# Patient Record
Sex: Female | Born: 1940
Health system: Southern US, Community
[De-identification: ages and names within clinical notes are randomized; demographics above are authoritative.]

## PROBLEM LIST (undated history)

## (undated) DIAGNOSIS — Z952 Presence of prosthetic heart valve: Principal | ICD-10-CM

## (undated) DIAGNOSIS — E1142 Type 2 diabetes mellitus with diabetic polyneuropathy: Secondary | ICD-10-CM

## (undated) DIAGNOSIS — I639 Cerebral infarction, unspecified: Secondary | ICD-10-CM

## (undated) DIAGNOSIS — G43909 Migraine, unspecified, not intractable, without status migrainosus: Secondary | ICD-10-CM

## (undated) DIAGNOSIS — M1990 Unspecified osteoarthritis, unspecified site: Secondary | ICD-10-CM

## (undated) DIAGNOSIS — N289 Disorder of kidney and ureter, unspecified: Secondary | ICD-10-CM

## (undated) DIAGNOSIS — I679 Cerebrovascular disease, unspecified: Secondary | ICD-10-CM

## (undated) DIAGNOSIS — Z8673 Personal history of transient ischemic attack (TIA), and cerebral infarction without residual deficits: Secondary | ICD-10-CM

## (undated) DIAGNOSIS — R011 Cardiac murmur, unspecified: Secondary | ICD-10-CM

## (undated) DIAGNOSIS — E11319 Type 2 diabetes mellitus with unspecified diabetic retinopathy without macular edema: Secondary | ICD-10-CM

## (undated) DIAGNOSIS — K5792 Diverticulitis of intestine, part unspecified, without perforation or abscess without bleeding: Secondary | ICD-10-CM

## (undated) DIAGNOSIS — R51 Headache: Secondary | ICD-10-CM

## (undated) DIAGNOSIS — I2510 Atherosclerotic heart disease of native coronary artery without angina pectoris: Secondary | ICD-10-CM

## (undated) DIAGNOSIS — K219 Gastro-esophageal reflux disease without esophagitis: Secondary | ICD-10-CM

## (undated) DIAGNOSIS — N189 Chronic kidney disease, unspecified: Secondary | ICD-10-CM

## (undated) DIAGNOSIS — I209 Angina pectoris, unspecified: Secondary | ICD-10-CM

## (undated) DIAGNOSIS — G459 Transient cerebral ischemic attack, unspecified: Secondary | ICD-10-CM

## (undated) DIAGNOSIS — I10 Essential (primary) hypertension: Secondary | ICD-10-CM

## (undated) DIAGNOSIS — E785 Hyperlipidemia, unspecified: Secondary | ICD-10-CM

## (undated) DIAGNOSIS — I350 Nonrheumatic aortic (valve) stenosis: Secondary | ICD-10-CM

## (undated) DIAGNOSIS — E119 Type 2 diabetes mellitus without complications: Secondary | ICD-10-CM

## (undated) DIAGNOSIS — F329 Major depressive disorder, single episode, unspecified: Secondary | ICD-10-CM

## (undated) DIAGNOSIS — I509 Heart failure, unspecified: Secondary | ICD-10-CM

## (undated) DIAGNOSIS — F32A Depression, unspecified: Secondary | ICD-10-CM

## (undated) DIAGNOSIS — E669 Obesity, unspecified: Secondary | ICD-10-CM

## (undated) HISTORY — DX: Personal history of transient ischemic attack (TIA), and cerebral infarction without residual deficits: Z86.73

## (undated) HISTORY — DX: Type 2 diabetes mellitus without complications: E11.9

## (undated) HISTORY — PX: ABDOMINAL HYSTERECTOMY: SHX81

## (undated) HISTORY — DX: Essential (primary) hypertension: I10

## (undated) HISTORY — DX: Cerebrovascular disease, unspecified: I67.9

## (undated) HISTORY — DX: Headache: R51

## (undated) HISTORY — DX: Type 2 diabetes mellitus with diabetic polyneuropathy: E11.42

## (undated) HISTORY — DX: Major depressive disorder, single episode, unspecified: F32.9

## (undated) HISTORY — PX: OTHER SURGICAL HISTORY: SHX169

## (undated) HISTORY — DX: Diverticulitis of intestine, part unspecified, without perforation or abscess without bleeding: K57.92

## (undated) HISTORY — DX: Obesity, unspecified: E66.9

## (undated) HISTORY — PX: COLONOSCOPY: SHX174

## (undated) HISTORY — DX: Depression, unspecified: F32.A

## (undated) HISTORY — DX: Migraine, unspecified, not intractable, without status migrainosus: G43.909

## (undated) HISTORY — DX: Type 2 diabetes mellitus with unspecified diabetic retinopathy without macular edema: E11.319

## (undated) HISTORY — DX: Hyperlipidemia, unspecified: E78.5

---
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
## 1997-06-25 ENCOUNTER — Encounter: Admission: RE | Admit: 1997-06-25 | Discharge: 1997-09-23 | Payer: Self-pay | Admitting: Endocrinology

## 1997-09-14 ENCOUNTER — Emergency Department (HOSPITAL_COMMUNITY): Admission: EM | Admit: 1997-09-14 | Discharge: 1997-09-14 | Payer: Self-pay | Admitting: Internal Medicine

## 1997-11-20 ENCOUNTER — Inpatient Hospital Stay (HOSPITAL_COMMUNITY): Admission: RE | Admit: 1997-11-20 | Discharge: 1997-11-24 | Payer: Self-pay | Admitting: Emergency Medicine

## 1998-06-05 ENCOUNTER — Emergency Department (HOSPITAL_COMMUNITY): Admission: EM | Admit: 1998-06-05 | Discharge: 1998-06-05 | Payer: Self-pay | Admitting: Emergency Medicine

## 1998-06-05 ENCOUNTER — Encounter: Payer: Self-pay | Admitting: Emergency Medicine

## 1998-07-17 ENCOUNTER — Emergency Department (HOSPITAL_COMMUNITY): Admission: EM | Admit: 1998-07-17 | Discharge: 1998-07-17 | Payer: Self-pay

## 1998-11-17 ENCOUNTER — Encounter (INDEPENDENT_AMBULATORY_CARE_PROVIDER_SITE_OTHER): Payer: Self-pay

## 1998-11-17 ENCOUNTER — Ambulatory Visit (HOSPITAL_COMMUNITY): Admission: RE | Admit: 1998-11-17 | Discharge: 1998-11-17 | Payer: Self-pay | Admitting: Internal Medicine

## 1998-11-25 ENCOUNTER — Ambulatory Visit (HOSPITAL_COMMUNITY): Admission: RE | Admit: 1998-11-25 | Discharge: 1998-11-25 | Payer: Self-pay | Admitting: Psychiatry

## 1998-12-03 ENCOUNTER — Inpatient Hospital Stay (HOSPITAL_COMMUNITY): Admission: RE | Admit: 1998-12-03 | Discharge: 1998-12-04 | Payer: Self-pay | Admitting: Interventional Radiology

## 1999-01-06 ENCOUNTER — Ambulatory Visit (HOSPITAL_COMMUNITY): Admission: RE | Admit: 1999-01-06 | Discharge: 1999-01-06 | Payer: Self-pay | Admitting: Psychiatry

## 1999-01-07 ENCOUNTER — Encounter: Payer: Self-pay | Admitting: Neurology

## 1999-01-07 ENCOUNTER — Inpatient Hospital Stay (HOSPITAL_COMMUNITY): Admission: RE | Admit: 1999-01-07 | Discharge: 1999-01-09 | Payer: Self-pay | Admitting: Neurology

## 1999-01-08 ENCOUNTER — Encounter: Payer: Self-pay | Admitting: Neurology

## 1999-02-18 HISTORY — PX: COLON RESECTION: SHX5231

## 1999-02-18 HISTORY — PX: COLOSTOMY: SHX63

## 1999-03-25 ENCOUNTER — Other Ambulatory Visit: Admission: RE | Admit: 1999-03-25 | Discharge: 1999-03-25 | Payer: Self-pay | Admitting: Obstetrics and Gynecology

## 1999-04-07 ENCOUNTER — Ambulatory Visit (HOSPITAL_COMMUNITY): Admission: RE | Admit: 1999-04-07 | Discharge: 1999-04-07 | Payer: Self-pay | Admitting: Interventional Radiology

## 1999-04-21 ENCOUNTER — Inpatient Hospital Stay (HOSPITAL_COMMUNITY): Admission: RE | Admit: 1999-04-21 | Discharge: 1999-04-22 | Payer: Self-pay | Admitting: Interventional Radiology

## 1999-05-06 ENCOUNTER — Ambulatory Visit (HOSPITAL_COMMUNITY): Admission: RE | Admit: 1999-05-06 | Discharge: 1999-05-06 | Payer: Self-pay | Admitting: Psychiatry

## 1999-06-01 ENCOUNTER — Inpatient Hospital Stay (HOSPITAL_COMMUNITY): Admission: EM | Admit: 1999-06-01 | Discharge: 1999-06-05 | Payer: Self-pay | Admitting: Emergency Medicine

## 1999-06-01 ENCOUNTER — Encounter: Payer: Self-pay | Admitting: Emergency Medicine

## 1999-06-04 ENCOUNTER — Encounter: Payer: Self-pay | Admitting: Endocrinology

## 1999-06-05 ENCOUNTER — Encounter: Payer: Self-pay | Admitting: Neurology

## 1999-06-05 ENCOUNTER — Encounter: Payer: Self-pay | Admitting: Endocrinology

## 1999-06-08 ENCOUNTER — Encounter: Admission: RE | Admit: 1999-06-08 | Discharge: 1999-07-02 | Payer: Self-pay | Admitting: Psychiatry

## 1999-06-16 ENCOUNTER — Emergency Department (HOSPITAL_COMMUNITY): Admission: EM | Admit: 1999-06-16 | Discharge: 1999-06-16 | Payer: Self-pay | Admitting: Emergency Medicine

## 1999-07-18 HISTORY — PX: COLOSTOMY CLOSURE: SHX1381

## 1999-10-25 ENCOUNTER — Encounter: Payer: Self-pay | Admitting: Obstetrics and Gynecology

## 1999-10-25 ENCOUNTER — Encounter: Admission: RE | Admit: 1999-10-25 | Discharge: 1999-10-25 | Payer: Self-pay | Admitting: Obstetrics and Gynecology

## 2000-01-03 ENCOUNTER — Encounter: Admission: RE | Admit: 2000-01-03 | Discharge: 2000-04-02 | Payer: Self-pay | Admitting: Neurosurgery

## 2000-02-27 ENCOUNTER — Encounter: Payer: Self-pay | Admitting: General Surgery

## 2000-02-27 ENCOUNTER — Inpatient Hospital Stay (HOSPITAL_COMMUNITY): Admission: EM | Admit: 2000-02-27 | Discharge: 2000-03-10 | Payer: Self-pay | Admitting: Emergency Medicine

## 2000-02-27 ENCOUNTER — Encounter (INDEPENDENT_AMBULATORY_CARE_PROVIDER_SITE_OTHER): Payer: Self-pay | Admitting: Specialist

## 2000-02-29 ENCOUNTER — Encounter: Payer: Self-pay | Admitting: General Surgery

## 2000-03-03 ENCOUNTER — Encounter: Payer: Self-pay | Admitting: Anesthesiology

## 2000-05-18 ENCOUNTER — Ambulatory Visit (HOSPITAL_COMMUNITY): Admission: RE | Admit: 2000-05-18 | Discharge: 2000-05-18 | Payer: Self-pay | Admitting: *Deleted

## 2000-05-18 ENCOUNTER — Encounter (INDEPENDENT_AMBULATORY_CARE_PROVIDER_SITE_OTHER): Payer: Self-pay | Admitting: Specialist

## 2000-06-19 ENCOUNTER — Ambulatory Visit (HOSPITAL_COMMUNITY): Admission: RE | Admit: 2000-06-19 | Discharge: 2000-06-19 | Payer: Self-pay | Admitting: *Deleted

## 2000-06-26 ENCOUNTER — Encounter: Payer: Self-pay | Admitting: General Surgery

## 2000-06-26 ENCOUNTER — Encounter: Admission: RE | Admit: 2000-06-26 | Discharge: 2000-07-08 | Payer: Self-pay | Admitting: General Surgery

## 2000-07-03 ENCOUNTER — Encounter: Payer: Self-pay | Admitting: General Surgery

## 2000-07-03 ENCOUNTER — Encounter (INDEPENDENT_AMBULATORY_CARE_PROVIDER_SITE_OTHER): Payer: Self-pay

## 2000-07-03 ENCOUNTER — Inpatient Hospital Stay (HOSPITAL_COMMUNITY): Admission: RE | Admit: 2000-07-03 | Discharge: 2000-07-08 | Payer: Self-pay | Admitting: General Surgery

## 2000-08-22 ENCOUNTER — Encounter: Payer: Self-pay | Admitting: Emergency Medicine

## 2000-08-22 ENCOUNTER — Emergency Department (HOSPITAL_COMMUNITY): Admission: EM | Admit: 2000-08-22 | Discharge: 2000-08-22 | Payer: Self-pay | Admitting: Emergency Medicine

## 2000-10-17 ENCOUNTER — Emergency Department (HOSPITAL_COMMUNITY): Admission: EM | Admit: 2000-10-17 | Discharge: 2000-10-17 | Payer: Self-pay | Admitting: Emergency Medicine

## 2000-11-06 ENCOUNTER — Encounter: Admission: RE | Admit: 2000-11-06 | Discharge: 2000-11-06 | Payer: Self-pay | Admitting: Obstetrics and Gynecology

## 2000-11-06 ENCOUNTER — Encounter: Payer: Self-pay | Admitting: Obstetrics and Gynecology

## 2001-02-08 ENCOUNTER — Other Ambulatory Visit: Admission: RE | Admit: 2001-02-08 | Discharge: 2001-02-08 | Payer: Self-pay | Admitting: Obstetrics and Gynecology

## 2001-05-15 ENCOUNTER — Encounter (INDEPENDENT_AMBULATORY_CARE_PROVIDER_SITE_OTHER): Payer: Self-pay | Admitting: Specialist

## 2001-05-15 ENCOUNTER — Ambulatory Visit (HOSPITAL_COMMUNITY): Admission: RE | Admit: 2001-05-15 | Discharge: 2001-05-15 | Payer: Self-pay | Admitting: *Deleted

## 2001-08-12 ENCOUNTER — Inpatient Hospital Stay (HOSPITAL_COMMUNITY): Admission: EM | Admit: 2001-08-12 | Discharge: 2001-08-13 | Payer: Self-pay | Admitting: *Deleted

## 2001-08-12 ENCOUNTER — Encounter: Payer: Self-pay | Admitting: *Deleted

## 2001-08-13 ENCOUNTER — Encounter: Payer: Self-pay | Admitting: Neurology

## 2001-08-20 ENCOUNTER — Encounter: Admission: RE | Admit: 2001-08-20 | Discharge: 2001-11-18 | Payer: Self-pay | Admitting: Endocrinology

## 2001-11-08 ENCOUNTER — Encounter: Admission: RE | Admit: 2001-11-08 | Discharge: 2001-11-08 | Payer: Self-pay | Admitting: Obstetrics and Gynecology

## 2001-11-08 ENCOUNTER — Encounter: Payer: Self-pay | Admitting: Obstetrics and Gynecology

## 2002-02-03 ENCOUNTER — Emergency Department (HOSPITAL_COMMUNITY): Admission: EM | Admit: 2002-02-03 | Discharge: 2002-02-03 | Payer: Self-pay | Admitting: Emergency Medicine

## 2002-02-27 ENCOUNTER — Encounter: Admission: RE | Admit: 2002-02-27 | Discharge: 2002-05-28 | Payer: Self-pay | Admitting: Endocrinology

## 2002-11-03 ENCOUNTER — Encounter: Payer: Self-pay | Admitting: Endocrinology

## 2002-11-03 ENCOUNTER — Ambulatory Visit (HOSPITAL_COMMUNITY): Admission: RE | Admit: 2002-11-03 | Discharge: 2002-11-03 | Payer: Self-pay | Admitting: Endocrinology

## 2002-11-12 ENCOUNTER — Encounter: Admission: RE | Admit: 2002-11-12 | Discharge: 2002-11-12 | Payer: Self-pay | Admitting: Obstetrics and Gynecology

## 2003-04-28 ENCOUNTER — Encounter: Admission: RE | Admit: 2003-04-28 | Discharge: 2003-04-28 | Payer: Self-pay | Admitting: Internal Medicine

## 2003-04-29 ENCOUNTER — Ambulatory Visit (HOSPITAL_COMMUNITY): Admission: RE | Admit: 2003-04-29 | Discharge: 2003-04-29 | Payer: Self-pay | Admitting: Unknown Physician Specialty

## 2003-11-13 ENCOUNTER — Encounter: Admission: RE | Admit: 2003-11-13 | Discharge: 2003-11-13 | Payer: Self-pay | Admitting: Endocrinology

## 2004-02-27 ENCOUNTER — Emergency Department (HOSPITAL_COMMUNITY): Admission: EM | Admit: 2004-02-27 | Discharge: 2004-02-27 | Payer: Self-pay | Admitting: Emergency Medicine

## 2004-06-22 ENCOUNTER — Inpatient Hospital Stay (HOSPITAL_COMMUNITY): Admission: EM | Admit: 2004-06-22 | Discharge: 2004-06-24 | Payer: Self-pay | Admitting: Emergency Medicine

## 2004-06-23 ENCOUNTER — Encounter: Payer: Self-pay | Admitting: Cardiovascular Disease

## 2004-06-23 ENCOUNTER — Ambulatory Visit: Payer: Self-pay | Admitting: Cardiovascular Disease

## 2005-01-27 ENCOUNTER — Encounter: Admission: RE | Admit: 2005-01-27 | Discharge: 2005-01-27 | Payer: Self-pay | Admitting: Endocrinology

## 2005-06-04 ENCOUNTER — Emergency Department (HOSPITAL_COMMUNITY): Admission: EM | Admit: 2005-06-04 | Discharge: 2005-06-04 | Payer: Self-pay | Admitting: Emergency Medicine

## 2005-08-10 ENCOUNTER — Encounter (INDEPENDENT_AMBULATORY_CARE_PROVIDER_SITE_OTHER): Payer: Self-pay | Admitting: Specialist

## 2005-08-10 ENCOUNTER — Ambulatory Visit (HOSPITAL_COMMUNITY): Admission: RE | Admit: 2005-08-10 | Discharge: 2005-08-10 | Payer: Self-pay | Admitting: *Deleted

## 2005-09-12 ENCOUNTER — Ambulatory Visit (HOSPITAL_COMMUNITY): Admission: RE | Admit: 2005-09-12 | Discharge: 2005-09-12 | Payer: Self-pay | Admitting: *Deleted

## 2006-01-31 ENCOUNTER — Encounter: Admission: RE | Admit: 2006-01-31 | Discharge: 2006-01-31 | Payer: Self-pay | Admitting: Endocrinology

## 2006-06-14 ENCOUNTER — Other Ambulatory Visit: Admission: RE | Admit: 2006-06-14 | Discharge: 2006-06-14 | Payer: Self-pay | Admitting: Endocrinology

## 2006-11-06 ENCOUNTER — Inpatient Hospital Stay (HOSPITAL_COMMUNITY): Admission: EM | Admit: 2006-11-06 | Discharge: 2006-11-07 | Payer: Self-pay | Admitting: Emergency Medicine

## 2006-11-06 ENCOUNTER — Encounter (INDEPENDENT_AMBULATORY_CARE_PROVIDER_SITE_OTHER): Payer: Self-pay | Admitting: *Deleted

## 2006-11-07 ENCOUNTER — Encounter (INDEPENDENT_AMBULATORY_CARE_PROVIDER_SITE_OTHER): Payer: Self-pay | Admitting: *Deleted

## 2007-02-02 ENCOUNTER — Encounter: Admission: RE | Admit: 2007-02-02 | Discharge: 2007-02-02 | Payer: Self-pay | Admitting: Endocrinology

## 2007-07-11 ENCOUNTER — Other Ambulatory Visit: Admission: RE | Admit: 2007-07-11 | Discharge: 2007-07-11 | Payer: Self-pay | Admitting: Endocrinology

## 2008-02-04 ENCOUNTER — Encounter: Admission: RE | Admit: 2008-02-04 | Discharge: 2008-02-04 | Payer: Self-pay | Admitting: Endocrinology

## 2009-02-05 ENCOUNTER — Encounter: Admission: RE | Admit: 2009-02-05 | Discharge: 2009-02-05 | Payer: Self-pay | Admitting: Endocrinology

## 2009-07-22 ENCOUNTER — Encounter: Payer: Self-pay | Admitting: Emergency Medicine

## 2009-07-22 ENCOUNTER — Inpatient Hospital Stay (HOSPITAL_COMMUNITY): Admission: EM | Admit: 2009-07-22 | Discharge: 2009-07-24 | Payer: Medicare PPO | Admitting: Internal Medicine

## 2009-07-22 ENCOUNTER — Ambulatory Visit: Payer: Self-pay | Admitting: Cardiovascular Disease

## 2009-07-23 ENCOUNTER — Encounter (INDEPENDENT_AMBULATORY_CARE_PROVIDER_SITE_OTHER): Payer: Self-pay | Admitting: Internal Medicine

## 2009-07-24 ENCOUNTER — Encounter (INDEPENDENT_AMBULATORY_CARE_PROVIDER_SITE_OTHER): Payer: Self-pay | Admitting: Internal Medicine

## 2009-08-04 ENCOUNTER — Encounter: Admission: RE | Admit: 2009-08-04 | Discharge: 2009-08-04 | Payer: Self-pay | Admitting: Endocrinology

## 2010-02-17 ENCOUNTER — Encounter: Payer: Self-pay | Admitting: Endocrinology

## 2010-02-26 ENCOUNTER — Other Ambulatory Visit: Payer: Self-pay | Admitting: Endocrinology

## 2010-02-26 DIAGNOSIS — Z1231 Encounter for screening mammogram for malignant neoplasm of breast: Secondary | ICD-10-CM

## 2010-03-09 ENCOUNTER — Ambulatory Visit
Admission: RE | Admit: 2010-03-09 | Discharge: 2010-03-09 | Disposition: A | Discharge status: Home / Self Care | Payer: Medicare Other | Source: Ambulatory Visit | Attending: Endocrinology | Admitting: Endocrinology

## 2010-03-09 DIAGNOSIS — Z1231 Encounter for screening mammogram for malignant neoplasm of breast: Secondary | ICD-10-CM

## 2010-04-04 LAB — GLUCOSE, CAPILLARY
Glucose-Capillary: 128 mg/dL — ABNORMAL HIGH (ref 70–99)
Glucose-Capillary: 146 mg/dL — ABNORMAL HIGH (ref 70–99)
Glucose-Capillary: 138 mg/dL — ABNORMAL HIGH (ref 70–99)
Glucose-Capillary: 153 mg/dL — ABNORMAL HIGH (ref 70–99)
Glucose-Capillary: 157 mg/dL — ABNORMAL HIGH (ref 70–99)
Glucose-Capillary: 168 mg/dL — ABNORMAL HIGH (ref 70–99)
Glucose-Capillary: 179 mg/dL — ABNORMAL HIGH (ref 70–99)
Glucose-Capillary: 219 mg/dL — ABNORMAL HIGH (ref 70–99)
Glucose-Capillary: 44 mg/dL — CL (ref 70–99)
Glucose-Capillary: 62 mg/dL — ABNORMAL LOW (ref 70–99)
Glucose-Capillary: 96 mg/dL (ref 70–99)

## 2010-04-04 LAB — CBC
HCT: 40.2 % (ref 36.0–46.0)
HCT: 38.3 % (ref 36.0–46.0)
Hemoglobin: 13.4 g/dL (ref 12.0–15.0)
Hemoglobin: 12.7 g/dL (ref 12.0–15.0)
MCH: 31.3 pg (ref 26.0–34.0)
MCH: 31.3 pg (ref 26.0–34.0)
MCHC: 33.3 g/dL (ref 30.0–36.0)
MCHC: 33.1 g/dL (ref 30.0–36.0)
MCV: 93.7 fL (ref 78.0–100.0)
MCV: 94.4 fL (ref 78.0–100.0)
Platelets: 258 10*3/uL (ref 150–400)
Platelets: 216 10*3/uL (ref 150–400)
RBC: 4.29 MIL/uL (ref 3.87–5.11)
RBC: 4.05 MIL/uL (ref 3.87–5.11)
RDW: 13.8 % (ref 11.5–15.5)
RDW: 13.7 % (ref 11.5–15.5)
WBC: 7.8 10*3/uL (ref 4.0–10.5)
WBC: 6.6 10*3/uL (ref 4.0–10.5)

## 2010-04-04 LAB — LIPID PANEL
Cholesterol: 84 mg/dL (ref 0–200)
HDL: 52 mg/dL (ref >39)
LDL Cholesterol: 23 mg/dL (ref 0–99)
Total CHOL/HDL Ratio: 1.6 RATIO
Triglycerides: 44 mg/dL (ref <150)
VLDL: 9 mg/dL (ref 0–40)

## 2010-04-04 LAB — DIFFERENTIAL
Basophils Absolute: 0 10*3/uL (ref 0.0–0.1)
Basophils Relative: 0 % (ref 0–1)
Eosinophils Absolute: 0.1 10*3/uL (ref 0.0–0.7)
Eosinophils Relative: 1 % (ref 0–5)
Lymphocytes Relative: 30 % (ref 12–46)
Lymphs Abs: 2.4 10*3/uL (ref 0.7–4.0)
Monocytes Absolute: 0.7 10*3/uL (ref 0.1–1.0)
Monocytes Relative: 9 % (ref 3–12)
Neutro Abs: 4.6 10*3/uL (ref 1.7–7.7)
Neutrophils Relative %: 60 % (ref 43–77)

## 2010-04-04 LAB — COMPREHENSIVE METABOLIC PANEL
ALT: 42 U/L — ABNORMAL HIGH (ref 0–35)
AST: 57 U/L — ABNORMAL HIGH (ref 0–37)
Albumin: 3.8 g/dL (ref 3.5–5.2)
Alkaline Phosphatase: 77 U/L (ref 39–117)
BUN: 12 mg/dL (ref 6–23)
CO2: 26 mEq/L (ref 19–32)
Calcium: 9.3 mg/dL (ref 8.4–10.5)
Chloride: 103 mEq/L (ref 96–112)
Creatinine, Ser: 1.33 mg/dL — ABNORMAL HIGH (ref 0.4–1.2)
GFR calc Af Amer: 48 mL/min — ABNORMAL LOW (ref >60)
GFR calc non Af Amer: 39 mL/min — ABNORMAL LOW (ref >60)
Glucose, Bld: 224 mg/dL — ABNORMAL HIGH (ref 70–99)
Potassium: 4.7 mEq/L (ref 3.5–5.1)
Sodium: 137 mEq/L (ref 135–145)
Total Bilirubin: 0.8 mg/dL (ref 0.3–1.2)
Total Protein: 7.5 g/dL (ref 6.0–8.3)

## 2010-04-04 LAB — POCT CARDIAC MARKERS
CKMB, poc: <1 ng/mL — ABNORMAL LOW (ref 1.0–8.0)
Myoglobin, poc: 111 ng/mL (ref 12–200)
Troponin i, poc: <0.05 ng/mL (ref 0.00–0.09)

## 2010-04-04 LAB — URINALYSIS, MICROSCOPIC ONLY
Bilirubin Urine: NEGATIVE
Glucose, UA: NEGATIVE mg/dL
Hgb urine dipstick: NEGATIVE
Ketones, ur: NEGATIVE mg/dL
Nitrite: NEGATIVE
Protein, ur: NEGATIVE mg/dL
Specific Gravity, Urine: 1.013 (ref 1.005–1.030)
Urobilinogen, UA: 0.2 mg/dL (ref 0.0–1.0)
pH: 6 (ref 5.0–8.0)

## 2010-04-04 LAB — BASIC METABOLIC PANEL
BUN: 15 mg/dL (ref 6–23)
CO2: 27 mEq/L (ref 19–32)
Calcium: 8.7 mg/dL (ref 8.4–10.5)
Chloride: 105 mEq/L (ref 96–112)
Creatinine, Ser: 1.49 mg/dL — ABNORMAL HIGH (ref 0.4–1.2)
GFR calc Af Amer: 42 mL/min — ABNORMAL LOW (ref >60)
GFR calc non Af Amer: 35 mL/min — ABNORMAL LOW (ref >60)
Glucose, Bld: 158 mg/dL — ABNORMAL HIGH (ref 70–99)
Potassium: 4.5 mEq/L (ref 3.5–5.1)
Sodium: 139 mEq/L (ref 135–145)

## 2010-04-04 LAB — PROTIME-INR
INR: 0.98 (ref 0.00–1.49)
Prothrombin Time: 12.9 seconds (ref 11.6–15.2)

## 2010-04-04 LAB — HEMOGLOBIN A1C
Hgb A1c MFr Bld: 6.4 % — ABNORMAL HIGH (ref <5.7)
Mean Plasma Glucose: 137 mg/dL — ABNORMAL HIGH (ref <117)

## 2010-04-04 LAB — HOMOCYSTEINE: Homocysteine: 11.3 umol/L (ref 4.0–15.4)

## 2010-04-04 LAB — APTT: aPTT: 21 seconds — ABNORMAL LOW (ref 24–37)

## 2010-06-01 NOTE — Consult Note (Signed)
NAMEREGENNA, TEDRICK NO.:  [id number]   MEDICAL RECORD NO.:  [id number]          PATIENT TYPE:  INP   LOCATION:  3025                         FACILITY:  [facility]   PHYSICIAN:  Shaune Pascal. Champey, M.D.DATE OF BIRTH:  31-Jul-1940   DATE OF CONSULTATION:  11/06/2006  DATE OF DISCHARGE:                                 CONSULTATION   REQUESTING PHYSICIAN:  Dr. Winfred Leeds.   REASON FOR ADMISSION:  CODE STROKE.   HISTORY OF PRESENT ILLNESS:  Ms. Notte is a 70 year old African-  American female with multiple medical problems including prior history  of right hemisphere of TIAs and infarcts, and status post right stent  status post occlusion in 2002, who presents after waking this morning  feeling fatigued and tired.  The patient went to the gym this morning  and noticed that she had numbness in the left side of her face and upper  extremity.  She also felt weak in her left arm and leg.  He husband also  noticed some slurred speech.  She also complained of some  lightheadedness and fatigue at that time.  She denied any headache,  vision changes, dizziness, vertigo, falls, or loss of consciousness.   PAST MEDICAL HISTORY:  Positive for:  1. TIA.  2. Stroke.  3. Hypertension.  4. Status post right MCA stent and occlusion.  5. Diabetes.  6. Hyperlipidemia.  7. Rheumatoid arthritis.   CURRENT MEDICATIONS:  Glucophage, Januvia, insulin, aspirin, Prilosec,  Pristiq, Actos, niacin, verapamil, Toprol XL, folic acid.   ALLERGIES:  NONE.   FAMILY HISTORY:  Positive for hypertension and diabetes.   SOCIAL HISTORY:  The patient lives with her husband.  Denies any tobacco  or alcohol use.   REVIEW OF SYSTEMS:  Positive as per HPI.  Review of systems negative as  per HPI, and greater than 6 other review of systems.   PHYSICAL EXAMINATION:  VITAL SIGNS:  Blood pressure 144/60, pulse is 68,  respirations 20, O2 sats 100%.  HEENT:  Normocephalic, atraumatic.  Extraocular  muscles are intact.  Face is symmetric.  NECK:  Supple.  No carotid bruits.  HEART:  Regular.  LUNGS:  Clear.  ABDOMEN:  Soft.  EXTREMITIES:  Show good pulses.  NEUROLOGICAL EXAMINATION:  The patient is awake, alert, and oriented.  Language is fluent.  No aphasia or dysarthria is noted.  The patient is  following commands appropriately.  Cranial nerves II-XII are grossly  intact.  MOTOR EXAMINATION:  Shows subtle left lower extremity weakness.  Question chronic.  The patient has a slight left lower extremity drift.  SENSORY EXAMINATION:  Within normal to light touch and pin-prick.  Cerebellar function is within normal Vanhouten-to-nose and heel-to-shin.  GAIT:  Not assessed secondary to safety.  NIH stroke scale is 1.   LABORATORY DATA:  CT of the head showed a right frontal hypodensity.  PT  was 13.3, INR is 1, PTT is 28.  WBC 8.2, hemoglobin 12.5, hematocrit is  38.8, platelets 324,000.   IMPRESSION:  This is a 70 year old with left-sided weakness and numbness  which is improving  or resolved.  Question transient ischemic attack  versus a small infarct.  The patient is not a candidate for intravenous  TPA.  Symptoms have resolved or improved.  Will admit the patient for  stroke workup including MRI/MRA, carotid Dopplers, 2D echocardiogram.  Will continue her on Aggrenox and aspirin.  Get physical therapy and  occupational therapy consults.  Check lipids and homocystine level.  Place the patient on deep venous thrombosis prophylaxis.   Will follow the patient while she is in the hospital.      Shaune Pascal. Estella Husk, [name]  Electronically Signed     DRC/MEDQ  D:  11/06/2006  T:  11/06/2006  Job:  [id number]

## 2010-06-01 NOTE — Discharge Summary (Signed)
[name], [name]                 ACCOUNT NO.:  [id number]   MEDICAL RECORD NO.:  [id number]          PATIENT TYPE:  INP   LOCATION:  3025                         FACILITY:  [facility]   PHYSICIAN:  Pramod P. Leonie Man, MD    DATE OF BIRTH:  10-04-1940   DATE OF ADMISSION:  11/06/2006  DATE OF DISCHARGE:  11/07/2006                               DISCHARGE SUMMARY   DISCHARGE DIAGNOSES:  1. Left paresthesias of unclear etiology, resolved in setting of      negative MRI.  2. New urinary tract infection.  3. History of transient ischemic attack.  4. History of stroke.  5. Hypertension.  6. Status post right middle cerebral artery stent with resulted      occlusion.  7. Diabetes.  8. Hyperlipidemia.  9. Rheumatoid arthritis   DISCHARGE MEDICATIONS:  1. Reglan 10 mg p.r.n.  2. Nortriptyline 10 mg a day.  3. Furosemide 20 mg a day.  4. Januvia 100 mg a day.  5. Aggrenox b.i.d.  6. Insulin 70/30 40 units q.p.m. 10 units q.a.m.  7. Glucophage ER 100 mg b.i.d.  8. Actos 50 mg a day.  9. Crestor 20 mg a day.  10.Niacin 500 mg a day.  11.__________ 50 mg a day.  12.Verapamil 120 mg a day.  13.Metoprolol 25 mg extended release once a day.  14.Folic acid 1 mg once a day.  15.Cipro 500 mg p.o. b.i.d. x3 days.   STUDIES PERFORMED:  1. CT of the brain on admission shows stable appearance including      chronic right frontal lobe infarct and right MCA stent.  No      evidence of acute infarct.  2. MRI of the brain shows no acute infarct.  3. Carotid Doppler last performed July 13, 2005, shows no      hemodynamically significant stenosis.  4. Carotid Doppler done this admission showed left 40-60% ICA      stenosis.  5. EKG shows sinus bradycardia with first-degree AV block, nonspecific      T-wave abnormality.  No significant change since last tracing.  6. 2-D echocardiogram not performed.  7. Transcranial Doppler not performed.   LABORATORY STUDIES:  Chemistry with glucose 105, otherwise  normal liver  function tests with AST 39, albumin 3.1, otherwise normal.  Cardiac  enzymes negative.  Cholesterol 80, triglycerides 38, HDL 47 and LDL 25.  Urinalysis shows large leukocyte esterase, few bacteria, 11-20 white  blood cell, 0-2 red blood cells and many squamous epithelial.  Homocystine is pending.  Urine drug screen is negative.  Alcohol level  less than 5.  Hemoglobin A1c 6.   HISTORY OF PRESENT ILLNESS:  [name] is a 70 year old African  American female with history of multiple medical problems including  prior history of right hemispheric TIAs and infarct, status post right  MCA stent with occlusion in 2005.  The patient presents after waking the  morning of admission feeling fatigued and tired.  The patient went to  the gym and noticed she had numbness in the left side of her face and  upper  extremity.  She also felt weak in her left arm and left leg.  Her  husband also noticed some slurred speech.  She complained of  lightheadedness and fatigue at that time.  She was brought to the  emergency in the room by EMS where CT of the brain showed no acute  abnormality.  She was admitted to the hospital for further stroke  evaluation.  She was not a TPA candidate secondary to symptoms having  resolved and improved.   HOSPITAL COURSE:  MRI was negative for acute stroke.  Her symptoms sound  marching and not stroke-like at all.  Her paresthesias are of an unknown  etiology, but likely not stroke related.  She was on aspirin and  Aggrenox at home.  Will continue Aggrenox only at time of discharge for  ongoing secondary stroke prevention.  She was started on Cipro for a  total of three days for a urinary tract infection diagnosed on  admission.  She has no therapy needs and is safe for discharge home.  She may return to work next Monday.   CONDITION ON DISCHARGE:  The patient alert and oriented x3.  No aphasia  or dysarthria.  No facial weakness.  No arm drift.  She has  decreased  fine motor movements in her left and she orbits her right arm over her  left arm.  She has decreased foot tap on the left.   DISCHARGE/PLAN:  1. Discharge home with family.  2. Aggrenox for secondary stroke prevention.  3. Ongoing risk factor control by primary care physician with goal LDL      less than [id number], goal systolic blood pressure less than 138, goal      hemoglobin A1c less than 6.5.  4. Follow-up with Dr. Jannifer Franklin in two to three months.      Burnetta Sabin, N.P.    ______________________________  Kathie Rhodes. Leonie Man, MD    SB/MEDQ  D:  11/07/2006  T:  11/08/2006  Job:  [id number]   cc:   [name], [name]  Waverly Ferrari, [name]

## 2010-06-04 NOTE — Discharge Summary (Signed)
[facility]  Patient:    [name], [name]                 MRN: [id number] Adm. Date:  [id number] Disc. Date: [id number] Attending:  Barbera Setters CC:         Jim Desanctis, [name]  Arleta Creek, [name]   Discharge Summary  FINAL DIAGNOSES: 1. Diverticulitis with perforation of transverse colon, status post emergent    resection with colostomy. 2. Insulin-dependent diabetes mellitus. 3. History of multiple right middle cerebral angioplasties and stroke with    indwelling right middle cerebral artery stent. 4. Hypertension. 5. Gastroesophageal reflux disease.  OPERATION PERFORMED:  Exploratory laparotomy with extensive lysis of adhesions, resection and closure of colostomy, mobilization of splenic flexion of colon.  DATE OF SURGERY:  July 03, 2000.  HISTORY OF PRESENT ILLNESS:  This is a 70 year old black female who presented four months ago with acute abdominal pain and ultimately had to undergo exploratory laparotomy and transverse colon resection with colostomy and closure of the distal transverse colon Jeanette Caprice resection).  Pathology showed a benign inflammatory process with focal perforation and extensive pericolonic acute inflammation, and focal abscess formation of the mesentery, but no evidence of malignancy.  It was thought that this was most likely atypical diverticulitis, or possibly a foreign body perforation.  She recovered from that surgery uneventfully.  She has undergone evaluation, including colostomy per rectum and colonoscopy per her ostomy as well, and both the proximal and distal segments of colon looked normal.  She requests that her colostomy be closed.  She has undergone bowel prep at home, and is brought to the hospital and the operating room electively for elective colostomy closure.  For details of her past history, social history, family history, please see the detailed admission date.  PHYSICAL  EXAMINATION:  GENERAL:  Pleasant, alert, cooperative female in no acute distress.  LUNGS:  Clear to auscultation.  HEART:  Regular rate and rhythm, no murmur.  ABDOMEN:  Soft.  Well healed midline scar, no hernias.  Healthy colostomy in the right upper quadrant, no hernias.  EXTREMITIES:  No edema, good pulses.  HOSPITAL COURSE:  On the day of admission the patient was taken to the operating room and underwent exploratory laparotomy.  We found that she had extensive adhesions throughout her abdomen and essentially had no free peritoneal space.  We were able to take down all the adhesions after a lengthy dissection, and then we were able to mobilize the proximal colon and colostomy, and mobilize the distal colon, and the splenic flexure, bring them together, and perform and end-to-end hand sewn anastomosis.  Postoperatively, the patient did fairly well.  She had no major problems.  She had a little bit of ileus for a few days, but that resolved promptly.  She was on the Adelor drug study protocol, and had no problems related to that.  On the third postoperative day we were able to offer her some solid food and to restart all of her oral medications.  Her diabetes was monitored, and she was given sliding scale insulin plus b.i.d. NPH insulin, and that kept her blood sugars under good control.  She was able to be discharged on July 08, 2000.  At that time, her nausea had resolved, she had bowel movements, was tolerating liquids and solids, was afebrile.  Abdominal exam was unremarkable with a healing incision, no signs of infection.  She went home on July 08, 2000.  She was asked  to return to see me in the office in three to six days.  DISCHARGE MEDICATIONS:  1. Tylox for pain.  2. Insulin 73 units q.a.m. and 30 units at dinnertime.  3. Glucophage 1000 mg b.i.d.  4. Glucotrol XL 2 mg b.i.d.  5. Plavix 75 mg p.o. q.d.  6. Aspirin 325 mg q.d.  7. Demadex 20 mg q.d.  8. Verapamil  SA 120 mg q.d.  9. Paxil 30 mg q.d. 10. Prevacid 30 mg q.d. 11. Premarin 0.9 mg p.o. q.d. 12. Zocor 40 mg q.d. DD:  07/21/00 TD:  07/21/00 Job: [id number]

## 2010-06-04 NOTE — Discharge Summary (Signed)
[place]  Patient:    [name], [name]                        MRN: [id number] Adm. Date:  [id number] Disc. Date: [id number] Attending:  Harlan Stains Courson                           Discharge Summary  ADDENDUM:  PATIENTS ADDRESS:  [address] [place], [place], [place]  [place] OF BIRTH:  August 25, 1940.  HOSPITAL COURSE:  The patient has known history of diabetes mellitus, hypertension, and stroke, with right middle cerebral artery stenosis status post angioplasty x 2 with restenosis.  She has been on Coumadin and aspirin in the hospital without recurrent neurological symptoms.  She did have symptoms and signs of congestive heart failure and, in the hospital, was seen in consultation by Dr. Gareth Eagle, her personal physician.  He recommended cardiology consult with Dr. Concepcion Elk, who saw the patient Jun 04, 1999.  A 2-D echocardiogram was obtained which was normal, showed normal LV function, and no valvular disease.  Because of her congestive heart failure symptoms, she had two chest x-rays, one on Jun 04, 1999, showing evidence of bilateral lower lobe atelectasis and one on Jun 05, 1999, showing right lower lobe atelectasis.  Dr. Pauline Aus admits to the question of pulmonary emboli, and a CT scan of the chest was obtained on Jun 05, 1999, showing no evidence of pulmonary emboli.  In the hospital, she had no further shortness of breath on Jun 05, 1999.  Enzymes were negative, and an EKG showed no acute changes.  Her INR was therapeutic, and she was on p.o. diuretics and potassium replacement. It was decided to let the patient go home for further treatment as an outpatient, with repeat pro time on Monday.  IMPRESSION: 1. Right middle cerebral artery stenosis syndrome with right brain transient    ischemic attacks, code 425.9. 2. Diabetes mellitus, code 250.60. 3. Hypertension, code 796.2. 4. Congestive heart  failure.  PLAN:  Discharge the patient on Coumadin 5 mg q.d. over the weekend, with a PT on Monday.  Plavix 75 mg daily.  Insulin, Zocor, Prevacid, Paxil, Glucotrol, Glucophage, Covera, and Premarin as she was taking prior to her hospitalization.  An 1800 calorie ADA diet.  PT and OT as an outpatient. Recheck her PT on Monday.  CONDITION ON DISCHARGE:  She is discharged improved from her prehospital status. DD:  06/05/99 TD:  06/08/99 Job: [id number]

## 2010-06-04 NOTE — Procedures (Signed)
[name]  Patient:    [name], [name] Visit Number: [id number] MRN: [id number]          Service Type: END Location: ENDO Attending Physician:  Jim Desanctis Dictated by:   Jim Desanctis, [name] Proc. Date: 05/15/01 Admit Date:  05/15/2001   CC:         Edsel Petrin. Dalbert Batman, [name]   Procedure Report  PROCEDURE:  Colonoscopy.  SURGEON:  Jim Desanctis, [name]  INDICATIONS:  Rectal bleeding and iron-deficiency anemia.  ANESTHESIA:  None further given.  DESCRIPTION OF PROCEDURE:  With the patient mildly sedated in the left lateral decubitus position, the Olympus videoscopic colonoscope was inserted in the rectum, and passed under direct vision to the cecum, identified by the ileocecal valve and appendiceal orifice, both of which were photographed and normal.  We entered into the terminal ileum which also appeared normal and was photographed.  From this point, the colonoscope was then slowly withdrawn, taking circumferential views of the entire colonic mucosa, stopping only at the anastomotic area where sutures were seen, and in this area, the mucosa was somewhat heaped up and inflamed, and somewhat friable, and this area was photographed and biopsied  Subsequently, then the endoscope was then withdrawn, taking circumferential views of the remaining colonic mucosa, stopping in the rectum which appeared normal on direct and retroflexed view and showed hemorrhoids on pull through the anal canal.  The patients vital signs and pulse oximeter remained stable.  The patient tolerated the procedure well and without apparent complications.  FINDINGS:  Inflammatory tissue at the anastomotic site from the patients previous colostomy anastomosis.  Additionally, hemorrhoids were noted, otherwise an unremarkable examination.  PLAN:  Await biopsy report.  See endoscopy note for further details as well. Will have the patient follow up with me as an outpatient.  At this  point, it is possible that the patient has noted bleeding from hemorrhoids or possibly from the friable anastomotic area. Dictated by:   Jim Desanctis, [name] Attending Physician:  Jim Desanctis DD:  05/15/01 TD:  05/16/01 Job: [id number]

## 2010-06-04 NOTE — Consult Note (Signed)
[facility]  Patient:    [name], [name]                       MRN: [id number] Proc. Date: 02/28/00 Attending:  [name], [name] CC:         Edsel Petrin. Dalbert Batman, [name]  Arleta Creek, [name]   Consultation Report  HISTORY OF PRESENT ILLNESS:  [name] is a 70 year old right-handed black female -- born [birth year] -- with a history of cerebrovascular disease with a right middle cerebral artery stenosis, status post stenting procedure done in November of 2001.  This patient has done well following the stenting procedure, which was done at [facility].  This patient has been treated with aspirin and Plavix following this and has been neurologically stable.  This patient, however, presents at this time with left lower quadrant pain, has elevated WBC count in the 16,000 range upon admission and CT scan of the abdomen that shows evidence of a thickened abnormal loop of bowel in the left abdomen that could be consistent with an atypical diverticulitis or perforated bowel.  Patient may require surgery.  Reason for consultation is to determine what the best mode of treatment is in terms of anticoagulant perioperatively.  PAST MEDICAL HISTORY  1. History of cerebrovascular disease, status post right middle cerebral     artery stent in November 2001.  2. Left lower quadrant pain in abdomen -- possible diverticulitis.  3. History of diabetes.  4. History of pericarditis in the past.  5. History of gastroesophageal reflux disease.  6. History of hysterectomy.  7. History of hypertension.  MEDICATIONS PRIOR TO ADMISSION  1. Premarin 0.9 mg daily.  2. Prevacid 30 mg b.i.d.  3. Zocor 40 mg daily.  4. Plavix 75 mg a day.  5. Hydrocodone p.r.n.  6. Glucotrol 10 mg one twice a day.  7. Verapamil 120 mg daily.  8. Plavix 30 mg daily.  9. Demadex 20 mg daily. 10. Glucophage 1000 mg b.i.d. 11. Enteric-coated aspirin one a day. 12.  Novolin insulin 70/30, 9 units q.h.s.  ALLERGIES:  Patient has no known allergies except to [place].  SOCIAL HISTORY:  Does not smoke or drink.  Patient is married.  Patient lives in the [place] area.  FAMILY HISTORY:  Not obtained.  REVIEW OF SYSTEMS:  Review of systems is notable for no recent fevers or chills.  Patient does note some abdominal pain.  Denies nausea, vomiting or diarrhea.  Denies any new numbness or weakness on the face, arms or legs. Does feel weak in general.  Has not had any visual fields changes or speech changes, swallowing problems and denies any blackout episodes or difficulty with ambulation.  PHYSICAL EXAMINATION  VITALS:  Blood pressure is 137/69.  Heart rate is 80.  Respiratory rate 20. Temperature 99.9.  GENERAL:  This patient is a fairly well-developed black female who is alert and cooperative at the time of examination.  HEENT:  Head is atraumatic.  Eyes:  Pupils are equal, round and reactive to light.  Disks are flat bilaterally.  NECK:  Supple.  No carotid bruits noted.  RESPIRATORY:  Clear to auscultation and percussion.  CARDIOVASCULAR:  Regular rate and rhythm without obvious murmurs or rubs noted.  EXTREMITIES:  Without significant edema.  NEUROLOGIC:  Cranial nerves as above.  Facial symmetry is present.  Patient has good sensation to pinprick and soft touch bilaterally.  Patient  has good strength of facial muscles and the muscles with head-turning and shoulder shrugs bilaterally.  Speech is well-enunciated and not aphasic.  Motor testing reveals 5/5 strength in all fours.  Good and symmetric motor tone is noted throughout.  Sensory testing is intact to pinprick, soft touch and vibratory sensation throughout.  Patient has good Scheel-to-nose-to-Pai and heel-to-shin.  Gait was not tested.  Deep tendon reflexes again are symmetric and normal.  Toes are downgoing bilaterally.  LABORATORY AND X-RAY FINDINGS:   Laboratory values are notable for WBC count on admission of 16.0, hemoglobin 9.9, hematocrit 32.5, MCV of 75.1, platelets of 545,000.  Urinalysis reveals specific gravity of 1.021, pH of 6.0; otherwise, unremarkable.  Sodium 140, potassium 3.7, chloride of 103, CO2 of 27, glucose of 69, BUN of 16, creatinine 0.9, calcium of 8.6, total protein 7.5, albumin of 3.2, AST of 10, ALT 7, alkaline phosphatase 75, bilirubin of 0.3, amylase of 33, lipase of 13.  CT scan of the abdomen shows bowel wall thickening with loop of small bowel and may possibly be atypical diverticulitis, tumor involvement or atypical Crohns or perforated bowel.  No definite abscess was seen.  EKG reveals normal sinus rhythm, voltage criteria for left ventricular hypertrophy, nonspecific T wave abnormalities and heart rate is 79.  IMPRESSION  1. History of left lower quadrant pain with abnormal bowel by CT.  2. Cardiovascular disease.  3. Right middle cerebral artery stenosis, status post stenting procedure,     November of 2001.  Prior to admission, this patient has been treated with aspirin and Plavix alone.  The effects of these antiplatelet agents will last five to seven days once discontinued.  I would probably continue aspirin and Plavix until just prior to surgery and if the patient is n.p.o. for more than three or four days following surgery, could use intravenous heparin postoperatively if needed; I am not clear that I would pursue using heparin prior to surgery.  Will follow this patient clinically while in house. DD:  02/28/00 TD:  02/29/00 Job: [id number]

## 2010-06-04 NOTE — Procedures (Signed)
HISTORY OF PRESENT ILLNESS:  This is a patient of C. Floyde Parkins, [name],  admitted to the Neurology Service primarily on June 23, 2004.  The patient is  a 70 year old right handed female, fully oriented with a complaint of  weakness and numbness on the left side associated with headache.  She has a  history of an MCA stent, and a history of stroke.   MEDICATIONS:  1.  Glucotrol.  2.  Insulin.  3.  Zocor.  4.  Zoloft.  5.  Actos.  6.  Aspirin.  7.  Aggrenox.   DESCRIPTION OF PROCEDURE:  A posterior dominant rhythm is seen admitting  over both hemispheres with a frequency of 10 Hertz.  There is focal slowing  noted over the frontal and parietal area which could be an indication of a  previous underlying anatomical lesion.  Heart rate appears regular at 62-68  beats per minute.  Photic stimulation was performed to the last third of the  study and did not lead to photic __________ until frequencies exceeded 7  Hertz.  Also, there is upward frequencies, continued photic driving noticed.  The frontal, central and parietal regions of the right side do not  participate in this process.   No epileptiform discharge was seen.   CONCLUSION:  This is a abnormal EEG due to focal slowing over F4 to P4  involving the central right cortex of the brain, and possibly the location  of the previous stroke.       [id number]  D:  06/24/2004 17:40:27  T:  06/25/2004 06:40:51  Job #:  [id number]

## 2010-06-04 NOTE — Op Note (Signed)
[facility]  Patient:    [name], [name]                 MRN: [id number] Proc. Date: 07/03/00 Adm. Date:  [id number] Attending:  Barbera Setters CC:         Jim Desanctis, [name]  Arleta Creek, [name]  [name], [name]   Operative Report  PREOPERATIVE DIAGNOSIS:  Functioning transverse colostomy, status post emergent resection for diverticulitis.  POSTOPERATIVE DIAGNOSIS:  Functioning transverse colostomy, status post emergent resection for diverticulitis.  OPERATION PERFORMED: 1. Exploratory laparotomy with extensive lysis of adhesions. 2. Resection and closure of colostomy. 3. Mobilization of splenic flexure of colon.  SURGEON:  Edsel Petrin. Dalbert Batman, [name]  FIRST ASSISTANT:  Shellia Carwin, [name]  OPERATIVE INDICATIONS:  This is a 70 year old black female, who underwent emergent exploratory laparotomy, transverse colon resection with colostomy, and closure of the distal transverse colon four months ago.  Pathology showed a benign inflammatory process with a focal perforation and extensive pericolonic acute inflammation and focal abscess formation of the mesentery, with no evidence of malignancy.  It was thought that this was most likely an atypical diverticulitis.  She recovered from this surgery.  She has undergo colonoscopy of the proximal and distal colon, and no abnormalities were found. She is well and desires closure of her colostomy.  She has undergo two day bowel prep at home and is brought to the operating room electively.  OPERATIVE FINDINGS:  The patient had extensive adhesions, and it took me at least 45 minutes to take all of these adhesions down before I could begin the process of colostomy closure.  Basically the entire peritoneal cavity had been obliterated except for the space above the liver and above the spleen.  There was no abscess or exudate or active inflammation.  OPERATIVE TECHNIQUE:  Following the  induction of general endotracheal anesthesia, the patients abdomen was prepped and draped in a sterile fashion. A midline laparotomy was performed, excising the old scar.  Prior to prepping and draping, I had placed a pursestring suture of 0 silk in the colostomy in the right upper quadrant.  Dissection was carried down through the subcutaneous tissue and the fascia. We very carefully entered the subfascial space and found that the small bowel was extensively adherent to the undersurface of the fascia.  We spent at least 45 minutes to one hour taking all of the adhesions down.  This was done without any significant incident, although a couple of serosal injuries were closed with 3-0 silk sutures.  We mobilized the small bowel and omentum completely away from the abdominal wall, although we felt that it would be unwise to take all of the small bowel adhesions apart since the patient had no symptoms of obstruction, and it looked like that would be a formidable procedure.  We took the dissection into the left upper quadrant and in the left paracolic gutter.  We identified the descending colon, divided the lateral peritoneal attachments, and mobilized the descending colon up.  We then mobilized the splenic flexure.  Some of the attachments were divided between clamps and ligated with 2-0 silk ties.  Ultimately we had the descending colon and the splenic flexure mobilized, and we identified the distal transverse colon.  We had to spend some time taking adhesions down.  The end of the transverse colon where we had stapled it off was densely adherent to the omentum and rather than disrupt the mesentery, we chose to  create an anastomosis just distal to the stapled end.  We then mobilized the colostomy.  We dissected all the way around the colostomy circumferentially from the peritoneal side.  We then made a transverse elliptical incision and excised the colostomy from the subcutaneous tissue.   We then dissected the colostomy from the fascia and returned it to the abdominal cavity.  We resected about two inches of the colostomy to freshen up the ends.  At this point, we had healthy colon proximally and healthy colon distally.  We were able to bring the proximal and distal ends across anteriorly by creating an anastomosis without any tension whatsoever. Anastomosis was created between the end of the proximal colon and the antimesenteric side of the distal transverse colon in a fashion with interrupted sutures of 2-0 silk and 3-0 silk.  Corner sutures of 2-0 silk were placed to hold the anastomosis in place.  The distal colon was opened with electrocautery.  Corner stitches of 3-0 silk were placed to evert the colon. Mattress suture of 3-0 silk was placed in the middle of the back wall of the anastomosis.  Interrupted sutures were placed to complete the back wall anastomosis in the usual fashion.  Corner sutures were placed in each end to turn in and invert the corners.  Anterior of the anastomosis was created with interrupted inverting sutures of 3-0 silk.  We inspected the anastomosis in front and back.  We felt that this was a healthy anastomosis without tension with good viable pink bowel on either side.  There was no apparent defect.  We then irrigated the abdominal cavity copiously with about three liters of saline.  We found no bleeding anywhere from any of the dissection sites.  We closed the colostomy site in two layers.  The posterior rectus sheath was closed with a running suture of 2-0 Vicryl.  The anterior rectus sheath was closed with a running suture of #1 PDS.  The wound was irrigated with saline, and the skin was closed with 3-4 skin staples, and Telfa wicks were placed in between.  We closed the midline fascia with a running suture of #1 PDS and the skin with  skin staples.  Clean bandages were placed and the patient taken to the recovery room in stable  condition.  Estimated blood loss was about 300 cc. Complications were none.  Sponge, needle and instrument counts were correct. DD:  07/03/00 TD:  07/03/00 Job: [id number]

## 2010-06-04 NOTE — Consult Note (Signed)
[place]  Patient:    [name], [name]                        MRN: [id number] Proc. Date: 06/04/99 Adm. Date:  [id number] Attending:  Harlan Stains Courson Dictator:   Rolena Infante, P.A.                          Consultation Report  DATE OF BIRTH:  11-Jun-1940  ATTENDING:  Lonzo Candy, [name]  CHIEF COMPLAINT:  Congestive heart failure.  HISTORY OF PRESENT ILLNESS:  This 70 year old female with history of right middle artery stenosis with angioplasty with Dr. Estanislado Pandy.  She has had restenosis with unsuccessful repeat angioplasty on April 21, 1999.  She was on Plavix 75 mg and aspirin daily.  She was admitted on Jun 01, 1999 after recurrent stroke or TIA symptoms.  Weakness was noted at her left side, particularly her left leg.  She had been started back on her Coumadin as outpatient basis.  This event occurred while on aspirin as well as Coumadin, with an INR greater than 5.  CT scan was done in the ER, does not show any evidence of hemorrhage or acute process.  Plavix was re-added with an MRI and MRA obtained.  She has had onset of shortness of breath.  On May 17, chest x-ray showed cardiomegaly with mild congestive heart failure, bilateral pleural effusions.  EKG showed normal sinus rhythm, P wave inversions. Cardiac enzymes were obtained returning as essentially negative, with total CK at 125, CK-MB 0.7, relative index 0.6.  A 2-D echocardiogram on September 09, 1998 showed a small to moderate pericardial effusion.  This has been followed as an outpatient.  There were no echoed findings to suggest any tamponade. Old EKGs were also added to the chart to compare from September 14, 1998.  Today, she states her breathing is much better.  She is also very anxious for discharge.  PAST MEDICAL HISTORY:  Also noted for diabetes, insulin-dependent; CVA with hypertension; chronic anticoagulation; coronary artery disease; hyperlipidemia.   Heart catheterization on November 20, 1997 was noted to show normal coronary arteries with normal left ventricular function.  She also had hysterectomy in 1979.  SOCIAL HISTORY:  She was an Automotive engineer.  She is currently on disability from the main office.  Also, no alcohol use, no tobacco use.  FAMILY HISTORY:  Positive for stroke, high blood pressure.  Maternal uncle also was noted to have a stroke.  ALLERGIES:  SULFA.  MEDICATIONS:  Glucophage 1000 mg b.i.d., Glucotrol 10 mg q.d., Prevacid b.i.d., Demadex is held now with Lasix 40 mg daily, coated aspirin daily, Coumadin 5 mg on admission, Premarin 0.9 mg q.d., Zocor 40 q.d., Paxil 20 q.d., Covera 180 mg q.d., Novolin 70/30 insulin 30 units q.a.m.  REVIEW OF SYSTEMS:  States she has had regular dental checkups, states that she did have the caps on her lower right jaw, were problems after a root canal was done, and that she have to see an oral surgeon regarding the teeth on the lower left side.  She does wear prescription lenses.  These are checked regularly, as well.  She has not had a recent dental cleaning due to being on Coumadin.  She has a history of acute pericarditis in the past.  Also hypertension.  She denies any difficulty breathing now, states that she is resting comfortably.  States she had a left-sided weakness, both upper and lower extremities.  Diabetes is known, but blood sugars have remained stable. She denies any problems with bowel movement; last bowel movement was on May 31, 1999 before admission.  Denies any urinary symptoms.  Denies any bone, joint, or muscle difficulty.  Does have some stomach bruising from insulin shots that she states due to her Coumadin being too high.  Otherwise, she describes no other specific problems with pain, no change in appetite.  She has noted some weight loss now that she has been placed on a different diuretic.  PHYSICAL EXAMINATION:  VITAL SIGNS:  Temperature  97.5, pulse 76, respirations 22, blood pressure 160/78.  CBGs today are 52, 92, and 145.  Weight is 187.5 pounds.  She had a decrease of 6 pounds since yesterdays weight of 193.  GENERAL:  This is an alert, healthy-appearing older female, at the present time in no acute distress.  She is well-dressed, well cared for, is sitting up in a chair in the room.  HEENT:  Pupils are equal, extraocular movements intact, does have prescription lenses on.  Face is equal.  Teeth are in good repair with dental work noted at right lower jaw.  NECK:  Supple.  There is no JVD, no bruits, no adenopathy.  CHEST:  Clear to auscultation.  No sign of any respiratory distress.  HEART:  Regular rate and rhythm, no murmurs.  PMI is normal.  ABDOMEN:  Soft, active bowel sounds, nontender.  EXTREMITIES:  Still has some trace pretibial edema, left greater than right. Full range of motion.  Skin is intact.  There is no cyanosis or clubbing noted.  NEUROLOGIC:  Grossly intact.  Speech is clear.  Gait is not tested.  IMPRESSION: 1. Congestive heart failure with history of pericardial effusion.  A 2-D    echocardiogram is to be obtained to assess LV function to see if there    are any changes from previous. 2. Hypertension.  Restart present medications to obtain good control.  As an    outpatient, her blood pressure was in the 144/80 range. 3. Hyperlipidemia.  She is to continue on her Zocor.  She had an excellent    lipid profile on March 09, 1999 with total cholesterol 169,    triglycerides 135, HDL 78, LDL 64. DD:  06/04/99 TD:  06/05/99 Job: [id number]

## 2010-06-04 NOTE — Procedures (Signed)
[facility]  Patient:    LORALEE, CACCHIONE                        MRN: [id number] Proc. Date: 05/18/00 Adm. Date:  [id number] Attending:  Jim Desanctis                           Procedure Report  PROCEDURE:  Colonoscopy through the ostomy.  INDICATION FOR PROCEDURE:  A patient with previous perforation colon. Colonoscopy done at this time to determine appropriateness of anastomosis.  ANESTHESIA:  Demerol 100, Versed 10 mg was given total for the two procedures.  DESCRIPTION OF PROCEDURE:  With the patient mildly sedated in recumbent position, the Olympus pediatric  colonoscope PCF-140L was inserted through the ostomy and passed under direct vision through the colon. However, the prep was subadequate and visualization became too poor to advance the scope so therefore I do not believe we reached the terminus of the colon in the cecum, therefore we elected to stop at this point and slowly withdraw the colonoscope taking circumferential views of the entire colonic mucosa visualized until the endoscope had been removed through the ostomy bag.  The patients vital signs and pulse oximeter remained stable. The patient tolerated the procedure well without apparent complications.  FINDINGS:  Poor precludes adequate examination.  PLAN:  Repeat examination of this portion of the colon with adequate preparation. DD:  05/18/00 TD:  05/18/00 Job: [id number]

## 2010-06-04 NOTE — Procedures (Signed)
[facility]  Patient:    [name], [name]                        MRN: [id number] Proc. Date: 05/18/00 Adm. Date:  [id number] Attending:  Jim Desanctis CC:         Edsel Petrin. Dalbert Batman, [name]   Procedure Report  PROCEDURE:  Sigmoidoscopy via the rectum to view the rectal pouch.  ANESTHESIA:  Demerol 100, Versed 10 mg was given intravenously in divided doses.  DESCRIPTION OF PROCEDURE:  With the patient mildly sedated in the left lateral decubitus position, the Olympus videoscopic pediatric colonoscope PCF-140 was inserted in the rectum and passed through that rather tortuous remnant to the terminus. At the proximal end of the terminus, there was stool coating the mucosa which would not wash very well but the mucosa to that point appeared quite normal although quite friable. This may have been due to the torsion on the this area that was required to traverse it but no gross lesions were seen. Biopsies were taken as we withdrew taking circumferential views of the mucosa visualized. There was some trauma to the area due to the colonoscopy which was unavoidable. The patient tolerated the procedure well without apparent complications.  FINDINGS:  Friability and tortuosity of the rectal remnant to an area somewhere in the presumed descending colon.  PLAN:  Proceed to colonoscopy through the ostomy. DD:  05/18/00 TD:  05/18/00 Job: [id number]

## 2010-06-04 NOTE — Discharge Summary (Signed)
NAME:  [name], PEINADO NO.:  [id number]   MEDICAL RECORD NO.:  [id number]                   PATIENT TYPE:  INP   LOCATION:  3008                                 FACILITY:  [facility]   PHYSICIAN:  Orson Eva, [name]            DATE OF BIRTH:  1940/11/21   DATE OF ADMISSION:  08/12/2001  DATE OF DISCHARGE:  08/13/2001                                 DISCHARGE SUMMARY   HISTORY:  For complete details of the Chief Complaint, History of Present  Illness, Past Medical History, and Physical Examination please refer to Dr.  Hart Robinsons admission History and Physical.   Briefly, [name] is a 70 year old right-handed female with a history of  cerebrovascular disease, hypertension, and diabetes.  She has a history of  right middle cerebral artery focal stenosis with infarct with left-sided  weakness and numbness in 1999.  She subsequently underwent placement of a  right MCA stent either in 2000 or 2001 and had a brief symptom-free period  and then began having intermittent left-sided symptoms again.  Repeat  arteriogram in August 2002 showed occlusion of her stent.  She was placed on  Coumadin for a while then switched to aspirin and Plavix.  She continued to  have some intermittent left-sided symptoms and on the day prior to  admission, had an episode of left-sided numbness and weakness.  Her symptoms  subsided substantially but not completely.  She presented to the emergency  room at which time she still had a subjective decrease in sensation in her  left leg and felt that it was somewhat weak.  The patient was evaluated in  the emergency room and was found to have essentially normal vital signs with  a blood pressure of 152/67.  She is alert and oriented with normal language  and cognition.  The patient's sensation was mildly diminished on the left.  Facies was symmetric.  There was no drift in the upper or lower extremities.  She had normal  strength throughout the upper and lower extremities.  Reflexes were 1+ with downgoing toes.  Sensory examination revealed a  subjective decrease to sensation in multiple modalities in the left arm with  patchy areas of decreased sensation in the left face and leg.  The patient  was admitted with a diagnosis of recurrent transient ischemic attack in the  right brain with left body symptoms but minimal findings on examination.  Plan was to recheck an MRI scan of the brain with diffusion-weighted images,  MR angiogram, and carotid ultrasounds and consider treatment with Aggrenox.   LABORATORY FINDINGS:  Admission CBC and differential showed white cell count  7.8, hemoglobin 11.5, and hematocrit 35.5.  Platelet count was 322,000.  There were 77% neutrophils, 18% lymphocytes, and 4% monocytes.  PT and PTT  were 13.4 and 26 seconds, respectively.  Comprehensive metabolic panel  showed all indices to be within normal  limits apart from glucose of 244.  Lipid profile showed a cholesterol of 163 with HDL 61 and LDL 59.  Triglycerides were slightly elevated at 166.  Urinalysis was negative.  MRI  of the brain with and without contrast on August 13, 2001 showed evidence of  prior stenting of the right middle cerebral artery.  There was a right  parietal infarct as well as scattered small white matter infarcts which were  chronic.  There was a small acute infarct in the posterior limb of the  internal capsule on the right.  Intracranial MRA showed right middle  cerebral artery occlusion and high-grade stenoses were noted in the terminal  right internal carotid, proximal right A1 and proximal left A1 segments.  MRI of the neck showed that the carotid bifurcations were widely patent and  there was a probable stenosis of the proximal left vertebral artery not well  seen on that study.  CT scan of the head without contrast on August 12, 2001  showed an old right frontal infarct with no acute intracranial  abnormality.  A 12-lead EKG showed sinus bradycardia with an otherwise normal tracing.   COURSE IN [place]:  The patient was admitted to the [facility].  She underwent the above studies without problems.  On July 28, MRI was  reviewed showing small acute right posterior limb of the internal capsule  infarct.  Patient's examination revealed normal vital signs with slight  droop of the left side of the face and mild left arm drift.  She had 4+ to -  5/5 weakness in the left upper extremity and -5/5 weakness in the left lower  extremity.  Plan at that point was to switch the patient to Aggrenox.  The  patient underwent evaluation by physical therapy and is felt that she might  benefit from some outpatient physical therapy and occupational therapy.  Patient is discharged later in the day on August 13, 2001, having arrived at  maximum hospital benefit.   FINAL DIAGNOSES:  1. Acute right posterior limb of the internal capsule infarct with mild left-     sided sensory and motor findings.  2. History of right frontal parietal infarct.  3. Right middle cerebral artery stenosis syndrome status post stenting with     post stenting occlusion.  4. History of insulin-dependent diabetes mellitus.  5. History of hypertension.   DISCHARGE MEDICATIONS:  Aggrenox 1 per day for 7 days, then 1 twice a day.  She was to continue her other home medications including Zocor, Glucotrol,  Glucophage, verapamil, Demadex, Paxil, prednisone, folic acid, insulin,  Nexium, and methotrexate.   DISPOSITION:  Patient is discharged to home with instructions to follow up  with outpatient occupational and physical therapies.  She has an appointment  pending with Dr. Jannifer Franklin on September 12, 2001.   CONDITION ON DISCHARGE:  Stable.   PROGNOSIS:  Fair.                                               Orson Eva, [name]   JJS/MEDQ  D:  08/31/2001  T:  09/04/2001  Job:  [id number]   cc:   [name],  [name]

## 2010-06-04 NOTE — Discharge Summary (Signed)
[name], [name] NO.:  [id number]   MEDICAL RECORD NO.:  [id number]          PATIENT TYPE:  INP   LOCATION:  3703                         FACILITY:  [facility]   PHYSICIAN:  Burnetta Sabin, N.P.      DATE OF BIRTH:  [birth year]   DATE OF ADMISSION:  06/22/2004  DATE OF DISCHARGE:  06/23/2004                                 DISCHARGE SUMMARY   DISCHARGE DIAGNOSES:  1.  New left leg weakness most likely associated with either migraine or      seizure. Not TIA.  2.  Hypertension.  3.  History of right brain infarct x2.  4.  History of right brain transient ischemic attack.  5.  Status post right middle cerebral artery stent five years ago, now      occluded.  6.  Type 2 diabetes uncontrolled.  7.  Dyslipidemia.  8.  Rheumatoid arthritis.   MEDICATIONS AT TIME OF DISCHARGE:  1.  Glucophage 1,000 mg b.i.d.  2.  Glucotrol 10 mg XL q.d.  3.  Novolin 70/30 10 units q.a.m., 40 mg q.p.m.  4.  Cortisol 10 mg a day.  5.  Aggrenox 1 b.i.d.  6.  Aspirin 81 a day.  7.  Zoloft 50 mg a day.  8.  Nortriptyline 10 mg a day.  9.  Actos 30 mg a day.  10. Niacin 500 mg q.d.  11. Verapamil 120 q.d.  12. Nexium 40 q.d.  13. Folic 1 mg q.d.  14. Methotrexate 2.5 mg 4 times a week.  15. Toprol 25 mg XL a day.   STUDIES PERFORMED:  1.  MRI of the brain was negative for acute stroke.  2.  CT scan of the head was negative for acute abnormality.  3.  Carotid Doppler and 2-D echocardiogram ordered but not performed.  4.  EKG shows normal sinus rhythm.   LABORATORY STUDIES:  Hemoglobin A1c high at 7.1. Homocysteine level  ______________ . Cholesterol 103, triglycerides 85, HDL 56, and LDL 30.  Hemoglobin 10.9, hematocrit 34.2, white blood cells 7.5, platelets 328,  calcium 9.1. Liver function tests normal. Chemistry normal except for  glucose at 161.   HISTORY OF PRESENT ILLNESS:  [name] is a 70 year old right handed  black female with history of right brain stroke x2  with right MCA stenosis  which was subsequently stented in [place] in 2000. In 2002, she found that  the stent was totally occluded. She continued to have right brain TIAs  during this period. She has been on Aggrenox at home for secondary stroke  prevention. She was doing okay until about 12 noon the day of admission when  she developed left leg weakness. She was brought to [facility] for  evaluation. She was admitted to rule out stroke. She was not in the TPA  window.   HOSPITAL COURSE:  MRI was unrevealing for new acute infarct. Symptoms of  left face numbness that marched down into her left shoulder and down her arm  was not consistent with that of a TIA or a stroke. It is  more likely felt  secondary to migraine or seizure. In fact, the patient does have a history  of migraines but has not had one in a long time. She did have a headache the  night prior to admission with some nausea. She does not really describe it  as a migraine; however, it very well may be. We will do EEG in the hospital  prior to discharge to rule out seizure though likely unrevealing. Of note,  hemoglobin A1c was found to be significantly elevated at 7.1. She feels she  has good control at home. She is followed by Dr. Wilson Singer. Recommendations are,  of course, for tight glucose control in the setting of a history of stroke.  Cholesterol levels remain well controlled as did blood pressure.   DISCHARGE PLAN:  1.  Discharged home in the care of husband who is there 24 hours.  2.  Outpatient physical therapy evaluation and treatment as needed.  3.  Followup with Dr. Wilson Singer for glucose control as well as other stroke risk      factors.  4.  Followup with Dr. Jannifer Franklin in two to three months.  5.  Aggrenox for secondary prevention.      SB/MEDQ  D:  06/23/2004  T:  06/23/2004  Job:  [id number]   cc:   [name], [name]  [address] [place] [address]  Fax: [phone]   [name], [name]  [address]  [place] [address]  Fax: [phone]

## 2010-06-04 NOTE — Procedures (Signed)
[place]. [facility]  Patient:    [name], [name] Visit Number: [id number] MRN: [id number]          Service Type: END Location: ENDO Attending Physician:  Jim Desanctis Dictated by:   Jim Desanctis, [name] Proc. Date: 05/15/01 Admit Date:  05/15/2001   CC:         Edsel Petrin. Dalbert Batman, [name]   Procedure Report  PROCEDURE:  Upper endoscopy.  SURGEON:  Jim Desanctis, [name]  INDICATIONS:  Iron-deficiency anemia and GI blood loss.  ANESTHESIA:  Demerol 100 and Versed 10 mg.  DESCRIPTION OF PROCEDURE:  With the patient mildly sedated in the left lateral decubitus position, the Olympus videoscopic endoscope was inserted in the mouth and passed under direct vision through the esophagus which appeared normal into the stomach which appeared mildly erythematous and was biopsied and photographed.  We entered into the duodenal bulb and second portion of the duodenum and both appeared normal.  From this point, the endoscope was slowly withdrawn, taking circumferential views of the entire duodenal mucosa, until the endoscope had been pulled back in the stomach and placed on retroflexion to view the stomach from below and a small hiatal hernia was evidenced in complete wrap over the GE junction and around the endoscope noted.  The endoscope was straightened and withdrawn, taking circumferential views of the remaining gastric and esophageal mucosa.  The patients vital signs and pulse oximeter remained stable.  The patient tolerated the procedure well and without apparent complications.  FINDINGS:  Mild erythema in the body of the stomach, photographed and biopsied, otherwise unremarkable examination.  PLAN:  Proceed to colonoscopy. Dictated by:   Jim Desanctis, [name] Attending Physician:  Jim Desanctis DD:  05/15/01 TD:  05/16/01 Job: [id number]

## 2010-06-04 NOTE — Discharge Summary (Signed)
[place]. [facility]  Patient:    [name], [name]                        MRN: [id number] Adm. Date:  [id number] Disc. Date: [id number] Attending:  Harlan Stains Courson CC:         Arleta Creek, [name]                           Discharge Summary  DATE OF BIRTH: [birth year]  DISCHARGE DIAGNOSIS: Transient ischemic attack.  HISTORY OF PRESENT ILLNESS: The patient is a 70 year old black female with known right middle cerebral artery stenosis, with recurrent stroke and TIA symptoms, who has had angioplasty of the right MCA on two occasions with restenosis, and came in on Jun 01, 1999 with another episode of left-sided numbness that was more severe than most of these episodes have been.  She did have associated weakness, particularly of the left leg.  Evaluation revealed she had actually a supertherapeutic INR and in spite of this did not have evidence of hemorrhage.  She has also continued to take an aspirin daily.  HOSPITAL COURSE: After admission to the hospital the patient was placed on IV heparin in addition to Coumadin, which was not given for two days due to INR being greater than 5; that has gradually come down and today at the time of discharge INR is 2.5.  Work-up in the hospital consisted of MRI with MRA.  The MRI did not show evidence of any stroke.  The MRA showed ongoing stenosis of the right MCA but this does appear to be still patent.  The patients hospital stay was complicated by development of shortness of breath yesterday morning. Chest x-ray revealed some mild congestive heart failure and shortness of breath improved with Lasix.  She also was evaluated for possible MI and cardiac enzymes and EKG were unremarkable.  The patient was seen by physical therapy, who recommended outpatient therapy to improve her ambulation and they have given her a quad cane to use, and she is making good progress with that.  DISCHARGE MEDICATIONS:  1.  Coumadin to take 5 mg q.d. over the weekend and then have pro time     rechecked on Monday.  2. She is to change from aspirin to Plavix with the Coumadin.  3. Remain on other medications including insulin, Zocor, Prevacid, Paxil,     Glucotrol, Glucophage, Covera, and Premarin at the same dose as she was     taking prior to this hospitalization.  DISCHARGE CONDITION: Improved.  DISCHARGE DIAGNOSIS: Transient ischemic attack with resulting left-sided numbness and weakness.  PLAN: Dr. Irene Pap discussed with the patient prior to discharge the possibility of placing a stent in the right MCA at the [facility] in [place], [place], and she is quite interested in pursuing that and will follow up with him regarding how to proceed with that.  DD:  06/04/99 TD:  06/08/99 Job: [id number]

## 2010-06-04 NOTE — H&P (Signed)
[name], WELLMAN NO.:  [id number]   MEDICAL RECORD NO.:  [id number]          PATIENT TYPE:  EMS   LOCATION:  MAJO                         FACILITY:  [facility]   PHYSICIAN:  Alyson Locket. Love, [name]    DATE OF BIRTH:  03-05-1940   DATE OF ADMISSION:  06/22/2004  DATE OF DISCHARGE:                                HISTORY & PHYSICAL   This 70 year old, right handed, black, married female was admitted from the  emergency room for evaluation of left leg weakness.   HISTORY OF PRESENT ILLNESS:  [name] has a 25-year history of diabetes  mellitus, a 10-year history of hypertension, and in May 1999 had a right MCA  stroke.  This was associated with left sided weakness and numbness and she  was found to have evidence of right middle cerebral artery stenosis.  She  was placed on aspirin and Plavix and subsequently underwent stent placement  in [place], [place] in 2000.  The repeat arteriogram, August 2002,  showed that the stent was occluded.  She has been on Aggrenox and aspirin  since July 2003, when she had a left sided weakness involving her leg  primarily and was found to have evidence of a right posterior lumen internal  capsule stroke.  She has been on Aggrenox 25/200 twice per day and aspirin  81 mg per day.  She has had some symptoms of left sided weakness and  numbness in the past.  She states that last evening she had headache and  this morning about 8 a.m. noted weakness and numbness in the left side of  her face, arm, and leg with some blurred vision in her left eye.  Initially  she thought she was improving, but she did not return to normal and at 12:45  noon she noticed that she was dragging her left leg more and had more left  arm weakness.  She was brought to the emergency room where there was  improvement in her left arm weakness.  She was first seen by myself with a  code stroke at 3 p.m.   PAST MEDICAL HISTORY:  1.  Right brain stroke, May  1999.  2.  Right posterior lumen internal capsule stroke, July 23, 2001.  3.  Diabetes mellitus for 25 years.  4.  Hypertension for 10 years.  5.  She is status post right MCA stent in 2000, with documented occlusion in      2002.  6.  Hysterectomy in 1978.  7.  Intestinal surgery in 2001 for what sounds to have been diverticulosis      with a subsequent colostomy and then takedown of the colostomy in June      2001.  8.  She has had rheumatoid arthritis.  9.  Elevated cholesterol.   MEDICATIONS:  1.  Glucophage 1,000 mg b.i.d.  2.  Glucotrol 10 mg XL every day.  3.  Novolin 70/30, 10 in the morning and 40 in the evening.  4.  Crestor 10 mg every day.  5.  Aggrenox 25/200 b.i.d.  6.  Aspirin  81 mg every day.  7.  Zoloft 50 mg every day.  8.  Nortriptyline 10 mg every day.  9.  Actos 30 mg every day.  10. Niacin 500 mg SA once per day.  11. Verapamil SA 120 mg every day.  12. Nexium 40 mg every day.  13. Folic acid 1 mg every day.  14. Methotrexate 2.5 mg four once per week.  15. Toprol XL 25 mg per day.   SOCIAL HISTORY:  She is independent in activities of daily living and drives  a car.  She has been disabled since 1999.  She does not smoke cigarettes.  She does not drink alcohol.  She is educated through a PhD degree.  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   She has no known drug allergies.   FAMILY HISTORY:  Her mother died at 86 with diabetes mellitus and her father  died at 10 with prostatic cancer.  She has a brother of 2, living well.  She has children, a son 44 with diabetes mellitus and a daughter who is, I  believe, 93 with diabetes mellitus.   PHYSICAL EXAMINATION:  GENERAL:  Revealed a well developed, pleasant, black  female, no acute distress who had improvement in her left arm weakness, when  I saw her.  VITAL SIGNS:  Blood pressure in the right arm was 150/80, left arm 150/80,  heart rate 62 and regular.  There was a left  supraclavicular bruit heard.  NECK:  Supple.  MENTAL STATUS:  She was alert, oriented x 3.  There was no denial of illness  on the left side.  NEUROLOGIC:  Cranial nerve examination revealed visual fields full.  Disks  flat.  Extraocular movements full.  __________  present.  No palsy.  Hearing  intact.  Air conduction greater than bone conduction.  Tongue midline.  Uvula midline.  Gag is present.  Sternocleidomastoid trapezius testing  normal.  Motor examination with bilateral 5 strength in the upper  extremities.  She could lift her left leg off the bed and it was at least 3  to 4 out of 5.  Then could hold it without drift for a count of 5.  She had  poor heel-to-shin on the left.  Sensory examination was intact to pinprick,  light touch, general position, vibration testing.  Deep tendon reflexes were  2+ with absent ankle jerks.  Plantar responses were downgoing.  HEENT:  Tympanic membranes were clear.  LUNGS:  Clear to auscultation.  HEART:  Revealed no murmurs.  ABDOMEN:  Bowel sounds were normal.  There is no large liver, spleen, or  kidneys.  EXTREMITIES:  There was no cyanosis, clubbing, or edema in the extremities.   A CT scan showed evidence of an old right frontal parietal ischemic stroke.   IMPRESSION:  1.  New onset left leg weakness seven and a half hours ago, probably stroke,      434.01.  2.  History of right brain stroke x 2 in 1999 and 2003, 434.01.  3.  History of right brain transient ischemic attack, 435.9.  4.  Intracranial atherosclerotic vascular disease status post right middle      cerebral artery stent in 2000 with documented occlusion, code unknown.  5.  Hypertension, 796.2.  6.  Diabetes mellitus, 250.60.  7.  Hyperlipidemia, 272.4.  8.  Rheumatoid arthritis, code unknown.  9.  History of intestinal surgery.   PLAN:  1.  At this time  is to admit the patient for observation.  2.  Re-evaluation with MRI study of the brain.      JML/MEDQ  D:   06/22/2004  T:  06/22/2004  Job:  [id number]

## 2010-06-04 NOTE — Procedures (Signed)
[facility]  Patient:    [name], [name]                 MRN: [id number] Proc. Date: 06/19/00 Adm. Date:  [id number] Attending:  Jim Desanctis CC:         Edsel Petrin. Dalbert Batman, [name]   Procedure Report  PROCEDURE PERFORMED:  Colonoscopy through ostomy.  ENDOSCOPIST:  Jim Desanctis, [name]  INDICATIONS FOR PROCEDURE:  Previous spontaneous perforation of colon. Colonoscopy done to assess for endoluminal lesions.  Previously had undergone a sigmoidoscopy to evaluate her distal remnant per rectum.  ANESTHESIA:  Demerol 60 mg, Versed 7 mg.  DESCRIPTION OF PROCEDURE:  With the patient mildly sedated in the recumbent decubitus position, the Olympus videoscopic colonoscope was inserted into the anastomotic pouch and passed under direct vision to what appeared to be the cecum.  There was clearly seen what I felt was the appendiceal orifice. Transillumination could not be visualized and a good visualization of the ileocecal valve was not obtained.  She had very tenacious fecal material that adhered to the mucosa despite washing with water but in the area that I felt was the cecum, no further advancement was possible.  I could see no further lumen on opening and I did see as stated, what appeared to be appendiceal orifice.  We looked for the ileocecal valve but could not find it in this situation.  From this point then, the colonoscope was slowly withdrawn, taking circumferential views of the entire colonic lumen visualized which otherwise appeared normal.  The patients vital signs remained stable.  The patient tolerated the procedure well and without apparent complications.  FINDINGS:  Presumed negative examination, no gross lesions seen.  PLAN:  I think it would be safe to reanastomose this patient and I will have her follow up with me subsequently. DD:  06/19/00 TD:  06/19/00 Job: [id number]

## 2010-06-04 NOTE — Op Note (Signed)
NAMEKARIGAN, MAHLKE NO.:  [id number]   MEDICAL RECORD NO.:  [id number]          PATIENT TYPE:  AMB   LOCATION:  ENDO                         FACILITY:  [facility]   PHYSICIAN:  Waverly Ferrari, [name]    DATE OF BIRTH:  02-May-1940   DATE OF PROCEDURE:  08/10/2005  DATE OF DISCHARGE:                                 OPERATIVE REPORT   PROCEDURE:  Colonoscopy.   ENDOSCOPIST:  Waverly Ferrari, [name]   INDICATIONS:  Hemoccult positivity.   ANESTHESIA:  Fentanyl 25 mcg, Versed 3 mg.   PROCEDURE:  With the patient mildly sedated in the left lateral decubitus  position, the Olympus videoscopic colonoscope was inserted in the rectum and  passed under direct vision to the cecum, identified by ileocecal valve and  appendiceal orifice, both of which were photographed.  From this point, the  colonoscope was slowly withdrawn, taking circumferential views of the  colonic mucosa, stopping in the rectum, which appeared normal on direct and  retroflexed view.  The endoscope was straightened and withdrawn.  The  patient's vital signs and pulse oximeter remained stable.  The patient  tolerated procedure well without apparent complications.   FINDINGS:  Rather unremarkable examination.   PLAN:  See endoscopy note for further details.           ______________________________  Waverly Ferrari, [name]     GMO/MEDQ  D:  08/10/2005  T:  08/10/2005  Job:  [id number]

## 2010-06-04 NOTE — Discharge Summary (Signed)
[facility]  Patient:    [name], [name]                        MRN: [id number] Adm. Date:  [id number] Disc. Date: [id number] Attending:  Barbera Setters CC:         Arleta Creek, [name]  Phineas Inches, [name]  [name], [name]  Lucille Passy. Ulanda Edison, [name]   Discharge Summary  FINAL DIAGNOSES: 1. Perforation of left transverse colon of uncertain etiology with mesenteric    abscess. 2. Insulin-dependent diabetes mellitus. 3. Cerebrovascular disease, status post cerebrovascular accident, status post    right middle cerebral artery stent. 4. Hypertension. 5. Gastroesophageal reflux disease.  OPERATIONS PERFORMED:  Exploratory laparotomy, transverse colon resection with colostomy and frozen section.  DATE OF SURGERY:  March 03, 2000.  HISTORY OF PRESENT ILLNESS:  This is a 70 year old black female who presented with a one week history of mild left sided abdominal pain.  The pain became more severe for the 24 hours prior to admission and she was constipated.  She does have fever, chills, nausea, vomiting or diarrhea or voiding symptoms. She was seen by Dr. Bernerd Limbo at [facility] and was found to have abdominal tenderness and a white blood cell count elevated at 17,600.  She was sent to me at [facility] Emergency Room for evaluation.  For details of her past medical history, social history and family history, please see the detailed dictated admission note.  PHYSICAL EXAMINATION:  GENERAL:  Pleasant, middle aged black female in mild distress.  She is overweight, alert and cooperative.  Temperature 97.3, heart rate 80, blood pressure 129/58.  LUNGS:  Clear to auscultation.  HEART:  Regular rate and rhythm with no murmur.  ABDOMEN:  Tender with guarding in the left lower quadrant, no mass, borderline distended, no bowel sounds present.  RECTAL:  Revealed hemoccult negative stool.  HOSPITAL COURSE:  The patient was  admitted and started on intravenous antibiotics.  A CT scan showed a focal thickened loop of the small bowel in the left upper quadrant adjacent to what appeared to be a focally thickened segment of the left transverse colon.  There was no free fluid, no perforation, no abscess noted.  It was clear how to interpret this, but differential diagnosis of atypical diverticulitis with microperforation, tumor with microperforation, atypical Crohns disease, lymphoma, or focal ischemia was entertained.  Over the next 24 hours her pain improved and she remained afebrile and she was up and ambulatory.  Her abdominal exam, however, continued to show left-sided abdominal tenderness now more in the left upper quadrant than anywhere else. We decided to observe her closely.  Dr. Gareth Eagle was asked to see and follow her.  He did evaluate her for all of her medical problems as outlined in the chart.  He also asked Dr. Floyde Parkins, her neurologist, to see her and he made recommendations regarding management of platelet inhibition and anticoagulation since she requires surgery.  Dr. Jim Desanctis was asked to see her as well because of the concern for the differential diagnosis.  He was concerned about ischemia and felt that she might need a laparotomy if she did not promptly improve.  The patient remained comfortable and afebrile, but continued to have abdominal tenderness.  CT scan was repeated and it was unchanged.  Again, the focally thickened segment of small bowel in the left upper quadrant adjacent to a focal thickened  area of left transverse colon was noted.  Because of her failure to improve on antibiotics and because of the uncertainty of the diagnosis, she was advised that she would need to undergo laparotomy.  Her bleeding time was greater than 15 minutes and we have to hold her Plavix and aspirin and give her platelet infusions.  She remains stable.  Once we got her bleeding time down to a  reasonable reading she was taken to the operating room on March 03, 2000.  She underwent laparotomy with findings of focal process of the left transverse colon with secondary inflammation of the small intestine.  The segment of the small intestine was otherwise healthy and did not require resection.  There was a large inflammatory mass in the left transverse colon and significant thickening of the mesentery.  This area was resected and frozen section showed benign inflammatory process with perforation into the mesentery.  A transverse end-colostomy was performed in the right upper quadrant and the left transverse colon distal to the resection was simply stapled over.  Postoperatively the patient did reasonably well.  She was maintained on Lovenox for a couple of days and then we restarted her aspirin and Plavix. Her diabetes was controlled quite nicely with sliding scale insulin and ultimately went back on b.i.d. intermediate acting insulin under the direction of Dr. Gareth Eagle.  Her blood pressure got somewhat elevated on March 07, 2000 up to 184/77, but we gave her some Lasix and restarted her Verapamil and Demodex and blood pressure remained under good control thereafter.  She progressed in her diet and activity, slowly but steadily.  Enterostomal therapy nurse began teaching the patient colostomy care shortly after surgery. Her husband was also involved.  This teaching went well, but she gained skills slowly and at the time of discharge was only able to empty the bag, but was not able to change the appliance.  The patient was discharged on March 10, 2000.  She was feeling well, tolerating a regular diet, ambulatory, the colostomy was working well, she was voiding well, comfortable and had no wound problems.  Her stoma looked pink  and healthy.  Her blood sugar was under control.  DISCHARGE MEDICATIONS:  1. Novolin 70/30 30 units at dinnertime.  2. Glucophage 1000 mg  b.i.d.  3. Glucotrol XL 10 mg b.i.d.  4. Plavix 75 mg q.d.  5. Aspirin 325 mg q.d.  6. Demodex 20 mg q.d.  7. Verapamil SA 120 mg q.d.  8. Paxil 30 mg q.d.  9. Prevacid 30 mg q.d. 10. Premarin 0.9 mg q.d. 11. Zocor 40 mg q.d. 12. Vicodin p.r.n. pain.  She was asked to return to see me in the office in four to six days to remove her staples.  Home health nursing was requested to continue ostomy care and ostomy teaching. D:  03/20/00 TD:  03/20/00 Job: [id number]

## 2010-06-04 NOTE — H&P (Signed)
[name]. [facility]  Patient:    [name], [name]                        MRN: [id number] Adm. Date:  [id number] Disc. Date: [id number] Attending:  Rob Hickman                         History and Physical  DATE OF BIRTH:  05-06-1940.  CHIEF COMPLAINT:  Left-sided numbness.  HISTORY OF PRESENT ILLNESS:  [name] is a 70 year old black female with history of known right old cerebral artery stenosis with recurrent stroke and TIA symptoms who has had angioplasty on the left middle cerebral artery x 2 with fairly rapid restenosis and has had a recurrent TIA today.  She was started back on Coumadin last month after she began to develop new left-sided numbness and weakness following angioplasty.  She remained on aspirin in addition to Coumadin.  She has done well since then until today when she developed, again, left-sided numbness and weakness that is more severe than many of her episodes have been.  She said that she had sudden onset of a very funny feeling involving the left face, arm, and leg associated with weakness in the arm and leg but particularly the leg.  The patient has an INR of 5.7 in the emergency room and has also been on aspirin.  CT scan done in the emergency room does not show evidence of hemorrhage or acute stroke at this point.  Overall, the patients symptoms have improved some since arrival in the ER but have not yet resolved.  REVIEW OF SYSTEMS:  Other than the left-sided numbness, she has not had any new symptoms.  No difficulty with speech, no double vision or loss of vision. No shortness of breath or chest pain.  No problem with bowel or bladder function.  Remainder of review of systems negative.  PAST MEDICAL HISTORY:  Significant for diabetes, hypertension, history of stroke with right middle cerebral artery stenosis, status post angioplasty x 2 with restenosis.  CURRENT MEDICATIONS:  Coumadin, aspirin, insulin, Zocor,  Prevacid, Paxil, Glucotrol, Glucophage, Covera, and Premarin.  ALLERGIES:  SULFA MEDICATIONS.  SOCIAL HISTORY:  No tobacco or alcohol use.  PHYSICAL EXAMINATION:  VITAL SIGNS:  Temperature 97.0, blood pressure 134/76, pulse 70, respiration 20.  GENERAL:  A 70 year old black female in no apparent distress  HEENT:  Pupils are equal, round and reactive to light and accommodation. Extraocular movements are intact.  Oropharynx is benign.  NECK:  Supple with no bruit.  HEART:  Regular rate and rhythm.  EXTREMITIES:  No clubbing, cyanosis or edema.  NEUROLOGICAL EXAM:  Mental status; she is alert and oriented x 4.  Her speech is clear with no dysarthria or aphasia.  Cranial nerve testing revealed left facial numbness but intact strength.  Tongue protrudes in the midline. Shoulder shrug is symmetric.  On funduscopic exam discs are sharp.  Visual fields are full.  Motor strength testing 5/5 strength in the right upper extremity, 4+/5 strength in the left upper extremity, 5/5 strength in the right lower extremity, 3/5 in the proximal left lower extremity and 4+/5 in the distal left lower extremity.  She reports decreased sensation in the left arm and leg compared to the right.  Reflexes are slightly ___ .  The left and right toes are downgoing bilaterally.  DIAGNOSTIC DATA:  CT scan of the abdomen is  negative for acute process.  INR is 5.7.  CBC and metabolic panel are within normal limits.  IMPRESSION:  A 70 year old black female with known right mid carotid artery stenosis with recurrent symptoms today suggestive of a new stroke despite supratherapeutic Coumadin and aspirin.  PLAN:  The patient will be admitted.  She will have an MRI and evaluate for a new stroke.  Place her on IV fluids and heparin.  Will hold Coumadin until INR comes back into the normal range.  She may need to have Plavix added to her medication regimen for breakthrough symptoms despite Coumadin and  aspirin. Depending on how well she recovers over the next few days, may or may not need rehab evaluation for these symptoms. DD:  06/01/99 TD:  06/01/99 Job: [id number]

## 2010-06-04 NOTE — H&P (Signed)
NAME:  [name], [name]                           ACCOUNT NO.:  [id number]   MEDICAL RECORD NO.:  [id number]                   PATIENT TYPE:  INP   LOCATION:  3008                                 FACILITY:  [facility]   PHYSICIAN:  Flint Melter. Jacolyn Reedy, [name]          DATE OF BIRTH:  [birth year]   DATE OF ADMISSION:  08/12/2001  DATE OF DISCHARGE:  08/13/2001                                HISTORY & PHYSICAL   CHIEF COMPLAINT:  Left leg weakness.   HISTORY OF PRESENT ILLNESS:  [name] is a 70 year old right-handed woman  with known history of cerebrovascular disease, hypertension, diabetes.  She  has a history of right MCA focal stenosis with infarct with left-sided  weakness and numbness in 1999.  She was eventually sent to [facility] for  right MCA stent around 2000 or 2001.  It was in November in one or the other  of those years.  She had a brief period symptom-free and then began having  left-sided symptoms again.  Repeat angiogram in August 2002 shows the stent  has been occluded.  At some point she was on Coumadin for a time.  More  recently she has been on aspirin and Plavix.  She has continued to have  intermittent left body symptoms.  Yesterday she felt left-sided numbness and  weakness again, stayed in bed.  Symptoms subsided substantially and she went  to church this morning, but then again had left-sided, chiefly leg weakness.  Came to the emergency room.  Over the course of time symptoms have almost  completely resolved but she still has subjective decreased sensation and  feels that her left leg is still weak.   REVIEW OF SYMPTOMS:  Positive for some mild dizziness, headache yesterday,  but not now.  No shortness of breath.  No vision problems.   PAST MEDICAL HISTORY:  Significant for cerebrovascular disease, status post  one known infarct in the right frontal lobe associated with a right MCA  stenosis, status post right MCA stent which is subsequently occluded.  At  least on  her current imaging there is no evidence of other stroke besides  this one but she has repeated similar symptoms of left-sided deficit.  Other  medical problems include hypertension, insulin-dependent diabetes,  hypercholesterolemia, rheumatoid arthritis, diverticulosis, diverticulitis,  status post colon resection with colostomy and then takedown with an  anastomosis later.  She has had remote hysterectomy and she has had CHF.   MEDICATIONS:  Aspirin, Plavix, Zocor, Glucotrol, Glucophage, verapamil,  Demadex, Paxil, prednisone, folic acid, methotrexate, Novolin, Nexium.   ALLERGIES:  SULFA.   SOCIAL HISTORY:  She is married, disabled.  Does not smoke cigarettes or  drink alcohol.   FAMILY HISTORY:  Positive for diabetes and hypertension.   PHYSICAL EXAMINATION:  VITAL SIGNS:  Temperature 97.5, pulse 50, blood pressure 152/67,  respirations 20, 99% saturation on room air.   HEENT:  Head is normocephalic, atraumatic.  NECK:  Supple without bruits.   HEART:  Regular rate and rhythm.   LUNGS:  Clear to auscultation.   ABDOMEN:  Soft, nontender.   EXTREMITIES:  Without edema.   NEUROLOGIC:  Mental status:  She is alert and oriented and fully oriented.  Normal language and cognition noted.  No neglect or denial.  Cranial nerves:  Pupils are equal and reactive.  Visual fields full to confrontation.  Extraocular movements are intact.  Facial sensation is mildly diminished on  the left in a patchy fashion.  Facial motor activity is normal without  drift.  Hearing is intact.  Palate is symmetric.  Tongue is midline.  There  is no dysarthria.  On motor examination there is no drift in the upper or  lower extremities.  There is slight satelliting of the left arm, although  she does have an IV in that arm.  It is hard to tell if that is real or not.  No weakness, however, in individually tested muscle groups.  She is 5/5  bilaterally in the upper and lower extremities.  Gait was not  examined.  Reflexes 1+ with downgoing toes.  Coordination:  Seltzer-to-nose and heel-to-  shin were normal.  Sensory examination subjectively shows decreased  sensation in multi modalities in the left arm and patchy areas in the face  and legs as well.   LABORATORIES:  CT of the brain shows old right frontal cortical and  subcortical infarct with a little bit of encephalomalacia.  No atrophy or  growth.  Nothing acute.   Laboratories are pending.   IMPRESSION:  Recurrent transient ischemic attack in the right brain with  left body symptoms, but no hard findings on examination or scan.   PLAN:  Continue aspirin and Plavix.  Recheck MRI scan of the brain with  diffusion weighted images, MR angiogram, carotid ultrasound.  Consider  Aggrenox.                                               Catherine A. Jacolyn Reedy, [name]    CAW/MEDQ  D:  08/12/2001  T:  08/15/2001  Job:  [id number]

## 2010-06-04 NOTE — Op Note (Signed)
[name], PEELER NO.:  [id number]   MEDICAL RECORD NO.:  [id number]          PATIENT TYPE:  AMB   LOCATION:  ENDO                         FACILITY:  [facility]   PHYSICIAN:  Waverly Ferrari, [name]    DATE OF BIRTH:  1940-06-26   DATE OF PROCEDURE:  08/10/2005  DATE OF DISCHARGE:                                 OPERATIVE REPORT   PROCEDURE:  Upper endoscopy.   INDICATIONS:  Hemoccult positivity.   ANESTHESIA:  Fentanyl 50 mcg, Versed 5 mg.   PROCEDURE:  With the patient mildly sedated in the left lateral decubitus  position the Olympus videoscopic endoscope was inserted into the mouth and  passed under direct vision through the esophagus which appeared normal and  into the stomach.  Fundus body, antrum, duodenal bulb, second portion of  duodenum were visualized.  From this point the endoscope was slowly  withdrawn taking several inch views of the duodenal mucosa until the  endoscope had been pulled back into the stomach and placed in a retroflexion  view of the stomach from below.  The endoscope was then straightened then  withdrawn taking several inch views of midepigastric and esophageal mucosa,  stopping in the antrum where changes of erosions and erythema consistent  with a localized gastritis were photographed and multiple biopsies were  taken.  There was also one area where there was a single thickened reddened  area of the fold in the fundus which also was photographed and biopsied.  The patient's vital signs and pulse oximetry remained stable.  The patient  tolerated the procedure well without apparent complications.   FINDINGS:  Erythematous fold in the fundus and changes of erosions and  erythema of the antrum, all biopsied.  Await biopsy reports.  The patient  will call me for results and follow up with me as an outpatient and proceed  to colonoscopy.           ______________________________  Waverly Ferrari, [name]     GMO/MEDQ  D:  08/10/2005  T:   08/11/2005  Job:  [id number]

## 2010-06-04 NOTE — Procedures (Signed)
[facility]  Patient:    [name], [name]                        MRN: [id number] Proc. Date: 05/18/00 Adm. Date:  [id number] Attending:  Jim Desanctis CC:         Edsel Petrin. Dalbert Batman, [name]   Procedure Report  ADDENDUM:  CC both procedures to Fanny Skates. DD:  05/18/00 TD:  05/18/00 Job: [id number]

## 2010-06-04 NOTE — Op Note (Signed)
[facility]  Patient:    [name], [name]                        MRN: [id number] Proc. Date: 03/03/00 Adm. Date:  [id number] Attending:  Barbera Setters CC:         Arleta Creek, [name]  Phineas Inches, [name]  [name], [name]  Lucille Passy. Ulanda Edison, [name]   Operative Report  PREOPERATIVE DIAGNOSIS:  Inflammatory mass of jejunum and transverse colon.  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  OPERATION: 1. Exploratory laparotomy. 2. Transverse colon resection with transverse end colostomy.  SURGEON:  Edsel Petrin. Dalbert Batman, [name]  FIRST ASSISTANT:  Ward Givens, [name]  INDICATIONS:  This is a 70 year old black female with insulin-dependent diabetes, hypertension, and cerebrovascular disease.  She presented with a one-week history of left-sided abdominal pain which became more severe on the day of admission.  She was found to have a white count of 17,600 and a tender left abdomen.  CT scan showed thickened segment of small bowel in the left upper quadrant adjacent to a focal thickened area of transverse colon.  There was no obstruction.  She was treated as an atypical diverticulitis, and her leukocytosis resolved, but she continued to have localized tenderness in the left upper quadrant.  She was taken off of her aspirin and Plavix and operated upon because of failure to respond to medical therapy with antibiotics.  INTRAOPERATIVE FINDINGS:  There was an abscess in the left transverse colon mesentery the size of a baseball.  This was contained.  There was no peritonitis or exudate in the peritoneal cavity.  The mucosa of the transverse colon looked reasonably normal except, in one area, there was some granular ulcerated areas with a fistula to the abscess.  This was not a classic diverticular perforation.  The small bowel, specifically the proximal jejunum, was adherent to this  area and was thickened, but this appeared to be secondarily inflamed.  There was no evidence of any primary small-bowel disease or perforation.  The liver and gallbladder looked normal.  The stomach looked normal.  The rest of the small intestine looked normal.  The right colon, descending colon, and sigmoid colon felt normal.  There were extensive adhesions in the lower midline from her previous hysterectomy.  OPERATIVE TECHNIQUE:  Following the induction of general endotracheal anesthesia, a Foley catheter and a nasogastric tube were inserted.  The abdomen was prepped and draped in sterile fashion.  Midline laparotomy was performed both above and below the umbilicus.  Dissection was carried down to the subcutaneous tissue and the fascia in the midline.  We entered the abdominal cavity.  We had to spend some time taking down adhesions of the omentum and small intestine to the lower midline of the abdominal wall.  Once we did all of this, we further explored the abdomen with findings as described above.  We were able to dissect the small bowel away from the transverse colon bluntly, and we were able to identify the ligament of Treitz, and we ran the small intestine from the ligament of Treitz all the way down to the ileocecal valve.  We took down some adhesions which were soft and chronic.  There did not appear to be any primary disease of the small intestine.  A large inflammatory mass on the left transverse colon was noted.  We felt that we would have to  resect this area because of the infection and concern for tumor.  We divided the omentum between clamps and ligated omental bleeders with 2-0 silk ties.  We entered the gastrocolic omentum.  We identified an area of transverse colon about 5 inches proximal to the inflammatory mass and isolated this segment of colon.  We transected this with GIA stapling device.  We then dissected distally all along the colon mesentery, staying fairly  close to the colon, isolating the mesenteric vessels between clamps and dividing these. The mesenteric vessels were either ligated with 2-0 silk ties or suture ligated with 2-0 silk ties.  The mesentery was thickened, and we had to do this dissection slowly and in small bites.  We ultimately took the dissection through the mesentery distal to the inflammatory mass and isolated a segment of colon about 2 inches distal to the inflammatory mass and transected it with a GIA stapling device.  We thus removed the specimen.  We cut the specimen open on the back table, and it appeared to be an inflammatory mass.  We sent the specimen to Dr. Claudette Laws in the pathology lab.  Frozen section showed benign inflammatory tissue consistent with mesenteric abscess.  Dr. Saralyn Pilar said that he found a fistula between the mucosa of the transverse colon and the mesenteric abscess, but this was not a typical diverticular perforation. No foreign body was identified.  No tumor was identified.  We irrigated all of the areas of dissection with saline and removed all the irrigation fluid.  There was no bleeding.  We brought the right transverse colon out in the right upper quadrant.  A circular button of skin was excised overlying the lateral aspect of the right rectus muscle midway between the costal margin and the umbilicus.  The subcutaneous fat was removed.  The anterior rectus sheath was incised in a cruciate fashion.  The muscle was divided with electrocautery.  There were some arterial bleeders which were cauterized.  We then incised the posterior rectus sheath in cruciate fashion. We dilated this traction until it would admit three fingers.  We then passed the colon through this tunnel in the abdominal wall, and the colon was pink and healthy.   Midline fascia was closed with a running suture of #1 PDS.  The skin was closed with skin staples loosely.  We placed Telfa wicks between each  skin staple.  We then cut off the staple line on the colostomy and matured the colostomy with eight interrupted sutures of 3-0 Vicryl.  The mucosa was pink and viable.  A colostomy bag was placed over this.  Clean bandages were placed, and the patient taken to the recovery room in stable condition.  Estimated blood loss was about 250 cc.  Complications: None.  Sponge, needle, and instrument counts were correct. The patient was given 1 units of packed red blood cells during the procedure. DD:  03/03/00 TD:  03/04/00 Job: [id number]

## 2010-07-08 ENCOUNTER — Other Ambulatory Visit: Payer: Self-pay | Admitting: Neurology

## 2010-07-08 DIAGNOSIS — G458 Other transient cerebral ischemic attacks and related syndromes: Secondary | ICD-10-CM

## 2010-07-17 ENCOUNTER — Ambulatory Visit
Admission: RE | Admit: 2010-07-17 | Discharge: 2010-07-17 | Disposition: A | Discharge status: Home / Self Care | Payer: Medicare Other | Source: Ambulatory Visit | Attending: Neurology | Admitting: Neurology

## 2010-07-17 DIAGNOSIS — G458 Other transient cerebral ischemic attacks and related syndromes: Secondary | ICD-10-CM

## 2010-10-05 ENCOUNTER — Other Ambulatory Visit: Payer: Self-pay | Admitting: Endocrinology

## 2010-10-05 ENCOUNTER — Other Ambulatory Visit (HOSPITAL_COMMUNITY)
Admission: RE | Admit: 2010-10-05 | Discharge: 2010-10-05 | Disposition: A | Discharge status: Home / Self Care | Payer: Medicare Other | Source: Ambulatory Visit | Attending: Endocrinology | Admitting: Endocrinology

## 2010-10-05 DIAGNOSIS — Z124 Encounter for screening for malignant neoplasm of cervix: Secondary | ICD-10-CM | POA: Insufficient documentation

## 2010-10-27 LAB — LIPID PANEL
Cholesterol: 80
HDL: 47
LDL Cholesterol: 25
Total CHOL/HDL Ratio: 1.7
Triglycerides: 38
VLDL: 8

## 2010-10-27 LAB — DIFFERENTIAL
Basophils Absolute: 0
Basophils Relative: 0
Eosinophils Absolute: 0.1
Eosinophils Relative: 1
Lymphocytes Relative: 32
Lymphs Abs: 2.6
Monocytes Absolute: 0.7
Monocytes Relative: 9
Neutro Abs: 4.7
Neutrophils Relative %: 57

## 2010-10-27 LAB — CBC
HCT: 38.8
Hemoglobin: 12.5
MCHC: 32.3
MCV: 90.9
Platelets: 324
RBC: 4.26
RDW: 14.2 — ABNORMAL HIGH
WBC: 8.2

## 2010-10-27 LAB — COMPREHENSIVE METABOLIC PANEL
ALT: 25
ALT: 33
AST: 39 — ABNORMAL HIGH
AST: 47 — ABNORMAL HIGH
Albumin: 3.1 — ABNORMAL LOW
Albumin: 3.7
Alkaline Phosphatase: 54
Alkaline Phosphatase: 69
BUN: 13
BUN: 14
CO2: 25
CO2: 26
Calcium: 8.8
Calcium: 9.7
Chloride: 103
Chloride: 105
Creatinine, Ser: 1.05
Creatinine, Ser: 1.24 — ABNORMAL HIGH
GFR calc Af Amer: 52 — ABNORMAL LOW
GFR calc Af Amer: >60
GFR calc non Af Amer: 43 — ABNORMAL LOW
GFR calc non Af Amer: 52 — ABNORMAL LOW
Glucose, Bld: 105 — ABNORMAL HIGH
Glucose, Bld: 125 — ABNORMAL HIGH
Potassium: 3.8
Potassium: 4.6
Sodium: 139
Sodium: 139
Total Bilirubin: 0.7
Total Bilirubin: 0.7
Total Protein: 6.2
Total Protein: 7.1

## 2010-10-27 LAB — URINE MICROSCOPIC-ADD ON

## 2010-10-27 LAB — CK TOTAL AND CKMB (NOT AT ARMC)
CK, MB: 2.1
CK, MB: 2.5
Relative Index: 1.2
Relative Index: 1.4
Total CK: 170
Total CK: 184 — ABNORMAL HIGH

## 2010-10-27 LAB — URINALYSIS, ROUTINE W REFLEX MICROSCOPIC
Bilirubin Urine: NEGATIVE
Glucose, UA: NEGATIVE
Hgb urine dipstick: NEGATIVE
Ketones, ur: 15 — AB
Nitrite: NEGATIVE
Protein, ur: NEGATIVE
Specific Gravity, Urine: 1.009
Urobilinogen, UA: 0.2
pH: 7.5

## 2010-10-27 LAB — HEMOGLOBIN A1C
Hgb A1c MFr Bld: 6
Mean Plasma Glucose: 136

## 2010-10-27 LAB — PROTIME-INR
INR: 1
Prothrombin Time: 13.3

## 2010-10-27 LAB — RAPID URINE DRUG SCREEN, HOSP PERFORMED
Amphetamines: NOT DETECTED
Barbiturates: NOT DETECTED
Benzodiazepines: NOT DETECTED
Cocaine: NOT DETECTED
Opiates: NOT DETECTED
Tetrahydrocannabinol: NOT DETECTED

## 2010-10-27 LAB — HOMOCYSTEINE: Homocysteine: 9.7

## 2010-10-27 LAB — TROPONIN I
Troponin I: 0.01
Troponin I: 0.02

## 2010-10-27 LAB — APTT: aPTT: 28

## 2010-10-27 LAB — ETHANOL: Alcohol, Ethyl (B): <5

## 2011-02-10 ENCOUNTER — Other Ambulatory Visit: Payer: Self-pay | Admitting: Endocrinology

## 2011-02-10 DIAGNOSIS — Z1231 Encounter for screening mammogram for malignant neoplasm of breast: Secondary | ICD-10-CM

## 2011-03-10 ENCOUNTER — Ambulatory Visit
Admission: RE | Admit: 2011-03-10 | Discharge: 2011-03-10 | Disposition: A | Discharge status: Home / Self Care | Payer: Medicare Other | Source: Ambulatory Visit | Attending: Endocrinology | Admitting: Endocrinology

## 2011-03-10 DIAGNOSIS — Z1231 Encounter for screening mammogram for malignant neoplasm of breast: Secondary | ICD-10-CM

## 2011-03-11 ENCOUNTER — Ambulatory Visit: Payer: Medicare Other

## 2012-02-13 ENCOUNTER — Other Ambulatory Visit: Payer: Self-pay | Admitting: Endocrinology

## 2012-02-13 DIAGNOSIS — Z1231 Encounter for screening mammogram for malignant neoplasm of breast: Secondary | ICD-10-CM

## 2012-03-13 ENCOUNTER — Ambulatory Visit: Payer: Medicare Other

## 2012-03-14 ENCOUNTER — Ambulatory Visit
Admission: RE | Admit: 2012-03-14 | Discharge: 2012-03-14 | Disposition: A | Discharge status: Home / Self Care | Payer: Medicare Other | Source: Ambulatory Visit | Attending: Endocrinology | Admitting: Endocrinology

## 2012-03-14 DIAGNOSIS — Z1231 Encounter for screening mammogram for malignant neoplasm of breast: Secondary | ICD-10-CM

## 2013-01-25 ENCOUNTER — Telehealth: Payer: Self-pay | Admitting: Neurology

## 2013-01-25 ENCOUNTER — Telehealth: Payer: Self-pay | Admitting: *Deleted

## 2013-01-25 NOTE — Telephone Encounter (Signed)
RETURNING CALL  °

## 2013-01-25 NOTE — Telephone Encounter (Signed)
I called and left VM for patient that samples are ready for pick up. We do not have any more samples but, there should be enough to get her through until her next office visit.

## 2013-01-25 NOTE — Telephone Encounter (Signed)
Looks like this may be a duplicate chart.  Other phone note is under MRN [id number] I will send a ticket to the help desk and ask them to merge charts if applicable.

## 2013-01-25 NOTE — Telephone Encounter (Signed)
[name] has already contacted the patient.

## 2013-01-25 NOTE — Telephone Encounter (Signed)
I called patient back and left another VM. Last OV 12.10.2013. Please call and make appointment with Dr. Leonie Man or his nurse practitioner. Have the person who schedules your appointment send message to me that appointment is scheduled. We can only give you a thirty day supply but, you should be able to get in within that time with NP. We can only give you an Rx for that time until you are seen.

## 2013-01-25 NOTE — Telephone Encounter (Signed)
I called and left VM for patient. Please call with dosage information on the aggrenox and to schedule an appointment. If we have seen patient in the past year, and we are looking into this on our old medical record system, we will be able to get you some samples until you can get Rx filled. But, with that, we will need to have you call and schedule an appointment before we can give you samples. Please call and schedule appointment and have note sent to my attention about dosage for aggrenox and I will look up the same in the old record. I will then call you when samples are ready to pick up.

## 2013-01-25 NOTE — Telephone Encounter (Signed)
Please advise 

## 2013-01-30 ENCOUNTER — Encounter: Payer: Self-pay | Admitting: Neurology

## 2013-01-30 ENCOUNTER — Ambulatory Visit (INDEPENDENT_AMBULATORY_CARE_PROVIDER_SITE_OTHER): Payer: 59 | Admitting: Neurology

## 2013-01-30 ENCOUNTER — Encounter (INDEPENDENT_AMBULATORY_CARE_PROVIDER_SITE_OTHER): Payer: Self-pay

## 2013-01-30 VITALS — BP 123/49 | HR 50 | Wt 186.0 lb

## 2013-01-30 DIAGNOSIS — R519 Headache, unspecified: Secondary | ICD-10-CM | POA: Insufficient documentation

## 2013-01-30 DIAGNOSIS — Z8673 Personal history of transient ischemic attack (TIA), and cerebral infarction without residual deficits: Secondary | ICD-10-CM

## 2013-01-30 DIAGNOSIS — R51 Headache: Secondary | ICD-10-CM

## 2013-01-30 MED ORDER — INSULIN NPH ISOPHANE & REGULAR (70-30) 100 UNIT/ML ~~LOC~~ SUSP: SUBCUTANEOUS | Status: DC

## 2013-01-30 NOTE — Patient Instructions (Signed)
Ischemic Stroke A stroke (cerebrovascular accident) is the sudden death of brain tissue. It is a medical emergency. A stroke can cause permanent loss of brain function. This can cause problems with different parts of your body. A transient ischemic attack (TIA) is different because it does not cause permanent damage. A TIA is a short-lived problem of poor blood flow affecting a part of the brain. A TIA is also a serious problem because having a TIA greatly increases the chances of having a stroke. When symptoms first develop, you cannot know if the problem might be a stroke or TIA. CAUSES  A stroke is caused by a decrease of oxygen supply to an area of your brain. It is usually the result of a small blood clot or collection of cholesterol or fat (plaque) that blocks blood flow in the brain. A stroke can also be caused by blocked or damaged carotid arteries.  RISK FACTORS  High blood pressure (hypertension).  High cholesterol.  Diabetes mellitus.  Heart disease.  The build up of plaque in the blood vessels (peripheral artery disease or atherosclerosis).  The build up of plaque in the blood vessels providing blood and oxygen to the brain (carotid artery stenosis).  An abnormal heart rhythm (atrial fibrillation).  Obesity.  Smoking.  Taking oral contraceptives (especially in combination with smoking).  Physical inactivity.  A diet high in fats, salt (sodium), and calories.  Alcohol use.  Use of illegal drugs (especially cocaine and methamphetamine).  Being African American.  Being over the age of 55.  Family history of stroke.  Previous history of blood clots, stroke, TIA, or heart attack.  Sickle cell disease. SYMPTOMS  These symptoms usually develop suddenly, or may be newly present upon awakening from sleep:  Sudden weakness or numbness of the face, arm, or leg, especially on one side of the body.  Sudden trouble walking or difficulty moving arms or legs.  Sudden  confusion.  Sudden personality changes.  Trouble speaking (aphasia) or understanding.  Difficulty swallowing.  Sudden trouble seeing in one or both eyes.  Double vision.  Dizziness.  Loss of balance or coordination.  Sudden severe headache with no known cause.  Trouble reading or writing. DIAGNOSIS  Your caregiver can often determine the presence or absence of a stroke based on your symptoms, history, and physical exam. Computed tomography (CT) of the brain is usually performed to confirm the stroke, determine causes, and determine stroke severity. Other tests may be done to find the cause of the stroke. These tests may include:  Electrocardiography.  Continuous heart monitoring.  Echocardiography.  Carotid ultrasonography.  Magnetic resonance imaging (MRI).  A scan of the brain circulation.  Blood tests. PREVENTION  The risk of a stroke can be decreased by appropriately treating high blood pressure, high cholesterol, diabetes, heart disease, and obesity and by quitting smoking, limiting alcohol, and staying physically active. TREATMENT  Time is of the essence. It is important to seek treatment within 3 4 hours of the start of symptoms because you may receive a medicine to dissolve the clot (thrombolytic) that cannot be given after that time. Even if you do not know when your symptoms began, get treatment as soon as possible. After the 4 hour window has passed, treatment may include rest, oxygen, intravenous (IV) fluids, and medicines to thin the blood (anticoagulants). Treatment of stroke depends on the duration, severity, and cause of your symptoms. Medicines and diet may be used to address diabetes, high blood pressure, and other risk   factors. Physical, speech, and occupational therapists will assess you and work to improve any functions impaired by the stroke. Measures will be taken to prevent short-term and long-term complications, including infection from breathing  foreign material into the lungs (aspiration pneumonia), blood clots in the legs, bedsores, and falls. Rarely, surgery may be needed to remove large blood clots or to open up blocked arteries. HOME CARE INSTRUCTIONS   Take all medicines prescribed by your caregiver. Follow the directions carefully. Medicines may be used to control risk factors for a stroke. Be sure you understand all your medicine instructions.  You may be told to take aspirin or the anticoagulant warfarin. Warfarin needs to be taken exactly as instructed.  Too much and too little warfarin are both dangerous. Too much warfarin increases the risk of bleeding. Too little warfarin continues to allow the risk for blood clots. While taking warfarin, you will need to have regular blood tests to measure your blood clotting time. These blood tests usually include both the PT and INR tests. The PT and INR results allow your caregiver to adjust your dose of warfarin. The dose can change for many reasons. It is critically important that you take warfarin exactly as prescribed, and that you have your PT and INR levels drawn exactly as directed.  Many foods, especially foods high in vitamin K can interfere with warfarin and affect the PT and INR results. Foods high in vitamin K include spinach, kale, broccoli, cabbage, collard and turnip greens, brussels sprouts, peas, cauliflower, seaweed, and parsley as well as beef and pork liver, green tea, and soybean oil. You should eat a consistent amount of foods high in vitamin K. Avoid major changes in your diet, or notify your caregiver before changing your diet. Arrange a visit with a dietitian to answer your questions.  Many medicines can interfere with warfarin and affect the PT and INR results. You must tell your caregiver about any and all medicines you take, this includes all vitamins and supplements. Be especially cautious with aspirin and anti-inflammatory medicines. Do not take or discontinue any  prescribed or over-the-counter medicine except on the advice of your caregiver or pharmacist.  Warfarin can have side effects, such as excessive bruising or bleeding. You will need to hold pressure over cuts for longer than usual. Your caregiver or pharmacist will discuss other potential side effects.  Avoid sports or activities that may cause injury or bleeding.  Be mindful when shaving, flossing your teeth, or handling sharp objects.  Alcohol can change the body's ability to handle warfarin. It is best to avoid alcoholic drinks or consume only very small amounts while taking warfarin. Notify your caregiver if you change your alcohol intake.  Notify your dentist or other caregivers before procedures.  If swallow studies have determined that your swallowing reflex is present, you should eat healthy foods. A diet that includes 5 or more servings of fruits and vegetables a day may reduce the risk of stroke. Foods may need to be a special consistency (soft or pureed), or small bites may need to be taken in order to avoid aspirating or choking. Certain diets may be prescribed to address high blood pressure, high cholesterol, diabetes, or obesity.  A low-sodium, low-saturated fat, low-trans fat, low-cholesterol diet is recommended to manage high blood pressure.  A low-saturated fat, low-trans fat, low-cholesterol, and high-fiber diet may control cholesterol levels.  A controlled-carbohydrate, controlled-sugar diet is recommended to manage diabetes.  A reduced-calorie, low-sodium, low-saturated fat, low-trans fat, low-cholesterol diet   is recommended to manage obesity.  Maintain a healthy weight.  Stay physically active. It is recommended that you get at least 30 minutes of activity on most or all days.  Do not smoke.  Limit alcohol use even if you are not taking warfarin. Moderate alcohol use is considered to be:  No more than 2 drinks each day for men.  No more than 1 drink each day for  nonpregnant women.  Stop drug abuse.  Home safety. A safe home environment is important to reduce the risk of falls. Your caregiver may arrange for specialists to evaluate your home. Having grab bars in the bedroom and bathroom is often important. Your caregiver may arrange for equipment to be used at home, such as raised toilets and a seat for the shower.  Physical, occupational, and speech therapy. Ongoing therapy may be needed to maximize your recovery after a stroke. If you have been advised to use a walker or a cane, use it at all times. Be sure to keep your therapy appointments.  Follow all instructions for follow-up with your caregiver. This is very important. This includes any referrals, physical therapy, rehabilitation, and lab tests. Proper follow up can prevent another stroke from occurring. SEEK MEDICAL CARE IF:  You have personality changes.  You have difficulty swallowing.  You are seeing double.  You have dizziness.  You have a fever.  You have skin breakdown. SEEK IMMEDIATE MEDICAL CARE IF:  Any of these symptoms may represent a serious problem that is an emergency. Do not wait to see if the symptoms will go away. Get medical help right away. Call your local emergency services (911 in [place] Do not drive yourself to the hospital.  You have sudden weakness or numbness of the face, arm, or leg, especially on one side of the body.  You have sudden trouble walking or difficulty moving arms or legs.  You have sudden confusion.  You have trouble speaking (aphasia) or understanding.  You have sudden trouble seeing in one or both eyes.  You have a loss of balance or coordination.  You have a sudden, severe headache with no known cause.  You have new chest pain or an irregular heartbeat.  You have a partial or total loss of consciousness.   Document Released: 01/03/2005 Document Revised: 09/05/2012 Document Reviewed: 08/14/2011 [employer] Patient Information 2014  [employer]  

## 2013-01-30 NOTE — Progress Notes (Signed)
Reason for visit: Cerebrovascular disease  [name] is an 73 y.o. female  History of present illness:  [name] is a 73 year old right-handed black female with a history of cerebrovascular disease. The patient has a history of a right frontal stroke in the past, and she has M1 segment stenosis, and she underwent a stenting procedure. The stent subsequently occluded. The patient has done fairly well over time, and she is on Aggrenox therapy. The patient has diabetes and hypertension, and she indicates that her diabetes has been under good control recently. The patient denies any new medical issues that have come up since last seen approximately one year ago. The patient reports no new numbness or weakness of the face, arms, or legs. The patient does have a diabetic peripheral neuropathy, but she takes low-dose nortriptyline at night with good benefit. The patient is only having very occasional headaches at this point. The patient returns for routine reevaluation.  Past Medical History  Diagnosis Date  . Cerebrovascular disease   . Diabetes   . Hypertension   . Diabetic peripheral neuropathy   . Dyslipidemia   . Diabetic retinopathy   . Diverticulitis   . Depression   . Obesity   . Migraine   . History of cerebrovascular accident 01/30/2013  . Headache(784.0) 01/30/2013    Past Surgical History  Procedure Laterality Date  . Other surgical history      laser surgery  . Middle cerebral artery stent placement Right   . Abdominal hysterectomy    . Bilateral cataract surgery    . Right knee surgery      for infection    Family History  Problem Relation Age of Onset  . Diabetes Mother   . Heart disease Mother   . Prostate cancer Father   . Hypertension Brother   . Prostate cancer Brother     Social history:  reports that she has never smoked. She has never used smokeless tobacco. She reports that she does not drink alcohol or use illicit drugs.   No Known  Allergies  Medications:  No current outpatient prescriptions on file prior to visit.   No current facility-administered medications on file prior to visit.    ROS:  Out of a complete 14 system review of symptoms, the patient complains only of the following symptoms, and all other reviewed systems are negative.  Occasional headache Numbness  Blood pressure 123/49, pulse 50, weight 186 lb (84.369 kg).  Physical Exam  General: The patient is alert and cooperative at the time of the examination. The patient is minimally obese.  Skin: No significant peripheral edema is noted.   Neurologic Exam  Mental status: The patient is oriented x 3.  Cranial nerves: Facial symmetry is present. Speech is normal, no aphasia or dysarthria is noted. Extraocular movements are full. Visual fields are full.  Motor: The patient has good strength in all 4 extremities.  Sensory examination: Soft touch sensation is symmetric on the face, arms, and legs.  Coordination: The patient has good Fix-nose-Bloor and heel-to-shin bilaterally.  Gait and station: The patient has a normal gait. Tandem gait is normal. Romberg is negative. No drift is seen.  Reflexes: Deep tendon reflexes are symmetric.   Assessment/Plan:  One. History of cerebrovascular disease, right frontal stroke  2. Diabetes  3. Hypertension  4. History of headache  The patient is doing fairly well at this point. The patient will continue her Aggrenox, and she will followup in one year.  The patient has been very stable from a neurologic standpoint.    [name] MD 01/30/2013 7:48 PM  [facility] [address] [place], [place] [address]  Phone [phone] Fax [phone]

## 2013-06-14 ENCOUNTER — Ambulatory Visit (HOSPITAL_COMMUNITY): Payer: Medicare PPO | Attending: Cardiology | Admitting: Radiology

## 2013-06-14 ENCOUNTER — Other Ambulatory Visit (HOSPITAL_COMMUNITY): Payer: Self-pay | Admitting: Radiology

## 2013-06-14 DIAGNOSIS — R011 Cardiac murmur, unspecified: Secondary | ICD-10-CM

## 2013-06-14 NOTE — Progress Notes (Signed)
Echocardiogram performed.  

## 2013-12-04 ENCOUNTER — Encounter: Payer: Self-pay | Admitting: Neurology

## 2013-12-10 ENCOUNTER — Encounter: Payer: Self-pay | Admitting: Neurology

## 2013-12-26 ENCOUNTER — Telehealth: Payer: Self-pay | Admitting: Interventional Cardiology

## 2013-12-26 NOTE — Telephone Encounter (Signed)
Error

## 2014-01-06 DIAGNOSIS — I209 Angina pectoris, unspecified: Secondary | ICD-10-CM

## 2014-01-06 DIAGNOSIS — E1159 Type 2 diabetes mellitus with other circulatory complications: Secondary | ICD-10-CM

## 2014-01-07 ENCOUNTER — Ambulatory Visit (HOSPITAL_COMMUNITY)
Admission: RE | Admit: 2014-01-07 | Discharge: 2014-01-08 | Disposition: A | Discharge status: Home / Self Care | Payer: Medicare PPO | Source: Ambulatory Visit | Attending: Cardiology | Admitting: Cardiology

## 2014-01-07 ENCOUNTER — Encounter (HOSPITAL_COMMUNITY)
Admission: RE | Disposition: A | Discharge status: Home / Self Care | Payer: Medicare PPO | Source: Ambulatory Visit | Attending: Cardiology

## 2014-01-07 ENCOUNTER — Encounter (HOSPITAL_COMMUNITY): Payer: Self-pay | Admitting: General Practice

## 2014-01-07 DIAGNOSIS — Z794 Long term (current) use of insulin: Secondary | ICD-10-CM | POA: Diagnosis not present

## 2014-01-07 DIAGNOSIS — I25119 Atherosclerotic heart disease of native coronary artery with unspecified angina pectoris: Secondary | ICD-10-CM | POA: Diagnosis not present

## 2014-01-07 DIAGNOSIS — E785 Hyperlipidemia, unspecified: Secondary | ICD-10-CM | POA: Diagnosis not present

## 2014-01-07 DIAGNOSIS — R001 Bradycardia, unspecified: Secondary | ICD-10-CM | POA: Insufficient documentation

## 2014-01-07 DIAGNOSIS — I129 Hypertensive chronic kidney disease with stage 1 through stage 4 chronic kidney disease, or unspecified chronic kidney disease: Secondary | ICD-10-CM | POA: Diagnosis not present

## 2014-01-07 DIAGNOSIS — E1121 Type 2 diabetes mellitus with diabetic nephropathy: Secondary | ICD-10-CM | POA: Diagnosis not present

## 2014-01-07 DIAGNOSIS — Z7982 Long term (current) use of aspirin: Secondary | ICD-10-CM | POA: Insufficient documentation

## 2014-01-07 DIAGNOSIS — E1140 Type 2 diabetes mellitus with diabetic neuropathy, unspecified: Secondary | ICD-10-CM | POA: Insufficient documentation

## 2014-01-07 DIAGNOSIS — N189 Chronic kidney disease, unspecified: Secondary | ICD-10-CM | POA: Insufficient documentation

## 2014-01-07 DIAGNOSIS — I25118 Atherosclerotic heart disease of native coronary artery with other forms of angina pectoris: Secondary | ICD-10-CM

## 2014-01-07 DIAGNOSIS — I209 Angina pectoris, unspecified: Secondary | ICD-10-CM

## 2014-01-07 DIAGNOSIS — R079 Chest pain, unspecified: Secondary | ICD-10-CM | POA: Diagnosis present

## 2014-01-07 DIAGNOSIS — I2510 Atherosclerotic heart disease of native coronary artery without angina pectoris: Secondary | ICD-10-CM

## 2014-01-07 DIAGNOSIS — E1159 Type 2 diabetes mellitus with other circulatory complications: Secondary | ICD-10-CM

## 2014-01-07 DIAGNOSIS — Z9861 Coronary angioplasty status: Secondary | ICD-10-CM

## 2014-01-07 HISTORY — DX: Angina pectoris, unspecified: I20.9

## 2014-01-07 HISTORY — DX: Gastro-esophageal reflux disease without esophagitis: K21.9

## 2014-01-07 HISTORY — DX: Hyperlipidemia, unspecified: E78.5

## 2014-01-07 HISTORY — DX: Transient cerebral ischemic attack, unspecified: G45.9

## 2014-01-07 HISTORY — DX: Atherosclerotic heart disease of native coronary artery without angina pectoris: I25.10

## 2014-01-07 HISTORY — PX: LEFT HEART CATHETERIZATION WITH CORONARY ANGIOGRAM: SHX5451

## 2014-01-07 HISTORY — DX: Chronic kidney disease, unspecified: N18.9

## 2014-01-07 HISTORY — DX: Cardiac murmur, unspecified: R01.1

## 2014-01-07 HISTORY — PX: PTCA: SHX146

## 2014-01-07 HISTORY — PX: CARDIAC CATHETERIZATION: SHX172

## 2014-01-07 LAB — GLUCOSE, CAPILLARY
Glucose-Capillary: 60 mg/dL — ABNORMAL LOW (ref 70–99)
Glucose-Capillary: 130 mg/dL — ABNORMAL HIGH (ref 70–99)
Glucose-Capillary: 35 mg/dL — CL (ref 70–99)
Glucose-Capillary: 108 mg/dL — ABNORMAL HIGH (ref 70–99)
Glucose-Capillary: 185 mg/dL — ABNORMAL HIGH (ref 70–99)
Glucose-Capillary: 97 mg/dL (ref 70–99)
Glucose-Capillary: 137 mg/dL — ABNORMAL HIGH (ref 70–99)

## 2014-01-07 LAB — POCT ACTIVATED CLOTTING TIME
Activated Clotting Time: 183 seconds
Activated Clotting Time: 528 seconds

## 2014-01-07 LAB — HEMOGLOBIN A1C
Hgb A1c MFr Bld: 6.7 % — ABNORMAL HIGH (ref <5.7)
Mean Plasma Glucose: 146 mg/dL — ABNORMAL HIGH (ref <117)

## 2014-01-07 SURGERY — LEFT HEART CATHETERIZATION WITH CORONARY ANGIOGRAM
Anesthesia: LOCAL

## 2014-01-07 MED ORDER — CLOPIDOGREL BISULFATE 75 MG PO TABS
75.0000 mg | ORAL_TABLET | Freq: Every day | ORAL | Status: DC
  Administered 2014-01-08: 75 mg via ORAL
  Filled 2014-01-07: qty 1

## 2014-01-07 MED ORDER — ALOGLIPTIN-METFORMIN HCL 12.5-1000 MG PO TABS: 12.5000 | ORAL_TABLET | Freq: Two times a day (BID) | ORAL | Status: DC

## 2014-01-07 MED ORDER — ASPIRIN 81 MG PO CHEW: 81.0000 mg | CHEWABLE_TABLET | ORAL | Status: DC

## 2014-01-07 MED ORDER — SODIUM CHLORIDE 0.9 % IV SOLN: 250.0000 mL | INTRAVENOUS | Status: DC | PRN

## 2014-01-07 MED ORDER — DEXTROSE 50 % IV SOLN
INTRAVENOUS | Status: AC
  Administered 2014-01-07: 25 mL via INTRAVENOUS
  Filled 2014-01-07: qty 50

## 2014-01-07 MED ORDER — BIVALIRUDIN 250 MG IV SOLR
INTRAVENOUS | Status: AC
  Filled 2014-01-07: qty 250

## 2014-01-07 MED ORDER — VERAPAMIL HCL ER 120 MG PO TBCR
120.0000 mg | EXTENDED_RELEASE_TABLET | Freq: Every day | ORAL | Status: DC
  Administered 2014-01-07 – 2014-01-08 (×2): 120 mg via ORAL
  Filled 2014-01-07 (×2): qty 1

## 2014-01-07 MED ORDER — SODIUM CHLORIDE 0.9 % IV SOLN
INTRAVENOUS | Status: AC
  Administered 2014-01-07 (×2): 150 mL/h via INTRAVENOUS

## 2014-01-07 MED ORDER — DIAZEPAM 2 MG PO TABS
2.0000 mg | ORAL_TABLET | ORAL | Status: DC | PRN
  Administered 2014-01-07: 20:00:00 2 mg via ORAL
  Filled 2014-01-07: qty 1

## 2014-01-07 MED ORDER — ISOSORBIDE MONONITRATE ER 60 MG PO TB24
60.0000 mg | ORAL_TABLET | Freq: Every day | ORAL | Status: DC
  Administered 2014-01-07 – 2014-01-08 (×2): 60 mg via ORAL
  Filled 2014-01-07 (×2): qty 1

## 2014-01-07 MED ORDER — SODIUM CHLORIDE 0.9 % IV SOLN
40.0000 mg | Freq: Once | INTRAVENOUS | Status: AC
  Administered 2014-01-07: 15:00:00 40 mg via INTRAVENOUS
  Filled 2014-01-07 (×2): qty 4

## 2014-01-07 MED ORDER — CLOPIDOGREL BISULFATE 300 MG PO TABS
ORAL_TABLET | ORAL | Status: AC
  Filled 2014-01-07: qty 1

## 2014-01-07 MED ORDER — MIDAZOLAM HCL 2 MG/2ML IJ SOLN
INTRAMUSCULAR | Status: AC
  Filled 2014-01-07: qty 2

## 2014-01-07 MED ORDER — DEXTROSE 50 % IV SOLN
25.0000 mL | Freq: Once | INTRAVENOUS | Status: AC
  Administered 2014-01-07: 25 mL via INTRAVENOUS

## 2014-01-07 MED ORDER — LIDOCAINE HCL (PF) 1 % IJ SOLN
INTRAMUSCULAR | Status: AC
  Filled 2014-01-07: qty 30

## 2014-01-07 MED ORDER — INSULIN ASPART 100 UNIT/ML ~~LOC~~ SOLN
0.0000 [IU] | Freq: Three times a day (TID) | SUBCUTANEOUS | Status: DC
  Administered 2014-01-07: 13:00:00 3 [IU] via SUBCUTANEOUS

## 2014-01-07 MED ORDER — ACETAMINOPHEN 325 MG PO TABS: 650.0000 mg | ORAL_TABLET | ORAL | Status: DC | PRN

## 2014-01-07 MED ORDER — FENTANYL CITRATE 0.05 MG/ML IJ SOLN
INTRAMUSCULAR | Status: AC
  Filled 2014-01-07: qty 2

## 2014-01-07 MED ORDER — VERAPAMIL HCL 2.5 MG/ML IV SOLN
INTRAVENOUS | Status: AC
  Filled 2014-01-07: qty 2

## 2014-01-07 MED ORDER — NITROGLYCERIN 1 MG/10 ML FOR IR/CATH LAB
INTRA_ARTERIAL | Status: AC
  Filled 2014-01-07: qty 10

## 2014-01-07 MED ORDER — ONDANSETRON HCL 4 MG/2ML IJ SOLN: 4.0000 mg | Freq: Four times a day (QID) | INTRAMUSCULAR | Status: DC | PRN

## 2014-01-07 MED ORDER — ASPIRIN-DIPYRIDAMOLE ER 25-200 MG PO CP12: 1.0000 | ORAL_CAPSULE | Freq: Two times a day (BID) | ORAL | Status: DC

## 2014-01-07 MED ORDER — FAMOTIDINE 40 MG/5ML PO SUSR
40.0000 mg | Freq: Two times a day (BID) | ORAL | Status: DC
  Filled 2014-01-07: qty 5

## 2014-01-07 MED ORDER — HEPARIN (PORCINE) IN NACL 2-0.9 UNIT/ML-% IJ SOLN
INTRAMUSCULAR | Status: AC
  Filled 2014-01-07: qty 1500

## 2014-01-07 MED ORDER — COLCHICINE 0.6 MG PO TABS
0.6000 mg | ORAL_TABLET | Freq: Every day | ORAL | Status: DC
  Administered 2014-01-07 – 2014-01-08 (×2): 0.6 mg via ORAL
  Filled 2014-01-07 (×2): qty 1

## 2014-01-07 MED ORDER — METOPROLOL SUCCINATE ER 50 MG PO TB24
50.0000 mg | ORAL_TABLET | Freq: Two times a day (BID) | ORAL | Status: DC
  Administered 2014-01-07 – 2014-01-08 (×3): 50 mg via ORAL
  Filled 2014-01-07 (×4): qty 1

## 2014-01-07 MED ORDER — SODIUM CHLORIDE 0.9 % IJ SOLN: 3.0000 mL | Freq: Two times a day (BID) | INTRAMUSCULAR | Status: DC

## 2014-01-07 MED ORDER — NORTRIPTYLINE HCL 10 MG PO CAPS
10.0000 mg | ORAL_CAPSULE | Freq: Every day | ORAL | Status: DC
  Administered 2014-01-07 – 2014-01-08 (×2): 10 mg via ORAL
  Filled 2014-01-07 (×2): qty 1

## 2014-01-07 MED ORDER — LINAGLIPTIN 5 MG PO TABS
5.0000 mg | ORAL_TABLET | Freq: Every day | ORAL | Status: DC
  Administered 2014-01-07 – 2014-01-08 (×2): 5 mg via ORAL
  Filled 2014-01-07 (×3): qty 1

## 2014-01-07 MED ORDER — SODIUM CHLORIDE 0.9 % IV SOLN: INTRAVENOUS | Status: DC

## 2014-01-07 MED ORDER — VENLAFAXINE HCL ER 37.5 MG PO CP24
37.5000 mg | ORAL_CAPSULE | Freq: Every day | ORAL | Status: DC
  Administered 2014-01-07 – 2014-01-08 (×2): 37.5 mg via ORAL
  Filled 2014-01-07 (×3): qty 1

## 2014-01-07 MED ORDER — NIACIN ER (ANTIHYPERLIPIDEMIC) 500 MG PO TBCR
500.0000 mg | EXTENDED_RELEASE_TABLET | Freq: Every day | ORAL | Status: DC
  Administered 2014-01-07 – 2014-01-08 (×2): 500 mg via ORAL
  Filled 2014-01-07 (×2): qty 1

## 2014-01-07 MED ORDER — ROSUVASTATIN CALCIUM 10 MG PO TABS
10.0000 mg | ORAL_TABLET | Freq: Every day | ORAL | Status: DC
  Administered 2014-01-07 – 2014-01-08 (×2): 10 mg via ORAL
  Filled 2014-01-07 (×2): qty 1

## 2014-01-07 MED ORDER — INSULIN ASPART 100 UNIT/ML ~~LOC~~ SOLN: 3.0000 [IU] | Freq: Three times a day (TID) | SUBCUTANEOUS | Status: DC

## 2014-01-07 MED ORDER — FEBUXOSTAT 40 MG PO TABS
40.0000 mg | ORAL_TABLET | Freq: Every day | ORAL | Status: DC
  Administered 2014-01-07 – 2014-01-08 (×2): 40 mg via ORAL
  Filled 2014-01-07 (×2): qty 1

## 2014-01-07 MED ORDER — FAMOTIDINE 40 MG PO TABS
40.0000 mg | ORAL_TABLET | Freq: Two times a day (BID) | ORAL | Status: DC
  Administered 2014-01-07 – 2014-01-08 (×2): 40 mg via ORAL
  Filled 2014-01-07 (×3): qty 1

## 2014-01-07 MED ORDER — SODIUM CHLORIDE 0.9 % IV BOLUS (SEPSIS)
500.0000 mL | Freq: Once | INTRAVENOUS | Status: AC
  Administered 2014-01-07: 500 mL via INTRAVENOUS

## 2014-01-07 MED ORDER — SODIUM CHLORIDE 0.9 % IJ SOLN: 3.0000 mL | INTRAMUSCULAR | Status: DC | PRN

## 2014-01-07 MED ORDER — NITROGLYCERIN 0.4 MG SL SUBL: 0.4000 mg | SUBLINGUAL_TABLET | SUBLINGUAL | Status: DC | PRN

## 2014-01-07 MED ORDER — METFORMIN HCL 500 MG PO TABS
1000.0000 mg | ORAL_TABLET | Freq: Two times a day (BID) | ORAL | Status: DC
  Filled 2014-01-07 (×5): qty 2

## 2014-01-07 MED ORDER — FOLIC ACID 1 MG PO TABS
1.0000 mg | ORAL_TABLET | Freq: Every day | ORAL | Status: DC
  Administered 2014-01-07 – 2014-01-08 (×2): 1 mg via ORAL
  Filled 2014-01-07 (×2): qty 1

## 2014-01-07 NOTE — Progress Notes (Signed)
HYPOGLYCEMIC EVENT  CBG: 38  Treatment: D50 IV 25 mL  Symptoms: None  Follow-up CBG: [id number] CBG Result:108  Possible Reasons for Event: Other: NPO PRIOR TO PROCEDURE  Comments/MD notified:DR Micco NOTIFIED. PROTOCOL FOLLOWED.

## 2014-01-07 NOTE — Progress Notes (Signed)
TR BAND REMOVAL  LOCATION:    right radial  DEFLATED PER PROTOCOL:    Yes.    TIME BAND OFF / DRESSING APPLIED:   1300   SITE UPON ARRIVAL:    Level 0  SITE AFTER BAND REMOVAL:    Level 0  REVERSE ALLEN'S TEST:     positive  CIRCULATION SENSATION AND MOVEMENT:    Within Normal Limits   Yes.    COMMENTS:   Patient tolerated well.

## 2014-01-07 NOTE — Progress Notes (Signed)
Inpatient Diabetes Program Recommendations  AACE/ADA: New Consensus Statement on Inpatient Glycemic Control (2013)  Target Ranges:  Prepandial:   less than 140 mg/dL      Peak postprandial:   less than 180 mg/dL (1-2 hours)      Critically ill patients:  140 - 180 mg/dL   Reason for Assessment:  Results for MYLIE, SPARBY (MRN [id number]) as of 01/07/2014 13:02  Ref. Range 01/07/2014 05:58 01/07/2014 06:42 01/07/2014 09:23 01/07/2014 09:43 01/07/2014 09:51 01/07/2014 12:26  Glucose-Capillary Latest Range: 70-99 mg/dL 60 (L) 130 (H) 35 (LL) 108 (H)  185 (H)   Diabetes history: Type 2 diabetes Outpatient Diabetes medications: 70/30 20 units q AM, 40 units q PM Current orders for Inpatient glycemic control:  Novolog moderate tid with meals, Novolog 3 units tid with meals, Tradgenta 5 mg daily, Metformin 1000 mg bid  Note that patient had hypoglycemic event this morning.  Treated per protocol.  No recommendations at this time.  Adah Perl, RN, BC-ADM Inpatient Diabetes Coordinator Pager [phone]

## 2014-01-07 NOTE — Interval H&P Note (Signed)
Cath Lab Visit (complete for each Cath Lab visit)  Clinical Evaluation Leading to the Procedure:   ACS: No.  Non-ACS:    Anginal Classification: CCS III  Anti-ischemic medical therapy: Maximal Therapy (2 or more classes of medications)  Non-Invasive Test Results: Intermediate-risk stress test findings: cardiac mortality 1-3%/year  Prior CABG: No previous CABG   Patient Information:  1-2V CAD, no prox LAD A (8) Indication: 17; Score 8 1224 Patient Information:  CTO of 1 vessel, no other CAD A (7) Indication: 27; Score 7 1223 Patient Information:  1V CAD with prox LAD A (9) Indication: 33; Score 9 1225 Patient Information:  2V-CAD with prox LAD A (9) Indication: 39; Score 9 1226 Patient Information:  3V-CAD without LMCA A (9) Indication: 45; Score 9 1229 Patient Information:  3V-CAD without LMCA  With Abnormal LV systolic function A (9) Indication: 48; Score 9 1230 Patient Information:  LMCA-CAD A (9) Indication: 49; Score 9 1235 Patient Information:  2V-CAD with prox LAD  PCI A (7) Indication: 62; Score 7 1228 Patient Information:  2V-CAD with prox LAD  CABG A (8) Indication: 62; Score 8 1227 Patient Information:  3V-CAD without LMCA  With Low CAD burden(i.e., 3 focal stenoses, low SYNTAX score)  PCI A (7) Indication: 63; Score 7 1232 Patient Information:  3V-CAD without LMCA  With Low CAD burden(i.e., 3 focal stenoses, low SYNTAX score)  CABG A (9) Indication: 63; Score 9 1231    History and Physical Interval Note:  01/07/2014 7:36 AM  [name]  has presented today for surgery, with the diagnosis of cp  The various methods of treatment have been discussed with the patient and family. After consideration of risks, benefits and other options for treatment, the patient has consented to  Procedure(s): LEFT HEART CATHETERIZATION WITH CORONARY ANGIOGRAM (N/A) as a surgical intervention .  The patient's history has been  reviewed, patient examined, no change in status, stable for surgery.  I have reviewed the patient's chart and labs.  Questions were answered to the patient's satisfaction.     Laverda Page

## 2014-01-07 NOTE — H&P (Signed)
  Please see office visit notes for complete details of HPI.  

## 2014-01-07 NOTE — Interval H&P Note (Signed)
History and Physical Interval Note:  01/07/2014 7:35 AM  [name]  has presented today for surgery, with the diagnosis of cp  The various methods of treatment have been discussed with the patient and family. After consideration of risks, benefits and other options for treatment, the patient has consented to  Procedure(s): LEFT HEART CATHETERIZATION WITH CORONARY ANGIOGRAM (N/A) and possible PCI  as a surgical intervention .  The patient's history has been reviewed, patient examined, no change in status, stable for surgery.  I have reviewed the patient's chart and labs.  Questions were answered to the patient's satisfaction.     Laverda Page

## 2014-01-07 NOTE — CV Procedure (Signed)
Procedure performed:  Left heart catheterization including  Selective right and left coronary arteriography. PTCA and stenting of the distal RCA with implantation of a 2.5 x 14 mm resolute integrity DES.  Indication: Patient is a fairly active 73 year-old African-American female with history of hypertension,  hyperlipidemia,  Diabetes Mellitus, CKD   who presents with class III angina pectoris in spite of aggressive medical therapy. Patient has  had non invasive testing which was abnormal with inferior wall ischemia, intermediate risk.  Hence is brought to the cardiac catheterization lab to evaluate  coronary anatomy for definitive diagnosis of CAD.  Hemodynamic data: Aortic pressure was 154/59 with a mean of 94 mm mercury.  Left ventricle: Not Performed. There is mild pericardial calcification noted in the inferior wall.  Right coronary artery: Dominant. It has an anterior origin. Gives origin to large PDA and a moderate size file. There is mild luminal irregularity the proximal segment. At the bifurcation, there is a high-grade 99% stenosis.   Left main coronary artery is large and normal.  Circumflex coronary artery: A large vessel giving origin to a moderate sized obtuse marginal 1. There is mild luminal irregularity and mild calcification the proximal segment. After the bifurcation of the OM1, the circumflex has 20% stenosis which is diffuse.  LAD:  LAD gives origin to a large diagonal-1.  LAD has mild luminal irregularities.   Impression: High-grade stenosis of the distal RCA, constituting 99% stenosis.  Interventional data: Successful PTCA and stenting of the distal RCA with implantation of a 2.5 x 14 mm resolute integrity DES. Will need Dual antiplatelet therapy with Plavix and ASA 81 mg for at least one year. Patient is on dipyridamole aspirin combination, will consider discontinuation of dipyridamole.   Technique of diagnostic cardiac catheterization:  Under sterile precautions using  a 6 French right radial  arterial access, a 6 French sheath was introduced into the right radial artery. A 5 [place] Tig 4 catheter was advanced into the ascending aorta selective   left coronary artery was cannulated and angiography was performed in multiple views. The catheter was pulled back Out of the body over exchange length J-wire. The right coronary artery was then cannulated with a 5 [place] JR4 diagnostic catheter. The right coronary artery had anterior origin. Catheter exchanged out of the body over J-Wire. NO immediate complications noted.    Technique of intervention:  Using a 6 [place] Ikari 1.5 guide catheter the right  coronary  was selected and cannulated. Because of ventricularization and also damping, the catheter was pulled out and I attempted to engage the RCA with a Williams right with sidehole 6 [place] guide catheter, I was unable to engage the RCA. I then utilized the previously used [employer], created a sidehole, this time is able to have good backup support and no damping or ventricularization. Using Angiomax for anticoagulation, I utilized a [employer] guidewire and across the right coronary artery with difficulty. I  had to use a 1.5 x 10 mm Euphora balloon to cross the very high-grade stenosis. Placed the tip of the wire into the PDA, I then performed multiple balloon angioplasty at 14 atmospheric pressure throughout the high-grade stenosis and into the PDA. 14 atmospheric pressure for 60 seconds and his 5 was performed. I then advanced a 0.014 x 1 90 cm cougar XT guidewire into the PDA for backup support. Over this wire after exchanging the previously used 1.5 mm balloon, I used a  angiosculpt 2.0 x 6 mm balloon and again  14 atmospheric balloon inflation was performed throughout the distal RCA. 3 inflations at 60 seconds were performed. This was followed by withdrawing the balloon out of the body and taking a 2.5 x 14 mm resolute integrity DES to the site of stenosis, deployed at 8  atmospheric pressure for 10 seconds, followed by withdrawal of the BMW guidewire that was placed in the PDA. The stent was extended into the PL branch over the cougar XT wire. Stent eventually deployed at 14 atmospheric pressure for 50 seconds. Intracoronary nitroglycerin was administered. Post angioplasty angiography revealed excellent results without any residual stenosis or thrombus or dissection. The guidewire was withdrawn, guide catheter disengaged and pulled out of the body. Therewas no immediate complication. Hemostasis achieved with TR band. Patient tolerated the procedure well.  Disposition: Patient will be discharged in morning unless complications with out-patient follow up. A total of 110-120 mL of contrast was utilized for diagnostic and interventional procedure.

## 2014-01-08 DIAGNOSIS — E1140 Type 2 diabetes mellitus with diabetic neuropathy, unspecified: Secondary | ICD-10-CM | POA: Diagnosis not present

## 2014-01-08 DIAGNOSIS — E1121 Type 2 diabetes mellitus with diabetic nephropathy: Secondary | ICD-10-CM | POA: Diagnosis not present

## 2014-01-08 DIAGNOSIS — I129 Hypertensive chronic kidney disease with stage 1 through stage 4 chronic kidney disease, or unspecified chronic kidney disease: Secondary | ICD-10-CM | POA: Diagnosis not present

## 2014-01-08 DIAGNOSIS — I25119 Atherosclerotic heart disease of native coronary artery with unspecified angina pectoris: Secondary | ICD-10-CM | POA: Diagnosis not present

## 2014-01-08 LAB — CBC
HCT: 33.5 % — ABNORMAL LOW (ref 36.0–46.0)
Hemoglobin: 10.7 g/dL — ABNORMAL LOW (ref 12.0–15.0)
MCH: 29.2 pg (ref 26.0–34.0)
MCHC: 31.9 g/dL (ref 30.0–36.0)
MCV: 91.3 fL (ref 78.0–100.0)
Platelets: 211 10*3/uL (ref 150–400)
RBC: 3.67 MIL/uL — ABNORMAL LOW (ref 3.87–5.11)
RDW: 13.9 % (ref 11.5–15.5)
WBC: 7.8 10*3/uL (ref 4.0–10.5)

## 2014-01-08 LAB — BASIC METABOLIC PANEL
Anion gap: 6 (ref 5–15)
BUN: 13 mg/dL (ref 6–23)
CO2: 21 mmol/L (ref 19–32)
Calcium: 8.3 mg/dL — ABNORMAL LOW (ref 8.4–10.5)
Chloride: 111 mEq/L (ref 96–112)
Creatinine, Ser: 1.64 mg/dL — ABNORMAL HIGH (ref 0.50–1.10)
GFR calc Af Amer: 34 mL/min — ABNORMAL LOW (ref >90)
GFR calc non Af Amer: 30 mL/min — ABNORMAL LOW (ref >90)
Glucose, Bld: 113 mg/dL — ABNORMAL HIGH (ref 70–99)
Potassium: 4.4 mmol/L (ref 3.5–5.1)
Sodium: 138 mmol/L (ref 135–145)

## 2014-01-08 LAB — GLUCOSE, CAPILLARY: Glucose-Capillary: 113 mg/dL — ABNORMAL HIGH (ref 70–99)

## 2014-01-08 MED ORDER — ASPIRIN 81 MG PO TABS: 81.0000 mg | ORAL_TABLET | Freq: Every day | ORAL | Status: DC

## 2014-01-08 MED ORDER — CLOPIDOGREL BISULFATE 75 MG PO TABS: 75.0000 mg | ORAL_TABLET | Freq: Every day | ORAL | Status: DC

## 2014-01-08 MED ORDER — FAMOTIDINE 40 MG PO TABS: 40.0000 mg | ORAL_TABLET | Freq: Two times a day (BID) | ORAL | Status: DC

## 2014-01-08 MED FILL — Sodium Chloride IV Soln 0.9%: INTRAVENOUS | Qty: 50 | Status: AC

## 2014-01-08 NOTE — Progress Notes (Signed)
CARDIAC REHAB PHASE I   PRE:  Rate/Rhythm: 60 by pulse (off monitor)    BP: sitting 150/42    SaO2:   MODE:  Ambulation: 450 ft   POST:  Rate/Rhythm: 72 pulse    BP: sitting 177/51     SaO2:   Tolerated well. Tired toward end. Ed completed with good reception. Interested in [place] and will send referral to [facility]. Pt eager to ex again.   [place], [place], ACSM 01/08/2014 9:55 AM

## 2014-01-08 NOTE — Progress Notes (Signed)
PHARMACIST - PHYSICIAN COMMUNICATION DR:  Medical team CONCERNING:  METFORMIN SAFE ADMINISTRATION POLICY  RECOMMENDATION: Metformin has been placed on DISCONTINUE (rejected order) STATUS and should be reordered only after any of the conditions below are ruled out.  Current safety recommendations include avoiding metformin for a minimum of 48 hours after the patient's exposure to intravenous contrast media.  DESCRIPTION:  The Pharmacy Committee has adopted a policy that restricts the use of metformin in hospitalized patients until all the contraindications to administration have been ruled out. Specific contraindications are: []  Serum creatinine ? 1.5 for males [x]  Serum creatinine ? 1.4 for females []  Shock, acute MI, sepsis, hypoxemia, dehydration []  Planned administration of intravenous iodinated contrast media []  Heart Failure patients with low EF []  Acute or chronic metabolic acidosis (including DKA)

## 2014-01-08 NOTE — Discharge Summary (Signed)
Physician Discharge Summary  Patient ID: [name] MRN: [id number] DOB/AGE: Aug 31, 1940 73 y.o.  Admit date: 01/07/2014 Discharge date: 01/08/2014  Primary Discharge Diagnosis 1. CAD native coronary vessel with angina pectoris 2. PTCA and stenting of the distal RCA with implantation of a 2.5 x 14 mm resolute integrity DES.  Secondary Discharge Diagnosis Bilateral carotid bruits  Carotid artery duplex 12/04/2013: No evidence of hemodynamically significant stenosis in the bilateral carotid bifurcation vessels. There is evidence of minimal heterogeneous plaque in the bilateral carotid artery.  Type 2 diabetes, controlled, with neuropathy and nephropathy stage 3. Essential hypertension (I10)  Significant Diagnostic Studies: 01/08/2014:  Hemodynamic data: Aortic pressure was 154/59 with a mean of 94 mm mercury.  Left ventricle: Not Performed. There is mild pericardial calcification noted in the inferior wall.  Right coronary artery: Dominant. It has an anterior origin. Gives origin to large PDA and a moderate size file. There is mild luminal irregularity the proximal segment. At the bifurcation, there is a high-grade 99% stenosis.   Left main coronary artery is large and normal.  Circumflex coronary artery: A large vessel giving origin to a moderate sized obtuse marginal 1. There is mild luminal irregularity and mild calcification the proximal segment. After the bifurcation of the OM1, the circumflex has 20% stenosis which is diffuse.  LAD:  LAD gives origin to a large diagonal-1.  LAD has mild luminal irregularities.   Impression: High-grade stenosis of the distal RCA, constituting 99% stenosis.  Interventional data: Successful PTCA and stenting of the distal RCA with implantation of a 2.5 x 14 mm resolute integrity DES. Will need Dual antiplatelet therapy with Plavix and ASA 81 mg for at least one year. Patient is on dipyridamole aspirin combination, will consider discontinuation  of dipyridamole.   Outpatient Exercise myoview stress 12/09/2013: 1. The resting electrocardiogram demonstrated normal sinus rhythm, normal resting conduction, VPC and normal rest repolarization. The stress electrocardiogram revealed 2.5 mm of downsloping ST depression in lead (s):II, III, aVF, V5, V6 consistent with myocardial ischemia. The patient performed treadmill exercise using a Bruce protocol, completing 6 minutes. The patient completed an estimated workload of 7.3 METS. The stress test was terminated because of dyspnea. 2. SPECT myocardial perfusion imaging after adequate exercise is abnormal. There is a moderate area of severe ischemia in the basal inferior, mid inferior and apical inferior and lateral myocardial wall(s). The left ventricular ejection fraction was calculated or visually estimated to be 50% with inferior hypokinesis. This is an intermediate risk study. Impression: EKG 12/03/2013: Normal sinus rhythm, left atrial abnormality, ventricular rate 55 bpm, inferior ischemia. Normal QT interval.  Hospital Course:  [name] is a 72 year-old African-American female with hypertension, hyperlipidemia, diabetes, and h/o CVA presenting for evaluation of chest pain on an outpatient basis ongoing for the past 2 months.  In spite of aggressive medical therapy, due to continued chest discomfort, patient was scheduled for patient coronary angiography and possible angioplasty.  She was found to have a very high-grade stenosis in the right coronary artery for which she underwent complex but successful angioplasty.  She also has chronic renal insufficiency.  Her serum creatinine remained stable, the following morning felt stable for discharge.  Recommendations on discharge: I have discontinued ASA/Persantine combination and started her on Plavix 75 mg by mouth daily along with aspirin 81 mg by mouth daily.  Discharge Exam: Blood pressure 153/40, pulse 58, temperature 98.5 F (36.9 C), temperature  source Oral, resp. rate 18, height 5' 2.5" (1.588 m), weight  82 kg (180 lb 12.4 oz), SpO2 97 %.  Cardiovascular Inspection:Jugular vein- Right- No Distention. Auscultation:Heart Sounds- S1 WNL, S2 WNL and No gallop present. Murmurs & Other Heart Sounds: Murmur:Location- Sternal Border - Right. Timing- Mid-systolic. Grade- III/VI. Radiation- Carotids.   Abdomen Palpation/Percussion:Palpation and Percussion of the abdomen reveal - Non Tender and No hepatosplenomegaly. Auscultation:Auscultation of the abdomen reveals - Bowel sounds normal.   Peripheral Vascular Lower Extremity:Inspection- Left- No Pigmentation or Varicose veins. Right- No Pigmentation or Varicose veins. Palpation:Edema- Left- No edema. Right- No edema. Femoral pulse- Left- Normal. Right- Normal. Popliteal pulse- Bilateral- 0+ (could not palpated due to bodily habitus). Dorsalis pedis pulse- Left- 0+. Right- 2+. Posterior tibial pulse- Left- 0+. Right- 2+. Carotid arteries- Bilateral- Soft Bruit. Abdomen- No prominent abdominal aortic pulsation or epigastric bruit.  Right radial artery access no complication. Labs:   Lab Results  Component Value Date   WBC 7.8 01/08/2014   HGB 10.7* 01/08/2014   HCT 33.5* 01/08/2014   MCV 91.3 01/08/2014   PLT 211 01/08/2014    Recent Labs Lab 01/08/14 0308  NA 138  K 4.4  CL 111  CO2 21  BUN 13  CREATININE 1.64*  CALCIUM 8.3*  GLUCOSE 113*   Lab Results  Component Value Date   CKTOTAL 170 11/06/2006   CKMB 2.1 11/06/2006   TROPONINI 0.01        NO INDICATION OF MYOCARDIAL INJURY. 11/06/2006    Lipid Panel     Component Value Date/Time   CHOL  07/23/2009 0419    84        ATP III CLASSIFICATION:  <200     mg/dL   Desirable  200-239  mg/dL   Borderline High  >=240    mg/dL   High          TRIG 44 07/23/2009 0419   HDL 52 07/23/2009 0419   CHOLHDL 1.6 07/23/2009 0419   VLDL 9 07/23/2009 0419   LDLCALC  07/23/2009 0419     23        Total Cholesterol/HDL:CHD Risk Coronary Heart Disease Risk Table                     Men   Women  1/2 Average Risk   3.4   3.3  Average Risk       5.0   4.4  2 X Average Risk   9.6   7.1  3 X Average Risk  23.4   11.0        Use the calculated Patient Ratio above and the CHD Risk Table to determine the patient's CHD Risk.        ATP III CLASSIFICATION (LDL):  <100     mg/dL   Optimal  100-129  mg/dL   Near or Above                    Optimal  130-159  mg/dL   Borderline  160-189  mg/dL   High  >190     mg/dL   Very High    EKG: normal EKG, normal sinus rhythm, unchanged from previous tracings.    Radiology: No results found.    FOLLOW UP PLANS AND APPOINTMENTS Discharge Instructions    Discharge patient    Complete by:  As directed             Medication List    STOP taking these medications        AGGRENOX  200-25 MG per 12 hr capsule  Generic drug:  dipyridamole-aspirin     ramipril 5 MG capsule  Commonly known as:  ALTACE      TAKE these medications        aspirin 81 MG tablet  Take 1 tablet (81 mg total) by mouth daily.     B-D INS SYR ULTRAFINE 1CC/30G 30G X 1/2" 1 ML Misc  Generic drug:  Insulin Syringe-Needle U-100  Inject 1 each into the skin 3 (three) times daily between meals.     clopidogrel 75 MG tablet  Commonly known as:  PLAVIX  Take 1 tablet (75 mg total) by mouth daily with breakfast.     colchicine 0.6 MG tablet  Take 0.6 mg by mouth daily.     CRESTOR 10 MG tablet  Generic drug:  rosuvastatin  Take 10 mg by mouth daily.     famotidine 40 MG tablet  Commonly known as:  PEPCID  Take 1 tablet (40 mg total) by mouth 2 (two) times daily.     febuxostat 40 MG tablet  Commonly known as:  ULORIC  Take 40 mg by mouth daily.     folic acid 1 MG tablet  Commonly known as:  FOLVITE  Take 1 mg by mouth daily.     insulin NPH-regular Human (70-30) 100 UNIT/ML injection  Commonly known as:  NOVOLIN 70/30  20 units  subcutaneously in the morning, 40 units subcutaneously in the evening     isosorbide mononitrate 60 MG 24 hr tablet  Commonly known as:  IMDUR  Take 60 mg by mouth daily.     KAZANO 12.05-998 MG Tabs  Generic drug:  Alogliptin-Metformin HCl  Take 12.5-1,000 tablets by mouth 2 (two) times daily.     metoprolol succinate 50 MG 24 hr tablet  Commonly known as:  TOPROL-XL  Take 50 mg by mouth 2 (two) times daily.     NIASPAN 500 MG CR tablet  Generic drug:  niacin  Take 500 mg by mouth daily.     NITROSTAT 0.4 MG SL tablet  Generic drug:  nitroGLYCERIN  Place 0.4 mg under the tongue every 5 (five) minutes as needed for chest pain.     nortriptyline 10 MG capsule  Commonly known as:  PAMELOR  Take 10 mg by mouth daily.     ONE TOUCH ULTRA TEST test strip  Generic drug:  glucose blood  1 each by Other route 3 (three) times daily between meals.     PRISTIQ 100 MG 24 hr tablet  Generic drug:  desvenlafaxine  Take 100 mg by mouth every morning.     torsemide 20 MG tablet  Commonly known as:  DEMADEX  Take 20 mg by mouth daily.     verapamil 120 MG CR tablet  Commonly known as:  CALAN-SR  Take 120 mg by mouth daily.     Vitamin B Complex Tabs  Take 1 tablet by mouth daily.           Follow-up Information    Follow up with Laverda Page, MD. Go on 01/16/2014.   Specialty:  Cardiology   Why:  at 12:00   Contact information:   [place] [place] [address] [phone]        Laverda Page, MD 01/08/2014, 8:57 AM  Pager: [phone] Office: [phone] If no answer: [phone]

## 2014-01-08 NOTE — Discharge Instructions (Signed)
Coronary Angiogram With Stent, Care After °Refer to this sheet in the next few weeks. These instructions provide you with information on caring for yourself after your procedure. Your health care provider may also give you more specific instructions. Your treatment has been planned according to current medical practices, but problems sometimes occur. Call your health care provider if you have any problems or questions after your procedure.  °WHAT TO EXPECT AFTER THE PROCEDURE  °The insertion site may be tender for a few days after your procedure. °HOME CARE INSTRUCTIONS  °· Take medicines only as directed by your health care provider. Blood thinners may be prescribed after your procedure to improve blood flow through the stent. °· Change any bandages (dressings) as directed by your health care provider.   °· Check your insertion site every day for redness, swelling, or fluid leaking from the insertion.   °· Do not take baths, swim, or use a hot tub until your health care provider approves. You may shower. Pat the insertion area dry. Do not rub the insertion area with a washcloth or towel.   °· Eat a heart-healthy diet. This should include plenty of fresh fruits and vegetables. Meat should be lean cuts. Avoid the following types of food:   °¨ Food that is high in salt.   °¨ Canned or highly processed food.   °¨ Food that is high in saturated fat or sugar.   °¨ Fried food.   °· Make any other lifestyle changes recommended by your health care provider. This may include:   °¨ Not using any tobacco products including cigarettes, chewing tobacco, or electronic cigarettes.  °¨ Managing your weight.   °¨ Getting regular exercise.   °¨ Managing your blood pressure.   °¨ Limiting your alcohol intake.   °¨ Managing other health problems, such as diabetes.   °· If you need an MRI after your heart stent was placed, be sure to tell the health care provider who orders the MRI that you have a heart stent.   °· Keep all follow-up  visits as directed by your health care provider.   °SEEK IMMEDIATE MEDICAL CARE IF:  °· You develop chest pain, shortness of breath, feel faint, or pass out. °· You have bleeding, swelling larger than a walnut, or drainage from the catheter insertion site. °· You develop pain, discoloration, coldness, or severe bruising in the leg or arm that held the catheter. °· You develop bleeding from any other place such as from the bowels. There may be bright red blood in the urine or stools, or it may appear as black, tarry stools. °· You have a fever or chills. °MAKE SURE YOU: °· Understand these instructions. °· Will watch your condition. °· Will get help right away if you are not doing well or get worse. °Document Released: 07/23/2004 Document Revised: 05/20/2013 Document Reviewed: 06/06/2012 °ExitCare® Patient Information ©2015 [employer]. This information is not intended to replace advice given to you by your health care provider. Make sure you discuss any questions you have with your health care provider. ° °

## 2014-01-30 ENCOUNTER — Telehealth: Payer: Self-pay | Admitting: Neurology

## 2014-01-30 ENCOUNTER — Ambulatory Visit: Payer: Self-pay | Admitting: Neurology

## 2014-01-30 NOTE — Telephone Encounter (Signed)
This patient canceled her appointment the day of the appointment time.

## 2015-07-14 ENCOUNTER — Other Ambulatory Visit: Payer: Self-pay | Admitting: Nephrology

## 2015-07-14 DIAGNOSIS — N184 Chronic kidney disease, stage 4 (severe): Secondary | ICD-10-CM

## 2015-07-20 ENCOUNTER — Ambulatory Visit
Admission: RE | Admit: 2015-07-20 | Discharge: 2015-07-20 | Disposition: A | Discharge status: Home / Self Care | Payer: Medicare Other | Source: Ambulatory Visit | Attending: Nephrology | Admitting: Nephrology

## 2015-07-20 DIAGNOSIS — N184 Chronic kidney disease, stage 4 (severe): Secondary | ICD-10-CM

## 2015-08-17 ENCOUNTER — Other Ambulatory Visit: Payer: Self-pay | Admitting: Nephrology

## 2015-08-17 DIAGNOSIS — N2581 Secondary hyperparathyroidism of renal origin: Secondary | ICD-10-CM

## 2015-08-17 DIAGNOSIS — E1122 Type 2 diabetes mellitus with diabetic chronic kidney disease: Secondary | ICD-10-CM

## 2015-08-17 DIAGNOSIS — N185 Chronic kidney disease, stage 5: Secondary | ICD-10-CM

## 2015-08-17 DIAGNOSIS — M109 Gout, unspecified: Secondary | ICD-10-CM

## 2015-08-17 DIAGNOSIS — N189 Chronic kidney disease, unspecified: Secondary | ICD-10-CM

## 2015-08-17 DIAGNOSIS — N184 Chronic kidney disease, stage 4 (severe): Secondary | ICD-10-CM

## 2015-08-17 DIAGNOSIS — E785 Hyperlipidemia, unspecified: Secondary | ICD-10-CM

## 2015-08-17 DIAGNOSIS — E663 Overweight: Secondary | ICD-10-CM

## 2015-08-17 DIAGNOSIS — D631 Anemia in chronic kidney disease: Secondary | ICD-10-CM

## 2015-08-17 DIAGNOSIS — I120 Hypertensive chronic kidney disease with stage 5 chronic kidney disease or end stage renal disease: Secondary | ICD-10-CM

## 2015-09-02 ENCOUNTER — Ambulatory Visit
Admission: RE | Admit: 2015-09-02 | Discharge: 2015-09-02 | Disposition: A | Discharge status: Home / Self Care | Payer: Medicare Other | Source: Ambulatory Visit | Attending: Nephrology | Admitting: Nephrology

## 2015-09-02 ENCOUNTER — Other Ambulatory Visit: Payer: Self-pay | Admitting: Nephrology

## 2015-09-02 DIAGNOSIS — D631 Anemia in chronic kidney disease: Secondary | ICD-10-CM

## 2015-09-02 DIAGNOSIS — E1122 Type 2 diabetes mellitus with diabetic chronic kidney disease: Secondary | ICD-10-CM

## 2015-09-02 DIAGNOSIS — N2581 Secondary hyperparathyroidism of renal origin: Secondary | ICD-10-CM

## 2015-09-02 DIAGNOSIS — I120 Hypertensive chronic kidney disease with stage 5 chronic kidney disease or end stage renal disease: Secondary | ICD-10-CM

## 2015-09-02 DIAGNOSIS — N189 Chronic kidney disease, unspecified: Secondary | ICD-10-CM

## 2015-09-02 DIAGNOSIS — N185 Chronic kidney disease, stage 5: Secondary | ICD-10-CM

## 2015-09-02 DIAGNOSIS — E785 Hyperlipidemia, unspecified: Secondary | ICD-10-CM

## 2015-09-02 DIAGNOSIS — N184 Chronic kidney disease, stage 4 (severe): Secondary | ICD-10-CM

## 2015-09-02 DIAGNOSIS — E663 Overweight: Secondary | ICD-10-CM

## 2015-09-02 DIAGNOSIS — M109 Gout, unspecified: Secondary | ICD-10-CM

## 2015-09-03 ENCOUNTER — Other Ambulatory Visit: Payer: Self-pay

## 2015-09-15 ENCOUNTER — Ambulatory Visit
Admission: RE | Admit: 2015-09-15 | Discharge: 2015-09-15 | Disposition: A | Discharge status: Home / Self Care | Payer: Medicare Other | Source: Ambulatory Visit | Attending: Nephrology | Admitting: Nephrology

## 2015-09-15 ENCOUNTER — Encounter: Payer: Medicare Other | Attending: Nephrology | Admitting: Dietician

## 2015-09-15 ENCOUNTER — Encounter: Payer: Self-pay | Admitting: Dietician

## 2015-09-15 DIAGNOSIS — E1122 Type 2 diabetes mellitus with diabetic chronic kidney disease: Secondary | ICD-10-CM | POA: Diagnosis not present

## 2015-09-15 DIAGNOSIS — N184 Chronic kidney disease, stage 4 (severe): Secondary | ICD-10-CM

## 2015-09-15 DIAGNOSIS — E785 Hyperlipidemia, unspecified: Secondary | ICD-10-CM

## 2015-09-15 DIAGNOSIS — Z713 Dietary counseling and surveillance: Secondary | ICD-10-CM | POA: Insufficient documentation

## 2015-09-15 DIAGNOSIS — N189 Chronic kidney disease, unspecified: Secondary | ICD-10-CM | POA: Insufficient documentation

## 2015-09-15 DIAGNOSIS — D631 Anemia in chronic kidney disease: Secondary | ICD-10-CM

## 2015-09-15 DIAGNOSIS — N2581 Secondary hyperparathyroidism of renal origin: Secondary | ICD-10-CM

## 2015-09-15 DIAGNOSIS — I120 Hypertensive chronic kidney disease with stage 5 chronic kidney disease or end stage renal disease: Secondary | ICD-10-CM

## 2015-09-15 DIAGNOSIS — Z794 Long term (current) use of insulin: Secondary | ICD-10-CM

## 2015-09-15 DIAGNOSIS — N185 Chronic kidney disease, stage 5: Secondary | ICD-10-CM

## 2015-09-15 DIAGNOSIS — M109 Gout, unspecified: Secondary | ICD-10-CM

## 2015-09-15 DIAGNOSIS — E663 Overweight: Secondary | ICD-10-CM

## 2015-09-15 DIAGNOSIS — E118 Type 2 diabetes mellitus with unspecified complications: Secondary | ICD-10-CM

## 2015-09-15 NOTE — Progress Notes (Signed)
Medical Nutrition Therapy:  Appt start time: 1400 end time:  1500.   Assessment:  Primary concerns today: Patient is here today with her niece.  She would like to learn the safest foods to eat to protect her kidneys.  Referral for CKD stage 4, Type 2 diabetes (since 1978), for education to better control her blood sugar and a low sodium, low phosphorus diet.  Other hx includes hyperlipidemia, secondary hyperparathyroidism, and gout.  GFR 25-30, A1C 6.6%.  She gets her eyes checked and goes to the dentist at least once yearly and checks her feet daily.  She has occasional hypoglycemia and had to call EMS recently.  Her medications were changed.  CBG's running 61-98 in the am.  Patient lives with her niece and daughter.  Patient does most of the shopping and cooking.  Her niece does a little as well.  She is retired and was a Sport and exercise psychologist. She has been following a low sodium, low potassium, low phosphorus diet with diabetic guidelines.  Preferred Learning Style:   No preference indicated   Learning Readiness:   Ready  MEDICATIONS: see list to include:  Novolin 70/30 - 30 units in the am and 40 units with dinner.  She also has a prescription for Novolog but has not used this yet and does not know how much to use.   DIETARY INTAKE:  Usual eating pattern includes 3 meals and 1-2 snacks per day.  Everyday foods include chicken, [place], fish, Avoided foods include added salt, canned vegetables, rarely eats out, no processed foods.  24-hr recall:  B ( AM): egg whites, [place] bacon or sausage and occasional fruit   OR shredded wheat with almond milk AND coffee and Non dairy creamer.  Snk ( AM): occasional fruit  L ( PM): fresh [place] burger or [place] or fish, [employer] bread, lettuce Snk ( PM): occasional fruit D ( PM): [place] burger or fish or [place], rice, kale or other vegetable Snk ( PM): occasional sherbet Beverages: water, coffee with Non dairy creamer, occasional  Home made  unsweetened green tea with lemon and splenda  Usual physical activity: goes to the gym [employer]) and has a treadmill.  She exercises 3-4 times per week for 30-60 minutes plus more on her days off at home. She currently goes to [place] but will change to the local [employer] because it is 1 mile away.      Estimated energy needs: 2000 calories 50 g protein  Progress Towards Goal(s):  In progress.   Nutritional Diagnosis:  NB-1.1 Food and nutrition-related knowledge deficit As related to renal diabetic diet.  As evidenced by patient report.    Intervention:  Nutrition education related to a low phosphorus, low sodium, diabetic diet.  Patient had been reading on the [facility] web site and had been following this but felt she needed education to improve her confident.  Did discuss recommendation to change from whole grain to refined rice, cereal, etc.  Discussed her intake to balance carbohydrate. Taught the Rule of 15 for treatment of low blood sugar as well as symptoms.  Remember the "Rule of 15" in treatment of low blood sugar. Do not skip meals. Continue the low sodium, low phosphorus diet. Continue to be mindful about your sugar intake. Continue your active lifestyle.   National Kidney foundation = www.kidney.http://skinner-smith.org/ Towanda Octave is also an excellent resource.  Teaching Method Utilized:  Visual Auditory Hands on  Handouts given during visit include:  Choose a meal booklet  Low  protein Recipes from the [employer]  Barriers to learning/adherence to lifestyle change: none  Demonstrated degree of understanding via:  Teach Back   Monitoring/Evaluation:  Dietary intake, exercise, and body weight prn.

## 2015-09-15 NOTE — Patient Instructions (Addendum)
Remember the "Rule of 15" in treatment of low blood sugar. Do not skip meals. Continue the low sodium, low phosphorus diet. Continue to be mindful about your sugar intake. Continue your active lifestyle.   National Kidney foundation = www.kidney.http://skinner-smith.org/ [name] is also an excellent resource.

## 2015-09-17 ENCOUNTER — Encounter: Payer: Self-pay | Admitting: Dietician

## 2016-03-01 ENCOUNTER — Other Ambulatory Visit: Payer: Self-pay | Admitting: Nephrology

## 2016-03-02 ENCOUNTER — Other Ambulatory Visit: Payer: Self-pay | Admitting: Nephrology

## 2016-03-02 DIAGNOSIS — N2889 Other specified disorders of kidney and ureter: Secondary | ICD-10-CM

## 2016-03-11 ENCOUNTER — Ambulatory Visit
Admission: RE | Admit: 2016-03-11 | Discharge: 2016-03-11 | Disposition: A | Discharge status: Home / Self Care | Payer: Medicare Other | Source: Ambulatory Visit | Attending: Nephrology | Admitting: Nephrology

## 2016-03-11 DIAGNOSIS — N2889 Other specified disorders of kidney and ureter: Secondary | ICD-10-CM

## 2016-06-24 ENCOUNTER — Telehealth (HOSPITAL_COMMUNITY): Payer: Self-pay | Admitting: *Deleted

## 2016-06-24 NOTE — Telephone Encounter (Signed)
Pt accompanied a cardiac rehab participant to rehab today.  Pt asked about participating in cardiac rehab.  Pt had a stent about 2 years ago.  Permission given to access medical records.  Pt had stent placed in 12/2013.  Advised pt that she would be eligible to attend the self pay cardiac rehab maintenance program.  Pt indicated she wished to proceed.  Maintenance referral/order sent to [facility].  Advised pt that once we received the referral/order, she would be contacted for scheduling.  Pt is in agreement of this.

## 2016-07-11 ENCOUNTER — Encounter (HOSPITAL_COMMUNITY)
Admission: RE | Admit: 2016-07-11 | Discharge: 2016-07-11 | Disposition: A | Discharge status: Home / Self Care | Payer: Self-pay | Source: Ambulatory Visit | Attending: Cardiology | Admitting: Cardiology

## 2016-07-11 DIAGNOSIS — N189 Chronic kidney disease, unspecified: Secondary | ICD-10-CM | POA: Insufficient documentation

## 2016-07-11 DIAGNOSIS — E1122 Type 2 diabetes mellitus with diabetic chronic kidney disease: Secondary | ICD-10-CM | POA: Insufficient documentation

## 2016-07-11 LAB — GLUCOSE, CAPILLARY: Glucose-Capillary: 193 mg/dL — ABNORMAL HIGH (ref 65–99)

## 2016-07-13 ENCOUNTER — Encounter (HOSPITAL_COMMUNITY)
Admission: RE | Admit: 2016-07-13 | Discharge: 2016-07-13 | Disposition: A | Discharge status: Home / Self Care | Payer: Self-pay | Source: Ambulatory Visit | Attending: Cardiology | Admitting: Cardiology

## 2016-07-15 ENCOUNTER — Encounter (HOSPITAL_COMMUNITY): Payer: Self-pay

## 2016-07-18 ENCOUNTER — Encounter (HOSPITAL_COMMUNITY)
Admission: RE | Admit: 2016-07-18 | Discharge: 2016-07-18 | Disposition: A | Discharge status: Home / Self Care | Payer: Self-pay | Source: Ambulatory Visit | Attending: Cardiology | Admitting: Cardiology

## 2016-07-18 DIAGNOSIS — E1122 Type 2 diabetes mellitus with diabetic chronic kidney disease: Secondary | ICD-10-CM | POA: Insufficient documentation

## 2016-07-18 DIAGNOSIS — N189 Chronic kidney disease, unspecified: Secondary | ICD-10-CM | POA: Insufficient documentation

## 2016-07-22 ENCOUNTER — Encounter (HOSPITAL_COMMUNITY): Payer: Self-pay

## 2016-07-25 ENCOUNTER — Encounter (HOSPITAL_COMMUNITY)
Admission: RE | Admit: 2016-07-25 | Discharge: 2016-07-25 | Disposition: A | Discharge status: Home / Self Care | Payer: Self-pay | Source: Ambulatory Visit | Attending: Cardiology | Admitting: Cardiology

## 2016-07-27 ENCOUNTER — Encounter (HOSPITAL_COMMUNITY)
Admission: RE | Admit: 2016-07-27 | Discharge: 2016-07-27 | Disposition: A | Discharge status: Home / Self Care | Payer: Self-pay | Source: Ambulatory Visit | Attending: Cardiology | Admitting: Cardiology

## 2016-07-29 ENCOUNTER — Encounter (HOSPITAL_COMMUNITY)
Admission: RE | Admit: 2016-07-29 | Discharge: 2016-07-29 | Disposition: A | Discharge status: Home / Self Care | Payer: Self-pay | Source: Ambulatory Visit | Attending: Cardiology | Admitting: Cardiology

## 2016-08-01 ENCOUNTER — Encounter (HOSPITAL_COMMUNITY): Payer: Self-pay

## 2016-08-03 ENCOUNTER — Encounter (HOSPITAL_COMMUNITY): Payer: Self-pay

## 2016-08-05 ENCOUNTER — Encounter (HOSPITAL_COMMUNITY)
Admission: RE | Admit: 2016-08-05 | Discharge: 2016-08-05 | Disposition: A | Discharge status: Home / Self Care | Payer: Self-pay | Source: Ambulatory Visit | Attending: Cardiology | Admitting: Cardiology

## 2016-08-08 ENCOUNTER — Encounter (HOSPITAL_COMMUNITY)
Admission: RE | Admit: 2016-08-08 | Discharge: 2016-08-08 | Disposition: A | Discharge status: Home / Self Care | Payer: Self-pay | Source: Ambulatory Visit | Attending: Cardiology | Admitting: Cardiology

## 2016-08-10 ENCOUNTER — Encounter (HOSPITAL_COMMUNITY)
Admission: RE | Admit: 2016-08-10 | Discharge: 2016-08-10 | Disposition: A | Discharge status: Home / Self Care | Payer: Self-pay | Source: Ambulatory Visit | Attending: Cardiology | Admitting: Cardiology

## 2016-08-12 ENCOUNTER — Encounter (HOSPITAL_COMMUNITY)
Admission: RE | Admit: 2016-08-12 | Discharge: 2016-08-12 | Disposition: A | Discharge status: Home / Self Care | Payer: Self-pay | Source: Ambulatory Visit | Attending: Cardiology | Admitting: Cardiology

## 2016-08-15 ENCOUNTER — Encounter (HOSPITAL_COMMUNITY): Payer: Self-pay

## 2016-08-17 ENCOUNTER — Encounter (HOSPITAL_COMMUNITY)
Admission: RE | Admit: 2016-08-17 | Discharge: 2016-08-17 | Disposition: A | Discharge status: Home / Self Care | Payer: Medicare Other | Source: Ambulatory Visit | Attending: Cardiology | Admitting: Cardiology

## 2016-08-17 DIAGNOSIS — E1122 Type 2 diabetes mellitus with diabetic chronic kidney disease: Secondary | ICD-10-CM | POA: Insufficient documentation

## 2016-08-17 DIAGNOSIS — N189 Chronic kidney disease, unspecified: Secondary | ICD-10-CM | POA: Diagnosis present

## 2016-08-19 ENCOUNTER — Encounter (HOSPITAL_COMMUNITY): Payer: Medicare Other

## 2016-08-22 ENCOUNTER — Encounter (HOSPITAL_COMMUNITY): Payer: Medicare Other

## 2016-08-24 ENCOUNTER — Encounter (HOSPITAL_COMMUNITY): Payer: Medicare Other

## 2016-08-26 ENCOUNTER — Encounter (HOSPITAL_COMMUNITY): Payer: Medicare Other

## 2016-08-29 ENCOUNTER — Encounter (HOSPITAL_COMMUNITY): Payer: Medicare Other

## 2016-08-31 ENCOUNTER — Encounter (HOSPITAL_COMMUNITY): Payer: Medicare Other

## 2016-09-02 ENCOUNTER — Encounter (HOSPITAL_COMMUNITY): Payer: Medicare Other

## 2016-09-05 ENCOUNTER — Encounter (HOSPITAL_COMMUNITY): Payer: Medicare Other

## 2016-09-07 ENCOUNTER — Encounter (HOSPITAL_COMMUNITY): Payer: Medicare Other

## 2016-09-09 ENCOUNTER — Encounter (HOSPITAL_COMMUNITY): Payer: Medicare Other

## 2016-09-12 ENCOUNTER — Encounter (HOSPITAL_COMMUNITY): Payer: Medicare Other

## 2016-09-14 ENCOUNTER — Encounter (HOSPITAL_COMMUNITY): Payer: Medicare Other

## 2016-09-16 ENCOUNTER — Encounter (HOSPITAL_COMMUNITY): Payer: Medicare Other

## 2016-09-21 ENCOUNTER — Encounter (HOSPITAL_COMMUNITY): Payer: Medicare Other | Attending: Cardiology

## 2016-09-21 DIAGNOSIS — E1122 Type 2 diabetes mellitus with diabetic chronic kidney disease: Secondary | ICD-10-CM | POA: Insufficient documentation

## 2016-09-21 DIAGNOSIS — N189 Chronic kidney disease, unspecified: Secondary | ICD-10-CM | POA: Insufficient documentation

## 2016-09-23 ENCOUNTER — Encounter (HOSPITAL_COMMUNITY): Payer: Medicare Other

## 2016-09-26 ENCOUNTER — Encounter (HOSPITAL_COMMUNITY): Payer: Medicare Other

## 2016-09-28 ENCOUNTER — Encounter (HOSPITAL_COMMUNITY): Payer: Medicare Other

## 2016-09-30 ENCOUNTER — Encounter (HOSPITAL_COMMUNITY): Payer: Medicare Other

## 2016-10-03 ENCOUNTER — Encounter (HOSPITAL_COMMUNITY): Payer: Medicare Other

## 2016-10-05 ENCOUNTER — Encounter (HOSPITAL_COMMUNITY): Payer: Medicare Other

## 2016-10-07 ENCOUNTER — Encounter (HOSPITAL_COMMUNITY): Payer: Medicare Other

## 2016-10-10 ENCOUNTER — Encounter (HOSPITAL_COMMUNITY): Payer: Medicare Other

## 2016-10-12 ENCOUNTER — Encounter (HOSPITAL_COMMUNITY): Payer: Medicare Other

## 2016-10-14 ENCOUNTER — Encounter (HOSPITAL_COMMUNITY): Payer: Medicare Other

## 2016-10-17 ENCOUNTER — Encounter (HOSPITAL_COMMUNITY): Payer: Medicare Other | Attending: Cardiology

## 2016-10-17 DIAGNOSIS — N189 Chronic kidney disease, unspecified: Secondary | ICD-10-CM | POA: Insufficient documentation

## 2016-10-17 DIAGNOSIS — E1122 Type 2 diabetes mellitus with diabetic chronic kidney disease: Secondary | ICD-10-CM | POA: Insufficient documentation

## 2016-10-19 ENCOUNTER — Encounter (HOSPITAL_COMMUNITY): Payer: Medicare Other

## 2016-10-21 ENCOUNTER — Encounter (HOSPITAL_COMMUNITY): Payer: Medicare Other

## 2016-10-24 ENCOUNTER — Encounter (HOSPITAL_COMMUNITY): Payer: Medicare Other

## 2016-10-26 ENCOUNTER — Encounter (HOSPITAL_COMMUNITY): Payer: Medicare Other

## 2016-10-28 ENCOUNTER — Encounter (HOSPITAL_COMMUNITY): Payer: Medicare Other

## 2016-10-31 ENCOUNTER — Encounter (HOSPITAL_COMMUNITY): Payer: Medicare Other

## 2016-11-02 ENCOUNTER — Encounter (HOSPITAL_COMMUNITY): Payer: Medicare Other

## 2016-11-04 ENCOUNTER — Encounter (HOSPITAL_COMMUNITY): Payer: Medicare Other

## 2016-11-07 ENCOUNTER — Encounter (HOSPITAL_COMMUNITY): Payer: Medicare Other

## 2016-11-09 ENCOUNTER — Encounter (HOSPITAL_COMMUNITY): Payer: Medicare Other

## 2016-11-11 ENCOUNTER — Encounter (HOSPITAL_COMMUNITY): Payer: Medicare Other

## 2016-11-14 ENCOUNTER — Encounter (HOSPITAL_COMMUNITY): Payer: Medicare Other

## 2016-11-16 ENCOUNTER — Encounter (HOSPITAL_COMMUNITY): Payer: Medicare Other

## 2016-11-18 ENCOUNTER — Encounter (HOSPITAL_COMMUNITY): Payer: Medicare Other

## 2016-11-21 ENCOUNTER — Encounter (HOSPITAL_COMMUNITY): Payer: Medicare Other

## 2016-11-23 ENCOUNTER — Encounter (HOSPITAL_COMMUNITY): Payer: Medicare Other

## 2016-11-25 ENCOUNTER — Encounter (HOSPITAL_COMMUNITY): Payer: Medicare Other

## 2016-11-28 ENCOUNTER — Encounter (HOSPITAL_COMMUNITY): Payer: Medicare Other

## 2016-11-30 ENCOUNTER — Encounter (HOSPITAL_COMMUNITY): Payer: Medicare Other

## 2016-12-02 ENCOUNTER — Encounter (HOSPITAL_COMMUNITY): Payer: Medicare Other

## 2016-12-05 ENCOUNTER — Encounter (HOSPITAL_COMMUNITY): Payer: Medicare Other

## 2016-12-07 ENCOUNTER — Encounter (HOSPITAL_COMMUNITY): Payer: Medicare Other

## 2016-12-12 ENCOUNTER — Encounter (HOSPITAL_COMMUNITY): Payer: Medicare Other

## 2016-12-14 ENCOUNTER — Encounter (HOSPITAL_COMMUNITY): Payer: Medicare Other

## 2016-12-16 ENCOUNTER — Encounter (HOSPITAL_COMMUNITY): Payer: Medicare Other

## 2016-12-19 ENCOUNTER — Encounter (HOSPITAL_COMMUNITY): Payer: Medicare Other

## 2016-12-21 ENCOUNTER — Encounter (HOSPITAL_COMMUNITY): Payer: Medicare Other

## 2016-12-23 ENCOUNTER — Encounter (HOSPITAL_COMMUNITY): Payer: Medicare Other

## 2016-12-26 ENCOUNTER — Encounter (HOSPITAL_COMMUNITY): Payer: Medicare Other

## 2016-12-28 ENCOUNTER — Encounter (HOSPITAL_COMMUNITY): Payer: Medicare Other

## 2016-12-30 ENCOUNTER — Encounter (HOSPITAL_COMMUNITY): Payer: Medicare Other

## 2017-01-02 ENCOUNTER — Encounter (HOSPITAL_COMMUNITY): Payer: Medicare Other

## 2017-01-04 ENCOUNTER — Encounter (HOSPITAL_COMMUNITY): Payer: Medicare Other

## 2017-01-06 ENCOUNTER — Encounter (HOSPITAL_COMMUNITY): Payer: Medicare Other

## 2017-01-11 ENCOUNTER — Encounter (HOSPITAL_COMMUNITY): Payer: Medicare Other

## 2017-01-13 ENCOUNTER — Encounter (HOSPITAL_COMMUNITY): Payer: Medicare Other

## 2017-01-16 ENCOUNTER — Encounter (HOSPITAL_COMMUNITY): Payer: Medicare Other

## 2017-01-17 HISTORY — PX: EYE SURGERY: SHX253

## 2017-12-04 ENCOUNTER — Other Ambulatory Visit: Payer: Self-pay

## 2017-12-04 DIAGNOSIS — N185 Chronic kidney disease, stage 5: Secondary | ICD-10-CM

## 2017-12-05 ENCOUNTER — Other Ambulatory Visit: Payer: Self-pay

## 2017-12-05 ENCOUNTER — Ambulatory Visit (HOSPITAL_COMMUNITY)
Admission: RE | Admit: 2017-12-05 | Discharge: 2017-12-05 | Disposition: A | Discharge status: Home / Self Care | Payer: Medicare Other | Source: Ambulatory Visit | Attending: Vascular Surgery | Admitting: Vascular Surgery

## 2017-12-05 ENCOUNTER — Encounter: Payer: Self-pay | Admitting: Vascular Surgery

## 2017-12-05 ENCOUNTER — Ambulatory Visit (INDEPENDENT_AMBULATORY_CARE_PROVIDER_SITE_OTHER)
Admission: RE | Admit: 2017-12-05 | Discharge: 2017-12-05 | Disposition: A | Discharge status: Home / Self Care | Payer: Medicare Other | Source: Ambulatory Visit | Attending: Vascular Surgery | Admitting: Vascular Surgery

## 2017-12-05 ENCOUNTER — Ambulatory Visit (INDEPENDENT_AMBULATORY_CARE_PROVIDER_SITE_OTHER): Payer: Medicare Other | Admitting: Vascular Surgery

## 2017-12-05 DIAGNOSIS — N184 Chronic kidney disease, stage 4 (severe): Secondary | ICD-10-CM | POA: Diagnosis not present

## 2017-12-05 DIAGNOSIS — N185 Chronic kidney disease, stage 5: Secondary | ICD-10-CM | POA: Insufficient documentation

## 2017-12-05 DIAGNOSIS — N179 Acute kidney failure, unspecified: Secondary | ICD-10-CM | POA: Insufficient documentation

## 2017-12-05 NOTE — Progress Notes (Signed)
Patient name: [name] MRN: [id number] DOB: 1940/03/10 Sex: female  REASON FOR CONSULT: New access evaluation  HPI: [name] is a 77 y.o. female, history of diabetes and hypertension and CKD stage IV that presents for new AV fistula access evaluation.  Patient states she has stage IV chronic kidney disease and has been advised that she needs permanent access.  She denies any previous upper extremity AV fistula or graft.  She denies any previous catheters to her upper extremities.  She denies pacemaker.  She states she is right-hand dominant.  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  She sounds very functional otherwise.  No blood thinner other than aspirin.  Past Medical History:  Diagnosis Date  . Anginal pain ([place])   . Cerebrovascular disease   . CKD (chronic kidney disease)   . Coronary artery disease   . Depression   . Diabetes ([place])    INSULIN DEPENDENT  . Diabetic peripheral neuropathy ([place])   . Diabetic retinopathy ([place])   . Diverticulitis   . Dyslipidemia   . GERD (gastroesophageal reflux disease)   . Headache(784.0) 01/30/2013  . Heart murmur   . History of cerebrovascular accident 01/30/2013  . Hyperlipidemia   . Hypertension   . Migraine   . Obesity   . TIA (transient ischemic attack)    " MULTIPLE PER PATIENT "    Past Surgical History:  Procedure Laterality Date  . ABDOMINAL HYSTERECTOMY    . bilateral cataract surgery    . CARDIAC CATHETERIZATION  01/07/2014   DR Einar Gip  . LEFT HEART CATHETERIZATION WITH CORONARY ANGIOGRAM N/A 01/07/2014   Procedure: LEFT HEART CATHETERIZATION WITH CORONARY ANGIOGRAM;  Surgeon: Laverda Page, MD;  Location: [facility] CATH LAB;  Service: Cardiovascular;  Laterality: N/A;  . middle cerebral artery stent placement Right   . OTHER SURGICAL HISTORY     laser surgery  . PTCA  01/07/2014   DES to RCA    DR Einar Gip  . right knee surgery     for infection    Family History  Problem Relation Age of Onset  . Diabetes Mother     . Heart disease Mother   . Prostate cancer Father   . Hypertension Brother   . Prostate cancer Brother     SOCIAL HISTORY: Social History   Socioeconomic History  . Marital status: Widowed    Spouse name: Not on file  . Number of children: 4  . Years of education: PHD  . Highest education level: Not on file  Occupational History  . Not on file  Social Needs  . Financial resource strain: Not on file  . Food insecurity:    Worry: Not on file    Inability: Not on file  . Transportation needs:    Medical: Not on file    Non-medical: Not on file  Tobacco Use  . Smoking status: Never Smoker  . Smokeless tobacco: Never Used  Substance and Sexual Activity  . Alcohol use: No  . Drug use: No  . Sexual activity: Not on file  Lifestyle  . Physical activity:    Days per week: Not on file    Minutes per session: Not on file  . Stress: Not on file  Relationships  . Social connections:    Talks on phone: Not on file    Gets together: Not on file    Attends religious service: Not on file    Active member of club or  organization: Not on file    Attends meetings of clubs or organizations: Not on file    Relationship status: Not on file  . Intimate partner violence:    Fear of current or ex partner: Not on file    Emotionally abused: Not on file    Physically abused: Not on file    Forced sexual activity: Not on file  Other Topics Concern  . Not on file  Social History Narrative   Patient is widowed and lives with her two adopted children.   Patient is retired.   Patient has two children of her own and two adopted children.   Patient has a PHD.   Patient is right-handed.    No Known Allergies  Current Outpatient Medications  Medication Sig Dispense Refill  . aspirin 81 MG tablet Take 1 tablet (81 mg total) by mouth daily. 30 tablet   . B Complex Vitamins (VITAMIN B COMPLEX) TABS Take 1 tablet by mouth daily.    . B-D INS SYR ULTRAFINE 1CC/30G 30G X 1/2" 1 ML MISC  Inject 1 each into the skin 3 (three) times daily between meals.    . clopidogrel (PLAVIX) 75 MG tablet Take 1 tablet (75 mg total) by mouth daily with breakfast. 30 tablet 6  . colchicine 0.6 MG tablet Take 0.6 mg by mouth daily.    . CRESTOR 10 MG tablet Take 10 mg by mouth daily.    . famotidine (PEPCID) 40 MG tablet Take 1 tablet (40 mg total) by mouth 2 (two) times daily. 30 tablet 6  . folic acid (FOLVITE) 1 MG tablet Take 1 mg by mouth daily.    . insulin aspart (NOVOLOG) 100 UNIT/ML injection Inject into the skin 3 (three) times daily before meals.    . insulin NPH-regular Human (NOVOLIN 70/30) (70-30) 100 UNIT/ML injection 20 units subcutaneously in the morning, 40 units subcutaneously in the evening 10 mL   . isosorbide mononitrate (IMDUR) 60 MG 24 hr tablet Take 60 mg by mouth daily.  1  . metoprolol succinate (TOPROL-XL) 50 MG 24 hr tablet Take 50 mg by mouth 2 (two) times daily.     Marland Kitchen NIASPAN 500 MG CR tablet Take 500 mg by mouth daily.    Marland Kitchen NITROSTAT 0.4 MG SL tablet Place 0.4 mg under the tongue every 5 (five) minutes as needed for chest pain.   1  . nortriptyline (PAMELOR) 10 MG capsule Take 10 mg by mouth daily.    . ONE TOUCH ULTRA TEST test strip 1 each by Other route 3 (three) times daily between meals.    Marland Kitchen PRISTIQ 100 MG 24 hr tablet Take 100 mg by mouth every morning.     . torsemide (DEMADEX) 20 MG tablet Take 20 mg by mouth daily.    . febuxostat (ULORIC) 40 MG tablet Take 40 mg by mouth daily.    Marland Kitchen KAZANO 12.05-998 MG TABS Take 12.5-1,000 tablets by mouth 2 (two) times daily.    . verapamil (CALAN-SR) 120 MG CR tablet Take 120 mg by mouth daily.     No current facility-administered medications for this visit.     REVIEW OF SYSTEMS:  [X]  denotes positive finding, [ ]  denotes negative finding Cardiac  Comments:  Chest pain or chest pressure:    Shortness of breath upon exertion:    Short of breath when lying flat:    Irregular heart rhythm:        Vascular     Pain in  calf, thigh, or hip brought on by ambulation:    Pain in feet at night that wakes you up from your sleep:     Blood clot in your veins:    Leg swelling:         Pulmonary    Oxygen at home:    Productive cough:     Wheezing:         Neurologic    Sudden weakness in arms or legs:     Sudden numbness in arms or legs:     Sudden onset of difficulty speaking or slurred speech:    Temporary loss of vision in one eye:     Problems with dizziness:         Gastrointestinal    Blood in stool:     Vomited blood:         Genitourinary    Burning when urinating:     Blood in urine:        Psychiatric    Major depression:         Hematologic    Bleeding problems:    Problems with blood clotting too easily:        Skin    Rashes or ulcers:        Constitutional    Fever or chills:      PHYSICAL EXAM: Vitals:   12/05/17 1220  BP: (!) 96/50  Pulse: (!) 52  Resp: 18  Temp: (!) 97 F (36.1 C)  TempSrc: Oral  SpO2: 99%  Weight: 77 kg  Height: 5\' 3"  (1.6 m)    GENERAL: The patient is a well-nourished female, in no acute distress. The vital signs are documented above. CARDIAC: There is a regular rate and rhythm.  VASCULAR:  2+ radial pulse palpable bilateral upper extremities 2+ brachial pulse palpable bilateral upper extremities Motor and sensory intact to bilateral hands PULMONARY: There is good air exchange bilaterally without wheezing or rales. ABDOMEN: Soft and non-tender with normal pitched bowel sounds.  MUSCULOSKELETAL: There are no major deformities or cyanosis. NEUROLOGIC: No focal weakness or paresthesias are detected. SKIN: There are no ulcers or rashes noted.   DATA:   I independently reviewed her vein mapping which shows triphasic patent arteries to the bilateral upper extremities.  Unfortunately her radial artery on the left is small at 1.9 mm and her brachial artery is more robust at 4.4 mm.  Assessment/Plan:  77 year old female with stage  IV chronic kidney disease likely from diabetes and hypertension that presents for new AV fistula evaluation.  Patient is right-hand dominant so I have recommended a left upper extremity access procedure.  Given review of her vein mapping her radial artery at the wrist is very small at only 1.9 mm and so I think she would be best served with a left brachiocephalic fistula.  I discussed with the patient risk and benefits including bleeding, infection, steal,  need for additional procedure in the future, and access failure.  We will schedule for next available date.   Marty Heck, MD [facility] [place] Office: [phone] Pager: [place]

## 2017-12-08 ENCOUNTER — Other Ambulatory Visit: Payer: Self-pay | Admitting: *Deleted

## 2017-12-12 ENCOUNTER — Other Ambulatory Visit: Payer: Self-pay

## 2017-12-12 ENCOUNTER — Encounter (HOSPITAL_COMMUNITY): Payer: Self-pay | Admitting: *Deleted

## 2017-12-12 NOTE — Progress Notes (Signed)
[name] denies chest pain or shortness of breath.  Patient has Type II diabetes, she reports that CBG's run 70 -147. Patient does not remember her last A1C, "but the Dr did not complain."  [name] sees a new endocrinologist at [facility].  Patient's PCP is Dr. Ashby Dawes , I requested labs and last office visit.  Cardiologist is Dr Nadyne Coombes, I requested records. Patient is seen by Dr Deterding at [facility], I requested records. I instructed patient to take 6 units of Lantus tonight.  I instructed patient to check CBG after awaking and every 2 hours until arrival  to the hospital.  I Instructed patient if CBG is less than 70 to take 4 Glucose Tablets . Recheck CBG in 15 minutes then call pre- op desk at [phone] for further instructions.   I instructed patient that of CBG was greater than 220 to take 1/2 of SS Insulin.

## 2017-12-18 ENCOUNTER — Encounter (HOSPITAL_COMMUNITY)
Admission: RE | Disposition: A | Discharge status: Home / Self Care | Payer: Self-pay | Source: Ambulatory Visit | Attending: Vascular Surgery

## 2017-12-18 ENCOUNTER — Other Ambulatory Visit: Payer: Self-pay

## 2017-12-18 ENCOUNTER — Ambulatory Visit (HOSPITAL_COMMUNITY): Payer: Medicare Other | Admitting: Anesthesiology

## 2017-12-18 ENCOUNTER — Ambulatory Visit (HOSPITAL_COMMUNITY)
Admission: RE | Admit: 2017-12-18 | Discharge: 2017-12-18 | Disposition: A | Discharge status: Home / Self Care | Payer: Medicare Other | Source: Ambulatory Visit | Attending: Vascular Surgery | Admitting: Vascular Surgery

## 2017-12-18 ENCOUNTER — Telehealth: Payer: Self-pay | Admitting: Vascular Surgery

## 2017-12-18 ENCOUNTER — Encounter (HOSPITAL_COMMUNITY): Payer: Self-pay

## 2017-12-18 DIAGNOSIS — I129 Hypertensive chronic kidney disease with stage 1 through stage 4 chronic kidney disease, or unspecified chronic kidney disease: Secondary | ICD-10-CM | POA: Diagnosis not present

## 2017-12-18 DIAGNOSIS — Z79899 Other long term (current) drug therapy: Secondary | ICD-10-CM | POA: Insufficient documentation

## 2017-12-18 DIAGNOSIS — K219 Gastro-esophageal reflux disease without esophagitis: Secondary | ICD-10-CM | POA: Diagnosis not present

## 2017-12-18 DIAGNOSIS — F329 Major depressive disorder, single episode, unspecified: Secondary | ICD-10-CM | POA: Diagnosis not present

## 2017-12-18 DIAGNOSIS — Z7982 Long term (current) use of aspirin: Secondary | ICD-10-CM | POA: Diagnosis not present

## 2017-12-18 DIAGNOSIS — Z8249 Family history of ischemic heart disease and other diseases of the circulatory system: Secondary | ICD-10-CM | POA: Insufficient documentation

## 2017-12-18 DIAGNOSIS — Z955 Presence of coronary angioplasty implant and graft: Secondary | ICD-10-CM | POA: Insufficient documentation

## 2017-12-18 DIAGNOSIS — Z6830 Body mass index (BMI) 30.0-30.9, adult: Secondary | ICD-10-CM | POA: Insufficient documentation

## 2017-12-18 DIAGNOSIS — Z8042 Family history of malignant neoplasm of prostate: Secondary | ICD-10-CM | POA: Diagnosis not present

## 2017-12-18 DIAGNOSIS — G43909 Migraine, unspecified, not intractable, without status migrainosus: Secondary | ICD-10-CM | POA: Insufficient documentation

## 2017-12-18 DIAGNOSIS — Z794 Long term (current) use of insulin: Secondary | ICD-10-CM | POA: Diagnosis not present

## 2017-12-18 DIAGNOSIS — E669 Obesity, unspecified: Secondary | ICD-10-CM | POA: Diagnosis not present

## 2017-12-18 DIAGNOSIS — Z833 Family history of diabetes mellitus: Secondary | ICD-10-CM | POA: Insufficient documentation

## 2017-12-18 DIAGNOSIS — N184 Chronic kidney disease, stage 4 (severe): Secondary | ICD-10-CM | POA: Insufficient documentation

## 2017-12-18 DIAGNOSIS — E11319 Type 2 diabetes mellitus with unspecified diabetic retinopathy without macular edema: Secondary | ICD-10-CM | POA: Diagnosis not present

## 2017-12-18 DIAGNOSIS — Z90710 Acquired absence of both cervix and uterus: Secondary | ICD-10-CM | POA: Diagnosis not present

## 2017-12-18 DIAGNOSIS — I2510 Atherosclerotic heart disease of native coronary artery without angina pectoris: Secondary | ICD-10-CM | POA: Diagnosis not present

## 2017-12-18 DIAGNOSIS — E1122 Type 2 diabetes mellitus with diabetic chronic kidney disease: Secondary | ICD-10-CM | POA: Insufficient documentation

## 2017-12-18 DIAGNOSIS — E1142 Type 2 diabetes mellitus with diabetic polyneuropathy: Secondary | ICD-10-CM | POA: Insufficient documentation

## 2017-12-18 DIAGNOSIS — E785 Hyperlipidemia, unspecified: Secondary | ICD-10-CM | POA: Diagnosis not present

## 2017-12-18 DIAGNOSIS — R011 Cardiac murmur, unspecified: Secondary | ICD-10-CM | POA: Diagnosis not present

## 2017-12-18 DIAGNOSIS — Z8673 Personal history of transient ischemic attack (TIA), and cerebral infarction without residual deficits: Secondary | ICD-10-CM | POA: Diagnosis not present

## 2017-12-18 HISTORY — DX: Unspecified osteoarthritis, unspecified site: M19.90

## 2017-12-18 HISTORY — PX: AV FISTULA PLACEMENT: SHX1204

## 2017-12-18 LAB — GLUCOSE, CAPILLARY
Glucose-Capillary: 183 mg/dL — ABNORMAL HIGH (ref 70–99)
Glucose-Capillary: 156 mg/dL — ABNORMAL HIGH (ref 70–99)

## 2017-12-18 LAB — POCT I-STAT 4, (NA,K, GLUC, HGB,HCT)
Glucose, Bld: 162 mg/dL — ABNORMAL HIGH (ref 70–99)
HCT: 30 % — ABNORMAL LOW (ref 36.0–46.0)
Hemoglobin: 10.2 g/dL — ABNORMAL LOW (ref 12.0–15.0)
Potassium: 4.7 mmol/L (ref 3.5–5.1)
Sodium: 137 mmol/L (ref 135–145)

## 2017-12-18 SURGERY — ARTERIOVENOUS (AV) FISTULA CREATION
Anesthesia: Monitor Anesthesia Care | Laterality: Left

## 2017-12-18 MED ORDER — PROPOFOL 10 MG/ML IV BOLUS
INTRAVENOUS | Status: DC | PRN
  Administered 2017-12-18: 20 mg via INTRAVENOUS

## 2017-12-18 MED ORDER — LIDOCAINE HCL (PF) 1 % IJ SOLN
INTRAMUSCULAR | Status: AC
  Filled 2017-12-18: qty 30

## 2017-12-18 MED ORDER — LIDOCAINE 2% (20 MG/ML) 5 ML SYRINGE
INTRAMUSCULAR | Status: AC
  Filled 2017-12-18: qty 5

## 2017-12-18 MED ORDER — SODIUM CHLORIDE 0.9 % IV SOLN
INTRAVENOUS | Status: DC | PRN
  Administered 2017-12-18: 500 mL

## 2017-12-18 MED ORDER — PROPOFOL 500 MG/50ML IV EMUL
INTRAVENOUS | Status: DC | PRN
  Administered 2017-12-18: 50 ug/kg/min via INTRAVENOUS

## 2017-12-18 MED ORDER — HEPARIN SODIUM (PORCINE) 1000 UNIT/ML IJ SOLN
INTRAMUSCULAR | Status: DC | PRN
  Administered 2017-12-18: 3000 [IU] via INTRAVENOUS

## 2017-12-18 MED ORDER — FENTANYL CITRATE (PF) 100 MCG/2ML IJ SOLN
INTRAMUSCULAR | Status: DC | PRN
  Administered 2017-12-18 (×2): 50 ug via INTRAVENOUS

## 2017-12-18 MED ORDER — PROPOFOL 10 MG/ML IV BOLUS
INTRAVENOUS | Status: AC
  Filled 2017-12-18: qty 20

## 2017-12-18 MED ORDER — FENTANYL CITRATE (PF) 250 MCG/5ML IJ SOLN
INTRAMUSCULAR | Status: AC
  Filled 2017-12-18: qty 5

## 2017-12-18 MED ORDER — HYDROCODONE-ACETAMINOPHEN 5-325 MG PO TABS: 1.0000 | ORAL_TABLET | Freq: Four times a day (QID) | ORAL | 0 refills | Status: DC | PRN

## 2017-12-18 MED ORDER — SODIUM CHLORIDE 0.9 % IV SOLN
INTRAVENOUS | Status: DC
  Administered 2017-12-18: 08:00:00 via INTRAVENOUS

## 2017-12-18 MED ORDER — LIDOCAINE HCL 1 % IJ SOLN
INTRAMUSCULAR | Status: DC | PRN
  Administered 2017-12-18: 9 mL

## 2017-12-18 MED ORDER — PHENYLEPHRINE 40 MCG/ML (10ML) SYRINGE FOR IV PUSH (FOR BLOOD PRESSURE SUPPORT)
PREFILLED_SYRINGE | INTRAVENOUS | Status: AC
  Filled 2017-12-18: qty 10

## 2017-12-18 MED ORDER — SODIUM CHLORIDE 0.9 % IV SOLN
INTRAVENOUS | Status: AC
  Filled 2017-12-18: qty 1.2

## 2017-12-18 MED ORDER — CEFAZOLIN SODIUM-DEXTROSE 2-4 GM/100ML-% IV SOLN
2.0000 g | INTRAVENOUS | Status: AC
  Administered 2017-12-18: 2 g via INTRAVENOUS
  Filled 2017-12-18: qty 100

## 2017-12-18 MED ORDER — ROCURONIUM BROMIDE 50 MG/5ML IV SOSY
PREFILLED_SYRINGE | INTRAVENOUS | Status: AC
  Filled 2017-12-18: qty 5

## 2017-12-18 MED ORDER — 0.9 % SODIUM CHLORIDE (POUR BTL) OPTIME
TOPICAL | Status: DC | PRN
  Administered 2017-12-18: 1000 mL

## 2017-12-18 SURGICAL SUPPLY — 32 items
ARMBAND PINK RESTRICT EXTREMIT (MISCELLANEOUS) ×4 IMPLANT
CANISTER SUCT 3000ML PPV (MISCELLANEOUS) ×2 IMPLANT
CLIP VESOCCLUDE MED 6/CT (CLIP) ×2 IMPLANT
CLIP VESOCCLUDE SM WIDE 6/CT (CLIP) ×2 IMPLANT
COVER PROBE W GEL 5X96 (DRAPES) ×2 IMPLANT
COVER WAND RF STERILE (DRAPES) ×2 IMPLANT
DECANTER SPIKE VIAL GLASS SM (MISCELLANEOUS) IMPLANT
DERMABOND ADVANCED (GAUZE/BANDAGES/DRESSINGS) ×1
DERMABOND ADVANCED .7 DNX12 (GAUZE/BANDAGES/DRESSINGS) ×1 IMPLANT
ELECT REM PT RETURN 9FT ADLT (ELECTROSURGICAL) ×2
ELECTRODE REM PT RTRN 9FT ADLT (ELECTROSURGICAL) ×1 IMPLANT
GLOVE BIO SURGEON STRL SZ7.5 (GLOVE) ×2 IMPLANT
GLOVE BIOGEL PI IND STRL 8 (GLOVE) ×1 IMPLANT
GLOVE BIOGEL PI INDICATOR 8 (GLOVE) ×1
GOWN STRL REUS W/ TWL LRG LVL3 (GOWN DISPOSABLE) ×2 IMPLANT
GOWN STRL REUS W/ TWL XL LVL3 (GOWN DISPOSABLE) ×2 IMPLANT
GOWN STRL REUS W/TWL LRG LVL3 (GOWN DISPOSABLE) ×2
GOWN STRL REUS W/TWL XL LVL3 (GOWN DISPOSABLE) ×2
HEMOSTAT SPONGE AVITENE ULTRA (HEMOSTASIS) IMPLANT
KIT BASIN OR (CUSTOM PROCEDURE TRAY) ×2 IMPLANT
KIT TURNOVER KIT B (KITS) ×2 IMPLANT
NS IRRIG 1000ML POUR BTL (IV SOLUTION) ×2 IMPLANT
PACK CV ACCESS (CUSTOM PROCEDURE TRAY) ×2 IMPLANT
PAD ARMBOARD 7.5X6 YLW CONV (MISCELLANEOUS) ×4 IMPLANT
SUT MNCRL AB 4-0 PS2 18 (SUTURE) ×2 IMPLANT
SUT PROLENE 6 0 BV (SUTURE) ×2 IMPLANT
SUT PROLENE 7 0 BV 1 (SUTURE) IMPLANT
SUT VIC AB 3-0 SH 27 (SUTURE) ×1
SUT VIC AB 3-0 SH 27X BRD (SUTURE) ×1 IMPLANT
TOWEL GREEN STERILE (TOWEL DISPOSABLE) ×2 IMPLANT
UNDERPAD 30X30 (UNDERPADS AND DIAPERS) ×2 IMPLANT
WATER STERILE IRR 1000ML POUR (IV SOLUTION) IMPLANT

## 2017-12-18 NOTE — Anesthesia Preprocedure Evaluation (Signed)
Anesthesia Evaluation  Patient identified by MRN, date of birth, ID band Patient awake    Reviewed: Allergy & Precautions, NPO status , Patient's Chart, lab work & pertinent test results, reviewed documented beta blocker date and time   History of Anesthesia Complications Negative for: history of anesthetic complications  Airway Mallampati: III  TM Distance: >3 FB Neck ROM: Full  Mouth opening: Limited Mouth Opening  Dental  (+) Teeth Intact, Caps   Pulmonary neg pulmonary ROS,    breath sounds clear to auscultation       Cardiovascular hypertension, Pt. on medications and Pt. on home beta blockers (-) angina+ CAD and + Cardiac Stents   Rhythm:Regular     Neuro/Psych  Headaches, PSYCHIATRIC DISORDERS Depression TIA Neuromuscular disease    GI/Hepatic Neg liver ROS, GERD  Medicated and Controlled,  Endo/Other  diabetes, Type 2, Insulin Dependent  Renal/GU CRFRenal disease     Musculoskeletal  (+) Arthritis ,   Abdominal   Peds  Hematology  (+) anemia ,   Anesthesia Other Findings 2015 RCA stent, nl EF  Reproductive/Obstetrics                             Anesthesia Physical Anesthesia Plan  ASA: III  Anesthesia Plan: MAC   Post-op Pain Management:    Induction: Intravenous  PONV Risk Score and Plan: 2 and Treatment may vary due to age or medical condition and Propofol infusion  Airway Management Planned: Nasal Cannula  Additional Equipment: None  Intra-op Plan:   Post-operative Plan:   Informed Consent: I have reviewed the patients History and Physical, chart, labs and discussed the procedure including the risks, benefits and alternatives for the proposed anesthesia with the patient or authorized representative who has indicated his/her understanding and acceptance.   Dental advisory given  Plan Discussed with: CRNA and Surgeon  Anesthesia Plan Comments:          Anesthesia Quick Evaluation

## 2017-12-18 NOTE — Discharge Instructions (Signed)
° °  [facility] [place] ° °Discharge Instructions ° °AV Fistula or Graft Surgery for Dialysis Access ° °Please refer to the following instructions for your post-procedure care. Your surgeon or physician assistant will discuss any changes with you. ° °Activity ° °You may drive the day following your surgery, if you are comfortable and no longer taking prescription pain medication. Resume full activity as the soreness in your incision resolves. ° °Bathing/Showering ° °You may shower after you go home. Keep your incision dry for 48 hours. Do not soak in a bathtub, hot tub, or swim until the incision heals completely. You may not shower if you have a hemodialysis catheter. ° °Incision Care ° °Clean your incision with mild soap and water after 48 hours. Pat the area dry with a clean towel. You do not need a bandage unless otherwise instructed. Do not apply any ointments or creams to your incision. You may have skin glue on your incision. Do not peel it off. It will come off on its own in about one week. Your arm may swell a bit after surgery. To reduce swelling use pillows to elevate your arm so it is above your heart. Your doctor will tell you if you need to lightly wrap your arm with an ACE bandage. ° °Diet ° °Resume your normal diet. There are not special food restrictions following this procedure. In order to heal from your surgery, it is CRITICAL to get adequate nutrition. Your body requires vitamins, minerals, and protein. Vegetables are the best source of vitamins and minerals. Vegetables also provide the perfect balance of protein. Processed food has little nutritional value, so try to avoid this. ° °Medications ° °Resume taking all of your medications. If your incision is causing pain, you may take over-the counter pain relievers such as acetaminophen (Tylenol). If you were prescribed a stronger pain medication, please be aware these medications can cause nausea and constipation. Prevent  nausea by taking the medication with a snack or meal. Avoid constipation by drinking plenty of fluids and eating foods with high amount of fiber, such as fruits, vegetables, and grains. Do not take Tylenol if you are taking prescription pain medications. ° ° ° ° °Follow up °Your surgeon may want to see you in the office following your access surgery. If so, this will be arranged at the time of your surgery. ° °Please call us immediately for any of the following conditions: ° °Increased pain, redness, drainage (pus) from your incision site °Fever of 101 degrees or higher °Severe or worsening pain at your incision site °Hand pain or numbness. ° °Reduce your risk of vascular disease: ° °Stop smoking. If you would like help, call [facility] at 1-800-QUIT-NOW (1-800-784-8669) or [facility] at 336-586-4000 ° °Manage your cholesterol °Maintain a desired weight °Control your diabetes °Keep your blood pressure down ° °Dialysis ° °It will take several weeks to several months for your new dialysis access to be ready for use. Your surgeon will determine when it is OK to use it. Your nephrologist will continue to direct your dialysis. You can continue to use your Permcath until your new access is ready for use. ° °If you have any questions, please call the office at [phone]. ° °

## 2017-12-18 NOTE — Telephone Encounter (Signed)
-----   Message from Dagoberto Ligas, PA-C sent at 12/18/2017  9:54 AM EST -----  Can you schedule an appt with NP on Dr. Carlis Abbott office day in 4-6 weeks with fistula duplex.  PO L brachiocephalic fistula. Thanks, [facility]

## 2017-12-18 NOTE — H&P (Addendum)
History and Physical Interval Note:  12/18/2017 7:20 AM  [name]  has presented today for surgery, with the diagnosis of chronic kidney disease  The various methods of treatment have been discussed with the patient and family. After consideration of risks, benefits and other options for treatment, the patient has consented to  Procedure(s): ARTERIOVENOUS (AV) FISTULA CREATION ARM (Left) as a surgical intervention .  The patient's history has been reviewed, patient examined, no change in status, stable for surgery.  I have reviewed the patient's chart and labs.  Questions were answered to the patient's satisfaction.    Left upper extremity AV fistula.  Marty Heck  Patient name: [name]  MRN: [id number]        DOB: 10-11-1940          Sex: female  REASON FOR CONSULT: New access evaluation  HPI: [name] is a 77 y.o. female, history of diabetes and hypertension and CKD stage IV that presents for new AV fistula access evaluation.  Patient states she has stage IV chronic kidney disease and has been advised that she needs permanent access.  She denies any previous upper extremity AV fistula or graft.  She denies any previous catheters to her upper extremities.  She denies pacemaker.  She states she is right-hand dominant.  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  She sounds very functional otherwise.  No blood thinner other than aspirin.      Past Medical History:  Diagnosis Date  . Anginal pain ([place])   . Cerebrovascular disease   . CKD (chronic kidney disease)   . Coronary artery disease   . Depression   . Diabetes ([place])    INSULIN DEPENDENT  . Diabetic peripheral neuropathy ([place])   . Diabetic retinopathy ([place])   . Diverticulitis   . Dyslipidemia   . GERD (gastroesophageal reflux disease)   . Headache(784.0) 01/30/2013  . Heart murmur   . History of cerebrovascular accident 01/30/2013  . Hyperlipidemia   . Hypertension   . Migraine   . Obesity    . TIA (transient ischemic attack)    " MULTIPLE PER PATIENT "         Past Surgical History:  Procedure Laterality Date  . ABDOMINAL HYSTERECTOMY    . bilateral cataract surgery    . CARDIAC CATHETERIZATION  01/07/2014   DR Einar Gip  . LEFT HEART CATHETERIZATION WITH CORONARY ANGIOGRAM N/A 01/07/2014   Procedure: LEFT HEART CATHETERIZATION WITH CORONARY ANGIOGRAM;  Surgeon: Laverda Page, MD;  Location: [facility] CATH LAB;  Service: Cardiovascular;  Laterality: N/A;  . middle cerebral artery stent placement Right   . OTHER SURGICAL HISTORY     laser surgery  . PTCA  01/07/2014   DES to RCA    DR Einar Gip  . right knee surgery     for infection         Family History  Problem Relation Age of Onset  . Diabetes Mother   . Heart disease Mother   . Prostate cancer Father   . Hypertension Brother   . Prostate cancer Brother     SOCIAL HISTORY: Social History        Socioeconomic History  . Marital status: Widowed    Spouse name: Not on file  . Number of children: 4  . Years of education: PHD  . Highest education level: Not on file  Occupational History  . Not on file  Social Needs  . Emergency planning/management officer  strain: Not on file  . Food insecurity:    Worry: Not on file    Inability: Not on file  . Transportation needs:    Medical: Not on file    Non-medical: Not on file  Tobacco Use  . Smoking status: Never Smoker  . Smokeless tobacco: Never Used  Substance and Sexual Activity  . Alcohol use: No  . Drug use: No  . Sexual activity: Not on file  Lifestyle  . Physical activity:    Days per week: Not on file    Minutes per session: Not on file  . Stress: Not on file  Relationships  . Social connections:    Talks on phone: Not on file    Gets together: Not on file    Attends religious service: Not on file    Active member of club or organization: Not on file    Attends meetings of clubs or organizations: Not on file     Relationship status: Not on file  . Intimate partner violence:    Fear of current or ex partner: Not on file    Emotionally abused: Not on file    Physically abused: Not on file    Forced sexual activity: Not on file  Other Topics Concern  . Not on file  Social History Narrative   Patient is widowed and lives with her two adopted children.   Patient is retired.   Patient has two children of her own and two adopted children.   Patient has a PHD.   Patient is right-handed.    No Known Allergies        Current Outpatient Medications  Medication Sig Dispense Refill  . aspirin 81 MG tablet Take 1 tablet (81 mg total) by mouth daily. 30 tablet   . B Complex Vitamins (VITAMIN B COMPLEX) TABS Take 1 tablet by mouth daily.    . B-D INS SYR ULTRAFINE 1CC/30G 30G X 1/2" 1 ML MISC Inject 1 each into the skin 3 (three) times daily between meals.    . clopidogrel (PLAVIX) 75 MG tablet Take 1 tablet (75 mg total) by mouth daily with breakfast. 30 tablet 6  . colchicine 0.6 MG tablet Take 0.6 mg by mouth daily.    . CRESTOR 10 MG tablet Take 10 mg by mouth daily.    . famotidine (PEPCID) 40 MG tablet Take 1 tablet (40 mg total) by mouth 2 (two) times daily. 30 tablet 6  . folic acid (FOLVITE) 1 MG tablet Take 1 mg by mouth daily.    . insulin aspart (NOVOLOG) 100 UNIT/ML injection Inject into the skin 3 (three) times daily before meals.    . insulin NPH-regular Human (NOVOLIN 70/30) (70-30) 100 UNIT/ML injection 20 units subcutaneously in the morning, 40 units subcutaneously in the evening 10 mL   . isosorbide mononitrate (IMDUR) 60 MG 24 hr tablet Take 60 mg by mouth daily.  1  . metoprolol succinate (TOPROL-XL) 50 MG 24 hr tablet Take 50 mg by mouth 2 (two) times daily.     Marland Kitchen NIASPAN 500 MG CR tablet Take 500 mg by mouth daily.    Marland Kitchen NITROSTAT 0.4 MG SL tablet Place 0.4 mg under the tongue every 5 (five) minutes as needed for chest pain.   1  .  nortriptyline (PAMELOR) 10 MG capsule Take 10 mg by mouth daily.    . ONE TOUCH ULTRA TEST test strip 1 each by Other route 3 (three) times daily between meals.    Marland Kitchen  PRISTIQ 100 MG 24 hr tablet Take 100 mg by mouth every morning.     . torsemide (DEMADEX) 20 MG tablet Take 20 mg by mouth daily.    . febuxostat (ULORIC) 40 MG tablet Take 40 mg by mouth daily.    Marland Kitchen KAZANO 12.05-998 MG TABS Take 12.5-1,000 tablets by mouth 2 (two) times daily.    . verapamil (CALAN-SR) 120 MG CR tablet Take 120 mg by mouth daily.     No current facility-administered medications for this visit.     REVIEW OF SYSTEMS:  [X]  denotes positive finding, [ ]  denotes negative finding Cardiac  Comments:  Chest pain or chest pressure:    Shortness of breath upon exertion:    Short of breath when lying flat:    Irregular heart rhythm:        Vascular    Pain in calf, thigh, or hip brought on by ambulation:    Pain in feet at night that wakes you up from your sleep:     Blood clot in your veins:    Leg swelling:         Pulmonary    Oxygen at home:    Productive cough:     Wheezing:         Neurologic    Sudden weakness in arms or legs:     Sudden numbness in arms or legs:     Sudden onset of difficulty speaking or slurred speech:    Temporary loss of vision in one eye:     Problems with dizziness:         Gastrointestinal    Blood in stool:     Vomited blood:         Genitourinary    Burning when urinating:     Blood in urine:        Psychiatric    Major depression:         Hematologic    Bleeding problems:    Problems with blood clotting too easily:        Skin    Rashes or ulcers:        Constitutional    Fever or chills:      PHYSICAL EXAM:    Vitals:   12/05/17 1220  BP: (!) 96/50  Pulse: (!) 52  Resp: 18  Temp: (!) 97 F (36.1 C)  TempSrc: Oral  SpO2: 99%   Weight: 77 kg  Height: 5\' 3"  (1.6 m)    GENERAL: The patient is a well-nourished female, in no acute distress. The vital signs are documented above. CARDIAC: There is a regular rate and rhythm.  VASCULAR:  2+ radial pulse palpable bilateral upper extremities 2+ brachial pulse palpable bilateral upper extremities Motor and sensory intact to bilateral hands PULMONARY: There is good air exchange bilaterally without wheezing or rales. ABDOMEN: Soft and non-tender with normal pitched bowel sounds.  MUSCULOSKELETAL: There are no major deformities or cyanosis. NEUROLOGIC: No focal weakness or paresthesias are detected. SKIN: There are no ulcers or rashes noted.   DATA:   I independently reviewed her vein mapping which shows triphasic patent arteries to the bilateral upper extremities.  Unfortunately her radial artery on the left is small at 1.9 mm and her brachial artery is more robust at 4.4 mm.  Assessment/Plan:  77 year old female with stage IV chronic kidney disease likely from diabetes and hypertension that presents for new AV fistula evaluation.  Patient is right-hand dominant so I have recommended a  left upper extremity access procedure.  Given review of her vein mapping her radial artery at the wrist is very small at only 1.9 mm and so I think she would be best served with a left brachiocephalic fistula.  I discussed with the patient risk and benefits including bleeding, infection, steal,  need for additional procedure in the future, and access failure.  We will schedule for next available date.   Marty Heck, MD [facility] [place] Office: [phone] Pager: [place]

## 2017-12-18 NOTE — Op Note (Signed)
OPERATIVE NOTE   PROCEDURE: Left brachiocephalic arteriovenous fistula placement  PRE-OPERATIVE DIAGNOSIS: chronic kidney disease stage IV  POST-OPERATIVE DIAGNOSIS: same as above   SURGEON: Marty Heck, MD  ASSISTANT(S): Arlee Muslim, PA  ANESTHESIA: MAC  ESTIMATED BLOOD LOSS: Minimal  FINDING(S): 1.  Cephalic vein: 4.5 mm, acceptable 2.  Brachial artery: 3.5 mm, atherosclerotic disease evident 3.  Venous outflow: palpable thrill  4.  Radial flow: dopplerable radial signal  SPECIMEN(S):  none  INDICATIONS:   [name] is a 77 y.o. female who presents with stage IV CKD for permanent dialysis access.  The patient is scheduled for left brachiocephalic arteriovenous fistula placement.  The patient is aware the risks include but are not limited to: bleeding, infection, steal syndrome, nerve damage, ischemic monomelic neuropathy, failure to mature, and need for additional procedures.  The patient is aware of the risks of the procedure and elects to proceed forward.   DESCRIPTION: After full informed written consent was obtained from the patient, the patient was brought back to the operating room and placed supine upon the operating table.  Prior to induction, the patient received IV antibiotics.   After obtaining adequate anesthesia, the patient was then prepped and draped in the standard fashion for a left arm access procedure.  I turned my attention first to identifying the patient's cephalic vein and brachial artery.  Using SonoSite guidance, the location of these vessels were marked out on the skin.     At this point, I injected local anesthetic to obtain a field block of the antecubitum.  In total, I injected about 10 mL of 1% lidocaine without epinephrine.  I made a transverse incision at the level of the antecubitum and dissected through the subcutaneous tissue and fascia to gain exposure of the brachial artery.  This was noted to be 3.5 mm in diameter externally.   This was dissected out proximally and distally and controlled with vessel loops .  I then dissected out the cephalic vein.  This was noted to be 4.5 mm in diameter externally.  The distal segment of the vein was ligated with a  2-0 silk, and the vein was transected.  The proximal segment was interrogated with serial dilators.  The vein accepted up to a 4 mm dilator without any difficulty.  I then instilled the heparinized saline into the vein and clamped it.  At this point, I reset my exposure of the brachial artery and placed the artery under tension proximally and distally.  I made an arteriotomy with a #11 blade, and then I extended the arteriotomy with a Potts scissor.  I injected heparinized saline proximal and distal to this arteriotomy.  The vein was then sewn to the artery in an end-to-side configuration with a running stitch of 6-0 Prolene.  Prior to completing this anastomosis, I allowed the vein and artery to backbleed.  There was no evidence of clot from any vessels.  I completed the anastomosis in the usual fashion and then released all vessel loops and clamps.    There was a palpable thrill in the venous outflow, and there was a dopplerable radial signal.  At this point, I irrigated out the surgical wound.  There was no further active bleeding.  The subcutaneous tissue was reapproximated with a running stitch of 3-0 Vicryl.  The skin was then reapproximated with a running subcuticular stitch of 4-0 Vicryl.  The skin was then cleaned, dried, and reinforced with Dermabond.  The patient tolerated  this procedure well.   COMPLICATIONS: None  CONDITION: Stable  Marty Heck, MD [facility] Office: [phone] Pager: [phone]  12/18/2017, 9:45 AM

## 2017-12-18 NOTE — Telephone Encounter (Signed)
sch appt lvm mld ltr 01/30/2018 1pm Dialysis Duplex 215pm p/o NP

## 2017-12-18 NOTE — Transfer of Care (Signed)
Immediate Anesthesia Transfer of Care Note  Patient: [name]  Procedure(s) Performed: ARTERIOVENOUS (AV) FISTULA CREATION ARM (Left )  Patient Location: PACU  Anesthesia Type:MAC  Level of Consciousness: awake, alert  and oriented  Airway & Oxygen Therapy: Patient Spontanous Breathing  Post-op Assessment: Report given to RN and Post -op Vital signs reviewed and stable  Post vital signs: Reviewed and stable  Last Vitals:  Vitals Value Taken Time  BP 146/50 12/18/2017 10:01 AM  Temp    Pulse 56 12/18/2017 10:06 AM  Resp 19 12/18/2017 10:06 AM  SpO2 97 % 12/18/2017 10:06 AM  Vitals shown include unvalidated device data.  Last Pain:  Vitals:   12/18/17 0737  PainSc: 0-No pain      Patients Stated Pain Goal: 3 (70/35/00 9381)  Complications: No apparent anesthesia complications

## 2017-12-19 ENCOUNTER — Encounter (HOSPITAL_COMMUNITY): Payer: Self-pay | Admitting: Vascular Surgery

## 2017-12-25 ENCOUNTER — Encounter (HOSPITAL_COMMUNITY): Payer: Self-pay | Admitting: Vascular Surgery

## 2017-12-25 NOTE — Anesthesia Postprocedure Evaluation (Signed)
Anesthesia Post Note  Patient: [name]  Procedure(s) Performed: ARTERIOVENOUS (AV) FISTULA CREATION ARM (Left )     Patient location during evaluation: PACU Anesthesia Type: MAC Level of consciousness: awake and alert Pain management: pain level controlled Vital Signs Assessment: post-procedure vital signs reviewed and stable Respiratory status: spontaneous breathing, nonlabored ventilation, respiratory function stable and patient connected to nasal cannula oxygen Cardiovascular status: stable and blood pressure returned to baseline Postop Assessment: no apparent nausea or vomiting Anesthetic complications: no    Last Vitals:  Vitals:   12/18/17 1015 12/18/17 1045  BP: (!) 161/53 (!) 164/52  Pulse: (!) 56 (!) 58  Resp: 11 11  Temp:  (!) 36.1 C  SpO2: 94% 99%    Last Pain:  Vitals:   12/18/17 1045  PainSc: 0-No pain                 Yaritzy Huser

## 2018-01-18 ENCOUNTER — Other Ambulatory Visit: Payer: Self-pay

## 2018-01-18 DIAGNOSIS — N184 Chronic kidney disease, stage 4 (severe): Secondary | ICD-10-CM

## 2018-01-30 ENCOUNTER — Encounter: Payer: Medicare Other | Admitting: Family

## 2018-01-30 ENCOUNTER — Encounter (HOSPITAL_COMMUNITY): Payer: Medicare Other

## 2018-02-01 ENCOUNTER — Other Ambulatory Visit (HOSPITAL_COMMUNITY)
Admission: RE | Admit: 2018-02-01 | Discharge: 2018-02-01 | Disposition: A | Discharge status: Home / Self Care | Payer: Medicare Other | Source: Ambulatory Visit | Attending: Internal Medicine | Admitting: Internal Medicine

## 2018-02-01 DIAGNOSIS — E876 Hypokalemia: Secondary | ICD-10-CM | POA: Insufficient documentation

## 2018-02-01 LAB — BASIC METABOLIC PANEL
Anion gap: 10 (ref 5–15)
BUN: 39 mg/dL — ABNORMAL HIGH (ref 8–23)
CO2: 27 mmol/L (ref 22–32)
Calcium: 9.9 mg/dL (ref 8.9–10.3)
Chloride: 101 mmol/L (ref 98–111)
Creatinine, Ser: 3.28 mg/dL — ABNORMAL HIGH (ref 0.44–1.00)
GFR calc Af Amer: 15 mL/min — ABNORMAL LOW (ref >60)
GFR calc non Af Amer: 13 mL/min — ABNORMAL LOW (ref >60)
Glucose, Bld: 131 mg/dL — ABNORMAL HIGH (ref 70–99)
Potassium: 5.1 mmol/L (ref 3.5–5.1)
Sodium: 138 mmol/L (ref 135–145)

## 2018-02-28 NOTE — Progress Notes (Signed)
POST OPERATIVE OFFICE NOTE    CC:  F/u for surgery  HPI:  [name] with CKD IV who is s/p left BC AVF on 12/18/17 by Dr. Carlis Abbott.  She returns today for duplex and follow up.  She is not yet on dialysis.    She states that she has some numbness in her all her fingertips.  She does not have any pain in her hand and she does not have any issues using her hand.    No Known Allergies  Current Outpatient Medications  Medication Sig Dispense Refill  . allopurinol (ZYLOPRIM) 100 MG tablet Take 50 mg by mouth daily.    Marland Kitchen amLODipine (NORVASC) 10 MG tablet Take 10 mg by mouth daily.    Marland Kitchen aspirin 81 MG tablet Take 1 tablet (81 mg total) by mouth daily. 30 tablet   . B-D INS SYR ULTRAFINE 1CC/30G 30G X 1/2" 1 ML MISC Inject 1 each into the skin 3 (three) times daily between meals.    . calcitRIOL (ROCALTROL) 0.25 MCG capsule Take 0.5 mcg by mouth daily.    . calcium carbonate (OSCAL) 1500 (600 Ca) MG TABS tablet Take 600 mg by mouth daily.    . cloNIDine (CATAPRES) 0.1 MG tablet Take 0.1 mg by mouth 2 (two) times daily.    . clopidogrel (PLAVIX) 75 MG tablet Take 1 tablet (75 mg total) by mouth daily with breakfast. (Patient not taking: Reported on 12/08/2017) 30 tablet 6  . famotidine (PEPCID) 40 MG tablet Take 1 tablet (40 mg total) by mouth 2 (two) times daily. (Patient not taking: Reported on 12/08/2017) 30 tablet 6  . Ferrous Sulfate (IRON) 142 (45 Fe) MG TBCR Take 142 mg by mouth 2 (two) times daily.    . folic acid (FOLVITE) 1 MG tablet Take 1 mg by mouth daily.    Marland Kitchen HYDROcodone-acetaminophen (NORCO) 5-325 MG tablet Take 1 tablet by mouth every 6 (six) hours as needed for moderate pain. 10 tablet 0  . insulin glargine (LANTUS) 100 UNIT/ML injection Inject 13-15 Units into the skin at bedtime.     . insulin NPH-regular Human (NOVOLIN 70/30) (70-30) 100 UNIT/ML injection 20 units subcutaneously in the morning, 40 units subcutaneously in the evening (Patient not taking: Reported on  12/08/2017) 10 mL   . insulin regular (NOVOLIN R,HUMULIN R) 100 units/mL injection Inject 5-10 Units into the skin 3 (three) times daily before meals.    Marland Kitchen labetalol (NORMODYNE) 300 MG tablet Take 300 mg by mouth 2 (two) times daily.    Marland Kitchen leflunomide (ARAVA) 10 MG tablet Take 10 mg by mouth daily.    Marland Kitchen lisinopril (PRINIVIL,ZESTRIL) 5 MG tablet Take 5 mg by mouth daily.    . Magnesium 250 MG TABS Take 250 mg by mouth daily.    . niacin 500 MG CR capsule Take 500 mg by mouth daily.    Marland Kitchen NITROSTAT 0.4 MG SL tablet Place 0.4 mg under the tongue every 5 (five) minutes as needed for chest pain.   1  . nortriptyline (PAMELOR) 10 MG capsule Take 10 mg by mouth daily.    Marland Kitchen omeprazole (PRILOSEC) 40 MG capsule Take 40 mg by mouth daily.    . ONE TOUCH ULTRA TEST test strip 1 each by Other route 3 (three) times daily between meals.    Marland Kitchen PRISTIQ 100 MG 24 hr tablet Take 100 mg by mouth every morning.     . rosuvastatin (CRESTOR) 20 MG tablet Take 20  mg by mouth daily.    Marland Kitchen torsemide (DEMADEX) 20 MG tablet Take 20 mg by mouth daily.     No current facility-administered medications for this visit.      ROS:  See HPI  Physical Exam:  Today's Vitals   03/01/18 1556  BP: (!) 128/50  Pulse: (!) 55  Resp: 20  Temp: (!) 97.3 F (36.3 C)  SpO2: 97%  Weight: 169 lb (76.7 kg)  Height: 5\' 3"  (1.6 m)   Body mass index is 29.94 kg/m.   Incision:  Healed nicely Extremities:  +thrill/bruit within the fistula; able to palpate fistula; there is an easily palpable left radial pulse.  Motor and sensation are in tact.    Dialysis duplex 03/01/2018: Diameter:  0.45cm-0.63cm Depth:  0.42-1.68 Competing branch mid upper arm  Assessment/Plan:  [name] who is s/p: left BC AVF on 12/18/17 by Dr. Carlis Abbott.     -fistula slow to [facility] is not yet on dialysis & unsure of if and when she will need it.  Will bring her back in 6 weeks and repeat duplex.  Hopefully it will mature between now and  then.  If it does not, there is a competing branch that may need ligation.  She is not yet on dialysis so would be hesitant to do fistulogram at this point.  Fistula is also deep by duplex although palpable-may need superficialization at some point if it does mature.   -she does have some numbness of her Musolf tips on the left hand.  She has an easily palpable left radial pulse.  She does not have any pain or motor loss.  Feel this is probably due to nerve irritation, but discussed with pt should she develop pain in her hand or unable to use her hand, she needs to contact us immediately and she expressed understanding.  Will re-evaluate when she returns in 6 weeks.   Leontine Locket, PA-C [facility] [phone]  Clinic MD:  Oneida Alar

## 2018-03-01 ENCOUNTER — Ambulatory Visit (HOSPITAL_COMMUNITY)
Admission: RE | Admit: 2018-03-01 | Discharge: 2018-03-01 | Disposition: A | Discharge status: Home / Self Care | Payer: Medicare Other | Source: Ambulatory Visit | Attending: Vascular Surgery | Admitting: Vascular Surgery

## 2018-03-01 ENCOUNTER — Other Ambulatory Visit: Payer: Self-pay

## 2018-03-01 ENCOUNTER — Encounter: Payer: Self-pay | Admitting: Physician Assistant

## 2018-03-01 ENCOUNTER — Ambulatory Visit (INDEPENDENT_AMBULATORY_CARE_PROVIDER_SITE_OTHER): Payer: Medicare Other | Admitting: Physician Assistant

## 2018-03-01 VITALS — BP 128/50 | HR 55 | Temp 97.3°F | Resp 20 | Ht 63.0 in | Wt 169.0 lb

## 2018-03-01 DIAGNOSIS — N184 Chronic kidney disease, stage 4 (severe): Secondary | ICD-10-CM | POA: Diagnosis present

## 2018-04-18 ENCOUNTER — Telehealth (HOSPITAL_COMMUNITY): Payer: Self-pay | Admitting: Rehabilitation

## 2018-04-18 ENCOUNTER — Other Ambulatory Visit: Payer: Self-pay

## 2018-04-18 DIAGNOSIS — N184 Chronic kidney disease, stage 4 (severe): Secondary | ICD-10-CM

## 2018-04-18 NOTE — Telephone Encounter (Signed)
The above patient or their representative was contacted and gave the following answers to these questions:         Do you have any of the following symptoms? No  Fever                    Cough                   Shortness of breath  Do  you have any of the following other symptoms? No   muscle pain         vomiting,        diarrhea        rash         weakness        red eye        abdominal pain         bruising          bruising or bleeding              joint pain           severe headache    Have you been in contact with someone who was or has been sick in the past 2 weeks? No  Yes                 Unsure                         Unable to assess   Does the person that you were in contact with have any of the following symptoms? No  Cough         shortness of breath           muscle pain         vomiting,            diarrhea            rash            weakness           fever            red eye           abdominal pain           bruising  or  bleeding                joint pain                severe headache               Have you  or someone you have been in contact with traveled internationally in th last month?  No       If yes, which countries?   Have you  or someone you have been in contact with traveled outside [place] in th last month?  No       If yes, which state and city?   COMMENTS OR ACTION PLAN FOR THIS PATIENT:          

## 2018-04-19 ENCOUNTER — Ambulatory Visit (INDEPENDENT_AMBULATORY_CARE_PROVIDER_SITE_OTHER): Payer: Medicare Other | Admitting: Family

## 2018-04-19 ENCOUNTER — Other Ambulatory Visit: Payer: Self-pay

## 2018-04-19 ENCOUNTER — Ambulatory Visit (HOSPITAL_COMMUNITY)
Admission: RE | Admit: 2018-04-19 | Discharge: 2018-04-19 | Disposition: A | Discharge status: Home / Self Care | Payer: Medicare Other | Source: Ambulatory Visit | Attending: Family | Admitting: Family

## 2018-04-19 ENCOUNTER — Encounter: Payer: Self-pay | Admitting: Family

## 2018-04-19 VITALS — BP 159/54 | HR 54 | Temp 97.9°F | Resp 16 | Ht 63.0 in | Wt 168.0 lb

## 2018-04-19 DIAGNOSIS — N184 Chronic kidney disease, stage 4 (severe): Secondary | ICD-10-CM

## 2018-04-19 DIAGNOSIS — I770 Arteriovenous fistula, acquired: Secondary | ICD-10-CM

## 2018-04-19 NOTE — Progress Notes (Signed)
CC: Follow up AVF creation with duplex and exam, stage 4-5 CKD   History of Present Illness  [name] is a 78 y.o. (1940/08/24) female who is s/p left brachiocephalic arteriovenous fistula placement on 12-18-17 by Dr. Carlis Abbott for stage 4-5 CKD.  She has not yet started hemodialysis, and states that her GFR has been 13-16 most recently. She hopes not to start hemodialysis any time soon, states Dr. Jimmy Footman has not indicated that she needs to start any time soon.   She reports moderate numbness in her left hand fingertips.   She has never used tobacco.  Her systolic blood pressure is slightly elevated at 222, diastolic at 54.  She has DM, no recent A1C result available. Glucose range from 2 mos ago to 8 years ago is 113-224.    Past Medical History:  Diagnosis Date  . Anginal pain ([place])   . Arthritis   . Cerebrovascular disease   . CKD (chronic kidney disease)    Sees Dr Deterding  . Coronary artery disease   . Depression   . Diabetes ([place])    INSULIN DEPENDENT  . Diabetic peripheral neuropathy ([place])   . Diabetic retinopathy ([place])   . Diverticulitis   . Dyslipidemia   . GERD (gastroesophageal reflux disease)   . Headache(784.0) 01/30/2013  . Heart murmur   . History of cerebrovascular accident 01/30/2013  . Hyperlipidemia   . Hypertension   . Migraine   . Obesity   . TIA (transient ischemic attack)    " MULTIPLE PER PATIENT "; had left sided weakness- rehab and wants without assistance    Social History Social History   Tobacco Use  . Smoking status: Never Smoker  . Smokeless tobacco: Never Used  Substance Use Topics  . Alcohol use: No  . Drug use: No    Family History Family History  Problem Relation Age of Onset  . Diabetes Mother   . Heart disease Mother   . Prostate cancer Father   . Hypertension Brother   . Prostate cancer Brother     Surgical History Past Surgical History:  Procedure Laterality Date  . ABDOMINAL HYSTERECTOMY    . AV  FISTULA PLACEMENT Left 12/18/2017   Procedure: ARTERIOVENOUS (AV) FISTULA CREATION ARM;  Surgeon: Marty Heck, MD;  Location: [facility];  Service: Vascular;  Laterality: Left;  . bilateral cataract surgery    . CARDIAC CATHETERIZATION  01/07/2014   DR Conception RESECTION  02/1999  . COLONOSCOPY    . COLOSTOMY  02/1999  . COLOSTOMY CLOSURE  07/1999  . LEFT HEART CATHETERIZATION WITH CORONARY ANGIOGRAM N/A 01/07/2014   Procedure: LEFT HEART CATHETERIZATION WITH CORONARY ANGIOGRAM;  Surgeon: Laverda Page, MD;  Location[facility] C[facility] LAB;  Service: Cardiovascular;  Laterality: N/A;  . middle cerebral artery stent placement Right   . OTHER SURGICAL HISTORY     laser surgery  . PTCA  01/07/2014   DES to RCA    DR Einar Gip  . right knee surgery     for infection    No Known Allergies  Current Outpatient Medications  Medication Sig Dispense Refill  . allopurinol (ZYLOPRIM) 100 MG tablet Take 50 mg by mouth daily.    Marland Kitchen amLODipine (NORVASC) 10 MG tablet Take 10 mg by mouth daily.    Marland Kitchen aspirin 81 MG tablet Take 1 tablet (81 mg total) by mouth daily. 30 tablet   . B-D INS SYR ULTRAFINE 1CC/30G 30G X 1/2" 1  ML MISC Inject 1 each into the skin 3 (three) times daily between meals.    . calcitRIOL (ROCALTROL) 0.25 MCG capsule Take 0.5 mcg by mouth daily.    . calcium carbonate (OSCAL) 1500 (600 Ca) MG TABS tablet Take 600 mg by mouth daily.    . cloNIDine (CATAPRES) 0.1 MG tablet Take 0.1 mg by mouth 2 (two) times daily.    . famotidine (PEPCID) 40 MG tablet Take 1 tablet (40 mg total) by mouth 2 (two) times daily. 30 tablet 6  . Ferrous Sulfate (IRON) 142 (45 Fe) MG TBCR Take 142 mg by mouth 2 (two) times daily.    . folic acid (FOLVITE) 1 MG tablet Take 1 mg by mouth daily.    . insulin glargine (LANTUS) 100 UNIT/ML injection Inject 13-15 Units into the skin at bedtime.     . insulin lispro (HUMALOG) 100 UNIT/ML injection Inject 4 Units into the skin 3 (three) times daily before meals.     . insulin regular (NOVOLIN R,HUMULIN R) 100 units/mL injection Inject 5-10 Units into the skin 3 (three) times daily before meals.    Marland Kitchen labetalol (NORMODYNE) 300 MG tablet Take 300 mg by mouth 2 (two) times daily.    Marland Kitchen leflunomide (ARAVA) 10 MG tablet Take 10 mg by mouth daily.    Marland Kitchen lisinopril (PRINIVIL,ZESTRIL) 5 MG tablet Take 5 mg by mouth daily.    . Magnesium 250 MG TABS Take 250 mg by mouth daily.    . niacin (NIASPAN) 500 MG CR tablet     . niacin 500 MG CR capsule Take 500 mg by mouth daily.    Marland Kitchen NITROSTAT 0.4 MG SL tablet Place 0.4 mg under the tongue every 5 (five) minutes as needed for chest pain.   1  . nortriptyline (PAMELOR) 10 MG capsule Take 10 mg by mouth daily.    Marland Kitchen omeprazole (PRILOSEC) 40 MG capsule Take 40 mg by mouth daily.    . ONE TOUCH ULTRA TEST test strip 1 each by Other route 3 (three) times daily between meals.    Marland Kitchen PRISTIQ 100 MG 24 hr tablet Take 100 mg by mouth every morning.     . rosuvastatin (CRESTOR) 20 MG tablet Take 20 mg by mouth daily.    Marland Kitchen torsemide (DEMADEX) 20 MG tablet Take 20 mg by mouth daily.    . insulin NPH-regular Human (NOVOLIN 70/30) (70-30) 100 UNIT/ML injection 20 units subcutaneously in the morning, 40 units subcutaneously in the evening (Patient not taking: Reported on 04/19/2018) 10 mL    No current facility-administered medications for this visit.      REVIEW OF SYSTEMS: see HPI for pertinent positives and negatives    PHYSICAL EXAMINATION:  Vitals:   04/19/18 0921  BP: (!) 159/54  Pulse: (!) 54  Resp: 16  Temp: 97.9 F (36.6 C)  TempSrc: Oral  SpO2: 100%  Weight: 168 lb (76.2 kg)  Height: 5\' 3"  (1.6 m)   Body mass index is 29.76 kg/m.  General: The patient appears her stated age, in NAD, appears comfortable, is smiling.    HEENT:  No gross abnormalities Pulmonary: Respirations are non-labored Abdomen: Soft and non-tender  Musculoskeletal: There are no major deformities.   Neurologic: No focal weakness or  paresthesias are detected, hand grip is 5/5 bilaterally.  Skin: There are no ulcer or rashes noted. Psychiatric: The patient has normal affect. Cardiovascular: There is a regular rate and rhythm without significant murmur appreciated.     Non-Invasive  Vascular Imaging  Left arm Access Duplex  (Date: 04/19/2018):  Findings: +--------------------+----------+-----------------+--------+ AVF                 PSV (cm/s)Flow Vol (mL/min)Comments +--------------------+----------+-----------------+--------+ Native artery inflow   234           455                +--------------------+----------+-----------------+--------+ AVF Anastomosis        487                              +--------------------+----------+-----------------+--------+   +------------+----------+-------------+----------+---------------------------+ OUTFLOW VEINPSV (cm/s)Diameter (cm)Depth (cm)         Describe           +------------+----------+-------------+----------+---------------------------+ Prox UA        136        0.56        1.53   branches 0.276 and 0.239 cm +------------+----------+-------------+----------+---------------------------+ Mid UA         156        0.71        0.97         Retained valve        +------------+----------+-------------+----------+---------------------------+ Dist UA        162        0.74        0.75                               +------------+----------+-------------+----------+---------------------------+ AC Fossa       813        0.27        0.87                               +------------+----------+-------------+----------+---------------------------+   Summary: Narrow segment of outflow vein at the ACF measuring approximately 1.95cm with elevated velocities. Non hemodynamically significant elevation of velocity in the distal outflow vein due to retained valve.     Medical Decision Making  [name] is a 78 y.o. female who is s/p  left brachiocephalic arteriovenous fistula placement on 12-18-17 by Dr. Carlis Abbott for stage 4-5 CKD.  She has not yet started hemodialysis, and states that her GFR has been 13-16 most recently. She hopes not to start hemodialysis any time soon, states Dr. Jimmy Footman has not indicated that she needs to start any time soon.    AVF duplex today indicates that she will need superficialization of her AVF at some point. I discussed this with Dr. Oneida Alar who agrees.     Clemon Chambers, RN, MSN, FNP-C [facility] Office: [phone]  04/19/2018, 6:59 PM  Clinic SE:[id number]

## 2018-04-24 ENCOUNTER — Other Ambulatory Visit: Payer: Self-pay | Admitting: Cardiology

## 2018-06-20 ENCOUNTER — Other Ambulatory Visit: Payer: Self-pay | Admitting: *Deleted

## 2018-06-25 ENCOUNTER — Other Ambulatory Visit: Payer: Self-pay | Admitting: Cardiology

## 2018-06-26 ENCOUNTER — Other Ambulatory Visit (HOSPITAL_COMMUNITY)
Admission: RE | Admit: 2018-06-26 | Discharge: 2018-06-26 | Disposition: A | Discharge status: Home / Self Care | Payer: Medicare Other | Source: Ambulatory Visit | Attending: Vascular Surgery | Admitting: Vascular Surgery

## 2018-06-26 DIAGNOSIS — Z01812 Encounter for preprocedural laboratory examination: Secondary | ICD-10-CM | POA: Diagnosis present

## 2018-06-26 DIAGNOSIS — Z1159 Encounter for screening for other viral diseases: Secondary | ICD-10-CM | POA: Diagnosis not present

## 2018-06-27 LAB — NOVEL CORONAVIRUS, NAA (HOSP ORDER, SEND-OUT TO REF LAB; TAT 18-24 HRS): SARS-CoV-2, NAA: NOT DETECTED

## 2018-06-28 ENCOUNTER — Other Ambulatory Visit: Payer: Self-pay

## 2018-06-28 ENCOUNTER — Encounter (HOSPITAL_COMMUNITY): Payer: Self-pay | Admitting: *Deleted

## 2018-06-28 ENCOUNTER — Other Ambulatory Visit (HOSPITAL_COMMUNITY): Payer: Medicare Other

## 2018-06-28 NOTE — Progress Notes (Signed)
[name] denies chest painor shortness of breath.  COVID Test was negative 06/26/2018, [patient has been qurantine, no s/s of COVID.  [name] has type II diabetes, she reports CBGs run 115 - 127. I instructed patient to take 6 units of Lantus tonight.  If CBG > 220 in am take 1/2 of SS Insulin.I instructed patient to check CBG after awaking and every 2 hours until arrival  to the hospital.  I Instructed patient if CBG is less than 70 to take 4 Glucose Tablets. Recheck CBG in 15 minutes then call pre- op desk at [phone] for further instructions  I requested records form Dr Einar Gip.

## 2018-06-29 ENCOUNTER — Encounter (HOSPITAL_COMMUNITY)
Admission: RE | Disposition: A | Discharge status: Home / Self Care | Payer: Self-pay | Source: Home / Self Care | Attending: Vascular Surgery

## 2018-06-29 ENCOUNTER — Ambulatory Visit (HOSPITAL_COMMUNITY): Payer: Medicare Other | Admitting: Anesthesiology

## 2018-06-29 ENCOUNTER — Other Ambulatory Visit: Payer: Self-pay

## 2018-06-29 ENCOUNTER — Ambulatory Visit (HOSPITAL_COMMUNITY)
Admission: RE | Admit: 2018-06-29 | Discharge: 2018-06-29 | Disposition: A | Discharge status: Home / Self Care | Payer: Medicare Other | Attending: Vascular Surgery | Admitting: Vascular Surgery

## 2018-06-29 ENCOUNTER — Encounter (HOSPITAL_COMMUNITY): Payer: Self-pay

## 2018-06-29 DIAGNOSIS — Z79899 Other long term (current) drug therapy: Secondary | ICD-10-CM | POA: Diagnosis not present

## 2018-06-29 DIAGNOSIS — E11319 Type 2 diabetes mellitus with unspecified diabetic retinopathy without macular edema: Secondary | ICD-10-CM | POA: Insufficient documentation

## 2018-06-29 DIAGNOSIS — Z6831 Body mass index (BMI) 31.0-31.9, adult: Secondary | ICD-10-CM | POA: Insufficient documentation

## 2018-06-29 DIAGNOSIS — F329 Major depressive disorder, single episode, unspecified: Secondary | ICD-10-CM | POA: Insufficient documentation

## 2018-06-29 DIAGNOSIS — E1142 Type 2 diabetes mellitus with diabetic polyneuropathy: Secondary | ICD-10-CM | POA: Insufficient documentation

## 2018-06-29 DIAGNOSIS — E1122 Type 2 diabetes mellitus with diabetic chronic kidney disease: Secondary | ICD-10-CM | POA: Diagnosis not present

## 2018-06-29 DIAGNOSIS — I129 Hypertensive chronic kidney disease with stage 1 through stage 4 chronic kidney disease, or unspecified chronic kidney disease: Secondary | ICD-10-CM | POA: Insufficient documentation

## 2018-06-29 DIAGNOSIS — Y832 Surgical operation with anastomosis, bypass or graft as the cause of abnormal reaction of the patient, or of later complication, without mention of misadventure at the time of the procedure: Secondary | ICD-10-CM | POA: Insufficient documentation

## 2018-06-29 DIAGNOSIS — N185 Chronic kidney disease, stage 5: Secondary | ICD-10-CM | POA: Diagnosis not present

## 2018-06-29 DIAGNOSIS — M1990 Unspecified osteoarthritis, unspecified site: Secondary | ICD-10-CM | POA: Diagnosis not present

## 2018-06-29 DIAGNOSIS — Z794 Long term (current) use of insulin: Secondary | ICD-10-CM | POA: Diagnosis not present

## 2018-06-29 DIAGNOSIS — E785 Hyperlipidemia, unspecified: Secondary | ICD-10-CM | POA: Insufficient documentation

## 2018-06-29 DIAGNOSIS — Z8673 Personal history of transient ischemic attack (TIA), and cerebral infarction without residual deficits: Secondary | ICD-10-CM | POA: Diagnosis not present

## 2018-06-29 DIAGNOSIS — I2510 Atherosclerotic heart disease of native coronary artery without angina pectoris: Secondary | ICD-10-CM | POA: Diagnosis not present

## 2018-06-29 DIAGNOSIS — T829XXA Unspecified complication of cardiac and vascular prosthetic device, implant and graft, initial encounter: Secondary | ICD-10-CM | POA: Diagnosis present

## 2018-06-29 DIAGNOSIS — K219 Gastro-esophageal reflux disease without esophagitis: Secondary | ICD-10-CM | POA: Insufficient documentation

## 2018-06-29 DIAGNOSIS — Z7982 Long term (current) use of aspirin: Secondary | ICD-10-CM | POA: Insufficient documentation

## 2018-06-29 DIAGNOSIS — T82898A Other specified complication of vascular prosthetic devices, implants and grafts, initial encounter: Secondary | ICD-10-CM

## 2018-06-29 DIAGNOSIS — N184 Chronic kidney disease, stage 4 (severe): Secondary | ICD-10-CM

## 2018-06-29 DIAGNOSIS — N189 Chronic kidney disease, unspecified: Secondary | ICD-10-CM | POA: Diagnosis not present

## 2018-06-29 DIAGNOSIS — E669 Obesity, unspecified: Secondary | ICD-10-CM | POA: Insufficient documentation

## 2018-06-29 HISTORY — PX: FISTULA SUPERFICIALIZATION: SHX6341

## 2018-06-29 LAB — POCT I-STAT 4, (NA,K, GLUC, HGB,HCT)
Glucose, Bld: 158 mg/dL — ABNORMAL HIGH (ref 70–99)
HCT: 29 % — ABNORMAL LOW (ref 36.0–46.0)
Hemoglobin: 9.9 g/dL — ABNORMAL LOW (ref 12.0–15.0)
Potassium: 4 mmol/L (ref 3.5–5.1)
Sodium: 138 mmol/L (ref 135–145)

## 2018-06-29 LAB — GLUCOSE, CAPILLARY
Glucose-Capillary: 165 mg/dL — ABNORMAL HIGH (ref 70–99)
Glucose-Capillary: 180 mg/dL — ABNORMAL HIGH (ref 70–99)

## 2018-06-29 SURGERY — FISTULA SUPERFICIALIZATION
Anesthesia: General | Site: Arm Upper | Laterality: Left

## 2018-06-29 MED ORDER — 0.9 % SODIUM CHLORIDE (POUR BTL) OPTIME
TOPICAL | Status: DC | PRN
  Administered 2018-06-29: 1000 mL

## 2018-06-29 MED ORDER — LIDOCAINE 2% (20 MG/ML) 5 ML SYRINGE
INTRAMUSCULAR | Status: AC
  Filled 2018-06-29: qty 5

## 2018-06-29 MED ORDER — CEFAZOLIN SODIUM-DEXTROSE 2-4 GM/100ML-% IV SOLN
2.0000 g | INTRAVENOUS | Status: AC
  Administered 2018-06-29: 2 g via INTRAVENOUS
  Filled 2018-06-29: qty 100

## 2018-06-29 MED ORDER — OXYCODONE-ACETAMINOPHEN 5-325 MG PO TABS: 1.0000 | ORAL_TABLET | Freq: Four times a day (QID) | ORAL | 0 refills | Status: DC | PRN

## 2018-06-29 MED ORDER — EPHEDRINE 5 MG/ML INJ
INTRAVENOUS | Status: AC
  Filled 2018-06-29: qty 10

## 2018-06-29 MED ORDER — SODIUM CHLORIDE 0.9 % IV SOLN
INTRAVENOUS | Status: AC
  Filled 2018-06-29: qty 1.2

## 2018-06-29 MED ORDER — PROPOFOL 10 MG/ML IV BOLUS
INTRAVENOUS | Status: AC
  Filled 2018-06-29: qty 20

## 2018-06-29 MED ORDER — SUCCINYLCHOLINE CHLORIDE 200 MG/10ML IV SOSY
PREFILLED_SYRINGE | INTRAVENOUS | Status: AC
  Filled 2018-06-29: qty 10

## 2018-06-29 MED ORDER — ONDANSETRON HCL 4 MG/2ML IJ SOLN
INTRAMUSCULAR | Status: DC | PRN
  Administered 2018-06-29: 4 mg via INTRAVENOUS

## 2018-06-29 MED ORDER — FENTANYL CITRATE (PF) 250 MCG/5ML IJ SOLN
INTRAMUSCULAR | Status: DC | PRN
  Administered 2018-06-29 (×3): 25 ug via INTRAVENOUS
  Administered 2018-06-29: 50 ug via INTRAVENOUS
  Administered 2018-06-29: 25 ug via INTRAVENOUS

## 2018-06-29 MED ORDER — FENTANYL CITRATE (PF) 250 MCG/5ML IJ SOLN
INTRAMUSCULAR | Status: AC
  Filled 2018-06-29: qty 5

## 2018-06-29 MED ORDER — LIDOCAINE HCL (PF) 1 % IJ SOLN
INTRAMUSCULAR | Status: AC
  Filled 2018-06-29: qty 30

## 2018-06-29 MED ORDER — LIDOCAINE HCL 1 % IJ SOLN
INTRAMUSCULAR | Status: DC | PRN
  Administered 2018-06-29: 10 mL

## 2018-06-29 MED ORDER — PROPOFOL 10 MG/ML IV BOLUS
INTRAVENOUS | Status: DC | PRN
  Administered 2018-06-29: 120 mg via INTRAVENOUS
  Administered 2018-06-29: 30 mg via INTRAVENOUS
  Administered 2018-06-29: 50 mg via INTRAVENOUS

## 2018-06-29 MED ORDER — SODIUM CHLORIDE 0.9 % IV SOLN
INTRAVENOUS | Status: DC
  Administered 2018-06-29: 07:00:00 via INTRAVENOUS

## 2018-06-29 MED ORDER — SODIUM CHLORIDE 0.9 % IV SOLN
INTRAVENOUS | Status: DC | PRN
  Administered 2018-06-29: 15 ug/min via INTRAVENOUS

## 2018-06-29 MED ORDER — ONDANSETRON HCL 4 MG/2ML IJ SOLN
INTRAMUSCULAR | Status: AC
  Filled 2018-06-29: qty 6

## 2018-06-29 MED ORDER — EPHEDRINE SULFATE 50 MG/ML IJ SOLN
INTRAMUSCULAR | Status: DC | PRN
  Administered 2018-06-29 (×4): 10 mg via INTRAVENOUS

## 2018-06-29 MED ORDER — LIDOCAINE 2% (20 MG/ML) 5 ML SYRINGE
INTRAMUSCULAR | Status: DC | PRN
  Administered 2018-06-29: 40 mg via INTRAVENOUS

## 2018-06-29 MED ORDER — PHENYLEPHRINE 40 MCG/ML (10ML) SYRINGE FOR IV PUSH (FOR BLOOD PRESSURE SUPPORT)
PREFILLED_SYRINGE | INTRAVENOUS | Status: AC
  Filled 2018-06-29: qty 10

## 2018-06-29 MED ORDER — SODIUM CHLORIDE 0.9 % IV SOLN
INTRAVENOUS | Status: DC | PRN
  Administered 2018-06-29: 500 mL

## 2018-06-29 MED ORDER — ROCURONIUM BROMIDE 10 MG/ML (PF) SYRINGE
PREFILLED_SYRINGE | INTRAVENOUS | Status: AC
  Filled 2018-06-29: qty 10

## 2018-06-29 MED ORDER — GLYCOPYRROLATE 0.2 MG/ML IJ SOLN
INTRAMUSCULAR | Status: DC | PRN
  Administered 2018-06-29: 0.2 mg via INTRAVENOUS

## 2018-06-29 SURGICAL SUPPLY — 32 items
ARMBAND PINK RESTRICT EXTREMIT (MISCELLANEOUS) ×2 IMPLANT
CANISTER SUCT 3000ML PPV (MISCELLANEOUS) ×2 IMPLANT
CLIP VESOCCLUDE MED 6/CT (CLIP) ×2 IMPLANT
CLIP VESOCCLUDE SM WIDE 6/CT (CLIP) ×2 IMPLANT
COVER PROBE W GEL 5X96 (DRAPES) IMPLANT
COVER WAND RF STERILE (DRAPES) ×2 IMPLANT
DECANTER SPIKE VIAL GLASS SM (MISCELLANEOUS) ×2 IMPLANT
DERMABOND ADVANCED (GAUZE/BANDAGES/DRESSINGS) ×1
DERMABOND ADVANCED .7 DNX12 (GAUZE/BANDAGES/DRESSINGS) ×1 IMPLANT
ELECT REM PT RETURN 9FT ADLT (ELECTROSURGICAL) ×2
ELECTRODE REM PT RTRN 9FT ADLT (ELECTROSURGICAL) ×1 IMPLANT
GLOVE BIO SURGEON STRL SZ7.5 (GLOVE) ×2 IMPLANT
GLOVE BIOGEL PI IND STRL 8 (GLOVE) ×1 IMPLANT
GLOVE BIOGEL PI INDICATOR 8 (GLOVE) ×1
GOWN STRL REUS W/ TWL LRG LVL3 (GOWN DISPOSABLE) ×2 IMPLANT
GOWN STRL REUS W/ TWL XL LVL3 (GOWN DISPOSABLE) ×2 IMPLANT
GOWN STRL REUS W/TWL LRG LVL3 (GOWN DISPOSABLE) ×2
GOWN STRL REUS W/TWL XL LVL3 (GOWN DISPOSABLE) ×2
HEMOSTAT SPONGE AVITENE ULTRA (HEMOSTASIS) IMPLANT
KIT BASIN OR (CUSTOM PROCEDURE TRAY) ×2 IMPLANT
KIT TURNOVER KIT B (KITS) ×2 IMPLANT
NS IRRIG 1000ML POUR BTL (IV SOLUTION) ×2 IMPLANT
PACK CV ACCESS (CUSTOM PROCEDURE TRAY) ×2 IMPLANT
PAD ARMBOARD 7.5X6 YLW CONV (MISCELLANEOUS) ×4 IMPLANT
SUT MNCRL AB 4-0 PS2 18 (SUTURE) ×4 IMPLANT
SUT PROLENE 6 0 BV (SUTURE) IMPLANT
SUT PROLENE 7 0 BV 1 (SUTURE) ×2 IMPLANT
SUT VIC AB 3-0 SH 27 (SUTURE) ×3
SUT VIC AB 3-0 SH 27X BRD (SUTURE) ×3 IMPLANT
TOWEL GREEN STERILE (TOWEL DISPOSABLE) ×2 IMPLANT
UNDERPAD 30X30 (UNDERPADS AND DIAPERS) ×2 IMPLANT
WATER STERILE IRR 1000ML POUR (IV SOLUTION) ×2 IMPLANT

## 2018-06-29 NOTE — H&P (Signed)
History and Physical Interval Note:  06/29/2018 7:27 AM  [name]  has presented today for surgery, with the diagnosis of FISTULA COMPLICATION.  The various methods of treatment have been discussed with the patient and family. After consideration of risks, benefits and other options for treatment, the patient has consented to  Procedure(s): FISTULA SUPERFICIALIZATION LEFT BRACHIOCEPHALIC (Left) as a surgical intervention.  The patient's history has been reviewed, patient examined, no change in status, stable for surgery.  I have reviewed the patient's chart and labs.  Questions were answered to the patient's satisfaction.    L arm AVF revision with superficialization, sidebranch ligation.   Marty Heck  CC: Follow up AVF creation with duplex and exam, stage 4-5 CKD   History of Present Illness  [name] is a 78 y.o. (11-Mar-1940) female who is s/p leftbrachiocephalic arteriovenous fistula placement on 12-18-17 by Dr. Carlis Abbott for stage 4-5 CKD.  She has not yet started hemodialysis, and states that her GFR has been 13-16 most recently. She hopes not to start hemodialysis any time soon, states Dr. Jimmy Footman has not indicated that she needs to start any time soon.   She reports moderate numbness in her left hand fingertips.   She has never used tobacco.  Her systolic blood pressure is slightly elevated at 650, diastolic at 54.  She has DM, no recent A1C result available. Glucose range from 2 mos ago to 8 years ago is 113-224.        Past Medical History:  Diagnosis Date  . Anginal pain ([place])   . Arthritis   . Cerebrovascular disease   . CKD (chronic kidney disease)    Sees Dr Deterding  . Coronary artery disease   . Depression   . Diabetes ([place])    INSULIN DEPENDENT  . Diabetic peripheral neuropathy ([place])   . Diabetic retinopathy ([place])   . Diverticulitis   . Dyslipidemia   . GERD (gastroesophageal reflux disease)   . Headache(784.0)  01/30/2013  . Heart murmur   . History of cerebrovascular accident 01/30/2013  . Hyperlipidemia   . Hypertension   . Migraine   . Obesity   . TIA (transient ischemic attack)    " MULTIPLE PER PATIENT "; had left sided weakness- rehab and wants without assistance    Social History Social History       Tobacco Use  . Smoking status: Never Smoker  . Smokeless tobacco: Never Used  Substance Use Topics  . Alcohol use: No  . Drug use: No    Family History      Family History  Problem Relation Age of Onset  . Diabetes Mother   . Heart disease Mother   . Prostate cancer Father   . Hypertension Brother   . Prostate cancer Brother     Surgical History      Past Surgical History:  Procedure Laterality Date  . ABDOMINAL HYSTERECTOMY    . AV FISTULA PLACEMENT Left 12/18/2017   Procedure: ARTERIOVENOUS (AV) FISTULA CREATION ARM;  Surgeon: Marty Heck, MD;  Location: [facility];  Service: Vascular;  Laterality: Left;  . bilateral cataract surgery    . CARDIAC CATHETERIZATION  01/07/2014   DR Cheatham RESECTION  02/1999  . COLONOSCOPY    . COLOSTOMY  02/1999  . COLOSTOMY CLOSURE  07/1999  . LEFT HEART CATHETERIZATION WITH CORONARY ANGIOGRAM N/A 01/07/2014   Procedure: LEFT HEART CATHETERIZATION WITH CORONARY ANGIOGRAM;  Surgeon: Laverda Page, MD;  Location: [facility];  Service: Cardiovascular;  Laterality: N/A;  . middle cerebral artery stent placement Right   . OTHER SURGICAL HISTORY     laser surgery  . PTCA  01/07/2014   DES to RCA    DR Einar Gip  . right knee surgery     for infection    No Known Allergies        Current Outpatient Medications  Medication Sig Dispense Refill  . allopurinol (ZYLOPRIM) 100 MG tablet Take 50 mg by mouth daily.    Marland Kitchen amLODipine (NORVASC) 10 MG tablet Take 10 mg by mouth daily.    Marland Kitchen aspirin 81 MG tablet Take 1 tablet (81 mg total) by mouth daily. 30 tablet   . B-D INS SYR  ULTRAFINE 1CC/30G 30G X 1/2" 1 ML MISC Inject 1 each into the skin 3 (three) times daily between meals.    . calcitRIOL (ROCALTROL) 0.25 MCG capsule Take 0.5 mcg by mouth daily.    . calcium carbonate (OSCAL) 1500 (600 Ca) MG TABS tablet Take 600 mg by mouth daily.    . cloNIDine (CATAPRES) 0.1 MG tablet Take 0.1 mg by mouth 2 (two) times daily.    . famotidine (PEPCID) 40 MG tablet Take 1 tablet (40 mg total) by mouth 2 (two) times daily. 30 tablet 6  . Ferrous Sulfate (IRON) 142 (45 Fe) MG TBCR Take 142 mg by mouth 2 (two) times daily.    . folic acid (FOLVITE) 1 MG tablet Take 1 mg by mouth daily.    . insulin glargine (LANTUS) 100 UNIT/ML injection Inject 13-15 Units into the skin at bedtime.     . insulin lispro (HUMALOG) 100 UNIT/ML injection Inject 4 Units into the skin 3 (three) times daily before meals.    . insulin regular (NOVOLIN R,HUMULIN R) 100 units/mL injection Inject 5-10 Units into the skin 3 (three) times daily before meals.    Marland Kitchen labetalol (NORMODYNE) 300 MG tablet Take 300 mg by mouth 2 (two) times daily.    Marland Kitchen leflunomide (ARAVA) 10 MG tablet Take 10 mg by mouth daily.    Marland Kitchen lisinopril (PRINIVIL,ZESTRIL) 5 MG tablet Take 5 mg by mouth daily.    . Magnesium 250 MG TABS Take 250 mg by mouth daily.    . niacin (NIASPAN) 500 MG CR tablet     . niacin 500 MG CR capsule Take 500 mg by mouth daily.    Marland Kitchen NITROSTAT 0.4 MG SL tablet Place 0.4 mg under the tongue every 5 (five) minutes as needed for chest pain.   1  . nortriptyline (PAMELOR) 10 MG capsule Take 10 mg by mouth daily.    Marland Kitchen omeprazole (PRILOSEC) 40 MG capsule Take 40 mg by mouth daily.    . ONE TOUCH ULTRA TEST test strip 1 each by Other route 3 (three) times daily between meals.    Marland Kitchen PRISTIQ 100 MG 24 hr tablet Take 100 mg by mouth every morning.     . rosuvastatin (CRESTOR) 20 MG tablet Take 20 mg by mouth daily.    Marland Kitchen torsemide (DEMADEX) 20 MG tablet Take 20 mg by mouth daily.     . insulin NPH-regular Human (NOVOLIN 70/30) (70-30) 100 UNIT/ML injection 20 units subcutaneously in the morning, 40 units subcutaneously in the evening (Patient not taking: Reported on 04/19/2018) 10 mL    No current facility-administered medications for this visit.      REVIEW OF SYSTEMS: see HPI for pertinent positives and negatives  PHYSICAL EXAMINATION:     Vitals:   04/19/18 0921  BP: (!) 159/54  Pulse: (!) 54  Resp: 16  Temp: 97.9 F (36.6 C)  TempSrc: Oral  SpO2: 100%  Weight: 168 lb (76.2 kg)  Height: 5\' 3"  (1.6 m)   Body mass index is 29.76 kg/m.  General: The patient appears her stated age, in NAD, appears comfortable, is smiling.    HEENT:  No gross abnormalities Pulmonary: Respirations are non-labored Abdomen: Soft and non-tender  Musculoskeletal: There are no major deformities.   Neurologic: No focal weakness or paresthesias are detected, hand grip is 5/5 bilaterally.  Skin: There are no ulcer or rashes noted. Psychiatric: The patient has normal affect. Cardiovascular: There is a regular rate and rhythm without significant murmur appreciated.     Non-Invasive Vascular Imaging  Left arm Access Duplex  (Date: 04/19/2018):  Findings: +--------------------+----------+-----------------+--------+ AVF PSV (cm/s)Flow Vol (mL/min)Comments +--------------------+----------+-----------------+--------+ Native artery inflow 234  455   +--------------------+----------+-----------------+--------+ AVF Anastomosis  487    +--------------------+----------+-----------------+--------+  +------------+----------+-------------+----------+---------------------------+ OUTFLOW VEINPSV (cm/s)Diameter (cm)Depth (cm) Describe  +------------+----------+-------------+----------+---------------------------+ Prox UA  136  0.56  1.53  branches 0.276 and 0.239 cm +------------+----------+-------------+----------+---------------------------+ Mid UA  156  0.71  0.97  Retained valve  +------------+----------+-------------+----------+---------------------------+ Dist UA  162  0.74  0.75   +------------+----------+-------------+----------+---------------------------+ AC Fossa  813  0.27  0.87   +------------+----------+-------------+----------+---------------------------+  Summary: Narrow segment of outflow vein at the ACF measuring approximately 1.95cm with elevated velocities. Non hemodynamically significant elevation of velocity in the distal outflow vein due to retained valve.    Medical Decision Making  [name] is a 78 y.o. female who is s/p leftbrachiocephalic arteriovenous fistula placement on 12-18-17 by Dr. Carlis Abbott for stage 4-5 CKD.  She has not yet started hemodialysis, and states that her GFR has been 13-16 most recently. She hopes not to start hemodialysis any time soon, states Dr. Jimmy Footman has not indicated that she needs to start any time soon.    AVF duplex today indicates that she will need superficialization of her AVF at some point. I discussed this with Dr. Oneida Alar who agrees.     Clemon Chambers, RN, MSN, FNP-C [facility] Office: [phone]  04/19/2018, 6:59 PM  Clinic RD:[id number]

## 2018-06-29 NOTE — Anesthesia Postprocedure Evaluation (Signed)
Anesthesia Post Note  Patient: [name]  Procedure(s) Performed: FISTULA SUPERFICIALIZATION LEFT BRACHIOCEPHALIC (Left Arm Upper)     Patient location during evaluation: PACU Anesthesia Type: General Level of consciousness: awake and alert Pain management: pain level controlled Vital Signs Assessment: post-procedure vital signs reviewed and stable Respiratory status: spontaneous breathing, nonlabored ventilation, respiratory function stable and patient connected to nasal cannula oxygen Cardiovascular status: blood pressure returned to baseline and stable Postop Assessment: no apparent nausea or vomiting Anesthetic complications: no    Last Vitals:  Vitals:   06/29/18 0944 06/29/18 0954  BP: (!) 144/42 (!) 133/47  Pulse: 62 64  Resp: 14 15  Temp: (!) 36.3 C   SpO2: 97% 100%    Last Pain:  Vitals:   06/29/18 0954  TempSrc:   PainSc: 0-No pain                 Estaban [place] COKER

## 2018-06-29 NOTE — Op Note (Signed)
Date: June 29, 2018  Preoperative diagnosis: Too deep to cannulate left brachiocephalic fistula  Postoperative diagnosis: Same  Procedure: 1.  Left upper extremity brachiocephalic AV fistula revision with sidebranch ligation x3 and superficialization  Surgeon: Dr. Marty Heck, MD  Assistant: Leontine Locket, PA  Indications: Patient is a 78 year old female who previously underwent placement of a left brachiocephalic AV fistula.  Fistula has matured nicely in the mid to upper arm but remains smaller down toward the antecubitum.  There were multiple side branches identified on the fistula that needed to be ligated to facilitate maturation and in addition fistula was too deep to cannulate.  She presents today for sidebranch ligation and superficialization after risks and benefits were discussed.  Findings: Three small skip incisions were made over the fistula in the upper arm and the vein was circumferentially mobilized.  Two large side branches and one small side branch was ligated.  The vein was then superficialized.  There was an excellent thrill at end of the case.  I did not see any focal stenosis down near the anastomosis that needed surgical revision.  Anesthesia: LMA  Details: The patient was taken to the operating room after informed consent was obtained.  She was placed on operative table in supine position.  After anesthesia was induced her left arm was prepped and draped in usual sterile fashion.  Preop timeout was performed to identify patient, procedure and site.  Initially used ultrasound to identify the cephalic vein in her upper arm and this was marked on the skin.  I then made three separate small skip incisions over the fistula and the vein was then circumferentially mobilized with blunt dissection and Bovie cautery.  Identified three side branches in the mid to upper arm that were all ligated with 3-0 silk ties and divided.  I then closed the subcutaneous tissue under the  fistula with 3-0 Vicryl in interrupted fashion to superficialize it.  I then ran the skin closed directly over the fistula with 4-0 Monocryl and Dermabond was applied.  There was an excellent thrill in the fistula as well as radial pulses at completion of the case.  She tolerated the procedure without any apparent complications.  Condition: Stable  Complications: None  Marty Heck, MD [facility] Office: [phone] Pager: [phone]

## 2018-06-29 NOTE — Transfer of Care (Signed)
Immediate Anesthesia Transfer of Care Note  Patient: [name]  Procedure(s) Performed: FISTULA SUPERFICIALIZATION LEFT BRACHIOCEPHALIC (Left Arm Upper)  Patient Location: PACU  Anesthesia Type:General  Level of Consciousness: awake, alert  and oriented  Airway & Oxygen Therapy: Patient Spontanous Breathing and Patient connected to face mask oxygen  Post-op Assessment: Report given to RN and Post -op Vital signs reviewed and stable  Post vital signs: Reviewed and stable  Last Vitals:  Vitals Value Taken Time  BP 130/63   Temp    Pulse 71 06/29/18 0903  Resp 20 06/29/18 0903  SpO2 99 % 06/29/18 0903  Vitals shown include unvalidated device data.  Last Pain:  Vitals:   06/29/18 0633  TempSrc:   PainSc: 0-No pain      Patients Stated Pain Goal: 0 (16/60/60 0459)  Complications: No apparent anesthesia complications

## 2018-06-29 NOTE — Anesthesia Preprocedure Evaluation (Signed)
Anesthesia Evaluation  Patient identified by MRN, date of birth, ID band Patient awake    Reviewed: Allergy & Precautions, NPO status , Patient's Chart, lab work & pertinent test results  Airway Mallampati: II  TM Distance: >3 FB Neck ROM: Full    Dental  (+) Teeth Intact, Dental Advisory Given   Pulmonary    breath sounds clear to auscultation       Cardiovascular hypertension,  Rhythm:Regular Rate:Normal     Neuro/Psych    GI/Hepatic   Endo/Other  diabetes  Renal/GU      Musculoskeletal   Abdominal   Peds  Hematology   Anesthesia Other Findings   Reproductive/Obstetrics                             Anesthesia Physical Anesthesia Plan  ASA: III  Anesthesia Plan: General   Post-op Pain Management:    Induction: Intravenous  PONV Risk Score and Plan: Ondansetron  Airway Management Planned: LMA  Additional Equipment:   Intra-op Plan:   Post-operative Plan:   Informed Consent: I have reviewed the patients History and Physical, chart, labs and discussed the procedure including the risks, benefits and alternatives for the proposed anesthesia with the patient or authorized representative who has indicated his/her understanding and acceptance.     Dental advisory given  Plan Discussed with: CRNA and Anesthesiologist  Anesthesia Plan Comments:         Anesthesia Quick Evaluation

## 2018-06-29 NOTE — Discharge Instructions (Signed)
° °  [facility]  Discharge Instructions  AV Fistula or Graft Surgery for Dialysis Access  Please refer to the following instructions for your post-procedure care. Your surgeon or physician assistant will discuss any changes with you.  Activity  You may drive the day following your surgery, if you are comfortable and no longer taking prescription pain medication. Resume full activity as the soreness in your incision resolves.  Bathing/Showering  You may shower after you go home. Keep your incision dry for 48 hours. Do not soak in a bathtub, hot tub, or swim until the incision heals completely. You may not shower if you have a hemodialysis catheter.  Incision Care  Clean your incision with mild soap and water after 48 hours. Pat the area dry with a clean towel. You do not need a bandage unless otherwise instructed. Do not apply any ointments or creams to your incision. You may have skin glue on your incision. Do not peel it off. It will come off on its own in about one week. Your arm may swell a bit after surgery. To reduce swelling use pillows to elevate your arm so it is above your heart. Your doctor will tell you if you need to lightly wrap your arm with an ACE bandage.  Diet  Resume your normal diet. There are not special food restrictions following this procedure. In order to heal from your surgery, it is CRITICAL to get adequate nutrition. Your body requires vitamins, minerals, and protein. Vegetables are the best source of vitamins and minerals. Vegetables also provide the perfect balance of protein. Processed food has little nutritional value, so try to avoid this.  Medications  Resume taking all of your medications. If your incision is causing pain, you may take over-the counter pain relievers such as acetaminophen (Tylenol). If you were prescribed a stronger pain medication, please be aware these medications can cause nausea and constipation. Prevent  nausea by taking the medication with a snack or meal. Avoid constipation by drinking plenty of fluids and eating foods with high amount of fiber, such as fruits, vegetables, and grains.  Do not take Tylenol if you are taking prescription pain medications.  Follow up Your surgeon may want to see you in the office following your access surgery. If so, this will be arranged at the time of your surgery.  Please call us immediately for any of the following conditions:  Increased pain, redness, drainage (pus) from your incision site Fever of 101 degrees or higher Severe or worsening pain at your incision site Hand pain or numbness.  Reduce your risk of vascular disease:  Stop smoking. If you would like help, call [facility] at 1-800-QUIT-NOW 626-199-0027) or [place] at [place] your cholesterol Maintain a desired weight Control your diabetes Keep your blood pressure down  Dialysis  It will take several weeks to several months for your new dialysis access to be ready for use. Your surgeon will determine when it is okay to use it. Your nephrologist will continue to direct your dialysis. You can continue to use your Permcath until your new access is ready for use.   06/29/2018 [name] [id number] [birth year]  Surgeon(s): Marty Heck, MD  Procedure(s): FISTULA SUPERFICIALIZATION LEFT BRACHIOCEPHALIC FISTULA  x Do not stick fistula for 4 weeks    If you have any questions, please call the office at [phone].

## 2018-06-29 NOTE — Anesthesia Procedure Notes (Signed)
Procedure Name: LMA Insertion Date/Time: 06/29/2018 7:41 AM Performed by: Jearld Pies, CRNA Pre-anesthesia Checklist: Patient identified, Emergency Drugs available, Suction available and Patient being monitored Patient Re-evaluated:Patient Re-evaluated prior to induction Oxygen Delivery Method: Circle System Utilized Preoxygenation: Pre-oxygenation with 100% oxygen Induction Type: IV induction LMA: LMA inserted LMA Size: 4.0 Number of attempts: 1 Airway Equipment and Method: Bite block Placement Confirmation: positive ETCO2 Tube secured with: Tape Dental Injury: Teeth and Oropharynx as per pre-operative assessment

## 2018-06-30 ENCOUNTER — Encounter (HOSPITAL_COMMUNITY): Payer: Self-pay | Admitting: Vascular Surgery

## 2018-07-13 NOTE — Progress Notes (Signed)
POST OPERATIVE OFFICE NOTE    CC:  F/u for surgery  HPI:  [name] who is s/p Left upper extremity brachiocephalic AV fistula revision with sidebranch ligation x3 and superficialization by Dr. Carlis Abbott on 06/29/2018.  She presents today for follow up.  She is doing very well.  She denies pain in her hand.  She is not yet on dialysis.  She is followed by Dr. Jimmy Footman.    No Known Allergies  Current Outpatient Medications  Medication Sig Dispense Refill   acetaminophen (TYLENOL) 325 MG tablet Take 650 mg by mouth every 6 (six) hours as needed (pain/headaches.).     allopurinol (ZYLOPRIM) 100 MG tablet Take 50 mg by mouth daily.     amLODipine (NORVASC) 10 MG tablet Take 10 mg by mouth daily.     aspirin EC 81 MG tablet Take 81 mg by mouth daily.     B-D INS SYR ULTRAFINE 1CC/30G 30G X 1/2" 1 ML MISC Inject 1 each into the skin 3 (three) times daily between meals.     calcitRIOL (ROCALTROL) 0.25 MCG capsule Take 0.5 mcg by mouth daily.     calcium carbonate (OSCAL) 1500 (600 Ca) MG TABS tablet Take 600 mg by mouth daily.     Ferrous Sulfate (IRON) 142 (45 Fe) MG TBCR Take 142 mg by mouth 2 (two) times daily.     folic acid (FOLVITE) 1 MG tablet Take 1 mg by mouth daily.     HUMALOG KWIKPEN 100 UNIT/ML KwikPen 4-6 Units by Subconjunctival route 3 (three) times daily before meals. Sliding Scale Insulin     insulin glargine (LANTUS) 100 UNIT/ML injection Inject 12 Units into the skin at bedtime.      insulin NPH-regular Human (NOVOLIN 70/30) (70-30) 100 UNIT/ML injection 20 units subcutaneously in the morning, 40 units subcutaneously in the evening (Patient not taking: Reported on 04/19/2018) 10 mL    labetalol (NORMODYNE) 300 MG tablet Take 1 tablet by mouth twice daily (Patient taking differently: Take 300 mg by mouth 2 (two) times daily. ) 60 tablet 11   leflunomide (ARAVA) 10 MG tablet Take 10 mg by mouth daily.     Magnesium 250 MG TABS Take 250 mg by mouth  daily.     niacin (NIASPAN) 500 MG CR tablet Take 500 mg by mouth daily.      NITROSTAT 0.4 MG SL tablet Place 0.4 mg under the tongue every 5 (five) minutes x 3 doses as needed for chest pain.   1   nortriptyline (PAMELOR) 10 MG capsule Take 10 mg by mouth daily.     omeprazole (PRILOSEC) 40 MG capsule Take 40 mg by mouth daily.     ONE TOUCH ULTRA TEST test strip 1 each by Other route 3 (three) times daily between meals.     oxyCODONE-acetaminophen (PERCOCET) 5-325 MG tablet Take 1 tablet by mouth every 6 (six) hours as needed for severe pain. 12 tablet 0   polyvinyl alcohol (TEARS AGAIN) 1.4 % ophthalmic solution Place 1 drop into both eyes 3 (three) times daily as needed for dry eyes.     PRISTIQ 100 MG 24 hr tablet Take 100 mg by mouth daily.      rosuvastatin (CRESTOR) 20 MG tablet Take 20 mg by mouth daily.     torsemide (DEMADEX) 20 MG tablet Take 20 mg by mouth daily.     No current facility-administered medications for this visit.      ROS:  See  HPI  Physical Exam:  Today's Vitals   07/17/18 1436  BP: (!) 129/58  Resp: 12  Temp: 97.9 F (36.6 C)  TempSrc: Temporal  SpO2: 100%  Weight: 169 lb 9.6 oz (76.9 kg)  Height: 5\' 2"  (1.575 m)  PainSc: 0-No pain   Body mass index is 31.02 kg/m.   Incision:  Upper arm incisions have healed nicely. Extremities:  Easily palpable left radial pulse; strong grip left hand; motor and sensation are in tact.  Fistula with excellent thrill.     Assessment/Plan:  [name] who is s/p: Left upper extremity brachiocephalic AV fistula revision with sidebranch ligation x3 and superficialization by Dr. Carlis Abbott on 06/29/2018.  -pt fistula has excellent thrill and easily palpable.   -she is not yet on dialysis.  Her fistula is ready for use should she need dialysis. -she will f/u with [name] as needed.    Leontine Locket, PA-C [facility] [phone]  Clinic MD:  [place]

## 2018-07-16 ENCOUNTER — Telehealth (HOSPITAL_COMMUNITY): Payer: Self-pay | Admitting: Rehabilitation

## 2018-07-16 ENCOUNTER — Encounter: Payer: Self-pay | Admitting: Cardiology

## 2018-07-16 ENCOUNTER — Other Ambulatory Visit: Payer: Self-pay

## 2018-07-16 ENCOUNTER — Ambulatory Visit (INDEPENDENT_AMBULATORY_CARE_PROVIDER_SITE_OTHER): Admitting: Cardiology

## 2018-07-16 VITALS — BP 127/45 | HR 51 | Ht 62.0 in | Wt 172.0 lb

## 2018-07-16 DIAGNOSIS — N184 Chronic kidney disease, stage 4 (severe): Secondary | ICD-10-CM

## 2018-07-16 DIAGNOSIS — R001 Bradycardia, unspecified: Secondary | ICD-10-CM | POA: Diagnosis not present

## 2018-07-16 DIAGNOSIS — R0600 Dyspnea, unspecified: Secondary | ICD-10-CM

## 2018-07-16 DIAGNOSIS — D509 Iron deficiency anemia, unspecified: Secondary | ICD-10-CM

## 2018-07-16 DIAGNOSIS — I129 Hypertensive chronic kidney disease with stage 1 through stage 4 chronic kidney disease, or unspecified chronic kidney disease: Secondary | ICD-10-CM

## 2018-07-16 DIAGNOSIS — I10 Essential (primary) hypertension: Secondary | ICD-10-CM

## 2018-07-16 DIAGNOSIS — I25119 Atherosclerotic heart disease of native coronary artery with unspecified angina pectoris: Secondary | ICD-10-CM | POA: Diagnosis not present

## 2018-07-16 DIAGNOSIS — R0609 Other forms of dyspnea: Secondary | ICD-10-CM | POA: Diagnosis not present

## 2018-07-16 MED ORDER — NITROGLYCERIN 0.4 MG SL SUBL: 0.4000 mg | SUBLINGUAL_TABLET | SUBLINGUAL | 1 refills | Status: DC | PRN

## 2018-07-16 NOTE — Patient Instructions (Signed)
Monitor BP at home and notify the office in the next few days if diastolic (bottom number) continues to be low (less than 60)

## 2018-07-16 NOTE — Telephone Encounter (Signed)

## 2018-07-16 NOTE — Progress Notes (Signed)
Primary Physician:  Merrilee Seashore, MD   Patient ID: [name], female    DOB: June 26, 1940, 78 y.o.   MRN: [id number]  Subjective:    Chief Complaint  Patient presents with  . Shortness of Breath    pt c/o sob,chest pressure for 2 weeks  . Chest Pain    HPI: [name]  is a 78 y.o. female  with history of hypertension, hyperlipidemia, h/o CVA, diabetes with stage III to IV chronic kidney disease (follows Dr. Jeneen Rinks Deterding), mild aortic stenosis and CAD. She also has asymptomatic marked S. Bradycardia.  She had coronary angiogram on 01/07/2014 with implantation of a 2.5 x 14 mm resolute integrity DES to the distal RCA. Mild disease noted in the Cx and LAD.   Patient reports in early June, she underwent placement of left brachiocephalic fistula. Since being home from this, she has noticed exertional fatigue, chest heaviness, and dyspnea each time she does any activity such as walking down to her mailbox or doing things around her house. She has not used any nitroglycerin, symptoms improve with resting. No PND, orthopnea, edema, dizziness, syncope, or symptoms suggestive of claudication or TIA. She originally attributed this to her iron deficiency; however, she states that her recent labs have actually improved.  States that her blood pressure has been well controlled and that her clonidine was discontinued by her nephrologist.   Past Medical History:  Diagnosis Date  . Anginal pain ([place])   . Arthritis   . Cerebrovascular disease   . CKD (chronic kidney disease)    Sees Dr Deterding  . Coronary artery disease   . Depression   . Diabetes ([place])    INSULIN DEPENDENT  . Diabetic peripheral neuropathy ([place])   . Diabetic retinopathy ([place])   . Diverticulitis   . Dyslipidemia   . GERD (gastroesophageal reflux disease)   . Headache(784.0) 01/30/2013  . Heart murmur   . History of cerebrovascular accident 01/30/2013  . Hyperlipidemia   . Hypertension   . Migraine   .  Obesity   . Stroke ([place])    x 2 no residulal  . TIA (transient ischemic attack)    " MULTIPLE PER PATIENT "; had left sided weakness- rehab and wants without assistance    Past Surgical History:  Procedure Laterality Date  . ABDOMINAL HYSTERECTOMY    . AV FISTULA PLACEMENT Left 12/18/2017   Procedure: ARTERIOVENOUS (AV) FISTULA CREATION ARM;  Surgeon: Marty Heck, MD;  Location: [facility];  Service: Vascular;  Laterality: Left;  . bilateral cataract surgery    . CARDIAC CATHETERIZATION  01/07/2014   DR [place] RESECTION  02/1999  . COLONOSCOPY    . COLOSTOMY  02/1999  . COLOSTOMY CLOSURE  07/1999  . EYE SURGERY Left 2019  . FISTULA SUPERFICIALIZATION Left 06/29/2018   Procedure: FISTULA SUPERFICIALIZATION LEFT BRACHIOCEPHALIC;  Surgeon: Marty Heck, MD;  Location: [facility];  Service: Vascular;  Laterality: Left;  . LEFT HEART CATHETERIZATION WITH CORONARY ANGIOGRAM N/A 01/07/2014   Procedure: LEFT HEART CATHETERIZATION WITH CORONARY ANGIOGRAM;  Surgeon: Laverda Page, MD;  Location: [facility] CATH LAB;  Service: Cardiovascular;  Laterality: N/A;  . middle cerebral artery stent placement Right   . OTHER SURGICAL HISTORY     laser surgery  . PTCA  01/07/2014   DES to RCA    DR Einar Gip  . right knee surgery Right    for infection    Social History   Socioeconomic History  .  Marital status: Widowed    Spouse name: Not on file  . Number of children: 4  . Years of education: PHD  . Highest education level: Not on file  Occupational History  . Not on file  Social Needs  . Financial resource strain: Not on file  . Food insecurity    Worry: Not on file    Inability: Not on file  . Transportation needs    Medical: Not on file    Non-medical: Not on file  Tobacco Use  . Smoking status: Never Smoker  . Smokeless tobacco: Never Used  Substance and Sexual Activity  . Alcohol use: No  . Drug use: No  . Sexual activity: Not on file  Lifestyle  . Physical activity     Days per week: Not on file    Minutes per session: Not on file  . Stress: Not on file  Relationships  . Social Herbalist on phone: Not on file    Gets together: Not on file    Attends religious service: Not on file    Active member of club or organization: Not on file    Attends meetings of clubs or organizations: Not on file    Relationship status: Not on file  . Intimate partner violence    Fear of current or ex partner: Not on file    Emotionally abused: Not on file    Physically abused: Not on file    Forced sexual activity: Not on file  Other Topics Concern  . Not on file  Social History Narrative   Patient is widowed and lives with her two adopted children.   Patient is retired.   Patient has two children of her own and two adopted children.   Patient has a PHD.   Patient is right-handed.    Review of Systems  Constitution: Positive for malaise/fatigue. Negative for decreased appetite, weight gain and weight loss.  Eyes: Negative for visual disturbance.  Cardiovascular: Positive for chest pain and dyspnea on exertion. Negative for claudication, leg swelling, orthopnea, palpitations and syncope.  Respiratory: Negative for hemoptysis and wheezing.   Endocrine: Negative for cold intolerance and heat intolerance.  Hematologic/Lymphatic: Does not bruise/bleed easily.  Skin: Negative for nail changes.  Musculoskeletal: Negative for muscle weakness and myalgias.  Gastrointestinal: Negative for abdominal pain, change in bowel habit, nausea and vomiting.  Neurological: Negative for difficulty with concentration, dizziness, focal weakness and headaches.  Psychiatric/Behavioral: Negative for altered mental status and suicidal ideas.  All other systems reviewed and are negative.     Objective:  Blood pressure (!) 127/45, pulse (!) 51, height 5\' 2"  (1.575 m), weight 172 lb (78 kg), SpO2 95 %. Body mass index is 31.46 kg/m.    Physical Exam  Constitutional: She is  oriented to person, place, and time. Vital signs are normal. She appears well-developed and well-nourished.  Mildly obese  HENT:  Head: Normocephalic and atraumatic.  Neck: Normal range of motion.  Cardiovascular: Normal rate, regular rhythm and intact distal pulses.  Murmur heard.  Midsystolic murmur is present with a grade of 3/6 at the upper right sternal border radiating to the neck. Pulses:      Carotid pulses are on the right side with bruit and on the left side with bruit.      Femoral pulses are 2+ on the right side and 2+ on the left side.      Popliteal pulses are 1+ on the right side and  1+ on the left side.       Dorsalis pedis pulses are 2+ on the right side and 2+ on the left side.       Posterior tibial pulses are 2+ on the right side and 2+ on the left side.  Left upper arm AV fistula present  Pulmonary/Chest: Effort normal and breath sounds normal. No accessory muscle usage. No respiratory distress.  Abdominal: Soft. Bowel sounds are normal.  Musculoskeletal: Normal range of motion.  Neurological: She is alert and oriented to person, place, and time.  Skin: Skin is warm and dry.  Vitals reviewed.  Radiology: No results found.  Laboratory examination:    CMP Latest Ref Rng & Units 06/29/2018 02/01/2018 12/18/2017  Glucose 70 - 99 mg/dL 158(H) 131(H) 162(H)  BUN 8 - 23 mg/dL - 39(H) -  Creatinine 0.44 - 1.00 mg/dL - 3.28(H) -  Sodium 135 - 145 mmol/L 138 138 137  Potassium 3.5 - 5.1 mmol/L 4.0 5.1 4.7  Chloride 98 - 111 mmol/L - 101 -  CO2 22 - 32 mmol/L - 27 -  Calcium 8.9 - 10.3 mg/dL - 9.9 -  Total Protein 6.0 - 8.3 g/dL - - -  Total Bilirubin 0.3 - 1.2 mg/dL - - -  Alkaline Phos 39 - 117 U/L - - -  AST 0 - 37 U/L - - -  ALT 0 - 35 U/L - - -   CBC Latest Ref Rng & Units 06/29/2018 12/18/2017 01/08/2014  WBC 4.0 - 10.5 K/uL - - 7.8  Hemoglobin 12.0 - 15.0 g/dL 9.9(L) 10.2(L) 10.7(L)  Hematocrit 36.0 - 46.0 % 29.0(L) 30.0(L) 33.5(L)  Platelets 150 - 400 K/uL  - - 211   Lipid Panel     Component Value Date/Time   CHOL  07/23/2009 0419    84        ATP III CLASSIFICATION:  <200     mg/dL   Desirable  200-239  mg/dL   Borderline High  >=240    mg/dL   High          TRIG 44 07/23/2009 0419   HDL 52 07/23/2009 0419   CHOLHDL 1.6 07/23/2009 0419   VLDL 9 07/23/2009 0419   LDLCALC  07/23/2009 0419    23        Total Cholesterol/HDL:CHD Risk Coronary Heart Disease Risk Table                     Men   Women  1/2 Average Risk   3.4   3.3  Average Risk       5.0   4.4  2 X Average Risk   9.6   7.1  3 X Average Risk  23.4   11.0        Use the calculated Patient Ratio above and the CHD Risk Table to determine the patient's CHD Risk.        ATP III CLASSIFICATION (LDL):  <100     mg/dL   Optimal  100-129  mg/dL   Near or Above                    Optimal  130-159  mg/dL   Borderline  160-189  mg/dL   High  >190     mg/dL   Very High   HEMOGLOBIN A1C Lab Results  Component Value Date   HGBA1C 6.7 (H) 01/07/2014   MPG 146 (H) 01/07/2014   TSH No results  for input(s): TSH in the last 8760 hours.  PRN Meds:. Medications Discontinued During This Encounter  Medication Reason  . insulin NPH-regular Human (NOVOLIN 70/30) (70-30) 100 UNIT/ML injection Error  . cloNIDine (CATAPRES) 0.1 MG tablet Discontinued by provider  . NITROSTAT 0.4 MG SL tablet Reorder   Current Meds  Medication Sig  . acetaminophen (TYLENOL) 325 MG tablet Take 650 mg by mouth every 6 (six) hours as needed (pain/headaches.).  Marland Kitchen allopurinol (ZYLOPRIM) 100 MG tablet Take 50 mg by mouth daily.  Marland Kitchen amLODipine (NORVASC) 10 MG tablet Take 10 mg by mouth daily.  Marland Kitchen aspirin EC 81 MG tablet Take 81 mg by mouth daily.  . B-D INS SYR ULTRAFINE 1CC/30G 30G X 1/2" 1 ML MISC Inject 1 each into the skin 3 (three) times daily between meals.  . calcitRIOL (ROCALTROL) 0.25 MCG capsule Take 0.5 mcg by mouth daily.  . calcium carbonate (OSCAL) 1500 (600 Ca) MG TABS tablet Take 600 mg  by mouth daily.  . Ferrous Sulfate (IRON) 142 (45 Fe) MG TBCR Take 142 mg by mouth 2 (two) times daily.  . folic acid (FOLVITE) 1 MG tablet Take 1 mg by mouth daily.  Marland Kitchen HUMALOG KWIKPEN 100 UNIT/ML KwikPen 4-6 Units by Subconjunctival route 3 (three) times daily before meals. Sliding Scale Insulin  . insulin glargine (LANTUS) 100 UNIT/ML injection Inject 12 Units into the skin at bedtime.   Marland Kitchen labetalol (NORMODYNE) 300 MG tablet Take 1 tablet by mouth twice daily (Patient taking differently: Take 300 mg by mouth 2 (two) times daily. )  . leflunomide (ARAVA) 10 MG tablet Take 10 mg by mouth daily.  Marland Kitchen lisinopril (ZESTRIL) 5 MG tablet Take 5 mg by mouth daily.  . Magnesium 250 MG TABS Take 250 mg by mouth daily.  . niacin (NIASPAN) 500 MG CR tablet Take 500 mg by mouth daily.   . nitroGLYCERIN (NITROSTAT) 0.4 MG SL tablet Place 1 tablet (0.4 mg total) under the tongue every 5 (five) minutes x 3 doses as needed for chest pain.  . nortriptyline (PAMELOR) 10 MG capsule Take 10 mg by mouth daily.  . ONE TOUCH ULTRA TEST test strip 1 each by Other route 3 (three) times daily between meals.  Marland Kitchen oxyCODONE-acetaminophen (PERCOCET) 5-325 MG tablet Take 1 tablet by mouth every 6 (six) hours as needed for severe pain.  . polyvinyl alcohol (TEARS AGAIN) 1.4 % ophthalmic solution Place 1 drop into both eyes 3 (three) times daily as needed for dry eyes.  Marland Kitchen PRISTIQ 100 MG 24 hr tablet Take 100 mg by mouth daily.   . rosuvastatin (CRESTOR) 20 MG tablet Take 20 mg by mouth daily.  Marland Kitchen torsemide (DEMADEX) 20 MG tablet Take 20 mg by mouth daily.  . [DISCONTINUED] NITROSTAT 0.4 MG SL tablet Place 0.4 mg under the tongue every 5 (five) minutes x 3 doses as needed for chest pain.     Cardiac Studies:   Coronary angio 01/07/2014: Stenting distal RCA with implantation of a 2.5 x 14 mm resolute integrity DES. Mild disease in Cx and LAD.  Carotid artery duplex 12/04/2013: No evidence of hemodynamically significant stenosis  in the bilateral carotid bifurcation vessels. There is evidence of minimal heterogeneous plaque in the bilateral carotid artery.  Echo- 01/14/2016 1. Left ventricle cavity is normal in size. Mild concentric hypertrophy of the left ventricle. Normal global wall motion. Calculated EF 63%. 2. Left atrial cavity is mildly dilated. 3. Mild (Grade I) aortic regurgitation. Mild calcification of the aortic valve  annulus. Mild aortic valve leaflet calcification. Mildly restricted aortic valve leaflets. Mild aortic valve stenosis with peak gradient 36, mean gradient 20 mm of Hg. Calculated valve area is 1.07 cm2. 4. Mild (Grade I) mitral regurgitation. 5. Mild tricuspid regurgitation. Mild pulmonary hypertension with approx. PA syst. pressure of 36 mm of Hg. 6. c.f. echo. of 01/14/2015, mild pulmonary HTN is new, no other diagnostic change.  Assessment:     ICD-10-CM   1. Coronary artery disease involving native coronary artery of native heart with angina pectoris (HCC)  I25.119 EKG 12-Lead    PCV MYOCARDIAL PERFUSION WITH LEXISCAN    PCV ECHOCARDIOGRAM COMPLETE  2. Dyspnea on exertion  R06.09 EKG 12-Lead    PCV MYOCARDIAL PERFUSION WITH LEXISCAN    PCV ECHOCARDIOGRAM COMPLETE  3. Bradycardia  R00.1   4. CKD (chronic kidney disease) stage 4, GFR 15-29 ml/min (HCC)  N18.4   5. Essential hypertension  I10   6. Chronic iron deficiency anemia  D50.9     EKG 07/16/2018: Sinus bradycardia at 56 bpm, normal axis, poor R wave progression, cannot exclude anterior infarct old. T wave inversion/flattening in inferolateral leads, cannot exclude ischemia. Compared to EKG in Jan 2019, T wave abnormality in lateral leads not as prominent.   Recommendations:   Over the last 2 weeks, patient has had symptoms that are concerning for angina.  She does have history of iron deficiency anemia likely related to chronic kidney disease, but states her iron levels have actually improved from before.  I have given  her prescription for nitroglycerin as her current prescription is expired.  Do feel that she would benefit from [facility] nuclear stress testing for further evaluation.  Previously echocardiogram in 2017 suggested slight increase in PA pressures compared to previous echocardiogram, will repeat this for surveillance.  She has chronic bradycardia that has been stable, do not feel that this is contributing to her symptoms.  Her blood pressure is well controlled, diastolic is actually low.  Clonidine was recently discontinued by her nephrologist.  I have asked her to monitor at home and to notify me if continues to be low and will consider making medication changes at that time.  No acute EKG changes are noted today.  We will see her back in 4 weeks for follow-up on her symptoms to discuss test results, but encouraged sooner follow-up if needed.  Miquel Dunn, MSN, APRN, FNP-C [facility]. [place] Office: [phone] Fax: [phone]

## 2018-07-17 ENCOUNTER — Other Ambulatory Visit: Payer: Self-pay

## 2018-07-17 ENCOUNTER — Ambulatory Visit (INDEPENDENT_AMBULATORY_CARE_PROVIDER_SITE_OTHER): Payer: Self-pay | Admitting: Physician Assistant

## 2018-07-17 VITALS — BP 129/58 | Temp 97.9°F | Resp 12 | Ht 62.0 in | Wt 169.6 lb

## 2018-07-17 DIAGNOSIS — N184 Chronic kidney disease, stage 4 (severe): Secondary | ICD-10-CM

## 2018-07-20 ENCOUNTER — Other Ambulatory Visit: Payer: Self-pay | Admitting: Cardiology

## 2018-07-20 ENCOUNTER — Encounter: Payer: Self-pay | Admitting: Cardiology

## 2018-07-20 MED ORDER — NITROGLYCERIN 0.4 MG SL SUBL: 0.4000 mg | SUBLINGUAL_TABLET | SUBLINGUAL | 1 refills | Status: DC | PRN

## 2018-08-06 ENCOUNTER — Other Ambulatory Visit: Payer: Self-pay

## 2018-08-06 ENCOUNTER — Ambulatory Visit (INDEPENDENT_AMBULATORY_CARE_PROVIDER_SITE_OTHER)

## 2018-08-06 DIAGNOSIS — R0609 Other forms of dyspnea: Secondary | ICD-10-CM | POA: Diagnosis not present

## 2018-08-06 DIAGNOSIS — I25119 Atherosclerotic heart disease of native coronary artery with unspecified angina pectoris: Secondary | ICD-10-CM

## 2018-08-06 DIAGNOSIS — R0600 Dyspnea, unspecified: Secondary | ICD-10-CM

## 2018-08-23 ENCOUNTER — Ambulatory Visit (INDEPENDENT_AMBULATORY_CARE_PROVIDER_SITE_OTHER)

## 2018-08-23 ENCOUNTER — Other Ambulatory Visit: Payer: Self-pay

## 2018-08-23 DIAGNOSIS — R0600 Dyspnea, unspecified: Secondary | ICD-10-CM

## 2018-08-23 DIAGNOSIS — R0609 Other forms of dyspnea: Secondary | ICD-10-CM | POA: Diagnosis not present

## 2018-08-23 DIAGNOSIS — I25119 Atherosclerotic heart disease of native coronary artery with unspecified angina pectoris: Secondary | ICD-10-CM

## 2018-08-27 ENCOUNTER — Ambulatory Visit (INDEPENDENT_AMBULATORY_CARE_PROVIDER_SITE_OTHER): Admitting: Cardiology

## 2018-08-27 ENCOUNTER — Encounter: Payer: Self-pay | Admitting: Cardiology

## 2018-08-27 ENCOUNTER — Other Ambulatory Visit: Payer: Self-pay

## 2018-08-27 VITALS — BP 162/62 | HR 60 | Ht 62.0 in | Wt 170.0 lb

## 2018-08-27 DIAGNOSIS — I25119 Atherosclerotic heart disease of native coronary artery with unspecified angina pectoris: Secondary | ICD-10-CM

## 2018-08-27 DIAGNOSIS — N184 Chronic kidney disease, stage 4 (severe): Secondary | ICD-10-CM | POA: Diagnosis not present

## 2018-08-27 DIAGNOSIS — I080 Rheumatic disorders of both mitral and aortic valves: Secondary | ICD-10-CM

## 2018-08-27 DIAGNOSIS — I129 Hypertensive chronic kidney disease with stage 1 through stage 4 chronic kidney disease, or unspecified chronic kidney disease: Secondary | ICD-10-CM

## 2018-08-27 NOTE — Progress Notes (Addendum)
Primary Physician:  Merrilee Seashore, MD   Patient ID: [name], female    DOB: August 26, 1940, 78 y.o.   MRN: [id number]  Subjective:    Chief Complaint  Patient presents with  . Coronary Artery Disease  . Follow-up    HPI: HALIMAH BRAFFORD  is a 78 y.o. female  with history of hypertension, hyperlipidemia, h/o CVA, diabetes with stage III to IV chronic kidney disease (follows Dr. Jeneen Rinks Deterding), mild aortic stenosis and CAD. She also has asymptomatic marked S. Bradycardia.  She had coronary angiogram on 01/07/2014 with implantation of a 2.5 x 14 mm resolute integrity DES to the distal RCA. Mild disease noted in the Cx and LAD.   She underwent placement of left brachiocephalic fistula. She was last seen a few weeks ago and had reported symptoms of exertional chest discomfort and dyspnea with just walking down to her mailbox. Symptoms were concerning for angina. She underwent echocardiogram and nuclear stress testing and now presents for follow up.   Today, she reports that her symptoms have improved and actually resolved. States that she is now able to do household chores and walk to her mailbox without any dyspnea or chest pain. No new complaints today.  States that her blood pressure has been well controlled. She is to see her Nephrologist later this month, CKD is stage 4, but has not yet required dialysis.   Past Medical History:  Diagnosis Date  . Anginal pain ([place])   . Arthritis   . Cerebrovascular disease   . CKD (chronic kidney disease)    Sees Dr Deterding  . Coronary artery disease   . Depression   . Diabetes ([place])    INSULIN DEPENDENT  . Diabetic peripheral neuropathy ([place])   . Diabetic retinopathy ([place])   . Diverticulitis   . Dyslipidemia   . GERD (gastroesophageal reflux disease)   . Headache(784.0) 01/30/2013  . Heart murmur   . History of cerebrovascular accident 01/30/2013  . Hyperlipidemia   . Hypertension   . Migraine   . Obesity   . Stroke ([place])     x 2 no residulal  . TIA (transient ischemic attack)    " MULTIPLE PER PATIENT "; had left sided weakness- rehab and wants without assistance    Past Surgical History:  Procedure Laterality Date  . ABDOMINAL HYSTERECTOMY    . AV FISTULA PLACEMENT Left 12/18/2017   Procedure: ARTERIOVENOUS (AV) FISTULA CREATION ARM;  Surgeon: Marty Heck, MD;  Location: [facility];  Service: Vascular;  Laterality: Left;  . bilateral cataract surgery    . CARDIAC CATHETERIZATION  01/07/2014   DR Spindale RESECTION  02/1999  . COLONOSCOPY    . COLOSTOMY  02/1999  . COLOSTOMY CLOSURE  07/1999  . EYE SURGERY Left 2019  . FISTULA SUPERFICIALIZATION Left 06/29/2018   Procedure: FISTULA SUPERFICIALIZATION LEFT BRACHIOCEPHALIC;  Surgeon: Marty Heck, MD;  Location: [facility];  Service: Vascular;  Laterality: Left;  . LEFT HEART CATHETERIZATION WITH CORONARY ANGIOGRAM N/A 01/07/2014   Procedure: LEFT HEART CATHETERIZATION WITH CORONARY ANGIOGRAM;  Surgeon: Laverda Page, MD;  Location[facility] C[facility] LAB;  Service: Cardiovascular;  Laterality: N/A;  . middle cerebral artery stent placement Right   . OTHER SURGICAL HISTORY     laser surgery  . PTCA  01/07/2014   DES to RCA    DR Einar Gip  . right knee surgery Right    for infection    Social History   Socioeconomic  History  . Marital status: Widowed    Spouse name: Not on file  . Number of children: 2  . Years of education: PHD  . Highest education level: Not on file  Occupational History  . Not on file  Social Needs  . Financial resource strain: Not on file  . Food insecurity    Worry: Not on file    Inability: Not on file  . Transportation needs    Medical: Not on file    Non-medical: Not on file  Tobacco Use  . Smoking status: Never Smoker  . Smokeless tobacco: Never Used  Substance and Sexual Activity  . Alcohol use: No  . Drug use: No  . Sexual activity: Not on file  Lifestyle  . Physical activity    Days per week: Not on  file    Minutes per session: Not on file  . Stress: Not on file  Relationships  . Social Herbalist on phone: Not on file    Gets together: Not on file    Attends religious service: Not on file    Active member of club or organization: Not on file    Attends meetings of clubs or organizations: Not on file    Relationship status: Not on file  . Intimate partner violence    Fear of current or ex partner: Not on file    Emotionally abused: Not on file    Physically abused: Not on file    Forced sexual activity: Not on file  Other Topics Concern  . Not on file  Social History Narrative   Patient is widowed and lives with her two adopted children.   Patient is retired.   Patient has two children of her own and two adopted children.   Patient has a PHD.   Patient is right-handed.    Review of Systems  Constitution: Negative for decreased appetite, malaise/fatigue, weight gain and weight loss.  Eyes: Negative for visual disturbance.  Cardiovascular: Negative for chest pain, claudication, dyspnea on exertion, leg swelling, orthopnea, palpitations and syncope.  Respiratory: Negative for hemoptysis and wheezing.   Endocrine: Negative for cold intolerance and heat intolerance.  Hematologic/Lymphatic: Does not bruise/bleed easily.  Skin: Negative for nail changes.  Musculoskeletal: Negative for muscle weakness and myalgias.  Gastrointestinal: Negative for abdominal pain, change in bowel habit, nausea and vomiting.  Neurological: Negative for difficulty with concentration, dizziness, focal weakness and headaches.  Psychiatric/Behavioral: Negative for altered mental status and suicidal ideas.  All other systems reviewed and are negative.     Objective:  Blood pressure (!) 162/62, pulse 60, height 5\' 2"  (1.575 m), weight 170 lb (77.1 kg), SpO2 97 %. Body mass index is 31.09 kg/m.    Physical Exam  Constitutional: She is oriented to person, place, and time. Vital signs are  normal. She appears well-developed and well-nourished.  Mildly obese  HENT:  Head: Normocephalic and atraumatic.  Neck: Normal range of motion.  Cardiovascular: Normal rate, regular rhythm and intact distal pulses.  Murmur heard.  Midsystolic murmur is present with a grade of 3/6 at the upper right sternal border radiating to the neck. Pulses:      Carotid pulses are on the right side with bruit and on the left side with bruit.      Femoral pulses are 2+ on the right side and 2+ on the left side.      Popliteal pulses are 1+ on the right side and 1+ on the  left side.       Dorsalis pedis pulses are 2+ on the right side and 2+ on the left side.       Posterior tibial pulses are 2+ on the right side and 2+ on the left side.  Left upper arm AV fistula present  Pulmonary/Chest: Effort normal and breath sounds normal. No accessory muscle usage. No respiratory distress.  Abdominal: Soft. Bowel sounds are normal.  Musculoskeletal: Normal range of motion.  Neurological: She is alert and oriented to person, place, and time.  Skin: Skin is warm and dry.  Vitals reviewed.  Radiology: No results found.  Laboratory examination:    CMP Latest Ref Rng & Units 06/29/2018 02/01/2018 12/18/2017  Glucose 70 - 99 mg/dL 158(H) 131(H) 162(H)  BUN 8 - 23 mg/dL - 39(H) -  Creatinine 0.44 - 1.00 mg/dL - 3.28(H) -  Sodium 135 - 145 mmol/L 138 138 137  Potassium 3.5 - 5.1 mmol/L 4.0 5.1 4.7  Chloride 98 - 111 mmol/L - 101 -  CO2 22 - 32 mmol/L - 27 -  Calcium 8.9 - 10.3 mg/dL - 9.9 -  Total Protein 6.0 - 8.3 g/dL - - -  Total Bilirubin 0.3 - 1.2 mg/dL - - -  Alkaline Phos 39 - 117 U/L - - -  AST 0 - 37 U/L - - -  ALT 0 - 35 U/L - - -   CBC Latest Ref Rng & Units 06/29/2018 12/18/2017 01/08/2014  WBC 4.0 - 10.5 K/uL - - 7.8  Hemoglobin 12.0 - 15.0 g/dL 9.9(L) 10.2(L) 10.7(L)  Hematocrit 36.0 - 46.0 % 29.0(L) 30.0(L) 33.5(L)  Platelets 150 - 400 K/uL - - 211   Lipid Panel     Component Value  Date/Time   CHOL  07/23/2009 0419    84        ATP III CLASSIFICATION:  <200     mg/dL   Desirable  200-239  mg/dL   Borderline High  >=240    mg/dL   High          TRIG 44 07/23/2009 0419   HDL 52 07/23/2009 0419   CHOLHDL 1.6 07/23/2009 0419   VLDL 9 07/23/2009 0419   LDLCALC  07/23/2009 0419    23        Total Cholesterol/HDL:CHD Risk Coronary Heart Disease Risk Table                     Men   Women  1/2 Average Risk   3.4   3.3  Average Risk       5.0   4.4  2 X Average Risk   9.6   7.1  3 X Average Risk  23.4   11.0        Use the calculated Patient Ratio above and the CHD Risk Table to determine the patient's CHD Risk.        ATP III CLASSIFICATION (LDL):  <100     mg/dL   Optimal  100-129  mg/dL   Near or Above                    Optimal  130-159  mg/dL   Borderline  160-189  mg/dL   High  >190     mg/dL   Very High   HEMOGLOBIN A1C Lab Results  Component Value Date   HGBA1C 6.7 (H) 01/07/2014   MPG 146 (H) 01/07/2014   TSH No results for input(s): TSH  in the last 8760 hours.  PRN Meds:. There are no discontinued medications. Current Meds  Medication Sig  . acetaminophen (TYLENOL) 325 MG tablet Take 650 mg by mouth every 6 (six) hours as needed (pain/headaches.).  Marland Kitchen allopurinol (ZYLOPRIM) 100 MG tablet Take 50 mg by mouth daily.  Marland Kitchen amLODipine (NORVASC) 10 MG tablet Take 10 mg by mouth daily.  Marland Kitchen aspirin EC 81 MG tablet Take 81 mg by mouth daily.  . calcitRIOL (ROCALTROL) 0.25 MCG capsule Take 0.5 mcg by mouth daily.  . calcium carbonate (OSCAL) 1500 (600 Ca) MG TABS tablet Take 600 mg by mouth daily.  . Ferrous Sulfate (IRON) 142 (45 Fe) MG TBCR Take 142 mg by mouth 2 (two) times daily.  . folic acid (FOLVITE) 1 MG tablet Take 1 mg by mouth daily.  Marland Kitchen HUMALOG KWIKPEN 100 UNIT/ML KwikPen 4-6 Units by Subconjunctival route 3 (three) times daily before meals. Sliding Scale Insulin  . insulin glargine (LANTUS) 100 UNIT/ML injection Inject 12 Units into the  skin at bedtime.   Marland Kitchen labetalol (NORMODYNE) 300 MG tablet Take 1 tablet by mouth twice daily (Patient taking differently: Take 300 mg by mouth 2 (two) times daily. )  . leflunomide (ARAVA) 10 MG tablet Take 10 mg by mouth daily.  Marland Kitchen lisinopril (ZESTRIL) 5 MG tablet Take 5 mg by mouth daily.  . Magnesium 250 MG TABS Take 250 mg by mouth daily.  . niacin (NIASPAN) 500 MG CR tablet Take 500 mg by mouth daily.   . nitroGLYCERIN (NITROSTAT) 0.4 MG SL tablet Place 1 tablet (0.4 mg total) under the tongue every 5 (five) minutes x 3 doses as needed for chest pain.  . nortriptyline (PAMELOR) 10 MG capsule Take 10 mg by mouth daily.  Marland Kitchen omeprazole (PRILOSEC) 40 MG capsule Take 40 mg by mouth daily.  Marland Kitchen oxyCODONE-acetaminophen (PERCOCET) 5-325 MG tablet Take 1 tablet by mouth every 6 (six) hours as needed for severe pain.  . polyvinyl alcohol (TEARS AGAIN) 1.4 % ophthalmic solution Place 1 drop into both eyes 3 (three) times daily as needed for dry eyes.  Marland Kitchen PRISTIQ 100 MG 24 hr tablet Take 100 mg by mouth daily.   . rosuvastatin (CRESTOR) 20 MG tablet Take 20 mg by mouth daily.  Marland Kitchen torsemide (DEMADEX) 20 MG tablet Take 20 mg by mouth daily.    Cardiac Studies:   Echo 08/23/2018:  1. Normal LV systolic function with EF 55%. Left ventricle cavity is normal in size. Mild concentric hypertrophy of the left ventricle. Normal global wall motion. 2. Left atrial cavity is mildly dilated. 3. Trileaflet aortic valve. Moderate aortic annular and leaflet calcification. Severe aortic valve stenosis. Aortic valve mean gradient of 40 mmHg, Vmax of 4.0 m/s. Calculated aortic valve area by continuity equation is 0.7 cm. Moderate (Grade III) aortic regurgitation. 4. Moderate (Grade II) mitral regurgitation. 5. Moderate tricuspid regurgitation. Moderate pulmonary hypertension. Estimated pulmonary artery systolic pressure is 50 mmHg. 6. Compared to previous study in 2017, there is interval increase in severity of  aortic stenosis, mitral and tricuspid regurgitation, pulmonary hypertension. Aortic regurgitation is new.  [place] Stress Test 08/06/2018: Stress EKG is non-diagnostic, as this is pharmacological stress test. Myocardial perfusion imaging is normal. Left ventricular ejection fraction is  50% with normal wall motion. Low risk study.  Coronary angio 01/07/2014: Stenting distal RCA with implantation of a 2.5 x 14 mm resolute integrity DES. Mild disease in Cx and LAD.  Carotid artery duplex 12/04/2013: No evidence of hemodynamically significant  stenosis in the bilateral carotid bifurcation vessels. There is evidence of minimal heterogeneous plaque in the bilateral carotid artery.   Assessment:     ICD-10-CM   1. Aortic stenosis with mitral and aortic insufficiency  I08.0   2. Coronary artery disease involving native coronary artery of native heart with angina pectoris ([place])  I25.119   3. CKD (chronic kidney disease) stage 4, GFR 15-29 ml/min (HCC)  N18.4     EKG 07/16/2018: Sinus bradycardia at 56 bpm, normal axis, poor R wave progression, cannot exclude anterior infarct old. T wave inversion/flattening in inferolateral leads, cannot exclude ischemia. Compared to EKG in Jan 2019, T wave abnormality in lateral leads not as prominent.   Recommendations:   Discussed recently obtained stress test results with patient, no perfusion noted.  Considered low risk study.  She has had progression of aortic stenosis, previously mild in 2017 and is now severe.  Also has progression of pulmonary hypertension.  Over the last few weeks she has had improvement and resolution of her symptoms of angina.  She will need coronary angiogram; however she is not currently on dialysis and if we were to proceed with procedure, this will likely put her on to dialysis.  She is to see nephrology later this month for follow-up.  Once she is on dialysis, we could proceed with this.  As her symptoms have improved and  essentially resolved and has normal LVEF, feel that we can continue with watchful waiting for now.  She was educated on concerning symptoms that she should urgently notify me.  No clinical evidence of heart failure.  Her blood pressure is elevated today, but has generally been well controlled.  She states that she did not take her medications today.  This will be reevaluated with her nephrologist in the next few weeks.  No changes were made medications today.  I will plan to see her back in 2 months for close monitoring, but again encouraged her to contact me sooner if needed.   *I have discussed this case with Dr. Einar Gip and he participated in formulating the plan.*   Miquel Dunn, MSN, APRN, FNP-C [facility]. [place] Office: [phone] Fax: [phone]

## 2018-08-29 ENCOUNTER — Encounter: Payer: Self-pay | Admitting: Cardiology

## 2018-08-31 ENCOUNTER — Telehealth: Payer: Self-pay

## 2018-08-31 NOTE — Telephone Encounter (Signed)
8/26 [facility] ,Dr. Jeneen Rinks  Deterding Who is retiring and she is scheduled to see someone new

## 2018-08-31 NOTE — Telephone Encounter (Signed)
Pt called in since last night SOB and coughing, also a wave of dizziness.  BP  is 153 61 pulse 66

## 2018-09-03 NOTE — Telephone Encounter (Signed)
Dr. Upton@  [facility]

## 2018-09-03 NOTE — Telephone Encounter (Signed)
Can you please call and check on her? Ask if she can find out who she will be seeing so that we can send them our office visit note

## 2018-09-04 NOTE — Telephone Encounter (Signed)
Okay, I will send a message. We need to move up her appt with [name] for in the next 1-2 weeks. Maybe next thursday after she sees them or sooner if she is still feeling bad.

## 2018-09-05 NOTE — Telephone Encounter (Signed)
She will come in next Tuesday  09/11/18, I told her to call if she wasn't feeling good.

## 2018-09-06 ENCOUNTER — Other Ambulatory Visit: Payer: Self-pay | Admitting: Cardiology

## 2018-09-09 ENCOUNTER — Emergency Department (HOSPITAL_COMMUNITY): Payer: Medicare Other

## 2018-09-09 ENCOUNTER — Other Ambulatory Visit: Payer: Self-pay

## 2018-09-09 ENCOUNTER — Encounter (HOSPITAL_COMMUNITY): Payer: Self-pay | Admitting: Emergency Medicine

## 2018-09-09 ENCOUNTER — Inpatient Hospital Stay (HOSPITAL_COMMUNITY)
Admission: EM | Admit: 2018-09-09 | Discharge: 2018-09-12 | DRG: 064 | Disposition: A | Discharge status: Home / Self Care | Payer: Medicare Other | Attending: Student | Admitting: Student

## 2018-09-09 DIAGNOSIS — J9601 Acute respiratory failure with hypoxia: Secondary | ICD-10-CM | POA: Diagnosis present

## 2018-09-09 DIAGNOSIS — N184 Chronic kidney disease, stage 4 (severe): Secondary | ICD-10-CM | POA: Diagnosis not present

## 2018-09-09 DIAGNOSIS — M069 Rheumatoid arthritis, unspecified: Secondary | ICD-10-CM | POA: Diagnosis present

## 2018-09-09 DIAGNOSIS — R202 Paresthesia of skin: Secondary | ICD-10-CM | POA: Diagnosis not present

## 2018-09-09 DIAGNOSIS — Z66 Do not resuscitate: Secondary | ICD-10-CM | POA: Diagnosis present

## 2018-09-09 DIAGNOSIS — Z833 Family history of diabetes mellitus: Secondary | ICD-10-CM | POA: Diagnosis not present

## 2018-09-09 DIAGNOSIS — Z794 Long term (current) use of insulin: Secondary | ICD-10-CM | POA: Diagnosis not present

## 2018-09-09 DIAGNOSIS — E669 Obesity, unspecified: Secondary | ICD-10-CM | POA: Diagnosis present

## 2018-09-09 DIAGNOSIS — E785 Hyperlipidemia, unspecified: Secondary | ICD-10-CM | POA: Diagnosis present

## 2018-09-09 DIAGNOSIS — E1122 Type 2 diabetes mellitus with diabetic chronic kidney disease: Secondary | ICD-10-CM | POA: Diagnosis present

## 2018-09-09 DIAGNOSIS — I352 Nonrheumatic aortic (valve) stenosis with insufficiency: Secondary | ICD-10-CM | POA: Diagnosis present

## 2018-09-09 DIAGNOSIS — I639 Cerebral infarction, unspecified: Secondary | ICD-10-CM | POA: Diagnosis not present

## 2018-09-09 DIAGNOSIS — E1159 Type 2 diabetes mellitus with other circulatory complications: Secondary | ICD-10-CM | POA: Diagnosis not present

## 2018-09-09 DIAGNOSIS — Z8249 Family history of ischemic heart disease and other diseases of the circulatory system: Secondary | ICD-10-CM

## 2018-09-09 DIAGNOSIS — I132 Hypertensive heart and chronic kidney disease with heart failure and with stage 5 chronic kidney disease, or end stage renal disease: Secondary | ICD-10-CM | POA: Diagnosis present

## 2018-09-09 DIAGNOSIS — Z8739 Personal history of other diseases of the musculoskeletal system and connective tissue: Secondary | ICD-10-CM | POA: Diagnosis not present

## 2018-09-09 DIAGNOSIS — K219 Gastro-esophageal reflux disease without esophagitis: Secondary | ICD-10-CM | POA: Diagnosis present

## 2018-09-09 DIAGNOSIS — H53462 Homonymous bilateral field defects, left side: Secondary | ICD-10-CM | POA: Diagnosis present

## 2018-09-09 DIAGNOSIS — H539 Unspecified visual disturbance: Secondary | ICD-10-CM

## 2018-09-09 DIAGNOSIS — F329 Major depressive disorder, single episode, unspecified: Secondary | ICD-10-CM | POA: Diagnosis present

## 2018-09-09 DIAGNOSIS — R29702 NIHSS score 2: Secondary | ICD-10-CM | POA: Diagnosis present

## 2018-09-09 DIAGNOSIS — I5031 Acute diastolic (congestive) heart failure: Secondary | ICD-10-CM | POA: Diagnosis not present

## 2018-09-09 DIAGNOSIS — N2581 Secondary hyperparathyroidism of renal origin: Secondary | ICD-10-CM | POA: Diagnosis present

## 2018-09-09 DIAGNOSIS — Z6831 Body mass index (BMI) 31.0-31.9, adult: Secondary | ICD-10-CM

## 2018-09-09 DIAGNOSIS — N183 Chronic kidney disease, stage 3 (moderate): Secondary | ICD-10-CM | POA: Diagnosis not present

## 2018-09-09 DIAGNOSIS — Z20828 Contact with and (suspected) exposure to other viral communicable diseases: Secondary | ICD-10-CM | POA: Diagnosis present

## 2018-09-09 DIAGNOSIS — I340 Nonrheumatic mitral (valve) insufficiency: Secondary | ICD-10-CM | POA: Diagnosis not present

## 2018-09-09 DIAGNOSIS — I63431 Cerebral infarction due to embolism of right posterior cerebral artery: Secondary | ICD-10-CM

## 2018-09-09 DIAGNOSIS — Z7982 Long term (current) use of aspirin: Secondary | ICD-10-CM

## 2018-09-09 DIAGNOSIS — G8929 Other chronic pain: Secondary | ICD-10-CM | POA: Diagnosis present

## 2018-09-09 DIAGNOSIS — E1165 Type 2 diabetes mellitus with hyperglycemia: Secondary | ICD-10-CM | POA: Diagnosis not present

## 2018-09-09 DIAGNOSIS — E1142 Type 2 diabetes mellitus with diabetic polyneuropathy: Secondary | ICD-10-CM | POA: Diagnosis present

## 2018-09-09 DIAGNOSIS — I2510 Atherosclerotic heart disease of native coronary artery without angina pectoris: Secondary | ICD-10-CM

## 2018-09-09 DIAGNOSIS — E11319 Type 2 diabetes mellitus with unspecified diabetic retinopathy without macular edema: Secondary | ICD-10-CM | POA: Diagnosis present

## 2018-09-09 DIAGNOSIS — N185 Chronic kidney disease, stage 5: Secondary | ICD-10-CM | POA: Diagnosis present

## 2018-09-09 DIAGNOSIS — E0840 Diabetes mellitus due to underlying condition with diabetic neuropathy, unspecified: Secondary | ICD-10-CM

## 2018-09-09 DIAGNOSIS — I361 Nonrheumatic tricuspid (valve) insufficiency: Secondary | ICD-10-CM | POA: Diagnosis not present

## 2018-09-09 DIAGNOSIS — Z79899 Other long term (current) drug therapy: Secondary | ICD-10-CM

## 2018-09-09 DIAGNOSIS — I5033 Acute on chronic diastolic (congestive) heart failure: Secondary | ICD-10-CM | POA: Diagnosis present

## 2018-09-09 DIAGNOSIS — H21562 Pupillary abnormality, left eye: Secondary | ICD-10-CM | POA: Diagnosis not present

## 2018-09-09 DIAGNOSIS — R0602 Shortness of breath: Secondary | ICD-10-CM

## 2018-09-09 DIAGNOSIS — Z8673 Personal history of transient ischemic attack (TIA), and cerebral infarction without residual deficits: Secondary | ICD-10-CM | POA: Diagnosis not present

## 2018-09-09 DIAGNOSIS — I350 Nonrheumatic aortic (valve) stenosis: Secondary | ICD-10-CM | POA: Diagnosis not present

## 2018-09-09 DIAGNOSIS — I10 Essential (primary) hypertension: Secondary | ICD-10-CM | POA: Diagnosis not present

## 2018-09-09 LAB — DIFFERENTIAL
Abs Immature Granulocytes: 0.03 10*3/uL (ref 0.00–0.07)
Basophils Absolute: 0 10*3/uL (ref 0.0–0.1)
Basophils Relative: 1 %
Eosinophils Absolute: 0.2 10*3/uL (ref 0.0–0.5)
Eosinophils Relative: 2 %
Immature Granulocytes: 0 %
Lymphocytes Relative: 15 %
Lymphs Abs: 1.3 10*3/uL (ref 0.7–4.0)
Monocytes Absolute: 0.6 10*3/uL (ref 0.1–1.0)
Monocytes Relative: 7 %
Neutro Abs: 6.5 10*3/uL (ref 1.7–7.7)
Neutrophils Relative %: 75 %

## 2018-09-09 LAB — CBC
HCT: 33.7 % — ABNORMAL LOW (ref 36.0–46.0)
Hemoglobin: 10.7 g/dL — ABNORMAL LOW (ref 12.0–15.0)
MCH: 30 pg (ref 26.0–34.0)
MCHC: 31.8 g/dL (ref 30.0–36.0)
MCV: 94.4 fL (ref 80.0–100.0)
Platelets: 176 10*3/uL (ref 150–400)
RBC: 3.57 MIL/uL — ABNORMAL LOW (ref 3.87–5.11)
RDW: 13.6 % (ref 11.5–15.5)
WBC: 8.7 10*3/uL (ref 4.0–10.5)
nRBC: 0 % (ref 0.0–0.2)

## 2018-09-09 LAB — COMPREHENSIVE METABOLIC PANEL
ALT: 19 U/L (ref 0–44)
AST: 25 U/L (ref 15–41)
Albumin: 3.5 g/dL (ref 3.5–5.0)
Alkaline Phosphatase: 72 U/L (ref 38–126)
Anion gap: 13 (ref 5–15)
BUN: 39 mg/dL — ABNORMAL HIGH (ref 8–23)
CO2: 25 mmol/L (ref 22–32)
Calcium: 10.1 mg/dL (ref 8.9–10.3)
Chloride: 100 mmol/L (ref 98–111)
Creatinine, Ser: 3.72 mg/dL — ABNORMAL HIGH (ref 0.44–1.00)
GFR calc Af Amer: 13 mL/min — ABNORMAL LOW (ref >60)
GFR calc non Af Amer: 11 mL/min — ABNORMAL LOW (ref >60)
Glucose, Bld: 194 mg/dL — ABNORMAL HIGH (ref 70–99)
Potassium: 4.4 mmol/L (ref 3.5–5.1)
Sodium: 138 mmol/L (ref 135–145)
Total Bilirubin: 0.7 mg/dL (ref 0.3–1.2)
Total Protein: 6.8 g/dL (ref 6.5–8.1)

## 2018-09-09 LAB — I-STAT CHEM 8, ED
BUN: 47 mg/dL — ABNORMAL HIGH (ref 8–23)
Calcium, Ion: 1.13 mmol/L — ABNORMAL LOW (ref 1.15–1.40)
Chloride: 101 mmol/L (ref 98–111)
Creatinine, Ser: 3.8 mg/dL — ABNORMAL HIGH (ref 0.44–1.00)
Glucose, Bld: 187 mg/dL — ABNORMAL HIGH (ref 70–99)
HCT: 33 % — ABNORMAL LOW (ref 36.0–46.0)
Hemoglobin: 11.2 g/dL — ABNORMAL LOW (ref 12.0–15.0)
Potassium: 4.5 mmol/L (ref 3.5–5.1)
Sodium: 134 mmol/L — ABNORMAL LOW (ref 135–145)
TCO2: 28 mmol/L (ref 22–32)

## 2018-09-09 LAB — APTT: aPTT: 20 seconds — ABNORMAL LOW (ref 24–36)

## 2018-09-09 LAB — PROTIME-INR
INR: 1.1 (ref 0.8–1.2)
Prothrombin Time: 13.6 seconds (ref 11.4–15.2)

## 2018-09-09 LAB — CBG MONITORING, ED
Glucose-Capillary: 184 mg/dL — ABNORMAL HIGH (ref 70–99)
Glucose-Capillary: 123 mg/dL — ABNORMAL HIGH (ref 70–99)

## 2018-09-09 LAB — SEDIMENTATION RATE: Sed Rate: 25 mm/hr — ABNORMAL HIGH (ref 0–22)

## 2018-09-09 MED ORDER — INSULIN GLARGINE 100 UNIT/ML ~~LOC~~ SOLN
12.0000 [IU] | Freq: Every day | SUBCUTANEOUS | Status: DC
  Administered 2018-09-09 – 2018-09-11 (×3): 12 [IU] via SUBCUTANEOUS
  Filled 2018-09-09 (×5): qty 0.12

## 2018-09-09 MED ORDER — PANTOPRAZOLE SODIUM 40 MG PO TBEC
40.0000 mg | DELAYED_RELEASE_TABLET | Freq: Every day | ORAL | Status: DC
  Administered 2018-09-10 – 2018-09-12 (×3): 40 mg via ORAL
  Filled 2018-09-09 (×3): qty 1

## 2018-09-09 MED ORDER — CALCITRIOL 0.5 MCG PO CAPS
0.5000 ug | ORAL_CAPSULE | Freq: Every day | ORAL | Status: DC
  Administered 2018-09-10 – 2018-09-12 (×3): 0.5 ug via ORAL
  Filled 2018-09-09: qty 2
  Filled 2018-09-09 (×2): qty 1
  Filled 2018-09-09: qty 2
  Filled 2018-09-09: qty 1
  Filled 2018-09-09: qty 2

## 2018-09-09 MED ORDER — LORAZEPAM 1 MG PO TABS: 1.0000 mg | ORAL_TABLET | Freq: Once | ORAL | Status: DC | PRN

## 2018-09-09 MED ORDER — HYDRALAZINE HCL 20 MG/ML IJ SOLN: 10.0000 mg | Freq: Four times a day (QID) | INTRAMUSCULAR | Status: DC | PRN

## 2018-09-09 MED ORDER — ROSUVASTATIN CALCIUM 20 MG PO TABS: 20.0000 mg | ORAL_TABLET | Freq: Every day | ORAL | Status: DC

## 2018-09-09 MED ORDER — MAGNESIUM OXIDE 400 (241.3 MG) MG PO TABS
200.0000 mg | ORAL_TABLET | Freq: Every day | ORAL | Status: DC
  Administered 2018-09-10 – 2018-09-12 (×3): 200 mg via ORAL
  Filled 2018-09-09 (×3): qty 1

## 2018-09-09 MED ORDER — CALCIUM CARBONATE 1500 (600 CA) MG PO TABS: 600.0000 mg | ORAL_TABLET | Freq: Every day | ORAL | Status: DC

## 2018-09-09 MED ORDER — ACETAMINOPHEN 160 MG/5ML PO SOLN: 650.0000 mg | ORAL | Status: DC | PRN

## 2018-09-09 MED ORDER — ACETAMINOPHEN 650 MG RE SUPP: 650.0000 mg | RECTAL | Status: DC | PRN

## 2018-09-09 MED ORDER — NIACIN ER (ANTIHYPERLIPIDEMIC) 500 MG PO TBCR
500.0000 mg | EXTENDED_RELEASE_TABLET | Freq: Every day | ORAL | Status: DC
  Administered 2018-09-10 – 2018-09-12 (×3): 500 mg via ORAL
  Filled 2018-09-09 (×3): qty 1

## 2018-09-09 MED ORDER — INSULIN ASPART 100 UNIT/ML ~~LOC~~ SOLN
0.0000 [IU] | Freq: Three times a day (TID) | SUBCUTANEOUS | Status: DC
  Administered 2018-09-10 (×2): 2 [IU] via SUBCUTANEOUS
  Administered 2018-09-11: 1 [IU] via SUBCUTANEOUS
  Administered 2018-09-11 – 2018-09-12 (×2): 2 [IU] via SUBCUTANEOUS

## 2018-09-09 MED ORDER — SODIUM CHLORIDE 0.9 % IV BOLUS
500.0000 mL | Freq: Once | INTRAVENOUS | Status: AC
  Administered 2018-09-09: 500 mL via INTRAVENOUS

## 2018-09-09 MED ORDER — OXYCODONE-ACETAMINOPHEN 5-325 MG PO TABS
1.0000 | ORAL_TABLET | Freq: Four times a day (QID) | ORAL | Status: DC | PRN
  Filled 2018-09-09: qty 1

## 2018-09-09 MED ORDER — HEPARIN SODIUM (PORCINE) 5000 UNIT/ML IJ SOLN
5000.0000 [IU] | Freq: Three times a day (TID) | INTRAMUSCULAR | Status: DC
  Administered 2018-09-09 – 2018-09-12 (×8): 5000 [IU] via SUBCUTANEOUS
  Filled 2018-09-09 (×9): qty 1

## 2018-09-09 MED ORDER — FENTANYL CITRATE (PF) 100 MCG/2ML IJ SOLN
50.0000 ug | Freq: Once | INTRAMUSCULAR | Status: AC
  Administered 2018-09-09: 17:00:00 50 ug via INTRAVENOUS
  Filled 2018-09-09: qty 2

## 2018-09-09 MED ORDER — SENNOSIDES-DOCUSATE SODIUM 8.6-50 MG PO TABS
1.0000 | ORAL_TABLET | Freq: Every evening | ORAL | Status: DC | PRN
  Administered 2018-09-11: 1 via ORAL
  Filled 2018-09-09: qty 1

## 2018-09-09 MED ORDER — ACETAMINOPHEN 325 MG PO TABS
650.0000 mg | ORAL_TABLET | ORAL | Status: DC | PRN
  Administered 2018-09-10: 650 mg via ORAL
  Filled 2018-09-09: qty 2

## 2018-09-09 MED ORDER — SODIUM CHLORIDE 0.9% FLUSH: 3.0000 mL | Freq: Once | INTRAVENOUS | Status: DC

## 2018-09-09 MED ORDER — FOLIC ACID 1 MG PO TABS
1.0000 mg | ORAL_TABLET | Freq: Every day | ORAL | Status: DC
  Administered 2018-09-10 – 2018-09-12 (×3): 1 mg via ORAL
  Filled 2018-09-09 (×3): qty 1

## 2018-09-09 MED ORDER — FERROUS SULFATE 325 (65 FE) MG PO TABS
325.0000 mg | ORAL_TABLET | Freq: Two times a day (BID) | ORAL | Status: DC
  Administered 2018-09-10 – 2018-09-12 (×5): 325 mg via ORAL
  Filled 2018-09-09 (×7): qty 1

## 2018-09-09 MED ORDER — NORTRIPTYLINE HCL 10 MG PO CAPS
10.0000 mg | ORAL_CAPSULE | Freq: Every day | ORAL | Status: DC
  Administered 2018-09-10 – 2018-09-11 (×2): 10 mg via ORAL
  Filled 2018-09-09 (×3): qty 1

## 2018-09-09 MED ORDER — STROKE: EARLY STAGES OF RECOVERY BOOK
Freq: Once | Status: DC
  Filled 2018-09-09: qty 1

## 2018-09-09 MED ORDER — LEFLUNOMIDE 20 MG PO TABS
10.0000 mg | ORAL_TABLET | Freq: Every day | ORAL | Status: DC
  Administered 2018-09-10 – 2018-09-12 (×3): 10 mg via ORAL
  Filled 2018-09-09 (×3): qty 0.5

## 2018-09-09 MED ORDER — ALLOPURINOL 100 MG PO TABS
50.0000 mg | ORAL_TABLET | Freq: Every day | ORAL | Status: DC
  Administered 2018-09-10 – 2018-09-12 (×3): 50 mg via ORAL
  Filled 2018-09-09 (×3): qty 1

## 2018-09-09 MED ORDER — TORSEMIDE 20 MG PO TABS
20.0000 mg | ORAL_TABLET | Freq: Every day | ORAL | Status: DC
  Administered 2018-09-10 – 2018-09-12 (×3): 20 mg via ORAL
  Filled 2018-09-09 (×3): qty 1

## 2018-09-09 MED ORDER — VENLAFAXINE HCL ER 75 MG PO CP24
150.0000 mg | ORAL_CAPSULE | Freq: Every day | ORAL | Status: DC
  Administered 2018-09-10 – 2018-09-12 (×3): 150 mg via ORAL
  Filled 2018-09-09 (×2): qty 2
  Filled 2018-09-09: qty 1

## 2018-09-09 NOTE — H&P (Addendum)
History and Physical    [name] [id number] DOB: 1940-10-02 DOA: 09/09/2018  PCP: Merrilee Seashore, MD Patient coming from: Home  Chief Complaint: Vision loss  HPI: [name] is a 78 y.o. female with medical history significant of previous stroke, depression, diabetes, peripheral neuropathy, CAD, migraine.  Patient presented secondary to symptoms of vision loss in addition to significant posterior headache.  Patient states that she woke up this morning and noticed that she had vision loss when she tried to look at a picture and was only able to see half of the picture.  She had associated severe headache that she describes as throbbing and located posterior in addition to radiating down the right temporal area into the back of her eye. Patient took some oxycodone which did not help with her symptoms. Laying down did not help her headache.  Later in the day, patient checked her blood pressure which was abnormal with a low diastolic number in the [id number] to 40s and she proceeded to call her brother to take her to the hospital for evaluation.  ED Course: Vitals: afebrile, normal pulse, normal respirations, BP of 150/50, on room air Labs: Glucose of 194, BUN of 39, Creatinine of 3.72, hemoglobin of 10.7 Imaging: CT head significant for acute/subacute right PCA territory infarct affecting right occipital lobe Medications/Course: Fentanyl IV  Review of Systems: Review of Systems  Constitutional: Negative for chills and fever.  Eyes: Negative for blurred vision.  Respiratory: Positive for shortness of breath (chronic). Negative for cough.   Cardiovascular: Negative for chest pain and palpitations.  Gastrointestinal: Negative for abdominal pain, blood in stool, constipation, diarrhea, nausea and vomiting.  Neurological: Positive for tingling and headaches.    Past Medical History:  Diagnosis Date   Anginal pain ([place])    Arthritis    Cerebrovascular disease    CKD (chronic  kidney disease)    Sees Dr Deterding   Coronary artery disease    Depression    Diabetes ([place])    INSULIN DEPENDENT   Diabetic peripheral neuropathy ([place])    Diabetic retinopathy ([place])    Diverticulitis    Dyslipidemia    GERD (gastroesophageal reflux disease)    Headache(784.0) 01/30/2013   Heart murmur    History of cerebrovascular accident 01/30/2013   Hyperlipidemia    Hypertension    Migraine    Obesity    Stroke ([place])    x 2 no residulal   TIA (transient ischemic attack)    " MULTIPLE PER PATIENT "; had left sided weakness- rehab and wants without assistance    Past Surgical History:  Procedure Laterality Date   ABDOMINAL HYSTERECTOMY     AV FISTULA PLACEMENT Left 12/18/2017   Procedure: ARTERIOVENOUS (AV) FISTULA CREATION ARM;  Surgeon: Marty Heck, MD;  Location: [facility];  Service: Vascular;  Laterality: Left;   bilateral cataract surgery     CARDIAC CATHETERIZATION  01/07/2014   DR Einar Gip   COLON RESECTION  02/1999   COLONOSCOPY     COLOSTOMY  02/1999   COLOSTOMY CLOSURE  07/1999   EYE SURGERY Left 2019   FISTULA SUPERFICIALIZATION Left 06/29/2018   Procedure: FISTULA SUPERFICIALIZATION LEFT BRACHIOCEPHALIC;  Surgeon: Marty Heck, MD;  Location: [facility];  Service: Vascular;  Laterality: Left;   LEFT HEART CATHETERIZATION WITH CORONARY ANGIOGRAM N/A 01/07/2014   Procedure: LEFT HEART CATHETERIZATION WITH CORONARY ANGIOGRAM;  Surgeon: Laverda Page, MD;  Location: [facility] CATH LAB;  Service: Cardiovascular;  Laterality: N/A;  middle cerebral artery stent placement Right    OTHER SURGICAL HISTORY     laser surgery   PTCA  01/07/2014   DES to RCA    DR Einar Gip   right knee surgery Right    for infection     reports that she has never smoked. She has never used smokeless tobacco. She reports that she does not drink alcohol or use drugs.  No Known Allergies  Family History  Problem Relation Age of Onset   Diabetes  Mother    Heart disease Mother    Prostate cancer Father    Hypertension Brother    Prostate cancer Brother    Prior to Admission medications   Medication Sig Start Date End Date Taking? Authorizing Provider  acetaminophen (TYLENOL) 325 MG tablet Take 650 mg by mouth every 6 (six) hours as needed (pain/headaches.).   Yes [provider]  allopurinol (ZYLOPRIM) 100 MG tablet Take 50 mg by mouth daily.   Yes [provider]  amLODipine (NORVASC) 10 MG tablet Take 10 mg by mouth daily.   Yes [provider]  aspirin EC 81 MG tablet Take 81 mg by mouth daily.   Yes [provider]  B-D INS SYR ULTRAFINE 1CC/30G 30G X 1/2" 1 ML MISC Inject 1 each into the skin 3 (three) times daily between meals. 12/18/12  Yes [provider]  calcitRIOL (ROCALTROL) 0.25 MCG capsule Take 0.5 mcg by mouth daily.   Yes [provider]  calcium carbonate (OSCAL) 1500 (600 Ca) MG TABS tablet Take 600 mg by mouth daily.   Yes [provider]  Ferrous Sulfate (IRON) 142 (45 Fe) MG TBCR Take 142 mg by mouth 2 (two) times daily.   Yes [provider]  folic acid (FOLVITE) 1 MG tablet Take 1 mg by mouth daily. 12/07/12  Yes [provider]  HUMALOG KWIKPEN 100 UNIT/ML KwikPen 4-6 Units by Subconjunctival route 3 (three) times daily before meals. Sliding Scale Insulin 05/28/18  Yes [provider]  insulin glargine (LANTUS) 100 UNIT/ML injection Inject 12 Units into the skin at bedtime.    Yes [provider]  labetalol (NORMODYNE) 300 MG tablet Take 1 tablet by mouth twice daily Patient taking differently: Take 300 mg by mouth 2 (two) times daily.  06/25/18  Yes Adrian Prows, MD  leflunomide (ARAVA) 10 MG tablet Take 10 mg by mouth daily.   Yes [provider]  lisinopril (ZESTRIL) 5 MG tablet Take 5 mg by mouth daily.   Yes [provider]  Magnesium 250 MG TABS Take 250 mg by mouth daily.   Yes [provider]  niacin (NIASPAN) 500 MG CR tablet Take 500 mg by mouth daily.  01/29/18  Yes [provider]  nortriptyline (PAMELOR) 10 MG capsule Take 10 mg by mouth daily. 12/17/12  Yes [provider]  omeprazole (PRILOSEC) 40 MG capsule Take 40 mg by mouth daily.   Yes [provider]  ONE TOUCH ULTRA TEST test strip 1 each by Other route 3 (three) times daily between meals. 12/18/12  Yes [provider]  oxyCODONE-acetaminophen (PERCOCET) 5-325 MG tablet Take 1 tablet by mouth every 6 (six) hours as needed for severe pain. 06/29/18  Yes Rhyne, Hulen Shouts, PA-C  polyvinyl alcohol (TEARS AGAIN) 1.4 % ophthalmic solution Place 1 drop into both eyes 3 (three) times daily as needed for dry eyes.   Yes [provider]  PRISTIQ 100 MG 24 hr tablet Take  100 mg by mouth daily.  12/29/12  Yes [provider]  rosuvastatin (CRESTOR) 20 MG tablet Take 20 mg by mouth daily.   Yes [provider]  torsemide (DEMADEX) 20 MG tablet Take 20 mg by mouth daily. 12/29/12  Yes [provider]  nitroGLYCERIN (NITROSTAT) 0.4 MG SL tablet PLACE 1 TABLET (0.4 MG TOTAL) UNDER THE TONGUE EVERY 5 MINUTES X 3 DOSES AS NEEDED FOR CHEST PAIN. Patient not taking: No sig reported 09/06/18   Adrian Prows, MD    Physical Exam:  Physical Exam Constitutional:      General: She is not in acute distress.    Appearance: She is well-developed. She is not diaphoretic.  Eyes:     Conjunctiva/sclera: Conjunctivae normal.     Pupils: Pupils are equal, round, and reactive to light.  Neck:     Musculoskeletal: Normal range of motion.  Cardiovascular:     Rate and Rhythm: Normal rate and regular rhythm.     Heart sounds: Murmur present. Crescendo  systolic murmur present with a grade of 3/6.  Pulmonary:     Effort: Pulmonary effort is normal. No respiratory distress.     Breath sounds: Normal breath sounds. No wheezing or rales.  Abdominal:     General: Bowel  sounds are normal. There is no distension.     Palpations: Abdomen is soft.     Tenderness: There is no abdominal tenderness. There is no guarding or rebound.  Musculoskeletal: Normal range of motion.        General: No tenderness.  Lymphadenopathy:     Cervical: No cervical adenopathy.  Skin:    General: Skin is warm and dry.  Neurological:     Mental Status: She is alert and oriented to person, place, and time.     Comments: Left visual field deficit      Labs on Admission: I have personally reviewed following labs and imaging studies  CBC: Recent Labs  Lab 09/09/18 1639 09/09/18 1655  WBC 8.7  --   NEUTROABS 6.5  --   HGB 10.7* 11.2*  HCT 33.7* 33.0*  MCV 94.4  --   PLT 176  --     Basic Metabolic Panel: Recent Labs  Lab 09/09/18 1639 09/09/18 1655  NA 138 134*  K 4.4 4.5  CL 100 101  CO2 25  --   GLUCOSE 194* 187*  BUN 39* 47*  CREATININE 3.72* 3.80*  CALCIUM 10.1  --     GFR: Estimated Creatinine Clearance: 11.5 mL/min (A) (by C-G formula based on SCr of 3.8 mg/dL (H)).  Liver Function Tests: Recent Labs  Lab 09/09/18 1639  AST 25  ALT 19  ALKPHOS 72  BILITOT 0.7  PROT 6.8  ALBUMIN 3.5   No results for input(s): LIPASE, AMYLASE in the last 168 hours. No results for input(s): AMMONIA in the last 168 hours.  Coagulation Profile: Recent Labs  Lab 09/09/18 1639  INR 1.1    Cardiac Enzymes: No results for input(s): CKTOTAL, CKMB, CKMBINDEX, TROPONINI in the last 168 hours.  BNP (last 3 results) No results for input(s): PROBNP in the last 8760 hours.  HbA1C: No results for input(s): HGBA1C in the last 72 hours.  CBG: Recent Labs  Lab 09/09/18 1641  GLUCAP 184*    Lipid Profile: No results for input(s): CHOL, HDL, LDLCALC, TRIG, CHOLHDL, LDLDIRECT in the last 72 hours.  Thyroid Function Tests: No results for input(s): TSH, T4TOTAL, FREET4, T3FREE, THYROIDAB in the last 72  hours.  Anemia Panel: No results for input(s):  VITAMINB12, FOLATE, FERRITIN, TIBC, IRON, RETICCTPCT in the last 72 hours.  Urine analysis:    Component Value Date/Time   COLORURINE YELLOW 07/23/2009 0528   APPEARANCEUR CLEAR 07/23/2009 0528   LABSPEC 1.013 07/23/2009 0528   PHURINE 6.0 07/23/2009 0528   GLUCOSEU NEGATIVE 07/23/2009 0528   HGBUR NEGATIVE 07/23/2009 0528   BILIRUBINUR NEGATIVE 07/23/2009 0528   KETONESUR NEGATIVE 07/23/2009 0528   PROTEINUR NEGATIVE 07/23/2009 0528   UROBILINOGEN 0.2 07/23/2009 0528   NITRITE NEGATIVE 07/23/2009 0528   LEUKOCYTESUR SMALL (A) 07/23/2009 0528     Radiological Exams on Admission: Ct Head Wo Contrast  Result Date: 09/09/2018 CLINICAL DATA:  78 year old female with severe headache onset this morning and loss of vision. EXAM: CT HEAD WITHOUT CONTRAST TECHNIQUE: Contiguous axial images were obtained from the base of the skull through the vertex without intravenous contrast. COMPARISON:  Brain MRI 07/17/2010. Head CT 07/22/2009. FINDINGS: Brain: Chronic encephalomalacia in the posterosuperior right frontal lobe appears not significantly changed since 2012. However, there is a 3 centimeter area of hypodensity most compatible with cytotoxic edema in the right occipital lobe on series 3, image 12. this area was normal in 2012. No associated hemorrhage. No significant mass effect. Elsewhere gray-white matter differentiation is within normal limits. No midline shift, ventriculomegaly, mass effect, evidence of mass lesion. Vascular: Calcified atherosclerosis at the skull base. There is also a chronic right MCA M1 stent vascular stent visible on series 6, image 24. No suspicious intracranial vascular hyperdensity. Skull: No acute osseous abnormality identified. Sinuses/Orbits: Visualized paranasal sinuses and mastoids are stable and well pneumatized. Other: Postoperative changes to both globes. No acute orbit or scalp soft tissue finding. IMPRESSION: 1. Positive for acute to subacute Right PCA territory  infarct affecting the right occipital lobe. No associated hemorrhage or mass effect. 2. No other acute intracranial abnormality. Chronic posterior right MCA territory infarct and right M1 vascular stent. Electronically Signed   By: Genevie Ann [name]   On: 09/09/2018 17:02    EKG: Independently reviewed. Inferior/lateral lead t-wave changes which   Assessment/Plan Active Problems:   Stroke ([place])   Acute stroke Previous history of stroke. Current right PICA territory occipital stroke. Neurology on board.  -Neurology recs. Aspirin 325, MRI brain, MRA head w/o contrast, carotid dopplers; SBP goal <180 -Lipid panel, hemoglobin A1C -Hydralazine IV prn SBP >180 -PT, OT, SLP -Transthoracic Echocardiogram  Headache Possibly secondary to stroke per discussion with ED (who discussed with neuro). Resolved currently.  CKD stage IV-V Secondary hyperparathyroidism Patient follows with [facility]. Creatinine appears to be stable. -Continue Calcitriol, [place], torsemide  Diabetes mellitus, type 2 Diabetic neuropathy Patient is on insulin therapy as an outpatient -Continue Lantus -SSI -Recommend changing directions for home Humalog so it does not read to "by subconjunctival" with regard to route of administration  GERD -Protonix  Hyperlipidemia CAD Essential hypertension Patient follows with Dr. Einar Gip as an outpatient. On lisinopril, labetalol, amlodipine -Continue Crestor -Hold outpatient antihypertensives; manage with IV hydralazine  Depression -Continue Pristiq (Effexor as substitute) and nortriptyline  Aortic stenosis Murmur consistent with diagnosis heard on exam. -Transthoracic Echocardiogram   DVT prophylaxis: Heparin subq Code Status: DNR Family Communication: Brother at bedside Disposition Plan: Telemetry Consults called: Neurology Admission status: Inpatient   Cordelia Poche, MD [facility] 09/09/2018, 6:00 PM

## 2018-09-09 NOTE — ED Notes (Signed)
Note to MD to notify that pt failed swallow screen and to request IV medications instead of POs.

## 2018-09-09 NOTE — ED Notes (Signed)
Patient transported to CT 

## 2018-09-09 NOTE — Consult Note (Addendum)
NEURO HOSPITALIST  CONSULT   Requesting Physician: Dr. Melina Copa    Chief Complaint: HA  History obtained from:  Patient     HPI:                                                                                                                                         [name] is an 78 y.o. female  With PMH TIA, CVA x 2 ( no residual deficits), HTN, HLD, CKD, CAD, DM who presented to [facility] ED with c/ o HA and vision loss.   Patient woke up this morning in her usual state of health at about 0415. She got out of the bed about 0445 and was still normal. She did her normal morning routine ate and then took a nap. States that when she woke up about 0700 she had a bad throbbing HA on the right side of her head and felt a lot of pressure behind her right eye. She stated that she could not see the lrightside of her daughter who was standing in front of her. This vision loss lasted until she got to the hospital about 1600. Patient takes a daily ASA 81 mg enies missing any doses. Denies ETOH, drug abuse or tobacco use. Denies any numbness, tingling or focal weakness.   ED course:  CTH: no hemorrhage, Right PCA infarct, chronic posterior R MCA infarct and right m1 stent.  01/2013: per note Dr. Jannifer Franklin ( neurology): prior history of right frontal stroke, M1 stenosis with stent. At that time was taking aggrenox.   Date last known well: 09/06/2018 Time last known well:0445 tPA Given: no; outside of window         Modified Rankin: Rankin Score=1 NIHSS:1: field cut   Past Medical History:  Diagnosis Date  . Anginal pain ([place])   . Arthritis   . Cerebrovascular disease   . CKD (chronic kidney disease)    Sees Dr Deterding  . Coronary artery disease   . Depression   . Diabetes ([place])    INSULIN DEPENDENT  . Diabetic peripheral neuropathy ([place])   . Diabetic retinopathy ([place])   . Diverticulitis   . Dyslipidemia   . GERD (gastroesophageal reflux disease)    . Headache(784.0) 01/30/2013  . Heart murmur   . History of cerebrovascular accident 01/30/2013  . Hyperlipidemia   . Hypertension   . Migraine   . Obesity   . Stroke ([place])    x 2 no residulal  . TIA (transient ischemic attack)    " MULTIPLE PER PATIENT "; had left sided weakness- rehab and wants without assistance  Past Surgical History:  Procedure Laterality Date  . ABDOMINAL HYSTERECTOMY    . AV FISTULA PLACEMENT Left 12/18/2017   Procedure: ARTERIOVENOUS (AV) FISTULA CREATION ARM;  Surgeon: Marty Heck, MD;  Location: [facility];  Service: Vascular;  Laterality: Left;  . bilateral cataract surgery    . CARDIAC CATHETERIZATION  01/07/2014   DR [place] RESECTION  02/1999  . COLONOSCOPY    . COLOSTOMY  02/1999  . COLOSTOMY CLOSURE  07/1999  . EYE SURGERY Left 2019  . FISTULA SUPERFICIALIZATION Left 06/29/2018   Procedure: FISTULA SUPERFICIALIZATION LEFT BRACHIOCEPHALIC;  Surgeon: Marty Heck, MD;  Location: [facility];  Service: Vascular;  Laterality: Left;  . LEFT HEART CATHETERIZATION WITH CORONARY ANGIOGRAM N/A 01/07/2014   Procedure: LEFT HEART CATHETERIZATION WITH CORONARY ANGIOGRAM;  Surgeon: Laverda Page, MD;  Location: [facility] CATH LAB;  Service: Cardiovascular;  Laterality: N/A;  . middle cerebral artery stent placement Right   . OTHER SURGICAL HISTORY     laser surgery  . PTCA  01/07/2014   DES to RCA    DR Einar Gip  . right knee surgery Right    for infection    Family History  Problem Relation Age of Onset  . Diabetes Mother   . Heart disease Mother   . Prostate cancer Father   . Hypertension Brother   . Prostate cancer Brother          Social History:  reports that she has never smoked. She has never used smokeless tobacco. She reports that she does not drink alcohol or use drugs.  Allergies: No Known Allergies  Medications:                                                                                                                           Current Facility-Administered Medications  Medication Dose Route Frequency Provider Last Rate Last Dose  . sodium chloride flush (NS) 0.9 % injection 3 mL  3 mL Intravenous Once Hayden Rasmussen, MD       Current Outpatient Medications  Medication Sig Dispense Refill  . acetaminophen (TYLENOL) 325 MG tablet Take 650 mg by mouth every 6 (six) hours as needed (pain/headaches.).    Marland Kitchen allopurinol (ZYLOPRIM) 100 MG tablet Take 50 mg by mouth daily.    Marland Kitchen amLODipine (NORVASC) 10 MG tablet Take 10 mg by mouth daily.    Marland Kitchen aspirin EC 81 MG tablet Take 81 mg by mouth daily.    . B-D INS SYR ULTRAFINE 1CC/30G 30G X 1/2" 1 ML MISC Inject 1 each into the skin 3 (three) times daily between meals.    . calcitRIOL (ROCALTROL) 0.25 MCG capsule Take 0.5 mcg by mouth daily.    . calcium carbonate (OSCAL) 1500 (600 Ca) MG TABS tablet Take 600 mg by mouth daily.    . Ferrous Sulfate (IRON) 142 (45 Fe) MG TBCR Take 142 mg by mouth 2 (two) times daily.    . folic  acid (FOLVITE) 1 MG tablet Take 1 mg by mouth daily.    Marland Kitchen HUMALOG KWIKPEN 100 UNIT/ML KwikPen 4-6 Units by Subconjunctival route 3 (three) times daily before meals. Sliding Scale Insulin    . insulin glargine (LANTUS) 100 UNIT/ML injection Inject 12 Units into the skin at bedtime.     Marland Kitchen labetalol (NORMODYNE) 300 MG tablet Take 1 tablet by mouth twice daily (Patient taking differently: Take 300 mg by mouth 2 (two) times daily. ) 60 tablet 11  . leflunomide (ARAVA) 10 MG tablet Take 10 mg by mouth daily.    Marland Kitchen lisinopril (ZESTRIL) 5 MG tablet Take 5 mg by mouth daily.    . Magnesium 250 MG TABS Take 250 mg by mouth daily.    . niacin (NIASPAN) 500 MG CR tablet Take 500 mg by mouth daily.     . nortriptyline (PAMELOR) 10 MG capsule Take 10 mg by mouth daily.    Marland Kitchen omeprazole (PRILOSEC) 40 MG capsule Take 40 mg by mouth daily.    . ONE TOUCH ULTRA TEST test strip 1 each by Other route 3 (three) times daily between meals.    Marland Kitchen oxyCODONE-acetaminophen  (PERCOCET) 5-325 MG tablet Take 1 tablet by mouth every 6 (six) hours as needed for severe pain. 12 tablet 0  . polyvinyl alcohol (TEARS AGAIN) 1.4 % ophthalmic solution Place 1 drop into both eyes 3 (three) times daily as needed for dry eyes.    Marland Kitchen PRISTIQ 100 MG 24 hr tablet Take 100 mg by mouth daily.     . rosuvastatin (CRESTOR) 20 MG tablet Take 20 mg by mouth daily.    Marland Kitchen torsemide (DEMADEX) 20 MG tablet Take 20 mg by mouth daily.    . nitroGLYCERIN (NITROSTAT) 0.4 MG SL tablet PLACE 1 TABLET (0.4 MG TOTAL) UNDER THE TONGUE EVERY 5 MINUTES X 3 DOSES AS NEEDED FOR CHEST PAIN. (Patient not taking: No sig reported) 25 tablet 1     ROS:                                                                                                                                       ROS was performed and is negative except as noted in HPI    General Examination:                                                                                                      Blood pressure (!) 150/47, pulse 69, temperature 97.6 F (36.4 C),  temperature source Oral, resp. rate (!) 22, SpO2 94 %.  HEENT-  Normocephalic, no lesions, without obvious abnormality.  Normal external eye and conjunctiva. Cardiovascular- S1-S2 audible, pulses palpable throughout  Lungs-.  Saturations within normal limits on RA. Patient working to breath. Abdomen- All 4 quadrants palpated and non tender Extremities- Warm, dry and intact Musculoskeletal-no joint tenderness, deformity or swelling Skin-warm and dry, no hyperpigmentation, vitiligo, or suspicious lesions  Neurological Examination Mental Status: Alert, oriented, thought content appropriate.  Naming intact. Speech fluent without evidence of aphasia.  Able to follow  commands without difficulty. Cranial Nerves: II:  Left homonymous hemaniopsia III,IV, VI: ptosis not present, extra-ocular motions intact bilaterally, right pupil is round and reactive to light, left pupil is oval and  does not react to light ( previous eye surgery).  V,VII: smile symmetric, facial light touch sensation normal bilaterally VIII: hearing normal bilaterally IX,X: uvula rises midline XI: bilateral shoulder shrug XII: midline tongue extension Motor: Right : Upper extremity   5/5  Left:     Upper extremity   5/5  Lower extremity   5/5   Lower extremity   5/5 Tone and bulk:normal tone throughout; no atrophy noted Sensory:light touch intact throughout, bilaterally Deep Tendon Reflexes: 2+ and symmetric biceps and patella Plantars: Right: downgoing   Left: downgoing Cerebellar: normal Helmes-to-nose,  normal heel-to-shin test Gait: deferred   Lab Results: Basic Metabolic Panel: Recent Labs  Lab 09/09/18 1639 09/09/18 1655  NA 138 134*  K 4.4 4.5  CL 100 101  CO2 25  --   GLUCOSE 194* 187*  BUN 39* 47*  CREATININE 3.72* 3.80*  CALCIUM 10.1  --     CBC: Recent Labs  Lab 09/09/18 1639 09/09/18 1655  WBC 8.7  --   NEUTROABS 6.5  --   HGB 10.7* 11.2*  HCT 33.7* 33.0*  MCV 94.4  --   PLT 176  --     CBG: Recent Labs  Lab 09/09/18 1641  GLUCAP 184*    Imaging: Ct Head Wo Contrast  Result Date: 09/09/2018 CLINICAL DATA:  78 year old female with severe headache onset this morning and loss of vision. EXAM: CT HEAD WITHOUT CONTRAST TECHNIQUE: Contiguous axial images were obtained from the base of the skull through the vertex without intravenous contrast. COMPARISON:  Brain MRI 07/17/2010. Head CT 07/22/2009. FINDINGS: Brain: Chronic encephalomalacia in the posterosuperior right frontal lobe appears not significantly changed since 2012. However, there is a 3 centimeter area of hypodensity most compatible with cytotoxic edema in the right occipital lobe on series 3, image 12. this area was normal in 2012. No associated hemorrhage. No significant mass effect. Elsewhere gray-white matter differentiation is within normal limits. No midline shift, ventriculomegaly, mass effect,  evidence of mass lesion. Vascular: Calcified atherosclerosis at the skull base. There is also a chronic right MCA M1 stent vascular stent visible on series 6, image 24. No suspicious intracranial vascular hyperdensity. Skull: No acute osseous abnormality identified. Sinuses/Orbits: Visualized paranasal sinuses and mastoids are stable and well pneumatized. Other: Postoperative changes to both globes. No acute orbit or scalp soft tissue finding. IMPRESSION: 1. Positive for acute to subacute Right PCA territory infarct affecting the right occipital lobe. No associated hemorrhage or mass effect. 2. No other acute intracranial abnormality. Chronic posterior right MCA territory infarct and right M1 vascular stent. Electronically Signed   By: Genevie Ann [name]   On: 09/09/2018 17:02       Laurey Morale, MSN, NP-C [facility] [phone]  09/09/2018,  5:58 PM   Attending physician note to follow with Assessment and plan .   Assessment: 78 y.o. female female  With PMH TIA, CVA x 2 ( no residual deficits), HTN, HLD, CKD, CAD, DM who presented to [facility] ED with c/ o HA and vision loss. CTH: no hemorrhage, right PCA infarct. TPA not give d/t patient resenting outside the window.  Mri: pending. Admit for complete stroke work-up.   Stroke Risk Factors - diabetes mellitus, hyperlipidemia and hypertension  Recommendations: -- BP goal : Permissive HTN upto 220/110 mmHg --MRI Brain  --MRA of the head w/o and carotid doppler --Echocardiogram -- ASA 325mg   -- High intensity Statin  -- HgbA1c, fasting lipid panel -- PT consult, OT consult, Speech consult --Telemetry monitoring --Frequent neuro checks --Stroke swallow screen     --please page stroke NP  Or  PA  Or MD from 8am -4 pm  as this patient from this time will be  followed by the stroke.   You can look them up on [email or web address]  Password [email or web address]   NEUROHOSPITALIST ADDENDUM Performed a face to face diagnostic evaluation.   I have reviewed  the contents of history and physical exam as documented by PA/ARNP/Resident and agree with above documentation.  I have discussed and formulated the above plan as documented. Edits to the note have been made as needed.  Patient presenting with acute vision loss to left-sided visual fields consistent with right PCA infarct.  Likely embolic: Either atheroembolic versus cardioembolic.  Patient needs intracranial and neck imaging.  Echocardiogram and telemetry to monitor for atrial fibrillation.  If if above work-up does not give an explanation, recommend discharge on telemetry.  Start patient on aspirin, moderate-sized infarct.  Does not meet criteria for 3 weeks of dual antiplatelets per CHANCE and POINT trial, however patient has intracranial atherosclerotic disease then I would recommend 3 months of dual antiplatelets.   Karena Addison Ranay Ketter MD [facility] [id number]   If 7pm to 7am, please call on call as listed on AMION.

## 2018-09-09 NOTE — ED Provider Notes (Signed)
[facility] Provider Note   CSN: [id number] Arrival date & time: 09/09/18  1514     History   Chief Complaint Chief Complaint  Patient presents with  . Headache    HPI [name] is a 78 y.o. female.  She said she experienced a severe headache this morning around 7 AM that was 10 out of 10 intensity.  She said she took some Tylenol with minimal improvement.  She has some chronic eye problems and is supposed to get surgery on her right eye.  She said she was having field defect where she could not see half of the image in front of her.  This was new also since 7 AM this morning.  She says that a little bit better.  No numbness no weakness no difficulty with balance no difficulty with speech.  She said she usually does not get headaches.  She said her doctor has been working on keeping her diastolic blood pressure above 60 and it has been low on and off and it was low again today.     The history is provided by the patient.  Headache Pain location:  Generalized Quality:  Unable to specify Radiates to:  Does not radiate Severity currently:  8/10 Severity at highest:  10/10 Onset quality:  Sudden Duration:  10 hours Timing:  Constant Progression:  Improving Chronicity:  New Context comment:  At rest Relieved by:  Nothing Worsened by:  Nothing Ineffective treatments:  Acetaminophen Associated symptoms: blurred vision and cough   Associated symptoms: no abdominal pain, no diarrhea, no ear pain, no eye pain, no fever, no focal weakness, no hearing loss, no nausea, no neck pain, no sore throat, no syncope, no vomiting and no weakness     Past Medical History:  Diagnosis Date  . Anginal pain ([place])   . Arthritis   . Cerebrovascular disease   . CKD (chronic kidney disease)    Sees Dr Deterding  . Coronary artery disease   . Depression   . Diabetes ([place])    INSULIN DEPENDENT  . Diabetic peripheral neuropathy ([place])   . Diabetic  retinopathy ([place])   . Diverticulitis   . Dyslipidemia   . GERD (gastroesophageal reflux disease)   . Headache(784.0) 01/30/2013  . Heart murmur   . History of cerebrovascular accident 01/30/2013  . Hyperlipidemia   . Hypertension   . Migraine   . Obesity   . Stroke ([place])    x 2 no residulal  . TIA (transient ischemic attack)    " MULTIPLE PER PATIENT "; had left sided weakness- rehab and wants without assistance    Patient Active Problem List   Diagnosis Date Noted  . Chronic kidney disease (CKD), stage IV (severe) ([place]) 12/05/2017  . Post PTCA 01/07/2014  . CAD (coronary artery disease), native coronary artery 01/07/2014  . S/P PTCA (percutaneous transluminal coronary angioplasty) 01/07/2014  . Angina pectoris associated with type 2 diabetes mellitus ([place]) 01/06/2014  . History of cerebrovascular accident 01/30/2013  . Headache(784.0) 01/30/2013    Past Surgical History:  Procedure Laterality Date  . ABDOMINAL HYSTERECTOMY    . AV FISTULA PLACEMENT Left 12/18/2017   Procedure: ARTERIOVENOUS (AV) FISTULA CREATION ARM;  Surgeon: Marty Heck, MD;  Location: [facility];  Service: Vascular;  Laterality: Left;  . bilateral cataract surgery    . CARDIAC CATHETERIZATION  01/07/2014   DR Pryor RESECTION  02/1999  . COLONOSCOPY    .  COLOSTOMY  02/1999  . COLOSTOMY CLOSURE  07/1999  . EYE SURGERY Left 2019  . FISTULA SUPERFICIALIZATION Left 06/29/2018   Procedure: FISTULA SUPERFICIALIZATION LEFT BRACHIOCEPHALIC;  Surgeon: Marty Heck, MD;  Location: [facility];  Service: Vascular;  Laterality: Left;  . LEFT HEART CATHETERIZATION WITH CORONARY ANGIOGRAM N/A 01/07/2014   Procedure: LEFT HEART CATHETERIZATION WITH CORONARY ANGIOGRAM;  Surgeon: Laverda Page, MD;  Location: [facility] CATH LAB;  Service: Cardiovascular;  Laterality: N/A;  . middle cerebral artery stent placement Right   . OTHER SURGICAL HISTORY     laser surgery  . PTCA  01/07/2014   DES to RCA    DR Einar Gip   . right knee surgery Right    for infection     OB History   No obstetric history on file.      Home Medications    Prior to Admission medications   Medication Sig Start Date End Date Taking? Authorizing Provider  acetaminophen (TYLENOL) 325 MG tablet Take 650 mg by mouth every 6 (six) hours as needed (pain/headaches.).   Yes [provider]  allopurinol (ZYLOPRIM) 100 MG tablet Take 50 mg by mouth daily.   Yes [provider]  amLODipine (NORVASC) 10 MG tablet Take 10 mg by mouth daily.   Yes [provider]  aspirin EC 81 MG tablet Take 81 mg by mouth daily.   Yes [provider]  B-D INS SYR ULTRAFINE 1CC/30G 30G X 1/2" 1 ML MISC Inject 1 each into the skin 3 (three) times daily between meals. 12/18/12  Yes [provider]  calcitRIOL (ROCALTROL) 0.25 MCG capsule Take 0.5 mcg by mouth daily.   Yes [provider]  calcium carbonate (OSCAL) 1500 (600 Ca) MG TABS tablet Take 600 mg by mouth daily.   Yes [provider]  Ferrous Sulfate (IRON) 142 (45 Fe) MG TBCR Take 142 mg by mouth 2 (two) times daily.   Yes [provider]  folic acid (FOLVITE) 1 MG tablet Take 1 mg by mouth daily. 12/07/12  Yes [provider]  HUMALOG KWIKPEN 100 UNIT/ML KwikPen 4-6 Units by Subconjunctival route 3 (three) times daily before meals. Sliding Scale Insulin 05/28/18  Yes [provider]  insulin glargine (LANTUS) 100 UNIT/ML injection Inject 12 Units into the skin at bedtime.    Yes [provider]  labetalol (NORMODYNE) 300 MG tablet Take 1 tablet by mouth twice daily Patient taking differently: Take 300 mg by mouth 2 (two) times daily.  06/25/18  Yes Adrian Prows, MD  leflunomide (ARAVA) 10 MG tablet Take 10 mg by mouth daily.   Yes [provider]  lisinopril (ZESTRIL) 5 MG tablet Take 5 mg by mouth daily.   Yes [provider]  Magnesium 250 MG TABS Take 250 mg by mouth daily.   Yes  [provider]  niacin (NIASPAN) 500 MG CR tablet Take 500 mg by mouth daily.  01/29/18  Yes [provider]  nortriptyline (PAMELOR) 10 MG capsule Take 10 mg by mouth daily. 12/17/12  Yes [provider]  omeprazole (PRILOSEC) 40 MG capsule Take 40 mg by mouth daily.   Yes [provider]  ONE TOUCH ULTRA TEST test strip 1 each by Other route 3 (three) times daily between meals. 12/18/12  Yes [provider]  oxyCODONE-acetaminophen (PERCOCET) 5-325 MG tablet Take 1 tablet by mouth every 6 (six) hours as needed for severe pain. 06/29/18  Yes Rhyne, Samantha J, PA-C  polyvinyl alcohol (  TEARS AGAIN) 1.4 % ophthalmic solution Place 1 drop into both eyes 3 (three) times daily as needed for dry eyes.   Yes [provider]  PRISTIQ 100 MG 24 hr tablet Take 100 mg by mouth daily.  12/29/12  Yes [provider]  rosuvastatin (CRESTOR) 20 MG tablet Take 20 mg by mouth daily.   Yes [provider]  torsemide (DEMADEX) 20 MG tablet Take 20 mg by mouth daily. 12/29/12  Yes [provider]  nitroGLYCERIN (NITROSTAT) 0.4 MG SL tablet PLACE 1 TABLET (0.4 MG TOTAL) UNDER THE TONGUE EVERY 5 MINUTES X 3 DOSES AS NEEDED FOR CHEST PAIN. Patient not taking: No sig reported 09/06/18   Adrian Prows, MD    Family History Family History  Problem Relation Age of Onset  . Diabetes Mother   . Heart disease Mother   . Prostate cancer Father   . Hypertension Brother   . Prostate cancer Brother     Social History Social History   Tobacco Use  . Smoking status: Never Smoker  . Smokeless tobacco: Never Used  Substance Use Topics  . Alcohol use: No  . Drug use: No     Allergies   Patient has no known allergies.   Review of Systems Review of Systems  Constitutional: Negative for fever.  HENT: Negative for ear pain, hearing loss and sore throat.   Eyes: Positive for blurred vision and visual disturbance. Negative for pain.   Respiratory: Positive for cough and shortness of breath.   Cardiovascular: Negative for chest pain and syncope.  Gastrointestinal: Negative for abdominal pain, diarrhea, nausea and vomiting.  Genitourinary: Negative for dysuria.  Musculoskeletal: Negative for neck pain.  Skin: Negative for rash.  Neurological: Positive for headaches. Negative for focal weakness and weakness.     Physical Exam Updated Vital Signs BP (!) 147/44   Pulse (!) 59   Temp 97.6 F (36.4 C) (Oral)   Resp 15   SpO2 99%   Physical Exam Vitals signs and nursing note reviewed.  Constitutional:      General: She is not in acute distress.    Appearance: She is well-developed.  HENT:     Head: Normocephalic and atraumatic.  Eyes:     General: Visual field deficit present.     Extraocular Movements: Extraocular movements intact.     Right eye: No nystagmus.     Left eye: No nystagmus.     Conjunctiva/sclera: Conjunctivae normal.     Pupils: Pupils are equal, round, and reactive to light.  Neck:     Musculoskeletal: Normal range of motion and neck supple.  Cardiovascular:     Rate and Rhythm: Normal rate and regular rhythm.     Heart sounds: No murmur.  Pulmonary:     Effort: Pulmonary effort is normal. No respiratory distress.     Breath sounds: Normal breath sounds.  Abdominal:     Palpations: Abdomen is soft.     Tenderness: There is no abdominal tenderness.  Musculoskeletal: Normal range of motion.  Skin:    General: Skin is warm and dry.  Neurological:     Mental Status: She is alert.     GCS: GCS eye subscore is 4. GCS verbal subscore is 5. GCS motor subscore is 6.     Cranial Nerves: Cranial nerve deficit present. No dysarthria or facial asymmetry.     Sensory: No sensory deficit.     Motor: No weakness.     Comments: She had difficulty  with left superior field on visual confrontation test.  Otherwise was able to count fingers in all other fields.      ED Treatments / Results  Labs  (all labs ordered are listed, but only abnormal results are displayed) Labs Reviewed  APTT - Abnormal; Notable for the following components:      Result Value   aPTT 20 (*)    All other components within normal limits  CBC - Abnormal; Notable for the following components:   RBC 3.57 (*)    Hemoglobin 10.7 (*)    HCT 33.7 (*)    All other components within normal limits  COMPREHENSIVE METABOLIC PANEL - Abnormal; Notable for the following components:   Glucose, Bld 194 (*)    BUN 39 (*)    Creatinine, Ser 3.72 (*)    GFR calc non Af Amer 11 (*)    GFR calc Af Amer 13 (*)    All other components within normal limits  SEDIMENTATION RATE - Abnormal; Notable for the following components:   Sed Rate 25 (*)    All other components within normal limits  RENAL FUNCTION PANEL - Abnormal; Notable for the following components:   Glucose, Bld 183 (*)    BUN 38 (*)    Creatinine, Ser 3.61 (*)    Albumin 3.3 (*)    GFR calc non Af Amer 11 (*)    GFR calc Af Amer 13 (*)    Anion gap 16 (*)    All other components within normal limits  CBC - Abnormal; Notable for the following components:   WBC 11.6 (*)    RBC 3.76 (*)    Hemoglobin 11.4 (*)    All other components within normal limits  HEMOGLOBIN A1C - Abnormal; Notable for the following components:   Hgb A1c MFr Bld 7.6 (*)    All other components within normal limits  I-STAT CHEM 8, ED - Abnormal; Notable for the following components:   Sodium 134 (*)    BUN 47 (*)    Creatinine, Ser 3.80 (*)    Glucose, Bld 187 (*)    Calcium, Ion 1.13 (*)    Hemoglobin 11.2 (*)    HCT 33.0 (*)    All other components within normal limits  CBG MONITORING, ED - Abnormal; Notable for the following components:   Glucose-Capillary 184 (*)    All other components within normal limits  CBG MONITORING, ED - Abnormal; Notable for the following components:   Glucose-Capillary 123 (*)    All other components within normal limits  CBG MONITORING, ED -  Abnormal; Notable for the following components:   Glucose-Capillary 166 (*)    All other components within normal limits  POCT I-STAT 7, (LYTES, BLD GAS, ICA,H+H) - Abnormal; Notable for the following components:   pO2, Arterial 78.0 (*)    All other components within normal limits  SARS CORONAVIRUS 2  PROTIME-INR  DIFFERENTIAL  LIPID PANEL    EKG EKG Interpretation  Date/Time:  Sunday September 09 2018 16:04:53 EDT Ventricular Rate:  62 PR Interval:    QRS Duration: 98 QT Interval:  440 QTC Calculation: 447 R Axis:   6 Text Interpretation:  Sinus rhythm Probable left atrial enlargement Consider anterior infarct Borderline T abnormalities, inferior leads compared with prior 12/15 possible more ischemia Confirmed by Aletta Edouard [phone]) on 09/09/2018 4:24:58 PM   Radiology Ct Head Wo Contrast  Result Date: 09/09/2018 CLINICAL DATA:  78 year old female with severe headache onset this  morning and loss of vision. EXAM: CT HEAD WITHOUT CONTRAST TECHNIQUE: Contiguous axial images were obtained from the base of the skull through the vertex without intravenous contrast. COMPARISON:  Brain MRI 07/17/2010. Head CT 07/22/2009. FINDINGS: Brain: Chronic encephalomalacia in the posterosuperior right frontal lobe appears not significantly changed since 2012. However, there is a 3 centimeter area of hypodensity most compatible with cytotoxic edema in the right occipital lobe on series 3, image 12. this area was normal in 2012. No associated hemorrhage. No significant mass effect. Elsewhere gray-white matter differentiation is within normal limits. No midline shift, ventriculomegaly, mass effect, evidence of mass lesion. Vascular: Calcified atherosclerosis at the skull base. There is also a chronic right MCA M1 stent vascular stent visible on series 6, image 24. No suspicious intracranial vascular hyperdensity. Skull: No acute osseous abnormality identified. Sinuses/Orbits: Visualized paranasal sinuses and  mastoids are stable and well pneumatized. Other: Postoperative changes to both globes. No acute orbit or scalp soft tissue finding. IMPRESSION: 1. Positive for acute to subacute Right PCA territory infarct affecting the right occipital lobe. No associated hemorrhage or mass effect. 2. No other acute intracranial abnormality. Chronic posterior right MCA territory infarct and right M1 vascular stent. Electronically Signed   By: Genevie Ann [name]   On: 09/09/2018 17:02   Dg Chest Port 1 View  Result Date: 09/10/2018 CLINICAL DATA:  78 year old female with increasing shortness of breath. EXAM: PORTABLE CHEST 1 VIEW COMPARISON:  None. FINDINGS: There is mild cardiomegaly with vascular congestion and edema. Small bilateral pleural effusions with associated bibasilar atelectasis. Pneumonia is not excluded. No pneumothorax. No acute osseous pathology. IMPRESSION: Mild cardiomegaly with findings of CHF and small bilateral pleural effusions. Electronically Signed   By: Anner Crete [name]   On: 09/10/2018 02:09    Procedures Procedures (including critical care time)  Medications Ordered in ED Medications  sodium chloride flush (NS) 0.9 % injection 3 mL (has no administration in time range)     Initial Impression / Assessment and Plan / ED Course  I have reviewed the triage vital signs and the nursing notes.  Pertinent labs & imaging results that were available during my care of the patient were reviewed by me and considered in my medical decision making (see chart for details).  Clinical Course as of Sep 10 842  Sun Sep 09, 2018  1709 Head CT reviewed by me.  I do not see any obvious bleed or mass.  Awaiting radiology reading.  Patient's labs coming back with a low hemoglobin at 10.7 but better than her baseline.  Creatinine is also abnormal at 3.8 on the i-STAT.  Waiting for her lab chemistry to come back.   [MB]  [id number] Patient CT being read as acute to subacute right PCA infarct.  I have paged  neurology for their input.   [MB]  [id number] Discussed with Dr.Aroor from neurology is recommending the patient be admitted to the medical service and they can proceed with getting an MRI MRA   [MB]    Clinical Course User Index [MB] Hayden Rasmussen, MD        Final Clinical Impressions(s) / ED Diagnoses   Final diagnoses:  Acute CVA (cerebrovascular accident) [facility])  Visual disturbance    ED Discharge Orders    None       Hayden Rasmussen, MD 09/10/18 [phone]

## 2018-09-09 NOTE — ED Triage Notes (Signed)
Pt here to be evaluated for severe headache that began this AM.  Pt also states she went to "grab Tylenol" and could not see well enough to find the body.  Pt endorses loss of vision that is a chronic issue.  Grip strength intact bilaterally. No drift noted in all extremities.

## 2018-09-10 ENCOUNTER — Inpatient Hospital Stay (HOSPITAL_COMMUNITY): Payer: Medicare Other

## 2018-09-10 DIAGNOSIS — Z8739 Personal history of other diseases of the musculoskeletal system and connective tissue: Secondary | ICD-10-CM

## 2018-09-10 DIAGNOSIS — I340 Nonrheumatic mitral (valve) insufficiency: Secondary | ICD-10-CM

## 2018-09-10 DIAGNOSIS — I639 Cerebral infarction, unspecified: Secondary | ICD-10-CM

## 2018-09-10 DIAGNOSIS — I361 Nonrheumatic tricuspid (valve) insufficiency: Secondary | ICD-10-CM

## 2018-09-10 DIAGNOSIS — I5031 Acute diastolic (congestive) heart failure: Secondary | ICD-10-CM

## 2018-09-10 DIAGNOSIS — R202 Paresthesia of skin: Secondary | ICD-10-CM

## 2018-09-10 DIAGNOSIS — E1165 Type 2 diabetes mellitus with hyperglycemia: Secondary | ICD-10-CM

## 2018-09-10 DIAGNOSIS — N184 Chronic kidney disease, stage 4 (severe): Secondary | ICD-10-CM

## 2018-09-10 DIAGNOSIS — Z8673 Personal history of transient ischemic attack (TIA), and cerebral infarction without residual deficits: Secondary | ICD-10-CM

## 2018-09-10 DIAGNOSIS — H539 Unspecified visual disturbance: Secondary | ICD-10-CM

## 2018-09-10 DIAGNOSIS — I350 Nonrheumatic aortic (valve) stenosis: Secondary | ICD-10-CM

## 2018-09-10 DIAGNOSIS — I63431 Cerebral infarction due to embolism of right posterior cerebral artery: Principal | ICD-10-CM

## 2018-09-10 LAB — RENAL FUNCTION PANEL
Albumin: 3.3 g/dL — ABNORMAL LOW (ref 3.5–5.0)
Anion gap: 16 — ABNORMAL HIGH (ref 5–15)
BUN: 38 mg/dL — ABNORMAL HIGH (ref 8–23)
CO2: 22 mmol/L (ref 22–32)
Calcium: 9.4 mg/dL (ref 8.9–10.3)
Chloride: 99 mmol/L (ref 98–111)
Creatinine, Ser: 3.61 mg/dL — ABNORMAL HIGH (ref 0.44–1.00)
GFR calc Af Amer: 13 mL/min — ABNORMAL LOW (ref >60)
GFR calc non Af Amer: 11 mL/min — ABNORMAL LOW (ref >60)
Glucose, Bld: 183 mg/dL — ABNORMAL HIGH (ref 70–99)
Phosphorus: 4.3 mg/dL (ref 2.5–4.6)
Potassium: 4.7 mmol/L (ref 3.5–5.1)
Sodium: 137 mmol/L (ref 135–145)

## 2018-09-10 LAB — POCT I-STAT 7, (LYTES, BLD GAS, ICA,H+H)
Acid-base deficit: 1 mmol/L (ref 0.0–2.0)
Bicarbonate: 24.4 mmol/L (ref 20.0–28.0)
Calcium, Ion: 1.22 mmol/L (ref 1.15–1.40)
HCT: 36 % (ref 36.0–46.0)
Hemoglobin: 12.2 g/dL (ref 12.0–15.0)
O2 Saturation: 95 %
Patient temperature: 98.6
Potassium: 4.2 mmol/L (ref 3.5–5.1)
Sodium: 135 mmol/L (ref 135–145)
TCO2: 26 mmol/L (ref 22–32)
pCO2 arterial: 41.9 mmHg (ref 32.0–48.0)
pH, Arterial: 7.373 (ref 7.350–7.450)
pO2, Arterial: 78 mmHg — ABNORMAL LOW (ref 83.0–108.0)

## 2018-09-10 LAB — GLUCOSE, CAPILLARY
Glucose-Capillary: 165 mg/dL — ABNORMAL HIGH (ref 70–99)
Glucose-Capillary: 111 mg/dL — ABNORMAL HIGH (ref 70–99)
Glucose-Capillary: 164 mg/dL — ABNORMAL HIGH (ref 70–99)
Glucose-Capillary: 129 mg/dL — ABNORMAL HIGH (ref 70–99)

## 2018-09-10 LAB — CBG MONITORING, ED: Glucose-Capillary: 166 mg/dL — ABNORMAL HIGH (ref 70–99)

## 2018-09-10 LAB — LIPID PANEL
Cholesterol: 147 mg/dL (ref 0–200)
HDL: 76 mg/dL (ref >40)
LDL Cholesterol: 58 mg/dL (ref 0–99)
Total CHOL/HDL Ratio: 1.9 RATIO
Triglycerides: 64 mg/dL (ref <150)
VLDL: 13 mg/dL (ref 0–40)

## 2018-09-10 LAB — ECHOCARDIOGRAM COMPLETE
Height: 62 in
Weight: 2777.6 oz

## 2018-09-10 LAB — CBC
HCT: 36.2 % (ref 36.0–46.0)
Hemoglobin: 11.4 g/dL — ABNORMAL LOW (ref 12.0–15.0)
MCH: 30.3 pg (ref 26.0–34.0)
MCHC: 31.5 g/dL (ref 30.0–36.0)
MCV: 96.3 fL (ref 80.0–100.0)
Platelets: 195 10*3/uL (ref 150–400)
RBC: 3.76 MIL/uL — ABNORMAL LOW (ref 3.87–5.11)
RDW: 13.6 % (ref 11.5–15.5)
WBC: 11.6 10*3/uL — ABNORMAL HIGH (ref 4.0–10.5)
nRBC: 0 % (ref 0.0–0.2)

## 2018-09-10 LAB — HEMOGLOBIN A1C
Hgb A1c MFr Bld: 7.6 % — ABNORMAL HIGH (ref 4.8–5.6)
Mean Plasma Glucose: 171.42 mg/dL

## 2018-09-10 LAB — SARS CORONAVIRUS 2 (TAT 6-24 HRS): SARS Coronavirus 2: NEGATIVE

## 2018-09-10 MED ORDER — FUROSEMIDE 10 MG/ML IJ SOLN
80.0000 mg | Freq: Once | INTRAMUSCULAR | Status: AC
  Administered 2018-09-10: 80 mg via INTRAVENOUS
  Filled 2018-09-10: qty 8

## 2018-09-10 MED ORDER — LORAZEPAM 2 MG/ML IJ SOLN
1.0000 mg | Freq: Once | INTRAMUSCULAR | Status: AC
  Administered 2018-09-10: 1 mg via INTRAVENOUS

## 2018-09-10 MED ORDER — IPRATROPIUM-ALBUTEROL 0.5-2.5 (3) MG/3ML IN SOLN
RESPIRATORY_TRACT | Status: AC
  Administered 2018-09-10: 02:00:00 via RESPIRATORY_TRACT
  Filled 2018-09-10: qty 3

## 2018-09-10 MED ORDER — ASPIRIN 300 MG RE SUPP: 300.0000 mg | Freq: Every day | RECTAL | Status: DC

## 2018-09-10 MED ORDER — CLOPIDOGREL BISULFATE 75 MG PO TABS
75.0000 mg | ORAL_TABLET | Freq: Every day | ORAL | Status: DC
  Administered 2018-09-10 – 2018-09-12 (×3): 75 mg via ORAL
  Filled 2018-09-10 (×3): qty 1

## 2018-09-10 MED ORDER — IPRATROPIUM-ALBUTEROL 0.5-2.5 (3) MG/3ML IN SOLN
3.0000 mL | RESPIRATORY_TRACT | Status: DC | PRN
  Administered 2018-09-10: 02:00:00 via RESPIRATORY_TRACT

## 2018-09-10 MED ORDER — ASPIRIN EC 81 MG PO TBEC
81.0000 mg | DELAYED_RELEASE_TABLET | Freq: Every day | ORAL | Status: DC
  Administered 2018-09-11 – 2018-09-12 (×2): 81 mg via ORAL
  Filled 2018-09-10 (×2): qty 1

## 2018-09-10 MED ORDER — ASPIRIN 325 MG PO TABS: 325.0000 mg | ORAL_TABLET | Freq: Every day | ORAL | Status: DC

## 2018-09-10 MED ORDER — ROSUVASTATIN CALCIUM 20 MG PO TABS
20.0000 mg | ORAL_TABLET | Freq: Every day | ORAL | Status: DC
  Administered 2018-09-10 – 2018-09-11 (×2): 20 mg via ORAL
  Filled 2018-09-10 (×2): qty 1

## 2018-09-10 MED ORDER — LORAZEPAM 2 MG/ML IJ SOLN
INTRAMUSCULAR | Status: AC
  Filled 2018-09-10: qty 1

## 2018-09-10 MED ORDER — MORPHINE SULFATE (PF) 2 MG/ML IV SOLN
1.0000 mg | INTRAVENOUS | Status: DC | PRN
  Administered 2018-09-10 (×3): 2 mg via INTRAVENOUS
  Filled 2018-09-10 (×3): qty 1

## 2018-09-10 MED ORDER — CALCIUM CARBONATE 1250 (500 CA) MG PO TABS
1.0000 | ORAL_TABLET | Freq: Every day | ORAL | Status: DC
  Administered 2018-09-10 – 2018-09-12 (×3): 500 mg via ORAL
  Filled 2018-09-10 (×3): qty 1

## 2018-09-10 NOTE — Progress Notes (Addendum)
PT Cancellation Note  Patient Details Name: [name] MRN: [id number] DOB: December 09, 1940   Cancelled Treatment:    Reason Eval/Treat Not Completed: Patient at procedure or test/unavailable. Pt in MRI. Will follow up as schedule allows.  Christophe Louis, SPT  Christophe Louis 09/10/2018, 4:45 PM

## 2018-09-10 NOTE — ED Notes (Signed)
Patient transported to MRI 

## 2018-09-10 NOTE — H&P (Deleted)
History and Physical    [name] [id number] DOB: 11-30-1940 DOA: 09/09/2018  PCP: Merrilee Seashore, MD Patient coming from: Home  Chief Complaint: Vision loss  HPI: [name] is a 78 y.o. female with medical history significant of previous stroke, depression, diabetes, peripheral neuropathy, CAD, migraine.  Patient presented secondary to symptoms of vision loss in addition to significant posterior headache.  Patient states that she woke up this morning and noticed that she had vision loss when she tried to look at a picture and was only able to see half of the picture.  She had associated severe headache that she describes as throbbing and located posterior in addition to radiating down the right temporal area into the back of her eye. Patient took some oxycodone which did not help with her symptoms. Laying down did not help her headache.  Later in the day, patient checked her blood pressure which was abnormal with a low diastolic number in the [id number] to 40s and she proceeded to call her brother to take her to the hospital for evaluation.  ED Course: Vitals: afebrile, normal pulse, normal respirations, BP of 150/50, on room air Labs: Glucose of 194, BUN of 39, Creatinine of 3.72, hemoglobin of 10.7 Imaging: CT head significant for acute/subacute right PCA territory infarct affecting right occipital lobe Medications/Course: Fentanyl IV  Review of Systems: Review of Systems  Constitutional: Negative for chills and fever.  Eyes: Negative for blurred vision.  Respiratory: Positive for shortness of breath (chronic). Negative for cough.   Cardiovascular: Negative for chest pain and palpitations.  Gastrointestinal: Negative for abdominal pain, blood in stool, constipation, diarrhea, nausea and vomiting.  Neurological: Positive for tingling and headaches.    Past Medical History:  Diagnosis Date   Anginal pain ([place])    Arthritis    Cerebrovascular disease    CKD (chronic  kidney disease)    Sees Dr Deterding   Coronary artery disease    Depression    Diabetes ([place])    INSULIN DEPENDENT   Diabetic peripheral neuropathy ([place])    Diabetic retinopathy ([place])    Diverticulitis    Dyslipidemia    GERD (gastroesophageal reflux disease)    Headache(784.0) 01/30/2013   Heart murmur    History of cerebrovascular accident 01/30/2013   Hyperlipidemia    Hypertension    Migraine    Obesity    Stroke ([place])    x 2 no residulal   TIA (transient ischemic attack)    " MULTIPLE PER PATIENT "; had left sided weakness- rehab and wants without assistance    Past Surgical History:  Procedure Laterality Date   ABDOMINAL HYSTERECTOMY     AV FISTULA PLACEMENT Left 12/18/2017   Procedure: ARTERIOVENOUS (AV) FISTULA CREATION ARM;  Surgeon: Marty Heck, MD;  Location: [facility];  Service: Vascular;  Laterality: Left;   bilateral cataract surgery     CARDIAC CATHETERIZATION  01/07/2014   DR Einar Gip   COLON RESECTION  02/1999   COLONOSCOPY     COLOSTOMY  02/1999   COLOSTOMY CLOSURE  07/1999   EYE SURGERY Left 2019   FISTULA SUPERFICIALIZATION Left 06/29/2018   Procedure: FISTULA SUPERFICIALIZATION LEFT BRACHIOCEPHALIC;  Surgeon: Marty Heck, MD;  Location: [facility];  Service: Vascular;  Laterality: Left;   LEFT HEART CATHETERIZATION WITH CORONARY ANGIOGRAM N/A 01/07/2014   Procedure: LEFT HEART CATHETERIZATION WITH CORONARY ANGIOGRAM;  Surgeon: Laverda Page, MD;  Location: [facility] CATH LAB;  Service: Cardiovascular;  Laterality: N/A;  middle cerebral artery stent placement Right    OTHER SURGICAL HISTORY     laser surgery   PTCA  01/07/2014   DES to RCA    DR Einar Gip   right knee surgery Right    for infection     reports that she has never smoked. She has never used smokeless tobacco. She reports that she does not drink alcohol or use drugs.  No Known Allergies  Family History  Problem Relation Age of Onset   Diabetes  Mother    Heart disease Mother    Prostate cancer Father    Hypertension Brother    Prostate cancer Brother    Prior to Admission medications   Medication Sig Start Date End Date Taking? Authorizing Provider  acetaminophen (TYLENOL) 325 MG tablet Take 650 mg by mouth every 6 (six) hours as needed (pain/headaches.).   Yes [provider]  allopurinol (ZYLOPRIM) 100 MG tablet Take 50 mg by mouth daily.   Yes [provider]  amLODipine (NORVASC) 10 MG tablet Take 10 mg by mouth daily.   Yes [provider]  aspirin EC 81 MG tablet Take 81 mg by mouth daily.   Yes [provider]  B-D INS SYR ULTRAFINE 1CC/30G 30G X 1/2" 1 ML MISC Inject 1 each into the skin 3 (three) times daily between meals. 12/18/12  Yes [provider]  calcitRIOL (ROCALTROL) 0.25 MCG capsule Take 0.5 mcg by mouth daily.   Yes [provider]  calcium carbonate (OSCAL) 1500 (600 Ca) MG TABS tablet Take 600 mg by mouth daily.   Yes [provider]  Ferrous Sulfate (IRON) 142 (45 Fe) MG TBCR Take 142 mg by mouth 2 (two) times daily.   Yes [provider]  folic acid (FOLVITE) 1 MG tablet Take 1 mg by mouth daily. 12/07/12  Yes [provider]  HUMALOG KWIKPEN 100 UNIT/ML KwikPen 4-6 Units by Subconjunctival route 3 (three) times daily before meals. Sliding Scale Insulin 05/28/18  Yes [provider]  insulin glargine (LANTUS) 100 UNIT/ML injection Inject 12 Units into the skin at bedtime.    Yes [provider]  labetalol (NORMODYNE) 300 MG tablet Take 1 tablet by mouth twice daily Patient taking differently: Take 300 mg by mouth 2 (two) times daily.  06/25/18  Yes Adrian Prows, MD  leflunomide (ARAVA) 10 MG tablet Take 10 mg by mouth daily.   Yes [provider]  lisinopril (ZESTRIL) 5 MG tablet Take 5 mg by mouth daily.   Yes [provider]  Magnesium 250 MG TABS Take 250 mg by mouth daily.   Yes [provider]  niacin (NIASPAN) 500 MG CR tablet Take 500 mg by mouth daily.  01/29/18  Yes [provider]  nortriptyline (PAMELOR) 10 MG capsule Take 10 mg by mouth daily. 12/17/12  Yes [provider]  omeprazole (PRILOSEC) 40 MG capsule Take 40 mg by mouth daily.   Yes [provider]  ONE TOUCH ULTRA TEST test strip 1 each by Other route 3 (three) times daily between meals. 12/18/12  Yes [provider]  oxyCODONE-acetaminophen (PERCOCET) 5-325 MG tablet Take 1 tablet by mouth every 6 (six) hours as needed for severe pain. 06/29/18  Yes Rhyne, Hulen Shouts, PA-C  polyvinyl alcohol (TEARS AGAIN) 1.4 % ophthalmic solution Place 1 drop into both eyes 3 (three) times daily as needed for dry eyes.   Yes [provider]  PRISTIQ 100 MG 24 hr tablet Take  100 mg by mouth daily.  12/29/12  Yes [provider]  rosuvastatin (CRESTOR) 20 MG tablet Take 20 mg by mouth daily.   Yes [provider]  torsemide (DEMADEX) 20 MG tablet Take 20 mg by mouth daily. 12/29/12  Yes [provider]  nitroGLYCERIN (NITROSTAT) 0.4 MG SL tablet PLACE 1 TABLET (0.4 MG TOTAL) UNDER THE TONGUE EVERY 5 MINUTES X 3 DOSES AS NEEDED FOR CHEST PAIN. Patient not taking: No sig reported 09/06/18   Adrian Prows, MD    Physical Exam:  Physical Exam Constitutional:      General: She is not in acute distress.    Appearance: She is well-developed. She is not diaphoretic.  Eyes:     Conjunctiva/sclera: Conjunctivae normal.     Pupils: Pupils are equal, round, and reactive to light.  Neck:     Musculoskeletal: Normal range of motion.  Cardiovascular:     Rate and Rhythm: Normal rate and regular rhythm.     Heart sounds: Murmur present. Crescendo  decrescendo  systolic murmur present with a grade of 3/6.  Pulmonary:     Effort: Pulmonary effort is normal. No respiratory distress.     Breath sounds: Normal breath sounds. No wheezing or rales.  Abdominal:      General: Bowel sounds are normal. There is no distension.     Palpations: Abdomen is soft.     Tenderness: There is no abdominal tenderness. There is no guarding or rebound.  Musculoskeletal: Normal range of motion.        General: No tenderness.  Lymphadenopathy:     Cervical: No cervical adenopathy.  Skin:    General: Skin is warm and dry.  Neurological:     Mental Status: She is alert and oriented to person, place, and time.     Comments: Left visual field deficit      Labs on Admission: I have personally reviewed following labs and imaging studies  CBC: Recent Labs  Lab 09/09/18 1639 09/09/18 1655 09/10/18 0213 09/10/18 0638  WBC 8.7  --   --  11.6*  NEUTROABS 6.5  --   --   --   HGB 10.7* 11.2* 12.2 11.4*  HCT 33.7* 33.0* 36.0 36.2  MCV 94.4  --   --  96.3  PLT 176  --   --  0000000    Basic Metabolic Panel: Recent Labs  Lab 09/09/18 1639 09/09/18 1655 09/10/18 0213 09/10/18 0507  NA 138 134* 135 137  K 4.4 4.5 4.2 4.7  CL 100 101  --  99  CO2 25  --   --  22  GLUCOSE 194* 187*  --  183*  BUN 39* 47*  --  38*  CREATININE 3.72* 3.80*  --  3.61*  CALCIUM 10.1  --   --  9.4  PHOS  --   --   --  4.3    GFR: Estimated Creatinine Clearance: 12.1 mL/min (A) (by C-G formula based on SCr of 3.61 mg/dL (H)).  Liver Function Tests: Recent Labs  Lab 09/09/18 1639 09/10/18 0507  AST 25  --   ALT 19  --   ALKPHOS 72  --   BILITOT 0.7  --   PROT 6.8  --   ALBUMIN 3.5 3.3*   No results for input(s): LIPASE, AMYLASE in the last 168 hours. No results for input(s): AMMONIA in the last 168 hours.  Coagulation Profile: Recent Labs  Lab 09/09/18 1639  INR 1.1  Cardiac Enzymes: No results for input(s): CKTOTAL, CKMB, CKMBINDEX, TROPONINI in the last 168 hours.  BNP (last 3 results) No results for input(s): PROBNP in the last 8760 hours.  HbA1C: Recent Labs    09/10/18 0639  HGBA1C 7.6*    CBG: Recent Labs  Lab 09/09/18 1641 09/09/18 2155  09/10/18 0125  GLUCAP 184* 123* 166*    Lipid Profile: Recent Labs    09/10/18 0507  CHOL 147  HDL 76  LDLCALC 58  TRIG 64  CHOLHDL 1.9    Thyroid Function Tests: No results for input(s): TSH, T4TOTAL, FREET4, T3FREE, THYROIDAB in the last 72 hours.  Anemia Panel: No results for input(s): VITAMINB12, FOLATE, FERRITIN, TIBC, IRON, RETICCTPCT in the last 72 hours.  Urine analysis:    Component Value Date/Time   COLORURINE YELLOW 07/23/2009 0528   APPEARANCEUR CLEAR 07/23/2009 0528   LABSPEC 1.013 07/23/2009 0528   PHURINE 6.0 07/23/2009 0528   GLUCOSEU NEGATIVE 07/23/2009 0528   HGBUR NEGATIVE 07/23/2009 0528   BILIRUBINUR NEGATIVE 07/23/2009 0528   KETONESUR NEGATIVE 07/23/2009 0528   PROTEINUR NEGATIVE 07/23/2009 0528   UROBILINOGEN 0.2 07/23/2009 0528   NITRITE NEGATIVE 07/23/2009 0528   LEUKOCYTESUR SMALL (A) 07/23/2009 0528     Radiological Exams on Admission: Ct Head Wo Contrast  Result Date: 09/09/2018 CLINICAL DATA:  78 year old female with severe headache onset this morning and loss of vision. EXAM: CT HEAD WITHOUT CONTRAST TECHNIQUE: Contiguous axial images were obtained from the base of the skull through the vertex without intravenous contrast. COMPARISON:  Brain MRI 07/17/2010. Head CT 07/22/2009. FINDINGS: Brain: Chronic encephalomalacia in the posterosuperior right frontal lobe appears not significantly changed since 2012. However, there is a 3 centimeter area of hypodensity most compatible with cytotoxic edema in the right occipital lobe on series 3, image 12. this area was normal in 2012. No associated hemorrhage. No significant mass effect. Elsewhere gray-white matter differentiation is within normal limits. No midline shift, ventriculomegaly, mass effect, evidence of mass lesion. Vascular: Calcified atherosclerosis at the skull base. There is also a chronic right MCA M1 stent vascular stent visible on series 6, image 24. No suspicious intracranial vascular  hyperdensity. Skull: No acute osseous abnormality identified. Sinuses/Orbits: Visualized paranasal sinuses and mastoids are stable and well pneumatized. Other: Postoperative changes to both globes. No acute orbit or scalp soft tissue finding. IMPRESSION: 1. Positive for acute to subacute Right PCA territory infarct affecting the right occipital lobe. No associated hemorrhage or mass effect. 2. No other acute intracranial abnormality. Chronic posterior right MCA territory infarct and right M1 vascular stent. Electronically Signed   By: Genevie Ann [name]   On: 09/09/2018 17:02   Dg Chest Port 1 View  Result Date: 09/10/2018 CLINICAL DATA:  78 year old female with increasing shortness of breath. EXAM: PORTABLE CHEST 1 VIEW COMPARISON:  None. FINDINGS: There is mild cardiomegaly with vascular congestion and edema. Small bilateral pleural effusions with associated bibasilar atelectasis. Pneumonia is not excluded. No pneumothorax. No acute osseous pathology. IMPRESSION: Mild cardiomegaly with findings of CHF and small bilateral pleural effusions. Electronically Signed   By: Anner Crete [name]   On: 09/10/2018 02:09    EKG: Independently reviewed. Inferior/lateral lead t-wave changes which   Assessment/Plan Active Problems:   Stroke ([place])   Acute stroke Previous history of stroke. Current right PICA territory occipital stroke. Neurology on board.  -Neurology recs. Aspirin 325, MRI brain, MRA head w/o contrast, carotid dopplers; SBP goal <180 -Lipid panel, hemoglobin A1C -Hydralazine IV prn SBP >  180 -PT, OT, SLP -Transthoracic Echocardiogram  Headache Possibly secondary to stroke per discussion with ED (who discussed with neuro). Resolved currently.  CKD stage IV-V Secondary hyperparathyroidism Patient follows with [facility]. Creatinine appears to be stable. -Continue Calcitriol, [place], torsemide  Diabetes mellitus, type 2 Diabetic neuropathy Patient is on insulin therapy as an outpatient -Continue  Lantus -SSI -Recommend changing directions for home Humalog so it does not read to "by subconjunctival" with regard to route of administration  GERD -Protonix  Hyperlipidemia CAD Essential hypertension Patient follows with Dr. Einar Gip as an outpatient. On lisinopril, labetalol, amlodipine -Continue Crestor -Hold outpatient antihypertensives; manage with IV hydralazine  Depression -Continue Pristiq (Effexor as substitute) and nortriptyline  Aortic stenosis Murmur consistent with diagnosis heard on exam. -Transthoracic Echocardiogram   DVT prophylaxis: Heparin subq Code Status: DNR Family Communication: Brother at bedside Disposition Plan: Telemetry Consults called: Neurology Admission status: Inpatient   Cordelia Poche, MD [facility] 09/10/2018, 7:49 AM

## 2018-09-10 NOTE — ED Notes (Signed)
Pt found at edge of bed; redirected to get back in bed.

## 2018-09-10 NOTE — ED Notes (Signed)
MD called back and orders to be updated

## 2018-09-10 NOTE — ED Notes (Signed)
Tele

## 2018-09-10 NOTE — Evaluation (Signed)
Clinical/Bedside Swallow Evaluation Patient Details  Name: [name] MRN: [id number] Date of Birth: 08-26-1940  Today's Date: 09/10/2018 Time: SLP Start Time (ACUTE ONLY): 1255 SLP Stop Time (ACUTE ONLY): 1310 SLP Time Calculation (min) (ACUTE ONLY): 15 min  Past Medical History:  Past Medical History:  Diagnosis Date  . Anginal pain ([place])   . Arthritis   . Cerebrovascular disease   . CKD (chronic kidney disease)    Sees Dr Deterding  . Coronary artery disease   . Depression   . Diabetes ([place])    INSULIN DEPENDENT  . Diabetic peripheral neuropathy ([place])   . Diabetic retinopathy ([place])   . Diverticulitis   . Dyslipidemia   . GERD (gastroesophageal reflux disease)   . Headache(784.0) 01/30/2013  . Heart murmur   . History of cerebrovascular accident 01/30/2013  . Hyperlipidemia   . Hypertension   . Migraine   . Obesity   . Stroke ([place])    x 2 no residulal  . TIA (transient ischemic attack)    " MULTIPLE PER PATIENT "; had left sided weakness- rehab and wants without assistance   Past Surgical History:  Past Surgical History:  Procedure Laterality Date  . ABDOMINAL HYSTERECTOMY    . AV FISTULA PLACEMENT Left 12/18/2017   Procedure: ARTERIOVENOUS (AV) FISTULA CREATION ARM;  Surgeon: Marty Heck, MD;  Location: [facility];  Service: Vascular;  Laterality: Left;  . bilateral cataract surgery    . CARDIAC CATHETERIZATION  01/07/2014   DR [place] RESECTION  02/1999  . COLONOSCOPY    . COLOSTOMY  02/1999  . COLOSTOMY CLOSURE  07/1999  . EYE SURGERY Left 2019  . FISTULA SUPERFICIALIZATION Left 06/29/2018   Procedure: FISTULA SUPERFICIALIZATION LEFT BRACHIOCEPHALIC;  Surgeon: Marty Heck, MD;  Location: [facility];  Service: Vascular;  Laterality: Left;  . LEFT HEART CATHETERIZATION WITH CORONARY ANGIOGRAM N/A 01/07/2014   Procedure: LEFT HEART CATHETERIZATION WITH CORONARY ANGIOGRAM;  Surgeon: Laverda Page, MD;  Location: [facility] CATH LAB;  Service:  Cardiovascular;  Laterality: N/A;  . middle cerebral artery stent placement Right   . OTHER SURGICAL HISTORY     laser surgery  . PTCA  01/07/2014   DES to RCA    DR Einar Gip  . right knee surgery Right    for infection   HPI:   78 y.o. female with medical history significant of previous stroke, depression, diabetes, peripheral neuropathy, CAD, migraine.  Patient presented secondary to symptoms of vision loss in addition to significant posterior headache.  Patient states that she woke up this morning and noticed that she had vision loss when she tried to look at a picture and was only able to see half of the picture.  She had associated severe headache that she describes as throbbing and located posterior in addition to radiating down the right temporal area into the back of her eye. CT head significant for acute/subacute right PCA territory infarct affecting right occipital lobe   Assessment / Plan / Recommendation Clinical Impression  A mild oral dysphagia was exhibited. No overt s/sx of aspiration with any PO, including during 3 oz water challenge. Pt notes mildly reduced left facial sensation post acute CVA. Dried left sided secretions observed. Pt did exhibit adequate oral clearance with solids. Swallow initiation appeared timely per palpation and vocal qualitly remained clear. Recommend regular thin liquid diet. SLP to follow up x1 to ensure diet tolerance.   SLP Visit Diagnosis: Dysphagia, unspecified (R13.10)  Aspiration Risk  Mild aspiration risk    Diet Recommendation  Regular, thin liquids    Medication Administration: (as tolerated)    Other  Recommendations Oral Care Recommendations: Oral care BID   Follow up Recommendations        Frequency and Duration min 1 x/week  1 week       Prognosis Prognosis for Safe Diet Advancement: Good      Swallow Study   General Date of Onset: 09/09/18 HPI:  78 y.o. female with medical history significant of previous stroke,  depression, diabetes, peripheral neuropathy, CAD, migraine.  Patient presented secondary to symptoms of vision loss in addition to significant posterior headache.  Patient states that she woke up this morning and noticed that she had vision loss when she tried to look at a picture and was only able to see half of the picture.  She had associated severe headache that she describes as throbbing and located posterior in addition to radiating down the right temporal area into the back of her eye. CT head significant for acute/subacute right PCA territory infarct affecting right occipital lobe Type of Study: Bedside Swallow Evaluation Previous Swallow Assessment: none on file  Diet Prior to this Study: NPO Temperature Spikes Noted: No Respiratory Status: Nasal cannula History of Recent Intubation: No Behavior/Cognition: Alert;Cooperative;Pleasant mood Oral Cavity Assessment: Within Functional Limits Oral Cavity - Dentition: Adequate natural dentition Vision: Functional for self-feeding Self-Feeding Abilities: Able to feed self Patient Positioning: Upright in bed Baseline Vocal Quality: Normal Volitional Swallow: Able to elicit    Oral/Motor/Sensory Function Overall Oral Motor/Sensory Function: Mild impairment Facial ROM: Within Functional Limits Facial Symmetry: Within Functional Limits Facial Sensation: Reduced left Lingual ROM: Within Functional Limits Lingual Symmetry: Within Functional Limits Lingual Strength: Reduced   Ice Chips Ice chips: Within functional limits   Thin Liquid Thin Liquid: Within functional limits    Nectar Thick Nectar Thick Liquid: Not tested   Honey Thick Honey Thick Liquid: Not tested   Puree Puree: Not tested   Solid     Solid: Within functional limits Presentation: Self Fed      [name] E [name] MA, CCC-SLP Acute Rehabilitation Services  09/10/2018,1:36 PM

## 2018-09-10 NOTE — ED Notes (Signed)
Patient is at 95% SpO2 on 10L Nonrebreather.

## 2018-09-10 NOTE — Evaluation (Signed)
Speech Language Pathology Evaluation Patient Details Name: [name] MRN: [id number] DOB: [birth year] Today's Date: 09/10/2018 Time: [id number] SLP Time Calculation (min) (ACUTE ONLY): 18 min  Problem List:  Patient Active Problem List   Diagnosis Date Noted  . Stroke ([place]) 09/09/2018  . Chronic kidney disease (CKD), stage IV (severe) ([place]) 12/05/2017  . Post PTCA 01/07/2014  . CAD (coronary artery disease), native coronary artery 01/07/2014  . S/P PTCA (percutaneous transluminal coronary angioplasty) 01/07/2014  . Angina pectoris associated with type 2 diabetes mellitus ([place]) 01/06/2014  . History of cerebrovascular accident 01/30/2013  . Headache(784.0) 01/30/2013   Past Medical History:  Past Medical History:  Diagnosis Date  . Anginal pain ([place])   . Arthritis   . Cerebrovascular disease   . CKD (chronic kidney disease)    Sees Dr Deterding  . Coronary artery disease   . Depression   . Diabetes ([place])    INSULIN DEPENDENT  . Diabetic peripheral neuropathy ([place])   . Diabetic retinopathy ([place])   . Diverticulitis   . Dyslipidemia   . GERD (gastroesophageal reflux disease)   . Headache(784.0) 01/30/2013  . Heart murmur   . History of cerebrovascular accident 01/30/2013  . Hyperlipidemia   . Hypertension   . Migraine   . Obesity   . Stroke ([place])    x 2 no residulal  . TIA (transient ischemic attack)    " MULTIPLE PER PATIENT "; had left sided weakness- rehab and wants without assistance   Past Surgical History:  Past Surgical History:  Procedure Laterality Date  . ABDOMINAL HYSTERECTOMY    . AV FISTULA PLACEMENT Left 12/18/2017   Procedure: ARTERIOVENOUS (AV) FISTULA CREATION ARM;  Surgeon: Marty Heck, MD;  Location: [facility];  Service: Vascular;  Laterality: Left;  . bilateral cataract surgery    . CARDIAC CATHETERIZATION  01/07/2014   DR Hudson RESECTION  02/1999  . COLONOSCOPY    . COLOSTOMY  02/1999  . COLOSTOMY CLOSURE  07/1999  . EYE  SURGERY Left 2019  . FISTULA SUPERFICIALIZATION Left 06/29/2018   Procedure: FISTULA SUPERFICIALIZATION LEFT BRACHIOCEPHALIC;  Surgeon: Marty Heck, MD;  Location: [facility];  Service: Vascular;  Laterality: Left;  . LEFT HEART CATHETERIZATION WITH CORONARY ANGIOGRAM N/A 01/07/2014   Procedure: LEFT HEART CATHETERIZATION WITH CORONARY ANGIOGRAM;  Surgeon: Laverda Page, MD;  Location: [facility] CATH LAB;  Service: Cardiovascular;  Laterality: N/A;  . middle cerebral artery stent placement Right   . OTHER SURGICAL HISTORY     laser surgery  . PTCA  01/07/2014   DES to RCA    DR Einar Gip  . right knee surgery Right    for infection   HPI:   78 y.o. female with medical history significant of previous stroke, depression, diabetes, peripheral neuropathy, CAD, migraine.  Patient presented secondary to symptoms of vision loss in addition to significant posterior headache.  Patient states that she woke up this morning and noticed that she had vision loss when she tried to look at a picture and was only able to see half of the picture.  She had associated severe headache that she describes as throbbing and located posterior in addition to radiating down the right temporal area into the back of her eye. CT head significant for acute/subacute right PCA territory infarct affecting right occipital lobe   Assessment / Plan / Recommendation Clinical Impression  Cognitive linguistic abilities appear grossly within functional limits. Expressive and receptive language skills appear preserved. Motor  speech skills noted to be intact  Pt fully oriented able to give detailed PLOF. Pt states she currently lives alone in two story home. She was previoulsy living with her child who recently left for college. She retired from [employer]. Pt notes a left visual field cut as expected post right occipital region CVA. Pt pending further evaluations with PT/OT.      SLP Assessment  SLP Recommendation/Assessment:  Patient does not need any further Speech Lanaguage Pathology Services SLP Visit Diagnosis: Dysphagia, unspecified (R13.10)    Follow Up Recommendations       Frequency and Duration min 1 x/week         SLP Evaluation Cognition  Overall Cognitive Status: Within Functional Limits for tasks assessed(appeared intact) Arousal/Alertness: Awake/alert Orientation Level: Oriented X4 Attention: Focused;Sustained Focused Attention: Appears intact Sustained Attention: Appears intact Memory: Appears intact Awareness: Appears intact Problem Solving: Appears intact Executive Function: Reasoning;Organizing Reasoning: Appears intact Organizing: Appears intact Safety/Judgment: Appears intact       Comprehension  Auditory Comprehension Overall Auditory Comprehension: Appears within functional limits for tasks assessed Visual Recognition/Discrimination Discrimination: Exceptions to [facility]    Expression Expression Primary Mode of Expression: Verbal Verbal Expression Overall Verbal Expression: Appears within functional limits for tasks assessed Written Expression Dominant Hand: Right   Oral / Motor  Oral Motor/Sensory Function Overall Oral Motor/Sensory Function: Mild impairment Facial ROM: Within Functional Limits Facial Symmetry: Within Functional Limits Facial Sensation: Reduced left Lingual ROM: Within Functional Limits Lingual Symmetry: Within Functional Limits Lingual Strength: Reduced Motor Speech Overall Motor Speech: Appears within functional limits for tasks assessed   GO                    Tyeler Goedken E Juanelle Trueheart MA, CCC-SLP Acute Rehabilitation Services  09/10/2018, 1:50 PM

## 2018-09-10 NOTE — ED Notes (Signed)
BrotherKevan Ny [phone]

## 2018-09-10 NOTE — Progress Notes (Signed)
[name] [id number] Admission Data: 09/10/2018 7:29 PM Attending Provider: No att. providers found  [id number], Susan Kaufmann, MD Consults/ Treatment Team:   [name] is a 78 y.o. female patient admitted from ED awake, alert  & orientated  X 3,  DNR, VSS - Blood pressure (!) 136/49, pulse 66, temperature 98.3 F (36.8 C), temperature source Oral, resp. rate 17, height 5\' 2"  (1.575 m), weight 78.7 kg, SpO2 98 %., O2    4 L nasal cannular, no c/o shortness of breath, no c/o chest pain, no distress noted. Tele # [id number] placed and pt is currently running:normal sinus rhythm.   IV site WDL:  forearm right, condition patent and no redness with a transparent dsg that's clean dry and intact.  Allergies:  No Known Allergies   Past Medical History:  Diagnosis Date  . Anginal pain ([place])   . Arthritis   . Cerebrovascular disease   . CKD (chronic kidney disease)    Sees Dr Deterding  . Coronary artery disease   . Depression   . Diabetes ([place])    INSULIN DEPENDENT  . Diabetic peripheral neuropathy ([place])   . Diabetic retinopathy ([place])   . Diverticulitis   . Dyslipidemia   . GERD (gastroesophageal reflux disease)   . Headache(784.0) 01/30/2013  . Heart murmur   . History of cerebrovascular accident 01/30/2013  . Hyperlipidemia   . Hypertension   . Migraine   . Obesity   . Stroke ([place])    x 2 no residulal  . TIA (transient ischemic attack)    " MULTIPLE PER PATIENT "; had left sided weakness- rehab and wants without assistance      Pt orientation to unit, room and routine. Information packet given to patient/family and safety video watched.  Admission INP armband ID verified with patient/family, and in place. SR up x 2, fall risk assessment complete with Patient and family verbalizing understanding of risks associated with falls. Pt verbalizes an understanding of how to use the call bell and to call for help before getting out of bed.  Skin, clean-dry- intact without evidence of bruising,  or skin tears.   No evidence of skin break down noted on exam. no rashes, no ecchymoses, no petechiae, no nodules, no jaundice, no purpura, no wounds    Will cont to monitor and assist as needed.  Tresa Endo, RN 09/10/2018 7:29 PM

## 2018-09-10 NOTE — ED Notes (Signed)
Patient O2 increased to 15lpm

## 2018-09-10 NOTE — Progress Notes (Signed)
Carotid artery duplex and bilateral lower extremity venous duplex have been completed. Preliminary results can be found in CV Proc through chart review.   09/10/18 12:50 PM Carlos Levering RVT

## 2018-09-10 NOTE — ED Notes (Signed)
Patient cleaned and linens changed. Tolerated well.

## 2018-09-10 NOTE — Progress Notes (Signed)
  Echocardiogram 2D Echocardiogram has been performed.  Bobbye Charleston 09/10/2018, 12:35 PM

## 2018-09-10 NOTE — ED Notes (Signed)
RT notified

## 2018-09-10 NOTE — ED Notes (Signed)
Paged Dr Lonny Prude to Rn Tina--[name]

## 2018-09-10 NOTE — Progress Notes (Signed)
PROGRESS NOTE  [name] [id number] DOB: 06-16-1940   PCP: Merrilee Seashore, MD  Patient is from: Home.  DOA: 09/09/2018 LOS: 1  Brief Narrative / Interim history: 78 year old female with history of previous stroke, depression, DM-2, peripheral neuropathy, CAD, left eye cataract surgery and migraine presenting with acute vision loss mainly on the left, and posterior throbbing headache radiating to her right temple.  Reportedly had DBP in 30s and 40s at home.  In ED, BP 150/50.  CBG 194.  Creatinine 3.72.  CT head significant for acute/subacute right PCA territory infarct affecting the right occipital lobe.  Neurology started and patient was admitted for CVA.  Patient had acute respiratory distress with desaturation to 70% on 5 L requiring NRB.  She was given IV Lasix with improvement in her breathing and weaned down to 4 L by nasal cannula.  Currently saturating at 100%.  ABG not remarkable.  Assessment & Plan: Acute right PICA territory CVA/left hemianopsia: Left hemianopsia improved.  Headache resolved. -Received aspirin 325 mg -Neurology on board -Follow further CVA work-up including MRI brain/MRA head, carotid Dopplers -SBP goal less than 180. -DAPT (Plavix 75 mg and ASA 81 mg) daily for 3 weeks and then Plavix alone. -Neurology recommended loop recorder to exclude A. Fib. -Permissive hypertension for 5 to 7 days. -PT/OT/SLP  Acute respiratory failure with hypoxia likely due to CHF exacerbation. -Improved with IV Lasix.  Continue home torsemide. -Wean oxygen as able -Manage CHF exacerbation as below.  Diastolic CHF exacerbation: Echo with EF of 60 to 65%, severe asymmetric LVH, severe AS and moderately elevated RV pressure.  CXR concerning for CHF.  Lung exam with crackles bilaterally.  She is followed by Dr. Einar Gip.  -Consulted her cardiologist for assistance. -Continue home torsemide.  -Daily weight, intake output and renal function. -Salt and fluid  restriction.  Hypertension: BP was in fair range. -Hold home medications for permissive hypertension  Controlled IDDM-2 with renal complication and neuropathy: A1c 7.6%.  CBG was in fair range. -Continue current regimen -Continue home Crestor. -Could benefit from GLP-1 inhibitors on discharge.  History of CAD: No chest pain but the dyspnea likely from underlying CHF and aortic stenosis. -Continue statin as above. -May benefit from permissive hypertension.  CKD4/BMD: Renal function at baseline. -Continue monitoring -Continue home calcitriol and p.o. iron.  History of rheumatoid arthritis/chronic pain: Stable -Continue home [place]. -PRN Tylenol and oxycodone.  Mood disorder: Stable -Continue home Pristiq and nortriptyline.  Obesity: BMI 31.75. -Could benefit from GLP-1 inhibitors given diabetes and cardiac history.  Chronic pain: -PRN Percocet and Tylenol.  DVT prophylaxis: Subcu heparin Code Status: DNR/DNI Family Communication: Patient and/or RN. Available if any question. Disposition Plan: Remains inpatient for CVA evaluation and treatment Consultants: Neurology, cardiology  Procedures:  None  Microbiology summarized: COVID-19 screen negative.  Antimicrobials: Anti-infectives (From admission, onward)   None      Sch Meds:  Scheduled Meds:   stroke: mapping our early stages of recovery book   Does not apply Once   allopurinol  50 mg Oral Daily   [START ON 09/11/2018] aspirin EC  81 mg Oral Daily   calcitRIOL  0.5 mcg Oral Daily   calcium carbonate  1 tablet Oral Q breakfast   clopidogrel  75 mg Oral Daily   ferrous sulfate  325 mg Oral BID   folic acid  1 mg Oral Daily   heparin  5,000 Units Subcutaneous Q8H   insulin aspart  0-9 Units Subcutaneous TID WC   insulin  glargine  12 Units Subcutaneous QHS   leflunomide  10 mg Oral Daily   magnesium oxide  200 mg Oral Daily   niacin  500 mg Oral Daily   nortriptyline  10 mg Oral QHS    pantoprazole  40 mg Oral Daily   rosuvastatin  20 mg Oral q1800   torsemide  20 mg Oral Daily   venlafaxine XR  150 mg Oral Q breakfast   Continuous Infusions: PRN Meds:.acetaminophen **OR** acetaminophen (TYLENOL) oral liquid 160 mg/5 mL **OR** acetaminophen, hydrALAZINE, ipratropium-albuterol, morphine injection, oxyCODONE-acetaminophen, senna-docusate   Subjective: Patient had acute respiratory distress with hypoxia to 70% requiring IV Lasix and nonrebreather.  Respiratory status improved after IV Lasix.  Currently saturating in upper 90s to 100 on 4 L by nasal cannula.  ABG was not impressive.  Currently denies chest pain, dyspnea or palpitation.  She says her vision is improving but not quite back to baseline.  Headache resolved.  She denies focal weakness, numbness or tingling.  Denies GI or GU symptoms.   Objective: Vitals:   09/10/18 0743 09/10/18 0918 09/10/18 1250 09/10/18 1612  BP: (!) 147/41 (!) 123/45 (!) 117/49 (!) 136/49  Pulse: 69 65 62 66  Resp: (!) 26 (!) 22 20 17   Temp:  97.9 F (36.6 C) 97.9 F (36.6 C) 98.3 F (36.8 C)  TempSrc:  Oral Oral Oral  SpO2: 97% 100% 100% 98%  Weight:  78.7 kg    Height:  5\' 2"  (1.575 m)      Intake/Output Summary (Last 24 hours) at 09/10/2018 1702 Last data filed at 09/10/2018 1250 Gross per 24 hour  Intake 500 ml  Output 1750 ml  Net -1250 ml   Filed Weights   09/10/18 0918  Weight: 78.7 kg    Examination:  GENERAL: No acute distress.  Appears well.  HEENT: MMM.  Vision and hearing grossly intact.  PERRL. Loses tracking on the left NECK: Supple.  No apparent JVD.  RESP:  No IWOB.  Fair air movement bilaterally.  Mild bibasilar crackles. CVS:  RRR.  3/6 SEM more pronounced over LUSB and LLSB. ABD/GI/GU: Bowel sounds present. Soft. Non tender.  MSK/EXT:  Moves extremities. No apparent deformity or edema.  SKIN: no apparent skin lesion or wound NEURO: Awake, alert and oriented appropriately.  Speech clear.  Mild visual  deficit on the left.  Diminished light sensation in left upper and lower extremities.  Motor 5/5 in all extremities.  Patellar reflex symmetric.  No pronator drift.  Kindall-to-nose intact. PSYCH: Calm. Normal affect.   I have personally reviewed the following labs and images: CBC: Recent Labs  Lab 09/09/18 1639 09/09/18 1655 09/10/18 0213 09/10/18 0638  WBC 8.7  --   --  11.6*  NEUTROABS 6.5  --   --   --   HGB 10.7* 11.2* 12.2 11.4*  HCT 33.7* 33.0* 36.0 36.2  MCV 94.4  --   --  96.3  PLT 176  --   --  195   BMP &GFR Recent Labs  Lab 09/09/18 1639 09/09/18 1655 09/10/18 0213 09/10/18 0507  NA 138 134* 135 137  K 4.4 4.5 4.2 4.7  CL 100 101  --  99  CO2 25  --   --  22  GLUCOSE 194* 187*  --  183*  BUN 39* 47*  --  38*  CREATININE 3.72* 3.80*  --  3.61*  CALCIUM 10.1  --   --  9.4  PHOS  --   --   --  4.3   Estimated Creatinine Clearance: 12.3 mL/min (A) (by C-G formula based on SCr of 3.61 mg/dL (H)). Liver & Pancreas: Recent Labs  Lab 09/09/18 1639 09/10/18 0507  AST 25  --   ALT 19  --   ALKPHOS 72  --   BILITOT 0.7  --   PROT 6.8  --   ALBUMIN 3.5 3.3*   No results for input(s): LIPASE, AMYLASE in the last 168 hours. No results for input(s): AMMONIA in the last 168 hours. Diabetic: Recent Labs    09/10/18 0639  HGBA1C 7.6*   Recent Labs  Lab 09/09/18 1641 09/09/18 2155 09/10/18 0125 09/10/18 0947 09/10/18 1248  GLUCAP 184* 123* 166* 165* 111*   Cardiac Enzymes: No results for input(s): CKTOTAL, CKMB, CKMBINDEX, TROPONINI in the last 168 hours. No results for input(s): PROBNP in the last 8760 hours. Coagulation Profile: Recent Labs  Lab 09/09/18 1639  INR 1.1   Thyroid Function Tests: No results for input(s): TSH, T4TOTAL, FREET4, T3FREE, THYROIDAB in the last 72 hours. Lipid Profile: Recent Labs    09/10/18 0507  CHOL 147  HDL 76  LDLCALC 58  TRIG 64  CHOLHDL 1.9   Anemia Panel: No results for input(s): VITAMINB12, FOLATE,  FERRITIN, TIBC, IRON, RETICCTPCT in the last 72 hours. Urine analysis:    Component Value Date/Time   COLORURINE YELLOW 07/23/2009 0528   APPEARANCEUR CLEAR 07/23/2009 0528   LABSPEC 1.013 07/23/2009 0528   PHURINE 6.0 07/23/2009 0528   GLUCOSEU NEGATIVE 07/23/2009 0528   HGBUR NEGATIVE 07/23/2009 0528   BILIRUBINUR NEGATIVE 07/23/2009 0528   KETONESUR NEGATIVE 07/23/2009 0528   PROTEINUR NEGATIVE 07/23/2009 0528   UROBILINOGEN 0.2 07/23/2009 0528   NITRITE NEGATIVE 07/23/2009 0528   LEUKOCYTESUR SMALL (A) 07/23/2009 0528   Sepsis Labs: Invalid input(s): PROCALCITONIN, [place]  Microbiology: Recent Results (from the past 240 hour(s))  SARS CORONAVIRUS 2 Nasal Swab Aptima Multi Swab     Status: None   Collection Time: 09/09/18  6:51 PM   Specimen: Aptima Multi Swab; Nasal Swab  Result Value Ref Range Status   SARS Coronavirus 2 NEGATIVE NEGATIVE Final    Comment: (NOTE) SARS-CoV-2 target nucleic acids are NOT DETECTED. The SARS-CoV-2 RNA is generally detectable in upper and lower respiratory specimens during the acute phase of infection. Negative results do not preclude SARS-CoV-2 infection, do not rule out co-infections with other pathogens, and should not be used as the sole basis for treatment or other patient management decisions. Negative results must be combined with clinical observations, patient history, and epidemiological information. The expected result is Negative. Fact Sheet for Patients: SugarRoll.be Fact Sheet for Healthcare Providers: https://www.woods-mathews.com/ This test is not yet approved or cleared by the [place] FDA and  has been authorized for detection and/or diagnosis of SARS-CoV-2 by FDA under an Emergency Use Authorization (EUA). This EUA will remain  in effect (meaning this test can be used) for the duration of the COVID-19 declaration under Section 56 4(b)(1) of the Act, 21 U.S.C. section  360bbb-3(b)(1), unless the authorization is terminated or revoked sooner. Performed at [facility], [place] [address] [place], [place] [address]     Radiology Studies: Dg Chest [place] 1 View  Result Date: 09/10/2018 CLINICAL DATA:  78 year old female with increasing shortness of breath. EXAM: PORTABLE CHEST 1 VIEW COMPARISON:  None. FINDINGS: There is mild cardiomegaly with vascular congestion and edema. Small bilateral pleural effusions with associated bibasilar atelectasis. Pneumonia is not excluded. No pneumothorax. No acute osseous  pathology. IMPRESSION: Mild cardiomegaly with findings of CHF and small bilateral pleural effusions. Electronically Signed   By: Anner Crete [name]   On: 09/10/2018 02:09   Vas US Carotid  Result Date: 09/10/2018 Carotid Arterial Duplex Study Indications:       CVA. Risk Factors:      None. Limitations        Today's exam was limited due to the high bifurcation of the                    carotid and Vessel tortuosity. Comparison Study:  No prior studies. Performing Technologist: Oliver Hum RVT  Examination Guidelines: A complete evaluation includes B-mode imaging, spectral Doppler, color Doppler, and power Doppler as needed of all accessible portions of each vessel. Bilateral testing is considered an integral part of a complete examination. Limited examinations for reoccurring indications may be performed as noted.  Right Carotid Findings: +----------+--------+-------+--------+--------------------------------+--------+             PSV cm/s EDV     Stenosis Plaque Description               Comments                       cm/s                                                        +----------+--------+-------+--------+--------------------------------+--------+  CCA Prox   62       0                smooth and heterogenous          tortuous  +----------+--------+-------+--------+--------------------------------+--------+  CCA Distal 43       6                smooth  and heterogenous                    +----------+--------+-------+--------+--------------------------------+--------+  ICA Prox   39       5                smooth, heterogenous and                                                         calcific                                   +----------+--------+-------+--------+--------------------------------+--------+  ICA Distal 45       7                                                 tortuous  +----------+--------+-------+--------+--------------------------------+--------+  ECA        35       6                                                           +----------+--------+-------+--------+--------------------------------+--------+ +----------+--------+-------+--------+-------------------+  PSV cm/s EDV cms Describe Arm Pressure (mmHG)  +----------+--------+-------+--------+-------------------+  Subclavian 112                                            +----------+--------+-------+--------+-------------------+ +---------+--------+--+--------+-+---------+  Vertebral PSV cm/s 57 EDV cm/s 8 Antegrade  +---------+--------+--+--------+-+---------+   Left Carotid Findings: +----------+--------+--------+--------+-----------------------+--------+             PSV cm/s EDV cm/s Stenosis Plaque Description      Comments  +----------+--------+--------+--------+-----------------------+--------+  CCA Prox   53       0                 smooth and heterogenous tortuous  +----------+--------+--------+--------+-----------------------+--------+  CCA Distal 54       5                 smooth and heterogenous           +----------+--------+--------+--------+-----------------------+--------+  ICA Prox   94       9                 smooth and heterogenous tortuous  +----------+--------+--------+--------+-----------------------+--------+  ICA Distal 107      14                                        tortuous  +----------+--------+--------+--------+-----------------------+--------+  ECA         84       0                                                   +----------+--------+--------+--------+-----------------------+--------+ +----------+--------+--------+--------+-------------------+  Subclavian PSV cm/s EDV cm/s Describe Arm Pressure (mmHG)  +----------+--------+--------+--------+-------------------+             193      38                                     +----------+--------+--------+--------+-------------------+ +---------+--------+--+--------+-+---------+  Vertebral PSV cm/s 48 EDV cm/s 6 Antegrade  +---------+--------+--+--------+-+---------+   Summary: Right Carotid: Velocities in the right ICA are consistent with a 1-39% stenosis. Left Carotid: Velocities in the left ICA are consistent with a 1-39% stenosis. Vertebrals: Bilateral vertebral arteries demonstrate antegrade flow. *See table(s) above for measurements and observations.     Preliminary    Vas [name] Lower Extremity Venous (dvt)  Result Date: 09/10/2018  Lower Venous Study Indications: CVA.  Risk Factors: None identified. Comparison Study: No prior studies. Performing Technologist: Oliver Hum RVT  Examination Guidelines: A complete evaluation includes B-mode imaging, spectral Doppler, color Doppler, and power Doppler as needed of all accessible portions of each vessel. Bilateral testing is considered an integral part of a complete examination. Limited examinations for reoccurring indications may be performed as noted.  +---------+---------------+---------+-----------+----------+--------------+  RIGHT     Compressibility Phasicity Spontaneity Properties Thrombus Aging  +---------+---------------+---------+-----------+----------+--------------+  CFV       Full            Yes       Yes                                    +---------+---------------+---------+-----------+----------+--------------+  SFJ       Full                                                              +---------+---------------+---------+-----------+----------+--------------+  FV Prox   Full                                                             +---------+---------------+---------+-----------+----------+--------------+  FV Mid    Full                                                             +---------+---------------+---------+-----------+----------+--------------+  FV Distal Full                                                             +---------+---------------+---------+-----------+----------+--------------+  PFV       Full                                                             +---------+---------------+---------+-----------+----------+--------------+  POP       Full            Yes       Yes                                    +---------+---------------+---------+-----------+----------+--------------+  PTV       Full                                                             +---------+---------------+---------+-----------+----------+--------------+  PERO      Full                                                             +---------+---------------+---------+-----------+----------+--------------+   +---------+---------------+---------+-----------+----------+--------------+  LEFT      Compressibility Phasicity Spontaneity Properties Thrombus Aging  +---------+---------------+---------+-----------+----------+--------------+  CFV       Full            Yes       Yes                                    +---------+---------------+---------+-----------+----------+--------------+  SFJ       Full                                                             +---------+---------------+---------+-----------+----------+--------------+  FV Prox   Full                                                             +---------+---------------+---------+-----------+----------+--------------+  FV Mid    Full                                                              +---------+---------------+---------+-----------+----------+--------------+  FV Distal Full                                                             +---------+---------------+---------+-----------+----------+--------------+  PFV       Full                                                             +---------+---------------+---------+-----------+----------+--------------+  POP       Full            Yes       Yes                                    +---------+---------------+---------+-----------+----------+--------------+  PTV       Full                                                             +---------+---------------+---------+-----------+----------+--------------+  PERO      Full                                                             +---------+---------------+---------+-----------+----------+--------------+     Summary: Right: There is no evidence of deep vein thrombosis in the lower extremity. No cystic structure found in the popliteal fossa. Left: There is no evidence of deep vein thrombosis in the lower extremity. No cystic structure found in the popliteal fossa.  *See table(s) above for measurements and observations.  Preliminary     35 minutes with more than 50% spent in reviewing records, counseling patient and coordinating care.  Catalyna Reilly T. [place]  If 7PM-7AM, please contact night-coverage [email or web address] Password [email or web address] 09/10/2018, 5:02 PM

## 2018-09-10 NOTE — ED Notes (Signed)
Secretary to page MD to update orders for IV medications.

## 2018-09-10 NOTE — ED Notes (Signed)
Pt. Came back from MRI and was unable to do the MRI due to the patient crawling around and off the table. Brought patient back to the room, SpO2 was 70% 5L, nonrebreathing 15Lo2 applied patient is now SpO2 at 91%.  Pt. Is clammy and diaphoretic.

## 2018-09-10 NOTE — Progress Notes (Signed)
STROKE TEAM PROGRESS NOTE   INTERVAL HISTORY Her echo tech is at the bedside.  Pt awake alert orienated. Still has left upper quadratanopia. But no motor deficit. Discussed with loop recorder and she is in agreement.   Vitals:   09/10/18 0500 09/10/18 0545 09/10/18 0743 09/10/18 0918  BP: (!) 139/47 (!) 149/45 (!) 147/41 (!) 123/45  Pulse: 73 64 69 65  Resp: (!) 23 20 (!) 26 (!) 22  Temp:    97.9 F (36.6 C)  TempSrc:    Oral  SpO2: 99% 98% 97% 100%  Weight:    78.7 kg  Height:    5\' 2"  (1.575 m)    CBC:  Recent Labs  Lab 09/09/18 1639  09/10/18 0213 09/10/18 0638  WBC 8.7  --   --  11.6*  NEUTROABS 6.5  --   --   --   HGB 10.7*   < > 12.2 11.4*  HCT 33.7*   < > 36.0 36.2  MCV 94.4  --   --  96.3  PLT 176  --   --  195   < > = values in this interval not displayed.    Basic Metabolic Panel:  Recent Labs  Lab 09/09/18 1639 09/09/18 1655 09/10/18 0213 09/10/18 0507  NA 138 134* 135 137  K 4.4 4.5 4.2 4.7  CL 100 101  --  99  CO2 25  --   --  22  GLUCOSE 194* 187*  --  183*  BUN 39* 47*  --  38*  CREATININE 3.72* 3.80*  --  3.61*  CALCIUM 10.1  --   --  9.4  PHOS  --   --   --  4.3   Lipid Panel:     Component Value Date/Time   CHOL 147 09/10/2018 0507   TRIG 64 09/10/2018 0507   HDL 76 09/10/2018 0507   CHOLHDL 1.9 09/10/2018 0507   VLDL 13 09/10/2018 0507   LDLCALC 58 09/10/2018 0507   HgbA1c:  Lab Results  Component Value Date   HGBA1C 7.6 (H) 09/10/2018   Urine Drug Screen:     Component Value Date/Time   LABOPIA NONE DETECTED 11/06/2006 1319   COCAINSCRNUR NONE DETECTED 11/06/2006 1319   LABBENZ NONE DETECTED 11/06/2006 1319   AMPHETMU NONE DETECTED 11/06/2006 1319   THCU NONE DETECTED 11/06/2006 1319   LABBARB  11/06/2006 1319    NONE DETECTED        DRUG SCREEN FOR MEDICAL PURPOSES ONLY.  IF CONFIRMATION IS NEEDED FOR ANY PURPOSE, NOTIFY LAB WITHIN 5 DAYS.    Alcohol Level     Component Value Date/Time   [facility]  11/06/2006 1229     <5        LOWEST DETECTABLE LIMIT FOR SERUM ALCOHOL IS 11 mg/dL FOR MEDICAL PURPOSES ONLY        LOWEST DETECTABLE LIMIT FOR    IMAGING Ct Head Wo Contrast  Result Date: 09/09/2018 CLINICAL DATA:  78 year old female with severe headache onset this morning and loss of vision. EXAM: CT HEAD WITHOUT CONTRAST TECHNIQUE: Contiguous axial images were obtained from the base of the skull through the vertex without intravenous contrast. COMPARISON:  Brain MRI 07/17/2010. Head CT 07/22/2009. FINDINGS: Brain: Chronic encephalomalacia in the posterosuperior right frontal lobe appears not significantly changed since 2012. However, there is a 3 centimeter area of hypodensity most compatible with cytotoxic edema in the right occipital lobe on series 3, image 12. this area was normal in 2012.  No associated hemorrhage. No significant mass effect. Elsewhere gray-white matter differentiation is within normal limits. No midline shift, ventriculomegaly, mass effect, evidence of mass lesion. Vascular: Calcified atherosclerosis at the skull base. There is also a chronic right MCA M1 stent vascular stent visible on series 6, image 24. No suspicious intracranial vascular hyperdensity. Skull: No acute osseous abnormality identified. Sinuses/Orbits: Visualized paranasal sinuses and mastoids are stable and well pneumatized. Other: Postoperative changes to both globes. No acute orbit or scalp soft tissue finding. IMPRESSION: 1. Positive for acute to subacute Right PCA territory infarct affecting the right occipital lobe. No associated hemorrhage or mass effect. 2. No other acute intracranial abnormality. Chronic posterior right MCA territory infarct and right M1 vascular stent. Electronically Signed   By: Genevie Ann [name]   On: 09/09/2018 17:02   Dg Chest Port 1 View  Result Date: 09/10/2018 CLINICAL DATA:  78 year old female with increasing shortness of breath. EXAM: PORTABLE CHEST 1 VIEW COMPARISON:  None. FINDINGS: There is mild  cardiomegaly with vascular congestion and edema. Small bilateral pleural effusions with associated bibasilar atelectasis. Pneumonia is not excluded. No pneumothorax. No acute osseous pathology. IMPRESSION: Mild cardiomegaly with findings of CHF and small bilateral pleural effusions. Electronically Signed   By: Anner Crete [name]   On: 09/10/2018 02:09    PHYSICAL EXAM  Temp:  [97.6 F (36.4 C)-97.9 F (36.6 C)] 97.9 F (36.6 C) (08/24 0918) Pulse Rate:  [58-81] 65 (08/24 0918) Resp:  [15-42] 22 (08/24 0918) BP: (123-207)/(36-71) 123/45 (08/24 0918) SpO2:  [91 %-100 %] 100 % (08/24 0918) Weight:  [78.7 kg] 78.7 kg (08/24 0918)  General - Well nourished, well developed, in no apparent distress.  Ophthalmologic - fundi not visualized due to noncooperation.  Cardiovascular - Regular rate and rhythm.  Mental Status -  Level of arousal and orientation to time, place, and person were intact. Language including expression, naming, repetition, comprehension was assessed and found intact. Fund of Knowledge was assessed and was intact.  Cranial Nerves II - XII - II - left quadrantanopia . III, IV, VI - Extraocular movements intact. V - Facial sensation intact bilaterally. VII - Facial movement intact bilaterally. VIII - Hearing & vestibular intact bilaterally. X - Palate elevates symmetrically. XI - Chin turning & shoulder shrug intact bilaterally. XII - Tongue protrusion intact.  Motor Strength - The patient's strength was normal in all extremities and pronator drift was absent.  Bulk was normal and fasciculations were absent.   Motor Tone - Muscle tone was assessed at the neck and appendages and was normal.  Reflexes - The patient's reflexes were symmetrical in all extremities and she had no pathological reflexes.  Sensory - Light touch, temperature/pinprick were assessed and were symmetrical.    Coordination - The patient had normal movements in the hands with no ataxia or  dysmetria.  Tremor was absent.  Gait and Station - deferred.   ASSESSMENT/PLAN [name] is a 78 y.o. female with history of TIA, CVA x 2 ( no residual deficits), HTN, HLD, CKD, CAD, DM presenting with HA and vision loss.   Stroke:   R PCA infarct embolic secondary to unclear source  Resulted left upper quadrantanopia   CT head acute/subacute R PCA infarct occipital lobe. Old R MCA infarct w/ R M1 stent  MRI  pending  MRA  pending  Carotid Doppler  pending  2D Echo pending  LE doppler pending  Recommend loop recorder placement to rule out afib  LDL 58  HgbA1c 7.6  Heparin 5000 units sq tid for VTE prophylaxis  aspirin 81 mg daily prior to admission, now on aspirin 325 mg daily. Recommend ASA 81 and plavix 75 DAPT for 3 weeks and then plavix alone.  Therapy recommendations:  pending   Disposition:  pending   History Stroke  01/2013: per note Dr. Jannifer Franklin ( neurology): prior history of right frontal stroke, M1 stenosis with stent. The stent was subsequently occluded, however pt was doing well. At that time she was taking aggrenox.  Hypertension  Stable . Permissive hypertension (OK if < 220/120) but gradually normalize in 5-7 days . Long-term BP goal normotensive  Hyperlipidemia  Home meds:  crestosr 20 and niacin, resumed in hospital  LDL 58, goal < 70  Continue statin at discharge  Diabetes type II Uncontrolled Diabetic Neuropathy  HgbA1c 7.6, goal < 7.0  CBGs  SSI  On lantus  Close PCP follow up for better DM control  Other Stroke Risk Factors  Advanced age  Obesity, Body mass index is 31.75 kg/m., recommend weight loss, diet and exercise as appropriate   Coronary artery disease  migraine  Other Active Problems  HA  CKD stage IV-V - Cre 3.61  GERD  depression  Hospital day # 1  Susan Hawking, MD PhD Stroke Neurology 09/10/2018 11:51 AM   To contact Stroke Continuity provider, please refer to [email or web address] After hours,  contact General Neurology

## 2018-09-10 NOTE — Progress Notes (Signed)
Attempted MRI 120a, pt was attempted to roll and crawl off table, grabbing at staff and swinging her arms around.  The more we tried to calm her the more agitated she became and she would not keep her oxygen on.  We were able to get her back to her stretcher and to the ED where care was handed off to the nurses

## 2018-09-10 NOTE — ED Notes (Signed)
ED TO INPATIENT HANDOFF REPORT  ED Nurse Name and Phone #: [phone] Koden Hunzeker/Tina, RN  S Name/Age/Gender [name] 78 y.o. female Room/Bed: 039C/039C  Code Status   Code Status: DNR  Home/SNF/Other Home Patient oriented to: self, place, time and situation Is this baseline? Yes   Triage Complete: Triage complete  Chief Complaint headache vision loss  Triage Note Pt here to be evaluated for severe headache that began this AM.  Pt also states she went to "grab Tylenol" and could not see well enough to find the body.  Pt endorses loss of vision that is a chronic issue.  Grip strength intact bilaterally. No drift noted in all extremities.    Allergies No Known Allergies  Level of Care/Admitting Diagnosis ED Disposition    ED Disposition Condition Comment   Admit  Hospital Area: [place] [[id number]]  Level of Care: Telemetry Medical [104]  Covid Evaluation: Asymptomatic Screening Protocol (No Symptoms)  [id number]  Admitting Physician: Mariel Aloe [phone]  Attending Physician: Mariel Aloe [phone]  Estimated length of stay: past midnight tomorrow  Certification:: I certify there are rare and unusual circumstances requiring inpatient admission  PT Class (Do Not Modify): Inpatient [101]  PT Acc Code (Do Not Modify): Private [1]       B Medical/Surgery History Past Medical History:  Diagnosis Date  . Anginal pain ([place])   . Arthritis   . Cerebrovascular disease   . CKD (chronic kidney disease)    Sees Dr Deterding  . Coronary artery disease   . Depression   . Diabetes ([place])    INSULIN DEPENDENT  . Diabetic peripheral neuropathy ([place])   . Diabetic retinopathy ([place])   . Diverticulitis   . Dyslipidemia   . GERD (gastroesophageal reflux disease)   . Headache(784.0) 01/30/2013  . Heart murmur   . History of cerebrovascular accident 01/30/2013  . Hyperlipidemia   . Hypertension   . Migraine   . Obesity   . Stroke  ([place])    x 2 no residulal  . TIA (transient ischemic attack)    " MULTIPLE PER PATIENT "; had left sided weakness- rehab and wants without assistance   Past Surgical History:  Procedure Laterality Date  . ABDOMINAL HYSTERECTOMY    . AV FISTULA PLACEMENT Left 12/18/2017   Procedure: ARTERIOVENOUS (AV) FISTULA CREATION ARM;  Surgeon: Marty Heck, MD;  Location: [place];  Service: Vascular;  Laterality: Left;  . bilateral cataract surgery    . CARDIAC CATHETERIZATION  01/07/2014   DR [place] RESECTION  02/1999  . COLONOSCOPY    . COLOSTOMY  02/1999  . COLOSTOMY CLOSURE  07/1999  . EYE SURGERY Left 2019  . FISTULA SUPERFICIALIZATION Left 06/29/2018   Procedure: FISTULA SUPERFICIALIZATION LEFT BRACHIOCEPHALIC;  Surgeon: Marty Heck, MD;  Location: [place];  Service: Vascular;  Laterality: Left;  . LEFT HEART CATHETERIZATION WITH CORONARY ANGIOGRAM N/A 01/07/2014   Procedure: LEFT HEART CATHETERIZATION WITH CORONARY ANGIOGRAM;  Surgeon: Laverda Page, MD;  Location: [facility] CATH LAB;  Service: Cardiovascular;  Laterality: N/A;  . middle cerebral artery stent placement Right   . OTHER SURGICAL HISTORY     laser surgery  . PTCA  01/07/2014   DES to RCA    DR Einar Gip  . right knee surgery Right    for infection     A IV Location/Drains/Wounds Patient Lines/Drains/Airways Status   Active Line/Drains/Airways    Name:   Placement  date:   Placement time:   Site:   Days:   Peripheral IV 09/09/18 Right;Lateral Forearm   09/09/18    1715    Forearm   1   Fistula / Graft Left Upper arm Arteriovenous fistula   12/18/17    0900    Upper arm   266   Incision (Closed) 12/18/17 Arm Left   12/18/17    0744     266   Incision (Closed) 06/29/18 Arm Left   06/29/18    0723     73          Intake/Output Last 24 hours  Intake/Output Summary (Last 24 hours) at 09/10/2018 0017 Last data filed at 09/09/2018 2322 Gross per 24 hour  Intake 500 ml  Output -  Net 500 ml     Labs/Imaging Results for orders placed or performed during the hospital encounter of 09/09/18 (from the past 48 hour(s))  Protime-INR     Status: None   Collection Time: 09/09/18  4:39 PM  Result Value Ref Range   Prothrombin Time 13.6 11.4 - 15.2 seconds   INR 1.1 0.8 - 1.2    Comment: (NOTE) INR goal varies based on device and disease states. Performed at [facility], [place] [address] [place], [place] [address]   APTT     Status: Abnormal   Collection Time: 09/09/18  4:39 PM  Result Value Ref Range   aPTT 20 (L) 24 - 36 seconds    Comment: Performed at [facility] [address], [place], [place] [address]  CBC     Status: Abnormal   Collection Time: 09/09/18  4:39 PM  Result Value Ref Range   WBC 8.7 4.0 - 10.5 K/uL   RBC 3.57 (L) 3.87 - 5.11 MIL/uL   Hemoglobin 10.7 (L) 12.0 - 15.0 g/dL   HCT 33.7 (L) 36.0 - 46.0 %   MCV 94.4 80.0 - 100.0 fL   MCH 30.0 26.0 - 34.0 pg   MCHC 31.8 30.0 - 36.0 g/dL   RDW 13.6 11.5 - 15.5 %   Platelets 176 150 - 400 K/uL    Comment: REPEATED TO VERIFY   nRBC 0.0 0.0 - 0.2 %    Comment: Performed at [facility], [place] [address] [place], [place] [address]  Differential     Status: None   Collection Time: 09/09/18  4:39 PM  Result Value Ref Range   Neutrophils Relative % 75 %   Neutro Abs 6.5 1.7 - 7.7 K/uL   Lymphocytes Relative 15 %   Lymphs Abs 1.3 0.7 - 4.0 K/uL   Monocytes Relative 7 %   Monocytes Absolute 0.6 0.1 - 1.0 K/uL   Eosinophils Relative 2 %   Eosinophils Absolute 0.2 0.0 - 0.5 K/uL   Basophils Relative 1 %   Basophils Absolute 0.0 0.0 - 0.1 K/uL   Immature Granulocytes 0 %   Abs Immature Granulocytes 0.03 0.00 - 0.07 K/uL    Comment: Performed at [facility], [place] [address], [place],  [address]  Comprehensive metabolic panel     Status: Abnormal   Collection Time: 09/09/18  4:39 PM  Result Value Ref Range   Sodium 138 135 - 145 mmol/L   Potassium 4.4 3.5 - 5.1 mmol/L   Chloride  100 98 - 111 mmol/L   CO2 25 22 - 32 mmol/L   Glucose, Bld 194 (H) 70 - 99 mg/dL   BUN 39 (  H) 8 - 23 mg/dL   Creatinine, Ser 3.72 (H) 0.44 - 1.00 mg/dL   Calcium 10.1 8.9 - 10.3 mg/dL   Total Protein 6.8 6.5 - 8.1 g/dL   Albumin 3.5 3.5 - 5.0 g/dL   AST 25 15 - 41 U/L   ALT 19 0 - 44 U/L   Alkaline Phosphatase 72 38 - 126 U/L   Total Bilirubin 0.7 0.3 - 1.2 mg/dL   GFR calc non Af Amer 11 (L) >60 mL/min   GFR calc Af Amer 13 (L) >60 mL/min   Anion gap 13 5 - 15    Comment: Performed at [facility] [address], [place], [place] [address]  Sedimentation rate     Status: Abnormal   Collection Time: 09/09/18  4:39 PM  Result Value Ref Range   Sed Rate 25 (H) 0 - 22 mm/hr    Comment: Performed at [facility] [address], [place], [place] [address]  CBG monitoring, ED     Status: Abnormal   Collection Time: 09/09/18  4:41 PM  Result Value Ref Range   Glucose-Capillary 184 (H) 70 - 99 mg/dL  I-stat chem 8, ED     Status: Abnormal   Collection Time: 09/09/18  4:55 PM  Result Value Ref Range   Sodium 134 (L) 135 - 145 mmol/L   Potassium 4.5 3.5 - 5.1 mmol/L   Chloride 101 98 - 111 mmol/L   BUN 47 (H) 8 - 23 mg/dL   Creatinine, Ser 3.80 (H) 0.44 - 1.00 mg/dL   Glucose, Bld 187 (H) 70 - 99 mg/dL   Calcium, Ion 1.13 (L) 1.15 - 1.40 mmol/L   TCO2 28 22 - 32 mmol/L   Hemoglobin 11.2 (L) 12.0 - 15.0 g/dL   HCT 33.0 (L) 36.0 - 46.0 %  CBG monitoring, ED     Status: Abnormal   Collection Time: 09/09/18  9:55 PM  Result Value Ref Range   Glucose-Capillary 123 (H) 70 - 99 mg/dL   Ct Head Wo Contrast  Result Date: 09/09/2018 CLINICAL DATA:  78 year old female with severe headache onset this morning and loss of vision. EXAM: CT HEAD WITHOUT CONTRAST TECHNIQUE: Contiguous axial images were obtained from the base of the skull through the vertex without intravenous contrast. COMPARISON:  Brain MRI 07/17/2010. Head CT 07/22/2009. FINDINGS: Brain: Chronic encephalomalacia in  the posterosuperior right frontal lobe appears not significantly changed since 2012. However, there is a 3 centimeter area of hypodensity most compatible with cytotoxic edema in the right occipital lobe on series 3, image 12. this area was normal in 2012. No associated hemorrhage. No significant mass effect. Elsewhere gray-white matter differentiation is within normal limits. No midline shift, ventriculomegaly, mass effect, evidence of mass lesion. Vascular: Calcified atherosclerosis at the skull base. There is also a chronic right MCA M1 stent vascular stent visible on series 6, image 24. No suspicious intracranial vascular hyperdensity. Skull: No acute osseous abnormality identified. Sinuses/Orbits: Visualized paranasal sinuses and mastoids are stable and well pneumatized. Other: Postoperative changes to both globes. No acute orbit or scalp soft tissue finding. IMPRESSION: 1. Positive for acute to subacute Right PCA territory infarct affecting the right occipital lobe. No associated hemorrhage or mass effect. 2. No other acute intracranial abnormality. Chronic posterior right MCA territory infarct and right M1 vascular stent. Electronically Signed   By: Genevie Ann [name]   On: 09/09/2018 17:02    Pending Labs Unresulted Labs (From admission, onward)  Start     Ordered   09/10/18 0500  Hemoglobin A1c  Tomorrow morning,   R     09/09/18 2127   09/10/18 0500  Lipid panel  Tomorrow morning,   R    Comments: Fasting    09/09/18 2127   09/10/18 0500  CBC  Tomorrow morning,   R     09/09/18 2127   09/10/18 0500  Renal function panel  Tomorrow morning,   R     09/09/18 2127   09/09/18 1726  SARS CORONAVIRUS 2 Nasal Swab Aptima Multi Swab  (Asymptomatic/Tier 2 Patients Labs)  Once,   STAT    Question Answer Comment  Is this test for diagnosis or screening Screening   Symptomatic for COVID-19 as defined by CDC No   Hospitalized for COVID-19 No   Admitted to ICU for COVID-19 No   Previously tested for  COVID-19 Yes   Resident in a congregate (group) care setting No   Employed in healthcare setting No   Pregnant No      09/09/18 1725          Vitals/Pain Today's Vitals   09/09/18 2315 09/09/18 2330 09/09/18 2345 09/10/18 0000  BP: (!) 166/52 (!) 160/71 (!) 172/44 (!) 181/53  Pulse: 66 63 64 63  Resp: (!) 24 (!) 29 (!) 25 17  Temp:      TempSrc:      SpO2: 98% 95% 97% 98%  PainSc:        Isolation Precautions No active isolations  Medications Medications  oxyCODONE-acetaminophen (PERCOCET/ROXICET) 5-325 MG per tablet 1 tablet (has no administration in time range)  allopurinol (ZYLOPRIM) tablet 50 mg (has no administration in time range)  niacin (NIASPAN) CR tablet 500 mg (has no administration in time range)  rosuvastatin (CRESTOR) tablet 20 mg (has no administration in time range)  torsemide (DEMADEX) tablet 20 mg (has no administration in time range)  nortriptyline (PAMELOR) capsule 10 mg (has no administration in time range)  venlafaxine XR (EFFEXOR-XR) 24 hr capsule 150 mg (has no administration in time range)  calcitRIOL (ROCALTROL) capsule 0.5 mcg (has no administration in time range)  insulin glargine (LANTUS) injection 12 Units (12 Units Subcutaneous Given 09/09/18 2306)  pantoprazole (PROTONIX) EC tablet 40 mg (has no administration in time range)  ferrous sulfate tablet 325 mg (325 mg Oral Not Given [id number])  folic acid (FOLVITE) tablet 1 mg (has no administration in time range)  calcium carbonate (OSCAL) tablet 600 mg (has no administration in time range)  magnesium oxide (MAG-OX) tablet 200 mg (has no administration in time range)  leflunomide (ARAVA) tablet 10 mg (has no administration in time range)   stroke: mapping our early stages of recovery book (has no administration in time range)  acetaminophen (TYLENOL) tablet 650 mg (has no administration in time range)    Or  acetaminophen (TYLENOL) solution 650 mg (has no administration in time range)    Or   acetaminophen (TYLENOL) suppository 650 mg (has no administration in time range)  senna-docusate (Senokot-S) tablet 1 tablet (has no administration in time range)  heparin injection 5,000 Units (5,000 Units Subcutaneous Given 09/09/18 2344)  insulin aspart (novoLOG) injection 0-9 Units (has no administration in time range)  LORazepam (ATIVAN) tablet 1 mg (has no administration in time range)  hydrALAZINE (APRESOLINE) injection 10 mg (has no administration in time range)  sodium chloride 0.9 % bolus 500 mL (0 mLs Intravenous Stopped 09/09/18 2322)  fentaNYL (SUBLIMAZE) injection 50 mcg (  50 mcg Intravenous Given 09/09/18 1715)    Mobility walks Low fall risk   Focused Assessments Neuro Assessment Handoff:  Swallow screen pass? No  Cardiac Rhythm: Normal sinus rhythm NIH Stroke Scale ( + Modified Stroke Scale Criteria)  Interval: Initial Level of Consciousness (1a.)   : Alert, keenly responsive LOC Questions (1b. )   +: Answers both questions correctly LOC Commands (1c. )   + : Performs both tasks correctly Best Gaze (2. )  +: Normal Visual (3. )  +: Partial hemianopia Facial Palsy (4. )    : Normal symmetrical movements Motor Arm, Left (5a. )   +: No drift Motor Arm, Right (5b. )   +: No drift Motor Leg, Left (6a. )   +: No drift Motor Leg, Right (6b. )   +: No drift Limb Ataxia (7. ): Absent Sensory (8. )   +: Mild-to-moderate sensory loss, patient feels pinprick is less sharp or is dull on the affected side, or there is a loss of superficial pain with pinprick, but patient is aware of being touched(pt endorses slight left sided numbness, residual from previous stroke) Best Language (9. )   +: No aphasia Dysarthria (10. ): Normal Extinction/Inattention (11.)   +: No Abnormality Modified SS Total  +: 2 Complete NIHSS TOTAL: 2     Neuro Assessment: Exceptions to WDL Neuro Checks:   Initial (09/09/18 1630)  Last Documented NIHSS Modified Score: 2 (09/09/18 1630) Has TPA been  given? No If patient is a Neuro Trauma and patient is going to OR before floor call report to [place] nurse: [phone] or [phone]     R Recommendations: See Admitting Provider Note  Report given to:   Additional Notes:

## 2018-09-11 ENCOUNTER — Other Ambulatory Visit: Payer: Self-pay | Admitting: Cardiology

## 2018-09-11 DIAGNOSIS — N183 Chronic kidney disease, stage 3 (moderate): Secondary | ICD-10-CM

## 2018-09-11 DIAGNOSIS — E1159 Type 2 diabetes mellitus with other circulatory complications: Secondary | ICD-10-CM

## 2018-09-11 DIAGNOSIS — I63431 Cerebral infarction due to embolism of right posterior cerebral artery: Secondary | ICD-10-CM

## 2018-09-11 DIAGNOSIS — I10 Essential (primary) hypertension: Secondary | ICD-10-CM

## 2018-09-11 DIAGNOSIS — I350 Nonrheumatic aortic (valve) stenosis: Secondary | ICD-10-CM

## 2018-09-11 DIAGNOSIS — E785 Hyperlipidemia, unspecified: Secondary | ICD-10-CM

## 2018-09-11 LAB — CBC
HCT: 32.9 % — ABNORMAL LOW (ref 36.0–46.0)
Hemoglobin: 10.5 g/dL — ABNORMAL LOW (ref 12.0–15.0)
MCH: 30 pg (ref 26.0–34.0)
MCHC: 31.9 g/dL (ref 30.0–36.0)
MCV: 94 fL (ref 80.0–100.0)
Platelets: 196 10*3/uL (ref 150–400)
RBC: 3.5 MIL/uL — ABNORMAL LOW (ref 3.87–5.11)
RDW: 13.4 % (ref 11.5–15.5)
WBC: 9.9 10*3/uL (ref 4.0–10.5)
nRBC: 0 % (ref 0.0–0.2)

## 2018-09-11 LAB — BASIC METABOLIC PANEL
Anion gap: 10 (ref 5–15)
BUN: 41 mg/dL — ABNORMAL HIGH (ref 8–23)
CO2: 27 mmol/L (ref 22–32)
Calcium: 9.1 mg/dL (ref 8.9–10.3)
Chloride: 100 mmol/L (ref 98–111)
Creatinine, Ser: 3.68 mg/dL — ABNORMAL HIGH (ref 0.44–1.00)
GFR calc Af Amer: 13 mL/min — ABNORMAL LOW (ref >60)
GFR calc non Af Amer: 11 mL/min — ABNORMAL LOW (ref >60)
Glucose, Bld: 107 mg/dL — ABNORMAL HIGH (ref 70–99)
Potassium: 4.3 mmol/L (ref 3.5–5.1)
Sodium: 137 mmol/L (ref 135–145)

## 2018-09-11 LAB — GLUCOSE, CAPILLARY
Glucose-Capillary: 111 mg/dL — ABNORMAL HIGH (ref 70–99)
Glucose-Capillary: 137 mg/dL — ABNORMAL HIGH (ref 70–99)
Glucose-Capillary: 198 mg/dL — ABNORMAL HIGH (ref 70–99)
Glucose-Capillary: 135 mg/dL — ABNORMAL HIGH (ref 70–99)

## 2018-09-11 LAB — MAGNESIUM: Magnesium: 2.1 mg/dL (ref 1.7–2.4)

## 2018-09-11 NOTE — Consult Note (Addendum)
ELECTROPHYSIOLOGY CONSULT NOTE  Patient ID: [name] MRN: [id number], DOB/AGE: 1940/01/26   Admit date: 09/09/2018 Date of Consult: 09/11/2018  Primary Physician: Merrilee Seashore, MD Primary Cardiologist: Virgina Jock Reason for Consultation: Cryptogenic stroke; recommendations regarding Implantable Loop Recorder  History of Present Illness EP has been asked to evaluate [name] for placement of an implantable loop recorder to monitor for atrial fibrillation by Dr Erlinda Hong.  The patient was admitted on 09/09/2018 with headache and vision loss.   Imaging demonstrated right PCA infract felt to be embolic 2/2 unclear source.  she has undergone workup for stroke including echocardiogram and carotid dopplers.  The patient has been monitored on telemetry which has demonstrated sinus rhythm with no arrhythmias.    She has CKD with fistula placed but is not yet on HD.   Echocardiogram this admission demonstrated EF 60-65%, severe asymmetric LVH with mild concentric LVH, moderate AR, severe AS, LA 42.  Lab work is reviewed.  Prior to admission, the patient denies chest pain, shortness of breath, dizziness, palpitations, or syncope.  They are recovering from their stroke with plans to return home at discharge.    Past Medical History:  Diagnosis Date   Anginal pain ([place])    Arthritis    Cerebrovascular disease    CKD (chronic kidney disease)    Sees Dr Deterding   Coronary artery disease    Depression    Diabetes ([place])    INSULIN DEPENDENT   Diabetic peripheral neuropathy ([place])    Diabetic retinopathy ([place])    Diverticulitis    Dyslipidemia    GERD (gastroesophageal reflux disease)    Headache(784.0) 01/30/2013   Heart murmur    History of cerebrovascular accident 01/30/2013   Hyperlipidemia    Hypertension    Migraine    Obesity    Stroke ([place])    x 2 no residulal   TIA (transient ischemic attack)    " MULTIPLE PER PATIENT "; had left sided weakness-  rehab and wants without assistance     Surgical History:  Past Surgical History:  Procedure Laterality Date   ABDOMINAL HYSTERECTOMY     AV FISTULA PLACEMENT Left 12/18/2017   Procedure: ARTERIOVENOUS (AV) FISTULA CREATION ARM;  Surgeon: Marty Heck, MD;  Location: [facility];  Service: Vascular;  Laterality: Left;   bilateral cataract surgery     CARDIAC CATHETERIZATION  01/07/2014   DR Einar Gip   COLON RESECTION  02/1999   COLONOSCOPY     COLOSTOMY  02/1999   COLOSTOMY CLOSURE  07/1999   EYE SURGERY Left 2019   FISTULA SUPERFICIALIZATION Left 06/29/2018   Procedure: FISTULA SUPERFICIALIZATION LEFT BRACHIOCEPHALIC;  Surgeon: Marty Heck, MD;  Location: [facility];  Service: Vascular;  Laterality: Left;   LEFT HEART CATHETERIZATION WITH CORONARY ANGIOGRAM N/A 01/07/2014   Procedure: LEFT HEART CATHETERIZATION WITH CORONARY ANGIOGRAM;  Surgeon: Laverda Page, MD;  Location: [facility] CATH LAB;  Service: Cardiovascular;  Laterality: N/A;   middle cerebral artery stent placement Right    OTHER SURGICAL HISTORY     laser surgery   PTCA  01/07/2014   DES to RCA    DR Einar Gip   right knee surgery Right    for infection     Medications Prior to Admission  Medication Sig Dispense Refill Last Dose   acetaminophen (TYLENOL) 325 MG tablet Take 650 mg by mouth every 6 (six) hours as needed (pain/headaches.).   Past Month at Unknown time   allopurinol (ZYLOPRIM) 100  MG tablet Take 50 mg by mouth daily.   09/09/2018 at Unknown time   amLODipine (NORVASC) 10 MG tablet Take 10 mg by mouth daily.   09/09/2018 at Unknown time   aspirin EC 81 MG tablet Take 81 mg by mouth daily.   09/09/2018 at Unknown time   B-D INS SYR ULTRAFINE 1CC/30G 30G X 1/2" 1 ML MISC Inject 1 each into the skin 3 (three) times daily between meals.   09/09/2018 at Unknown time   calcitRIOL (ROCALTROL) 0.25 MCG capsule Take 0.5 mcg by mouth daily.   09/09/2018 at Unknown time   calcium carbonate (OSCAL) 1500  (600 Ca) MG TABS tablet Take 600 mg by mouth daily.   09/09/2018 at Unknown time   Ferrous Sulfate (IRON) 142 (45 Fe) MG TBCR Take 142 mg by mouth 2 (two) times daily.   09/09/2018 at Unknown time   folic acid (FOLVITE) 1 MG tablet Take 1 mg by mouth daily.   09/09/2018 at Unknown time   HUMALOG KWIKPEN 100 UNIT/ML KwikPen 4-6 Units by Subconjunctival route 3 (three) times daily before meals. Sliding Scale Insulin   09/09/2018 at Unknown time   insulin glargine (LANTUS) 100 UNIT/ML injection Inject 12 Units into the skin at bedtime.    09/08/2018 at Unknown time   labetalol (NORMODYNE) 300 MG tablet Take 1 tablet by mouth twice daily (Patient taking differently: Take 300 mg by mouth 2 (two) times daily. ) 60 tablet 11 09/09/2018 at Unknown time   leflunomide (ARAVA) 10 MG tablet Take 10 mg by mouth daily.   09/09/2018 at Unknown time   lisinopril (ZESTRIL) 5 MG tablet Take 5 mg by mouth daily.   09/09/2018 at Unknown time   Magnesium 250 MG TABS Take 250 mg by mouth daily.   09/09/2018 at Unknown time   niacin (NIASPAN) 500 MG CR tablet Take 500 mg by mouth daily.    09/09/2018 at Unknown time   nortriptyline (PAMELOR) 10 MG capsule Take 10 mg by mouth daily.   09/09/2018 at Unknown time   omeprazole (PRILOSEC) 40 MG capsule Take 40 mg by mouth daily.   09/09/2018 at Unknown time   ONE TOUCH ULTRA TEST test strip 1 each by Other route 3 (three) times daily between meals.   09/09/2018 at Unknown time   oxyCODONE-acetaminophen (PERCOCET) 5-325 MG tablet Take 1 tablet by mouth every 6 (six) hours as needed for severe pain. 12 tablet 0 09/09/2018 at Unknown time   polyvinyl alcohol (TEARS AGAIN) 1.4 % ophthalmic solution Place 1 drop into both eyes 3 (three) times daily as needed for dry eyes.   09/09/2018 at Unknown time   PRISTIQ 100 MG 24 hr tablet Take 100 mg by mouth daily.    09/09/2018 at Unknown time   rosuvastatin (CRESTOR) 20 MG tablet Take 20 mg by mouth daily.   09/09/2018 at Unknown time    torsemide (DEMADEX) 20 MG tablet Take 20 mg by mouth daily.   09/09/2018 at Unknown time   nitroGLYCERIN (NITROSTAT) 0.4 MG SL tablet PLACE 1 TABLET (0.4 MG TOTAL) UNDER THE TONGUE EVERY 5 MINUTES X 3 DOSES AS NEEDED FOR CHEST PAIN. (Patient not taking: No sig reported) 25 tablet 1 Not Taking at Unknown time    Inpatient Medications:    stroke: mapping our early stages of recovery book   Does not apply Once   allopurinol  50 mg Oral Daily   aspirin EC  81 mg Oral Daily   calcitRIOL  0.5  mcg Oral Daily   calcium carbonate  1 tablet Oral Q breakfast   clopidogrel  75 mg Oral Daily   ferrous sulfate  325 mg Oral BID   folic acid  1 mg Oral Daily   heparin  5,000 Units Subcutaneous Q8H   insulin aspart  0-9 Units Subcutaneous TID WC   insulin glargine  12 Units Subcutaneous QHS   leflunomide  10 mg Oral Daily   magnesium oxide  200 mg Oral Daily   niacin  500 mg Oral Daily   nortriptyline  10 mg Oral QHS   pantoprazole  40 mg Oral Daily   rosuvastatin  20 mg Oral q1800   torsemide  20 mg Oral Daily   venlafaxine XR  150 mg Oral Q breakfast    Allergies: No Known Allergies  Social History   Socioeconomic History   Marital status: Widowed    Spouse name: Not on file   Number of children: 2   Years of education: PHD   Highest education level: Not on file  Occupational History   Not on file  Social Needs   Financial resource strain: Not on file   Food insecurity    Worry: Not on file    Inability: Not on file   Transportation needs    Medical: Not on file    Non-medical: Not on file  Tobacco Use   Smoking status: Never Smoker   Smokeless tobacco: Never Used  Substance and Sexual Activity   Alcohol use: No   Drug use: No   Sexual activity: Not on file  Lifestyle   Physical activity    Days per week: Not on file    Minutes per session: Not on file   Stress: Not on file  Relationships   Social connections    Talks on phone: Not on  file    Gets together: Not on file    Attends religious service: Not on file    Active member of club or organization: Not on file    Attends meetings of clubs or organizations: Not on file    Relationship status: Not on file   Intimate partner violence    Fear of current or ex partner: Not on file    Emotionally abused: Not on file    Physically abused: Not on file    Forced sexual activity: Not on file  Other Topics Concern   Not on file  Social History Narrative   Patient is widowed and lives with her two adopted children.   Patient is retired.   Patient has two children of her own and two adopted children.   Patient has a PHD.   Patient is right-handed.     Family History  Problem Relation Age of Onset   Diabetes Mother    Heart disease Mother    Prostate cancer Father    Hypertension Brother    Prostate cancer Brother       Review of Systems: All other systems reviewed and are otherwise negative except as noted above.  Physical Exam: Vitals:   09/10/18 2350 09/11/18 0156 09/11/18 0400 09/11/18 0730  BP: (!) 135/41 (!) 141/40 (!) 137/34 (!) 153/49  Pulse: 66 67 70 76  Resp: 18 20 18 17   Temp: 98.5 F (36.9 C) 98.1 F (36.7 C) 98.5 F (36.9 C) (!) 97.5 F (36.4 C)  TempSrc: Oral Oral Oral Oral  SpO2: 98% 100% 99% 100%  Weight:   75.6 kg   Height:  GEN- The patient is well appearing, alert and oriented x 3 today.   Head- normocephalic, atraumatic Eyes-  Sclera clear, conjunctiva pink Ears- hearing intact Oropharynx- clear Neck- supple Lungs- Clear to ausculation bilaterally, normal work of breathing Heart- Regular rate and rhythm, no murmurs, rubs or gallops  GI- soft, NT, ND, + BS Extremities- no clubbing, cyanosis, or edema MS- no significant deformity or atrophy Skin- no rash or lesion Psych- euthymic mood, full affect   Labs:   Lab Results  Component Value Date   WBC 9.9 09/11/2018   HGB 10.5 (L) 09/11/2018   HCT 32.9 (L)  09/11/2018   MCV 94.0 09/11/2018   PLT 196 09/11/2018    Recent Labs  Lab 09/09/18 1639  09/11/18 0348  NA 138   < > 137  K 4.4   < > 4.3  CL 100   < > 100  CO2 25   < > 27  BUN 39*   < > 41*  CREATININE 3.72*   < > 3.68*  CALCIUM 10.1   < > 9.1  PROT 6.8  --   --   BILITOT 0.7  --   --   ALKPHOS 72  --   --   ALT 19  --   --   AST 25  --   --   GLUCOSE 194*   < > 107*   < > = values in this interval not displayed.     Radiology/Studies: Ct Head Wo Contrast  Result Date: 09/09/2018 CLINICAL DATA:  78 year old female with severe headache onset this morning and loss of vision. EXAM: CT HEAD WITHOUT CONTRAST TECHNIQUE: Contiguous axial images were obtained from the base of the skull through the vertex without intravenous contrast. COMPARISON:  Brain MRI 07/17/2010. Head CT 07/22/2009. FINDINGS: Brain: Chronic encephalomalacia in the posterosuperior right frontal lobe appears not significantly changed since 2012. However, there is a 3 centimeter area of hypodensity most compatible with cytotoxic edema in the right occipital lobe on series 3, image 12. this area was normal in 2012. No associated hemorrhage. No significant mass effect. Elsewhere gray-white matter differentiation is within normal limits. No midline shift, ventriculomegaly, mass effect, evidence of mass lesion. Vascular: Calcified atherosclerosis at the skull base. There is also a chronic right MCA M1 stent vascular stent visible on series 6, image 24. No suspicious intracranial vascular hyperdensity. Skull: No acute osseous abnormality identified. Sinuses/Orbits: Visualized paranasal sinuses and mastoids are stable and well pneumatized. Other: Postoperative changes to both globes. No acute orbit or scalp soft tissue finding. IMPRESSION: 1. Positive for acute to subacute Right PCA territory infarct affecting the right occipital lobe. No associated hemorrhage or mass effect. 2. No other acute intracranial abnormality. Chronic  posterior right MCA territory infarct and right M1 vascular stent. Electronically Signed   By: Genevie Ann [name]   On: 09/09/2018 17:02   Mr Angio Head Wo Contrast  Result Date: 09/10/2018 CLINICAL DATA:  Focal neuro deficit greater than 6 hours. Suspect stroke EXAM: MRI HEAD WITHOUT CONTRAST MRA HEAD WITHOUT CONTRAST TECHNIQUE: Multiplanar, multiecho pulse sequences of the brain and surrounding structures were obtained without intravenous contrast. Angiographic images of the head were obtained using MRA technique without contrast. COMPARISON:  CT head 09/09/2018.  MRI 07/17/2010 FINDINGS: MRI HEAD FINDINGS Brain: Acute infarct right occipital lobe in the inferior portion. No other acute infarct identified. Mild atrophy. Negative for hydrocephalus. Chronic infarct in the right posterior frontal lobe. Chronic microhemorrhage in the right parietal white  matter. Vascular: Stent in the right M1 segment causing artifact. Otherwise normal arterial flow voids at the skull base. Skull and upper cervical spine: Negative Sinuses/Orbits: Mild mucosal edema paranasal sinuses. Bilateral cataract surgery. Other: None MRA HEAD FINDINGS Both vertebral arteries patent to the basilar. Basilar widely patent. PICA not visualized. Prominent right AICA patent. Fetal origin left posterior cerebral artery. Branch occlusion of left P3 branch. Mild stenosis right P2 segment. Right P3 segments patent without significant stenosis. Internal carotid artery patent bilaterally. Anterior cerebral arteries widely patent bilaterally. Mild stenosis left M1 segment. Left MCA branches widely patent. Loss of flow related signal right middle cerebral artery throughout its course due to artifact from stent. Distal flow evaluation not adequately assessed by this study. IMPRESSION: 1. Acute infarct right occipital lobe. No associated hemorrhage in the infarct. Chronic microhemorrhage right parietal white matter appears separate. 2. Right M1 stent obscures  evaluation of the right middle cerebral artery on MRA 3. Right posterior cerebral artery patent. 4. Occluded distal P3 branch on the left. Electronically Signed   By: Franchot Gallo [name]   On: 09/10/2018 18:05   Mr Brain Wo Contrast  Result Date: 09/10/2018 CLINICAL DATA:  Focal neuro deficit greater than 6 hours. Suspect stroke EXAM: MRI HEAD WITHOUT CONTRAST MRA HEAD WITHOUT CONTRAST TECHNIQUE: Multiplanar, multiecho pulse sequences of the brain and surrounding structures were obtained without intravenous contrast. Angiographic images of the head were obtained using MRA technique without contrast. COMPARISON:  CT head 09/09/2018.  MRI 07/17/2010 FINDINGS: MRI HEAD FINDINGS Brain: Acute infarct right occipital lobe in the inferior portion. No other acute infarct identified. Mild atrophy. Negative for hydrocephalus. Chronic infarct in the right posterior frontal lobe. Chronic microhemorrhage in the right parietal white matter. Vascular: Stent in the right M1 segment causing artifact. Otherwise normal arterial flow voids at the skull base. Skull and upper cervical spine: Negative Sinuses/Orbits: Mild mucosal edema paranasal sinuses. Bilateral cataract surgery. Other: None MRA HEAD FINDINGS Both vertebral arteries patent to the basilar. Basilar widely patent. PICA not visualized. Prominent right AICA patent. Fetal origin left posterior cerebral artery. Branch occlusion of left P3 branch. Mild stenosis right P2 segment. Right P3 segments patent without significant stenosis. Internal carotid artery patent bilaterally. Anterior cerebral arteries widely patent bilaterally. Mild stenosis left M1 segment. Left MCA branches widely patent. Loss of flow related signal right middle cerebral artery throughout its course due to artifact from stent. Distal flow evaluation not adequately assessed by this study. IMPRESSION: 1. Acute infarct right occipital lobe. No associated hemorrhage in the infarct. Chronic microhemorrhage  right parietal white matter appears separate. 2. Right M1 stent obscures evaluation of the right middle cerebral artery on MRA 3. Right posterior cerebral artery patent. 4. Occluded distal P3 branch on the left. Electronically Signed   By: Franchot Gallo [name]   On: 09/10/2018 18:05   Dg Chest Port 1 View  Result Date: 09/10/2018 CLINICAL DATA:  78 year old female with increasing shortness of breath. EXAM: PORTABLE CHEST 1 VIEW COMPARISON:  None. FINDINGS: There is mild cardiomegaly with vascular congestion and edema. Small bilateral pleural effusions with associated bibasilar atelectasis. Pneumonia is not excluded. No pneumothorax. No acute osseous pathology. IMPRESSION: Mild cardiomegaly with findings of CHF and small bilateral pleural effusions. Electronically Signed   By: Anner Crete [name]   On: 09/10/2018 02:09   Vas US Carotid  Result Date: 09/11/2018 Carotid Arterial Duplex Study Indications:       CVA. Risk Factors:      None.  Limitations        Today's exam was limited due to the high bifurcation of the                    carotid and Vessel tortuosity. Comparison Study:  No prior studies. Performing Technologist: Oliver Hum RVT  Examination Guidelines: A complete evaluation includes B-mode imaging, spectral Doppler, color Doppler, and power Doppler as needed of all accessible portions of each vessel. Bilateral testing is considered an integral part of a complete examination. Limited examinations for reoccurring indications may be performed as noted.  Right Carotid Findings: +----------+--------+-------+--------+--------------------------------+--------+             PSV cm/s EDV     Stenosis Plaque Description               Comments                       cm/s                                                        +----------+--------+-------+--------+--------------------------------+--------+  CCA Prox   62       0                smooth and heterogenous          tortuous   +----------+--------+-------+--------+--------------------------------+--------+  CCA Distal 43       6                smooth and heterogenous                    +----------+--------+-------+--------+--------------------------------+--------+  ICA Prox   39       5                smooth, heterogenous and                                                         calcific                                   +----------+--------+-------+--------+--------------------------------+--------+  ICA Distal 45       7                                                 tortuous  +----------+--------+-------+--------+--------------------------------+--------+  ECA        35       6                                                           +----------+--------+-------+--------+--------------------------------+--------+ +----------+--------+-------+--------+-------------------+             PSV cm/s EDV cms Describe Arm Pressure (mmHG)  +----------+--------+-------+--------+-------------------+  Subclavian 112                                            +----------+--------+-------+--------+-------------------+ +---------+--------+--+--------+-+---------+  Vertebral PSV cm/s 57 EDV cm/s 8 Antegrade  +---------+--------+--+--------+-+---------+   Left Carotid Findings: +----------+--------+--------+--------+-----------------------+--------+             PSV cm/s EDV cm/s Stenosis Plaque Description      Comments  +----------+--------+--------+--------+-----------------------+--------+  CCA Prox   53       0                 smooth and heterogenous tortuous  +----------+--------+--------+--------+-----------------------+--------+  CCA Distal 54       5                 smooth and heterogenous           +----------+--------+--------+--------+-----------------------+--------+  ICA Prox   94       9                 smooth and heterogenous tortuous  +----------+--------+--------+--------+-----------------------+--------+  ICA Distal 107      14                                         tortuous  +----------+--------+--------+--------+-----------------------+--------+  ECA        84       0                                                   +----------+--------+--------+--------+-----------------------+--------+ +----------+--------+--------+--------+-------------------+  Subclavian PSV cm/s EDV cm/s Describe Arm Pressure (mmHG)  +----------+--------+--------+--------+-------------------+             193      38                                     +----------+--------+--------+--------+-------------------+ +---------+--------+--+--------+-+---------+  Vertebral PSV cm/s 48 EDV cm/s 6 Antegrade  +---------+--------+--+--------+-+---------+   Summary: Right Carotid: Velocities in the right ICA are consistent with a 1-39% stenosis. Left Carotid: Velocities in the left ICA are consistent with a 1-39% stenosis. Vertebrals: Bilateral vertebral arteries demonstrate antegrade flow. *See table(s) above for measurements and observations.  Electronically signed by Antony Contras MD on 09/11/2018 at 8:15:19 AM.    Final    Vas [name] Lower Extremity Venous (dvt)  Result Date: 09/10/2018  Lower Venous Study Indications: CVA.  Risk Factors: None identified. Comparison Study: No prior studies. Performing Technologist: Oliver Hum RVT  Examination Guidelines: A complete evaluation includes B-mode imaging, spectral Doppler, color Doppler, and power Doppler as needed of all accessible portions of each vessel. Bilateral testing is considered an integral part of a complete examination. Limited examinations for reoccurring indications may be performed as noted.  +---------+---------------+---------+-----------+----------+--------------+  RIGHT     Compressibility Phasicity Spontaneity Properties Thrombus Aging  +---------+---------------+---------+-----------+----------+--------------+  CFV       Full            Yes       Yes                                     +---------+---------------+---------+-----------+----------+--------------+  SFJ       Full                                                             +---------+---------------+---------+-----------+----------+--------------+  FV Prox   Full                                                             +---------+---------------+---------+-----------+----------+--------------+  FV Mid    Full                                                             +---------+---------------+---------+-----------+----------+--------------+  FV Distal Full                                                             +---------+---------------+---------+-----------+----------+--------------+  PFV       Full                                                             +---------+---------------+---------+-----------+----------+--------------+  POP       Full            Yes       Yes                                    +---------+---------------+---------+-----------+----------+--------------+  PTV       Full                                                             +---------+---------------+---------+-----------+----------+--------------+  PERO      Full                                                             +---------+---------------+---------+-----------+----------+--------------+   +---------+---------------+---------+-----------+----------+--------------+  LEFT      Compressibility Phasicity Spontaneity Properties Thrombus Aging  +---------+---------------+---------+-----------+----------+--------------+  CFV       Full            Yes       Yes                                    +---------+---------------+---------+-----------+----------+--------------+  SFJ       Full                                                             +---------+---------------+---------+-----------+----------+--------------+  FV Prox   Full                                                              +---------+---------------+---------+-----------+----------+--------------+  FV Mid    Full                                                             +---------+---------------+---------+-----------+----------+--------------+  FV Distal Full                                                             +---------+---------------+---------+-----------+----------+--------------+  PFV       Full                                                             +---------+---------------+---------+-----------+----------+--------------+  POP       Full            Yes       Yes                                    +---------+---------------+---------+-----------+----------+--------------+  PTV       Full                                                             +---------+---------------+---------+-----------+----------+--------------+  PERO      Full                                                             +---------+---------------+---------+-----------+----------+--------------+     Summary: Right: There is no evidence of deep vein thrombosis in the lower extremity. No cystic structure found in the popliteal fossa. Left: There is no evidence of deep vein thrombosis in the lower extremity. No cystic structure found in the popliteal fossa.  *See table(s) above for measurements and observations. Electronically signed by Ruta Hinds MD on 09/10/2018 at 6:53:43 PM.    Final     12-lead ECG sinus rhythm (personally reviewed) All prior EKG's in EPIC reviewed with no documented atrial fibrillation  Telemetry SR, PVC's (personally reviewed)  Assessment and Plan:  1. Cryptogenic stroke The patient presents with cryptogenic stroke.  EP has been asked to evaluate for ILR implant to evaluate for atrial fibrillation as  a cause. See below   2.  Severe AS Dr Letitia Libra note reviewed. With need for further work up and possible intervention for AS, would recommend 30 day monitor at this time. Discussed with Dr Virgina Jock  today. His office will arrange. We are happy to implant ILR down the road if needed.   Please call with questions.   Chanetta Marshall, NP 09/11/2018 11:14 AM  I have seen, examined the patient, and reviewed the above assessment and plan.  Changes to above are made where necessary.  On exam, RRR.  Plans for AS workup are noted.  I would advise 30 day monitor and follow-up with Dr Virgina Jock.   Electrophysiology team to see as needed while here. Please call with questions.   Co Sign: Thompson Grayer, MD

## 2018-09-11 NOTE — Evaluation (Signed)
Occupational Therapy Evaluation Patient Details Name: [name] MRN: [id number] DOB: 22-Sep-1940 Today's Date: 09/11/2018    History of Present Illness 78 year old female with history of previous stroke, depression, DM-2, peripheral neuropathy, CAD, left eye cataract surgery and migraine presenting with acute vision loss mainly on the left, and posterior throbbing headache radiating to her right temple.  Reportedly had DBP in 30s and 40s at home. Admitted for treatment of Acute right PICA territory CVA/left hemianopsia and Diastolic CHF exacerbation 09/09/18   Clinical Impression   This 78 yo female admitted with above presents to acute OT with decreased balance and left homonymous heminaposia thus affecting her safety and independence with basic ADLs. Pt is compensating well on her own for vision deficit with reading. We will continue to follow.    Follow Up Recommendations  Outpatient OT;Other (comment)(24 hour S/prn A initially until it is felt safe for pt to be alone)    Equipment Recommendations  None recommended by OT       Precautions / Restrictions Precautions Precaution Comments: left homonymous heminaposia---appears upper quadrant is more involved than lower Restrictions Weight Bearing Restrictions: No      Mobility Bed Mobility Overal bed mobility: Independent                Transfers Overall transfer level: Needs assistance Equipment used: None Transfers: Sit to/from Stand Sit to Stand: Min guard         General transfer comment: ambulation Minguard due to left calf pain    Balance Overall balance assessment: Mild deficits observed, not formally tested                                         ADL either performed or assessed with clinical judgement   ADL Overall ADL's : Needs assistance/impaired Eating/Feeding: Independent   Grooming: Min guard;Standing   Upper Body Bathing: Set up;Sitting   Lower Body Bathing: Min guard;Sit  to/from stand   Upper Body Dressing : Set up;Sitting   Lower Body Dressing: Min guard;Sit to/from stand   Toilet Transfer: Min guard;Ambulation;Regular Museum/gallery exhibitions officer and Hygiene: Min guard;Sit to/from stand               Vision Baseline Vision/History: Wears glasses Wears Glasses: Reading only(2.75) Patient Visual Report: Peripheral vision impairment Vision Assessment?: Yes Eye Alignment: Within Functional Limits Ocular Range of Motion: Within Functional Limits Alignment/Gaze Preference: Within Defined Limits Tracking/Visual Pursuits: Able to track stimulus in all quads without difficulty Convergence: Within functional limits Visual Fields: Left homonymous hemianopsia(upper quadrant seems more involved than lower) Additional Comments: Pt talks with head slighlty tilted to left. Pt able to read with additional shift of eyes and slight head tilt--self compensating            Pertinent Vitals/Pain Pain Assessment: Faces Faces Pain Scale: Hurts little more Pain Location: left calf Pain Descriptors / Indicators: Sore Pain Intervention(s): Limited activity within patient's tolerance;Monitored during session(Spoke to MD (neurology) and DVT was ruled out)     Hand Dominance Right   Extremity/Trunk Assessment Upper Extremity Assessment Upper Extremity Assessment: Overall WFL for tasks assessed           Communication Communication Communication: No difficulties   Cognition Arousal/Alertness: Awake/alert Behavior During Therapy: WFL for tasks assessed/performed Overall Cognitive Status: Within Functional Limits for tasks assessed  Home Living Family/patient expects to be discharged to:: Private residence Living Arrangements: Alone Available Help at Discharge: Family;Available 24 hours/day Type of Home: House             Bathroom Shower/Tub: Animal nutritionist: Standard         Additional Comments: Last child went off to college this year. Retired Magazine features editor With: Alone    Prior Functioning/Environment Level of Independence: Independent                 OT Problem List: Impaired balance (sitting and/or standing);Impaired vision/perception      OT Treatment/Interventions: Self-care/ADL training;Visual/perceptual remediation/compensation;Patient/family education;Balance training    OT Goals(Current goals can be found in the care plan section) Acute Rehab OT Goals Patient Stated Goal: get vision back to normal OT Goal Formulation: With patient Time For Goal Achievement: 09/25/18 Potential to Achieve Goals: Good  OT Frequency: Min 2X/week              AM-PAC OT "6 Clicks" Daily Activity     Outcome Measure Help from another person eating meals?: None Help from another person taking care of personal grooming?: A Little Help from another person toileting, which includes using toliet, bedpan, or urinal?: A Little Help from another person bathing (including washing, rinsing, drying)?: A Little Help from another person to put on and taking off regular upper body clothing?: A Little Help from another person to put on and taking off regular lower body clothing?: A Little 6 Click Score: 19   End of Session Equipment Utilized During Treatment: Gait belt  Activity Tolerance: Patient tolerated treatment well Patient left: in bed;with call bell/phone within reach;with bed alarm set  OT Visit Diagnosis: Unsteadiness on feet (R26.81);Low vision, both eyes (H54.2)                Time: [id number] OT Time Calculation (min): 27 min Charges:  OT General Charges $OT Visit: 1 Visit OT Evaluation $OT Eval Moderate Complexity: 1 Mod OT Treatments $Self Care/Home Management : 8-22 mins  [place] [address], OTR/L Acute [employer] Pager [phone] Office [phone]     Almon Register 09/11/2018, 10:06  AM

## 2018-09-11 NOTE — Evaluation (Signed)
Physical Therapy Evaluation Patient Details Name: [name] MRN: [id number] DOB: 11/08/1940 Today's Date: 09/11/2018   History of Present Illness  78 year old female with history of previous stroke, depression, DM-2, peripheral neuropathy, CAD, left eye cataract surgery and migraine presenting with acute vision loss mainly on the left, and posterior throbbing headache radiating to her right temple.  Reportedly had DBP in 30s and 40s at home. Admitted for treatment of Acute right PICA territory CVA/left hemianopsia and Diastolic CHF exacerbation 09/09/18  Clinical Impression  PTA pt living independently in 2 story home with steps to enter. Pt is limited in safe mobility by residual L sided weakness and L hemianopsia, affecting balance and coordination. Pt min guard for transfers, supervision for ambulation and min guard for stairs. PT recommending Outpatient Neuro PT for work on high level balance activity. Pt is hopeful for d/c today, however PT will continue to follow acutely while admitted.     Follow Up Recommendations Outpatient PT;Supervision/Assistance - 24 hour;Other (comment)(Neuro PT/ assist on intial d/c )    Equipment Recommendations  None recommended by PT       Precautions / Restrictions Precautions Precaution Comments: left homonymous heminaposia---appears upper quadrant is more involved than lower Restrictions Weight Bearing Restrictions: No      Mobility  Bed Mobility Overal bed mobility: Independent             General bed mobility comments: OOB in recliner  Transfers Overall transfer level: Needs assistance Equipment used: None Transfers: Sit to/from Stand Sit to Stand: Min guard         General transfer comment: ambulation Minguard due to left calf pain  Ambulation/Gait Ambulation/Gait assistance: Supervision Gait Distance (Feet): 400 Feet Assistive device: None Gait Pattern/deviations: Step-through pattern;Decreased step length -  right;Decreased step length - left;Drifts right/left;Decreased dorsiflexion - left Gait velocity: slowed Gait velocity interpretation: <1.8 ft/sec, indicate of risk for recurrent falls General Gait Details: supervision for ambulation, decreased L foot dorsiflexion, requiring increased L knee flexion to progress, creating mild instability, no obvious effects of L visual field deficit with ambulation   Stairs Stairs: Yes Stairs assistance: Min guard Stair Management: One rail Right;One rail Left Number of Stairs: 12 General stair comments: hands on min guard for safety, decreased L sided quad strength caused knee to buckle with first step, educated on step to pattern ascending with R foot first and decending with L foot first  Wheelchair Mobility    Modified Rankin (Stroke Patients Only) Modified Rankin (Stroke Patients Only) Pre-Morbid Rankin Score: No significant disability Modified Rankin: No significant disability     Balance Overall balance assessment: Mild deficits observed, not formally tested                                           Pertinent Vitals/Pain Pain Assessment: No/denies pain    Home Living Family/patient expects to be discharged to:: Private residence Living Arrangements: Alone Available Help at Discharge: Family;Available 24 hours/day Type of Home: House       Home Layout: Two level   Additional Comments: Last child went off to college this year. Retired Tourist information centre manager    Prior Function Level of Independence: Independent               Hand Dominance   Dominant Hand: Right    Extremity/Trunk Assessment   Upper Extremity Assessment Upper Extremity Assessment: Defer to OT  evaluation    Lower Extremity Assessment Lower Extremity Assessment: LLE deficits/detail LLE Deficits / Details: decreased strength and sensation residual from prior CVA, strength grossly 4/5 LLE Sensation: decreased light touch;decreased proprioception LLE  Coordination: decreased fine motor       Communication   Communication: No difficulties  Cognition Arousal/Alertness: Awake/alert Behavior During Therapy: WFL for tasks assessed/performed Overall Cognitive Status: Within Functional Limits for tasks assessed                                        General Comments General comments (skin integrity, edema, etc.): VSS        Assessment/Plan    PT Assessment Patient needs continued PT services  PT Problem List Decreased strength;Decreased balance;Decreased mobility;Impaired sensation;Decreased coordination       PT Treatment Interventions DME instruction;Gait training;Stair training;Functional mobility training;Therapeutic activities;Therapeutic exercise;Balance training;Neuromuscular re-education;Cognitive remediation    PT Goals (Current goals can be found in the Care Plan section)  Acute Rehab PT Goals Patient Stated Goal: get vision back to normal PT Goal Formulation: With patient Time For Goal Achievement: 09/25/18 Potential to Achieve Goals: Fair    Frequency Min 4X/week    AM-PAC PT "6 Clicks" Mobility  Outcome Measure Help needed turning from your back to your side while in a flat bed without using bedrails?: None Help needed moving from lying on your back to sitting on the side of a flat bed without using bedrails?: None Help needed moving to and from a bed to a chair (including a wheelchair)?: A Little Help needed standing up from a chair using your arms (e.g., wheelchair or bedside chair)?: A Little Help needed to walk in hospital room?: A Little Help needed climbing 3-5 steps with a railing? : A Little 6 Click Score: 20    End of Session Equipment Utilized During Treatment: Gait belt Activity Tolerance: Patient tolerated treatment well Patient left: in chair;with call bell/phone within reach Nurse Communication: Mobility status PT Visit Diagnosis: Unsteadiness on feet (R26.81);Other  abnormalities of gait and mobility (R26.89);Muscle weakness (generalized) (M62.81);Difficulty in walking, not elsewhere classified (R26.2);Other symptoms and signs involving the nervous system (R29.898)    Time: 1442-1500 PT Time Calculation (min) (ACUTE ONLY): 18 min   Charges:   PT Evaluation $PT Eval Moderate Complexity: 1 Mod          Thedora Rings B. Migdalia Dk PT, DPT Acute Rehabilitation Services Pager [phone] Office [phone]   [place] 09/11/2018, 3:38 PM

## 2018-09-11 NOTE — Progress Notes (Signed)
  Speech Language Pathology Treatment: Dysphagia  Patient Details Name: [name] MRN: [id number] DOB: February 04, 1940 Today's Date: 09/11/2018 Time: 4469-5072 SLP Time Calculation (min) (ACUTE ONLY): 10 min  Assessment / Plan / Recommendation Clinical Impression  Pt consumed regular solids and thin liquids with no overt signs of dysphagia or aspiration. She had no subjective complaints of difficulty across meals so far. Recommend continuing with current diet - no texture restrictions for solids or liquids. SLP to sign off.   HPI HPI:  78 y.o. female with medical history significant of previous stroke, depression, diabetes, peripheral neuropathy, CAD, migraine.  Patient presented secondary to symptoms of vision loss in addition to significant posterior headache.  Patient states that she woke up this morning and noticed that she had vision loss when she tried to look at a picture and was only able to see half of the picture.  She had associated severe headache that she describes as throbbing and located posterior in addition to radiating down the right temporal area into the back of her eye. CT head significant for acute/subacute right PCA territory infarct affecting right occipital lobe      SLP Plan  All goals met       Recommendations  Diet recommendations: Regular;Thin liquid Liquids provided via: Cup;Straw Medication Administration: Whole meds with liquid Supervision: Patient able to self feed Compensations: Slow rate;Small sips/bites Postural Changes and/or Swallow Maneuvers: Seated upright 90 degrees                Oral Care Recommendations: Oral care BID Follow up Recommendations: None SLP Visit Diagnosis: Dysphagia, unspecified (R13.10) Plan: All goals met       GO                [name] [name] 09/11/2018, 3:22 PM  Pollyann Glen, M.A. [place] Acute Environmental education officer [phone] Office [phone]

## 2018-09-11 NOTE — Progress Notes (Signed)
Event

## 2018-09-11 NOTE — Progress Notes (Signed)
PROGRESS NOTE  [name] [id number] DOB: 07/23/1940   PCP: Merrilee Seashore, MD  Patient is from: Home.  DOA: 09/09/2018 LOS: 2  Brief Narrative / Interim history: 78 year old female with history of previous stroke, depression, DM-2, peripheral neuropathy, CAD, left eye cataract surgery and migraine presenting with acute vision loss mainly on the left, and posterior throbbing headache radiating to her right temple.  Reportedly had DBP in 30s and 40s at home.  In ED, BP 150/50.  CBG 194.  Creatinine 3.72.  CT head significant for acute/subacute right PCA territory infarct affecting the right occipital lobe.  Neurology started and patient was admitted for CVA.  Patient had acute respiratory distress with desaturation to 70% on 5 L requiring NRB.  She was given IV Lasix with improvement in her breathing and weaned down to 4 L by nasal cannula.  Currently saturating at 100%.  ABG not remarkable.  Assessment & Plan: Acute right PICA territory CVA/left hemianopsia/left paresthesia: Left hemianopsia improved.  Headache and paresthesia resolved.  Still with blurry vision in left eye. -MRI brain-acute right occipital infarct,  -MRA-right M1 stent obscuring right MCA, normal right PCA and occluded distal branch on left -TTE with EF of 60 to 65%, severe AS, no embolus -Received aspirin 325 mg on admission. -LE Doppler negative for DVT -Carotid Doppler without flow obstructing stenosis -Received full dose aspirin on admission. -DAPT (Plavix 75 mg and ASA 81 mg) daily for 3 weeks and then Plavix alone. -Permissive hypertension for 5 to 7 days. -Cardiology to arrange 30-day event monitor -Patient has low DBP likely from aortic stenosis which could potentially contribute -PT/OT/SLP-outpatient therapy -Appreciate neurology guidance-signed off.  Acute respiratory failure with hypoxia likely due to CHF exacerbation in the setting of severe aortic stenosis.  Resolved.  Currently on room  air. -Improved with IV Lasix.  Continue home torsemide. -Manage CHF exacerbation as below.  Diastolic CHF exacerbation/severe aortic stenosis: Echo with EF of 60 to 65%, severe asymmetric LVH, severe AS and moderately elevated RV pressure.  CXR concerning for CHF.  Lung exam with bibasilar crackles bilaterally.  Responded to IV Lasix and p.o. torsemide well. -Appreciate cardiology guidance-she is followed by Dr. Einar Gip. -Continue home torsemide.  -Daily weight, intake output and renal function. -Salt and fluid restriction. -Likely benefit from nephrology before work-up for TAVR due to her advanced CKD.  She is followed by Dr. Hollie Salk outpatient.  Hypertension: BP was in fair range. -Hold home medications for permissive hypertension  Controlled IDDM-2 with renal complication and neuropathy: A1c 7.6%.  CBG was in fair range. -Continue current regimen -Continue home Crestor. -Could benefit from GLP-1 inhibitors on discharge.  History of CAD: No chest pain but the dyspnea likely from underlying CHF and aortic stenosis. -Continue statin as above. -May benefit from permissive hypertension.  CKD-5/BMD: Renal function at baseline. -Continue monitoring -Continue home calcitriol and p.o. iron. -She could benefit from outpatient neurology follow-up.  History of rheumatoid arthritis/chronic pain: Stable -Continue home [place]. -PRN Tylenol and oxycodone.  Mood disorder: Stable -Continue home Pristiq and nortriptyline.  Obesity: BMI 31.75. -Could benefit from GLP-1 inhibitors given diabetes and cardiac history.  Chronic pain: -PRN Percocet and Tylenol.  DVT prophylaxis: Subcu heparin Code Status: DNR/DNI Family Communication: Patient and/or RN. Available if any question. Disposition Plan: Anticipate discharge in the next 24 hours once cleared by cardiology Consultants: Neurology, cardiology  Procedures:  None  Microbiology summarized: COVID-19 screen  negative.  Antimicrobials: Anti-infectives (From admission, onward)   None  Sch Meds:  Scheduled Meds:   stroke: mapping our early stages of recovery book   Does not apply Once   allopurinol  50 mg Oral Daily   aspirin EC  81 mg Oral Daily   calcitRIOL  0.5 mcg Oral Daily   calcium carbonate  1 tablet Oral Q breakfast   clopidogrel  75 mg Oral Daily   ferrous sulfate  325 mg Oral BID   folic acid  1 mg Oral Daily   heparin  5,000 Units Subcutaneous Q8H   insulin aspart  0-9 Units Subcutaneous TID WC   insulin glargine  12 Units Subcutaneous QHS   leflunomide  10 mg Oral Daily   magnesium oxide  200 mg Oral Daily   niacin  500 mg Oral Daily   nortriptyline  10 mg Oral QHS   pantoprazole  40 mg Oral Daily   rosuvastatin  20 mg Oral q1800   torsemide  20 mg Oral Daily   venlafaxine XR  150 mg Oral Q breakfast   Continuous Infusions: PRN Meds:.acetaminophen **OR** acetaminophen (TYLENOL) oral liquid 160 mg/5 mL **OR** acetaminophen, hydrALAZINE, ipratropium-albuterol, morphine injection, oxyCODONE-acetaminophen, senna-docusate   Subjective: No major events overnight of this morning.  Still with blurry vision in the left eye but improved.  Headache resolved.  Diastolic blood pressures in 30s and 40s overnight.  Dyspnea resolved.  She is currently on room air.  She denies chest pain.  No new focal neuro symptoms.  Objective: Vitals:   09/10/18 2350 09/11/18 0156 09/11/18 0400 09/11/18 0730  BP: (!) 135/41 (!) 141/40 (!) 137/34 (!) 153/49  Pulse: 66 67 70 76  Resp: 18 20 18 17   Temp: 98.5 F (36.9 C) 98.1 F (36.7 C) 98.5 F (36.9 C) (!) 97.5 F (36.4 C)  TempSrc: Oral Oral Oral Oral  SpO2: 98% 100% 99% 100%  Weight:   75.6 kg   Height:        Intake/Output Summary (Last 24 hours) at 09/11/2018 1358 Last data filed at 09/11/2018 0943 Gross per 24 hour  Intake --  Output 1700 ml  Net -1700 ml   Filed Weights   09/10/18 0918 09/11/18 0400   Weight: 78.7 kg 75.6 kg    Examination:  GENERAL: No acute distress.  Sitting on the edge of the bed. HEENT: MMM.  Vision and hearing grossly intact.  NECK: Supple.  No apparent JVD.  RESP:  No IWOB. Good air movement bilaterally. CVS:  RRR.  3/6 SEM over RUSB radiating to head next.  3/6 SEM over LUSB and LLSB ABD/GI/GU: Bowel sounds present. Soft. Non tender.  MSK/EXT:  Moves extremities. No apparent deformity or edema.  SKIN: no apparent skin lesion or wound NEURO: Awake, alert and oriented appropriately.  Speech clear.  Left hemianopsia peripherally.  Blurry vision in left eye when she covers right.  Motor, light sensation and patellar reflex symmetric.  No pronator drift.  [place]-to-nose intact. PSYCH: Calm. Normal affect.   I have personally reviewed the following labs and images: CBC: Recent Labs  Lab 09/09/18 1639 09/09/18 1655 09/10/18 0213 09/10/18 0638 09/11/18 0348  WBC 8.7  --   --  11.6* 9.9  NEUTROABS 6.5  --   --   --   --   HGB 10.7* 11.2* 12.2 11.4* 10.5*  HCT 33.7* 33.0* 36.0 36.2 32.9*  MCV 94.4  --   --  96.3 94.0  PLT 176  --   --  195 196   BMP &GFR Recent  Labs  Lab 09/09/18 1639 09/09/18 1655 09/10/18 0213 09/10/18 0507 09/11/18 0348  NA 138 134* 135 137 137  K 4.4 4.5 4.2 4.7 4.3  CL 100 101  --  99 100  CO2 25  --   --  22 27  GLUCOSE 194* 187*  --  183* 107*  BUN 39* 47*  --  38* 41*  CREATININE 3.72* 3.80*  --  3.61* 3.68*  CALCIUM 10.1  --   --  9.4 9.1  MG  --   --   --   --  2.1  PHOS  --   --   --  4.3  --    Estimated Creatinine Clearance: 11.8 mL/min (A) (by C-G formula based on SCr of 3.68 mg/dL (H)). Liver & Pancreas: Recent Labs  Lab 09/09/18 1639 09/10/18 0507  AST 25  --   ALT 19  --   ALKPHOS 72  --   BILITOT 0.7  --   PROT 6.8  --   ALBUMIN 3.5 3.3*   No results for input(s): LIPASE, AMYLASE in the last 168 hours. No results for input(s): AMMONIA in the last 168 hours. Diabetic: Recent Labs     09/10/18 0639  HGBA1C 7.6*   Recent Labs  Lab 09/10/18 1248 09/10/18 1853 09/10/18 2102 09/11/18 0813 09/11/18 1331  GLUCAP 111* 164* 129* 111* 137*   Cardiac Enzymes: No results for input(s): CKTOTAL, CKMB, CKMBINDEX, TROPONINI in the last 168 hours. No results for input(s): PROBNP in the last 8760 hours. Coagulation Profile: Recent Labs  Lab 09/09/18 1639  INR 1.1   Thyroid Function Tests: No results for input(s): TSH, T4TOTAL, FREET4, T3FREE, THYROIDAB in the last 72 hours. Lipid Profile: Recent Labs    09/10/18 0507  CHOL 147  HDL 76  LDLCALC 58  TRIG 64  CHOLHDL 1.9   Anemia Panel: No results for input(s): VITAMINB12, FOLATE, FERRITIN, TIBC, IRON, RETICCTPCT in the last 72 hours. Urine analysis:    Component Value Date/Time   COLORURINE YELLOW 07/23/2009 0528   APPEARANCEUR CLEAR 07/23/2009 0528   LABSPEC 1.013 07/23/2009 0528   PHURINE 6.0 07/23/2009 0528   GLUCOSEU NEGATIVE 07/23/2009 0528   HGBUR NEGATIVE 07/23/2009 0528   BILIRUBINUR NEGATIVE 07/23/2009 0528   KETONESUR NEGATIVE 07/23/2009 0528   PROTEINUR NEGATIVE 07/23/2009 0528   UROBILINOGEN 0.2 07/23/2009 0528   NITRITE NEGATIVE 07/23/2009 0528   LEUKOCYTESUR SMALL (A) 07/23/2009 0528   Sepsis Labs: Invalid input(s): PROCALCITONIN, [place]  Microbiology: Recent Results (from the past 240 hour(s))  SARS CORONAVIRUS 2 Nasal Swab Aptima Multi Swab     Status: None   Collection Time: 09/09/18  6:51 PM   Specimen: Aptima Multi Swab; Nasal Swab  Result Value Ref Range Status   SARS Coronavirus 2 NEGATIVE NEGATIVE Final    Comment: (NOTE) SARS-CoV-2 target nucleic acids are NOT DETECTED. The SARS-CoV-2 RNA is generally detectable in upper and lower respiratory specimens during the acute phase of infection. Negative results do not preclude SARS-CoV-2 infection, do not rule out co-infections with other pathogens, and should not be used as the sole basis for treatment or other patient  management decisions. Negative results must be combined with clinical observations, patient history, and epidemiological information. The expected result is Negative. Fact Sheet for Patients: SugarRoll.be Fact Sheet for Healthcare Providers: https://www.woods-mathews.com/ This test is not yet approved or cleared by the [place] FDA and  has been authorized for detection and/or diagnosis of SARS-CoV-2 by FDA under an Emergency Use  Authorization (EUA). This EUA will remain  in effect (meaning this test can be used) for the duration of the COVID-19 declaration under Section 56 4(b)(1) of the Act, 21 U.S.C. section 360bbb-3(b)(1), unless the authorization is terminated or revoked sooner. Performed at [facility], [place] [address], [place], [place] [address]     Radiology Studies: Mr Angio Head Wo Contrast  Result Date: 09/10/2018 CLINICAL DATA:  Focal neuro deficit greater than 6 hours. Suspect stroke EXAM: MRI HEAD WITHOUT CONTRAST MRA HEAD WITHOUT CONTRAST TECHNIQUE: Multiplanar, multiecho pulse sequences of the brain and surrounding structures were obtained without intravenous contrast. Angiographic images of the head were obtained using MRA technique without contrast. COMPARISON:  CT head 09/09/2018.  MRI 07/17/2010 FINDINGS: MRI HEAD FINDINGS Brain: Acute infarct right occipital lobe in the inferior portion. No other acute infarct identified. Mild atrophy. Negative for hydrocephalus. Chronic infarct in the right posterior frontal lobe. Chronic microhemorrhage in the right parietal white matter. Vascular: Stent in the right M1 segment causing artifact. Otherwise normal arterial flow voids at the skull base. Skull and upper cervical spine: Negative Sinuses/Orbits: Mild mucosal edema paranasal sinuses. Bilateral cataract surgery. Other: None MRA HEAD FINDINGS Both vertebral arteries patent to the basilar. Basilar widely patent. PICA not  visualized. Prominent right AICA patent. Fetal origin left posterior cerebral artery. Branch occlusion of left P3 branch. Mild stenosis right P2 segment. Right P3 segments patent without significant stenosis. Internal carotid artery patent bilaterally. Anterior cerebral arteries widely patent bilaterally. Mild stenosis left M1 segment. Left MCA branches widely patent. Loss of flow related signal right middle cerebral artery throughout its course due to artifact from stent. Distal flow evaluation not adequately assessed by this study. IMPRESSION: 1. Acute infarct right occipital lobe. No associated hemorrhage in the infarct. Chronic microhemorrhage right parietal white matter appears separate. 2. Right M1 stent obscures evaluation of the right middle cerebral artery on MRA 3. Right posterior cerebral artery patent. 4. Occluded distal P3 branch on the left. Electronically Signed   By: Franchot Gallo [name]   On: 09/10/2018 18:05   Mr Brain Wo Contrast  Result Date: 09/10/2018 CLINICAL DATA:  Focal neuro deficit greater than 6 hours. Suspect stroke EXAM: MRI HEAD WITHOUT CONTRAST MRA HEAD WITHOUT CONTRAST TECHNIQUE: Multiplanar, multiecho pulse sequences of the brain and surrounding structures were obtained without intravenous contrast. Angiographic images of the head were obtained using MRA technique without contrast. COMPARISON:  CT head 09/09/2018.  MRI 07/17/2010 FINDINGS: MRI HEAD FINDINGS Brain: Acute infarct right occipital lobe in the inferior portion. No other acute infarct identified. Mild atrophy. Negative for hydrocephalus. Chronic infarct in the right posterior frontal lobe. Chronic microhemorrhage in the right parietal white matter. Vascular: Stent in the right M1 segment causing artifact. Otherwise normal arterial flow voids at the skull base. Skull and upper cervical spine: Negative Sinuses/Orbits: Mild mucosal edema paranasal sinuses. Bilateral cataract surgery. Other: None MRA HEAD FINDINGS Both  vertebral arteries patent to the basilar. Basilar widely patent. PICA not visualized. Prominent right AICA patent. Fetal origin left posterior cerebral artery. Branch occlusion of left P3 branch. Mild stenosis right P2 segment. Right P3 segments patent without significant stenosis. Internal carotid artery patent bilaterally. Anterior cerebral arteries widely patent bilaterally. Mild stenosis left M1 segment. Left MCA branches widely patent. Loss of flow related signal right middle cerebral artery throughout its course due to artifact from stent. Distal flow evaluation not adequately assessed by this study. IMPRESSION: 1. Acute infarct right occipital lobe. No associated hemorrhage  in the infarct. Chronic microhemorrhage right parietal white matter appears separate. 2. Right M1 stent obscures evaluation of the right middle cerebral artery on MRA 3. Right posterior cerebral artery patent. 4. Occluded distal P3 branch on the left. Electronically Signed   By: Franchot Gallo [name]   On: 09/10/2018 18:05    Kroy Sprung T. [place] [facility]  If 7PM-7AM, please contact night-coverage [email or web address] Password [email or web address] 09/11/2018, 1:58 PM

## 2018-09-11 NOTE — Progress Notes (Signed)
STROKE TEAM PROGRESS NOTE   INTERVAL HISTORY Pt sitting in chair. No neuro changes, still has left upper quadrantanopia. OT recommend outpt OT. Cardiology saw for AS and plan for outpt follow up. EP saw pt and recommend 30 day.    Vitals:   09/10/18 2350 09/11/18 0156 09/11/18 0400 09/11/18 0730  BP: (!) 135/41 (!) 141/40 (!) 137/34 (!) 153/49  Pulse: 66 67 70 76  Resp: 18 20 18 17   Temp: 98.5 F (36.9 C) 98.1 F (36.7 C) 98.5 F (36.9 C) (!) 97.5 F (36.4 C)  TempSrc: Oral Oral Oral Oral  SpO2: 98% 100% 99% 100%  Weight:   75.6 kg   Height:        CBC:  Recent Labs  Lab 09/09/18 1639  09/10/18 0638 09/11/18 0348  WBC 8.7  --  11.6* 9.9  NEUTROABS 6.5  --   --   --   HGB 10.7*   < > 11.4* 10.5*  HCT 33.7*   < > 36.2 32.9*  MCV 94.4  --  96.3 94.0  PLT 176  --  195 196   < > = values in this interval not displayed.    Basic Metabolic Panel:  Recent Labs  Lab 09/10/18 0507 09/11/18 0348  NA 137 137  K 4.7 4.3  CL 99 100  CO2 22 27  GLUCOSE 183* 107*  BUN 38* 41*  CREATININE 3.61* 3.68*  CALCIUM 9.4 9.1  MG  --  2.1  PHOS 4.3  --    Lipid Panel:     Component Value Date/Time   CHOL 147 09/10/2018 0507   TRIG 64 09/10/2018 0507   HDL 76 09/10/2018 0507   CHOLHDL 1.9 09/10/2018 0507   VLDL 13 09/10/2018 0507   LDLCALC 58 09/10/2018 0507   HgbA1c:  Lab Results  Component Value Date   HGBA1C 7.6 (H) 09/10/2018   Urine Drug Screen:     Component Value Date/Time   LABOPIA NONE DETECTED 11/06/2006 1319   COCAINSCRNUR NONE DETECTED 11/06/2006 1319   LABBENZ NONE DETECTED 11/06/2006 1319   AMPHETMU NONE DETECTED 11/06/2006 1319   THCU NONE DETECTED 11/06/2006 1319   LABBARB  11/06/2006 1319    NONE DETECTED        DRUG SCREEN FOR MEDICAL PURPOSES ONLY.  IF CONFIRMATION IS NEEDED FOR ANY PURPOSE, NOTIFY LAB WITHIN 5 DAYS.    Alcohol Level     Component Value Date/Time   [facility]  11/06/2006 1229    <5        LOWEST DETECTABLE LIMIT FOR SERUM  ALCOHOL IS 11 mg/dL FOR MEDICAL PURPOSES ONLY        LOWEST DETECTABLE LIMIT FOR    IMAGING Ct Head Wo Contrast  Result Date: 09/09/2018 CLINICAL DATA:  78 year old female with severe headache onset this morning and loss of vision. EXAM: CT HEAD WITHOUT CONTRAST TECHNIQUE: Contiguous axial images were obtained from the base of the skull through the vertex without intravenous contrast. COMPARISON:  Brain MRI 07/17/2010. Head CT 07/22/2009. FINDINGS: Brain: Chronic encephalomalacia in the posterosuperior right frontal lobe appears not significantly changed since 2012. However, there is a 3 centimeter area of hypodensity most compatible with cytotoxic edema in the right occipital lobe on series 3, image 12. this area was normal in 2012. No associated hemorrhage. No significant mass effect. Elsewhere gray-white matter differentiation is within normal limits. No midline shift, ventriculomegaly, mass effect, evidence of mass lesion. Vascular: Calcified atherosclerosis at the skull base. There is  also a chronic right MCA M1 stent vascular stent visible on series 6, image 24. No suspicious intracranial vascular hyperdensity. Skull: No acute osseous abnormality identified. Sinuses/Orbits: Visualized paranasal sinuses and mastoids are stable and well pneumatized. Other: Postoperative changes to both globes. No acute orbit or scalp soft tissue finding. IMPRESSION: 1. Positive for acute to subacute Right PCA territory infarct affecting the right occipital lobe. No associated hemorrhage or mass effect. 2. No other acute intracranial abnormality. Chronic posterior right MCA territory infarct and right M1 vascular stent. Electronically Signed   By: Genevie Ann [name]   On: 09/09/2018 17:02   Mr Angio Head Wo Contrast  Result Date: 09/10/2018 CLINICAL DATA:  Focal neuro deficit greater than 6 hours. Suspect stroke EXAM: MRI HEAD WITHOUT CONTRAST MRA HEAD WITHOUT CONTRAST TECHNIQUE: Multiplanar, multiecho pulse sequences of  the brain and surrounding structures were obtained without intravenous contrast. Angiographic images of the head were obtained using MRA technique without contrast. COMPARISON:  CT head 09/09/2018.  MRI 07/17/2010 FINDINGS: MRI HEAD FINDINGS Brain: Acute infarct right occipital lobe in the inferior portion. No other acute infarct identified. Mild atrophy. Negative for hydrocephalus. Chronic infarct in the right posterior frontal lobe. Chronic microhemorrhage in the right parietal white matter. Vascular: Stent in the right M1 segment causing artifact. Otherwise normal arterial flow voids at the skull base. Skull and upper cervical spine: Negative Sinuses/Orbits: Mild mucosal edema paranasal sinuses. Bilateral cataract surgery. Other: None MRA HEAD FINDINGS Both vertebral arteries patent to the basilar. Basilar widely patent. PICA not visualized. Prominent right AICA patent. Fetal origin left posterior cerebral artery. Branch occlusion of left P3 branch. Mild stenosis right P2 segment. Right P3 segments patent without significant stenosis. Internal carotid artery patent bilaterally. Anterior cerebral arteries widely patent bilaterally. Mild stenosis left M1 segment. Left MCA branches widely patent. Loss of flow related signal right middle cerebral artery throughout its course due to artifact from stent. Distal flow evaluation not adequately assessed by this study. IMPRESSION: 1. Acute infarct right occipital lobe. No associated hemorrhage in the infarct. Chronic microhemorrhage right parietal white matter appears separate. 2. Right M1 stent obscures evaluation of the right middle cerebral artery on MRA 3. Right posterior cerebral artery patent. 4. Occluded distal P3 branch on the left. Electronically Signed   By: Franchot Gallo [name]   On: 09/10/2018 18:05   Mr Brain Wo Contrast  Result Date: 09/10/2018 CLINICAL DATA:  Focal neuro deficit greater than 6 hours. Suspect stroke EXAM: MRI HEAD WITHOUT CONTRAST MRA HEAD  WITHOUT CONTRAST TECHNIQUE: Multiplanar, multiecho pulse sequences of the brain and surrounding structures were obtained without intravenous contrast. Angiographic images of the head were obtained using MRA technique without contrast. COMPARISON:  CT head 09/09/2018.  MRI 07/17/2010 FINDINGS: MRI HEAD FINDINGS Brain: Acute infarct right occipital lobe in the inferior portion. No other acute infarct identified. Mild atrophy. Negative for hydrocephalus. Chronic infarct in the right posterior frontal lobe. Chronic microhemorrhage in the right parietal white matter. Vascular: Stent in the right M1 segment causing artifact. Otherwise normal arterial flow voids at the skull base. Skull and upper cervical spine: Negative Sinuses/Orbits: Mild mucosal edema paranasal sinuses. Bilateral cataract surgery. Other: None MRA HEAD FINDINGS Both vertebral arteries patent to the basilar. Basilar widely patent. PICA not visualized. Prominent right AICA patent. Fetal origin left posterior cerebral artery. Branch occlusion of left P3 branch. Mild stenosis right P2 segment. Right P3 segments patent without significant stenosis. Internal carotid artery patent bilaterally. Anterior cerebral arteries widely patent bilaterally.  Mild stenosis left M1 segment. Left MCA branches widely patent. Loss of flow related signal right middle cerebral artery throughout its course due to artifact from stent. Distal flow evaluation not adequately assessed by this study. IMPRESSION: 1. Acute infarct right occipital lobe. No associated hemorrhage in the infarct. Chronic microhemorrhage right parietal white matter appears separate. 2. Right M1 stent obscures evaluation of the right middle cerebral artery on MRA 3. Right posterior cerebral artery patent. 4. Occluded distal P3 branch on the left. Electronically Signed   By: Franchot Gallo [name]   On: 09/10/2018 18:05   Dg Chest Port 1 View  Result Date: 09/10/2018 CLINICAL DATA:  78 year old female with  increasing shortness of breath. EXAM: PORTABLE CHEST 1 VIEW COMPARISON:  None. FINDINGS: There is mild cardiomegaly with vascular congestion and edema. Small bilateral pleural effusions with associated bibasilar atelectasis. Pneumonia is not excluded. No pneumothorax. No acute osseous pathology. IMPRESSION: Mild cardiomegaly with findings of CHF and small bilateral pleural effusions. Electronically Signed   By: Anner Crete [name]   On: 09/10/2018 02:09   Vas US Carotid  Result Date: 09/11/2018 Carotid Arterial Duplex Study Indications:       CVA. Risk Factors:      None. Limitations        Today's exam was limited due to the high bifurcation of the                    carotid and Vessel tortuosity. Comparison Study:  No prior studies. Performing Technologist: Oliver Hum RVT  Examination Guidelines: A complete evaluation includes B-mode imaging, spectral Doppler, color Doppler, and power Doppler as needed of all accessible portions of each vessel. Bilateral testing is considered an integral part of a complete examination. Limited examinations for reoccurring indications may be performed as noted.  Right Carotid Findings: +----------+--------+-------+--------+--------------------------------+--------+           PSV cm/sEDV    StenosisPlaque Description              Comments                   cm/s                                                    +----------+--------+-------+--------+--------------------------------+--------+ CCA Prox  62      0              smooth and heterogenous         tortuous +----------+--------+-------+--------+--------------------------------+--------+ CCA Distal43      6              smooth and heterogenous                  +----------+--------+-------+--------+--------------------------------+--------+ ICA Prox  39      5              smooth, heterogenous and                                                  calcific                                  +----------+--------+-------+--------+--------------------------------+--------+  ICA Distal45      7                                              tortuous +----------+--------+-------+--------+--------------------------------+--------+ ECA       35      6                                                       +----------+--------+-------+--------+--------------------------------+--------+ +----------+--------+-------+--------+-------------------+           PSV cm/sEDV cmsDescribeArm Pressure (mmHG) +----------+--------+-------+--------+-------------------+ [id number]                                        +----------+--------+-------+--------+-------------------+ +---------+--------+--+--------+-+---------+ VertebralPSV cm/s57EDV cm/s8Antegrade +---------+--------+--+--------+-+---------+   Left Carotid Findings: +----------+--------+--------+--------+-----------------------+--------+           PSV cm/sEDV cm/sStenosisPlaque Description     Comments +----------+--------+--------+--------+-----------------------+--------+ CCA Prox  53      0               smooth and heterogenoustortuous +----------+--------+--------+--------+-----------------------+--------+ CCA Distal54      5               smooth and heterogenous         +----------+--------+--------+--------+-----------------------+--------+ ICA Prox  94      9               smooth and heterogenoustortuous +----------+--------+--------+--------+-----------------------+--------+ ICA Distal107     14                                     tortuous +----------+--------+--------+--------+-----------------------+--------+ ECA       84      0                                               +----------+--------+--------+--------+-----------------------+--------+ +----------+--------+--------+--------+-------------------+ SubclavianPSV cm/sEDV cm/sDescribeArm Pressure (mmHG)  +----------+--------+--------+--------+-------------------+           193     38                                  +----------+--------+--------+--------+-------------------+ +---------+--------+--+--------+-+---------+ VertebralPSV cm/s48EDV cm/s6Antegrade +---------+--------+--+--------+-+---------+   Summary: Right Carotid: Velocities in the right ICA are consistent with a 1-39% stenosis. Left Carotid: Velocities in the left ICA are consistent with a 1-39% stenosis. Vertebrals: Bilateral vertebral arteries demonstrate antegrade flow. *See table(s) above for measurements and observations.  Electronically signed by Antony Contras MD on 09/11/2018 at 8:15:19 AM.    Final    Vas [name] Lower Extremity Venous (dvt)  Result Date: 09/10/2018  Lower Venous Study Indications: CVA.  Risk Factors: None identified. Comparison Study: No prior studies. Performing Technologist: Oliver Hum RVT  Examination Guidelines: A complete evaluation includes B-mode imaging, spectral Doppler, color Doppler, and power Doppler as needed of all accessible portions of each vessel. Bilateral testing is considered an integral part of a complete  examination. Limited examinations for reoccurring indications may be performed as noted.  +---------+---------------+---------+-----------+----------+--------------+ RIGHT    CompressibilityPhasicitySpontaneityPropertiesThrombus Aging +---------+---------------+---------+-----------+----------+--------------+ CFV      Full           Yes      Yes                                 +---------+---------------+---------+-----------+----------+--------------+ SFJ      Full                                                        +---------+---------------+---------+-----------+----------+--------------+ FV Prox  Full                                                        +---------+---------------+---------+-----------+----------+--------------+ FV Mid   Full                                                         +---------+---------------+---------+-----------+----------+--------------+ FV DistalFull                                                        +---------+---------------+---------+-----------+----------+--------------+ PFV      Full                                                        +---------+---------------+---------+-----------+----------+--------------+ POP      Full           Yes      Yes                                 +---------+---------------+---------+-----------+----------+--------------+ PTV      Full                                                        +---------+---------------+---------+-----------+----------+--------------+ PERO     Full                                                        +---------+---------------+---------+-----------+----------+--------------+   +---------+---------------+---------+-----------+----------+--------------+ LEFT     CompressibilityPhasicitySpontaneityPropertiesThrombus Aging +---------+---------------+---------+-----------+----------+--------------+ CFV      Full           Yes      Yes                                 +---------+---------------+---------+-----------+----------+--------------+  SFJ      Full                                                        +---------+---------------+---------+-----------+----------+--------------+ FV Prox  Full                                                        +---------+---------------+---------+-----------+----------+--------------+ FV Mid   Full                                                        +---------+---------------+---------+-----------+----------+--------------+ FV DistalFull                                                        +---------+---------------+---------+-----------+----------+--------------+ PFV      Full                                                         +---------+---------------+---------+-----------+----------+--------------+ POP      Full           Yes      Yes                                 +---------+---------------+---------+-----------+----------+--------------+ PTV      Full                                                        +---------+---------------+---------+-----------+----------+--------------+ PERO     Full                                                        +---------+---------------+---------+-----------+----------+--------------+     Summary: Right: There is no evidence of deep vein thrombosis in the lower extremity. No cystic structure found in the popliteal fossa. Left: There is no evidence of deep vein thrombosis in the lower extremity. No cystic structure found in the popliteal fossa.  *See table(s) above for measurements and observations. Electronically signed by Ruta Hinds MD on 09/10/2018 at 6:53:43 PM.    Final     PHYSICAL EXAM   Temp:  [97.5 F (36.4 C)-98.5 F (36.9 C)] 97.5 F (36.4 C) (08/25 0730) Pulse Rate:  [62-76] 76 (08/25 0730) Resp:  [16-20] 17 (08/25 0730) BP: (117-153)/(34-49) 153/49 (08/25 0730) SpO2:  [  98 %-100 %] 100 % (08/25 0730) Weight:  [75.6 kg] 75.6 kg (08/25 0400)  General - Well nourished, well developed, in no apparent distress.  Ophthalmologic - fundi not visualized due to noncooperation.  Cardiovascular - Regular rate and rhythm.  Mental Status -  Level of arousal and orientation to time, place, and person were intact. Language including expression, naming, repetition, comprehension was assessed and found intact. Fund of Knowledge was assessed and was intact.  Cranial Nerves II - XII - II - left upper quadrantanopia, left lower quadrant decreased visual acuity. III, IV, VI - Extraocular movements intact. V - Facial sensation intact bilaterally. VII - Facial movement intact bilaterally. VIII - Hearing & vestibular intact bilaterally. X - Palate  elevates symmetrically. XI - Chin turning & shoulder shrug intact bilaterally. XII - Tongue protrusion intact.  Motor Strength - The patient's strength was normal in all extremities and pronator drift was absent.  Bulk was normal and fasciculations were absent.   Motor Tone - Muscle tone was assessed at the neck and appendages and was normal.  Reflexes - The patient's reflexes were symmetrical in all extremities and she had no pathological reflexes.  Sensory - Light touch, temperature/pinprick were assessed and were symmetrical.    Coordination - The patient had normal movements in the hands with no ataxia or dysmetria.  Tremor was absent.  Gait and Station - deferred.   ASSESSMENT/PLAN [name] is a 78 y.o. female with history of TIA, CVA x 2 ( no residual deficits), HTN, HLD, CKD, CAD, DM presenting with HA and vision loss.   Stroke:   R PCA infarct embolic secondary to unclear source  Resulted left upper quadrantanopia   CT head acute/subacute R PCA infarct occipital lobe. Old R MCA infarct w/ R M1 stent  MRI  Acute R occipital infarct. Chronic microhmg R parietal white matter.  MRA  R M1 stent obscures R MCA. R PCA patent. Occluded distal L P3.   Carotid Doppler  B ICA 1-39% stenosis, VAs antegrade   2D Echo EF 60-65%. LA mildly dilated. Severe stenosis AV. No source of embolus   LE doppler no DVT  EP and cardiology recommend 30d monitor to rule out afib at this time  LDL 58  HgbA1c 7.6  Heparin 5000 units sq tid for VTE prophylaxis  aspirin 81 mg daily prior to admission, now on aspirin 325 mg daily. Recommend ASA 81 and plavix 75 DAPT for 3 weeks and then plavix alone.  Therapy recommendations:  outpt OT  Disposition:  Return home  History Stroke  01/2013: per note Dr. Jannifer Franklin (neurology): prior history of right frontal stroke, M1 stenosis with stent. The stent was subsequently occluded, however pt was doing well. At that time she was taking  aggrenox.  Hypertension  Stable . Permissive hypertension (OK if < 220/120) but gradually normalize in 5-7 days . Long-term BP goal normotensive  Hyperlipidemia  Home meds:  crestosr 20 and niacin, resumed in hospital  LDL 58, goal < 70  Continue statin at discharge  Diabetes type II Uncontrolled Diabetic Neuropathy  HgbA1c 7.6, goal < 7.0  CBGs  SSI  On lantus  Close PCP follow up for better DM control  Other Stroke Risk Factors  Advanced age  Obesity, Body mass index is 30.48 kg/m., recommend weight loss, diet and exercise as appropriate   Coronary artery disease  Migraine  Valvular cardiomyopathy with Severe AS - Dr. Virgina Jock consulted and will further evaluated as an  OP. Needs AV replacement, complicated d/t CKD. Probably needs HD prior to TAVR. Looking to optimize medical care prior to plans for OR.  Other Active Problems  HA  CKD stage IV-V - Cre 3.61  GERD  depression  Hospital day # 2  Neurology will sign off. Please call with questions. Pt will follow up with stroke clinic NP at [facility] in about 4 weeks. Thanks for the consult.   Rosalin Hawking, MD PhD Stroke Neurology 09/11/2018 12:33 PM   To contact Stroke Continuity provider, please refer to [email or web address] After hours, contact General Neurology

## 2018-09-11 NOTE — Consult Note (Addendum)
CARDIOLOGY CONSULT NOTE  Patient ID: [name] MRN: [id number] DOB/AGE: Dec 31, 1940 78 y.o.  Admit date: 09/09/2018 Referring Physician: [facility] hospitalist Reason for Consultation:  Stroke, aortic stenosis.  HPI:   78 y.o. African American female with hypertension, hyperlipidemia, advanced CKD, h/o CVA/TIA, new diagnosis of severe aortic stenosis, CAD s/p dRCA PCI 2015, type 2 DM, now admitted with mono ocular vision loss.  She was found to have Rt PCA infarct, out of TPA window. Patient also has a prior history of right frontal stroke, M1 stenosis with stent. The stent was subsequently occluded.  Patient has left brachiocephalic fistula, but not on dialysis yet. At baseline, she has exertional chest discomfort and dyspnea, consistent with the diagnosis of severe aortic stenosis. Stress test did not show ischemia. During this hospitalization, there is concern for fluid overload given chest Xray findings on 8/24 AM. Since then, she has diuresed 2.9 L. Breathing has improved. She is currently not on oxygen.   Past Medical History:  Diagnosis Date  . Anginal pain ([place])   . Arthritis   . Cerebrovascular disease   . CKD (chronic kidney disease)    Sees Dr Deterding  . Coronary artery disease   . Depression   . Diabetes ([place])    INSULIN DEPENDENT  . Diabetic peripheral neuropathy ([place])   . Diabetic retinopathy ([place])   . Diverticulitis   . Dyslipidemia   . GERD (gastroesophageal reflux disease)   . Headache(784.0) 01/30/2013  . Heart murmur   . History of cerebrovascular accident 01/30/2013  . Hyperlipidemia   . Hypertension   . Migraine   . Obesity   . Stroke ([place])    x 2 no residulal  . TIA (transient ischemic attack)    " MULTIPLE PER PATIENT "; had left sided weakness- rehab and wants without assistance     Past Surgical History:  Procedure Laterality Date  . ABDOMINAL HYSTERECTOMY    . AV FISTULA PLACEMENT Left 12/18/2017   Procedure: ARTERIOVENOUS (AV) FISTULA CREATION  ARM;  Surgeon: Marty Heck, MD;  Location: [place];  Service: Vascular;  Laterality: Left;  . bilateral cataract surgery    . CARDIAC CATHETERIZATION  01/07/2014   DR Crisp RESECTION  02/1999  . COLONOSCOPY    . COLOSTOMY  02/1999  . COLOSTOMY CLOSURE  07/1999  . EYE SURGERY Left 2019  . FISTULA SUPERFICIALIZATION Left 06/29/2018   Procedure: FISTULA SUPERFICIALIZATION LEFT BRACHIOCEPHALIC;  Surgeon: Marty Heck, MD;  Location: [facility];  Service: Vascular;  Laterality: Left;  . LEFT HEART CATHETERIZATION WITH CORONARY ANGIOGRAM N/A 01/07/2014   Procedure: LEFT HEART CATHETERIZATION WITH CORONARY ANGIOGRAM;  Surgeon: Laverda Page, MD;  Location: [facility] CATH LAB;  Service: Cardiovascular;  Laterality: N/A;  . middle cerebral artery stent placement Right   . OTHER SURGICAL HISTORY     laser surgery  . PTCA  01/07/2014   DES to RCA    DR Einar Gip  . right knee surgery Right    for infection     Family History  Problem Relation Age of Onset  . Diabetes Mother   . Heart disease Mother   . Prostate cancer Father   . Hypertension Brother   . Prostate cancer Brother      Social History: Social History   Socioeconomic History  . Marital status: Widowed    Spouse name: Not on file  . Number of children: 2  . Years of education: PHD  . Highest education level:  Not on file  Occupational History  . Not on file  Social Needs  . Financial resource strain: Not on file  . Food insecurity    Worry: Not on file    Inability: Not on file  . Transportation needs    Medical: Not on file    Non-medical: Not on file  Tobacco Use  . Smoking status: Never Smoker  . Smokeless tobacco: Never Used  Substance and Sexual Activity  . Alcohol use: No  . Drug use: No  . Sexual activity: Not on file  Lifestyle  . Physical activity    Days per week: Not on file    Minutes per session: Not on file  . Stress: Not on file  Relationships  . Social Herbalist on  phone: Not on file    Gets together: Not on file    Attends religious service: Not on file    Active member of club or organization: Not on file    Attends meetings of clubs or organizations: Not on file    Relationship status: Not on file  . Intimate partner violence    Fear of current or ex partner: Not on file    Emotionally abused: Not on file    Physically abused: Not on file    Forced sexual activity: Not on file  Other Topics Concern  . Not on file  Social History Narrative   Patient is widowed and lives with her two adopted children.   Patient is retired.   Patient has two children of her own and two adopted children.   Patient has a PHD.   Patient is right-handed.     Medications Prior to Admission  Medication Sig Dispense Refill Last Dose  . acetaminophen (TYLENOL) 325 MG tablet Take 650 mg by mouth every 6 (six) hours as needed (pain/headaches.).   Past Month at Unknown time  . allopurinol (ZYLOPRIM) 100 MG tablet Take 50 mg by mouth daily.   09/09/2018 at Unknown time  . amLODipine (NORVASC) 10 MG tablet Take 10 mg by mouth daily.   09/09/2018 at Unknown time  . aspirin EC 81 MG tablet Take 81 mg by mouth daily.   09/09/2018 at Unknown time  . B-D INS SYR ULTRAFINE 1CC/30G 30G X 1/2" 1 ML MISC Inject 1 each into the skin 3 (three) times daily between meals.   09/09/2018 at Unknown time  . calcitRIOL (ROCALTROL) 0.25 MCG capsule Take 0.5 mcg by mouth daily.   09/09/2018 at Unknown time  . calcium carbonate (OSCAL) 1500 (600 Ca) MG TABS tablet Take 600 mg by mouth daily.   09/09/2018 at Unknown time  . Ferrous Sulfate (IRON) 142 (45 Fe) MG TBCR Take 142 mg by mouth 2 (two) times daily.   09/09/2018 at Unknown time  . folic acid (FOLVITE) 1 MG tablet Take 1 mg by mouth daily.   09/09/2018 at Unknown time  . HUMALOG KWIKPEN 100 UNIT/ML KwikPen 4-6 Units by Subconjunctival route 3 (three) times daily before meals. Sliding Scale Insulin   09/09/2018 at Unknown time  . insulin glargine  (LANTUS) 100 UNIT/ML injection Inject 12 Units into the skin at bedtime.    09/08/2018 at Unknown time  . labetalol (NORMODYNE) 300 MG tablet Take 1 tablet by mouth twice daily (Patient taking differently: Take 300 mg by mouth 2 (two) times daily. ) 60 tablet 11 09/09/2018 at Unknown time  . leflunomide (ARAVA) 10 MG tablet Take 10 mg by mouth daily.  09/09/2018 at Unknown time  . lisinopril (ZESTRIL) 5 MG tablet Take 5 mg by mouth daily.   09/09/2018 at Unknown time  . Magnesium 250 MG TABS Take 250 mg by mouth daily.   09/09/2018 at Unknown time  . niacin (NIASPAN) 500 MG CR tablet Take 500 mg by mouth daily.    09/09/2018 at Unknown time  . nortriptyline (PAMELOR) 10 MG capsule Take 10 mg by mouth daily.   09/09/2018 at Unknown time  . omeprazole (PRILOSEC) 40 MG capsule Take 40 mg by mouth daily.   09/09/2018 at Unknown time  . ONE TOUCH ULTRA TEST test strip 1 each by Other route 3 (three) times daily between meals.   09/09/2018 at Unknown time  . oxyCODONE-acetaminophen (PERCOCET) 5-325 MG tablet Take 1 tablet by mouth every 6 (six) hours as needed for severe pain. 12 tablet 0 09/09/2018 at Unknown time  . polyvinyl alcohol (TEARS AGAIN) 1.4 % ophthalmic solution Place 1 drop into both eyes 3 (three) times daily as needed for dry eyes.   09/09/2018 at Unknown time  . PRISTIQ 100 MG 24 hr tablet Take 100 mg by mouth daily.    09/09/2018 at Unknown time  . rosuvastatin (CRESTOR) 20 MG tablet Take 20 mg by mouth daily.   09/09/2018 at Unknown time  . torsemide (DEMADEX) 20 MG tablet Take 20 mg by mouth daily.   09/09/2018 at Unknown time  . nitroGLYCERIN (NITROSTAT) 0.4 MG SL tablet PLACE 1 TABLET (0.4 MG TOTAL) UNDER THE TONGUE EVERY 5 MINUTES X 3 DOSES AS NEEDED FOR CHEST PAIN. (Patient not taking: No sig reported) 25 tablet 1 Not Taking at Unknown time    ROS    Physical Exam: Physical Exam   Labs:   Lab Results  Component Value Date   WBC 9.9 09/11/2018   HGB 10.5 (L) 09/11/2018   HCT 32.9  (L) 09/11/2018   MCV 94.0 09/11/2018   PLT 196 09/11/2018    Recent Labs  Lab 09/09/18 1639  09/11/18 0348  NA 138   < > 137  K 4.4   < > 4.3  CL 100   < > 100  CO2 25   < > 27  BUN 39*   < > 41*  CREATININE 3.72*   < > 3.68*  CALCIUM 10.1   < > 9.1  PROT 6.8  --   --   BILITOT 0.7  --   --   ALKPHOS 72  --   --   ALT 19  --   --   AST 25  --   --   GLUCOSE 194*   < > 107*   < > = values in this interval not displayed.    Lipid Panel     Component Value Date/Time   CHOL 147 09/10/2018 0507   TRIG 64 09/10/2018 0507   HDL 76 09/10/2018 0507   CHOLHDL 1.9 09/10/2018 0507   VLDL 13 09/10/2018 0507   LDLCALC 58 09/10/2018 0507    BNP (last 3 results) No results for input(s): BNP in the last 8760 hours.  HEMOGLOBIN A1C Lab Results  Component Value Date   HGBA1C 7.6 (H) 09/10/2018   MPG 171.42 09/10/2018    Cardiac Panel (last 3 results) No results for input(s): CKTOTAL, CKMB, TROPONINI, RELINDX in the last 8760 hours.  Lab Results  Component Value Date   CKTOTAL 170 11/06/2006   CKMB 2.1 11/06/2006   TROPONINI 0.01  NO INDICATION OF MYOCARDIAL INJURY. 11/06/2006     TSH No results for input(s): TSH in the last 8760 hours.    Radiology: Ct Head Wo Contrast  Result Date: 09/09/2018 CLINICAL DATA:  78 year old female with severe headache onset this morning and loss of vision. EXAM: CT HEAD WITHOUT CONTRAST TECHNIQUE: Contiguous axial images were obtained from the base of the skull through the vertex without intravenous contrast. COMPARISON:  Brain MRI 07/17/2010. Head CT 07/22/2009. FINDINGS: Brain: Chronic encephalomalacia in the posterosuperior right frontal lobe appears not significantly changed since 2012. However, there is a 3 centimeter area of hypodensity most compatible with cytotoxic edema in the right occipital lobe on series 3, image 12. this area was normal in 2012. No associated hemorrhage. No significant mass effect. Elsewhere gray-white  matter differentiation is within normal limits. No midline shift, ventriculomegaly, mass effect, evidence of mass lesion. Vascular: Calcified atherosclerosis at the skull base. There is also a chronic right MCA M1 stent vascular stent visible on series 6, image 24. No suspicious intracranial vascular hyperdensity. Skull: No acute osseous abnormality identified. Sinuses/Orbits: Visualized paranasal sinuses and mastoids are stable and well pneumatized. Other: Postoperative changes to both globes. No acute orbit or scalp soft tissue finding. IMPRESSION: 1. Positive for acute to subacute Right PCA territory infarct affecting the right occipital lobe. No associated hemorrhage or mass effect. 2. No other acute intracranial abnormality. Chronic posterior right MCA territory infarct and right M1 vascular stent. Electronically Signed   By: Genevie Ann [name]   On: 09/09/2018 17:02   Mr Angio Head Wo Contrast  Result Date: 09/10/2018 CLINICAL DATA:  Focal neuro deficit greater than 6 hours. Suspect stroke EXAM: MRI HEAD WITHOUT CONTRAST MRA HEAD WITHOUT CONTRAST TECHNIQUE: Multiplanar, multiecho pulse sequences of the brain and surrounding structures were obtained without intravenous contrast. Angiographic images of the head were obtained using MRA technique without contrast. COMPARISON:  CT head 09/09/2018.  MRI 07/17/2010 FINDINGS: MRI HEAD FINDINGS Brain: Acute infarct right occipital lobe in the inferior portion. No other acute infarct identified. Mild atrophy. Negative for hydrocephalus. Chronic infarct in the right posterior frontal lobe. Chronic microhemorrhage in the right parietal white matter. Vascular: Stent in the right M1 segment causing artifact. Otherwise normal arterial flow voids at the skull base. Skull and upper cervical spine: Negative Sinuses/Orbits: Mild mucosal edema paranasal sinuses. Bilateral cataract surgery. Other: None MRA HEAD FINDINGS Both vertebral arteries patent to the basilar. Basilar widely  patent. PICA not visualized. Prominent right AICA patent. Fetal origin left posterior cerebral artery. Branch occlusion of left P3 branch. Mild stenosis right P2 segment. Right P3 segments patent without significant stenosis. Internal carotid artery patent bilaterally. Anterior cerebral arteries widely patent bilaterally. Mild stenosis left M1 segment. Left MCA branches widely patent. Loss of flow related signal right middle cerebral artery throughout its course due to artifact from stent. Distal flow evaluation not adequately assessed by this study. IMPRESSION: 1. Acute infarct right occipital lobe. No associated hemorrhage in the infarct. Chronic microhemorrhage right parietal white matter appears separate. 2. Right M1 stent obscures evaluation of the right middle cerebral artery on MRA 3. Right posterior cerebral artery patent. 4. Occluded distal P3 branch on the left. Electronically Signed   By: Franchot Gallo [name]   On: 09/10/2018 18:05   Mr Brain Wo Contrast  Result Date: 09/10/2018 CLINICAL DATA:  Focal neuro deficit greater than 6 hours. Suspect stroke EXAM: MRI HEAD WITHOUT CONTRAST MRA HEAD WITHOUT CONTRAST TECHNIQUE: Multiplanar, multiecho pulse sequences of the  brain and surrounding structures were obtained without intravenous contrast. Angiographic images of the head were obtained using MRA technique without contrast. COMPARISON:  CT head 09/09/2018.  MRI 07/17/2010 FINDINGS: MRI HEAD FINDINGS Brain: Acute infarct right occipital lobe in the inferior portion. No other acute infarct identified. Mild atrophy. Negative for hydrocephalus. Chronic infarct in the right posterior frontal lobe. Chronic microhemorrhage in the right parietal white matter. Vascular: Stent in the right M1 segment causing artifact. Otherwise normal arterial flow voids at the skull base. Skull and upper cervical spine: Negative Sinuses/Orbits: Mild mucosal edema paranasal sinuses. Bilateral cataract surgery. Other: None MRA HEAD  FINDINGS Both vertebral arteries patent to the basilar. Basilar widely patent. PICA not visualized. Prominent right AICA patent. Fetal origin left posterior cerebral artery. Branch occlusion of left P3 branch. Mild stenosis right P2 segment. Right P3 segments patent without significant stenosis. Internal carotid artery patent bilaterally. Anterior cerebral arteries widely patent bilaterally. Mild stenosis left M1 segment. Left MCA branches widely patent. Loss of flow related signal right middle cerebral artery throughout its course due to artifact from stent. Distal flow evaluation not adequately assessed by this study. IMPRESSION: 1. Acute infarct right occipital lobe. No associated hemorrhage in the infarct. Chronic microhemorrhage right parietal white matter appears separate. 2. Right M1 stent obscures evaluation of the right middle cerebral artery on MRA 3. Right posterior cerebral artery patent. 4. Occluded distal P3 branch on the left. Electronically Signed   By: Franchot Gallo [name]   On: 09/10/2018 18:05   Dg Chest Port 1 View  Result Date: 09/10/2018 CLINICAL DATA:  78 year old female with increasing shortness of breath. EXAM: PORTABLE CHEST 1 VIEW COMPARISON:  None. FINDINGS: There is mild cardiomegaly with vascular congestion and edema. Small bilateral pleural effusions with associated bibasilar atelectasis. Pneumonia is not excluded. No pneumothorax. No acute osseous pathology. IMPRESSION: Mild cardiomegaly with findings of CHF and small bilateral pleural effusions. Electronically Signed   By: Anner Crete [name]   On: 09/10/2018 02:09   Vas US Carotid  Result Date: 09/11/2018 Carotid Arterial Duplex Study Indications:       CVA. Risk Factors:      None. Limitations        Today's exam was limited due to the high bifurcation of the                    carotid and Vessel tortuosity. Comparison Study:  No prior studies. Performing Technologist: Oliver Hum RVT  Examination Guidelines: A  complete evaluation includes B-mode imaging, spectral Doppler, color Doppler, and power Doppler as needed of all accessible portions of each vessel. Bilateral testing is considered an integral part of a complete examination. Limited examinations for reoccurring indications may be performed as noted.  Right Carotid Findings: +----------+--------+-------+--------+--------------------------------+--------+           PSV cm/sEDV    StenosisPlaque Description              Comments                   cm/s                                                    +----------+--------+-------+--------+--------------------------------+--------+ CCA Prox  62      0  smooth and heterogenous         tortuous +----------+--------+-------+--------+--------------------------------+--------+ CCA Distal43      6              smooth and heterogenous                  +----------+--------+-------+--------+--------------------------------+--------+ ICA Prox  39      5              smooth, heterogenous and                                                  calcific                                 +----------+--------+-------+--------+--------------------------------+--------+ ICA Distal45      7                                              tortuous +----------+--------+-------+--------+--------------------------------+--------+ ECA       35      6                                                       +----------+--------+-------+--------+--------------------------------+--------+ +----------+--------+-------+--------+-------------------+           PSV cm/sEDV cmsDescribeArm Pressure (mmHG) +----------+--------+-------+--------+-------------------+ [id number]                                        +----------+--------+-------+--------+-------------------+ +---------+--------+--+--------+-+---------+ VertebralPSV cm/s57EDV cm/s8Antegrade  +---------+--------+--+--------+-+---------+   Left Carotid Findings: +----------+--------+--------+--------+-----------------------+--------+           PSV cm/sEDV cm/sStenosisPlaque Description     Comments +----------+--------+--------+--------+-----------------------+--------+ CCA Prox  53      0               smooth and heterogenoustortuous +----------+--------+--------+--------+-----------------------+--------+ CCA Distal54      5               smooth and heterogenous         +----------+--------+--------+--------+-----------------------+--------+ ICA Prox  94      9               smooth and heterogenoustortuous +----------+--------+--------+--------+-----------------------+--------+ ICA Distal107     14                                     tortuous +----------+--------+--------+--------+-----------------------+--------+ ECA       84      0                                               +----------+--------+--------+--------+-----------------------+--------+ +----------+--------+--------+--------+-------------------+ SubclavianPSV cm/sEDV cm/sDescribeArm Pressure (mmHG) +----------+--------+--------+--------+-------------------+           193     38                                  +----------+--------+--------+--------+-------------------+ +---------+--------+--+--------+-+---------+  VertebralPSV cm/s48EDV cm/s6Antegrade +---------+--------+--+--------+-+---------+   Summary: Right Carotid: Velocities in the right ICA are consistent with a 1-39% stenosis. Left Carotid: Velocities in the left ICA are consistent with a 1-39% stenosis. Vertebrals: Bilateral vertebral arteries demonstrate antegrade flow. *See table(s) above for measurements and observations.  Electronically signed by Antony Contras MD on 09/11/2018 at 8:15:19 AM.    Final    Vas [name] Lower Extremity Venous (dvt)  Result Date: 09/10/2018  Lower Venous Study Indications: CVA.  Risk Factors:  None identified. Comparison Study: No prior studies. Performing Technologist: Oliver Hum RVT  Examination Guidelines: A complete evaluation includes B-mode imaging, spectral Doppler, color Doppler, and power Doppler as needed of all accessible portions of each vessel. Bilateral testing is considered an integral part of a complete examination. Limited examinations for reoccurring indications may be performed as noted.  +---------+---------------+---------+-----------+----------+--------------+ RIGHT    CompressibilityPhasicitySpontaneityPropertiesThrombus Aging +---------+---------------+---------+-----------+----------+--------------+ CFV      Full           Yes      Yes                                 +---------+---------------+---------+-----------+----------+--------------+ SFJ      Full                                                        +---------+---------------+---------+-----------+----------+--------------+ FV Prox  Full                                                        +---------+---------------+---------+-----------+----------+--------------+ FV Mid   Full                                                        +---------+---------------+---------+-----------+----------+--------------+ FV DistalFull                                                        +---------+---------------+---------+-----------+----------+--------------+ PFV      Full                                                        +---------+---------------+---------+-----------+----------+--------------+ POP      Full           Yes      Yes                                 +---------+---------------+---------+-----------+----------+--------------+ PTV      Full                                                        +---------+---------------+---------+-----------+----------+--------------+  PERO     Full                                                         +---------+---------------+---------+-----------+----------+--------------+   +---------+---------------+---------+-----------+----------+--------------+ LEFT     CompressibilityPhasicitySpontaneityPropertiesThrombus Aging +---------+---------------+---------+-----------+----------+--------------+ CFV      Full           Yes      Yes                                 +---------+---------------+---------+-----------+----------+--------------+ SFJ      Full                                                        +---------+---------------+---------+-----------+----------+--------------+ FV Prox  Full                                                        +---------+---------------+---------+-----------+----------+--------------+ FV Mid   Full                                                        +---------+---------------+---------+-----------+----------+--------------+ FV DistalFull                                                        +---------+---------------+---------+-----------+----------+--------------+ PFV      Full                                                        +---------+---------------+---------+-----------+----------+--------------+ POP      Full           Yes      Yes                                 +---------+---------------+---------+-----------+----------+--------------+ PTV      Full                                                        +---------+---------------+---------+-----------+----------+--------------+ PERO     Full                                                        +---------+---------------+---------+-----------+----------+--------------+  Summary: Right: There is no evidence of deep vein thrombosis in the lower extremity. No cystic structure found in the popliteal fossa. Left: There is no evidence of deep vein thrombosis in the lower extremity. No cystic structure found in the popliteal fossa.  *See  table(s) above for measurements and observations. Electronically signed by Ruta Hinds MD on 09/10/2018 at 6:53:43 PM.    Final     Scheduled Meds: .  stroke: mapping our early stages of recovery book   Does not apply Once  . allopurinol  50 mg Oral Daily  . aspirin EC  81 mg Oral Daily  . calcitRIOL  0.5 mcg Oral Daily  . calcium carbonate  1 tablet Oral Q breakfast  . clopidogrel  75 mg Oral Daily  . ferrous sulfate  325 mg Oral BID  . folic acid  1 mg Oral Daily  . heparin  5,000 Units Subcutaneous Q8H  . insulin aspart  0-9 Units Subcutaneous TID WC  . insulin glargine  12 Units Subcutaneous QHS  . leflunomide  10 mg Oral Daily  . magnesium oxide  200 mg Oral Daily  . niacin  500 mg Oral Daily  . nortriptyline  10 mg Oral QHS  . pantoprazole  40 mg Oral Daily  . rosuvastatin  20 mg Oral q1800  . torsemide  20 mg Oral Daily  . venlafaxine XR  150 mg Oral Q breakfast   Continuous Infusions: PRN Meds:.acetaminophen **OR** acetaminophen (TYLENOL) oral liquid 160 mg/5 mL **OR** acetaminophen, hydrALAZINE, ipratropium-albuterol, morphine injection, oxyCODONE-acetaminophen, senna-docusate  CARDIAC STUDIES:  Telemetry 09/10/2018: 8 beat NSVT  EKG 09/09/2018: Sinus rhythm. Nonspecific ST-T changes lateral leads.   Echocardiogram 09/10/2018:  1. The left ventricle has normal systolic function with an ejection fraction of 60-65%. The cavity size was normal. There is severe asymmetric left ventricular hypertrophy with mild concentric LV hypertrophy.  2. The right ventricle has normal systolic function. The cavity was normal. There is no increase in right ventricular wall thickness. Right ventricular systolic pressure is moderately elevated.  3. Left atrial size was mildly dilated.  4. The aortic valve is tricuspid. Moderate thickening of the aortic valve. Severe calcifcation of the aortic valve. Aortic valve regurgitation is moderate by color flow Doppler. Severe stenosis of the  aortic valve. Moderate aortic annular calcification  noted. AV Vmax: 379.20 cm/s. AV Mean Grad: 88mmHg . AV Area (VTI): 0.42 cm. LVOT/AV VTI ratio: 0.23.  5. The aorta is normal unless otherwise noted.  6. The inferior vena cava was dilated in size with >50% respiratory variability.  Mitchell Stress Test 08/06/2018: Stress EKG is non-diagnostic, as this is pharmacological stress test. Myocardial perfusion imaging is normal. Left ventricular ejection fraction is  50% with normal wall motion. Low risk study.   Assessment & Recommendations:  78 y.o. African American female with hypertension, hyperlipidemia, advanced CKD, h/o CVA/TIA, new diagnosis of severe aortic stenosis, CAD s/p dRCA PCI 2015, type 2 DM, now admitted Rt PCA infarct.  Valvular cardiomyopathy with severe AS, mod AI, PH: Symptomatic with exertional dyspnea and chest pain at rest.  Breathing improved with torsemide this admission with 2.9 L urine output.  Continue torsemide 20 mg daily, which can be increased to bid, as necessary.  Ultimately, she needs aortic valve replacement given symptomatic severe aortic stenosis.  Timing of this is going to be critical given her advanced CKD.  She is currently not on dialysis.  Ideally, would like her to be on dialysis with at least  few sessions before exposing her to contrast dye.  She has very high risk of contrast-induced nephropathy and ultimately requiring dialysis.  Moreover, I do not think she will be a candidate for aortic valve replacement until she is stabilized on dialysis.  The timing is further complicated by her recent ocular stroke.  I will defer to nephrology and neurology regarding optimizing her baseline status and timing of left and right heart catheterization coronary angiography and ultimately referral for possible TAVR.  Ocular stroke: Continue management as per neurology with DAPT and statin.  Discussed with EP. Given her systemic atherosclerosis being more  likely etiology for her stroke, we mutually agreed on starting with 30 day event monitor. If negative, can then pursue loop recorder. Will arrange outpatient event monitor through our office.   Will arrange outpatient follow up.  Nigel Mormon, MD 09/11/2018, 9:30 AM [facility] Pager: [phone] Office: [phone] If no answer Cell [phone]

## 2018-09-12 ENCOUNTER — Ambulatory Visit: Payer: Medicare Other | Admitting: Cardiology

## 2018-09-12 DIAGNOSIS — H21562 Pupillary abnormality, left eye: Secondary | ICD-10-CM

## 2018-09-12 DIAGNOSIS — I639 Cerebral infarction, unspecified: Secondary | ICD-10-CM

## 2018-09-12 DIAGNOSIS — I5033 Acute on chronic diastolic (congestive) heart failure: Secondary | ICD-10-CM

## 2018-09-12 LAB — CBC
HCT: 32.8 % — ABNORMAL LOW (ref 36.0–46.0)
Hemoglobin: 10.7 g/dL — ABNORMAL LOW (ref 12.0–15.0)
MCH: 30.1 pg (ref 26.0–34.0)
MCHC: 32.6 g/dL (ref 30.0–36.0)
MCV: 92.1 fL (ref 80.0–100.0)
Platelets: 197 10*3/uL (ref 150–400)
RBC: 3.56 MIL/uL — ABNORMAL LOW (ref 3.87–5.11)
RDW: 13.1 % (ref 11.5–15.5)
WBC: 9.6 10*3/uL (ref 4.0–10.5)
nRBC: 0 % (ref 0.0–0.2)

## 2018-09-12 LAB — BASIC METABOLIC PANEL
Anion gap: 12 (ref 5–15)
BUN: 42 mg/dL — ABNORMAL HIGH (ref 8–23)
CO2: 24 mmol/L (ref 22–32)
Calcium: 9.2 mg/dL (ref 8.9–10.3)
Chloride: 99 mmol/L (ref 98–111)
Creatinine, Ser: 3.37 mg/dL — ABNORMAL HIGH (ref 0.44–1.00)
GFR calc Af Amer: 14 mL/min — ABNORMAL LOW (ref >60)
GFR calc non Af Amer: 12 mL/min — ABNORMAL LOW (ref >60)
Glucose, Bld: 127 mg/dL — ABNORMAL HIGH (ref 70–99)
Potassium: 4.1 mmol/L (ref 3.5–5.1)
Sodium: 135 mmol/L (ref 135–145)

## 2018-09-12 LAB — MAGNESIUM: Magnesium: 2.1 mg/dL (ref 1.7–2.4)

## 2018-09-12 LAB — GLUCOSE, CAPILLARY
Glucose-Capillary: 148 mg/dL — ABNORMAL HIGH (ref 70–99)
Glucose-Capillary: 100 mg/dL — ABNORMAL HIGH (ref 70–99)
Glucose-Capillary: 148 mg/dL — ABNORMAL HIGH (ref 70–99)

## 2018-09-12 LAB — PHOSPHORUS: Phosphorus: 3.2 mg/dL (ref 2.5–4.6)

## 2018-09-12 MED ORDER — CLOPIDOGREL BISULFATE 75 MG PO TABS: 75.0000 mg | ORAL_TABLET | Freq: Every day | ORAL | 1 refills | Status: DC

## 2018-09-12 MED ORDER — TORSEMIDE 20 MG PO TABS: 20.0000 mg | ORAL_TABLET | Freq: Every day | ORAL | 0 refills | Status: DC

## 2018-09-12 MED FILL — CLOPIDOGREL 75 MG TABLET: 75 | 90 days supply | Qty: 90 | Fill #0

## 2018-09-12 NOTE — Progress Notes (Signed)
NURSING PROGRESS NOTE  Susan MCCOMMONS [id number] Discharge Data: 09/12/2018 3:05 PM Attending Provider: Mercy Riding, MD [id number], Mauro Kaufmann, MD     [name] to be D/C'd Home per MD order.  Discussed with the patient the After Visit Summary and all questions fully answered. All IV's discontinued with no bleeding noted. Telebox was removed and cleaned. Pt with bleeding to abdomen from old heparin stick. Pressure dressing applied and bleeding subsided. Pt given mapping out stroke handbook and all information was reviewed.  All belongings returned to patient for patient to take home.   Last Vital Signs:  Blood pressure (!) 157/50, pulse 69, temperature 97.8 F (36.6 C), temperature source Oral, resp. rate 18, height 5\' 2"  (1.575 m), weight 74.4 kg, SpO2 95 %.  Discharge Medication List Allergies as of 09/12/2018   No Known Allergies     Medication List    STOP taking these medications   amLODipine 10 MG tablet Commonly known as: NORVASC   labetalol 300 MG tablet Commonly known as: NORMODYNE   lisinopril 5 MG tablet Commonly known as: ZESTRIL     TAKE these medications   acetaminophen 325 MG tablet Commonly known as: TYLENOL Take 650 mg by mouth every 6 (six) hours as needed (pain/headaches.).   allopurinol 100 MG tablet Commonly known as: ZYLOPRIM Take 50 mg by mouth daily. Notes to patient: Next dose 8/27 am   aspirin EC 81 MG tablet Take 81 mg by mouth daily. Notes to patient: Next dose 8/27 am   B-D INS SYR ULTRAFINE 1CC/30G 30G X 1/2" 1 ML Misc Generic drug: Insulin Syringe-Needle U-100 Inject 1 each into the skin 3 (three) times daily between meals.   calcitRIOL 0.25 MCG capsule Commonly known as: ROCALTROL Take 0.5 mcg by mouth daily. Notes to patient: Next dose 8/27 am   calcium carbonate 1500 (600 Ca) MG Tabs tablet Commonly known as: OSCAL Take 600 mg by mouth daily. Notes to patient: Next dose 8/27 am   clopidogrel 75 MG tablet Commonly known  as: PLAVIX Take 1 tablet (75 mg total) by mouth daily. Notes to patient: Next dose [id number] am   folic acid 1 MG tablet Commonly known as: FOLVITE Take 1 mg by mouth daily. Notes to patient: Next dose 8/27 am   HumaLOG KwikPen 100 UNIT/ML KwikPen Generic drug: insulin lispro 4-6 Units by Subconjunctival route 3 (three) times daily before meals. Sliding Scale Insulin   insulin glargine 100 UNIT/ML injection Commonly known as: LANTUS Inject 12 Units into the skin at bedtime.   Iron 142 (45 Fe) MG Tbcr Take 142 mg by mouth 2 (two) times daily. Notes to patient: Next dose 8/26 evening/bedtime   leflunomide 10 MG tablet Commonly known as: ARAVA Take 10 mg by mouth daily. Notes to patient: Next dose 8/27 am   Magnesium 250 MG Tabs Take 250 mg by mouth daily. Notes to patient: Next dose 8/27 am   niacin 500 MG CR tablet Commonly known as: NIASPAN Take 500 mg by mouth daily. Notes to patient: Next dose 8/27 am   nitroGLYCERIN 0.4 MG SL tablet Commonly known as: NITROSTAT PLACE 1 TABLET (0.4 MG TOTAL) UNDER THE TONGUE EVERY 5 MINUTES X 3 DOSES AS NEEDED FOR CHEST PAIN.   nortriptyline 10 MG capsule Commonly known as: PAMELOR Take 10 mg by mouth daily. Notes to patient: Next dose 8/27 am   omeprazole 40 MG capsule Commonly known as: PRILOSEC Take 40 mg by mouth daily.   ONE TOUCH  ULTRA TEST test strip Generic drug: glucose blood 1 each by Other route 3 (three) times daily between meals.   oxyCODONE-acetaminophen 5-325 MG tablet Commonly known as: Percocet Take 1 tablet by mouth every 6 (six) hours as needed for severe pain.   Pristiq 100 MG 24 hr tablet Generic drug: desvenlafaxine Take 100 mg by mouth daily.   rosuvastatin 20 MG tablet Commonly known as: CRESTOR Take 20 mg by mouth daily. Notes to patient: Next dose 8/26 evening/bedtime   Tears Again 1.4 % ophthalmic solution Generic drug: polyvinyl alcohol Place 1 drop into both eyes 3 (three) times daily as  needed for dry eyes.   torsemide 20 MG tablet Commonly known as: DEMADEX Take 1 tablet (20 mg total) by mouth daily. May take additional dose in the afternoon for swelling, shortness of breath or weight gain. What changed: additional instructions Notes to patient: Next dose 8/27 am

## 2018-09-12 NOTE — Progress Notes (Signed)
Occupational Therapy Treatment and Discharge Patient Details Name: [name] MRN: [id number] DOB: Jul 15, 1940 Today's Date: 09/12/2018    History of present illness 78 year old female with history of previous stroke, depression, DM-2, peripheral neuropathy, CAD, left eye cataract surgery and migraine presenting with acute vision loss mainly on the left, and posterior throbbing headache radiating to her right temple.  Reportedly had DBP in 30s and 40s at home. Admitted for treatment of Acute right PICA territory CVA/left hemianopsia and Diastolic CHF exacerbation 09/09/18   OT comments  This 78 yo seen today to address vision and transfers. Pt is aware of her vision deficit and is compensating appropriately and safely in the hospital environment. Pt is more steady on her feet today as well. We will D/C from acute OT with follow up OPOT.  Follow Up Recommendations  Outpatient OT;Other (comment)    Equipment Recommendations  None recommended by OT       Precautions / Restrictions Precautions Precaution Comments: left homonymous heminaposia---appears upper quadrant is more involved than lower Restrictions Weight Bearing Restrictions: No       Mobility Bed Mobility Overal bed mobility: Independent                Transfers Overall transfer level: Needs assistance Equipment used: None Transfers: Sit to/from Stand Sit to Stand: Supervision         General transfer comment: S to ambulate to bathroom without AD--no c/o pain in Left calf today    Balance Overall balance assessment: No apparent balance deficits (not formally assessed)                                         ADL either performed or assessed with clinical judgement   ADL Overall ADL's : Needs assistance/impaired                         Toilet Transfer: Supervision/safety;Ambulation;Regular Glass blower/designer Details (indicate cue type and reason): NO AD Toileting- Clothing  Manipulation and Hygiene: Supervision/safety;Sit to/from stand         General ADL Comments: We talked again about safety in kitchen, not drivign until cleared by MD, getting vision tested by her eye MD ([place] vision field test), being aware when she is in new environments she needs to really be aware to look to left so she does not run into or trip over objects.     Vision   Additional Comments: Pt wanted to shift paper to right with reading, we talked about her putting paper at midline and moving head and eyes (so she doesnt start having neck issues with keeping head in static position). Had pt look and locate post it notes on wall--pt could not see perpherially to left (more in upper than lower quadrant)--but when she turned her head and eyes she could.          Cognition Arousal/Alertness: Awake/alert Behavior During Therapy: WFL for tasks assessed/performed Overall Cognitive Status: Within Functional Limits for tasks assessed                                                     Pertinent Vitals/ Pain       Pain Assessment: No/denies pain  Prior Functioning/Environment              Frequency  Min 2X/week        Progress Toward Goals  OT Goals(current goals can now be found in the care plan section)  Progress towards OT goals: Progressing toward goals     Plan Discharge plan remains appropriate       AM-PAC OT "6 Clicks" Daily Activity     Outcome Measure   Help from another person eating meals?: None Help from another person taking care of personal grooming?: A Little Help from another person toileting, which includes using toliet, bedpan, or urinal?: A Little Help from another person bathing (including washing, rinsing, drying)?: A Little Help from another person to put on and taking off regular upper body clothing?: A Little Help from another person to put on and taking off regular lower body clothing?: A Little 6 Click Score:  19    End of Session Equipment Utilized During Treatment: Gait belt  OT Visit Diagnosis: Unsteadiness on feet (R26.81);Low vision, both eyes (H54.2)   Activity Tolerance Patient tolerated treatment well   Patient Left in chair;with call bell/phone within reach   Nurse Communication          Time: [id number] OT Time Calculation (min): 24 min  Charges: OT General Charges $OT Visit: 1 Visit OT Treatments $Self Care/Home Management : 23-37 mins  Golden Circle, OTR/L Acute [employer] Pager [phone] Office [phone]      Almon Register 09/12/2018, 9:55 AM

## 2018-09-12 NOTE — Plan of Care (Signed)
  Problem: Education: Goal: Knowledge of disease or condition will improve Outcome: Progressing   Problem: Education: Goal: Knowledge of secondary prevention will improve Outcome: Progressing   Problem: Education: Goal: Knowledge of patient specific risk factors addressed and post discharge goals established will improve Outcome: Progressing   Problem: Nutrition: Goal: Risk of aspiration will decrease Outcome: Progressing   Problem: Nutrition: Goal: Dietary intake will improve Outcome: Completed/Met

## 2018-09-12 NOTE — Care Management Important Message (Signed)
Important Message  Patient Details  Name: [name] MRN: [id number] Date of Birth: September 23, 1940   Medicare Important Message Given:  Yes     Memory Argue 09/12/2018, 3:42 PM   IM SIGNED BY PATIENT

## 2018-09-12 NOTE — Discharge Summary (Signed)
Physician Discharge Summary  [name] [id number] DOB: 11/22/1940 DOA: 09/09/2018  PCP: Merrilee Seashore, MD  Admit date: 09/09/2018 Discharge date: 09/12/2018  Admitted From: Home Disposition: Home  Recommendations for Outpatient Follow-up:  1. Follow up with PCP/cardiology/ophthalmology in 1-2 weeks 2. Follow-up with neurology as recommended below 3. Please obtain CBC/BMP/Mag at follow up 4. Recommend arranging outpatient PT 5. Please follow up on the following pending results: None  Home Health: None.  Equipment/Devices: None  Discharge Condition: Stable CODE STATUS: DNR/DNI  Hospital Course: 78 year old female with history of previous stroke, depression, DM-2, peripheral neuropathy, CAD, left eye cataract surgery and migraine presenting with acute vision loss mainly on the left, and posterior throbbing headache radiating to her right temple.  Reportedly had DBP in 30s and 40s at home.  In ED, BP 150/50.  CBG 194.  Creatinine 3.72.  CT head significant for acute/subacute right PCA territory infarct affecting the right occipital lobe.  Neurology consulted and patient was admitted for CVA.  Patient had MRI brain that revealed acute right occipital infarct.   Patient had acute respiratory distress with desaturation to 70% on 5 L requiring NRB.  She was given IV Lasix with improvement in her breathing and weaned down to 4 L by nasal cannula.  Currently saturating at 100%.  ABG not remarkable.  On the day of discharge, respiratory failure resolved.  Vision improved.  Cleared by neurology and cardiology for outpatient follow-up.  Evaluated by PT/OT who recommended outpatient PT.  See individual problem list below for more on hospital course.  Discharge Diagnoses:  Acute right PICA territory CVA/left hemianopsia/left paresthesia: Left hemianopsia improved.  Headache and paresthesia resolved.    Vision improved. -MRI brain-acute right occipital infarct,  -MRA-right M1  stent obscuring right MCA, normal right PCA and occluded distal branch on left -TTE with EF of 60 to 65%, severe AS, no embolus -Received aspirin 325 mg on admission. -LE Doppler negative for DVT -Carotid Doppler without flow obstructing stenosis -Received full dose aspirin on admission. -DAPT (Plavix 75 mg and ASA 81 mg) daily for 3 weeks and then Plavix alone. -Permissive hypertension for 5 to 7 days-all BP meds discontinued except torsemide. -Cardiology to arrange 30-day event monitor -PT/OT/SLP-outpatient therapy -Appreciate neurology guidance-signed off.  Outpatient follow-up  Acute respiratory failure with hypoxia likely due to CHF exacerbation in the setting of severe aortic stenosis.  Resolved.  Currently on room air. -Continue home torsemide. -Manage CHF exacerbation as below.  Diastolic CHF exacerbation/severe aortic stenosis: Echo with EF of 60 to 65%, severe asymmetric LVH, severe AS and moderately elevated RV pressure.  Initial CXR concerning for CHF.  Lung exam with bibasilar crackles now resolved.   Good response to p.o. torsemide. -Discharged on p.o. torsemide per cardiology recommendation -Cardiology to coordinate with nephrology about evaluation for possible TAVR.  Hypertension: BP was in fair range. -Labetalol and amlodipine discontinued except torsemide per cardiology recommendation.  Controlled IDDM-2 with renal complication and neuropathy: A1c 7.6%.  CBG was in fair range. -Discharged on home medications and Crestor  History of CAD: No chest pain but the dyspnea likely from underlying CHF and aortic stenosis. -Labetalol discontinued on discharge. -Statin, Plavix and aspirin as above  CKD-5/BMD: Renal function at baseline. -Continue home calcitriol and p.o. iron. -Followed by Dr. Hollie Salk.  History of rheumatoid arthritis/chronic pain: Stable -Continue home Vandiver. -PRN Tylenol and oxycodone.  Mood disorder: Stable -Continue home Pristiq and  nortriptyline.  Obesity: BMI 31.75. -Could benefit from GLP-1 inhibitors given  diabetes and cardiac history.  Chronic pain: -PRN Percocet and Tylenol.   Discharge Instructions  Discharge Instructions    (HEART FAILURE PATIENTS) Call MD:  Anytime you have any of the following symptoms: 1) 3 pound weight gain in 24 hours or 5 pounds in 1 week 2) shortness of breath, with or without a dry hacking cough 3) swelling in the hands, feet or stomach 4) if you have to sleep on extra pillows at night in order to breathe.   Complete by: As directed    Ambulatory referral to Neurology   Complete by: As directed    Follow up with stroke clinic NP (Jessica Vanschaick or Cecille Rubin, if both not available, consider Zachery Dauer, or A[place]) at [facility] in about 4 weeks. Thanks.   Call MD for:   Complete by: As directed    New weakness, numbness or tingling in arms or legs, new vision change or other symptoms concerning to you   Call MD for:  difficulty breathing, headache or visual disturbances   Complete by: As directed    Call MD for:  extreme fatigue   Complete by: As directed    Call MD for:  persistant dizziness or light-headedness   Complete by: As directed    Call MD for:  persistant nausea and vomiting   Complete by: As directed    Call MD for:  severe uncontrolled pain   Complete by: As directed    Diet - low sodium heart healthy   Complete by: As directed    Diet Carb Modified   Complete by: As directed    Discharge instructions   Complete by: As directed    It has been a pleasure taking care of you! You were admitted with vision changes/loss in your left eye due to stroke.  You were started on Plavix and aspirin in addition to your cholesterol medication.  We have stopped some of your blood pressure medications due to low blood pressures.  Please review your new medication list and the directions before you take your medications. Please follow-up with your cardiologist to discuss  about you heart valve issue. Also follow-up with nephrology to discuss options about your kidney and preparation for evaluation for your heart valve replacement. Follow-up with neurology as recommended.   Take care,   Increase activity slowly   Complete by: As directed      Allergies as of 09/12/2018   No Known Allergies     Medication List    STOP taking these medications   amLODipine 10 MG tablet Commonly known as: NORVASC   labetalol 300 MG tablet Commonly known as: NORMODYNE   lisinopril 5 MG tablet Commonly known as: ZESTRIL     TAKE these medications   acetaminophen 325 MG tablet Commonly known as: TYLENOL Take 650 mg by mouth every 6 (six) hours as needed (pain/headaches.).   allopurinol 100 MG tablet Commonly known as: ZYLOPRIM Take 50 mg by mouth daily. Notes to patient: Next dose 8/27 am   aspirin EC 81 MG tablet Take 81 mg by mouth daily. Notes to patient: Next dose 8/27 am   B-D INS SYR ULTRAFINE 1CC/30G 30G X 1/2" 1 ML Misc Generic drug: Insulin Syringe-Needle U-100 Inject 1 each into the skin 3 (three) times daily between meals.   calcitRIOL 0.25 MCG capsule Commonly known as: ROCALTROL Take 0.5 mcg by mouth daily. Notes to patient: Next dose 8/27 am   calcium carbonate 1500 (600 Ca) MG Tabs tablet Commonly known  as: OSCAL Take 600 mg by mouth daily. Notes to patient: Next dose 8/27 am   clopidogrel 75 MG tablet Commonly known as: PLAVIX Take 1 tablet (75 mg total) by mouth daily. Notes to patient: Next dose 99991111 am   folic acid 1 MG tablet Commonly known as: FOLVITE Take 1 mg by mouth daily. Notes to patient: Next dose 8/27 am   HumaLOG KwikPen 100 UNIT/ML KwikPen Generic drug: insulin lispro 4-6 Units by Subconjunctival route 3 (three) times daily before meals. Sliding Scale Insulin   insulin glargine 100 UNIT/ML injection Commonly known as: LANTUS Inject 12 Units into the skin at bedtime.   Iron 142 (45 Fe) MG Tbcr Take 142 mg  by mouth 2 (two) times daily. Notes to patient: Next dose 8/26 evening/bedtime   leflunomide 10 MG tablet Commonly known as: ARAVA Take 10 mg by mouth daily. Notes to patient: Next dose 8/27 am   Magnesium 250 MG Tabs Take 250 mg by mouth daily. Notes to patient: Next dose 8/27 am   niacin 500 MG CR tablet Commonly known as: NIASPAN Take 500 mg by mouth daily. Notes to patient: Next dose 8/27 am   nitroGLYCERIN 0.4 MG SL tablet Commonly known as: NITROSTAT PLACE 1 TABLET (0.4 MG TOTAL) UNDER THE TONGUE EVERY 5 MINUTES X 3 DOSES AS NEEDED FOR CHEST PAIN.   nortriptyline 10 MG capsule Commonly known as: PAMELOR Take 10 mg by mouth daily. Notes to patient: Next dose 8/27 am   omeprazole 40 MG capsule Commonly known as: PRILOSEC Take 40 mg by mouth daily.   ONE TOUCH ULTRA TEST test strip Generic drug: glucose blood 1 each by Other route 3 (three) times daily between meals.   oxyCODONE-acetaminophen 5-325 MG tablet Commonly known as: Percocet Take 1 tablet by mouth every 6 (six) hours as needed for severe pain.   Pristiq 100 MG 24 hr tablet Generic drug: desvenlafaxine Take 100 mg by mouth daily.   rosuvastatin 20 MG tablet Commonly known as: CRESTOR Take 20 mg by mouth daily. Notes to patient: Next dose 8/26 evening/bedtime   Tears Again 1.4 % ophthalmic solution Generic drug: polyvinyl alcohol Place 1 drop into both eyes 3 (three) times daily as needed for dry eyes.   torsemide 20 MG tablet Commonly known as: DEMADEX Take 1 tablet (20 mg total) by mouth daily. May take additional dose in the afternoon for swelling, shortness of breath or weight gain. What changed: additional instructions Notes to patient: Next dose 8/27 am      Follow-up Information    [facility]. Schedule an appointment as soon as possible for a visit in 4 week(s).   Specialty: Neurology Contact information: [address] [place] [phone]       Merrilee Seashore, MD. Schedule an appointment as soon as possible for a visit in 1 week(s).   Specialty: Internal Medicine Contact information: [address] [place] [address] [phone]        Adrian Prows, MD. Schedule an appointment as soon as possible for a visit in 1 week(s).   Specialty: Cardiology Contact information: [place] [address] [phone]           Consultations: -Neurology -Cardiology  Procedures/Studies:  2D Echo: 1. The left ventricle has normal systolic function with an ejection fraction of 60-65%. The cavity size was normal. There is severe asymmetric left ventricular hypertrophy with mild concentric LV hypertrophy.  2.  The right ventricle has normal systolic function. The cavity was normal. There is no increase in right ventricular wall thickness. Right ventricular systolic pressure is moderately elevated.  3. Left atrial size was mildly dilated.  4. The aortic valve is tricuspid. Moderate thickening of the aortic valve. Severe calcifcation of the aortic valve. Aortic valve regurgitation is moderate by color flow Doppler. Severe stenosis of the aortic valve. Moderate aortic annular calcification  noted. AV Vmax: 379.20 cm/s. AV Mean Grad: 54mmHg . AV Area (VTI): 0.42 cm. LVOT/AV VTI ratio: 0.23.  5. The aorta is normal unless otherwise noted.  6. The inferior vena cava was dilated in size with >50% respiratory variability.  Ct Head Wo Contrast  Result Date: 09/09/2018 CLINICAL DATA:  78 year old female with severe headache onset this morning and loss of vision. EXAM: CT HEAD WITHOUT CONTRAST TECHNIQUE: Contiguous axial images were obtained from the base of the skull through the vertex without intravenous contrast. COMPARISON:  Brain MRI 07/17/2010. Head CT 07/22/2009. FINDINGS: Brain: Chronic encephalomalacia in the posterosuperior right frontal lobe appears not  significantly changed since 2012. However, there is a 3 centimeter area of hypodensity most compatible with cytotoxic edema in the right occipital lobe on series 3, image 12. this area was normal in 2012. No associated hemorrhage. No significant mass effect. Elsewhere gray-white matter differentiation is within normal limits. No midline shift, ventriculomegaly, mass effect, evidence of mass lesion. Vascular: Calcified atherosclerosis at the skull base. There is also a chronic right MCA M1 stent vascular stent visible on series 6, image 24. No suspicious intracranial vascular hyperdensity. Skull: No acute osseous abnormality identified. Sinuses/Orbits: Visualized paranasal sinuses and mastoids are stable and well pneumatized. Other: Postoperative changes to both globes. No acute orbit or scalp soft tissue finding. IMPRESSION: 1. Positive for acute to subacute Right PCA territory infarct affecting the right occipital lobe. No associated hemorrhage or mass effect. 2. No other acute intracranial abnormality. Chronic posterior right MCA territory infarct and right M1 vascular stent. Electronically Signed   By: Genevie Ann [name]   On: 09/09/2018 17:02   Mr Angio Head Wo Contrast  Result Date: 09/10/2018 CLINICAL DATA:  Focal neuro deficit greater than 6 hours. Suspect stroke EXAM: MRI HEAD WITHOUT CONTRAST MRA HEAD WITHOUT CONTRAST TECHNIQUE: Multiplanar, multiecho pulse sequences of the brain and surrounding structures were obtained without intravenous contrast. Angiographic images of the head were obtained using MRA technique without contrast. COMPARISON:  CT head 09/09/2018.  MRI 07/17/2010 FINDINGS: MRI HEAD FINDINGS Brain: Acute infarct right occipital lobe in the inferior portion. No other acute infarct identified. Mild atrophy. Negative for hydrocephalus. Chronic infarct in the right posterior frontal lobe. Chronic microhemorrhage in the right parietal white matter. Vascular: Stent in the right M1 segment causing  artifact. Otherwise normal arterial flow voids at the skull base. Skull and upper cervical spine: Negative Sinuses/Orbits: Mild mucosal edema paranasal sinuses. Bilateral cataract surgery. Other: None MRA HEAD FINDINGS Both vertebral arteries patent to the basilar. Basilar widely patent. PICA not visualized. Prominent right AICA patent. Fetal origin left posterior cerebral artery. Branch occlusion of left P3 branch. Mild stenosis right P2 segment. Right P3 segments patent without significant stenosis. Internal carotid artery patent bilaterally. Anterior cerebral arteries widely patent bilaterally. Mild stenosis left M1 segment. Left MCA branches widely patent. Loss of flow related signal right middle cerebral artery throughout its course due to artifact from stent. Distal flow evaluation not adequately assessed by this study. IMPRESSION: 1. Acute infarct right occipital lobe. No associated  hemorrhage in the infarct. Chronic microhemorrhage right parietal white matter appears separate. 2. Right M1 stent obscures evaluation of the right middle cerebral artery on MRA 3. Right posterior cerebral artery patent. 4. Occluded distal P3 branch on the left. Electronically Signed   By: Franchot Gallo [name]   On: 09/10/2018 18:05   Mr Brain Wo Contrast  Result Date: 09/10/2018 CLINICAL DATA:  Focal neuro deficit greater than 6 hours. Suspect stroke EXAM: MRI HEAD WITHOUT CONTRAST MRA HEAD WITHOUT CONTRAST TECHNIQUE: Multiplanar, multiecho pulse sequences of the brain and surrounding structures were obtained without intravenous contrast. Angiographic images of the head were obtained using MRA technique without contrast. COMPARISON:  CT head 09/09/2018.  MRI 07/17/2010 FINDINGS: MRI HEAD FINDINGS Brain: Acute infarct right occipital lobe in the inferior portion. No other acute infarct identified. Mild atrophy. Negative for hydrocephalus. Chronic infarct in the right posterior frontal lobe. Chronic microhemorrhage in the right  parietal white matter. Vascular: Stent in the right M1 segment causing artifact. Otherwise normal arterial flow voids at the skull base. Skull and upper cervical spine: Negative Sinuses/Orbits: Mild mucosal edema paranasal sinuses. Bilateral cataract surgery. Other: None MRA HEAD FINDINGS Both vertebral arteries patent to the basilar. Basilar widely patent. PICA not visualized. Prominent right AICA patent. Fetal origin left posterior cerebral artery. Branch occlusion of left P3 branch. Mild stenosis right P2 segment. Right P3 segments patent without significant stenosis. Internal carotid artery patent bilaterally. Anterior cerebral arteries widely patent bilaterally. Mild stenosis left M1 segment. Left MCA branches widely patent. Loss of flow related signal right middle cerebral artery throughout its course due to artifact from stent. Distal flow evaluation not adequately assessed by this study. IMPRESSION: 1. Acute infarct right occipital lobe. No associated hemorrhage in the infarct. Chronic microhemorrhage right parietal white matter appears separate. 2. Right M1 stent obscures evaluation of the right middle cerebral artery on MRA 3. Right posterior cerebral artery patent. 4. Occluded distal P3 branch on the left. Electronically Signed   By: Franchot Gallo [name]   On: 09/10/2018 18:05   Dg Chest Port 1 View  Result Date: 09/10/2018 CLINICAL DATA:  78 year old female with increasing shortness of breath. EXAM: PORTABLE CHEST 1 VIEW COMPARISON:  None. FINDINGS: There is mild cardiomegaly with vascular congestion and edema. Small bilateral pleural effusions with associated bibasilar atelectasis. Pneumonia is not excluded. No pneumothorax. No acute osseous pathology. IMPRESSION: Mild cardiomegaly with findings of CHF and small bilateral pleural effusions. Electronically Signed   By: Anner Crete [name]   On: 09/10/2018 02:09   Vas US Carotid  Result Date: 09/11/2018 Carotid Arterial Duplex Study Indications:        CVA. Risk Factors:      None. Limitations        Today's exam was limited due to the high bifurcation of the                    carotid and Vessel tortuosity. Comparison Study:  No prior studies. Performing Technologist: Oliver Hum RVT  Examination Guidelines: A complete evaluation includes B-mode imaging, spectral Doppler, color Doppler, and power Doppler as needed of all accessible portions of each vessel. Bilateral testing is considered an integral part of a complete examination. Limited examinations for reoccurring indications may be performed as noted.  Right Carotid Findings: +----------+--------+-------+--------+--------------------------------+--------+             PSV cm/s EDV     Stenosis Plaque Description  Comments                       cm/s                                                        +----------+--------+-------+--------+--------------------------------+--------+  CCA Prox   62       0                smooth and heterogenous          tortuous  +----------+--------+-------+--------+--------------------------------+--------+  CCA Distal 43       6                smooth and heterogenous                    +----------+--------+-------+--------+--------------------------------+--------+  ICA Prox   39       5                smooth, heterogenous and                                                         calcific                                   +----------+--------+-------+--------+--------------------------------+--------+  ICA Distal 45       7                                                 tortuous  +----------+--------+-------+--------+--------------------------------+--------+  ECA        35       6                                                           +----------+--------+-------+--------+--------------------------------+--------+ +----------+--------+-------+--------+-------------------+             PSV cm/s EDV cms Describe Arm Pressure (mmHG)   +----------+--------+-------+--------+-------------------+  Subclavian 112                                            +----------+--------+-------+--------+-------------------+ +---------+--------+--+--------+-+---------+  Vertebral PSV cm/s 57 EDV cm/s 8 Antegrade  +---------+--------+--+--------+-+---------+   Left Carotid Findings: +----------+--------+--------+--------+-----------------------+--------+             PSV cm/s EDV cm/s Stenosis Plaque Description      Comments  +----------+--------+--------+--------+-----------------------+--------+  CCA Prox   53       0                 smooth and heterogenous tortuous  +----------+--------+--------+--------+-----------------------+--------+  CCA Distal 54       5  smooth and heterogenous           +----------+--------+--------+--------+-----------------------+--------+  ICA Prox   94       9                 smooth and heterogenous tortuous  +----------+--------+--------+--------+-----------------------+--------+  ICA Distal 107      14                                        tortuous  +----------+--------+--------+--------+-----------------------+--------+  ECA        84       0                                                   +----------+--------+--------+--------+-----------------------+--------+ +----------+--------+--------+--------+-------------------+  Subclavian PSV cm/s EDV cm/s Describe Arm Pressure (mmHG)  +----------+--------+--------+--------+-------------------+             193      38                                     +----------+--------+--------+--------+-------------------+ +---------+--------+--+--------+-+---------+  Vertebral PSV cm/s 48 EDV cm/s 6 Antegrade  +---------+--------+--+--------+-+---------+   Summary: Right Carotid: Velocities in the right ICA are consistent with a 1-39% stenosis. Left Carotid: Velocities in the left ICA are consistent with a 1-39% stenosis. Vertebrals: Bilateral vertebral arteries demonstrate  antegrade flow. *See table(s) above for measurements and observations.  Electronically signed by Antony Contras MD on 09/11/2018 at 8:15:19 AM.    Final    Vas [name] Lower Extremity Venous (dvt)  Result Date: 09/10/2018  Lower Venous Study Indications: CVA.  Risk Factors: None identified. Comparison Study: No prior studies. Performing Technologist: Oliver Hum RVT  Examination Guidelines: A complete evaluation includes B-mode imaging, spectral Doppler, color Doppler, and power Doppler as needed of all accessible portions of each vessel. Bilateral testing is considered an integral part of a complete examination. Limited examinations for reoccurring indications may be performed as noted.  +---------+---------------+---------+-----------+----------+--------------+  RIGHT     Compressibility Phasicity Spontaneity Properties Thrombus Aging  +---------+---------------+---------+-----------+----------+--------------+  CFV       Full            Yes       Yes                                    +---------+---------------+---------+-----------+----------+--------------+  SFJ       Full                                                             +---------+---------------+---------+-----------+----------+--------------+  FV Prox   Full                                                             +---------+---------------+---------+-----------+----------+--------------+  FV Mid    Full                                                             +---------+---------------+---------+-----------+----------+--------------+  FV Distal Full                                                             +---------+---------------+---------+-----------+----------+--------------+  PFV       Full                                                             +---------+---------------+---------+-----------+----------+--------------+  POP       Full            Yes       Yes                                     +---------+---------------+---------+-----------+----------+--------------+  PTV       Full                                                             +---------+---------------+---------+-----------+----------+--------------+  PERO      Full                                                             +---------+---------------+---------+-----------+----------+--------------+   +---------+---------------+---------+-----------+----------+--------------+  LEFT      Compressibility Phasicity Spontaneity Properties Thrombus Aging  +---------+---------------+---------+-----------+----------+--------------+  CFV       Full            Yes       Yes                                    +---------+---------------+---------+-----------+----------+--------------+  SFJ       Full                                                             +---------+---------------+---------+-----------+----------+--------------+  FV Prox   Full                                                             +---------+---------------+---------+-----------+----------+--------------+  FV Mid    Full                                                             +---------+---------------+---------+-----------+----------+--------------+  FV Distal Full                                                             +---------+---------------+---------+-----------+----------+--------------+  PFV       Full                                                             +---------+---------------+---------+-----------+----------+--------------+  POP       Full            Yes       Yes                                    +---------+---------------+---------+-----------+----------+--------------+  PTV       Full                                                             +---------+---------------+---------+-----------+----------+--------------+  PERO      Full                                                              +---------+---------------+---------+-----------+----------+--------------+     Summary: Right: There is no evidence of deep vein thrombosis in the lower extremity. No cystic structure found in the popliteal fossa. Left: There is no evidence of deep vein thrombosis in the lower extremity. No cystic structure found in the popliteal fossa.  *See table(s) above for measurements and observations. Electronically signed by Ruta Hinds MD on 09/10/2018 at 6:53:43 PM.    Final       Subjective: No major events overnight of this morning.  No complaint this morning.  Reports improvement in the vision in her left eye.  No other focal neuro symptoms.  She denies chest pain, dyspnea, palpitation, GI or GU symptoms.   Discharge Exam: Vitals:   09/12/18 0409 09/12/18 1200  BP: (!) 157/50 (!) 130/55  Pulse: 69 70  Resp: 18 18  Temp: 97.8 F (36.6 C) 98.5 F (36.9 C)  SpO2: 95% 95%    GENERAL: No acute distress.  Sitting on bedside chair. HEENT: MMM.  Vision and hearing grossly intact.  Left upper quadrantanopia.  Left lower quadrant decreased visual acuity.  Irregular left pupil NECK: Supple.  Carotid  bruits. LUNGS:  No IWOB. Good air movement bilaterally. HEART:  RRR.  3/6 SEM over RUSB radiating to her neck.  3/6 SEM over LUSB and LLSB.  ABD: Bowel sounds present. Soft. Non tender.  MSK/EXT:  Moves all extremities. No apparent deformity. No edema bilaterally. SKIN: no apparent skin lesion or wound NEURO: Awake, alert and oriented appropriately.  Visual deficit as above.  Left pupil as above.  Motor, sensation, patellar reflex unremarkable.  No pronator drift.  Bessey-to-nose intact. PSYCH: Calm. Normal affect.     The results of significant diagnostics from this hospitalization (including imaging, microbiology, ancillary and laboratory) are listed below for reference.     Microbiology: Recent Results (from the past 240 hour(s))  SARS CORONAVIRUS 2 Nasal Swab Aptima Multi Swab     Status:  None   Collection Time: 09/09/18  6:51 PM   Specimen: Aptima Multi Swab; Nasal Swab  Result Value Ref Range Status   SARS Coronavirus 2 NEGATIVE NEGATIVE Final    Comment: (NOTE) SARS-CoV-2 target nucleic acids are NOT DETECTED. The SARS-CoV-2 RNA is generally detectable in upper and lower respiratory specimens during the acute phase of infection. Negative results do not preclude SARS-CoV-2 infection, do not rule out co-infections with other pathogens, and should not be used as the sole basis for treatment or other patient management decisions. Negative results must be combined with clinical observations, patient history, and epidemiological information. The expected result is Negative. Fact Sheet for Patients: SugarRoll.be Fact Sheet for Healthcare Providers: https://www.woods-mathews.com/ This test is not yet approved or cleared by the [place] FDA and  has been authorized for detection and/or diagnosis of SARS-CoV-2 by FDA under an Emergency Use Authorization (EUA). This EUA will remain  in effect (meaning this test can be used) for the duration of the COVID-19 declaration under Section 56 4(b)(1) of the Act, 21 U.S.C. section 360bbb-3(b)(1), unless the authorization is terminated or revoked sooner. Performed at [facility], [place] [address], [place], [place] [address]      Labs: BNP (last 3 results) No results for input(s): BNP in the last 8760 hours. Basic Metabolic Panel: Recent Labs  Lab 09/09/18 1639 09/09/18 1655 09/10/18 0213 09/10/18 0507 09/11/18 0348 09/12/18 0316  NA 138 134* 135 137 137 135  K 4.4 4.5 4.2 4.7 4.3 4.1  CL 100 101  --  99 100 99  CO2 25  --   --  22 27 24   GLUCOSE 194* 187*  --  183* 107* 127*  BUN 39* 47*  --  38* 41* 42*  CREATININE 3.72* 3.80*  --  3.61* 3.68* 3.37*  CALCIUM 10.1  --   --  9.4 9.1 9.2  MG  --   --   --   --  2.1 2.1  PHOS  --   --   --  4.3  --  3.2   Liver  Function Tests: Recent Labs  Lab 09/09/18 1639 09/10/18 0507  AST 25  --   ALT 19  --   ALKPHOS 72  --   BILITOT 0.7  --   PROT 6.8  --   ALBUMIN 3.5 3.3*   No results for input(s): LIPASE, AMYLASE in the last 168 hours. No results for input(s): AMMONIA in the last 168 hours. CBC: Recent Labs  Lab 09/09/18 1639 09/09/18 1655 09/10/18 0213 09/10/18 0638 09/11/18 0348 09/12/18 0316  WBC 8.7  --   --  11.6* 9.9 9.6  NEUTROABS 6.5  --   --   --   --   --  HGB 10.7* 11.2* 12.2 11.4* 10.5* 10.7*  HCT 33.7* 33.0* 36.0 36.2 32.9* 32.8*  MCV 94.4  --   --  96.3 94.0 92.1  PLT 176  --   --  195 196 197   Cardiac Enzymes: No results for input(s): CKTOTAL, CKMB, CKMBINDEX, TROPONINI in the last 168 hours. BNP: Invalid input(s): POCBNP CBG: Recent Labs  Lab 09/11/18 1558 09/11/18 2050 09/12/18 0215 09/12/18 0830 09/12/18 1256  GLUCAP 198* 135* 148* 100* 148*   D-Dimer No results for input(s): DDIMER in the last 72 hours. Hgb A1c Recent Labs    09/10/18 0639  HGBA1C 7.6*   Lipid Profile Recent Labs    09/10/18 0507  CHOL 147  HDL 76  LDLCALC 58  TRIG 64  CHOLHDL 1.9   Thyroid function studies No results for input(s): TSH, T4TOTAL, T3FREE, THYROIDAB in the last 72 hours.  Invalid input(s): FREET3 Anemia work up No results for input(s): VITAMINB12, FOLATE, FERRITIN, TIBC, IRON, RETICCTPCT in the last 72 hours. Urinalysis    Component Value Date/Time   COLORURINE YELLOW 07/23/2009 0528   APPEARANCEUR CLEAR 07/23/2009 0528   LABSPEC 1.013 07/23/2009 0528   PHURINE 6.0 07/23/2009 0528   GLUCOSEU NEGATIVE 07/23/2009 0528   HGBUR NEGATIVE 07/23/2009 0528   BILIRUBINUR NEGATIVE 07/23/2009 0528   KETONESUR NEGATIVE 07/23/2009 0528   PROTEINUR NEGATIVE 07/23/2009 0528   UROBILINOGEN 0.2 07/23/2009 0528   NITRITE NEGATIVE 07/23/2009 0528   LEUKOCYTESUR SMALL (A) 07/23/2009 0528   Sepsis Labs Invalid input(s): PROCALCITONIN,  WBC,  LACTICIDVEN   Time  coordinating discharge: 40 minutes  SIGNED:  Mercy Riding, MD  [facility] 09/12/2018, 4:06 PM  If 7PM-7AM, please contact night-coverage [email or web address] Password [email or web address]

## 2018-09-12 NOTE — Progress Notes (Signed)
Pt foley was removed and pt voided post foley removal.

## 2018-09-12 NOTE — Plan of Care (Signed)
  Problem: Education: Goal: Knowledge of disease or condition will improve 09/12/2018 1535 by Ethel Rana, RN Outcome: Completed/Met 09/12/2018 1124 by Ethel Rana, RN Outcome: Progressing Goal: Knowledge of secondary prevention will improve 09/12/2018 1535 by Ethel Rana, RN Outcome: Completed/Met 09/12/2018 1124 by Ethel Rana, RN Outcome: Progressing Goal: Knowledge of patient specific risk factors addressed and post discharge goals established will improve 09/12/2018 1535 by Ethel Rana, RN Outcome: Completed/Met 09/12/2018 1124 by Ethel Rana, RN Outcome: Progressing Goal: Individualized Educational Video(s) Outcome: Completed/Met   Problem: Coping: Goal: Will verbalize positive feelings about self Outcome: Completed/Met Goal: Will identify appropriate support needs Outcome: Completed/Met   Problem: Health Behavior/Discharge Planning: Goal: Ability to manage health-related needs will improve Outcome: Completed/Met   Problem: Self-Care: Goal: Ability to participate in self-care as condition permits will improve Outcome: Completed/Met Goal: Verbalization of feelings and concerns over difficulty with self-care will improve Outcome: Completed/Met Goal: Ability to communicate needs accurately will improve Outcome: Completed/Met   Problem: Nutrition: Goal: Risk of aspiration will decrease 09/12/2018 1535 by Ethel Rana, RN Outcome: Completed/Met 09/12/2018 1124 by Ethel Rana, RN Outcome: Progressing Goal: Dietary intake will improve Outcome: Completed/Met   Problem: Ischemic Stroke/TIA Tissue Perfusion: Goal: Complications of ischemic stroke/TIA will be minimized Outcome: Completed/Met

## 2018-09-12 NOTE — Progress Notes (Signed)
Physical Therapy Treatment Patient Details Name: [name] MRN: [id number] DOB: 10/01/1940 Today's Date: 09/12/2018    History of Present Illness 78 year old female with history of previous stroke, depression, DM-2, peripheral neuropathy, CAD, left eye cataract surgery and migraine presenting with acute vision loss mainly on the left, and posterior throbbing headache radiating to her right temple.  Reportedly had DBP in 30s and 40s at home. Admitted for treatment of Acute right PICA territory CVA/left hemianopsia and Diastolic CHF exacerbation 09/09/18    PT Comments    Continuing work on functional mobility and activity tolerance;  Able to walk slowly, but safely; guarded gait, and will benefit from going to Outpt Neuro PT for gait smoothness; We discussed getting an Outpt PT referral from her primary care physician, and she can schedule appointment at her convenience; OK for dc home from PT standpoint    Follow Up Recommendations  Outpatient PT;Supervision/Assistance - 24 hour;Other (comment)(Neuro PT/ assist on intial d/c )     Equipment Recommendations  None recommended by PT    Recommendations for Other Services       Precautions / Restrictions Precautions Precaution Comments: left homonymous heminaposia---appears upper quadrant is more involved than lower    Mobility  Bed Mobility Overal bed mobility: Independent                Transfers Overall transfer level: Needs assistance Equipment used: None Transfers: Sit to/from Stand Sit to Stand: Modified independent (Device/Increase time)         General transfer comment: Overall no difficulty noted  Ambulation/Gait Ambulation/Gait assistance: Supervision;Modified independent (Device/Increase time) Gait Distance (Feet): 200 Feet Assistive device: None Gait Pattern/deviations: Step-through pattern;Decreased step length - right;Decreased step length - left;Drifts right/left;Decreased dorsiflexion - left      General Gait Details: Slow pace, but steady; less gait assymetry noted, but her steps are still guarded; no obvious effects of L visual field deficit with ambulation    Stairs             Wheelchair Mobility    Modified Rankin (Stroke Patients Only) Modified Rankin (Stroke Patients Only) Pre-Morbid Rankin Score: No significant disability Modified Rankin: No significant disability     Balance Overall balance assessment: No apparent balance deficits (not formally assessed)                                          Cognition Arousal/Alertness: Awake/alert Behavior During Therapy: WFL for tasks assessed/performed Overall Cognitive Status: Within Functional Limits for tasks assessed                                        Exercises      General Comments General comments (skin integrity, edema, etc.): Reinforced stroke education (signs and symptoms of a stroke and risk factors/modification); pt verbalized understanding      Pertinent Vitals/Pain Pain Assessment: No/denies pain    Home Living                      Prior Function            PT Goals (current goals can now be found in the care plan section) Acute Rehab PT Goals Patient Stated Goal: get vision back to normal PT Goal Formulation: With patient Time For Goal  Achievement: 09/25/18 Potential to Achieve Goals: Fair Progress towards PT goals: Progressing toward goals    Frequency    Min 4X/week      PT Plan Current plan remains appropriate    Co-evaluation              AM-PAC PT "6 Clicks" Mobility   Outcome Measure  Help needed turning from your back to your side while in a flat bed without using bedrails?: None Help needed moving from lying on your back to sitting on the side of a flat bed without using bedrails?: None Help needed moving to and from a bed to a chair (including a wheelchair)?: None Help needed standing up from a chair using your  arms (e.g., wheelchair or bedside chair)?: None Help needed to walk in hospital room?: None Help needed climbing 3-5 steps with a railing? : A Little 6 Click Score: 23    End of Session   Activity Tolerance: Patient tolerated treatment well Patient left: Other (comment)(safely walking to her room with her husband) Nurse Communication: Mobility status PT Visit Diagnosis: Unsteadiness on feet (R26.81);Other abnormalities of gait and mobility (R26.89);Muscle weakness (generalized) (M62.81);Difficulty in walking, not elsewhere classified (R26.2);Other symptoms and signs involving the nervous system (R29.898)     Time: [id number] PT Time Calculation (min) (ACUTE ONLY): 12 min  Charges:  $Gait Training: 8-22 mins                     Roney Marion, [place]  Acute Rehabilitation Services Pager [phone] Office [place] 09/12/2018, 4:32 PM

## 2018-09-13 ENCOUNTER — Other Ambulatory Visit: Payer: Medicare Other

## 2018-09-13 ENCOUNTER — Telehealth: Payer: Self-pay

## 2018-09-13 NOTE — Telephone Encounter (Signed)
Dr. Einar Gip, an MA from Dr. Cleone Slim called asking if you could call Dr. Cleone Slim to discusses about her case. His phone number is [phone]. Thank you

## 2018-09-14 NOTE — Telephone Encounter (Signed)
We discussed the timing of cardiac catheterization, patient on the verge of dialysis but would not do well in view of critical aortic stenosis also as hypotension becomes a major issue.  I discussed with Dr. Hollie Salk (Nephro) that she has had stroke recently and we would wait at least for 4 to 6 weeks prior to cardiac evaluation at this point.  Prior to setting up for cardiac catheterization I will discuss with Dr. Hollie Salk to see what would be the best strategy, we are thinking that she needs to be admitted prior day for hydration and to limit contrast use.  JG

## 2018-09-17 ENCOUNTER — Telehealth: Payer: Self-pay

## 2018-09-17 NOTE — Telephone Encounter (Signed)
Patient was recently seen in the hospital and she needs a 4 week follow up. Unable to find a slot to place her in. Please advise

## 2018-09-17 NOTE — Telephone Encounter (Signed)
Pt scheduled for 09/18/2018 at 10 30 am check in time of 10 am.

## 2018-09-18 ENCOUNTER — Encounter: Payer: Self-pay | Admitting: Neurology

## 2018-09-18 ENCOUNTER — Other Ambulatory Visit: Payer: Self-pay

## 2018-09-18 ENCOUNTER — Ambulatory Visit (INDEPENDENT_AMBULATORY_CARE_PROVIDER_SITE_OTHER): Payer: Medicare Other | Admitting: Neurology

## 2018-09-18 ENCOUNTER — Ambulatory Visit

## 2018-09-18 VITALS — BP 158/53 | HR 79 | Temp 98.0°F | Ht 62.0 in | Wt 160.0 lb

## 2018-09-18 DIAGNOSIS — I63431 Cerebral infarction due to embolism of right posterior cerebral artery: Secondary | ICD-10-CM

## 2018-09-18 DIAGNOSIS — I5033 Acute on chronic diastolic (congestive) heart failure: Secondary | ICD-10-CM

## 2018-09-18 DIAGNOSIS — I350 Nonrheumatic aortic (valve) stenosis: Secondary | ICD-10-CM

## 2018-09-18 DIAGNOSIS — G464 Cerebellar stroke syndrome: Secondary | ICD-10-CM

## 2018-09-18 DIAGNOSIS — I2510 Atherosclerotic heart disease of native coronary artery without angina pectoris: Secondary | ICD-10-CM

## 2018-09-18 DIAGNOSIS — Z952 Presence of prosthetic heart valve: Secondary | ICD-10-CM

## 2018-09-18 NOTE — Progress Notes (Addendum)
Reason for visit: Stroke  Referring physician: [facility] hospital  [name] is a 78 y.o. female  History of present illness:  [name] is a 78 year old right-handed black female with a history of cerebrovascular disease, she was seen through this office in 2015 for a right middle cerebral artery distribution stroke.  The patient had a right middle cerebral artery stent placement with subsequent occlusion.  She has done relatively well until just recently.  She was admitted to the hospital on 09 September 2018 with a sudden vision change.  MRI evaluation confirmed an acute right posterior cerebral artery distribution stroke.  The patient has a dense left homonymous visual field deficit as a consequence of this of stroke, she also has some sensation of imbalance with walking.  She presented with a headache but the headache has gone away at this point.  She reports no numbness or weakness of the extremities.  She denies any issues with speech or swallowing, she denies any change in memory.  She has no problems controlling the bowels or the bladder.  She was placed on a combination of aspirin and Plavix to switch to Plavix in 3 weeks.  A cardiac monitor study was placed.  The 2D echocardiogram confirmed the presence of severe aortic stenosis, the patient has a prominent cardiac murmur.  The patient will be followed through cardiology.  Past Medical History:  Diagnosis Date  . Anginal pain ([place])   . Arthritis   . Cerebrovascular disease   . CKD (chronic kidney disease)    Sees Dr Deterding  . Coronary artery disease   . Depression   . Diabetes ([place])    INSULIN DEPENDENT  . Diabetic peripheral neuropathy ([place])   . Diabetic retinopathy ([place])   . Diverticulitis   . Dyslipidemia   . GERD (gastroesophageal reflux disease)   . Headache(784.0) 01/30/2013  . Heart murmur   . History of cerebrovascular accident 01/30/2013  . Hyperlipidemia   . Hypertension   . Migraine   . Obesity   . Stroke  ([place])    x 2 no residulal  . TIA (transient ischemic attack)    " MULTIPLE PER PATIENT "; had left sided weakness- rehab and wants without assistance    Past Surgical History:  Procedure Laterality Date  . ABDOMINAL HYSTERECTOMY    . AV FISTULA PLACEMENT Left 12/18/2017   Procedure: ARTERIOVENOUS (AV) FISTULA CREATION ARM;  Surgeon: Marty Heck, MD;  Location: [facility];  Service: Vascular;  Laterality: Left;  . bilateral cataract surgery    . CARDIAC CATHETERIZATION  01/07/2014   DR [place] RESECTION  02/1999  . COLONOSCOPY    . COLOSTOMY  02/1999  . COLOSTOMY CLOSURE  07/1999  . EYE SURGERY Left 2019  . FISTULA SUPERFICIALIZATION Left 06/29/2018   Procedure: FISTULA SUPERFICIALIZATION LEFT BRACHIOCEPHALIC;  Surgeon: Marty Heck, MD;  Location: [facility];  Service: Vascular;  Laterality: Left;  . LEFT HEART CATHETERIZATION WITH CORONARY ANGIOGRAM N/A 01/07/2014   Procedure: LEFT HEART CATHETERIZATION WITH CORONARY ANGIOGRAM;  Surgeon: Laverda Page, MD;  Location: [facility] CATH [facility];  Service: Cardiovascular;  Laterality: N/A;  . middle cerebral artery stent placement Right   . OTHER SURGICAL HISTORY     laser surgery  . PTCA  01/07/2014   DES to RCA    DR Einar Gip  . right knee surgery Right    for infection    Family History  Problem Relation Age of Onset  .  Diabetes Mother   . Heart disease Mother   . Prostate cancer Father   . Hypertension Brother   . Prostate cancer Brother     Social history:  reports that she has never smoked. She has never used smokeless tobacco. She reports that she does not drink alcohol or use drugs.  Medications:  Prior to Admission medications   Medication Sig Start Date End Date Taking? Authorizing Provider  acetaminophen (TYLENOL) 325 MG tablet Take 650 mg by mouth every 6 (six) hours as needed (pain/headaches.).   Yes [provider]  allopurinol (ZYLOPRIM) 100 MG tablet Take 50 mg by mouth daily.   Yes [provider]  aspirin EC 81 MG tablet Take 81 mg by mouth daily.   Yes [provider]  B-D INS SYR ULTRAFINE 1CC/30G 30G X 1/2" 1 ML MISC Inject 1 each into the skin 3 (three) times daily between meals. 12/18/12  Yes [provider]  calcitRIOL (ROCALTROL) 0.25 MCG capsule Take 0.5 mcg by mouth daily.   Yes [provider]  calcium carbonate (OSCAL) 1500 (600 Ca) MG TABS tablet Take 600 mg by mouth daily.   Yes [provider]  clopidogrel (PLAVIX) 75 MG tablet Take 1 tablet (75 mg total) by mouth daily. 09/12/18  Yes Mercy Riding, MD  Ferrous Sulfate (IRON) 142 (45 Fe) MG TBCR Take 142 mg by mouth 2 (two) times daily.   Yes [provider]  folic acid (FOLVITE) 1 MG tablet Take 1 mg by mouth daily. 12/07/12  Yes [provider]  HUMALOG KWIKPEN 100 UNIT/ML KwikPen 4-6 Units by Subconjunctival route 3 (three) times daily before meals. Sliding Scale Insulin 05/28/18  Yes [provider]  insulin glargine (LANTUS) 100 UNIT/ML injection Inject 12 Units into the skin at bedtime.    Yes [provider]  leflunomide (ARAVA) 10 MG tablet Take 10 mg by mouth daily.   Yes [provider]  Magnesium 250 MG TABS Take 250 mg by mouth daily.   Yes [provider]  niacin (NIASPAN) 500 MG CR tablet Take 500 mg by mouth daily.  01/29/18  Yes [provider]  nitroGLYCERIN (NITROSTAT) 0.4 MG SL tablet PLACE 1 TABLET (0.4 MG TOTAL) UNDER THE TONGUE EVERY 5 MINUTES X 3 DOSES AS NEEDED FOR CHEST PAIN. 09/06/18  Yes Adrian Prows, MD  nortriptyline (PAMELOR) 10 MG capsule Take 10 mg by mouth daily. 12/17/12  Yes [provider]  omeprazole (PRILOSEC) 40 MG capsule Take 40 mg by mouth daily.   Yes [provider]  ONE TOUCH ULTRA TEST test strip 1 each by Other route 3 (three) times daily between meals. 12/18/12  Yes [provider]  oxyCODONE-acetaminophen (PERCOCET) 5-325 MG tablet Take 1 tablet by  mouth every 6 (six) hours as needed for severe pain. 06/29/18  Yes Rhyne, Hulen Shouts, PA-C  polyvinyl alcohol (TEARS AGAIN) 1.4 % ophthalmic solution Place 1 drop into both eyes 3 (three) times daily as needed for dry eyes.   Yes [provider]  PRISTIQ 100 MG 24 hr tablet Take 100 mg by mouth daily.  12/29/12  Yes [provider]  rosuvastatin (CRESTOR) 20 MG tablet Take 20 mg by mouth daily.   Yes [provider]  torsemide (DEMADEX) 20 MG tablet Take 1 tablet (20 mg total) by mouth daily. May take additional dose in the afternoon for swelling, shortness of breath or weight gain. 09/12/18  Yes Mercy Riding, MD  No Known Allergies  ROS:  Out of a complete 14 system review of symptoms, the patient complains only of the following symptoms, and all other reviewed systems are negative.  Walking problems Loss of vision Fatigue  Blood pressure (!) 158/53, pulse 79, temperature 98 F (36.7 C), temperature source Temporal, height 5\' 2"  (1.575 m), weight 160 lb (72.6 kg).  Physical Exam  General: The patient is alert and cooperative at the time of the examination.  Eyes: Pupils are round on the right, reactive, postsurgical on the left, irregular. Discs are flat bilaterally.  Neck: The neck is supple, no carotid bruits are noted, but some radiation of the heart murmur is noted in both carotid arteries.  Respiratory: The respiratory examination is clear.  Cardiovascular: The cardiovascular examination reveals a regular rate and rhythm, a grade III/VI systolic ejection murmur at the aortic area is noted.  Skin: Extremities are without significant edema.  Neurologic Exam  Mental status: The patient is alert and oriented x 3 at the time of the examination. The patient has apparent normal recent and remote memory, with an apparently normal attention span and concentration ability.  Cranial nerves: Facial symmetry is present. There is good sensation of the face  to pinprick and soft touch bilaterally. The strength of the facial muscles and the muscles to head turning and shoulder shrug are normal bilaterally. Speech is well enunciated, no aphasia or dysarthria is noted. Extraocular movements are full. Visual fields are notable for dense left homonymous visual field deficit. The tongue is midline, and the patient has symmetric elevation of the soft palate. No obvious hearing deficits are noted.  Motor: The motor testing reveals 5 over 5 strength of all 4 extremities. Good symmetric motor tone is noted throughout.  Sensory: Sensory testing is intact to pinprick, soft touch, vibration sensation, and position sense on all 4 extremities. No evidence of extinction is noted.  Coordination: Cerebellar testing reveals good Paules-nose-Dingwall and heel-to-shin bilaterally.  Gait and station: Gait is slightly wide-based, unsteady. Tandem gait is unsteady. Romberg is negative. No drift is seen.  Reflexes: Deep tendon reflexes are symmetric and normal bilaterally. Toes are downgoing bilaterally.   MRI brain/ MRA head 09/10/18:  IMPRESSION: 1. Acute infarct right occipital lobe. No associated hemorrhage in the infarct. Chronic microhemorrhage right parietal white matter appears separate. 2. Right M1 stent obscures evaluation of the right middle cerebral artery on MRA 3. Right posterior cerebral artery patent. 4. Occluded distal P3 branch on the left.  * MRI scan images were reviewed online. I agree with the written report.   2D echo 09/10/18:  IMPRESSIONS    1. The left ventricle has normal systolic function with an ejection fraction of 60-65%. The cavity size was normal. There is severe asymmetric left ventricular hypertrophy with mild concentric LV hypertrophy.  2. The right ventricle has normal systolic function. The cavity was normal. There is no increase in right ventricular wall thickness. Right ventricular systolic pressure is moderately elevated.   3. Left atrial size was mildly dilated.  4. The aortic valve is tricuspid. Moderate thickening of the aortic valve. Severe calcifcation of the aortic valve. Aortic valve regurgitation is moderate by color flow Doppler. Severe stenosis of the aortic valve. Moderate aortic annular calcification  noted. AV Vmax: 379.20 cm/s. AV Mean Grad: 16mmHg . AV Area (VTI): 0.42 cm. LVOT/AV VTI ratio: 0.23.  5. The aorta is normal unless otherwise noted.  6. The inferior vena cava was dilated in size with >50% respiratory  variability.     Assessment/Plan:  1.  Recent right posterior cerebral artery distribution stroke, dense left homonymous visual field deficit  2.  Gait instability  3.  Aortic valve stenosis, severe  The patient has had a cardiac monitor placed today.  She may require surgery for the aortic valve stenosis in the near future.  The patient will stay on aspirin and Plavix combination for 3 weeks and then convert to Plavix alone.  She will follow-up here in 4 months.  Physical therapy will be set up for the gait disturbance.  [name] MD 09/18/2018 11:10 AM  [facility] [address] [place], [place] [address]  Phone [phone] Fax [phone]

## 2018-09-28 ENCOUNTER — Other Ambulatory Visit: Payer: Self-pay

## 2018-09-28 ENCOUNTER — Encounter: Payer: Self-pay | Admitting: Cardiology

## 2018-09-28 ENCOUNTER — Ambulatory Visit (INDEPENDENT_AMBULATORY_CARE_PROVIDER_SITE_OTHER): Admitting: Cardiology

## 2018-09-28 VITALS — BP 135/47 | HR 61 | Temp 97.7°F | Ht 62.0 in | Wt 155.0 lb

## 2018-09-28 DIAGNOSIS — N184 Chronic kidney disease, stage 4 (severe): Secondary | ICD-10-CM | POA: Diagnosis not present

## 2018-09-28 DIAGNOSIS — I2510 Atherosclerotic heart disease of native coronary artery without angina pectoris: Secondary | ICD-10-CM | POA: Diagnosis not present

## 2018-09-28 DIAGNOSIS — I63431 Cerebral infarction due to embolism of right posterior cerebral artery: Secondary | ICD-10-CM

## 2018-09-28 DIAGNOSIS — I080 Rheumatic disorders of both mitral and aortic valves: Secondary | ICD-10-CM | POA: Diagnosis not present

## 2018-09-28 DIAGNOSIS — I129 Hypertensive chronic kidney disease with stage 1 through stage 4 chronic kidney disease, or unspecified chronic kidney disease: Secondary | ICD-10-CM

## 2018-09-28 NOTE — Progress Notes (Signed)
Primary Physician:  Merrilee Seashore, MD   Patient ID: [name], female    DOB: 1940/02/05, 78 y.o.   MRN: [id number]  Subjective:    Chief Complaint  Patient presents with   Hypertension   Aortic Stenosis   Follow-up    2 weeks    HPI: [name]  is a 78 y.o. female  with history of hypertension, hyperlipidemia, diabetes with stage III to IV chronic kidney disease (now follows Dr. Hollie Salk), severe aortic stenosis, CAD, and history of CVA in 2015 and again recently admitted on 09/09/18 with right posterior cerebral artery distribution stroke.She also has asymptomatic marked S. Bradycardia. She now presents for hospital follow up.  She had coronary angiogram on 01/07/2014 with implantation of a 2.5 x 14 mm resolute integrity DES to the distal RCA. Mild disease noted in the Cx and LAD.   She had recently been seen for symptoms of angina and noted to have progression of aortic stenosis to severe; however, at her last in office follow up, symptoms had essentially resolved and as she had not yet started dialysis, we had decided to watch for now. She will need surgery at some point in the near future; however, given her severity of aortic stenosis, there is concern with starting dialysis. She will need coronary angiogram; however, timing of this and also her kidney function have been an issue.   She does have residual dense left homonymous visual field deficit and gait instability as a result from her recent stroke. Recently evaluated by Dr. Jannifer Franklin. She is doing well since being home. No symptoms of chest pain or shortness of breath. No leg swelling or dizziness. Starting physical therapy next week.   States that her blood pressure has been well controlled. She is now followed by Dr. Hollie Salk for CKD stage 4, but has not yet required dialysis.   Past Medical History:  Diagnosis Date   Anginal pain ([place])    Arthritis    Cerebrovascular disease    CKD (chronic kidney disease)      Sees Dr Deterding   Coronary artery disease    Depression    Diabetes ([place])    INSULIN DEPENDENT   Diabetic peripheral neuropathy ([place])    Diabetic retinopathy ([place])    Diverticulitis    Dyslipidemia    GERD (gastroesophageal reflux disease)    Headache(784.0) 01/30/2013   Heart murmur    History of cerebrovascular accident 01/30/2013   Hyperlipidemia    Hypertension    Migraine    Obesity    Stroke ([place])    x 2 no residulal   TIA (transient ischemic attack)    " MULTIPLE PER PATIENT "; had left sided weakness- rehab and wants without assistance    Past Surgical History:  Procedure Laterality Date   ABDOMINAL HYSTERECTOMY     AV FISTULA PLACEMENT Left 12/18/2017   Procedure: ARTERIOVENOUS (AV) FISTULA CREATION ARM;  Surgeon: Marty Heck, MD;  Location: [facility];  Service: Vascular;  Laterality: Left;   bilateral cataract surgery     CARDIAC CATHETERIZATION  01/07/2014   DR Einar Gip   COLON RESECTION  02/1999   COLONOSCOPY     COLOSTOMY  02/1999   COLOSTOMY CLOSURE  07/1999   EYE SURGERY Left 2019   FISTULA SUPERFICIALIZATION Left 06/29/2018   Procedure: FISTULA SUPERFICIALIZATION LEFT BRACHIOCEPHALIC;  Surgeon: Marty Heck, MD;  Location: [facility];  Service: Vascular;  Laterality: Left;   LEFT HEART CATHETERIZATION WITH CORONARY ANGIOGRAM  N/A 01/07/2014   Procedure: LEFT HEART CATHETERIZATION WITH CORONARY ANGIOGRAM;  Surgeon: Laverda Page, MD;  Location: [facility] CATH LAB;  Service: Cardiovascular;  Laterality: N/A;   middle cerebral artery stent placement Right    OTHER SURGICAL HISTORY     laser surgery   PTCA  01/07/2014   DES to RCA    DR Einar Gip   right knee surgery Right    for infection    Social History   Socioeconomic History   Marital status: Widowed    Spouse name: Not on file   Number of children: 2   Years of education: PHD   Highest education level: Not on file  Occupational History   Not on file   Social Needs   Financial resource strain: Not on file   Food insecurity    Worry: Not on file    Inability: Not on file   Transportation needs    Medical: Not on file    Non-medical: Not on file  Tobacco Use   Smoking status: Never Smoker   Smokeless tobacco: Never Used  Substance and Sexual Activity   Alcohol use: No   Drug use: No   Sexual activity: Not on file  Lifestyle   Physical activity    Days per week: Not on file    Minutes per session: Not on file   Stress: Not on file  Relationships   Social connections    Talks on phone: Not on file    Gets together: Not on file    Attends religious service: Not on file    Active member of club or organization: Not on file    Attends meetings of clubs or organizations: Not on file    Relationship status: Not on file   Intimate partner violence    Fear of current or ex partner: Not on file    Emotionally abused: Not on file    Physically abused: Not on file    Forced sexual activity: Not on file  Other Topics Concern   Not on file  Social History Narrative   Patient is widowed and lives with her two adopted children.   Patient is retired.   Patient has two children of her own and two adopted children.   Patient has a PHD.   Patient is right-handed.    Review of Systems  Constitution: Negative for decreased appetite, malaise/fatigue, weight gain and weight loss.  Eyes: Negative for visual disturbance.  Cardiovascular: Negative for chest pain, claudication, dyspnea on exertion, leg swelling, orthopnea, palpitations and syncope.  Respiratory: Negative for hemoptysis and wheezing.   Endocrine: Negative for cold intolerance and heat intolerance.  Hematologic/Lymphatic: Does not bruise/bleed easily.  Skin: Negative for nail changes.  Musculoskeletal: Negative for muscle weakness and myalgias.  Gastrointestinal: Negative for abdominal pain, change in bowel habit, nausea and vomiting.  Neurological: Negative for  difficulty with concentration, dizziness, focal weakness and headaches.  Psychiatric/Behavioral: Negative for altered mental status and suicidal ideas.  All other systems reviewed and are negative.     Objective:  Blood pressure (!) 135/47, pulse 61, temperature 97.7 F (36.5 C), height 5\' 2"  (1.575 m), weight 155 lb (70.3 kg), SpO2 99 %. Body mass index is 28.35 kg/m.    Physical Exam  Constitutional: She is oriented to person, place, and time. Vital signs are normal. She appears well-developed and well-nourished.  Mildly obese  HENT:  Head: Normocephalic and atraumatic.  Neck: Normal range of motion.  Cardiovascular: Normal rate,  regular rhythm and intact distal pulses.  Murmur heard.  Midsystolic murmur is present with a grade of 3/6 at the upper right sternal border radiating to the neck. Pulses:      Carotid pulses are on the right side with bruit and on the left side with bruit.      Femoral pulses are 2+ on the right side and 2+ on the left side.      Popliteal pulses are 1+ on the right side and 1+ on the left side.       Dorsalis pedis pulses are 2+ on the right side and 2+ on the left side.       Posterior tibial pulses are 2+ on the right side and 2+ on the left side.  Left upper arm AV fistula present  Pulmonary/Chest: Effort normal and breath sounds normal. No accessory muscle usage. No respiratory distress.  Abdominal: Soft. Bowel sounds are normal.  Musculoskeletal: Normal range of motion.  Neurological: She is alert and oriented to person, place, and time.  Skin: Skin is warm and dry.  Vitals reviewed.  Radiology:  MRA of head 09/10/2018:  1. Acute infarct right occipital lobe. No associated hemorrhage in the infarct. Chronic microhemorrhage right parietal white matter appears separate. 2. Right M1 stent obscures evaluation of the right middle cerebral artery on MRA 3. Right posterior cerebral artery patent. 4. Occluded distal P3 branch on the  left.  Laboratory examination:    CMP Latest Ref Rng & Units 09/12/2018 09/11/2018 09/10/2018  Glucose 70 - 99 mg/dL 127(H) 107(H) 183(H)  BUN 8 - 23 mg/dL 42(H) 41(H) 38(H)  Creatinine 0.44 - 1.00 mg/dL 3.37(H) 3.68(H) 3.61(H)  Sodium 135 - 145 mmol/L 135 137 137  Potassium 3.5 - 5.1 mmol/L 4.1 4.3 4.7  Chloride 98 - 111 mmol/L 99 100 99  CO2 22 - 32 mmol/L 24 27 22   Calcium 8.9 - 10.3 mg/dL 9.2 9.1 9.4  Total Protein 6.5 - 8.1 g/dL - - -  Total Bilirubin 0.3 - 1.2 mg/dL - - -  Alkaline Phos 38 - 126 U/L - - -  AST 15 - 41 U/L - - -  ALT 0 - 44 U/L - - -   CBC Latest Ref Rng & Units 09/12/2018 09/11/2018 09/10/2018  WBC 4.0 - 10.5 K/uL 9.6 9.9 11.6(H)  Hemoglobin 12.0 - 15.0 g/dL 10.7(L) 10.5(L) 11.4(L)  Hematocrit 36.0 - 46.0 % 32.8(L) 32.9(L) 36.2  Platelets 150 - 400 K/uL 197 196 195   Lipid Panel     Component Value Date/Time   CHOL 147 09/10/2018 0507   TRIG 64 09/10/2018 0507   HDL 76 09/10/2018 0507   CHOLHDL 1.9 09/10/2018 0507   VLDL 13 09/10/2018 0507   LDLCALC 58 09/10/2018 0507   HEMOGLOBIN A1C Lab Results  Component Value Date   HGBA1C 7.6 (H) 09/10/2018   MPG 171.42 09/10/2018   TSH No results for input(s): TSH in the last 8760 hours.  PRN Meds:. Medications Discontinued During This Encounter  Medication Reason   Magnesium 250 MG TABS Error   calcium carbonate (OSCAL) 1500 (600 Ca) MG TABS tablet Error   Current Meds  Medication Sig   acetaminophen (TYLENOL) 325 MG tablet Take 650 mg by mouth every 6 (six) hours as needed (pain/headaches.).   allopurinol (ZYLOPRIM) 100 MG tablet Take 50 mg by mouth daily.   amLODipine (NORVASC) 10 MG tablet Take 10 mg by mouth daily.   aspirin EC 81 MG tablet Take 81 mg  by mouth daily.   B-D INS SYR ULTRAFINE 1CC/30G 30G X 1/2" 1 ML MISC Inject 1 each into the skin 3 (three) times daily between meals.   calcitRIOL (ROCALTROL) 0.25 MCG capsule Take 0.5 mcg by mouth daily.   clopidogrel (PLAVIX) 75 MG tablet  Take 1 tablet (75 mg total) by mouth daily.   Ferrous Sulfate (IRON) 142 (45 Fe) MG TBCR Take 142 mg by mouth 2 (two) times daily.   folic acid (FOLVITE) 1 MG tablet Take 1 mg by mouth daily.   HUMALOG KWIKPEN 100 UNIT/ML KwikPen 4-6 Units by Subconjunctival route 3 (three) times daily before meals. Sliding Scale Insulin   insulin glargine (LANTUS) 100 UNIT/ML injection Inject 12 Units into the skin at bedtime.    leflunomide (ARAVA) 10 MG tablet Take 10 mg by mouth daily.   niacin (NIASPAN) 500 MG CR tablet Take 500 mg by mouth daily.    nitroGLYCERIN (NITROSTAT) 0.4 MG SL tablet PLACE 1 TABLET (0.4 MG TOTAL) UNDER THE TONGUE EVERY 5 MINUTES X 3 DOSES AS NEEDED FOR CHEST PAIN.   nortriptyline (PAMELOR) 10 MG capsule Take 10 mg by mouth daily.   omeprazole (PRILOSEC) 40 MG capsule Take 40 mg by mouth daily.   ONE TOUCH ULTRA TEST test strip 1 each by Other route 3 (three) times daily between meals.   oxyCODONE-acetaminophen (PERCOCET) 5-325 MG tablet Take 1 tablet by mouth every 6 (six) hours as needed for severe pain.   polyvinyl alcohol (TEARS AGAIN) 1.4 % ophthalmic solution Place 1 drop into both eyes 3 (three) times daily as needed for dry eyes.   PRISTIQ 100 MG 24 hr tablet Take 100 mg by mouth daily.    rosuvastatin (CRESTOR) 20 MG tablet Take 20 mg by mouth daily.   torsemide (DEMADEX) 20 MG tablet Take 1 tablet (20 mg total) by mouth daily. May take additional dose in the afternoon for swelling, shortness of breath or weight gain.   [DISCONTINUED] calcium carbonate (OSCAL) 1500 (600 Ca) MG TABS tablet Take 600 mg by mouth daily.    Cardiac Studies:   Echo 08/23/2018:  1. Normal LV systolic function with EF 55%. Left ventricle cavity is normal in size. Mild concentric hypertrophy of the left ventricle. Normal global wall motion. 2. Left atrial cavity is mildly dilated. 3. Trileaflet aortic valve. Moderate aortic annular and leaflet calcification. Severe aortic valve  stenosis. Aortic valve mean gradient of 40 mmHg, Vmax of 4.0 m/s. Calculated aortic valve area by continuity equation is 0.7 cm. Moderate (Grade III) aortic regurgitation. 4. Moderate (Grade II) mitral regurgitation. 5. Moderate tricuspid regurgitation. Moderate pulmonary hypertension. Estimated pulmonary artery systolic pressure is 50 mmHg. 6. Compared to previous study in 2017, there is interval increase in severity of aortic stenosis, mitral and tricuspid regurgitation, pulmonary hypertension. Aortic regurgitation is new.  [place] Stress Test 08/06/2018: Stress EKG is non-diagnostic, as this is pharmacological stress test. Myocardial perfusion imaging is normal. Left ventricular ejection fraction is  50% with normal wall motion. Low risk study.  Coronary angio 01/07/2014: Stenting distal RCA with implantation of a 2.5 x 14 mm resolute integrity DES. Mild disease in Cx and LAD.  Carotid artery duplex 12/04/2013: No evidence of hemodynamically significant stenosis in the bilateral carotid bifurcation vessels. There is evidence of minimal heterogeneous plaque in the bilateral carotid artery.   Assessment:     ICD-10-CM   1. Aortic stenosis with mitral and aortic insufficiency  I08.0   2. Chronic kidney disease (CKD), stage  IV (severe) ([place])  N18.4   3. Coronary artery disease involving native coronary artery of native heart without angina pectoris  I25.10   4. Cerebrovascular accident (CVA) due to embolism of right posterior cerebral artery ([place])  I63.431     EKG 07/16/2018: Sinus bradycardia at 56 bpm, normal axis, poor R wave progression, cannot exclude anterior infarct old. T wave inversion/flattening in inferolateral leads, cannot exclude ischemia. Compared to EKG in Jan 2019, T wave abnormality in lateral leads not as prominent.   Recommendations:   Patient presents for hospital follow up from recent posterior CVA and also for further discussion of management of aortic  stenosis. She is doing well without any complaints. She is currently wearing event monitor for evaluation of possible A fib as etiology of her CVA; however, given systemic atherosclerosis, likely to be culprit. Can consider loop recorder if event monitor is unyielding. She will continue follow up with Neurology as well.   In regard to aortic stenosis, she is asymptomatic; however, she is also mostly sedentary at this time due to her recent CVA. She will likely need TAVR in the near future. Timing of her procedures will be tricky given her valvular disorder and also her kidney disease and will require collaboration from multiple specialties, which was discussed with the patient. Will plan for right and left heart cath in the upcoming weeks for evaluation of her CAD and also aortic stenosis. Will plan on this in the first part of October given her recent CVA. Will plan to perform procedure with limited contrast exposure and will plan for later in the day procedure to have time for patient to have adequate IV hydration prior to the procedure to help with CKD, but will also need to be careful to not fluid overload in view of her aortic stenosis. Appreciate input from Nephrology.   No changes were made to medications today. Blood pressure is well controlled. She is on appropriate medical therapy. She is aware to contact me for any changes in symptoms. I will see her back after the procedure for follow up.   *I have discussed this case with Dr. Virgina Jock and he personally examined the patient and participated in formulating the plan.*   Miquel Dunn, MSN, APRN, FNP-C [facility]. [place] Office: [phone] Fax: [phone]

## 2018-10-01 ENCOUNTER — Ambulatory Visit: Payer: Medicare Other | Attending: Neurology | Admitting: Physical Therapy

## 2018-10-01 ENCOUNTER — Encounter: Payer: Self-pay | Admitting: Physical Therapy

## 2018-10-01 ENCOUNTER — Telehealth: Payer: Self-pay | Admitting: Physical Therapy

## 2018-10-01 ENCOUNTER — Other Ambulatory Visit: Payer: Self-pay

## 2018-10-01 DIAGNOSIS — I69354 Hemiplegia and hemiparesis following cerebral infarction affecting left non-dominant side: Secondary | ICD-10-CM

## 2018-10-01 DIAGNOSIS — R2689 Other abnormalities of gait and mobility: Secondary | ICD-10-CM | POA: Insufficient documentation

## 2018-10-01 NOTE — Telephone Encounter (Signed)
I will send in a referral for occupational therapy.

## 2018-10-01 NOTE — Therapy (Signed)
[facility] [address] [place], [place], [address] Phone: [phone]   Fax:  [phone]  Physical Therapy Evaluation  Patient Details  Name: [name] MRN: [id number] Date of Birth: [birth year] Referring Provider (PT): Kathrynn Ducking, MD   Encounter Date: 10/01/2018  PT End of Session - 10/01/18 0901    Visit Number  1    Number of Visits  12    Date for PT Re-Evaluation  11/12/18    Authorization Type  [employer] MCR progress note every 10, KX 15    PT Start Time  0805    PT Stop Time  0850    PT Time Calculation (min)  45 min    Activity Tolerance  Patient tolerated treatment well    Behavior During Therapy  [facility] for tasks assessed/performed       Past Medical History:  Diagnosis Date  . Anginal pain ([place])   . Arthritis   . Cerebrovascular disease   . CKD (chronic kidney disease)    Sees Dr Deterding  . Coronary artery disease   . Depression   . Diabetes ([place])    INSULIN DEPENDENT  . Diabetic peripheral neuropathy ([place])   . Diabetic retinopathy ([place])   . Diverticulitis   . Dyslipidemia   . GERD (gastroesophageal reflux disease)   . Headache(784.0) 01/30/2013  . Heart murmur   . History of cerebrovascular accident 01/30/2013  . Hyperlipidemia   . Hypertension   . Migraine   . Obesity   . Stroke ([place])    x 2 no residulal  . TIA (transient ischemic attack)    " MULTIPLE PER PATIENT "; had left sided weakness- rehab and wants without assistance    Past Surgical History:  Procedure Laterality Date  . ABDOMINAL HYSTERECTOMY    . AV FISTULA PLACEMENT Left 12/18/2017   Procedure: ARTERIOVENOUS (AV) FISTULA CREATION ARM;  Surgeon: Marty Heck, MD;  Location: [facility];  Service: Vascular;  Laterality: Left;  . bilateral cataract surgery    . CARDIAC CATHETERIZATION  01/07/2014   DR [place] RESECTION  02/1999  . COLONOSCOPY    . COLOSTOMY  02/1999  . COLOSTOMY CLOSURE  07/1999  . EYE  SURGERY Left 2019  . FISTULA SUPERFICIALIZATION Left 06/29/2018   Procedure: FISTULA SUPERFICIALIZATION LEFT BRACHIOCEPHALIC;  Surgeon: Marty Heck, MD;  Location: [facility];  Service: Vascular;  Laterality: Left;  . LEFT HEART CATHETERIZATION WITH CORONARY ANGIOGRAM N/A 01/07/2014   Procedure: LEFT HEART CATHETERIZATION WITH CORONARY ANGIOGRAM;  Surgeon: Laverda Page, MD;  Location: [facility] CATH LAB;  Service: Cardiovascular;  Laterality: N/A;  . middle cerebral artery stent placement Right   . OTHER SURGICAL HISTORY     laser surgery  . PTCA  01/07/2014   DES to RCA    DR Einar Gip  . right knee surgery Right    for infection    There were no vitals filed for this visit.   Subjective Assessment - 10/01/18 0809    Subjective  CVA(Rt PCA) 4 weeks ago with Lt sided weakness, mild gait disturbance, left HH visual field deficit. Had PT at the hospital and finished up 09/13/18. She denies pain. She says she is independent with ADL's except has not been able to walk grocery store distances yet. She does not use AD for gait but relays she has to hold onto things inside her house and is only a house ambulator currently.    Pertinent History  [place] 09/09/18, prev MCA CVA 2015, CAD, CKD,DM,heart murmur    Limitations  Standing;Walking;House hold activities    How long can you walk comfortably?  has not been able to get to grocery store yet.    Patient Stated Goals  get back to normal with strength, balance, working out, fix my vision         [facility] PT Assessment - 10/01/18 0001      Assessment   Medical Diagnosis  CVA(Rt PCA) 4 weeks ago with Lt sided weakness, mild gait disturbance, left HH visual field deficit.    Referring Provider (PT)  Kathrynn Ducking, MD    Onset Date/Surgical Date  --   CVA was 09/09/18   Hand Dominance  Right    Prior Therapy  in hospital      Precautions   Precautions  None      Balance Screen   Has the patient fallen in the past 6 months  No    Has the  patient had a decrease in activity level because of a fear of falling?   No    Is the patient reluctant to leave their home because of a fear of falling?   No      Home Environment   Living Environment  Private residence    Additional Comments  13 stairs and she has to use handrail      Prior Function   Level of Independence  Independent    Leisure  volunteers at church      Observation/Other Assessments   Observations  significant Lt visual field cut      Sensation   Light Touch  Impaired by gross assessment   more sensation on Rt side than Lt   Additional Comments  --      Coordination   Gross Motor Movements are Fluid and Coordinated  --   mildly decreased on Lt vs Rt     ROM / Strength   AROM / PROM / Strength  AROM;Strength      Strength   Overall Strength Comments  Lt arm grossly 4+/5, Rt arm 5/5, Lt grip 4/5, Rt leg overall 5/5, Lt hip overall 4+, Lt knee 5/5, Lt ankle 4+/5 all grossly tested in sitting      Transfers   Transfers  Independent with all Transfers      Ambulation/Gait   Gait Comments  able to ambulate without AD for 100 ft but slow speed, unsteady and wider BOS, does not negotiate obstacles well on her Lt due to visual field cut. Gait speed for 20 ft is 9 sec so 2.2 ft/sec      Standardized Balance Assessment   Standardized Balance Assessment  Berg Balance Test;Timed Up and Go Test;Dynamic Gait Index      Berg Balance Test   Sit to Stand  Able to stand without using hands and stabilize independently    Standing Unsupported  Able to stand 2 minutes with supervision    Sitting with Back Unsupported but Feet Supported on Floor or Stool  Able to sit safely and securely 2 minutes    Stand to Sit  Sits safely with minimal use of hands    Transfers  Able to transfer safely, minor use of hands    Standing Unsupported with Eyes Closed  Able to stand 10 seconds with supervision    Standing Unsupported with Feet Together  Able to place feet together  independently and stand for 1 minute with supervision  From Standing, Reach Forward with Outstretched Arm  Can reach forward >12 cm safely (5")    From Standing Position, Pick up Object from [place] to pick up shoe, needs supervision    From Standing Position, Turn to Look Behind Over each Shoulder  Looks behind from both sides and weight shifts well    Turn 360 Degrees  Needs close supervision or verbal cueing    Standing Unsupported, Alternately Place Feet on Step/Stool  Able to stand independently and safely and complete 8 steps in 20 seconds    Standing Unsupported, One Foot in [place] help to step but can hold 15 seconds    Standing on One Leg  Tries to lift leg/unable to hold 3 seconds but remains standing independently    Total Score  42      Functional Gait  Assessment   Gait assessed   Yes    Gait Level Surface  Walks 20 ft, slow speed, abnormal gait pattern, evidence for imbalance or deviates 10-15 in outside of the 12 in walkway width. Requires more than 7 sec to ambulate 20 ft.    Change in Gait Speed  Able to smoothly change walking speed without loss of balance or gait deviation. Deviate no more than 6 in outside of the 12 in walkway width.    Gait with Horizontal Head Turns  Performs head turns with moderate changes in gait velocity, slows down, deviates 10-15 in outside 12 in walkway width but recovers, can continue to walk.    Gait with Vertical Head Turns  Performs task with slight change in gait velocity (eg, minor disruption to smooth gait path), deviates 6 - 10 in outside 12 in walkway width or uses assistive device    Gait and Pivot Turn  Pivot turns safely in greater than 3 sec and stops with no loss of balance, or pivot turns safely within 3 sec and stops with mild imbalance, requires small steps to catch balance.    Step Over Obstacle  Is able to step over one shoe box (4.5 in total height) but must slow down and adjust steps to clear box safely. May require  verbal cueing.    Gait with Narrow Base of Support  Ambulates less than 4 steps heel to toe or cannot perform without assistance.    Gait with Eyes Closed  Walks 20 ft, slow speed, abnormal gait pattern, evidence for imbalance, deviates 10-15 in outside 12 in walkway width. Requires more than 9 sec to ambulate 20 ft.    Ambulating Backwards  Walks 20 ft, slow speed, abnormal gait pattern, evidence for imbalance, deviates 10-15 in outside 12 in walkway width.    Steps  Alternating feet, must use rail.    Total Score  14                Objective measurements completed on examination: See above findings.      [facility] Adult PT Treatment/Exercise - 10/01/18 0001      Transfers   Comments  needs 3 attempts to perform sit to stand without hands             PT Education - 10/01/18 0908    Education Details  HEP, POC, exam findings, need for OT    Person(s) Educated  Patient;Other (comment)   her brother   Methods  Explanation;Demonstration;Verbal cues;Handout    Comprehension  Verbalized understanding;Need further instruction          PT Long  Term Goals - 10/01/18 0910      PT LONG TERM GOAL #1   Title  Pt will be I and compliant with HEP. (Target for all goals 6 weeks 11/12/18)    Status  New      PT LONG TERM GOAL #2   Title  Pt will improve BERG balance score to at least 50 to show improved balance    Baseline  42    Status  New      PT LONG TERM GOAL #3   Title  Pt will improve FGA to at least 19 to show improved balance.    Baseline  14    Status  New      PT LONG TERM GOAL #4   Title  Pt will improve Lt sided strength to grossly 5-/5 MMT tested in sitting to improve function.    Status  New      PT LONG TERM GOAL #5   Title  Pt will be able to ambulate community distances 1000 ft on even and uneven surfaces and negotiate her 13 stairs while being able to carry something up/down the stairs.    Status  New             Plan - 10/01/18 0902     Clinical Impression Statement  Pt presents with CVA(Rt PCA) 4 weeks ago with Lt sided weakness, mild to moderate gait disturbance and imbalance, mild decreased coordination on her Lt side, and significant left HH visual field deficit. She appears motivated and prognosis is good at this time. She will benefit from skilled PT to address her deficits.    Personal Factors and Comorbidities  Comorbidity 1;Comorbidity 2;Comorbidity 3+    Comorbidities  [id number] MCA CVA 2015, CAD, CKD,DM,heart murmur    Examination-Activity Limitations  Carry;Squat;Stairs;Stand;Lift    Examination-Participation Restrictions  Church;Meal Prep;Cleaning;Community Activity;Driving;Laundry;Volunteer;Shop    Stability/Clinical Decision Making  Evolving/Moderate complexity    Clinical Decision Making  Moderate    Rehab Potential  Excellent    PT Frequency  2x / week    PT Duration  6 weeks    PT Treatment/Interventions  Aquatic Therapy;Electrical Stimulation;Gait training;Stair training;Functional mobility training;Therapeutic activities;Therapeutic exercise;Balance training;Manual techniques;Neuromuscular re-education    PT Next Visit Plan  review HEP, consider testing TUG, 5TSTS, 6MWT, needs balance, gait, Lt sided strength, Lt visual training    PT Home Exercise Plan  Access Code: [email or web address]    Recommended Other Services  OT and PT will ask for referral    Consulted and Agree with Plan of Care  Patient       Patient will benefit from skilled therapeutic intervention in order to improve the following deficits and impairments:  Abnormal gait, Decreased activity tolerance, Decreased balance, Decreased coordination, Decreased endurance, Decreased strength, Difficulty walking  Visit Diagnosis: Hemiplegia and hemiparesis following cerebral infarction affecting left non-dominant side (HCC)  Other abnormalities of gait and mobility     Problem List Patient Active Problem List   Diagnosis Date Noted  . Stroke ([place])  09/09/2018  . Chronic kidney disease (CKD), stage IV (severe) ([place]) 12/05/2017  . Post PTCA 01/07/2014  . CAD (coronary artery disease), native coronary artery 01/07/2014  . S/P PTCA (percutaneous transluminal coronary angioplasty) 01/07/2014  . Angina pectoris associated with type 2 diabetes mellitus ([place]) 01/06/2014  . History of cerebrovascular accident 01/30/2013  . Headache(784.0) 01/30/2013    [name] 10/01/2018, 9:17 AM  [facility] [address] [place],  [place], [address] Phone: [phone]   Fax:  [phone]  Name: [name] MRN: [id number] Date of Birth: 1940-02-10

## 2018-10-01 NOTE — Telephone Encounter (Signed)
Dr. Jannifer Franklin,  I evaluated [name] DOB 05/01/1940 for PT and will treat her for gait, balance, and Lt sided weakness. The patient would benefit from OT evaluation due to her Lt UE weakness and Lt visual field cut. If you agree please place an order in [facility] workque in Epic or fax the order to [phone].  Thank you,  Elsie Ra, PT, DPT 10/01/18 9:26 AM

## 2018-10-01 NOTE — Patient Instructions (Signed)
Access Code: [email or web address]  URL: [email or web address]  Date: 10/01/2018  Prepared by: Elsie Ra   Exercises  Backward Walking with Counter Support - 3-5 reps - 1 sets - 2x daily - 6x weekly  Side Stepping with Counter Support - 3-5 reps - 1 sets - 2x daily - 6x weekly  Standing Tandem Balance with Counter Support - 3 reps - 1 sets - 30 hold - 2x daily - 6x weekly  Standing Single Leg Stance with Counter Support - 10 reps - 3 sets - 2x daily - 6x weekly  Sit to Stand with Arm Reach Toward Target - 10 reps - 3 sets - 2x daily - 6x weekly  Step Up - 10 reps - 2 sets - 2x daily - 6x weekly  Seated Shoulder Flexion with Dumbbells - 10 reps - 3 sets - 2x daily - 6x weekly

## 2018-10-11 ENCOUNTER — Other Ambulatory Visit: Payer: Self-pay | Admitting: Cardiology

## 2018-10-11 ENCOUNTER — Ambulatory Visit: Payer: Medicare Other | Admitting: Physical Therapy

## 2018-10-12 ENCOUNTER — Other Ambulatory Visit: Payer: Self-pay

## 2018-10-12 ENCOUNTER — Ambulatory Visit: Payer: Medicare Other | Admitting: Physical Therapy

## 2018-10-12 DIAGNOSIS — I69354 Hemiplegia and hemiparesis following cerebral infarction affecting left non-dominant side: Secondary | ICD-10-CM | POA: Diagnosis not present

## 2018-10-12 DIAGNOSIS — R2689 Other abnormalities of gait and mobility: Secondary | ICD-10-CM

## 2018-10-12 NOTE — Therapy (Addendum)
[facility] [address] [place], [place], [address] Phone: [phone]   Fax:  [phone]  Physical Therapy Treatment  Patient Details  Name: [name] MRN: [id number] Date of Birth: 1940/03/04 Referring Provider (PT): Kathrynn Ducking, MD   Encounter Date: 10/12/2018  PT End of Session - 10/12/18 1621    Visit Number  2    Number of Visits  12    Date for PT Re-Evaluation  11/12/18    Authorization Type  [employer] MCR progress note every 10, KX 15    PT Start Time  1533    PT Stop Time  1612    PT Time Calculation (min)  39 min    Activity Tolerance  Patient tolerated treatment well    Behavior During Therapy  [facility] for tasks assessed/performed       Past Medical History:  Diagnosis Date  . Anginal pain ([place])   . Arthritis   . Cerebrovascular disease   . CKD (chronic kidney disease)    Sees Dr Deterding  . Coronary artery disease   . Depression   . Diabetes ([place])    INSULIN DEPENDENT  . Diabetic peripheral neuropathy ([place])   . Diabetic retinopathy ([place])   . Diverticulitis   . Dyslipidemia   . GERD (gastroesophageal reflux disease)   . Headache(784.0) 01/30/2013  . Heart murmur   . History of cerebrovascular accident 01/30/2013  . Hyperlipidemia   . Hypertension   . Migraine   . Obesity   . Stroke ([place])    x 2 no residulal  . TIA (transient ischemic attack)    " MULTIPLE PER PATIENT "; had left sided weakness- rehab and wants without assistance    Past Surgical History:  Procedure Laterality Date  . ABDOMINAL HYSTERECTOMY    . AV FISTULA PLACEMENT Left 12/18/2017   Procedure: ARTERIOVENOUS (AV) FISTULA CREATION ARM;  Surgeon: Marty Heck, MD;  Location: [facility];  Service: Vascular;  Laterality: Left;  . bilateral cataract surgery    . CARDIAC CATHETERIZATION  01/07/2014   DR [place] RESECTION  02/1999  . COLONOSCOPY    . COLOSTOMY  02/1999  . COLOSTOMY CLOSURE  07/1999  . EYE  SURGERY Left 2019  . FISTULA SUPERFICIALIZATION Left 06/29/2018   Procedure: FISTULA SUPERFICIALIZATION LEFT BRACHIOCEPHALIC;  Surgeon: Marty Heck, MD;  Location: [facility];  Service: Vascular;  Laterality: Left;  . LEFT HEART CATHETERIZATION WITH CORONARY ANGIOGRAM N/A 01/07/2014   Procedure: LEFT HEART CATHETERIZATION WITH CORONARY ANGIOGRAM;  Surgeon: Laverda Page, MD;  Location: [facility] CATH LAB;  Service: Cardiovascular;  Laterality: N/A;  . middle cerebral artery stent placement Right   . OTHER SURGICAL HISTORY     laser surgery  . PTCA  01/07/2014   DES to RCA    DR Einar Gip  . right knee surgery Right    for infection    There were no vitals filed for this visit.  Subjective Assessment - 10/12/18 1617    Subjective  Denies any falls.  Does report increased edema and saw kidney MD yesterday who increased her diuretic.  Pt reports going for cardiac cath 10/6 and they may do a valve replacement at the same time.    Pertinent History  PMH:CVA 09/09/18, prev MCA CVA 2015, CAD, CKD,DM,heart murmur    Limitations  Standing;Walking;House hold activities    How long can you walk comfortably?  has not been able to get to grocery store  yet.    Patient Stated Goals  get back to normal with strength, balance, working out, fix my vision    Currently in Pain?  No/denies         [facility] PT Assessment - 10/12/18 0001      6 Minute Walk- Baseline   6 Minute Walk- Baseline  yes      6 minute walk test results    Aerobic Endurance Distance Walked  996    Endurance additional comments  no standing rest breaks needed;Denies any dyspnea          [facility] Adult PT Treatment/Exercise - 10/12/18 0001      Transfers   Transfers  Sit to Stand;Stand to Sit    Sit to Stand  5: Supervision    Sit to Stand Details  Verbal cues for technique   cues for forward weight shift   Five time sit to stand comments   19.50 seconds    Stand to Sit  5: Supervision    Number of Reps  10 reps    Comments   cues to reach forward with UE's to increase forward momentum      Ambulation/Gait   Ambulation/Gait  Yes    Ambulation/Gait Assistance  5: Supervision    Ambulation Distance (Feet)  996 Feet   during 6MWT and various bouts around gym for activities   Assistive device  None    Gait Pattern  Decreased arm swing - right;Decreased arm swing - left;Decreased trunk rotation    Ambulation Surface  Level;Indoor      Standardized Balance Assessment   Standardized Balance Assessment  Timed Up and Go Test      Timed Up and Go Test   TUG  Normal TUG    Normal TUG (seconds)  13.19      Exercises   Exercises  Knee/Hip      Knee/Hip Exercises: Aerobic   Other Aerobic  Scifit level 1.5 all 4 extremities x 5 minutes       Exercises   Backward Walking with Counter Support 8' x 2 reps  Side Stepping with Counter Support 8' x 2 reps  Standing Tandem Balance with Counter Support 2 reps each direction   Standing Single Leg Stance with Counter Support 2 reps each side x 10 sec  Sit to Stand with Arm Reach Toward Target - 10 reps    Step Ups to 6" step x 10 reps LLE Seated Shoulder Flexion with Dumbbells - 10 reps - 2# weight     PT Education - 10/12/18 1609    Education Details  Reviewed HEP    Person(s) Educated  Patient    Methods  Explanation;Demonstration    Comprehension  Verbalized understanding;Returned demonstration          PT Long Term Goals - 10/01/18 0910      PT LONG TERM GOAL #1   Title  Pt will be I and compliant with HEP. (Target for all goals 6 weeks 11/12/18)    Status  New      PT LONG TERM GOAL #2   Title  Pt will improve BERG balance score to at least 50 to show improved balance    Baseline  42    Status  New      PT LONG TERM GOAL #3   Title  Pt will improve FGA to at least 19 to show improved balance.    Baseline  14    Status  New  PT LONG TERM GOAL #4   Title  Pt will improve Lt sided strength to grossly 5-/5 MMT tested in sitting to  improve function.    Status  New      PT LONG TERM GOAL #5   Title  Pt will be able to ambulate community distances 1000 ft on even and uneven surfaces and negotiate her 13 stairs while being able to carry something up/down the stairs.    Status  New            Plan - 10/12/18 1622    Clinical Impression Statement  Skilled session focused on HEP along with gait and endurance.  Pt appears motivated to increase mobility but does report plan to have surgery 10/6 for cardiac cath and possible valve replacement.  Continue PT per POC.    Personal Factors and Comorbidities  Comorbidity 1;Comorbidity 2;Comorbidity 3+    Comorbidities  [id number] MCA CVA 2015, CAD, CKD,DM,heart murmur    Examination-Activity Limitations  Carry;Squat;Stairs;Stand;Lift    Examination-Participation Restrictions  Church;Meal Prep;Cleaning;Community Activity;Driving;Laundry;Volunteer;Shop    Stability/Clinical Decision Making  Evolving/Moderate complexity    Rehab Potential  Excellent    PT Frequency  2x / week    PT Duration  6 weeks    PT Treatment/Interventions  Aquatic Therapy;Electrical Stimulation;Gait training;Stair training;Functional mobility training;Therapeutic activities;Therapeutic exercise;Balance training;Manual techniques;Neuromuscular re-education    PT Next Visit Plan  Needs balance, gait, Lt sided strength, Lt visual training  Schedule additional visits after evaluation by OT (so we can get coordinated times).  May need to place on hold and/or re-eval after 10/6 surgery.    PT Home Exercise Plan  Access Code: [email or web address]    Consulted and Agree with Plan of Care  Patient       Patient will benefit from skilled therapeutic intervention in order to improve the following deficits and impairments:  Abnormal gait, Decreased activity tolerance, Decreased balance, Decreased coordination, Decreased endurance, Decreased strength, Difficulty walking  Visit Diagnosis: Other abnormalities of gait and  mobility  Hemiplegia and hemiparesis following cerebral infarction affecting left non-dominant side [facility])     Problem List Patient Active Problem List   Diagnosis Date Noted  . Stroke ([place]) 09/09/2018  . Chronic kidney disease (CKD), stage IV (severe) ([place]) 12/05/2017  . Post PTCA 01/07/2014  . CAD (coronary artery disease), native coronary artery 01/07/2014  . S/P PTCA (percutaneous transluminal coronary angioplasty) 01/07/2014  . Angina pectoris associated with type 2 diabetes mellitus ([place]) 01/06/2014  . History of cerebrovascular accident 01/30/2013  . Headache(784.0) 01/30/2013    Narda Bonds, PTA [facility] 10/12/18 4:26 PM Phone: [phone] Fax: [facility] [address] [place], [place], [address] Phone: [phone]   Fax:  [phone]  Name: KIMIYE BARMAN MRN: [id number] Date of Birth: [birth year]

## 2018-10-16 ENCOUNTER — Ambulatory Visit: Payer: Medicare Other | Admitting: Physical Therapy

## 2018-10-16 ENCOUNTER — Other Ambulatory Visit: Payer: Self-pay | Admitting: Cardiology

## 2018-10-16 ENCOUNTER — Other Ambulatory Visit: Payer: Self-pay

## 2018-10-16 VITALS — BP 167/48 | HR 68

## 2018-10-16 DIAGNOSIS — I69354 Hemiplegia and hemiparesis following cerebral infarction affecting left non-dominant side: Secondary | ICD-10-CM

## 2018-10-16 DIAGNOSIS — I080 Rheumatic disorders of both mitral and aortic valves: Secondary | ICD-10-CM

## 2018-10-16 DIAGNOSIS — R2689 Other abnormalities of gait and mobility: Secondary | ICD-10-CM

## 2018-10-16 NOTE — Therapy (Signed)
[facility] [address] [place], [place], [address] Phone: [phone]   Fax:  [phone]  Physical Therapy Treatment  Patient Details  Name: [name] MRN: [id number] Date of Birth: 1940-07-05 Referring Provider (PT): Kathrynn Ducking, MD   Encounter Date: 10/16/2018  PT End of Session - 10/16/18 1013    Visit Number  3    Number of Visits  12    Date for PT Re-Evaluation  11/12/18    Authorization Type  [employer] MCR progress note every 10, KX 15    PT Start Time  0936    PT Stop Time  1016    PT Time Calculation (min)  40 min    Activity Tolerance  Patient tolerated treatment well    Behavior During Therapy  [facility] for tasks assessed/performed       Past Medical History:  Diagnosis Date  . Anginal pain ([place])   . Arthritis   . Cerebrovascular disease   . CKD (chronic kidney disease)    Sees Dr Deterding  . Coronary artery disease   . Depression   . Diabetes ([place])    INSULIN DEPENDENT  . Diabetic peripheral neuropathy ([place])   . Diabetic retinopathy ([place])   . Diverticulitis   . Dyslipidemia   . GERD (gastroesophageal reflux disease)   . Headache(784.0) 01/30/2013  . Heart murmur   . History of cerebrovascular accident 01/30/2013  . Hyperlipidemia   . Hypertension   . Migraine   . Obesity   . Stroke ([place])    x 2 no residulal  . TIA (transient ischemic attack)    " MULTIPLE PER PATIENT "; had left sided weakness- rehab and wants without assistance    Past Surgical History:  Procedure Laterality Date  . ABDOMINAL HYSTERECTOMY    . AV FISTULA PLACEMENT Left 12/18/2017   Procedure: ARTERIOVENOUS (AV) FISTULA CREATION ARM;  Surgeon: Marty Heck, MD;  Location: [facility];  Service: Vascular;  Laterality: Left;  . bilateral cataract surgery    . CARDIAC CATHETERIZATION  01/07/2014   DR [place] RESECTION  02/1999  . COLONOSCOPY    . COLOSTOMY  02/1999  . COLOSTOMY CLOSURE  07/1999  . EYE  SURGERY Left 2019  . FISTULA SUPERFICIALIZATION Left 06/29/2018   Procedure: FISTULA SUPERFICIALIZATION LEFT BRACHIOCEPHALIC;  Surgeon: Marty Heck, MD;  Location: [facility];  Service: Vascular;  Laterality: Left;  . LEFT HEART CATHETERIZATION WITH CORONARY ANGIOGRAM N/A 01/07/2014   Procedure: LEFT HEART CATHETERIZATION WITH CORONARY ANGIOGRAM;  Surgeon: Laverda Page, MD;  Location: [facility] CATH LAB;  Service: Cardiovascular;  Laterality: N/A;  . middle cerebral artery stent placement Right   . OTHER SURGICAL HISTORY     laser surgery  . PTCA  01/07/2014   DES to RCA    DR Einar Gip  . right knee surgery Right    for infection    Vitals:   10/16/18 0956  BP: (!) 167/48  Pulse: 68    Subjective Assessment - 10/16/18 0939    Subjective  Denies any falls or changes.  No pain.  "I'm moving slow today.  I don't know why."  Pt going for her covid presurgical test Friday to prepare for surgery next week.    Pertinent History  PMH:CVA 09/09/18, prev MCA CVA 2015, CAD, CKD,DM,heart murmur    Limitations  Standing;Walking;House hold activities    How long can you walk comfortably?  has not been able to get  to grocery store yet.    Patient Stated Goals  get back to normal with strength, balance, working out, fix my vision    Currently in Pain?  No/denies          [facility] Adult PT Treatment/Exercise - 10/16/18 0001      Exercises   Exercises  Knee/Hip;Ankle      Knee/Hip Exercises: Aerobic   Other Aerobic  Scifit level 1.5 all 4 extremities x 6 minutes      Knee/Hip Exercises: Standing   Heel Raises  Both;2 sets;10 reps;2 seconds    Knee Flexion  Both;2 sets;10 reps    Hip Flexion  Both;2 sets;10 reps;Knee straight    Hip Abduction  Both;2 sets;10 reps;Knee straight    Hip Extension  Both;2 sets;10 reps    Other Standing Knee Exercises  Marching bil LE x 10 reps x 2 ssets      Knee/Hip Exercises: Supine   Bridges  Both;2 sets;10 reps    Other Supine Knee/Hip Exercises  Clam shell  bil legs x 10 x 2 sets then single leg x 10 x 2 sets each side-all performed with red theraband             PT Education - 10/16/18 1029    Education Details  Scheduling additional PT visits but will need resume order from MD following surgery    Person(s) Educated  Patient    Methods  Explanation    Comprehension  Verbalized understanding          PT Long Term Goals - 10/01/18 0910      PT LONG TERM GOAL #1   Title  Pt will be I and compliant with HEP. (Target for all goals 6 weeks 11/12/18)    Status  New      PT LONG TERM GOAL #2   Title  Pt will improve BERG balance score to at least 50 to show improved balance    Baseline  42    Status  New      PT LONG TERM GOAL #3   Title  Pt will improve FGA to at least 19 to show improved balance.    Baseline  14    Status  New      PT LONG TERM GOAL #4   Title  Pt will improve Lt sided strength to grossly 5-/5 MMT tested in sitting to improve function.    Status  New      PT LONG TERM GOAL #5   Title  Pt will be able to ambulate community distances 1000 ft on even and uneven surfaces and negotiate her 13 stairs while being able to carry something up/down the stairs.    Status  New            Plan - 10/16/18 1029    Clinical Impression Statement  Skilled session focused on strength and endurance today.  Pt needs frequent rest breaks (seated and standing) during treatment.  Continue PT per POC.    Personal Factors and Comorbidities  Comorbidity 1;Comorbidity 2;Comorbidity 3+    Comorbidities  [id number] MCA CVA 2015, CAD, CKD,DM,heart murmur    Examination-Activity Limitations  Carry;Squat;Stairs;Stand;Lift    Examination-Participation Restrictions  Church;Meal Prep;Cleaning;Community Activity;Driving;Laundry;Volunteer;Shop    Stability/Clinical Decision Making  Evolving/Moderate complexity    Rehab Potential  Excellent    PT Frequency  2x / week    PT Duration  6 weeks    PT Treatment/Interventions  Aquatic  Therapy;Electrical Stimulation;Gait training;Stair training;Functional  mobility training;Therapeutic activities;Therapeutic exercise;Balance training;Manual techniques;Neuromuscular re-education    PT Next Visit Plan  Needs balance, gait, Lt sided strength, Lt visual training  May need to place on hold and/or re-eval after 10/6 surgery.    PT Home Exercise Plan  Access Code: [email or web address]    Consulted and Agree with Plan of Care  Patient       Patient will benefit from skilled therapeutic intervention in order to improve the following deficits and impairments:  Abnormal gait, Decreased activity tolerance, Decreased balance, Decreased coordination, Decreased endurance, Decreased strength, Difficulty walking  Visit Diagnosis: Hemiplegia and hemiparesis following cerebral infarction affecting left non-dominant side (HCC)  Other abnormalities of gait and mobility     Problem List Patient Active Problem List   Diagnosis Date Noted  . Stroke ([place]) 09/09/2018  . Chronic kidney disease (CKD), stage IV (severe) ([place]) 12/05/2017  . Post PTCA 01/07/2014  . CAD (coronary artery disease), native coronary artery 01/07/2014  . S/P PTCA (percutaneous transluminal coronary angioplasty) 01/07/2014  . Angina pectoris associated with type 2 diabetes mellitus ([place]) 01/06/2014  . History of cerebrovascular accident 01/30/2013  . Headache(784.0) 01/30/2013   Narda Bonds, PTA [place] 10/16/18 10:31 AM Phone: [phone] Fax: [facility] [facility] [address] [place], [place], [address] Phone: [phone]   Fax:  [phone]  Name: NAUTIA LERNER MRN: [id number] Date of Birth: [birth year]

## 2018-10-17 LAB — BASIC METABOLIC PANEL
BUN/Creatinine Ratio: 13 (ref 12–28)
BUN: 40 mg/dL — ABNORMAL HIGH (ref 8–27)
CO2: 26 mmol/L (ref 20–29)
Calcium: 10 mg/dL (ref 8.7–10.3)
Chloride: 101 mmol/L (ref 96–106)
Creatinine, Ser: 3 mg/dL — ABNORMAL HIGH (ref 0.57–1.00)
GFR calc Af Amer: 16 mL/min/{1.73_m2} — ABNORMAL LOW (ref >59)
GFR calc non Af Amer: 14 mL/min/{1.73_m2} — ABNORMAL LOW (ref >59)
Glucose: 159 mg/dL — ABNORMAL HIGH (ref 65–99)
Potassium: 5.5 mmol/L — ABNORMAL HIGH (ref 3.5–5.2)
Sodium: 142 mmol/L (ref 134–144)

## 2018-10-17 LAB — CBC
Hematocrit: 33 % — ABNORMAL LOW (ref 34.0–46.6)
Hemoglobin: 10.8 g/dL — ABNORMAL LOW (ref 11.1–15.9)
MCH: 30.3 pg (ref 26.6–33.0)
MCHC: 32.7 g/dL (ref 31.5–35.7)
MCV: 93 fL (ref 79–97)
Platelets: 191 10*3/uL (ref 150–450)
RBC: 3.56 x10E6/uL — ABNORMAL LOW (ref 3.77–5.28)
RDW: 13.3 % (ref 11.7–15.4)
WBC: 7 10*3/uL (ref 3.4–10.8)

## 2018-10-18 ENCOUNTER — Ambulatory Visit: Payer: Medicare Other | Admitting: Occupational Therapy

## 2018-10-18 ENCOUNTER — Ambulatory Visit: Payer: Medicare Other | Attending: Neurology | Admitting: Physical Therapy

## 2018-10-18 ENCOUNTER — Other Ambulatory Visit: Payer: Self-pay

## 2018-10-18 VITALS — BP 146/61 | HR 70

## 2018-10-18 DIAGNOSIS — I69354 Hemiplegia and hemiparesis following cerebral infarction affecting left non-dominant side: Secondary | ICD-10-CM

## 2018-10-18 DIAGNOSIS — R2689 Other abnormalities of gait and mobility: Secondary | ICD-10-CM | POA: Insufficient documentation

## 2018-10-18 DIAGNOSIS — I639 Cerebral infarction, unspecified: Secondary | ICD-10-CM | POA: Diagnosis present

## 2018-10-18 DIAGNOSIS — M6281 Muscle weakness (generalized): Secondary | ICD-10-CM | POA: Diagnosis present

## 2018-10-18 DIAGNOSIS — R278 Other lack of coordination: Secondary | ICD-10-CM | POA: Insufficient documentation

## 2018-10-18 DIAGNOSIS — H5340 Unspecified visual field defects: Secondary | ICD-10-CM | POA: Diagnosis present

## 2018-10-18 DIAGNOSIS — R449 Unspecified symptoms and signs involving general sensations and perceptions: Secondary | ICD-10-CM | POA: Insufficient documentation

## 2018-10-18 NOTE — Therapy (Signed)
[facility] [address] [place], [place], [address] Phone: [phone]   Fax:  [phone]  Physical Therapy Treatment  Patient Details  Name: [name] MRN: [id number] Date of Birth: [birth year] Referring Provider (PT): Kathrynn Ducking, MD   Encounter Date: 10/18/2018  PT End of Session - 10/18/18 1251    Visit Number  4    Number of Visits  12    Date for PT Re-Evaluation  11/12/18    Authorization Type  [employer] MCR progress note every 10, KX 15    PT Start Time  0938    PT Stop Time  1016    PT Time Calculation (min)  38 min    Activity Tolerance  Patient tolerated treatment well    Behavior During Therapy  [facility] for tasks assessed/performed       Past Medical History:  Diagnosis Date  . Anginal pain ([place])   . Arthritis   . Cerebrovascular disease   . CKD (chronic kidney disease)    Sees Dr Deterding  . Coronary artery disease   . Depression   . Diabetes ([place])    INSULIN DEPENDENT  . Diabetic peripheral neuropathy ([place])   . Diabetic retinopathy ([place])   . Diverticulitis   . Dyslipidemia   . GERD (gastroesophageal reflux disease)   . Headache(784.0) 01/30/2013  . Heart murmur   . History of cerebrovascular accident 01/30/2013  . Hyperlipidemia   . Hypertension   . Migraine   . Obesity   . Stroke ([place])    x 2 no residulal  . TIA (transient ischemic attack)    " MULTIPLE PER PATIENT "; had left sided weakness- rehab and wants without assistance    Past Surgical History:  Procedure Laterality Date  . ABDOMINAL HYSTERECTOMY    . AV FISTULA PLACEMENT Left 12/18/2017   Procedure: ARTERIOVENOUS (AV) FISTULA CREATION ARM;  Surgeon: Marty Heck, MD;  Location: [facility];  Service: Vascular;  Laterality: Left;  . bilateral cataract surgery    . CARDIAC CATHETERIZATION  01/07/2014   DR [place] RESECTION  02/1999  . COLONOSCOPY    . COLOSTOMY  02/1999  . COLOSTOMY CLOSURE  07/1999  . EYE  SURGERY Left 2019  . FISTULA SUPERFICIALIZATION Left 06/29/2018   Procedure: FISTULA SUPERFICIALIZATION LEFT BRACHIOCEPHALIC;  Surgeon: Marty Heck, MD;  Location: [facility];  Service: Vascular;  Laterality: Left;  . LEFT HEART CATHETERIZATION WITH CORONARY ANGIOGRAM N/A 01/07/2014   Procedure: LEFT HEART CATHETERIZATION WITH CORONARY ANGIOGRAM;  Surgeon: Laverda Page, MD;  Location: [facility] CATH LAB;  Service: Cardiovascular;  Laterality: N/A;  . middle cerebral artery stent placement Right   . OTHER SURGICAL HISTORY     laser surgery  . PTCA  01/07/2014   DES to RCA    DR Einar Gip  . right knee surgery Right    for infection    Vitals:   10/18/18 0951 10/18/18 1009  BP: (!) 180/56 (!) 146/61  Pulse: 78 70    Subjective Assessment - 10/18/18 0945    Subjective  Denies any falls or changes.  Felt ok after last session.    Pertinent History  PMH:CVA 09/09/18, prev MCA CVA 2015, CAD, CKD,DM,heart murmur    Limitations  Standing;Walking;House hold activities    How long can you walk comfortably?  has not been able to get to grocery store yet.    Patient Stated Goals  get back to normal with strength,  balance, working out, fix my vision    Currently in Pain?  No/denies         [facility] Adult PT Treatment/Exercise - 10/18/18 0001      Exercises   Exercises  Knee/Hip      Knee/Hip Exercises: Aerobic   Other Aerobic  Scifit level 1.5 all 4 extremities x 6 minutes      Knee/Hip Exercises: Seated   Long Arc Quad  Both;2 sets;10 reps;Other (comment)   red theraband   Clamshell with TheraBand  Red   10 reps 2 sets   Knee/Hip Flexion  10 reps x 2 sets both sides red theraband    Hamstring Curl  Both;2 sets;10 reps;Other (comment)   red theraband   Abduction/Adduction   Both;2 sets;10 reps    Sit to Sand  2 sets;10 reps;without UE support             PT Education - 10/18/18 1250    Education Details  Needing resume order for PT/OT after procedure/surgery    Person(s)  Educated  Patient    Methods  Explanation    Comprehension  Verbalized understanding          PT Long Term Goals - 10/01/18 0910      PT LONG TERM GOAL #1   Title  Pt will be I and compliant with HEP. (Target for all goals 6 weeks 11/12/18)    Status  New      PT LONG TERM GOAL #2   Title  Pt will improve BERG balance score to at least 50 to show improved balance    Baseline  42    Status  New      PT LONG TERM GOAL #3   Title  Pt will improve FGA to at least 19 to show improved balance.    Baseline  14    Status  New      PT LONG TERM GOAL #4   Title  Pt will improve Lt sided strength to grossly 5-/5 MMT tested in sitting to improve function.    Status  New      PT LONG TERM GOAL #5   Title  Pt will be able to ambulate community distances 1000 ft on even and uneven surfaces and negotiate her 13 stairs while being able to carry something up/down the stairs.    Status  New            Plan - 10/18/18 1251    Clinical Impression Statement  Skilled session focused on strength and endurance.  Pt's endurance, strength and balance have improved since initial evaluation.  Pt to have cardiac cath with possible valve replacment so will need resume order after procedure (pt aware she needs order).  Pt scheduled to return in approx. 3 weeks and will need reevaluation.  Discussed with Guido Sander, PT, who agrees and will re-eval.    Personal Factors and Comorbidities  Comorbidity 1;Comorbidity 2;Comorbidity 3+    Comorbidities  [id number] MCA CVA 2015, CAD, CKD,DM,heart murmur    Examination-Activity Limitations  Carry;Squat;Stairs;Stand;Lift    Examination-Participation Restrictions  Church;Meal Prep;Cleaning;Community Activity;Driving;Laundry;Volunteer;Shop    Stability/Clinical Decision Making  Evolving/Moderate complexity    Rehab Potential  Excellent    PT Frequency  2x / week    PT Duration  6 weeks    PT Treatment/Interventions  Aquatic Therapy;Electrical Stimulation;Gait  training;Stair training;Functional mobility training;Therapeutic activities;Therapeutic exercise;Balance training;Manual techniques;Neuromuscular re-education    PT Next Visit Plan  Re-evaluation by PT after  surgical procedure/surgery.    PT Home Exercise Plan  Access Code: [email or web address]    Consulted and Agree with Plan of Care  Patient       Patient will benefit from skilled therapeutic intervention in order to improve the following deficits and impairments:  Abnormal gait, Decreased activity tolerance, Decreased balance, Decreased coordination, Decreased endurance, Decreased strength, Difficulty walking  Visit Diagnosis: Hemiplegia and hemiparesis following cerebral infarction affecting left non-dominant side (HCC)  Other abnormalities of gait and mobility     Problem List Patient Active Problem List   Diagnosis Date Noted  . Stroke ([place]) 09/09/2018  . Chronic kidney disease (CKD), stage IV (severe) ([place]) 12/05/2017  . Post PTCA 01/07/2014  . CAD (coronary artery disease), native coronary artery 01/07/2014  . S/P PTCA (percutaneous transluminal coronary angioplasty) 01/07/2014  . Angina pectoris associated with type 2 diabetes mellitus ([place]) 01/06/2014  . History of cerebrovascular accident 01/30/2013  . Headache(784.0) 01/30/2013    Narda Bonds, PTA [place] 10/18/18 12:56 PM Phone: [phone] Fax: [facility] [place] [address] [place], [place], [address] Phone: [phone]   Fax:  [phone]  Name: [name] MRN: [id number] Date of Birth: 09-03-1940

## 2018-10-19 ENCOUNTER — Other Ambulatory Visit (HOSPITAL_COMMUNITY)
Admission: RE | Admit: 2018-10-19 | Discharge: 2018-10-19 | Disposition: A | Discharge status: Home / Self Care | Payer: Medicare Other | Source: Ambulatory Visit | Attending: Cardiology | Admitting: Cardiology

## 2018-10-19 DIAGNOSIS — I350 Nonrheumatic aortic (valve) stenosis: Secondary | ICD-10-CM | POA: Insufficient documentation

## 2018-10-19 DIAGNOSIS — Z01812 Encounter for preprocedural laboratory examination: Secondary | ICD-10-CM | POA: Diagnosis present

## 2018-10-19 DIAGNOSIS — Z20828 Contact with and (suspected) exposure to other viral communicable diseases: Secondary | ICD-10-CM | POA: Insufficient documentation

## 2018-10-21 LAB — NOVEL CORONAVIRUS, NAA (HOSP ORDER, SEND-OUT TO REF LAB; TAT 18-24 HRS): SARS-CoV-2, NAA: NOT DETECTED

## 2018-10-23 ENCOUNTER — Other Ambulatory Visit: Payer: Self-pay | Admitting: Cardiology

## 2018-10-23 ENCOUNTER — Other Ambulatory Visit: Payer: Self-pay

## 2018-10-23 ENCOUNTER — Ambulatory Visit (HOSPITAL_COMMUNITY)
Admission: RE | Admit: 2018-10-23 | Discharge: 2018-10-23 | Disposition: A | Discharge status: Home / Self Care | Payer: Medicare Other | Attending: Cardiology | Admitting: Cardiology

## 2018-10-23 ENCOUNTER — Ambulatory Visit (HOSPITAL_COMMUNITY)
Admission: RE | Disposition: A | Discharge status: Home / Self Care | Payer: Self-pay | Source: Home / Self Care | Attending: Cardiology

## 2018-10-23 DIAGNOSIS — K219 Gastro-esophageal reflux disease without esophagitis: Secondary | ICD-10-CM | POA: Insufficient documentation

## 2018-10-23 DIAGNOSIS — E1142 Type 2 diabetes mellitus with diabetic polyneuropathy: Secondary | ICD-10-CM | POA: Insufficient documentation

## 2018-10-23 DIAGNOSIS — Z7982 Long term (current) use of aspirin: Secondary | ICD-10-CM | POA: Diagnosis not present

## 2018-10-23 DIAGNOSIS — Z7902 Long term (current) use of antithrombotics/antiplatelets: Secondary | ICD-10-CM | POA: Insufficient documentation

## 2018-10-23 DIAGNOSIS — E669 Obesity, unspecified: Secondary | ICD-10-CM | POA: Insufficient documentation

## 2018-10-23 DIAGNOSIS — E1122 Type 2 diabetes mellitus with diabetic chronic kidney disease: Secondary | ICD-10-CM | POA: Diagnosis not present

## 2018-10-23 DIAGNOSIS — E785 Hyperlipidemia, unspecified: Secondary | ICD-10-CM | POA: Insufficient documentation

## 2018-10-23 DIAGNOSIS — I350 Nonrheumatic aortic (valve) stenosis: Secondary | ICD-10-CM | POA: Diagnosis present

## 2018-10-23 DIAGNOSIS — N184 Chronic kidney disease, stage 4 (severe): Secondary | ICD-10-CM | POA: Diagnosis not present

## 2018-10-23 DIAGNOSIS — I2510 Atherosclerotic heart disease of native coronary artery without angina pectoris: Secondary | ICD-10-CM | POA: Insufficient documentation

## 2018-10-23 DIAGNOSIS — Z794 Long term (current) use of insulin: Secondary | ICD-10-CM | POA: Insufficient documentation

## 2018-10-23 DIAGNOSIS — Z6828 Body mass index (BMI) 28.0-28.9, adult: Secondary | ICD-10-CM | POA: Insufficient documentation

## 2018-10-23 DIAGNOSIS — I10 Essential (primary) hypertension: Secondary | ICD-10-CM

## 2018-10-23 DIAGNOSIS — I129 Hypertensive chronic kidney disease with stage 1 through stage 4 chronic kidney disease, or unspecified chronic kidney disease: Secondary | ICD-10-CM | POA: Insufficient documentation

## 2018-10-23 DIAGNOSIS — Z79899 Other long term (current) drug therapy: Secondary | ICD-10-CM | POA: Diagnosis not present

## 2018-10-23 DIAGNOSIS — E11319 Type 2 diabetes mellitus with unspecified diabetic retinopathy without macular edema: Secondary | ICD-10-CM | POA: Diagnosis not present

## 2018-10-23 DIAGNOSIS — M1990 Unspecified osteoarthritis, unspecified site: Secondary | ICD-10-CM | POA: Insufficient documentation

## 2018-10-23 DIAGNOSIS — Z90710 Acquired absence of both cervix and uterus: Secondary | ICD-10-CM | POA: Insufficient documentation

## 2018-10-23 DIAGNOSIS — Z8673 Personal history of transient ischemic attack (TIA), and cerebral infarction without residual deficits: Secondary | ICD-10-CM | POA: Diagnosis not present

## 2018-10-23 HISTORY — PX: RIGHT/LEFT HEART CATH AND CORONARY ANGIOGRAPHY: CATH118266

## 2018-10-23 LAB — POCT I-STAT 7, (LYTES, BLD GAS, ICA,H+H)
Acid-base deficit: 3 mmol/L — ABNORMAL HIGH (ref 0.0–2.0)
Bicarbonate: 20.3 mmol/L (ref 20.0–28.0)
Calcium, Ion: 1.16 mmol/L (ref 1.15–1.40)
HCT: 30 % — ABNORMAL LOW (ref 36.0–46.0)
Hemoglobin: 10.2 g/dL — ABNORMAL LOW (ref 12.0–15.0)
O2 Saturation: 96 %
Potassium: 3.9 mmol/L (ref 3.5–5.1)
Sodium: 139 mmol/L (ref 135–145)
TCO2: 21 mmol/L — ABNORMAL LOW (ref 22–32)
pCO2 arterial: 28 mmHg — ABNORMAL LOW (ref 32.0–48.0)
pH, Arterial: 7.467 — ABNORMAL HIGH (ref 7.350–7.450)
pO2, Arterial: 76 mmHg — ABNORMAL LOW (ref 83.0–108.0)

## 2018-10-23 LAB — GLUCOSE, CAPILLARY
Glucose-Capillary: 151 mg/dL — ABNORMAL HIGH (ref 70–99)
Glucose-Capillary: 148 mg/dL — ABNORMAL HIGH (ref 70–99)

## 2018-10-23 SURGERY — RIGHT/LEFT HEART CATH AND CORONARY ANGIOGRAPHY
Anesthesia: LOCAL

## 2018-10-23 MED ORDER — ASPIRIN 81 MG PO CHEW: 81.0000 mg | CHEWABLE_TABLET | ORAL | Status: DC

## 2018-10-23 MED ORDER — FENTANYL CITRATE (PF) 100 MCG/2ML IJ SOLN
INTRAMUSCULAR | Status: DC | PRN
  Administered 2018-10-23: 25 ug via INTRAVENOUS

## 2018-10-23 MED ORDER — SODIUM CHLORIDE 0.9 % WEIGHT BASED INFUSION
3.0000 mL/kg/h | INTRAVENOUS | Status: DC
  Administered 2018-10-23: 3 mL/kg/h via INTRAVENOUS

## 2018-10-23 MED ORDER — NITROGLYCERIN 1 MG/10 ML FOR IR/CATH LAB
INTRA_ARTERIAL | Status: AC
  Filled 2018-10-23: qty 10

## 2018-10-23 MED ORDER — MIDAZOLAM HCL 2 MG/2ML IJ SOLN
INTRAMUSCULAR | Status: AC
  Filled 2018-10-23: qty 2

## 2018-10-23 MED ORDER — SODIUM CHLORIDE 0.9 % IV SOLN: INTRAVENOUS | Status: AC

## 2018-10-23 MED ORDER — HEPARIN SODIUM (PORCINE) 1000 UNIT/ML IJ SOLN
INTRAMUSCULAR | Status: AC
  Filled 2018-10-23: qty 1

## 2018-10-23 MED ORDER — HYDRALAZINE HCL 20 MG/ML IJ SOLN: 10.0000 mg | INTRAMUSCULAR | Status: DC | PRN

## 2018-10-23 MED ORDER — SODIUM CHLORIDE 0.9% FLUSH: 3.0000 mL | Freq: Two times a day (BID) | INTRAVENOUS | Status: DC

## 2018-10-23 MED ORDER — HEPARIN (PORCINE) IN NACL 1000-0.9 UT/500ML-% IV SOLN
INTRAVENOUS | Status: DC | PRN
  Administered 2018-10-23: 500 mL

## 2018-10-23 MED ORDER — MIDAZOLAM HCL 2 MG/2ML IJ SOLN
INTRAMUSCULAR | Status: DC | PRN
  Administered 2018-10-23: 1 mg via INTRAVENOUS

## 2018-10-23 MED ORDER — FENTANYL CITRATE (PF) 100 MCG/2ML IJ SOLN
INTRAMUSCULAR | Status: AC
  Filled 2018-10-23: qty 2

## 2018-10-23 MED ORDER — LABETALOL HCL 5 MG/ML IV SOLN
INTRAVENOUS | Status: AC
  Filled 2018-10-23: qty 4

## 2018-10-23 MED ORDER — IOHEXOL 350 MG/ML SOLN
INTRAVENOUS | Status: DC | PRN
  Administered 2018-10-23: 50 mL via INTRA_ARTERIAL

## 2018-10-23 MED ORDER — VERAPAMIL HCL 2.5 MG/ML IV SOLN
INTRAVENOUS | Status: AC
  Filled 2018-10-23: qty 2

## 2018-10-23 MED ORDER — ACETAMINOPHEN 325 MG PO TABS: 650.0000 mg | ORAL_TABLET | ORAL | Status: DC | PRN

## 2018-10-23 MED ORDER — LIDOCAINE HCL (PF) 1 % IJ SOLN
INTRAMUSCULAR | Status: DC | PRN
  Administered 2018-10-23 (×2): 2 mL

## 2018-10-23 MED ORDER — SODIUM CHLORIDE 0.9% FLUSH: 3.0000 mL | INTRAVENOUS | Status: DC | PRN

## 2018-10-23 MED ORDER — SODIUM CHLORIDE 0.9 % IV SOLN: 250.0000 mL | INTRAVENOUS | Status: DC | PRN

## 2018-10-23 MED ORDER — LIDOCAINE HCL (PF) 1 % IJ SOLN
INTRAMUSCULAR | Status: AC
  Filled 2018-10-23: qty 30

## 2018-10-23 MED ORDER — LABETALOL HCL 5 MG/ML IV SOLN: 10.0000 mg | INTRAVENOUS | Status: DC | PRN

## 2018-10-23 MED ORDER — SODIUM CHLORIDE 0.9 % WEIGHT BASED INFUSION: 1.5000 mL/kg/h | INTRAVENOUS | Status: DC

## 2018-10-23 MED ORDER — ONDANSETRON HCL 4 MG/2ML IJ SOLN: 4.0000 mg | Freq: Four times a day (QID) | INTRAMUSCULAR | Status: DC | PRN

## 2018-10-23 MED ORDER — LABETALOL HCL 5 MG/ML IV SOLN
INTRAVENOUS | Status: DC | PRN
  Administered 2018-10-23: 10 mg via INTRAVENOUS

## 2018-10-23 MED ORDER — HEPARIN (PORCINE) IN NACL 1000-0.9 UT/500ML-% IV SOLN
INTRAVENOUS | Status: AC
  Filled 2018-10-23: qty 1000

## 2018-10-23 MED ORDER — NITROGLYCERIN 1 MG/10 ML FOR IR/CATH LAB
INTRA_ARTERIAL | Status: DC | PRN
  Administered 2018-10-23: 200 ug via INTRACORONARY

## 2018-10-23 SURGICAL SUPPLY — 17 items
CATH BALLN WEDGE 5F 110CM (CATHETERS) ×1 IMPLANT
CATH INFINITI 5 FR 3DRC (CATHETERS) ×1 IMPLANT
CATH INFINITI 5 FR AR1 MOD (CATHETERS) ×1 IMPLANT
CATH INFINITI 5FR AL1 (CATHETERS) ×1 IMPLANT
CATH INFINITI 5FR JL4 (CATHETERS) ×1 IMPLANT
CATH INFINITI JR4 5F (CATHETERS) ×1 IMPLANT
CATH LAUNCHER 5F NOTO (CATHETERS) IMPLANT
CATHETER LAUNCHER 5F NOTO (CATHETERS) ×2
GLIDESHEATH SLEND A-KIT 6F 22G (SHEATH) ×1 IMPLANT
KIT HEART LEFT (KITS) ×2 IMPLANT
KIT MICROPUNCTURE NIT STIFF (SHEATH) ×1 IMPLANT
PACK CARDIAC CATHETERIZATION (CUSTOM PROCEDURE TRAY) ×2 IMPLANT
SHEATH GLIDE SLENDER 4/5FR (SHEATH) ×1 IMPLANT
SHEATH PINNACLE 5F 10CM (SHEATH) ×1 IMPLANT
SHEATH PROBE COVER 6X72 (BAG) ×1 IMPLANT
TRANSDUCER W/STOPCOCK (MISCELLANEOUS) ×2 IMPLANT
WIRE EMERALD 3MM-J .035X150CM (WIRE) ×1 IMPLANT

## 2018-10-23 NOTE — Progress Notes (Signed)
Site area- right  Site Prior to Removal- 0   Pressure Applied For-  20 MInutes   Bedrest Beginning at - 1615   Manual- Yes   Patient Status During Pull- Stable    Post Pull Groin Site- 0   Post Pull Instructions Given- Yes   Post Pull Pulses Present- Yes    Dressing Applied- Tegaderm and Gauze Dressing    Comments:  Site CDI. Pt tol well

## 2018-10-23 NOTE — Interval H&P Note (Signed)
History and Physical Interval Note:  10/23/2018 1:55 PM  [name]  has presented today for surgery, with the diagnosis of aortic stenosis.  The various methods of treatment have been discussed with the patient and family. After consideration of risks, benefits and other options for treatment, the patient has consented to  Procedure(s): RIGHT/LEFT HEART CATH AND CORONARY ANGIOGRAPHY (N/A) as a surgical intervention.  The patient's history has been reviewed, patient examined, no change in status, stable for surgery.  I have reviewed the patient's chart and labs.  Questions were answered to the patient's satisfaction.     Sever AS. Pre op RHC/LHC  Manish J Patwardhan

## 2018-10-23 NOTE — Progress Notes (Signed)
Discharge instructions reviewed with pt and her brother. Both voice understanding.  

## 2018-10-23 NOTE — Progress Notes (Signed)
No bleeding or hematoma noted after ambulation 

## 2018-10-23 NOTE — Research (Signed)
PHDE Informed Consent   Subject Name: [name]  Subject met inclusion and exclusion criteria.  The informed consent form, study requirements and expectations were reviewed with the subject and questions and concerns were addressed prior to the signing of the consent form.  The subject verbalized understanding of the trail requirements.  The subject agreed to participate in the PHDE trial and signed the informed consent.  The informed consent was obtained prior to performance of any protocol-specific procedures for the subject.  A copy of the signed informed consent was given to the subject and a copy was placed in the subject's medical record.  Neva Seat 10/23/2018, 10:17 AM

## 2018-10-23 NOTE — Progress Notes (Signed)
Dr Virgina Jock called to check on pt informed him of BP states ok to given prm Hydralazine if needed

## 2018-10-23 NOTE — H&P (Signed)
OV 9/11 copied for documentation purposes.    Primary Physician:  Merrilee Seashore, MD   Patient ID: [name], female    DOB: 03/08/1940, 78 y.o.   MRN: [id number]  Subjective:    No chief complaint on file.   HPI: [name]  is a 78 y.o. female  with history of hypertension, hyperlipidemia, diabetes with stage III to IV chronic kidney disease (now follows Dr. Hollie Salk), severe aortic stenosis, CAD, and history of CVA in 2015 and again recently admitted on 09/09/18 with right posterior cerebral artery distribution stroke.She also has asymptomatic marked S. Bradycardia. She now presents for hospital follow up.  She had coronary angiogram on 01/07/2014 with implantation of a 2.5 x 14 mm resolute integrity DES to the distal RCA. Mild disease noted in the Cx and LAD.   She had recently been seen for symptoms of angina and noted to have progression of aortic stenosis to severe; however, at her last in office follow up, symptoms had essentially resolved and as she had not yet started dialysis, we had decided to watch for now. She will need surgery at some point in the near future; however, given her severity of aortic stenosis, there is concern with starting dialysis. She will need coronary angiogram; however, timing of this and also her kidney function have been an issue.   She does have residual dense left homonymous visual field deficit and gait instability as a result from her recent stroke. Recently evaluated by Dr. Jannifer Franklin. She is doing well since being home. No symptoms of chest pain or shortness of breath. No leg swelling or dizziness. Starting physical therapy next week.   States that her blood pressure has been well controlled. She is now followed by Dr. Hollie Salk for CKD stage 4, but has not yet required dialysis.   Past Medical History:  Diagnosis Date  . Anginal pain ([place])   . Arthritis   . Cerebrovascular disease   . CKD (chronic kidney disease)    Sees Dr Deterding  .  Coronary artery disease   . Depression   . Diabetes ([place])    INSULIN DEPENDENT  . Diabetic peripheral neuropathy ([place])   . Diabetic retinopathy ([place])   . Diverticulitis   . Dyslipidemia   . GERD (gastroesophageal reflux disease)   . Headache(784.0) 01/30/2013  . Heart murmur   . History of cerebrovascular accident 01/30/2013  . Hyperlipidemia   . Hypertension   . Migraine   . Obesity   . Stroke ([place])    x 2 no residulal  . TIA (transient ischemic attack)    " MULTIPLE PER PATIENT "; had left sided weakness- rehab and wants without assistance    Past Surgical History:  Procedure Laterality Date  . ABDOMINAL HYSTERECTOMY    . AV FISTULA PLACEMENT Left 12/18/2017   Procedure: ARTERIOVENOUS (AV) FISTULA CREATION ARM;  Surgeon: Marty Heck, MD;  Location: [facility];  Service: Vascular;  Laterality: Left;  . bilateral cataract surgery    . CARDIAC CATHETERIZATION  01/07/2014   DR Omro RESECTION  02/1999  . COLONOSCOPY    . COLOSTOMY  02/1999  . COLOSTOMY CLOSURE  07/1999  . EYE SURGERY Left 2019  . FISTULA SUPERFICIALIZATION Left 06/29/2018   Procedure: FISTULA SUPERFICIALIZATION LEFT BRACHIOCEPHALIC;  Surgeon: Marty Heck, MD;  Location: [facility];  Service: Vascular;  Laterality: Left;  . LEFT HEART CATHETERIZATION WITH CORONARY ANGIOGRAM N/A 01/07/2014   Procedure: LEFT HEART CATHETERIZATION WITH CORONARY  ANGIOGRAM;  Surgeon: Laverda Page, MD;  Location: [facility] CATH LAB;  Service: Cardiovascular;  Laterality: N/A;  . middle cerebral artery stent placement Right   . OTHER SURGICAL HISTORY     laser surgery  . PTCA  01/07/2014   DES to RCA    DR Einar Gip  . right knee surgery Right    for infection    Social History   Socioeconomic History  . Marital status: Widowed    Spouse name: Not on file  . Number of children: 2  . Years of education: PHD  . Highest education level: Not on file  Occupational History  . Not on file  Social Needs  . Financial  resource strain: Not on file  . Food insecurity    Worry: Not on file    Inability: Not on file  . Transportation needs    Medical: Not on file    Non-medical: Not on file  Tobacco Use  . Smoking status: Never Smoker  . Smokeless tobacco: Never Used  Substance and Sexual Activity  . Alcohol use: No  . Drug use: No  . Sexual activity: Not on file  Lifestyle  . Physical activity    Days per week: Not on file    Minutes per session: Not on file  . Stress: Not on file  Relationships  . Social Herbalist on phone: Not on file    Gets together: Not on file    Attends religious service: Not on file    Active member of club or organization: Not on file    Attends meetings of clubs or organizations: Not on file    Relationship status: Not on file  . Intimate partner violence    Fear of current or ex partner: Not on file    Emotionally abused: Not on file    Physically abused: Not on file    Forced sexual activity: Not on file  Other Topics Concern  . Not on file  Social History Narrative   Patient is widowed and lives with her two adopted children.   Patient is retired.   Patient has two children of her own and two adopted children.   Patient has a PHD.   Patient is right-handed.    Review of Systems  Constitution: Negative for decreased appetite, malaise/fatigue, weight gain and weight loss.  Eyes: Negative for visual disturbance.  Cardiovascular: Negative for chest pain, claudication, dyspnea on exertion, leg swelling, orthopnea, palpitations and syncope.  Respiratory: Negative for hemoptysis and wheezing.   Endocrine: Negative for cold intolerance and heat intolerance.  Hematologic/Lymphatic: Does not bruise/bleed easily.  Skin: Negative for nail changes.  Musculoskeletal: Negative for muscle weakness and myalgias.  Gastrointestinal: Negative for abdominal pain, change in bowel habit, nausea and vomiting.  Neurological: Negative for difficulty with  concentration, dizziness, focal weakness and headaches.  Psychiatric/Behavioral: Negative for altered mental status and suicidal ideas.  All other systems reviewed and are negative.     Objective:  Blood pressure (!) 117/53, pulse 62, temperature 98.2 F (36.8 C), temperature source Skin, resp. rate 16, height 5\' 2"  (1.575 m), weight 70.8 kg, SpO2 100 %. Body mass index is 28.53 kg/m.    Physical Exam  Constitutional: She is oriented to person, place, and time. Vital signs are normal. She appears well-developed and well-nourished.  Mildly obese  HENT:  Head: Normocephalic and atraumatic.  Neck: Normal range of motion.  Cardiovascular: Normal rate, regular rhythm and intact distal pulses.  Murmur heard.  Midsystolic murmur is present with a grade of 3/6 at the upper right sternal border radiating to the neck. Pulses:      Carotid pulses are on the right side with bruit and on the left side with bruit.      Femoral pulses are 2+ on the right side and 2+ on the left side.      Popliteal pulses are 1+ on the right side and 1+ on the left side.       Dorsalis pedis pulses are 2+ on the right side and 2+ on the left side.       Posterior tibial pulses are 2+ on the right side and 2+ on the left side.  Left upper arm AV fistula present  Pulmonary/Chest: Effort normal and breath sounds normal. No accessory muscle usage. No respiratory distress.  Abdominal: Soft. Bowel sounds are normal.  Musculoskeletal: Normal range of motion.  Neurological: She is alert and oriented to person, place, and time.  Skin: Skin is warm and dry.  Vitals reviewed.  Radiology:  MRA of head 09/10/2018:  1. Acute infarct right occipital lobe. No associated hemorrhage in the infarct. Chronic microhemorrhage right parietal white matter appears separate. 2. Right M1 stent obscures evaluation of the right middle cerebral artery on MRA 3. Right posterior cerebral artery patent. 4. Occluded distal P3 branch on the  left.  Laboratory examination:    CMP Latest Ref Rng & Units 10/16/2018 09/12/2018 09/11/2018  Glucose 65 - 99 mg/dL 159(H) 127(H) 107(H)  BUN 8 - 27 mg/dL 40(H) 42(H) 41(H)  Creatinine 0.57 - 1.00 mg/dL 3.00(H) 3.37(H) 3.68(H)  Sodium 134 - 144 mmol/L 142 135 137  Potassium 3.5 - 5.2 mmol/L 5.5(H) 4.1 4.3  Chloride 96 - 106 mmol/L 101 99 100  CO2 20 - 29 mmol/L 26 24 27   Calcium 8.7 - 10.3 mg/dL 10.0 9.2 9.1  Total Protein 6.5 - 8.1 g/dL - - -  Total Bilirubin 0.3 - 1.2 mg/dL - - -  Alkaline Phos 38 - 126 U/L - - -  AST 15 - 41 U/L - - -  ALT 0 - 44 U/L - - -   CBC Latest Ref Rng & Units 10/16/2018 09/12/2018 09/11/2018  WBC 3.4 - 10.8 x10E3/uL 7.0 9.6 9.9  Hemoglobin 11.1 - 15.9 g/dL 10.8(L) 10.7(L) 10.5(L)  Hematocrit 34.0 - 46.6 % 33.0(L) 32.8(L) 32.9(L)  Platelets 150 - 450 x10E3/uL 191 197 196   Lipid Panel     Component Value Date/Time   CHOL 147 09/10/2018 0507   TRIG 64 09/10/2018 0507   HDL 76 09/10/2018 0507   CHOLHDL 1.9 09/10/2018 0507   VLDL 13 09/10/2018 0507   LDLCALC 58 09/10/2018 0507   HEMOGLOBIN A1C Lab Results  Component Value Date   HGBA1C 7.6 (H) 09/10/2018   MPG 171.42 09/10/2018   TSH No results for input(s): TSH in the last 8760 hours.  PRN Meds:. Medications Discontinued During This Encounter  Medication Reason  . omeprazole (PRILOSEC) 40 MG capsule Patient Preference  . 0.9% sodium chloride infusion Completed Course  . 0.9% sodium chloride infusion Completed Course   Current Meds  Medication Sig  . allopurinol (ZYLOPRIM) 100 MG tablet Take 50 mg by mouth daily.  Marland Kitchen amLODipine (NORVASC) 10 MG tablet Take 1 tablet by mouth every day (Patient taking differently: Take by mouth daily. )  . aspirin EC 81 MG tablet Take 81 mg by mouth daily.  . calcitRIOL (ROCALTROL) 0.25 MCG capsule Take  0.25 mcg by mouth daily.   . clopidogrel (PLAVIX) 75 MG tablet Take 1 tablet (75 mg total) by mouth daily.  . Ferrous Sulfate (IRON) 142 (45 Fe) MG TBCR Take  142 mg by mouth 2 (two) times daily.  . folic acid (FOLVITE) 1 MG tablet Take 1 mg by mouth daily.  Marland Kitchen HUMALOG KWIKPEN 100 UNIT/ML KwikPen 4-6 Units by Subconjunctival route 3 (three) times daily before meals. Sliding Scale Insulin  . insulin glargine (LANTUS) 100 UNIT/ML injection Inject 12 Units into the skin at bedtime.   Marland Kitchen labetalol (NORMODYNE) 100 MG tablet Take 100 mg by mouth 2 (two) times daily.  Marland Kitchen leflunomide (ARAVA) 10 MG tablet Take 10 mg by mouth daily.  . niacin (NIASPAN) 500 MG CR tablet Take 500 mg by mouth daily.   . nitroGLYCERIN (NITROSTAT) 0.4 MG SL tablet PLACE 1 TABLET (0.4 MG TOTAL) UNDER THE TONGUE EVERY 5 MINUTES X 3 DOSES AS NEEDED FOR CHEST PAIN. (Patient taking differently: Place 0.4 mg under the tongue every 5 (five) minutes as needed for chest pain. )  . nortriptyline (PAMELOR) 10 MG capsule Take 10 mg by mouth daily.  . polyvinyl alcohol (TEARS AGAIN) 1.4 % ophthalmic solution Place 1 drop into both eyes 3 (three) times daily as needed for dry eyes.  Marland Kitchen PRISTIQ 100 MG 24 hr tablet Take 100 mg by mouth daily.   . rosuvastatin (CRESTOR) 20 MG tablet Take 20 mg by mouth daily.  Marland Kitchen torsemide (DEMADEX) 20 MG tablet Take 1 tablet (20 mg total) by mouth daily. May take additional dose in the afternoon for swelling, shortness of breath or weight gain. (Patient taking differently: Take 40 mg by mouth daily. May take additional dose in the afternoon for swelling, shortness of breath or weight gain.)    Cardiac Studies:   Echo 08/23/2018:  1. Normal LV systolic function with EF 55%. Left ventricle cavity is normal in size. Mild concentric hypertrophy of the left ventricle. Normal global wall motion. 2. Left atrial cavity is mildly dilated. 3. Trileaflet aortic valve. Moderate aortic annular and leaflet calcification. Severe aortic valve stenosis. Aortic valve mean gradient of 40 mmHg, Vmax of 4.0 m/s. Calculated aortic valve area by continuity equation is 0.7 cm. Moderate  (Grade III) aortic regurgitation. 4. Moderate (Grade II) mitral regurgitation. 5. Moderate tricuspid regurgitation. Moderate pulmonary hypertension. Estimated pulmonary artery systolic pressure is 50 mmHg. 6. Compared to previous study in 2017, there is interval increase in severity of aortic stenosis, mitral and tricuspid regurgitation, pulmonary hypertension. Aortic regurgitation is new.  [place] Stress Test 08/06/2018: Stress EKG is non-diagnostic, as this is pharmacological stress test. Myocardial perfusion imaging is normal. Left ventricular ejection fraction is  50% with normal wall motion. Low risk study.  Coronary angio 01/07/2014: Stenting distal RCA with implantation of a 2.5 x 14 mm resolute integrity DES. Mild disease in Cx and LAD.  Carotid artery duplex 12/04/2013: No evidence of hemodynamically significant stenosis in the bilateral carotid bifurcation vessels. There is evidence of minimal heterogeneous plaque in the bilateral carotid artery.   Assessment:   No diagnosis found.  EKG 07/16/2018: Sinus bradycardia at 56 bpm, normal axis, poor R wave progression, cannot exclude anterior infarct old. T wave inversion/flattening in inferolateral leads, cannot exclude ischemia. Compared to EKG in Jan 2019, T wave abnormality in lateral leads not as prominent.   Recommendations:   Patient presents for hospital follow up from recent posterior CVA and also for further discussion of management of aortic  stenosis. She is doing well without any complaints. She is currently wearing event monitor for evaluation of possible A fib as etiology of her CVA; however, given systemic atherosclerosis, likely to be culprit. Can consider loop recorder if event monitor is unyielding. She will continue follow up with Neurology as well.   In regard to aortic stenosis, she is asymptomatic; however, she is also mostly sedentary at this time due to her recent CVA. She will likely need TAVR in the  near future. Timing of her procedures will be tricky given her valvular disorder and also her kidney disease and will require collaboration from multiple specialties, which was discussed with the patient. Will plan for right and left heart cath in the upcoming weeks for evaluation of her CAD and also aortic stenosis. Will plan on this in the first part of October given her recent CVA. Will plan to perform procedure with limited contrast exposure and will plan for later in the day procedure to have time for patient to have adequate IV hydration prior to the procedure to help with CKD, but will also need to be careful to not fluid overload in view of her aortic stenosis. Appreciate input from Nephrology.   No changes were made to medications today. Blood pressure is well controlled. She is on appropriate medical therapy. She is aware to contact me for any changes in symptoms. I will see her back after the procedure for follow up.   *I have discussed this case with Dr. Virgina Jock and he personally examined the patient and participated in formulating the plan.*   Weldon, MSN, APRN, FNP-C [facility] [place] Office: [phone] Fax: [phone]

## 2018-10-23 NOTE — Discharge Instructions (Signed)
Femoral Site Care °This sheet gives you information about how to care for yourself after your procedure. Your health care provider may also give you more specific instructions. If you have problems or questions, contact your health care provider. °What can I expect after the procedure? °After the procedure, it is common to have: °· Bruising that usually fades within 1-2 weeks. °· Tenderness at the site. °Follow these instructions at home: °Wound care °· Follow instructions from your health care provider about how to take care of your insertion site. Make sure you: °? Wash your hands with soap and water before you change your bandage (dressing). If soap and water are not available, use hand sanitizer. °? Change your dressing as told by your health care provider. °? Leave stitches (sutures), skin glue, or adhesive strips in place. These skin closures may need to stay in place for 2 weeks or longer. If adhesive strip edges start to loosen and curl up, you may trim the loose edges. Do not remove adhesive strips completely unless your health care provider tells you to do that. °· Do not take baths, swim, or use a hot tub until your health care provider approves. °· You may shower 24-48 hours after the procedure or as told by your health care provider. °? Gently wash the site with plain soap and water. °? Pat the area dry with a clean towel. °? Do not rub the site. This may cause bleeding. °· Do not apply powder or lotion to the site. Keep the site clean and dry. °· Check your femoral site every day for signs of infection. Check for: °? Redness, swelling, or pain. °? Fluid or blood. °? Warmth. °? Pus or a bad smell. °Activity °· For the first 2-3 days after your procedure, or as long as directed: °? Avoid climbing stairs as much as possible. °? Do not squat. °· Do not lift anything that is heavier than 10 lb (4.5 kg), or the limit that you are told, until your health care provider says that it is safe. °· Rest as  directed. °? Avoid sitting for a long time without moving. Get up to take short walks every 1-2 hours. °· Do not drive for 24 hours if you were given a medicine to help you relax (sedative). °General instructions °· Take over-the-counter and prescription medicines only as told by your health care provider. °· Keep all follow-up visits as told by your health care provider. This is important. °Contact a health care provider if you have: °· A fever or chills. °· You have redness, swelling, or pain around your insertion site. °Get help right away if: °· The catheter insertion area swells very fast. °· You pass out. °· You suddenly start to sweat or your skin gets clammy. °· The catheter insertion area is bleeding, and the bleeding does not stop when you hold steady pressure on the area. °· The area near or just beyond the catheter insertion site becomes pale, cool, tingly, or numb. °These symptoms may represent a serious problem that is an emergency. Do not wait to see if the symptoms will go away. Get medical help right away. Call your local emergency services (911 in the [place] Do not drive yourself to the hospital. °Summary °· After the procedure, it is common to have bruising that usually fades within 1-2 weeks. °· Check your femoral site every day for signs of infection. °· Do not lift anything that is heavier than 10 lb (4.5 kg), or the   limit that you are told, until your health care provider says that it is safe. °This information is not intended to replace advice given to you by your health care provider. Make sure you discuss any questions you have with your health care provider. °Document Released: 09/06/2013 Document Revised: 01/16/2017 Document Reviewed: 01/16/2017 °Elsevier Patient Education © 2020 [employer] ° °

## 2018-10-24 ENCOUNTER — Telehealth: Payer: Self-pay

## 2018-10-24 ENCOUNTER — Other Ambulatory Visit: Payer: Self-pay | Admitting: Cardiology

## 2018-10-24 ENCOUNTER — Encounter (HOSPITAL_COMMUNITY): Payer: Self-pay | Admitting: Cardiology

## 2018-10-24 DIAGNOSIS — I080 Rheumatic disorders of both mitral and aortic valves: Secondary | ICD-10-CM

## 2018-10-24 LAB — POCT I-STAT EG7
Acid-base deficit: 1 mmol/L (ref 0.0–2.0)
Acid-base deficit: 2 mmol/L (ref 0.0–2.0)
Bicarbonate: 21.8 mmol/L (ref 20.0–28.0)
Bicarbonate: 22.4 mmol/L (ref 20.0–28.0)
Calcium, Ion: 1.21 mmol/L (ref 1.15–1.40)
Calcium, Ion: 1.22 mmol/L (ref 1.15–1.40)
HCT: 31 % — ABNORMAL LOW (ref 36.0–46.0)
HCT: 30 % — ABNORMAL LOW (ref 36.0–46.0)
Hemoglobin: 10.5 g/dL — ABNORMAL LOW (ref 12.0–15.0)
Hemoglobin: 10.2 g/dL — ABNORMAL LOW (ref 12.0–15.0)
O2 Saturation: 66 %
O2 Saturation: 64 %
Potassium: 4 mmol/L (ref 3.5–5.1)
Potassium: 4 mmol/L (ref 3.5–5.1)
Sodium: 137 mmol/L (ref 135–145)
Sodium: 138 mmol/L (ref 135–145)
TCO2: 23 mmol/L (ref 22–32)
TCO2: 23 mmol/L (ref 22–32)
pCO2, Ven: 29.7 mmHg — ABNORMAL LOW (ref 44.0–60.0)
pCO2, Ven: 35.6 mmHg — ABNORMAL LOW (ref 44.0–60.0)
pH, Ven: 7.474 — ABNORMAL HIGH (ref 7.250–7.430)
pH, Ven: 7.407 (ref 7.250–7.430)
pO2, Ven: 31 mmHg — CL (ref 32.0–45.0)
pO2, Ven: 33 mmHg (ref 32.0–45.0)

## 2018-10-24 MED FILL — Verapamil HCl IV Soln 2.5 MG/ML: INTRAVENOUS | Qty: 2 | Status: AC

## 2018-10-24 NOTE — Telephone Encounter (Signed)
Would have her contact her PCP to see what they think. Looks like I am seeing her next week

## 2018-10-24 NOTE — Telephone Encounter (Signed)
Pt called to inform us that she had an anxiety attack last night and started to cough, pt mention she has not been able to sleep. Pt stated to she has been having the cough for the past week. Please advise thank you.

## 2018-10-24 NOTE — Telephone Encounter (Signed)
Does she have any other symptoms? May not be heart related

## 2018-10-24 NOTE — Telephone Encounter (Signed)
No just that

## 2018-10-25 ENCOUNTER — Other Ambulatory Visit: Payer: Self-pay | Admitting: Cardiology

## 2018-10-25 NOTE — Progress Notes (Signed)
[facility] Consult Note  Chief Complaint  Patient presents with  . New Patient (Initial Visit)    severe aortic stenosis   History of Present Illness: 78 yo female with history of stage 4 chronic kidney disease, DM, CAD, hyperlipidemia, GERD, prior CVA, HTN, diverticulitis s/p partial colectomy and severe aortic stenosis who is here today as a new consult, referred by Dr. Virgina Jock, for further evaluation of her aortic stenosis and possible TAVR. She is known to have CAD. A drug eluting stent was placed in the RCA in December 2015. Cardiac catheterization 10/23/18 with moderate ostial RCA stenosis, no obstructive disease in the left main artery, LAD or Circumflex. The distal RCA stent is patent. She has had several strokes, most recently in August 2020 with visual loss and gain instability following the stroke. She has stage 4 chronic kidney disease. She has stage 4 chronic kidney disease with creatinine of 3.0-3.6 with GFR less than 20. She has a left upper extremity fistula. She has been followed for aortic stenosis. Most recent echo 09/10/18 with normal LV systolic function, [id number]. There is severe asymmetric left ventricular hypertrophy. The aortic valve leaflets are thickened and calcified. Mean gradient 40 mmHg, peak gradient 63 mHg, AVA 0.42 cm2, dimensionless index 0.23. This is consistent with severe aortic stenosis. There is moderate aortic valve insufficiency. There is mild mitral valve regurgitation.   She tells me today that she has been having progressive dyspnea, even when walking across the room. No lower extremity edema. No dizziness. No chest pain. She goes to the dentist and has no active dental issues. She lives in her own home here in [place]. Her daughter lives with her. She is here today with her brother. 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   Primary Care Physician: Merrilee Seashore, MD Primary Cardiologist: Vernell Leep Referring Cardiologist: Vernell Leep  Past Medical History:  Diagnosis Date  . Anginal pain ([place])   . Arthritis   . Cerebrovascular disease   . CKD (chronic kidney disease)    Sees Dr Deterding  . Coronary artery disease   . Depression   . Diabetes ([place])    INSULIN DEPENDENT  . Diabetic peripheral neuropathy ([place])   . Diabetic retinopathy ([place])   . Diverticulitis   . Dyslipidemia   . GERD (gastroesophageal reflux disease)   . Headache(784.0) 01/30/2013  . Heart murmur   . History of cerebrovascular accident 01/30/2013  . Hyperlipidemia   . Hypertension   . Migraine   . Obesity   . Stroke ([place])    x 2 no residulal  . TIA (transient ischemic attack)    " MULTIPLE PER PATIENT "; had left sided weakness- rehab and wants without assistance    Past Surgical History:  Procedure Laterality Date  . ABDOMINAL HYSTERECTOMY    . AV FISTULA PLACEMENT Left 12/18/2017   Procedure: ARTERIOVENOUS (AV) FISTULA CREATION ARM;  Surgeon: Marty Heck, MD;  Location: [facility];  Service: Vascular;  Laterality: Left;  . bilateral cataract surgery    . CARDIAC CATHETERIZATION  01/07/2014   DR Veyo RESECTION  02/1999  . COLONOSCOPY    . COLOSTOMY  02/1999  . COLOSTOMY CLOSURE  07/1999  . EYE SURGERY Left 2019  . FISTULA SUPERFICIALIZATION Left 06/29/2018   Procedure: FISTULA SUPERFICIALIZATION LEFT BRACHIOCEPHALIC;  Surgeon: Marty Heck, MD;  Location: [facility];  Service: Vascular;  Laterality: Left;  . LEFT HEART CATHETERIZATION WITH CORONARY ANGIOGRAM N/A 01/07/2014  Procedure: LEFT HEART CATHETERIZATION WITH CORONARY ANGIOGRAM;  Surgeon: Laverda Page, MD;  Location: [facility] CATH LAB;  Service: Cardiovascular;  Laterality: N/A;  . middle cerebral artery stent placement Right   . OTHER SURGICAL HISTORY     laser surgery  . PTCA  01/07/2014   DES to RCA    DR Einar Gip  . right knee surgery Right    for infection  . RIGHT/LEFT HEART CATH AND CORONARY ANGIOGRAPHY N/A 10/23/2018   Procedure: RIGHT/LEFT  HEART CATH AND CORONARY ANGIOGRAPHY;  Surgeon: Nigel Mormon, MD;  Location: [facility] CV LAB;  Service: Cardiovascular;  Laterality: N/A;    Current Outpatient Medications  Medication Sig Dispense Refill  . acetaminophen (TYLENOL) 325 MG tablet Take 650 mg by mouth every 6 (six) hours as needed (pain/headaches.).    Marland Kitchen allopurinol (ZYLOPRIM) 100 MG tablet Take 50 mg by mouth daily.    Marland Kitchen amLODipine (NORVASC) 10 MG tablet Take 1 tablet by mouth every day 30 tablet 11  . aspirin EC 81 MG tablet Take 81 mg by mouth daily.    . B-D INS SYR ULTRAFINE 1CC/30G 30G X 1/2" 1 ML MISC Inject 1 each into the skin 3 (three) times daily between meals.    . calcitRIOL (ROCALTROL) 0.25 MCG capsule Take 0.25 mcg by mouth daily.     . clopidogrel (PLAVIX) 75 MG tablet Take 1 tablet (75 mg total) by mouth daily. 90 tablet 1  . folic acid (FOLVITE) 1 MG tablet Take 1 mg by mouth daily.    Marland Kitchen HUMALOG KWIKPEN 100 UNIT/ML KwikPen 4-6 Units by Subconjunctival route 3 (three) times daily before meals. Sliding Scale Insulin    . insulin glargine (LANTUS) 100 UNIT/ML injection Inject 12 Units into the skin at bedtime.     Marland Kitchen labetalol (NORMODYNE) 100 MG tablet Take 100 mg by mouth 2 (two) times daily.    Marland Kitchen leflunomide (ARAVA) 10 MG tablet Take 10 mg by mouth daily.    . niacin (NIASPAN) 500 MG CR tablet Take 500 mg by mouth daily.     . nitroGLYCERIN (NITROSTAT) 0.4 MG SL tablet PLACE 1 TABLET (0.4 MG TOTAL) UNDER THE TONGUE EVERY 5 MINUTES X 3 DOSES AS NEEDED FOR CHEST PAIN. 25 tablet 1  . nortriptyline (PAMELOR) 10 MG capsule Take 10 mg by mouth daily.    . ONE TOUCH ULTRA TEST test strip 1 each by Other route 3 (three) times daily between meals.    Marland Kitchen oxyCODONE-acetaminophen (PERCOCET) 5-325 MG tablet Take 1 tablet by mouth every 6 (six) hours as needed for severe pain. 12 tablet 0  . polyvinyl alcohol (TEARS AGAIN) 1.4 % ophthalmic solution Place 1 drop into both eyes 3 (three) times daily as needed for dry  eyes.    Marland Kitchen PRISTIQ 100 MG 24 hr tablet Take 100 mg by mouth daily.     . rosuvastatin (CRESTOR) 20 MG tablet Take 20 mg by mouth daily.    Marland Kitchen torsemide (DEMADEX) 20 MG tablet Take 1 tablet (20 mg total) by mouth daily. May take additional dose in the afternoon for swelling, shortness of breath or weight gain. (Patient taking differently: Take 40 mg by mouth daily. May take additional dose in the afternoon for swelling, shortness of breath or weight gain.) 180 tablet 0   No current facility-administered medications for this visit.     No Known Allergies  Social History   Socioeconomic History  . Marital status: Widowed    Spouse name: Not  on file  . Number of children: 2  . Years of education: PHD  . Highest education level: Not on file  Occupational History  . Occupation: Retired PhD Pharmacist, hospital [employer]  Social Needs  . Financial resource strain: Not on file  . Food insecurity    Worry: Not on file    Inability: Not on file  . Transportation needs    Medical: Not on file    Non-medical: Not on file  Tobacco Use  . Smoking status: Never Smoker  . Smokeless tobacco: Never Used  Substance and Sexual Activity  . Alcohol use: No  . Drug use: No  . Sexual activity: Not on file  Lifestyle  . Physical activity    Days per week: Not on file    Minutes per session: Not on file  . Stress: Not on file  Relationships  . Social Herbalist on phone: Not on file    Gets together: Not on file    Attends religious service: Not on file    Active member of club or organization: Not on file    Attends meetings of clubs or organizations: Not on file    Relationship status: Not on file  . Intimate partner violence    Fear of current or ex partner: Not on file    Emotionally abused: Not on file    Physically abused: Not on file    Forced sexual activity: Not on file  Other Topics Concern  . Not on file  Social History Narrative   Patient is widowed and lives with her two adopted  children.   Patient is retired.   Patient has two children of her own and two adopted children.   Patient has a PHD.   Patient is right-handed.    Family History  Problem Relation Age of Onset  . Diabetes Mother   . Heart disease Mother   . Prostate cancer Father   . Hypertension Brother   . Prostate cancer Brother     Review of Systems:  As stated in the HPI and otherwise negative.   BP (!) 142/54   Pulse 67   Ht 5' 2.5" (1.588 m)   Wt 163 lb 12.8 oz (74.3 kg)   SpO2 98%   BMI 29.48 kg/m   Physical Examination: General: Well developed, well nourished, NAD  HEENT: OP clear, mucus membranes moist  SKIN: warm, dry. No rashes. Neuro: No focal deficits  Musculoskeletal: Muscle strength 5/5 all ext  Psychiatric: Mood and affect normal  Neck: No JVD, no carotid bruits, no thyromegaly, no lymphadenopathy.  Lungs:Clear bilaterally, no wheezes, rhonci, crackles Cardiovascular: Regular rate and rhythm. Loud, harsh, late peaking systolic murmur.  Abdomen:Soft. Bowel sounds present. Non-tender.  Extremities: No lower extremity edema. Pulses are 2 + in the bilateral DP/PT.  EKG:  EKG is not ordered today. The ekg from 09/09/18 is reviewed today and shows sinus with non-specific T wave abn.   Echo 09/10/18:  1. The left ventricle has normal systolic function with an ejection fraction of 60-65%. The cavity size was normal. There is severe asymmetric left ventricular hypertrophy with mild concentric LV hypertrophy.  2. The right ventricle has normal systolic function. The cavity was normal. There is no increase in right ventricular wall thickness. Right ventricular systolic pressure is moderately elevated.  3. Left atrial size was mildly dilated.  4. The aortic valve is tricuspid. Moderate thickening of the aortic valve. Severe calcifcation of the aortic valve. Aortic  valve regurgitation is moderate by color flow Doppler. Severe stenosis of the aortic valve. Moderate aortic annular  calcification  noted. AV Vmax: 379.20 cm/s. AV Mean Grad: 76mmHg . AV Area (VTI): 0.42 cm. LVOT/AV VTI ratio: 0.23.  5. The aorta is normal unless otherwise noted.  6. The inferior vena cava was dilated in size with >50% respiratory variability.  FINDINGS  Left Ventricle: The left ventricle has normal systolic function, with an ejection fraction of 60-65%. The cavity size was normal. There is severe asymmetric left ventricular hypertrophy. Left ventricular diastolic Doppler parameters are consistent with  pseudonormalization. Elevated left ventricular end-diastolic pressure No evidence of left ventricular regional wall motion abnormalities..  Right Ventricle: The right ventricle has normal systolic function. The cavity was normal. There is no increase in right ventricular wall thickness. Right ventricular systolic pressure is moderately elevated.  Left Atrium: Left atrial size was mildly dilated.  Right Atrium: Right atrial size was normal in size.  Interatrial Septum: No atrial level shunt detected by color flow Doppler.  Pericardium: There is no evidence of pericardial effusion.  Mitral Valve: The mitral valve is normal in structure. Mitral valve regurgitation is mild by color flow Doppler.  Tricuspid Valve: The tricuspid valve is normal in structure. Tricuspid valve regurgitation is mild by color flow Doppler.  Aortic Valve: The aortic valve is tricuspid Moderate thickening of the aortic valve. Severe calcifcation of the aortic valve. Aortic valve regurgitation is moderate by color flow Doppler. There is Severe stenosis of the aortic valve, with a calculated  valve area of 0.42 cm. Moderate aortic annular calcification noted.  Pulmonic Valve: The pulmonic valve was normal in structure. Pulmonic valve regurgitation is trivial by color flow Doppler.  Aorta: The aorta is normal unless otherwise noted.  Venous: The inferior vena cava measures 1.95 cm, is dilated in size  with greater than 50% respiratory variability.    +--------------+--------++ LEFT VENTRICLE              +----------------+---------++ +--------------+--------++      Diastology                PLAX 2D                     +----------------+---------++ +--------------+--------++      LV e' lateral:  5.96 cm/s LVIDd:        3.65 cm       +----------------+---------++ +--------------+--------++      LV E/e' lateral:14.5      LVIDs:        2.91 cm       +----------------+---------++ +--------------+--------++      LV e' medial:   4.29 cm/s LV PW:        1.15 cm       +----------------+---------++ +--------------+--------++      LV E/e' medial: 20.2      LV IVS:       1.52 cm       +----------------+---------++ +--------------+--------++ LVOT diam:    1.55 cm  +--------------+--------++ LV SV:        24 ml    +--------------+--------++ LV SV Index:  12.60    +--------------+--------++ LVOT Area:    1.89 cm +--------------+--------++                        +--------------+--------++   +------------------+---------++ LV Volumes (MOD)            +------------------+---------++ LV area d, A2C:  30.10 cm +------------------+---------++ LV area d, A4C:   31.10 cm +------------------+---------++ LV area s, A2C:   18.20 cm +------------------+---------++ LV area s, A4C:   23.10 cm +------------------+---------++ LV major d, A2C:  7.47 cm   +------------------+---------++ LV major d, A4C:  8.22 cm   +------------------+---------++ LV major s, A2C:  6.30 cm   +------------------+---------++ LV major s, A4C:  7.26 cm   +------------------+---------++ LV vol d, MOD A2C:103.0 ml  +------------------+---------++ LV vol d, MOD A4C:97.2 ml   +------------------+---------++ LV vol s, MOD A2C:44.1 ml   +------------------+---------++ LV vol s, MOD A4C:61.3 ml    +------------------+---------++ LV SV MOD A2C:    58.9 ml   +------------------+---------++ LV SV MOD A4C:    97.2 ml   +------------------+---------++ LV SV MOD BP:     49.3 ml   +------------------+---------++  +---------------+----------++ RIGHT VENTRICLE           +---------------+----------++ RV S prime:    10.10 cm/s +---------------+----------++ TAPSE (M-mode):1.6 cm     +---------------+----------++ RVSP:          55.1 mmHg  +---------------+----------++  +---------------+-------++-----------++ LEFT ATRIUM           Index       +---------------+-------++-----------++ LA diam:       4.20 cm2.33 cm/m  +---------------+-------++-----------++ LA Vol (A2C):  53.8 ml29.89 ml/m +---------------+-------++-----------++ LA Vol (A4C):  57.2 ml31.78 ml/m +---------------+-------++-----------++ LA Biplane Vol:55.7 ml30.94 ml/m +---------------+-------++-----------++ +------------+---------++-----------++ RIGHT ATRIUM         Index       +------------+---------++-----------++ RA Pressure:8.00 mmHg            +------------+---------++-----------++ RA Area:    12.20 cm            +------------+---------++-----------++ RA Volume:  25.00 ml 13.89 ml/m +------------+---------++-----------++  +------------------+------------++ +-----------------+---------++ AORTIC VALVE                   PULMONIC VALVE             +------------------+------------++ +-----------------+---------++ AV Area (Vmax):   0.42 cm     PR End Diast Vel:1.88 msec +------------------+------------++ +-----------------+---------++ AV Area (Vmean):  0.38 cm     +------------------+------------++ AV Area (VTI):    0.42 cm     +------------------+------------++ AV Vmax:          379.20 cm/s  +------------------+------------++ AV Vmean:         278.600 cm/s  +------------------+------------++ AV VTI:           0.960 m      +------------------+------------++ AV Peak Grad:     43mmHg       +------------------+------------++ AV Mean Grad:     60mmHg       +------------------+------------++ LVOT Vmax:        84.90 cm/s   +------------------+------------++ LVOT Vmean:       55.800 cm/s  +------------------+------------++ LVOT VTI:         0.216 m      +------------------+------------++ LVOT/AV VTI ratio:0.23         +------------------+------------++ AR PHT:           227 msec     +------------------+------------++   +-------------+-------++ AORTA                +-------------+-------++ Ao Root diam:2.50 cm +-------------+-------++ Ao Asc diam: 3.30 cm +-------------+-------++  +--------------+----------++ +---------------+-----------++ MITRAL VALVE             TRICUSPID VALVE            +--------------+----------++ +---------------+-----------++  MV Area (PHT):5.02 cm   TR Peak grad:  47.1 mmHg   +--------------+----------++ +---------------+-----------++ MV PHT:       43.79 msec TR Vmax:       343.00 cm/s +--------------+----------++ +---------------+-----------++ MV Decel Time:151 msec   Estimated RAP: 8.00 mmHg   +--------------+----------++ +---------------+-----------++ +--------------+----------++ RVSP:          55.1 mmHg   MV E velocity:86.55 cm/s +---------------+-----------++ +--------------+----------++ MV A velocity:67.90 cm/s +--------------+-------+ +--------------+----------++ SHUNTS                MV E/A ratio: 1.27       +--------------+-------+ +--------------+----------++ Systemic VTI: 0.22 m                               +--------------+-------+                              Systemic Diam:1.55 cm                              +--------------+-------+  +---------+-------+ IVC               +---------+-------+ IVC diam:1.95 cm +---------+-------+   Cardiac cath 10/23/18: RA: 4 mmHg RV: 54/9 mmHg PA: 54/17 mmHg, mean PAP 34 mmhg. PCWP: 25 mmHg with tall V wave  CO: 5.2 L/min CI: 3 L/min/m2  LM: Normal LAD: Normal LCx:: Normal RCA: Ostial calcific at least 60% stenosis. Ostial RPDA 20% stenosis.   Recent Labs: 09/09/2018: ALT 19 09/12/2018: Magnesium 2.1 10/16/2018: BUN 40; Creatinine, Ser 3.00; Platelets 191 10/23/2018: Hemoglobin 10.2; Potassium 4.0; Sodium 138     Wt Readings from Last 3 Encounters:  10/26/18 163 lb 12.8 oz (74.3 kg)  10/23/18 156 lb (70.8 kg)  09/28/18 155 lb (70.3 kg)     Other studies Reviewed: Additional studies/ records that were reviewed today include: Echo images, cath films, hospital notes. EKG Review of the above records demonstrates: severe AS, moderate AI   Assessment and Plan:   1. Severe Aortic Valve Stenosis: She has severe, stage D aortic valve stenosis. I have personally reviewed the echo images. The aortic valve is thickened, calcified with limited leaflet mobility. There is moderate aortic valve insufficiency. I think she would benefit from AVR. Given advanced age, She is not a good candidate for conventional AVR by surgical approach. I think she may be a good candidate for TAVR.   STS Risk Score: Risk of Mortality: 6.795% Renal Failure: 31.125% Permanent Stroke: 6.064% Prolonged Ventilation: 22.503% DSW Infection: 0.128% Reoperation: 6.719% Morbidity or Mortality: 46.703% Short Length of Stay: 10.870% Long Length of Stay: 17.149%   I have reviewed the natural history of aortic stenosis with the patient and their family members  who are present today. We have discussed the limitations of medical therapy and the poor prognosis associated with symptomatic aortic stenosis. We have reviewed potential treatment options, including palliative medical therapy, conventional surgical aortic valve replacement, and  transcatheter aortic valve replacement. We discussed treatment options in the context of the patient's specific comorbid medical conditions.   She would like to proceed with planning for TAVR. Her cath and carotid artery dopplers have been completed. Risks and benefits of the valve procedure are reviewed with the patient. We will arrange a cardiac CT, CTA of the chest/abdomen and pelvis and a PT assessment and will  then refer her to see one of the CT surgeons on our TAVR team. Will check a BMET today. Proceed cautiously with CT scans given stage 4 CKD.      Current medicines are reviewed at length with the patient today.  The patient does not have concerns regarding medicines.  The following changes have been made:  no change  Labs/ tests ordered today include:   Orders Placed This Encounter  Procedures  . Basic metabolic panel     Disposition:   FU with the valve team.    Signed, Lauree Chandler, MD 10/26/2018 9:18 AM    [address], [place], [place]  [id number] Phone: [phone]; Fax: [phone]

## 2018-10-25 NOTE — Telephone Encounter (Signed)
Called pt to inform her abut the message below. Pt understood

## 2018-10-26 ENCOUNTER — Encounter: Payer: Self-pay | Admitting: Cardiovascular Disease

## 2018-10-26 ENCOUNTER — Other Ambulatory Visit: Payer: Self-pay

## 2018-10-26 ENCOUNTER — Ambulatory Visit: Payer: Medicare Other | Admitting: Cardiology

## 2018-10-26 ENCOUNTER — Ambulatory Visit (INDEPENDENT_AMBULATORY_CARE_PROVIDER_SITE_OTHER): Payer: Medicare Other | Admitting: Cardiovascular Disease

## 2018-10-26 VITALS — BP 142/54 | HR 67 | Ht 62.5 in | Wt 163.8 lb

## 2018-10-26 DIAGNOSIS — N184 Chronic kidney disease, stage 4 (severe): Secondary | ICD-10-CM | POA: Diagnosis not present

## 2018-10-26 DIAGNOSIS — I350 Nonrheumatic aortic (valve) stenosis: Secondary | ICD-10-CM

## 2018-10-26 LAB — BASIC METABOLIC PANEL
BUN/Creatinine Ratio: 14 (ref 12–28)
BUN: 40 mg/dL — ABNORMAL HIGH (ref 8–27)
CO2: 21 mmol/L (ref 20–29)
Calcium: 9.6 mg/dL (ref 8.7–10.3)
Chloride: 102 mmol/L (ref 96–106)
Creatinine, Ser: 2.89 mg/dL — ABNORMAL HIGH (ref 0.57–1.00)
GFR calc Af Amer: 17 mL/min/{1.73_m2} — ABNORMAL LOW (ref >59)
GFR calc non Af Amer: 15 mL/min/{1.73_m2} — ABNORMAL LOW (ref >59)
Glucose: 170 mg/dL — ABNORMAL HIGH (ref 65–99)
Potassium: 4.6 mmol/L (ref 3.5–5.2)
Sodium: 139 mmol/L (ref 134–144)

## 2018-10-26 NOTE — Patient Instructions (Signed)
Your physician recommends that you continue on your current medications as directed. Please refer to the Current Medication list given to you today.   Your physician recommends that you return for lab work in:  [place] physician recommends that you schedule a follow-up appointment in: PENDING [place]

## 2018-10-29 ENCOUNTER — Ambulatory Visit: Payer: Medicare Other | Admitting: Cardiology

## 2018-10-29 ENCOUNTER — Other Ambulatory Visit: Payer: Self-pay

## 2018-10-29 DIAGNOSIS — I350 Nonrheumatic aortic (valve) stenosis: Secondary | ICD-10-CM

## 2018-10-30 ENCOUNTER — Other Ambulatory Visit: Payer: Self-pay

## 2018-10-30 DIAGNOSIS — I350 Nonrheumatic aortic (valve) stenosis: Secondary | ICD-10-CM

## 2018-10-30 DIAGNOSIS — N289 Disorder of kidney and ureter, unspecified: Secondary | ICD-10-CM

## 2018-10-31 ENCOUNTER — Ambulatory Visit (INDEPENDENT_AMBULATORY_CARE_PROVIDER_SITE_OTHER): Admitting: Cardiology

## 2018-10-31 ENCOUNTER — Other Ambulatory Visit: Payer: Self-pay

## 2018-10-31 ENCOUNTER — Encounter: Payer: Self-pay | Admitting: Cardiology

## 2018-10-31 VITALS — BP 132/39 | HR 60 | Temp 96.2°F | Ht 62.5 in | Wt 162.0 lb

## 2018-10-31 DIAGNOSIS — I2510 Atherosclerotic heart disease of native coronary artery without angina pectoris: Secondary | ICD-10-CM | POA: Diagnosis not present

## 2018-10-31 DIAGNOSIS — I350 Nonrheumatic aortic (valve) stenosis: Secondary | ICD-10-CM

## 2018-10-31 DIAGNOSIS — N184 Chronic kidney disease, stage 4 (severe): Secondary | ICD-10-CM | POA: Diagnosis not present

## 2018-10-31 NOTE — Progress Notes (Signed)
Primary Physician:  Merrilee Seashore, MD   Patient ID: [name], female    DOB: 1941-01-05, 78 y.o.   MRN: [id number]  Subjective:    Chief Complaint  Patient presents with  . Aortic stenosis with mitral and aortic insufficiency  . Coronary artery disease involving native coronary artery of   . Follow-up    cath    HPI: [name]  is a 78 y.o. female  with history of hypertension, hyperlipidemia, diabetes with stage III to IV chronic kidney disease (now follows Dr. Hollie Salk), severe aortic stenosis, CAD, and history of CVA in 2015 and again recently admitted on 09/09/18 with right posterior cerebral artery distribution stroke.She also has asymptomatic marked S. Bradycardia.   She had recently been seen for symptoms of angina and noted to have progression of aortic stenosis to severe.  She underwent coronary angiogram on 10/23/2018 revealing at least 60% calcified stenosis of ostial RCA, intervention was not performed.  She was referred to structural valve clinic, recently evaluated by Dr. Susanne Greenhouse and is now currently in the process of being worked up for upcoming TAVR.  She presents for post cath follow-up.  States that she is doing well since her procedure, no complications from right groin access site.  She does have fatigue and shortness of breath if she does too much.  She does have residual dense left homonymous visual field deficit and gait instability as a result from her recent stroke.  No symptoms of chest pain. No leg swelling or dizziness.   States that her blood pressure has been well controlled. She is followed by Dr. Hollie Salk for CKD stage 4, but has not yet required dialysis.   Past Medical History:  Diagnosis Date  . Anginal pain ([place])   . Arthritis   . Cerebrovascular disease   . CKD (chronic kidney disease)    Sees Dr Deterding  . Coronary artery disease   . Depression   . Diabetes ([place])    INSULIN DEPENDENT  . Diabetic peripheral neuropathy ([place])   .  Diabetic retinopathy ([place])   . Diverticulitis   . Dyslipidemia   . GERD (gastroesophageal reflux disease)   . Headache(784.0) 01/30/2013  . Heart murmur   . History of cerebrovascular accident 01/30/2013  . Hyperlipidemia   . Hypertension   . Migraine   . Obesity   . Stroke ([place])    x 2 no residulal  . TIA (transient ischemic attack)    " MULTIPLE PER PATIENT "; had left sided weakness- rehab and wants without assistance    Past Surgical History:  Procedure Laterality Date  . ABDOMINAL HYSTERECTOMY    . AV FISTULA PLACEMENT Left 12/18/2017   Procedure: ARTERIOVENOUS (AV) FISTULA CREATION ARM;  Surgeon: Marty Heck, MD;  Location: [place];  Service: Vascular;  Laterality: Left;  . bilateral cataract surgery    . CARDIAC CATHETERIZATION  01/07/2014   DR Hernandez RESECTION  02/1999  . COLONOSCOPY    . COLOSTOMY  02/1999  . COLOSTOMY CLOSURE  07/1999  . EYE SURGERY Left 2019  . FISTULA SUPERFICIALIZATION Left 06/29/2018   Procedure: FISTULA SUPERFICIALIZATION LEFT BRACHIOCEPHALIC;  Surgeon: Marty Heck, MD;  Location: [place];  Service: Vascular;  Laterality: Left;  . LEFT HEART CATHETERIZATION WITH CORONARY ANGIOGRAM N/A 01/07/2014   Procedure: LEFT HEART CATHETERIZATION WITH CORONARY ANGIOGRAM;  Surgeon: Laverda Page, MD;  Location: [facility] CATH LAB;  Service: Cardiovascular;  Laterality: N/A;  . middle cerebral  artery stent placement Right   . OTHER SURGICAL HISTORY     laser surgery  . PTCA  01/07/2014   DES to RCA    DR Einar Gip  . right knee surgery Right    for infection  . RIGHT/LEFT HEART CATH AND CORONARY ANGIOGRAPHY N/A 10/23/2018   Procedure: RIGHT/LEFT HEART CATH AND CORONARY ANGIOGRAPHY;  Surgeon: Nigel Mormon, MD;  Location: [facility] LAB;  Service: Cardiovascular;  Laterality: N/A;    Social History   Socioeconomic History  . Marital status: Widowed    Spouse name: Not on file  . Number of children: 2  . Years of education: PHD   . Highest education level: Not on file  Occupational History  . Occupation: Retired PhD Pharmacist, hospital [employer]  Social Needs  . Financial resource strain: Not on file  . Food insecurity    Worry: Not on file    Inability: Not on file  . Transportation needs    Medical: Not on file    Non-medical: Not on file  Tobacco Use  . Smoking status: Never Smoker  . Smokeless tobacco: Never Used  Substance and Sexual Activity  . Alcohol use: No  . Drug use: No  . Sexual activity: Not on file  Lifestyle  . Physical activity    Days per week: Not on file    Minutes per session: Not on file  . Stress: Not on file  Relationships  . Social Herbalist on phone: Not on file    Gets together: Not on file    Attends religious service: Not on file    Active member of club or organization: Not on file    Attends meetings of clubs or organizations: Not on file    Relationship status: Not on file  . Intimate partner violence    Fear of current or ex partner: Not on file    Emotionally abused: Not on file    Physically abused: Not on file    Forced sexual activity: Not on file  Other Topics Concern  . Not on file  Social History Narrative   Patient is widowed and lives with her two adopted children.   Patient is retired.   Patient has two children of her own and two adopted children.   Patient has a PHD.   Patient is right-handed.    Review of Systems  Constitution: Positive for malaise/fatigue. Negative for decreased appetite, weight gain and weight loss.  Eyes: Negative for visual disturbance.  Cardiovascular: Positive for dyspnea on exertion. Negative for chest pain, claudication, leg swelling, orthopnea, palpitations and syncope.  Respiratory: Negative for hemoptysis and wheezing.   Endocrine: Negative for cold intolerance and heat intolerance.  Hematologic/Lymphatic: Does not bruise/bleed easily.  Skin: Negative for nail changes.  Musculoskeletal: Negative for muscle weakness  and myalgias.  Gastrointestinal: Negative for abdominal pain, change in bowel habit, nausea and vomiting.  Neurological: Negative for difficulty with concentration, dizziness, focal weakness and headaches.  Psychiatric/Behavioral: Negative for altered mental status and suicidal ideas.  All other systems reviewed and are negative.     Objective:  Blood pressure (!) 132/39, pulse 60, temperature (!) 96.2 F (35.7 C), height 5' 2.5" (1.588 m), weight 162 lb (73.5 kg), SpO2 98 %. Body mass index is 29.16 kg/m.    Physical Exam  Constitutional: She is oriented to person, place, and time. Vital signs are normal. She appears well-developed and well-nourished.  Mildly obese  HENT:  Head: Normocephalic  and atraumatic.  Neck: Normal range of motion.  Cardiovascular: Normal rate, regular rhythm and intact distal pulses.  Murmur heard.  Midsystolic murmur is present with a grade of 3/6 at the upper right sternal border radiating to the neck. Pulses:      Carotid pulses are on the right side with bruit and on the left side with bruit.      Femoral pulses are 2+ on the right side and 2+ on the left side.      Popliteal pulses are 1+ on the right side and 1+ on the left side.       Dorsalis pedis pulses are 2+ on the right side and 2+ on the left side.       Posterior tibial pulses are 2+ on the right side and 2+ on the left side.  Left upper arm AV fistula present  Pulmonary/Chest: Effort normal and breath sounds normal. No accessory muscle usage. No respiratory distress.  Abdominal: Soft. Bowel sounds are normal.  Musculoskeletal: Normal range of motion.  Neurological: She is alert and oriented to person, place, and time.  Skin: Skin is warm and dry.  Vitals reviewed.  Radiology:  MRA of head 09/10/2018:  1. Acute infarct right occipital lobe. No associated hemorrhage in the infarct. Chronic microhemorrhage right parietal white matter appears separate. 2. Right M1 stent obscures  evaluation of the right middle cerebral artery on MRA 3. Right posterior cerebral artery patent. 4. Occluded distal P3 branch on the left.  Laboratory examination:    CMP Latest Ref Rng & Units 10/26/2018 10/23/2018 10/23/2018  Glucose 65 - 99 mg/dL 170(H) - -  BUN 8 - 27 mg/dL 40(H) - -  Creatinine 0.57 - 1.00 mg/dL 2.89(H) - -  Sodium 134 - 144 mmol/L 139 138 137  Potassium 3.5 - 5.2 mmol/L 4.6 4.0 4.0  Chloride 96 - 106 mmol/L 102 - -  CO2 20 - 29 mmol/L 21 - -  Calcium 8.7 - 10.3 mg/dL 9.6 - -  Total Protein 6.5 - 8.1 g/dL - - -  Total Bilirubin 0.3 - 1.2 mg/dL - - -  Alkaline Phos 38 - 126 U/L - - -  AST 15 - 41 U/L - - -  ALT 0 - 44 U/L - - -   CBC Latest Ref Rng & Units 10/23/2018 10/23/2018 10/23/2018  WBC 3.4 - 10.8 x10E3/uL - - -  Hemoglobin 12.0 - 15.0 g/dL 10.2(L) 10.5(L) 10.2(L)  Hematocrit 36.0 - 46.0 % 30.0(L) 31.0(L) 30.0(L)  Platelets 150 - 450 x10E3/uL - - -   Lipid Panel     Component Value Date/Time   CHOL 147 09/10/2018 0507   TRIG 64 09/10/2018 0507   HDL 76 09/10/2018 0507   CHOLHDL 1.9 09/10/2018 0507   VLDL 13 09/10/2018 0507   LDLCALC 58 09/10/2018 0507   HEMOGLOBIN A1C Lab Results  Component Value Date   HGBA1C 7.6 (H) 09/10/2018   MPG 171.42 09/10/2018   TSH No results for input(s): TSH in the last 8760 hours.  PRN Meds:. There are no discontinued medications. Current Meds  Medication Sig  . acetaminophen (TYLENOL) 325 MG tablet Take 650 mg by mouth every 6 (six) hours as needed (pain/headaches.).  Marland Kitchen allopurinol (ZYLOPRIM) 100 MG tablet Take 50 mg by mouth daily.  Marland Kitchen amLODipine (NORVASC) 10 MG tablet Take 1 tablet by mouth every day  . aspirin EC 81 MG tablet Take 81 mg by mouth daily.  . B-D INS SYR ULTRAFINE 1CC/30G 30G  X 1/2" 1 ML MISC Inject 1 each into the skin 3 (three) times daily between meals.  . calcitRIOL (ROCALTROL) 0.25 MCG capsule Take 0.25 mcg by mouth daily.   . clopidogrel (PLAVIX) 75 MG tablet Take 1 tablet (75 mg total)  by mouth daily.  . folic acid (FOLVITE) 1 MG tablet Take 1 mg by mouth daily.  Marland Kitchen HUMALOG KWIKPEN 100 UNIT/ML KwikPen 4-6 Units by Subconjunctival route 3 (three) times daily before meals. Sliding Scale Insulin  . insulin glargine (LANTUS) 100 UNIT/ML injection Inject 12 Units into the skin at bedtime.   Marland Kitchen labetalol (NORMODYNE) 100 MG tablet Take 100 mg by mouth 2 (two) times daily.  Marland Kitchen leflunomide (ARAVA) 10 MG tablet Take 10 mg by mouth daily.  . niacin (NIASPAN) 500 MG CR tablet Take 500 mg by mouth daily.   . nitroGLYCERIN (NITROSTAT) 0.4 MG SL tablet PLACE 1 TABLET (0.4 MG TOTAL) UNDER THE TONGUE EVERY 5 MINUTES X 3 DOSES AS NEEDED FOR CHEST PAIN.  Marland Kitchen nortriptyline (PAMELOR) 10 MG capsule Take 10 mg by mouth daily.  . ONE TOUCH ULTRA TEST test strip 1 each by Other route 3 (three) times daily between meals.  Marland Kitchen oxyCODONE-acetaminophen (PERCOCET) 5-325 MG tablet Take 1 tablet by mouth every 6 (six) hours as needed for severe pain.  . polyvinyl alcohol (TEARS AGAIN) 1.4 % ophthalmic solution Place 1 drop into both eyes 3 (three) times daily as needed for dry eyes.  Marland Kitchen PRISTIQ 100 MG 24 hr tablet Take 100 mg by mouth daily.   . rosuvastatin (CRESTOR) 20 MG tablet Take 20 mg by mouth daily.  Marland Kitchen torsemide (DEMADEX) 20 MG tablet Take 1 tablet (20 mg total) by mouth daily. May take additional dose in the afternoon for swelling, shortness of breath or weight gain. (Patient taking differently: Take 40 mg by mouth daily. May take additional dose in the afternoon for swelling, shortness of breath or weight gain.)    Cardiac Studies:   Right and left heart cath 10/23/18:  RA: 4 mmHg RV: 54/9 mmHg PA: 54/17 mmHg, mean PAP 34 mmhg. PCWP: 25 mmHg with tall V wave  CO: 5.2 L/min CI: 3 L/min/m2  LM: Normal LAD: Normal LCx:: Normal RCA: Ostial calcific at least 60% stenosis. Ostial RPDA 20% stenosis.   Right coronary artery was difficult to engage. I had to use multiple catheters, and was finally  able to engage with [facility] catheter. there was dampnening of the pressure with catheter engagement. Ostium is at least 60% stenosed with moderate calcification. There was no signifciant improvement with IC NTG. I did not perform FFR in order to limit contrast use in this patient with Cr of 3.0, not on dialysis.   Echo 08/23/2018:  1. Normal LV systolic function with EF 55%. Left ventricle cavity is normal in size. Mild concentric hypertrophy of the left ventricle. Normal global wall motion. 2. Left atrial cavity is mildly dilated. 3. Trileaflet aortic valve. Moderate aortic annular and leaflet calcification. Severe aortic valve stenosis. Aortic valve mean gradient of 40 mmHg, Vmax of 4.0 m/s. Calculated aortic valve area by continuity equation is 0.7 cm. Moderate (Grade III) aortic regurgitation. 4. Moderate (Grade II) mitral regurgitation. 5. Moderate tricuspid regurgitation. Moderate pulmonary hypertension. Estimated pulmonary artery systolic pressure is 50 mmHg. 6. Compared to previous study in 2017, there is interval increase in severity of aortic stenosis, mitral and tricuspid regurgitation, pulmonary hypertension. Aortic regurgitation is new.  Lexiscan Myoview Stress Test 08/06/2018: Stress EKG is non-diagnostic, as  this is pharmacological stress test. Myocardial perfusion imaging is normal. Left ventricular ejection fraction is  50% with normal wall motion. Low risk study.  Carotid artery duplex 12/04/2013: No evidence of hemodynamically significant stenosis in the bilateral carotid bifurcation vessels. There is evidence of minimal heterogeneous plaque in the bilateral carotid artery.   Assessment:     ICD-10-CM   1. Severe aortic stenosis  I35.0   2. Coronary artery disease involving native coronary artery of native heart without angina pectoris  I25.10   3. Chronic kidney disease (CKD), stage IV (severe) (HCC)  N18.4     EKG 07/16/2018: Sinus bradycardia at 56 bpm, normal  axis, poor R wave progression, cannot exclude anterior infarct old. T wave inversion/flattening in inferolateral leads, cannot exclude ischemia. Compared to EKG in Jan 2019, T wave abnormality in lateral leads not as prominent.   Recommendations:   Patient is currently being worked up for upcoming TAVR, tentatively scheduled for 10/27.  She has upcoming appointment with Dr. Zenia Resides on 10/23.  Symptomatically she is overall doing well and is stable.  Right groin access site is healing well, small hematoma, but has good pulse and is nontender.  I have discussed her coronary angiogram results, she did have calcified 60% ostial RCA that was not felt to be significant.  Dr. Virgina Jock has discussed with valve team, if she continues to have symptoms after her procedure, will recommend stress testing to see if stenosis is significant.  We will continue with present medications.  No changes were made today.  I will also send a copy of our note to Dr. Hollie Salk to keep her informed in view of chronic kidney disease stage IV.  She is to see her for follow-up soon.  I will plan to tentatively see her back in approximately 6 weeks, but may need to move this to appointment depending upon her surgery.  We will plan to see her sometime after TAVR.   Miquel Dunn, MSN, APRN, FNP-C [facility]. [place] Office: [phone] Fax: [phone]

## 2018-11-01 ENCOUNTER — Telehealth: Payer: Self-pay | Admitting: Physician Assistant

## 2018-11-01 ENCOUNTER — Ambulatory Visit (HOSPITAL_COMMUNITY)
Admission: RE | Admit: 2018-11-01 | Discharge: 2018-11-01 | Disposition: A | Discharge status: Home / Self Care | Payer: Medicare Other | Source: Ambulatory Visit | Attending: Cardiovascular Disease | Admitting: Cardiovascular Disease

## 2018-11-01 ENCOUNTER — Other Ambulatory Visit: Payer: Self-pay

## 2018-11-01 DIAGNOSIS — I350 Nonrheumatic aortic (valve) stenosis: Secondary | ICD-10-CM

## 2018-11-01 DIAGNOSIS — N184 Chronic kidney disease, stage 4 (severe): Secondary | ICD-10-CM

## 2018-11-01 LAB — BASIC METABOLIC PANEL
Anion gap: 11 (ref 5–15)
BUN: 38 mg/dL — ABNORMAL HIGH (ref 8–23)
CO2: 24 mmol/L (ref 22–32)
Calcium: 9.5 mg/dL (ref 8.9–10.3)
Chloride: 105 mmol/L (ref 98–111)
Creatinine, Ser: 2.7 mg/dL — ABNORMAL HIGH (ref 0.44–1.00)
GFR calc Af Amer: 19 mL/min — ABNORMAL LOW (ref >60)
GFR calc non Af Amer: 16 mL/min — ABNORMAL LOW (ref >60)
Glucose, Bld: 138 mg/dL — ABNORMAL HIGH (ref 70–99)
Potassium: 4.3 mmol/L (ref 3.5–5.1)
Sodium: 140 mmol/L (ref 135–145)

## 2018-11-01 MED ORDER — SODIUM CHLORIDE 0.9 % WEIGHT BASED INFUSION
3.0000 mL/kg/h | INTRAVENOUS | Status: AC
  Administered 2018-11-01: 3 mL/kg/h via INTRAVENOUS

## 2018-11-01 MED ORDER — IOHEXOL 350 MG/ML SOLN
100.0000 mL | Freq: Once | INTRAVENOUS | Status: AC | PRN
  Administered 2018-11-01: 80 mL via INTRAVENOUS

## 2018-11-01 MED ORDER — SODIUM CHLORIDE 0.9 % WEIGHT BASED INFUSION: 1.0000 mL/kg/h | INTRAVENOUS | Status: DC

## 2018-11-01 NOTE — Telephone Encounter (Signed)
  [place] VALVE TEAM  Patient is present today for her scheduled CT scans for pre-TAVR planning.  She is on the CKD protocol with a lessened dose of IV contrast and IV fluids before and after her CT scans.  The patient has CKD stage IV with a GFR under 20.  We were called by CT technologist about needing a nephrologist to sign off on CT scans with contrast given real possibility of this pushing her into needing hemodialysis. The patient has a fistula already in place. The patient has been counseled about the risks of IV contrast worsening her renal function and she is willing to proceed.  I tried to call Dr. Hollie Salk with nephrology to get her consent; however, she is out of town.  I called Dr. Marval Regal, who is the on-call nephrologist covering for Dr. Hollie Salk.  He reviewed the patient's chart and provided consent to proceed with CT scans with contrast.  Angelena Form PA-C  [facility]

## 2018-11-05 ENCOUNTER — Encounter: Payer: Medicare Other | Admitting: Thoracic Surgery (Cardiothoracic Vascular Surgery)

## 2018-11-05 ENCOUNTER — Other Ambulatory Visit: Payer: Self-pay

## 2018-11-05 ENCOUNTER — Other Ambulatory Visit: Payer: Medicare Other | Admitting: *Deleted

## 2018-11-05 DIAGNOSIS — N184 Chronic kidney disease, stage 4 (severe): Secondary | ICD-10-CM

## 2018-11-05 DIAGNOSIS — I350 Nonrheumatic aortic (valve) stenosis: Secondary | ICD-10-CM

## 2018-11-06 LAB — BASIC METABOLIC PANEL
BUN/Creatinine Ratio: 14 (ref 12–28)
BUN: 60 mg/dL — ABNORMAL HIGH (ref 8–27)
CO2: 23 mmol/L (ref 20–29)
Calcium: 9.9 mg/dL (ref 8.7–10.3)
Chloride: 101 mmol/L (ref 96–106)
Creatinine, Ser: 4.18 mg/dL — ABNORMAL HIGH (ref 0.57–1.00)
GFR calc Af Amer: 11 mL/min/{1.73_m2} — ABNORMAL LOW (ref >59)
GFR calc non Af Amer: 10 mL/min/{1.73_m2} — ABNORMAL LOW (ref >59)
Glucose: 174 mg/dL — ABNORMAL HIGH (ref 65–99)
Potassium: 5.5 mmol/L — ABNORMAL HIGH (ref 3.5–5.2)
Sodium: 137 mmol/L (ref 134–144)

## 2018-11-07 ENCOUNTER — Other Ambulatory Visit: Payer: Self-pay

## 2018-11-07 DIAGNOSIS — I350 Nonrheumatic aortic (valve) stenosis: Secondary | ICD-10-CM

## 2018-11-08 ENCOUNTER — Institutional Professional Consult (permissible substitution) (INDEPENDENT_AMBULATORY_CARE_PROVIDER_SITE_OTHER): Payer: Medicare Other | Admitting: Thoracic Surgery (Cardiothoracic Vascular Surgery)

## 2018-11-08 ENCOUNTER — Other Ambulatory Visit: Payer: Self-pay

## 2018-11-08 ENCOUNTER — Ambulatory Visit: Payer: Medicare Other | Admitting: Occupational Therapy

## 2018-11-08 VITALS — BP 154/65 | HR 70 | Temp 97.5°F | Resp 20 | Ht 62.0 in | Wt 160.0 lb

## 2018-11-08 DIAGNOSIS — R278 Other lack of coordination: Secondary | ICD-10-CM

## 2018-11-08 DIAGNOSIS — R2689 Other abnormalities of gait and mobility: Secondary | ICD-10-CM | POA: Diagnosis present

## 2018-11-08 DIAGNOSIS — H5340 Unspecified visual field defects: Secondary | ICD-10-CM | POA: Diagnosis present

## 2018-11-08 DIAGNOSIS — I350 Nonrheumatic aortic (valve) stenosis: Secondary | ICD-10-CM | POA: Diagnosis not present

## 2018-11-08 DIAGNOSIS — M6281 Muscle weakness (generalized): Secondary | ICD-10-CM

## 2018-11-08 DIAGNOSIS — I639 Cerebral infarction, unspecified: Secondary | ICD-10-CM | POA: Diagnosis present

## 2018-11-08 DIAGNOSIS — I69354 Hemiplegia and hemiparesis following cerebral infarction affecting left non-dominant side: Secondary | ICD-10-CM | POA: Diagnosis present

## 2018-11-08 DIAGNOSIS — R449 Unspecified symptoms and signs involving general sensations and perceptions: Secondary | ICD-10-CM | POA: Diagnosis present

## 2018-11-08 DIAGNOSIS — R448 Other symptoms and signs involving general sensations and perceptions: Secondary | ICD-10-CM

## 2018-11-08 NOTE — Progress Notes (Signed)
[facility]  MULTIDISCIPLINARY HEART VALVE CLINIC  CARDIOTHORACIC SURGERY CONSULTATION REPORT  Referring Provider is Kennedy, Susan Bowen, MD PCP is Susan Seashore, MD  Chief Complaint  Patient presents with   Aortic Stenosis    Surgical eval fro TAVR, review all studies    HPI:  Patient is a mildly obese 78 year old African-American female with history of aortic stenosis, coronary artery disease, hypertension, chronic diastolic congestive heart failure, stage IV chronic kidney disease, insulin-dependent diabetes mellitus with diabetic retinopathy and peripheral neuropathy, cerebrovascular disease with multiple previous strokes, GE reflux disease, diverticular disease status post partial colectomy, and hyperlipidemia who has been referred for surgical consultation to discuss treatment options for management of severe symptomatic aortic stenosis.  Patient states that she suffered her first stroke in 1999.  She has had a total of 3 in 2001 she underwent stenting of intracranial artery because of cerebrovascular disease.  She has been left with mild left-sided weakness as a residual from her most recent stroke, which was in August of this year.  Her cardiac history dates back more than 10 years and she has been followed by Dr. Einar Kennedy Dr. Virgina Kennedy.  Previous echocardiograms have documented the presence of normal left ventricular systolic function with aortic stenosis that has gradually progressed in severity.  Over the past year the patient has experienced gradual progression of increased exercise tolerance and worsening symptoms of congestive heart failure.  Follow-up echocardiogram performed September 10, 2018 revealed normal left ventricular systolic function with severe aortic stenosis.  She underwent diagnostic cardiac catheterization revealing moderate ostial stenosis of the right coronary artery was otherwise nonobstructive coronary artery disease.  There was mild pulmonary  hypertension.  The patient was referred to the multidisciplinary heart valve clinic and has been evaluated previously by Dr. Angelena Kennedy.  CT angiography was performed and the patient referred for surgical consultation.  Patient is widowed and lives alone locally in [place].  She is accompanied by her brother for her office consultation visit today who also lives locally in [place] and is very supportive.  The patient has been retired since 1999 when she suffered her first stroke.  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  She has remained functionally independent throughout retirement, although her health problems have caused a gradual decline over the last several years.  She suffered a third stroke this past August.  She has mild residual left-sided weakness primarily in her left upper arm.  She states that her balance is a little bit off but she is still ambulatory.  This affected her eyesight as well but that is also improved.  She describes a long history of exertional shortness of breath and fatigue that has been gradually getting worse.  Over the last several months she has had significant problems with shortness of breath.  She gets short of breath just walking to the bathroom back.  She occasionally has resting shortness of breath and she cannot lay flat in bed.  She now props herself up using 3 pillows.  She has not had too much lower extremity edema although she checks her weight daily and occasionally takes an extra diuretic as needed if she has increased fluid overload.  She has not had any chest pain or chest tightness either with activity or at rest.  Past Medical History:  Diagnosis Date   Anginal pain ([place])    Arthritis    Cerebrovascular disease    CKD (chronic kidney disease)    Susan Dr  Kennedy   Coronary artery disease    Depression    Diabetes ([name])    INSULIN DEPENDENT   Diabetic peripheral neuropathy ([facility])    Diabetic retinopathy  ([name])    Diverticulitis    Dyslipidemia    GERD (gastroesophageal reflux disease)    Headache(784.0) 01/30/2013   Heart murmur    History of cerebrovascular accident 01/30/2013   Hyperlipidemia    Hypertension    Migraine    Obesity    Stroke ([place])    x 2 no residulal   TIA (transient ischemic attack)    " MULTIPLE PER PATIENT "; had left sided weakness- rehab and wants without assistance    Past Surgical History:  Procedure Laterality Date   ABDOMINAL HYSTERECTOMY     AV FISTULA PLACEMENT Left 12/18/2017   Procedure: ARTERIOVENOUS (AV) FISTULA CREATION ARM;  Surgeon: Susan Heck, MD;  Location: [facility];  Service: Vascular;  Laterality: Left;   bilateral cataract surgery     CARDIAC CATHETERIZATION  01/07/2014   DR [name]   COLON RESECTION  02/1999   COLONOSCOPY     COLOSTOMY  02/1999   COLOSTOMY CLOSURE  07/1999   EYE SURGERY Left 2019   FISTULA SUPERFICIALIZATION Left 06/29/2018   Procedure: FISTULA SUPERFICIALIZATION LEFT BRACHIOCEPHALIC;  Surgeon: Susan Heck, MD;  Location: [facility];  Service: Vascular;  Laterality: Left;   LEFT HEART CATHETERIZATION WITH CORONARY ANGIOGRAM N/A 01/07/2014   Procedure: LEFT HEART CATHETERIZATION WITH CORONARY ANGIOGRAM;  Surgeon: Susan Page, MD;  Location: [facility] C[facility];  Service: Cardiovascular;  Laterality: N/A;   middle cerebral artery stent placement Right    OTHER SURGICAL HISTORY     laser surgery   PTCA  01/07/2014   DES to RCA    DR [name]   right knee surgery Right    for infection   RIGHT/LEFT HEART CATH AND CORONARY ANGIOGRAPHY N/A 10/23/2018   Procedure: RIGHT/LEFT HEART CATH AND CORONARY ANGIOGRAPHY;  Surgeon: Susan Mormon, MD;  Location: [facility];  Service: Cardiovascular;  Laterality: N/A;    Family History  Problem Relation Age of Onset   Diabetes Mother    Heart disease Mother    Prostate cancer Father    Hypertension Brother    Prostate cancer  Brother     Social History   Socioeconomic History   Marital status: Widowed    Spouse name: Not on file   Number of children: 2   Years of education: PHD   Highest education level: Not on file  Occupational History   Occupation: Retired PhD Pharmacist, hospital [employer]  Social Needs   Financial resource strain: Not on file   Food insecurity    Worry: Not on file    Inability: Not on file   Transportation needs    Medical: Not on file    Non-medical: Not on file  Tobacco Use   Smoking status: Never Smoker   Smokeless tobacco: Never Used  Substance and Sexual Activity   Alcohol use: No   Drug use: No   Sexual activity: Not on file  Lifestyle   Physical activity    Days per week: Not on file    Minutes per session: Not on file   Stress: Not on file  Relationships   Social connections    Talks on phone: Not on file    Gets together: Not on file    Attends religious service: Not on file    Active member of club  or organization: Not on file    Attends meetings of clubs or organizations: Not on file    Relationship status: Not on file   Intimate partner violence    Fear of current or ex partner: Not on file    Emotionally abused: Not on file    Physically abused: Not on file    Forced sexual activity: Not on file  Other Topics Concern   Not on file  Social History Narrative   Patient is widowed and lives with her two adopted children.   Patient is retired.   Patient has two children of her own and two adopted children.   Patient has a PHD.   Patient is right-handed.    Current Outpatient Medications  Medication Sig Dispense Refill   acetaminophen (TYLENOL) 325 MG tablet Take 650 mg by mouth every 6 (six) hours as needed for moderate pain or headache.      allopurinol (ZYLOPRIM) 100 MG tablet Take 50 mg by mouth daily.     amLODipine (NORVASC) 10 MG tablet Take 1 tablet by mouth every day (Patient taking differently: Take 10 mg by mouth daily. ) 30 tablet  11   aspirin EC 81 MG tablet Take 81 mg by mouth daily.     B-D INS SYR ULTRAFINE 1CC/30G 30G X 1/2" 1 ML MISC Inject 1 each into the skin 3 (three) times daily between meals.     calcitRIOL (ROCALTROL) 0.25 MCG capsule Take 0.25 mcg by mouth daily.      clopidogrel (PLAVIX) 75 MG tablet Take 1 tablet (75 mg total) by mouth daily. 90 tablet 1   folic acid (FOLVITE) 1 MG tablet Take 1 mg by mouth daily.     HUMALOG KWIKPEN 100 UNIT/ML KwikPen Inject 2-8 Units into the skin 3 (three) times daily before meals.      insulin glargine (LANTUS) 100 UNIT/ML injection Inject 16 Units into the skin at bedtime.      labetalol (NORMODYNE) 100 MG tablet Take 100 mg by mouth 2 (two) times daily.     leflunomide (ARAVA) 10 MG tablet Take 10 mg by mouth daily.     niacin (NIASPAN) 500 MG CR tablet Take 500 mg by mouth daily.      nitroGLYCERIN (NITROSTAT) 0.4 MG SL tablet PLACE 1 TABLET (0.4 MG TOTAL) UNDER THE TONGUE EVERY 5 MINUTES X 3 DOSES AS NEEDED FOR CHEST PAIN. (Patient taking differently: Place 0.4 mg under the tongue every 5 (five) minutes as needed for chest pain. ) 25 tablet 1   nortriptyline (PAMELOR) 10 MG capsule Take 10 mg by mouth daily.     ONE TOUCH ULTRA TEST test strip 1 each by Other route 3 (three) times daily between meals.     oxyCODONE-acetaminophen (PERCOCET) 5-325 MG tablet Take 1 tablet by mouth every 6 (six) hours as needed for severe pain. 12 tablet 0   polyvinyl alcohol (TEARS AGAIN) 1.4 % ophthalmic solution Place 1 drop into both eyes 3 (three) times daily as needed for dry eyes.     PRISTIQ 100 MG 24 hr tablet Take 100 mg by mouth daily.      rosuvastatin (CRESTOR) 20 MG tablet Take 20 mg by mouth daily.     torsemide (DEMADEX) 20 MG tablet Take 1 tablet (20 mg total) by mouth daily. May take additional dose in the afternoon for swelling, shortness of breath or weight gain. (Patient taking differently: Take 40 mg by mouth daily. May take additional dose in the  afternoon for swelling, shortness of breath or weight gain.) 180 tablet 0   No current facility-administered medications for this visit.     No Known Allergies    Review of Systems:   General:  decreased appetite, decreased energy, no weight gain, + weight loss, no fever  Cardiac:  no chest pain with exertion, no chest pain at rest, +SOB with low level exertion, occasional resting SOB, + PND, + orthopnea, no palpitations, no arrhythmia, no atrial fibrillation, occasional mild LE edema, no dizzy spells, no syncope  Respiratory:  + shortness of breath, no home oxygen, no productive cough, + chronic dry cough, no bronchitis, no wheezing, no hemoptysis, no asthma, no pain with inspiration or cough, no sleep apnea, no CPAP at night  GI:   no difficulty swallowing, + reflux, no frequent heartburn, no hiatal hernia, no abdominal pain, + constipation, no diarrhea, no hematochezia, no hematemesis, no melena  GU:   no dysuria,  no frequency, no urinary tract infection, no hematuria, no kidney stones, + kidney disease  Vascular:  no pain suggestive of claudication, no pain in feet, occasional leg cramps, no varicose veins, no DVT, no non-healing foot ulcer  Neuro:   + stroke, + TIA's, no seizures, no headaches, + temporary blindness one eye,  no slurred speech, + peripheral neuropathy, + chronic pain, + instability of gait, no memory/cognitive dysfunction  Musculoskeletal: + arthritis, no joint swelling, no myalgias, mild difficulty walking, slightly limited mobility   Skin:   no rash, no itching, no skin infections, no pressure sores or ulcerations  Psych:   + anxiety, no depression, no nervousness, + unusual recent stress  Eyes:   no blurry vision, + floaters, + recent vision changes, does not wears glasses or contacts  ENT:   no hearing loss, no loose or painful teeth, no dentures, last saw dentist July 2020  Hematologic:  + easy bruising, no abnormal bleeding, no clotting disorder, no frequent  epistaxis  Endocrine:  + diabetes, does check CBG's at home           Physical Exam:   BP (!) 154/65    Pulse 70    Temp (!) 97.5 F (36.4 C) (Skin)    Resp 20    Ht 5\' 2"  (1.575 m)    Wt 160 lb (72.6 kg)    SpO2 98%    BMI 29.26 kg/m   General:  Mildly obese AA female NAD   HEENT:  Unremarkable   Neck:   no JVD, no bruits, no adenopathy   Chest:   clear to auscultation, symmetrical breath sounds, no wheezes, no rhonchi   CV:   RRR, grade IV/VI crescendo/decrescendo murmur heard best at RSB,  no diastolic murmur  Abdomen:  soft, non-tender, no masses   Extremities:  warm, well-perfused, pulses diminished, no LE edema  Rectal/GU  Deferred  Neuro:   Grossly non-focal and symmetrical throughout  Skin:   Clean and dry, no rashes, no breakdown   Diagnostic Tests:    ECHOCARDIOGRAM REPORT       Patient Name:   [name] Date of Exam: 09/10/2018 Medical Rec #:  [id number]     Height:       62.0 in Accession #:    [id number]    Weight:       173.6 lb Date of Birth:  1940-02-02     BSA:          1.80 m Patient Age:    51 years  BP:           123/45 mmHg Patient Gender: F             HR:           65 bpm. Exam Location:  Inpatient    Procedure: 2D Echo, Cardiac Doppler and Color Doppler  Indications:    Stroke.   History:        Patient has prior history of Echocardiogram examinations. CAD                 Stroke Aortic Valve Disease Signs/Symptoms: Murmur and Chest                 Pain Risk Factors: Hypertension, Dyslipidemia and Diabetes.   Sonographer:    Roseanna Rainbow RDCS Referring Phys: [place]    1. The left ventricle has normal systolic function with an ejection fraction of 60-65%. The cavity size was normal. There is severe asymmetric left ventricular hypertrophy with mild concentric LV hypertrophy.  2. The right ventricle has normal systolic function. The cavity was normal. There is no increase in right ventricular wall  thickness. Right ventricular systolic pressure is moderately elevated.  3. Left atrial size was mildly dilated.  4. The aortic valve is tricuspid. Moderate thickening of the aortic valve. Severe calcifcation of the aortic valve. Aortic valve regurgitation is moderate by color flow Doppler. Severe stenosis of the aortic valve. Moderate aortic annular calcification  noted. AV Vmax: 379.20 cm/s. AV Mean Grad: 56mmHg . AV Area (VTI): 0.42 cm. LVOT/AV VTI ratio: 0.23.  5. The aorta is normal unless otherwise noted.  6. The inferior vena cava was dilated in size with >50% respiratory variability.  FINDINGS  Left Ventricle: The left ventricle has normal systolic function, with an ejection fraction of 60-65%. The cavity size was normal. There is severe asymmetric left ventricular hypertrophy. Left ventricular diastolic Doppler parameters are consistent with  pseudonormalization. Elevated left ventricular end-diastolic pressure No evidence of left ventricular regional wall motion abnormalities..  Right Ventricle: The right ventricle has normal systolic function. The cavity was normal. There is no increase in right ventricular wall thickness. Right ventricular systolic pressure is moderately elevated.  Left Atrium: Left atrial size was mildly dilated.  Right Atrium: Right atrial size was normal in size.  Interatrial Septum: No atrial level shunt detected by color flow Doppler.  Pericardium: There is no evidence of pericardial effusion.  Mitral Valve: The mitral valve is normal in structure. Mitral valve regurgitation is mild by color flow Doppler.  Tricuspid Valve: The tricuspid valve is normal in structure. Tricuspid valve regurgitation is mild by color flow Doppler.  Aortic Valve: The aortic valve is tricuspid Moderate thickening of the aortic valve. Severe calcifcation of the aortic valve. Aortic valve regurgitation is moderate by color flow Doppler. There is Severe stenosis of the aortic  valve, with a calculated  valve area of 0.42 cm. Moderate aortic annular calcification noted.  Pulmonic Valve: The pulmonic valve was normal in structure. Pulmonic valve regurgitation is trivial by color flow Doppler.  Aorta: The aorta is normal unless otherwise noted.  Venous: The inferior vena cava measures 1.95 cm, is dilated in size with greater than 50% respiratory variability.    +--------------+--------++  LEFT VENTRICLE                 +----------------+---------++ +--------------+--------++       Diastology  PLAX 2D                        +----------------+---------++ +--------------+--------++       LV e' lateral:   5.96 cm/s    LVIDd:         3.65 cm         +----------------+---------++ +--------------+--------++       LV E/e' lateral: 14.5         LVIDs:         2.91 cm         +----------------+---------++ +--------------+--------++       LV e' medial:    4.29 cm/s    LV PW:         1.15 cm         +----------------+---------++ +--------------+--------++       LV E/e' medial:  20.2         LV IVS:        1.52 cm         +----------------+---------++ +--------------+--------++  LVOT diam:     1.55 cm    +--------------+--------++  LV SV:         24 ml      +--------------+--------++  LV SV Index:   12.60      +--------------+--------++  LVOT Area:     1.89 cm   +--------------+--------++                            +--------------+--------++   +------------------+---------++  LV Volumes (MOD)               +------------------+---------++  LV area d, A2C:    30.10 cm   +------------------+---------++  LV area d, A4C:    31.10 cm   +------------------+---------++  LV area s, A2C:    18.20 cm   +------------------+---------++  LV area s, A4C:    23.10 cm   +------------------+---------++  LV major d, A2C:   7.47 cm     +------------------+---------++  LV major d, A4C:   8.22 cm     +------------------+---------++  LV major s, A2C:    6.30 cm     +------------------+---------++  LV major s, A4C:   7.26 cm     +------------------+---------++  LV vol d, MOD A2C: 103.0 ml    +------------------+---------++  LV vol d, MOD A4C: 97.2 ml     +------------------+---------++  LV vol s, MOD A2C: 44.1 ml     +------------------+---------++  LV vol s, MOD A4C: 61.3 ml     +------------------+---------++  LV SV MOD A2C:     58.9 ml     +------------------+---------++  LV SV MOD A4C:     97.2 ml     +------------------+---------++  LV SV MOD BP:      49.3 ml     +------------------+---------++  +---------------+----------++  RIGHT VENTRICLE              +---------------+----------++  RV S prime:     10.10 cm/s   +---------------+----------++  TAPSE (M-mode): 1.6 cm       +---------------+----------++  RVSP:           55.1 mmHg    +---------------+----------++  +---------------+-------++-----------++  LEFT ATRIUM              Index         +---------------+-------++-----------++  LA diam:        4.20 cm  2.33 cm/m    +---------------+-------++-----------++  LA Vol (A2C):   53.8 ml  29.89 ml/m   +---------------+-------++-----------++  LA Vol (A4C):   57.2 ml  31.78 ml/m   +---------------+-------++-----------++  LA Biplane Vol: 55.7 ml  30.94 ml/m   +---------------+-------++-----------++ +------------+---------++-----------++  RIGHT ATRIUM            Index         +------------+---------++-----------++  RA Pressure: 8.00 mmHg                +------------+---------++-----------++  RA Area:     12.20 cm                +------------+---------++-----------++  RA Volume:   25.00 ml   13.89 ml/m   +------------+---------++-----------++  +------------------+------------++ +-----------------+---------++  AORTIC VALVE                       PULMONIC VALVE                +------------------+------------++ +-----------------+---------++  AV Area (Vmax):    0.42 cm        PR End Diast Vel: 1.88  msec   +------------------+------------++ +-----------------+---------++  AV Area (Vmean):   0.38 cm       +------------------+------------++  AV Area (VTI):     0.42 cm       +------------------+------------++  AV Vmax:           379.20 cm/s    +------------------+------------++  AV Vmean:          278.600 cm/s   +------------------+------------++  AV VTI:            0.960 m        +------------------+------------++  AV Peak Grad:      54mmHg         +------------------+------------++  AV Mean Grad:      96mmHg         +------------------+------------++  LVOT Vmax:         84.90 cm/s     +------------------+------------++  LVOT Vmean:        55.800 cm/s    +------------------+------------++  LVOT VTI:          0.216 m        +------------------+------------++  LVOT/AV VTI ratio: 0.23           +------------------+------------++  AR PHT:            227 msec       +------------------+------------++   +-------------+-------++  AORTA                   +-------------+-------++  Ao Root diam: 2.50 cm   +-------------+-------++  Ao Asc diam:  3.30 cm   +-------------+-------++  +--------------+----------++ +---------------+-----------++  MITRAL VALVE                 TRICUSPID VALVE               +--------------+----------++ +---------------+-----------++  MV Area (PHT): 5.02 cm      TR Peak grad:   47.1 mmHg     +--------------+----------++ +---------------+-----------++  MV PHT:        43.79 msec    TR Vmax:        343.00 cm/s   +--------------+----------++ +---------------+-----------++  MV Decel Time: 151 msec      Estimated RAP:  8.00 mmHg     +--------------+----------++ +---------------+-----------++ +--------------+----------++  RVSP:  55.1 mmHg      MV E velocity: 86.55 cm/s   +---------------+-----------++ +--------------+----------++  MV A velocity: 67.90 cm/s   +--------------+-------+ +--------------+----------++  SHUNTS                   MV E/A  ratio:  1.27         +--------------+-------+ +--------------+----------++  Systemic VTI:  0.22 m                                +--------------+-------+                               Systemic Diam: 1.55 cm                               +--------------+-------+  +---------+-------+  IVC                +---------+-------+  IVC diam: 1.95 cm  +---------+-------+    Fransico Him MD Electronically signed by Fransico Him MD Signature Date/Time: 09/10/2018/12:56:15 PM     RIGHT/LEFT HEART CATH AND CORONARY ANGIOGRAPHY  Conclusion  RA: 4 mmHg RV: 54/9 mmHg PA: 54/17 mmHg, mean PAP 34 mmhg. PCWP: 25 mmHg with tall V wave  CO: 5.2 L/min CI: 3 L/min/m2  LM: Normal LAD: Normal LCx:: Normal RCA: Ostial calcific at least 60% stenosis. Ostial RPDA 20% stenosis.   Right coronary artery was difficult to engage. I had to use multiple catheters, and was finally able to engage with [facility] catheter. there was dampnening of the pressure with catheter engagement. Ostium is at least 60% stenosed with moderate calcification. There was no signifciant improvement with IC NTG. I did not perform FFR in order to limit contrast use in this patient with Cr of 3.0, not on dialysis.   Recommendation: Will discuss with structural valve clinic. If functional evaluation of RCA is felt necesary prior to TAVR/SAVR, would consider non-invasive stress test.   Susan Mormon, MD [facility] Pager: [phone] Office: [phone] If no answer Cell [phone]    Recommendations  Antiplatelet/Anticoag Recommend Aspirin 81mg  daily for moderate CAD. Structural valve clinic referral  Indications  Severe aortic stenosis [I35.0 (ICD-10-CM)]  Procedural Details  Technical Details Procedures: 1. Ultrasound guided right common femoral artery access 2. Right heart catheterization 3. Selective left and right coronary angiography 4. Conscious sedation monitoring 68  min  Indication: Severe aortic stenosis  History: 78 y.o.African American femalewith hypertension, hyperlipidemia, advanced CKD, h/o CVA/TIA, new diagnosis of severe aortic stenosis, CAD s/p dRCA PCI 2015, type 2 DM,   Right common femoral artery visualized under ultrasound guidance. Vessel and flow identified. Image placed on the chart. Successful ultrasound guided right common artery access.   Diagnostic Angiography: Catheter/s advances over guidewire under fluoroscopy Left coronary artery: 5 Fr JL 4  Right coronary artery: 5 3DRC   Pressures tracings obtained in right atrium, right ventricle, pulmonary artery, and pulmonary capillary wedge position.   Anticoagulation:  None  Hemostasis: Manual compression  Total contrast used: 50 cc   Total fluoro time: 21.6 min Air Kerma: 1.8 Gy  All wires and catheters removed out of the body at the end of the procedure Final angiogram showed no dissection/perforation         Estimated blood loss <50 mL.   During this procedure  medications were administered to achieve and maintain moderate conscious sedation while the patient's heart rate, blood pressure, and oxygen saturation were continuously monitored and I was present face-to-face 100% of this time.  Medications (Filter: Administrations occurring from 10/23/18 1340 to 10/23/18 1538) (important)  Continuous medications are totaled by the amount administered until 10/23/18 1538.  Medication Rate/Dose/Volume Action  Date Time   Heparin (Porcine) in NaCl 1000-0.9 UT/500ML-% SOLN (mL) 500 mL Given 10/23/18 1348   Total dose as of 10/23/18 1538        500 mL        Heparin (Porcine) in NaCl 1000-0.9 UT/500ML-% SOLN (mL) 500 mL Given 10/23/18 1348   Total dose as of 10/23/18 1538        500 mL        lidocaine (PF) (XYLOCAINE) 1 % injection (mL) 2 mL Given 10/23/18 1403   Total dose as of 10/23/18 1538 2 mL Given 1403   4 mL        fentaNYL (SUBLIMAZE) injection (mcg) 25  mcg Given 10/23/18 1411   Total dose as of 10/23/18 1538        25 mcg        midazolam (VERSED) injection (mg) 1 mg Given 10/23/18 1411   Total dose as of 10/23/18 1538        1 mg        nitroGLYCERIN 1 mg/10 mL (100 mcg/mL) - IR/CATH LAB (mcg) 200 mcg Given 10/23/18 1509   Total dose as of 10/23/18 1538        200 mcg        iohexol (OMNIPAQUE) 350 MG/ML injection (mL) 50 mL Given 10/23/18 1525   Total dose as of 10/23/18 1538        50 mL        labetalol (NORMODYNE) injection (mg) 10 mg Given 10/23/18 1517   Total dose as of 10/23/18 1538        10 mg        Sedation Time  Sedation Time Physician-1: 1 hour 8 minutes 4 seconds  Contrast  Medication Name Total Dose  iohexol (OMNIPAQUE) 350 MG/ML injection 50 mL    Radiation/Fluoro  Fluoro time: 21.6 (min) DAP: [id number] (cGycm2) Cumulative Air Kerma: [id number] (mGy)  Complications  Complications documented before study signed (10/23/2018 5:37 PM)   RIGHT/LEFT HEART CATH AND CORONARY ANGIOGRAPHY  None Documented by Susan Mormon, MD 10/23/2018 5:35 PM  Date Found: 10/23/2018  Time Range: Intraprocedure      Coronary Findings  Diagnostic Dominance: Right Left Main  Vessel is normal in caliber. Vessel is angiographically normal.  Left Anterior Descending  Vessel is normal in caliber. Vessel is angiographically normal.  Left Circumflex  Vessel is normal in caliber. Vessel is angiographically normal.  Right Coronary Artery  Ost RCA lesion 60% stenosed  Ost RCA lesion is 60% stenosed. The lesion is moderately calcified. Right coronary artery was difficult to engage. I had to use multiple catheters, and was finally able to engage with [facility] catheter. there was dampnening of the pressure with catheter engagement. Ostium is at least 60% stenosed with moderate calcification. There was no signifciant improvement with IC NTG. I did not perform FFR in order to limit contrast use in this patient with Cr of 3.0, not on dialysis.  Will discuss with structural valve clinic. If functional evaluation of RCA is felt necesary prior to TAVR/SAVR, would consider non-invasive stress test.  Right Posterior Descending Artery  RPDA lesion 20% stenosed  RPDA lesion is 20% stenosed.  Intervention  No interventions have been documented. Right Heart  Right Heart Pressures RA: 4 mmHg RV: 54/9 mmHg PA: 54/17 mmHg, mean PAP 34 mmhg. PCWP: 25 mmHg with tall V wave  CO: 5.2 L/min CI: 3 L/min/m2  Left Heart  Left Ventricle LV was not entered  Coronary Diagrams  Diagnostic Dominance: Right  Intervention  Implants   No implant documentation for this case.  Syngo Images  Show images for CARDIAC CATHETERIZATION  Images on Long Term Storage  Show images for Vivero, ARLY BLEE to Procedure Log  Procedure Log    Hemo Data   Most Recent Value  Fick Cardiac Output 5.2 L/min  Fick Cardiac Output Index 3.02 (L/min)/BSA  RA A Wave 8 mmHg  RA V Wave 5 mmHg  RA Mean 4 mmHg  RV Systolic Pressure 54 mmHg  RV Diastolic Pressure 1 mmHg  RV EDP 9 mmHg  PA Systolic Pressure 54 mmHg  PA Diastolic Pressure 17 mmHg  PA Mean 34 mmHg  PW A Wave 26 mmHg  PW V Wave 38 mmHg  PW Mean 25 mmHg  AO Systolic Pressure 0000000 mmHg  AO Diastolic Pressure 48 mmHg  AO Mean 98 mmHg  QP/QS 1  TPVR Index 11.26 HRUI  TSVR Index 29.15 HRUI  PVR SVR Ratio 0.11  TPVR/TSVR Ratio 0.39      ADDENDUM: Correction: Annular measurements of the aortic valve borderline between 20 and 23 mm Edwards-SAPIEN 3 Ultra valve (not between 23 and 26 mm as previously stated).   Electronically Signed   By: Ena Dawley   On: 11/08/2018 12:25   Addended by Dorothy Spark, MD on 11/08/2018 12:27 PM    ADDENDUM REPORT: 11/01/2018 23:56  CLINICAL DATA:  78 year old female with severe aortic stenosis being evaluated for a TAVR procedure.  EXAM: Cardiac TAVR CT  TECHNIQUE: The patient was scanned on a [employer]. A  120 kV retrospective scan was triggered in the descending thoracic aorta at 111 HU's. Gantry rotation speed was 250 msecs and collimation was .6 mm. No beta blockade or nitro were given. The 3D data set was reconstructed in 5% intervals of the R-R cycle. Systolic and diastolic phases were analyzed on a dedicated work station using MPR, MIP and VRT modes. The patient received 80 cc of contrast.  FINDINGS: Aortic Valve: Trileaflet aortic valve with severely calcified leaflets with severely restricted leaflets opening and no calcifications extending into the LVOT.  Aorta: Normal size with mild diffuse atherosclerotic plaque and calcifications and no dissection.  Sinotubular Junction: 26 x 24 mm  Ascending Thoracic Aorta: 37 x 36 mm  Aortic Arch: 24 x 23 mm  Descending Thoracic Aorta: 22 x 21 mm  Sinus of Valsalva Measurements:  Non-coronary: 27 mm  Right -coronary: 26 mm  Left -coronary: 28 mm  Coronary Artery Height above Annulus:  Left Main: 14 mm  Right Coronary: 15 mm  Virtual Basal Annulus Measurements:  Maximum/Minimum Diameter: 24.0 x 19.6 mm  Mean Diameter: 20.9 mm  Perimeter: 66.9 mm  Area: 343 mm2  Optimum Fluoroscopic Angle for Delivery: LAO 18 CAU 21  Dilated pulmonary artery measuring 35 mm suggestive of pulmonary hypertension.  IMPRESSION: 1. Trileaflet aortic valve with severely calcified leaflets with severely restricted leaflets opening and no calcifications extending into the LVOT. Aortic valve calcium score 2413 consistent with severe aortic stenosis. Annular measurements borderline between 23 and 26 mm valves, but  would favor 26 mm Edwards-SAPIEN 3 valve.  2. Sufficient coronary to annulus distance.  3. Optimum Fluoroscopic Angle for Delivery:  LAO 18 CAU 21.  4. No thrombus in the left atrial appendage.   Electronically Signed   By: Ena Dawley   On: 11/01/2018 23:56      CT ANGIOGRAPHY CHEST,  ABDOMEN AND PELVIS  TECHNIQUE: Multidetector CT imaging through the chest, abdomen and pelvis was performed using the standard protocol during bolus administration of intravenous contrast. Multiplanar reconstructed images and MIPs were obtained and reviewed to evaluate the vascular anatomy.  CONTRAST:  27mL OMNIPAQUE IOHEXOL 350 MG/ML SOLN  COMPARISON:  None.  FINDINGS: CTA CHEST FINDINGS  Cardiovascular: Heart size is mildly enlarged. Pericardial thickening and calcification overlying the free wall the right ventricle where there is focal distortion of the cardiac contour, which may suggest underlying constriction. There is aortic atherosclerosis, as well as atherosclerosis of the great vessels of the mediastinum and the coronary arteries, including calcified atherosclerotic plaque in the left anterior descending, left circumflex and right coronary arteries. Severe thickening calcification of the aortic valve.  Mediastinum/Lymph Nodes: No pathologically enlarged mediastinal or hilar lymph nodes. Esophagus is unremarkable in appearance. No axillary lymphadenopathy.  Lungs/Pleura: Trace right pleural effusion lying dependently. No acute consolidative airspace disease. No suspicious appearing pulmonary nodules or masses are noted.  Musculoskeletal/Soft Tissues: There are no aggressive appearing lytic or blastic lesions noted in the visualized portions of the skeleton.  CTA ABDOMEN AND PELVIS FINDINGS  Hepatobiliary: No suspicious cystic or solid hepatic lesions. No intra or extrahepatic biliary ductal dilatation. Gallbladder is normal in appearance.  Pancreas: No pancreatic mass. No pancreatic ductal dilatation. No pancreatic or peripancreatic fluid collections or inflammatory changes.  Spleen: Unremarkable.  Adrenals/Urinary Tract: In the lower pole of the left kidney there is a somewhat ill-defined lesion which appears to have internal septations and  low level enhancement estimated to measure approximately 4.1 x 3.0 x 2.7 cm (axial image 317 of series 6 and coronal image 73 of series 14), considered indeterminate. Other low-attenuation lesions in both kidneys, compatible with simple cysts, largest of which measures 2.9 cm in the posterior aspect of the interpolar region of the left kidney. Several subcentimeter low-attenuation lesions in both kidneys, too small to characterize. No hydroureteronephrosis. Urinary bladder is unremarkable in appearance. Bilateral adrenal glands are normal in appearance.  Stomach/Bowel: Normal appearance of the stomach. No pathologic dilatation of small bowel or colon. Normal appendix.  Vascular/Lymphatic: Aortic atherosclerosis, with vascular findings and measurements pertinent to potential TAVR procedure, as detailed below. No aneurysm or dissection noted in the abdominal or pelvic vasculature. No lymphadenopathy noted in the abdomen or pelvis.  Reproductive: Status post hysterectomy. Ovaries are not confidently identified may be surgically absent or atrophic.  Other: No significant volume of ascites.  No pneumoperitoneum.  Musculoskeletal: There are no aggressive appearing lytic or blastic lesions noted in the visualized portions of the skeleton.  VASCULAR MEASUREMENTS PERTINENT TO TAVR:  AORTA:  Minimal Aortic Diameter-9 x 9 mm  Severity of Aortic Calcification-severe  RIGHT PELVIS:  Right Common Iliac Artery -  Minimal Diameter-8.5 x 8.0 mm  Tortuosity-mild  Calcification-mild-to-moderate  Right External Iliac Artery -  Minimal Diameter-6.5 x 6.4 mm  Tortuosity-moderate  Calcification-none  Right Common Femoral Artery -  Minimal Diameter-6.2 x 5.8 mm  Tortuosity-mild  Calcification-mild  LEFT PELVIS:  Left Common Iliac Artery -  Minimal Diameter-8.5 x 7.6 mm  Tortuosity-mild  Calcification-mild-to-moderate  Left External Iliac  Artery -  Minimal Diameter-6.9 x 6.5 mm  Tortuosity-mild  Calcification-none  Left Common Femoral Artery -  Minimal Diameter-5.9 x 6.3 mm  Tortuosity-mild  Calcification-none  Review of the MIP images confirms the above findings.  IMPRESSION: 1. Vascular findings and measurements pertinent to potential TAVR procedure, as detailed above. 2. Severe thickening calcification of the aortic valve, compatible with the reported clinical history of severe aortic stenosis. 3. Pericardial thickening calcification overlying the free wall of the right ventricle immediately anterior to the atrioventricular groove where there is also focal distortion of the cardiac contour, which may suggest constrictive physiology. Correlation with echocardiography is suggested if clinically appropriate. 4. Aortic atherosclerosis, in addition to 3 vessel coronary artery disease. 5. Indeterminate lesion in the lower pole of the left kidney estimated to measure approximately 4.1 x 3.0 x 2.7 cm, suspicious for potential cystic renal neoplasm. Follow-up nonemergent definitive characterization of this lesion with MRI of the abdomen with and without IV gadolinium is strongly recommended in the near future. 6. Additional incidental findings, as above.   Electronically Signed   By: Vinnie Langton [name]   On: 11/01/2018 15:03    EKG: NSR w/out significant AV conduction delay, borderline non-specific ST changes    Impression:  Patient has stage D severe symptomatic aortic stenosis.  She describes a gradual decline with worsening symptoms of exertional shortness of breath, increasing fatigue, and recent occasional episodes of resting shortness of breath with orthopnea and PND, consistent with chronic diastolic congestive heart failure, [place] Heart Association functional class IIIb.  I have personally reviewed the patient's recent transthoracic echocardiogram, diagnostic cardiac  catheterization, and CT angiograms.  Echocardiogram reveals normal left ventricular systolic function.  There is significant left ventricular hypertrophy with diastolic dysfunction.  The aortic valve is trileaflet with severe thickening, calcification, and restricted leaflet mobility involving all 3 leaflets of the aortic valve.  Peak velocity across aortic valve measured as high as 3.8 m/s corresponding to mean transvalvular gradient estimated 40 mmHg with aortic valve area calculated only 0.42 cm and DVI reported 0.23.  The severity of aortic stenosis is further exacerbated by the presence of relatively small diameter aortic root.  Diagnostic cardiac catheterization reveals 60% ostial stenosis of the right coronary artery but otherwise mild nonobstructive coronary artery disease.  There was moderate pulmonary hypertension.  The patient does not have symptoms suspicious for angina pectoris.  I agree the patient would benefit from elective aortic valve replacement.  Risks associated with conventional surgery would be very high because of the patient's advanced age, numerous comorbid medical problems, and the presence of relatively small size aortic root which would likely mandate a need for root enlargement or root replacement.  I would be very reluctant to consider this patient a candidate for conventional surgery.  Cardiac-gated CTA of the heart reveals anatomical characteristics consistent with aortic stenosis suitable for treatment by transcatheter aortic valve replacement without any significant complicating features and CTA of the aorta and iliac vessels demonstrate what appears to be adequate pelvic vascular access to facilitate a transfemoral approach.  Most recent twelve-lead EKG reveals normal sinus rhythm with no significant AV conduction delay.    Plan:  The patient and her brother counseled at length regarding treatment alternatives for management of severe symptomatic aortic stenosis.  Alternative approaches such as conventional aortic valve replacement, transcatheter aortic valve replacement, and continued medical therapy without intervention were compared and contrasted at length.  The risks associated with conventional surgical aortic valve replacement were discussed in detail,  as were expectations for post-operative convalescence, and why I would be reluctant to consider this patient a candidate for conventional surgery.  Issues specific to transcatheter aortic valve replacement were discussed including questions about long term valve durability, the potential for paravalvular leak, possible increased risk of need for permanent pacemaker placement, and other technical complications related to the procedure itself.  Long-term prognosis with medical therapy was discussed. This discussion was placed in the context of the patient's own specific clinical presentation and past medical history.  All of their questions have been addressed.  The patient desires to proceed with transcatheter aortic valve replacement in the near future.  We tentatively plan to proceed with surgery on November 13, 2018 presuming that follow-up preoperative blood work reveals stable or improved renal function.  Following the decision to proceed with transcatheter aortic valve replacement, a discussion has been held regarding what types of management strategies would be attempted intraoperatively in the event of life-threatening complications, including whether or not the patient would be considered a candidate for the use of cardiopulmonary bypass and/or conversion to open sternotomy for attempted surgical intervention.  The patient has been advised of a variety of complications that might develop including but not limited to risks of death, stroke, paravalvular leak, aortic dissection or other major vascular complications, aortic annulus rupture, device embolization, cardiac rupture or perforation, mitral regurgitation,  acute myocardial infarction, arrhythmia, heart block or bradycardia requiring permanent pacemaker placement, congestive heart failure, respiratory failure, renal failure, pneumonia, infection, other late complications related to structural valve deterioration or migration, or other complications that might ultimately cause a temporary or permanent loss of functional independence or other long term morbidity.  The patient provides full informed consent for the procedure as described and all questions were answered.   I spent in excess of 90 minutes during the conduct of this office consultation and >50% of this time involved direct face-to-face encounter with the patient for counseling and/or coordination of their care.     Valentina Gu. [name], MD 11/08/2018 11:59 AM

## 2018-11-08 NOTE — H&P (View-Only) (Signed)
[facility]  MULTIDISCIPLINARY HEART VALVE CLINIC  CARDIOTHORACIC SURGERY CONSULTATION REPORT  Referring Provider is Patwardhan, Reynold Bowen, MD PCP is Merrilee Seashore, MD  Chief Complaint  Patient presents with   Aortic Stenosis    Surgical eval fro TAVR, review all studies    HPI:  Patient is a mildly obese 78 year old African-American female with history of aortic stenosis, coronary artery disease, hypertension, chronic diastolic congestive heart failure, stage IV chronic kidney disease, insulin-dependent diabetes mellitus with diabetic retinopathy and peripheral neuropathy, cerebrovascular disease with multiple previous strokes, GE reflux disease, diverticular disease status post partial colectomy, and hyperlipidemia who has been referred for surgical consultation to discuss treatment options for management of severe symptomatic aortic stenosis.  Patient states that she suffered her first stroke in 1999.  She has had a total of 3 in 2001 she underwent stenting of intracranial artery because of cerebrovascular disease.  She has been left with mild left-sided weakness as a residual from her most recent stroke, which was in August of this year.  Her cardiac history dates back more than 10 years and she has been followed by Dr. Einar Gip Dr. Virgina Jock.  Previous echocardiograms have documented the presence of normal left ventricular systolic function with aortic stenosis that has gradually progressed in severity.  Over the past year the patient has experienced gradual progression of increased exercise tolerance and worsening symptoms of congestive heart failure.  Follow-up echocardiogram performed September 10, 2018 revealed normal left ventricular systolic function with severe aortic stenosis.  She underwent diagnostic cardiac catheterization revealing moderate ostial stenosis of the right coronary artery was otherwise nonobstructive coronary artery disease.  There was mild pulmonary  hypertension.  The patient was referred to the multidisciplinary heart valve clinic and has been evaluated previously by Dr. Angelena Form.  CT angiography was performed and the patient referred for surgical consultation.  Patient is widowed and lives alone locally in [place].  She is accompanied by her brother for her office consultation visit today who also lives locally in [place] and is very supportive.  The patient has been retired since 1999 when she suffered her first stroke.  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  She has remained functionally independent throughout retirement, although her health problems have caused a gradual decline over the last several years.  She suffered a third stroke this past August.  She has mild residual left-sided weakness primarily in her left upper arm.  She states that her balance is a little bit off but she is still ambulatory.  This affected her eyesight as well but that is also improved.  She describes a long history of exertional shortness of breath and fatigue that has been gradually getting worse.  Over the last several months she has had significant problems with shortness of breath.  She gets short of breath just walking to the bathroom back.  She occasionally has resting shortness of breath and she cannot lay flat in bed.  She now props herself up using 3 pillows.  She has not had too much lower extremity edema although she checks her weight daily and occasionally takes an extra diuretic as needed if she has increased fluid overload.  She has not had any chest pain or chest tightness either with activity or at rest.  Past Medical History:  Diagnosis Date   Anginal pain ([place])    Arthritis    Cerebrovascular disease    CKD (chronic kidney disease)    Sees Dr  Deterding   Coronary artery disease    Depression    Diabetes ([place])    INSULIN DEPENDENT   Diabetic peripheral neuropathy ([facility])    Diabetic retinopathy  ([place])    Diverticulitis    Dyslipidemia    GERD (gastroesophageal reflux disease)    Headache(784.0) 01/30/2013   Heart murmur    History of cerebrovascular accident 01/30/2013   Hyperlipidemia    Hypertension    Migraine    Obesity    Stroke ([place])    x 2 no residulal   TIA (transient ischemic attack)    " MULTIPLE PER PATIENT "; had left sided weakness- rehab and wants without assistance    Past Surgical History:  Procedure Laterality Date   ABDOMINAL HYSTERECTOMY     AV FISTULA PLACEMENT Left 12/18/2017   Procedure: ARTERIOVENOUS (AV) FISTULA CREATION ARM;  Surgeon: Marty Heck, MD;  Location: [facility];  Service: Vascular;  Laterality: Left;   bilateral cataract surgery     CARDIAC CATHETERIZATION  01/07/2014   DR Einar Gip   COLON RESECTION  02/1999   COLONOSCOPY     COLOSTOMY  02/1999   COLOSTOMY CLOSURE  07/1999   EYE SURGERY Left 2019   FISTULA SUPERFICIALIZATION Left 06/29/2018   Procedure: FISTULA SUPERFICIALIZATION LEFT BRACHIOCEPHALIC;  Surgeon: Marty Heck, MD;  Location: [facility];  Service: Vascular;  Laterality: Left;   LEFT HEART CATHETERIZATION WITH CORONARY ANGIOGRAM N/A 01/07/2014   Procedure: LEFT HEART CATHETERIZATION WITH CORONARY ANGIOGRAM;  Surgeon: Laverda Page, MD;  Location: [facility] C[facility];  Service: Cardiovascular;  Laterality: N/A;   middle cerebral artery stent placement Right    OTHER SURGICAL HISTORY     laser surgery   PTCA  01/07/2014   DES to RCA    DR Einar Gip   right knee surgery Right    for infection   RIGHT/LEFT HEART CATH AND CORONARY ANGIOGRAPHY N/A 10/23/2018   Procedure: RIGHT/LEFT HEART CATH AND CORONARY ANGIOGRAPHY;  Surgeon: Nigel Mormon, MD;  Location: [facility];  Service: Cardiovascular;  Laterality: N/A;    Family History  Problem Relation Age of Onset   Diabetes Mother    Heart disease Mother    Prostate cancer Father    Hypertension Brother    Prostate cancer  Brother     Social History   Socioeconomic History   Marital status: Widowed    Spouse name: Not on file   Number of children: 2   Years of education: PHD   Highest education level: Not on file  Occupational History   Occupation: Retired PhD Pharmacist, hospital [employer]  Social Needs   Financial resource strain: Not on file   Food insecurity    Worry: Not on file    Inability: Not on file   Transportation needs    Medical: Not on file    Non-medical: Not on file  Tobacco Use   Smoking status: Never Smoker   Smokeless tobacco: Never Used  Substance and Sexual Activity   Alcohol use: No   Drug use: No   Sexual activity: Not on file  Lifestyle   Physical activity    Days per week: Not on file    Minutes per session: Not on file   Stress: Not on file  Relationships   Social connections    Talks on phone: Not on file    Gets together: Not on file    Attends religious service: Not on file    Active member of club  or organization: Not on file    Attends meetings of clubs or organizations: Not on file    Relationship status: Not on file   Intimate partner violence    Fear of current or ex partner: Not on file    Emotionally abused: Not on file    Physically abused: Not on file    Forced sexual activity: Not on file  Other Topics Concern   Not on file  Social History Narrative   Patient is widowed and lives with her two adopted children.   Patient is retired.   Patient has two children of her own and two adopted children.   Patient has a PHD.   Patient is right-handed.    Current Outpatient Medications  Medication Sig Dispense Refill   acetaminophen (TYLENOL) 325 MG tablet Take 650 mg by mouth every 6 (six) hours as needed for moderate pain or headache.      allopurinol (ZYLOPRIM) 100 MG tablet Take 50 mg by mouth daily.     amLODipine (NORVASC) 10 MG tablet Take 1 tablet by mouth every day (Patient taking differently: Take 10 mg by mouth daily. ) 30 tablet  11   aspirin EC 81 MG tablet Take 81 mg by mouth daily.     B-D INS SYR ULTRAFINE 1CC/30G 30G X 1/2" 1 ML MISC Inject 1 each into the skin 3 (three) times daily between meals.     calcitRIOL (ROCALTROL) 0.25 MCG capsule Take 0.25 mcg by mouth daily.      clopidogrel (PLAVIX) 75 MG tablet Take 1 tablet (75 mg total) by mouth daily. 90 tablet 1   folic acid (FOLVITE) 1 MG tablet Take 1 mg by mouth daily.     HUMALOG KWIKPEN 100 UNIT/ML KwikPen Inject 2-8 Units into the skin 3 (three) times daily before meals.      insulin glargine (LANTUS) 100 UNIT/ML injection Inject 16 Units into the skin at bedtime.      labetalol (NORMODYNE) 100 MG tablet Take 100 mg by mouth 2 (two) times daily.     leflunomide (ARAVA) 10 MG tablet Take 10 mg by mouth daily.     niacin (NIASPAN) 500 MG CR tablet Take 500 mg by mouth daily.      nitroGLYCERIN (NITROSTAT) 0.4 MG SL tablet PLACE 1 TABLET (0.4 MG TOTAL) UNDER THE TONGUE EVERY 5 MINUTES X 3 DOSES AS NEEDED FOR CHEST PAIN. (Patient taking differently: Place 0.4 mg under the tongue every 5 (five) minutes as needed for chest pain. ) 25 tablet 1   nortriptyline (PAMELOR) 10 MG capsule Take 10 mg by mouth daily.     ONE TOUCH ULTRA TEST test strip 1 each by Other route 3 (three) times daily between meals.     oxyCODONE-acetaminophen (PERCOCET) 5-325 MG tablet Take 1 tablet by mouth every 6 (six) hours as needed for severe pain. 12 tablet 0   polyvinyl alcohol (TEARS AGAIN) 1.4 % ophthalmic solution Place 1 drop into both eyes 3 (three) times daily as needed for dry eyes.     PRISTIQ 100 MG 24 hr tablet Take 100 mg by mouth daily.      rosuvastatin (CRESTOR) 20 MG tablet Take 20 mg by mouth daily.     torsemide (DEMADEX) 20 MG tablet Take 1 tablet (20 mg total) by mouth daily. May take additional dose in the afternoon for swelling, shortness of breath or weight gain. (Patient taking differently: Take 40 mg by mouth daily. May take additional dose in the  afternoon for swelling, shortness of breath or weight gain.) 180 tablet 0   No current facility-administered medications for this visit.     No Known Allergies    Review of Systems:   General:  decreased appetite, decreased energy, no weight gain, + weight loss, no fever  Cardiac:  no chest pain with exertion, no chest pain at rest, +SOB with low level exertion, occasional resting SOB, + PND, + orthopnea, no palpitations, no arrhythmia, no atrial fibrillation, occasional mild LE edema, no dizzy spells, no syncope  Respiratory:  + shortness of breath, no home oxygen, no productive cough, + chronic dry cough, no bronchitis, no wheezing, no hemoptysis, no asthma, no pain with inspiration or cough, no sleep apnea, no CPAP at night  GI:   no difficulty swallowing, + reflux, no frequent heartburn, no hiatal hernia, no abdominal pain, + constipation, no diarrhea, no hematochezia, no hematemesis, no melena  GU:   no dysuria,  no frequency, no urinary tract infection, no hematuria, no kidney stones, + kidney disease  Vascular:  no pain suggestive of claudication, no pain in feet, occasional leg cramps, no varicose veins, no DVT, no non-healing foot ulcer  Neuro:   + stroke, + TIA's, no seizures, no headaches, + temporary blindness one eye,  no slurred speech, + peripheral neuropathy, + chronic pain, + instability of gait, no memory/cognitive dysfunction  Musculoskeletal: + arthritis, no joint swelling, no myalgias, mild difficulty walking, slightly limited mobility   Skin:   no rash, no itching, no skin infections, no pressure sores or ulcerations  Psych:   + anxiety, no depression, no nervousness, + unusual recent stress  Eyes:   no blurry vision, + floaters, + recent vision changes, does not wears glasses or contacts  ENT:   no hearing loss, no loose or painful teeth, no dentures, last saw dentist July 2020  Hematologic:  + easy bruising, no abnormal bleeding, no clotting disorder, no frequent  epistaxis  Endocrine:  + diabetes, does check CBG's at home           Physical Exam:   BP (!) 154/65    Pulse 70    Temp (!) 97.5 F (36.4 C) (Skin)    Resp 20    Ht 5\' 2"  (1.575 m)    Wt 160 lb (72.6 kg)    SpO2 98%    BMI 29.26 kg/m   General:  Mildly obese AA female NAD   HEENT:  Unremarkable   Neck:   no JVD, no bruits, no adenopathy   Chest:   clear to auscultation, symmetrical breath sounds, no wheezes, no rhonchi   CV:   RRR, grade IV/VI crescendo/decrescendo murmur heard best at RSB,  no diastolic murmur  Abdomen:  soft, non-tender, no masses   Extremities:  warm, well-perfused, pulses diminished, no LE edema  Rectal/GU  Deferred  Neuro:   Grossly non-focal and symmetrical throughout  Skin:   Clean and dry, no rashes, no breakdown   Diagnostic Tests:    ECHOCARDIOGRAM REPORT       Patient Name:   [name] Date of Exam: 09/10/2018 Medical Rec #:  [id number]     Height:       62.0 in Accession #:    [id number]    Weight:       173.6 lb Date of Birth:  1940-02-02     BSA:          1.80 m Patient Age:    51 years  BP:           123/45 mmHg Patient Gender: F             HR:           65 bpm. Exam Location:  Inpatient    Procedure: 2D Echo, Cardiac Doppler and Color Doppler  Indications:    Stroke.   History:        Patient has prior history of Echocardiogram examinations. CAD                 Stroke Aortic Valve Disease Signs/Symptoms: Murmur and Chest                 Pain Risk Factors: Hypertension, Dyslipidemia and Diabetes.   Sonographer:    Roseanna Rainbow RDCS Referring Phys: [place]    1. The left ventricle has normal systolic function with an ejection fraction of 60-65%. The cavity size was normal. There is severe asymmetric left ventricular hypertrophy with mild concentric LV hypertrophy.  2. The right ventricle has normal systolic function. The cavity was normal. There is no increase in right ventricular wall  thickness. Right ventricular systolic pressure is moderately elevated.  3. Left atrial size was mildly dilated.  4. The aortic valve is tricuspid. Moderate thickening of the aortic valve. Severe calcifcation of the aortic valve. Aortic valve regurgitation is moderate by color flow Doppler. Severe stenosis of the aortic valve. Moderate aortic annular calcification  noted. AV Vmax: 379.20 cm/s. AV Mean Grad: 56mmHg . AV Area (VTI): 0.42 cm. LVOT/AV VTI ratio: 0.23.  5. The aorta is normal unless otherwise noted.  6. The inferior vena cava was dilated in size with >50% respiratory variability.  FINDINGS  Left Ventricle: The left ventricle has normal systolic function, with an ejection fraction of 60-65%. The cavity size was normal. There is severe asymmetric left ventricular hypertrophy. Left ventricular diastolic Doppler parameters are consistent with  pseudonormalization. Elevated left ventricular end-diastolic pressure No evidence of left ventricular regional wall motion abnormalities..  Right Ventricle: The right ventricle has normal systolic function. The cavity was normal. There is no increase in right ventricular wall thickness. Right ventricular systolic pressure is moderately elevated.  Left Atrium: Left atrial size was mildly dilated.  Right Atrium: Right atrial size was normal in size.  Interatrial Septum: No atrial level shunt detected by color flow Doppler.  Pericardium: There is no evidence of pericardial effusion.  Mitral Valve: The mitral valve is normal in structure. Mitral valve regurgitation is mild by color flow Doppler.  Tricuspid Valve: The tricuspid valve is normal in structure. Tricuspid valve regurgitation is mild by color flow Doppler.  Aortic Valve: The aortic valve is tricuspid Moderate thickening of the aortic valve. Severe calcifcation of the aortic valve. Aortic valve regurgitation is moderate by color flow Doppler. There is Severe stenosis of the aortic  valve, with a calculated  valve area of 0.42 cm. Moderate aortic annular calcification noted.  Pulmonic Valve: The pulmonic valve was normal in structure. Pulmonic valve regurgitation is trivial by color flow Doppler.  Aorta: The aorta is normal unless otherwise noted.  Venous: The inferior vena cava measures 1.95 cm, is dilated in size with greater than 50% respiratory variability.    +--------------+--------++  LEFT VENTRICLE                 +----------------+---------++ +--------------+--------++       Diastology  PLAX 2D                        +----------------+---------++ +--------------+--------++       LV e' lateral:   5.96 cm/s    LVIDd:         3.65 cm         +----------------+---------++ +--------------+--------++       LV E/e' lateral: 14.5         LVIDs:         2.91 cm         +----------------+---------++ +--------------+--------++       LV e' medial:    4.29 cm/s    LV PW:         1.15 cm         +----------------+---------++ +--------------+--------++       LV E/e' medial:  20.2         LV IVS:        1.52 cm         +----------------+---------++ +--------------+--------++  LVOT diam:     1.55 cm    +--------------+--------++  LV SV:         24 ml      +--------------+--------++  LV SV Index:   12.60      +--------------+--------++  LVOT Area:     1.89 cm   +--------------+--------++                            +--------------+--------++   +------------------+---------++  LV Volumes (MOD)               +------------------+---------++  LV area d, A2C:    30.10 cm   +------------------+---------++  LV area d, A4C:    31.10 cm   +------------------+---------++  LV area s, A2C:    18.20 cm   +------------------+---------++  LV area s, A4C:    23.10 cm   +------------------+---------++  LV major d, A2C:   7.47 cm     +------------------+---------++  LV major d, A4C:   8.22 cm     +------------------+---------++  LV major s, A2C:    6.30 cm     +------------------+---------++  LV major s, A4C:   7.26 cm     +------------------+---------++  LV vol d, MOD A2C: 103.0 ml    +------------------+---------++  LV vol d, MOD A4C: 97.2 ml     +------------------+---------++  LV vol s, MOD A2C: 44.1 ml     +------------------+---------++  LV vol s, MOD A4C: 61.3 ml     +------------------+---------++  LV SV MOD A2C:     58.9 ml     +------------------+---------++  LV SV MOD A4C:     97.2 ml     +------------------+---------++  LV SV MOD BP:      49.3 ml     +------------------+---------++  +---------------+----------++  RIGHT VENTRICLE              +---------------+----------++  RV S prime:     10.10 cm/s   +---------------+----------++  TAPSE (M-mode): 1.6 cm       +---------------+----------++  RVSP:           55.1 mmHg    +---------------+----------++  +---------------+-------++-----------++  LEFT ATRIUM              Index         +---------------+-------++-----------++  LA diam:        4.20 cm  2.33 cm/m    +---------------+-------++-----------++  LA Vol (A2C):   53.8 ml  29.89 ml/m   +---------------+-------++-----------++  LA Vol (A4C):   57.2 ml  31.78 ml/m   +---------------+-------++-----------++  LA Biplane Vol: 55.7 ml  30.94 ml/m   +---------------+-------++-----------++ +------------+---------++-----------++  RIGHT ATRIUM            Index         +------------+---------++-----------++  RA Pressure: 8.00 mmHg                +------------+---------++-----------++  RA Area:     12.20 cm                +------------+---------++-----------++  RA Volume:   25.00 ml   13.89 ml/m   +------------+---------++-----------++  +------------------+------------++ +-----------------+---------++  AORTIC VALVE                       PULMONIC VALVE                +------------------+------------++ +-----------------+---------++  AV Area (Vmax):    0.42 cm        PR End Diast Vel: 1.88  msec   +------------------+------------++ +-----------------+---------++  AV Area (Vmean):   0.38 cm       +------------------+------------++  AV Area (VTI):     0.42 cm       +------------------+------------++  AV Vmax:           379.20 cm/s    +------------------+------------++  AV Vmean:          278.600 cm/s   +------------------+------------++  AV VTI:            0.960 m        +------------------+------------++  AV Peak Grad:      54mmHg         +------------------+------------++  AV Mean Grad:      96mmHg         +------------------+------------++  LVOT Vmax:         84.90 cm/s     +------------------+------------++  LVOT Vmean:        55.800 cm/s    +------------------+------------++  LVOT VTI:          0.216 m        +------------------+------------++  LVOT/AV VTI ratio: 0.23           +------------------+------------++  AR PHT:            227 msec       +------------------+------------++   +-------------+-------++  AORTA                   +-------------+-------++  Ao Root diam: 2.50 cm   +-------------+-------++  Ao Asc diam:  3.30 cm   +-------------+-------++  +--------------+----------++ +---------------+-----------++  MITRAL VALVE                 TRICUSPID VALVE               +--------------+----------++ +---------------+-----------++  MV Area (PHT): 5.02 cm      TR Peak grad:   47.1 mmHg     +--------------+----------++ +---------------+-----------++  MV PHT:        43.79 msec    TR Vmax:        343.00 cm/s   +--------------+----------++ +---------------+-----------++  MV Decel Time: 151 msec      Estimated RAP:  8.00 mmHg     +--------------+----------++ +---------------+-----------++ +--------------+----------++  RVSP:  55.1 mmHg      MV E velocity: 86.55 cm/s   +---------------+-----------++ +--------------+----------++  MV A velocity: 67.90 cm/s   +--------------+-------+ +--------------+----------++  SHUNTS                   MV E/A  ratio:  1.27         +--------------+-------+ +--------------+----------++  Systemic VTI:  0.22 m                                +--------------+-------+                               Systemic Diam: 1.55 cm                               +--------------+-------+  +---------+-------+  IVC                +---------+-------+  IVC diam: 1.95 cm  +---------+-------+    Fransico Him MD Electronically signed by Fransico Him MD Signature Date/Time: 09/10/2018/12:56:15 PM     RIGHT/LEFT HEART CATH AND CORONARY ANGIOGRAPHY  Conclusion  RA: 4 mmHg RV: 54/9 mmHg PA: 54/17 mmHg, mean PAP 34 mmhg. PCWP: 25 mmHg with tall V wave  CO: 5.2 L/min CI: 3 L/min/m2  LM: Normal LAD: Normal LCx:: Normal RCA: Ostial calcific at least 60% stenosis. Ostial RPDA 20% stenosis.   Right coronary artery was difficult to engage. I had to use multiple catheters, and was finally able to engage with [facility] catheter. there was dampnening of the pressure with catheter engagement. Ostium is at least 60% stenosed with moderate calcification. There was no signifciant improvement with IC NTG. I did not perform FFR in order to limit contrast use in this patient with Cr of 3.0, not on dialysis.   Recommendation: Will discuss with structural valve clinic. If functional evaluation of RCA is felt necesary prior to TAVR/SAVR, would consider non-invasive stress test.   Nigel Mormon, MD [facility] Pager: [phone] Office: [phone] If no answer Cell [phone]    Recommendations  Antiplatelet/Anticoag Recommend Aspirin 81mg  daily for moderate CAD. Structural valve clinic referral  Indications  Severe aortic stenosis [I35.0 (ICD-10-CM)]  Procedural Details  Technical Details Procedures: 1. Ultrasound guided right common femoral artery access 2. Right heart catheterization 3. Selective left and right coronary angiography 4. Conscious sedation monitoring 68  min  Indication: Severe aortic stenosis  History: 78 y.o.African American femalewith hypertension, hyperlipidemia, advanced CKD, h/o CVA/TIA, new diagnosis of severe aortic stenosis, CAD s/p dRCA PCI 2015, type 2 DM,   Right common femoral artery visualized under ultrasound guidance. Vessel and flow identified. Image placed on the chart. Successful ultrasound guided right common artery access.   Diagnostic Angiography: Catheter/s advances over guidewire under fluoroscopy Left coronary artery: 5 Fr JL 4  Right coronary artery: 5 3DRC   Pressures tracings obtained in right atrium, right ventricle, pulmonary artery, and pulmonary capillary wedge position.   Anticoagulation:  None  Hemostasis: Manual compression  Total contrast used: 50 cc   Total fluoro time: 21.6 min Air Kerma: 1.8 Gy  All wires and catheters removed out of the body at the end of the procedure Final angiogram showed no dissection/perforation         Estimated blood loss <50 mL.   During this procedure  medications were administered to achieve and maintain moderate conscious sedation while the patient's heart rate, blood pressure, and oxygen saturation were continuously monitored and I was present face-to-face 100% of this time.  Medications (Filter: Administrations occurring from 10/23/18 1340 to 10/23/18 1538) (important)  Continuous medications are totaled by the amount administered until 10/23/18 1538.  Medication Rate/Dose/Volume Action  Date Time   Heparin (Porcine) in NaCl 1000-0.9 UT/500ML-% SOLN (mL) 500 mL Given 10/23/18 1348   Total dose as of 10/23/18 1538        500 mL        Heparin (Porcine) in NaCl 1000-0.9 UT/500ML-% SOLN (mL) 500 mL Given 10/23/18 1348   Total dose as of 10/23/18 1538        500 mL        lidocaine (PF) (XYLOCAINE) 1 % injection (mL) 2 mL Given 10/23/18 1403   Total dose as of 10/23/18 1538 2 mL Given 1403   4 mL        fentaNYL (SUBLIMAZE) injection (mcg) 25  mcg Given 10/23/18 1411   Total dose as of 10/23/18 1538        25 mcg        midazolam (VERSED) injection (mg) 1 mg Given 10/23/18 1411   Total dose as of 10/23/18 1538        1 mg        nitroGLYCERIN 1 mg/10 mL (100 mcg/mL) - IR/CATH LAB (mcg) 200 mcg Given 10/23/18 1509   Total dose as of 10/23/18 1538        200 mcg        iohexol (OMNIPAQUE) 350 MG/ML injection (mL) 50 mL Given 10/23/18 1525   Total dose as of 10/23/18 1538        50 mL        labetalol (NORMODYNE) injection (mg) 10 mg Given 10/23/18 1517   Total dose as of 10/23/18 1538        10 mg        Sedation Time  Sedation Time Physician-1: 1 hour 8 minutes 4 seconds  Contrast  Medication Name Total Dose  iohexol (OMNIPAQUE) 350 MG/ML injection 50 mL    Radiation/Fluoro  Fluoro time: 21.6 (min) DAP: [id number] (cGycm2) Cumulative Air Kerma: [id number] (mGy)  Complications  Complications documented before study signed (10/23/2018 5:37 PM)   RIGHT/LEFT HEART CATH AND CORONARY ANGIOGRAPHY  None Documented by Nigel Mormon, MD 10/23/2018 5:35 PM  Date Found: 10/23/2018  Time Range: Intraprocedure      Coronary Findings  Diagnostic Dominance: Right Left Main  Vessel is normal in caliber. Vessel is angiographically normal.  Left Anterior Descending  Vessel is normal in caliber. Vessel is angiographically normal.  Left Circumflex  Vessel is normal in caliber. Vessel is angiographically normal.  Right Coronary Artery  Ost RCA lesion 60% stenosed  Ost RCA lesion is 60% stenosed. The lesion is moderately calcified. Right coronary artery was difficult to engage. I had to use multiple catheters, and was finally able to engage with [facility] catheter. there was dampnening of the pressure with catheter engagement. Ostium is at least 60% stenosed with moderate calcification. There was no signifciant improvement with IC NTG. I did not perform FFR in order to limit contrast use in this patient with Cr of 3.0, not on dialysis.  Will discuss with structural valve clinic. If functional evaluation of RCA is felt necesary prior to TAVR/SAVR, would consider non-invasive stress test.  Right Posterior Descending Artery  RPDA lesion 20% stenosed  RPDA lesion is 20% stenosed.  Intervention  No interventions have been documented. Right Heart  Right Heart Pressures RA: 4 mmHg RV: 54/9 mmHg PA: 54/17 mmHg, mean PAP 34 mmhg. PCWP: 25 mmHg with tall V wave  CO: 5.2 L/min CI: 3 L/min/m2  Left Heart  Left Ventricle LV was not entered  Coronary Diagrams  Diagnostic Dominance: Right  Intervention  Implants   No implant documentation for this case.  Syngo Images  Show images for CARDIAC CATHETERIZATION  Images on Long Term Storage  Show images for Mervin, ARLY BLEE to Procedure Log  Procedure Log    Hemo Data   Most Recent Value  Fick Cardiac Output 5.2 L/min  Fick Cardiac Output Index 3.02 (L/min)/BSA  RA A Wave 8 mmHg  RA V Wave 5 mmHg  RA Mean 4 mmHg  RV Systolic Pressure 54 mmHg  RV Diastolic Pressure 1 mmHg  RV EDP 9 mmHg  PA Systolic Pressure 54 mmHg  PA Diastolic Pressure 17 mmHg  PA Mean 34 mmHg  PW A Wave 26 mmHg  PW V Wave 38 mmHg  PW Mean 25 mmHg  AO Systolic Pressure 0000000 mmHg  AO Diastolic Pressure 48 mmHg  AO Mean 98 mmHg  QP/QS 1  TPVR Index 11.26 HRUI  TSVR Index 29.15 HRUI  PVR SVR Ratio 0.11  TPVR/TSVR Ratio 0.39      ADDENDUM: Correction: Annular measurements of the aortic valve borderline between 20 and 23 mm Edwards-SAPIEN 3 Ultra valve (not between 23 and 26 mm as previously stated).   Electronically Signed   By: Ena Dawley   On: 11/08/2018 12:25   Addended by Dorothy Spark, MD on 11/08/2018 12:27 PM    ADDENDUM REPORT: 11/01/2018 23:56  CLINICAL DATA:  78 year old female with severe aortic stenosis being evaluated for a TAVR procedure.  EXAM: Cardiac TAVR CT  TECHNIQUE: The patient was scanned on a [employer]. A  120 kV retrospective scan was triggered in the descending thoracic aorta at 111 HU's. Gantry rotation speed was 250 msecs and collimation was .6 mm. No beta blockade or nitro were given. The 3D data set was reconstructed in 5% intervals of the R-R cycle. Systolic and diastolic phases were analyzed on a dedicated work station using MPR, MIP and VRT modes. The patient received 80 cc of contrast.  FINDINGS: Aortic Valve: Trileaflet aortic valve with severely calcified leaflets with severely restricted leaflets opening and no calcifications extending into the LVOT.  Aorta: Normal size with mild diffuse atherosclerotic plaque and calcifications and no dissection.  Sinotubular Junction: 26 x 24 mm  Ascending Thoracic Aorta: 37 x 36 mm  Aortic Arch: 24 x 23 mm  Descending Thoracic Aorta: 22 x 21 mm  Sinus of Valsalva Measurements:  Non-coronary: 27 mm  Right -coronary: 26 mm  Left -coronary: 28 mm  Coronary Artery Height above Annulus:  Left Main: 14 mm  Right Coronary: 15 mm  Virtual Basal Annulus Measurements:  Maximum/Minimum Diameter: 24.0 x 19.6 mm  Mean Diameter: 20.9 mm  Perimeter: 66.9 mm  Area: 343 mm2  Optimum Fluoroscopic Angle for Delivery: LAO 18 CAU 21  Dilated pulmonary artery measuring 35 mm suggestive of pulmonary hypertension.  IMPRESSION: 1. Trileaflet aortic valve with severely calcified leaflets with severely restricted leaflets opening and no calcifications extending into the LVOT. Aortic valve calcium score 2413 consistent with severe aortic stenosis. Annular measurements borderline between 23 and 26 mm valves, but  would favor 26 mm Edwards-SAPIEN 3 valve.  2. Sufficient coronary to annulus distance.  3. Optimum Fluoroscopic Angle for Delivery:  LAO 18 CAU 21.  4. No thrombus in the left atrial appendage.   Electronically Signed   By: Ena Dawley   On: 11/01/2018 23:56      CT ANGIOGRAPHY CHEST,  ABDOMEN AND PELVIS  TECHNIQUE: Multidetector CT imaging through the chest, abdomen and pelvis was performed using the standard protocol during bolus administration of intravenous contrast. Multiplanar reconstructed images and MIPs were obtained and reviewed to evaluate the vascular anatomy.  CONTRAST:  27mL OMNIPAQUE IOHEXOL 350 MG/ML SOLN  COMPARISON:  None.  FINDINGS: CTA CHEST FINDINGS  Cardiovascular: Heart size is mildly enlarged. Pericardial thickening and calcification overlying the free wall the right ventricle where there is focal distortion of the cardiac contour, which may suggest underlying constriction. There is aortic atherosclerosis, as well as atherosclerosis of the great vessels of the mediastinum and the coronary arteries, including calcified atherosclerotic plaque in the left anterior descending, left circumflex and right coronary arteries. Severe thickening calcification of the aortic valve.  Mediastinum/Lymph Nodes: No pathologically enlarged mediastinal or hilar lymph nodes. Esophagus is unremarkable in appearance. No axillary lymphadenopathy.  Lungs/Pleura: Trace right pleural effusion lying dependently. No acute consolidative airspace disease. No suspicious appearing pulmonary nodules or masses are noted.  Musculoskeletal/Soft Tissues: There are no aggressive appearing lytic or blastic lesions noted in the visualized portions of the skeleton.  CTA ABDOMEN AND PELVIS FINDINGS  Hepatobiliary: No suspicious cystic or solid hepatic lesions. No intra or extrahepatic biliary ductal dilatation. Gallbladder is normal in appearance.  Pancreas: No pancreatic mass. No pancreatic ductal dilatation. No pancreatic or peripancreatic fluid collections or inflammatory changes.  Spleen: Unremarkable.  Adrenals/Urinary Tract: In the lower pole of the left kidney there is a somewhat ill-defined lesion which appears to have internal septations and  low level enhancement estimated to measure approximately 4.1 x 3.0 x 2.7 cm (axial image 317 of series 6 and coronal image 73 of series 14), considered indeterminate. Other low-attenuation lesions in both kidneys, compatible with simple cysts, largest of which measures 2.9 cm in the posterior aspect of the interpolar region of the left kidney. Several subcentimeter low-attenuation lesions in both kidneys, too small to characterize. No hydroureteronephrosis. Urinary bladder is unremarkable in appearance. Bilateral adrenal glands are normal in appearance.  Stomach/Bowel: Normal appearance of the stomach. No pathologic dilatation of small bowel or colon. Normal appendix.  Vascular/Lymphatic: Aortic atherosclerosis, with vascular findings and measurements pertinent to potential TAVR procedure, as detailed below. No aneurysm or dissection noted in the abdominal or pelvic vasculature. No lymphadenopathy noted in the abdomen or pelvis.  Reproductive: Status post hysterectomy. Ovaries are not confidently identified may be surgically absent or atrophic.  Other: No significant volume of ascites.  No pneumoperitoneum.  Musculoskeletal: There are no aggressive appearing lytic or blastic lesions noted in the visualized portions of the skeleton.  VASCULAR MEASUREMENTS PERTINENT TO TAVR:  AORTA:  Minimal Aortic Diameter-9 x 9 mm  Severity of Aortic Calcification-severe  RIGHT PELVIS:  Right Common Iliac Artery -  Minimal Diameter-8.5 x 8.0 mm  Tortuosity-mild  Calcification-mild-to-moderate  Right External Iliac Artery -  Minimal Diameter-6.5 x 6.4 mm  Tortuosity-moderate  Calcification-none  Right Common Femoral Artery -  Minimal Diameter-6.2 x 5.8 mm  Tortuosity-mild  Calcification-mild  LEFT PELVIS:  Left Common Iliac Artery -  Minimal Diameter-8.5 x 7.6 mm  Tortuosity-mild  Calcification-mild-to-moderate  Left External Iliac  Artery -  Minimal Diameter-6.9 x 6.5 mm  Tortuosity-mild  Calcification-none  Left Common Femoral Artery -  Minimal Diameter-5.9 x 6.3 mm  Tortuosity-mild  Calcification-none  Review of the MIP images confirms the above findings.  IMPRESSION: 1. Vascular findings and measurements pertinent to potential TAVR procedure, as detailed above. 2. Severe thickening calcification of the aortic valve, compatible with the reported clinical history of severe aortic stenosis. 3. Pericardial thickening calcification overlying the free wall of the right ventricle immediately anterior to the atrioventricular groove where there is also focal distortion of the cardiac contour, which may suggest constrictive physiology. Correlation with echocardiography is suggested if clinically appropriate. 4. Aortic atherosclerosis, in addition to 3 vessel coronary artery disease. 5. Indeterminate lesion in the lower pole of the left kidney estimated to measure approximately 4.1 x 3.0 x 2.7 cm, suspicious for potential cystic renal neoplasm. Follow-up nonemergent definitive characterization of this lesion with MRI of the abdomen with and without IV gadolinium is strongly recommended in the near future. 6. Additional incidental findings, as above.   Electronically Signed   By: Vinnie Langton [name]   On: 11/01/2018 15:03    EKG: NSR w/out significant AV conduction delay, borderline non-specific ST changes    Impression:  Patient has stage D severe symptomatic aortic stenosis.  She describes a gradual decline with worsening symptoms of exertional shortness of breath, increasing fatigue, and recent occasional episodes of resting shortness of breath with orthopnea and PND, consistent with chronic diastolic congestive heart failure, New York Heart Association functional class IIIb.  I have personally reviewed the patient's recent transthoracic echocardiogram, diagnostic cardiac  catheterization, and CT angiograms.  Echocardiogram reveals normal left ventricular systolic function.  There is significant left ventricular hypertrophy with diastolic dysfunction.  The aortic valve is trileaflet with severe thickening, calcification, and restricted leaflet mobility involving all 3 leaflets of the aortic valve.  Peak velocity across aortic valve measured as high as 3.8 m/s corresponding to mean transvalvular gradient estimated 40 mmHg with aortic valve area calculated only 0.42 cm and DVI reported 0.23.  The severity of aortic stenosis is further exacerbated by the presence of relatively small diameter aortic root.  Diagnostic cardiac catheterization reveals 60% ostial stenosis of the right coronary artery but otherwise mild nonobstructive coronary artery disease.  There was moderate pulmonary hypertension.  The patient does not have symptoms suspicious for angina pectoris.  I agree the patient would benefit from elective aortic valve replacement.  Risks associated with conventional surgery would be very high because of the patient's advanced age, numerous comorbid medical problems, and the presence of relatively small size aortic root which would likely mandate a need for root enlargement or root replacement.  I would be very reluctant to consider this patient a candidate for conventional surgery.  Cardiac-gated CTA of the heart reveals anatomical characteristics consistent with aortic stenosis suitable for treatment by transcatheter aortic valve replacement without any significant complicating features and CTA of the aorta and iliac vessels demonstrate what appears to be adequate pelvic vascular access to facilitate a transfemoral approach.  Most recent twelve-lead EKG reveals normal sinus rhythm with no significant AV conduction delay.    Plan:  The patient and her brother counseled at length regarding treatment alternatives for management of severe symptomatic aortic stenosis.  Alternative approaches such as conventional aortic valve replacement, transcatheter aortic valve replacement, and continued medical therapy without intervention were compared and contrasted at length.  The risks associated with conventional surgical aortic valve replacement were discussed in detail,  as were expectations for post-operative convalescence, and why I would be reluctant to consider this patient a candidate for conventional surgery.  Issues specific to transcatheter aortic valve replacement were discussed including questions about long term valve durability, the potential for paravalvular leak, possible increased risk of need for permanent pacemaker placement, and other technical complications related to the procedure itself.  Long-term prognosis with medical therapy was discussed. This discussion was placed in the context of the patient's own specific clinical presentation and past medical history.  All of their questions have been addressed.  The patient desires to proceed with transcatheter aortic valve replacement in the near future.  We tentatively plan to proceed with surgery on November 13, 2018 presuming that follow-up preoperative blood work reveals stable or improved renal function.  Following the decision to proceed with transcatheter aortic valve replacement, a discussion has been held regarding what types of management strategies would be attempted intraoperatively in the event of life-threatening complications, including whether or not the patient would be considered a candidate for the use of cardiopulmonary bypass and/or conversion to open sternotomy for attempted surgical intervention.  The patient has been advised of a variety of complications that might develop including but not limited to risks of death, stroke, paravalvular leak, aortic dissection or other major vascular complications, aortic annulus rupture, device embolization, cardiac rupture or perforation, mitral regurgitation,  acute myocardial infarction, arrhythmia, heart block or bradycardia requiring permanent pacemaker placement, congestive heart failure, respiratory failure, renal failure, pneumonia, infection, other late complications related to structural valve deterioration or migration, or other complications that might ultimately cause a temporary or permanent loss of functional independence or other long term morbidity.  The patient provides full informed consent for the procedure as described and all questions were answered.   I spent in excess of 90 minutes during the conduct of this office consultation and >50% of this time involved direct face-to-face encounter with the patient for counseling and/or coordination of their care.     Valentina Gu. [name], MD 11/08/2018 11:59 AM

## 2018-11-08 NOTE — Pre-Procedure Instructions (Addendum)
[id number] [address] [place] [address] Phone: [phone] Fax: [phone]  [place], [address] [place] [address] Phone: [phone] Fax: [phone]  [facility] [place], [place] [address] [place] [address] Phone: [phone] Fax: [phone]      Your procedure is scheduled on 11-13-18  Report to [facility] Main Entrance "A" at 0530 A.M., and check in at the Admitting office.  Call this number if you have problems the morning of surgery:  [phone]  Call [phone] if you have any questions prior to your surgery date Monday-Friday 8am-4pm    Remember:  Do not eat or drink after midnight the night before your surgery  DO NOT TAKE ANY MEDICATIONS THE MORNING OF SURGERY.  7 days prior to surgery STOP taking any Aspirin (unless otherwise instructed by your surgeon), Aleve, Naproxen, Ibuprofen, Motrin, Advil, Goody's, BC's, all herbal medications, fish oil, and all vitamins.   WHAT DO I DO ABOUT MY DIABETES MEDICATION?   . THE NIGHT BEFORE SURGERY, do not take Lantus at bed time., do not take bed time dose of Humalog. . THE DAY OF SURGERY-      . If your CBG is greater than 220 mg/dL, you may take  of your sliding scale Humalog (correction) dose of insulin.   How to Manage Your Diabetes Before and After Surgery  Why is it important to control my blood sugar before and after surgery? . Improving blood sugar levels before and after surgery helps healing and can limit problems. . A way of improving blood sugar control is eating a healthy diet by: o  Eating less sugar and carbohydrates o  Increasing activity/exercise o  Talking with your doctor about reaching your blood sugar goals . High blood sugars (greater than 180 mg/dL) can raise your risk of infections and slow your recovery, so you will  need to focus on controlling your diabetes during the weeks before surgery. . Make sure that the doctor who takes care of your diabetes knows about your planned surgery including the date and location.  How do I manage my blood sugar before surgery? . Check your blood sugar at least 4 times a day, starting 2 days before surgery, to make sure that the level is not too high or low. o Check your blood sugar the morning of your surgery when you wake up and every 2 hours until you get to the Short Stay unit. . If your blood sugar is less than 70 mg/dL, you will need to treat for low blood sugar: o Do not take insulin. o Treat a low blood sugar (less than 70 mg/dL) with  cup of clear juice (cranberry or apple), 4 glucose tablets, OR glucose gel. o Recheck blood sugar in 15 minutes after treatment (to make sure it is greater than 70 mg/dL). If your blood sugar is not greater than 70 mg/dL on recheck, call [phone] for further instructions. . Report your blood sugar to the short stay nurse when you get to Short Stay.  . If you are admitted to the hospital after surgery: o Your blood sugar will be checked by the staff and you will probably be given insulin after surgery (instead of oral diabetes medicines) to make sure you have good blood sugar levels. o The goal for blood sugar control after surgery is 80-180 mg/dL.  The Morning of Surgery  Do not wear jewelry, make-up or nail polish.  Do not wear lotions, powders, or perfumes/colognes, or deodorant  Do not shave 48 hours prior to surgery.  Do not bring valuables to the hospital.  [facility] is not responsible for any belongings or valuables.  If you are a smoker, DO NOT Smoke 24 hours prior to surgery IF you wear a CPAP at night please bring your mask, tubing, and machine the morning of surgery   Remember that you must have someone to transport you home after your surgery, and remain with you for 24 hours if you are discharged the same  day.   Contacts, glasses, hearing aids, dentures or bridgework may not be worn into surgery.    Leave your suitcase in the car.  After surgery it may be brought to your room.  For patients admitted to the hospital, discharge time will be determined by your treatment team.  Patients discharged the day of surgery will not be allowed to drive home.    Special instructions:   [place]- Preparing For Surgery  Before surgery, you can play an important role. Because skin is not sterile, your skin needs to be as free of germs as possible. You can reduce the number of germs on your skin by washing with CHG (chlorahexidine gluconate) Soap before surgery.  CHG is an antiseptic cleaner which kills germs and bonds with the skin to continue killing germs even after washing.    Oral Hygiene is also important to reduce your risk of infection.  Remember - BRUSH YOUR TEETH THE MORNING OF SURGERY WITH YOUR REGULAR TOOTHPASTE  Please do not use if you have an allergy to CHG or antibacterial soaps. If your skin becomes reddened/irritated stop using the CHG.  Do not shave (including legs and underarms) for at least 48 hours prior to first CHG shower. It is OK to shave your face.  Please follow these instructions carefully.   1. Shower the NIGHT BEFORE SURGERY and the MORNING OF SURGERY with CHG Soap.   2. If you chose to wash your hair, wash your hair first as usual with your normal shampoo.  3. After you shampoo, rinse your hair and body thoroughly to remove the shampoo.  4. Use CHG as you would any other liquid soap. You can apply CHG directly to the skin and wash gently with a scrungie or a clean washcloth.   5. Apply the CHG Soap to your body ONLY FROM THE NECK DOWN.  Do not use on open wounds or open sores. Avoid contact with your eyes, ears, mouth and genitals (private parts). Wash Face and genitals (private parts)  with your normal soap.   6. Wash thoroughly, paying special attention to the  area where your surgery will be performed.  7. Thoroughly rinse your body with warm water from the neck down.  8. DO NOT shower/wash with your normal soap after using and rinsing off the CHG Soap.  9. Pat yourself dry with a CLEAN TOWEL.  10. Wear CLEAN PAJAMAS to bed the night before surgery, wear comfortable clothes the morning of surgery  11. Place CLEAN SHEETS on your bed the night of your first shower and DO NOT SLEEP WITH PETS.    Day of Surgery:  Do not apply any deodorants/lotions. Please shower the morning of surgery with the CHG soap  Please wear clean clothes to the hospital/surgery center.   Remember to brush your teeth WITH YOUR REGULAR  TOOTHPASTE.   Please read over the following fact sheets that you were given.

## 2018-11-08 NOTE — Patient Instructions (Signed)
   Continue taking all current medications without change through the day before surgery.  Have nothing to eat or drink after midnight the night before surgery.  On the morning of surgery take only labetalol and amlodipine with a sip of water.

## 2018-11-08 NOTE — Therapy (Signed)
[facility] [address] [place], [place], [address] Phone: [phone]   Fax:  [phone]  Occupational Therapy Evaluation  Patient Details  Name: [name] MRN: [id number] Date of Birth: 24-Jan-1940 No data recorded  Encounter Date: 11/08/2018  OT End of Session - 11/08/18 1504    Visit Number  1    Number of Visits  17    Date for OT Re-Evaluation  01/08/19    Authorization Type  UHC MCR, [place] - Visit Number  1    Authorization - Number of Visits  10    OT Start Time  1017    OT Stop Time  1057    OT Time Calculation (min)  40 min    Activity Tolerance  Patient tolerated treatment well    Behavior During Therapy  [facility] for tasks assessed/performed       Past Medical History:  Diagnosis Date  . Anginal pain ([place])   . Arthritis   . Cerebrovascular disease   . CKD (chronic kidney disease)    Sees Dr Deterding  . Coronary artery disease   . Depression   . Diabetes ([place])    INSULIN DEPENDENT  . Diabetic peripheral neuropathy ([place])   . Diabetic retinopathy ([place])   . Diverticulitis   . Dyslipidemia   . GERD (gastroesophageal reflux disease)   . Headache(784.0) 01/30/2013  . Heart murmur   . History of cerebrovascular accident 01/30/2013  . Hyperlipidemia   . Hypertension   . Migraine   . Obesity   . Stroke ([place])    x 2 no residulal  . TIA (transient ischemic attack)    " MULTIPLE PER PATIENT "; had left sided weakness- rehab and wants without assistance    Past Surgical History:  Procedure Laterality Date  . ABDOMINAL HYSTERECTOMY    . AV FISTULA PLACEMENT Left 12/18/2017   Procedure: ARTERIOVENOUS (AV) FISTULA CREATION ARM;  Surgeon: Marty Heck, MD;  Location: [place];  Service: Vascular;  Laterality: Left;  . bilateral cataract surgery    . CARDIAC CATHETERIZATION  01/07/2014   DR [place] RESECTION  02/1999  . COLONOSCOPY    . COLOSTOMY  02/1999  .  COLOSTOMY CLOSURE  07/1999  . EYE SURGERY Left 2019  . FISTULA SUPERFICIALIZATION Left 06/29/2018   Procedure: FISTULA SUPERFICIALIZATION LEFT BRACHIOCEPHALIC;  Surgeon: Marty Heck, MD;  Location: [place];  Service: Vascular;  Laterality: Left;  . LEFT HEART CATHETERIZATION WITH CORONARY ANGIOGRAM N/A 01/07/2014   Procedure: LEFT HEART CATHETERIZATION WITH CORONARY ANGIOGRAM;  Surgeon: Laverda Page, MD;  Location: [facility] CATH LAB;  Service: Cardiovascular;  Laterality: N/A;  . middle cerebral artery stent placement Right   . OTHER SURGICAL HISTORY     laser surgery  . PTCA  01/07/2014   DES to RCA    DR Einar Gip  . right knee surgery Right    for infection  . RIGHT/LEFT HEART CATH AND CORONARY ANGIOGRAPHY N/A 10/23/2018   Procedure: RIGHT/LEFT HEART CATH AND CORONARY ANGIOGRAPHY;  Surgeon: Nigel Mormon, MD;  Location: [place] [facility] CV LAB;  Service: Cardiovascular;  Laterality: N/A;    There were no vitals filed for this visit.  Subjective Assessment - 11/08/18 1020    Patient is accompanied by:  Family member   BROTHER   Pertinent History  CVA (Rt PCA) 09/09/18. PMH: Severe aortic stenosis, CAD, CKD, CVA 2015 which pt reports she made a  full recovery except some fine motor skills Lt hand    Limitations  no driving, fall risk    Currently in Pain?  No/denies        [facility] OT Assessment - 11/08/18 0001      Assessment   Medical Diagnosis  CVA (Rt PCA) w/ Lt hemiparesis    Onset Date/Surgical Date  09/09/18    Hand Dominance  Right    Prior Therapy  acute care      Precautions   Precautions  Fall    Precaution Comments  no driving      Balance Screen   Has the patient fallen in the past 6 months  No      Home  Environment   Bathroom Shower/Tub  Walk-in Shower;Curtain   grab bars   Additional Comments  Pt currently has friend staying with her. Pt lives in 2 story home with 2 steps to enter. DME: Built in shower seat    Lives With  Alone      Prior Function    Level of Independence  Independent   Pt has mild residual Lt FMC deficits from CVA in 2015   Vocation  Retired    Leisure  volunteers at [employer]      ADL   Eating/Feeding  Needs assist with cutting food    Grooming  Modified independent    Upper Body Bathing  Modified independent    Lower Body Bathing  Modified independent    Upper Body Dressing  Increased time   sometimes need help w/ buttons (from first stroke)    Lower Body Dressing  Increased time    Leisure centre manager  Minimal assistance   occasionally for safety     IADL   Shopping  Completely unable to shop   family doing   Light Housekeeping  --   folds some clothes but dependent for all other tasks   Meal Prep  Able to complete simple cold meal and snack prep    Community Mobility  Relies on family or friends for transportation    Medication Management  Takes responsibility if medication is prepared in advance in seperate dosage;Has difficulty remembering to take medication    Financial Management  --   pt has automatic withdrawls     Mobility   Mobility Status  Independent      Written Expression   Dominant Hand  Right      Vision - History   Additional Comments  Pt reports difficulty seeing out of Lt eye prior to recent stroke, but this stroke made it worse. Pt also reports decreased vision Rt eye from this stroke      Vision Assessment   Eye Alignment  Within Functional Limits    Ocular Range of Motion  Within Functional Limits    Tracking/Visual Pursuits  Able to track stimulus in all quads without difficulty    Visual Fields  Left homonymous Hemianopsia   fairly significant    Comment  Denies diplopia      Cognition   Overall Cognitive Status  Cognition to be further assessed in functional context PRN    Cognition Comments  Pt denies changes       Observation/Other Assessments   Observations  Pt appears to have some trouble with providing deficits before this stroke as  opposed to first stroke (poor historian)      Sensation   Light Touch  Impaired by gross assessment  Additional Comments  Pt can detect light touch and localization Lt hand with single stimulus, however 2 pt discrimination (w/ stimulus on Rt hand as well) but could only detect Rt hand, not Lt hand      Coordination   Barella Nose Carducci Test  Impaired - hypometria LUE    9 Hole Peg Test  Right;Left    Right 9 Hole Peg Test  30.53 sec    Left 9 Hole Peg Test  40.28 sec      Edema   Edema  none      ROM / Strength   AROM / PROM / Strength  AROM      AROM   Overall AROM Comments  BUE AROM WNL's however noted decreased coordination LUE      Hand Function   Right Hand Grip (lbs)  48 lbs    Left Hand Grip (lbs)  31 lbs                        OT Short Term Goals - 11/08/18 1510      OT SHORT TERM GOAL #1   Title  Independent with initial HEP for Lt hand coordination and strength    Time  4    Period  Weeks    Status  New      OT SHORT TERM GOAL #2   Title  Pt to improve coordination Lt hand as evidenced by performing 9 hole peg test in 34 sec or less    Baseline  40.28 sec    Time  4    Period  Weeks    Status  New      OT SHORT TERM GOAL #3   Title  Pt to improve grip strength by 5 lbs or more Lt hand to assist in opening tight jars    Baseline  31 lbs    Time  4    Period  Weeks    Status  New      OT SHORT TERM GOAL #4   Title  Pt to be independent with visual scanning strategies to compensate for Lt visual field cut    Time  4    Period  Weeks    Status  New      OT SHORT TERM GOAL #5   Title  Pt to stand to wash dishes and fold clothes/transfer clothes w/ supervision prn    Time  4    Period  Weeks    Status  New        OT Long Term Goals - 11/08/18 1513      OT LONG TERM GOAL #1   Title  Pt to be independent with LUE strengthening HEP for shoulder    Time  8    Period  Weeks    Status  New      OT LONG TERM GOAL #2   Title  Pt  to improve grip strength Lt hand by 10 lbs or more to assist in opening tight jars    Baseline  31 lbs    Time  8    Period  Weeks    Status  New      OT LONG TERM GOAL #3   Title  Pt to perform environmental scanning with 90% accuracy using strategies prn    Time  8    Period  Weeks    Status  New      OT LONG  TERM GOAL #4   Title  Pt to return to light cooking tasks at mod I level safely    Time  8    Period  Weeks    Status  New      OT LONG TERM GOAL #5   Title  Pt to return to light household work in prep for living alone again    Time  8    Period  Weeks    Status  New            Plan - 11/08/18 1505    Clinical Impression Statement  Pt is a 78 y.o. female who presents to outpatient rehab s/p Rt PCA CVA on 09/09/18 with Lt hemiparesis and Lt visual field cut. Pt presents w/ decreased coordination and strength LUE, decreased sensation, visual deficits, and decreased ability to perform ADLS. Pt would benefit from O.T. to address these deficits and maximize independence and safety with ADLS.    Occupational performance deficits (Please refer to evaluation for details):  ADL's;IADL's;Social Participation;Leisure    Body Structure / Function / Physical Skills  ADL;Vision;IADL;Balance;Endurance;Sensation;Mobility;Strength;Coordination;FMC;UE functional use;Pain;Proprioception    Youth worker;Safety Awareness;Problem Solve    Rehab Potential  Good    Clinical Decision Making  Several treatment options, min-mod task modification necessary    Comorbidities Affecting Occupational Performance:  Presence of comorbidities impacting occupational performance    Comorbidities impacting occupational performance description:  CAD and severe aortic stenosis w/ upcoming surgery for aortic valve replacement on 11/13/18. Pt also w/ previous CVA 2015 w/ mild residual Lt coordination deficits    Modification or Assistance to Complete Evaluation   Min-Moderate modification of tasks or  assist with assess necessary to complete eval    OT Frequency  2x / week    OT Duration  8 weeks   plus eval   OT Treatment/Interventions  Self-care/ADL training;Therapeutic exercise;Functional Mobility Training;Neuromuscular education;Aquatic Therapy;Therapeutic activities;Coping strategies training;DME and/or AE instruction;Cognitive remediation/compensation;Visual/perceptual remediation/compensation;Moist Heat;Passive range of motion;Patient/family education    Plan  Pt to return after surgery on 11/13/18 with resume orders required (pt made aware). Issue coordination and putty HEP for Lt hand    Consulted and Agree with Plan of Care  Patient;Family member/caregiver    Family Member Consulted  Brother       Patient will benefit from skilled therapeutic intervention in order to improve the following deficits and impairments:   Body Structure / Function / Physical Skills: ADL, Vision, IADL, Balance, Endurance, Sensation, Mobility, Strength, Coordination, FMC, UE functional use, Pain, Proprioception Cognitive Skills: Memory, Safety Awareness, Problem Solve     Visit Diagnosis: Hemiplegia and hemiparesis following cerebral infarction affecting left non-dominant side (HCC)  Other lack of coordination  Muscle weakness (generalized)  Visual field cut  Altered sensation due to acute stroke [facility])    Problem List Patient Active Problem List   Diagnosis Date Noted  . Severe aortic stenosis 10/23/2018  . Stroke ([place]) 09/09/2018  . Chronic kidney disease (CKD), stage IV (severe) ([place]) 12/05/2017  . Post PTCA 01/07/2014  . CAD (coronary artery disease), native coronary artery 01/07/2014  . S/P PTCA (percutaneous transluminal coronary angioplasty) 01/07/2014  . Angina pectoris associated with type 2 diabetes mellitus ([place]) 01/06/2014  . History of cerebrovascular accident 01/30/2013  . Headache(784.0) 01/30/2013    Carey Bullocks, OTR/L 11/08/2018, 3:16 PM  [facility] [address] [place], [place], [address] Phone: [phone]   Fax:  [phone]  Name: Susan Creath  Kennedy MRN: [id number] Date of Birth: January 11, 1941

## 2018-11-09 ENCOUNTER — Other Ambulatory Visit: Payer: Self-pay

## 2018-11-09 ENCOUNTER — Encounter (HOSPITAL_COMMUNITY): Payer: Self-pay

## 2018-11-09 ENCOUNTER — Ambulatory Visit (HOSPITAL_COMMUNITY)
Admission: RE | Admit: 2018-11-09 | Discharge: 2018-11-09 | Disposition: A | Discharge status: Home / Self Care | Payer: Medicare Other | Source: Ambulatory Visit | Attending: Cardiovascular Disease | Admitting: Cardiovascular Disease

## 2018-11-09 ENCOUNTER — Encounter: Payer: Self-pay | Admitting: Physical Therapy

## 2018-11-09 ENCOUNTER — Other Ambulatory Visit (HOSPITAL_COMMUNITY)
Admission: RE | Admit: 2018-11-09 | Discharge: 2018-11-09 | Disposition: A | Discharge status: Home / Self Care | Payer: Medicare Other | Source: Ambulatory Visit | Attending: Cardiovascular Disease | Admitting: Cardiovascular Disease

## 2018-11-09 ENCOUNTER — Ambulatory Visit: Payer: Medicare Other | Admitting: Physical Therapy

## 2018-11-09 ENCOUNTER — Encounter (HOSPITAL_COMMUNITY)
Admission: RE | Admit: 2018-11-09 | Discharge: 2018-11-09 | Disposition: A | Discharge status: Home / Self Care | Payer: Medicare Other | Source: Ambulatory Visit | Attending: Cardiovascular Disease | Admitting: Cardiovascular Disease

## 2018-11-09 DIAGNOSIS — E1142 Type 2 diabetes mellitus with diabetic polyneuropathy: Secondary | ICD-10-CM | POA: Diagnosis not present

## 2018-11-09 DIAGNOSIS — E1122 Type 2 diabetes mellitus with diabetic chronic kidney disease: Secondary | ICD-10-CM | POA: Insufficient documentation

## 2018-11-09 DIAGNOSIS — Z20828 Contact with and (suspected) exposure to other viral communicable diseases: Secondary | ICD-10-CM | POA: Insufficient documentation

## 2018-11-09 DIAGNOSIS — N184 Chronic kidney disease, stage 4 (severe): Secondary | ICD-10-CM | POA: Insufficient documentation

## 2018-11-09 DIAGNOSIS — I130 Hypertensive heart and chronic kidney disease with heart failure and stage 1 through stage 4 chronic kidney disease, or unspecified chronic kidney disease: Secondary | ICD-10-CM | POA: Diagnosis not present

## 2018-11-09 DIAGNOSIS — Z6828 Body mass index (BMI) 28.0-28.9, adult: Secondary | ICD-10-CM | POA: Diagnosis not present

## 2018-11-09 DIAGNOSIS — Z8673 Personal history of transient ischemic attack (TIA), and cerebral infarction without residual deficits: Secondary | ICD-10-CM | POA: Diagnosis not present

## 2018-11-09 DIAGNOSIS — Z79899 Other long term (current) drug therapy: Secondary | ICD-10-CM | POA: Insufficient documentation

## 2018-11-09 DIAGNOSIS — F329 Major depressive disorder, single episode, unspecified: Secondary | ICD-10-CM | POA: Insufficient documentation

## 2018-11-09 DIAGNOSIS — J90 Pleural effusion, not elsewhere classified: Secondary | ICD-10-CM | POA: Diagnosis not present

## 2018-11-09 DIAGNOSIS — E11319 Type 2 diabetes mellitus with unspecified diabetic retinopathy without macular edema: Secondary | ICD-10-CM | POA: Insufficient documentation

## 2018-11-09 DIAGNOSIS — Z933 Colostomy status: Secondary | ICD-10-CM | POA: Insufficient documentation

## 2018-11-09 DIAGNOSIS — I350 Nonrheumatic aortic (valve) stenosis: Secondary | ICD-10-CM | POA: Insufficient documentation

## 2018-11-09 DIAGNOSIS — Z7902 Long term (current) use of antithrombotics/antiplatelets: Secondary | ICD-10-CM | POA: Diagnosis not present

## 2018-11-09 DIAGNOSIS — Z794 Long term (current) use of insulin: Secondary | ICD-10-CM | POA: Diagnosis not present

## 2018-11-09 DIAGNOSIS — Z7982 Long term (current) use of aspirin: Secondary | ICD-10-CM | POA: Diagnosis not present

## 2018-11-09 DIAGNOSIS — Z01818 Encounter for other preprocedural examination: Secondary | ICD-10-CM | POA: Diagnosis not present

## 2018-11-09 DIAGNOSIS — M1990 Unspecified osteoarthritis, unspecified site: Secondary | ICD-10-CM | POA: Insufficient documentation

## 2018-11-09 DIAGNOSIS — R278 Other lack of coordination: Secondary | ICD-10-CM

## 2018-11-09 DIAGNOSIS — I2510 Atherosclerotic heart disease of native coronary artery without angina pectoris: Secondary | ICD-10-CM | POA: Insufficient documentation

## 2018-11-09 DIAGNOSIS — E785 Hyperlipidemia, unspecified: Secondary | ICD-10-CM | POA: Insufficient documentation

## 2018-11-09 DIAGNOSIS — Z90710 Acquired absence of both cervix and uterus: Secondary | ICD-10-CM | POA: Insufficient documentation

## 2018-11-09 DIAGNOSIS — I509 Heart failure, unspecified: Secondary | ICD-10-CM | POA: Diagnosis not present

## 2018-11-09 DIAGNOSIS — E669 Obesity, unspecified: Secondary | ICD-10-CM | POA: Insufficient documentation

## 2018-11-09 DIAGNOSIS — M6281 Muscle weakness (generalized): Secondary | ICD-10-CM

## 2018-11-09 DIAGNOSIS — I69354 Hemiplegia and hemiparesis following cerebral infarction affecting left non-dominant side: Secondary | ICD-10-CM

## 2018-11-09 LAB — CBC
HCT: 32.5 % — ABNORMAL LOW (ref 36.0–46.0)
Hemoglobin: 10.4 g/dL — ABNORMAL LOW (ref 12.0–15.0)
MCH: 30.2 pg (ref 26.0–34.0)
MCHC: 32 g/dL (ref 30.0–36.0)
MCV: 94.5 fL (ref 80.0–100.0)
Platelets: 236 10*3/uL (ref 150–400)
RBC: 3.44 MIL/uL — ABNORMAL LOW (ref 3.87–5.11)
RDW: 14.6 % (ref 11.5–15.5)
WBC: 7.6 10*3/uL (ref 4.0–10.5)
nRBC: 0 % (ref 0.0–0.2)

## 2018-11-09 LAB — TYPE AND SCREEN
ABO/RH(D): AB POS
Antibody Screen: NEGATIVE

## 2018-11-09 LAB — URINALYSIS, ROUTINE W REFLEX MICROSCOPIC
Bilirubin Urine: NEGATIVE
Glucose, UA: NEGATIVE mg/dL
Hgb urine dipstick: NEGATIVE
Ketones, ur: NEGATIVE mg/dL
Nitrite: NEGATIVE
Protein, ur: 100 mg/dL — AB
Specific Gravity, Urine: 1.012 (ref 1.005–1.030)
pH: 7 (ref 5.0–8.0)

## 2018-11-09 LAB — HEMOGLOBIN A1C
Hgb A1c MFr Bld: 7.6 % — ABNORMAL HIGH (ref 4.8–5.6)
Mean Plasma Glucose: 171.42 mg/dL

## 2018-11-09 LAB — COMPREHENSIVE METABOLIC PANEL
ALT: 12 U/L (ref 0–44)
AST: 20 U/L (ref 15–41)
Albumin: 3.1 g/dL — ABNORMAL LOW (ref 3.5–5.0)
Alkaline Phosphatase: 61 U/L (ref 38–126)
Anion gap: 9 (ref 5–15)
BUN: 36 mg/dL — ABNORMAL HIGH (ref 8–23)
CO2: 24 mmol/L (ref 22–32)
Calcium: 9.2 mg/dL (ref 8.9–10.3)
Chloride: 105 mmol/L (ref 98–111)
Creatinine, Ser: 3.31 mg/dL — ABNORMAL HIGH (ref 0.44–1.00)
GFR calc Af Amer: 15 mL/min — ABNORMAL LOW (ref >60)
GFR calc non Af Amer: 13 mL/min — ABNORMAL LOW (ref >60)
Glucose, Bld: 234 mg/dL — ABNORMAL HIGH (ref 70–99)
Potassium: 3.9 mmol/L (ref 3.5–5.1)
Sodium: 138 mmol/L (ref 135–145)
Total Bilirubin: 0.3 mg/dL (ref 0.3–1.2)
Total Protein: 6.3 g/dL — ABNORMAL LOW (ref 6.5–8.1)

## 2018-11-09 LAB — SURGICAL PCR SCREEN
MRSA, PCR: NEGATIVE
Staphylococcus aureus: NEGATIVE

## 2018-11-09 LAB — ABO/RH: ABO/RH(D): AB POS

## 2018-11-09 LAB — GLUCOSE, CAPILLARY: Glucose-Capillary: 249 mg/dL — ABNORMAL HIGH (ref 70–99)

## 2018-11-09 LAB — PROTIME-INR
INR: 1.1 (ref 0.8–1.2)
Prothrombin Time: 13.8 seconds (ref 11.4–15.2)

## 2018-11-09 LAB — APTT: aPTT: 28 seconds (ref 24–36)

## 2018-11-09 LAB — BRAIN NATRIURETIC PEPTIDE: B Natriuretic Peptide: 1116.4 pg/mL — ABNORMAL HIGH (ref 0.0–100.0)

## 2018-11-09 NOTE — Therapy (Signed)
[place], [place], [address] Phone: [phone]   Fax:  [phone]  Physical Therapy Treatment  / Re-evaluation  Patient Details  Name: [name] MRN: [id number] Date of Birth: 12/18/1940 Referring Provider (PT): Lauree Chandler MD   Encounter Date: 11/09/2018  PT End of Session - 11/09/18 0906    Visit Number  5    Number of Visits  12    Date for PT Re-Evaluation  11/12/18    Authorization Type  UHC MCR progress note every 10, KX 15    PT Start Time  0830    PT Stop Time  0901    PT Time Calculation (min)  31 min    Activity Tolerance  Patient tolerated treatment well    Behavior During Therapy  [facility] for tasks assessed/performed       Past Medical History:  Diagnosis Date  . Anginal pain ([place])   . Arthritis   . Cerebrovascular disease   . CKD (chronic kidney disease)    Sees Dr Deterding  . Coronary artery disease   . Depression   . Diabetes ([place])    INSULIN DEPENDENT  . Diabetic peripheral neuropathy ([place])   . Diabetic retinopathy ([place])   . Diverticulitis   . Dyslipidemia   . GERD (gastroesophageal reflux disease)   . Headache(784.0) 01/30/2013  . Heart murmur   . History of cerebrovascular accident 01/30/2013  . Hyperlipidemia   . Hypertension   . Migraine   . Obesity   . Stroke ([place])    x 2 no residulal  . TIA (transient ischemic attack)    " MULTIPLE PER PATIENT "; had left sided weakness- rehab and wants without assistance    Past Surgical History:  Procedure Laterality Date  . ABDOMINAL HYSTERECTOMY    . AV FISTULA PLACEMENT Left 12/18/2017   Procedure: ARTERIOVENOUS (AV) FISTULA CREATION ARM;  Surgeon: Marty Heck, MD;  Location: [facility];  Service: Vascular;  Laterality: Left;  . bilateral cataract surgery    . CARDIAC CATHETERIZATION  01/07/2014   DR [place] RESECTION  02/1999  . COLONOSCOPY    . COLOSTOMY  02/1999  . COLOSTOMY CLOSURE  07/1999   . EYE SURGERY Left 2019  . FISTULA SUPERFICIALIZATION Left 06/29/2018   Procedure: FISTULA SUPERFICIALIZATION LEFT BRACHIOCEPHALIC;  Surgeon: Marty Heck, MD;  Location: [place];  Service: Vascular;  Laterality: Left;  . LEFT HEART CATHETERIZATION WITH CORONARY ANGIOGRAM N/A 01/07/2014   Procedure: LEFT HEART CATHETERIZATION WITH CORONARY ANGIOGRAM;  Surgeon: Laverda Page, MD;  Location: [facility] CATH LAB;  Service: Cardiovascular;  Laterality: N/A;  . middle cerebral artery stent placement Right   . OTHER SURGICAL HISTORY     laser surgery  . PTCA  01/07/2014   DES to RCA    DR Einar Gip  . right knee surgery Right    for infection  . RIGHT/LEFT HEART CATH AND CORONARY ANGIOGRAPHY N/A 10/23/2018   Procedure: RIGHT/LEFT HEART CATH AND CORONARY ANGIOGRAPHY;  Surgeon: Nigel Mormon, MD;  Location: [place] CV LAB;  Service: Cardiovascular;  Laterality: N/A;    There were no vitals filed for this visit.  Subjective Assessment - 11/09/18 0836    Subjective  pt is a 78 y.o with CC of SOB and fatigue that has been going on since June and has progressively worsened since onset. pt is currently actively undergoing physical therapy and occupation therapy due to having a stroke in  August.    Patient Stated Goals  to get heart better    Currently in Pain?  No/denies         [facility] PT Assessment - 11/09/18 0001      Assessment   Medical Diagnosis  CVA (Rt PCA) w/ Lt hemiparesis   Severe Aortic stenossi   Referring Provider (PT)  Lauree Chandler MD    Hand Dominance  Right      Precautions   Precautions  Fall    Precaution Comments  no driving      Balance Screen   Has the patient fallen in the past 6 months  No    Has the patient had a decrease in activity level because of a fear of falling?   No      Prior Function   Level of Independence  Independent    Vocation  Retired      AROM   Overall AROM Comments  BUE AROM WNL's with mild limited reaching behind the back  with RUE      Strength   Overall Strength  Within functional limits for tasks performed    Overall Strength Comments  L hip flexion 4/5, knee ext 4+/5 and ankle DF 4+/5    Right Hand Grip (lbs)  48    Left Hand Grip (lbs)  34      Ambulation/Gait   Ambulation/Gait  Yes    Gait Pattern  Decreased arm swing - right;Decreased arm swing - left;Decreased trunk rotation    Gait Comments  pt ambulated 232 ft in 1:45 requiring 1 min rest break O2 96% and HR 95, She walked an additional  477 with out rest finishing the 6 min walk test       [facility] Pre-Surgical Assessment - 11/09/18 0001    5 Meter Walk Test- trial 1  5 sec    5 Meter Walk Test- trial 2  5 sec.     5 Meter Walk Test- trial 3  5 sec.    5 meter walk test average  5 sec    4 Stage Balance Test tolerated for:   10 sec.    4 Stage Balance Test Position  2    Sit To Stand Test- trial 1  21 sec.    ADL/IADL Independent with:  Bathing;Dressing;Meal prep;Finances    ADL/IADL Needs Assistance with:  Valla Leaver work    ADL/IADL Fraility Index  Midly frail    6 Minute Walk- Baseline  yes    BP (mmHg)  152/56    HR (bpm)  68    02 Sat (%RA)  99 %    Modified Borg Scale for Dyspnea  2- Mild shortness of breath    Perceived Rate of Exertion (Borg)  11- Fairly light    6 Minute Walk Post Test  yes    BP (mmHg)  151/59    HR (bpm)  102    02 Sat (%RA)  88 %    Modified Borg Scale for Dyspnea  7- Severe shortness of breath or very hard breathing    Perceived Rate of Exertion (Borg)  15- Hard    Aerobic Endurance Distance Walked  709    Endurance additional comments  pt is 80% limited compared to age related norm                              PT Long Term Goals - 10/01/18 [id number]  PT LONG TERM GOAL #1   Title  Pt will be I and compliant with HEP. (Target for all goals 6 weeks 11/12/18)    Status  New      PT LONG TERM GOAL #2   Title  Pt will improve BERG balance score to at least 50 to show improved balance     Baseline  42    Status  New      PT LONG TERM GOAL #3   Title  Pt will improve FGA to at least 19 to show improved balance.    Baseline  14    Status  New      PT LONG TERM GOAL #4   Title  Pt will improve Lt sided strength to grossly 5-/5 MMT tested in sitting to improve function.    Status  New      PT LONG TERM GOAL #5   Title  Pt will be able to ambulate community distances 1000 ft on even and uneven surfaces and negotiate her 13 stairs while being able to carry something up/down the stairs.    Status  New            Plan - 11/09/18 0905    Clinical Impression Statement  see assessment in note    PT Frequency  One time visit    PT Next Visit Plan  PRe-TAVR evaluation        Clinical Impression Statement: Pt is a 78 yo F presenting to OP PT for evaluation prior to possible TAVR surgery due to severe aortic stenosis. Pt reports onset of SOB and general fatigue approximately 4-5 months ago. Symptoms are limiting endurance. Pt presents with functional ROM and strength mild limiation LLE secondary to hx of stroke, poor balance and is assessed as moderate at high fall risk 4 stage balance test, limited walking speed and limited aerobic endurance per 6 minute walk test.pt ambulated 232 ft in 1:45 requiring 1 min rest break O2 96% and HR 95, She walked an additional  477 with out rest finishing the 6 min walk test on room air. Pt reported 7/10 shortness of breath on modified scale for dyspnea.. SOB, fatigue, and postural sway increased significantly with 6 minute walk test. Based on the Short Physical Performance Battery, patient has a frailty rating of 7/12 with </= 5/12 considered frail.    Patient demonstrated the following deficits and impairments:     Visit Diagnosis: Hemiplegia and hemiparesis following cerebral infarction affecting left non-dominant side (HCC)  Other lack of coordination  Muscle weakness (generalized)     Problem List Patient Active Problem List    Diagnosis Date Noted  . Severe aortic stenosis 10/23/2018  . Stroke ([place]) 09/09/2018  . Chronic kidney disease (CKD), stage IV (severe) ([place]) 12/05/2017  . Post PTCA 01/07/2014  . CAD (coronary artery disease), native coronary artery 01/07/2014  . S/P PTCA (percutaneous transluminal coronary angioplasty) 01/07/2014  . Angina pectoris associated with type 2 diabetes mellitus ([place]) 01/06/2014  . History of cerebrovascular accident 01/30/2013  . Headache(784.0) 01/30/2013   [place] PT, DPT, LAT, ATC  11/09/18  9:09 AM      [facility] [address] [place], [place], [address] Phone: [phone]   Fax:  [phone]  Name: ILYANNA BIBER MRN: [id number] Date of Birth: [birth year]

## 2018-11-09 NOTE — Pre-Procedure Instructions (Addendum)
Scheduled for Covid test today. No sx of Covid. No recent h/o travel. No h/o exposure to Covid positive patients.  PCP - Dr Merrilee Seashore  Cardiologist - Dr Virgina Jock  Urology- Dr Hollie Salk  Endocrinology-Dr [place]  Chest x-ray - today  EKG - today  Stress Test - 12-09-13  ECHO -09-10-18   Cardiac Cath - 10-23-18  AICD-denies PM-denies LOOP-denies  Sleep Study -Y  CPAP - Does not use  LABS-CBC,CMP,A1C,PT,PTT,UA,ABG,T/S,ABG,BNP Abnormal labs(H/oCHF,CKD). MD notified.  PLAVIX- LD=10/26 per Dr instructions.  ERAS-NA  HA1C- Last A1C=7.6(09/12/18)   Today.  Fasting Blood Sugar - 249( Took 4 U Humalog this morning)  Lo-72  Hi-265 Checks Blood Sugar __4___ times a day  Anesthesia-Y HTN, CHF,CKD, IDDM,Strokes  Pt state that Dr [name] informed her to take Allopurinol and Labetalol on the DOS.  Pt denies having chest pain, sob, or fever at this time. All instructions explained to the pt, with a verbal understanding of the material. Pt agrees to go over the instructions while at home for a better understanding. Pt also instructed to self quarantine after being tested for COVID-19. The opportunity to ask questions was provided.

## 2018-11-10 LAB — NOVEL CORONAVIRUS, NAA (HOSP ORDER, SEND-OUT TO REF LAB; TAT 18-24 HRS): SARS-CoV-2, NAA: NOT DETECTED

## 2018-11-12 ENCOUNTER — Encounter (HOSPITAL_COMMUNITY): Payer: Self-pay | Admitting: Certified Registered Nurse Anesthetist

## 2018-11-12 ENCOUNTER — Ambulatory Visit: Payer: Medicare Other | Admitting: Physical Therapy

## 2018-11-12 MED ORDER — DEXMEDETOMIDINE HCL IN NACL 400 MCG/100ML IV SOLN
0.1000 ug/kg/h | INTRAVENOUS | Status: AC
  Administered 2018-11-13: 1 ug/kg/h via INTRAVENOUS
  Filled 2018-11-12: qty 100

## 2018-11-12 MED ORDER — MAGNESIUM SULFATE 50 % IJ SOLN
40.0000 meq | INTRAMUSCULAR | Status: DC
  Filled 2018-11-12: qty 9.85

## 2018-11-12 MED ORDER — SODIUM CHLORIDE 0.9 % IV SOLN
1.5000 g | INTRAVENOUS | Status: AC
  Administered 2018-11-13: 1.5 g via INTRAVENOUS
  Filled 2018-11-12: qty 1.5

## 2018-11-12 MED ORDER — SODIUM CHLORIDE 0.9 % IV SOLN
INTRAVENOUS | Status: DC
  Filled 2018-11-12: qty 30

## 2018-11-12 MED ORDER — VANCOMYCIN HCL 10 G IV SOLR
1250.0000 mg | INTRAVENOUS | Status: AC
  Administered 2018-11-13: 1250 mg via INTRAVENOUS
  Filled 2018-11-12: qty 1250

## 2018-11-12 MED ORDER — POTASSIUM CHLORIDE 2 MEQ/ML IV SOLN
80.0000 meq | INTRAVENOUS | Status: DC
  Filled 2018-11-12: qty 40

## 2018-11-12 MED ORDER — NOREPINEPHRINE 4 MG/250ML-% IV SOLN
0.0000 ug/min | INTRAVENOUS | Status: AC
  Administered 2018-11-13: 1 ug/min via INTRAVENOUS
  Filled 2018-11-12: qty 250

## 2018-11-12 NOTE — Anesthesia Preprocedure Evaluation (Addendum)
Anesthesia Evaluation  Patient identified by MRN, date of birth, ID band Patient awake    Reviewed: Allergy & Precautions, H&P , NPO status , Patient's Chart, lab work & pertinent test results  Airway Mallampati: II   Neck ROM: full    Dental   Pulmonary neg pulmonary ROS,    breath sounds clear to auscultation       Cardiovascular hypertension, + angina + CAD  + Valvular Problems/Murmurs AS  Rhythm:regular Rate:Normal  AVA 0.42 cm2. Mean gradient of 40 mmHg   Neuro/Psych  Headaches, PSYCHIATRIC DISORDERS Depression TIA Neuromuscular disease CVA    GI/Hepatic GERD  ,  Endo/Other  diabetes, Type 2  Renal/GU Renal InsufficiencyRenal disease     Musculoskeletal  (+) Arthritis ,   Abdominal   Peds  Hematology   Anesthesia Other Findings   Reproductive/Obstetrics                            Anesthesia Physical Anesthesia Plan  ASA: III  Anesthesia Plan: MAC   Post-op Pain Management:    Induction: Intravenous  PONV Risk Score and Plan: 2 and Propofol infusion, Ondansetron, Midazolam and Treatment may vary due to age or medical condition  Airway Management Planned: Simple Face Mask  Additional Equipment: Arterial line and CVP  Intra-op Plan:   Post-operative Plan:   Informed Consent: I have reviewed the patients History and Physical, chart, labs and discussed the procedure including the risks, benefits and alternatives for the proposed anesthesia with the patient or authorized representative who has indicated his/her understanding and acceptance.       Plan Discussed with: CRNA, Anesthesiologist and Surgeon  Anesthesia Plan Comments: (See PAT note written 11/12/2018 by Myra Gianotti, PA-C. )       Anesthesia Quick Evaluation

## 2018-11-12 NOTE — Progress Notes (Signed)
Anesthesia Chart Review:  Case: [id number] Date/Time: 11/13/18 0715   Procedures:      TRANSCATHETER AORTIC VALVE REPLACEMENT, TRANSFEMORAL (N/A Chest)     TRANSESOPHAGEAL ECHOCARDIOGRAM (TEE) (N/A )   Anesthesia type: General   Pre-op diagnosis: Severe Aortic Stenosis   Location: [facility] ROOM 16 / [facility]   Surgeon: Burnell Blanks, MD      DISCUSSION: Patient is a 78 year old female scheduled for the above procedure.  History includes never smoker, murmur (severe AS, moderate AR), CAD/angina (DES distal RCA 01/07/14; 60% ~ 60% ostial RCA 10/23/18), IDDM (with retinopathy, nephropathy), CVA (recurrent; history of left MCA stent 2000 with subsequent occlusion ~ 2002; 2015; last 09/09/18, right PCA distribution), dyslipidemia, migraines, diverticulitis/mesenteric abscess (s/p colostomy, s/p takedown ~ 07/1999), CKD stage IV (has left brachiocephalic AVF).   Severe AS. 60% RCA treated medically. Multiple CVAs, last 09/09/18. Last Plavix 11/12/18. Cr 3.31--known CKD stage IV with reportedly functioning AVF if needed. A1c 7.6%. 11/09/18 COVID-19 test negative. Anesthesia team to evaluate on the day of surgery. (Staff message sent to Nell Range, Hershal Coria and Theodosia Quay, RN regarding labs and most recent BP trends. Dr. [name] advised her to take labetalol and amlodipine on the morning of surgery.)   VS: BP (!) 122/38 Comment: taken manually in R arm/notified Sapna K. RN  Pulse 64   Temp 36.6 C   Resp 20   Ht 5' 2.5" (1.588 m)   Wt 72.6 kg   SpO2 100%   BMI 28.80 kg/m     PROVIDERS: Merrilee Seashore, MD is PCP - Vernell Leep, MD is cardiologist - Madelon Lips, MD is nephrologist - Madelin Rear, MD is endocrinologist - Kathrynn Ducking, MD is neurologist. Last seen on 09/18/18, "The patient has had a cardiac monitor placed today.  She may require surgery for the aortic valve stenosis in the near future.  The patient will stay on aspirin and Plavix combination for 3 weeks and  then convert to Plavix alone.  She will follow-up here in 4 months.  Physical therapy will be set up for the gait disturbance."   LABS: Preoperative labs noted. BNP 1116. Cr 3.31 (2.70-4.18 since 10/16/18). H/H 10.4/32.5, consistent with previous results. UA with large leukocytes, negative nitrites, 21-50 WBC. A1c 7.6%. Reported home CBGs ~ 70-265.  (all labs ordered are listed, but only abnormal results are displayed)  Labs Reviewed  GLUCOSE, CAPILLARY - Abnormal; Notable for the following components:      Result Value   Glucose-Capillary 249 (*)    All other components within normal limits  BRAIN NATRIURETIC PEPTIDE - Abnormal; Notable for the following components:   B Natriuretic Peptide 1,116.4 (*)    All other components within normal limits  CBC - Abnormal; Notable for the following components:   RBC 3.44 (*)    Hemoglobin 10.4 (*)    HCT 32.5 (*)    All other components within normal limits  COMPREHENSIVE METABOLIC PANEL - Abnormal; Notable for the following components:   Glucose, Bld 234 (*)    BUN 36 (*)    Creatinine, Ser 3.31 (*)    Total Protein 6.3 (*)    Albumin 3.1 (*)    GFR calc non Af Amer 13 (*)    GFR calc Af Amer 15 (*)    All other components within normal limits  HEMOGLOBIN A1C - Abnormal; Notable for the following components:   Hgb A1c MFr Bld 7.6 (*)    All other components  within normal limits  URINALYSIS, ROUTINE W REFLEX MICROSCOPIC - Abnormal; Notable for the following components:   Protein, ur 100 (*)    Leukocytes,Ua LARGE (*)    Bacteria, UA FEW (*)    All other components within normal limits  SURGICAL PCR SCREEN  APTT  PROTIME-INR  BLOOD GAS, ARTERIAL  TYPE AND SCREEN     IMAGES: CXR 11/09/18: IMPRESSION: Congestive heart failure bilateral interstitial edema and bilateral pleural effusions. Interim improvement from prior exam.  MRI/MRA head 09/10/18: IMPRESSION: 1. Acute infarct right occipital lobe. No associated hemorrhage in the  infarct. Chronic microhemorrhage right parietal white matter appears separate. 2. Right M1 stent obscures evaluation of the right middle cerebral artery on MRA 3. Right posterior cerebral artery patent. 4. Occluded distal P3 branch on the left.   EKG: 11/09/18: SR with first degree AV block, possible LAE.   CV: CT Coronary 11/01/2018: IMPRESSION: 1. Trileaflet aortic valve with severely calcified leaflets with severely restricted leaflets opening and no calcifications extending into the LVOT. Aortic valve calcium score 2413 consistent with severe aortic stenosis. Annular measurements borderline between 23 and 26 mm valves, but would favor 26 mm Edwards-SAPIEN 3 valve. 2. Sufficient coronary to annulus distance. 3. Optimum Fluoroscopic Angle for Delivery:  LAO 18 CAU 21. 4. No thrombus in the left atrial appendage. - ADDENDUM: Correction: Annular measurements of the aortic valve borderline between 20 and 23 mm Edwards-SAPIEN 3 Ultra valve (not between 23 and 26 mm as previously stated).   Cardiac cath 10/23/2018: RA: 4 mmHg RV: 54/9 mmHg PA: 54/17 mmHg, mean PAP 34 mmhg. PCWP: 25 mmHg with tall V wave CO: 5.2 L/min CI: 3 L/min/m2  LM: Normal LAD: Normal LCx:: Normal RCA: Ostial calcific at least 60% stenosis. Ostial RPDA 20% stenosis.   Right coronary artery was difficult to engage. I had to use multiple catheters, and was finally able to engage with [facility] catheter. there was dampnening of the pressure with catheter engagement. Ostium is at least 60% stenosed with moderate calcification. There was no signifciant improvement with IC NTG. I did not perform FFR in order to limit contrast use in this patient with Cr of 3.0, not on dialysis.   Recommendation: Will discuss with structural valve clinic. If functional evaluation of RCA is felt necesary prior to TAVR/SAVR, would consider non-invasive stress test.  Nigel Mormon, MD - Had Lexiscan Myoview Stress  Test07/20/2020: Stress EKG is non-diagnostic, as this is pharmacological stress test. Myocardial perfusion imaging is normal. Left ventricular ejection fraction is 50% with normal wall motion. Low risk study.)   Echo 09/10/2018: IMPRESSIONS  1. The left ventricle has normal systolic function with an ejection fraction of 60-65%. The cavity size was normal. There is severe asymmetric left ventricular hypertrophy with mild concentric LV hypertrophy.  2. The right ventricle has normal systolic function. The cavity was normal. There is no increase in right ventricular wall thickness. Right ventricular systolic pressure is moderately elevated.  3. Left atrial size was mildly dilated.  4. The aortic valve is tricuspid. Moderate thickening of the aortic valve. Severe calcifcation of the aortic valve. Aortic valve regurgitation is moderate by color flow Doppler. Severe stenosis of the aortic valve. Moderate aortic annular calcification  noted. AV Vmax: 379.20 cm/s. AV Mean Grad: 31mmHg . AV Area (VTI): 0.42 cm. LVOT/AV VTI ratio: 0.23.  5. The aorta is normal unless otherwise noted.  6. The inferior vena cava was dilated in size with >50% respiratory variability.   Carotid US  09/10/2018: Summary: - Right Carotid: Velocities in the right ICA are consistent with a 1-39% stenosis. - Left Carotid: Velocities in the left ICA are consistent with a 1-39% stenosis. - Vertebrals: Bilateral vertebral arteries demonstrate antegrade flow.    Past Medical History:  Diagnosis Date  . Anginal pain ([place])   . Arthritis   . Cerebrovascular disease   . CKD (chronic kidney disease)    Sees Dr Deterding  . Coronary artery disease   . Depression   . Diabetes ([place])    INSULIN DEPENDENT  . Diabetic peripheral neuropathy ([place])   . Diabetic retinopathy ([place])   . Diverticulitis   . Dyslipidemia   . GERD (gastroesophageal reflux disease)   . Headache(784.0) 01/30/2013  . Heart murmur   . History of  cerebrovascular accident 01/30/2013  . Hyperlipidemia   . Hypertension   . Migraine   . Obesity   . Stroke ([place])    x 2 no residulal  . TIA (transient ischemic attack)    " MULTIPLE PER PATIENT "; had left sided weakness- rehab and wants without assistance    Past Surgical History:  Procedure Laterality Date  . ABDOMINAL HYSTERECTOMY    . AV FISTULA PLACEMENT Left 12/18/2017   Procedure: ARTERIOVENOUS (AV) FISTULA CREATION ARM;  Surgeon: Marty Heck, MD;  Location: [place];  Service: Vascular;  Laterality: Left;  . bilateral cataract surgery    . CARDIAC CATHETERIZATION  01/07/2014   DR Napanoch RESECTION  02/1999  . COLONOSCOPY    . COLOSTOMY  02/1999  . COLOSTOMY CLOSURE  07/1999  . EYE SURGERY Left 2019  . FISTULA SUPERFICIALIZATION Left 06/29/2018   Procedure: FISTULA SUPERFICIALIZATION LEFT BRACHIOCEPHALIC;  Surgeon: Marty Heck, MD;  Location: [place];  Service: Vascular;  Laterality: Left;  . LEFT HEART CATHETERIZATION WITH CORONARY ANGIOGRAM N/A 01/07/2014   Procedure: LEFT HEART CATHETERIZATION WITH CORONARY ANGIOGRAM;  Surgeon: Laverda Page, MD;  Location: [facility] CATH LAB;  Service: Cardiovascular;  Laterality: N/A;  . middle cerebral artery stent placement Right   . OTHER SURGICAL HISTORY     laser surgery  . PTCA  01/07/2014   DES to RCA    DR Einar Gip  . right knee surgery Right    for infection  . RIGHT/LEFT HEART CATH AND CORONARY ANGIOGRAPHY N/A 10/23/2018   Procedure: RIGHT/LEFT HEART CATH AND CORONARY ANGIOGRAPHY;  Surgeon: Nigel Mormon, MD;  Location: [place] CV LAB;  Service: Cardiovascular;  Laterality: N/A;    MEDICATIONS: . acetaminophen (TYLENOL) 325 MG tablet  . allopurinol (ZYLOPRIM) 100 MG tablet  . amLODipine (NORVASC) 10 MG tablet  . aspirin EC 81 MG tablet  . B-D INS SYR ULTRAFINE 1CC/30G 30G X 1/2" 1 ML MISC  . calcitRIOL (ROCALTROL) 0.25 MCG capsule  . clopidogrel (PLAVIX) 75 MG tablet  . folic acid (FOLVITE) 1  MG tablet  . HUMALOG KWIKPEN 100 UNIT/ML KwikPen  . insulin glargine (LANTUS) 100 UNIT/ML injection  . labetalol (NORMODYNE) 100 MG tablet  . leflunomide (ARAVA) 10 MG tablet  . niacin (NIASPAN) 500 MG CR tablet  . nitroGLYCERIN (NITROSTAT) 0.4 MG SL tablet  . nortriptyline (PAMELOR) 10 MG capsule  . ONE TOUCH ULTRA TEST test strip  . oxyCODONE-acetaminophen (PERCOCET) 5-325 MG tablet  . polyvinyl alcohol (TEARS AGAIN) 1.4 % ophthalmic solution  . PRISTIQ 100 MG 24 hr tablet  . rosuvastatin (CRESTOR) 20 MG tablet  . torsemide (DEMADEX) 20 MG tablet   No current facility-administered medications  for this encounter.    Derrill Memo ON 11/13/2018] cefUROXime (ZINACEF) 1.5 g in sodium chloride 0.9 % 100 mL IVPB  . [START ON 11/13/2018] dexmedetomidine (PRECEDEX) 400 MCG/100ML (4 mcg/mL) infusion  . [START ON 11/13/2018] heparin 30,000 units/NS 1000 mL solution for CELLSAVER  . [START ON 11/13/2018] magnesium sulfate (IV Push/IM) injection 40 mEq  . [START ON 11/13/2018] norepinephrine (LEVOPHED) 4mg  in 238mL premix infusion  . [START ON 11/13/2018] potassium chloride injection 80 mEq  . [START ON 11/13/2018] vancomycin (VANCOCIN) 1,250 mg in sodium chloride 0.9 % 250 mL IVPB    Myra Gianotti, PA-C Surgical Short Stay/Anesthesiology [facility] Phone [phone] [facility] Phone [phone] 11/12/2018 12:07 PM

## 2018-11-13 ENCOUNTER — Encounter (HOSPITAL_COMMUNITY): Payer: Self-pay

## 2018-11-13 ENCOUNTER — Inpatient Hospital Stay (HOSPITAL_COMMUNITY): Payer: Medicare Other | Admitting: Vascular Surgery

## 2018-11-13 ENCOUNTER — Other Ambulatory Visit: Payer: Self-pay

## 2018-11-13 ENCOUNTER — Inpatient Hospital Stay (HOSPITAL_COMMUNITY): Payer: Medicare Other

## 2018-11-13 ENCOUNTER — Inpatient Hospital Stay (HOSPITAL_COMMUNITY)
Admission: RE | Admit: 2018-11-13 | Discharge: 2018-11-14 | DRG: 266 | Disposition: A | Discharge status: Home / Self Care | Payer: Medicare Other | Attending: Thoracic Surgery (Cardiothoracic Vascular Surgery) | Admitting: Thoracic Surgery (Cardiothoracic Vascular Surgery)

## 2018-11-13 ENCOUNTER — Encounter (HOSPITAL_COMMUNITY)
Admission: RE | Disposition: A | Discharge status: Home / Self Care | Payer: Self-pay | Source: Home / Self Care | Attending: Thoracic Surgery (Cardiothoracic Vascular Surgery)

## 2018-11-13 ENCOUNTER — Inpatient Hospital Stay (HOSPITAL_COMMUNITY): Payer: Medicare Other | Admitting: Anesthesiology

## 2018-11-13 DIAGNOSIS — E785 Hyperlipidemia, unspecified: Secondary | ICD-10-CM | POA: Diagnosis present

## 2018-11-13 DIAGNOSIS — Z7982 Long term (current) use of aspirin: Secondary | ICD-10-CM | POA: Diagnosis not present

## 2018-11-13 DIAGNOSIS — I25119 Atherosclerotic heart disease of native coronary artery with unspecified angina pectoris: Secondary | ICD-10-CM | POA: Diagnosis not present

## 2018-11-13 DIAGNOSIS — Z9861 Coronary angioplasty status: Secondary | ICD-10-CM

## 2018-11-13 DIAGNOSIS — Z794 Long term (current) use of insulin: Secondary | ICD-10-CM | POA: Diagnosis not present

## 2018-11-13 DIAGNOSIS — E669 Obesity, unspecified: Secondary | ICD-10-CM | POA: Diagnosis present

## 2018-11-13 DIAGNOSIS — N189 Chronic kidney disease, unspecified: Secondary | ICD-10-CM | POA: Diagnosis present

## 2018-11-13 DIAGNOSIS — Z952 Presence of prosthetic heart valve: Secondary | ICD-10-CM | POA: Diagnosis not present

## 2018-11-13 DIAGNOSIS — E11319 Type 2 diabetes mellitus with unspecified diabetic retinopathy without macular edema: Secondary | ICD-10-CM | POA: Diagnosis present

## 2018-11-13 DIAGNOSIS — I5033 Acute on chronic diastolic (congestive) heart failure: Secondary | ICD-10-CM | POA: Diagnosis present

## 2018-11-13 DIAGNOSIS — I700 Atherosclerosis of aorta: Secondary | ICD-10-CM | POA: Diagnosis present

## 2018-11-13 DIAGNOSIS — Z90710 Acquired absence of both cervix and uterus: Secondary | ICD-10-CM

## 2018-11-13 DIAGNOSIS — I350 Nonrheumatic aortic (valve) stenosis: Secondary | ICD-10-CM | POA: Diagnosis present

## 2018-11-13 DIAGNOSIS — N184 Chronic kidney disease, stage 4 (severe): Secondary | ICD-10-CM | POA: Diagnosis present

## 2018-11-13 DIAGNOSIS — Z9049 Acquired absence of other specified parts of digestive tract: Secondary | ICD-10-CM | POA: Diagnosis not present

## 2018-11-13 DIAGNOSIS — Z79899 Other long term (current) drug therapy: Secondary | ICD-10-CM | POA: Diagnosis not present

## 2018-11-13 DIAGNOSIS — N289 Disorder of kidney and ureter, unspecified: Secondary | ICD-10-CM

## 2018-11-13 DIAGNOSIS — I2510 Atherosclerotic heart disease of native coronary artery without angina pectoris: Secondary | ICD-10-CM | POA: Diagnosis present

## 2018-11-13 DIAGNOSIS — Z7902 Long term (current) use of antithrombotics/antiplatelets: Secondary | ICD-10-CM | POA: Diagnosis not present

## 2018-11-13 DIAGNOSIS — Z8673 Personal history of transient ischemic attack (TIA), and cerebral infarction without residual deficits: Secondary | ICD-10-CM | POA: Diagnosis not present

## 2018-11-13 DIAGNOSIS — I5043 Acute on chronic combined systolic (congestive) and diastolic (congestive) heart failure: Secondary | ICD-10-CM

## 2018-11-13 DIAGNOSIS — I272 Pulmonary hypertension, unspecified: Secondary | ICD-10-CM | POA: Diagnosis present

## 2018-11-13 DIAGNOSIS — Z006 Encounter for examination for normal comparison and control in clinical research program: Secondary | ICD-10-CM

## 2018-11-13 DIAGNOSIS — Z8042 Family history of malignant neoplasm of prostate: Secondary | ICD-10-CM

## 2018-11-13 DIAGNOSIS — I10 Essential (primary) hypertension: Secondary | ICD-10-CM | POA: Diagnosis present

## 2018-11-13 DIAGNOSIS — E1122 Type 2 diabetes mellitus with diabetic chronic kidney disease: Secondary | ICD-10-CM | POA: Diagnosis present

## 2018-11-13 DIAGNOSIS — E1142 Type 2 diabetes mellitus with diabetic polyneuropathy: Secondary | ICD-10-CM | POA: Diagnosis present

## 2018-11-13 DIAGNOSIS — I130 Hypertensive heart and chronic kidney disease with heart failure and stage 1 through stage 4 chronic kidney disease, or unspecified chronic kidney disease: Secondary | ICD-10-CM | POA: Diagnosis present

## 2018-11-13 DIAGNOSIS — Z833 Family history of diabetes mellitus: Secondary | ICD-10-CM

## 2018-11-13 DIAGNOSIS — K219 Gastro-esophageal reflux disease without esophagitis: Secondary | ICD-10-CM | POA: Diagnosis present

## 2018-11-13 DIAGNOSIS — I25118 Atherosclerotic heart disease of native coronary artery with other forms of angina pectoris: Secondary | ICD-10-CM | POA: Diagnosis present

## 2018-11-13 DIAGNOSIS — Z8249 Family history of ischemic heart disease and other diseases of the circulatory system: Secondary | ICD-10-CM

## 2018-11-13 DIAGNOSIS — N179 Acute kidney failure, unspecified: Secondary | ICD-10-CM | POA: Diagnosis present

## 2018-11-13 HISTORY — DX: Nonrheumatic aortic (valve) stenosis: I35.0

## 2018-11-13 HISTORY — DX: Disorder of kidney and ureter, unspecified: N28.9

## 2018-11-13 HISTORY — DX: Presence of prosthetic heart valve: Z95.2

## 2018-11-13 HISTORY — DX: Personal history of transient ischemic attack (TIA), and cerebral infarction without residual deficits: Z86.73

## 2018-11-13 HISTORY — PX: TRANSCATHETER AORTIC VALVE REPLACEMENT, TRANSFEMORAL: SHX6400

## 2018-11-13 HISTORY — PX: TEE WITHOUT CARDIOVERSION: SHX5443

## 2018-11-13 LAB — POCT I-STAT, CHEM 8
BUN: 33 mg/dL — ABNORMAL HIGH (ref 8–23)
BUN: 33 mg/dL — ABNORMAL HIGH (ref 8–23)
BUN: 33 mg/dL — ABNORMAL HIGH (ref 8–23)
Calcium, Ion: 1.26 mmol/L (ref 1.15–1.40)
Calcium, Ion: 1.22 mmol/L (ref 1.15–1.40)
Calcium, Ion: 1.26 mmol/L (ref 1.15–1.40)
Chloride: 105 mmol/L (ref 98–111)
Chloride: 106 mmol/L (ref 98–111)
Chloride: 106 mmol/L (ref 98–111)
Creatinine, Ser: 2.9 mg/dL — ABNORMAL HIGH (ref 0.44–1.00)
Creatinine, Ser: 2.9 mg/dL — ABNORMAL HIGH (ref 0.44–1.00)
Creatinine, Ser: 2.9 mg/dL — ABNORMAL HIGH (ref 0.44–1.00)
Glucose, Bld: 106 mg/dL — ABNORMAL HIGH (ref 70–99)
Glucose, Bld: 121 mg/dL — ABNORMAL HIGH (ref 70–99)
Glucose, Bld: 135 mg/dL — ABNORMAL HIGH (ref 70–99)
HCT: 32 % — ABNORMAL LOW (ref 36.0–46.0)
HCT: 28 % — ABNORMAL LOW (ref 36.0–46.0)
HCT: 28 % — ABNORMAL LOW (ref 36.0–46.0)
Hemoglobin: 10.9 g/dL — ABNORMAL LOW (ref 12.0–15.0)
Hemoglobin: 9.5 g/dL — ABNORMAL LOW (ref 12.0–15.0)
Hemoglobin: 9.5 g/dL — ABNORMAL LOW (ref 12.0–15.0)
Potassium: 3.8 mmol/L (ref 3.5–5.1)
Potassium: 3.9 mmol/L (ref 3.5–5.1)
Potassium: 4.1 mmol/L (ref 3.5–5.1)
Sodium: 139 mmol/L (ref 135–145)
Sodium: 139 mmol/L (ref 135–145)
Sodium: 140 mmol/L (ref 135–145)
TCO2: 22 mmol/L (ref 22–32)
TCO2: 22 mmol/L (ref 22–32)
TCO2: 23 mmol/L (ref 22–32)

## 2018-11-13 LAB — GLUCOSE, CAPILLARY
Glucose-Capillary: 89 mg/dL (ref 70–99)
Glucose-Capillary: 135 mg/dL — ABNORMAL HIGH (ref 70–99)
Glucose-Capillary: 233 mg/dL — ABNORMAL HIGH (ref 70–99)
Glucose-Capillary: 130 mg/dL — ABNORMAL HIGH (ref 70–99)

## 2018-11-13 SURGERY — IMPLANTATION, AORTIC VALVE, TRANSCATHETER, FEMORAL APPROACH
Anesthesia: Monitor Anesthesia Care | Site: Chest

## 2018-11-13 MED ORDER — HEPARIN SODIUM (PORCINE) 1000 UNIT/ML IJ SOLN
INTRAMUSCULAR | Status: DC | PRN
  Administered 2018-11-13: 11000 [IU] via INTRAVENOUS

## 2018-11-13 MED ORDER — SODIUM CHLORIDE 0.9 % IV SOLN
INTRAVENOUS | Status: DC
  Administered 2018-11-13: 07:00:00 via INTRAVENOUS

## 2018-11-13 MED ORDER — ONDANSETRON HCL 4 MG/2ML IJ SOLN: 4.0000 mg | Freq: Four times a day (QID) | INTRAMUSCULAR | Status: DC | PRN

## 2018-11-13 MED ORDER — SODIUM CHLORIDE 0.9 % IV SOLN: INTRAVENOUS | Status: AC

## 2018-11-13 MED ORDER — ACETAMINOPHEN 325 MG PO TABS
650.0000 mg | ORAL_TABLET | Freq: Four times a day (QID) | ORAL | Status: DC | PRN
  Administered 2018-11-14: 650 mg via ORAL
  Filled 2018-11-13: qty 2

## 2018-11-13 MED ORDER — FENTANYL CITRATE (PF) 250 MCG/5ML IJ SOLN
INTRAMUSCULAR | Status: AC
  Filled 2018-11-13: qty 5

## 2018-11-13 MED ORDER — INSULIN ASPART 100 UNIT/ML ~~LOC~~ SOLN
0.0000 [IU] | Freq: Three times a day (TID) | SUBCUTANEOUS | Status: DC
  Administered 2018-11-13: 8 [IU] via SUBCUTANEOUS
  Administered 2018-11-13 (×2): 2 [IU] via SUBCUTANEOUS

## 2018-11-13 MED ORDER — NIACIN ER (ANTIHYPERLIPIDEMIC) 500 MG PO TBCR
500.0000 mg | EXTENDED_RELEASE_TABLET | Freq: Every day | ORAL | Status: DC
  Administered 2018-11-14: 500 mg via ORAL
  Filled 2018-11-13: qty 1

## 2018-11-13 MED ORDER — VANCOMYCIN HCL IN DEXTROSE 1-5 GM/200ML-% IV SOLN
1000.0000 mg | Freq: Once | INTRAVENOUS | Status: AC
  Administered 2018-11-13: 1000 mg via INTRAVENOUS
  Filled 2018-11-13: qty 200

## 2018-11-13 MED ORDER — SODIUM CHLORIDE 0.9 % IV SOLN: 250.0000 mL | INTRAVENOUS | Status: DC | PRN

## 2018-11-13 MED ORDER — MIDAZOLAM HCL 5 MG/5ML IJ SOLN
INTRAMUSCULAR | Status: DC | PRN
  Administered 2018-11-13: 1 mg via INTRAVENOUS

## 2018-11-13 MED ORDER — CLOPIDOGREL BISULFATE 75 MG PO TABS
75.0000 mg | ORAL_TABLET | Freq: Every day | ORAL | Status: DC
  Administered 2018-11-14: 75 mg via ORAL
  Filled 2018-11-13: qty 1

## 2018-11-13 MED ORDER — TRAMADOL HCL 50 MG PO TABS: 50.0000 mg | ORAL_TABLET | ORAL | Status: DC | PRN

## 2018-11-13 MED ORDER — VENLAFAXINE HCL ER 75 MG PO CP24
150.0000 mg | ORAL_CAPSULE | Freq: Every day | ORAL | Status: DC
  Administered 2018-11-14: 150 mg via ORAL
  Filled 2018-11-13: qty 2

## 2018-11-13 MED ORDER — MORPHINE SULFATE (PF) 2 MG/ML IV SOLN: 1.0000 mg | INTRAVENOUS | Status: DC | PRN

## 2018-11-13 MED ORDER — LEFLUNOMIDE 20 MG PO TABS
10.0000 mg | ORAL_TABLET | Freq: Every day | ORAL | Status: DC
  Administered 2018-11-14: 10 mg via ORAL
  Filled 2018-11-13: qty 0.5

## 2018-11-13 MED ORDER — SODIUM CHLORIDE 0.9% FLUSH: 3.0000 mL | INTRAVENOUS | Status: DC | PRN

## 2018-11-13 MED ORDER — FOLIC ACID 1 MG PO TABS
1.0000 mg | ORAL_TABLET | Freq: Every day | ORAL | Status: DC
  Administered 2018-11-14: 1 mg via ORAL
  Filled 2018-11-13: qty 1

## 2018-11-13 MED ORDER — CHLORHEXIDINE GLUCONATE 4 % EX LIQD: 60.0000 mL | Freq: Once | CUTANEOUS | Status: DC

## 2018-11-13 MED ORDER — NITROGLYCERIN IN D5W 200-5 MCG/ML-% IV SOLN: 0.0000 ug/min | INTRAVENOUS | Status: DC

## 2018-11-13 MED ORDER — LIDOCAINE HCL 1 % IJ SOLN
INTRAMUSCULAR | Status: AC
  Filled 2018-11-13: qty 20

## 2018-11-13 MED ORDER — DEXMEDETOMIDINE HCL IN NACL 200 MCG/50ML IV SOLN
INTRAVENOUS | Status: DC | PRN
  Administered 2018-11-13: 72.6 ug via INTRAVENOUS

## 2018-11-13 MED ORDER — SODIUM CHLORIDE 0.9 % IV SOLN
INTRAVENOUS | Status: DC | PRN
  Administered 2018-11-13: 1500 mL via INTRAMUSCULAR

## 2018-11-13 MED ORDER — IODIXANOL 320 MG/ML IV SOLN
INTRAVENOUS | Status: DC | PRN
  Administered 2018-11-13: 28 mL

## 2018-11-13 MED ORDER — SODIUM CHLORIDE 0.9 % IV SOLN
1.5000 g | INTRAVENOUS | Status: DC
  Filled 2018-11-13: qty 1.5

## 2018-11-13 MED ORDER — OXYCODONE HCL 5 MG PO TABS: 5.0000 mg | ORAL_TABLET | ORAL | Status: DC | PRN

## 2018-11-13 MED ORDER — CHLORHEXIDINE GLUCONATE 0.12 % MT SOLN
15.0000 mL | Freq: Once | OROMUCOSAL | Status: AC
  Administered 2018-11-13: 15 mL via OROMUCOSAL
  Filled 2018-11-13: qty 15

## 2018-11-13 MED ORDER — CHLORHEXIDINE GLUCONATE 4 % EX LIQD: 30.0000 mL | CUTANEOUS | Status: DC

## 2018-11-13 MED ORDER — MIDAZOLAM HCL 2 MG/2ML IJ SOLN
INTRAMUSCULAR | Status: AC
  Filled 2018-11-13: qty 2

## 2018-11-13 MED ORDER — SODIUM CHLORIDE 0.9 % IV SOLN
1.5000 g | Freq: Two times a day (BID) | INTRAVENOUS | Status: DC
  Administered 2018-11-13: 1.5 g via INTRAVENOUS
  Filled 2018-11-13 (×2): qty 1.5

## 2018-11-13 MED ORDER — SODIUM CHLORIDE 0.9% FLUSH
3.0000 mL | Freq: Two times a day (BID) | INTRAVENOUS | Status: DC
  Administered 2018-11-13 – 2018-11-14 (×2): 3 mL via INTRAVENOUS

## 2018-11-13 MED ORDER — CALCITRIOL 0.25 MCG PO CAPS
0.2500 ug | ORAL_CAPSULE | Freq: Every day | ORAL | Status: DC
  Administered 2018-11-14: 0.25 ug via ORAL
  Filled 2018-11-13: qty 1

## 2018-11-13 MED ORDER — SODIUM CHLORIDE 0.9 % IV SOLN
INTRAVENOUS | Status: AC
  Filled 2018-11-13 (×3): qty 1.2

## 2018-11-13 MED ORDER — PROPOFOL 10 MG/ML IV BOLUS
INTRAVENOUS | Status: AC
  Filled 2018-11-13: qty 20

## 2018-11-13 MED ORDER — FENTANYL CITRATE (PF) 100 MCG/2ML IJ SOLN
INTRAMUSCULAR | Status: DC | PRN
  Administered 2018-11-13: 50 ug via INTRAVENOUS

## 2018-11-13 MED ORDER — SODIUM CHLORIDE 0.9 % IV SOLN
INTRAVENOUS | Status: DC | PRN
  Administered 2018-11-13: 07:00:00 via INTRAVENOUS

## 2018-11-13 MED ORDER — ASPIRIN EC 81 MG PO TBEC
81.0000 mg | DELAYED_RELEASE_TABLET | Freq: Every day | ORAL | Status: DC
  Administered 2018-11-13 – 2018-11-14 (×2): 81 mg via ORAL
  Filled 2018-11-13 (×2): qty 1

## 2018-11-13 MED ORDER — NORTRIPTYLINE HCL 10 MG PO CAPS
10.0000 mg | ORAL_CAPSULE | Freq: Every day | ORAL | Status: DC
  Administered 2018-11-13 – 2018-11-14 (×2): 10 mg via ORAL
  Filled 2018-11-13 (×2): qty 1

## 2018-11-13 MED ORDER — PHENYLEPHRINE HCL-NACL 20-0.9 MG/250ML-% IV SOLN
0.0000 ug/min | INTRAVENOUS | Status: DC
  Filled 2018-11-13: qty 250

## 2018-11-13 MED ORDER — LIDOCAINE HCL 1 % IJ SOLN
INTRAMUSCULAR | Status: DC | PRN
  Administered 2018-11-13: 6 mL

## 2018-11-13 MED ORDER — PROPOFOL 10 MG/ML IV BOLUS
INTRAVENOUS | Status: DC | PRN
  Administered 2018-11-13: 10 mg via INTRAVENOUS

## 2018-11-13 MED ORDER — PROPOFOL 500 MG/50ML IV EMUL
INTRAVENOUS | Status: DC | PRN
  Administered 2018-11-13: 10 ug/kg/min via INTRAVENOUS

## 2018-11-13 MED ORDER — ONDANSETRON HCL 4 MG/2ML IJ SOLN
INTRAMUSCULAR | Status: DC | PRN
  Administered 2018-11-13: 4 mg via INTRAVENOUS

## 2018-11-13 MED ORDER — ROSUVASTATIN CALCIUM 20 MG PO TABS
20.0000 mg | ORAL_TABLET | Freq: Every day | ORAL | Status: DC
  Administered 2018-11-14: 20 mg via ORAL
  Filled 2018-11-13: qty 1

## 2018-11-13 MED ORDER — ALLOPURINOL 100 MG PO TABS
50.0000 mg | ORAL_TABLET | Freq: Every day | ORAL | Status: DC
  Administered 2018-11-13 – 2018-11-14 (×2): 50 mg via ORAL
  Filled 2018-11-13 (×2): qty 1

## 2018-11-13 MED ORDER — ACETAMINOPHEN 650 MG RE SUPP: 650.0000 mg | Freq: Four times a day (QID) | RECTAL | Status: DC | PRN

## 2018-11-13 MED ORDER — PROTAMINE SULFATE 10 MG/ML IV SOLN
INTRAVENOUS | Status: DC | PRN
  Administered 2018-11-13: 110 mg via INTRAVENOUS

## 2018-11-13 SURGICAL SUPPLY — 84 items
BAG DECANTER FOR FLEXI CONT (MISCELLANEOUS) IMPLANT
BAG SNAP BAND KOVER 36X36 (MISCELLANEOUS) ×3 IMPLANT
BLADE CLIPPER SURG (BLADE) IMPLANT
BLADE OSCILLATING /SAGITTAL (BLADE) IMPLANT
BLADE STERNUM SYSTEM 6 (BLADE) IMPLANT
BLADE SURG 10 STRL SS (BLADE) IMPLANT
CABLE ADAPT CONN TEMP 6FT (ADAPTER) ×3 IMPLANT
CATH DIAG EXPO 6F AL1 (CATHETERS) ×1 IMPLANT
CATH DIAG EXPO 6F VENT PIG 145 (CATHETERS) ×6 IMPLANT
CATH EXTERNAL FEMALE PUREWICK (CATHETERS) IMPLANT
CATH INFINITI 6F AL2 (CATHETERS) IMPLANT
CATH S G BIP PACING (CATHETERS) ×3 IMPLANT
CHLORAPREP W/TINT 26 (MISCELLANEOUS) ×3 IMPLANT
CLIP VESOCCLUDE MED 24/CT (CLIP) IMPLANT
CLIP VESOCCLUDE SM WIDE 24/CT (CLIP) IMPLANT
CLOSURE MYNX CONTROL 6F/7F (Vascular Products) ×1 IMPLANT
CONT SPEC 4OZ CLIKSEAL STRL BL (MISCELLANEOUS) ×7 IMPLANT
COVER BACK TABLE 80X110 HD (DRAPES) ×3 IMPLANT
COVER WAND RF STERILE (DRAPES) ×2 IMPLANT
DECANTER SPIKE VIAL GLASS SM (MISCELLANEOUS) ×3 IMPLANT
DERMABOND ADVANCED (GAUZE/BANDAGES/DRESSINGS) ×2
DERMABOND ADVANCED .7 DNX12 (GAUZE/BANDAGES/DRESSINGS) ×2 IMPLANT
DEVICE CLOSURE PERCLS PRGLD 6F (VASCULAR PRODUCTS) ×4 IMPLANT
DRAPE INCISE IOBAN 66X45 STRL (DRAPES) IMPLANT
DRSG TEGADERM 4X4.75 (GAUZE/BANDAGES/DRESSINGS) ×6 IMPLANT
ELECT CAUTERY BLADE 6.4 (BLADE) IMPLANT
ELECT REM PT RETURN 9FT ADLT (ELECTROSURGICAL) ×3
ELECTRODE REM PT RTRN 9FT ADLT (ELECTROSURGICAL) ×4 IMPLANT
FELT TEFLON 6X6 (MISCELLANEOUS) IMPLANT
GAUZE 4X4 16PLY RFD (DISPOSABLE) ×1 IMPLANT
GAUZE SPONGE 2X2 8PLY STRL LF (GAUZE/BANDAGES/DRESSINGS) IMPLANT
GAUZE SPONGE 4X4 12PLY STRL (GAUZE/BANDAGES/DRESSINGS) ×3 IMPLANT
GLOVE BIO SURGEON STRL SZ7.5 (GLOVE) ×3 IMPLANT
GLOVE BIO SURGEON STRL SZ8 (GLOVE) IMPLANT
GLOVE EUDERMIC 7 POWDERFREE (GLOVE) IMPLANT
GLOVE ORTHO TXT STRL SZ7.5 (GLOVE) IMPLANT
GOWN STRL REUS W/ TWL LRG LVL3 (GOWN DISPOSABLE) IMPLANT
GOWN STRL REUS W/ TWL XL LVL3 (GOWN DISPOSABLE) ×2 IMPLANT
GOWN STRL REUS W/TWL LRG LVL3 (GOWN DISPOSABLE) ×4
GOWN STRL REUS W/TWL XL LVL3 (GOWN DISPOSABLE) ×1
GUIDEWIRE SAFE TJ AMPLATZ EXST (WIRE) ×3 IMPLANT
INSERT FOGARTY SM (MISCELLANEOUS) IMPLANT
KIT BASIN OR (CUSTOM PROCEDURE TRAY) ×3 IMPLANT
KIT HEART LEFT (KITS) ×3 IMPLANT
KIT SUCTION CATH 14FR (SUCTIONS) IMPLANT
KIT TURNOVER KIT B (KITS) ×3 IMPLANT
LOOP VESSEL MAXI BLUE (MISCELLANEOUS) IMPLANT
LOOP VESSEL MINI RED (MISCELLANEOUS) IMPLANT
NS IRRIG 1000ML POUR BTL (IV SOLUTION) ×3 IMPLANT
PACK ENDO MINOR (CUSTOM PROCEDURE TRAY) ×3 IMPLANT
PAD ARMBOARD 7.5X6 YLW CONV (MISCELLANEOUS) ×6 IMPLANT
PAD ELECT DEFIB RADIOL ZOLL (MISCELLANEOUS) ×3 IMPLANT
PENCIL BUTTON HOLSTER BLD 10FT (ELECTRODE) IMPLANT
PERCLOSE PROGLIDE 6F (VASCULAR PRODUCTS) ×6
POSITIONER HEAD DONUT 9IN (MISCELLANEOUS) ×3 IMPLANT
SET MICROPUNCTURE 5F STIFF (MISCELLANEOUS) ×3 IMPLANT
SHEATH BRITE TIP 7FR 35CM (SHEATH) ×3 IMPLANT
SHEATH PINNACLE 6F 10CM (SHEATH) ×3 IMPLANT
SHEATH PINNACLE 8F 10CM (SHEATH) ×3 IMPLANT
SLEEVE REPOSITIONING LENGTH 30 (MISCELLANEOUS) ×3 IMPLANT
SPONGE GAUZE 2X2 STER 10/PKG (GAUZE/BANDAGES/DRESSINGS) ×2
SPONGE LAP 18X18 RF (DISPOSABLE) ×1 IMPLANT
STOPCOCK MORSE 400PSI 3WAY (MISCELLANEOUS) ×6 IMPLANT
SUT ETHIBOND X763 2 0 SH 1 (SUTURE) IMPLANT
SUT GORETEX CV 4 TH 22 36 (SUTURE) IMPLANT
SUT GORETEX CV4 TH-18 (SUTURE) IMPLANT
SUT MNCRL AB 3-0 PS2 18 (SUTURE) IMPLANT
SUT PROLENE 5 0 C 1 36 (SUTURE) IMPLANT
SUT PROLENE 6 0 C 1 30 (SUTURE) IMPLANT
SUT SILK  1 MH (SUTURE) ×1
SUT SILK 1 MH (SUTURE) ×2 IMPLANT
SUT VIC AB 2-0 CT1 27 (SUTURE)
SUT VIC AB 2-0 CT1 TAPERPNT 27 (SUTURE) IMPLANT
SUT VIC AB 2-0 CTX 36 (SUTURE) IMPLANT
SUT VIC AB 3-0 SH 8-18 (SUTURE) IMPLANT
SYR 50ML LL SCALE MARK (SYRINGE) ×3 IMPLANT
SYR BULB IRRIGATION 50ML (SYRINGE) IMPLANT
SYR MEDRAD MARK V 150ML (SYRINGE) ×3 IMPLANT
TOWEL GREEN STERILE (TOWEL DISPOSABLE) ×6 IMPLANT
TRANSDUCER W/STOPCOCK (MISCELLANEOUS) ×6 IMPLANT
TRAY FOLEY SLVR 16FR TEMP STAT (SET/KITS/TRAYS/PACK) IMPLANT
VALVE 23 ULTRA SAPIEN KIT (Valve) ×1 IMPLANT
WIRE EMERALD 3MM-J .035X150CM (WIRE) ×3 IMPLANT
WIRE EMERALD 3MM-J .035X260CM (WIRE) ×3 IMPLANT

## 2018-11-13 NOTE — Anesthesia Procedure Notes (Signed)
Arterial Line Insertion Start/End10/27/2020 7:00 AM Performed by: Candis Shine, CRNA, CRNA  Patient location: Pre-op. Preanesthetic checklist: patient identified, IV checked, site marked, risks and benefits discussed, surgical consent, monitors and equipment checked, pre-op evaluation, timeout performed and anesthesia consent Lidocaine 1% used for infiltration Right, radial was placed Catheter size: 20 G Hand hygiene performed  and maximum sterile barriers used   Attempts: 1 Procedure performed without using ultrasound guided technique. Following insertion, dressing applied and Biopatch. Post procedure assessment: normal and unchanged  Patient tolerated the procedure well with no immediate complications.

## 2018-11-13 NOTE — Progress Notes (Signed)
Dr. Camillia Herter office called and made aware of UA results and that patient took blood pressure medicines this morning, per Dr. Angelena Form. Per the office they are aware of both.

## 2018-11-13 NOTE — CV Procedure (Signed)
[facility]  TAVR OPERATIVE NOTE   Date of Procedure:  11/13/2018  Preoperative Diagnosis: Severe Aortic Stenosis   Postoperative Diagnosis: Same   Procedure:    Transcatheter Aortic Valve Replacement - Transfemoral Approach  Edwards Sapien 3 THV (size 23 mm, model #[id number], serial # [id number])   Co-Surgeons:  Lauree Chandler, MD and Valentina Gu. [name]  Anesthesiologist:  Hodierne  Echocardiographer:  Croitoru  Pre-operative Echo Findings:  Severe aortic stenosis  Normal left ventricular systolic function  Post-operative Echo Findings:  No paravalvular leak  Normal left ventricular systolic function  BRIEF CLINICAL NOTE AND INDICATIONS FOR SURGERY  78 yo female with history of stage 4 chronic kidney disease, DM, CAD, hyperlipidemia, GERD, prior CVA, HTN, diverticulitis s/p partial colectomy and severe aortic stenosis who is here today for TAVR. She is known to have CAD. A drug eluting stent was placed in the RCA in December 2015. Cardiac catheterization 10/23/18 with moderate ostial RCA stenosis, no obstructive disease in the left main artery, LAD or Circumflex. The distal RCA stent is patent. She has had several strokes, most recently in August 2020 with visual loss and gain instability following the stroke. She has stage 4 chronic kidney disease. She has stage 4 chronic kidney disease with creatinine of 3.0-3.6 with GFR less than 20. She has a left upper extremity fistula. She has been followed for aortic stenosis. Most recent echo 09/10/18 with normal LV systolic function, RXVQ=00-86%. There is severe asymmetric left ventricular hypertrophy. The aortic valve leaflets are thickened and calcified. Mean gradient 40 mmHg, peak gradient 63 mHg, AVA 0.42 cm2, dimensionless index 0.23. This is consistent with severe aortic stenosis. There is moderate aortic valve insufficiency. There is mild mitral valve regurgitation.   During the course of the patient's  preoperative work up they have been evaluated comprehensively by a multidisciplinary team of specialists coordinated through the [facility] in the [facility].  They have been demonstrated to suffer from symptomatic severe aortic stenosis as noted above. The patient has been counseled extensively as to the relative risks and benefits of all options for the treatment of severe aortic stenosis including long term medical therapy, conventional surgery for aortic valve replacement, and transcatheter aortic valve replacement.  The patient has been independently evaluated by Dr. [name] with CT surgery and they are felt to be at high risk for conventional surgical aortic valve replacement. The surgeon indicated the patient would be a poor candidate for conventional surgery. Based upon review of all of the patient's preoperative diagnostic tests they are felt to be candidate for transcatheter aortic valve replacement using the transfemoral approach as an alternative to high risk conventional surgery.    Following the decision to proceed with transcatheter aortic valve replacement, a discussion has been held regarding what types of management strategies would be attempted intraoperatively in the event of life-threatening complications, including whether or not the patient would be considered a candidate for the use of cardiopulmonary bypass and/or conversion to open sternotomy for attempted surgical intervention.  The patient has been advised of a variety of complications that might develop peculiar to this approach including but not limited to risks of death, stroke, paravalvular leak, aortic dissection or other major vascular complications, aortic annulus rupture, device embolization, cardiac rupture or perforation, acute myocardial infarction, arrhythmia, heart block or bradycardia requiring permanent pacemaker placement, congestive heart failure, respiratory failure,  renal failure, pneumonia, infection, other late complications related to structural valve deterioration or  migration, or other complications that might ultimately cause a temporary or permanent loss of functional independence or other long term morbidity.  The patient provides full informed consent for the procedure as described and all questions were answered preoperatively.    DETAILS OF THE OPERATIVE PROCEDURE  PREPARATION:   The patient is brought to the operating room on the above mentioned date and central monitoring was established by the anesthesia team including placement of a radial arterial line. The patient is placed in the supine position on the operating table.  Intravenous antibiotics are administered. Conscious sedation is used.   Baseline transthoracic echocardiogram was performed. The patient's chest, abdomen, both groins, and both lower extremities are prepared and draped in a sterile manner. A time out procedure is performed.   PERIPHERAL ACCESS:   Using the modified Seldinger technique, femoral arterial and venous access were obtained with placement of 6 Fr sheaths on the left side using u/s guidance.  A pigtail diagnostic catheter was passed through the femoral arterial sheath under fluoroscopic guidance into the aortic root.  A temporary transvenous pacemaker catheter was passed through the femoral venous sheath under fluoroscopic guidance into the right ventricle.  The pacemaker was tested to ensure stable lead placement and pacemaker capture. Aortic root angiography was performed in order to determine the optimal angiographic angle for valve deployment.  TRANSFEMORAL ACCESS:  A micropuncture kit was used to gain access to the right femoral artery using u/s guidance. Position confirmed with angiography. Pre-closure with double ProGlide closure devices. The patient was heparinized systemically and ACT verified > 250 seconds.    A 14 Fr transfemoral E-sheath was introduced  into the right femoral artery after progressively dilating over an Amplatz superstiff wire. An AL-1 catheter was used to direct a straight-tip exchange length wire across the native aortic valve into the left ventricle. This was exchanged out for a pigtail catheter and position was confirmed in the LV apex. Simultaneous LV and Ao pressures were recorded.  The pigtail catheter was then exchanged for an Amplatz Extra-stiff wire in the LV apex.   TRANSCATHETER HEART VALVE DEPLOYMENT:  An Edwards Sapien 3 THV (size 23 mm) was prepared and crimped per manufacturer's guidelines, and the proper orientation of the valve is confirmed on the [employer] delivery system. The valve was advanced through the introducer sheath using normal technique until in an appropriate position in the abdominal aorta beyond the sheath tip. The balloon was then retracted and using the fine-tuning wheel was centered on the valve. The valve was then advanced across the aortic arch using appropriate flexion of the catheter. The valve was carefully positioned across the aortic valve annulus. The Commander catheter was retracted using normal technique. Once final position of the valve has been confirmed by angiographic assessment, the valve is deployed while temporarily holding ventilation and during rapid ventricular pacing to maintain systolic blood pressure < 50 mmHg and pulse pressure < 10 mmHg. The balloon inflation is held for >3 seconds after reaching full deployment volume. Once the balloon has fully deflated the balloon is retracted into the ascending aorta and valve function is assessed using TTE. There is felt to be no paravalvular leak and no central aortic insufficiency.  The patient's hemodynamic recovery following valve deployment is good.  The deployment balloon and guidewire are both removed. Echo demostrated acceptable post-procedural gradients, stable mitral valve function, and no AI.   PROCEDURE COMPLETION:  The  sheath was then removed and closure devices were completed. Protamine was  administered once femoral arterial repair was complete. The temporary pacemaker, pigtail catheters and femoral sheaths were removed with a Mynx closure device placed in the right femoral artery. Manual pressure was used for venous hemostasis.    The patient tolerated the procedure well and is transported to the surgical intensive care in stable condition. There were no immediate intraoperative complications. All sponge instrument and needle counts are verified correct at completion of the operation.   No blood products were administered during the operation.  The patient received a total of 28 mL of intravenous contrast during the procedure.  Lauree Chandler MD 11/13/2018 9:12 AM

## 2018-11-13 NOTE — Discharge Instructions (Signed)
ACTIVITY AND EXERCISE °• Daily activity and exercise are an important part of your recovery. People recover at different rates depending on their general health and type of valve procedure. °• Most people recovering from TAVR feel better relatively quickly  °• No lifting, pushing, pulling more than 10 pounds (examples to avoid: groceries, vacuuming, gardening, golfing): °            - For one week with a procedure through the groin. °            - For six weeks for procedures through the chest wall or neck. °NOTE: You will typically see one of our providers 7-14 days after your procedure to discuss WHEN TO RESUME the above activities.  °  °  °DRIVING °• Do not drive until you are seen for follow up and cleared by a provider. Generally, we ask patient to not drive for 1 week after their procedure. °• If you have been told by your doctor in the past that you may not drive, you must talk with him/her before you begin driving again. °  °DRESSING °• Groin site: you may leave the clear dressing over the site for up to one week or until it falls off. °  °HYGIENE °• If you had a femoral (leg) procedure, you may take a shower when you return home. After the shower, pat the site dry. Do NOT use powder, oils or lotions in your groin area until the site has completely healed. °• If you had a chest procedure, you may shower when you return home unless specifically instructed not to by your discharging practitioner. °            - DO NOT scrub incision; pat dry with a towel. °            - DO NOT apply any lotions, oils, powders to the incision. °            - No tub baths / swimming for at least 2 weeks. °• If you notice any fevers, chills, increased pain, swelling, bleeding or pus, please contact your doctor. °  °ADDITIONAL INFORMATION °• If you are going to have an upcoming dental procedure, please contact our office as you will require antibiotics ahead of time to prevent infection on your heart valve.  ° ° °If you have any  questions or concerns you can call the structural heart phone during normal business hours 8am-4pm. If you have an urgent need after hours or weekends please call [phone] to talk to the on call provider for general cardiology. If you have an emergency that requires immediate attention, please call 911.  ° ° °After TAVR Checklist ° °Check  Test Description  ° Follow up appointment in 1-2 weeks  You will see our structural heart physician assistant, Katie Ramelo Oetken. Your incision sites will be checked and you will be cleared to drive and resume all normal activities if you are doing well.    ° 1 month echo and follow up  You will have an echo to check on your new heart valve and be seen back in the office by Katie Shion Bluestein. Many times the echo is not read by your appointment time, but Katie will call you later that day or the following day to report your results.  ° Follow up with your primary cardiologist You will need to be seen by your primary cardiologist in the following 3-6 months after your 1 month appointment in the valve   clinic. Often times your Plavix or Aspirin will be discontinued during this time, but this is decided on a case by case basis.   ° 1 year echo and follow up You will have another echo to check on your heart valve after 1 year and be seen back in the office by Katie Cimberly Stoffel. This your last structural heart visit.  ° Bacterial endocarditis prophylaxis  You will have to take antibiotics for the rest of your life before all dental procedures (even teeth cleanings) to protect your heart valve. Antibiotics are also required before some surgeries. Please check with your cardiologist before scheduling any surgeries. Also, please make sure to tell us if you have a penicillin allergy as you will require an alternative antibiotic.   ° ° °

## 2018-11-13 NOTE — Progress Notes (Signed)
Pt arrived to 4e from cath lab. Pt oriented to room and staff. Vitals obtained. Telemetry box applied and CCMD notified x2 verifiers. Bilateral groins level 0. Palpable pedal pulses bilaterally. Pt resting comfortably in bed at this time. Will continue to monitor.

## 2018-11-13 NOTE — Progress Notes (Signed)
  [facility] VALVE TEAM  Patient doing well s/p TAVR. She is hemodynamically stable. Groin sites stable. ECG with new 1st deg AV block and LBBB, but no high grade block. Arterial line discontinued and transferred to 4E. Plan for early ambulation after bedrest completed and hopeful discharge over the next 24-48 hours.   Angelena Form PA-C  [facility]  Pager [phone]

## 2018-11-13 NOTE — Interval H&P Note (Signed)
History and Physical Interval Note:  11/13/2018 6:09 AM  [name]  has presented today for surgery, with the diagnosis of Severe Aortic Stenosis.  The various methods of treatment have been discussed with the patient and family. After consideration of risks, benefits and other options for treatment, the patient has consented to  Procedure(s): TRANSCATHETER AORTIC VALVE REPLACEMENT, TRANSFEMORAL (N/A) TRANSESOPHAGEAL ECHOCARDIOGRAM (TEE) (N/A) as a surgical intervention.  The patient's history has been reviewed, patient examined, no change in status, stable for surgery.  I have reviewed the patient's chart and labs.  Questions were answered to the patient's satisfaction.     Rexene Alberts

## 2018-11-13 NOTE — Op Note (Signed)
[facility]   MULTIDISCIPLINARY HEART VALVE TEAM   TAVR OPERATIVE NOTE   Date of Procedure:  11/13/2018  Preoperative Diagnosis: Severe Aortic Stenosis   Postoperative Diagnosis: Same   Procedure:    Transcatheter Aortic Valve Replacement - Percutaneous Right Transfemoral Approach  Edwards Sapien 3 Ultra THV (size 23 mm, model # 9750TFX, serial # [id number])   Co-Surgeons:  Valentina Gu. [name], MD and Lauree Chandler, MD   Anesthesiologist:  Albertha Ghee, MD  Echocardiographer:  Sanda Klein, MD  Pre-operative Echo Findings:  Severe aortic stenosis  Normal left ventricular systolic function  Post-operative Echo Findings:  No paravalvular leak  Normal left ventricular systolic function   BRIEF CLINICAL NOTE AND INDICATIONS FOR SURGERY  Patient is a mildly obese 78 year old African-American female with history of aortic stenosis, coronary artery disease, hypertension, chronic diastolic congestive heart failure, stage IV chronic kidney disease, insulin-dependent diabetes mellitus with diabetic retinopathy and peripheral neuropathy, cerebrovascular disease with multiple previous strokes, GE reflux disease, diverticular disease status post partial colectomy, and hyperlipidemia who has been referred for surgical consultation to discuss treatment options for management of severe symptomatic aortic stenosis.  Patient states that she suffered her first stroke in 1999.  She has had a total of 3 in 2001 she underwent stenting of intracranial artery because of cerebrovascular disease.  She has been left with mild left-sided weakness as a residual from her most recent stroke, which was in August of this year.  Her cardiac history dates back more than 10 years and she has been followed by Dr. Einar Gip Dr. Virgina Jock.  Previous echocardiograms have documented the presence of normal left ventricular systolic function with aortic stenosis that has gradually progressed in  severity.  Over the past year the patient has experienced gradual progression of increased exercise tolerance and worsening symptoms of congestive heart failure.  Follow-up echocardiogram performed September 10, 2018 revealed normal left ventricular systolic function with severe aortic stenosis.  She underwent diagnostic cardiac catheterization revealing moderate ostial stenosis of the right coronary artery was otherwise nonobstructive coronary artery disease.  There was mild pulmonary hypertension.  The patient was referred to the multidisciplinary heart valve clinic and has been evaluated previously by Dr. Angelena Form.  CT angiography was performed and the patient referred for surgical consultation.  During the course of the patient's preoperative work up they have been evaluated comprehensively by a multidisciplinary team of specialists coordinated through the [facility] in the [facility].  They have been demonstrated to suffer from symptomatic severe aortic stenosis as noted above. The patient has been counseled extensively as to the relative risks and benefits of all options for the treatment of severe aortic stenosis including long term medical therapy, conventional surgery for aortic valve replacement, and transcatheter aortic valve replacement.  All questions have been answered, and the patient provides full informed consent for the operation as described.   DETAILS OF THE OPERATIVE PROCEDURE  PREPARATION:    The patient is brought to the operating room on the above mentioned date and appropriate monitoring was established by the anesthesia team. The patient is placed in the supine position on the operating table.  Intravenous antibiotics are administered. The patient is monitored closely throughout the procedure under conscious sedation.  Baseline transthoracic echocardiogram was performed. The patient's chest, abdomen, both groins, and both lower  extremities are prepared and draped in a sterile manner. A time out procedure is performed.   PERIPHERAL ACCESS:  Using the modified Seldinger technique, femoral arterial and venous access was obtained with placement of 6 Fr sheaths on the left side.  A pigtail diagnostic catheter was passed through the left arterial sheath under fluoroscopic guidance into the aortic root.  A temporary transvenous pacemaker catheter was passed through the left femoral venous sheath under fluoroscopic guidance into the right ventricle.  The pacemaker was tested to ensure stable lead placement and pacemaker capture. Aortic root angiography was performed in order to determine the optimal angiographic angle for valve deployment.   TRANSFEMORAL ACCESS:   Percutaneous transfemoral access and sheath placement was performed using ultrasound guidance.  The right common femoral artery was cannulated using a micropuncture needle and appropriate location was verified using hand injection angiogram.  A pair of Abbott Perclose percutaneous closure devices were placed and a 6 French sheath replaced into the femoral artery.  The patient was heparinized systemically and ACT verified > 250 seconds.    A 14 Fr transfemoral E-sheath was introduced into the right common femoral artery after progressively dilating over an Amplatz superstiff wire. An AL-1 catheter was used to direct a straight-tip exchange length wire across the native aortic valve into the left ventricle. This was exchanged out for a pigtail catheter and position was confirmed in the LV apex. Simultaneous LV and Ao pressures were recorded.  The pigtail catheter was exchanged for an Amplatz Extra-stiff wire in the LV apex.  Echocardiography was utilized to confirm appropriate wire position and no sign of entanglement in the mitral subvalvular apparatus.   TRANSCATHETER HEART VALVE DEPLOYMENT:   An Edwards Sapien 3 Ultra transcatheter heart valve (size 23 mm, model  #[id number], serial NM:[id number]) was prepared and crimped per manufacturer's guidelines, and the proper orientation of the valve is confirmed on the [employer] delivery system. The valve was advanced through the introducer sheath using normal technique until in an appropriate position in the abdominal aorta beyond the sheath tip. The balloon was then retracted and using the fine-tuning wheel was centered on the valve. The valve was then advanced across the aortic arch using appropriate flexion of the catheter. The valve was carefully positioned across the aortic valve annulus. The Commander catheter was retracted using normal technique. Once final position of the valve has been confirmed by angiographic assessment, the valve is deployed while temporarily holding ventilation and during rapid ventricular pacing to maintain systolic blood pressure < 50 mmHg and pulse pressure < 10 mmHg. The balloon inflation is held for >3 seconds after reaching full deployment volume. Once the balloon has fully deflated the balloon is retracted into the ascending aorta and valve function is assessed using echocardiography. There is felt to be no paravalvular leak and no central aortic insufficiency.  The patient's hemodynamic recovery following valve deployment is good.  The deployment balloon and guidewire are both removed.    PROCEDURE COMPLETION:   The sheath was removed and femoral artery closure performed.  Protamine was administered once femoral arterial repair was complete. The temporary pacemaker, pigtail catheters and femoral sheaths were removed with manual pressure used for hemostasis.  A Mynx femoral closure device was utilized following removal of the diagnostic sheath in the left femoral artery.  The patient tolerated the procedure well and is transported to the surgical intensive care in stable condition. There were no immediate intraoperative complications. All sponge instrument and needle counts are verified  correct at completion of the operation.   No blood products were administered during the operation.  The patient  received a total of 28 mL of intravenous contrast during the procedure.   Rexene Alberts, MD 11/13/2018 9:14 AM

## 2018-11-13 NOTE — Transfer of Care (Signed)
Immediate Anesthesia Transfer of Care Note  Patient: [name]  Procedure(s) Performed: TRANSCATHETER AORTIC VALVE REPLACEMENT, TRANSFEMORAL (N/A Chest) TRANSESOPHAGEAL ECHOCARDIOGRAM (TEE) (N/A )  Patient Location: Cath Lab  Anesthesia Type:MAC  Level of Consciousness: awake, alert  and oriented  Airway & Oxygen Therapy: Patient Spontanous Breathing and Patient connected to face mask oxygen  Post-op Assessment: Report given to RN and Post -op Vital signs reviewed and stable  Post vital signs: Reviewed and stable  Last Vitals:  Vitals Value Taken Time  BP 109/29 11/13/18 0929  Temp    Pulse 51 11/13/18 0931  Resp 20 11/13/18 0931  SpO2 100 % 11/13/18 0931  Vitals shown include unvalidated device data.  Last Pain:  Vitals:   11/13/18 0621  PainSc: 0-No pain      Patients Stated Pain Goal: 3 (01/41/03 0131)  Complications: No apparent anesthesia complications

## 2018-11-13 NOTE — Anesthesia Procedure Notes (Signed)
Procedure Name: MAC Date/Time: 11/13/2018 7:35 AM Performed by: Candis Shine, CRNA Pre-anesthesia Checklist: Patient identified, Suction available, Patient being monitored and Emergency Drugs available Patient Re-evaluated:Patient Re-evaluated prior to induction Oxygen Delivery Method: Simple face mask Dental Injury: Teeth and Oropharynx as per pre-operative assessment

## 2018-11-14 ENCOUNTER — Other Ambulatory Visit: Payer: Self-pay | Admitting: Cardiology

## 2018-11-14 ENCOUNTER — Other Ambulatory Visit: Payer: Self-pay | Admitting: Physician Assistant

## 2018-11-14 ENCOUNTER — Inpatient Hospital Stay (HOSPITAL_COMMUNITY): Payer: Medicare Other

## 2018-11-14 ENCOUNTER — Encounter (HOSPITAL_COMMUNITY): Payer: Self-pay | Admitting: General Practice

## 2018-11-14 DIAGNOSIS — Z952 Presence of prosthetic heart valve: Secondary | ICD-10-CM | POA: Diagnosis not present

## 2018-11-14 DIAGNOSIS — I5033 Acute on chronic diastolic (congestive) heart failure: Secondary | ICD-10-CM | POA: Diagnosis not present

## 2018-11-14 DIAGNOSIS — I350 Nonrheumatic aortic (valve) stenosis: Secondary | ICD-10-CM

## 2018-11-14 DIAGNOSIS — I130 Hypertensive heart and chronic kidney disease with heart failure and stage 1 through stage 4 chronic kidney disease, or unspecified chronic kidney disease: Secondary | ICD-10-CM | POA: Diagnosis not present

## 2018-11-14 DIAGNOSIS — N184 Chronic kidney disease, stage 4 (severe): Secondary | ICD-10-CM | POA: Diagnosis not present

## 2018-11-14 LAB — BASIC METABOLIC PANEL
Anion gap: 12 (ref 5–15)
BUN: 36 mg/dL — ABNORMAL HIGH (ref 8–23)
CO2: 20 mmol/L — ABNORMAL LOW (ref 22–32)
Calcium: 8.8 mg/dL — ABNORMAL LOW (ref 8.9–10.3)
Chloride: 105 mmol/L (ref 98–111)
Creatinine, Ser: 2.88 mg/dL — ABNORMAL HIGH (ref 0.44–1.00)
GFR calc Af Amer: 17 mL/min — ABNORMAL LOW (ref >60)
GFR calc non Af Amer: 15 mL/min — ABNORMAL LOW (ref >60)
Glucose, Bld: 92 mg/dL (ref 70–99)
Potassium: 4.5 mmol/L (ref 3.5–5.1)
Sodium: 137 mmol/L (ref 135–145)

## 2018-11-14 LAB — CBC
HCT: 30.9 % — ABNORMAL LOW (ref 36.0–46.0)
Hemoglobin: 9.9 g/dL — ABNORMAL LOW (ref 12.0–15.0)
MCH: 29.9 pg (ref 26.0–34.0)
MCHC: 32 g/dL (ref 30.0–36.0)
MCV: 93.4 fL (ref 80.0–100.0)
Platelets: 211 10*3/uL (ref 150–400)
RBC: 3.31 MIL/uL — ABNORMAL LOW (ref 3.87–5.11)
RDW: 14.3 % (ref 11.5–15.5)
WBC: 10.4 10*3/uL (ref 4.0–10.5)
nRBC: 0 % (ref 0.0–0.2)

## 2018-11-14 LAB — GLUCOSE, CAPILLARY
Glucose-Capillary: 90 mg/dL (ref 70–99)
Glucose-Capillary: 113 mg/dL — ABNORMAL HIGH (ref 70–99)

## 2018-11-14 LAB — ECHOCARDIOGRAM LIMITED
Height: 62.5 in
Weight: 2664 oz

## 2018-11-14 LAB — MAGNESIUM: Magnesium: 1.7 mg/dL (ref 1.7–2.4)

## 2018-11-14 MED ORDER — CLOPIDOGREL BISULFATE 75 MG PO TABS: 75.0000 mg | ORAL_TABLET | Freq: Every day | ORAL | 1 refills | Status: DC

## 2018-11-14 NOTE — Progress Notes (Signed)
CARDIAC REHAB PHASE I   PRE:  Rate/Rhythm: 76 SR  ? 1HB  BP:  Supine:   Sitting: 169/55  Standing:    SaO2: 100%RA  MODE:  Ambulation: 175 ft   POST:  Rate/Rhythm: 90 SR  BP:  Supine:   Sitting: 178/57  Standing:    SaO2: 97%RA 1025-1113 Pt walked 175 ft on RA with hand held asst. Has walker at home if needed. Wanted to try without walker this morning. Sats good on RA. Stopped a couple of time to rst standing. To recliner after walk. Pt follows renal diet so did not give diet ed. Pt told me foods that she eats and sounded very knowledgeable of diet. Encouraged walking as tolerated. Has attended CRP 2 before and would like to attend again. Referred to [place].    Graylon Good, RN BSN  11/14/2018 11:10 AM

## 2018-11-14 NOTE — Progress Notes (Signed)
Post-TAVR echo ordered

## 2018-11-14 NOTE — Anesthesia Postprocedure Evaluation (Signed)
Anesthesia Post Note  Patient: Dierdra Salameh Bundrick  Procedure(s) Performed: TRANSCATHETER AORTIC VALVE REPLACEMENT, TRANSFEMORAL (N/A Chest) TRANSESOPHAGEAL ECHOCARDIOGRAM (TEE) (N/A )     Patient location during evaluation: Cath Lab Anesthesia Type: MAC Level of consciousness: awake and alert Pain management: pain level controlled Vital Signs Assessment: post-procedure vital signs reviewed and stable Respiratory status: spontaneous breathing, nonlabored ventilation, respiratory function stable and patient connected to nasal cannula oxygen Cardiovascular status: stable and blood pressure returned to baseline Postop Assessment: no apparent nausea or vomiting Anesthetic complications: no    Last Vitals:  Vitals:   11/14/18 0407 11/14/18 0800  BP: (!) 145/46 (!) 147/45  Pulse:  69  Resp: 16 18  Temp: 37.4 C 36.7 C  SpO2: 99% 100%    Last Pain:  Vitals:   11/14/18 0800  TempSrc: Oral  PainSc: 0-No pain                 Swannie Milius S

## 2018-11-14 NOTE — Progress Notes (Signed)
  Echocardiogram 2D Echocardiogram has been performed.  Jennette Dubin 11/14/2018, 9:11 AM

## 2018-11-14 NOTE — Discharge Summary (Signed)
[facility]  Discharge Summary    Patient ID: [name] MRN: [id number]; DOB: 1940/05/02  Admit date: 11/13/2018 Discharge date: 11/14/2018  Primary Care Provider: Merrilee Seashore, MD  Primary Cardiologist: Dr. Virgina Jock / Dr. Angelena Form & Dr. [name] (TAVR)  Discharge Diagnoses    Principal Problem:   S/P TAVR (transcatheter aortic valve replacement) Active Problems:   History of cerebrovascular accident   CAD (coronary artery disease), native coronary artery   S/P PTCA (percutaneous transluminal coronary angioplasty)   Chronic kidney disease (CKD), stage IV (severe) (HCC)   Severe aortic stenosis   Hypertension   Hyperlipidemia   Renal lesion   Acute on chronic diastolic heart failure (HCC)   Allergies No Known Allergies  Diagnostic Studies/Procedures     TAVR OPERATIVE NOTE   Date of Procedure:                11/13/2018  Preoperative Diagnosis:      Severe Aortic Stenosis   Postoperative Diagnosis:    Same   Procedure:        Transcatheter Aortic Valve Replacement - Percutaneous Right Transfemoral Approach             Edwards Sapien 3 Ultra THV (size 23 mm, model # 9750TFX, serial # [id number])              Co-Surgeons:                        Valentina Gu. [name], MD and Lauree Chandler, MD   Anesthesiologist:                  Albertha Ghee, MD  Echocardiographer:              Sanda Klein, MD  Pre-operative Echo Findings: ? Severe aortic stenosis ? Normal left ventricular systolic function  Post-operative Echo Findings: ? No paravalvular leak ? Normal left ventricular systolic function  _____________   Echo 11/14/18: completed but pending formal read at the time of discharge   History of Present Illness    [name] Kennedy is a 78 y.o. female with a history of CKD stage IV (LUE AV fistula in place), DMT2, CAD s/p previous PCI 2015, DMT2, HTN, HLD, GERD, multiple CVAs (ocular CVA  08/2018), diverticulitis s/p partial colectomy, severe LVH and severe aortic stenosis who presented to [facility] on 11/13/18 for planned TAVR.  Patient states that she suffered her first stroke in 1999.  She has had a total of 3 in 2001 she underwent stenting of intracranial artery because of cerebrovascular disease.  She has been left with mild left-sided weakness as a residual from her most recent stroke, which was in August of this year. Her cardiac history dates back more than 10 years and she has been followed by Dr. Einar Gip and Dr. Virgina Jock.  Previous echocardiograms have documented the presence of normal left ventricular systolic function with aortic stenosis that has gradually progressed in severity. Over the past year the patient has experienced gradual progression of increased exercise tolerance and worsening symptoms of congestive heart failure. Follow-up echocardiogram performed September 10, 2018 revealed normal left ventricular systolic function with severe aortic stenosis. She underwent diagnostic cardiac catheterization revealing moderate ostial stenosis of the right coronary artery was otherwise nonobstructive coronary artery disease. There was mild pulmonary hypertension.   The patient has been evaluated by the multidisciplinary valve team and felt to have severe, symptomatic aortic stenosis  and to be a suitable candidate for TAVR, which was set up for 11/13/18.    Hospital Course     Consultants: none.   Severe AS: s/p successful TAVR with a 23 mm Edwards Sapien 3 THV via the TF approach on 11/13/18. Post operative echo completed but pending formal read. Groin sites are stable. ECG today with sinus HR 81 bpm and resolution of 1st deg AV block and LBBB (of note, the ECGs were not pulled into Epic at the time of discharge due to an issue with our ECG machines. I personally reviewed both ECGs in her physical chart.) There is no evidence of high grade heart block. She was discharged on DAPT with  Plavix 75 mg and ASA 81 mg daily for 3 weeks and then Plavix alone after her stroke in August, but she has remained on DAPT. Will continue this for now and plan to discontinued aspirin 6 months after TAVR.  Acute on chronic diastolic CHF: as evidenced by an elevated BNP on pre admission lab work and cardiomegaly on CXR. This has been treated with TAVR.  She has been resumed on home Torsemide 20mg  daily.   CKD stage IV: creat 2.88 and GFR 17 today. She was given minimal contrast during TAVR yesterday. Plan to check a BMET next week in the office.   CAD: pre TAVR cath showed moderate ostial RCA stenosis, no obstructive disease in the left main artery, LAD or Circumflex. The distal RCA stent is patent. Continue medical therapy.   HTN: BP elevated. Resume home amlodipine and labetalol  Anemia of chronic disease: H/H has remained stable.    DMT2: treated with SSI while admitted. Resume home meds at discharge.   Incidental findings: pre TAVR CT showed indeterminate lesion in the lower pole of the left kidney estimated to measure approximately 4.1 x 3.0 x 2.7 cm, suspicious for potential cystic renal neoplasm. Follow-up nonemergent definitive characterization of this lesion with MRI of the abdomen with and without IV gadolinium is strongly recommended in the near future. I will discuss this in the office with her next week.  _____________  Discharge Vitals Blood pressure (!) 147/45, pulse 69, temperature 98.1 F (36.7 C), temperature source Oral, resp. rate 18, height 5' 2.5" (1.588 m), weight 75.5 kg, SpO2 100 %.  Filed Weights   11/13/18 0621 11/14/18 0300  Weight: 72.6 kg 75.5 kg    ROS:   Please see the history of present illness.    ROS All other systems reviewed and are negative.   PHYSICAL EXAM:   VS:  BP (!) 147/45 (BP Location: Right Arm)    Pulse 69    Temp 98.1 F (36.7 C) (Oral)    Resp 18    Ht 5' 2.5" (1.588 m)    Wt 75.5 kg    SpO2 100%    BMI 29.97 kg/m    GEN: Well  nourished, well developed, in no acute distress HEENT: normal Neck: no JVD or masses Cardiac: RRR; soft flow murmur. No rubs, or gallops,no edema  Respiratory:  clear to auscultation bilaterally, normal work of breathing GI: soft, nontender, nondistended, + BS MS: no deformity or atrophy Skin: warm and dry, no rash.  Groin sites clear without hematoma or ecchymosis  Neuro:  Alert and Oriented x 3, Strength and sensation are intact Psych: euthymic mood, full affect   Labs & Radiologic Studies    CBC Recent Labs    11/13/18 1002 11/14/18 0401  WBC  --  10.4  HGB 9.5* 9.9*  HCT 28.0* 30.9*  MCV  --  93.4  PLT  --  123456   Basic Metabolic Panel Recent Labs    11/13/18 1002 11/14/18 0401  NA 140 137  K 4.1 4.5  CL 106 105  CO2  --  20*  GLUCOSE 135* 92  BUN 33* 36*  CREATININE 2.90* 2.88*  CALCIUM  --  8.8*  MG  --  1.7   Liver Function Tests No results for input(s): AST, ALT, ALKPHOS, BILITOT, PROT, ALBUMIN in the last 72 hours. No results for input(s): LIPASE, AMYLASE in the last 72 hours. Cardiac Enzymes No results for input(s): CKTOTAL, CKMB, CKMBINDEX, TROPONINI in the last 72 hours. BNP Invalid input(s): POCBNP D-Dimer No results for input(s): DDIMER in the last 72 hours. Hemoglobin A1C No results for input(s): HGBA1C in the last 72 hours. Fasting Lipid Panel No results for input(s): CHOL, HDL, LDLCALC, TRIG, CHOLHDL, LDLDIRECT in the last 72 hours. Thyroid Function Tests No results for input(s): TSH, T4TOTAL, T3FREE, THYROIDAB in the last 72 hours.  Invalid input(s): FREET3 _____________  Dg Chest 2 View  Result Date: 11/09/2018 CLINICAL DATA:  Aortic valve replacement. EXAM: CHEST - 2 VIEW COMPARISON:  09/10/2018.  CT 11/01/2018. FINDINGS: Pericardial calcification again noted. Cardiomegaly with pulmonary vascular prominence and bilateral interstitial prominence. Small bilateral pleural effusions. Findings consistent CHF. Findings have improved from  09/10/2018. IMPRESSION: Congestive heart failure bilateral interstitial edema and bilateral pleural effusions. Interim improvement from prior exam. Electronically Signed   By: Elfrida   On: 11/09/2018 13:14   Ct Coronary Morph W/cta Cor Nancy Fetter W/ca W/cm &/or Wo/cm  Addendum Date: 11/08/2018   ADDENDUM REPORT: 11/08/2018 12:25 ADDENDUM: Correction: Annular measurements of the aortic valve borderline between 20 and 23 mm Edwards-SAPIEN 3 Ultra valve (not between 23 and 26 mm as previously stated). Electronically Signed   By: Ena Dawley   On: 11/08/2018 12:25   Addendum Date: 11/01/2018   ADDENDUM REPORT: 11/01/2018 23:56 CLINICAL DATA:  78 year old female with severe aortic stenosis being evaluated for a TAVR procedure. EXAM: Cardiac TAVR CT TECHNIQUE: The patient was scanned on a [employer]. A 120 kV retrospective scan was triggered in the descending thoracic aorta at 111 HU's. Gantry rotation speed was 250 msecs and collimation was .6 mm. No beta blockade or nitro were given. The 3D data set was reconstructed in 5% intervals of the R-R cycle. Systolic and diastolic phases were analyzed on a dedicated work station using MPR, MIP and VRT modes. The patient received 80 cc of contrast. FINDINGS: Aortic Valve: Trileaflet aortic valve with severely calcified leaflets with severely restricted leaflets opening and no calcifications extending into the LVOT. Aorta: Normal size with mild diffuse atherosclerotic plaque and calcifications and no dissection. Sinotubular Junction: 26 x 24 mm Ascending Thoracic Aorta: 37 x 36 mm Aortic Arch: 24 x 23 mm Descending Thoracic Aorta: 22 x 21 mm Sinus of Valsalva Measurements: Non-coronary: 27 mm Right -coronary: 26 mm Left -coronary: 28 mm Coronary Artery Height above Annulus: Left Main: 14 mm Right Coronary: 15 mm Virtual Basal Annulus Measurements: Maximum/Minimum Diameter: 24.0 x 19.6 mm Mean Diameter: 20.9 mm Perimeter: 66.9 mm Area: 343 mm2  Optimum Fluoroscopic Angle for Delivery: LAO 18 CAU 21 Dilated pulmonary artery measuring 35 mm suggestive of pulmonary hypertension. IMPRESSION: 1. Trileaflet aortic valve with severely calcified leaflets with severely restricted leaflets opening and no calcifications extending into the LVOT. Aortic valve calcium score 2413 consistent with severe aortic  stenosis. Annular measurements borderline between 23 and 26 mm valves, but would favor 26 mm Edwards-SAPIEN 3 valve. 2. Sufficient coronary to annulus distance. 3. Optimum Fluoroscopic Angle for Delivery:  LAO 18 CAU 21. 4. No thrombus in the left atrial appendage. Electronically Signed   By: Ena Dawley   On: 11/01/2018 23:56   Result Date: 11/08/2018 EXAM: OVER-READ INTERPRETATION  CT CHEST The following report is an over-read performed by radiologist Dr. Vinnie Langton of [facility], [place] on 11/01/2018. This over-read does not include interpretation of cardiac or coronary anatomy or pathology. The coronary calcium score/coronary CTA interpretation by the cardiologist is attached. COMPARISON:  None. FINDINGS: Extracardiac findings will be described separately under dictation for contemporaneously obtained CTA chest, abdomen and pelvis. IMPRESSION: Please see separate dictation for contemporaneously obtained CTA chest, abdomen and pelvis dated 11/01/2018 for full description of relevant extracardiac findings. Electronically Signed: By: Vinnie Langton [name] On: 11/01/2018 14:11   Dg Chest Port 1 View  Result Date: 11/13/2018 CLINICAL DATA:  Aortic valve replacement EXAM: PORTABLE CHEST 1 VIEW COMPARISON:  11/09/2018 FINDINGS: Stable cardiomegaly. Interval placement of prosthetic aortic valve. Lungs are clear. No pleural effusion or pneumothorax. IMPRESSION: Stable cardiomegaly with interval placement of prosthetic aortic valve. No acute findings. Electronically Signed   By: Davina Poke [name]   On: 11/13/2018 12:34   Ct Angio Chest Aorta  W &/or Wo Contrast  Result Date: 11/01/2018 CLINICAL DATA:  78 year old female with history of severe aortic valve stenosis. Preprocedural study prior to potential transcatheter aortic valve replacement (TAVR) procedure. EXAM: CT ANGIOGRAPHY CHEST, ABDOMEN AND PELVIS TECHNIQUE: Multidetector CT imaging through the chest, abdomen and pelvis was performed using the standard protocol during bolus administration of intravenous contrast. Multiplanar reconstructed images and MIPs were obtained and reviewed to evaluate the vascular anatomy. CONTRAST:  75mL OMNIPAQUE IOHEXOL 350 MG/ML SOLN COMPARISON:  None. FINDINGS: CTA CHEST FINDINGS Cardiovascular: Heart size is mildly enlarged. Pericardial thickening and calcification overlying the free wall the right ventricle where there is focal distortion of the cardiac contour, which may suggest underlying constriction. There is aortic atherosclerosis, as well as atherosclerosis of the great vessels of the mediastinum and the coronary arteries, including calcified atherosclerotic plaque in the left anterior descending, left circumflex and right coronary arteries. Severe thickening calcification of the aortic valve. Mediastinum/Lymph Nodes: No pathologically enlarged mediastinal or hilar lymph nodes. Esophagus is unremarkable in appearance. No axillary lymphadenopathy. Lungs/Pleura: Trace right pleural effusion lying dependently. No acute consolidative airspace disease. No suspicious appearing pulmonary nodules or masses are noted. Musculoskeletal/Soft Tissues: There are no aggressive appearing lytic or blastic lesions noted in the visualized portions of the skeleton. CTA ABDOMEN AND PELVIS FINDINGS Hepatobiliary: No suspicious cystic or solid hepatic lesions. No intra or extrahepatic biliary ductal dilatation. Gallbladder is normal in appearance. Pancreas: No pancreatic mass. No pancreatic ductal dilatation. No pancreatic or peripancreatic fluid collections or inflammatory  changes. Spleen: Unremarkable. Adrenals/Urinary Tract: In the lower pole of the left kidney there is a somewhat ill-defined lesion which appears to have internal septations and low level enhancement estimated to measure approximately 4.1 x 3.0 x 2.7 cm (axial image 317 of series 6 and coronal image 73 of series 14), considered indeterminate. Other low-attenuation lesions in both kidneys, compatible with simple cysts, largest of which measures 2.9 cm in the posterior aspect of the interpolar region of the left kidney. Several subcentimeter low-attenuation lesions in both kidneys, too small to characterize. No hydroureteronephrosis. Urinary bladder is unremarkable in appearance. Bilateral  adrenal glands are normal in appearance. Stomach/Bowel: Normal appearance of the stomach. No pathologic dilatation of small bowel or colon. Normal appendix. Vascular/Lymphatic: Aortic atherosclerosis, with vascular findings and measurements pertinent to potential TAVR procedure, as detailed below. No aneurysm or dissection noted in the abdominal or pelvic vasculature. No lymphadenopathy noted in the abdomen or pelvis. Reproductive: Status post hysterectomy. Ovaries are not confidently identified may be surgically absent or atrophic. Other: No significant volume of ascites.  No pneumoperitoneum. Musculoskeletal: There are no aggressive appearing lytic or blastic lesions noted in the visualized portions of the skeleton. VASCULAR MEASUREMENTS PERTINENT TO TAVR: AORTA: Minimal Aortic Diameter-9 x 9 mm Severity of Aortic Calcification-severe RIGHT PELVIS: Right Common Iliac Artery - Minimal Diameter-8.5 x 8.0 mm Tortuosity-mild Calcification-mild-to-moderate Right External Iliac Artery - Minimal Diameter-6.5 x 6.4 mm Tortuosity-moderate Calcification-none Right Common Femoral Artery - Minimal Diameter-6.2 x 5.8 mm Tortuosity-mild Calcification-mild LEFT PELVIS: Left Common Iliac Artery - Minimal Diameter-8.5 x 7.6 mm Tortuosity-mild  Calcification-mild-to-moderate Left External Iliac Artery - Minimal Diameter-6.9 x 6.5 mm Tortuosity-mild Calcification-none Left Common Femoral Artery - Minimal Diameter-5.9 x 6.3 mm Tortuosity-mild Calcification-none Review of the MIP images confirms the above findings. IMPRESSION: 1. Vascular findings and measurements pertinent to potential TAVR procedure, as detailed above. 2. Severe thickening calcification of the aortic valve, compatible with the reported clinical history of severe aortic stenosis. 3. Pericardial thickening calcification overlying the free wall of the right ventricle immediately anterior to the atrioventricular groove where there is also focal distortion of the cardiac contour, which may suggest constrictive physiology. Correlation with echocardiography is suggested if clinically appropriate. 4. Aortic atherosclerosis, in addition to 3 vessel coronary artery disease. 5. Indeterminate lesion in the lower pole of the left kidney estimated to measure approximately 4.1 x 3.0 x 2.7 cm, suspicious for potential cystic renal neoplasm. Follow-up nonemergent definitive characterization of this lesion with MRI of the abdomen with and without IV gadolinium is strongly recommended in the near future. 6. Additional incidental findings, as above. Electronically Signed   By: Vinnie Langton [name]   On: 11/01/2018 15:03   Ct Angio Abd/pel W/ And/or W/o  Result Date: 11/01/2018 CLINICAL DATA:  78 year old female with history of severe aortic valve stenosis. Preprocedural study prior to potential transcatheter aortic valve replacement (TAVR) procedure. EXAM: CT ANGIOGRAPHY CHEST, ABDOMEN AND PELVIS TECHNIQUE: Multidetector CT imaging through the chest, abdomen and pelvis was performed using the standard protocol during bolus administration of intravenous contrast. Multiplanar reconstructed images and MIPs were obtained and reviewed to evaluate the vascular anatomy. CONTRAST:  68mL OMNIPAQUE IOHEXOL 350  MG/ML SOLN COMPARISON:  None. FINDINGS: CTA CHEST FINDINGS Cardiovascular: Heart size is mildly enlarged. Pericardial thickening and calcification overlying the free wall the right ventricle where there is focal distortion of the cardiac contour, which may suggest underlying constriction. There is aortic atherosclerosis, as well as atherosclerosis of the great vessels of the mediastinum and the coronary arteries, including calcified atherosclerotic plaque in the left anterior descending, left circumflex and right coronary arteries. Severe thickening calcification of the aortic valve. Mediastinum/Lymph Nodes: No pathologically enlarged mediastinal or hilar lymph nodes. Esophagus is unremarkable in appearance. No axillary lymphadenopathy. Lungs/Pleura: Trace right pleural effusion lying dependently. No acute consolidative airspace disease. No suspicious appearing pulmonary nodules or masses are noted. Musculoskeletal/Soft Tissues: There are no aggressive appearing lytic or blastic lesions noted in the visualized portions of the skeleton. CTA ABDOMEN AND PELVIS FINDINGS Hepatobiliary: No suspicious cystic or solid hepatic lesions. No intra or extrahepatic biliary ductal dilatation.  Gallbladder is normal in appearance. Pancreas: No pancreatic mass. No pancreatic ductal dilatation. No pancreatic or peripancreatic fluid collections or inflammatory changes. Spleen: Unremarkable. Adrenals/Urinary Tract: In the lower pole of the left kidney there is a somewhat ill-defined lesion which appears to have internal septations and low level enhancement estimated to measure approximately 4.1 x 3.0 x 2.7 cm (axial image 317 of series 6 and coronal image 73 of series 14), considered indeterminate. Other low-attenuation lesions in both kidneys, compatible with simple cysts, largest of which measures 2.9 cm in the posterior aspect of the interpolar region of the left kidney. Several subcentimeter low-attenuation lesions in both kidneys,  too small to characterize. No hydroureteronephrosis. Urinary bladder is unremarkable in appearance. Bilateral adrenal glands are normal in appearance. Stomach/Bowel: Normal appearance of the stomach. No pathologic dilatation of small bowel or colon. Normal appendix. Vascular/Lymphatic: Aortic atherosclerosis, with vascular findings and measurements pertinent to potential TAVR procedure, as detailed below. No aneurysm or dissection noted in the abdominal or pelvic vasculature. No lymphadenopathy noted in the abdomen or pelvis. Reproductive: Status post hysterectomy. Ovaries are not confidently identified may be surgically absent or atrophic. Other: No significant volume of ascites.  No pneumoperitoneum. Musculoskeletal: There are no aggressive appearing lytic or blastic lesions noted in the visualized portions of the skeleton. VASCULAR MEASUREMENTS PERTINENT TO TAVR: AORTA: Minimal Aortic Diameter-9 x 9 mm Severity of Aortic Calcification-severe RIGHT PELVIS: Right Common Iliac Artery - Minimal Diameter-8.5 x 8.0 mm Tortuosity-mild Calcification-mild-to-moderate Right External Iliac Artery - Minimal Diameter-6.5 x 6.4 mm Tortuosity-moderate Calcification-none Right Common Femoral Artery - Minimal Diameter-6.2 x 5.8 mm Tortuosity-mild Calcification-mild LEFT PELVIS: Left Common Iliac Artery - Minimal Diameter-8.5 x 7.6 mm Tortuosity-mild Calcification-mild-to-moderate Left External Iliac Artery - Minimal Diameter-6.9 x 6.5 mm Tortuosity-mild Calcification-none Left Common Femoral Artery - Minimal Diameter-5.9 x 6.3 mm Tortuosity-mild Calcification-none Review of the MIP images confirms the above findings. IMPRESSION: 1. Vascular findings and measurements pertinent to potential TAVR procedure, as detailed above. 2. Severe thickening calcification of the aortic valve, compatible with the reported clinical history of severe aortic stenosis. 3. Pericardial thickening calcification overlying the free wall of the right  ventricle immediately anterior to the atrioventricular groove where there is also focal distortion of the cardiac contour, which may suggest constrictive physiology. Correlation with echocardiography is suggested if clinically appropriate. 4. Aortic atherosclerosis, in addition to 3 vessel coronary artery disease. 5. Indeterminate lesion in the lower pole of the left kidney estimated to measure approximately 4.1 x 3.0 x 2.7 cm, suspicious for potential cystic renal neoplasm. Follow-up nonemergent definitive characterization of this lesion with MRI of the abdomen with and without IV gadolinium is strongly recommended in the near future. 6. Additional incidental findings, as above. Electronically Signed   By: Vinnie Langton [name]   On: 11/01/2018 15:03   Disposition   Pt is being discharged home today in good condition.  Follow-up Plans & Appointments    Follow-up Information    Eileen Stanford, PA-C. Go on 11/21/2018.   Specialties: Cardiology, Radiology Why: @ 1:30pm, please arrive at least 10 minutes early.  Contact information: [address] [place] [id number] [phone]            Discharge Medications   Allergies as of 11/14/2018   No Known Allergies     Medication List    TAKE these medications   acetaminophen 325 MG tablet Commonly known as: TYLENOL Take 650 mg by mouth every 6 (six) hours as needed for  moderate pain or headache.   allopurinol 100 MG tablet Commonly known as: ZYLOPRIM Take 50 mg by mouth daily.   amLODipine 10 MG tablet Commonly known as: NORVASC Take 1 tablet by mouth every day   aspirin EC 81 MG tablet Take 81 mg by mouth daily.   B-D INS SYR ULTRAFINE 1CC/30G 30G X 1/2" 1 ML Misc Generic drug: Insulin Syringe-Needle U-100 Inject 1 each into the skin 3 (three) times daily between meals.   calcitRIOL 0.25 MCG capsule Commonly known as: ROCALTROL Take 0.25 mcg by mouth daily.   clopidogrel 75 MG tablet Commonly  known as: PLAVIX Take 1 tablet (75 mg total) by mouth daily.   folic acid 1 MG tablet Commonly known as: FOLVITE Take 1 mg by mouth daily.   HumaLOG KwikPen 100 UNIT/ML KwikPen Generic drug: insulin lispro Inject 2-8 Units into the skin 3 (three) times daily before meals.   insulin glargine 100 UNIT/ML injection Commonly known as: LANTUS Inject 16 Units into the skin at bedtime.   labetalol 100 MG tablet Commonly known as: NORMODYNE Take 100 mg by mouth 2 (two) times daily.   leflunomide 10 MG tablet Commonly known as: ARAVA Take 10 mg by mouth daily.   niacin 500 MG CR tablet Commonly known as: NIASPAN Take 500 mg by mouth daily.   nitroGLYCERIN 0.4 MG SL tablet Commonly known as: NITROSTAT PLACE 1 TABLET (0.4 MG TOTAL) UNDER THE TONGUE EVERY 5 MINUTES X 3 DOSES AS NEEDED FOR CHEST PAIN. What changed: See the new instructions.   nortriptyline 10 MG capsule Commonly known as: PAMELOR Take 10 mg by mouth daily.   ONE TOUCH ULTRA TEST test strip Generic drug: glucose blood 1 each by Other route 3 (three) times daily between meals.   oxyCODONE-acetaminophen 5-325 MG tablet Commonly known as: Percocet Take 1 tablet by mouth every 6 (six) hours as needed for severe pain.   Pristiq 100 MG 24 hr tablet Generic drug: desvenlafaxine Take 100 mg by mouth daily.   rosuvastatin 20 MG tablet Commonly known as: CRESTOR Take 20 mg by mouth daily.   Tears Again 1.4 % ophthalmic solution Generic drug: polyvinyl alcohol Place 1 drop into both eyes 3 (three) times daily as needed for dry eyes.   torsemide 20 MG tablet Commonly known as: DEMADEX Take 1 tablet (20 mg total) by mouth daily. May take additional dose in the afternoon for swelling, shortness of breath or weight gain. What changed: how much to take           Outstanding Labs/Studies  BMET  Duration of Discharge Encounter   Greater than 30 minutes including physician time.  Mable Fill,  PA-C 11/14/2018, 10:54 AM [phone]

## 2018-11-14 NOTE — Progress Notes (Signed)
Order received to discharge patient.  Telemetry monitor removed and CCMD notified.  PIV access removed.  Discharge instructions, follow up, medications and instructions for their use discussed with patient. 

## 2018-11-15 ENCOUNTER — Telehealth: Payer: Self-pay | Admitting: Physician Assistant

## 2018-11-15 ENCOUNTER — Ambulatory Visit: Payer: Medicare Other | Admitting: Physical Therapy

## 2018-11-15 MED FILL — Potassium Chloride Inj 2 mEq/ML: INTRAVENOUS | Qty: 40 | Status: AC

## 2018-11-15 MED FILL — Magnesium Sulfate Inj 50%: INTRAMUSCULAR | Qty: 10 | Status: AC

## 2018-11-15 MED FILL — Heparin Sodium (Porcine) Inj 1000 Unit/ML: INTRAMUSCULAR | Qty: 30 | Status: AC

## 2018-11-15 NOTE — Telephone Encounter (Signed)
  [place] VALVE TEAM   Patient contacted regarding discharge from [facility] on 11/14/2018  Patient understands to follow up with provider Nell Range on 11/4 at [facility].  Patient understands discharge instructions? yes Patient understands medications and regimen? yes Patient understands to bring all medications to this visit? Yes   Angelena Form PA-C  [facility]

## 2018-11-16 ENCOUNTER — Telehealth (HOSPITAL_COMMUNITY): Payer: Self-pay

## 2018-11-16 NOTE — Telephone Encounter (Signed)
Pt insurance is active and benefits verified through [facility] Medicare. Co-pay $20.00, DED $0.00/$0.00 met, out of pocket $4,000.00/$2,705.32 met, co-insurance 0%. No pre-authorization required. Passport, 11/16/2018 @ 11:30AM, UVO#[id number]  Will pass to RN Navigator for review.

## 2018-11-19 ENCOUNTER — Ambulatory Visit: Payer: Medicare Other | Admitting: Physical Therapy

## 2018-11-19 ENCOUNTER — Ambulatory Visit

## 2018-11-19 ENCOUNTER — Other Ambulatory Visit: Payer: Self-pay

## 2018-11-19 DIAGNOSIS — I350 Nonrheumatic aortic (valve) stenosis: Secondary | ICD-10-CM

## 2018-11-20 ENCOUNTER — Other Ambulatory Visit: Payer: Self-pay | Admitting: Cardiology

## 2018-11-20 DIAGNOSIS — Z952 Presence of prosthetic heart valve: Secondary | ICD-10-CM

## 2018-11-20 NOTE — Progress Notes (Signed)
Post- TAVR echo order placed

## 2018-11-20 NOTE — Progress Notes (Signed)
[facility]                                       Cardiology Office Note    Date:  11/21/2018   ID:  [name], DOB 1940/10/01, MRN [id number]  PCP:  Merrilee Seashore, MD  Cardiologist: Dr. Virgina Jock / Dr. Angelena Form & Dr. [name] (TAVR)  CC: TOC s/p TAVR  History of Present Illness:  [name] is a 78 y.o. 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  Patient states that she suffered her first stroke in 1999.She has had a total of 3 in 2001 she underwent stenting of intracranial artery because of cerebrovascular disease. She has been left with mild left-sided weakness as a residual from her most recent stroke, which was in August of this year. She was discharged on aspirin and plavix with plans to drop aspirin after 3 weeks but she had continued on DAPT. Her cardiac history dates back more than 10 years and she has been followed by Dr. Einar Gip and Dr. Virgina Jock. Previous echocardiograms have documented the presence of normal left ventricular systolic function with aortic stenosis that has gradually progressed in severity. Over the past year the patient has experienced gradual progression of increased exercise tolerance and worsening symptoms of congestive heart failure. Follow-up echocardiogram performed September 10, 2018 revealed normal left ventricular systolic function with severe aortic stenosis. She underwent diagnostic cardiac catheterization revealing moderate ostial stenosis of the right coronary artery was otherwise nonobstructive coronary artery disease. There was mild pulmonary hypertension.   She was evaluated by the multidisciplinary valve team and underwent successful TAVR with a 23  mm Edwards Sapien 3 Ultra THV via the TF approach on 11/13/18. Post operative echo showed EF 60-65% normally functioning TAVR with mean gradient 14.78mm Hg and no PVL. She had an uncomplicated hospital course and was continued on DAPT with Asprin and plavix at discharge on POD1.  Today she presents to clinic for follow up. She is doing quite well. She has had improvement in her dyspnea and energy since TAVR. No CP or SOB. No LE edema, orthopnea or PND. No dizziness or syncope. No blood in stool or urine. No palpitations. Walks up and down her driveway for exercise with no issues.   Past Medical History:  Diagnosis Date  . Arthritis   . Cerebrovascular disease   . CKD (chronic kidney disease)    Sees Dr Hollie Salk  . Coronary artery disease   . Depression   . Diabetes ([place])    INSULIN DEPENDENT  . Diabetic peripheral neuropathy ([place])   . Diabetic retinopathy ([place])   . Diverticulitis   . GERD (gastroesophageal reflux disease)   . History of CVA (cerebrovascular accident)    x 2 no residulal  . Hyperlipidemia   . Hypertension   . Obesity   . Renal lesion   . S/P TAVR (transcatheter aortic valve replacement) 11/13/2018   s/p TAVR with a 57mm Edwards S3U via the TF approach by Dr. [name] and Dr. Angelena Form  . Severe aortic stenosis     Past Surgical History:  Procedure Laterality Date  .  ABDOMINAL HYSTERECTOMY    . AV FISTULA PLACEMENT Left 12/18/2017   Procedure: ARTERIOVENOUS (AV) FISTULA CREATION ARM;  Surgeon: Marty Heck, MD;  Location: [place];  Service: Vascular;  Laterality: Left;  . bilateral cataract surgery    . CARDIAC CATHETERIZATION  01/07/2014   DR Durand RESECTION  02/1999  . COLONOSCOPY    . COLOSTOMY  02/1999  . COLOSTOMY CLOSURE  07/1999  . EYE SURGERY Left 2019  . FISTULA SUPERFICIALIZATION Left 06/29/2018   Procedure: FISTULA SUPERFICIALIZATION LEFT BRACHIOCEPHALIC;  Surgeon: Marty Heck, MD;  Location: [place];  Service: Vascular;  Laterality:  Left;  . LEFT HEART CATHETERIZATION WITH CORONARY ANGIOGRAM N/A 01/07/2014   Procedure: LEFT HEART CATHETERIZATION WITH CORONARY ANGIOGRAM;  Surgeon: Laverda Page, MD;  Location: [facility] CATH LAB;  Service: Cardiovascular;  Laterality: N/A;  . middle cerebral artery stent placement Right   . OTHER SURGICAL HISTORY     laser surgery  . PTCA  01/07/2014   DES to RCA    DR Einar Gip  . right knee surgery Right    for infection  . RIGHT/LEFT HEART CATH AND CORONARY ANGIOGRAPHY N/A 10/23/2018   Procedure: RIGHT/LEFT HEART CATH AND CORONARY ANGIOGRAPHY;  Surgeon: Nigel Mormon, MD;  Location: [place] CV LAB;  Service: Cardiovascular;  Laterality: N/A;  . TEE WITHOUT CARDIOVERSION N/A 11/13/2018   Procedure: TRANSESOPHAGEAL ECHOCARDIOGRAM (TEE);  Surgeon: Burnell Blanks, MD;  Location: [place];  Service: Open Heart Surgery;  Laterality: N/A;  . TRANSCATHETER AORTIC VALVE REPLACEMENT, TRANSFEMORAL  11/13/2018  . TRANSCATHETER AORTIC VALVE REPLACEMENT, TRANSFEMORAL N/A 11/13/2018   Procedure: TRANSCATHETER AORTIC VALVE REPLACEMENT, TRANSFEMORAL;  Surgeon: Burnell Blanks, MD;  Location: [place];  Service: Open Heart Surgery;  Laterality: N/A;    Current Medications: Outpatient Medications Prior to Visit  Medication Sig Dispense Refill  . acetaminophen (TYLENOL) 325 MG tablet Take 650 mg by mouth every 6 (six) hours as needed for moderate pain or headache.     . allopurinol (ZYLOPRIM) 100 MG tablet Take 50 mg by mouth daily.    Marland Kitchen amLODipine (NORVASC) 10 MG tablet Take 1 tablet by mouth every day (Patient taking differently: Take 10 mg by mouth daily. ) 30 tablet 11  . aspirin EC 81 MG tablet Take 81 mg by mouth daily.    . B-D INS SYR ULTRAFINE 1CC/30G 30G X 1/2" 1 ML MISC Inject 1 each into the skin 3 (three) times daily between meals.    . calcitRIOL (ROCALTROL) 0.25 MCG capsule Take 0.25 mcg by mouth daily.     . clopidogrel (PLAVIX) 75 MG tablet Take 1 tablet (75 mg total) by  mouth daily. 90 tablet 1  . folic acid (FOLVITE) 1 MG tablet Take 1 mg by mouth daily.    Marland Kitchen HUMALOG KWIKPEN 100 UNIT/ML KwikPen Inject 2-8 Units into the skin 3 (three) times daily before meals.     . insulin glargine (LANTUS) 100 UNIT/ML injection Inject 16 Units into the skin at bedtime.     Marland Kitchen labetalol (NORMODYNE) 100 MG tablet Take 100 mg by mouth 2 (two) times daily.    Marland Kitchen leflunomide (ARAVA) 10 MG tablet Take 10 mg by mouth daily.    . niacin (NIASPAN) 500 MG CR tablet Take 500 mg by mouth daily.     . nitroGLYCERIN (NITROSTAT) 0.4 MG SL tablet PLACE 1 TABLET (0.4 MG TOTAL) UNDER THE TONGUE EVERY 5 MINUTES X 3 DOSES AS NEEDED FOR CHEST PAIN. (  Patient taking differently: Place 0.4 mg under the tongue every 5 (five) minutes as needed for chest pain. ) 25 tablet 1  . nortriptyline (PAMELOR) 10 MG capsule Take 10 mg by mouth daily.    . ONE TOUCH ULTRA TEST test strip 1 each by Other route 3 (three) times daily between meals.    Marland Kitchen oxyCODONE-acetaminophen (PERCOCET) 5-325 MG tablet Take 1 tablet by mouth every 6 (six) hours as needed for severe pain. 12 tablet 0  . polyvinyl alcohol (TEARS AGAIN) 1.4 % ophthalmic solution Place 1 drop into both eyes 3 (three) times daily as needed for dry eyes.    Marland Kitchen PRISTIQ 100 MG 24 hr tablet Take 100 mg by mouth daily.     . rosuvastatin (CRESTOR) 20 MG tablet Take 20 mg by mouth daily.    Marland Kitchen torsemide (DEMADEX) 20 MG tablet Take 1 tablet (20 mg total) by mouth daily. May take additional dose in the afternoon for swelling, shortness of breath or weight gain. (Patient taking differently: Take 40 mg by mouth daily. May take additional dose in the afternoon for swelling, shortness of breath or weight gain.) 180 tablet 0   No facility-administered medications prior to visit.      Allergies:   Patient has no known allergies.   Social History   Socioeconomic History  . Marital status: Widowed    Spouse name: Not on file  . Number of children: 2  . Years of  education: PHD  . Highest education level: Not on file  Occupational History  . Occupation: Retired PhD Pharmacist, hospital [employer]  Social Needs  . Financial resource strain: Not on file  . Food insecurity    Worry: Not on file    Inability: Not on file  . Transportation needs    Medical: Not on file    Non-medical: Not on file  Tobacco Use  . Smoking status: Never Smoker  . Smokeless tobacco: Never Used  Substance and Sexual Activity  . Alcohol use: No  . Drug use: No  . Sexual activity: Not on file  Lifestyle  . Physical activity    Days per week: Not on file    Minutes per session: Not on file  . Stress: Not on file  Relationships  . Social Herbalist on phone: Not on file    Gets together: Not on file    Attends religious service: Not on file    Active member of club or organization: Not on file    Attends meetings of clubs or organizations: Not on file    Relationship status: Not on file  Other Topics Concern  . Not on file  Social History Narrative   Patient is widowed and lives with her two adopted children.   Patient is retired.   Patient has two children of her own and two adopted children.   Patient has a PHD.   Patient is right-handed.     Family History:  The patient's family history includes Diabetes in her mother; Heart disease in her mother; Hypertension in her brother; Prostate cancer in her brother and father.     ROS:   Please see the history of present illness.    ROS All other systems reviewed and are negative.   PHYSICAL EXAM:   VS:  BP (!) 152/58   Pulse 62   Ht 5' 2.5" (1.588 m)   Wt 161 lb 6.4 oz (73.2 kg)   BMI 29.05 kg/m  GEN: Well nourished, well developed, in no acute distress HEENT: normal Neck: no JVD or masses Cardiac: RRR; 2/6 SEM. No rubs, or gallops,no edema  Respiratory:  clear to auscultation bilaterally, normal work of breathing GI: soft, nontender, nondistended, + BS MS: no deformity or atrophy Skin: warm and dry,  no rash.  Groin sites clear without hematoma or ecchymosis  Neuro:  Alert and Oriented x 3, Strength and sensation are intact Psych: euthymic mood, full affect   Wt Readings from Last 3 Encounters:  11/21/18 161 lb 6.4 oz (73.2 kg)  11/14/18 166 lb 8 oz (75.5 kg)  11/09/18 160 lb (72.6 kg)      Studies/Labs Reviewed:   EKG:  EKG is ordered today.  The ekg ordered today demonstrates sinus with 1st deg AV block, HR 62 bpm, nonspecific TW flattening and TWIs HR 62  Recent Labs: 11/09/2018: ALT 12; B Natriuretic Peptide 1,116.4 11/14/2018: BUN 36; Creatinine, Ser 2.88; Hemoglobin 9.9; Magnesium 1.7; Platelets 211; Potassium 4.5; Sodium 137   Lipid Panel    Component Value Date/Time   CHOL 147 09/10/2018 0507   TRIG 64 09/10/2018 0507   HDL 76 09/10/2018 0507   CHOLHDL 1.9 09/10/2018 0507   VLDL 13 09/10/2018 0507   LDLCALC 58 09/10/2018 0507    Additional studies/ records that were reviewed today include:  TAVR OPERATIVE NOTE   Date of Procedure:11/13/2018  Preoperative Diagnosis:Severe Aortic Stenosis   Postoperative Diagnosis:Same   Procedure:   Transcatheter Aortic Valve Replacement - PercutaneousRightTransfemoral Approach Edwards Sapien 3 Ultra THV (size 39mm, model # 9750TFX, serial # [id number])  Co-Surgeons:Clarence H. [name], MD and Lauree Chandler, MD   Anesthesiologist:Adam Marcie Bal, MD  Echocardiographer:Mihai Croitoru, MD  Pre-operative Echo Findings: ? Severe aortic stenosis ? Normalleft ventricular systolic function  Post-operative Echo Findings: ? Noparavalvular leak ? Normalleft ventricular systolic function  _____________   Echo 11/14/18: IMPRESSIONS  1. Left ventricular ejection fraction, by visual estimation, is 60 to 65%. The left ventricle has normal function. Normal left ventricular size. There is no left  ventricular hypertrophy.  2. Elevated left ventricular end-diastolic pressure.  3. Left ventricular diastolic Doppler parameters are consistent with pseudonormalization pattern of LV diastolic filling.  4. Global right ventricle has normal systolic function.The right ventricular size is normal. No increase in right ventricular wall thickness.  5. Left atrial size was normal.  6. Right atrial size was normal.  7. The mitral valve is normal in structure. Trace mitral valve regurgitation. No evidence of mitral stenosis.  8. The tricuspid valve is normal in structure. Tricuspid valve regurgitation was not visualized by color flow Doppler.  9. Aortic valve regurgitation was not visualized by color flow Doppler. Mild aortic valve stenosis. 10. The pulmonic valve was normal in structure. Pulmonic valve regurgitation is not visualized by color flow Doppler. 11. TR signal is inadequate for assessing pulmonary artery systolic pressure. 12. The inferior vena cava is normal in size with greater than 50% respiratory variability, suggesting right atrial pressure of 3 mmHg.  ASSESSMENT & PLAN:   Severe [id number] sites are healing well. ECG with sinus w/ 1st deg AV block but no HAVB. All restrictions lifted and she is cleared to drive.  She is clinically doing excellent with a big improvement in her dyspnea and energy since TAVR. She will continue on aspirin and Plavix x 6 months and then drop Aspirin (given recurrent strokes will continue monotherapy with Plavix). Binnie Kand PA-C will see her back for her 1 month echo and follow  up in a few weeks.   Chronic diastolic CHF: appears euvolemic. Continue home dose of Torsemide 20mg  daily.   CKD stage IV: creat 2.88 at discharge. Will check BMET today.   CAD: pre TAVR cath showed moderate ostial RCA stenosis, no obstructive disease in the left main artery, LAD or Circumflex. The distal RCA stent is patent. Continue medical therapy. No chest pain.   HTN: BP  initially elevated but patient admitted to being upset prior to her visit. BP was 140/58 on my personal recheck. She will continue to monitor her BP at home and let us know if consistently running 140/90. I discussed how uncontrolled HTN can cause further progression of CKD stage IV.  Incidental findings: pre TAVR CT showed indeterminate lesion in the lower pole of the left kidney estimated to measure approximately 4.1 x 3.0 x 2.7 cm, suspicious for potential cystic renal neoplasm. Follow-up nonemergent definitive characterization of this lesion with MRI of the abdomen with and without IV gadolinium is strongly recommended in the near future. She would like to discuss this with her primary nephrologist, Dr. Hollie Salk, at her visit next week. I will forward her my note. I am hesitant to order this scan with gadolinium if unneccessary given advanced CKD. I will defer need for further imaging to Dr .Hollie Salk.      Medication Adjustments/Labs and Tests Ordered: Current medicines are reviewed at length with the patient today.  Concerns regarding medicines are outlined above.  Medication changes, Labs and Tests ordered today are listed in the Patient Instructions below. Patient Instructions  Medication Instructions:  1) you may STOP ASPIRIN May 14, 2019 2) Your provider discussed the importance of taking an antibiotic prior to all dental visits to prevent damage to the heart valves from infection. You were given a prescription for AMOXIL 2,000 mg to take one hour prior to any dental appointment.   Lab Work: TODAY: BMET If you have labs (blood work) drawn today and your tests are completely normal, you will receive your results only by: Marland Kitchen MyChart Message (if you have MyChart) OR . A paper copy in the mail If you have any lab test that is abnormal or we need to change your treatment, we will call you to review the results.  Follow-Up: Monitor your BLOOD PRESSURE at home and let us know if it is consistently  over 140/90.  Please talk to your kidney doctor about your results. Katie sent a copy of the note as well.     Signed, Angelena Form, PA-C  11/21/2018 2:09 PM    [facility] [address], [place], [place]  [address] Phone: [phone]; Fax: [phone]

## 2018-11-21 ENCOUNTER — Encounter: Payer: Self-pay | Admitting: Physician Assistant

## 2018-11-21 ENCOUNTER — Ambulatory Visit (INDEPENDENT_AMBULATORY_CARE_PROVIDER_SITE_OTHER): Payer: Medicare Other | Admitting: Physician Assistant

## 2018-11-21 ENCOUNTER — Other Ambulatory Visit: Payer: Self-pay

## 2018-11-21 VITALS — BP 152/58 | HR 62 | Ht 62.5 in | Wt 161.4 lb

## 2018-11-21 DIAGNOSIS — I10 Essential (primary) hypertension: Secondary | ICD-10-CM

## 2018-11-21 DIAGNOSIS — N184 Chronic kidney disease, stage 4 (severe): Secondary | ICD-10-CM

## 2018-11-21 DIAGNOSIS — Z952 Presence of prosthetic heart valve: Secondary | ICD-10-CM | POA: Diagnosis not present

## 2018-11-21 DIAGNOSIS — N289 Disorder of kidney and ureter, unspecified: Secondary | ICD-10-CM

## 2018-11-21 DIAGNOSIS — I2510 Atherosclerotic heart disease of native coronary artery without angina pectoris: Secondary | ICD-10-CM | POA: Diagnosis not present

## 2018-11-21 DIAGNOSIS — I5032 Chronic diastolic (congestive) heart failure: Secondary | ICD-10-CM | POA: Diagnosis not present

## 2018-11-21 LAB — BASIC METABOLIC PANEL
BUN/Creatinine Ratio: 16 (ref 12–28)
BUN: 39 mg/dL — ABNORMAL HIGH (ref 8–27)
CO2: 27 mmol/L (ref 20–29)
Calcium: 9.6 mg/dL (ref 8.7–10.3)
Chloride: 98 mmol/L (ref 96–106)
Creatinine, Ser: 2.51 mg/dL — ABNORMAL HIGH (ref 0.57–1.00)
GFR calc Af Amer: 20 mL/min/{1.73_m2} — ABNORMAL LOW (ref >59)
GFR calc non Af Amer: 18 mL/min/{1.73_m2} — ABNORMAL LOW (ref >59)
Glucose: 101 mg/dL — ABNORMAL HIGH (ref 65–99)
Potassium: 4.5 mmol/L (ref 3.5–5.2)
Sodium: 138 mmol/L (ref 134–144)

## 2018-11-21 MED ORDER — AMOXICILLIN 500 MG PO TABS: ORAL_TABLET | ORAL | 10 refills | Status: DC

## 2018-11-21 NOTE — Patient Instructions (Signed)
Medication Instructions:  1) you may STOP ASPIRIN May 14, 2019 2) Your provider discussed the importance of taking an antibiotic prior to all dental visits to prevent damage to the heart valves from infection. You were given a prescription for AMOXIL 2,000 mg to take one hour prior to any dental appointment.   Lab Work: TODAY: BMET If you have labs (blood work) drawn today and your tests are completely normal, you will receive your results only by: Marland Kitchen MyChart Message (if you have MyChart) OR . A paper copy in the mail If you have any lab test that is abnormal or we need to change your treatment, we will call you to review the results.  Follow-Up: Monitor your BLOOD PRESSURE at home and let us know if it is consistently over 140/90.  Please talk to your kidney doctor about your results. Katie sent a copy of the note as well.

## 2018-11-22 ENCOUNTER — Ambulatory Visit: Payer: Medicare Other | Admitting: Physical Therapy

## 2018-11-23 ENCOUNTER — Encounter (HOSPITAL_COMMUNITY): Payer: Self-pay

## 2018-11-23 ENCOUNTER — Telehealth: Payer: Self-pay

## 2018-11-23 ENCOUNTER — Telehealth (HOSPITAL_COMMUNITY): Payer: Self-pay

## 2018-11-23 NOTE — Telephone Encounter (Signed)
Attempted to call patient in regards to Cardiac Rehab - LM on VM Mailed letter 

## 2018-11-26 ENCOUNTER — Ambulatory Visit: Payer: Medicare Other | Admitting: Physical Therapy

## 2018-11-29 ENCOUNTER — Ambulatory Visit: Payer: Medicare Other | Admitting: Physical Therapy

## 2018-12-03 ENCOUNTER — Other Ambulatory Visit: Payer: Self-pay | Admitting: Nephrology

## 2018-12-03 ENCOUNTER — Ambulatory Visit: Payer: Medicare Other | Admitting: Physical Therapy

## 2018-12-03 DIAGNOSIS — N185 Chronic kidney disease, stage 5: Secondary | ICD-10-CM

## 2018-12-03 DIAGNOSIS — D631 Anemia in chronic kidney disease: Secondary | ICD-10-CM

## 2018-12-06 ENCOUNTER — Other Ambulatory Visit: Payer: Medicare Other

## 2018-12-06 ENCOUNTER — Ambulatory Visit: Payer: Medicare Other | Admitting: Physical Therapy

## 2018-12-07 ENCOUNTER — Telehealth: Payer: Self-pay | Admitting: Physician Assistant

## 2018-12-07 NOTE — Telephone Encounter (Signed)
New message    Patient reporting pain under left breast for 1 week Patient took 1 Nitro today    1. Are you having CP right now? no  2. Are you experiencing any other symptoms (ex. SOB, nausea, vomiting, sweating)? nausea  3. How long have you been experiencing CP? 1 week  4. Is your CP continuous or coming and going? Coming and going  5. Have you taken Nitroglycerin? yes ?

## 2018-12-07 NOTE — Telephone Encounter (Signed)
Called and spoke to patient. She states that for the past few days she has had intermittent episodes of left sided chest discomfort that she describes as twinges that last for a couple of seconds. She states that she took NTG x1 this morning with relief. She states that she had some nausea after. Denies having any SOB, lightheadedness, dizziness, or any other Sx. BP 125/52. Denies any discomfort at this time. Patient is scheduled for echo and OV with [facility] next week. Instructed patient to continue to monitor and let us or her primary cardiologist know if her Sx change or worsen. She verbalized understanding.

## 2018-12-11 ENCOUNTER — Ambulatory Visit (INDEPENDENT_AMBULATORY_CARE_PROVIDER_SITE_OTHER)

## 2018-12-11 ENCOUNTER — Other Ambulatory Visit: Payer: Self-pay

## 2018-12-11 DIAGNOSIS — Z952 Presence of prosthetic heart valve: Secondary | ICD-10-CM

## 2018-12-11 DIAGNOSIS — I25119 Atherosclerotic heart disease of native coronary artery with unspecified angina pectoris: Secondary | ICD-10-CM

## 2018-12-12 ENCOUNTER — Ambulatory Visit (INDEPENDENT_AMBULATORY_CARE_PROVIDER_SITE_OTHER): Admitting: Cardiology

## 2018-12-12 ENCOUNTER — Telehealth (HOSPITAL_COMMUNITY): Payer: Self-pay

## 2018-12-12 ENCOUNTER — Encounter: Payer: Self-pay | Admitting: Cardiology

## 2018-12-12 VITALS — BP 164/72 | HR 67 | Temp 97.6°F | Ht 62.5 in | Wt 164.0 lb

## 2018-12-12 DIAGNOSIS — N184 Chronic kidney disease, stage 4 (severe): Secondary | ICD-10-CM

## 2018-12-12 DIAGNOSIS — I25119 Atherosclerotic heart disease of native coronary artery with unspecified angina pectoris: Secondary | ICD-10-CM

## 2018-12-12 DIAGNOSIS — I129 Hypertensive chronic kidney disease with stage 1 through stage 4 chronic kidney disease, or unspecified chronic kidney disease: Secondary | ICD-10-CM | POA: Diagnosis not present

## 2018-12-12 DIAGNOSIS — Z952 Presence of prosthetic heart valve: Secondary | ICD-10-CM | POA: Diagnosis not present

## 2018-12-12 DIAGNOSIS — I10 Essential (primary) hypertension: Secondary | ICD-10-CM

## 2018-12-12 MED ORDER — HYDRALAZINE HCL 25 MG PO TABS: 25.0000 mg | ORAL_TABLET | Freq: Three times a day (TID) | ORAL | 2 refills | Status: DC

## 2018-12-12 NOTE — Telephone Encounter (Signed)
Returned pt phone call, pt stated she would like to participate in CR. Patient will come in for orientation on 01/22/2019 9:00AM and will attend the 9:00AM exercise class.  Tourist information centre manager.

## 2018-12-12 NOTE — Progress Notes (Signed)
Primary Physician:  Merrilee Seashore, MD   Patient ID: [name], female    DOB: 09/02/1940, 78 y.o.   MRN: [id number]  Subjective:    Chief Complaint  Patient presents with  . Coronary Artery Disease    follow up  . Hypertension  . Aortic Stenosis    HPI: [name]  is a 78 y.o. female  with history of hypertension, hyperlipidemia, diabetes with stage III to IV chronic kidney disease (now follows Dr. Hollie Salk), CAD, and history of CVA in 2015 and again recently admitted on 09/09/18 with right posterior cerebral artery distribution stroke, and severe aortic stenosis s/p TAVR (12/14/18). She now presents for 1 month TAVR follow up and to discuss echo.  She underwent coronary angiogram on 10/23/2018 revealing at least 60% calcified stenosis of ostial RCA, intervention was not performed.    She reports that since her TAVR she has done well and is feeling much better. She does have fatigue with some activities and has been avoiding some household chores, but is planning to start doing more. She can now walk down her long driveway to her mailbox and back twice without stopping. She is going up her flight of stairs a few times a day and is tolerating this well, states it is getting easier. She is on DAPT.   She did contact CV surgery office this week for left sided chest discomfort that occurred a few times on and off, not related to exertion, but did have some mild nausea. She did have relief with Nitroglycerin. Has not had any symptoms for the last few days.   States that her blood pressure has been well controlled. She is followed by Dr. Hollie Salk for CKD stage 4, but has not yet required dialysis. She will have MRI soon for follow up on renal lesion.   Past Medical History:  Diagnosis Date  . Arthritis   . Cerebrovascular disease   . CKD (chronic kidney disease)    Sees Dr Hollie Salk  . Coronary artery disease   . Depression   . Diabetes ([place])    INSULIN DEPENDENT  . Diabetic  peripheral neuropathy ([place])   . Diabetic retinopathy ([place])   . Diverticulitis   . GERD (gastroesophageal reflux disease)   . History of CVA (cerebrovascular accident)    x 2 no residulal  . Hyperlipidemia   . Hypertension   . Obesity   . Renal lesion   . S/P TAVR (transcatheter aortic valve replacement) 11/13/2018   s/p TAVR with a 53mm Edwards S3U via the TF approach by Dr. [name] and Dr. Angelena Form  . Severe aortic stenosis     Past Surgical History:  Procedure Laterality Date  . ABDOMINAL HYSTERECTOMY    . AV FISTULA PLACEMENT Left 12/18/2017   Procedure: ARTERIOVENOUS (AV) FISTULA CREATION ARM;  Surgeon: Marty Heck, MD;  Location: [place];  Service: Vascular;  Laterality: Left;  . bilateral cataract surgery    . CARDIAC CATHETERIZATION  01/07/2014   DR Dunlap RESECTION  02/1999  . COLONOSCOPY    . COLOSTOMY  02/1999  . COLOSTOMY CLOSURE  07/1999  . EYE SURGERY Left 2019  . FISTULA SUPERFICIALIZATION Left 06/29/2018   Procedure: FISTULA SUPERFICIALIZATION LEFT BRACHIOCEPHALIC;  Surgeon: Marty Heck, MD;  Location: [place];  Service: Vascular;  Laterality: Left;  . LEFT HEART CATHETERIZATION WITH CORONARY ANGIOGRAM N/A 01/07/2014   Procedure: LEFT HEART CATHETERIZATION WITH CORONARY ANGIOGRAM;  Surgeon: Laverda Page, MD;  Location: [facility] CATH [facility];  Service: Cardiovascular;  Laterality: N/A;  . middle cerebral artery stent placement Right   . OTHER SURGICAL HISTORY     laser surgery  . PTCA  01/07/2014   DES to RCA    DR Einar Gip  . right knee surgery Right    for infection  . RIGHT/LEFT HEART CATH AND CORONARY ANGIOGRAPHY N/A 10/23/2018   Procedure: RIGHT/LEFT HEART CATH AND CORONARY ANGIOGRAPHY;  Surgeon: Nigel Mormon, MD;  Location: [facility] CV [facility];  Service: Cardiovascular;  Laterality: N/A;  . TEE WITHOUT CARDIOVERSION N/A 11/13/2018   Procedure: TRANSESOPHAGEAL ECHOCARDIOGRAM (TEE);  Surgeon: Burnell Blanks, MD;  Location: [facility];   Service: Open Heart Surgery;  Laterality: N/A;  . TRANSCATHETER AORTIC VALVE REPLACEMENT, TRANSFEMORAL  11/13/2018  . TRANSCATHETER AORTIC VALVE REPLACEMENT, TRANSFEMORAL N/A 11/13/2018   Procedure: TRANSCATHETER AORTIC VALVE REPLACEMENT, TRANSFEMORAL;  Surgeon: Burnell Blanks, MD;  Location: [facility];  Service: Open Heart Surgery;  Laterality: N/A;    Social History   Socioeconomic History  . Marital status: Widowed    Spouse name: Not on file  . Number of children: 2  . Years of education: PHD  . Highest education level: Not on file  Occupational History  . Occupation: Retired PhD Pharmacist, hospital [employer]  Social Needs  . Financial resource strain: Not on file  . Food insecurity    Worry: Not on file    Inability: Not on file  . Transportation needs    Medical: Not on file    Non-medical: Not on file  Tobacco Use  . Smoking status: Never Smoker  . Smokeless tobacco: Never Used  Substance and Sexual Activity  . Alcohol use: No  . Drug use: No  . Sexual activity: Not on file  Lifestyle  . Physical activity    Days per week: Not on file    Minutes per session: Not on file  . Stress: Not on file  Relationships  . Social Herbalist on phone: Not on file    Gets together: Not on file    Attends religious service: Not on file    Active member of club or organization: Not on file    Attends meetings of clubs or organizations: Not on file    Relationship status: Not on file  . Intimate partner violence    Fear of current or ex partner: Not on file    Emotionally abused: Not on file    Physically abused: Not on file    Forced sexual activity: Not on file  Other Topics Concern  . Not on file  Social History Narrative   Patient is widowed and lives with her two adopted children.   Patient is retired.   Patient has two children of her own and two adopted children.   Patient has a PHD.   Patient is right-handed.    Review of Systems  Constitution: Positive for  malaise/fatigue (improved). Negative for decreased appetite, weight gain and weight loss.  Eyes: Negative for visual disturbance.  Cardiovascular: Positive for dyspnea on exertion (improved). Negative for chest pain, claudication, leg swelling, orthopnea, palpitations and syncope.  Respiratory: Negative for hemoptysis and wheezing.   Endocrine: Negative for cold intolerance and heat intolerance.  Hematologic/Lymphatic: Does not bruise/bleed easily.  Skin: Negative for nail changes.  Musculoskeletal: Negative for muscle weakness and myalgias.  Gastrointestinal: Negative for abdominal pain, change in bowel habit, nausea and vomiting.  Neurological: Negative for difficulty with concentration, dizziness,  focal weakness and headaches.  Psychiatric/Behavioral: Negative for altered mental status and suicidal ideas.  All other systems reviewed and are negative.     Objective:  Blood pressure (!) 164/72, pulse 67, temperature 97.6 F (36.4 C), height 5' 2.5" (1.588 m), weight 164 lb (74.4 kg), SpO2 97 %. Body mass index is 29.52 kg/m.    Physical Exam  Constitutional: She is oriented to person, place, and time. Vital signs are normal. She appears well-developed and well-nourished.  Mildly obese  HENT:  Head: Normocephalic and atraumatic.  Neck: Normal range of motion.  Cardiovascular: Normal rate, regular rhythm and intact distal pulses.  Murmur heard.  Midsystolic murmur is present with a grade of 3/6 at the upper right sternal border radiating to the neck. Pulses:      Carotid pulses are on the right side with bruit and on the left side with bruit.      Femoral pulses are 2+ on the right side and 2+ on the left side.      Popliteal pulses are 1+ on the right side and 1+ on the left side.       Dorsalis pedis pulses are 2+ on the right side and 2+ on the left side.       Posterior tibial pulses are 2+ on the right side and 2+ on the left side.  Left upper arm AV fistula present   Pulmonary/Chest: Effort normal and breath sounds normal. No accessory muscle usage. No respiratory distress.  Abdominal: Soft. Bowel sounds are normal.  Musculoskeletal: Normal range of motion.  Neurological: She is alert and oriented to person, place, and time.  Skin: Skin is warm and dry.  Vitals reviewed.  Radiology:  MRA of head 09/10/2018:  1. Acute infarct right occipital lobe. No associated hemorrhage in the infarct. Chronic microhemorrhage right parietal white matter appears separate. 2. Right M1 stent obscures evaluation of the right middle cerebral artery on MRA 3. Right posterior cerebral artery patent. 4. Occluded distal P3 branch on the left.  Laboratory examination:    CMP Latest Ref Rng & Units 11/21/2018 11/14/2018 11/13/2018  Glucose 65 - 99 mg/dL 101(H) 92 135(H)  BUN 8 - 27 mg/dL 39(H) 36(H) 33(H)  Creatinine 0.57 - 1.00 mg/dL 2.51(H) 2.88(H) 2.90(H)  Sodium 134 - 144 mmol/L 138 137 140  Potassium 3.5 - 5.2 mmol/L 4.5 4.5 4.1  Chloride 96 - 106 mmol/L 98 105 106  CO2 20 - 29 mmol/L 27 20(L) -  Calcium 8.7 - 10.3 mg/dL 9.6 8.8(L) -  Total Protein 6.5 - 8.1 g/dL - - -  Total Bilirubin 0.3 - 1.2 mg/dL - - -  Alkaline Phos 38 - 126 U/L - - -  AST 15 - 41 U/L - - -  ALT 0 - 44 U/L - - -   CBC Latest Ref Rng & Units 11/14/2018 11/13/2018 11/13/2018  WBC 4.0 - 10.5 K/uL 10.4 - -  Hemoglobin 12.0 - 15.0 g/dL 9.9(L) 9.5(L) 9.5(L)  Hematocrit 36.0 - 46.0 % 30.9(L) 28.0(L) 28.0(L)  Platelets 150 - 400 K/uL 211 - -   Lipid Panel     Component Value Date/Time   CHOL 147 09/10/2018 0507   TRIG 64 09/10/2018 0507   HDL 76 09/10/2018 0507   CHOLHDL 1.9 09/10/2018 0507   VLDL 13 09/10/2018 0507   LDLCALC 58 09/10/2018 0507   HEMOGLOBIN A1C Lab Results  Component Value Date   HGBA1C 7.6 (H) 11/09/2018   MPG 171.42 11/09/2018   TSH  No results for input(s): TSH in the last 8760 hours.  PRN Meds:. There are no discontinued medications. Current Meds   Medication Sig  . acetaminophen (TYLENOL) 325 MG tablet Take 650 mg by mouth every 6 (six) hours as needed for moderate pain or headache.   . allopurinol (ZYLOPRIM) 100 MG tablet Take 50 mg by mouth daily.  Marland Kitchen amLODipine (NORVASC) 10 MG tablet Take 1 tablet by mouth every day (Patient taking differently: Take 10 mg by mouth daily. )  . amoxicillin (AMOXIL) 500 MG tablet Take 4 capsules (2,000mg  total) 1 hour prior to all dental visits.  Marland Kitchen aspirin EC 81 MG tablet Take 81 mg by mouth daily.  . B-D INS SYR ULTRAFINE 1CC/30G 30G X 1/2" 1 ML MISC Inject 1 each into the skin 3 (three) times daily between meals.  . calcitRIOL (ROCALTROL) 0.25 MCG capsule Take 0.25 mcg by mouth daily.   . clopidogrel (PLAVIX) 75 MG tablet Take 1 tablet (75 mg total) by mouth daily.  . folic acid (FOLVITE) 1 MG tablet Take 1 mg by mouth daily.  Marland Kitchen HUMALOG KWIKPEN 100 UNIT/ML KwikPen Inject 2-8 Units into the skin 3 (three) times daily before meals.   . insulin glargine (LANTUS) 100 UNIT/ML injection Inject 16 Units into the skin at bedtime.   Marland Kitchen labetalol (NORMODYNE) 100 MG tablet Take 100 mg by mouth 2 (two) times daily.  Marland Kitchen leflunomide (ARAVA) 10 MG tablet Take 10 mg by mouth daily.  . niacin (NIASPAN) 500 MG CR tablet Take 500 mg by mouth daily.   . nitroGLYCERIN (NITROSTAT) 0.4 MG SL tablet PLACE 1 TABLET (0.4 MG TOTAL) UNDER THE TONGUE EVERY 5 MINUTES X 3 DOSES AS NEEDED FOR CHEST PAIN. (Patient taking differently: Place 0.4 mg under the tongue every 5 (five) minutes as needed for chest pain. )  . nortriptyline (PAMELOR) 10 MG capsule Take 10 mg by mouth daily.  . ONE TOUCH ULTRA TEST test strip 1 each by Other route 3 (three) times daily between meals.  Marland Kitchen oxyCODONE-acetaminophen (PERCOCET) 5-325 MG tablet Take 1 tablet by mouth every 6 (six) hours as needed for severe pain.  . polyvinyl alcohol (TEARS AGAIN) 1.4 % ophthalmic solution Place 1 drop into both eyes 3 (three) times daily as needed for dry eyes.  Marland Kitchen PRISTIQ  100 MG 24 hr tablet Take 100 mg by mouth daily.   . rosuvastatin (CRESTOR) 20 MG tablet Take 20 mg by mouth daily.  Marland Kitchen torsemide (DEMADEX) 20 MG tablet Take 1 tablet (20 mg total) by mouth daily. May take additional dose in the afternoon for swelling, shortness of breath or weight gain. (Patient taking differently: Take 40 mg by mouth daily. May take additional dose in the afternoon for swelling, shortness of breath or weight gain.)    Cardiac Studies:   Echocardiogram 12/11/2018: Left ventricle cavity is normal in size. Moderate concentric hypertrophy of the left ventricle. Normal LV systolic function with EF 55%. Normal global wall motion. Doppler evidence of grade II (pseudonormal) diastolic dysfunction, elevated LAP.  Left atrial cavity is moderately dilated. Well seated Wende Crease Sapien 3 bioprosthetic aortic valve with normal functioning. mean PG 12 mmHg. No valvular regurgitation or paravalvular leak. Mild to moderate mitral regurgitation. Mild to moderate tricuspid regurgitation. Estimated pulmonary artery systolic pressure is 32 mmHg. Compared to previous study on 11/19/2018, estimated PASP down from 40 mmHg to 32 mmHg.  Right and left heart cath 10/23/18:  RA: 4 mmHg RV: 54/9 mmHg PA: 54/17 mmHg, mean  PAP 34 mmhg. PCWP: 25 mmHg with tall V wave  CO: 5.2 L/min CI: 3 L/min/m2  LM: Normal LAD: Normal LCx:: Normal RCA: Ostial calcific at least 60% stenosis. Ostial RPDA 20% stenosis.   Right coronary artery was difficult to engage. I had to use multiple catheters, and was finally able to engage with [facility] catheter. there was dampnening of the pressure with catheter engagement. Ostium is at least 60% stenosed with moderate calcification. There was no signifciant improvement with IC NTG. I did not perform FFR in order to limit contrast use in this patient with Cr of 3.0, not on dialysis.   Echo 08/23/2018:  1. Normal LV systolic function with EF 55%. Left ventricle cavity is  normal in size. Mild concentric hypertrophy of the left ventricle. Normal global wall motion. 2. Left atrial cavity is mildly dilated. 3. Trileaflet aortic valve. Moderate aortic annular and leaflet calcification. Severe aortic valve stenosis. Aortic valve mean gradient of 40 mmHg, Vmax of 4.0 m/s. Calculated aortic valve area by continuity equation is 0.7 cm. Moderate (Grade III) aortic regurgitation. 4. Moderate (Grade II) mitral regurgitation. 5. Moderate tricuspid regurgitation. Moderate pulmonary hypertension. Estimated pulmonary artery systolic pressure is 50 mmHg. 6. Compared to previous study in 2017, there is interval increase in severity of aortic stenosis, mitral and tricuspid regurgitation, pulmonary hypertension. Aortic regurgitation is new.  [place] Stress Test 08/06/2018: Stress EKG is non-diagnostic, as this is pharmacological stress test. Myocardial perfusion imaging is normal. Left ventricular ejection fraction is  50% with normal wall motion. Low risk study.  Carotid artery duplex 12/04/2013: No evidence of hemodynamically significant stenosis in the bilateral carotid bifurcation vessels. There is evidence of minimal heterogeneous plaque in the bilateral carotid artery.   Assessment:     ICD-10-CM   1. S/P TAVR (transcatheter aortic valve replacement)  Z95.2   2. Coronary artery disease involving native coronary artery of native heart with angina pectoris ([place])  I25.119   3. Essential hypertension  I10   4. Chronic kidney disease (CKD), stage IV (severe) (HCC)  N18.4     EKG 07/16/2018: Sinus bradycardia at 56 bpm, normal axis, poor R wave progression, cannot exclude anterior infarct old. T wave inversion/flattening in inferolateral leads, cannot exclude ischemia. Compared to EKG in Jan 2019, T wave abnormality in lateral leads not as prominent.   Recommendations:   Patient has has significant improvement in her symptoms post-TAVR. She is doing quite  well. Symptoms have improved to NYHA class 2 symptoms and KCCQ score was 55. I have encouraged her to start cardiac rehab, which she is planning to start in January. Continue with Torsemide. No evidence of decompensated heart failure. Echocardiogram without PVL and normal systolic function. She is on DAPT for 6 months post procedure, then will continue with Plavix only in view of history of CVA.   She has had a couple episodes of angina that were relieved with nitroglycerin x 1. She has not had any episodes over the last few days. Would recommend continued medical management. If she continues to have angina, will consider addition of Imdur. She is encouraged to contact me if she continues to have episodes of angina or if they increase in frequency.   Blood pressure is elevated today and was also elevated at CV surgery office. I will add hydralazine 25 mg TID. Encouraged her to follow low sodium diet to help. She will continue to follow up with Dr. Hollie Salk for evaluation of renal lesion and CKD stage 4. I  will see her back in 6 weeks for follow up.    Miquel Dunn, MSN, APRN, FNP-C [facility]. [place] Office: [phone] Fax: [phone]

## 2018-12-12 NOTE — Telephone Encounter (Signed)
Pt insurance is active and benefits verified through [facility] Medicare. Co-pay $20.00, DED $0.00/$0.00 met, out of pocket $4,000.00/$2,873.09 met, co-insurance 0%. No pre-authorization required. Passport, 12/12/2018 @ 9:33AM, ANV#[id number]

## 2018-12-28 ENCOUNTER — Other Ambulatory Visit: Payer: Medicare Other

## 2018-12-30 ENCOUNTER — Other Ambulatory Visit: Payer: Medicare Other

## 2018-12-30 ENCOUNTER — Ambulatory Visit
Admission: RE | Admit: 2018-12-30 | Discharge: 2018-12-30 | Disposition: A | Discharge status: Home / Self Care | Payer: Medicare Other | Source: Ambulatory Visit | Attending: Nephrology | Admitting: Nephrology

## 2018-12-30 ENCOUNTER — Other Ambulatory Visit: Payer: Self-pay

## 2018-12-30 DIAGNOSIS — N185 Chronic kidney disease, stage 5: Secondary | ICD-10-CM

## 2018-12-30 DIAGNOSIS — D631 Anemia in chronic kidney disease: Secondary | ICD-10-CM

## 2019-01-03 ENCOUNTER — Other Ambulatory Visit: Payer: Self-pay | Admitting: Cardiology

## 2019-01-04 ENCOUNTER — Telehealth (HOSPITAL_COMMUNITY): Payer: Self-pay

## 2019-01-04 NOTE — Telephone Encounter (Signed)
Called pt to let her know that the cardiac rehab department is closing starting Jan 21, 2019 from 30-60 days and that she can still come to her orientation appointment and that I was canceling her exercise appointments. Pt understood and will be attending the orientation appointment.

## 2019-01-15 ENCOUNTER — Telehealth: Payer: Self-pay

## 2019-01-16 ENCOUNTER — Telehealth (HOSPITAL_COMMUNITY): Payer: Self-pay

## 2019-01-16 NOTE — Telephone Encounter (Signed)
Cardiac Rehab Medication Review by a Pharmacist  Does the patient  feel that his/her medications are working for him/her?  yes  Has the patient been experiencing any side effects to the medications prescribed?  no  Does the patient measure his/her own blood pressure or blood glucose at home?  yes - Diastolic blood pressure is improving and reported as 50-60s.(this is higher than DBP was before the surgery). Blood glucose is usually 96-102 in the morning when first waking up. And patient reports taking blood glucose approximately 3 times per day.   Does the patient have any problems obtaining medications due to transportation or finances?   no  Understanding of regimen: excellent Understanding of indications: excellent Potential of compliance: excellent    Pharmacist comments: N/a  Cristela Felt, PharmD PGY1 Pharmacy Resident  01/16/2019 10:28 AM

## 2019-01-21 ENCOUNTER — Telehealth (HOSPITAL_COMMUNITY): Payer: Self-pay

## 2019-01-21 NOTE — Telephone Encounter (Signed)
Successful telephone encounter to [name] to confirm Cardiac Rehab orientation appointment for 01/21/18 at 0900. Nursing assessment completed. Instructions for appointment provided. Patient screening for Covid-19 negative. Susan Jeanbaptiste E. [name], BSN

## 2019-01-22 ENCOUNTER — Other Ambulatory Visit: Payer: Self-pay

## 2019-01-22 ENCOUNTER — Encounter (HOSPITAL_COMMUNITY)
Admission: RE | Admit: 2019-01-22 | Discharge: 2019-01-22 | Disposition: A | Discharge status: Home / Self Care | Payer: Medicare PPO | Source: Ambulatory Visit | Attending: Cardiology | Admitting: Cardiology

## 2019-01-22 VITALS — BP 118/64 | HR 61 | Temp 96.0°F | Ht 63.0 in | Wt 167.3 lb

## 2019-01-22 DIAGNOSIS — Z952 Presence of prosthetic heart valve: Secondary | ICD-10-CM | POA: Insufficient documentation

## 2019-01-22 NOTE — Progress Notes (Signed)
Spoke to pt regarding Virtual Cardiac  and Pulmonary Rehab.  Pt  was able to download the [employer] app on their smart device with no issues. Pt set up their account and received the following welcome message -"Welcome to the [facility] and Pulmonary Rehabilitation program. We hope that you will find the exercise program beneficial in your recovery process. Our staff is available to assist with any questions/concerns about your exercise routine. Best wishes". Brief orientation provided to with the advisement to watch the "Intro to Rehab" series located under the Resource tab. Pt verbalized understanding. Will continue to follow and monitor pt progress with feedback as needed. Lexis Potenza E. Laray Anger, BSN

## 2019-01-22 NOTE — Progress Notes (Signed)
Reviewed home exercise guidelines with patient including endpoints, temperature precautions, target heart rate and rate of perceived exertion. Pt is currently walking 5-10 minutes, 3 days/week as her mode of home exercise.  Discussed increasing walking duration to 5-10 minutes 3 times/day, 3 days/week with a goal of increasing to 5-7 days/week, and pt is amenable to this. Pt also has light hand weights at home that she plans to use for her resistance training. Pt given handout on stretching and hand weight exercises. Pt will log her exercise in the virtual cardiac rehab app. Pt voices understanding of instructions given.  Sol Passer, MS, ACSM CEP

## 2019-01-22 NOTE — Progress Notes (Signed)
         Confirm Consent - In the setting of the current Covid19 crisis, you are scheduled for a phone visit with your Cardiac or Pulmonary team member.  Just as we do with many in-gym visits, in order for you to participate in this visit, we must obtain consent.  If you'd like, I can send this to your mychart (if signed up) or email for you to review.  Otherwise, I can obtain your verbal consent now.  By agreeing to a telephone visit, we'd like you to understand that the technology does not allow for your Cardiac or Pulmonary Rehab team member to perform a physical assessment, and thus may limit their ability to fully assess your ability to perform exercise programs. If your provider identifies any concerns that need to be evaluated in person, we will make arrangements to do so.  Finally, though the technology is pretty good, we cannot assure that it will always work on either your or our end and we cannot ensure that we have a secure connection.  Cardiac and Pulmonary Rehab Telehealth visits and "At Home" cardiac and pulmonary rehab are provided at no cost to you.        Are you willing to proceed?"        STAFF: Did the patient verbally acknowledge consent to telehealth visit? Document YES/NO here: Yes     [name] E. Laray Anger, BSN  Cardiac and Pulmonary Rehab Staff        Date 01/22/19 @ Time [phone]

## 2019-01-23 ENCOUNTER — Ambulatory Visit: Payer: Medicare PPO | Admitting: Cardiology

## 2019-01-23 ENCOUNTER — Encounter: Payer: Self-pay | Admitting: Cardiology

## 2019-01-23 ENCOUNTER — Encounter (HOSPITAL_COMMUNITY): Payer: Self-pay

## 2019-01-23 VITALS — BP 143/53 | HR 59 | Temp 97.1°F | Resp 16 | Ht 63.0 in | Wt 162.3 lb

## 2019-01-23 DIAGNOSIS — I2510 Atherosclerotic heart disease of native coronary artery without angina pectoris: Secondary | ICD-10-CM | POA: Diagnosis not present

## 2019-01-23 DIAGNOSIS — N184 Chronic kidney disease, stage 4 (severe): Secondary | ICD-10-CM | POA: Diagnosis not present

## 2019-01-23 DIAGNOSIS — Z952 Presence of prosthetic heart valve: Secondary | ICD-10-CM

## 2019-01-23 DIAGNOSIS — I129 Hypertensive chronic kidney disease with stage 1 through stage 4 chronic kidney disease, or unspecified chronic kidney disease: Secondary | ICD-10-CM | POA: Diagnosis not present

## 2019-01-23 DIAGNOSIS — I10 Essential (primary) hypertension: Secondary | ICD-10-CM

## 2019-01-23 NOTE — Progress Notes (Signed)
Primary Physician:  Merrilee Seashore, MD   Patient ID: [name], female    DOB: 04-09-1940, 79 y.o.   MRN: [id number]  Subjective:    Chief Complaint  Patient presents with  . transcatheter aortic valve replacement  . Hypertension    HPI: [name]  is a 79 y.o. female  with history of hypertension, hyperlipidemia, diabetes with stage III to IV chronic kidney disease (now follows Dr. Hollie Salk), CAD, and history of CVA in 2015 and again recently admitted on 09/09/18 with right posterior cerebral artery distribution stroke, and severe aortic stenosis s/p TAVR (12/14/18). She now presents for 1 month TAVR follow up and to discuss echo.  She underwent coronary angiogram on 10/23/2018 revealing at least 60% calcified stenosis of ostial RCA, intervention was not performed.    She now presents for 6 week follow up. She reports that her symptoms have continued to improve and she is able to do more than she was a few weeks ago. She is starting cardiac rehab in the next few days.    She has not had any episodes of angina.    States that her blood pressure has been well controlled. She is followed by Dr. Hollie Salk for CKD stage 4, but has not yet required dialysis. Recent MRI showed stable renal cyst.   Past Medical History:  Diagnosis Date  . Arthritis   . Cerebrovascular disease   . CKD (chronic kidney disease)    Sees Dr Hollie Salk  . Coronary artery disease   . Depression   . Diabetes ([place])    INSULIN DEPENDENT  . Diabetic peripheral neuropathy ([place])   . Diabetic retinopathy ([place])   . Diverticulitis   . GERD (gastroesophageal reflux disease)   . History of CVA (cerebrovascular accident)    x 2 no residulal  . Hyperlipidemia   . Hypertension   . Obesity   . Renal lesion   . S/P TAVR (transcatheter aortic valve replacement) 11/13/2018   s/p TAVR with a 52mm Edwards S3U via the TF approach by Dr. [name] and Dr. Angelena Form  . Severe aortic stenosis     Past Surgical History:    Procedure Laterality Date  . ABDOMINAL HYSTERECTOMY    . AV FISTULA PLACEMENT Left 12/18/2017   Procedure: ARTERIOVENOUS (AV) FISTULA CREATION ARM;  Surgeon: Marty Heck, MD;  Location: [place];  Service: Vascular;  Laterality: Left;  . bilateral cataract surgery    . CARDIAC CATHETERIZATION  01/07/2014   DR Moody RESECTION  02/1999  . COLONOSCOPY    . COLOSTOMY  02/1999  . COLOSTOMY CLOSURE  07/1999  . EYE SURGERY Left 2019  . FISTULA SUPERFICIALIZATION Left 06/29/2018   Procedure: FISTULA SUPERFICIALIZATION LEFT BRACHIOCEPHALIC;  Surgeon: Marty Heck, MD;  Location: [place];  Service: Vascular;  Laterality: Left;  . LEFT HEART CATHETERIZATION WITH CORONARY ANGIOGRAM N/A 01/07/2014   Procedure: LEFT HEART CATHETERIZATION WITH CORONARY ANGIOGRAM;  Surgeon: Laverda Page, MD;  Location: [facility] C[facility] LAB;  Service: Cardiovascular;  Laterality: N/A;  . middle cerebral artery stent placement Right   . OTHER SURGICAL HISTORY     laser surgery  . PTCA  01/07/2014   DES to RCA    DR Einar Gip  . right knee surgery Right    for infection  . RIGHT/LEFT HEART CATH AND CORONARY ANGIOGRAPHY N/A 10/23/2018   Procedure: RIGHT/LEFT HEART CATH AND CORONARY ANGIOGRAPHY;  Surgeon: Nigel Mormon, MD;  Location: [facility] CV LAB;  Service: Cardiovascular;  Laterality: N/A;  . TEE WITHOUT CARDIOVERSION N/A 11/13/2018   Procedure: TRANSESOPHAGEAL ECHOCARDIOGRAM (TEE);  Surgeon: Burnell Blanks, MD;  Location: [facility] [place];  Service: Open Heart Surgery;  Laterality: N/A;  . TRANSCATHETER AORTIC VALVE REPLACEMENT, TRANSFEMORAL  11/13/2018  . TRANSCATHETER AORTIC VALVE REPLACEMENT, TRANSFEMORAL N/A 11/13/2018   Procedure: TRANSCATHETER AORTIC VALVE REPLACEMENT, TRANSFEMORAL;  Surgeon: Burnell Blanks, MD;  Location: [place];  Service: Open Heart Surgery;  Laterality: N/A;    Social History   Socioeconomic History  . Marital status: Widowed    Spouse name: Not on file  .  Number of children: 2  . Years of education: PHD  . Highest education level: Not on file  Occupational History  . Occupation: Retired PhD Pharmacist, hospital [employer]  Tobacco Use  . Smoking status: Never Smoker  . Smokeless tobacco: Never Used  Substance and Sexual Activity  . Alcohol use: No  . Drug use: No  . Sexual activity: Not on file  Other Topics Concern  . Not on file  Social History Narrative   Patient is widowed and lives with her two adopted children.   Patient is retired.   Patient has two children of her own and two adopted children.   Patient has a PHD.   Patient is right-handed.   Social Determinants of Health   Financial Resource Strain:   . Difficulty of Paying Living Expenses: Not on file  Food Insecurity:   . Worried About Charity fundraiser in the Last Year: Not on file  . Ran Out of Food in the Last Year: Not on file  Transportation Needs:   . Lack of Transportation (Medical): Not on file  . Lack of Transportation (Non-Medical): Not on file  Physical Activity:   . Days of Exercise per Week: Not on file  . Minutes of Exercise per Session: Not on file  Stress:   . Feeling of Stress : Not on file  Social Connections:   . Frequency of Communication with Friends and Family: Not on file  . Frequency of Social Gatherings with Friends and Family: Not on file  . Attends Religious Services: Not on file  . Active Member of Clubs or Organizations: Not on file  . Attends Archivist Meetings: Not on file  . Marital Status: Not on file  Intimate Partner Violence:   . Fear of Current or Ex-Partner: Not on file  . Emotionally Abused: Not on file  . Physically Abused: Not on file  . Sexually Abused: Not on file    Review of Systems  Constitution: Positive for malaise/fatigue (improved). Negative for decreased appetite, weight gain and weight loss.  Eyes: Negative for visual disturbance.  Cardiovascular: Positive for dyspnea on exertion (improved). Negative for  chest pain, claudication, leg swelling, orthopnea, palpitations and syncope.  Respiratory: Negative for hemoptysis and wheezing.   Endocrine: Negative for cold intolerance and heat intolerance.  Hematologic/Lymphatic: Does not bruise/bleed easily.  Skin: Negative for nail changes.  Musculoskeletal: Negative for muscle weakness and myalgias.  Gastrointestinal: Negative for abdominal pain, change in bowel habit, nausea and vomiting.  Neurological: Negative for difficulty with concentration, dizziness, focal weakness and headaches.  Psychiatric/Behavioral: Negative for altered mental status and suicidal ideas.  All other systems reviewed and are negative.     Objective:  Blood pressure (!) 143/53, pulse (!) 59, temperature (!) 97.1 F (36.2 C), temperature source Temporal, resp. rate 16, height 5\' 3"  (1.6 m), weight 162 lb 4.8 oz (73.6  kg), SpO2 98 %. Body mass index is 28.75 kg/m.    Physical Exam  Constitutional: She is oriented to person, place, and time. Vital signs are normal. She appears well-developed and well-nourished.  Mildly obese  HENT:  Head: Normocephalic and atraumatic.  Cardiovascular: Normal rate, regular rhythm and intact distal pulses.  Murmur heard.  Midsystolic murmur is present with a grade of 3/6 at the upper right sternal border radiating to the neck. Pulses:      Carotid pulses are on the right side with bruit and on the left side with bruit.      Femoral pulses are 2+ on the right side and 2+ on the left side.      Popliteal pulses are 1+ on the right side and 1+ on the left side.       Dorsalis pedis pulses are 2+ on the right side and 2+ on the left side.       Posterior tibial pulses are 2+ on the right side and 2+ on the left side.  Left upper arm AV fistula present  Pulmonary/Chest: Effort normal and breath sounds normal. No accessory muscle usage. No respiratory distress.  Abdominal: Soft. Bowel sounds are normal.  Musculoskeletal:        General:  Normal range of motion.     Cervical back: Normal range of motion.  Neurological: She is alert and oriented to person, place, and time.  Skin: Skin is warm and dry.  Vitals reviewed.  Radiology:  MRA of head 09/10/2018:  1. Acute infarct right occipital lobe. No associated hemorrhage in the infarct. Chronic microhemorrhage right parietal white matter appears separate. 2. Right M1 stent obscures evaluation of the right middle cerebral artery on MRA 3. Right posterior cerebral artery patent. 4. Occluded distal P3 branch on the left.  Laboratory examination:    CMP Latest Ref Rng & Units 11/21/2018 11/14/2018 11/13/2018  Glucose 65 - 99 mg/dL 101(H) 92 135(H)  BUN 8 - 27 mg/dL 39(H) 36(H) 33(H)  Creatinine 0.57 - 1.00 mg/dL 2.51(H) 2.88(H) 2.90(H)  Sodium 134 - 144 mmol/L 138 137 140  Potassium 3.5 - 5.2 mmol/L 4.5 4.5 4.1  Chloride 96 - 106 mmol/L 98 105 106  CO2 20 - 29 mmol/L 27 20(L) -  Calcium 8.7 - 10.3 mg/dL 9.6 8.8(L) -  Total Protein 6.5 - 8.1 g/dL - - -  Total Bilirubin 0.3 - 1.2 mg/dL - - -  Alkaline Phos 38 - 126 U/L - - -  AST 15 - 41 U/L - - -  ALT 0 - 44 U/L - - -   CBC Latest Ref Rng & Units 11/14/2018 11/13/2018 11/13/2018  WBC 4.0 - 10.5 K/uL 10.4 - -  Hemoglobin 12.0 - 15.0 g/dL 9.9(L) 9.5(L) 9.5(L)  Hematocrit 36.0 - 46.0 % 30.9(L) 28.0(L) 28.0(L)  Platelets 150 - 400 K/uL 211 - -   Lipid Panel     Component Value Date/Time   CHOL 147 09/10/2018 0507   TRIG 64 09/10/2018 0507   HDL 76 09/10/2018 0507   CHOLHDL 1.9 09/10/2018 0507   VLDL 13 09/10/2018 0507   LDLCALC 58 09/10/2018 0507   HEMOGLOBIN A1C Lab Results  Component Value Date   HGBA1C 7.6 (H) 11/09/2018   MPG 171.42 11/09/2018   TSH No results for input(s): TSH in the last 8760 hours.  PRN Meds:. Medications Discontinued During This Encounter  Medication Reason  . amoxicillin (AMOXIL) 500 MG tablet Error  . oxyCODONE-acetaminophen (PERCOCET) 5-325 MG tablet  Error   Current Meds   Medication Sig  . acetaminophen (TYLENOL) 325 MG tablet Take 650 mg by mouth every 6 (six) hours as needed for moderate pain or headache.   . allopurinol (ZYLOPRIM) 100 MG tablet Take 50 mg by mouth daily.  Marland Kitchen amLODipine (NORVASC) 10 MG tablet Take 1 tablet by mouth every day (Patient taking differently: Take 10 mg by mouth daily. )  . aspirin EC 81 MG tablet Take 81 mg by mouth daily.  . B-D INS SYR ULTRAFINE 1CC/30G 30G X 1/2" 1 ML MISC Inject 1 each into the skin 3 (three) times daily between meals.  . calcitRIOL (ROCALTROL) 0.25 MCG capsule Take 0.25 mcg by mouth daily.   . clopidogrel (PLAVIX) 75 MG tablet Take 1 tablet (75 mg total) by mouth daily.  . folic acid (FOLVITE) 1 MG tablet Take 1 mg by mouth daily.  Marland Kitchen HUMALOG KWIKPEN 100 UNIT/ML KwikPen Inject 2-8 Units into the skin 3 (three) times daily before meals.   . hydrALAZINE (APRESOLINE) 25 MG tablet Take 1 tablet (25 mg total) by mouth 3 (three) times daily.  . insulin glargine (LANTUS) 100 UNIT/ML injection Inject 16 Units into the skin at bedtime.   Marland Kitchen labetalol (NORMODYNE) 100 MG tablet Take 100 mg by mouth 2 (two) times daily.  Marland Kitchen leflunomide (ARAVA) 10 MG tablet Take 10 mg by mouth daily.  . niacin (NIASPAN) 500 MG CR tablet Take 500 mg by mouth daily.   . nitroGLYCERIN (NITROSTAT) 0.4 MG SL tablet PLACE 1 TABLET (0.4 MG TOTAL) UNDER THE TONGUE EVERY 5 MINUTES X 3 DOSES AS NEEDED FOR CHEST PAIN.  Marland Kitchen nortriptyline (PAMELOR) 10 MG capsule Take 10 mg by mouth daily.  . ONE TOUCH ULTRA TEST test strip 1 each by Other route 3 (three) times daily between meals.  . polyvinyl alcohol (TEARS AGAIN) 1.4 % ophthalmic solution Place 1 drop into both eyes 3 (three) times daily as needed for dry eyes.  Marland Kitchen PRISTIQ 100 MG 24 hr tablet Take 100 mg by mouth daily.   . rosuvastatin (CRESTOR) 20 MG tablet Take 20 mg by mouth daily.  Marland Kitchen torsemide (DEMADEX) 20 MG tablet Take 1 tablet (20 mg total) by mouth daily. May take additional dose in the afternoon  for swelling, shortness of breath or weight gain.  . [DISCONTINUED] oxyCODONE-acetaminophen (PERCOCET) 5-325 MG tablet Take 1 tablet by mouth every 6 (six) hours as needed for severe pain.    Cardiac Studies:   Echocardiogram 12/11/2018: Left ventricle cavity is normal in size. Moderate concentric hypertrophy of the left ventricle. Normal LV systolic function with EF 55%. Normal global wall motion. Doppler evidence of grade II (pseudonormal) diastolic dysfunction, elevated LAP.  Left atrial cavity is moderately dilated. Well seated Wende Crease Sapien 3 bioprosthetic aortic valve with normal functioning. mean PG 12 mmHg. No valvular regurgitation or paravalvular leak. Mild to moderate mitral regurgitation. Mild to moderate tricuspid regurgitation. Estimated pulmonary artery systolic pressure is 32 mmHg. Compared to previous study on 11/19/2018, estimated PASP down from 40 mmHg to 32 mmHg.  Right and left heart cath 10/23/18:  RA: 4 mmHg RV: 54/9 mmHg PA: 54/17 mmHg, mean PAP 34 mmhg. PCWP: 25 mmHg with tall V wave  CO: 5.2 L/min CI: 3 L/min/m2  LM: Normal LAD: Normal LCx:: Normal RCA: Ostial calcific at least 60% stenosis. Ostial RPDA 20% stenosis.   Right coronary artery was difficult to engage. I had to use multiple catheters, and was finally able to engage with  Tivoli catheter. there was dampnening of the pressure with catheter engagement. Ostium is at least 60% stenosed with moderate calcification. There was no signifciant improvement with IC NTG. I did not perform FFR in order to limit contrast use in this patient with Cr of 3.0, not on dialysis.   Echo 08/23/2018:  1. Normal LV systolic function with EF 55%. Left ventricle cavity is normal in size. Mild concentric hypertrophy of the left ventricle. Normal global wall motion. 2. Left atrial cavity is mildly dilated. 3. Trileaflet aortic valve. Moderate aortic annular and leaflet calcification. Severe aortic valve stenosis.  Aortic valve mean gradient of 40 mmHg, Vmax of 4.0 m/s. Calculated aortic valve area by continuity equation is 0.7 cm. Moderate (Grade III) aortic regurgitation. 4. Moderate (Grade II) mitral regurgitation. 5. Moderate tricuspid regurgitation. Moderate pulmonary hypertension. Estimated pulmonary artery systolic pressure is 50 mmHg. 6. Compared to previous study in 2017, there is interval increase in severity of aortic stenosis, mitral and tricuspid regurgitation, pulmonary hypertension. Aortic regurgitation is new.  [facility] Stress Test 08/06/2018: Stress EKG is non-diagnostic, as this is pharmacological stress test. Myocardial perfusion imaging is normal. Left ventricular ejection fraction is  50% with normal wall motion. Low risk study.  Carotid artery duplex 12/04/2013: No evidence of hemodynamically significant stenosis in the bilateral carotid bifurcation vessels. There is evidence of minimal heterogeneous plaque in the bilateral carotid artery.   Assessment:     ICD-10-CM   1. S/P TAVR (transcatheter aortic valve replacement)  Z95.2 CANCELED: EKG 12-Lead  2. Coronary artery disease involving native coronary artery of native heart without angina pectoris  I25.10   3. Essential hypertension  I10   4. Chronic kidney disease (CKD), stage IV (severe) (HCC)  N18.4     EKG 07/16/2018: Sinus bradycardia at 56 bpm, normal axis, poor R wave progression, cannot exclude anterior infarct old. T wave inversion/flattening in inferolateral leads, cannot exclude ischemia. Compared to EKG in Jan 2019, T wave abnormality in lateral leads not as prominent.   Recommendations:   She has had continued improvement in her symptoms since her TAVR procedure.  I am overall very pleased with her progress.  Has not had any recurrence of angina.  Blood pressure is much better controlled with hydralazine.  We will continue with aggressive medical management.  Encouraged her to contact me if she has  recurrence of chest pain.  She is planning to start cardiac rehab in the next 1 week, hopefully her symptoms of dyspnea will continue to improve.  She will continue to follow with nephrology for management of chronic kidney disease.  I will plan to see her back in 3 months for continued follow-up, but encouraged her to contact me sooner if needed.   Miquel Dunn, MSN, APRN, FNP-C [facility]. [place] Office: [phone] Fax: [phone]

## 2019-01-23 NOTE — Progress Notes (Signed)
Cardiac Individual Treatment Plan  Patient Details  Name: [name] MRN: [id number] Date of Birth: [birth year] Referring Provider:     [facility] from 01/22/2019 in [place]  Referring Provider  [place]rook. Manish      Initial Encounter Date:    CARDIAC REHAB PHASE II ORIENTATION from 01/22/2019 in [place]  Date  01/22/19      Visit Diagnosis: 11/13/18 TAVR  Patient's Home Medications on Admission:  Current Outpatient Medications:  .  acetaminophen (TYLENOL) 325 MG tablet, Take 650 mg by mouth every 6 (six) hours as needed for moderate pain or headache. , Disp: , Rfl:  .  allopurinol (ZYLOPRIM) 100 MG tablet, Take 50 mg by mouth daily., Disp: , Rfl:  .  amLODipine (NORVASC) 10 MG tablet, Take 1 tablet by mouth every day (Patient taking differently: Take 10 mg by mouth daily. ), Disp: 30 tablet, Rfl: 11 .  amoxicillin (AMOXIL) 500 MG tablet, Take 4 capsules (2,000mg  total) 1 hour prior to all dental visits., Disp: 8 tablet, Rfl: 10 .  aspirin EC 81 MG tablet, Take 81 mg by mouth daily., Disp: , Rfl:  .  B-D INS SYR ULTRAFINE 1CC/30G 30G X 1/2" 1 ML MISC, Inject 1 each into the skin 3 (three) times daily between meals., Disp: , Rfl:  .  calcitRIOL (ROCALTROL) 0.25 MCG capsule, Take 0.25 mcg by mouth daily. , Disp: , Rfl:  .  clopidogrel (PLAVIX) 75 MG tablet, Take 1 tablet (75 mg total) by mouth daily., Disp: 90 tablet, Rfl: 1 .  folic acid (FOLVITE) 1 MG tablet, Take 1 mg by mouth daily., Disp: , Rfl:  .  HUMALOG KWIKPEN 100 UNIT/ML KwikPen, Inject 2-8 Units into the skin 3 (three) times daily before meals. , Disp: , Rfl:  .  hydrALAZINE (APRESOLINE) 25 MG tablet, Take 1 tablet (25 mg total) by mouth 3 (three) times daily., Disp: 90 tablet, Rfl: 2 .  insulin glargine (LANTUS) 100 UNIT/ML injection, Inject 16 Units into the skin at bedtime. , Disp: , Rfl:  .  labetalol (NORMODYNE) 100 MG tablet,  Take 100 mg by mouth 2 (two) times daily., Disp: , Rfl:  .  leflunomide (ARAVA) 10 MG tablet, Take 10 mg by mouth daily., Disp: , Rfl:  .  niacin (NIASPAN) 500 MG CR tablet, Take 500 mg by mouth daily. , Disp: , Rfl:  .  nitroGLYCERIN (NITROSTAT) 0.4 MG SL tablet, PLACE 1 TABLET (0.4 MG TOTAL) UNDER THE TONGUE EVERY 5 MINUTES X 3 DOSES AS NEEDED FOR CHEST PAIN., Disp: 25 tablet, Rfl: 1 .  nortriptyline (PAMELOR) 10 MG capsule, Take 10 mg by mouth daily., Disp: , Rfl:  .  ONE TOUCH ULTRA TEST test strip, 1 each by Other route 3 (three) times daily between meals., Disp: , Rfl:  .  polyvinyl alcohol (TEARS AGAIN) 1.4 % ophthalmic solution, Place 1 drop into both eyes 3 (three) times daily as needed for dry eyes., Disp: , Rfl:  .  PRISTIQ 100 MG 24 hr tablet, Take 100 mg by mouth daily. , Disp: , Rfl:  .  rosuvastatin (CRESTOR) 20 MG tablet, Take 20 mg by mouth daily., Disp: , Rfl:  .  torsemide (DEMADEX) 20 MG tablet, Take 1 tablet (20 mg total) by mouth daily. May take additional dose in the afternoon for swelling, shortness of breath or weight gain., Disp: 180 tablet, Rfl: 0 .  oxyCODONE-acetaminophen (PERCOCET) 5-325 MG  tablet, Take 1 tablet by mouth every 6 (six) hours as needed for severe pain. (Patient not taking: Reported on 01/16/2019), Disp: 12 tablet, Rfl: 0  Past Medical History: Past Medical History:  Diagnosis Date  . Arthritis   . Cerebrovascular disease   . CKD (chronic kidney disease)    Sees Dr Hollie Salk  . Coronary artery disease   . Depression   . Diabetes ([place])    INSULIN DEPENDENT  . Diabetic peripheral neuropathy ([place])   . Diabetic retinopathy ([place])   . Diverticulitis   . GERD (gastroesophageal reflux disease)   . History of CVA (cerebrovascular accident)    x 2 no residulal  . Hyperlipidemia   . Hypertension   . Obesity   . Renal lesion   . S/P TAVR (transcatheter aortic valve replacement) 11/13/2018   s/p TAVR with a 58mm Edwards S3U via the TF approach by Dr. [name]  and Dr. Angelena Form  . Severe aortic stenosis     Tobacco Use: Social History   Tobacco Use  Smoking Status Never Smoker  Smokeless Tobacco Never Used    Labs: Recent Review Flowsheet Data    Labs for ITP Cardiac and Pulmonary Rehab Latest Ref Rng & Units 10/23/2018 11/09/2018 11/13/2018 11/13/2018 11/13/2018   Cholestrol 0 - 200 mg/dL - - - - -   LDLCALC 0 - 99 mg/dL - - - - -   HDL >40 mg/dL - - - - -   Trlycerides <150 mg/dL - - - - -   Hemoglobin A1c 4.8 - 5.6 % - 7.6(H) - - -   PHART 7.350 - 7.450 - - - - -   PCO2ART 32.0 - 48.0 mmHg - - - - -   HCO3 20.0 - 28.0 mmol/L 22.4 - - - -   TCO2 22 - 32 mmol/L 23 - 22 22 23    ACIDBASEDEF 0.0 - 2.0 mmol/L 2.0 - - - -   O2SAT % 64.0 - - - -      Capillary Blood Glucose: Lab Results  Component Value Date   GLUCAP 113 (H) 11/14/2018   GLUCAP 90 11/14/2018   GLUCAP 130 (H) 11/13/2018   GLUCAP 233 (H) 11/13/2018   GLUCAP 135 (H) 11/13/2018     Exercise Target Goals: Exercise Program Goal: Individual exercise prescription set using results from initial 6 min walk test and THRR while considering  patient's activity barriers and safety.   Exercise Prescription Goal: Starting with aerobic activity 30 plus minutes a day, 3 days per week for initial exercise prescription. Provide home exercise prescription and guidelines that participant acknowledges understanding prior to discharge.  Activity Barriers & Risk Stratification: Activity Barriers & Cardiac Risk Stratification - 01/22/19 0926      Activity Barriers & Cardiac Risk Stratification   Activity Barriers  Other (comment)    Comments  Some left sided weakness from stoke in August 2020.    Cardiac Risk Stratification  High       6 Minute Walk: 6 Minute Walk    Row Name 01/22/19 0936         6 Minute Walk   Phase  Initial     Distance  1400 feet     Walk Time  6 minutes     # of Rest Breaks  0     MPH  2.65     METS  2.24     RPE  11     Perceived Dyspnea   0  VO2 Peak  7.83     Symptoms  No     Resting HR  61 bpm     Resting BP  118/64     Resting Oxygen Saturation   100 %     Exercise Oxygen Saturation  during 6 min walk  100 %     Max Ex. HR  79 bpm     Max Ex. BP  148/42     2 Minute Post BP  154/52        Oxygen Initial Assessment:   Oxygen Re-Evaluation:   Oxygen Discharge (Final Oxygen Re-Evaluation):   Initial Exercise Prescription: Initial Exercise Prescription - 01/22/19 1200      Date of Initial Exercise RX and Referring Provider   Date  01/22/19    Referring Provider  Patwardhan. Manish      Track   Minutes  30      Prescription Details   Frequency (times per week)  3-5    Duration  Progress to 30 minutes of continuous aerobic without signs/symptoms of physical distress      Intensity   THRR 40-80% of Max Heartrate  56-112    Ratings of Perceived Exertion  11-13    Perceived Dyspnea  0-4      Progression   Progression  Continue to progress workloads to maintain intensity without signs/symptoms of physical distress.      Resistance Training   Training Prescription  Yes    Weight  1-2lbs    Reps  10-15       Perform Capillary Blood Glucose checks as needed.  Exercise Prescription Changes:   Exercise Comments: Exercise Comments    Row Name 01/22/19 [id number]           Exercise Comments  Reviewed home exericse plan with patient.          Exercise Goals and Review: Exercise Goals    Row Name 01/22/19 0926             Exercise Goals   Increase Physical Activity  Yes       Intervention  Provide advice, education, support and counseling about physical activity/exercise needs.;Develop an individualized exercise prescription for aerobic and resistive training based on initial evaluation findings, risk stratification, comorbidities and participant's personal goals.       Expected Outcomes  Short Term: Attend rehab on a regular basis to increase amount of physical activity.;Long Term: Exercising  regularly at least 3-5 days a week.;Long Term: Add in home exercise to make exercise part of routine and to increase amount of physical activity.       Increase Strength and Stamina  Yes       Intervention  Provide advice, education, support and counseling about physical activity/exercise needs.;Develop an individualized exercise prescription for aerobic and resistive training based on initial evaluation findings, risk stratification, comorbidities and participant's personal goals.       Expected Outcomes  Short Term: Increase workloads from initial exercise prescription for resistance, speed, and METs.;Short Term: Perform resistance training exercises routinely during rehab and add in resistance training at home;Long Term: Improve cardiorespiratory fitness, muscular endurance and strength as measured by increased METs and functional capacity (6MWT)       Able to understand and use rate of perceived exertion (RPE) scale  Yes       Intervention  Provide education and explanation on how to use RPE scale       Expected Outcomes  Short Term: Able to use  RPE daily in rehab to express subjective intensity level;Long Term:  Able to use RPE to guide intensity level when exercising independently       Knowledge and understanding of Target Heart Rate Range (THRR)  Yes       Intervention  Provide education and explanation of THRR including how the numbers were predicted and where they are located for reference       Expected Outcomes  Short Term: Able to state/look up THRR;Long Term: Able to use THRR to govern intensity when exercising independently;Short Term: Able to use daily as guideline for intensity in rehab       Able to check pulse independently  Yes       Intervention  Provide education and demonstration on how to check pulse in carotid and radial arteries.;Review the importance of being able to check your own pulse for safety during independent exercise       Expected Outcomes  Short Term: Able to explain  why pulse checking is important during independent exercise;Long Term: Able to check pulse independently and accurately       Understanding of Exercise Prescription  Yes       Intervention  Provide education, explanation, and written materials on patient's individual exercise prescription       Expected Outcomes  Short Term: Able to explain program exercise prescription;Long Term: Able to explain home exercise prescription to exercise independently          Exercise Goals Re-Evaluation :    Discharge Exercise Prescription (Final Exercise Prescription Changes):   Nutrition:  Target Goals: Understanding of nutrition guidelines, daily intake of sodium 1500mg , cholesterol 200mg , calories 30% from fat and 7% or less from saturated fats, daily to have 5 or more servings of fruits and vegetables.  Biometrics: Pre Biometrics - 01/22/19 0926      Pre Biometrics   Height  5\' 3"  (1.6 m)    Weight  75.9 kg    Waist Circumference  37.25 inches    Hip Circumference  42.25 inches    Waist to Hip Ratio  0.88 %    BMI (Calculated)  29.65    Triceps Skinfold  36 mm    % Body Fat  43.2 %    Grip Strength  27.5 kg    Flexibility  0 in    Single Leg Stand  3.12 seconds        Nutrition Therapy Plan and Nutrition Goals:   Nutrition Assessments:   Nutrition Goals Re-Evaluation:   Nutrition Goals Discharge (Final Nutrition Goals Re-Evaluation):   Psychosocial: Target Goals: Acknowledge presence or absence of significant depression and/or stress, maximize coping skills, provide positive support system. Participant is able to verbalize types and ability to use techniques and skills needed for reducing stress and depression.  Initial Review & Psychosocial Screening: Initial Psych Review & Screening - 01/22/19 1633      Initial Review   Current issues with  None Identified      Family Dynamics   Good Support System?  Yes   Amirah has a brother that lives near by for support and a  grandaughter     Barriers   Psychosocial barriers to participate in program  There are no identifiable barriers or psychosocial needs.      Screening Interventions   Interventions  Encouraged to exercise       Quality of Life Scores: Quality of Life - 01/22/19 1228      Quality of Life  Select  Quality of Life      Quality of Life Scores   Health/Function Pre  23.67 %    Socioeconomic Pre  27.5 %    Psych/Spiritual Pre  26.57 %    Family Pre  23.5 %    GLOBAL Pre  25.03 %      Scores of 19 and below usually indicate a poorer quality of life in these areas.  A difference of  2-3 points is a clinically meaningful difference.  A difference of 2-3 points in the total score of the Quality of Life Index has been associated with significant improvement in overall quality of life, self-image, physical symptoms, and general health in studies assessing change in quality of life.  PHQ-9: Recent Review Flowsheet Data    Depression screen [facility] 2/9 01/22/2019 09/15/2015   Decreased Interest 0 0   Down, Depressed, Hopeless 0 0   PHQ - 2 Score 0 0     Interpretation of Total Score  Total Score Depression Severity:  1-4 = Minimal depression, 5-9 = Mild depression, 10-14 = Moderate depression, 15-19 = Moderately severe depression, 20-27 = Severe depression   Psychosocial Evaluation and Intervention:   Psychosocial Re-Evaluation:   Psychosocial Discharge (Final Psychosocial Re-Evaluation):   Vocational Rehabilitation: Provide vocational rehab assistance to qualifying candidates.   Vocational Rehab Evaluation & Intervention: Vocational Rehab - 01/22/19 1635      Initial Vocational Rehab Evaluation & Intervention   Assessment shows need for Vocational Rehabilitation  No       Education: Education Goals: Education classes will be provided on a weekly basis, covering required topics. Participant will state understanding/return demonstration of topics presented.  Learning  Barriers/Preferences: Learning Barriers/Preferences - 01/22/19 1231      Learning Barriers/Preferences   Learning Barriers  Sight    Learning Preferences  --   Visual      Education Topics: Hypertension, Hypertension Reduction -Define heart disease and high blood pressure. Discus how high blood pressure affects the body and ways to reduce high blood pressure.   Exercise and Your Heart -Discuss why it is important to exercise, the FITT principles of exercise, normal and abnormal responses to exercise, and how to exercise safely.   Angina -Discuss definition of angina, causes of angina, treatment of angina, and how to decrease risk of having angina.   Cardiac Medications -Review what the following cardiac medications are used for, how they affect the body, and side effects that may occur when taking the medications.  Medications include Aspirin, Beta blockers, calcium channel blockers, ACE Inhibitors, angiotensin receptor blockers, diuretics, digoxin, and antihyperlipidemics.   Congestive Heart Failure -Discuss the definition of CHF, how to live with CHF, the signs and symptoms of CHF, and how keep track of weight and sodium intake.   Heart Disease and Intimacy -Discus the effect sexual activity has on the heart, how changes occur during intimacy as we age, and safety during sexual activity.   Smoking Cessation / COPD -Discuss different methods to quit smoking, the health benefits of quitting smoking, and the definition of COPD.   Nutrition I: Fats -Discuss the types of cholesterol, what cholesterol does to the heart, and how cholesterol levels can be controlled.   Nutrition II: Labels -Discuss the different components of food labels and how to read food label   Heart Parts/Heart Disease and PAD -Discuss the anatomy of the heart, the pathway of blood circulation through the heart, and these are affected by heart disease.   Stress  I: Signs and Symptoms -Discuss the  causes of stress, how stress may lead to anxiety and depression, and ways to limit stress.   Stress II: Relaxation -Discuss different types of relaxation techniques to limit stress.   Warning Signs of Stroke / TIA -Discuss definition of a stroke, what the signs and symptoms are of a stroke, and how to identify when someone is having stroke.   Knowledge Questionnaire Score: Knowledge Questionnaire Score - 01/22/19 1229      Knowledge Questionnaire Score   Pre Score  22/24       Core Components/Risk Factors/Patient Goals at Admission: Personal Goals and Risk Factors at Admission - 01/22/19 1635      Core Components/Risk Factors/Patient Goals on Admission    Weight Management  Yes;Weight Maintenance    Intervention  Weight Management: Develop a combined nutrition and exercise program designed to reach desired caloric intake, while maintaining appropriate intake of nutrient and fiber, sodium and fats, and appropriate energy expenditure required for the weight goal.;Weight Management: Provide education and appropriate resources to help participant work on and attain dietary goals.    Diabetes  Yes    Intervention  Provide education about signs/symptoms and action to take for hypo/hyperglycemia.;Provide education about proper nutrition, including hydration, and aerobic/resistive exercise prescription along with prescribed medications to achieve blood glucose in normal ranges: Fasting glucose 65-99 mg/dL    Expected Outcomes  Short Term: Participant verbalizes understanding of the signs/symptoms and immediate care of hyper/hypoglycemia, proper foot care and importance of medication, aerobic/resistive exercise and nutrition plan for blood glucose control.;Long Term: Attainment of HbA1C < 7%.    Hypertension  Yes    Intervention  Provide education on lifestyle modifcations including regular physical activity/exercise, weight management, moderate sodium restriction and increased consumption of  fresh fruit, vegetables, and low fat dairy, alcohol moderation, and smoking cessation.;Monitor prescription use compliance.    Expected Outcomes  Short Term: Continued assessment and intervention until BP is < 140/70mm HG in hypertensive participants. < 130/8mm HG in hypertensive participants with diabetes, heart failure or chronic kidney disease.;Long Term: Maintenance of blood pressure at goal levels.    Lipids  Yes    Intervention  Provide education and support for participant on nutrition & aerobic/resistive exercise along with prescribed medications to achieve LDL 70mg , HDL >40mg .    Expected Outcomes  Short Term: Participant states understanding of desired cholesterol values and is compliant with medications prescribed. Participant is following exercise prescription and nutrition guidelines.;Long Term: Cholesterol controlled with medications as prescribed, with individualized exercise RX and with personalized nutrition plan. Value goals: LDL < 70mg , HDL > 40 mg.       Core Components/Risk Factors/Patient Goals Review:    Core Components/Risk Factors/Patient Goals at Discharge (Final Review):    ITP Comments: ITP Comments    Row Name 01/22/19 0857           ITP Comments  Medical Director- Dr. Fransico Him, MD          Comments: Oneal attended orientation on 01/23/2019 to review rules and guidelines for program.  Completed 6 minute walk test, Intitial ITP, and exercise prescription.  VSS. Telemetry-Sinus Rhythm.  Asymptomatic. Safety measures and social distancing in place per CDC guidelines. Lucelia has downloaded the virtual Cardiac rehab better heart APP and has scheduled an appointment to meet with the RD.Barnet Pall, RN,BSN 01/23/2019 8:49 AM

## 2019-01-24 ENCOUNTER — Inpatient Hospital Stay (HOSPITAL_COMMUNITY): Admission: RE | Admit: 2019-01-24 | Payer: Medicare PPO | Source: Ambulatory Visit

## 2019-01-28 ENCOUNTER — Ambulatory Visit (HOSPITAL_COMMUNITY): Payer: Medicare Other

## 2019-01-29 NOTE — Progress Notes (Deleted)
PATIENT: [name] DOB: 13-Feb-1940  REASON FOR VISIT: follow up HISTORY FROM: patient  HISTORY OF PRESENT ILLNESS: Today 01/29/19  [name] is an 79 year old female with history of cerebrovascular disease.  She was admitted to the hospital in August 2020 with a sudden vision change.  MRI evaluation confirmed an acute right posterior cerebral artery distribution stroke.  She remains on Plavix.  She has been found to have aortic valve stenosis.  She was sent for gait training.  Prior to this, she was last seen in office in 2015.  HISTORY 09/18/2018 Dr. Jannifer Franklin: [name] is a 80 year old right-handed white female with a history of cerebrovascular disease, she was seen through this office in 2015 for a right middle cerebral artery distribution stroke.  The patient had a right middle cerebral artery stent placement with subsequent occlusion.  She has done relatively well until just recently.  She was admitted to the hospital on 09 September 2018 with a sudden vision change.  MRI evaluation confirmed an acute right posterior cerebral artery distribution stroke.  The patient has a dense left homonymous visual field deficit as a consequence of this of stroke, she also has some sensation of imbalance with walking.  She presented with a headache but the headache has gone away at this point.  She reports no numbness or weakness of the extremities.  She denies any issues with speech or swallowing, she denies any change in memory.  She has no problems controlling the bowels or the bladder.  She was placed on a combination of aspirin and Plavix to switch to Plavix in 3 weeks.  A cardiac monitor study was placed.  The 2D echocardiogram confirmed the presence of severe aortic stenosis, the patient has a prominent cardiac murmur.  The patient will be followed through cardiology.  REVIEW OF SYSTEMS: Out of a complete 14 system review of symptoms, the patient complains only of the following symptoms, and all other  reviewed systems are negative.  ALLERGIES: No Known Allergies  HOME MEDICATIONS: Outpatient Medications Prior to Visit  Medication Sig Dispense Refill  . acetaminophen (TYLENOL) 325 MG tablet Take 650 mg by mouth every 6 (six) hours as needed for moderate pain or headache.     . allopurinol (ZYLOPRIM) 100 MG tablet Take 50 mg by mouth daily.    Marland Kitchen amLODipine (NORVASC) 10 MG tablet Take 1 tablet by mouth every day (Patient taking differently: Take 10 mg by mouth daily. ) 30 tablet 11  . aspirin EC 81 MG tablet Take 81 mg by mouth daily.    . B-D INS SYR ULTRAFINE 1CC/30G 30G X 1/2" 1 ML MISC Inject 1 each into the skin 3 (three) times daily between meals.    . calcitRIOL (ROCALTROL) 0.25 MCG capsule Take 0.25 mcg by mouth daily.     . clopidogrel (PLAVIX) 75 MG tablet Take 1 tablet (75 mg total) by mouth daily. 90 tablet 1  . folic acid (FOLVITE) 1 MG tablet Take 1 mg by mouth daily.    Marland Kitchen HUMALOG KWIKPEN 100 UNIT/ML KwikPen Inject 2-8 Units into the skin 3 (three) times daily before meals.     . hydrALAZINE (APRESOLINE) 25 MG tablet Take 1 tablet (25 mg total) by mouth 3 (three) times daily. 90 tablet 2  . insulin glargine (LANTUS) 100 UNIT/ML injection Inject 16 Units into the skin at bedtime.     Marland Kitchen labetalol (NORMODYNE) 100 MG tablet Take 100 mg by mouth 2 (two) times daily.    Marland Kitchen  leflunomide (ARAVA) 10 MG tablet Take 10 mg by mouth daily.    . niacin (NIASPAN) 500 MG CR tablet Take 500 mg by mouth daily.     . nitroGLYCERIN (NITROSTAT) 0.4 MG SL tablet PLACE 1 TABLET (0.4 MG TOTAL) UNDER THE TONGUE EVERY 5 MINUTES X 3 DOSES AS NEEDED FOR CHEST PAIN. 25 tablet 1  . nortriptyline (PAMELOR) 10 MG capsule Take 10 mg by mouth daily.    . ONE TOUCH ULTRA TEST test strip 1 each by Other route 3 (three) times daily between meals.    . polyvinyl alcohol (TEARS AGAIN) 1.4 % ophthalmic solution Place 1 drop into both eyes 3 (three) times daily as needed for dry eyes.    Marland Kitchen PRISTIQ 100 MG 24 hr tablet  Take 100 mg by mouth daily.     . rosuvastatin (CRESTOR) 20 MG tablet Take 20 mg by mouth daily.    Marland Kitchen torsemide (DEMADEX) 20 MG tablet Take 1 tablet (20 mg total) by mouth daily. May take additional dose in the afternoon for swelling, shortness of breath or weight gain. 180 tablet 0   No facility-administered medications prior to visit.    PAST MEDICAL HISTORY: Past Medical History:  Diagnosis Date  . Arthritis   . Cerebrovascular disease   . CKD (chronic kidney disease)    Sees Dr Hollie Salk  . Coronary artery disease   . Depression   . Diabetes ([place])    INSULIN DEPENDENT  . Diabetic peripheral neuropathy ([place])   . Diabetic retinopathy ([place])   . Diverticulitis   . GERD (gastroesophageal reflux disease)   . History of CVA (cerebrovascular accident)    x 2 no residulal  . Hyperlipidemia   . Hypertension   . Obesity   . Renal lesion   . S/P TAVR (transcatheter aortic valve replacement) 11/13/2018   s/p TAVR with a 34mm Edwards S3U via the TF approach by Dr. [name] and Dr. Angelena Form  . Severe aortic stenosis     PAST SURGICAL HISTORY: Past Surgical History:  Procedure Laterality Date  . ABDOMINAL HYSTERECTOMY    . AV FISTULA PLACEMENT Left 12/18/2017   Procedure: ARTERIOVENOUS (AV) FISTULA CREATION ARM;  Surgeon: Marty Heck, MD;  Location: [facility];  Service: Vascular;  Laterality: Left;  . bilateral cataract surgery    . CARDIAC CATHETERIZATION  01/07/2014   DR Alger RESECTION  02/1999  . COLONOSCOPY    . COLOSTOMY  02/1999  . COLOSTOMY CLOSURE  07/1999  . EYE SURGERY Left 2019  . FISTULA SUPERFICIALIZATION Left 06/29/2018   Procedure: FISTULA SUPERFICIALIZATION LEFT BRACHIOCEPHALIC;  Surgeon: Marty Heck, MD;  Location: [facility];  Service: Vascular;  Laterality: Left;  . LEFT HEART CATHETERIZATION WITH CORONARY ANGIOGRAM N/A 01/07/2014   Procedure: LEFT HEART CATHETERIZATION WITH CORONARY ANGIOGRAM;  Surgeon: Laverda Page, MD;  Location: [facility] CATH LAB;   Service: Cardiovascular;  Laterality: N/A;  . middle cerebral artery stent placement Right   . OTHER SURGICAL HISTORY     laser surgery  . PTCA  01/07/2014   DES to RCA    DR Einar Gip  . right knee surgery Right    for infection  . RIGHT/LEFT HEART CATH AND CORONARY ANGIOGRAPHY N/A 10/23/2018   Procedure: RIGHT/LEFT HEART CATH AND CORONARY ANGIOGRAPHY;  Surgeon: Nigel Mormon, MD;  Location: [facility] LAB;  Service: Cardiovascular;  Laterality: N/A;  . TEE WITHOUT CARDIOVERSION N/A 11/13/2018   Procedure: TRANSESOPHAGEAL ECHOCARDIOGRAM (TEE);  Surgeon: Angelena Form,  Annita Brod, MD;  Location: [facility];  Service: Open Heart Surgery;  Laterality: N/A;  . TRANSCATHETER AORTIC VALVE REPLACEMENT, TRANSFEMORAL  11/13/2018  . TRANSCATHETER AORTIC VALVE REPLACEMENT, TRANSFEMORAL N/A 11/13/2018   Procedure: TRANSCATHETER AORTIC VALVE REPLACEMENT, TRANSFEMORAL;  Surgeon: Burnell Blanks, MD;  Location: [place] [facility];  Service: Open Heart Surgery;  Laterality: N/A;    FAMILY HISTORY: Family History  Problem Relation Age of Onset  . Diabetes Mother   . Heart disease Mother   . Prostate cancer Father   . Hypertension Brother   . Prostate cancer Brother     SOCIAL HISTORY: Social History   Socioeconomic History  . Marital status: Widowed    Spouse name: Not on file  . Number of children: 2  . Years of education: PHD  . Highest education level: Not on file  Occupational History  . Occupation: Retired PhD Pharmacist, hospital [employer]  Tobacco Use  . Smoking status: Never Smoker  . Smokeless tobacco: Never Used  Substance and Sexual Activity  . Alcohol use: No  . Drug use: No  . Sexual activity: Not on file  Other Topics Concern  . Not on file  Social History Narrative   Patient is widowed and lives with her two adopted children.   Patient is retired.   Patient has two children of her own and two adopted children.   Patient has a PHD.   Patient is right-handed.   Social Determinants of  Health   Financial Resource Strain:   . Difficulty of Paying Living Expenses: Not on file  Food Insecurity:   . Worried About Charity fundraiser in the Last Year: Not on file  . Ran Out of Food in the Last Year: Not on file  Transportation Needs:   . Lack of Transportation (Medical): Not on file  . Lack of Transportation (Non-Medical): Not on file  Physical Activity:   . Days of Exercise per Week: Not on file  . Minutes of Exercise per Session: Not on file  Stress:   . Feeling of Stress : Not on file  Social Connections:   . Frequency of Communication with Friends and Family: Not on file  . Frequency of Social Gatherings with Friends and Family: Not on file  . Attends Religious Services: Not on file  . Active Member of Clubs or Organizations: Not on file  . Attends Archivist Meetings: Not on file  . Marital Status: Not on file  Intimate Partner Violence:   . Fear of Current or Ex-Partner: Not on file  . Emotionally Abused: Not on file  . Physically Abused: Not on file  . Sexually Abused: Not on file      PHYSICAL EXAM  There were no vitals filed for this visit. There is no height or weight on file to calculate BMI.  Generalized: Well developed, in no acute distress   Neurological examination  Mentation: Alert oriented to time, place, history taking. Follows all commands speech and language fluent Cranial nerve II-XII: Pupils were equal round reactive to light. Extraocular movements were full, visual field were full on confrontational test. Facial sensation and strength were normal. Uvula tongue midline. Head turning and shoulder shrug  were normal and symmetric. Motor: The motor testing reveals 5 over 5 strength of all 4 extremities. Good symmetric motor tone is noted throughout.  Sensory: Sensory testing is intact to soft touch on all 4 extremities. No evidence of extinction is noted.  Coordination: Cerebellar testing reveals good Albergo-nose-Rayfield  and  heel-to-shin bilaterally.  Gait and station: Gait is normal. Tandem gait is normal. Romberg is negative. No drift is seen.  Reflexes: Deep tendon reflexes are symmetric and normal bilaterally.   DIAGNOSTIC DATA (LABS, IMAGING, TESTING) - I reviewed patient records, labs, notes, testing and imaging myself where available.  Lab Results  Component Value Date   WBC 10.4 11/14/2018   HGB 9.9 (L) 11/14/2018   HCT 30.9 (L) 11/14/2018   MCV 93.4 11/14/2018   PLT 211 11/14/2018      Component Value Date/Time   NA 138 11/21/2018 1408   K 4.5 11/21/2018 1408   CL 98 11/21/2018 1408   CO2 27 11/21/2018 1408   GLUCOSE 101 (H) 11/21/2018 1408   GLUCOSE 92 11/14/2018 0401   BUN 39 (H) 11/21/2018 1408   CREATININE 2.51 (H) 11/21/2018 1408   CALCIUM 9.6 11/21/2018 1408   PROT 6.3 (L) 11/09/2018 1045   ALBUMIN 3.1 (L) 11/09/2018 1045   AST 20 11/09/2018 1045   ALT 12 11/09/2018 1045   ALKPHOS 61 11/09/2018 1045   BILITOT 0.3 11/09/2018 1045   GFRNONAA 18 (L) 11/21/2018 1408   GFRAA 20 (L) 11/21/2018 1408   Lab Results  Component Value Date   CHOL 147 09/10/2018   HDL 76 09/10/2018   LDLCALC 58 09/10/2018   TRIG 64 09/10/2018   CHOLHDL 1.9 09/10/2018   Lab Results  Component Value Date   HGBA1C 7.6 (H) 11/09/2018   No results found for: VITAMINB12 No results found for: TSH    ASSESSMENT AND PLAN 79 y.o. year old female  has a past medical history of Arthritis, Cerebrovascular disease, CKD (chronic kidney disease), Coronary artery disease, Depression, Diabetes ([place]), Diabetic peripheral neuropathy ([place]), Diabetic retinopathy ([place]), Diverticulitis, GERD (gastroesophageal reflux disease), History of CVA (cerebrovascular accident), Hyperlipidemia, Hypertension, Obesity, Renal lesion, S/P TAVR (transcatheter aortic valve replacement) (11/13/2018), and Severe aortic stenosis. here with:  1.  Right posterior cerebral artery distribution stroke, dense left homonymous visual field deficit,  August 2020 2.  Gait instability   I spent 15 minutes with the patient. 50% of this time was spent   Butler Denmark, [place], DNP 01/29/2019, 1:38 PM [facility] [address], [place], [place] [address] [phone]

## 2019-01-30 ENCOUNTER — Ambulatory Visit: Admitting: Neurology

## 2019-01-30 ENCOUNTER — Other Ambulatory Visit: Payer: Self-pay

## 2019-01-30 ENCOUNTER — Telehealth: Payer: Self-pay

## 2019-01-30 NOTE — Telephone Encounter (Signed)
Patient was a no call/no show for their appointment today.   

## 2019-01-31 ENCOUNTER — Encounter: Payer: Self-pay | Admitting: Neurology

## 2019-02-01 ENCOUNTER — Ambulatory Visit (HOSPITAL_COMMUNITY): Payer: Medicare Other

## 2019-02-04 ENCOUNTER — Ambulatory Visit (HOSPITAL_COMMUNITY): Payer: Medicare Other

## 2019-02-06 ENCOUNTER — Ambulatory Visit (HOSPITAL_COMMUNITY): Payer: Medicare Other

## 2019-02-08 ENCOUNTER — Ambulatory Visit (HOSPITAL_COMMUNITY): Payer: Medicare Other

## 2019-02-08 ENCOUNTER — Inpatient Hospital Stay (HOSPITAL_COMMUNITY)
Admission: RE | Admit: 2019-02-08 | Discharge: 2019-02-08 | Disposition: A | Discharge status: Home / Self Care | Payer: Medicare PPO | Source: Ambulatory Visit

## 2019-02-08 ENCOUNTER — Other Ambulatory Visit: Payer: Self-pay

## 2019-02-08 NOTE — Progress Notes (Signed)
Nutrition Note  Successful telephone encounter to schedule virtual nutrition assessment with [name]. Pt verbalized understanding that it will be over the phone.   Michaele Offer, MS, RDN, LDN

## 2019-02-11 ENCOUNTER — Ambulatory Visit (HOSPITAL_COMMUNITY): Payer: Medicare Other

## 2019-02-11 ENCOUNTER — Encounter (HOSPITAL_COMMUNITY)
Admission: RE | Admit: 2019-02-11 | Discharge: 2019-02-11 | Disposition: A | Discharge status: Home / Self Care | Payer: Medicare PPO | Source: Ambulatory Visit | Attending: Cardiology | Admitting: Cardiology

## 2019-02-11 DIAGNOSIS — Z952 Presence of prosthetic heart valve: Secondary | ICD-10-CM | POA: Insufficient documentation

## 2019-02-11 NOTE — Progress Notes (Signed)
Virtual Visit: Cardiac Rehab  I contacted patient to inquire about logging her exercise in the [facility] app. Patient states that she's been walking some but has been feeling sick, nauseated, weak, and dizzy the past couple of weeks. I asked if patient has seen her PCP regarding symptoms, and she states she's talked with her PCP and was prescribed medication for the nausea, which has helped with that. Patient has an appointment next week with her PCP. I advised patient to hold exercise as long as she's having symptoms until she's been seen by her physician. I will follow-up with patient next week.  Sol Passer, [place], ACSM Juanito Doom 02/11/2019 [phone]

## 2019-02-13 ENCOUNTER — Ambulatory Visit (HOSPITAL_COMMUNITY): Payer: Medicare Other

## 2019-02-15 ENCOUNTER — Telehealth (HOSPITAL_COMMUNITY): Payer: Self-pay | Admitting: *Deleted

## 2019-02-15 ENCOUNTER — Other Ambulatory Visit: Payer: Self-pay

## 2019-02-15 ENCOUNTER — Ambulatory Visit (HOSPITAL_COMMUNITY): Payer: Medicare Other

## 2019-02-15 ENCOUNTER — Inpatient Hospital Stay (HOSPITAL_COMMUNITY)
Admission: RE | Admit: 2019-02-15 | Discharge: 2019-02-15 | Disposition: A | Discharge status: Home / Self Care | Payer: Medicare PPO | Source: Ambulatory Visit

## 2019-02-15 NOTE — Telephone Encounter (Signed)
Spoke with Mrs Kneeland regarding counseling.Permission granted to give the spiritual care office her phone number to set up an appointment in the near future.Barnet Pall, RN,BSN 02/15/2019 11:22 AM

## 2019-02-15 NOTE — Progress Notes (Signed)
[name] 79 y.o. female Nutrition Note  Diagnosis:  Past Medical History:  Diagnosis Date  . Arthritis   . Cerebrovascular disease   . CKD (chronic kidney disease)    Sees Dr Hollie Salk  . Coronary artery disease   . Depression   . Diabetes ([place])    INSULIN DEPENDENT  . Diabetic peripheral neuropathy ([place])   . Diabetic retinopathy ([place])   . Diverticulitis   . GERD (gastroesophageal reflux disease)   . History of CVA (cerebrovascular accident)    x 2 no residulal  . Hyperlipidemia   . Hypertension   . Obesity   . Renal lesion   . S/P TAVR (transcatheter aortic valve replacement) 11/13/2018   s/p TAVR with a 40mm Edwards S3U via the TF approach by Dr. [name] and Dr. Angelena Form  . Severe aortic stenosis      Medications reviewed.   Current Outpatient Medications:  .  acetaminophen (TYLENOL) 325 MG tablet, Take 650 mg by mouth every 6 (six) hours as needed for moderate pain or headache. , Disp: , Rfl:  .  allopurinol (ZYLOPRIM) 100 MG tablet, Take 50 mg by mouth daily., Disp: , Rfl:  .  amLODipine (NORVASC) 10 MG tablet, Take 1 tablet by mouth every day (Patient taking differently: Take 10 mg by mouth daily. ), Disp: 30 tablet, Rfl: 11 .  aspirin EC 81 MG tablet, Take 81 mg by mouth daily., Disp: , Rfl:  .  B-D INS SYR ULTRAFINE 1CC/30G 30G X 1/2" 1 ML MISC, Inject 1 each into the skin 3 (three) times daily between meals., Disp: , Rfl:  .  calcitRIOL (ROCALTROL) 0.25 MCG capsule, Take 0.25 mcg by mouth daily. , Disp: , Rfl:  .  clopidogrel (PLAVIX) 75 MG tablet, Take 1 tablet (75 mg total) by mouth daily., Disp: 90 tablet, Rfl: 1 .  folic acid (FOLVITE) 1 MG tablet, Take 1 mg by mouth daily., Disp: , Rfl:  .  HUMALOG KWIKPEN 100 UNIT/ML KwikPen, Inject 2-8 Units into the skin 3 (three) times daily before meals. , Disp: , Rfl:  .  hydrALAZINE (APRESOLINE) 25 MG tablet, Take 1 tablet (25 mg total) by mouth 3 (three) times daily., Disp: 90 tablet, Rfl: 2 .  insulin glargine (LANTUS)  100 UNIT/ML injection, Inject 16 Units into the skin at bedtime. , Disp: , Rfl:  .  labetalol (NORMODYNE) 100 MG tablet, Take 100 mg by mouth 2 (two) times daily., Disp: , Rfl:  .  leflunomide (ARAVA) 10 MG tablet, Take 10 mg by mouth daily., Disp: , Rfl:  .  niacin (NIASPAN) 500 MG CR tablet, Take 500 mg by mouth daily. , Disp: , Rfl:  .  nitroGLYCERIN (NITROSTAT) 0.4 MG SL tablet, PLACE 1 TABLET (0.4 MG TOTAL) UNDER THE TONGUE EVERY 5 MINUTES X 3 DOSES AS NEEDED FOR CHEST PAIN., Disp: 25 tablet, Rfl: 1 .  nortriptyline (PAMELOR) 10 MG capsule, Take 10 mg by mouth daily., Disp: , Rfl:  .  ONE TOUCH ULTRA TEST test strip, 1 each by Other route 3 (three) times daily between meals., Disp: , Rfl:  .  polyvinyl alcohol (TEARS AGAIN) 1.4 % ophthalmic solution, Place 1 drop into both eyes 3 (three) times daily as needed for dry eyes., Disp: , Rfl:  .  PRISTIQ 100 MG 24 hr tablet, Take 100 mg by mouth daily. , Disp: , Rfl:  .  rosuvastatin (CRESTOR) 20 MG tablet, Take 20 mg by mouth daily., Disp: , Rfl:  .  torsemide (DEMADEX) 20 MG tablet, Take 1 tablet (20 mg total) by mouth daily. May take additional dose in the afternoon for swelling, shortness of breath or weight gain., Disp: 180 tablet, Rfl: 0   Ht Readings from Last 1 Encounters:  01/23/19 5\' 3"  (1.6 m)     Wt Readings from Last 3 Encounters:  01/23/19 162 lb 4.8 oz (73.6 kg)  01/22/19 167 lb 5.3 oz (75.9 kg)  12/12/18 164 lb (74.4 kg)     There is no height or weight on file to calculate BMI.   Social History   Tobacco Use  Smoking Status Never Smoker  Smokeless Tobacco Never Used     Lab Results  Component Value Date   CHOL 147 09/10/2018   Lab Results  Component Value Date   HDL 76 09/10/2018   Lab Results  Component Value Date   LDLCALC 58 09/10/2018   Lab Results  Component Value Date   TRIG 64 09/10/2018   Lab Results  Component Value Date   CHOLHDL 1.9 09/10/2018     Lab Results  Component Value Date    HGBA1C 7.6 (H) 11/09/2018     CBG (last 3)  No results for input(s): GLUCAP in the last 72 hours.   Nutrition Note  Spoke with pt. Nutrition Plan and Nutrition Survey goals reviewed with pt. Pt is following a Heart Healthy diet.  [name] has been diagnosed with type 2 Diabetes since 1978. She takes insulin on a sliding scale. She tries very hard to keep her CBGs in range while also limiting potassium, sodium, and phosphorus for CKD stage 4. She is currently not on dialysis.  She thinks she is on a 32 oz fluid restriction which she believes she received years ago. She will ask at her next appointment in February.  [name] is experiencing poor appetite which she attributes to anniversary of loss of family members and grief. Recommended counseling as she used to see a Social worker and she said it helped. Her support system is her brother.  [name] is well educated about her diet and makes healthy choices daily to keep her kidneys, heart, and blood sugars in check. Provided nutrition supplement ideas via [email or web address] that are low in potassium, phos, and sodium.  Pt expressed understanding of the information reviewed. Will follow up next week.    Nutrition Diagnosis ? Food-and nutrition-related knowledge deficit related to lack of exposure to information as related to diagnosis of: ? CVD ? Type 2 Diabetes, CKD  Nutrition Intervention ? Pt's individual nutrition plan reviewed with pt. ? Benefits of adopting Heart Healthy diet discussed when [employer] reviewed.   ? Continue client-centered nutrition education by RD, as part of interdisciplinary care.  Goal(s)  ? Pt to build a healthy plate including vegetables, fruits, whole grains, and low-fat dairy products in a heart healthy meal plan. ? Pt to continue limiting sodium, phosphorus, and potassium rich foods ? CBG concentrations in the normal range or as close to normal as is safely possible.   Plan:   Will provide client-centered nutrition education as  part of interdisciplinary care  Monitor and evaluate progress toward nutrition goal with team.   Michaele Offer, MS, RDN, LDN

## 2019-02-18 ENCOUNTER — Ambulatory Visit (HOSPITAL_COMMUNITY): Payer: Medicare Other

## 2019-02-18 NOTE — Addendum Note (Signed)
Encounter addended by: Sol Passer on: 02/18/2019 1:30 PM  Actions taken: Flowsheet data copied forward, Flowsheet accepted

## 2019-02-18 NOTE — Addendum Note (Signed)
Encounter addended by: Sol Passer on: 02/18/2019 10:55 AM  Actions taken: Flowsheet data copied forward, Flowsheet accepted

## 2019-02-18 NOTE — Addendum Note (Signed)
Encounter addended by: Sol Passer on: 02/18/2019 10:49 AM  Actions taken: Flowsheet data copied forward, Flowsheet accepted

## 2019-02-20 ENCOUNTER — Ambulatory Visit (HOSPITAL_COMMUNITY): Payer: Medicare Other

## 2019-02-21 ENCOUNTER — Encounter (HOSPITAL_COMMUNITY): Payer: Medicare PPO

## 2019-02-22 ENCOUNTER — Ambulatory Visit (HOSPITAL_COMMUNITY): Payer: Medicare Other

## 2019-02-22 ENCOUNTER — Telehealth (HOSPITAL_COMMUNITY): Payer: Self-pay

## 2019-02-22 NOTE — Telephone Encounter (Signed)
Pt insurance is active and benefits verified through [place] $20, DED 0/0 met, out of pocket $4,000/$80 met, co-insurance 0. no pre-authorization required. Passport, 02/22/2019_0 :34am, GTX#[id number]  Will contact patient to see if she is interested in the Cardiac Rehab Program. If interested, patient will need to complete follow up appt. Once completed, patient will be contacted for scheduling upon review by the RN Navigator.  2ndary insurance is active and benefits verified through [employer]. Co-pay 0, DED 0/0 met, out of pocket 0/0 met, co-insurance 0. No pre-authorization required, REF# [id number]

## 2019-02-25 ENCOUNTER — Ambulatory Visit (HOSPITAL_COMMUNITY): Payer: Medicare Other

## 2019-02-25 ENCOUNTER — Encounter (HOSPITAL_COMMUNITY)
Admission: RE | Admit: 2019-02-25 | Discharge: 2019-02-25 | Disposition: A | Discharge status: Home / Self Care | Payer: Medicare PPO | Source: Ambulatory Visit | Attending: Cardiology | Admitting: Cardiology

## 2019-02-25 ENCOUNTER — Encounter (HOSPITAL_COMMUNITY): Payer: Self-pay

## 2019-02-25 DIAGNOSIS — Z952 Presence of prosthetic heart valve: Secondary | ICD-10-CM | POA: Insufficient documentation

## 2019-02-25 NOTE — Progress Notes (Signed)
Cardiac Rehab: Virtual Visit  Contacted to patient to follow-up on her progress with exercise at home. Patient had been experiencing dizziness and was holding exercise until speaking with her physician about her symptoms. Patient spoke with her cardiologist about her symptoms, and she has an appointment with her nephrologist on Wednesday. Patient is feeling better and hasn't had a spell of dizziness in over a week. Patient states she possibly hasn't been eating enough, and she has discussed this with [name] the dietitian.   Patient states that she has been walking daily in the house 5-10 minutes without any issues. Discussed increasing duration by adding 1 minutes each walk until she reaches 15 minutes, and patient is amenable to this. Patient is doing the stretching exercises and using her 5 lb. weights for the resistance training.   Patient has not been using the virtual cardiac rehab app to log her exercise and says she's not very literate with her smart phone. Patient would prefer to discharge from the virtual app and just receive a weekly follow-up call until she's able to be scheduled to exercise in the onsite cardiac rehab program. Will discharge patient from the [facility] app and will continue to follow-up via telephone calls.  Susan Passer, MS, ACSM Juanito Doom 02/25/2019 [id number]

## 2019-02-25 NOTE — Progress Notes (Signed)
Cardiac Individual Treatment Plan  Patient Details  Name: NYLE BLAKENSHIP MRN: [id number] Date of Birth: 08-19-1940 Referring Provider:     [place] from 01/22/2019 in [facility]  Referring Provider  [place]. Manish      Initial Encounter Date:    CARDIAC REHAB PHASE II ORIENTATION from 01/22/2019 in [facility]  Date  02/18/19      Visit Diagnosis: 11/13/18 TAVR  Patient's Home Medications on Admission:  Current Outpatient Medications:  .  acetaminophen (TYLENOL) 325 MG tablet, Take 650 mg by mouth every 6 (six) hours as needed for moderate pain or headache. , Disp: , Rfl:  .  allopurinol (ZYLOPRIM) 100 MG tablet, Take 50 mg by mouth daily., Disp: , Rfl:  .  amLODipine (NORVASC) 10 MG tablet, Take 1 tablet by mouth every day (Patient taking differently: Take 10 mg by mouth daily. ), Disp: 30 tablet, Rfl: 11 .  aspirin EC 81 MG tablet, Take 81 mg by mouth daily., Disp: , Rfl:  .  B-D INS SYR ULTRAFINE 1CC/30G 30G X 1/2" 1 ML MISC, Inject 1 each into the skin 3 (three) times daily between meals., Disp: , Rfl:  .  calcitRIOL (ROCALTROL) 0.25 MCG capsule, Take 0.25 mcg by mouth daily. , Disp: , Rfl:  .  clopidogrel (PLAVIX) 75 MG tablet, Take 1 tablet (75 mg total) by mouth daily., Disp: 90 tablet, Rfl: 1 .  folic acid (FOLVITE) 1 MG tablet, Take 1 mg by mouth daily., Disp: , Rfl:  .  HUMALOG KWIKPEN 100 UNIT/ML KwikPen, Inject 2-8 Units into the skin 3 (three) times daily before meals. , Disp: , Rfl:  .  hydrALAZINE (APRESOLINE) 25 MG tablet, Take 1 tablet (25 mg total) by mouth 3 (three) times daily., Disp: 90 tablet, Rfl: 2 .  insulin glargine (LANTUS) 100 UNIT/ML injection, Inject 16 Units into the skin at bedtime. , Disp: , Rfl:  .  labetalol (NORMODYNE) 100 MG tablet, Take 100 mg by mouth 2 (two) times daily., Disp: , Rfl:  .  leflunomide (ARAVA) 10 MG tablet, Take 10 mg by mouth daily., Disp: ,  Rfl:  .  niacin (NIASPAN) 500 MG CR tablet, Take 500 mg by mouth daily. , Disp: , Rfl:  .  nitroGLYCERIN (NITROSTAT) 0.4 MG SL tablet, PLACE 1 TABLET (0.4 MG TOTAL) UNDER THE TONGUE EVERY 5 MINUTES X 3 DOSES AS NEEDED FOR CHEST PAIN., Disp: 25 tablet, Rfl: 1 .  nortriptyline (PAMELOR) 10 MG capsule, Take 10 mg by mouth daily., Disp: , Rfl:  .  ONE TOUCH ULTRA TEST test strip, 1 each by Other route 3 (three) times daily between meals., Disp: , Rfl:  .  polyvinyl alcohol (TEARS AGAIN) 1.4 % ophthalmic solution, Place 1 drop into both eyes 3 (three) times daily as needed for dry eyes., Disp: , Rfl:  .  PRISTIQ 100 MG 24 hr tablet, Take 100 mg by mouth daily. , Disp: , Rfl:  .  rosuvastatin (CRESTOR) 20 MG tablet, Take 20 mg by mouth daily., Disp: , Rfl:  .  torsemide (DEMADEX) 20 MG tablet, Take 1 tablet (20 mg total) by mouth daily. May take additional dose in the afternoon for swelling, shortness of breath or weight gain., Disp: 180 tablet, Rfl: 0  Past Medical History: Past Medical History:  Diagnosis Date  . Arthritis   . Cerebrovascular disease   . CKD (chronic kidney disease)    Sees Dr  Upton  . Coronary artery disease   . Depression   . Diabetes ([place])    INSULIN DEPENDENT  . Diabetic peripheral neuropathy ([place])   . Diabetic retinopathy ([place])   . Diverticulitis   . GERD (gastroesophageal reflux disease)   . History of CVA (cerebrovascular accident)    x 2 no residulal  . Hyperlipidemia   . Hypertension   . Obesity   . Renal lesion   . S/P TAVR (transcatheter aortic valve replacement) 11/13/2018   s/p TAVR with a 66mm Edwards S3U via the TF approach by Dr. [name] and Dr. Angelena Form  . Severe aortic stenosis     Tobacco Use: Social History   Tobacco Use  Smoking Status Never Smoker  Smokeless Tobacco Never Used    Labs: Recent Review Flowsheet Data    Labs for ITP Cardiac and Pulmonary Rehab Latest Ref Rng & Units 10/23/2018 11/09/2018 11/13/2018 11/13/2018 11/13/2018    Cholestrol 0 - 200 mg/dL - - - - -   LDLCALC 0 - 99 mg/dL - - - - -   HDL >40 mg/dL - - - - -   Trlycerides <150 mg/dL - - - - -   Hemoglobin A1c 4.8 - 5.6 % - 7.6(H) - - -   PHART 7.350 - 7.450 - - - - -   PCO2ART 32.0 - 48.0 mmHg - - - - -   HCO3 20.0 - 28.0 mmol/L 22.4 - - - -   TCO2 22 - 32 mmol/L 23 - 22 22 23    ACIDBASEDEF 0.0 - 2.0 mmol/L 2.0 - - - -   O2SAT % 64.0 - - - -      Capillary Blood Glucose: Lab Results  Component Value Date   GLUCAP 113 (H) 11/14/2018   GLUCAP 90 11/14/2018   GLUCAP 130 (H) 11/13/2018   GLUCAP 233 (H) 11/13/2018   GLUCAP 135 (H) 11/13/2018     Exercise Target Goals: Exercise Program Goal: Individual exercise prescription set using results from initial 6 min walk test and THRR while considering  patient's activity barriers and safety.   Exercise Prescription Goal: Initial exercise prescription builds to 30-45 minutes a day of aerobic activity, 2-3 days per week.  Home exercise guidelines will be given to patient during program as part of exercise prescription that the participant will acknowledge.  Activity Barriers & Risk Stratification: Activity Barriers & Cardiac Risk Stratification - 01/22/19 0926      Activity Barriers & Cardiac Risk Stratification   Activity Barriers  Other (comment)    Comments  Some left sided weakness from [place]oke in August 2020.    Cardiac Risk Stratification  High       6 Minute Walk: 6 Minute Walk    Row Name 01/22/19 0936         6 Minute Walk   Phase  Initial     Distance  1400 feet     Walk Time  6 minutes     # of Rest Breaks  0     MPH  2.65     METS  2.24     RPE  11     Perceived Dyspnea   0     VO2 Peak  7.83     Symptoms  No     Resting HR  61 bpm     Resting BP  118/64     Resting Oxygen Saturation   100 %     Exercise Oxygen Saturation  during  6 min walk  100 %     Max Ex. HR  79 bpm     Max Ex. BP  148/42     2 Minute Post BP  154/52        Oxygen Initial  Assessment:   Oxygen Re-Evaluation:   Oxygen Discharge (Final Oxygen Re-Evaluation):   Initial Exercise Prescription: Initial Exercise Prescription - 02/18/19 1000      Date of Initial Exercise RX and Referring Provider   Date  02/18/19    Referring Provider  Patwardhan. Manish      NuStep   Level  2    SPM  85    Minutes  15    METs  2.2      Arm Ergometer   Level  2    Watts  20    Minutes  15    METs  2.4      Track   Minutes  --      Prescription Details   Frequency (times per week)  3    Duration  Progress to 30 minutes of continuous aerobic without signs/symptoms of physical distress      Intensity   THRR 40-80% of Max Heartrate  56-112    Ratings of Perceived Exertion  11-13    Perceived Dyspnea  0-4      Progression   Progression  Continue to progress workloads to maintain intensity without signs/symptoms of physical distress.      Resistance Training   Training Prescription  Yes    Weight  2lbs    Reps  10-15       Perform Capillary Blood Glucose checks as needed.  Exercise Prescription Changes:  Exercise Prescription Changes    Row Name 02/18/19 1000             Home Exercise Plan   Plans to continue exercise at  Home (comment) Walking       Initial Home Exercises Provided  01/22/19          Exercise Comments:  Exercise Comments    Row Name 01/22/19 0926 02/18/19 1050 02/25/19 1046       Exercise Comments  Reviewed home exericse plan with patient.  Updated exercise prescription for onsite cardiac rehab program.  Patient will be contacted to begin participation in the onsite cardiac rehab program after temporary closure due to COVID-19 pandemic.        Exercise Goals and Review:  Exercise Goals    Row Name 01/22/19 0926             Exercise Goals   Increase Physical Activity  Yes       Intervention  Provide advice, education, support and counseling about physical activity/exercise needs.;Develop an individualized  exercise prescription for aerobic and resistive training based on initial evaluation findings, risk stratification, comorbidities and participant's personal goals.       Expected Outcomes  Short Term: Attend rehab on a regular basis to increase amount of physical activity.;Long Term: Exercising regularly at least 3-5 days a week.;Long Term: Add in home exercise to make exercise part of routine and to increase amount of physical activity.       Increase Strength and Stamina  Yes       Intervention  Provide advice, education, support and counseling about physical activity/exercise needs.;Develop an individualized exercise prescription for aerobic and resistive training based on initial evaluation findings, risk stratification, comorbidities and participant's personal goals.       Expected  Outcomes  Short Term: Increase workloads from initial exercise prescription for resistance, speed, and METs.;Short Term: Perform resistance training exercises routinely during rehab and add in resistance training at home;Long Term: Improve cardiorespiratory fitness, muscular endurance and strength as measured by increased METs and functional capacity (6MWT)       Able to understand and use rate of perceived exertion (RPE) scale  Yes       Intervention  Provide education and explanation on how to use RPE scale       Expected Outcomes  Short Term: Able to use RPE daily in rehab to express subjective intensity level;Long Term:  Able to use RPE to guide intensity level when exercising independently       Knowledge and understanding of Target Heart Rate Range (THRR)  Yes       Intervention  Provide education and explanation of THRR including how the numbers were predicted and where they are located for reference       Expected Outcomes  Short Term: Able to state/look up THRR;Long Term: Able to use THRR to govern intensity when exercising independently;Short Term: Able to use daily as guideline for intensity in rehab       Able  to check pulse independently  Yes       Intervention  Provide education and demonstration on how to check pulse in carotid and radial arteries.;Review the importance of being able to check your own pulse for safety during independent exercise       Expected Outcomes  Short Term: Able to explain why pulse checking is important during independent exercise;Long Term: Able to check pulse independently and accurately       Understanding of Exercise Prescription  Yes       Intervention  Provide education, explanation, and written materials on patient's individual exercise prescription       Expected Outcomes  Short Term: Able to explain program exercise prescription;Long Term: Able to explain home exercise prescription to exercise independently          Exercise Goals Re-Evaluation : Exercise Goals Re-Evaluation    Row Name 02/25/19 1043             Exercise Goal Re-Evaluation   Comments  Patient is not active in the virtual cardaic rehab ap. Will continue to check exercise progress via weekly phone calls until patient is schedule to begin the onsite cardiac rehab program. Patient is walking 5-10 minutes in house daily, stretching and using 5 lb. hand werights. Patient will increase duration by 1 minute each session until reaching 15 minutes daily.       Expected Outcomes  Patient will transition to exercise in the onsite cardiac rehab program.          Discharge Exercise Prescription (Final Exercise Prescription Changes): Exercise Prescription Changes - 02/18/19 1000      Home Exercise Plan   Plans to continue exercise at  Home (comment)   Walking   Initial Home Exercises Provided  01/22/19       Nutrition:  Target Goals: Understanding of nutrition guidelines, daily intake of sodium 1500mg , cholesterol 200mg , calories 30% from fat and 7% or less from saturated fats, daily to have 5 or more servings of fruits and vegetables.  Biometrics: Pre Biometrics - 01/22/19 0926      Pre  Biometrics   Height  5\' 3"  (1.6 m)    Weight  167 lb 5.3 oz (75.9 kg)    Waist Circumference  37.25 inches  Hip Circumference  42.25 inches    Waist to Hip Ratio  0.88 %    BMI (Calculated)  29.65    Triceps Skinfold  36 mm    % Body Fat  43.2 %    Grip Strength  27.5 kg    Flexibility  0 in    Single Leg Stand  3.12 seconds        Nutrition Therapy Plan and Nutrition Goals:   Nutrition Assessments:   Nutrition Goals Re-Evaluation:   Nutrition Goals Re-Evaluation:   Nutrition Goals Discharge (Final Nutrition Goals Re-Evaluation):   Psychosocial: Target Goals: Acknowledge presence or absence of significant depression and/or stress, maximize coping skills, provide positive support system. Participant is able to verbalize types and ability to use techniques and skills needed for reducing stress and depression.  Initial Review & Psychosocial Screening: Initial Psych Review & Screening - 01/22/19 1633      Initial Review   Current issues with  None Identified      Family Dynamics   Good Support System?  Yes   Legna has a brother that lives near by for support and a grandaughter     Barriers   Psychosocial barriers to participate in program  There are no identifiable barriers or psychosocial needs.      Screening Interventions   Interventions  Encouraged to exercise       Quality of Life Scores: Quality of Life - 01/22/19 1228      Quality of Life   Select  Quality of Life      Quality of Life Scores   Health/Function Pre  23.67 %    Socioeconomic Pre  27.5 %    Psych/Spiritual Pre  26.57 %    Family Pre  23.5 %    GLOBAL Pre  25.03 %      Scores of 19 and below usually indicate a poorer quality of life in these areas.  A difference of  2-3 points is a clinically meaningful difference.  A difference of 2-3 points in the total score of the Quality of Life Index has been associated with significant improvement in overall quality of life, self-image, physical  symptoms, and general health in studies assessing change in quality of life.  PHQ-9: Recent Review Flowsheet Data    Depression screen [facility] 2/9 01/22/2019 09/15/2015   Decreased Interest 0 0   Down, Depressed, Hopeless 0 0   PHQ - 2 Score 0 0     Interpretation of Total Score  Total Score Depression Severity:  1-4 = Minimal depression, 5-9 = Mild depression, 10-14 = Moderate depression, 15-19 = Moderately severe depression, 20-27 = Severe depression   Psychosocial Evaluation and Intervention: Psychosocial Evaluation - 02/25/19 1107      Psychosocial Evaluation & Interventions   Interventions  Therapist referral;Encouraged to exercise with the program and follow exercise prescription    Comments  Patient recently expressed symptoms of depression. She was referred to and contacted by hospital staff counselor.    Expected Outcomes  Patient will continue to work with counselor and have open, ongoing conversations with [facility] staff about her depression symptoms    Continue Psychosocial Services   Follow up required by staff       Psychosocial Re-Evaluation:   Psychosocial Discharge (Final Psychosocial Re-Evaluation):   Vocational Rehabilitation: Provide vocational rehab assistance to qualifying candidates.   Vocational Rehab Evaluation & Intervention: Vocational Rehab - 01/22/19 1635      Initial Vocational Rehab Evaluation & Intervention  Assessment shows need for Vocational Rehabilitation  No       Education: Education Goals: Education classes will be provided on a weekly basis, covering required topics. Participant will state understanding/return demonstration of topics presented.  Learning Barriers/Preferences: Learning Barriers/Preferences - 01/22/19 1231      Learning Barriers/Preferences   Learning Barriers  Sight    Learning Preferences  --   Visual      Education Topics: Count Your Pulse:  -Group instruction provided by verbal instruction, demonstration, patient  participation and written materials to support subject.  Instructors address importance of being able to find your pulse and how to count your pulse when at home without a heart monitor.  Patients get hands on experience counting their pulse with staff help and individually.   Heart Attack, Angina, and Risk Factor Modification:  -Group instruction provided by verbal instruction, video, and written materials to support subject.  Instructors address signs and symptoms of angina and heart attacks.    Also discuss risk factors for heart disease and how to make changes to improve heart health risk factors.   Functional Fitness:  -Group instruction provided by verbal instruction, demonstration, patient participation, and written materials to support subject.  Instructors address safety measures for doing things around the house.  Discuss how to get up and down off the floor, how to pick things up properly, how to safely get out of a chair without assistance, and balance training.   Meditation and Mindfulness:  -Group instruction provided by verbal instruction, patient participation, and written materials to support subject.  Instructor addresses importance of mindfulness and meditation practice to help reduce stress and improve awareness.  Instructor also leads participants through a meditation exercise.    Stretching for Flexibility and Mobility:  -Group instruction provided by verbal instruction, patient participation, and written materials to support subject.  Instructors lead participants through series of stretches that are designed to increase flexibility thus improving mobility.  These stretches are additional exercise for major muscle groups that are typically performed during regular warm up and cool down.   Hands Only CPR:  -Group verbal, video, and participation provides a basic overview of AHA guidelines for community CPR. Role-play of emergencies allow participants the opportunity to  practice calling for help and chest compression technique with discussion of AED use.   Hypertension: -Group verbal and written instruction that provides a basic overview of hypertension including the most recent diagnostic guidelines, risk factor reduction with self-care instructions and medication management.    Nutrition I class: Heart Healthy Eating:  -Group instruction provided by PowerPoint slides, verbal discussion, and written materials to support subject matter. The instructor gives an explanation and review of the Therapeutic Lifestyle Changes diet recommendations, which includes a discussion on lipid goals, dietary fat, sodium, fiber, plant stanol/sterol esters, sugar, and the components of a well-balanced, healthy diet.   Nutrition II class: Lifestyle Skills:  -Group instruction provided by PowerPoint slides, verbal discussion, and written materials to support subject matter. The instructor gives an explanation and review of label reading, grocery shopping for heart health, heart healthy recipe modifications, and ways to make healthier choices when eating out.   Diabetes Question & Answer:  -Group instruction provided by PowerPoint slides, verbal discussion, and written materials to support subject matter. The instructor gives an explanation and review of diabetes co-morbidities, pre- and post-prandial blood glucose goals, pre-exercise blood glucose goals, signs, symptoms, and treatment of hypoglycemia and hyperglycemia, and foot care basics.   Diabetes Blitz:  -  Group instruction provided by PowerPoint slides, verbal discussion, and written materials to support subject matter. The instructor gives an explanation and review of the physiology behind type 1 and type 2 diabetes, diabetes medications and rational behind using different medications, pre- and post-prandial blood glucose recommendations and Hemoglobin A1c goals, diabetes diet, and exercise including blood glucose guidelines  for exercising safely.    Portion Distortion:  -Group instruction provided by PowerPoint slides, verbal discussion, written materials, and food models to support subject matter. The instructor gives an explanation of serving size versus portion size, changes in portions sizes over the last 20 years, and what consists of a serving from each food group.   Stress Management:  -Group instruction provided by verbal instruction, video, and written materials to support subject matter.  Instructors review role of stress in heart disease and how to cope with stress positively.     Exercising on Your Own:  -Group instruction provided by verbal instruction, power point, and written materials to support subject.  Instructors discuss benefits of exercise, components of exercise, frequency and intensity of exercise, and end points for exercise.  Also discuss use of nitroglycerin and activating EMS.  Review options of places to exercise outside of rehab.  Review guidelines for sex with heart disease.   Cardiac Drugs I:  -Group instruction provided by verbal instruction and written materials to support subject.  Instructor reviews cardiac drug classes: antiplatelets, anticoagulants, beta blockers, and statins.  Instructor discusses reasons, side effects, and lifestyle considerations for each drug class.   Cardiac Drugs II:  -Group instruction provided by verbal instruction and written materials to support subject.  Instructor reviews cardiac drug classes: angiotensin converting enzyme inhibitors (ACE-I), angiotensin II receptor blockers (ARBs), nitrates, and calcium channel blockers.  Instructor discusses reasons, side effects, and lifestyle considerations for each drug class.   Anatomy and Physiology of the Circulatory System:  Group verbal and written instruction and models provide basic cardiac anatomy and physiology, with the coronary electrical and arterial systems. Review of: AMI, Angina, Valve  disease, Heart Failure, Peripheral Artery Disease, Cardiac Arrhythmia, Pacemakers, and the ICD.   Other Education:  -Group or individual verbal, written, or video instructions that support the educational goals of the cardiac rehab program.   Holiday Eating Survival Tips:  -Group instruction provided by PowerPoint slides, verbal discussion, and written materials to support subject matter. The instructor gives patients tips, tricks, and techniques to help them not only survive but enjoy the holidays despite the onslaught of food that accompanies the holidays.   Knowledge Questionnaire Score: Knowledge Questionnaire Score - 01/22/19 1229      Knowledge Questionnaire Score   Pre Score  22/24       Core Components/Risk Factors/Patient Goals at Admission: Personal Goals and Risk Factors at Admission - 01/22/19 1635      Core Components/Risk Factors/Patient Goals on Admission    Weight Management  Yes;Weight Maintenance;Weight Loss    Intervention  Weight Management: Develop a combined nutrition and exercise program designed to reach desired caloric intake, while maintaining appropriate intake of nutrient and fiber, sodium and fats, and appropriate energy expenditure required for the weight goal.;Weight Management: Provide education and appropriate resources to help participant work on and attain dietary goals.;Weight Management/Obesity: Establish reasonable short term and long term weight goals.    Diabetes  Yes    Intervention  Provide education about signs/symptoms and action to take for hypo/hyperglycemia.;Provide education about proper nutrition, including hydration, and aerobic/resistive exercise prescription along with prescribed  medications to achieve blood glucose in normal ranges: Fasting glucose 65-99 mg/dL    Expected Outcomes  Short Term: Participant verbalizes understanding of the signs/symptoms and immediate care of hyper/hypoglycemia, proper foot care and importance of  medication, aerobic/resistive exercise and nutrition plan for blood glucose control.;Long Term: Attainment of HbA1C < 7%.    Hypertension  Yes    Intervention  Provide education on lifestyle modifcations including regular physical activity/exercise, weight management, moderate sodium restriction and increased consumption of fresh fruit, vegetables, and low fat dairy, alcohol moderation, and smoking cessation.;Monitor prescription use compliance.    Expected Outcomes  Short Term: Continued assessment and intervention until BP is < 140/51mm HG in hypertensive participants. < 130/51mm HG in hypertensive participants with diabetes, heart failure or chronic kidney disease.;Long Term: Maintenance of blood pressure at goal levels.    Lipids  Yes    Intervention  Provide education and support for participant on nutrition & aerobic/resistive exercise along with prescribed medications to achieve LDL 70mg , HDL >40mg .    Expected Outcomes  Short Term: Participant states understanding of desired cholesterol values and is compliant with medications prescribed. Participant is following exercise prescription and nutrition guidelines.;Long Term: Cholesterol controlled with medications as prescribed, with individualized exercise RX and with personalized nutrition plan. Value goals: LDL < 70mg , HDL > 40 mg.       Core Components/Risk Factors/Patient Goals Review:  Goals and Risk Factor Review    Row Name 02/25/19 1109             Core Components/Risk Factors/Patient Goals Review   Personal Goals Review  Weight Management/Obesity;Diabetes;Hypertension;Lipids       Review  Patient with multiple CAD risk factors.  Continues to participate in cardiac rehab plan of care at home independently. Exercise limited recently by feeling unwell.       Expected Outcomes  Patient will continue to participate in cardiac rehab for risk factor modification          Core Components/Risk Factors/Patient Goals at Discharge (Final  Review):  Goals and Risk Factor Review - 02/25/19 1109      Core Components/Risk Factors/Patient Goals Review   Personal Goals Review  Weight Management/Obesity;Diabetes;Hypertension;Lipids    Review  Patient with multiple CAD risk factors.  Continues to participate in cardiac rehab plan of care at home independently. Exercise limited recently by feeling unwell.    Expected Outcomes  Patient will continue to participate in cardiac rehab for risk factor modification       ITP Comments: ITP Comments    Row Name 01/22/19 0857 02/25/19 1105         ITP Comments  Medical Director- Dr. Fransico Him, MD  30 day ITP review: In-person Cardiac Rehab exercise session will resume. Patient will be contacted to see if interested. Currently patient is following their cardiac rehab plan of care independently at home. She recently has not been feeling well (see progress note) and instructed to continue all components of her care plan but limit her exercise until she is feeling better.         Comments: see ITP comments

## 2019-02-25 NOTE — Progress Notes (Signed)
Phone call to Pt to inquire about interest in coming to in person CR. Pt was interested and wanted to schedule her appointments. Pt stated she would like to start CR on Monday 03/04/2019. Pt appointments were scheduled.

## 2019-02-25 NOTE — Addendum Note (Signed)
Encounter addended by: Sol Passer on: 02/25/2019 10:46 AM  Actions taken: Flowsheet data copied forward, Flowsheet accepted

## 2019-02-25 NOTE — Addendum Note (Signed)
Encounter addended by: Sol Passer on: 02/25/2019 11:03 AM  Actions taken: Flowsheet accepted

## 2019-02-27 ENCOUNTER — Ambulatory Visit (HOSPITAL_COMMUNITY): Payer: Medicare Other

## 2019-03-01 ENCOUNTER — Ambulatory Visit (HOSPITAL_COMMUNITY): Payer: Medicare Other

## 2019-03-04 ENCOUNTER — Ambulatory Visit (HOSPITAL_COMMUNITY): Payer: Medicare Other

## 2019-03-04 ENCOUNTER — Encounter (HOSPITAL_COMMUNITY): Payer: Medicare PPO

## 2019-03-05 ENCOUNTER — Other Ambulatory Visit: Payer: Self-pay | Admitting: Cardiology

## 2019-03-06 ENCOUNTER — Ambulatory Visit (HOSPITAL_COMMUNITY): Payer: Medicare Other

## 2019-03-06 ENCOUNTER — Other Ambulatory Visit: Payer: Self-pay

## 2019-03-06 ENCOUNTER — Encounter (HOSPITAL_COMMUNITY)
Admission: RE | Admit: 2019-03-06 | Discharge: 2019-03-06 | Disposition: A | Discharge status: Home / Self Care | Payer: Medicare PPO | Source: Ambulatory Visit | Attending: Cardiology | Admitting: Cardiology

## 2019-03-06 DIAGNOSIS — Z952 Presence of prosthetic heart valve: Secondary | ICD-10-CM | POA: Diagnosis present

## 2019-03-06 LAB — GLUCOSE, CAPILLARY
Glucose-Capillary: 191 mg/dL — ABNORMAL HIGH (ref 70–99)
Glucose-Capillary: 127 mg/dL — ABNORMAL HIGH (ref 70–99)

## 2019-03-06 NOTE — Progress Notes (Signed)
Daily Session Note  Patient Details  Name: [name] MRN: [id number] Date of Birth: 1940-02-11 Referring Provider:     [facility] from 01/22/2019 in [place]  Referring Provider  [place]. [facility]      Encounter Date: 03/06/2019  Check In: Session Check In - 03/06/19 0903      Check-In   Supervising physician immediately available to respond to emergencies  [facility] immediately available    Physician(s)  Dr. Cyndia Skeeters    Location  [facility]    Staff Present  Jiles Garter, RN, Bjorn Loser, MS, Exercise Physiologist;Portia Rollene Rotunda, RN, BSN;Brittany Durene Fruits, BS, ACSM CEP, Exercise Physiologist    Virtual Visit  No    Medication changes reported      No    Fall or balance concerns reported     No    Tobacco Cessation  No Change    Warm-up and Cool-down  Performed on first and last piece of equipment    Resistance Training Performed  No    VAD Patient?  No    PAD/SET Patient?  No      Pain Assessment   Currently in Pain?  No/denies    Pain Score  0-No pain    Multiple Pain Sites  No       Capillary Blood Glucose: Results for orders placed or performed during the hospital encounter of 03/06/19 (from the past 24 hour(s))  Glucose, capillary     Status: Abnormal   Collection Time: 03/06/19  8:53 AM  Result Value Ref Range   Glucose-Capillary 191 (H) 70 - 99 mg/dL  Glucose, capillary     Status: Abnormal   Collection Time: 03/06/19  9:48 AM  Result Value Ref Range   Glucose-Capillary 127 (H) 70 - 99 mg/dL    Exercise Prescription Changes - 03/06/19 1000      Response to Exercise   Blood Pressure (Admit)  138/50    Blood Pressure (Exercise)  150/74    Blood Pressure (Exit)  124/60    Heart Rate (Admit)  79 bpm    Heart Rate (Exercise)  95 bpm    Heart Rate (Exit)  74 bpm    Rating of Perceived Exertion (Exercise)  15    Symptoms  None    Comments  Pt first day of exercise.     Duration  Continue with 30 min of aerobic exercise without signs/symptoms of physical distress.    Intensity  THRR unchanged      Progression   Progression  Continue to progress workloads to maintain intensity without signs/symptoms of physical distress.    Average METs  2.5      Resistance Training   Training Prescription  No      Interval Training   Interval Training  No      NuStep   Level  2    SPM  85    Minutes  15    METs  2.6      Arm Ergometer   Level  1    Minutes  15    METs  2.4      Home Exercise Plan   Plans to continue exercise at  Home (comment)   Walking   Initial Home Exercises Provided  01/22/19       Social History   Tobacco Use  Smoking Status Never Smoker  Smokeless Tobacco Never Used    Goals Met:  Exercise tolerated  well  Goals Unmet:  Not Applicable  Comments: Pt started cardiac rehab today.  Pt tolerated light exercise without difficulty. VSS, telemetry-SR, asymptomatic.  Medication list reconciled. Pt denies barriers to medicaiton compliance.  PSYCHOSOCIAL ASSESSMENT:  PHQ-0. Pt exhibits positive coping skills, hopeful outlook with supportive family. No psychosocial needs identified at this time, no psychosocial interventions necessary.  Pt oriented to exercise equipment and routine.    Understanding verbalized.    Dr. Fransico Him is Medical Director for Cardiac Rehab at [facility].

## 2019-03-08 ENCOUNTER — Encounter (HOSPITAL_COMMUNITY)
Admission: RE | Admit: 2019-03-08 | Discharge: 2019-03-08 | Disposition: A | Discharge status: Home / Self Care | Payer: Medicare PPO | Source: Ambulatory Visit | Attending: Cardiology | Admitting: Cardiology

## 2019-03-08 ENCOUNTER — Ambulatory Visit (HOSPITAL_COMMUNITY): Payer: Medicare Other

## 2019-03-08 ENCOUNTER — Other Ambulatory Visit: Payer: Self-pay

## 2019-03-08 DIAGNOSIS — Z952 Presence of prosthetic heart valve: Secondary | ICD-10-CM | POA: Diagnosis not present

## 2019-03-08 LAB — GLUCOSE, CAPILLARY
Glucose-Capillary: 149 mg/dL — ABNORMAL HIGH (ref 70–99)
Glucose-Capillary: 166 mg/dL — ABNORMAL HIGH (ref 70–99)

## 2019-03-11 ENCOUNTER — Other Ambulatory Visit: Payer: Self-pay

## 2019-03-11 ENCOUNTER — Encounter (HOSPITAL_COMMUNITY)
Admission: RE | Admit: 2019-03-11 | Discharge: 2019-03-11 | Disposition: A | Discharge status: Home / Self Care | Payer: Medicare PPO | Source: Ambulatory Visit | Attending: Cardiology | Admitting: Cardiology

## 2019-03-11 ENCOUNTER — Ambulatory Visit (HOSPITAL_COMMUNITY): Payer: Medicare Other

## 2019-03-11 DIAGNOSIS — Z952 Presence of prosthetic heart valve: Secondary | ICD-10-CM | POA: Diagnosis not present

## 2019-03-11 LAB — GLUCOSE, CAPILLARY
Glucose-Capillary: 226 mg/dL — ABNORMAL HIGH (ref 70–99)
Glucose-Capillary: 268 mg/dL — ABNORMAL HIGH (ref 70–99)

## 2019-03-13 ENCOUNTER — Encounter (HOSPITAL_COMMUNITY)
Admission: RE | Admit: 2019-03-13 | Discharge: 2019-03-13 | Disposition: A | Discharge status: Home / Self Care | Payer: Medicare PPO | Source: Ambulatory Visit | Attending: Cardiology | Admitting: Cardiology

## 2019-03-13 ENCOUNTER — Other Ambulatory Visit: Payer: Self-pay

## 2019-03-13 ENCOUNTER — Ambulatory Visit (HOSPITAL_COMMUNITY): Payer: Medicare Other

## 2019-03-13 DIAGNOSIS — Z952 Presence of prosthetic heart valve: Secondary | ICD-10-CM | POA: Diagnosis not present

## 2019-03-15 ENCOUNTER — Encounter (HOSPITAL_COMMUNITY): Payer: Medicare PPO

## 2019-03-15 ENCOUNTER — Ambulatory Visit (HOSPITAL_COMMUNITY): Payer: Medicare Other

## 2019-03-18 ENCOUNTER — Ambulatory Visit (HOSPITAL_COMMUNITY): Payer: Medicare Other

## 2019-03-18 ENCOUNTER — Other Ambulatory Visit: Payer: Self-pay

## 2019-03-18 ENCOUNTER — Encounter (HOSPITAL_COMMUNITY)
Admission: RE | Admit: 2019-03-18 | Discharge: 2019-03-18 | Disposition: A | Discharge status: Home / Self Care | Payer: Medicare PPO | Source: Ambulatory Visit | Attending: Cardiology | Admitting: Cardiology

## 2019-03-18 DIAGNOSIS — Z79899 Other long term (current) drug therapy: Secondary | ICD-10-CM | POA: Diagnosis not present

## 2019-03-18 DIAGNOSIS — E11319 Type 2 diabetes mellitus with unspecified diabetic retinopathy without macular edema: Secondary | ICD-10-CM | POA: Insufficient documentation

## 2019-03-18 DIAGNOSIS — E785 Hyperlipidemia, unspecified: Secondary | ICD-10-CM | POA: Insufficient documentation

## 2019-03-18 DIAGNOSIS — E1142 Type 2 diabetes mellitus with diabetic polyneuropathy: Secondary | ICD-10-CM | POA: Insufficient documentation

## 2019-03-18 DIAGNOSIS — Z7982 Long term (current) use of aspirin: Secondary | ICD-10-CM | POA: Diagnosis not present

## 2019-03-18 DIAGNOSIS — Z952 Presence of prosthetic heart valve: Secondary | ICD-10-CM | POA: Diagnosis present

## 2019-03-18 DIAGNOSIS — I2510 Atherosclerotic heart disease of native coronary artery without angina pectoris: Secondary | ICD-10-CM | POA: Insufficient documentation

## 2019-03-18 DIAGNOSIS — N189 Chronic kidney disease, unspecified: Secondary | ICD-10-CM | POA: Insufficient documentation

## 2019-03-18 DIAGNOSIS — Z794 Long term (current) use of insulin: Secondary | ICD-10-CM | POA: Insufficient documentation

## 2019-03-18 DIAGNOSIS — E1122 Type 2 diabetes mellitus with diabetic chronic kidney disease: Secondary | ICD-10-CM | POA: Diagnosis not present

## 2019-03-18 DIAGNOSIS — I129 Hypertensive chronic kidney disease with stage 1 through stage 4 chronic kidney disease, or unspecified chronic kidney disease: Secondary | ICD-10-CM | POA: Insufficient documentation

## 2019-03-20 ENCOUNTER — Encounter (HOSPITAL_COMMUNITY): Payer: Medicare PPO

## 2019-03-20 ENCOUNTER — Ambulatory Visit (HOSPITAL_COMMUNITY): Payer: Medicare Other

## 2019-03-21 ENCOUNTER — Telehealth: Payer: Self-pay | Admitting: Neurology

## 2019-03-21 NOTE — Telephone Encounter (Signed)
This patient was seen by Dr. Kathlen Mody from ophthalmology on 17 January 2019.  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  She has some degree of diabetic retinopathy.  She has a history of macular edema on the left as well treated with Avastin.  A prior central retinal vein occlusion with macular edema on the left was noted.  She has a history of diabetes.

## 2019-03-22 ENCOUNTER — Other Ambulatory Visit: Payer: Self-pay

## 2019-03-22 ENCOUNTER — Encounter (HOSPITAL_COMMUNITY)
Admission: RE | Admit: 2019-03-22 | Discharge: 2019-03-22 | Disposition: A | Discharge status: Home / Self Care | Payer: Medicare PPO | Source: Ambulatory Visit | Attending: Cardiology | Admitting: Cardiology

## 2019-03-22 ENCOUNTER — Ambulatory Visit (HOSPITAL_COMMUNITY): Payer: Medicare Other

## 2019-03-22 DIAGNOSIS — Z952 Presence of prosthetic heart valve: Secondary | ICD-10-CM | POA: Diagnosis not present

## 2019-03-25 ENCOUNTER — Other Ambulatory Visit: Payer: Self-pay

## 2019-03-25 ENCOUNTER — Encounter (HOSPITAL_COMMUNITY)
Admission: RE | Admit: 2019-03-25 | Discharge: 2019-03-25 | Disposition: A | Discharge status: Home / Self Care | Payer: Medicare PPO | Source: Ambulatory Visit | Attending: Cardiology | Admitting: Cardiology

## 2019-03-25 DIAGNOSIS — Z952 Presence of prosthetic heart valve: Secondary | ICD-10-CM

## 2019-03-26 NOTE — Progress Notes (Signed)
I have reviewed a Home Exercise Prescription with [name] . [name] is currently exercising at home.  The patient was advised to walk 2-4 days a week for 30-45 minutes.  Ronna and I discussed how to progress their exercise prescription.  The patient stated that their goals were to add minutes to her walk each day by walking 10 minutes 2x per day 2-4 days per week in addition to CR program.  The patient stated that they understand the exercise prescription.  We reviewed exercise guidelines, target heart rate during exercise, RPE Scale, weather conditions, NTG use, endpoints for exercise, warmup and cool down.  Patient is encouraged to come to me with any questions. I will continue to follow up with the patient to assist them with progression and safety.    Deitra Mayo BS, ACSM CEP 03/25/2019 0900

## 2019-03-27 ENCOUNTER — Encounter (HOSPITAL_COMMUNITY)
Admission: RE | Admit: 2019-03-27 | Discharge: 2019-03-27 | Disposition: A | Discharge status: Home / Self Care | Payer: Medicare PPO | Source: Ambulatory Visit | Attending: Cardiology | Admitting: Cardiology

## 2019-03-27 ENCOUNTER — Other Ambulatory Visit: Payer: Self-pay

## 2019-03-27 DIAGNOSIS — Z952 Presence of prosthetic heart valve: Secondary | ICD-10-CM

## 2019-03-28 NOTE — Progress Notes (Signed)
Cardiac Individual Treatment Plan  Patient Details  Name: [name] MRN: [id number] Date of Birth: 1940/04/04 Referring Provider:     [facility] from 01/22/2019 in [facility]  Referring Provider  Norman. Manish      Initial Encounter Date:    CARDIAC REHAB PHASE II ORIENTATION from 01/22/2019 in [facility]  Date  02/18/19      Visit Diagnosis: 11/13/18 TAVR  Patient's Home Medications on Admission:  Current Outpatient Medications:  .  acetaminophen (TYLENOL) 325 MG tablet, Take 650 mg by mouth every 6 (six) hours as needed for moderate pain or headache. , Disp: , Rfl:  .  allopurinol (ZYLOPRIM) 100 MG tablet, Take 50 mg by mouth daily., Disp: , Rfl:  .  amLODipine (NORVASC) 10 MG tablet, Take 1 tablet by mouth every day (Patient taking differently: Take 10 mg by mouth daily. ), Disp: 30 tablet, Rfl: 11 .  aspirin EC 81 MG tablet, Take 81 mg by mouth daily., Disp: , Rfl:  .  B-D INS SYR ULTRAFINE 1CC/30G 30G X 1/2" 1 ML MISC, Inject 1 each into the skin 3 (three) times daily between meals., Disp: , Rfl:  .  calcitRIOL (ROCALTROL) 0.25 MCG capsule, Take 0.25 mcg by mouth daily. , Disp: , Rfl:  .  clopidogrel (PLAVIX) 75 MG tablet, Take 1 tablet (75 mg total) by mouth daily., Disp: 90 tablet, Rfl: 1 .  folic acid (FOLVITE) 1 MG tablet, Take 1 mg by mouth daily., Disp: , Rfl:  .  HUMALOG KWIKPEN 100 UNIT/ML KwikPen, Inject 2-8 Units into the skin 3 (three) times daily before meals. , Disp: , Rfl:  .  hydrALAZINE (APRESOLINE) 25 MG tablet, TAKE 1 TABLET BY MOUTH THREE TIMES A DAY, Disp: 270 tablet, Rfl: 0 .  insulin glargine (LANTUS) 100 UNIT/ML injection, Inject 16 Units into the skin at bedtime. , Disp: , Rfl:  .  labetalol (NORMODYNE) 100 MG tablet, Take 100 mg by mouth 2 (two) times daily., Disp: , Rfl:  .  leflunomide (ARAVA) 10 MG tablet, Take 10 mg by mouth daily., Disp: , Rfl:  .  niacin  (NIASPAN) 500 MG CR tablet, Take 500 mg by mouth daily. , Disp: , Rfl:  .  nitroGLYCERIN (NITROSTAT) 0.4 MG SL tablet, PLACE 1 TABLET (0.4 MG TOTAL) UNDER THE TONGUE EVERY 5 MINUTES X 3 DOSES AS NEEDED FOR CHEST PAIN., Disp: 25 tablet, Rfl: 1 .  nortriptyline (PAMELOR) 10 MG capsule, Take 10 mg by mouth daily., Disp: , Rfl:  .  ONE TOUCH ULTRA TEST test strip, 1 each by Other route 3 (three) times daily between meals., Disp: , Rfl:  .  polyvinyl alcohol (TEARS AGAIN) 1.4 % ophthalmic solution, Place 1 drop into both eyes 3 (three) times daily as needed for dry eyes., Disp: , Rfl:  .  PRISTIQ 100 MG 24 hr tablet, Take 100 mg by mouth daily. , Disp: , Rfl:  .  rosuvastatin (CRESTOR) 20 MG tablet, Take 20 mg by mouth daily., Disp: , Rfl:  .  torsemide (DEMADEX) 20 MG tablet, Take 1 tablet (20 mg total) by mouth daily. May take additional dose in the afternoon for swelling, shortness of breath or weight gain., Disp: 180 tablet, Rfl: 0  Past Medical History: Past Medical History:  Diagnosis Date  . Arthritis   . Cerebrovascular disease   . CKD (chronic kidney disease)    Sees Dr Hollie Salk  .  Coronary artery disease   . Depression   . Diabetes ([place])    INSULIN DEPENDENT  . Diabetic peripheral neuropathy ([place])   . Diabetic retinopathy ([place])   . Diverticulitis   . GERD (gastroesophageal reflux disease)   . History of CVA (cerebrovascular accident)    x 2 no residulal  . Hyperlipidemia   . Hypertension   . Obesity   . Renal lesion   . S/P TAVR (transcatheter aortic valve replacement) 11/13/2018   s/p TAVR with a 42mm Edwards S3U via the TF approach by Dr. [name] and Dr. Angelena Form  . Severe aortic stenosis     Tobacco Use: Social History   Tobacco Use  Smoking Status Never Smoker  Smokeless Tobacco Never Used    Labs: Recent Review Flowsheet Data    Labs for ITP Cardiac and Pulmonary Rehab Latest Ref Rng & Units 10/23/2018 11/09/2018 11/13/2018 11/13/2018 11/13/2018   Cholestrol 0 - 200  mg/dL - - - - -   LDLCALC 0 - 99 mg/dL - - - - -   HDL >40 mg/dL - - - - -   Trlycerides <150 mg/dL - - - - -   Hemoglobin A1c 4.8 - 5.6 % - 7.6(H) - - -   PHART 7.350 - 7.450 - - - - -   PCO2ART 32.0 - 48.0 mmHg - - - - -   HCO3 20.0 - 28.0 mmol/L 22.4 - - - -   TCO2 22 - 32 mmol/L 23 - 22 22 23    ACIDBASEDEF 0.0 - 2.0 mmol/L 2.0 - - - -   O2SAT % 64.0 - - - -      Capillary Blood Glucose: Lab Results  Component Value Date   GLUCAP 268 (H) 03/11/2019   GLUCAP 226 (H) 03/11/2019   GLUCAP 166 (H) 03/08/2019   GLUCAP 149 (H) 03/08/2019   GLUCAP 127 (H) 03/06/2019     Exercise Target Goals: Exercise Program Goal: Individual exercise prescription set using results from initial 6 min walk test and THRR while considering  patient's activity barriers and safety.   Exercise Prescription Goal: Initial exercise prescription builds to 30-45 minutes a day of aerobic activity, 2-3 days per week.  Home exercise guidelines will be given to patient during program as part of exercise prescription that the participant will acknowledge.  Activity Barriers & Risk Stratification: Activity Barriers & Cardiac Risk Stratification - 01/22/19 0926      Activity Barriers & Cardiac Risk Stratification   Activity Barriers  Other (comment)    Comments  Some left sided weakness from stoke in August 2020.    Cardiac Risk Stratification  High       6 Minute Walk: 6 Minute Walk    Row Name 01/22/19 0936         6 Minute Walk   Phase  Initial     Distance  1400 feet     Walk Time  6 minutes     # of Rest Breaks  0     MPH  2.65     METS  2.24     RPE  11     Perceived Dyspnea   0     VO2 Peak  7.83     Symptoms  No     Resting HR  61 bpm     Resting BP  118/64     Resting Oxygen Saturation   100 %     Exercise Oxygen Saturation  during 6 min  walk  100 %     Max Ex. HR  79 bpm     Max Ex. BP  148/42     2 Minute Post BP  154/52        Oxygen Initial Assessment:   Oxygen  Re-Evaluation:   Oxygen Discharge (Final Oxygen Re-Evaluation):   Initial Exercise Prescription: Initial Exercise Prescription - 02/18/19 1000      Date of Initial Exercise RX and Referring Provider   Date  02/18/19    Referring Provider  Patwardhan. Manish      NuStep   Level  2    SPM  85    Minutes  15    METs  2.2      Arm Ergometer   Level  2    Watts  20    Minutes  15    METs  2.4      Track   Minutes  --      Prescription Details   Frequency (times per week)  3    Duration  Progress to 30 minutes of continuous aerobic without signs/symptoms of physical distress      Intensity   THRR 40-80% of Max Heartrate  56-112    Ratings of Perceived Exertion  11-13    Perceived Dyspnea  0-4      Progression   Progression  Continue to progress workloads to maintain intensity without signs/symptoms of physical distress.      Resistance Training   Training Prescription  Yes    Weight  2lbs    Reps  10-15       Perform Capillary Blood Glucose checks as needed.  Exercise Prescription Changes: Exercise Prescription Changes    Row Name 02/18/19 1000 03/06/19 1000 03/25/19 1100         Response to Exercise   Blood Pressure (Admit)  -  138/50  138/54     Blood Pressure (Exercise)  -  150/74  154/64     Blood Pressure (Exit)  -  124/60  134/60     Heart Rate (Admit)  -  79 bpm  74 bpm     Heart Rate (Exercise)  -  95 bpm  91 bpm     Heart Rate (Exit)  -  74 bpm  68 bpm     Rating of Perceived Exertion (Exercise)  -  15  13     Symptoms  -  None  None     Comments  -  Pt first day of exercise.   -     Duration  -  Continue with 30 min of aerobic exercise without signs/symptoms of physical distress.  Continue with 30 min of aerobic exercise without signs/symptoms of physical distress.     Intensity  -  THRR unchanged  THRR unchanged       Progression   Progression  -  Continue to progress workloads to maintain intensity without signs/symptoms of physical distress.   Continue to progress workloads to maintain intensity without signs/symptoms of physical distress.     Average METs  -  2.5  2.4       Resistance Training   Training Prescription  -  No  Yes     Weight  -  -  3 lbs.      Reps  -  -  10-15     Time  -  -  10 Minutes       Interval Training   Interval  Training  -  No  No       NuStep   Level  -  2  3     SPM  -  85  85     Minutes  -  15  15     METs  -  2.6  2.6       Arm Ergometer   Level  -  1  2     Minutes  -  15  15     METs  -  2.4  2.2       Home Exercise Plan   Plans to continue exercise at  Home (comment) Walking  Home (comment) Walking  Home (comment) Walking     Initial Home Exercises Provided  01/22/19  01/22/19  01/22/19        Exercise Comments: Exercise Comments    Row Name 01/22/19 0926 02/18/19 1050 02/25/19 1046 03/06/19 1014 03/25/19 0900   Exercise Comments  Reviewed home exericse plan with patient.  Updated exercise prescription for onsite cardiac rehab program.  Patient will be contacted to begin participation in the onsite cardiac rehab program after temporary closure due to COVID-19 pandemic.  Pt first day of exercise. Pt tolerated exercisae session well.  Reviewed HEP with Pt. Pt understands goals.      Exercise Goals and Review: Exercise Goals    Row Name 01/22/19 0926             Exercise Goals   Increase Physical Activity  Yes       Intervention  Provide advice, education, support and counseling about physical activity/exercise needs.;Develop an individualized exercise prescription for aerobic and resistive training based on initial evaluation findings, risk stratification, comorbidities and participant's personal goals.       Expected Outcomes  Short Term: Attend rehab on a regular basis to increase amount of physical activity.;Long Term: Exercising regularly at least 3-5 days a week.;Long Term: Add in home exercise to make exercise part of routine and to increase amount of physical activity.        Increase Strength and Stamina  Yes       Intervention  Provide advice, education, support and counseling about physical activity/exercise needs.;Develop an individualized exercise prescription for aerobic and resistive training based on initial evaluation findings, risk stratification, comorbidities and participant's personal goals.       Expected Outcomes  Short Term: Increase workloads from initial exercise prescription for resistance, speed, and METs.;Short Term: Perform resistance training exercises routinely during rehab and add in resistance training at home;Long Term: Improve cardiorespiratory fitness, muscular endurance and strength as measured by increased METs and functional capacity (6MWT)       Able to understand and use rate of perceived exertion (RPE) scale  Yes       Intervention  Provide education and explanation on how to use RPE scale       Expected Outcomes  Short Term: Able to use RPE daily in rehab to express subjective intensity level;Long Term:  Able to use RPE to guide intensity level when exercising independently       Knowledge and understanding of Target Heart Rate Range (THRR)  Yes       Intervention  Provide education and explanation of THRR including how the numbers were predicted and where they are located for reference       Expected Outcomes  Short Term: Able to state/look up THRR;Long Term: Able to use THRR to govern intensity when exercising  independently;Short Term: Able to use daily as guideline for intensity in rehab       Able to check pulse independently  Yes       Intervention  Provide education and demonstration on how to check pulse in carotid and radial arteries.;Review the importance of being able to check your own pulse for safety during independent exercise       Expected Outcomes  Short Term: Able to explain why pulse checking is important during independent exercise;Long Term: Able to check pulse independently and accurately       Understanding of  Exercise Prescription  Yes       Intervention  Provide education, explanation, and written materials on patient's individual exercise prescription       Expected Outcomes  Short Term: Able to explain program exercise prescription;Long Term: Able to explain home exercise prescription to exercise independently          Exercise Goals Re-Evaluation : Exercise Goals Re-Evaluation    Row Name 02/25/19 1043 03/06/19 1013 03/25/19 0900         Exercise Goal Re-Evaluation   Exercise Goals Review  -  Increase Physical Activity;Increase Strength and Stamina;Able to understand and use rate of perceived exertion (RPE) scale;Knowledge and understanding of Target Heart Rate Range (THRR);Able to check pulse independently;Understanding of Exercise Prescription  Increase Physical Activity;Increase Strength and Stamina;Able to understand and use rate of perceived exertion (RPE) scale;Knowledge and understanding of Target Heart Rate Range (THRR);Able to check pulse independently;Understanding of Exercise Prescription     Comments  Patient is not active in the virtual cardaic rehab ap. Will continue to check exercise progress via weekly phone calls until patient is schedule to begin the onsite cardiac rehab program. Patient is walking 5-10 minutes in house daily, stretching and using 5 lb. hand werights. Patient will increase duration by 1 minute each session until reaching 15 minutes daily.  Pt first day of exercise in [facility] program. Pt tolerated exercise Rx well. Pt understands THRR, RPE scale, and exercise Rx.  Reviewed HEP with Pt. Pt is currently wlkaing at home 5-10 minutes once per day. Encouraged Pt to increase her time or walk 10 minutes 2x per day 2-4 days per week. Pt expressed understanding. Spoke with Pt about THRR, RPE scale, end points of exercise, and weather precautions, Pt understands goals.     Expected Outcomes  Patient will transition to exercise in the onsite cardiac rehab program.  Will continue to  monitor and progress Pt as tolerated.  Will continue to monitor and progress Pt as tolerated.        Discharge Exercise Prescription (Final Exercise Prescription Changes): Exercise Prescription Changes - 03/25/19 1100      Response to Exercise   Blood Pressure (Admit)  138/54    Blood Pressure (Exercise)  154/64    Blood Pressure (Exit)  134/60    Heart Rate (Admit)  74 bpm    Heart Rate (Exercise)  91 bpm    Heart Rate (Exit)  68 bpm    Rating of Perceived Exertion (Exercise)  13    Symptoms  None    Duration  Continue with 30 min of aerobic exercise without signs/symptoms of physical distress.    Intensity  THRR unchanged      Progression   Progression  Continue to progress workloads to maintain intensity without signs/symptoms of physical distress.    Average METs  2.4      Resistance Training   Training Prescription  Yes  Weight  3 lbs.     Reps  10-15    Time  10 Minutes      Interval Training   Interval Training  No      NuStep   Level  3    SPM  85    Minutes  15    METs  2.6      Arm Ergometer   Level  2    Minutes  15    METs  2.2      Home Exercise Plan   Plans to continue exercise at  Home (comment)   Walking   Initial Home Exercises Provided  01/22/19       Nutrition:  Target Goals: Understanding of nutrition guidelines, daily intake of sodium 1500mg , cholesterol 200mg , calories 30% from fat and 7% or less from saturated fats, daily to have 5 or more servings of fruits and vegetables.  Biometrics: Pre Biometrics - 01/22/19 0926      Pre Biometrics   Height  5\' 3"  (1.6 m)    Weight  75.9 kg    Waist Circumference  37.25 inches    Hip Circumference  42.25 inches    Waist to Hip Ratio  0.88 %    BMI (Calculated)  29.65    Triceps Skinfold  36 mm    % Body Fat  43.2 %    Grip Strength  27.5 kg    Flexibility  0 in    Single Leg Stand  3.12 seconds        Nutrition Therapy Plan and Nutrition Goals: Nutrition Therapy & Goals -  03/15/19 0618      Nutrition Therapy   Diet  Heart healthy/CKD/Carb modified    Drug/Food Interactions  Statins/Certain Fruits      Personal Nutrition Goals   Nutrition Goal  Pt to build a healthy plate including vegetables, fruits, whole grains, and low-fat dairy products in a heart healthy meal plan.    Personal Goal #2  Pt to continue limiting sodium, phosphorus, and potassium rich foods    Personal Goal #3  CBG concentrations in the normal range or as close to normal as is safely possible.      Intervention Plan   Intervention  Prescribe, educate and counsel regarding individualized specific dietary modifications aiming towards targeted core components such as weight, hypertension, lipid management, diabetes, heart failure and other comorbidities.;Nutrition handout(s) given to patient.    Expected Outcomes  Short Term Goal: A plan has been developed with personal nutrition goals set during dietitian appointment.       Nutrition Assessments:   Nutrition Goals Re-Evaluation: Nutrition Goals Re-Evaluation    [place] Name 03/15/19 0620             Goals   Current Weight  162 lb (73.5 kg)       Nutrition Goal  Pt to build a healthy plate including vegetables, fruits, whole grains, and low-fat dairy products in a heart healthy meal plan.         Personal Goal #2 Re-Evaluation   Personal Goal #2  Pt to continue limiting sodium, phosphorus, and potassium rich foods         Personal Goal #3 Re-Evaluation   Personal Goal #3  CBG concentrations in the normal range or as close to normal as is safely possible.          Nutrition Goals Re-Evaluation: Nutrition Goals Re-Evaluation    [place] Name 03/15/19 [id number]  Goals   Current Weight  162 lb (73.5 kg)       Nutrition Goal  Pt to build a healthy plate including vegetables, fruits, whole grains, and low-fat dairy products in a heart healthy meal plan.         Personal Goal #2 Re-Evaluation   Personal Goal #2  Pt to continue  limiting sodium, phosphorus, and potassium rich foods         Personal Goal #3 Re-Evaluation   Personal Goal #3  CBG concentrations in the normal range or as close to normal as is safely possible.          Nutrition Goals Discharge (Final Nutrition Goals Re-Evaluation): Nutrition Goals Re-Evaluation - 03/15/19 0620      Goals   Current Weight  162 lb (73.5 kg)    Nutrition Goal  Pt to build a healthy plate including vegetables, fruits, whole grains, and low-fat dairy products in a heart healthy meal plan.      Personal Goal #2 Re-Evaluation   Personal Goal #2  Pt to continue limiting sodium, phosphorus, and potassium rich foods      Personal Goal #3 Re-Evaluation   Personal Goal #3  CBG concentrations in the normal range or as close to normal as is safely possible.       Psychosocial: Target Goals: Acknowledge presence or absence of significant depression and/or stress, maximize coping skills, provide positive support system. Participant is able to verbalize types and ability to use techniques and skills needed for reducing stress and depression.  Initial Review & Psychosocial Screening: Initial Psych Review & Screening - 01/22/19 1633      Initial Review   Current issues with  None Identified      Family Dynamics   Good Support System?  Yes   Keishawna has a brother that lives near by for support and a grandaughter     Barriers   Psychosocial barriers to participate in program  There are no identifiable barriers or psychosocial needs.      Screening Interventions   Interventions  Encouraged to exercise       Quality of Life Scores: Quality of Life - 01/22/19 1228      Quality of Life   Select  Quality of Life      Quality of Life Scores   Health/Function Pre  23.67 %    Socioeconomic Pre  27.5 %    Psych/Spiritual Pre  26.57 %    Family Pre  23.5 %    GLOBAL Pre  25.03 %      Scores of 19 and below usually indicate a poorer quality of life in these areas.  A  difference of  2-3 points is a clinically meaningful difference.  A difference of 2-3 points in the total score of the Quality of Life Index has been associated with significant improvement in overall quality of life, self-image, physical symptoms, and general health in studies assessing change in quality of life.  PHQ-9: Recent Review Flowsheet Data    Depression screen [facility] 2/9 01/22/2019 09/15/2015   Decreased Interest 0 0   Down, Depressed, Hopeless 0 0   PHQ - 2 Score 0 0     Interpretation of Total Score  Total Score Depression Severity:  1-4 = Minimal depression, 5-9 = Mild depression, 10-14 = Moderate depression, 15-19 = Moderately severe depression, 20-27 = Severe depression   Psychosocial Evaluation and Intervention: Psychosocial Evaluation - 02/25/19 1107      Psychosocial  Evaluation & Interventions   Interventions  Therapist referral;Encouraged to exercise with the program and follow exercise prescription    Comments  Patient recently expressed symptoms of depression. She was referred to and contacted by hospital staff counselor.    Expected Outcomes  Patient will continue to work with counselor and have open, ongoing conversations with [facility] staff about her depression symptoms    Continue Psychosocial Services   Follow up required by staff       Psychosocial Re-Evaluation: Psychosocial Re-Evaluation    [place] Name 03/06/19 1038 03/25/19 1041           Psychosocial Re-Evaluation   Current issues with  Current Depression  Current Depression      Comments  Lun expresses some depression and grief related to an anniversary marking the death of family members.  She has been contacted by a Social worker.  She states that she enjoys reading.  Joarn reports continuing to work with a Social worker and relying on her support system for her grief.  She feels that these resources are helpful to her.      Expected Outcomes  Jonae will continue to utilize her resources and support system as she works  through her grief.  Riddhi will continue to utilize her resources and support system as she works through her grief.      Interventions  Encouraged to attend Cardiac Rehabilitation for the exercise;Relaxation education;Stress management education  Encouraged to attend Cardiac Rehabilitation for the exercise;Relaxation education;Stress management education      Continue Psychosocial Services   Follow up required by counselor  Follow up required by counselor         Psychosocial Discharge (Final Psychosocial Re-Evaluation): Psychosocial Re-Evaluation - 03/25/19 1041      Psychosocial Re-Evaluation   Current issues with  Current Depression    Comments  Joarn reports continuing to work with a Social worker and relying on her support system for her grief.  She feels that these resources are helpful to her.    Expected Outcomes  Brayla will continue to utilize her resources and support system as she works through her grief.    Interventions  Encouraged to attend Cardiac Rehabilitation for the exercise;Relaxation education;Stress management education    Continue Psychosocial Services   Follow up required by counselor       Vocational Rehabilitation: Provide vocational rehab assistance to qualifying candidates.   Vocational Rehab Evaluation & Intervention: Vocational Rehab - 01/22/19 1635      Initial Vocational Rehab Evaluation & Intervention   Assessment shows need for Vocational Rehabilitation  No       Education: Education Goals: Education classes will be provided on a weekly basis, covering required topics. Participant will state understanding/return demonstration of topics presented.  Learning Barriers/Preferences: Learning Barriers/Preferences - 01/22/19 1231      Learning Barriers/Preferences   Learning Barriers  Sight    Learning Preferences  --   Visual      Education Topics: Count Your Pulse:  -Group instruction provided by verbal instruction, demonstration, patient  participation and written materials to support subject.  Instructors address importance of being able to find your pulse and how to count your pulse when at home without a heart monitor.  Patients get hands on experience counting their pulse with staff help and individually.   Heart Attack, Angina, and Risk Factor Modification:  -Group instruction provided by verbal instruction, video, and written materials to support subject.  Instructors address signs and symptoms of angina and heart  attacks.    Also discuss risk factors for heart disease and how to make changes to improve heart health risk factors.   Functional Fitness:  -Group instruction provided by verbal instruction, demonstration, patient participation, and written materials to support subject.  Instructors address safety measures for doing things around the house.  Discuss how to get up and down off the floor, how to pick things up properly, how to safely get out of a chair without assistance, and balance training.   Meditation and Mindfulness:  -Group instruction provided by verbal instruction, patient participation, and written materials to support subject.  Instructor addresses importance of mindfulness and meditation practice to help reduce stress and improve awareness.  Instructor also leads participants through a meditation exercise.    Stretching for Flexibility and Mobility:  -Group instruction provided by verbal instruction, patient participation, and written materials to support subject.  Instructors lead participants through series of stretches that are designed to increase flexibility thus improving mobility.  These stretches are additional exercise for major muscle groups that are typically performed during regular warm up and cool down.   Hands Only CPR:  -Group verbal, video, and participation provides a basic overview of AHA guidelines for community CPR. Role-play of emergencies allow participants the opportunity to  practice calling for help and chest compression technique with discussion of AED use.   Hypertension: -Group verbal and written instruction that provides a basic overview of hypertension including the most recent diagnostic guidelines, risk factor reduction with self-care instructions and medication management.    Nutrition I class: Heart Healthy Eating:  -Group instruction provided by PowerPoint slides, verbal discussion, and written materials to support subject matter. The instructor gives an explanation and review of the Therapeutic Lifestyle Changes diet recommendations, which includes a discussion on lipid goals, dietary fat, sodium, fiber, plant stanol/sterol esters, sugar, and the components of a well-balanced, healthy diet.   Nutrition II class: Lifestyle Skills:  -Group instruction provided by PowerPoint slides, verbal discussion, and written materials to support subject matter. The instructor gives an explanation and review of label reading, grocery shopping for heart health, heart healthy recipe modifications, and ways to make healthier choices when eating out.   Diabetes Question & Answer:  -Group instruction provided by PowerPoint slides, verbal discussion, and written materials to support subject matter. The instructor gives an explanation and review of diabetes co-morbidities, pre- and post-prandial blood glucose goals, pre-exercise blood glucose goals, signs, symptoms, and treatment of hypoglycemia and hyperglycemia, and foot care basics.   Diabetes Blitz:  -Group instruction provided by PowerPoint slides, verbal discussion, and written materials to support subject matter. The instructor gives an explanation and review of the physiology behind type 1 and type 2 diabetes, diabetes medications and rational behind using different medications, pre- and post-prandial blood glucose recommendations and Hemoglobin A1c goals, diabetes diet, and exercise including blood glucose guidelines  for exercising safely.    Portion Distortion:  -Group instruction provided by PowerPoint slides, verbal discussion, written materials, and food models to support subject matter. The instructor gives an explanation of serving size versus portion size, changes in portions sizes over the last 20 years, and what consists of a serving from each food group.   Stress Management:  -Group instruction provided by verbal instruction, video, and written materials to support subject matter.  Instructors review role of stress in heart disease and how to cope with stress positively.     Exercising on Your Own:  -Group instruction provided by verbal instruction, power  point, and written materials to support subject.  Instructors discuss benefits of exercise, components of exercise, frequency and intensity of exercise, and end points for exercise.  Also discuss use of nitroglycerin and activating EMS.  Review options of places to exercise outside of rehab.  Review guidelines for sex with heart disease.   Cardiac Drugs I:  -Group instruction provided by verbal instruction and written materials to support subject.  Instructor reviews cardiac drug classes: antiplatelets, anticoagulants, beta blockers, and statins.  Instructor discusses reasons, side effects, and lifestyle considerations for each drug class.   Cardiac Drugs II:  -Group instruction provided by verbal instruction and written materials to support subject.  Instructor reviews cardiac drug classes: angiotensin converting enzyme inhibitors (ACE-I), angiotensin II receptor blockers (ARBs), nitrates, and calcium channel blockers.  Instructor discusses reasons, side effects, and lifestyle considerations for each drug class.   Anatomy and Physiology of the Circulatory System:  Group verbal and written instruction and models provide basic cardiac anatomy and physiology, with the coronary electrical and arterial systems. Review of: AMI, Angina, Valve  disease, Heart Failure, Peripheral Artery Disease, Cardiac Arrhythmia, Pacemakers, and the ICD.   Other Education:  -Group or individual verbal, written, or video instructions that support the educational goals of the cardiac rehab program.   Holiday Eating Survival Tips:  -Group instruction provided by PowerPoint slides, verbal discussion, and written materials to support subject matter. The instructor gives patients tips, tricks, and techniques to help them not only survive but enjoy the holidays despite the onslaught of food that accompanies the holidays.   Knowledge Questionnaire Score: Knowledge Questionnaire Score - 01/22/19 1229      Knowledge Questionnaire Score   Pre Score  22/24       Core Components/Risk Factors/Patient Goals at Admission: Personal Goals and Risk Factors at Admission - 01/22/19 1635      Core Components/Risk Factors/Patient Goals on Admission    Weight Management  Yes;Weight Maintenance;Weight Loss    Intervention  Weight Management: Develop a combined nutrition and exercise program designed to reach desired caloric intake, while maintaining appropriate intake of nutrient and fiber, sodium and fats, and appropriate energy expenditure required for the weight goal.;Weight Management: Provide education and appropriate resources to help participant work on and attain dietary goals.;Weight Management/Obesity: Establish reasonable short term and long term weight goals.    Diabetes  Yes    Intervention  Provide education about signs/symptoms and action to take for hypo/hyperglycemia.;Provide education about proper nutrition, including hydration, and aerobic/resistive exercise prescription along with prescribed medications to achieve blood glucose in normal ranges: Fasting glucose 65-99 mg/dL    Expected Outcomes  Short Term: Participant verbalizes understanding of the signs/symptoms and immediate care of hyper/hypoglycemia, proper foot care and importance of  medication, aerobic/resistive exercise and nutrition plan for blood glucose control.;Long Term: Attainment of HbA1C < 7%.    Hypertension  Yes    Intervention  Provide education on lifestyle modifcations including regular physical activity/exercise, weight management, moderate sodium restriction and increased consumption of fresh fruit, vegetables, and low fat dairy, alcohol moderation, and smoking cessation.;Monitor prescription use compliance.    Expected Outcomes  Short Term: Continued assessment and intervention until BP is < 140/90mm HG in hypertensive participants. < 130/29mm HG in hypertensive participants with diabetes, heart failure or chronic kidney disease.;Long Term: Maintenance of blood pressure at goal levels.    Lipids  Yes    Intervention  Provide education and support for participant on nutrition & aerobic/resistive exercise along with prescribed  medications to achieve LDL 70mg , HDL >40mg .    Expected Outcomes  Short Term: Participant states understanding of desired cholesterol values and is compliant with medications prescribed. Participant is following exercise prescription and nutrition guidelines.;Long Term: Cholesterol controlled with medications as prescribed, with individualized exercise RX and with personalized nutrition plan. Value goals: LDL < 70mg , HDL > 40 mg.       Core Components/Risk Factors/Patient Goals Review:  Goals and Risk Factor Review    Row Name 02/25/19 1109 03/06/19 1041 03/22/19 1201         Core Components/Risk Factors/Patient Goals Review   Personal Goals Review  Weight Management/Obesity;Diabetes;Hypertension;Lipids  Weight Management/Obesity;Diabetes;Hypertension;Lipids  Weight Management/Obesity;Diabetes;Hypertension;Lipids     Review  Patient with multiple CAD risk factors.  Continues to participate in cardiac rehab plan of care at home independently. Exercise limited recently by feeling unwell.  Patient with multiple CAD risk factors.  She started  exercise today and tolerated it well.  She would like to be able to maintain her fluid balance.  Patient with multiple CAD risk factors.  Hera continues to tolerate exerise well.     Expected Outcomes  Patient will continue to participate in cardiac rehab for risk factor modification  Patient will continue to participate in cardiac rehab for risk factor modification  Patient will continue to participate in cardiac rehab for risk factor modification.        Core Components/Risk Factors/Patient Goals at Discharge (Final Review):  Goals and Risk Factor Review - 03/22/19 1201      Core Components/Risk Factors/Patient Goals Review   Personal Goals Review  Weight Management/Obesity;Diabetes;Hypertension;Lipids    Review  Patient with multiple CAD risk factors.  Hisae continues to tolerate exerise well.    Expected Outcomes  Patient will continue to participate in cardiac rehab for risk factor modification.       ITP Comments: ITP Comments    Row Name 01/22/19 0857 02/25/19 1105 03/06/19 1034 03/22/19 1159     ITP Comments  Medical Director- Dr. Fransico Him, MD  30 day ITP review: In-person Cardiac Rehab exercise session will resume. Patient will be contacted to see if interested. Currently patient is following their cardiac rehab plan of care independently at home. She recently has not been feeling well (see progress note) and instructed to continue all components of her care plan but limit her exercise until she is feeling better.  Pt started exercise today and tolerated it well.  30 Day ITP.  Austyn continues to tolerate exercise well. VSS.       Comments: See ITP Comments.

## 2019-03-29 ENCOUNTER — Encounter (HOSPITAL_COMMUNITY)
Admission: RE | Admit: 2019-03-29 | Discharge: 2019-03-29 | Disposition: A | Discharge status: Home / Self Care | Payer: Medicare PPO | Source: Ambulatory Visit | Attending: Cardiology | Admitting: Cardiology

## 2019-03-29 ENCOUNTER — Other Ambulatory Visit: Payer: Self-pay

## 2019-03-29 DIAGNOSIS — Z952 Presence of prosthetic heart valve: Secondary | ICD-10-CM | POA: Diagnosis not present

## 2019-03-29 LAB — GLUCOSE, CAPILLARY: Glucose-Capillary: 81 mg/dL (ref 70–99)

## 2019-04-01 ENCOUNTER — Other Ambulatory Visit: Payer: Self-pay

## 2019-04-01 ENCOUNTER — Encounter (HOSPITAL_COMMUNITY)
Admission: RE | Admit: 2019-04-01 | Discharge: 2019-04-01 | Disposition: A | Discharge status: Home / Self Care | Payer: Medicare PPO | Source: Ambulatory Visit | Attending: Cardiology | Admitting: Cardiology

## 2019-04-01 DIAGNOSIS — Z952 Presence of prosthetic heart valve: Secondary | ICD-10-CM | POA: Diagnosis not present

## 2019-04-01 LAB — GLUCOSE, CAPILLARY: Glucose-Capillary: 145 mg/dL — ABNORMAL HIGH (ref 70–99)

## 2019-04-03 ENCOUNTER — Encounter (HOSPITAL_COMMUNITY): Payer: Medicare PPO

## 2019-04-05 ENCOUNTER — Encounter (HOSPITAL_COMMUNITY): Payer: Medicare PPO

## 2019-04-08 ENCOUNTER — Encounter (HOSPITAL_COMMUNITY)
Admission: RE | Admit: 2019-04-08 | Discharge: 2019-04-08 | Disposition: A | Discharge status: Home / Self Care | Payer: Medicare PPO | Source: Ambulatory Visit | Attending: Cardiology | Admitting: Cardiology

## 2019-04-08 ENCOUNTER — Other Ambulatory Visit: Payer: Self-pay

## 2019-04-08 DIAGNOSIS — Z952 Presence of prosthetic heart valve: Secondary | ICD-10-CM

## 2019-04-10 ENCOUNTER — Other Ambulatory Visit: Payer: Self-pay

## 2019-04-10 ENCOUNTER — Encounter (HOSPITAL_COMMUNITY)
Admission: RE | Admit: 2019-04-10 | Discharge: 2019-04-10 | Disposition: A | Discharge status: Home / Self Care | Payer: Medicare PPO | Source: Ambulatory Visit | Attending: Cardiology | Admitting: Cardiology

## 2019-04-10 DIAGNOSIS — Z952 Presence of prosthetic heart valve: Secondary | ICD-10-CM | POA: Diagnosis not present

## 2019-04-12 ENCOUNTER — Other Ambulatory Visit: Payer: Self-pay

## 2019-04-12 ENCOUNTER — Encounter (HOSPITAL_COMMUNITY)
Admission: RE | Admit: 2019-04-12 | Discharge: 2019-04-12 | Disposition: A | Discharge status: Home / Self Care | Payer: Medicare PPO | Source: Ambulatory Visit | Attending: Cardiology | Admitting: Cardiology

## 2019-04-12 DIAGNOSIS — Z952 Presence of prosthetic heart valve: Secondary | ICD-10-CM | POA: Diagnosis not present

## 2019-04-15 ENCOUNTER — Encounter (HOSPITAL_COMMUNITY)
Admission: RE | Admit: 2019-04-15 | Discharge: 2019-04-15 | Disposition: A | Discharge status: Home / Self Care | Payer: Medicare PPO | Source: Ambulatory Visit | Attending: Cardiology | Admitting: Cardiology

## 2019-04-15 ENCOUNTER — Other Ambulatory Visit: Payer: Self-pay

## 2019-04-15 DIAGNOSIS — Z952 Presence of prosthetic heart valve: Secondary | ICD-10-CM

## 2019-04-17 ENCOUNTER — Encounter (HOSPITAL_COMMUNITY)
Admission: RE | Admit: 2019-04-17 | Discharge: 2019-04-17 | Disposition: A | Discharge status: Home / Self Care | Payer: Medicare PPO | Source: Ambulatory Visit | Attending: Cardiology | Admitting: Cardiology

## 2019-04-17 ENCOUNTER — Other Ambulatory Visit: Payer: Self-pay

## 2019-04-17 VITALS — Ht 63.0 in | Wt 166.2 lb

## 2019-04-17 DIAGNOSIS — Z952 Presence of prosthetic heart valve: Secondary | ICD-10-CM | POA: Diagnosis not present

## 2019-04-19 ENCOUNTER — Other Ambulatory Visit: Payer: Self-pay

## 2019-04-19 ENCOUNTER — Encounter (HOSPITAL_COMMUNITY)
Admission: RE | Admit: 2019-04-19 | Discharge: 2019-04-19 | Disposition: A | Discharge status: Home / Self Care | Payer: Medicare PPO | Source: Ambulatory Visit | Attending: Cardiology | Admitting: Cardiology

## 2019-04-19 DIAGNOSIS — N189 Chronic kidney disease, unspecified: Secondary | ICD-10-CM | POA: Insufficient documentation

## 2019-04-19 DIAGNOSIS — E1142 Type 2 diabetes mellitus with diabetic polyneuropathy: Secondary | ICD-10-CM | POA: Diagnosis not present

## 2019-04-19 DIAGNOSIS — Z952 Presence of prosthetic heart valve: Secondary | ICD-10-CM | POA: Insufficient documentation

## 2019-04-19 DIAGNOSIS — I2510 Atherosclerotic heart disease of native coronary artery without angina pectoris: Secondary | ICD-10-CM | POA: Diagnosis not present

## 2019-04-19 DIAGNOSIS — Z7982 Long term (current) use of aspirin: Secondary | ICD-10-CM | POA: Insufficient documentation

## 2019-04-19 DIAGNOSIS — Z79899 Other long term (current) drug therapy: Secondary | ICD-10-CM | POA: Diagnosis not present

## 2019-04-19 DIAGNOSIS — E11319 Type 2 diabetes mellitus with unspecified diabetic retinopathy without macular edema: Secondary | ICD-10-CM | POA: Diagnosis not present

## 2019-04-19 DIAGNOSIS — E1122 Type 2 diabetes mellitus with diabetic chronic kidney disease: Secondary | ICD-10-CM | POA: Insufficient documentation

## 2019-04-19 DIAGNOSIS — I129 Hypertensive chronic kidney disease with stage 1 through stage 4 chronic kidney disease, or unspecified chronic kidney disease: Secondary | ICD-10-CM | POA: Insufficient documentation

## 2019-04-19 DIAGNOSIS — E785 Hyperlipidemia, unspecified: Secondary | ICD-10-CM | POA: Insufficient documentation

## 2019-04-19 DIAGNOSIS — Z794 Long term (current) use of insulin: Secondary | ICD-10-CM | POA: Insufficient documentation

## 2019-04-22 ENCOUNTER — Encounter (HOSPITAL_COMMUNITY): Payer: Medicare PPO

## 2019-04-22 ENCOUNTER — Telehealth (HOSPITAL_COMMUNITY): Payer: Self-pay | Admitting: Internal Medicine

## 2019-04-23 ENCOUNTER — Other Ambulatory Visit: Payer: Self-pay

## 2019-04-23 ENCOUNTER — Ambulatory Visit
Admission: RE | Admit: 2019-04-23 | Discharge: 2019-04-23 | Disposition: A | Discharge status: Home / Self Care | Payer: Medicare PPO | Source: Ambulatory Visit | Attending: Internal Medicine | Admitting: Internal Medicine

## 2019-04-23 ENCOUNTER — Ambulatory Visit: Payer: Medicare PPO | Admitting: Cardiology

## 2019-04-23 ENCOUNTER — Other Ambulatory Visit: Payer: Self-pay | Admitting: Internal Medicine

## 2019-04-23 DIAGNOSIS — J209 Acute bronchitis, unspecified: Secondary | ICD-10-CM

## 2019-04-24 ENCOUNTER — Telehealth (HOSPITAL_COMMUNITY): Payer: Self-pay | Admitting: *Deleted

## 2019-04-24 ENCOUNTER — Encounter (HOSPITAL_COMMUNITY): Payer: Medicare PPO

## 2019-04-24 NOTE — Telephone Encounter (Signed)
Ms Gamboa came in to let the department know she will not be returning to exercise at cardiac rehab. Patient turned parking badge in. Temp 97.7.Barnet Pall, RN,BSN 04/24/2019 10:38 AM

## 2019-04-26 ENCOUNTER — Ambulatory Visit: Payer: Medicare PPO | Admitting: Cardiology

## 2019-04-26 ENCOUNTER — Encounter (HOSPITAL_COMMUNITY): Payer: Medicare PPO

## 2019-05-03 ENCOUNTER — Encounter: Payer: Self-pay | Admitting: Cardiology

## 2019-05-08 ENCOUNTER — Encounter (HOSPITAL_COMMUNITY): Payer: Self-pay | Admitting: *Deleted

## 2019-05-08 DIAGNOSIS — Z952 Presence of prosthetic heart valve: Secondary | ICD-10-CM

## 2019-05-08 NOTE — Progress Notes (Addendum)
Discharge Progress Report  Patient Details  Name: [name] MRN: [id number] Date of Birth: Nov 19, 1940 Referring Provider:     [place] from 01/22/2019 in [place]  Referring Provider  [place]. 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  ADL UCSD:   Initial Exercise Prescription:   Discharge Exercise Prescription (Final Exercise Prescription Changes): Exercise Prescription Changes - 04/19/19 1410      Response to Exercise   Blood Pressure (Admit)  142/70    Blood Pressure (Exercise)  148/56    Blood Pressure (Exit)  120/58    Heart Rate (Admit)  65 bpm    Heart Rate (Exercise)  99 bpm    Heart Rate (Exit)  65 bpm    Rating of Perceived Exertion (Exercise)  13    Perceived Dyspnea (Exercise)  0    Symptoms  None    Comments  Pt's last day of exercise    Duration  Continue with 30 min of aerobic exercise without signs/symptoms of physical distress.    Intensity  THRR unchanged      Progression   Progression  Continue to progress workloads to maintain intensity without signs/symptoms of physical distress.    Average METs  2.5      Resistance Training   Training Prescription  Yes    Weight  5lbs    Reps  10-15    Time  10 Minutes      Interval Training   Interval Training  No      NuStep   Level  5    SPM  85    Minutes  15    METs  3.1      Arm Ergometer   Level  2    Minutes  15    METs  2      Home Exercise Plan   Plans to continue exercise at  Home (comment)   Walking   Frequency  Add 3 additional days to program exercise sessions.    Initial Home Exercises Provided  01/22/19       Functional Capacity: 6 Minute Walk    Row Name 04/17/19 1031  04/24/19 1419       6 Minute Walk   Phase  Discharge  Initial    Distance  1522 feet  --    Distance % Change  8.71 %  --    Distance Feet Change  122 ft  --    Walk Time  6 minutes  --    # of Rest Breaks  0  --    MPH  2.88  --    METS  2.68  --    RPE  12  --    Perceived Dyspnea   0  --    VO2 Peak  9.36  --    Symptoms  No  --    Resting HR  68 bpm  --    Resting BP  118/70  --    Resting Oxygen Saturation   100 %  --    Exercise Oxygen Saturation  during 6 min walk  100 %  --    Max Ex. HR  90 bpm  --    Max Ex. BP  162/56  --    2 Minute Post BP  116/42  --       Psychological, QOL, Others - Outcomes: PHQ 2/9: Depression screen [facility] 2/9 01/22/2019 09/15/2015  Decreased Interest 0 0  Down, Depressed, Hopeless 0 0  PHQ - 2 Score 0 0  Some recent data might be hidden    Quality of Life: Quality of Life - 04/19/19 1435      Quality of Life   Select  Quality of Life      Quality of Life Scores   Health/Function Pre  23.67 %    Health/Function Post  24.4 %    Health/Function % Change  3.08 %    Socioeconomic Pre  27.5 %    Socioeconomic Post  28.21 %    Socioeconomic % Change   2.58 %    Psych/Spiritual Pre  26.57 %    Psych/Spiritual Post  25.71 %    Psych/Spiritual % Change  -3.24 %    Family Pre  23.5 %    Family Post  27.6 %    Family % Change  17.45 %    GLOBAL Pre  25.03 %    GLOBAL Post  25.93 %    GLOBAL % Change  3.6 %       Personal Goals: Goals established at orientation with interventions provided to work toward goal.    Personal Goals Discharge: Goals and Risk Factor Review    Row Name 03/22/19 1201 04/19/19 1645 04/25/19 1532         Core Components/Risk Factors/Patient Goals Review   Personal Goals Review  Weight Management/Obesity;Diabetes;Hypertension;Lipids  Weight Management/Obesity;Diabetes;Hypertension;Lipids  --     Review  Patient with multiple CAD risk factors.  [name] continues to tolerate exerise well.  Patient with multiple  CAD risk factors.  [name] continues to tolerate exerise well.  She will graduate on 04/26/19.  [name] has graduated from the [employer] with the 15 compled sessions.  She graduated a couple of sessions early dt/t an illness.  She tolerated exercise well and enjoyed participating in the CR program.     Expected Outcomes  Patient will continue to participate in cardiac rehab for risk factor modification.  Patient will continue to participate in cardiac rehab for risk factor modification.  Patient will continue to participate in exercise, nutrition, and lifestyle modification opportunities to reduce risk of CV disease.  She plans to walk for exercise.        Exercise Goals and Review:   Exercise Goals Re-Evaluation: Exercise Goals Re-Evaluation    Row Name 03/25/19 0900 04/24/19 1420 04/24/19 1424         Exercise Goal Re-Evaluation   Exercise Goals Review  Increase Physical Activity;Increase Strength and Stamina;Able to understand and use rate of perceived exertion (RPE) scale;Knowledge and understanding of Target Heart Rate Range (THRR);Able to check pulse independently;Understanding of Exercise Prescription  Increase Physical Activity;Increase Strength and Stamina;Able to understand and use rate of perceived exertion (RPE) scale;Knowledge and understanding of Target Heart Rate Range (THRR);Able to check pulse independently;Understanding of Exercise Prescription  --     Comments  Reviewed HEP with Pt. Pt is currently wlkaing at home 5-10 minutes once per day. Encouraged Pt to increase her time or walk 10 minutes 2x per day 2-4 days per week. Pt expressed understanding. Spoke with Pt about THRR, RPE scale, end points of exercise, and weather precautions, Pt understands  goals.  Pt completed 15 sessions of Cardiac Rehab. Pt increased functional capacity by 8.71%. Pt increased muscular strength and was able to use 5lb free weights. Pt states she is grateful for program.  Pt completed 15 sessions of Cardiac  Rehab. Pt increased functional capacity by 8.71%. Pt increased muscular strength and was able to use 5lb free weights. Pt states she is grateful for program and states she accomplished her goal of increasing her activity level.     Expected Outcomes  Will continue to monitor and progress Pt as tolerated.  Pt will continue to exercise by walking 3-4 days a week for 30 minutes.  --        Nutrition & Weight - Outcomes:  Post Biometrics - 04/17/19 1032       Post  Biometrics   Height  5\' 3"  (1.6 m)    Weight  166 lb 3.6 oz (75.4 kg)    Waist Circumference  37.5 inches    Hip Circumference  41.75 inches    Waist to Hip Ratio  0.9 %    BMI (Calculated)  29.45    Triceps Skinfold  37 mm    % Body Fat  43.4 %    Grip Strength  25 kg    Flexibility  0 in    Single Leg Stand  9.65 seconds       Nutrition: Nutrition Therapy & Goals - 03/15/19 0618      Nutrition Therapy   Diet  Heart healthy/CKD/Carb modified    Drug/Food Interactions  Statins/Certain Fruits      Personal Nutrition Goals   Nutrition Goal  Pt to build a healthy plate including vegetables, fruits, whole grains, and low-fat dairy products in a heart healthy meal plan.    Personal Goal #2  Pt to continue limiting sodium, phosphorus, and potassium rich foods    Personal Goal #3  CBG concentrations in the normal range or as close to normal as is safely possible.      Intervention Plan   Intervention  Prescribe, educate and counsel regarding individualized specific dietary modifications aiming towards targeted core components such as weight, hypertension, lipid management, diabetes, heart failure and other comorbidities.;Nutrition handout(s) given to patient.    Expected Outcomes  Short Term Goal: A plan has been developed with personal nutrition goals set during dietitian appointment.       Nutrition Discharge: Nutrition Assessments - 04/29/19 1218      MEDFICTS Scores   Pre Score  --   Assessment done virtually during  March closure - did not return pre survey   Post Score  9       Education Questionnaire Score: Knowledge Questionnaire Score - 04/19/19 1435      Knowledge Questionnaire Score   Pre Score  22/24    Post Score  24/24       Patient attended 15 sessions between 03/06/19-04/19/19. [name] did well with exercise when in attendance. [name] plans on using light hands weights at home.Barnet Pall, RN,BSN 05/08/2019 2:37 PM

## 2019-05-12 ENCOUNTER — Other Ambulatory Visit: Payer: Self-pay | Admitting: Physician Assistant

## 2019-05-13 ENCOUNTER — Ambulatory Visit: Payer: Medicare PPO | Admitting: Cardiology

## 2019-05-28 DIAGNOSIS — E113512 Type 2 diabetes mellitus with proliferative diabetic retinopathy with macular edema, left eye: Secondary | ICD-10-CM | POA: Insufficient documentation

## 2019-05-28 DIAGNOSIS — H348122 Central retinal vein occlusion, left eye, stable: Secondary | ICD-10-CM | POA: Insufficient documentation

## 2019-05-28 DIAGNOSIS — H34831 Tributary (branch) retinal vein occlusion, right eye, with macular edema: Secondary | ICD-10-CM | POA: Insufficient documentation

## 2019-05-29 ENCOUNTER — Encounter (INDEPENDENT_AMBULATORY_CARE_PROVIDER_SITE_OTHER): Payer: Self-pay | Admitting: Ophthalmology

## 2019-05-29 ENCOUNTER — Other Ambulatory Visit: Payer: Self-pay

## 2019-05-29 ENCOUNTER — Ambulatory Visit (INDEPENDENT_AMBULATORY_CARE_PROVIDER_SITE_OTHER): Payer: Medicare PPO | Admitting: Ophthalmology

## 2019-05-29 DIAGNOSIS — H34812 Central retinal vein occlusion, left eye, with macular edema: Secondary | ICD-10-CM

## 2019-05-29 DIAGNOSIS — E113512 Type 2 diabetes mellitus with proliferative diabetic retinopathy with macular edema, left eye: Secondary | ICD-10-CM | POA: Diagnosis not present

## 2019-05-29 DIAGNOSIS — H348122 Central retinal vein occlusion, left eye, stable: Secondary | ICD-10-CM | POA: Insufficient documentation

## 2019-05-29 DIAGNOSIS — H34831 Tributary (branch) retinal vein occlusion, right eye, with macular edema: Secondary | ICD-10-CM

## 2019-05-29 MED ORDER — BEVACIZUMAB CHEMO INJECTION 1.25MG/0.05ML SYRINGE FOR KALEIDOSCOPE
1.2500 mg | INTRAVITREAL | Status: AC | PRN
  Administered 2019-05-29: 1.25 mg via INTRAVITREAL

## 2019-05-29 NOTE — Assessment & Plan Note (Signed)
OS with worsening CME recurrent, will need to resume intravitreal Avastin OS today and examination return in 6 weeks possible intravitreal Avastin

## 2019-05-29 NOTE — Progress Notes (Signed)
Follow up visit  Subjective:   [name], female    DOB: 1940-08-20, 79 y.o.   MRN: [id number]    HPI   Chief Complaint  Patient presents with  . s/p  tavr  . Chest Pain  . Hypertension  . Follow-up    3 month    79 y.o. African American female with hypertension, hyperlipidemia, diabetes with CKD stage III/IV, CAD, h/o strokes (2015, 2020), s/p TAVR for severe AS (11/2018)  Yesterday, patient had one episode of sharp pain under her left breast, at rest, lasting for few seconds, resolved with nitroglycerin.  She has not experienced any other episodes of chest pain while walking.  Blood pressure is elevated today, she reports not having had her meds this morning.  Current Outpatient Medications on File Prior to Visit  Medication Sig Dispense Refill  . acetaminophen (TYLENOL) 325 MG tablet Take 650 mg by mouth every 6 (six) hours as needed for moderate pain or headache.     . allopurinol (ZYLOPRIM) 100 MG tablet Take 50 mg by mouth daily.    Marland Kitchen amLODipine (NORVASC) 10 MG tablet Take 1 tablet by mouth every day (Patient taking differently: Take 10 mg by mouth daily. ) 30 tablet 11  . B-D INS SYR ULTRAFINE 1CC/30G 30G X 1/2" 1 ML MISC Inject 1 each into the skin 3 (three) times daily between meals.    . benzonatate (TESSALON) 100 MG capsule Take 100 mg by mouth 3 (three) times daily as needed for cough.    . brimonidine (ALPHAGAN) 0.2 % ophthalmic solution     . calcitRIOL (ROCALTROL) 0.25 MCG capsule Take 0.25 mcg by mouth daily.     . cetirizine (ZYRTEC) 10 MG tablet Take 10 mg by mouth daily.    . clopidogrel (PLAVIX) 75 MG tablet TAKE 1 TABLET BY MOUTH EVERY DAY 90 tablet 1  . Desvenlafaxine ER 100 MG TB24 Take 100 mg by mouth daily.    . fluticasone (FLONASE) 50 MCG/ACT nasal spray Place 2 sprays into both nostrils daily.    . folic acid (FOLVITE) 1 MG tablet Take 1 mg by mouth daily.    Marland Kitchen HUMALOG KWIKPEN 100 UNIT/ML KwikPen Inject 2-8 Units into the skin 3 (three) times  daily before meals.     . insulin glargine (LANTUS) 100 UNIT/ML injection Inject 16 Units into the skin at bedtime.     Marland Kitchen labetalol (NORMODYNE) 100 MG tablet Take 100 mg by mouth 2 (two) times daily.    Marland Kitchen leflunomide (ARAVA) 10 MG tablet Take 10 mg by mouth daily.    Marland Kitchen lisinopril (ZESTRIL) 5 MG tablet Take 5 mg by mouth daily.    . niacin (NIASPAN) 500 MG CR tablet Take 500 mg by mouth daily.     . nitroGLYCERIN (NITROSTAT) 0.4 MG SL tablet PLACE 1 TABLET (0.4 MG TOTAL) UNDER THE TONGUE EVERY 5 MINUTES X 3 DOSES AS NEEDED FOR CHEST PAIN. 25 tablet 1  . nortriptyline (PAMELOR) 10 MG capsule Take 10 mg by mouth daily.    . ONE TOUCH ULTRA TEST test strip 1 each by Other route 3 (three) times daily between meals.    . polyvinyl alcohol (TEARS AGAIN) 1.4 % ophthalmic solution Place 1 drop into both eyes 3 (three) times daily as needed for dry eyes.    Marland Kitchen PRISTIQ 100 MG 24 hr tablet Take 100 mg by mouth daily.     . rosuvastatin (CRESTOR) 20 MG tablet Take 20 mg by mouth  daily.    . torsemide (DEMADEX) 20 MG tablet Take 1 tablet (20 mg total) by mouth daily. May take additional dose in the afternoon for swelling, shortness of breath or weight gain. 180 tablet 0   No current facility-administered medications on file prior to visit.    Cardiovascular & other pertient studies:  EKG 05/31/2019: Sinus rhythm 65 bpm Normal EKG   Echocardiogram 12/11/2018: Left ventricle cavity is normal in size. Moderate concentric hypertrophy of the left ventricle. Normal LV systolic function with EF 55%. Normal global wall motion. Doppler evidence of grade II (pseudonormal) diastolic dysfunction, elevated LAP.  Left atrial cavity is moderately dilated. Well seated Wende Crease Sapien 3 bioprosthetic aortic valve with normal functioning. mean PG 12 mmHg. No valvular regurgitation or paravalvular leak. Mild to moderate mitral regurgitation. Mild to moderate tricuspid regurgitation. Estimated pulmonary artery  systolic pressure is 32 mmHg. Compared to previous study on 11/19/2018, estimated PASP down from 40 mmHg to 32 mmHg.  Right and left heart cath 10/23/18:  RA: 4 mmHg RV: 54/9 mmHg PA: 54/17 mmHg, mean PAP 34 mmhg. PCWP: 25 mmHg with tall V wave  CO: 5.2 L/min CI: 3 L/min/m2  LM: Normal LAD: Normal LCx:: Normal RCA: Ostial calcific at least 60% stenosis. Ostial RPDA 20% stenosis.   Right coronary artery was difficult to engage. I had to use multiple catheters, and was finally able to engage with [facility] catheter. there was dampnening of the pressure with catheter engagement. Ostium is at least 60% stenosed with moderate calcification. There was no signifciant improvement with IC NTG. I did not perform FFR in order to limit contrast use in this patient with Cr of 3.0, not on dialysis.  MRA of head 09/10/2018:  1. Acute infarct right occipital lobe. No associated hemorrhage in the infarct. Chronic microhemorrhage right parietal white matter appears separate. 2. Right M1 stent obscures evaluation of the right middle cerebral artery on MRA 3. Right posterior cerebral artery patent. 4. Occluded distal P3 branch on the left.   Carotid artery duplex 12/04/2013: No evidence of hemodynamically significant stenosis in the bilateral carotid bifurcation vessels. There is evidence of minimal heterogeneous plaque in the bilateral carotid artery.  Recent labs: 05/03/2019:  Glucose 252, BUN/Cr 51/2.69. EGFR 19. Na/K 138/4.2. Rest of the CMP normal. H/H 10.7/33. MCV 90. Platelets 261  Chol 145, TG 154, HDL 45, LDL 74.  NTproBNP 615.   Uric Acid 8.3 (H)  11/09/2018: HbA1C 7.6%  03/06/2018: TSH 1.82 normal   Review of Systems  Cardiovascular: Positive for chest pain. Negative for dyspnea on exertion, leg swelling, palpitations and syncope.        Vitals:   05/31/19 0938 05/31/19 0945  BP: (!) 182/63 (!) 188/63  Pulse: 64 68  Resp: 16   Temp: 97.6 F (36.4 C)   SpO2: 100%      Body mass index is 30.29 kg/m. Filed Weights   05/31/19 0938  Weight: 171 lb (77.6 kg)     Objective:   Physical Exam  Constitutional: No distress.  Mildly obese  Neck: No JVD present.  Cardiovascular: Normal rate, regular rhythm and intact distal pulses.  Murmur heard.  Systolic murmur is present with a grade of 2/6 at the upper right sternal border radiating to the neck. Pulses:      Carotid pulses are on the right side with bruit and on the left side with bruit.      Femoral pulses are 2+ on the right side and 2+ on  the left side.      Popliteal pulses are 1+ on the right side and 1+ on the left side.       Dorsalis pedis pulses are 2+ on the right side and 2+ on the left side.       Posterior tibial pulses are 2+ on the right side and 2+ on the left side.  Left upper arm AV fistula present   Pulmonary/Chest: Effort normal and breath sounds normal. She has no wheezes. She has no rales.  Musculoskeletal:        General: No edema.  Nursing note and vitals reviewed.         Assessment & Recommendations:   80 y.o. African American female with hypertension, hyperlipidemia, diabetes with CKD stage III/IV, CAD, h/o strokes (2015, 2020), s/p TAVR for severe AS (11/2018)  Chest pain: Atypical.  She does have moderate ostial RCA and ostial PDA disease noted on coronary angiogram prior to TAVR. Will obtain Lexiscan/walking nuclear stress test to evaluate for ischemia. Continue aggressive risk factor modification in the meantime. Continue Plavix, statin.   Hypertension: Uncontrolled.  Switch to hydralazine 25 mg 3 times daily to BiDil 20-37.5 mg 3 times daily. Recommend remote patient monitoring.  S/P TAVR (transcatheter aortic valve replacement): Normal functioning bioprosthetic aortic valve  Chronic kidney disease (CKD), stage IV: Follow-up with nephrology nurse this week this  Nigel Mormon, MD [facility]. [place] Pager: [phone] Office:  [phone]

## 2019-05-29 NOTE — Progress Notes (Signed)
05/29/2019     CHIEF COMPLAINT Patient presents for Retina Follow Up   HISTORY OF PRESENT ILLNESS: [name] is a 79 y.o. female who presents to the clinic today for:   HPI    Retina Follow Up    Patient presents with  CRVO/BRVO.  In right eye.  This started 6 months ago.  Severity is mild.  Duration of 6 months.  Since onset it is stable.          Comments    6 Month BRVO F/U OD  Pt reports stable VA OU since last visit. Pt reports intermittent ache OS, but sts it's not steady. Pt sts she is now using brimonidine BID OS. Pt denies flashes or floaters OU. Pt sts she uses ATs OD PRN.       Last edited by Rockie Neighbours, [place] on 05/29/2019  8:30 AM. (History)      Referring physician: Audley Hose, MD [place],   [id number]  HISTORICAL INFORMATION:   Selected notes from the MEDICAL RECORD NUMBER    Lab Results  Component Value Date   HGBA1C 7.6 (H) 11/09/2018     CURRENT MEDICATIONS: Current Outpatient Medications (Ophthalmic Drugs)  Medication Sig  . brimonidine (ALPHAGAN) 0.2 % ophthalmic solution   . polyvinyl alcohol (TEARS AGAIN) 1.4 % ophthalmic solution Place 1 drop into both eyes 3 (three) times daily as needed for dry eyes.   No current facility-administered medications for this visit. (Ophthalmic Drugs)   Current Outpatient Medications (Other)  Medication Sig  . acetaminophen (TYLENOL) 325 MG tablet Take 650 mg by mouth every 6 (six) hours as needed for moderate pain or headache.   . allopurinol (ZYLOPRIM) 100 MG tablet Take 50 mg by mouth daily.  Marland Kitchen amLODipine (NORVASC) 10 MG tablet Take 1 tablet by mouth every day (Patient taking differently: Take 10 mg by mouth daily. )  . B-D INS SYR ULTRAFINE 1CC/30G 30G X 1/2" 1 ML MISC Inject 1 each into the skin 3 (three) times daily between meals.  . benzonatate (TESSALON) 100 MG capsule Take 100 mg by mouth 3 (three) times daily as needed for cough.  . calcitRIOL  (ROCALTROL) 0.25 MCG capsule Take 0.25 mcg by mouth daily.   . cetirizine (ZYRTEC) 10 MG tablet Take 10 mg by mouth daily.  . clopidogrel (PLAVIX) 75 MG tablet TAKE 1 TABLET BY MOUTH EVERY DAY  . Desvenlafaxine ER 100 MG TB24 Take 100 mg by mouth daily.  . fluticasone (FLONASE) 50 MCG/ACT nasal spray Place 2 sprays into both nostrils daily.  . folic acid (FOLVITE) 1 MG tablet Take 1 mg by mouth daily.  Marland Kitchen HUMALOG KWIKPEN 100 UNIT/ML KwikPen Inject 2-8 Units into the skin 3 (three) times daily before meals.   . hydrALAZINE (APRESOLINE) 25 MG tablet TAKE 1 TABLET BY MOUTH THREE TIMES A DAY  . insulin glargine (LANTUS) 100 UNIT/ML injection Inject 16 Units into the skin at bedtime.   Marland Kitchen labetalol (NORMODYNE) 100 MG tablet Take 100 mg by mouth 2 (two) times daily.  Marland Kitchen leflunomide (ARAVA) 10 MG tablet Take 10 mg by mouth daily.  Marland Kitchen lisinopril (ZESTRIL) 5 MG tablet Take 5 mg by mouth daily.  . niacin (NIASPAN) 500 MG CR tablet Take 500 mg by mouth daily.   . nitroGLYCERIN (NITROSTAT) 0.4 MG SL tablet PLACE 1 TABLET (0.4 MG TOTAL) UNDER THE TONGUE EVERY 5 MINUTES X 3 DOSES AS NEEDED FOR CHEST PAIN.  Marland Kitchen  nortriptyline (PAMELOR) 10 MG capsule Take 10 mg by mouth daily.  . ONE TOUCH ULTRA TEST test strip 1 each by Other route 3 (three) times daily between meals.  Marland Kitchen PRISTIQ 100 MG 24 hr tablet Take 100 mg by mouth daily.   . rosuvastatin (CRESTOR) 20 MG tablet Take 20 mg by mouth daily.  Marland Kitchen torsemide (DEMADEX) 20 MG tablet Take 1 tablet (20 mg total) by mouth daily. May take additional dose in the afternoon for swelling, shortness of breath or weight gain.   No current facility-administered medications for this visit. (Other)      REVIEW OF SYSTEMS:    ALLERGIES No Known Allergies  PAST MEDICAL HISTORY Past Medical History:  Diagnosis Date  . Arthritis   . Cerebrovascular disease   . CKD (chronic kidney disease)    Sees Dr Hollie Salk  . Coronary artery disease   . Depression   . Diabetes ([place])     INSULIN DEPENDENT  . Diabetic peripheral neuropathy ([place])   . Diabetic retinopathy ([place])   . Diverticulitis   . GERD (gastroesophageal reflux disease)   . History of CVA (cerebrovascular accident)    x 2 no residulal  . Hyperlipidemia   . Hypertension   . Obesity   . Renal lesion   . S/P TAVR (transcatheter aortic valve replacement) 11/13/2018   s/p TAVR with a 13mm Edwards S3U via the TF approach by Dr. [name] and Dr. Angelena Form  . Severe aortic stenosis    Past Surgical History:  Procedure Laterality Date  . ABDOMINAL HYSTERECTOMY    . AV FISTULA PLACEMENT Left 12/18/2017   Procedure: ARTERIOVENOUS (AV) FISTULA CREATION ARM;  Surgeon: Marty Heck, MD;  Location: [facility];  Service: Vascular;  Laterality: Left;  . bilateral cataract surgery    . CARDIAC CATHETERIZATION  01/07/2014   DR [place] RESECTION  02/1999  . COLONOSCOPY    . COLOSTOMY  02/1999  . COLOSTOMY CLOSURE  07/1999  . EYE SURGERY Left 2019  . FISTULA SUPERFICIALIZATION Left 06/29/2018   Procedure: FISTULA SUPERFICIALIZATION LEFT BRACHIOCEPHALIC;  Surgeon: Marty Heck, MD;  Location: [facility];  Service: Vascular;  Laterality: Left;  . LEFT HEART CATHETERIZATION WITH CORONARY ANGIOGRAM N/A 01/07/2014   Procedure: LEFT HEART CATHETERIZATION WITH CORONARY ANGIOGRAM;  Surgeon: Laverda Page, MD;  Location: [facility];  Service: Cardiovascular;  Laterality: N/A;  . middle cerebral artery stent placement Right   . OTHER SURGICAL HISTORY     laser surgery  . PTCA  01/07/2014   DES to RCA    DR Einar Gip  . right knee surgery Right    for infection  . RIGHT/LEFT HEART CATH AND CORONARY ANGIOGRAPHY N/A 10/23/2018   Procedure: RIGHT/LEFT HEART CATH AND CORONARY ANGIOGRAPHY;  Surgeon: Nigel Mormon, MD;  Location: [facility];  Service: Cardiovascular;  Laterality: N/A;  . TEE WITHOUT CARDIOVERSION N/A 11/13/2018   Procedure: TRANSESOPHAGEAL ECHOCARDIOGRAM (TEE);  Surgeon: Burnell Blanks, MD;  Location: [facility];  Service: Open Heart Surgery;  Laterality: N/A;  . TRANSCATHETER AORTIC VALVE REPLACEMENT, TRANSFEMORAL  11/13/2018  . TRANSCATHETER AORTIC VALVE REPLACEMENT, TRANSFEMORAL N/A 11/13/2018   Procedure: TRANSCATHETER AORTIC VALVE REPLACEMENT, TRANSFEMORAL;  Surgeon: Burnell Blanks, MD;  Location: [facility];  Service: Open Heart Surgery;  Laterality: N/A;    FAMILY HISTORY Family History  Problem Relation Age of Onset  . Diabetes Mother   . Heart disease Mother   . Prostate cancer Father   .  Hypertension Brother   . Prostate cancer Brother     SOCIAL HISTORY Social History   Tobacco Use  . Smoking status: Never Smoker  . Smokeless tobacco: Never Used  Substance Use Topics  . Alcohol use: No  . Drug use: No         OPHTHALMIC EXAM:  Base Eye Exam    Visual Acuity (ETDRS)      Right Left   Dist [place] 20/25 +2 CF at 3'   Dist ph [place]  20/400       Tonometry (Tonopen, 8:34 AM)      Right Left   Pressure 19 18       Pupils      Dark Light Shape React APD   Right 4 3 Round Brisk None   Left 5 5 Irregular Minimal None       Visual Fields (Counting fingers)      Left Right   Restrictions Total superior nasal deficiency Total superior temporal, inferior temporal deficiencies       Extraocular Movement      Right Left    Full Full       Neuro/Psych    Oriented x3: Yes   Mood/Affect: Normal       Dilation    Right eye: 1.0% Mydriacyl, 2.5% Phenylephrine @ 8:34 AM        Slit Lamp and Fundus Exam    External Exam      Right Left   External Normal Normal       Slit Lamp Exam      Right Left   Lids/Lashes Normal Normal   Conjunctiva/Sclera White and quiet White and quiet   Cornea Clear Clear   Anterior Chamber Deep and quiet Deep and quiet   Iris Round and reactive Round and reactive   Anterior Vitreous Normal Normal       Fundus Exam      Right Left   C/D Ratio 0.5 0.6   Macula  Cystoid macular edema, no exudates,  Macular thickening, Microaneurysms          IMAGING AND PROCEDURES  Imaging and Procedures for 05/29/19  OCT, Retina - OU - Both Eyes       Right Eye Quality was good. Scan locations included subfoveal. Central Foveal Thickness: 259. Progression has been stable. Findings include normal observations.   Left Eye Quality was good. Scan locations included subfoveal. Central Foveal Thickness: 284. Progression has worsened. Findings include abnormal foveal contour, cystoid macular edema.   Notes Focal CME, NASAL TO FAZ, may be old shunt of prior longstanding, CRVO. OS       Intravitreal Injection, Pharmacologic Agent - OS - Left Eye       Time Out 05/29/2019. 9:09 AM. Confirmed correct patient, procedure, site, and patient consented.   Anesthesia Topical anesthesia was used. Anesthetic medications included [place] 3.5%.   Procedure Preparation included 5% betadine to ocular surface.   Injection:  1.25 mg Bevacizumab (AVASTIN) SOLN   NDC: [address]-4, Lot: [id number]   Route: Intravitreal, Site: Left Eye, Waste: 0 mg  Post-op Post injection exam found visual acuity of at least counting fingers. The patient tolerated the procedure well. There were no complications. The patient received written and verbal post procedure care education. Post injection medications were not given.                 ASSESSMENT/PLAN:  Central retinal vein occlusion, left eye, with macular edema OS with worsening CME  recurrent, will need to resume intravitreal Avastin OS today and examination return in 6 weeks possible intravitreal Avastin      ICD-10-CM   1. Central retinal vein occlusion with macular edema of left eye  H34.8120 OCT, Retina - OU - Both Eyes    Intravitreal Injection, Pharmacologic Agent - OS - Left Eye    Bevacizumab (AVASTIN) SOLN 1.25 mg  2. Proliferative diabetic retinopathy of left eye with macular edema associated with type 2 diabetes mellitus ([place])  T90.3009   3.  Central retinal vein occlusion, left eye, with macular edema  H34.8120     1.OS with worsening CME recurrent, will need to resume intravitreal Avastin OS today and examination return in 6 weeks possible intravitreal Avastin  2.  OD, remained stable  3.  Ophthalmic Meds Ordered this visit:  Meds ordered this encounter  Medications  . Bevacizumab (AVASTIN) SOLN 1.25 mg       Return in about 6 weeks (around 07/10/2019) for AVASTIN OCT, OS.  There are no Patient Instructions on file for this visit.   Explained the diagnoses, plan, and follow up with the patient and they expressed understanding.  Patient expressed understanding of the importance of proper follow up care.   Clent Demark Raygen Linquist [name] Diseases & Surgery of the Retina and Vitreous Retina & Diabetic [place] 05/29/19     Abbreviations: M myopia (nearsighted); A astigmatism; H hyperopia (farsighted); P presbyopia; Mrx spectacle prescription;  CTL contact lenses; OD right eye; OS left eye; OU both eyes  XT exotropia; ET esotropia; PEK punctate epithelial keratitis; PEE punctate epithelial erosions; DES dry eye syndrome; MGD meibomian gland dysfunction; ATs artificial tears; PFAT's preservative free artificial tears; [place] nuclear sclerotic cataract; PSC posterior subcapsular cataract; ERM epi-retinal membrane; PVD posterior vitreous detachment; RD retinal detachment; DM diabetes mellitus; DR diabetic retinopathy; NPDR non-proliferative diabetic retinopathy; PDR proliferative diabetic retinopathy; CSME clinically significant macular edema; DME diabetic macular edema; dbh dot blot hemorrhages; CWS cotton wool spot; POAG primary open angle glaucoma; C/D cup-to-disc ratio; HVF humphrey visual field; GVF goldmann visual field; OCT optical coherence tomography; IOP intraocular pressure; BRVO Branch retinal vein occlusion; CRVO central retinal vein occlusion; CRAO central retinal artery occlusion; BRAO branch retinal artery occlusion; RT  retinal tear; SB scleral buckle; PPV pars plana vitrectomy; VH Vitreous hemorrhage; PRP panretinal laser photocoagulation; IVK intravitreal kenalog; VMT vitreomacular traction; MH Macular hole;  NVD neovascularization of the disc; NVE neovascularization elsewhere; AREDS age related eye disease study; ARMD age related macular degeneration; POAG primary open angle glaucoma; EBMD epithelial/anterior basement membrane dystrophy; ACIOL anterior chamber intraocular lens; IOL intraocular lens; PCIOL posterior chamber intraocular lens; Phaco/IOL phacoemulsification with intraocular lens placement; [place] photorefractive keratectomy; LASIK laser assisted in situ keratomileusis; HTN hypertension; DM diabetes mellitus; COPD chronic obstructive pulmonary disease

## 2019-05-31 ENCOUNTER — Ambulatory Visit: Payer: Medicare PPO | Admitting: Cardiology

## 2019-05-31 ENCOUNTER — Other Ambulatory Visit: Payer: Self-pay

## 2019-05-31 ENCOUNTER — Encounter: Payer: Self-pay | Admitting: Cardiology

## 2019-05-31 VITALS — BP 188/63 | HR 68 | Temp 97.6°F | Resp 16 | Ht 63.0 in | Wt 171.0 lb

## 2019-05-31 DIAGNOSIS — N184 Chronic kidney disease, stage 4 (severe): Secondary | ICD-10-CM

## 2019-05-31 DIAGNOSIS — Z952 Presence of prosthetic heart valve: Secondary | ICD-10-CM

## 2019-05-31 DIAGNOSIS — R0789 Other chest pain: Secondary | ICD-10-CM | POA: Insufficient documentation

## 2019-05-31 DIAGNOSIS — I10 Essential (primary) hypertension: Secondary | ICD-10-CM

## 2019-05-31 DIAGNOSIS — I25118 Atherosclerotic heart disease of native coronary artery with other forms of angina pectoris: Secondary | ICD-10-CM

## 2019-05-31 MED ORDER — BIDIL 20-37.5 MG PO TABS: 1.0000 | ORAL_TABLET | Freq: Three times a day (TID) | ORAL | 1 refills | Status: DC

## 2019-06-24 ENCOUNTER — Other Ambulatory Visit: Payer: Self-pay

## 2019-06-24 ENCOUNTER — Ambulatory Visit: Payer: Medicare PPO

## 2019-06-24 DIAGNOSIS — R0789 Other chest pain: Secondary | ICD-10-CM

## 2019-06-27 ENCOUNTER — Other Ambulatory Visit: Payer: Self-pay | Admitting: Cardiology

## 2019-06-27 DIAGNOSIS — I10 Essential (primary) hypertension: Secondary | ICD-10-CM

## 2019-07-01 NOTE — Progress Notes (Addendum)
Follow up visit  Subjective:   [name], female    DOB: November 05, 1940, 79 y.o.   MRN: [id number]    HPI   Chief Complaint  Patient presents with  . TAVR  . Follow-up    4 week    79 y.o. African American female with hypertension, hyperlipidemia, diabetes with CKD stage III/IV, CAD, h/o strokes (2015, 2020), s/p TAVR for severe AS (11/2018)  Patient was ast seen a month ago for episode of chest pain. She underwent nuclear stress test that did not show ischemia. Blood pressure is relatively better controlled.   Current Outpatient Medications on File Prior to Visit  Medication Sig Dispense Refill  . acetaminophen (TYLENOL) 325 MG tablet Take 650 mg by mouth every 6 (six) hours as needed for moderate pain or headache.     . allopurinol (ZYLOPRIM) 100 MG tablet Take 50 mg by mouth daily.    Marland Kitchen amLODipine (NORVASC) 10 MG tablet Take 1 tablet by mouth every day (Patient taking differently: Take 10 mg by mouth daily. ) 30 tablet 11  . B-D INS SYR ULTRAFINE 1CC/30G 30G X 1/2" 1 ML MISC Inject 1 each into the skin 3 (three) times daily between meals.    Marland Kitchen BIDIL 20-37.5 MG tablet TAKE 1 TABLET BY MOUTH THREE TIMES A DAY 90 tablet 1  . brimonidine (ALPHAGAN) 0.2 % ophthalmic solution     . calcitRIOL (ROCALTROL) 0.25 MCG capsule Take 0.25 mcg by mouth daily.     . cetirizine (ZYRTEC) 10 MG tablet Take 10 mg by mouth daily.    . clopidogrel (PLAVIX) 75 MG tablet TAKE 1 TABLET BY MOUTH EVERY DAY 90 tablet 1  . Desvenlafaxine ER 100 MG TB24 Take 100 mg by mouth daily.    . folic acid (FOLVITE) 1 MG tablet Take 1 mg by mouth daily.    Marland Kitchen HUMALOG KWIKPEN 100 UNIT/ML KwikPen Inject 2-8 Units into the skin 3 (three) times daily before meals.     . insulin glargine (LANTUS) 100 UNIT/ML injection Inject 16 Units into the skin at bedtime.     Marland Kitchen leflunomide (ARAVA) 10 MG tablet Take 10 mg by mouth daily.    Marland Kitchen lisinopril (ZESTRIL) 5 MG tablet Take 5 mg by mouth daily.    . niacin (NIASPAN) 500 MG CR  tablet Take 500 mg by mouth daily.     . nitroGLYCERIN (NITROSTAT) 0.4 MG SL tablet PLACE 1 TABLET (0.4 MG TOTAL) UNDER THE TONGUE EVERY 5 MINUTES X 3 DOSES AS NEEDED FOR CHEST PAIN. 25 tablet 1  . nortriptyline (PAMELOR) 10 MG capsule Take 10 mg by mouth daily.    . ONE TOUCH ULTRA TEST test strip 1 each by Other route 3 (three) times daily between meals.    . polyvinyl alcohol (TEARS AGAIN) 1.4 % ophthalmic solution Place 1 drop into both eyes 3 (three) times daily as needed for dry eyes.    Marland Kitchen PRISTIQ 100 MG 24 hr tablet Take 100 mg by mouth daily.     . rosuvastatin (CRESTOR) 20 MG tablet Take 20 mg by mouth daily.    Marland Kitchen torsemide (DEMADEX) 20 MG tablet Take 1 tablet (20 mg total) by mouth daily. May take additional dose in the afternoon for swelling, shortness of breath or weight gain. 180 tablet 0   No current facility-administered medications on file prior to visit.    Cardiovascular & other pertient studies:  Lexiscan (Walking with mod Bruce)Tetrofosmin Stress Test  06/25/2019: Nondiagnostic ECG  stress.  The baseline blood pressure was 130/80 mmHg. Maximum blood pressure post injection was 200/70 mmHg, which is a hypertensive response to Lexiscan with Treadmill.  Myocardial perfusion is normal. Overall LV systolic function is normal without regional wall motion abnormalities. Stress LV EF: 61%.  No significant change from 08/06/2018. Low risk.   EKG 05/31/2019: Sinus rhythm 65 bpm Normal EKG   Echocardiogram 12/11/2018: Left ventricle cavity is normal in size. Moderate concentric hypertrophy of the left ventricle. Normal LV systolic function with EF 55%. Normal global wall motion. Doppler evidence of grade II (pseudonormal) diastolic dysfunction, elevated LAP.  Left atrial cavity is moderately dilated. Well seated Wende Crease Sapien 3 bioprosthetic aortic valve with normal functioning. mean PG 12 mmHg. No valvular regurgitation or paravalvular leak. Mild to moderate mitral  regurgitation. Mild to moderate tricuspid regurgitation. Estimated pulmonary artery systolic pressure is 32 mmHg. Compared to previous study on 11/19/2018, estimated PASP down from 40 mmHg to 32 mmHg.  Right and left heart cath 10/23/18:  RA: 4 mmHg RV: 54/9 mmHg PA: 54/17 mmHg, mean PAP 34 mmhg. PCWP: 25 mmHg with tall V wave  CO: 5.2 L/min CI: 3 L/min/m2  LM: Normal LAD: Normal LCx:: Normal RCA: Ostial calcific at least 60% stenosis. Ostial RPDA 20% stenosis.   Right coronary artery was difficult to engage. I had to use multiple catheters, and was finally able to engage with [facility] catheter. there was dampnening of the pressure with catheter engagement. Ostium is at least 60% stenosed with moderate calcification. There was no signifciant improvement with IC NTG. I did not perform FFR in order to limit contrast use in this patient with Cr of 3.0, not on dialysis.  MRA of head 09/10/2018:  1. Acute infarct right occipital lobe. No associated hemorrhage in the infarct. Chronic microhemorrhage right parietal white matter appears separate. 2. Right M1 stent obscures evaluation of the right middle cerebral artery on MRA 3. Right posterior cerebral artery patent. 4. Occluded distal P3 branch on the left.   Carotid artery duplex 12/04/2013: No evidence of hemodynamically significant stenosis in the bilateral carotid bifurcation vessels. There is evidence of minimal heterogeneous plaque in the bilateral carotid artery.  Recent labs: 05/03/2019:  Glucose 252, BUN/Cr 51/2.69. EGFR 19. Na/K 138/4.2. Rest of the CMP normal. H/H 10.7/33. MCV 90. Platelets 261  Chol 145, TG 154, HDL 45, LDL 74.  NTproBNP 615.   Uric Acid 8.3 (H)  11/09/2018: HbA1C 7.6%  03/06/2018: TSH 1.82 normal   Review of Systems  Cardiovascular: Positive for chest pain. Negative for dyspnea on exertion, leg swelling, palpitations and syncope.        Vitals:   07/03/19 0941  BP: (!) 150/56  Pulse: 64   Resp: 16  SpO2: 100%     Body mass index is 29.94 kg/m. Filed Weights   07/03/19 0941  Weight: 169 lb (76.7 kg)     Objective:   Physical Exam Vitals and nursing note reviewed.  Constitutional:      General: She is not in acute distress.    Comments: Mildly obese  Neck:     Vascular: No JVD.  Cardiovascular:     Rate and Rhythm: Normal rate and regular rhythm.     Pulses: Intact distal pulses.          Carotid pulses are on the right side with bruit and on the left side with bruit.      Femoral pulses are 2+ on the right side and 2+ on  the left side.      Popliteal pulses are 1+ on the right side and 1+ on the left side.       Dorsalis pedis pulses are 2+ on the right side and 2+ on the left side.       Posterior tibial pulses are 2+ on the right side and 2+ on the left side.     Heart sounds: Murmur heard.  Systolic murmur is present with a grade of 2/6 at the upper right sternal border radiating to the neck.      Comments: Left upper arm AV fistula present  Pulmonary:     Effort: Pulmonary effort is normal.     Breath sounds: Normal breath sounds. No wheezing or rales.           Assessment & Recommendations:   79 y.o. African American female with hypertension, hyperlipidemia, diabetes with CKD stage III/IV, CAD, h/o strokes (2015, 2020), s/p TAVR for severe AS (11/2018)  Chest pain: Atypical.  Now resolved. Stress test with no ischemia. Nonobstructive CAD  Hypertension: Better controlled after addition of BiDil 20-37.5 mg 3 times daily. Asked her to take labetalol 100 mg bid, which she is currently taking once daily. Continue remote patient monitoring.  S/P TAVR (transcatheter aortic valve replacement): Normal functioning bioprosthetic aortic valve  Chronic kidney disease (CKD), stage IV: Follow-up with nephrology nurse this week this  Nigel Mormon, MD [facility] Pager: [phone] Office: [phone]

## 2019-07-03 ENCOUNTER — Other Ambulatory Visit: Payer: Self-pay | Admitting: Pharmacist

## 2019-07-03 ENCOUNTER — Other Ambulatory Visit: Payer: Self-pay

## 2019-07-03 ENCOUNTER — Encounter: Payer: Self-pay | Admitting: Cardiology

## 2019-07-03 ENCOUNTER — Ambulatory Visit: Payer: Medicare PPO | Admitting: Cardiology

## 2019-07-03 VITALS — BP 150/56 | HR 64 | Resp 16 | Ht 63.0 in | Wt 169.0 lb

## 2019-07-03 DIAGNOSIS — I10 Essential (primary) hypertension: Secondary | ICD-10-CM

## 2019-07-03 DIAGNOSIS — Z952 Presence of prosthetic heart valve: Secondary | ICD-10-CM

## 2019-07-03 DIAGNOSIS — I25118 Atherosclerotic heart disease of native coronary artery with other forms of angina pectoris: Secondary | ICD-10-CM

## 2019-07-03 MED ORDER — LABETALOL HCL 100 MG PO TABS: 100.0000 mg | ORAL_TABLET | Freq: Two times a day (BID) | ORAL | 2 refills | Status: DC

## 2019-07-10 ENCOUNTER — Encounter (INDEPENDENT_AMBULATORY_CARE_PROVIDER_SITE_OTHER): Payer: Self-pay | Admitting: Ophthalmology

## 2019-07-10 ENCOUNTER — Other Ambulatory Visit: Payer: Self-pay

## 2019-07-10 ENCOUNTER — Ambulatory Visit (INDEPENDENT_AMBULATORY_CARE_PROVIDER_SITE_OTHER): Payer: Medicare PPO | Admitting: Ophthalmology

## 2019-07-10 DIAGNOSIS — H34812 Central retinal vein occlusion, left eye, with macular edema: Secondary | ICD-10-CM

## 2019-07-10 MED ORDER — BEVACIZUMAB CHEMO INJECTION 1.25MG/0.05ML SYRINGE FOR KALEIDOSCOPE
1.2500 mg | INTRAVITREAL | Status: AC | PRN
  Administered 2019-07-10: 1.25 mg via INTRAVITREAL

## 2019-07-10 NOTE — Assessment & Plan Note (Signed)
CME OS has improved now 6 weeks post restart of intravitreal Avastin.  Will repeat again today and examination weeks.

## 2019-07-10 NOTE — Progress Notes (Signed)
07/10/2019     CHIEF COMPLAINT Patient presents for Retina Follow Up   HISTORY OF PRESENT ILLNESS: [name] is a 79 y.o. female who presents to the clinic today for:   HPI    Retina Follow Up    Patient presents with  CRVO/BRVO.  In left eye.  Duration of 6 weeks.  Since onset it is stable.          Comments    6 week follow up - OCT OU, Possible Avastin OS Patient denies change in vision and overall has no complaints.        Last edited by Gerda Diss on 07/10/2019  8:42 AM. (History)      Referring physician: Audley Hose, MD [address],  [place] [address]  HISTORICAL INFORMATION:   Selected notes from the MEDICAL RECORD NUMBER    Lab Results  Component Value Date   HGBA1C 7.6 (H) 11/09/2018     CURRENT MEDICATIONS: Current Outpatient Medications (Ophthalmic Drugs)  Medication Sig  . brimonidine (ALPHAGAN) 0.2 % ophthalmic solution   . polyvinyl alcohol (TEARS AGAIN) 1.4 % ophthalmic solution Place 1 drop into both eyes 3 (three) times daily as needed for dry eyes.   No current facility-administered medications for this visit. (Ophthalmic Drugs)   Current Outpatient Medications (Other)  Medication Sig  . acetaminophen (TYLENOL) 325 MG tablet Take 650 mg by mouth every 6 (six) hours as needed for moderate pain or headache.   . allopurinol (ZYLOPRIM) 100 MG tablet Take 50 mg by mouth daily.  Marland Kitchen amLODipine (NORVASC) 10 MG tablet Take 1 tablet by mouth every day (Patient taking differently: Take 10 mg by mouth daily. )  . B-D INS SYR ULTRAFINE 1CC/30G 30G X 1/2" 1 ML MISC Inject 1 each into the skin 3 (three) times daily between meals.  Marland Kitchen BIDIL 20-37.5 MG tablet TAKE 1 TABLET BY MOUTH THREE TIMES A DAY  . calcitRIOL (ROCALTROL) 0.25 MCG capsule Take 0.25 mcg by mouth daily.   . cetirizine (ZYRTEC) 10 MG tablet Take 10 mg by mouth daily.  . clopidogrel (PLAVIX) 75 MG tablet TAKE 1 TABLET BY MOUTH EVERY DAY  . Desvenlafaxine ER  100 MG TB24 Take 100 mg by mouth daily.  . folic acid (FOLVITE) 1 MG tablet Take 1 mg by mouth daily.  Marland Kitchen HUMALOG KWIKPEN 100 UNIT/ML KwikPen Inject 2-8 Units into the skin 3 (three) times daily before meals.   . insulin glargine (LANTUS) 100 UNIT/ML injection Inject 16 Units into the skin at bedtime.   Marland Kitchen labetalol (NORMODYNE) 100 MG tablet Take 1 tablet (100 mg total) by mouth 2 (two) times daily.  Marland Kitchen leflunomide (ARAVA) 10 MG tablet Take 10 mg by mouth daily.  Marland Kitchen lisinopril (ZESTRIL) 5 MG tablet Take 5 mg by mouth daily.  . niacin (NIASPAN) 500 MG CR tablet Take 500 mg by mouth daily.   . nitroGLYCERIN (NITROSTAT) 0.4 MG SL tablet PLACE 1 TABLET (0.4 MG TOTAL) UNDER THE TONGUE EVERY 5 MINUTES X 3 DOSES AS NEEDED FOR CHEST PAIN.  Marland Kitchen nortriptyline (PAMELOR) 10 MG capsule Take 10 mg by mouth daily.  . ONE TOUCH ULTRA TEST test strip 1 each by Other route 3 (three) times daily between meals.  Marland Kitchen PRISTIQ 100 MG 24 hr tablet Take 100 mg by mouth daily.   . rosuvastatin (CRESTOR) 20 MG tablet Take 20 mg by mouth daily.  Marland Kitchen torsemide (DEMADEX) 20 MG tablet Take 1 tablet (  20 mg total) by mouth daily. May take additional dose in the afternoon for swelling, shortness of breath or weight gain.   No current facility-administered medications for this visit. (Other)      REVIEW OF SYSTEMS:    ALLERGIES No Known Allergies  PAST MEDICAL HISTORY Past Medical History:  Diagnosis Date  . Arthritis   . Cerebrovascular disease   . CKD (chronic kidney disease)    Sees Dr Hollie Salk  . Coronary artery disease   . Depression   . Diabetes ([place])    INSULIN DEPENDENT  . Diabetic peripheral neuropathy ([place])   . Diabetic retinopathy ([place])   . Diverticulitis   . GERD (gastroesophageal reflux disease)   . History of CVA (cerebrovascular accident)    x 2 no residulal  . Hyperlipidemia   . Hypertension   . Obesity   . Renal lesion   . S/P TAVR (transcatheter aortic valve replacement) 11/13/2018   s/p TAVR with  a 74mm Edwards S3U via the TF approach by Dr. [name] and Dr. Angelena Form  . Severe aortic stenosis    Past Surgical History:  Procedure Laterality Date  . ABDOMINAL HYSTERECTOMY    . AV FISTULA PLACEMENT Left 12/18/2017   Procedure: ARTERIOVENOUS (AV) FISTULA CREATION ARM;  Surgeon: Marty Heck, MD;  Location: [facility];  Service: Vascular;  Laterality: Left;  . bilateral cataract surgery    . CARDIAC CATHETERIZATION  01/07/2014   DR [place] RESECTION  02/1999  . COLONOSCOPY    . COLOSTOMY  02/1999  . COLOSTOMY CLOSURE  07/1999  . EYE SURGERY Left 2019  . FISTULA SUPERFICIALIZATION Left 06/29/2018   Procedure: FISTULA SUPERFICIALIZATION LEFT BRACHIOCEPHALIC;  Surgeon: Marty Heck, MD;  Location: [facility];  Service: Vascular;  Laterality: Left;  . LEFT HEART CATHETERIZATION WITH CORONARY ANGIOGRAM N/A 01/07/2014   Procedure: LEFT HEART CATHETERIZATION WITH CORONARY ANGIOGRAM;  Surgeon: Laverda Page, MD;  Location: [facility] CATH LAB;  Service: Cardiovascular;  Laterality: N/A;  . middle cerebral artery stent placement Right   . OTHER SURGICAL HISTORY     laser surgery  . PTCA  01/07/2014   DES to RCA    DR Einar Gip  . right knee surgery Right    for infection  . RIGHT/LEFT HEART CATH AND CORONARY ANGIOGRAPHY N/A 10/23/2018   Procedure: RIGHT/LEFT HEART CATH AND CORONARY ANGIOGRAPHY;  Surgeon: Nigel Mormon, MD;  Location: [facility] CV LAB;  Service: Cardiovascular;  Laterality: N/A;  . TEE WITHOUT CARDIOVERSION N/A 11/13/2018   Procedure: TRANSESOPHAGEAL ECHOCARDIOGRAM (TEE);  Surgeon: Burnell Blanks, MD;  Location: [facility];  Service: Open Heart Surgery;  Laterality: N/A;  . TRANSCATHETER AORTIC VALVE REPLACEMENT, TRANSFEMORAL  11/13/2018  . TRANSCATHETER AORTIC VALVE REPLACEMENT, TRANSFEMORAL N/A 11/13/2018   Procedure: TRANSCATHETER AORTIC VALVE REPLACEMENT, TRANSFEMORAL;  Surgeon: Burnell Blanks, MD;  Location: [facility];  Service: Open Heart Surgery;   Laterality: N/A;    FAMILY HISTORY Family History  Problem Relation Age of Onset  . Diabetes Mother   . Heart disease Mother   . Prostate cancer Father   . Hypertension Brother   . Prostate cancer Brother     SOCIAL HISTORY Social History   Tobacco Use  . Smoking status: Never Smoker  . Smokeless tobacco: Never Used  Vaping Use  . Vaping Use: Never used  Substance Use Topics  . Alcohol use: No  . Drug use: No         OPHTHALMIC EXAM:  Base  Eye Exam    Visual Acuity (Snellen - Linear)      Right Left   Dist [place] 20/20-1 CF @ 3'   Dist ph [place]  NI       Tonometry (Tonopen, 8:48 AM)      Right Left   Pressure 13 16       Pupils      Dark Light Shape React APD   Right 4 3 Round Brisk None   Left 5 5 Irregular Minimal None       Visual Fields (Counting fingers)      Left Right    Full Full       Extraocular Movement      Right Left    Full Full       Neuro/Psych    Oriented x3: Yes   Mood/Affect: Normal       Dilation    Left eye: 1.0% Mydriacyl, 2.5% Phenylephrine @ 8:48 AM        Slit Lamp and Fundus Exam    External Exam      Right Left   External Normal Normal       Slit Lamp Exam      Right Left   Lids/Lashes Normal Normal   Conjunctiva/Sclera White and quiet White and quiet   Cornea Clear Clear   Anterior Chamber Deep and quiet Deep and quiet   Iris Round and reactive Round and reactive   Anterior Vitreous Normal Normal       Fundus Exam      Right Left   Posterior Vitreous  vitrectomized   Disc  Normal   C/D Ratio 0. 0.6   Macula  no Cystoid macular edema, no exudates, Macular thickening, Microaneurysms   Vessels  old CRVO   Periphery  good prp           IMAGING AND PROCEDURES  Imaging and Procedures for 07/10/19  OCT, Retina - OU - Both Eyes       Right Eye Quality was good. Scan locations included subfoveal. Central Foveal Thickness: 256. Progression has been stable. Findings include normal observations.    Left Eye Quality was good. Scan locations included subfoveal. Central Foveal Thickness: 240.   Notes OS much less CME from old prior CRVO, 6 weeks post intravitreal Avastin.  Will repeat intravitreal Avastin OS today and exam in 8 weeks OS.       Intravitreal Injection, Pharmacologic Agent - OS - Left Eye       Time Out 07/10/2019. 10:00 AM. Confirmed correct patient, procedure, site, and patient consented.   Anesthesia Topical anesthesia was used. Anesthetic medications included [place] 3.5%.   Procedure Preparation included Tobramycin 0.3%, 10% betadine to eyelids. A 30 gauge needle was used.   Injection:  1.25 mg Bevacizumab (AVASTIN) SOLN   NDC: [address]-1, Lot: [id number]   Route: Intravitreal, Site: Left Eye, Waste: 0 mg  Post-op Post injection exam found visual acuity of at least counting fingers. The patient tolerated the procedure well. There were no complications. The patient received written and verbal post procedure care education. Post injection medications were not given.                 ASSESSMENT/PLAN:  Central retinal vein occlusion, left eye, with macular edema CME OS has improved now 6 weeks post restart of intravitreal Avastin.  Will repeat again today and examination weeks.      ICD-10-CM   1. Central retinal vein occlusion with macular edema  of left eye  H34.8120 OCT, Retina - OU - Both Eyes    Intravitreal Injection, Pharmacologic Agent - OS - Left Eye    Bevacizumab (AVASTIN) SOLN 1.25 mg  2. Central retinal vein occlusion, left eye, with macular edema  H34.8120     1.  2.  3.  Ophthalmic Meds Ordered this visit:  Meds ordered this encounter  Medications  . Bevacizumab (AVASTIN) SOLN 1.25 mg       Return in about 8 weeks (around 09/04/2019) for dilate, OS, AVASTIN OCT.  There are no Patient Instructions on file for this visit.   Explained the diagnoses, plan, and follow up with the patient and they expressed understanding.   Patient expressed understanding of the importance of proper follow up care.   Clent Demark Tsuruko Murtha [name] Diseases & Surgery of the Retina and Vitreous [facility]ina [facility] [place] 07/10/19     Abbreviations: M myopia (nearsighted); A astigmatism; H hyperopia (farsighted); P presbyopia; Mrx spectacle prescription;  CTL contact lenses; OD right eye; OS left eye; OU both eyes  XT exotropia; ET esotropia; PEK punctate epithelial keratitis; PEE punctate epithelial erosions; DES dry eye syndrome; MGD meibomian gland dysfunction; ATs artificial tears; PFAT's preservative free artificial tears; [place] nuclear sclerotic cataract; PSC posterior subcapsular cataract; ERM epi-retinal membrane; PVD posterior vitreous detachment; RD retinal detachment; DM diabetes mellitus; DR diabetic retinopathy; NPDR non-proliferative diabetic retinopathy; PDR proliferative diabetic retinopathy; CSME clinically significant macular edema; DME diabetic macular edema; dbh dot blot hemorrhages; CWS cotton wool spot; POAG primary open angle glaucoma; C/D cup-to-disc ratio; HVF humphrey visual field; GVF goldmann visual field; OCT optical coherence tomography; IOP intraocular pressure; BRVO Branch retinal vein occlusion; CRVO central retinal vein occlusion; CRAO central retinal artery occlusion; BRAO branch retinal artery occlusion; RT retinal tear; SB scleral buckle; PPV pars plana vitrectomy; VH Vitreous hemorrhage; PRP panretinal laser photocoagulation; IVK intravitreal kenalog; VMT vitreomacular traction; MH Macular hole;  NVD neovascularization of the disc; NVE neovascularization elsewhere; AREDS age related eye disease study; ARMD age related macular degeneration; POAG primary open angle glaucoma; EBMD epithelial/anterior basement membrane dystrophy; ACIOL anterior chamber intraocular lens; IOL intraocular lens; PCIOL posterior chamber intraocular lens; Phaco/IOL phacoemulsification with intraocular lens placement; [place] photorefractive  keratectomy; LASIK laser assisted in situ keratomileusis; HTN hypertension; DM diabetes mellitus; COPD chronic obstructive pulmonary disease

## 2019-07-19 ENCOUNTER — Telehealth: Payer: Self-pay | Admitting: Pharmacist

## 2019-07-19 NOTE — Telephone Encounter (Signed)
Med list reviewed.

## 2019-07-25 ENCOUNTER — Other Ambulatory Visit: Payer: Self-pay | Admitting: Cardiology

## 2019-07-25 DIAGNOSIS — I10 Essential (primary) hypertension: Secondary | ICD-10-CM

## 2019-08-14 ENCOUNTER — Other Ambulatory Visit: Payer: Self-pay | Admitting: Pharmacist

## 2019-08-17 ENCOUNTER — Other Ambulatory Visit: Payer: Self-pay | Admitting: Cardiology

## 2019-08-17 DIAGNOSIS — I10 Essential (primary) hypertension: Secondary | ICD-10-CM

## 2019-09-04 ENCOUNTER — Encounter (INDEPENDENT_AMBULATORY_CARE_PROVIDER_SITE_OTHER): Payer: Medicare PPO | Admitting: Ophthalmology

## 2019-09-09 ENCOUNTER — Other Ambulatory Visit: Payer: Self-pay

## 2019-09-09 ENCOUNTER — Ambulatory Visit (INDEPENDENT_AMBULATORY_CARE_PROVIDER_SITE_OTHER): Payer: Medicare PPO | Admitting: Ophthalmology

## 2019-09-09 ENCOUNTER — Encounter (INDEPENDENT_AMBULATORY_CARE_PROVIDER_SITE_OTHER): Payer: Self-pay | Admitting: Ophthalmology

## 2019-09-09 DIAGNOSIS — H34812 Central retinal vein occlusion, left eye, with macular edema: Secondary | ICD-10-CM | POA: Diagnosis not present

## 2019-09-09 MED ORDER — BEVACIZUMAB CHEMO INJECTION 1.25MG/0.05ML SYRINGE FOR KALEIDOSCOPE
1.2500 mg | INTRAVITREAL | Status: AC | PRN
  Administered 2019-09-09: 1.25 mg via INTRAVITREAL

## 2019-09-09 NOTE — Assessment & Plan Note (Signed)
CME from CRV O slightly worse nasal to the Foveal region, will retreat today with Avastin currently 8-week interval CME related vascular

## 2019-09-09 NOTE — Progress Notes (Signed)
09/09/2019     CHIEF COMPLAINT Patient presents for Retina Follow Up   HISTORY OF PRESENT ILLNESS: [name] is a 79 y.o. female who presents to the clinic today for:   HPI    Retina Follow Up    Patient presents with  CRVO/BRVO.  In left eye.  This started 9 weeks ago.  Severity is mild.  Duration of 9 weeks.  Since onset it is stable.          Comments    9 Week CRVO F/U OS, poss Avastin OS  Pt reports fluctuating VA OS, but overall stable. No new symptoms reported OU. LBS: 75 this AM       Last edited by Rockie Neighbours, Stewart on 09/09/2019  9:30 AM. (History)      Referring physician: Audley Hose, MD [place],  [place] [address]  HISTORICAL INFORMATION:   Selected notes from the MEDICAL RECORD NUMBER    Lab Results  Component Value Date   HGBA1C 7.6 (H) 11/09/2018     CURRENT MEDICATIONS: Current Outpatient Medications (Ophthalmic Drugs)  Medication Sig  . brimonidine (ALPHAGAN) 0.2 % ophthalmic solution   . polyvinyl alcohol (TEARS AGAIN) 1.4 % ophthalmic solution Place 1 drop into both eyes 3 (three) times daily as needed for dry eyes.   No current facility-administered medications for this visit. (Ophthalmic Drugs)   Current Outpatient Medications (Other)  Medication Sig  . acetaminophen (TYLENOL) 325 MG tablet Take 650 mg by mouth every 6 (six) hours as needed for moderate pain or headache.   . allopurinol (ZYLOPRIM) 100 MG tablet Take 50 mg by mouth daily.  Marland Kitchen amLODipine (NORVASC) 10 MG tablet Take 1 tablet by mouth every day (Patient taking differently: Take 10 mg by mouth daily. Taking at night)  . B-D INS SYR ULTRAFINE 1CC/30G 30G X 1/2" 1 ML MISC Inject 1 each into the skin 3 (three) times daily between meals.  Marland Kitchen BIDIL 20-37.5 MG tablet TAKE 1 TABLET BY MOUTH THREE TIMES A DAY (Patient taking differently: Take 2 tablets by mouth 3 (three) times daily. )  . calcitRIOL (ROCALTROL) 0.25 MCG capsule Take 0.25 mcg by  mouth daily.   . cetirizine (ZYRTEC) 10 MG tablet Take 10 mg by mouth daily.  . clopidogrel (PLAVIX) 75 MG tablet TAKE 1 TABLET BY MOUTH EVERY DAY  . Desvenlafaxine ER 100 MG TB24 Take 100 mg by mouth daily.  . folic acid (FOLVITE) 1 MG tablet Take 1 mg by mouth daily.  Marland Kitchen HUMALOG KWIKPEN 100 UNIT/ML KwikPen Inject 2-8 Units into the skin 3 (three) times daily before meals.   . insulin glargine (LANTUS) 100 UNIT/ML injection Inject 16 Units into the skin at bedtime.   Marland Kitchen labetalol (NORMODYNE) 100 MG tablet Take 1 tablet (100 mg total) by mouth 2 (two) times daily. (Patient taking differently: Take 200 mg by mouth 2 (two) times daily. )  . leflunomide (ARAVA) 10 MG tablet Take 10 mg by mouth daily.  . niacin (NIASPAN) 500 MG CR tablet Take 500 mg by mouth daily.   . nitroGLYCERIN (NITROSTAT) 0.4 MG SL tablet PLACE 1 TABLET (0.4 MG TOTAL) UNDER THE TONGUE EVERY 5 MINUTES X 3 DOSES AS NEEDED FOR CHEST PAIN.  Marland Kitchen nortriptyline (PAMELOR) 10 MG capsule Take 10 mg by mouth daily.  . ONE TOUCH ULTRA TEST test strip 1 each by Other route 3 (three) times daily between meals.  Marland Kitchen PRISTIQ 100 MG 24 hr  tablet Take 100 mg by mouth daily.   . rosuvastatin (CRESTOR) 20 MG tablet Take 20 mg by mouth daily.  Marland Kitchen torsemide (DEMADEX) 20 MG tablet Take 1 tablet (20 mg total) by mouth daily. May take additional dose in the afternoon for swelling, shortness of breath or weight gain. (Patient taking differently: Take 40 mg by mouth daily. May take additional dose in the afternoon for swelling, shortness of breath or weight gain.)   No current facility-administered medications for this visit. (Other)      REVIEW OF SYSTEMS:    ALLERGIES No Known Allergies  PAST MEDICAL HISTORY Past Medical History:  Diagnosis Date  . Arthritis   . Cerebrovascular disease   . CKD (chronic kidney disease)    Sees Dr Hollie Salk  . Coronary artery disease   . Depression   . Diabetes ([place])    INSULIN DEPENDENT  . Diabetic peripheral  neuropathy ([place])   . Diabetic retinopathy ([place])   . Diverticulitis   . GERD (gastroesophageal reflux disease)   . History of CVA (cerebrovascular accident)    x 2 no residulal  . Hyperlipidemia   . Hypertension   . Obesity   . Renal lesion   . S/P TAVR (transcatheter aortic valve replacement) 11/13/2018   s/p TAVR with a 19mm Edwards S3U via the TF approach by Dr. [name] and Dr. Angelena Form  . Severe aortic stenosis    Past Surgical History:  Procedure Laterality Date  . ABDOMINAL HYSTERECTOMY    . AV FISTULA PLACEMENT Left 12/18/2017   Procedure: ARTERIOVENOUS (AV) FISTULA CREATION ARM;  Surgeon: Marty Heck, MD;  Location: [facility];  Service: Vascular;  Laterality: Left;  . bilateral cataract surgery    . CARDIAC CATHETERIZATION  01/07/2014   DR Ross RESECTION  02/1999  . COLONOSCOPY    . COLOSTOMY  02/1999  . COLOSTOMY CLOSURE  07/1999  . EYE SURGERY Left 2019  . FISTULA SUPERFICIALIZATION Left 06/29/2018   Procedure: FISTULA SUPERFICIALIZATION LEFT BRACHIOCEPHALIC;  Surgeon: Marty Heck, MD;  Location: [facility];  Service: Vascular;  Laterality: Left;  . LEFT HEART CATHETERIZATION WITH CORONARY ANGIOGRAM N/A 01/07/2014   Procedure: LEFT HEART CATHETERIZATION WITH CORONARY ANGIOGRAM;  Surgeon: Laverda Page, MD;  Location: [facility] CATH LAB;  Service: Cardiovascular;  Laterality: N/A;  . middle cerebral artery stent placement Right   . OTHER SURGICAL HISTORY     laser surgery  . PTCA  01/07/2014   DES to RCA    DR Einar Gip  . right knee surgery Right    for infection  . RIGHT/LEFT HEART CATH AND CORONARY ANGIOGRAPHY N/A 10/23/2018   Procedure: RIGHT/LEFT HEART CATH AND CORONARY ANGIOGRAPHY;  Surgeon: Nigel Mormon, MD;  Location: [facility] CV LAB;  Service: Cardiovascular;  Laterality: N/A;  . TEE WITHOUT CARDIOVERSION N/A 11/13/2018   Procedure: TRANSESOPHAGEAL ECHOCARDIOGRAM (TEE);  Surgeon: Burnell Blanks, MD;  Location: [facility];  Service: Open  Heart Surgery;  Laterality: N/A;  . TRANSCATHETER AORTIC VALVE REPLACEMENT, TRANSFEMORAL  11/13/2018  . TRANSCATHETER AORTIC VALVE REPLACEMENT, TRANSFEMORAL N/A 11/13/2018   Procedure: TRANSCATHETER AORTIC VALVE REPLACEMENT, TRANSFEMORAL;  Surgeon: Burnell Blanks, MD;  Location: [facility];  Service: Open Heart Surgery;  Laterality: N/A;    FAMILY HISTORY Family History  Problem Relation Age of Onset  . Diabetes Mother   . Heart disease Mother   . Prostate cancer Father   . Hypertension Brother   . Prostate cancer Brother  SOCIAL HISTORY Social History   Tobacco Use  . Smoking status: Never Smoker  . Smokeless tobacco: Never Used  Vaping Use  . Vaping Use: Never used  Substance Use Topics  . Alcohol use: No  . Drug use: No         OPHTHALMIC EXAM:  Base Eye Exam    Visual Acuity (ETDRS)      Right Left   Dist [place] 20/20 -2 CF @ 3'   Dist ph   20/400       Tonometry (Tonopen, 9:31 AM)      Right Left   Pressure 18 17       Pupils      Dark Light Shape React APD   Right 4 3 Round Brisk None   Left 5 5 Irregular Minimal None       Visual Fields (Counting fingers)      Left Right   Restrictions Total superior nasal deficiency Total superior temporal, inferior temporal deficiencies       Extraocular Movement      Right Left    Full Full       Neuro/Psych    Oriented x3: Yes   Mood/Affect: Normal       Dilation    Left eye: 1.0% Mydriacyl, 2.5% Phenylephrine @ 9:34 AM        Slit Lamp and Fundus Exam    External Exam      Right Left   External Normal Normal       Slit Lamp Exam      Right Left   Lids/Lashes Normal Normal   Conjunctiva/Sclera White and quiet White and quiet   Cornea Clear Clear   Anterior Chamber Deep and quiet Deep and quiet   Iris Round and reactive Round and reactive   Lens Centered posterior chamber intraocular lens Centered anterior chamber intraocular lens   Anterior Vitreous Normal Normal       Fundus  Exam      Right Left   Posterior Vitreous  vitrectomized   Disc  Normal   C/D Ratio 0. 0.6   Macula   Cystoid macular edema, no exudates, Macular thickening, Microaneurysms   Vessels  old CRVO   Periphery  good prp           IMAGING AND PROCEDURES  Imaging and Procedures for 09/09/19  OCT, Retina - OU - Both Eyes       Right Eye Quality was good. Scan locations included subfoveal. Central Foveal Thickness: 248. Progression has been stable.   Left Eye Quality was good. Scan locations included subfoveal. Central Foveal Thickness: 296. Progression has improved. Findings include cystoid macular edema.   Notes CME from [facility] O slightly worse nasal to the Foveal region, will retreat today with Avastin currently 8-week interval CME related vascular       Intravitreal Injection, Pharmacologic Agent - OS - Left Eye       Time Out 09/09/2019. 10:15 AM. Confirmed correct patient, procedure, site, and patient consented.   Anesthesia Topical anesthesia was used. Anesthetic medications included [place]ten 3.5%.   Procedure Preparation included 5% betadine to ocular surface, 10% betadine to eyelids, Tobramycin 0.3%. A supplied needle was used.   Injection:  1.25 mg Bevacizumab (AVASTIN) SOLN   NDC: [address]-9, Lot: [id number]   Route: Intravitreal, Site: Left Eye, Waste: 0 mg  Post-op Post injection exam found visual acuity of at least counting fingers. The patient tolerated the procedure well. There were no complications.  The patient received written and verbal post procedure care education. Post injection medications were not given.                 ASSESSMENT/PLAN:  Central retinal vein occlusion, left eye, with macular edema CME from [facility] O slightly worse nasal to the Foveal region, will retreat today with Avastin currently 8-week interval CME related vascular      ICD-10-CM   1. Central retinal vein occlusion with macular edema of left eye  H34.8120 OCT, Retina - OU - Both  Eyes    Intravitreal Injection, Pharmacologic Agent - OS - Left Eye    Bevacizumab (AVASTIN) SOLN 1.25 mg  2. Central retinal vein occlusion, left eye, with macular edema  H34.8120     1.  Repeat intravitreal Avastin OS today, to limit spread of CME.  No impact overall on acuity for longstanding nonperfusion limits acuity OS  2.  Repeat examination OS in 6 to 7 weeks, likely intravitreal Avastin  3.  Ophthalmic Meds Ordered this visit:  Meds ordered this encounter  Medications  . Bevacizumab (AVASTIN) SOLN 1.25 mg       Return in about 6 weeks (around 10/21/2019) for dilate, OS, AVASTIN OCT.  There are no Patient Instructions on file for this visit.   Explained the diagnoses, plan, and follow up with the patient and they expressed understanding.  Patient expressed understanding of the importance of proper follow up care.   Clent Demark Nickolette Espinola [name] Diseases & Surgery of the Retina and Vitreous [facility]ina [facility] Diabetic [place] 09/09/19     Abbreviations: M myopia (nearsighted); A astigmatism; H hyperopia (farsighted); P presbyopia; Mrx spectacle prescription;  CTL contact lenses; OD right eye; OS left eye; OU both eyes  XT exotropia; ET esotropia; PEK punctate epithelial keratitis; PEE punctate epithelial erosions; DES dry eye syndrome; MGD meibomian gland dysfunction; ATs artificial tears; PFAT's preservative free artificial tears; [place] nuclear sclerotic cataract; PSC posterior subcapsular cataract; ERM epi-retinal membrane; PVD posterior vitreous detachment; RD retinal detachment; DM diabetes mellitus; DR diabetic retinopathy; NPDR non-proliferative diabetic retinopathy; PDR proliferative diabetic retinopathy; CSME clinically significant macular edema; DME diabetic macular edema; dbh dot blot hemorrhages; CWS cotton wool spot; POAG primary open angle glaucoma; C/D cup-to-disc ratio; HVF humphrey visual field; GVF goldmann visual field; OCT optical coherence tomography; IOP intraocular  pressure; BRVO Branch retinal vein occlusion; CRVO central retinal vein occlusion; CRAO central retinal artery occlusion; BRAO branch retinal artery occlusion; RT retinal tear; SB scleral buckle; PPV pars plana vitrectomy; VH Vitreous hemorrhage; PRP panretinal laser photocoagulation; IVK intravitreal kenalog; VMT vitreomacular traction; MH Macular hole;  NVD neovascularization of the disc; NVE neovascularization elsewhere; AREDS age related eye disease study; ARMD age related macular degeneration; POAG primary open angle glaucoma; EBMD epithelial/anterior basement membrane dystrophy; ACIOL anterior chamber intraocular lens; IOL intraocular lens; PCIOL posterior chamber intraocular lens; Phaco/IOL phacoemulsification with intraocular lens placement; [place] photorefractive keratectomy; LASIK laser assisted in situ keratomileusis; HTN hypertension; DM diabetes mellitus; COPD chronic obstructive pulmonary disease

## 2019-09-15 ENCOUNTER — Other Ambulatory Visit: Payer: Self-pay | Admitting: Cardiology

## 2019-09-15 DIAGNOSIS — I10 Essential (primary) hypertension: Secondary | ICD-10-CM

## 2019-09-26 ENCOUNTER — Other Ambulatory Visit: Payer: Self-pay | Admitting: Cardiology

## 2019-09-26 DIAGNOSIS — I350 Nonrheumatic aortic (valve) stenosis: Secondary | ICD-10-CM

## 2019-09-26 DIAGNOSIS — Z952 Presence of prosthetic heart valve: Secondary | ICD-10-CM

## 2019-10-21 ENCOUNTER — Encounter (INDEPENDENT_AMBULATORY_CARE_PROVIDER_SITE_OTHER): Payer: Self-pay | Admitting: Ophthalmology

## 2019-10-21 ENCOUNTER — Ambulatory Visit (INDEPENDENT_AMBULATORY_CARE_PROVIDER_SITE_OTHER): Payer: Medicare PPO | Admitting: Ophthalmology

## 2019-10-21 ENCOUNTER — Other Ambulatory Visit: Payer: Self-pay

## 2019-10-21 DIAGNOSIS — H34812 Central retinal vein occlusion, left eye, with macular edema: Secondary | ICD-10-CM

## 2019-10-21 MED ORDER — BEVACIZUMAB CHEMO INJECTION 1.25MG/0.05ML SYRINGE FOR KALEIDOSCOPE
1.2500 mg | INTRAVITREAL | Status: AC | PRN
  Administered 2019-10-21: 1.25 mg via INTRAVITREAL

## 2019-10-21 NOTE — Assessment & Plan Note (Signed)
CME from [facility] O has improved currently at 6-week interval, will repeat injection Avastin today and examination again in 7-week

## 2019-10-21 NOTE — Progress Notes (Signed)
10/21/2019     CHIEF COMPLAINT Patient presents for Retina Follow Up   HISTORY OF PRESENT ILLNESS: [name] is a 79 y.o. female who presents to the clinic today for:   HPI    Retina Follow Up    Patient presents with  CRVO/BRVO.  In left eye.  This started 6 weeks ago.  Severity is mild.  Duration of 6 weeks.  Since onset it is stable.          Comments    6 Week CRVO F/U OS, poss Avastin OS  Pt denies noticeable changes to [place] OU since last visit. Pt denies ocular pain, flashes of light, or floaters OU.  LBS: 102 last night       Last edited by Rockie Neighbours, [place] on 10/21/2019  9:02 AM. (History)      Referring physician: Audley Hose, MD [place],  [place] [id number]  HISTORICAL INFORMATION:   Selected notes from the MEDICAL RECORD NUMBER    Lab Results  Component Value Date   HGBA1C 7.6 (H) 11/09/2018     CURRENT MEDICATIONS: Current Outpatient Medications (Ophthalmic Drugs)  Medication Sig  . brimonidine (ALPHAGAN) 0.2 % ophthalmic solution   . polyvinyl alcohol (TEARS AGAIN) 1.4 % ophthalmic solution Place 1 drop into both eyes 3 (three) times daily as needed for dry eyes.   No current facility-administered medications for this visit. (Ophthalmic Drugs)   Current Outpatient Medications (Other)  Medication Sig  . acetaminophen (TYLENOL) 325 MG tablet Take 650 mg by mouth every 6 (six) hours as needed for moderate pain or headache.   . allopurinol (ZYLOPRIM) 100 MG tablet Take 50 mg by mouth daily.  Marland Kitchen amLODipine (NORVASC) 10 MG tablet Take 1 tablet by mouth every day (Patient taking differently: Take 10 mg by mouth daily. Taking at night)  . B-D INS SYR ULTRAFINE 1CC/30G 30G X 1/2" 1 ML MISC Inject 1 each into the skin 3 (three) times daily between meals.  . calcitRIOL (ROCALTROL) 0.25 MCG capsule Take 0.25 mcg by mouth daily.   . cetirizine (ZYRTEC) 10 MG tablet Take 10 mg by mouth daily.  . clopidogrel (PLAVIX) 75 MG  tablet TAKE 1 TABLET BY MOUTH EVERY DAY  . Desvenlafaxine ER 100 MG TB24 Take 100 mg by mouth daily.  . folic acid (FOLVITE) 1 MG tablet Take 1 mg by mouth daily.  Marland Kitchen HUMALOG KWIKPEN 100 UNIT/ML KwikPen Inject 2-8 Units into the skin 3 (three) times daily before meals.   . insulin glargine (LANTUS) 100 UNIT/ML injection Inject 16 Units into the skin at bedtime.   . isosorbide-hydrALAZINE (BIDIL) 20-37.5 MG tablet Take 2 tablets by mouth 3 (three) times daily.  Marland Kitchen labetalol (NORMODYNE) 100 MG tablet Take 1 tablet (100 mg total) by mouth 2 (two) times daily. (Patient taking differently: Take 200 mg by mouth 2 (two) times daily. )  . leflunomide (ARAVA) 10 MG tablet Take 10 mg by mouth daily.  . niacin (NIASPAN) 500 MG CR tablet Take 500 mg by mouth daily.   . nitroGLYCERIN (NITROSTAT) 0.4 MG SL tablet PLACE 1 TABLET (0.4 MG TOTAL) UNDER THE TONGUE EVERY 5 MINUTES X 3 DOSES AS NEEDED FOR CHEST PAIN.  Marland Kitchen nortriptyline (PAMELOR) 10 MG capsule Take 10 mg by mouth daily.  . ONE TOUCH ULTRA TEST test strip 1 each by Other route 3 (three) times daily between meals.  Marland Kitchen PRISTIQ 100 MG 24 hr tablet Take 100 mg  by mouth daily.   . rosuvastatin (CRESTOR) 20 MG tablet Take 20 mg by mouth daily.  Marland Kitchen torsemide (DEMADEX) 20 MG tablet Take 1 tablet (20 mg total) by mouth daily. May take additional dose in the afternoon for swelling, shortness of breath or weight gain. (Patient taking differently: Take 40 mg by mouth daily. May take additional dose in the afternoon for swelling, shortness of breath or weight gain.)   No current facility-administered medications for this visit. (Other)      REVIEW OF SYSTEMS:    ALLERGIES No Known Allergies  PAST MEDICAL HISTORY Past Medical History:  Diagnosis Date  . Arthritis   . Cerebrovascular disease   . CKD (chronic kidney disease)    Sees Dr Hollie Salk  . Coronary artery disease   . Depression   . Diabetes ([place])    INSULIN DEPENDENT  . Diabetic peripheral neuropathy  ([place])   . Diabetic retinopathy ([place])   . Diverticulitis   . GERD (gastroesophageal reflux disease)   . History of CVA (cerebrovascular accident)    x 2 no residulal  . Hyperlipidemia   . Hypertension   . Obesity   . Renal lesion   . S/P TAVR (transcatheter aortic valve replacement) 11/13/2018   s/p TAVR with a 20mm Edwards S3U via the TF approach by Dr. [name] and Dr. Angelena Form  . Severe aortic stenosis    Past Surgical History:  Procedure Laterality Date  . ABDOMINAL HYSTERECTOMY    . AV FISTULA PLACEMENT Left 12/18/2017   Procedure: ARTERIOVENOUS (AV) FISTULA CREATION ARM;  Surgeon: Marty Heck, MD;  Location: [place];  Service: Vascular;  Laterality: Left;  . bilateral cataract surgery    . CARDIAC CATHETERIZATION  01/07/2014   DR [place] RESECTION  02/1999  . COLONOSCOPY    . COLOSTOMY  02/1999  . COLOSTOMY CLOSURE  07/1999  . EYE SURGERY Left 2019  . FISTULA SUPERFICIALIZATION Left 06/29/2018   Procedure: FISTULA SUPERFICIALIZATION LEFT BRACHIOCEPHALIC;  Surgeon: Marty Heck, MD;  Location: [place];  Service: Vascular;  Laterality: Left;  . LEFT HEART CATHETERIZATION WITH CORONARY ANGIOGRAM N/A 01/07/2014   Procedure: LEFT HEART CATHETERIZATION WITH CORONARY ANGIOGRAM;  Surgeon: Laverda Page, MD;  [facility] C[facility] LAB;  Service: Cardiovascular;  Laterality: N/A;  . middle cerebral artery stent placement Right   . OTHER SURGICAL HISTORY     laser surgery  . PTCA  01/07/2014   DES to RCA    DR Einar Gip  . right knee surgery Right    for infection  . RIGHT/LEFT HEART CATH AND CORONARY ANGIOGRAPHY N/A 10/23/2018   Procedure: RIGHT/LEFT HEART CATH AND CORONARY ANGIOGRAPHY;  Surgeon: Nigel Mormon, MD;  Location: [facility] CV LAB;  Service: Cardiovascular;  Laterality: N/A;  . TEE WITHOUT CARDIOVERSION N/A 11/13/2018   Procedure: TRANSESOPHAGEAL ECHOCARDIOGRAM (TEE);  Surgeon: Burnell Blanks, MD;  Location: [place];  Service: Open Heart  Surgery;  Laterality: N/A;  . TRANSCATHETER AORTIC VALVE REPLACEMENT, TRANSFEMORAL  11/13/2018  . TRANSCATHETER AORTIC VALVE REPLACEMENT, TRANSFEMORAL N/A 11/13/2018   Procedure: TRANSCATHETER AORTIC VALVE REPLACEMENT, TRANSFEMORAL;  Surgeon: Burnell Blanks, MD;  Location: [place];  Service: Open Heart Surgery;  Laterality: N/A;    FAMILY HISTORY Family History  Problem Relation Age of Onset  . Diabetes Mother   . Heart disease Mother   . Prostate cancer Father   . Hypertension Brother   . Prostate cancer Brother     SOCIAL HISTORY Social History  Tobacco Use  . Smoking status: Never Smoker  . Smokeless tobacco: Never Used  Vaping Use  . Vaping Use: Never used  Substance Use Topics  . Alcohol use: No  . Drug use: No         OPHTHALMIC EXAM:  Base Eye Exam    Visual Acuity (ETDRS)      Right Left   Dist [place] 20/20 CF at 3'   Dist ph [place]  20/400       Tonometry (Tonopen, 9:03 AM)      Right Left   Pressure 19 24       Pupils      Dark Light Shape React APD   Right 4 3 Round Brisk None   Left 6 6 Irregular Minimal None       Visual Fields (Counting fingers)      Left Right   Restrictions Total superior nasal deficiency Total superior temporal, inferior temporal deficiencies       Extraocular Movement      Right Left    Full Full       Neuro/Psych    Oriented x3: Yes   Mood/Affect: Normal       Dilation    Left eye: 1.0% Mydriacyl, 2.5% Phenylephrine @ 9:05 AM        Slit Lamp and Fundus Exam    External Exam      Right Left   External Normal Normal       Slit Lamp Exam      Right Left   Lids/Lashes Normal Normal   Conjunctiva/Sclera White and quiet White and quiet   Cornea Clear Clear   Anterior Chamber Deep and quiet Deep and quiet   Iris Round and reactive Round and reactive   Lens Centered posterior chamber intraocular lens Centered anterior chamber intraocular lens   Anterior Vitreous Normal Normal       Fundus Exam       Right Left   Posterior Vitreous  vitrectomized   Disc  Normal   C/D Ratio 0. 0.6   Macula   Cystoid macular edema less, no exudates, Macular thickening, Microaneurysms   Vessels  old CRVO   Periphery  good prp           IMAGING AND PROCEDURES  Imaging and Procedures for 10/21/19  OCT, Retina - OU - Both Eyes       Right Eye Quality was good. Central Foveal Thickness: 248. Progression has been stable.   Left Eye Quality was good. Scan locations included subfoveal. Central Foveal Thickness: 261. Progression has improved. Findings include cystoid macular edema.   Notes CME from [facility] O, improved currently at 6-week interval post intravitreal Avastin, will repeat today to maintain And examination again in 6 to 7 weeks       Intravitreal Injection, Pharmacologic Agent - OS - Left Eye       Time Out 10/21/2019. 9:51 AM. Confirmed correct patient, procedure, site, and patient consented.   Anesthesia Topical anesthesia was used. Anesthetic medications included [place] 3.5%.   Procedure Preparation included Tobramycin 0.3%, Ofloxacin , 10% betadine to eyelids, 5% betadine to ocular surface. A supplied needle was used.   Injection:  1.25 mg Bevacizumab (AVASTIN) SOLN   NDC: [address]-0, Lot: [id number]   Route: Intravitreal, Site: Left Eye, Waste: 0 mg  Post-op Post injection exam found visual acuity of at least counting fingers. The patient tolerated the procedure well. There were no complications. The patient received written and  verbal post procedure care education. Post injection medications were not given.                 ASSESSMENT/PLAN:  Central retinal vein occlusion, left eye, with macular edema CME from [facility] O has improved currently at 6-week interval, will repeat injection Avastin today and examination again in 7-week      ICD-10-CM   1. Central retinal vein occlusion with macular edema of left eye  H34.8120 OCT, Retina - OU - Both Eyes    Intravitreal  Injection, Pharmacologic Agent - OS - Left Eye    Bevacizumab (AVASTIN) SOLN 1.25 mg  2. Central retinal vein occlusion, left eye, with macular edema  H34.8120     1.  2.  3.  Ophthalmic Meds Ordered this visit:  Meds ordered this encounter  Medications  . Bevacizumab (AVASTIN) SOLN 1.25 mg       Return in about 7 weeks (around 12/09/2019) for dilate, OS, AVASTIN OCT.  Patient Instructions  Patient instructed to contact the office promptly for new visual acuity declines or distortions in either eye    Explained the diagnoses, plan, and follow up with the patient and they expressed understanding.  Patient expressed understanding of the importance of proper follow up care.   Clent Demark Kailiana Granquist [name] Diseases & Surgery of the Retina and Vitreous [facility] [place] 10/21/19     Abbreviations: M myopia (nearsighted); A astigmatism; H hyperopia (farsighted); P presbyopia; Mrx spectacle prescription;  CTL contact lenses; OD right eye; OS left eye; OU both eyes  XT exotropia; ET esotropia; PEK punctate epithelial keratitis; PEE punctate epithelial erosions; DES dry eye syndrome; MGD meibomian gland dysfunction; ATs artificial tears; PFAT's preservative free artificial tears; [place] nuclear sclerotic cataract; PSC posterior subcapsular cataract; ERM epi-retinal membrane; PVD posterior vitreous detachment; RD retinal detachment; DM diabetes mellitus; DR diabetic retinopathy; NPDR non-proliferative diabetic retinopathy; PDR proliferative diabetic retinopathy; CSME clinically significant macular edema; DME diabetic macular edema; dbh dot blot hemorrhages; CWS cotton wool spot; POAG primary open angle glaucoma; C/D cup-to-disc ratio; HVF humphrey visual field; GVF goldmann visual field; OCT optical coherence tomography; IOP intraocular pressure; BRVO Branch retinal vein occlusion; CRVO central retinal vein occlusion; CRAO central retinal artery occlusion; BRAO branch retinal artery  occlusion; RT retinal tear; SB scleral buckle; PPV pars plana vitrectomy; VH Vitreous hemorrhage; PRP panretinal laser photocoagulation; IVK intravitreal kenalog; VMT vitreomacular traction; MH Macular hole;  NVD neovascularization of the disc; NVE neovascularization elsewhere; AREDS age related eye disease study; ARMD age related macular degeneration; POAG primary open angle glaucoma; EBMD epithelial/anterior basement membrane dystrophy; ACIOL anterior chamber intraocular lens; IOL intraocular lens; PCIOL posterior chamber intraocular lens; Phaco/IOL phacoemulsification with intraocular lens placement; [place] photorefractive keratectomy; LASIK laser assisted in situ keratomileusis; HTN hypertension; DM diabetes mellitus; COPD chronic obstructive pulmonary disease

## 2019-10-21 NOTE — Patient Instructions (Signed)
Patient instructed to contact the office promptly for new visual acuity declines or distortions in either eye

## 2019-11-08 ENCOUNTER — Other Ambulatory Visit: Payer: Self-pay | Admitting: Cardiovascular Disease

## 2019-11-11 ENCOUNTER — Other Ambulatory Visit: Payer: Self-pay | Admitting: Cardiology

## 2019-12-10 ENCOUNTER — Encounter (INDEPENDENT_AMBULATORY_CARE_PROVIDER_SITE_OTHER): Payer: Medicare PPO | Admitting: Ophthalmology

## 2019-12-19 ENCOUNTER — Other Ambulatory Visit: Payer: Medicare PPO

## 2019-12-23 ENCOUNTER — Ambulatory Visit: Payer: Medicare PPO

## 2019-12-23 ENCOUNTER — Encounter (INDEPENDENT_AMBULATORY_CARE_PROVIDER_SITE_OTHER): Payer: Self-pay | Admitting: Ophthalmology

## 2019-12-23 ENCOUNTER — Other Ambulatory Visit: Payer: Self-pay

## 2019-12-23 ENCOUNTER — Ambulatory Visit (INDEPENDENT_AMBULATORY_CARE_PROVIDER_SITE_OTHER): Payer: Medicare PPO | Admitting: Ophthalmology

## 2019-12-23 DIAGNOSIS — Z952 Presence of prosthetic heart valve: Secondary | ICD-10-CM

## 2019-12-23 DIAGNOSIS — I350 Nonrheumatic aortic (valve) stenosis: Secondary | ICD-10-CM

## 2019-12-23 DIAGNOSIS — H34812 Central retinal vein occlusion, left eye, with macular edema: Secondary | ICD-10-CM | POA: Diagnosis not present

## 2019-12-23 NOTE — Assessment & Plan Note (Signed)
Chronic focal CME nasal to the fovea left eye, patient reports having difficulty controlling high blood pressure at this time as noted by her cardiologist.  The question is does this medication potentially exacerbate hypertension.  That there are some reports of that particularly in folks with some reduced kidney function so thus we will not deliver intravitreal Avastin today-we will asked the patient to continue to monitor her blood pressure if it improves will have some potential etiology.  That does raise the specter of possible focal laser treatment to this lesion nasal to the fovea left eye

## 2019-12-23 NOTE — Progress Notes (Signed)
12/23/2019     CHIEF COMPLAINT Patient presents for Retina Follow Up   HISTORY OF PRESENT ILLNESS: [name] is a 79 y.o. female who presents to the clinic today for:   HPI    Retina Follow Up    Patient presents with  CRVO/BRVO.  In left eye.  This started 9 weeks ago.  Severity is mild.  Duration of 9 weeks.  Since onset it is stable.          Comments    9 Week F/U OS, poss Avastin OS  Pt denies noticeable changes to [place] OU since last visit. Pt denies ocular pain, flashes of light, or floaters OU.  LBS: 175 this afternoon        Last edited by Rockie Neighbours, Sharon on 12/23/2019  3:22 PM. (History)      Referring physician: Audley Hose, MD [place],  [place] [address]  HISTORICAL INFORMATION:   Selected notes from the MEDICAL RECORD NUMBER    Lab Results  Component Value Date   HGBA1C 7.6 (H) 11/09/2018     CURRENT MEDICATIONS: Current Outpatient Medications (Ophthalmic Drugs)  Medication Sig  . brimonidine (ALPHAGAN) 0.2 % ophthalmic solution   . polyvinyl alcohol (TEARS AGAIN) 1.4 % ophthalmic solution Place 1 drop into both eyes 3 (three) times daily as needed for dry eyes.   No current facility-administered medications for this visit. (Ophthalmic Drugs)   Current Outpatient Medications (Other)  Medication Sig  . acetaminophen (TYLENOL) 325 MG tablet Take 650 mg by mouth every 6 (six) hours as needed for moderate pain or headache.   . allopurinol (ZYLOPRIM) 100 MG tablet Take 50 mg by mouth daily.  Marland Kitchen amLODipine (NORVASC) 10 MG tablet TAKE 1 TABLET BY MOUTH EVERY DAY  . B-D INS SYR ULTRAFINE 1CC/30G 30G X 1/2" 1 ML MISC Inject 1 each into the skin 3 (three) times daily between meals.  . calcitRIOL (ROCALTROL) 0.25 MCG capsule Take 0.25 mcg by mouth daily.   . cetirizine (ZYRTEC) 10 MG tablet Take 10 mg by mouth daily.  . clopidogrel (PLAVIX) 75 MG tablet Take 1 tablet (75 mg total) by mouth daily. Please make yearly appt  with Dr. Angelena Form for November before anymore refills. Thank you 1st attempt  . Desvenlafaxine ER 100 MG TB24 Take 100 mg by mouth daily.  . folic acid (FOLVITE) 1 MG tablet Take 1 mg by mouth daily.  Marland Kitchen HUMALOG KWIKPEN 100 UNIT/ML KwikPen Inject 2-8 Units into the skin 3 (three) times daily before meals.   . insulin glargine (LANTUS) 100 UNIT/ML injection Inject 16 Units into the skin at bedtime.   . isosorbide-hydrALAZINE (BIDIL) 20-37.5 MG tablet Take 2 tablets by mouth 3 (three) times daily.  Marland Kitchen labetalol (NORMODYNE) 100 MG tablet Take 1 tablet (100 mg total) by mouth 2 (two) times daily. (Patient taking differently: Take 200 mg by mouth 2 (two) times daily. )  . leflunomide (ARAVA) 10 MG tablet Take 10 mg by mouth daily.  . niacin (NIASPAN) 500 MG CR tablet Take 500 mg by mouth daily.   . nitroGLYCERIN (NITROSTAT) 0.4 MG SL tablet PLACE 1 TABLET (0.4 MG TOTAL) UNDER THE TONGUE EVERY 5 MINUTES X 3 DOSES AS NEEDED FOR CHEST PAIN.  Marland Kitchen nortriptyline (PAMELOR) 10 MG capsule Take 10 mg by mouth daily.  . ONE TOUCH ULTRA TEST test strip 1 each by Other route 3 (three) times daily between meals.  Marland Kitchen PRISTIQ 100 MG  24 hr tablet Take 100 mg by mouth daily.   . rosuvastatin (CRESTOR) 20 MG tablet Take 20 mg by mouth daily.  Marland Kitchen torsemide (DEMADEX) 20 MG tablet Take 1 tablet (20 mg total) by mouth daily. May take additional dose in the afternoon for swelling, shortness of breath or weight gain. (Patient taking differently: Take 40 mg by mouth daily. May take additional dose in the afternoon for swelling, shortness of breath or weight gain.)   No current facility-administered medications for this visit. (Other)      REVIEW OF SYSTEMS:    ALLERGIES No Known Allergies  PAST MEDICAL HISTORY Past Medical History:  Diagnosis Date  . Arthritis   . Cerebrovascular disease   . CKD (chronic kidney disease)    Sees Dr Hollie Salk  . Coronary artery disease   . Depression   . Diabetes ([place])    INSULIN  DEPENDENT  . Diabetic peripheral neuropathy ([place])   . Diabetic retinopathy ([place])   . Diverticulitis   . GERD (gastroesophageal reflux disease)   . History of CVA (cerebrovascular accident)    x 2 no residulal  . Hyperlipidemia   . Hypertension   . Obesity   . Renal lesion   . S/P TAVR (transcatheter aortic valve replacement) 11/13/2018   s/p TAVR with a 61mm Edwards S3U via the TF approach by Dr. [name] and Dr. Angelena Form  . Severe aortic stenosis    Past Surgical History:  Procedure Laterality Date  . ABDOMINAL HYSTERECTOMY    . AV FISTULA PLACEMENT Left 12/18/2017   Procedure: ARTERIOVENOUS (AV) FISTULA CREATION ARM;  Surgeon: Marty Heck, MD;  Location: [facility];  Service: Vascular;  Laterality: Left;  . bilateral cataract surgery    . CARDIAC CATHETERIZATION  01/07/2014   DR [place] RESECTION  02/1999  . COLONOSCOPY    . COLOSTOMY  02/1999  . COLOSTOMY CLOSURE  07/1999  . EYE SURGERY Left 2019  . FISTULA SUPERFICIALIZATION Left 06/29/2018   Procedure: FISTULA SUPERFICIALIZATION LEFT BRACHIOCEPHALIC;  Surgeon: Marty Heck, MD;  Location: [facility];  Service: Vascular;  Laterality: Left;  . LEFT HEART CATHETERIZATION WITH CORONARY ANGIOGRAM N/A 01/07/2014   Procedure: LEFT HEART CATHETERIZATION WITH CORONARY ANGIOGRAM;  Surgeon: Laverda Page, MD;  Location: [facility] CATH LAB;  Service: Cardiovascular;  Laterality: N/A;  . middle cerebral artery stent placement Right   . OTHER SURGICAL HISTORY     laser surgery  . PTCA  01/07/2014   DES to RCA    DR Einar Gip  . right knee surgery Right    for infection  . RIGHT/LEFT HEART CATH AND CORONARY ANGIOGRAPHY N/A 10/23/2018   Procedure: RIGHT/LEFT HEART CATH AND CORONARY ANGIOGRAPHY;  Surgeon: Nigel Mormon, MD;  Location: [facility] CV LAB;  Service: Cardiovascular;  Laterality: N/A;  . TEE WITHOUT CARDIOVERSION N/A 11/13/2018   Procedure: TRANSESOPHAGEAL ECHOCARDIOGRAM (TEE);  Surgeon: Burnell Blanks, MD;   Location: [facility];  Service: Open Heart Surgery;  Laterality: N/A;  . TRANSCATHETER AORTIC VALVE REPLACEMENT, TRANSFEMORAL  11/13/2018  . TRANSCATHETER AORTIC VALVE REPLACEMENT, TRANSFEMORAL N/A 11/13/2018   Procedure: TRANSCATHETER AORTIC VALVE REPLACEMENT, TRANSFEMORAL;  Surgeon: Burnell Blanks, MD;  Location: [facility];  Service: Open Heart Surgery;  Laterality: N/A;    FAMILY HISTORY Family History  Problem Relation Age of Onset  . Diabetes Mother   . Heart disease Mother   . Prostate cancer Father   . Hypertension Brother   . Prostate cancer Brother  SOCIAL HISTORY Social History   Tobacco Use  . Smoking status: Never Smoker  . Smokeless tobacco: Never Used  Vaping Use  . Vaping Use: Never used  Substance Use Topics  . Alcohol use: No  . Drug use: No         OPHTHALMIC EXAM: Base Eye Exam    Visual Acuity (ETDRS)      Right Left   Dist [place] 20/20 -1 CF at 3'   Dist ph [place]  20/400       Tonometry (Tonopen, 3:22 PM)      Right Left   Pressure 17 18       Pupils      Dark Light Shape React APD   Right 3 2 Round Brisk None   Left 6 6 Irregular Minimal None       Visual Fields (Counting fingers)      Left Right   Restrictions Total superior nasal deficiency Total superior temporal deficiency       Extraocular Movement      Right Left    Full Full       Neuro/Psych    Oriented x3: Yes   Mood/Affect: Normal       Dilation    Left eye: 1.0% Mydriacyl, 2.5% Phenylephrine @ 3:26 PM        Slit Lamp and Fundus Exam    External Exam      Right Left   External Normal Normal       Slit Lamp Exam      Right Left   Lids/Lashes Normal Normal   Conjunctiva/Sclera White and quiet White and quiet   Cornea Clear Clear   Anterior Chamber Deep and quiet Deep and quiet   Iris Round and reactive Round and reactive   Lens Centered posterior chamber intraocular lens Centered anterior chamber intraocular lens   Anterior Vitreous Normal Normal        Fundus Exam      Right Left   Posterior Vitreous  vitrectomized   Disc  Normal   C/D Ratio 0. 0.6   Macula   Cystoid macular edema less, no exudates, Macular thickening, Microaneurysms   Vessels  old CRVO   Periphery  good prp           IMAGING AND PROCEDURES  Imaging and Procedures for 12/23/19  OCT, Retina - OU - Both Eyes       Right Eye Quality was good. Scan locations included subfoveal. Central Foveal Thickness: 254. Progression has been stable. Findings include normal foveal contour.   Left Eye Quality was good. Scan locations included subfoveal. Central Foveal Thickness: 242. Progression has been stable. Findings include abnormal foveal contour, cystoid macular edema.   Notes With focal hotspot nasal to the fovea, sparing the center of the fovea, may treat this as a small rap-like lesion or macular BRVO OS       Color Fundus Photography Optos - OU - Both Eyes       Right Eye Progression has been stable. Disc findings include normal observations. Macula : normal observations. Vessels : normal observations. Periphery : normal observations.   Left Eye Progression has been stable. Disc findings include increased cup to disc ratio, pallor. Macula : microaneurysms.   Notes OS small region nasal to the fovea focal CME with small amount of exudate, this could represent a small twig BRVO as it does not involve the center of the fovea, this also could be a small rap-like  lesion                  ASSESSMENT/PLAN:  Central retinal vein occlusion, left eye, with macular edema Chronic focal CME nasal to the fovea left eye, patient reports having difficulty controlling high blood pressure at this time as noted by her cardiologist.  The question is does this medication potentially exacerbate hypertension.  That there are some reports of that particularly in folks with some reduced kidney function so thus we will not deliver intravitreal Avastin today-we will asked the  patient to continue to monitor her blood pressure if it improves will have some potential etiology.  That does raise the specter of possible focal laser treatment to this lesion nasal to the fovea left eye      ICD-10-CM   1. Central retinal vein occlusion with macular edema of left eye  H34.8120 OCT, Retina - OU - Both Eyes    Color Fundus Photography Optos - OU - Both Eyes  2. Central retinal vein occlusion, left eye, with macular edema  H34.8120     1.  Dilate OS next with an OCT use this to guide focal laser treatment nasal to the fovea.  2.  Off of intravitreal Avastin, patient will continue to monitor and look for signs of improved blood pressure control.  If improvements in blood pressure control do occur on no other change in systemic therapy, intravitreal Avastin may in fact be contributing to resistance of treatment of systemic hypertension  3.  Ophthalmic Meds Ordered this visit:  No orders of the defined types were placed in this encounter.      Return in about 1 week (around 12/30/2019) for dilate, OS, OCT, FOCAL.  There are no Patient Instructions on file for this visit.   Explained the diagnoses, plan, and follow up with the patient and they expressed understanding.  Patient expressed understanding of the importance of proper follow up care.   Clent Demark Lorren Rossetti [name] Diseases & Surgery of the Retina and Vitreous Retina & Diabetic [place] 12/23/19     Abbreviations: M myopia (nearsighted); A astigmatism; H hyperopia (farsighted); P presbyopia; Mrx spectacle prescription;  CTL contact lenses; OD right eye; OS left eye; OU both eyes  XT exotropia; ET esotropia; PEK punctate epithelial keratitis; PEE punctate epithelial erosions; DES dry eye syndrome; MGD meibomian gland dysfunction; ATs artificial tears; PFAT's preservative free artificial tears; [place] nuclear sclerotic cataract; PSC posterior subcapsular cataract; ERM epi-retinal membrane; PVD posterior vitreous  detachment; RD retinal detachment; DM diabetes mellitus; DR diabetic retinopathy; NPDR non-proliferative diabetic retinopathy; PDR proliferative diabetic retinopathy; CSME clinically significant macular edema; DME diabetic macular edema; dbh dot blot hemorrhages; CWS cotton wool spot; POAG primary open angle glaucoma; C/D cup-to-disc ratio; HVF humphrey visual field; GVF goldmann visual field; OCT optical coherence tomography; IOP intraocular pressure; BRVO Branch retinal vein occlusion; CRVO central retinal vein occlusion; CRAO central retinal artery occlusion; BRAO branch retinal artery occlusion; RT retinal tear; SB scleral buckle; PPV pars plana vitrectomy; VH Vitreous hemorrhage; PRP panretinal laser photocoagulation; IVK intravitreal kenalog; VMT vitreomacular traction; MH Macular hole;  NVD neovascularization of the disc; NVE neovascularization elsewhere; AREDS age related eye disease study; ARMD age related macular degeneration; POAG primary open angle glaucoma; EBMD epithelial/anterior basement membrane dystrophy; ACIOL anterior chamber intraocular lens; IOL intraocular lens; PCIOL posterior chamber intraocular lens; Phaco/IOL phacoemulsification with intraocular lens placement; [place] photorefractive keratectomy; LASIK laser assisted in situ keratomileusis; HTN hypertension; DM diabetes mellitus; COPD chronic obstructive pulmonary disease

## 2019-12-30 ENCOUNTER — Encounter (INDEPENDENT_AMBULATORY_CARE_PROVIDER_SITE_OTHER): Payer: Medicare PPO | Admitting: Ophthalmology

## 2020-01-02 ENCOUNTER — Ambulatory Visit: Payer: Medicare PPO | Admitting: Cardiology

## 2020-01-02 ENCOUNTER — Other Ambulatory Visit: Payer: Self-pay

## 2020-01-02 ENCOUNTER — Encounter: Payer: Self-pay | Admitting: Cardiology

## 2020-01-02 VITALS — BP 115/52 | HR 79 | Resp 16 | Ht 63.0 in | Wt 164.2 lb

## 2020-01-02 DIAGNOSIS — I5032 Chronic diastolic (congestive) heart failure: Secondary | ICD-10-CM

## 2020-01-02 DIAGNOSIS — I25118 Atherosclerotic heart disease of native coronary artery with other forms of angina pectoris: Secondary | ICD-10-CM

## 2020-01-02 DIAGNOSIS — Z952 Presence of prosthetic heart valve: Secondary | ICD-10-CM

## 2020-01-02 MED ORDER — AMOXICILLIN 500 MG PO TABS: 2000.0000 mg | ORAL_TABLET | ORAL | 6 refills | Status: DC

## 2020-01-02 NOTE — Progress Notes (Signed)
Follow up visit  Subjective:   [name], female    DOB: 04-28-1940, 79 y.o.   MRN: [id number]    HPI   Chief Complaint  Patient presents with  . Hypertension  . Coronary Artery Disease  . TAVR  . Follow-up    6 month    79 y.o. African American female with hypertension, hyperlipidemia, diabetes with CKD stage III/IV, CAD, h/o strokes (2015, 2020), s/p TAVR for severe AS (11/2018)  She is doing well. She has eben getting intravitreal Avastin injections, which correlated with hypertension. After stopping the injections. Blood pressure are much improved.   Current Outpatient Medications on File Prior to Visit  Medication Sig Dispense Refill  . acetaminophen (TYLENOL) 325 MG tablet Take 650 mg by mouth every 6 (six) hours as needed for moderate pain or headache.     . allopurinol (ZYLOPRIM) 100 MG tablet Take 50 mg by mouth daily.    Marland Kitchen amLODipine (NORVASC) 10 MG tablet TAKE 1 TABLET BY MOUTH EVERY DAY 90 tablet 3  . B-D INS SYR ULTRAFINE 1CC/30G 30G X 1/2" 1 ML MISC Inject 1 each into the skin 3 (three) times daily between meals.    . brimonidine (ALPHAGAN) 0.2 % ophthalmic solution     . calcitRIOL (ROCALTROL) 0.25 MCG capsule Take 0.25 mcg by mouth daily.     . cetirizine (ZYRTEC) 10 MG tablet Take 10 mg by mouth daily.    . clopidogrel (PLAVIX) 75 MG tablet Take 1 tablet (75 mg total) by mouth daily. Please make yearly appt with Dr. Angelena Form for November before anymore refills. Thank you 1st attempt 90 tablet 0  . Desvenlafaxine ER 100 MG TB24 Take 100 mg by mouth daily.    . folic acid (FOLVITE) 1 MG tablet Take 1 mg by mouth daily.    Marland Kitchen HUMALOG KWIKPEN 100 UNIT/ML KwikPen Inject 2-8 Units into the skin 3 (three) times daily before meals.     . insulin glargine (LANTUS) 100 UNIT/ML injection Inject 16 Units into the skin at bedtime.     . isosorbide-hydrALAZINE (BIDIL) 20-37.5 MG tablet Take 2 tablets by mouth 3 (three) times daily. 540 tablet 2  . labetalol (NORMODYNE)  100 MG tablet Take 1 tablet (100 mg total) by mouth 2 (two) times daily. (Patient taking differently: Take 200 mg by mouth 2 (two) times daily. ) 180 tablet 2  . leflunomide (ARAVA) 10 MG tablet Take 10 mg by mouth daily.    . niacin (NIASPAN) 500 MG CR tablet Take 500 mg by mouth daily.     . nitroGLYCERIN (NITROSTAT) 0.4 MG SL tablet PLACE 1 TABLET (0.4 MG TOTAL) UNDER THE TONGUE EVERY 5 MINUTES X 3 DOSES AS NEEDED FOR CHEST PAIN. 25 tablet 1  . nortriptyline (PAMELOR) 10 MG capsule Take 10 mg by mouth daily.    . ONE TOUCH ULTRA TEST test strip 1 each by Other route 3 (three) times daily between meals.    . polyvinyl alcohol (TEARS AGAIN) 1.4 % ophthalmic solution Place 1 drop into both eyes 3 (three) times daily as needed for dry eyes.    Marland Kitchen PRISTIQ 100 MG 24 hr tablet Take 100 mg by mouth daily.     . rosuvastatin (CRESTOR) 20 MG tablet Take 20 mg by mouth daily.    Marland Kitchen torsemide (DEMADEX) 20 MG tablet Take 1 tablet (20 mg total) by mouth daily. May take additional dose in the afternoon for swelling, shortness of breath or weight gain. (Patient  taking differently: Take 40 mg by mouth daily. May take additional dose in the afternoon for swelling, shortness of breath or weight gain.) 180 tablet 0   No current facility-administered medications on file prior to visit.    Cardiovascular & other pertient studies:  Echocardiogram 12/23/2019:  Left ventricle cavity is normal in size. Moderate concentric hypertrophy  of the left ventricle. Normal global wall motion. Normal LV systolic  function with EF 56%. Normal diastolic filling pattern.  Left atrial cavity is mildly dilated.  Well seated [place] Crease Sapien 3 bioprosthetic aortic valve with  normal functioning. Mean PG 11 mmHg. No valvular regurgitation or  paravalvular leak.  Mild (Grade I) mitral regurgitation.  Mild tricuspid regurgitation. Estimated pulmonary artery systolic pressure  31 mmHg.  Mild pulmonic regurgitation.  No  significant change compared to previous study on 12/11/2018.  EKG 01/02/2020: Sinus rhythm 67 bpm Poor R-wave progression Low voltage Nonspecific T-abnormality  Lexiscan (Walking with mod Bruce)Tetrofosmin Stress Test  06/25/2019: Nondiagnostic ECG stress.  The baseline blood pressure was 130/80 mmHg. Maximum blood pressure post injection was 200/70 mmHg, which is a hypertensive response to Lexiscan with Treadmill.  Myocardial perfusion is normal. Overall LV systolic function is normal without regional wall motion abnormalities. Stress LV EF: 61%.  No significant change from 08/06/2018. Low risk.   EKG 05/31/2019: Sinus rhythm 65 bpm Normal EKG   Right and left heart cath 10/23/18:  RA: 4 mmHg RV: 54/9 mmHg PA: 54/17 mmHg, mean PAP 34 mmhg. PCWP: 25 mmHg with tall V wave  CO: 5.2 L/min CI: 3 L/min/m2  LM: Normal LAD: Normal LCx:: Normal RCA: Ostial calcific at least 60% stenosis. Ostial RPDA 20% stenosis.   Right coronary artery was difficult to engage. I had to use multiple catheters, and was finally able to engage with [facility] catheter. there was dampnening of the pressure with catheter engagement. Ostium is at least 60% stenosed with moderate calcification. There was no signifciant improvement with IC NTG. I did not perform FFR in order to limit contrast use in this patient with Cr of 3.0, not on dialysis.  MRA of head 09/10/2018:  1. Acute infarct right occipital lobe. No associated hemorrhage in the infarct. Chronic microhemorrhage right parietal white matter appears separate. 2. Right M1 stent obscures evaluation of the right middle cerebral artery on MRA 3. Right posterior cerebral artery patent. 4. Occluded distal P3 branch on the left.   Carotid artery duplex 12/04/2013: No evidence of hemodynamically significant stenosis in the bilateral carotid bifurcation vessels. There is evidence of minimal heterogeneous plaque in the bilateral carotid artery.  Recent  labs: 10/24/2019: Glucose 122. BUN/Cr 35/2.  05/03/2019:  Glucose 252, BUN/Cr 51/2.69. EGFR 19. Na/K 138/4.2. Rest of the CMP normal. H/H 10.7/33. MCV 90. Platelets 261  Chol 145, TG 154, HDL 45, LDL 74.  NTproBNP 615.   Uric Acid 8.3 (H)  11/09/2018: HbA1C 7.6%  03/06/2018: TSH 1.82 normal   Review of Systems  Cardiovascular: Positive for chest pain. Negative for dyspnea on exertion, leg swelling, palpitations and syncope.        Vitals:   01/02/20 0919  BP: (!) 115/52  Pulse: 79  Resp: 16  SpO2: 98%     Body mass index is 29.09 kg/m. Filed Weights   01/02/20 0919  Weight: 164 lb 3.2 oz (74.5 kg)     Objective:   Physical Exam Vitals and nursing note reviewed.  Constitutional:      General: She is not in acute distress.  Neck:     Vascular: No JVD.  Cardiovascular:     Rate and Rhythm: Normal rate and regular rhythm.     Pulses: Intact distal pulses.          Carotid pulses are on the right side with bruit and on the left side with bruit.      Femoral pulses are 2+ on the right side and 2+ on the left side.      Popliteal pulses are 1+ on the right side and 1+ on the left side.       Dorsalis pedis pulses are 2+ on the right side and 2+ on the left side.       Posterior tibial pulses are 2+ on the right side and 2+ on the left side.     Heart sounds: Murmur heard.   Systolic murmur is present with a grade of 2/6 at the upper right sternal border radiating to the neck.     Comments: Left upper arm AV fistula present  Pulmonary:     Effort: Pulmonary effort is normal.     Breath sounds: Normal breath sounds. No wheezing or rales.           Assessment & Recommendations:   78 y.o. African American female with hypertension, hyperlipidemia, diabetes with CKD stage III/IV, CAD, h/o strokes (2015, 2020), s/p TAVR for severe AS (11/2018)  Hypertension: Controlled after coming off Avastin. Continue current antihypertensive therapy.  S/P TAVR  (transcatheter aortic valve replacement): Normal functioning bioprosthetic aortic valve NYHA class 1 symptoms Given prior strokes, continue plavix  Chronic kidney disease (CKD), stage IV: Follow-up with nephrology nurse this week this  F/u in 1 year   Nigel Mormon, MD [facility] Pager: [phone] Office: [phone]

## 2020-01-03 ENCOUNTER — Other Ambulatory Visit: Payer: Self-pay | Admitting: Nephrology

## 2020-01-03 DIAGNOSIS — D631 Anemia in chronic kidney disease: Secondary | ICD-10-CM

## 2020-01-03 DIAGNOSIS — N2889 Other specified disorders of kidney and ureter: Secondary | ICD-10-CM

## 2020-01-03 DIAGNOSIS — I350 Nonrheumatic aortic (valve) stenosis: Secondary | ICD-10-CM

## 2020-01-03 DIAGNOSIS — I639 Cerebral infarction, unspecified: Secondary | ICD-10-CM

## 2020-01-03 DIAGNOSIS — N184 Chronic kidney disease, stage 4 (severe): Secondary | ICD-10-CM

## 2020-01-03 DIAGNOSIS — N189 Chronic kidney disease, unspecified: Secondary | ICD-10-CM

## 2020-01-03 DIAGNOSIS — N2581 Secondary hyperparathyroidism of renal origin: Secondary | ICD-10-CM

## 2020-01-03 DIAGNOSIS — M109 Gout, unspecified: Secondary | ICD-10-CM

## 2020-01-07 ENCOUNTER — Encounter (INDEPENDENT_AMBULATORY_CARE_PROVIDER_SITE_OTHER): Payer: Self-pay | Admitting: Ophthalmology

## 2020-01-07 ENCOUNTER — Ambulatory Visit (INDEPENDENT_AMBULATORY_CARE_PROVIDER_SITE_OTHER): Payer: Medicare PPO | Admitting: Ophthalmology

## 2020-01-07 ENCOUNTER — Other Ambulatory Visit: Payer: Self-pay

## 2020-01-07 DIAGNOSIS — H34812 Central retinal vein occlusion, left eye, with macular edema: Secondary | ICD-10-CM | POA: Diagnosis not present

## 2020-01-07 DIAGNOSIS — E113512 Type 2 diabetes mellitus with proliferative diabetic retinopathy with macular edema, left eye: Secondary | ICD-10-CM

## 2020-01-07 NOTE — Assessment & Plan Note (Signed)
Focality of CSME noted OS

## 2020-01-07 NOTE — Assessment & Plan Note (Signed)
Focal nasal to the FAZ today for persistent CME, some component of diabetic retinopathy

## 2020-01-07 NOTE — Progress Notes (Signed)
01/07/2020     CHIEF COMPLAINT Patient presents for Retina Follow Up (2 Week Focal OS//Pt denies noticeable changes to [place] OU since last visit. Pt denies ocular pain, flashes of light, or floaters OU. //LBS: 104 this AM/)   HISTORY OF PRESENT ILLNESS: [name] is a 79 y.o. female who presents to the clinic today for:   HPI    Retina Follow Up    Patient presents with  CRVO/BRVO.  In left eye.  This started 2 weeks ago.  Severity is mild.  Duration of 2 weeks.  Since onset it is stable. Additional comments: 2 Week Focal OS  Pt denies noticeable changes to [place] OU since last visit. Pt denies ocular pain, flashes of light, or floaters OU.   LBS: 104 this AM        Last edited by Rockie Neighbours, [place] on 01/07/2020  8:26 AM. (History)      Referring physician: Audley Hose, MD [place],  [place] [address]  HISTORICAL INFORMATION:   Selected notes from the MEDICAL RECORD NUMBER    Lab Results  Component Value Date   HGBA1C 7.6 (H) 11/09/2018     CURRENT MEDICATIONS: Current Outpatient Medications (Ophthalmic Drugs)  Medication Sig  . brimonidine (ALPHAGAN) 0.2 % ophthalmic solution   . polyvinyl alcohol (LIQUIFILM TEARS) 1.4 % ophthalmic solution Place 1 drop into both eyes 3 (three) times daily as needed for dry eyes.   No current facility-administered medications for this visit. (Ophthalmic Drugs)   Current Outpatient Medications (Other)  Medication Sig  . acetaminophen (TYLENOL) 325 MG tablet Take 650 mg by mouth every 6 (six) hours as needed for moderate pain or headache.   . allopurinol (ZYLOPRIM) 100 MG tablet Take 50 mg by mouth daily.  Marland Kitchen amLODipine (NORVASC) 10 MG tablet TAKE 1 TABLET BY MOUTH EVERY DAY  . amoxicillin (AMOXIL) 500 MG tablet Take 4 tablets (2,000 mg total) by mouth as directed. 30-60 min before dental procedure  . B-D INS SYR ULTRAFINE 1CC/30G 30G X 1/2" 1 ML MISC Inject 1 each into the skin 3 (three) times daily  between meals.  . calcitRIOL (ROCALTROL) 0.25 MCG capsule Take 0.25 mcg by mouth daily.   . cetirizine (ZYRTEC) 10 MG tablet Take 10 mg by mouth daily.  . clopidogrel (PLAVIX) 75 MG tablet Take 1 tablet (75 mg total) by mouth daily. Please make yearly appt with Dr. Angelena Form for November before anymore refills. Thank you 1st attempt  . Desvenlafaxine ER 100 MG TB24 Take 100 mg by mouth daily.  . folic acid (FOLVITE) 1 MG tablet Take 1 mg by mouth daily.  Marland Kitchen HUMALOG KWIKPEN 100 UNIT/ML KwikPen Inject 2-8 Units into the skin 3 (three) times daily before meals.   . insulin glargine (LANTUS) 100 UNIT/ML injection Inject 16 Units into the skin at bedtime.   . isosorbide-hydrALAZINE (BIDIL) 20-37.5 MG tablet Take 2 tablets by mouth 3 (three) times daily.  Marland Kitchen labetalol (NORMODYNE) 100 MG tablet Take 1 tablet (100 mg total) by mouth 2 (two) times daily. (Patient taking differently: Take 200 mg by mouth 2 (two) times daily.)  . leflunomide (ARAVA) 10 MG tablet Take 10 mg by mouth daily.  . niacin (NIASPAN) 500 MG CR tablet Take 500 mg by mouth daily.   . nitroGLYCERIN (NITROSTAT) 0.4 MG SL tablet PLACE 1 TABLET (0.4 MG TOTAL) UNDER THE TONGUE EVERY 5 MINUTES X 3 DOSES AS NEEDED FOR CHEST PAIN.  Marland Kitchen  nortriptyline (PAMELOR) 10 MG capsule Take 10 mg by mouth daily.  . ONE TOUCH ULTRA TEST test strip 1 each by Other route 3 (three) times daily between meals.  Marland Kitchen PRISTIQ 100 MG 24 hr tablet Take 100 mg by mouth daily.   . rosuvastatin (CRESTOR) 20 MG tablet Take 20 mg by mouth daily.  Marland Kitchen torsemide (DEMADEX) 20 MG tablet Take 1 tablet (20 mg total) by mouth daily. May take additional dose in the afternoon for swelling, shortness of breath or weight gain. (Patient taking differently: Take 40 mg by mouth daily. May take additional dose in the afternoon for swelling, shortness of breath or weight gain.)   No current facility-administered medications for this visit. (Other)      REVIEW OF SYSTEMS:    ALLERGIES No  Known Allergies  PAST MEDICAL HISTORY Past Medical History:  Diagnosis Date  . Arthritis   . Cerebrovascular disease   . CKD (chronic kidney disease)    Sees Dr Hollie Salk  . Coronary artery disease   . Depression   . Diabetes ([place])    INSULIN DEPENDENT  . Diabetic peripheral neuropathy ([place])   . Diabetic retinopathy ([place])   . Diverticulitis   . GERD (gastroesophageal reflux disease)   . History of CVA (cerebrovascular accident)    x 2 no residulal  . Hyperlipidemia   . Hypertension   . Obesity   . Renal lesion   . S/P TAVR (transcatheter aortic valve replacement) 11/13/2018   s/p TAVR with a 44mm Edwards S3U via the TF approach by Dr. [name] and Dr. Angelena Form  . Severe aortic stenosis    Past Surgical History:  Procedure Laterality Date  . ABDOMINAL HYSTERECTOMY    . AV FISTULA PLACEMENT Left 12/18/2017   Procedure: ARTERIOVENOUS (AV) FISTULA CREATION ARM;  Surgeon: Marty Heck, MD;  Location: [facility];  Service: Vascular;  Laterality: Left;  . bilateral cataract surgery    . CARDIAC CATHETERIZATION  01/07/2014   DR [place] RESECTION  02/1999  . COLONOSCOPY    . COLOSTOMY  02/1999  . COLOSTOMY CLOSURE  07/1999  . EYE SURGERY Left 2019  . FISTULA SUPERFICIALIZATION Left 06/29/2018   Procedure: FISTULA SUPERFICIALIZATION LEFT BRACHIOCEPHALIC;  Surgeon: Marty Heck, MD;  Location: [facility];  Service: Vascular;  Laterality: Left;  . LEFT HEART CATHETERIZATION WITH CORONARY ANGIOGRAM N/A 01/07/2014   Procedure: LEFT HEART CATHETERIZATION WITH CORONARY ANGIOGRAM;  Surgeon: Laverda Page, MD;  Location: [facility] CATH LAB;  Service: Cardiovascular;  Laterality: N/A;  . middle cerebral artery stent placement Right   . OTHER SURGICAL HISTORY     laser surgery  . PTCA  01/07/2014   DES to RCA    DR Einar Gip  . right knee surgery Right    for infection  . RIGHT/LEFT HEART CATH AND CORONARY ANGIOGRAPHY N/A 10/23/2018   Procedure: RIGHT/LEFT HEART CATH AND CORONARY  ANGIOGRAPHY;  Surgeon: Nigel Mormon, MD;  Location: [facility] CV LAB;  Service: Cardiovascular;  Laterality: N/A;  . TEE WITHOUT CARDIOVERSION N/A 11/13/2018   Procedure: TRANSESOPHAGEAL ECHOCARDIOGRAM (TEE);  Surgeon: Burnell Blanks, MD;  Location: [facility];  Service: Open Heart Surgery;  Laterality: N/A;  . TRANSCATHETER AORTIC VALVE REPLACEMENT, TRANSFEMORAL  11/13/2018  . TRANSCATHETER AORTIC VALVE REPLACEMENT, TRANSFEMORAL N/A 11/13/2018   Procedure: TRANSCATHETER AORTIC VALVE REPLACEMENT, TRANSFEMORAL;  Surgeon: Burnell Blanks, MD;  Location: [facility];  Service: Open Heart Surgery;  Laterality: N/A;    FAMILY HISTORY Family History  Problem Relation Age of Onset  . Diabetes Mother   . Heart disease Mother   . Prostate cancer Father   . Hypertension Brother   . Prostate cancer Brother     SOCIAL HISTORY Social History   Tobacco Use  . Smoking status: Never Smoker  . Smokeless tobacco: Never Used  Vaping Use  . Vaping Use: Never used  Substance Use Topics  . Alcohol use: No  . Drug use: No         OPHTHALMIC EXAM:  Base Eye Exam    Visual Acuity (ETDRS)      Right Left   Dist [place] 20/20 -1 CF at 3'   Dist ph [place]  20/400       Tonometry (Tonopen, 8:26 AM)      Right Left   Pressure 15 19       Pupils      Dark Light Shape React APD   Right 4 4 Round Minimal None   Left 5 5 Irregular Minimal None       Visual Fields (Counting fingers)      Left Right   Restrictions Total superior nasal deficiency Total superior temporal deficiency       Extraocular Movement      Right Left    Full Full       Neuro/Psych    Oriented x3: Yes   Mood/Affect: Normal       Dilation    Left eye: 1.0% Mydriacyl, 2.5% Phenylephrine @ 8:29 AM          IMAGING AND PROCEDURES  Imaging and Procedures for 01/07/20  OCT, Retina - OU - Both Eyes       Right Eye Quality was good. Scan locations included subfoveal. Central Foveal Thickness: 252.  Progression has been stable. Findings include normal foveal contour.   Left Eye Quality was good. Scan locations included subfoveal. Central Foveal Thickness: 270. Progression has been stable. Findings include cystoid macular edema, abnormal foveal contour.   Notes CSME located nasal to the foveal avascular zone OS       Focal Laser - OS - Left Eye       Anesthesia Topical anesthesia was used. Anesthetic medications included Proparacaine 0.5%.   Laser Information The type of laser was diode. Color was yellow. The duration in seconds was 0.1. The spot size was 390 microns. Laser power was 60. Total spots was 83.   Post-op The patient tolerated the procedure well. There were no complications. The patient received written and verbal post procedure care education.                 ASSESSMENT/PLAN:  Central retinal vein occlusion, left eye, with macular edema Focal nasal to the FAZ today for persistent CME, some component of diabetic retinopathy  Proliferative diabetic retinopathy of left eye with macular edema associated with type 2 diabetes mellitus (HCC) Focality of CSME noted OS      ICD-10-CM   1. Proliferative diabetic retinopathy of left eye with macular edema associated with type 2 diabetes mellitus (HCC)  T02.4097 Focal Laser - OS - Left Eye  2. Central retinal vein occlusion with macular edema of left eye  H34.8120 OCT, Retina - OU - Both Eyes    Focal Laser - OS - Left Eye  3. Central retinal vein occlusion, left eye, with macular edema  H34.8120     1.  Focal laser delivered nasal to the fovea OD, persistent CSME  2.  3.  Ophthalmic Meds Ordered this visit:  No orders of the defined types were placed in this encounter.      Return in about 4 months (around 05/07/2020) for DILATE OU, OCT.  There are no Patient Instructions on file for this visit.   Explained the diagnoses, plan, and follow up with the patient and they expressed understanding.   Patient expressed understanding of the importance of proper follow up care.   Clent Demark Alandria Butkiewicz [name] Diseases & Surgery of the Retina and Vitreous Retina & Diabetic [place] 01/07/20     Abbreviations: M myopia (nearsighted); A astigmatism; H hyperopia (farsighted); P presbyopia; Mrx spectacle prescription;  CTL contact lenses; OD right eye; OS left eye; OU both eyes  XT exotropia; ET esotropia; PEK punctate epithelial keratitis; PEE punctate epithelial erosions; DES dry eye syndrome; MGD meibomian gland dysfunction; ATs artificial tears; PFAT's preservative free artificial tears; [place] nuclear sclerotic cataract; PSC posterior subcapsular cataract; ERM epi-retinal membrane; PVD posterior vitreous detachment; RD retinal detachment; DM diabetes mellitus; DR diabetic retinopathy; NPDR non-proliferative diabetic retinopathy; PDR proliferative diabetic retinopathy; CSME clinically significant macular edema; DME diabetic macular edema; dbh dot blot hemorrhages; CWS cotton wool spot; POAG primary open angle glaucoma; C/D cup-to-disc ratio; HVF humphrey visual field; GVF goldmann visual field; OCT optical coherence tomography; IOP intraocular pressure; BRVO Branch retinal vein occlusion; CRVO central retinal vein occlusion; CRAO central retinal artery occlusion; BRAO branch retinal artery occlusion; RT retinal tear; SB scleral buckle; PPV pars plana vitrectomy; VH Vitreous hemorrhage; PRP panretinal laser photocoagulation; IVK intravitreal kenalog; VMT vitreomacular traction; MH Macular hole;  NVD neovascularization of the disc; NVE neovascularization elsewhere; AREDS age related eye disease study; ARMD age related macular degeneration; POAG primary open angle glaucoma; EBMD epithelial/anterior basement membrane dystrophy; ACIOL anterior chamber intraocular lens; IOL intraocular lens; PCIOL posterior chamber intraocular lens; Phaco/IOL phacoemulsification with intraocular lens placement; [place] photorefractive  keratectomy; LASIK laser assisted in situ keratomileusis; HTN hypertension; DM diabetes mellitus; COPD chronic obstructive pulmonary disease

## 2020-01-24 ENCOUNTER — Ambulatory Visit: Payer: Medicare PPO | Attending: Internal Medicine

## 2020-01-24 DIAGNOSIS — Z23 Encounter for immunization: Secondary | ICD-10-CM

## 2020-01-24 NOTE — Progress Notes (Signed)
   Covid-19 Vaccination Clinic  Name:  [name]    MRN: [id number] DOB: 1940-07-03  01/24/2020  [name] was observed post Covid-19 immunization for 15 minutes without incident. She was provided with Vaccine Information Sheet and instruction to access the V-Safe system.   [name] was instructed to call 911 with any severe reactions post vaccine: Marland Kitchen Difficulty breathing  . Swelling of face and throat  . A fast heartbeat  . A bad rash all over body  . Dizziness and weakness   Immunizations Administered    Name Date Dose VIS Date Route   Pfizer COVID-19 Vaccine 01/24/2020  2:09 PM 0.3 mL 11/06/2019 Intramuscular   Manufacturer: [place]   Lot: [id number]   NDC: [address]-8

## 2020-01-26 ENCOUNTER — Other Ambulatory Visit: Payer: Self-pay

## 2020-01-26 ENCOUNTER — Ambulatory Visit
Admission: RE | Admit: 2020-01-26 | Discharge: 2020-01-26 | Disposition: A | Discharge status: Home / Self Care | Payer: Medicare PPO | Source: Ambulatory Visit | Attending: Nephrology | Admitting: Nephrology

## 2020-01-26 DIAGNOSIS — I350 Nonrheumatic aortic (valve) stenosis: Secondary | ICD-10-CM

## 2020-01-26 DIAGNOSIS — N2581 Secondary hyperparathyroidism of renal origin: Secondary | ICD-10-CM

## 2020-01-26 DIAGNOSIS — N189 Chronic kidney disease, unspecified: Secondary | ICD-10-CM

## 2020-01-26 DIAGNOSIS — D631 Anemia in chronic kidney disease: Secondary | ICD-10-CM

## 2020-01-26 DIAGNOSIS — N2889 Other specified disorders of kidney and ureter: Secondary | ICD-10-CM

## 2020-01-26 DIAGNOSIS — I639 Cerebral infarction, unspecified: Secondary | ICD-10-CM

## 2020-01-26 DIAGNOSIS — M109 Gout, unspecified: Secondary | ICD-10-CM

## 2020-01-26 DIAGNOSIS — N184 Chronic kidney disease, stage 4 (severe): Secondary | ICD-10-CM

## 2020-02-08 ENCOUNTER — Encounter (HOSPITAL_COMMUNITY): Payer: Self-pay

## 2020-02-08 ENCOUNTER — Inpatient Hospital Stay (HOSPITAL_COMMUNITY)
Admission: EM | Admit: 2020-02-08 | Discharge: 2020-02-17 | DRG: 040 | Disposition: A | Discharge status: Rehabilitation Facility | Payer: Medicare PPO | Attending: Internal Medicine | Admitting: Internal Medicine

## 2020-02-08 ENCOUNTER — Emergency Department (HOSPITAL_COMMUNITY): Payer: Medicare PPO

## 2020-02-08 ENCOUNTER — Other Ambulatory Visit: Payer: Self-pay

## 2020-02-08 DIAGNOSIS — Z7982 Long term (current) use of aspirin: Secondary | ICD-10-CM

## 2020-02-08 DIAGNOSIS — I611 Nontraumatic intracerebral hemorrhage in hemisphere, cortical: Secondary | ICD-10-CM | POA: Diagnosis not present

## 2020-02-08 DIAGNOSIS — K219 Gastro-esophageal reflux disease without esophagitis: Secondary | ICD-10-CM | POA: Diagnosis present

## 2020-02-08 DIAGNOSIS — H53462 Homonymous bilateral field defects, left side: Secondary | ICD-10-CM | POA: Diagnosis present

## 2020-02-08 DIAGNOSIS — Z955 Presence of coronary angioplasty implant and graft: Secondary | ICD-10-CM

## 2020-02-08 DIAGNOSIS — D631 Anemia in chronic kidney disease: Secondary | ICD-10-CM | POA: Diagnosis present

## 2020-02-08 DIAGNOSIS — I5032 Chronic diastolic (congestive) heart failure: Secondary | ICD-10-CM | POA: Diagnosis present

## 2020-02-08 DIAGNOSIS — G4730 Sleep apnea, unspecified: Secondary | ICD-10-CM | POA: Diagnosis present

## 2020-02-08 DIAGNOSIS — I63421 Cerebral infarction due to embolism of right anterior cerebral artery: Secondary | ICD-10-CM

## 2020-02-08 DIAGNOSIS — R29701 NIHSS score 1: Secondary | ICD-10-CM | POA: Diagnosis present

## 2020-02-08 DIAGNOSIS — Z953 Presence of xenogenic heart valve: Secondary | ICD-10-CM

## 2020-02-08 DIAGNOSIS — I130 Hypertensive heart and chronic kidney disease with heart failure and stage 1 through stage 4 chronic kidney disease, or unspecified chronic kidney disease: Secondary | ICD-10-CM | POA: Diagnosis present

## 2020-02-08 DIAGNOSIS — E1122 Type 2 diabetes mellitus with diabetic chronic kidney disease: Secondary | ICD-10-CM | POA: Diagnosis present

## 2020-02-08 DIAGNOSIS — E785 Hyperlipidemia, unspecified: Secondary | ICD-10-CM | POA: Diagnosis present

## 2020-02-08 DIAGNOSIS — I69354 Hemiplegia and hemiparesis following cerebral infarction affecting left non-dominant side: Secondary | ICD-10-CM

## 2020-02-08 DIAGNOSIS — Z833 Family history of diabetes mellitus: Secondary | ICD-10-CM

## 2020-02-08 DIAGNOSIS — Z794 Long term (current) use of insulin: Secondary | ICD-10-CM

## 2020-02-08 DIAGNOSIS — Z79899 Other long term (current) drug therapy: Secondary | ICD-10-CM

## 2020-02-08 DIAGNOSIS — R531 Weakness: Secondary | ICD-10-CM

## 2020-02-08 DIAGNOSIS — I10 Essential (primary) hypertension: Secondary | ICD-10-CM

## 2020-02-08 DIAGNOSIS — I63531 Cerebral infarction due to unspecified occlusion or stenosis of right posterior cerebral artery: Secondary | ICD-10-CM | POA: Diagnosis not present

## 2020-02-08 DIAGNOSIS — I2510 Atherosclerotic heart disease of native coronary artery without angina pectoris: Secondary | ICD-10-CM | POA: Diagnosis present

## 2020-02-08 DIAGNOSIS — I6340 Cerebral infarction due to embolism of unspecified cerebral artery: Secondary | ICD-10-CM | POA: Diagnosis not present

## 2020-02-08 DIAGNOSIS — D696 Thrombocytopenia, unspecified: Secondary | ICD-10-CM | POA: Diagnosis present

## 2020-02-08 DIAGNOSIS — Z20822 Contact with and (suspected) exposure to covid-19: Secondary | ICD-10-CM | POA: Diagnosis present

## 2020-02-08 DIAGNOSIS — E1142 Type 2 diabetes mellitus with diabetic polyneuropathy: Secondary | ICD-10-CM | POA: Diagnosis present

## 2020-02-08 DIAGNOSIS — E11319 Type 2 diabetes mellitus with unspecified diabetic retinopathy without macular edema: Secondary | ICD-10-CM | POA: Diagnosis present

## 2020-02-08 DIAGNOSIS — R778 Other specified abnormalities of plasma proteins: Secondary | ICD-10-CM

## 2020-02-08 DIAGNOSIS — Z7902 Long term (current) use of antithrombotics/antiplatelets: Secondary | ICD-10-CM

## 2020-02-08 DIAGNOSIS — Z8249 Family history of ischemic heart disease and other diseases of the circulatory system: Secondary | ICD-10-CM

## 2020-02-08 DIAGNOSIS — G936 Cerebral edema: Secondary | ICD-10-CM | POA: Diagnosis present

## 2020-02-08 DIAGNOSIS — N183 Chronic kidney disease, stage 3 unspecified: Secondary | ICD-10-CM | POA: Diagnosis present

## 2020-02-08 LAB — BASIC METABOLIC PANEL
Anion gap: 12 (ref 5–15)
BUN: 30 mg/dL — ABNORMAL HIGH (ref 8–23)
CO2: 22 mmol/L (ref 22–32)
Calcium: 9.3 mg/dL (ref 8.9–10.3)
Chloride: 105 mmol/L (ref 98–111)
Creatinine, Ser: 2.44 mg/dL — ABNORMAL HIGH (ref 0.44–1.00)
GFR, Estimated: 19 mL/min — ABNORMAL LOW (ref >60)
Glucose, Bld: 127 mg/dL — ABNORMAL HIGH (ref 70–99)
Potassium: 4.7 mmol/L (ref 3.5–5.1)
Sodium: 139 mmol/L (ref 135–145)

## 2020-02-08 LAB — URINALYSIS, ROUTINE W REFLEX MICROSCOPIC
Bacteria, UA: NONE SEEN
Bilirubin Urine: NEGATIVE
Glucose, UA: 50 mg/dL — AB
Hgb urine dipstick: NEGATIVE
Ketones, ur: NEGATIVE mg/dL
Leukocytes,Ua: NEGATIVE
Nitrite: NEGATIVE
Protein, ur: >=300 mg/dL — AB
Specific Gravity, Urine: 1.01 (ref 1.005–1.030)
pH: 8 (ref 5.0–8.0)

## 2020-02-08 LAB — LIPID PANEL
Cholesterol: 289 mg/dL — ABNORMAL HIGH (ref 0–200)
HDL: 76 mg/dL (ref >40)
LDL Cholesterol: 185 mg/dL — ABNORMAL HIGH (ref 0–99)
Total CHOL/HDL Ratio: 3.8 RATIO
Triglycerides: 140 mg/dL (ref <150)
VLDL: 28 mg/dL (ref 0–40)

## 2020-02-08 LAB — CBC
HCT: 35.9 % — ABNORMAL LOW (ref 36.0–46.0)
Hemoglobin: 10.9 g/dL — ABNORMAL LOW (ref 12.0–15.0)
MCH: 29 pg (ref 26.0–34.0)
MCHC: 30.4 g/dL (ref 30.0–36.0)
MCV: 95.5 fL (ref 80.0–100.0)
Platelets: 92 10*3/uL — ABNORMAL LOW (ref 150–400)
RBC: 3.76 MIL/uL — ABNORMAL LOW (ref 3.87–5.11)
RDW: 13.8 % (ref 11.5–15.5)
WBC: 9.4 10*3/uL (ref 4.0–10.5)
nRBC: 0 % (ref 0.0–0.2)

## 2020-02-08 LAB — RAPID URINE DRUG SCREEN, HOSP PERFORMED
Amphetamines: NOT DETECTED
Barbiturates: NOT DETECTED
Benzodiazepines: NOT DETECTED
Cocaine: NOT DETECTED
Opiates: NOT DETECTED
Tetrahydrocannabinol: NOT DETECTED

## 2020-02-08 LAB — CBG MONITORING, ED
Glucose-Capillary: 129 mg/dL — ABNORMAL HIGH (ref 70–99)
Glucose-Capillary: 155 mg/dL — ABNORMAL HIGH (ref 70–99)
Glucose-Capillary: 134 mg/dL — ABNORMAL HIGH (ref 70–99)
Glucose-Capillary: 300 mg/dL — ABNORMAL HIGH (ref 70–99)

## 2020-02-08 LAB — SARS CORONAVIRUS 2 (TAT 6-24 HRS): SARS Coronavirus 2: NEGATIVE

## 2020-02-08 LAB — TROPONIN I (HIGH SENSITIVITY)
Troponin I (High Sensitivity): 109 ng/L (ref <18)
Troponin I (High Sensitivity): 116 ng/L (ref <18)

## 2020-02-08 MED ORDER — NITROGLYCERIN IN D5W 200-5 MCG/ML-% IV SOLN
0.0000 ug/min | INTRAVENOUS | Status: DC
  Administered 2020-02-08: 5 ug/min via INTRAVENOUS
  Administered 2020-02-09: 90 ug/min via INTRAVENOUS
  Administered 2020-02-09: 115 ug/min via INTRAVENOUS
  Administered 2020-02-09: 55 ug/min via INTRAVENOUS
  Administered 2020-02-10: 120 ug/min via INTRAVENOUS
  Administered 2020-02-10: 130 ug/min via INTRAVENOUS
  Filled 2020-02-08 (×5): qty 250

## 2020-02-08 MED ORDER — ACETAMINOPHEN 325 MG PO TABS
650.0000 mg | ORAL_TABLET | Freq: Four times a day (QID) | ORAL | Status: DC | PRN
  Administered 2020-02-09 – 2020-02-15 (×6): 650 mg via ORAL
  Filled 2020-02-08 (×6): qty 2

## 2020-02-08 MED ORDER — SODIUM CHLORIDE 0.9% FLUSH
3.0000 mL | Freq: Once | INTRAVENOUS | Status: AC
  Administered 2020-02-08: 3 mL via INTRAVENOUS

## 2020-02-08 MED ORDER — CLOPIDOGREL BISULFATE 75 MG PO TABS
75.0000 mg | ORAL_TABLET | Freq: Every day | ORAL | Status: DC
  Administered 2020-02-08 – 2020-02-11 (×4): 75 mg via ORAL
  Filled 2020-02-08 (×3): qty 1

## 2020-02-08 MED ORDER — ACETAMINOPHEN 650 MG RE SUPP: 650.0000 mg | Freq: Four times a day (QID) | RECTAL | Status: DC | PRN

## 2020-02-08 MED ORDER — LORAZEPAM 2 MG/ML IJ SOLN
1.0000 mg | Freq: Once | INTRAMUSCULAR | Status: AC | PRN
  Administered 2020-02-08: 1 mg via INTRAVENOUS
  Filled 2020-02-08: qty 1

## 2020-02-08 MED ORDER — POLYETHYLENE GLYCOL 3350 17 G PO PACK: 17.0000 g | PACK | Freq: Every day | ORAL | Status: DC | PRN

## 2020-02-08 MED ORDER — INSULIN ASPART 100 UNIT/ML ~~LOC~~ SOLN
0.0000 [IU] | Freq: Three times a day (TID) | SUBCUTANEOUS | Status: DC
  Administered 2020-02-08 – 2020-02-09 (×2): 2 [IU] via SUBCUTANEOUS
  Administered 2020-02-09: 3 [IU] via SUBCUTANEOUS
  Administered 2020-02-09 – 2020-02-10 (×2): 2 [IU] via SUBCUTANEOUS
  Administered 2020-02-10 (×2): 3 [IU] via SUBCUTANEOUS
  Administered 2020-02-11: 2 [IU] via SUBCUTANEOUS
  Administered 2020-02-11: 3 [IU] via SUBCUTANEOUS
  Administered 2020-02-12: 5 [IU] via SUBCUTANEOUS
  Administered 2020-02-12 – 2020-02-13 (×2): 2 [IU] via SUBCUTANEOUS
  Administered 2020-02-13 – 2020-02-14 (×2): 3 [IU] via SUBCUTANEOUS
  Administered 2020-02-14 – 2020-02-15 (×2): 2 [IU] via SUBCUTANEOUS
  Administered 2020-02-15 (×2): 3 [IU] via SUBCUTANEOUS
  Administered 2020-02-16: 2 [IU] via SUBCUTANEOUS
  Administered 2020-02-16 – 2020-02-17 (×2): 3 [IU] via SUBCUTANEOUS
  Administered 2020-02-17: 2 [IU] via SUBCUTANEOUS

## 2020-02-08 MED ORDER — INSULIN GLARGINE 100 UNIT/ML ~~LOC~~ SOLN
10.0000 [IU] | Freq: Every day | SUBCUTANEOUS | Status: DC
  Administered 2020-02-08 – 2020-02-16 (×9): 10 [IU] via SUBCUTANEOUS
  Filled 2020-02-08 (×12): qty 0.1

## 2020-02-08 NOTE — ED Provider Notes (Signed)
[facility]PART[facility] Provider Note   CSN: [id number] Arrival date & time: 02/08/20  0601     History Chief Complaint  Patient presents with  . Weakness    [name] is a 80 y.o. female.  Patient with PMH of DM, CKD, CAD, HTN, HL, CVA, aortic valve replacement on plavix, presents to the ED with a chief complaint of left sided weakness.  She states that she noticed the symptoms this morning when she woke up and try to walk to the bathroom.  She required significant assistance and reports that this is unusual for her.  She also reports some numbness and tingling and weakness in her left arm.  She does have some mild persistent weakness from and old stroke, but states that it is not nearly as bad as it is now.  Last known normal at 2300 last night, but does report progression of symptoms this morning.  She reports having mild headache and nausea.  She denies any chest pain or abdominal pain.  The history is provided by the patient. No language interpreter was used.       Past Medical History:  Diagnosis Date  . Arthritis   . Cerebrovascular disease   . CKD (chronic kidney disease)    Sees Dr Hollie Salk  . Coronary artery disease   . Depression   . Diabetes ([place])    INSULIN DEPENDENT  . Diabetic peripheral neuropathy ([place])   . Diabetic retinopathy ([place])   . Diverticulitis   . GERD (gastroesophageal reflux disease)   . History of CVA (cerebrovascular accident)    x 2 no residulal  . Hyperlipidemia   . Hypertension   . Obesity   . Renal lesion   . S/P TAVR (transcatheter aortic valve replacement) 11/13/2018   s/p TAVR with a 72m Edwards S3U via the TF approach by Dr. ORoxy Mannsand Dr. MAngelena Form . Severe aortic stenosis     Patient Active Problem List   Diagnosis Date Noted  . Chronic heart failure with preserved ejection fraction ([place] 01/02/2020  . Atypical chest pain 05/31/2019  . Central retinal vein occlusion, left eye, with macular edema  05/29/2019  . Central retinal vein occlusion of left eye 05/28/2019  . Proliferative diabetic retinopathy of left eye with macular edema associated with type 2 diabetes mellitus ([place] 05/28/2019  . Acute on chronic diastolic heart failure ([place] 11/13/2018  . S/P TAVR (transcatheter aortic valve replacement) 11/13/2018  . Hypertension   . Hyperlipidemia   . Renal lesion   . Severe aortic stenosis 10/23/2018  . Chronic kidney disease (CKD), stage IV (severe) ([place] 12/05/2017  . Coronary artery disease of native artery of native heart with stable angina pectoris ([place] 01/07/2014  . S/P PTCA (percutaneous transluminal coronary angioplasty) 01/07/2014  . History of cerebrovascular accident 01/30/2013    Past Surgical History:  Procedure Laterality Date  . ABDOMINAL HYSTERECTOMY    . AV FISTULA PLACEMENT Left 12/18/2017   Procedure: ARTERIOVENOUS (AV) FISTULA CREATION ARM;  Surgeon: CMarty Heck MD;  [facility]  Service: Vascular;  Laterality: Left;  . bilateral cataract surgery    . CARDIAC CATHETERIZATION  01/07/2014   DR GTwispRESECTION  02/1999  . COLONOSCOPY    . COLOSTOMY  02/1999  . COLOSTOMY CLOSURE  07/1999  . EYE SURGERY Left 2019  . FISTULA SUPERFICIALIZATION Left 06/29/2018   Procedure: FISTULA SUPERFICIALIZATION LEFT BRACHIOCEPHALIC;  Surgeon: CMarty Heck MD;  [facility]  Service: Vascular;  Laterality: Left;  . LEFT HEART CATHETERIZATION WITH CORONARY ANGIOGRAM N/A 01/07/2014   Procedure: LEFT HEART CATHETERIZATION WITH CORONARY ANGIOGRAM;  Surgeon: Laverda Page, MD;  Location: [facility];  Service: Cardiovascular;  Laterality: N/A;  . middle cerebral artery stent placement Right   . OTHER SURGICAL HISTORY     laser surgery  . PTCA  01/07/2014   DES to RCA    DR Einar Gip  . right knee surgery Right    for infection  . RIGHT/LEFT HEART CATH AND CORONARY ANGIOGRAPHY N/A 10/23/2018   Procedure: RIGHT/LEFT HEART CATH AND CORONARY  ANGIOGRAPHY;  Surgeon: Nigel Mormon, MD;  Location: [facility];  Service: Cardiovascular;  Laterality: N/A;  . TEE WITHOUT CARDIOVERSION N/A 11/13/2018   Procedure: TRANSESOPHAGEAL ECHOCARDIOGRAM (TEE);  Surgeon: Burnell Blanks, MD;  Location: [facility];  Service: Open Heart Surgery;  Laterality: N/A;  . TRANSCATHETER AORTIC VALVE REPLACEMENT, TRANSFEMORAL  11/13/2018  . TRANSCATHETER AORTIC VALVE REPLACEMENT, TRANSFEMORAL N/A 11/13/2018   Procedure: TRANSCATHETER AORTIC VALVE REPLACEMENT, TRANSFEMORAL;  Surgeon: Burnell Blanks, MD;  Location: [facility];  Service: Open Heart Surgery;  Laterality: N/A;     OB History   No obstetric history on file.     Family History  Problem Relation Age of Onset  . Diabetes Mother   . Heart disease Mother   . Prostate cancer Father   . Hypertension Brother   . Prostate cancer Brother     Social History   Tobacco Use  . Smoking status: Never Smoker  . Smokeless tobacco: Never Used  Vaping Use  . Vaping Use: Never used  Substance Use Topics  . Alcohol use: No  . Drug use: No    Home Medications Prior to Admission medications   Medication Sig Start Date End Date Taking? Authorizing Provider  acetaminophen (TYLENOL) 325 MG tablet Take 650 mg by mouth every 6 (six) hours as needed for moderate pain or headache.     [provider]  allopurinol (ZYLOPRIM) 100 MG tablet Take 50 mg by mouth daily.    [provider]  amLODipine (NORVASC) 10 MG tablet TAKE 1 TABLET BY MOUTH EVERY DAY 11/11/19   Patwardhan, Manish J, MD  amoxicillin (AMOXIL) 500 MG tablet Take 4 tablets (2,000 mg total) by mouth as directed. 30-60 min before dental procedure 01/02/20   Patwardhan, Reynold Bowen, MD  B-D INS SYR ULTRAFINE 1CC/30G 30G X 1/2" 1 ML MISC Inject 1 each into the skin 3 (three) times daily between meals. 12/18/12   [provider]  brimonidine (ALPHAGAN) 0.2 % ophthalmic solution  04/19/19   [provider]  calcitRIOL (ROCALTROL) 0.25 MCG capsule Take 0.25 mcg by mouth daily.     [provider]  cetirizine (ZYRTEC) 10 MG tablet Take 10 mg by mouth daily.    [provider]  clopidogrel (PLAVIX) 75 MG tablet Take 1 tablet (75 mg total) by mouth daily. Please make yearly appt with Dr. Angelena Form for November before anymore refills. Thank you 1st attempt 11/08/19   Burnell Blanks, MD  Desvenlafaxine ER 100 MG TB24 Take 100 mg by mouth daily.    [provider]  folic acid (FOLVITE) 1 MG tablet Take 1 mg by mouth daily. 12/07/12   [provider]  HUMALOG KWIKPEN 100 UNIT/ML KwikPen Inject 2-8 Units into the skin 3 (three) times daily before meals.  05/28/18   [provider]  insulin glargine (LANTUS) 100 UNIT/ML  injection Inject 16 Units into the skin at bedtime.     [provider]  isosorbide-hydrALAZINE (BIDIL) 20-37.5 MG tablet Take 2 tablets by mouth 3 (three) times daily. 09/16/19   Patwardhan, Reynold Bowen, MD  labetalol (NORMODYNE) 100 MG tablet Take 1 tablet (100 mg total) by mouth 2 (two) times daily. Patient taking differently: Take 200 mg by mouth 2 (two) times daily. 07/03/19   Patwardhan, Reynold Bowen, MD  leflunomide (ARAVA) 10 MG tablet Take 10 mg by mouth daily.    [provider]  niacin (NIASPAN) 500 MG CR tablet Take 500 mg by mouth daily.  01/29/18   [provider]  nitroGLYCERIN (NITROSTAT) 0.4 MG SL tablet PLACE 1 TABLET (0.4 MG TOTAL) UNDER THE TONGUE EVERY 5 MINUTES X 3 DOSES AS NEEDED FOR CHEST PAIN. 01/03/19   Miquel Dunn, NP  nortriptyline (PAMELOR) 10 MG capsule Take 10 mg by mouth daily. 12/17/12   [provider]  ONE TOUCH ULTRA TEST test strip 1 each by Other route 3 (three) times daily between meals. 12/18/12   [provider]  polyvinyl alcohol (LIQUIFILM TEARS) 1.4 % ophthalmic solution Place 1 drop into both eyes 3 (three) times daily as needed for dry eyes.    [provider]  PRISTIQ 100 MG 24 hr tablet Take 100 mg by mouth daily.  12/29/12   [provider]  rosuvastatin (CRESTOR) 20 MG tablet Take 20 mg by mouth daily.    [provider]  torsemide (DEMADEX) 20 MG tablet Take 1 tablet (20 mg total) by mouth daily. May take additional dose in the afternoon for swelling, shortness of breath or weight gain. Patient taking differently: Take 40 mg by mouth daily. May take additional dose in the afternoon for swelling, shortness of breath or weight gain. 09/12/18   Mercy Riding, MD    Allergies    Patient has no known allergies.  Review of Systems   Review of Systems  All other systems reviewed and are negative.   Physical Exam Updated Vital Signs BP (!) 184/59   Pulse 62   Temp 97.7 F (36.5 C) (Oral)   Resp (!) 21   Ht '5\' 3"'$  (1.6 m)   Wt 73.9 kg   SpO2 100%   BMI 28.87 kg/m   Physical Exam Vitals and nursing note reviewed.  Constitutional:      General: She is not in acute distress.    Appearance: She is well-developed and well-nourished.  HENT:     Head: Normocephalic and atraumatic.  Eyes:     Conjunctiva/sclera: Conjunctivae normal.  Cardiovascular:     Rate and Rhythm: Normal rate and regular rhythm.     Heart sounds: No murmur heard.   Pulmonary:     Effort: Pulmonary effort is normal. No respiratory distress.     Breath sounds: Normal breath sounds.  Abdominal:     Palpations: Abdomen is soft.     Tenderness: There is no abdominal tenderness.  Musculoskeletal:        General: No edema. Normal range of motion.     Cervical back: Neck supple.     Comments: RUE 5/5 ROM and strength RLE 4/5 ROM and strength LUE 4/5 strength, 5/5 ROM LLE 3/5 strength, 4/5 ROM  Skin:    General: Skin is warm and dry.  Neurological:     Mental Status: She is alert and oriented to person, place, and time.     Comments: CN 3-12 intact  Speech is clear Movements are goal oriented Did not ambulate due to weakness   Psychiatric:        Mood and Affect: Mood and affect and mood normal.        Behavior: Behavior normal.     ED Results / Procedures / Treatments   Labs (all labs ordered are listed, but only abnormal results are displayed) Labs Reviewed  SARS CORONAVIRUS 2 (TAT 6-24 HRS)  BASIC METABOLIC PANEL  CBC  URINALYSIS, ROUTINE W REFLEX MICROSCOPIC  CBG MONITORING, ED  TROPONIN I (HIGH SENSITIVITY)    EKG EKG Interpretation  Date/Time:  Saturday February 08 2020 06:07:58 EST Ventricular Rate:  64 PR Interval:    QRS Duration: 103 QT Interval:  416 QTC Calculation: 430 R Axis:   43 Text Interpretation: Sinus rhythm Low voltage, precordial leads Borderline T abnormalities, inferior leads No significant change since last tracing Confirmed by Merrily Pew [phone]) on 02/08/2020 7:07:36 AM   Radiology CT HEAD WO CONTRAST  Result Date: 02/08/2020 CLINICAL DATA:  History of prior strokes, acute left hand paresthesias, heavy legs EXAM: CT HEAD WITHOUT CONTRAST TECHNIQUE: Contiguous axial images were obtained from the base of the skull through the vertex without intravenous contrast. COMPARISON:  09/09/2018 FINDINGS: Brain: Chronic encephalomalacia in the central right frontal lobe and the right occipital lobe from remote infarcts. No acute intracranial hemorrhage, area of new infarction, mass lesion, midline shift, herniation or hydrocephalus. No extra-axial fluid collection. No focal mass effect or edema. cisterns are patent. No cerebellar abnormality. Vascular: Intracranial atherosclerosis at the skull base. Chronic right MCA M1 vascular stent again no. Skull: Normal. Negative for fracture or focal lesion. Sinuses/Orbits: No acute finding. Other: None. IMPRESSION: Remote right frontal and right occipital infarcts with encephalomalacia. No acute intracranial finding by noncontrast CT. Electronically Signed   By: Jerilynn Mages.  Shick [name]   On: 02/08/2020 06:53    Procedures Procedures (including critical  care time)  Medications Ordered in ED Medications  sodium chloride flush (NS) 0.9 % injection 3 mL (3 mLs Intravenous Given 02/08/20 [id number])    ED Course  I have reviewed the triage vital signs and the nursing notes.  Pertinent labs & imaging results that were available during my care of the patient were reviewed by me and considered in my medical decision making (see chart for details).    MDM Rules/Calculators/A&P                          This patient complains of left sided weakness and difficulty walking due to weakness, this involves an extensive number of treatment options, and is a complaint that carries with it a high risk of complications and morbidity.    She does have hx of stroke.  LKW last night at 2300, but she has had some progression of symptoms.    Differential Dx Stroke, metabolic process, infection, hypoglycemia  Pertinent Labs I ordered, reviewed, and interpreted labs, which included CBC, CMP, INR, UA.  Labs are notable for troponin of 109, creatinine 2.44 (about baseline), mild anemia of 10.9, also about baseline.  Platelets are 92.  CBG 129.  Imaging Interpretation I ordered imaging studies which included CT head.  I independently visualized and interpreted the CT head, which showed old infarct, but agree with radiology no new infarct obvious.   MRI brain reveals no acute findings.  Medications I ordered medication Ativan for anxiety during MRI.  Sources Additional history obtained from daughter, who states  that patient was significantly more weak than normal this morning.   Critical Interventions  None  Reassessments After the interventions stated above, I reevaluated the patient and found still feeling weak, but without any significant changes.  Consultants Case discussed with Dr. Leonel Ramsay, who recommends MRI brain based on progression of symptoms.  Appreciate internal medicine team for admitting the patient.  Plan Admit    Final Clinical  Impression(s) / ED Diagnoses Final diagnoses:  Weakness  Elevated troponin    Rx / DC Orders ED Discharge Orders    None       Montine Circle, PA-C 02/08/20 1005    Lajean Saver, MD 02/08/20 1424

## 2020-02-08 NOTE — ED Notes (Signed)
Pt transported to MRI 

## 2020-02-08 NOTE — H&P (Addendum)
Date: 02/08/2020               Patient Name:  [name] MRN: [id number]  DOB: [birth year] Age / Sex: 80 y.o., female   PCP: Audley Hose, MD         Medical Service: Internal Medicine Teaching Service         Attending Physician: Dr. Angelica Pou, MD    First Contact: Dr. Johnney Ou Pager: [phone]  Second Contact: Dr. Gilford Rile Pager: [phone]       After Hours (After 5p/  First Contact Pager: [phone]  weekends / holidays): Second Contact Pager: [phone]   Chief Complaint: Weakness  History of Present Illness:  [name] is a 80 y/o F with a PMHx of HTN, HLD, T2DM, CKD Stg III/IV, h/o of  CVA R. MCA 2015 s/p stent with subsequent occlusion, CVA R. PCA 08/2018, multiple TIA's, CAD, TAVR for severe AS 11/2018 for extremity weakness that onset this am. Patient was in her usual state yesterday, without any concerns. She went to bed and woke up with left sided posterior neck pain. She initially thought that she slept wrong. She attempted to get up to go to the bathroom, but she felt progressive weakness on the L side. She notes weakness of both lower extremities, worse on the left. Also notes left upper extremity weakness. She called her daughter to help her to the bathroom, but had difficulty ambulating She was able to smile and talk, with no slurring of her speech. She does have a history of stroke, and her family called EMS. She felt like her body was "heavy." She took her BP at home which was 180/70. When EMS came to the house her pressure was found to be in the 200s. She has never had her pressure below 140 at home. She also notes her family did some tests to look for stroke (facial assymetry, pronator drift, [place] to nose) that was normal at home.  She does endorse light headedness, dizziness, and "like I was blacking out." Denies feeling as though the room was spinning. She had transient chest pain that did not radiate and has now since resolved. Does endorse some  nausea. Poor appetite past several days.   She has had a chronic cough that is not productive. Improved with zyrtec. Denies: vision changes,, abdominal pain, vomiting,  constipation, diarrhea, dysphagia.    Previous TIAs feel like tingling in face, arms, legs. Vaccinated against COVID x2 and boosted Therapist, music)   Lab Orders     SARS CORONAVIRUS 2 (TAT 6-24 HRS) Nasopharyngeal Nasopharyngeal Swab     Basic metabolic panel     CBC     Urinalysis, Routine w reflex microscopic     Urine rapid drug screen (hosp performed)     CBC     Basic metabolic panel     Hemoglobin A1c     Lipid panel     Hemoglobin A1c     CBG monitoring, ED     CBG monitoring, ED     CBG monitoring, ED   Meds:  Current Meds  Medication Sig  . acetaminophen (TYLENOL) 325 MG tablet Take 650 mg by mouth every 6 (six) hours as needed for moderate pain or headache.   . allopurinol (ZYLOPRIM) 100 MG tablet Take 50 mg by mouth daily.  Marland Kitchen amLODipine (NORVASC) 10 MG tablet TAKE 1 TABLET BY MOUTH EVERY DAY (Patient taking differently: Take 10 mg by mouth daily.)  . amoxicillin (AMOXIL)  500 MG tablet Take 4 tablets (2,000 mg total) by mouth as directed. 30-60 min before dental procedure  . B-D INS SYR ULTRAFINE 1CC/30G 30G X 1/2" 1 ML MISC Inject 1 each into the skin 3 (three) times daily between meals.  . calcitRIOL (ROCALTROL) 0.25 MCG capsule Take 0.25 mcg by mouth daily.   . cetirizine (ZYRTEC) 10 MG tablet Take 10 mg by mouth daily as needed for allergies.  Marland Kitchen clopidogrel (PLAVIX) 75 MG tablet Take 1 tablet (75 mg total) by mouth daily. Please make yearly appt with Dr. Angelena Form for November before anymore refills. Thank you 1st attempt (Patient taking differently: Take 75 mg by mouth daily.)  . Desvenlafaxine ER 100 MG TB24 Take 100 mg by mouth daily.  . folic acid (FOLVITE) 1 MG tablet Take 1 mg by mouth daily.  Marland Kitchen HUMALOG KWIKPEN 100 UNIT/ML KwikPen Inject 2-8 Units into the skin 3 (three) times daily before meals.   .  insulin glargine (LANTUS) 100 UNIT/ML injection Inject 13 Units into the skin at bedtime.  . isosorbide-hydrALAZINE (BIDIL) 20-37.5 MG tablet Take 2 tablets by mouth 3 (three) times daily.  Marland Kitchen labetalol (NORMODYNE) 100 MG tablet Take 1 tablet (100 mg total) by mouth 2 (two) times daily. (Patient taking differently: Take 200 mg by mouth 2 (two) times daily.)  . leflunomide (ARAVA) 10 MG tablet Take 10 mg by mouth daily.  . niacin (NIASPAN) 500 MG CR tablet Take 500 mg by mouth daily.   . nortriptyline (PAMELOR) 10 MG capsule Take 10 mg by mouth daily.  . ONE TOUCH ULTRA TEST test strip 1 each by Other route 3 (three) times daily between meals.  . polyvinyl alcohol (LIQUIFILM TEARS) 1.4 % ophthalmic solution Place 1 drop into both eyes 3 (three) times daily as needed for dry eyes.  . rosuvastatin (CRESTOR) 20 MG tablet Take 20 mg by mouth daily.  Marland Kitchen torsemide (DEMADEX) 20 MG tablet Take 1 tablet (20 mg total) by mouth daily. May take additional dose in the afternoon for swelling, shortness of breath or weight gain. (Patient taking differently: Take 40 mg by mouth daily. May take additional dose in the afternoon for swelling, shortness of breath or weight gain.)    Family History:  CV: Open heart surgeries for father, mother, brother. MI brother. No one under 84 y/o  Social History: Denies Tobacco, ETOH, illicit substances  Allergies: Allergies as of 02/08/2020  . (No Known Allergies)   Past Medical History:  Diagnosis Date  . Arthritis   . Cerebrovascular disease   . CKD (chronic kidney disease)    Sees Dr Hollie Salk  . Coronary artery disease   . Depression   . Diabetes ([place])    INSULIN DEPENDENT  . Diabetic peripheral neuropathy ([place])   . Diabetic retinopathy ([place])   . Diverticulitis   . GERD (gastroesophageal reflux disease)   . History of CVA (cerebrovascular accident)    x 2 no residulal  . Hyperlipidemia   . Hypertension   . Obesity   . Renal lesion   . S/P TAVR (transcatheter  aortic valve replacement) 11/13/2018   s/p TAVR with a 74m Edwards S3U via the TF approach by Dr. ORoxy Mannsand Dr. MAngelena Form . Severe aortic stenosis     Review of Systems: A complete ROS was negative except as per HPI.   Physical Exam: Blood pressure (!) 181/67, pulse 76, temperature 97.8 F (36.6 C), resp. rate 16, height '5\' 3"'$  (1.6 m), weight 73.9 kg, SpO2 100 %.  Physical Exam HENT:     Head: Normocephalic and atraumatic.  Eyes:     Comments: Left pupil irregularly shaped, right pupil ERRL  Cardiovascular:     Rate and Rhythm: Normal rate and regular rhythm.     Pulses: Normal pulses.     Heart sounds: Murmur (systolic 2/6, prominent over 2nd r intercostal space) heard.  No friction rub. No gallop.   Pulmonary:     Effort: Pulmonary effort is normal. No respiratory distress.     Breath sounds: Normal breath sounds. No stridor. No wheezing, rhonchi or rales.  Abdominal:     General: Abdomen is flat. Bowel sounds are normal.     Palpations: Abdomen is soft.     Tenderness: There is no abdominal tenderness. There is no guarding or rebound.  Musculoskeletal:     Right lower leg: No edema.     Left lower leg: No edema.  Skin:    General: Skin is warm and dry.  Neurological:     Mental Status: She is oriented to person, place, and time.     Sensory: No sensory deficit.     Motor: Weakness present.     Comments: Left grip strength 4/5, left arm strength 4/5, left lower extremity strength 4/5. Mild right sided facial droop.   Otherwise, facial symmetry, no uvular deviation. No facial numbness. Normal extremity sensation. Normal [place] to nose and heel to shin. No pronator drift.  Psychiatric:        Mood and Affect: Mood normal.        Behavior: Behavior normal.    Labs: CBC    Component Value Date/Time   WBC 9.4 02/08/2020 0624   RBC 3.76 (L) 02/08/2020 0624   HGB 10.9 (L) 02/08/2020 0624   HGB 10.8 (L) 10/16/2018 1123   HCT 35.9 (L) 02/08/2020 0624   HCT 33.0 (L)  10/16/2018 1123   PLT 92 (L) 02/08/2020 0624   PLT 191 10/16/2018 1123   MCV 95.5 02/08/2020 0624   MCV 93 10/16/2018 1123   MCH 29.0 02/08/2020 0624   MCHC 30.4 02/08/2020 0624   RDW 13.8 02/08/2020 0624   RDW 13.3 10/16/2018 1123   LYMPHSABS 1.3 09/09/2018 1639   MONOABS 0.6 09/09/2018 1639   EOSABS 0.2 09/09/2018 1639   BASOSABS 0.0 09/09/2018 1639     CMP     Component Value Date/Time   NA 139 02/08/2020 0624   NA 138 11/21/2018 1408   K 4.7 02/08/2020 0624   CL 105 02/08/2020 0624   CO2 22 02/08/2020 0624   GLUCOSE 127 (H) 02/08/2020 0624   BUN 30 (H) 02/08/2020 0624   BUN 39 (H) 11/21/2018 1408   CREATININE 2.44 (H) 02/08/2020 0624   CALCIUM 9.3 02/08/2020 0624   PROT 6.3 (L) 11/09/2018 1045   ALBUMIN 3.1 (L) 11/09/2018 1045   AST 20 11/09/2018 1045   ALT 12 11/09/2018 1045   ALKPHOS 61 11/09/2018 1045   BILITOT 0.3 11/09/2018 1045   GFRNONAA 19 (L) 02/08/2020 0624   GFRAA 20 (L) 11/21/2018 1408    Imaging: CT HEAD WO CONTRAST  Result Date: 02/08/2020 CLINICAL DATA:  History of prior strokes, acute left hand paresthesias, heavy legs EXAM: CT HEAD WITHOUT CONTRAST TECHNIQUE: Contiguous axial images were obtained from the base of the skull through the vertex without intravenous contrast. COMPARISON:  09/09/2018 FINDINGS: Brain: Chronic encephalomalacia in the central right frontal lobe and the right occipital lobe from remote infarcts. No acute intracranial hemorrhage, area of  new infarction, mass lesion, midline shift, herniation or hydrocephalus. No extra-axial fluid collection. No focal mass effect or edema. cisterns are patent. No cerebellar abnormality. Vascular: Intracranial atherosclerosis at the skull base. Chronic right MCA M1 vascular stent again no. Skull: Normal. Negative for fracture or focal lesion. Sinuses/Orbits: No acute finding. Other: None. IMPRESSION: Remote right frontal and right occipital infarcts with encephalomalacia. No acute intracranial finding  by noncontrast CT. Electronically Signed   By: Jerilynn Mages.  Shick [name]   On: 02/08/2020 06:53   MR BRAIN WO CONTRAST  Result Date: 02/08/2020 CLINICAL DATA:  Tingling and numbness in the left hand. EXAM: MRI HEAD WITHOUT CONTRAST TECHNIQUE: Multiplanar, multiecho pulse sequences of the brain and surrounding structures were obtained without intravenous contrast. COMPARISON:  09/10/2018 FINDINGS: Brain: Moderate remote infarcts in the right superior frontal and occipital lobes. No acute infarct, hemorrhage, hydrocephalus, or masslike finding. Chronic blood products associated with the remote infarcts and the superior left frontal cortex. Vascular: Normal flow voids.  There is a right MCA stent present. Skull and upper cervical spine: Normal marrow signal Sinuses/Orbits: Bilateral cataract resection. Mucosal thickening in the paranasal sinuses. IMPRESSION: 1. No acute finding. 2. Remote right cerebral infarcts. Electronically Signed   By: Monte Fantasia [name]   On: 02/08/2020 09:26    EKG: personally reviewed my interpretation is rate of 65 bpm, normal sinus rhythm. No other abnormalities noted.   Assessment & Plan by Problem: Active Problems:   Weakness   RAIDYN VILLADA is a 80 y.o. with pertinent PMH of HTN, HLD, T2DM, CKD Stg III/IV, h/o of CVA R. MCA 2015 s/p stent with subsequent occlusion, CVA R. PCA 08/2018, multiple TIA's, CAD RCA stent placement, TAVR for severe AS 11/2018 who presented with weakness and admit for further neurological workup on hospital day 0  Weakness Hx of CVA and TIA Patient presents with lower extremity weakness, worse on the left and left upper extremity weakness. Consistent with my physical exam. MRI head w/o contrast negative for any acute findings. Do not suspect ischemic or hemorrhagic etiology at this time. Patient notes improvement of weakness since admission. TIA vs symptomatic severe hypertension. Patient endorses adherence to home plavix therapy. Of note there is a wide  pulse pressure, appears to have had wide pressures in the past. Do not suspect aortic regurgitation occurring with her TAVR, similar murmur documented on prior cardiology note. Last echo 12/23/19 without regurgitation.  Will continue to monitor her neurological symptoms and lower BP. Neurology consulted by ED, pending their recommendations -lower BP, goal of 20-25% MAP over first 24 hours, will start nitro drip -restart home meds once bp stable -continue anticoagulation -speech eval passed -pt/ot, will further evaluate patient's home life tomorrow (ADL's, who she lives with etc) -lipid panel, A1c pending -frequent neurochecks q4h  Hx of TAVR 11/2018 HFpEF Last echo 12/23/19 - LVEF 56%, Aortic valve replacement without regurgitation or leak. Mild mitral and tricuspid regurgitation. Do not believe contributing to wide pulse pressures, appears to be chronic.   CKD Stg III/IV Cr of 2.44 upon admission. Baseline appears to be 2.5-2.9.  -will continue to monitor -avoid nephro toxic medications  Anemia of Chronic Disease Hgb of 10.9 MCV of 95.5. Normocytic anemia. Appears to be baseline for patient.  -daily cbc  CAD Troponemia  Hx of RCA stent placement 12/2013 Patient endorsed transient chest pain that has since resolved. Initially elevated troponin of 109. No acute EKG changes when compared to prior. Repeat has yet to be drawn, will reach  out to nursing staff. Suspect secondary to demand ischemia in setting of hypertension. Will continue to monitor for EKG changes or signs/symptoms of acute ischemia.  -trend troponin x 2 -cardiac monitoring -restart home anticoagulation  Hx T2DM A1C Pending. Last A1c 11/09/18 - 7.6 - SSI during her admission  Diet: Regular Diet VTE: Restart home plavix IVF: None,None Code: Full  Prior to Admission Living Arrangement: Home Anticipated Discharge Location: Uncertain at this time Barriers to Discharge: Further workup/observation  Dispo: Admit  patient to Observation with expected length of stay less than 2 midnights.  Signed: Riesa Pope, MD Internal Medicine Resident PGY-1 Pager: [phone]  02/08/2020, 5:40 PM

## 2020-02-08 NOTE — H&P (Deleted)
Date: 02/08/2020               Patient Name:  [name] MRN: [id number]  DOB: 1940/07/06 Age / Sex: 80 y.o., female   PCP: Audley Hose, MD         Medical Service: Internal Medicine Teaching Service         Attending Physician: Dr. Angelica Pou, MD    First Contact: Dr. Johnney Ou Pager: [phone]  Second Contact: Dr. Gilford Rile Pager: [phone]       After Hours (After 5p/  First Contact Pager: [phone]  weekends / holidays): Second Contact Pager: [phone]   Chief Complaint: Weakness  History of Present Illness:  [name] is a 80 y/o F with a PMHx of HTN, HLD, T2DM, CKD Stg III/IV, h/o of  CVA R. MCA 2015 s/p stent with subsequent occlusion, CVA R. PCA 08/2018, multiple TIA's, CAD, TAVR for severe AS 11/2018 for extremity weakness that onset this am. Patient was in her usual state yesterday, without any concerns. She went to bed and woke up with left sided posterior neck pain. She initially thought that she slept wrong. She attempted to get up to go to the bathroom, but she felt progressive weakness on the L side. She notes weakness of both lower extremities, worse on the left. Also notes left upper extremity weakness. She called her daughter to help her to the bathroom, but had difficulty ambulating She was able to smile and talk, with no slurring of her speech. She does have a history of stroke, and her family called EMS. She felt like her body was "heavy." She took her BP at home which was 180/70. When EMS came to the house her pressure was found to be in the 200s. She has never had her pressure below 140 at home. She also notes her family did some tests to look for stroke (facial assymetry, pronator drift, Sutley to nose) that was normal at home.  She does endorse light headedness, dizziness, and "like I was blacking out." Denies feeling as though the room was spinning. She had transient chest pain that did not radiate and has now since resolved. Does endorse some  nausea. Poor appetite past several days.   She has had a chronic cough that is not productive. Improved with zyrtec. Denies: vision changes,, abdominal pain, vomiting,  constipation, diarrhea, dysphagia.    Previous TIAs feel like tingling in face, arms, legs. Vaccinated against COVID x2 and boosted Therapist, music)   ED Course:   Lab Orders     SARS CORONAVIRUS 2 (TAT 6-24 HRS) Nasopharyngeal Nasopharyngeal Swab     Basic metabolic panel     CBC     Urinalysis, Routine w reflex microscopic     Urine rapid drug screen (hosp performed)     CBC     Basic metabolic panel     Hemoglobin A1c     Lipid panel     Hemoglobin A1c     CBG monitoring, ED     CBG monitoring, ED   Meds:  Current Meds  Medication Sig  . acetaminophen (TYLENOL) 325 MG tablet Take 650 mg by mouth every 6 (six) hours as needed for moderate pain or headache.   . allopurinol (ZYLOPRIM) 100 MG tablet Take 50 mg by mouth daily.  Marland Kitchen amLODipine (NORVASC) 10 MG tablet TAKE 1 TABLET BY MOUTH EVERY DAY (Patient taking differently: Take 10 mg by mouth daily.)  . amoxicillin (AMOXIL) 500 MG tablet  Take 4 tablets (2,000 mg total) by mouth as directed. 30-60 min before dental procedure  . B-D INS SYR ULTRAFINE 1CC/30G 30G X 1/2" 1 ML MISC Inject 1 each into the skin 3 (three) times daily between meals.  . calcitRIOL (ROCALTROL) 0.25 MCG capsule Take 0.25 mcg by mouth daily.   . cetirizine (ZYRTEC) 10 MG tablet Take 10 mg by mouth daily as needed for allergies.  Marland Kitchen clopidogrel (PLAVIX) 75 MG tablet Take 1 tablet (75 mg total) by mouth daily. Please make yearly appt with Dr. Angelena Form for November before anymore refills. Thank you 1st attempt (Patient taking differently: Take 75 mg by mouth daily.)  . Desvenlafaxine ER 100 MG TB24 Take 100 mg by mouth daily.  . folic acid (FOLVITE) 1 MG tablet Take 1 mg by mouth daily.  Marland Kitchen HUMALOG KWIKPEN 100 UNIT/ML KwikPen Inject 2-8 Units into the skin 3 (three) times daily before meals.   . insulin  glargine (LANTUS) 100 UNIT/ML injection Inject 13 Units into the skin at bedtime.  . isosorbide-hydrALAZINE (BIDIL) 20-37.5 MG tablet Take 2 tablets by mouth 3 (three) times daily.  Marland Kitchen labetalol (NORMODYNE) 100 MG tablet Take 1 tablet (100 mg total) by mouth 2 (two) times daily. (Patient taking differently: Take 200 mg by mouth 2 (two) times daily.)  . leflunomide (ARAVA) 10 MG tablet Take 10 mg by mouth daily.  . niacin (NIASPAN) 500 MG CR tablet Take 500 mg by mouth daily.   . nortriptyline (PAMELOR) 10 MG capsule Take 10 mg by mouth daily.  . ONE TOUCH ULTRA TEST test strip 1 each by Other route 3 (three) times daily between meals.  . polyvinyl alcohol (LIQUIFILM TEARS) 1.4 % ophthalmic solution Place 1 drop into both eyes 3 (three) times daily as needed for dry eyes.  . rosuvastatin (CRESTOR) 20 MG tablet Take 20 mg by mouth daily.  Marland Kitchen torsemide (DEMADEX) 20 MG tablet Take 1 tablet (20 mg total) by mouth daily. May take additional dose in the afternoon for swelling, shortness of breath or weight gain. (Patient taking differently: Take 40 mg by mouth daily. May take additional dose in the afternoon for swelling, shortness of breath or weight gain.)    Family History:  CV: Open heart surgeries for father, mother, brother. MI brother. No one under 15 y/o  Social History: Denies Tobacco, ETOH, illicit substances  Allergies: Allergies as of 02/08/2020  . (No Known Allergies)   Past Medical History:  Diagnosis Date  . Arthritis   . Cerebrovascular disease   . CKD (chronic kidney disease)    Sees Dr Hollie Salk  . Coronary artery disease   . Depression   . Diabetes ([place])    INSULIN DEPENDENT  . Diabetic peripheral neuropathy ([place])   . Diabetic retinopathy ([place])   . Diverticulitis   . GERD (gastroesophageal reflux disease)   . History of CVA (cerebrovascular accident)    x 2 no residulal  . Hyperlipidemia   . Hypertension   . Obesity   . Renal lesion   . S/P TAVR (transcatheter aortic  valve replacement) 11/13/2018   s/p TAVR with a 85m Edwards S3U via the TF approach by Dr. ORoxy Mannsand Dr. MAngelena Form . Severe aortic stenosis     Review of Systems: A complete ROS was negative except as per HPI.   Physical Exam: Blood pressure (!) 181/67, pulse 76, temperature 97.8 F (36.6 C), resp. rate 16, height '5\' 3"'$  (1.6 m), weight 73.9 kg, SpO2 100 %. Physical Exam  HENT:     Head: Normocephalic and atraumatic.  Eyes:     Comments: Left pupil irregularly shaped, right pupil ERRL  Cardiovascular:     Rate and Rhythm: Normal rate and regular rhythm.     Pulses: Normal pulses.     Heart sounds: Murmur (systolic 2/6, prominent over 2nd r intercostal space) heard.  No friction rub. No gallop.   Pulmonary:     Effort: Pulmonary effort is normal. No respiratory distress.     Breath sounds: Normal breath sounds. No stridor. No wheezing, rhonchi or rales.  Abdominal:     General: Abdomen is flat. Bowel sounds are normal.     Palpations: Abdomen is soft.     Tenderness: There is no abdominal tenderness. There is no guarding or rebound.  Musculoskeletal:     Right lower leg: No edema.     Left lower leg: No edema.  Skin:    General: Skin is warm and dry.  Neurological:     Mental Status: She is oriented to person, place, and time.     Sensory: No sensory deficit.     Motor: Weakness present.     Comments: Left grip strength 4/5, left arm strength 4/5, left lower extremity strength 4/5. Mild right sided facial droop.   Otherwise, facial symmetry, no uvular deviation. No facial numbness. Normal extremity sensation. Normal Sedler to nose and heel to shin. No pronator drift.  Psychiatric:        Mood and Affect: Mood normal.        Behavior: Behavior normal.    Labs: CBC    Component Value Date/Time   WBC 9.4 02/08/2020 0624   RBC 3.76 (L) 02/08/2020 0624   HGB 10.9 (L) 02/08/2020 0624   HGB 10.8 (L) 10/16/2018 1123   HCT 35.9 (L) 02/08/2020 0624   HCT 33.0 (L) 10/16/2018  1123   PLT 92 (L) 02/08/2020 0624   PLT 191 10/16/2018 1123   MCV 95.5 02/08/2020 0624   MCV 93 10/16/2018 1123   MCH 29.0 02/08/2020 0624   MCHC 30.4 02/08/2020 0624   RDW 13.8 02/08/2020 0624   RDW 13.3 10/16/2018 1123   LYMPHSABS 1.3 09/09/2018 1639   MONOABS 0.6 09/09/2018 1639   EOSABS 0.2 09/09/2018 1639   BASOSABS 0.0 09/09/2018 1639     CMP     Component Value Date/Time   NA 139 02/08/2020 0624   NA 138 11/21/2018 1408   K 4.7 02/08/2020 0624   CL 105 02/08/2020 0624   CO2 22 02/08/2020 0624   GLUCOSE 127 (H) 02/08/2020 0624   BUN 30 (H) 02/08/2020 0624   BUN 39 (H) 11/21/2018 1408   CREATININE 2.44 (H) 02/08/2020 0624   CALCIUM 9.3 02/08/2020 0624   PROT 6.3 (L) 11/09/2018 1045   ALBUMIN 3.1 (L) 11/09/2018 1045   AST 20 11/09/2018 1045   ALT 12 11/09/2018 1045   ALKPHOS 61 11/09/2018 1045   BILITOT 0.3 11/09/2018 1045   GFRNONAA 19 (L) 02/08/2020 0624   GFRAA 20 (L) 11/21/2018 1408    Imaging: CT HEAD WO CONTRAST  Result Date: 02/08/2020 CLINICAL DATA:  History of prior strokes, acute left hand paresthesias, heavy legs EXAM: CT HEAD WITHOUT CONTRAST TECHNIQUE: Contiguous axial images were obtained from the base of the skull through the vertex without intravenous contrast. COMPARISON:  09/09/2018 FINDINGS: Brain: Chronic encephalomalacia in the central right frontal lobe and the right occipital lobe from remote infarcts. No acute intracranial hemorrhage, area of new infarction,  mass lesion, midline shift, herniation or hydrocephalus. No extra-axial fluid collection. No focal mass effect or edema. cisterns are patent. No cerebellar abnormality. Vascular: Intracranial atherosclerosis at the skull base. Chronic right MCA M1 vascular stent again no. Skull: Normal. Negative for fracture or focal lesion. Sinuses/Orbits: No acute finding. Other: None. IMPRESSION: Remote right frontal and right occipital infarcts with encephalomalacia. No acute intracranial finding by  noncontrast CT. Electronically Signed   By: Jerilynn Mages.  Shick [name]   On: 02/08/2020 06:53   MR BRAIN WO CONTRAST  Result Date: 02/08/2020 CLINICAL DATA:  Tingling and numbness in the left hand. EXAM: MRI HEAD WITHOUT CONTRAST TECHNIQUE: Multiplanar, multiecho pulse sequences of the brain and surrounding structures were obtained without intravenous contrast. COMPARISON:  09/10/2018 FINDINGS: Brain: Moderate remote infarcts in the right superior frontal and occipital lobes. No acute infarct, hemorrhage, hydrocephalus, or masslike finding. Chronic blood products associated with the remote infarcts and the superior left frontal cortex. Vascular: Normal flow voids.  There is a right MCA stent present. Skull and upper cervical spine: Normal marrow signal Sinuses/Orbits: Bilateral cataract resection. Mucosal thickening in the paranasal sinuses. IMPRESSION: 1. No acute finding. 2. Remote right cerebral infarcts. Electronically Signed   By: Monte Fantasia [name]   On: 02/08/2020 09:26    EKG: personally reviewed my interpretation is rate of 65 bpm, normal sinus rhythm. No other abnormalities noted.   Assessment & Plan by Problem: Active Problems:   Weakness   [name] is a 80 y.o. with pertinent PMH of HTN, HLD, T2DM, CKD Stg III/IV, h/o of CVA R. MCA 2015 s/p stent with subsequent occlusion, CVA R. PCA 08/2018, multiple TIA's, CAD RCA stent placement, TAVR for severe AS 11/2018 who presented with weakness and admit for further neurological workup on hospital day 0  Weakness Hx of CVA and TIA Patient presents with lower extremity weakness, worse on the left and left upper extremity weakness. Consistent with my physical exam. MRI head w/o contrast negative for any acute findings. Do not suspect ischemic or hemorrhagic etiology at this time. Patient notes improvement of weakness since admission. TIA vs symptomatic severe hypertension. Patient endorses adherence to home plavix therapy. Of note there is a wide  pulse pressure, appears to have had wide pressures in the past. Do not suspect aortic regurgitation occurring with her TAVR, similar murmur documented on prior cardiology note. Last echo 12/23/19 without regurgitation.  Will continue to monitor her neurological symptoms and lower BP. Neurology consulted by ED, pending their recommendations -lower BP, goal of 20-25% MAP over first 24 hours, will start nitro drip -restart home meds once bp stable -continue anticoagulation -speech eval passed -pt/ot, will further evaluate patient's home life tomorrow (ADL's, who she lives with etc) -frequent neurochecks q4h  Hx of TAVR 11/2018 HFpEF Last echo 12/23/19 - LVEF 56%, Aortic valve replacement without regurgitation or leak. Mild mitral and tricuspid regurgitation. Do not believe contributing to wide pulse pressures, appears to be chronic.   CKD Stg III/IV Cr of 2.44 upon admission. Baseline appears to be 2.5-2.9.  -will continue to monitor -avoid nephro toxic medications  Anemia of Chronic Disease Hgb of 10.9 MCV of 95.5. Normocytic anemia. Appears to be baseline for patient.  -daily cbc  CAD Troponemia  Hx of RCA stent placement 12/2013 Patient endorsed transient chest pain that has since resolved. Initially elevated troponin of 109. No acute EKG changes when compared to prior. Repeat has yet to be drawn, will reach out to nursing staff. Suspect secondary  to demand ischemia in setting of hypertension. Will continue to monitor for EKG changes or signs/symptoms of acute ischemia.  -trend troponin x 2 -cardiac monitoring -restart home anticoagulation  Hx T2DM A1C Pending. Last A1c 11/09/18 - 7.6 - SSI during her admission  Diet: Regular Diet VTE: Restart home plavix IVF: None,None Code: Full  Prior to Admission Living Arrangement: Home Anticipated Discharge Location: Uncertain at this time Barriers to Discharge: Further workup/observation  Dispo: Admit patient to Observation with  expected length of stay less than 2 midnights.  Signed: Riesa Pope, MD Internal Medicine Resident PGY-1 Pager: [phone]  02/08/2020, 3:10 PM

## 2020-02-08 NOTE — ED Notes (Signed)
Daughter Angelica Pou [phone] Daughter Velna Hatchet  [phone]

## 2020-02-08 NOTE — ED Notes (Signed)
Patient transported to CT 

## 2020-02-08 NOTE — ED Triage Notes (Signed)
Pt bib EMS from home with c/o of tingling and numbness to LT hand. Pt also states legs felt heavy when trying to get up to go to bathroom. Pt has hx of stroke with slight LT sided weakness at baseline. Pt LKW 2300 last night. Pt also had slight HA and nausea upon waking.

## 2020-02-09 ENCOUNTER — Other Ambulatory Visit: Payer: Self-pay | Admitting: Cardiovascular Disease

## 2020-02-09 DIAGNOSIS — R531 Weakness: Secondary | ICD-10-CM

## 2020-02-09 LAB — BASIC METABOLIC PANEL
Anion gap: 9 (ref 5–15)
BUN: 28 mg/dL — ABNORMAL HIGH (ref 8–23)
CO2: 22 mmol/L (ref 22–32)
Calcium: 8.9 mg/dL (ref 8.9–10.3)
Chloride: 107 mmol/L (ref 98–111)
Creatinine, Ser: 2.38 mg/dL — ABNORMAL HIGH (ref 0.44–1.00)
GFR, Estimated: 20 mL/min — ABNORMAL LOW (ref >60)
Glucose, Bld: 134 mg/dL — ABNORMAL HIGH (ref 70–99)
Potassium: 4.1 mmol/L (ref 3.5–5.1)
Sodium: 138 mmol/L (ref 135–145)

## 2020-02-09 LAB — CBC
HCT: 34.3 % — ABNORMAL LOW (ref 36.0–46.0)
Hemoglobin: 10.8 g/dL — ABNORMAL LOW (ref 12.0–15.0)
MCH: 29.3 pg (ref 26.0–34.0)
MCHC: 31.5 g/dL (ref 30.0–36.0)
MCV: 93 fL (ref 80.0–100.0)
Platelets: 97 10*3/uL — ABNORMAL LOW (ref 150–400)
RBC: 3.69 MIL/uL — ABNORMAL LOW (ref 3.87–5.11)
RDW: 13.6 % (ref 11.5–15.5)
WBC: 7.4 10*3/uL (ref 4.0–10.5)
nRBC: 0 % (ref 0.0–0.2)

## 2020-02-09 LAB — CBG MONITORING, ED
Glucose-Capillary: 155 mg/dL — ABNORMAL HIGH (ref 70–99)
Glucose-Capillary: 145 mg/dL — ABNORMAL HIGH (ref 70–99)

## 2020-02-09 MED ORDER — AMLODIPINE BESYLATE 10 MG PO TABS
10.0000 mg | ORAL_TABLET | Freq: Every day | ORAL | Status: DC
  Administered 2020-02-09 – 2020-02-17 (×9): 10 mg via ORAL
  Filled 2020-02-09 (×2): qty 1
  Filled 2020-02-09 (×2): qty 2
  Filled 2020-02-09 (×5): qty 1

## 2020-02-09 MED ORDER — HYDRALAZINE HCL 20 MG/ML IJ SOLN
5.0000 mg | INTRAMUSCULAR | Status: DC | PRN
  Administered 2020-02-11: 5 mg via INTRAVENOUS
  Filled 2020-02-09: qty 1

## 2020-02-09 MED ORDER — HYDRALAZINE HCL 25 MG PO TABS
25.0000 mg | ORAL_TABLET | Freq: Three times a day (TID) | ORAL | Status: DC
  Administered 2020-02-09 – 2020-02-10 (×4): 25 mg via ORAL
  Filled 2020-02-09 (×4): qty 1

## 2020-02-09 MED ORDER — ROSUVASTATIN CALCIUM 20 MG PO TABS
20.0000 mg | ORAL_TABLET | Freq: Every day | ORAL | Status: DC
  Administered 2020-02-09 – 2020-02-11 (×3): 20 mg via ORAL
  Filled 2020-02-09 (×3): qty 1

## 2020-02-09 NOTE — ED Notes (Signed)
Tele Breakfast order placed 

## 2020-02-09 NOTE — ED Notes (Signed)
Pt asking for Tylenol for headache. Informed Melissa - RN.

## 2020-02-09 NOTE — Progress Notes (Signed)
HD#0 Subjective:  Overnight Events: no acute events overnight    [name] was seen at bedside this morning. She continues to endorse some L sided weakness and tingling sensation. She does endorse a headache this AM. She was able to sleep through the night. She has no other concerns this AM. All questions and concerns addressed at bedside. Her daughter was in the room and had her other daughter on the phone. No questions or concerns from either of them. Patient and family agree with plan.   Objective:  Vital signs in last 24 hours: Vitals:   02/09/20 0300 02/09/20 0400 02/09/20 0451 02/09/20 0500  BP: (!) 184/76 (!) 196/68  (!) 189/67  Pulse: 67 61  (!) 58  Resp: '16 17  15  '$ Temp:   98 F (36.7 C)   TempSrc:   Oral   SpO2: 95% 98%  96%  Weight:      Height:       Supplemental O2: Room Air SpO2: 96 %   Physical Exam:   Constitutional: well-appearing resting in bed comforbtly HENT: normocephalic atraumatic, mucous membranes moist Eyes: conjunctiva non-erythematous, right pupil ERRL. Left pupil with abnormal shape. Neck: supple Cardiovascular: Systolic murmur appreciated, best heard over second right intercostal space. Normal rate and rhythm Pulmonary/Chest: normal work of breathing on room air, lungs clear to auscultation bilaterally Abdominal: soft, non-tender, non-distended. +BS Neurological: alert & oriented x 3, Right sided facial droop. No uvular deviation. Eyes tracking well. Hearing intact. Swallow intact. Upper extremity motor 5/5. No pronator drift. Normal Gaughan to nose. Left lower extremity weakness 4/5. Right lower extremity 5/5 strength. Heel to shin sluggish with left foot. Normal sensation diffusely Skin: warm and dry  Filed Weights   02/08/20 0611  Weight: 73.9 kg   No intake or output data in the 24 hours ending 02/09/20 0619 Net IO Since Admission: No IO data has been entered for this period [02/09/20 0619]  Pertinent Labs: CBC Latest Ref Rng & Units  02/09/2020 02/08/2020 11/14/2018  WBC 4.0 - 10.5 K/uL 7.4 9.4 10.4  Hemoglobin 12.0 - 15.0 g/dL 10.8(L) 10.9(L) 9.9(L)  Hematocrit 36.0 - 46.0 % 34.3(L) 35.9(L) 30.9(L)  Platelets 150 - 400 K/uL 97(L) 92(L) 211    CMP Latest Ref Rng & Units 02/09/2020 02/08/2020 11/21/2018  Glucose 70 - 99 mg/dL 134(H) 127(H) 101(H)  BUN 8 - 23 mg/dL 28(H) 30(H) 39(H)  Creatinine 0.44 - 1.00 mg/dL 2.38(H) 2.44(H) 2.51(H)  Sodium 135 - 145 mmol/L 138 139 138  Potassium 3.5 - 5.1 mmol/L 4.1 4.7 4.5  Chloride 98 - 111 mmol/L 107 105 98  CO2 22 - 32 mmol/L '22 22 27  '$ Calcium 8.9 - 10.3 mg/dL 8.9 9.3 9.6  Total Protein 6.5 - 8.1 g/dL - - -  Total Bilirubin 0.3 - 1.2 mg/dL - - -  Alkaline Phos 38 - 126 U/L - - -  AST 15 - 41 U/L - - -  ALT 0 - 44 U/L - - -    Imaging: CT HEAD WO CONTRAST  Result Date: 02/08/2020 CLINICAL DATA:  History of prior strokes, acute left hand paresthesias, heavy legs EXAM: CT HEAD WITHOUT CONTRAST TECHNIQUE: Contiguous axial images were obtained from the base of the skull through the vertex without intravenous contrast. COMPARISON:  09/09/2018 FINDINGS: Brain: Chronic encephalomalacia in the central right frontal lobe and the right occipital lobe from remote infarcts. No acute intracranial hemorrhage, area of new infarction, mass lesion, midline shift, herniation or hydrocephalus. No  extra-axial fluid collection. No focal mass effect or edema. cisterns are patent. No cerebellar abnormality. Vascular: Intracranial atherosclerosis at the skull base. Chronic right MCA M1 vascular stent again no. Skull: Normal. Negative for fracture or focal lesion. Sinuses/Orbits: No acute finding. Other: None. IMPRESSION: Remote right frontal and right occipital infarcts with encephalomalacia. No acute intracranial finding by noncontrast CT. Electronically Signed   By: Jerilynn Mages.  Shick [name]   On: 02/08/2020 06:53   MR BRAIN WO CONTRAST  Result Date: 02/08/2020 CLINICAL DATA:  Tingling and numbness in the left  hand. EXAM: MRI HEAD WITHOUT CONTRAST TECHNIQUE: Multiplanar, multiecho pulse sequences of the brain and surrounding structures were obtained without intravenous contrast. COMPARISON:  09/10/2018 FINDINGS: Brain: Moderate remote infarcts in the right superior frontal and occipital lobes. No acute infarct, hemorrhage, hydrocephalus, or masslike finding. Chronic blood products associated with the remote infarcts and the superior left frontal cortex. Vascular: Normal flow voids.  There is a right MCA stent present. Skull and upper cervical spine: Normal marrow signal Sinuses/Orbits: Bilateral cataract resection. Mucosal thickening in the paranasal sinuses. IMPRESSION: 1. No acute finding. 2. Remote right cerebral infarcts. Electronically Signed   By: Monte Fantasia [name]   On: 02/08/2020 09:26    Assessment/Plan:   Active Problems:   Weakness   Patient Summary: [name] is a 80 y.o. with pertinent PMH of HTN, HLD, T2DM, CKD Stg III/IV, h/o of CVA R. MCA 2015 s/p stent with subsequent occlusion, CVA R. PCA 08/2018, multiple TIA's, CAD RCA stent placement, TAVR for severe AS 11/2018 who presented with weakness and admit for further neurological workup on hospital day 0  Weakness Likely 2/2 to Elevated Blood Pressure Hx of CVA and TIA Patient continues to endorse L sided weakness, pressures continue to be elevated. LUE appears improved on examination today, with no change in LLE strength. Will continue to titrate up the drip, and restart some of her home medications today. Lipid panel came back with LDL of 185, patient states she has been out of her Crestor for 2 days, but is otherwise compliant. With such an elevated LDL, it is suspected that she may have missed multiple doses over a prolonged period of time.  - Continue Nitro infusion with goal of 140-150 - Restart home amlodipine 10 mg daily - Continue Plavix 75 mg daily  - Speech eval passed: CM diet - pt/ot eval and treat - Q4H Neuro  checks - Restart Crestor 20 mg daily   Hx of TAVR 11/2018 HFpEF Last echo 12/23/19 - LVEF 56%, Aortic valve replacement without regurgitation or leak. Mild mitral and tricuspid regurgitation. Do not believe contributing to wide pulse pressures, appears to be chronic.   CKD Stg III/IV Cr of 2.44 upon admission. Baseline appears to be 2.5-2.9.  -will continue to monitor -avoid nephro toxic medications  CAD Troponemia  Hx of RCA stent placement 12/2013 Troponin trend was flat.   -cardiac monitoring -Plavix 75 mg daily.   Hx T2DM A1C Pending. Last A1c 11/09/18 - 7.6 - SSI during her admission  Anemia of Chronic Disease:  -daily cbc  Diet: Regular Diet VTE: Restart home plavix IVF: None,None Code: Full  Prior to Admission Living Arrangement: Home Anticipated Discharge Location: Uncertain at this time Barriers to Discharge: Further workup/observation  Dispo: Admit patient to Observation with expected length of stay less than 2 midnights.   Diet: Carb-Modified IVF: None,None VTE: Plavix Code: Full Dispo: Anticipated discharge to Home in 2 days pending medical workup.   Vasili  Johnney Ou, [place] Internal Medicine Resident PGY-1 Pager [phone] Please contact the on call pager after 5 pm and on weekends at [phone].

## 2020-02-09 NOTE — ED Notes (Signed)
Pt's CBG result was 155. Informed Melissa - RN.

## 2020-02-09 NOTE — ED Notes (Signed)
Patient is resting comfortably, talking on cell phone to friend. Phone on [employer]. Call bell/remote within reach. Lights dimmed. Pure wick working well.

## 2020-02-10 DIAGNOSIS — I2510 Atherosclerotic heart disease of native coronary artery without angina pectoris: Secondary | ICD-10-CM | POA: Diagnosis not present

## 2020-02-10 DIAGNOSIS — R531 Weakness: Secondary | ICD-10-CM | POA: Diagnosis not present

## 2020-02-10 DIAGNOSIS — E1122 Type 2 diabetes mellitus with diabetic chronic kidney disease: Secondary | ICD-10-CM

## 2020-02-10 DIAGNOSIS — D631 Anemia in chronic kidney disease: Secondary | ICD-10-CM

## 2020-02-10 DIAGNOSIS — D696 Thrombocytopenia, unspecified: Secondary | ICD-10-CM | POA: Diagnosis present

## 2020-02-10 LAB — CBC
HCT: 34.7 % — ABNORMAL LOW (ref 36.0–46.0)
Hemoglobin: 11 g/dL — ABNORMAL LOW (ref 12.0–15.0)
MCH: 29.1 pg (ref 26.0–34.0)
MCHC: 31.7 g/dL (ref 30.0–36.0)
MCV: 91.8 fL (ref 80.0–100.0)
Platelets: 98 10*3/uL — ABNORMAL LOW (ref 150–400)
RBC: 3.78 MIL/uL — ABNORMAL LOW (ref 3.87–5.11)
RDW: 13.3 % (ref 11.5–15.5)
WBC: 7 10*3/uL (ref 4.0–10.5)
nRBC: 0 % (ref 0.0–0.2)

## 2020-02-10 LAB — BASIC METABOLIC PANEL
Anion gap: 8 (ref 5–15)
BUN: 28 mg/dL — ABNORMAL HIGH (ref 8–23)
CO2: 21 mmol/L — ABNORMAL LOW (ref 22–32)
Calcium: 8.9 mg/dL (ref 8.9–10.3)
Chloride: 108 mmol/L (ref 98–111)
Creatinine, Ser: 2.34 mg/dL — ABNORMAL HIGH (ref 0.44–1.00)
GFR, Estimated: 20 mL/min — ABNORMAL LOW (ref >60)
Glucose, Bld: 177 mg/dL — ABNORMAL HIGH (ref 70–99)
Potassium: 4 mmol/L (ref 3.5–5.1)
Sodium: 137 mmol/L (ref 135–145)

## 2020-02-10 LAB — CBG MONITORING, ED
Glucose-Capillary: 147 mg/dL — ABNORMAL HIGH (ref 70–99)
Glucose-Capillary: 163 mg/dL — ABNORMAL HIGH (ref 70–99)
Glucose-Capillary: 171 mg/dL — ABNORMAL HIGH (ref 70–99)

## 2020-02-10 LAB — MRSA PCR SCREENING: MRSA by PCR: NEGATIVE

## 2020-02-10 LAB — GLUCOSE, CAPILLARY
Glucose-Capillary: 163 mg/dL — ABNORMAL HIGH (ref 70–99)
Glucose-Capillary: 121 mg/dL — ABNORMAL HIGH (ref 70–99)

## 2020-02-10 MED ORDER — HYDRALAZINE HCL 50 MG PO TABS
50.0000 mg | ORAL_TABLET | Freq: Three times a day (TID) | ORAL | Status: DC
  Administered 2020-02-10 – 2020-02-17 (×21): 50 mg via ORAL
  Filled 2020-02-10 (×21): qty 1

## 2020-02-10 MED ORDER — LABETALOL HCL 200 MG PO TABS
200.0000 mg | ORAL_TABLET | Freq: Two times a day (BID) | ORAL | Status: DC
  Administered 2020-02-10 – 2020-02-17 (×13): 200 mg via ORAL
  Filled 2020-02-10 (×15): qty 1

## 2020-02-10 MED ORDER — DESVENLAFAXINE ER 100 MG PO TB24: 100.0000 mg | ORAL_TABLET | Freq: Every day | ORAL | Status: DC

## 2020-02-10 MED ORDER — CHLORHEXIDINE GLUCONATE CLOTH 2 % EX PADS
6.0000 | MEDICATED_PAD | Freq: Every day | CUTANEOUS | Status: DC
  Administered 2020-02-10 – 2020-02-17 (×7): 6 via TOPICAL

## 2020-02-10 MED ORDER — VENLAFAXINE HCL ER 75 MG PO CP24
150.0000 mg | ORAL_CAPSULE | Freq: Every day | ORAL | Status: DC
  Administered 2020-02-10 – 2020-02-17 (×8): 150 mg via ORAL
  Filled 2020-02-10: qty 2
  Filled 2020-02-10 (×6): qty 1
  Filled 2020-02-10: qty 2

## 2020-02-10 MED ORDER — TORSEMIDE 20 MG PO TABS
40.0000 mg | ORAL_TABLET | Freq: Every day | ORAL | Status: DC
Start: 2020-02-10 — End: 2020-02-17
  Administered 2020-02-10 – 2020-02-17 (×8): 40 mg via ORAL
  Filled 2020-02-10 (×8): qty 2

## 2020-02-10 MED ORDER — ISOSORBIDE MONONITRATE ER 30 MG PO TB24
30.0000 mg | ORAL_TABLET | Freq: Every day | ORAL | Status: DC
  Administered 2020-02-10 – 2020-02-17 (×8): 30 mg via ORAL
  Filled 2020-02-10 (×8): qty 1

## 2020-02-10 NOTE — ED Notes (Signed)
Patient CBG was 171.

## 2020-02-10 NOTE — ED Notes (Signed)
SDU  Nitro gtt 191mg Breakfast Order Placed

## 2020-02-10 NOTE — Evaluation (Signed)
Physical Therapy Evaluation Patient Details Name: [name] MRN: [id number] DOB: 22-Dec-1940 Today's Date: 02/10/2020   History of Present Illness  Pt is an 80 y/o female admitted secondary to L sided weakness. Also found to be hypertensive. MRI negative. PMH includes CAD, DM, CVA, TAVR, and CKD.  Clinical Impression  Pt admitted secondary to problem above with deficits below. Pt requiring min guard A for short distance ambulation with HHA. Pt reporting LLE feels much better. Educated about using RW to increase safety at home. Pt reports her daughters can assist as needed. Will continue to follow acutely.     Follow Up Recommendations Home health PT;Supervision/Assistance - 24 hour (24/7 initially)    Equipment Recommendations  Rolling walker with 5" wheels    Recommendations for Other Services       Precautions / Restrictions Precautions Precautions: Fall Restrictions Weight Bearing Restrictions: No      Mobility  Bed Mobility Overal bed mobility: Needs Assistance Bed Mobility: Supine to Sit;Sit to Supine     Supine to sit: Min assist Sit to supine: Supervision   General bed mobility comments: Min A For trunk assist to sit up on stretcher.    Transfers Overall transfer level: Needs assistance Equipment used: 1 person hand held assist Transfers: Sit to/from Stand Sit to Stand: Min guard         General transfer comment: Min guard for safety.  Ambulation/Gait Ambulation/Gait assistance: Min guard Gait Distance (Feet): 5 Feet Assistive device: 1 person hand held assist Gait Pattern/deviations: Step-through pattern;Decreased stride length Gait velocity: Decreased   General Gait Details: Guarded gait, but no instability noted. Min guard for safety with HHA. Pt also reporting LLE Felt much more stable.  Stairs            Wheelchair Mobility    Modified Rankin (Stroke Patients Only)       Balance Overall balance assessment: Needs  assistance Sitting-balance support: No upper extremity supported;Feet supported Sitting balance-Leahy Scale: Fair     Standing balance support: Single extremity supported;During functional activity Standing balance-Leahy Scale: Poor Standing balance comment: Reliant on at least 1 UE support                             Pertinent Vitals/Pain Pain Assessment: No/denies pain    Home Living Family/patient expects to be discharged to:: Private residence Living Arrangements: Alone Available Help at Discharge: Family;Available 24 hours/day Type of Home: House Home Access: Stairs to enter Entrance Stairs-Rails: None Entrance Stairs-Number of Steps: 2 Home Layout: Two level Home Equipment: Marine scientist - single point      Prior Function Level of Independence: Independent               Hand Dominance        Extremity/Trunk Assessment   Upper Extremity Assessment Upper Extremity Assessment: Defer to OT evaluation    Lower Extremity Assessment Lower Extremity Assessment: Generalized weakness (reports LLE strength had improved.)    Cervical / Trunk Assessment Cervical / Trunk Assessment: Normal  Communication   Communication: No difficulties  Cognition Arousal/Alertness: Awake/alert Behavior During Therapy: WFL for tasks assessed/performed Overall Cognitive Status: Within Functional Limits for tasks assessed                                        General Comments  Exercises     Assessment/Plan    PT Assessment Patient needs continued PT services  PT Problem List Decreased strength;Decreased balance;Decreased mobility;Decreased knowledge of use of DME       PT Treatment Interventions Gait training;DME instruction;Functional mobility training;Stair training;Therapeutic activities;Therapeutic exercise;Balance training;Patient/family education    PT Goals (Current goals can be found in the Care Plan section)  Acute Rehab PT  Goals Patient Stated Goal: to feel better PT Goal Formulation: With patient Time For Goal Achievement: 02/24/20 Potential to Achieve Goals: Good    Frequency Min 3X/week   Barriers to discharge        Co-evaluation               AM-PAC PT "6 Clicks" Mobility  Outcome Measure Help needed turning from your back to your side while in a flat bed without using bedrails?: A Little Help needed moving from lying on your back to sitting on the side of a flat bed without using bedrails?: A Little Help needed moving to and from a bed to a chair (including a wheelchair)?: A Little Help needed standing up from a chair using your arms (e.g., wheelchair or bedside chair)?: A Little Help needed to walk in hospital room?: A Little Help needed climbing 3-5 steps with a railing? : A Little 6 Click Score: 18    End of Session Equipment Utilized During Treatment: Gait belt Activity Tolerance: Patient tolerated treatment well Patient left: in bed;with call bell/phone within reach (on stretcher in ED) Nurse Communication: Mobility status PT Visit Diagnosis: Unsteadiness on feet (R26.81);Muscle weakness (generalized) (M62.81)    Time: [id number] PT Time Calculation (min) (ACUTE ONLY): 16 min   Charges:   PT Evaluation $PT Eval Low Complexity: 1 Low          Lou Miner, DPT  Acute Rehabilitation Services  Pager: [phone] Office: [phone]   Rudean Hitt 02/10/2020, 10:19 AM

## 2020-02-10 NOTE — Plan of Care (Signed)

## 2020-02-10 NOTE — Progress Notes (Signed)
Subjective:   No acute events overnight.   During evaluation this morning, patient states she is feeling stronger, reports she can move her legs more. States her headache is improved. States she is trying to keep calm to help her blood pressure. Denies shortness of breath, though states occasionally when he IV pole beeps she will start to get a little anxious. States her valve was replaced previously because "it had lived its life."  Objective:  Vital signs in last 24 hours: Vitals:   02/10/20 0330 02/10/20 0539 02/10/20 0540 02/10/20 0615  BP: (!) 157/70 (!) 163/72  (!) 183/57  Pulse: 86   72  Resp: 20   17  Temp:   97.8 F (36.6 C)   TempSrc:   Oral   SpO2: 99%   94%  Weight:      Height:       Supplemental O2: Room Air SpO2: 94 %   Physical Exam:   Constitutional: well-appearing resting in bed comforbtly HENT: normocephalic atraumatic,  Cardiovascular: Systolic murmur appreciated, best heard over second right intercostal space. Normal rate and rhythm Pulmonary/Chest: normal work of breathing on room air, lungs clear to auscultation bilaterally Neurological: alert & oriented x 3, Right sided facial droop.Upper extremity motor 5/5.  Left lower extremity weakness 4/5. Right lower extremity 5/5 strength.Normal sensation diffusely Skin: warm and dry  Filed Weights   02/08/20 0611  Weight: 73.9 kg    Intake/Output Summary (Last 24 hours) at 02/10/2020 0653 Last data filed at 02/09/2020 2304 Gross per 24 hour  Intake 445.91 ml  Output 1650 ml  Net -1204.09 ml   Net IO Since Admission: -1,204.09 mL [02/10/20 0653]  Pertinent Labs: CBC Latest Ref Rng & Units 02/10/2020 02/09/2020 02/08/2020  WBC 4.0 - 10.5 K/uL 7.0 7.4 9.4  Hemoglobin 12.0 - 15.0 g/dL 11.0(L) 10.8(L) 10.9(L)  Hematocrit 36.0 - 46.0 % 34.7(L) 34.3(L) 35.9(L)  Platelets 150 - 400 K/uL 98(L) 97(L) 92(L)    CMP Latest Ref Rng & Units 02/10/2020 02/09/2020 02/08/2020  Glucose 70 - 99 mg/dL 177(H) 134(H)  127(H)  BUN 8 - 23 mg/dL 28(H) 28(H) 30(H)  Creatinine 0.44 - 1.00 mg/dL 2.34(H) 2.38(H) 2.44(H)  Sodium 135 - 145 mmol/L 137 138 139  Potassium 3.5 - 5.1 mmol/L 4.0 4.1 4.7  Chloride 98 - 111 mmol/L 108 107 105  CO2 22 - 32 mmol/L 21(L) 22 22  Calcium 8.9 - 10.3 mg/dL 8.9 8.9 9.3  Total Protein 6.5 - 8.1 g/dL - - -  Total Bilirubin 0.3 - 1.2 mg/dL - - -  Alkaline Phos 38 - 126 U/L - - -  AST 15 - 41 U/L - - -  ALT 0 - 44 U/L - - -    Imaging: No results found.  Assessment/Plan:   Active Problems:   Weakness   Acute left-sided weakness   Patient Summary: [name] is a 80 y.o. with pertinent PMH of HTN, HLD, T2DM, CKD Stg III/IV, h/o of CVA R. MCA 2015 s/p stent with subsequent occlusion, CVA R. PCA 08/2018, multiple TIA's, CAD RCA stent placement, TAVR for severe AS 11/2018 who presented with weakness and admit for further neurological workup on hospital day 0   Weakness Likely 2/2 to Elevated Blood Pressure Hx of CVA and TIA Patient states left lower extremity weakness is improving. BP's continue to be elevated with elevated MAP's. LUE weakness appears resolved. Continue nitro drip and restart home medications of amlodipine, labetalol, and torsemide. With such an  elevated LDL, it is suspected that she may have missed multiple doses over a prolonged period of time.  - Continue Nitro infusion with goal of 140-150 - Restart home medications amlodipine, labetalol, torsemide - Continue Plavix 75 mg daily  - Q4H Neuro checks - Continue Crestor 20 mg daily   Hx of TAVR 11/2018 HFpEF Last echo 12/23/19 - LVEF 56%, Aortic valve replacement without regurgitation or leak. Mild mitral and tricuspid regurgitation. Do not believe contributing to wide pulse pressures, appears to be chronic.   CKD Stg III/IV Cr of 2.34. Baseline appears to be 2.5-2.9.  -will continue to monitor -avoid nephro toxic medications  CAD Troponemia  Hx of RCA stent placement 12/2013 Troponin  trend was flat.   -cardiac monitoring -Plavix 75 mg daily.   Hx T2DM A1C Pending. Last A1c 11/09/18 - 7.6 - SSI during her admission  Anemia of Chronic Disease:  -daily cbc  Diet: Regular Diet VTE: Restart home plavix IVF: None,None Code: Full  Dispo: Admit patient to Observation with expected length of stay less than 2 midnights.  Sanjuana Letters, DO Internal Medicine Resident PGY-1 Pager [phone] Please contact the on call pager after 5 pm and on weekends at [phone].

## 2020-02-11 ENCOUNTER — Observation Stay (HOSPITAL_COMMUNITY): Payer: Medicare PPO

## 2020-02-11 DIAGNOSIS — Z20822 Contact with and (suspected) exposure to covid-19: Secondary | ICD-10-CM | POA: Diagnosis not present

## 2020-02-11 DIAGNOSIS — E876 Hypokalemia: Secondary | ICD-10-CM | POA: Diagnosis present

## 2020-02-11 DIAGNOSIS — E1122 Type 2 diabetes mellitus with diabetic chronic kidney disease: Secondary | ICD-10-CM | POA: Diagnosis present

## 2020-02-11 DIAGNOSIS — T501X5A Adverse effect of loop [high-ceiling] diuretics, initial encounter: Secondary | ICD-10-CM | POA: Diagnosis present

## 2020-02-11 DIAGNOSIS — I639 Cerebral infarction, unspecified: Secondary | ICD-10-CM | POA: Diagnosis not present

## 2020-02-11 DIAGNOSIS — K219 Gastro-esophageal reflux disease without esophagitis: Secondary | ICD-10-CM | POA: Diagnosis present

## 2020-02-11 DIAGNOSIS — I130 Hypertensive heart and chronic kidney disease with heart failure and stage 1 through stage 4 chronic kidney disease, or unspecified chronic kidney disease: Secondary | ICD-10-CM | POA: Diagnosis not present

## 2020-02-11 DIAGNOSIS — Z7902 Long term (current) use of antithrombotics/antiplatelets: Secondary | ICD-10-CM | POA: Diagnosis not present

## 2020-02-11 DIAGNOSIS — D696 Thrombocytopenia, unspecified: Secondary | ICD-10-CM | POA: Diagnosis not present

## 2020-02-11 DIAGNOSIS — I2510 Atherosclerotic heart disease of native coronary artery without angina pectoris: Secondary | ICD-10-CM | POA: Diagnosis present

## 2020-02-11 DIAGNOSIS — R531 Weakness: Secondary | ICD-10-CM | POA: Diagnosis present

## 2020-02-11 DIAGNOSIS — Z952 Presence of prosthetic heart valve: Secondary | ICD-10-CM | POA: Diagnosis not present

## 2020-02-11 DIAGNOSIS — I129 Hypertensive chronic kidney disease with stage 1 through stage 4 chronic kidney disease, or unspecified chronic kidney disease: Secondary | ICD-10-CM | POA: Diagnosis present

## 2020-02-11 DIAGNOSIS — I69354 Hemiplegia and hemiparesis following cerebral infarction affecting left non-dominant side: Secondary | ICD-10-CM | POA: Diagnosis not present

## 2020-02-11 DIAGNOSIS — Z794 Long term (current) use of insulin: Secondary | ICD-10-CM | POA: Diagnosis not present

## 2020-02-11 DIAGNOSIS — Z8042 Family history of malignant neoplasm of prostate: Secondary | ICD-10-CM | POA: Diagnosis not present

## 2020-02-11 DIAGNOSIS — M545 Low back pain, unspecified: Secondary | ICD-10-CM | POA: Diagnosis present

## 2020-02-11 DIAGNOSIS — Z953 Presence of xenogenic heart valve: Secondary | ICD-10-CM | POA: Diagnosis not present

## 2020-02-11 DIAGNOSIS — I63531 Cerebral infarction due to unspecified occlusion or stenosis of right posterior cerebral artery: Secondary | ICD-10-CM | POA: Diagnosis not present

## 2020-02-11 DIAGNOSIS — Z79899 Other long term (current) drug therapy: Secondary | ICD-10-CM | POA: Diagnosis not present

## 2020-02-11 DIAGNOSIS — R5381 Other malaise: Secondary | ICD-10-CM | POA: Diagnosis present

## 2020-02-11 DIAGNOSIS — I161 Hypertensive emergency: Secondary | ICD-10-CM | POA: Diagnosis not present

## 2020-02-11 DIAGNOSIS — M25551 Pain in right hip: Secondary | ICD-10-CM | POA: Diagnosis present

## 2020-02-11 DIAGNOSIS — N184 Chronic kidney disease, stage 4 (severe): Secondary | ICD-10-CM | POA: Diagnosis present

## 2020-02-11 DIAGNOSIS — I611 Nontraumatic intracerebral hemorrhage in hemisphere, cortical: Secondary | ICD-10-CM | POA: Diagnosis not present

## 2020-02-11 DIAGNOSIS — E782 Mixed hyperlipidemia: Secondary | ICD-10-CM | POA: Diagnosis not present

## 2020-02-11 DIAGNOSIS — I5032 Chronic diastolic (congestive) heart failure: Secondary | ICD-10-CM | POA: Diagnosis not present

## 2020-02-11 DIAGNOSIS — E11319 Type 2 diabetes mellitus with unspecified diabetic retinopathy without macular edema: Secondary | ICD-10-CM | POA: Diagnosis present

## 2020-02-11 DIAGNOSIS — N179 Acute kidney failure, unspecified: Secondary | ICD-10-CM | POA: Diagnosis present

## 2020-02-11 DIAGNOSIS — E119 Type 2 diabetes mellitus without complications: Secondary | ICD-10-CM | POA: Diagnosis not present

## 2020-02-11 DIAGNOSIS — G936 Cerebral edema: Secondary | ICD-10-CM | POA: Diagnosis not present

## 2020-02-11 DIAGNOSIS — E1142 Type 2 diabetes mellitus with diabetic polyneuropathy: Secondary | ICD-10-CM | POA: Diagnosis present

## 2020-02-11 DIAGNOSIS — E785 Hyperlipidemia, unspecified: Secondary | ICD-10-CM | POA: Diagnosis present

## 2020-02-11 DIAGNOSIS — H547 Unspecified visual loss: Secondary | ICD-10-CM | POA: Diagnosis present

## 2020-02-11 DIAGNOSIS — I6623 Occlusion and stenosis of bilateral posterior cerebral arteries: Secondary | ICD-10-CM | POA: Diagnosis present

## 2020-02-11 DIAGNOSIS — Z7982 Long term (current) use of aspirin: Secondary | ICD-10-CM | POA: Diagnosis not present

## 2020-02-11 DIAGNOSIS — R29701 NIHSS score 1: Secondary | ICD-10-CM | POA: Diagnosis not present

## 2020-02-11 DIAGNOSIS — I10 Essential (primary) hypertension: Secondary | ICD-10-CM | POA: Diagnosis not present

## 2020-02-11 DIAGNOSIS — G4730 Sleep apnea, unspecified: Secondary | ICD-10-CM | POA: Diagnosis present

## 2020-02-11 DIAGNOSIS — I6389 Other cerebral infarction: Secondary | ICD-10-CM | POA: Diagnosis not present

## 2020-02-11 DIAGNOSIS — Z955 Presence of coronary angioplasty implant and graft: Secondary | ICD-10-CM | POA: Diagnosis not present

## 2020-02-11 DIAGNOSIS — I63421 Cerebral infarction due to embolism of right anterior cerebral artery: Secondary | ICD-10-CM | POA: Diagnosis not present

## 2020-02-11 DIAGNOSIS — Z8673 Personal history of transient ischemic attack (TIA), and cerebral infarction without residual deficits: Secondary | ICD-10-CM | POA: Diagnosis not present

## 2020-02-11 DIAGNOSIS — N183 Chronic kidney disease, stage 3 unspecified: Secondary | ICD-10-CM | POA: Diagnosis present

## 2020-02-11 DIAGNOSIS — D649 Anemia, unspecified: Secondary | ICD-10-CM | POA: Diagnosis present

## 2020-02-11 DIAGNOSIS — D631 Anemia in chronic kidney disease: Secondary | ICD-10-CM | POA: Diagnosis present

## 2020-02-11 DIAGNOSIS — I4891 Unspecified atrial fibrillation: Secondary | ICD-10-CM | POA: Diagnosis present

## 2020-02-11 DIAGNOSIS — D72829 Elevated white blood cell count, unspecified: Secondary | ICD-10-CM | POA: Diagnosis present

## 2020-02-11 LAB — LIPID PANEL
Cholesterol: 296 mg/dL — ABNORMAL HIGH (ref 0–200)
HDL: 67 mg/dL (ref >40)
LDL Cholesterol: 199 mg/dL — ABNORMAL HIGH (ref 0–99)
Total CHOL/HDL Ratio: 4.4 RATIO
Triglycerides: 148 mg/dL (ref <150)
VLDL: 30 mg/dL (ref 0–40)

## 2020-02-11 LAB — HEMOGLOBIN A1C
Hgb A1c MFr Bld: 7.7 % — ABNORMAL HIGH (ref 4.8–5.6)
Mean Plasma Glucose: 174.29 mg/dL

## 2020-02-11 LAB — BASIC METABOLIC PANEL
Anion gap: 11 (ref 5–15)
BUN: 27 mg/dL — ABNORMAL HIGH (ref 8–23)
CO2: 21 mmol/L — ABNORMAL LOW (ref 22–32)
Calcium: 9.1 mg/dL (ref 8.9–10.3)
Chloride: 107 mmol/L (ref 98–111)
Creatinine, Ser: 2.49 mg/dL — ABNORMAL HIGH (ref 0.44–1.00)
GFR, Estimated: 19 mL/min — ABNORMAL LOW (ref >60)
Glucose, Bld: 145 mg/dL — ABNORMAL HIGH (ref 70–99)
Potassium: 4.7 mmol/L (ref 3.5–5.1)
Sodium: 139 mmol/L (ref 135–145)

## 2020-02-11 LAB — CBC
HCT: 36.2 % (ref 36.0–46.0)
Hemoglobin: 11.3 g/dL — ABNORMAL LOW (ref 12.0–15.0)
MCH: 28.6 pg (ref 26.0–34.0)
MCHC: 31.2 g/dL (ref 30.0–36.0)
MCV: 91.6 fL (ref 80.0–100.0)
Platelets: 109 10*3/uL — ABNORMAL LOW (ref 150–400)
RBC: 3.95 MIL/uL (ref 3.87–5.11)
RDW: 13.2 % (ref 11.5–15.5)
WBC: 8.1 10*3/uL (ref 4.0–10.5)
nRBC: 0 % (ref 0.0–0.2)

## 2020-02-11 LAB — GLUCOSE, CAPILLARY
Glucose-Capillary: 136 mg/dL — ABNORMAL HIGH (ref 70–99)
Glucose-Capillary: 136 mg/dL — ABNORMAL HIGH (ref 70–99)
Glucose-Capillary: 147 mg/dL — ABNORMAL HIGH (ref 70–99)
Glucose-Capillary: 155 mg/dL — ABNORMAL HIGH (ref 70–99)
Glucose-Capillary: 135 mg/dL — ABNORMAL HIGH (ref 70–99)

## 2020-02-11 LAB — SAVE SMEAR(SSMR), FOR PROVIDER SLIDE REVIEW

## 2020-02-11 MED ORDER — SODIUM CHLORIDE 0.9 % IV SOLN
50.0000 mL | Freq: Once | INTRAVENOUS | Status: AC
  Administered 2020-02-11: 50 mL via INTRAVENOUS

## 2020-02-11 MED ORDER — LABETALOL HCL 5 MG/ML IV SOLN
5.0000 mg | Freq: Once | INTRAVENOUS | Status: AC
  Administered 2020-02-11: 5 mg via INTRAVENOUS

## 2020-02-11 MED ORDER — ROSUVASTATIN CALCIUM 20 MG PO TABS
40.0000 mg | ORAL_TABLET | Freq: Every day | ORAL | Status: DC
  Administered 2020-02-12 – 2020-02-17 (×6): 40 mg via ORAL
  Filled 2020-02-11 (×5): qty 2

## 2020-02-11 MED ORDER — SODIUM CHLORIDE 0.9 % IV SOLN: INTRAVENOUS | Status: DC

## 2020-02-11 MED ORDER — LORAZEPAM 2 MG/ML IJ SOLN
INTRAMUSCULAR | Status: AC
  Filled 2020-02-11: qty 1

## 2020-02-11 MED ORDER — ALTEPLASE (STROKE) FULL DOSE INFUSION
0.9000 mg/kg | Freq: Once | INTRAVENOUS | Status: AC
  Administered 2020-02-11: 63.8 mg via INTRAVENOUS
  Filled 2020-02-11: qty 100

## 2020-02-11 MED ORDER — LORAZEPAM 2 MG/ML IJ SOLN: 0.5000 mg | Freq: Once | INTRAMUSCULAR | Status: DC

## 2020-02-11 NOTE — Progress Notes (Signed)
Subjective:   No acute events overnight.   Rapid nurses at bedside. They updated [name] that patient was having increased weakness of left upper and lower extremity while eating breakfast. Quick assessment by RN revealed left sided weakness different than prior exam. Code stroke was called and MRI performed revealing acute infarction and tPA was administered.   Patient resting in bed with daughter at bedside and family member on the phone. Patient stated that she was drinking her coffee and felt her left arm and leg become "heavy." She denies any changes in her vision, difficulty swallowing, tingling in her extremities. Denied feeling confused. It was explained that per imaging she had a new stroke and that tPA was given to her. She voiced understanding.   Family enquired about if these new symptoms were permanent and it was explained to them that it is too early to tell if the weakness will improve with the clot busting medication and we will only know with time. The blood pressure was also enquired about and it was also explained that during her hospitalization we did not want to drop her blood pressure quickly or too slowly and that she was making good progress and having regression of her initial weakness that was thought to be secondary to her blood pressure.   No other questions or concerns from the patient or family, Neuro NP at the bedside.   Objective:  Vital signs in last 24 hours: Vitals:   02/11/20 0300 02/11/20 0400 02/11/20 0415 02/11/20 0500  BP: (!) 149/48 (!) 155/58  (!) 158/53  Pulse: 60 62  (!) 56  Resp: 20 (!) 23  15  Temp:   98.2 F (36.8 C)   TempSrc:   Oral   SpO2: 97% 100%  97%  Weight:   70.9 kg   Height:       Supplemental O2: Room Air SpO2: 97 %   Physical Exam:   Constitutional: well-appearing resting in bed comforbtly HENT: normocephalic atraumatic. Right pupil ERRL. Left pupil abnormal pupil.  Cardiovascular: Systolic murmur appreciated, best heard  over second right intercostal space. Normal rate and rhythm Pulmonary/Chest: normal work of breathing on room air, lungs clear to auscultation bilaterally Neurological: alert & oriented x 3. Normal facial sensation. Facial movement symmetric, right sided facial droop. No uvular deviation. Normal extremity sensation. Weakness of left lower extremity 3-4/5. Upper extremities without weakness. Normal [place] to nose. Abnormal left foot heel to shin. Skin: warm and dry  Filed Weights   02/08/20 0611 02/11/20 0415  Weight: 73.9 kg 70.9 kg    Intake/Output Summary (Last 24 hours) at 02/11/2020 0533 Last data filed at 02/11/2020 0415 Gross per 24 hour  Intake 649.64 ml  Output 1350 ml  Net -700.36 ml   Net IO Since Admission: -1,904.45 mL [02/11/20 0533]  Pertinent Labs: CBC Latest Ref Rng & Units 02/11/2020 02/10/2020 02/09/2020  WBC 4.0 - 10.5 K/uL 8.1 7.0 7.4  Hemoglobin 12.0 - 15.0 g/dL 11.3(L) 11.0(L) 10.8(L)  Hematocrit 36.0 - 46.0 % 36.2 34.7(L) 34.3(L)  Platelets 150 - 400 K/uL 109(L) 98(L) 97(L)    CMP Latest Ref Rng & Units 02/11/2020 02/10/2020 02/09/2020  Glucose 70 - 99 mg/dL 145(H) 177(H) 134(H)  BUN 8 - 23 mg/dL 27(H) 28(H) 28(H)  Creatinine 0.44 - 1.00 mg/dL 2.49(H) 2.34(H) 2.38(H)  Sodium 135 - 145 mmol/L 139 137 138  Potassium 3.5 - 5.1 mmol/L 4.7 4.0 4.1  Chloride 98 - 111 mmol/L 107 108 107  CO2 22 -  32 mmol/L 21(L) 21(L) 22  Calcium 8.9 - 10.3 mg/dL 9.1 8.9 8.9  Total Protein 6.5 - 8.1 g/dL - - -  Total Bilirubin 0.3 - 1.2 mg/dL - - -  Alkaline Phos 38 - 126 U/L - - -  AST 15 - 41 U/L - - -  ALT 0 - 44 U/L - - -    Imaging: No results found.  Assessment/Plan:   Active Problems:   Weakness   Acute left-sided weakness   Thrombocytopenia (HCC)   Patient Summary: [name] is a 80 y.o. with pertinent PMH of HTN, HLD, T2DM, CKD Stg III/IV, h/o of CVA R. MCA 2015 s/p stent with subsequent occlusion, CVA R. PCA 08/2018, multiple TIA's, CAD RCA stent placement,  TAVR for severe AS 11/2018 who presented with weakness and admit for further neurological workup  Weakness Likely 2/2 to Elevated Blood Pressure & New Acute Infarct  Hx of CVA and TIA Blood pressures and weakness were improving over the past day. This morning patient with acute worsening of her weakness, code stroke called by RN. MRI with evidence of 45m area of acute infarct in right frontal parietal cortex. Neurology following and ordered tPA. Upon exam, patient with worsening weakness of left lower extremity compared to past few days. Neurology recommendations as per below.  -MRI at 24 hrs s/p tPA -strict BP parameters - <180/105 24 hrs post tPA -frequent neuro checks - STAT Head CT -no echo, patient had one earlier December -start aspirin 24 hrs post tPA for 3 weeks, continue plavix 75 mg daily -PT/OT/Speech -continue home medications amlodipine, labetalol, torsemide -continue imdur -continue Crestor 20 mg daily, elevated LDL and total cholesterol. Patient endorses compliance with crestor.  -consider longer term rhythm monitoring, will review tele EKG NSR this morning  Thrombocytopenia On admission, patient with plt 90's. Appears new since labs 1 yr ago. Uncertain of etiology at this point. Pending blood smear.  - Pending blood smear - Consider immature platelet fraction  Hx of TAVR 11/2018 HFpEF Last echo 12/23/19 - LVEF 56%, Aortic valve replacement without regurgitation or leak. Mild mitral and tricuspid regurgitation. Kennedy not believe contributing to wide pulse pressures, appears to be chronic.   CKD Stg III/IV Cr of 2.49. Baseline appears to be 2.5-2.9.  -will continue to monitor -avoid nephro toxic medications  CAD Troponemia  Hx of RCA stent placement 12/2013 Troponin trend was flat.   -cardiac monitoring -Plavix 75 mg daily.   Hx T2DM A1C Pending. Last A1c 11/09/18 - 7.6 - SSI during her admission  Anemia of Chronic Disease:  -Daily cbc  Diet: Regular  Diet VTE: Restart home plavix IVF: None,None Code: Full  Dispo: In setting acute CVA, changed to inpatient  VSanjuana Letters Kennedy Internal Medicine Resident PGY-1 Pager [phone] contact the on call pager after 5 pm and on weekends at [phone]

## 2020-02-11 NOTE — Progress Notes (Signed)
OT Cancellation Note  Patient Details Name: [name] MRN: [id number] DOB: 1940-03-24   Cancelled Treatment:    Reason Eval/Treat Not Completed: Medical issues which prohibited therapy;Patient declined, no reason specified (Code stroke this AM and recieved TPA. Hold per RN. Will return as schedule allows.)  Morrison, OTR/L Acute Rehab Pager: [phone] Office: [phone] 02/11/2020, 11:55 AM

## 2020-02-11 NOTE — Plan of Care (Signed)

## 2020-02-11 NOTE — Consult Note (Signed)
Neurology Consultation Reason for Consult: Code stroke Requesting Physician: Joni Reining  CC: LLE numbness  History is obtained from: patient, chart review, patient nurse at bedside  HPI: [name] is a 80 y.o. female with a history significant for hypertension, hyperlipidemia, type 2 diabetes mellitus, chronic kidney disease stage III/IV, history of stroke (no residual left-sided weakness, right MCA s/p stent in 2015 with subsequent occlusion, right PCA CVA August 2020, multiple TIAs), coronary artery disease, transaortic valve replacement for severe aortic stenosis November 2020. She was admitted to [facility] on 02/08/20 for severe hypertension requiring nitroglycerin infusion and transient/varying degrees of left arm tingling, numbness, weakness, and left lower extremity weakness, concerning for a TIA with resolution of symptoms during hospitalization. Initial MRI negative for acute findings and patient is currently taking clopidogrel monotherapy for further stroke prophylaxis.   She was initially planned for admission to the ICU due to requiring antihypertensive drips for blood pressure control, but had been off the drip since 7 PM 1/24 evening  02/11/20: This morning at 06:45, the patient was speaking with her nurse and laughing when she experienced sudden onset left lower extremity numbness and left lower extremity and left arm weakness prompting a stroke alert activation.  Her symptoms were somewhat intermittent on initial evaluation (being unable to move her left lower extremity and then lifting it without drift for for 5 seconds, intermittently reporting loss of sensation on the left side); she was notably anxious as well.  Head CT did not show any acute intracranial abnormality, however on gait evaluation she required 2 person assist to ambulate a few steps and was quite unsteady.  Given her risk factors MRI brain was obtained to further clarify.  As this revealed an acute infarct in a location  that explain her symptoms well, risks and benefits of tPA were discussed with the patient and decision was made to proceed with tPA.  Due to the waxing and waning symptoms and need to obtain an MRI, there was a slight delay in time to tPA administration.  Despite her low total NIH stroke scale, given the significant impairment of her ambulation this was felt to be a disabling symptom.  LKW: 06:45 tPA given?: Yes at 08:29 Premorbid modified rankin scale:      1 - No significant disability. Able to carry out all usual activities, despite some symptoms.      ROS: A 14 point ROS was performed and is negative except as noted in the HPI.   Past Medical History:  Diagnosis Date  . Arthritis   . Cerebrovascular disease   . CKD (chronic kidney disease)    Sees Dr Hollie Salk  . Coronary artery disease   . Depression   . Diabetes ([place])    INSULIN DEPENDENT  . Diabetic peripheral neuropathy ([place])   . Diabetic retinopathy ([place])   . Diverticulitis   . GERD (gastroesophageal reflux disease)   . History of CVA (cerebrovascular accident)    x 2 no residulal  . Hyperlipidemia   . Hypertension   . Obesity   . Renal lesion   . S/P TAVR (transcatheter aortic valve replacement) 11/13/2018   s/p TAVR with a 67m Edwards S3U via the TF approach by Dr. ORoxy Mannsand Dr. MAngelena Form . Severe aortic stenosis    Family History  Problem Relation Age of Onset  . Diabetes Mother   . Heart disease Mother   . Prostate cancer Father   . Hypertension Brother   . Prostate cancer Brother  Social History:  reports that she has never smoked. She has never used smokeless tobacco. She reports that she does not drink alcohol and does not use drugs.  Exam: Current vital signs: BP (!) 162/58   Pulse 70   Temp 98.2 F (36.8 C) (Oral)   Resp 18   Ht '5\' 3"'$  (1.6 m)   Wt 70.9 kg   SpO2 93%   BMI 27.69 kg/m  Vital signs in last 24 hours: Temp:  [97.7 F (36.5 C)-98.2 F (36.8 C)] 98.2 F (36.8 C) (01/25 0415) Pulse  Rate:  [55-95] 70 (01/25 0700) Resp:  [15-30] 18 (01/25 0828) BP: (100-193)/(45-114) 162/58 (01/25 0828) SpO2:  [93 %-100 %] 93 % (01/25 0700) Weight:  [70.9 kg] 70.9 kg (01/25 0415)  Physical Exam  Constitutional: Appears well-developed and well-nourished.  Psych: Affect appropriate to situation Eyes: Normal conjunctiva Head: Normocephalic and atraumatic HENT: No OP obstrucion MSK: no joint deformities Cardiovascular: Normal rate on cardiac monitor, extremities warm and without edema Respiratory: Effort normal, non-labored breathing on room air GI: Soft.  Non-distended.  Skin: WDI  Neuro: neurologic exam with waxing/waning results. LLE weakness and drift present for one examiner and without weakness and drift for the next examiner. Inconsistent reports of decreased sensation of left upper and lower extremities; location and severity reported inconsistently from one examiner to another.  Mental Status: Patient is awake, alert, oriented to person, place, month, year, age, and situation. Patient is able to give a clear and coherent history of present illness.  Speech is clear without aphasia or dysarthria. No signs of neglect seen. Cranial Nerves: II: Visual Fields are full. Pupils are equal, round, and reactive to light 3 mm/brisk. III,IV, VI: EOMI without ptosis or diploplia.   V: Facial sensation is symmetric to light touch VII: Facial movement is symmetric resting and smiling VIII: Hearing is intact to voice X: Palate elevates symmetrically, phonation intact XI: Shoulder shrug is symmetric. XII: Tongue protrudes midline Motor: Tone is normal. Bulk is normal. 5/5 strength was present in all four extremities. Antigravity movement without pronator drift present in each extremity.  Sensory: Sensation is decreased in the left lower extremity compared to the right lower extremity. Sensation equal and symmetric to bilateral upper extremities.  Plantars: Toes are downgoing bilaterally.   Cerebellar: FNF and HKS are intact bilaterally. Gait: Does not attempt to pick up left foot during ambulation, does appear to be somewhat hemiparetic with circumduction of the left hip but also variable effort.   NIHSS total 4 1a Level of Conscious.: 0 1b LOC Questions: 0 1c LOC Commands: 0 2 Best Gaze: 0 3 Visual: 0 4 Facial Palsy: 0 5a Motor Arm - left: 0 5b Motor Arm - Right: 0 6a Motor Leg - Left: 3 (intermittent, 0 at best) 6b Motor Leg - Right: 0 7 Limb Ataxia: 0 8 Sensory: 1; (left leg intermittent numbness) 9 Best Language: 0 10 Dysarthria: 0 11 Extinct. and Inatten.: 0  I have reviewed labs in epic and the results pertinent to this consultation are: CBC    Component Value Date/Time   WBC 8.1 02/11/2020 0140   RBC 3.95 02/11/2020 0140   HGB 11.3 (L) 02/11/2020 0140   HGB 10.8 (L) 10/16/2018 1123   HCT 36.2 02/11/2020 0140   HCT 33.0 (L) 10/16/2018 1123   PLT 109 (L) 02/11/2020 0140   PLT 191 10/16/2018 1123   MCV 91.6 02/11/2020 0140   MCV 93 10/16/2018 1123   MCH 28.6 02/11/2020 0140  MCHC 31.2 02/11/2020 0140   RDW 13.2 02/11/2020 0140   RDW 13.3 10/16/2018 1123   LYMPHSABS 1.3 09/09/2018 1639   MONOABS 0.6 09/09/2018 1639   EOSABS 0.2 09/09/2018 1639   BASOSABS 0.0 09/09/2018 1639   CMP     Component Value Date/Time   NA 139 02/11/2020 0140   NA 138 11/21/2018 1408   K 4.7 02/11/2020 0140   CL 107 02/11/2020 0140   CO2 21 (L) 02/11/2020 0140   GLUCOSE 145 (H) 02/11/2020 0140   BUN 27 (H) 02/11/2020 0140   BUN 39 (H) 11/21/2018 1408   CREATININE 2.49 (H) 02/11/2020 0140   CALCIUM 9.1 02/11/2020 0140   PROT 6.3 (L) 11/09/2018 1045   ALBUMIN 3.1 (L) 11/09/2018 1045   AST 20 11/09/2018 1045   ALT 12 11/09/2018 1045   ALKPHOS 61 11/09/2018 1045   BILITOT 0.3 11/09/2018 1045   GFRNONAA 19 (L) 02/11/2020 0140   GFRAA 20 (L) 11/21/2018 1408   Lipid Panel     Component Value Date/Time   CHOL 289 (H) 02/08/2020 1924   TRIG 140 02/08/2020 1924    HDL 76 02/08/2020 1924   CHOLHDL 3.8 02/08/2020 1924   VLDL 28 02/08/2020 1924   LDLCALC 185 (H) 02/08/2020 1924   I have reviewed the images obtained: CT head: 1. No acute or of all vein cortically based infarct identified. Acute intracranial hemorrhage. ASPECTS 10. 2. Right MCA M1 stent. Right middle frontal gyrus and occipital lobe Encephalomalacia.  MRI brain and MR angio head IMPRESSION: 1. Interval development of 5 mm area of acute infarct in the right frontal parietal cortex over the convexity. This appears to be in the motor cortex. FLAIR imaging normal in this region. 2. Chronic infarct right frontal and right occipital lobe unchanged. 3. MRA demonstrates right M1 stent with loss of flow in the right middle cerebral artery unchanged from 2020 MRA. Possible occlusion versus artifact. 4. Bilateral PCA stenosis. No change from 2020  Echocardiogram 12/23/2019:  Left ventricle cavity is normal in size. Moderate concentric hypertrophy  of the left ventricle. Normal global wall motion. Normal LV systolic  function with EF 56%. Normal diastolic filling pattern.  Left atrial cavity is mildly dilated.  Well seated Wende Crease Sapien 3 bioprosthetic aortic valve with  normal functioning. Mean PG 11 mmHg. No valvular regurgitation or  paravalvular leak.  Mild (Grade I) mitral regurgitation.  Mild tricuspid regurgitation. Estimated pulmonary artery systolic pressure  31 mmHg.  Mild pulmonic regurgitation.  No significant change compared to previous study on 12/11/2018.  MRA of head 09/10/2018:  1. Acute infarct right occipital lobe. No associated hemorrhage in the infarct. Chronic microhemorrhage right parietal white matter appears separate. 2. Right M1 stent obscures evaluation of the right middle cerebral artery on MRA 3. Right posterior cerebral artery patent. 4. Occluded distal P3 branch on the left.  Impression: 80 year old female with multiple stroke risk factors who  presented to the ED for severe hypertension requiring nitroglycerin infusion, and transient left extremity tingling, numbness, and weakness with resolution during hospitalization was activated as a code stroke 02/11/20 for sudden onset of left lower extremity numbness, tingling, and left-sided weakness that had previously resolved since admission. CTH was obtained without acute findings, MRI with evidence of acute infarct of the right frontal parietal cortex. Etiology likely atheroembolic versus cardioembolic in nature. The decision was made to administer tPA based on persistent debilitating symptoms and evidence of acute infarct on imaging.  Loss of signal in right  M1 stent on MRA not felt to represent acute occlusion given her exam is not consistent with a large vessel occlusion and this is stable from prior MRA.   Recommendations: # Punctate right leg motor cortex stroke causing left leg weakness - HgbA1c, fasting lipid panel - MRI at 24 hours s/p tPA -Strict blood pressure parameters <180/105 for at least 24 hours post TPA - Frequent neuro checks; STAT CT head with neurologic change - Appropriate for low-intensity monitoring following tPA (optimist trial, provided she does not require ICU monitoring for other indications) - Echocardiogram completed recently, no need to repeat unless new cardiac symptoms arise, however will need long-term rhythm monitoring.  Consider loop recorder given her significant cardiovascular history and mild left atrial enlargement - Prophylactic therapy- Antiplatelet med: continue home clopidogrel, add 81 mg aspirin PO x 3 weeks starting 24 hours post TPA if patient remains stable - Risk factor modification - Telemetry monitoring - PT consult, OT consult, Speech consult - Stroke team to follow  Anibal Henderson, AGACNP-BC [facility]  [phone]  Attending Neurologist's note:  I personally saw this patient, gathering history, performing a full neurologic  examination, reviewing relevant labs, personally reviewing relevant imaging including head CT, MRI brain, MRA, and formulated the assessment and plan, adding the note above for completeness and clarity to accurately reflect my thoughts

## 2020-02-11 NOTE — Code Documentation (Signed)
Pt is 80 yr old admitted to 2 H for management of HTN and stroke workup. She has had waxing and waning LLE weakness and sensation loss. She was known to be completely back to her baseline per nursing this morning at 0645. At bedside rounds pt was seen to have reemergence of left sided weakness, so code stroke was called at 0724. Upon exam by stroke team, pt has Left leg sensory loss as well as intermittent left leg weakness. Pt taken to CT scan at 0736 which was negative for hemorrhage per neurologist. Pt taken to stat MRI. Per neurologist, ischemia seen on MRI, decision made to give TPA at [phone]. Pt's BP was elevated so 5 mg labetelol given at 0822. BP down to [id number] systolic after labetelol given. TPA 63.'8mg'$  started with 6.'3mg'$  load at 0829. Serial NIHSS began at 0830. Pt will need q 15 min mNIHSS until 1030. At ten thirty she will need a full NIHSS.then VS and mNIHSS q 30 min for 6 hr then q hour for 16 hrs. Pt will need to keep BP less than 180/105. Above discussed with RN [place].

## 2020-02-11 NOTE — Progress Notes (Signed)
Pharmacist Code Stroke Response  Notified to mix tPA at 0822 by Dr. Curly [place] Delivered tPA to RN at [phone]  tPA dose = 6.'3mg'$  bolus over 1 minute followed by 56.'7mg'$  for a total dose of '63mg'$  over 1 hour  Issues/delays encountered (if applicable): None  [name], PharmD., BCPS, BCCCP Clinical Pharmacist Please refer to [facility] for unit-specific pharmacist

## 2020-02-11 NOTE — Progress Notes (Signed)
At approximately [place], RN was in patient's room. Patient endorsed feeling weak. Strength checks were unchanged from previous assessments throughout the night. Patient still A&Ox4 with no pupillary changes. Patient endorsed mild anxiety and agreed to attempt to relax and eat breakfast. At 0724, during shift report, patient called out from room endorsing numbness and increased weakness in left leg and increased weakness in left arm, showing that she'd spilled her coffee. On a quick assessment, strength was drastically decreased from previous assessments. This RN paged a code stroke and oncoming RN immediately performed an NIHSS assessment. CT called for patient. MDs to bedside and updated while preparing patient. MDs, stroke team, oncoming nurse transported patient to [place].

## 2020-02-12 ENCOUNTER — Inpatient Hospital Stay (HOSPITAL_COMMUNITY): Payer: Medicare PPO

## 2020-02-12 DIAGNOSIS — D696 Thrombocytopenia, unspecified: Secondary | ICD-10-CM

## 2020-02-12 DIAGNOSIS — E1159 Type 2 diabetes mellitus with other circulatory complications: Secondary | ICD-10-CM

## 2020-02-12 DIAGNOSIS — Z794 Long term (current) use of insulin: Secondary | ICD-10-CM

## 2020-02-12 DIAGNOSIS — Z8673 Personal history of transient ischemic attack (TIA), and cerebral infarction without residual deficits: Secondary | ICD-10-CM

## 2020-02-12 DIAGNOSIS — I6340 Cerebral infarction due to embolism of unspecified cerebral artery: Secondary | ICD-10-CM | POA: Diagnosis not present

## 2020-02-12 DIAGNOSIS — E782 Mixed hyperlipidemia: Secondary | ICD-10-CM

## 2020-02-12 DIAGNOSIS — I63421 Cerebral infarction due to embolism of right anterior cerebral artery: Secondary | ICD-10-CM

## 2020-02-12 LAB — PATHOLOGIST SMEAR REVIEW

## 2020-02-12 LAB — BASIC METABOLIC PANEL
Anion gap: 13 (ref 5–15)
BUN: 33 mg/dL — ABNORMAL HIGH (ref 8–23)
CO2: 20 mmol/L — ABNORMAL LOW (ref 22–32)
Calcium: 8.8 mg/dL — ABNORMAL LOW (ref 8.9–10.3)
Chloride: 103 mmol/L (ref 98–111)
Creatinine, Ser: 2.65 mg/dL — ABNORMAL HIGH (ref 0.44–1.00)
GFR, Estimated: 18 mL/min — ABNORMAL LOW (ref >60)
Glucose, Bld: 128 mg/dL — ABNORMAL HIGH (ref 70–99)
Potassium: 3.9 mmol/L (ref 3.5–5.1)
Sodium: 136 mmol/L (ref 135–145)

## 2020-02-12 LAB — CBC
HCT: 34.2 % — ABNORMAL LOW (ref 36.0–46.0)
Hemoglobin: 11.4 g/dL — ABNORMAL LOW (ref 12.0–15.0)
MCH: 29.8 pg (ref 26.0–34.0)
MCHC: 33.3 g/dL (ref 30.0–36.0)
MCV: 89.3 fL (ref 80.0–100.0)
Platelets: 120 10*3/uL — ABNORMAL LOW (ref 150–400)
RBC: 3.83 MIL/uL — ABNORMAL LOW (ref 3.87–5.11)
RDW: 13.5 % (ref 11.5–15.5)
WBC: 7 10*3/uL (ref 4.0–10.5)
nRBC: 0 % (ref 0.0–0.2)

## 2020-02-12 LAB — GLUCOSE, CAPILLARY
Glucose-Capillary: 130 mg/dL — ABNORMAL HIGH (ref 70–99)
Glucose-Capillary: 236 mg/dL — ABNORMAL HIGH (ref 70–99)
Glucose-Capillary: 65 mg/dL — ABNORMAL LOW (ref 70–99)
Glucose-Capillary: 133 mg/dL — ABNORMAL HIGH (ref 70–99)
Glucose-Capillary: 168 mg/dL — ABNORMAL HIGH (ref 70–99)

## 2020-02-12 MED ORDER — LORAZEPAM 2 MG/ML IJ SOLN
1.0000 mg | Freq: Once | INTRAMUSCULAR | Status: AC | PRN
  Administered 2020-02-12: 1 mg via INTRAVENOUS
  Filled 2020-02-12: qty 1

## 2020-02-12 MED ORDER — ASPIRIN 81 MG PO CHEW: 81.0000 mg | CHEWABLE_TABLET | Freq: Every day | ORAL | Status: DC

## 2020-02-12 MED ORDER — HYDRALAZINE HCL 20 MG/ML IJ SOLN: 5.0000 mg | INTRAMUSCULAR | Status: DC | PRN

## 2020-02-12 NOTE — Progress Notes (Signed)
Inpatient Rehab Admissions Coordinator Note:   Per therapy recommendations, pt was screened for CIR candidacy by Shann Medal, PT, DPT.  At this time we are recommending a CIR consult and I will request an order per our protocol.  Please contact me with questions.   Shann Medal, PT, DPT [phone] 02/12/20 3:07 PM

## 2020-02-12 NOTE — Progress Notes (Signed)
OT Cancellation Note  Patient Details Name: YAEL SHERMER MRN: [id number] DOB: [birth year]   Cancelled Treatment:    Reason Eval/Treat Not Completed: Patient at procedure or test/ unavailable.  OT to continue efforts as appropriate.    Emmalina Espericueta D Kaeleigh Westendorf 02/12/2020, 8:58 AM

## 2020-02-12 NOTE — Progress Notes (Addendum)
Subjective:   No acute events overnight.   [name] is smiley this morning. States she is feeling "so much better" compared to yesterday. Feels her strength has improved. Denies numbness, tingling, headaches. Reports daughter is on her way to visit. Asks if she can avoid the MRI because she feels panicky. States she received something for anxiety prior to her MRI in the ED.  Asks what caused her elevated blood pressure yesterday. Reports prior to coming to the hospital her BP would be higher in the morning. Daughter had reported teeth grinding. Patient can't recall her cardiologist recommending a sleep study. States she has a diagnosis of sleep apnea "years ago," had a machine she slept with.   Objective:  Vital signs in last 24 hours: Vitals:   02/12/20 0200 02/12/20 0300 02/12/20 0400 02/12/20 0500  BP: (!) 136/54 (!) 157/55 (!) 165/53 (!) 166/52  Pulse: 62 (!) 58 61 60  Resp: '19 20 18 20  '$ Temp:      TempSrc:      SpO2: 99% 100% 100% 100%  Weight:   67.3 kg   Height:       Supplemental O2: Room Air SpO2: 100 %   Physical Exam:   Constitutional: well-appearing resting in bed comforbtly HENT: normocephalic atraumatic. Right pupil ERRL. Left pupil abnormal pupil.  Cardiovascular: Systolic murmur appreciated, best heard over second right intercostal space. Normal rate and rhythm Pulmonary/Chest: normal work of breathing on room air, lungs clear to auscultation bilaterally Neurological: alert & oriented x 3. Normal facial sensation. Facial movement symmetric, right sided facial droop. No uvular deviation. Normal extremity sensation. Weakness of left lower extremity 4/5. Heel to shin normal. Upper extremities without weakness. Normal Mccumbers to nose.  Skin: warm and dry  Filed Weights   02/08/20 0611 02/11/20 0415 02/12/20 0400  Weight: 73.9 kg 70.9 kg 67.3 kg    Intake/Output Summary (Last 24 hours) at 02/12/2020 0603 Last data filed at 02/11/2020 1800 Gross per 24 hour   Intake 885.56 ml  Output 950 ml  Net -64.44 ml   Net IO Since Admission: -1,968.89 mL [02/12/20 0603]  Pertinent Labs: CBC Latest Ref Rng & Units 02/11/2020 02/10/2020 02/09/2020  WBC 4.0 - 10.5 K/uL 8.1 7.0 7.4  Hemoglobin 12.0 - 15.0 g/dL 11.3(L) 11.0(L) 10.8(L)  Hematocrit 36.0 - 46.0 % 36.2 34.7(L) 34.3(L)  Platelets 150 - 400 K/uL 109(L) 98(L) 97(L)    CMP Latest Ref Rng & Units 02/11/2020 02/10/2020 02/09/2020  Glucose 70 - 99 mg/dL 145(H) 177(H) 134(H)  BUN 8 - 23 mg/dL 27(H) 28(H) 28(H)  Creatinine 0.44 - 1.00 mg/dL 2.49(H) 2.34(H) 2.38(H)  Sodium 135 - 145 mmol/L 139 137 138  Potassium 3.5 - 5.1 mmol/L 4.7 4.0 4.1  Chloride 98 - 111 mmol/L 107 108 107  CO2 22 - 32 mmol/L 21(L) 21(L) 22  Calcium 8.9 - 10.3 mg/dL 9.1 8.9 8.9  Total Protein 6.5 - 8.1 g/dL - - -  Total Bilirubin 0.3 - 1.2 mg/dL - - -  Alkaline Phos 38 - 126 U/L - - -  AST 15 - 41 U/L - - -  ALT 0 - 44 U/L - - -    Imaging: MR ANGIO HEAD WO CONTRAST  Result Date: 02/11/2020 CLINICAL DATA:  Stroke.  Left leg weakness. EXAM: MRI HEAD WITHOUT CONTRAST MRA HEAD WITHOUT CONTRAST TECHNIQUE: Multiplanar, multiecho pulse sequences of the brain and surrounding structures were obtained without intravenous contrast. Angiographic images of the head were obtained using MRA  technique without contrast. COMPARISON:  MRI head 02/08/2020.  MRA head 09/10/2018 FINDINGS: MRI HEAD FINDINGS Brain: Interval development of small area of restricted diffusion in the right frontal parietal cortex over the convexity. This appears to be in the motor cortex accounting for the patient's left leg weakness. FLAIR imaging is normal in this area. No other areas of restricted diffusion Chronic infarcts in the right posterior frontal lobe and right occipital lobe unchanged. Mild white matter changes. Limited protocol per neurology. These results were called by telephone at the time of interpretation on 02/11/2020 at 8:30 am to provider [facility] ,  who verbally acknowledged these results. MRA HEAD FINDINGS Internal carotid artery widely patent bilaterally. Anterior cerebral arteries patent bilaterally. Left middle cerebral artery widely patent. Stent in the right M1 segment. Loss of flow related signal throughout the right middle cerebral artery occluding M1, M2, M3 branches as noted on the prior MRI a of 09/10/2018. Loss of signal may be due to stent related artifact versus occlusion. Both vertebral arteries patent to the basilar. Right vertebral artery dominant. Basilar widely patent. Right AICA patent. Fetal origin left posterior cerebral artery. Patent right posterior communicating artery. Mild stenosis distal right posterior cerebral artery. High-grade stenosis versus occlusion left P3 segment, unchanged from the prior MRA. IMPRESSION: 1. Interval development of 5 mm area of acute infarct in the right frontal parietal cortex over the convexity. This appears to be in the motor cortex. FLAIR imaging normal in this region. 2. Chronic infarct right frontal and right occipital lobe unchanged. 3. MRA demonstrates right M1 stent with loss of flow in the right middle cerebral artery unchanged from 2020 MRA. Possible occlusion versus artifact. 4. Bilateral PCA stenosis.  No change from 2020 Electronically Signed   By: Franchot Gallo [name]   On: 02/11/2020 08:43   MR BRAIN WO CONTRAST  Result Date: 02/11/2020 CLINICAL DATA:  Stroke.  Left leg weakness. EXAM: MRI HEAD WITHOUT CONTRAST MRA HEAD WITHOUT CONTRAST TECHNIQUE: Multiplanar, multiecho pulse sequences of the brain and surrounding structures were obtained without intravenous contrast. Angiographic images of the head were obtained using MRA technique without contrast. COMPARISON:  MRI head 02/08/2020.  MRA head 09/10/2018 FINDINGS: MRI HEAD FINDINGS Brain: Interval development of small area of restricted diffusion in the right frontal parietal cortex over the convexity. This appears to be in the motor  cortex accounting for the patient's left leg weakness. FLAIR imaging is normal in this area. No other areas of restricted diffusion Chronic infarcts in the right posterior frontal lobe and right occipital lobe unchanged. Mild white matter changes. Limited protocol per neurology. These results were called by telephone at the time of interpretation on 02/11/2020 at 8:30 am to provider [facility] , who verbally acknowledged these results. MRA HEAD FINDINGS Internal carotid artery widely patent bilaterally. Anterior cerebral arteries patent bilaterally. Left middle cerebral artery widely patent. Stent in the right M1 segment. Loss of flow related signal throughout the right middle cerebral artery occluding M1, M2, M3 branches as noted on the prior MRI a of 09/10/2018. Loss of signal may be due to stent related artifact versus occlusion. Both vertebral arteries patent to the basilar. Right vertebral artery dominant. Basilar widely patent. Right AICA patent. Fetal origin left posterior cerebral artery. Patent right posterior communicating artery. Mild stenosis distal right posterior cerebral artery. High-grade stenosis versus occlusion left P3 segment, unchanged from the prior MRA. IMPRESSION: 1. Interval development of 5 mm area of acute infarct in the right frontal parietal cortex  over the convexity. This appears to be in the motor cortex. FLAIR imaging normal in this region. 2. Chronic infarct right frontal and right occipital lobe unchanged. 3. MRA demonstrates right M1 stent with loss of flow in the right middle cerebral artery unchanged from 2020 MRA. Possible occlusion versus artifact. 4. Bilateral PCA stenosis.  No change from 2020 Electronically Signed   By: Franchot Gallo [name]   On: 02/11/2020 08:43   CT HEAD CODE STROKE WO CONTRAST`  Result Date: 02/11/2020 CLINICAL DATA:  Code stroke.  80 year old female EXAM: CT HEAD WITHOUT CONTRAST TECHNIQUE: Contiguous axial images were obtained from the base of the  skull through the vertex without intravenous contrast. COMPARISON:  Brain MRI and head CT 02/08/2020. FINDINGS: Brain: Stable encephalomalacia in the right middle frontal gyrus, right occipital lobe. Stable gray-white matter differentiation throughout the brain. No midline shift, ventriculomegaly, mass effect, evidence of mass lesion, intracranial hemorrhage or evidence of cortically based acute infarction. Vascular: Calcified atherosclerosis at the skull base. Right MCA M1 stent in stable position. No suspicious intracranial vascular hyperdensity. Skull: No acute osseous abnormality identified. Sinuses/Orbits: Visualized paranasal sinuses and mastoids are stable and well pneumatized. Other: Stable orbit and scalp soft tissues. ASPECTS [facility] Stroke Program Early CT Score) Total score (0-10 with 10 being normal): 10 (chronic right hemisphere encephalomalacia) IMPRESSION: 1. No acute or of all vein cortically based infarct identified. Acute intracranial hemorrhage. ASPECTS 10. 2. Right MCA M1 stent. Right middle frontal gyrus and occipital lobe encephalomalacia. 3. These results were communicated to Dr. Curly Shores at 7:49 am on 02/11/2020 by text page via the [facility] messaging system. Electronically Signed   By: Genevie Ann [name]   On: 02/11/2020 07:49    Assessment/Plan:   Active Problems:   Weakness   Acute left-sided weakness   Thrombocytopenia (HCC)   Patient Summary: [name] is a 80 y.o. with pertinent PMH of HTN, HLD, T2DM, CKD Stg III/IV, h/o of CVA R. MCA 2015 s/p stent with subsequent occlusion, CVA R. PCA 08/2018, multiple TIA's, CAD RCA stent placement, TAVR for severe AS 11/2018 who presented with weakness and admit for further neurological workup  Weakness Likely 2/2 to Elevated Blood Pressure & New Acute Infarct  Hx of CVA and TIA Patient continues to have left lower extremity weakness, however, heel to shin has improved.Blood pressure with improvement as well, systolics in [id number] with  MAP in the 80's. Plan for repeat MRI w/o C today to assess for any bleed, 24 hours post tPA therapy. Neurology recommendations as per below.  -MRI today s/p tPA -strict BP parameters - <180/105 -frequent neuro checks - STAT Head CT if positive -no echo, patient had one earlier December -continue aspirin, plavix 75 mg daily -PT/OT pending -continue home medications amlodipine, labetalol, torsemide -continue imdur -continue crestor 20 mg daily, elevated LDL and total cholesterol. Endorses compliance, but notes she was out of the meds for a "few" days prior to admission.  -consider longer term rhythm monitoring, will review tele EKG NSR this morning  Thrombocytopenia On admission, patient with plt 90's. Appears new since labs 1 yr ago. Uncertain of etiology at this point. Blood smear with evenidence of thrombocytopenia, consider immature platelet fraction.  Platelets improving  Hx of TAVR 11/2018 HFpEF Last echo 12/23/19 - LVEF 56%, Aortic valve replacement without regurgitation or leak. Mild mitral and tricuspid regurgitation. Do not believe contributing to wide pulse pressures, appears to be chronic.   CKD Stg III/IV Cr of 2.65 Baseline appears to  be 2.5-2.9.  -will continue to monitor -avoid nephro toxic medications  CAD Troponemia  Hx of RCA stent placement 12/2013 Troponin trend was flat.   -cardiac monitoring -Plavix 75 mg daily.   Hx T2DM A1C Pending. Last A1c 11/09/18 - 7.6 - SSI during her admission  Anemia of Chronic Disease:  Patient remains normocytic, hgb stable.  -Daily cbc  Diet: Regular Diet VTE: Plavix IVF: None,None Code: Full  Dispo: In setting acute CVA, changed to inpatient  Sanjuana Letters, DO Internal Medicine Resident PGY-1 Pager [phone] Please contact the on call pager after 5 pm and on weekends at [phone].

## 2020-02-12 NOTE — Progress Notes (Signed)
Called CT for transport 

## 2020-02-12 NOTE — Consult Note (Signed)
Physical Medicine and Rehabilitation Consult Reason for Consult: Left side weakness Referring Physician: [facility]   HPI: [name] is a 80 y.o. right-handed female with history of diabetes mellitus, CKD followed by Dr. Hollie Salk, hyperlipidemia, history of CVA x2 without residual weakness including right MCA stenting, obesity with BMI 26.28, CAD with TVAR procedure.  Per chart review patient lives alone independent prior to admission.  Two-level home 2 steps to entry.  Presented 02/08/2020 with headache transient left arm weakness and numbness.  Cranial CT scan negative for acute changes.  Patient did not receive TPA.  MRI showed interval development of 2 foci of acute parenchymal hemorrhage within the paramedian mid to posterior right frontal lobe largest measuring 1.2 cm with mild surrounding vasogenic edema.  2 unchanged 4 mm acute early subacute infarct within the posterior right frontal lobe.  Admission chemistries BUN 27, creatinine 2.49, hemoglobin A1c 7.7.  Neurology follow-up close monitoring of blood pressure and awaiting plan for loop recorder placement and awaiting plan for loop recorder placement.  Therapy evaluations completed with recommendations of physical medicine rehab consult.   Pt reports a spot on left side of middle back hurts.  Otherwise, no pain from RA or current issues- sleeping well.  LBM was likely Sunday (nurse thought 1/25, but pt emphatic was earlier than that- appeared to be good historian).  Voiding OK with purewick.   Review of Systems  Constitutional: Negative for chills and fever.  HENT: Negative for hearing loss.   Eyes: Negative for blurred vision and double vision.  Respiratory: Negative for cough and shortness of breath.   Cardiovascular: Positive for leg swelling. Negative for chest pain and palpitations.  Gastrointestinal: Positive for constipation. Negative for heartburn, nausea and vomiting.       GERD  Genitourinary: Negative for dysuria, flank  pain and hematuria.  Musculoskeletal: Positive for myalgias.  Skin: Negative for rash.  Neurological: Positive for sensory change and weakness.  Psychiatric/Behavioral: Positive for depression. The patient has insomnia.   All other systems reviewed and are negative.  Past Medical History:  Diagnosis Date  . Arthritis   . Cerebrovascular disease   . CKD (chronic kidney disease)    Sees Dr Hollie Salk  . Coronary artery disease   . Depression   . Diabetes ([place])    INSULIN DEPENDENT  . Diabetic peripheral neuropathy ([place])   . Diabetic retinopathy ([place])   . Diverticulitis   . GERD (gastroesophageal reflux disease)   . History of CVA (cerebrovascular accident)    x 2 no residulal  . Hyperlipidemia   . Hypertension   . Obesity   . Renal lesion   . S/P TAVR (transcatheter aortic valve replacement) 11/13/2018   s/p TAVR with a 60m Edwards S3U via the TF approach by Dr. ORoxy Mannsand Dr. MAngelena Form . Severe aortic stenosis    Past Surgical History:  Procedure Laterality Date  . ABDOMINAL HYSTERECTOMY    . AV FISTULA PLACEMENT Left 12/18/2017   Procedure: ARTERIOVENOUS (AV) FISTULA CREATION ARM;  Surgeon: CMarty Heck MD;  [facility]  Service: Vascular;  Laterality: Left;  . bilateral cataract surgery    . CARDIAC CATHETERIZATION  01/07/2014   [place]  02[place]  . COLONOSCOPY    . COLOSTOMY  02/1999  . COLOSTOMY CLOSURE  07/1999  . EYE SURGERY Left 2019  . FISTULA SUPERFICIALIZATION Left 06/29/2018   Procedure: FISTULA SUPERFICIALIZATION LEFT BRACHIOCEPHALIC;  Surgeon: CMarty Heck MD;  Location: [facility];  Service: Vascular;  Laterality: Left;  . LEFT HEART CATHETERIZATION WITH CORONARY ANGIOGRAM N/A 01/07/2014   Procedure: LEFT HEART CATHETERIZATION WITH CORONARY ANGIOGRAM;  Surgeon: Laverda Page, MD;  Location: [facility] CATH LAB;  Service: Cardiovascular;  Laterality: N/A;  . middle cerebral artery stent placement Right   . OTHER SURGICAL HISTORY      laser surgery  . PTCA  01/07/2014   DES to RCA    DR Einar Gip  . right knee surgery Right    for infection  . RIGHT/LEFT HEART CATH AND CORONARY ANGIOGRAPHY N/A 10/23/2018   Procedure: RIGHT/LEFT HEART CATH AND CORONARY ANGIOGRAPHY;  Surgeon: Nigel Mormon, MD;  Location: [place] [facility];  Service: Cardiovascular;  Laterality: N/A;  . TEE WITHOUT CARDIOVERSION N/A 11/13/2018   Procedure: TRANSESOPHAGEAL ECHOCARDIOGRAM (TEE);  Surgeon: Burnell Blanks, MD;  Location: [facility];  Service: Open Heart Surgery;  Laterality: N/A;  . TRANSCATHETER AORTIC VALVE REPLACEMENT, TRANSFEMORAL  11/13/2018  . TRANSCATHETER AORTIC VALVE REPLACEMENT, TRANSFEMORAL N/A 11/13/2018   Procedure: TRANSCATHETER AORTIC VALVE REPLACEMENT, TRANSFEMORAL;  Surgeon: Burnell Blanks, MD;  Location: [facility] [place];  Service: Open Heart Surgery;  Laterality: N/A;   Family History  Problem Relation Age of Onset  . Diabetes Mother   . Heart disease Mother   . Prostate cancer Father   . Hypertension Brother   . Prostate cancer Brother    Social History:  reports that she has never smoked. She has never used smokeless tobacco. She reports that she does not drink alcohol and does not use drugs. Allergies: No Known Allergies Medications Prior to Admission  Medication Sig Dispense Refill  . acetaminophen (TYLENOL) 325 MG tablet Take 650 mg by mouth every 6 (six) hours as needed for moderate pain or headache.     . allopurinol (ZYLOPRIM) 100 MG tablet Take 50 mg by mouth daily.    Marland Kitchen amLODipine (NORVASC) 10 MG tablet TAKE 1 TABLET BY MOUTH EVERY DAY (Patient taking differently: Take 10 mg by mouth daily.) 90 tablet 3  . amoxicillin (AMOXIL) 500 MG tablet Take 4 tablets (2,000 mg total) by mouth as directed. 30-60 min before dental procedure 4 tablet 6  . B-D INS SYR ULTRAFINE 1CC/30G 30G X 1/2" 1 ML MISC Inject 1 each into the skin 3 (three) times daily between meals.    . calcitRIOL (ROCALTROL) 0.25 MCG capsule Take  0.25 mcg by mouth daily.     . cetirizine (ZYRTEC) 10 MG tablet Take 10 mg by mouth daily as needed for allergies.    . Desvenlafaxine ER 100 MG TB24 Take 100 mg by mouth daily.    . folic acid (FOLVITE) 1 MG tablet Take 1 mg by mouth daily.    Marland Kitchen HUMALOG KWIKPEN 100 UNIT/ML KwikPen Inject 2-8 Units into the skin 3 (three) times daily before meals.     . insulin glargine (LANTUS) 100 UNIT/ML injection Inject 13 Units into the skin at bedtime.    . isosorbide-hydrALAZINE (BIDIL) 20-37.5 MG tablet Take 2 tablets by mouth 3 (three) times daily. 540 tablet 2  . labetalol (NORMODYNE) 100 MG tablet Take 1 tablet (100 mg total) by mouth 2 (two) times daily. (Patient taking differently: Take 200 mg by mouth 2 (two) times daily.) 180 tablet 2  . leflunomide (ARAVA) 10 MG tablet Take 10 mg by mouth daily.    . niacin (NIASPAN) 500 MG CR tablet Take 500 mg by mouth daily.     . nortriptyline (PAMELOR)  10 MG capsule Take 10 mg by mouth daily.    . ONE TOUCH ULTRA TEST test strip 1 each by Other route 3 (three) times daily between meals.    . polyvinyl alcohol (LIQUIFILM TEARS) 1.4 % ophthalmic solution Place 1 drop into both eyes 3 (three) times daily as needed for dry eyes.    . rosuvastatin (CRESTOR) 20 MG tablet Take 20 mg by mouth daily.    Marland Kitchen torsemide (DEMADEX) 20 MG tablet Take 1 tablet (20 mg total) by mouth daily. May take additional dose in the afternoon for swelling, shortness of breath or weight gain. (Patient taking differently: Take 40 mg by mouth daily. May take additional dose in the afternoon for swelling, shortness of breath or weight gain.) 180 tablet 0  . brimonidine (ALPHAGAN) 0.2 % ophthalmic solution  (Patient not taking: Reported on 02/08/2020)    . nitroGLYCERIN (NITROSTAT) 0.4 MG SL tablet PLACE 1 TABLET (0.4 MG TOTAL) UNDER THE TONGUE EVERY 5 MINUTES X 3 DOSES AS NEEDED FOR CHEST PAIN. (Patient taking differently: Place 0.4 mg under the tongue every 5 (five) minutes as needed for chest  pain.) 25 tablet 1    Home: Home Living Family/patient expects to be discharged to:: Private residence Living Arrangements: Alone Available Help at Discharge: Family,Available 24 hours/day Type of Home: House Home Access: Stairs to enter [employer] of Steps: 2 Entrance Stairs-Rails: None Home Layout: Two level Alternate Level Stairs-Number of Steps: flight Alternate Level Stairs-Rails: Left,Right Bathroom Shower/Tub: Multimedia programmer: Standard Home Equipment: Shower seat,Cane - single point  Functional History: Prior Function Level of Independence: Independent Functional Status:  Mobility: Bed Mobility Overal bed mobility: Needs Assistance Bed Mobility: Supine to Sit Supine to sit: Min assist Sit to supine: Supervision General bed mobility comments: pt up in chair upon PT arrival Transfers Overall transfer level: Needs assistance Equipment used: 1 person hand held assist Transfers: Sit to/from [employer] Sit to Stand: Min assist Stand pivot transfers: Min assist General transfer comment: pt with retropulsion with standing, max verbal cues for hand placement, modA to power up and steady during transition of hands to RW, minA to advance L LE, increased time, difficulty sequencing stepping to/from [employer] Ambulation/Gait Ambulation/Gait assistance: Min guard Gait Distance (Feet): 5 Feet Assistive device: 1 person hand held assist Gait Pattern/deviations: Step-through pattern,Decreased stride length General Gait Details: attempted to march in place in RW however pt with strong L lateral lean, with tactile and verbal cues able to correct but then became nauseated and was very sleepy Gait velocity: Decreased    ADL: ADL Overall ADL's : Needs assistance/impaired Eating/Feeding: Set up,Sitting Grooming: Set up,Sitting Upper Body Dressing : Minimal assistance,Sitting Lower Body Dressing: Moderate assistance,Sitting/lateral  leans Functional mobility during ADLs: Minimal assistance General ADL Comments: multiple LOB backwards  Cognition: Cognition Overall Cognitive Status: Impaired/Different from baseline Orientation Level: Oriented X4 Cognition Arousal/Alertness: Lethargic,Suspect due to medications Behavior During Therapy: Flat affect Overall Cognitive Status: Impaired/Different from baseline Area of Impairment: Problem solving Problem Solving: Slow processing General Comments: pt received ativan earlier for scheduled MRI  Blood pressure (!) 142/47, pulse (!) 53, temperature 97.7 F (36.5 C), temperature source Oral, resp. rate (!) 21, height '5\' 3"'$  (1.6 m), weight 67.3 kg, SpO2 100 %. Physical Exam Vitals and nursing note reviewed.  Constitutional:      Comments: Awake, lert, but sleepy, appropriate, laying supine in bed, NAD   HENT:     Head:     Comments: Smile equal- tongue  midline    Right Ear: External ear normal.     Left Ear: External ear normal.     Nose: Nose normal. No congestion.     Mouth/Throat:     Mouth: Mucous membranes are dry.     Pharynx: Oropharynx is clear. No oropharyngeal exudate.  Eyes:     Comments: EOMI B/L however lacking vision in L visual field- no nystagmus; mild L lid lag  Cardiovascular:     Comments: Borderline bradycardia regular rhythm Pulmonary:     Comments: CTA B/L- no W/R/R- good air movement  Abdominal:     Comments: Soft, NT, ND, (+)BS -hypoactive  Genitourinary:    Comments: purewick in place- draining medium amber urine Musculoskeletal:     Cervical back: Normal range of motion. No rigidity.     Comments: RUE 5/5 LUE- 4/5 in Bicep and tricep and 4+/5 in grip and Galan abd RLE_ 5/5 LLE- HF 3-/5, KE 3/5, DF 2/5, PF 2+/5  Has L CVA tenderness- significant CVA TTP  Skin:    Comments: 2 IV's RUE- look OK  Neurological:     Comments: Patient is awake alert no acute distress oriented x3.  Ox3- appropriate  Intact to light touch in all 4  extremities.  Knew all cognitive questions including the next legal or celebrated holiday (valentines' day)   Psychiatric:     Comments: Sleepy, but appropriate     Results for orders placed or performed during the hospital encounter of 02/08/20 (from the past 24 hour(s))  Hemoglobin A1c     Status: Abnormal   Collection Time: 02/11/20  4:00 PM  Result Value Ref Range   Hgb A1c MFr Bld 7.7 (H) 4.8 - 5.6 %   Mean Plasma Glucose 174.29 mg/dL  Glucose, capillary     Status: Abnormal   Collection Time: 02/11/20  4:13 PM  Result Value Ref Range   Glucose-Capillary 155 (H) 70 - 99 mg/dL  Lipid panel     Status: Abnormal   Collection Time: 02/11/20  4:25 PM  Result Value Ref Range   Cholesterol 296 (H) 0 - 200 mg/dL   Triglycerides 148 <150 mg/dL   HDL 67 >40 mg/dL   Total CHOL/HDL Ratio 4.4 RATIO   VLDL 30 0 - 40 mg/dL   LDL Cholesterol 199 (H) 0 - 99 mg/dL  Glucose, capillary     Status: Abnormal   Collection Time: 02/11/20  9:31 PM  Result Value Ref Range   Glucose-Capillary 135 (H) 70 - 99 mg/dL  Glucose, capillary     Status: Abnormal   Collection Time: 02/12/20  6:42 AM  Result Value Ref Range   Glucose-Capillary 130 (H) 70 - 99 mg/dL  CBC     Status: Abnormal   Collection Time: 02/12/20  6:53 AM  Result Value Ref Range   WBC 7.0 4.0 - 10.5 K/uL   RBC 3.83 (L) 3.87 - 5.11 MIL/uL   Hemoglobin 11.4 (L) 12.0 - 15.0 g/dL   HCT 34.2 (L) 36.0 - 46.0 %   MCV 89.3 80.0 - 100.0 fL   MCH 29.8 26.0 - 34.0 pg   MCHC 33.3 30.0 - 36.0 g/dL   RDW 13.5 11.5 - 15.5 %   Platelets 120 (L) 150 - 400 K/uL   nRBC 0.0 0.0 - 0.2 %  Basic metabolic panel     Status: Abnormal   Collection Time: 02/12/20  6:53 AM  Result Value Ref Range   Sodium 136 135 - 145 mmol/L  Potassium 3.9 3.5 - 5.1 mmol/L   Chloride 103 98 - 111 mmol/L   CO2 20 (L) 22 - 32 mmol/L   Glucose, Bld 128 (H) 70 - 99 mg/dL   BUN 33 (H) 8 - 23 mg/dL   Creatinine, Ser 2.65 (H) 0.44 - 1.00 mg/dL   Calcium 8.8 (L) 8.9 -  10.3 mg/dL   GFR, Estimated 18 (L) >60 mL/min   Anion gap 13 5 - 15  Glucose, capillary     Status: Abnormal   Collection Time: 02/12/20 11:28 AM  Result Value Ref Range   Glucose-Capillary 236 (H) 70 - 99 mg/dL   MR ANGIO HEAD WO CONTRAST  Result Date: 02/11/2020 CLINICAL DATA:  Stroke.  Left leg weakness. EXAM: MRI HEAD WITHOUT CONTRAST MRA HEAD WITHOUT CONTRAST TECHNIQUE: Multiplanar, multiecho pulse sequences of the brain and surrounding structures were obtained without intravenous contrast. Angiographic images of the head were obtained using MRA technique without contrast. COMPARISON:  MRI head 02/08/2020.  MRA head 09/10/2018 FINDINGS: MRI HEAD FINDINGS Brain: Interval development of small area of restricted diffusion in the right frontal parietal cortex over the convexity. This appears to be in the motor cortex accounting for the patient's left leg weakness. FLAIR imaging is normal in this area. No other areas of restricted diffusion Chronic infarcts in the right posterior frontal lobe and right occipital lobe unchanged. Mild white matter changes. Limited protocol per neurology. These results were called by telephone at the time of interpretation on 02/11/2020 at 8:30 am to provider  [facility] , who verbally acknowledged these results. MRA HEAD FINDINGS Internal carotid artery widely patent bilaterally. Anterior cerebral arteries patent bilaterally. Left middle cerebral artery widely patent. Stent in the right M1 segment. Loss of flow related signal throughout the right middle cerebral artery occluding M1, M2, M3 branches as noted on the prior MRI a of 09/10/2018. Loss of signal may be due to stent related artifact versus occlusion. Both vertebral arteries patent to the basilar. Right vertebral artery dominant. Basilar widely patent. Right AICA patent. Fetal origin left posterior cerebral artery. Patent right posterior communicating artery. Mild stenosis distal right posterior cerebral artery.  High-grade stenosis versus occlusion left P3 segment, unchanged from the prior MRA. IMPRESSION: 1. Interval development of 5 mm area of acute infarct in the right frontal parietal cortex over the convexity. This appears to be in the motor cortex. FLAIR imaging normal in this region. 2. Chronic infarct right frontal and right occipital lobe unchanged. 3. MRA demonstrates right M1 stent with loss of flow in the right middle cerebral artery unchanged from 2020 MRA. Possible occlusion versus artifact. 4. Bilateral PCA stenosis.  No change from 2020 Electronically Signed   By: Franchot Gallo [name]   On: 02/11/2020 08:43   MR BRAIN WO CONTRAST  Result Date: 02/12/2020 CLINICAL DATA:  Stroke, follow-up. EXAM: MRI HEAD WITHOUT CONTRAST TECHNIQUE: Multiplanar, multiecho pulse sequences of the brain and surrounding structures were obtained without intravenous contrast. COMPARISON:  MRI/MRA head 02/11/2020, noncontrast head CT 02/11/2020, brain MRI 02/08/2020. FINDINGS: Brain: Mild intermittent motion degradation. Mild cerebral and cerebellar atrophy. Two unchanged 4 mm foci of restricted diffusion within the posterior right frontal lobe compatible with acute/early subacute infarction. One of these infarcts involve the motor strip. Since the prior MRI of 02/11/2020, there has been interval development of two foci of acute parenchymal hemorrhage within the paramedian mid and posterior right frontal lobe. The more posterior of the parenchymal hemorrhages measures 1.2 cm and has mild surrounding vasogenic  edema. The focus of parenchymal hemorrhage more anteriorly measures 5 mm. Redemonstrated chronic cortically based infarcts within the posterior right frontal lobe and right occipital lobe. Mild multifocal T2/FLAIR hyperintensity elsewhere within the cerebral white matter is nonspecific, but compatible with chronic small vessel ischemic disease. Small chronic infarct within the left cerebellum. As before, there is mild  hemosiderin deposition associated with the chronic right occipital lobe infarct. Redemonstrated right periatrial and right occipital lobe chronic microhemorrhages. No extra-axial fluid collection. No midline shift. Partially empty sella turcica. Vascular: No appreciable change. Septi artifact arising from a stent within the M1 right middle cerebral artery. Skull and upper cervical spine: No focal marrow lesion. Sinuses/Orbits: Bilateral lens replacements. Visualized orbits show no acute finding. Mild ethmoid sinus mucosal thickening. Small right maxillary sinus mucous retention cyst. These results were called by telephone at the time of interpretation on 02/12/2020 at 9:44 am to provider Dr. Erlinda Hong, who verbally acknowledged these results. IMPRESSION: Interval development of two foci of acute parenchymal hemorrhage within the paramedian mid to posterior right frontal lobe, the largest measuring 1.2 cm with mild surrounding vasogenic edema. Two unchanged 4 mm acute/early subacute infarcts within the posterior right frontal lobe, one involving the motor strip. Otherwise stable non-contrast MRI appearance of the brain. Chronic cortically based infarcts within the right frontal and occipital lobes. Background mild generalized atrophy of the brain and chronic small vessel ischemic disease. Small chronic left cerebellar infarct. Electronically Signed   By: Kellie Simmering DO   On: 02/12/2020 09:45   MR BRAIN WO CONTRAST  Result Date: 02/11/2020 CLINICAL DATA:  Stroke.  Left leg weakness. EXAM: MRI HEAD WITHOUT CONTRAST MRA HEAD WITHOUT CONTRAST TECHNIQUE: Multiplanar, multiecho pulse sequences of the brain and surrounding structures were obtained without intravenous contrast. Angiographic images of the head were obtained using MRA technique without contrast. COMPARISON:  MRI head 02/08/2020.  MRA head 09/10/2018 FINDINGS: MRI HEAD FINDINGS Brain: Interval development of small area of restricted diffusion in the right frontal  parietal cortex over the convexity. This appears to be in the motor cortex accounting for the patient's left leg weakness. FLAIR imaging is normal in this area. No other areas of restricted diffusion Chronic infarcts in the right posterior frontal lobe and right occipital lobe unchanged. Mild white matter changes. Limited protocol per neurology. These results were called by telephone at the time of interpretation on 02/11/2020 at 8:30 am to provider [facility] , who verbally acknowledged these results. MRA HEAD FINDINGS Internal carotid artery widely patent bilaterally. Anterior cerebral arteries patent bilaterally. Left middle cerebral artery widely patent. Stent in the right M1 segment. Loss of flow related signal throughout the right middle cerebral artery occluding M1, M2, M3 branches as noted on the prior MRI a of 09/10/2018. Loss of signal may be due to stent related artifact versus occlusion. Both vertebral arteries patent to the basilar. Right vertebral artery dominant. Basilar widely patent. Right AICA patent. Fetal origin left posterior cerebral artery. Patent right posterior communicating artery. Mild stenosis distal right posterior cerebral artery. High-grade stenosis versus occlusion left P3 segment, unchanged from the prior MRA. IMPRESSION: 1. Interval development of 5 mm area of acute infarct in the right frontal parietal cortex over the convexity. This appears to be in the motor cortex. FLAIR imaging normal in this region. 2. Chronic infarct right frontal and right occipital lobe unchanged. 3. MRA demonstrates right M1 stent with loss of flow in the right middle cerebral artery unchanged from 2020 MRA. Possible occlusion versus artifact.  4. Bilateral PCA stenosis.  No change from 2020 Electronically Signed   By: Franchot Gallo [name]   On: 02/11/2020 08:43   CT HEAD CODE STROKE WO CONTRAST`  Result Date: 02/11/2020 CLINICAL DATA:  Code stroke.  80 year old female EXAM: CT HEAD WITHOUT CONTRAST  TECHNIQUE: Contiguous axial images were obtained from the base of the skull through the vertex without intravenous contrast. COMPARISON:  Brain MRI and head CT 02/08/2020. FINDINGS: Brain: Stable encephalomalacia in the right middle frontal gyrus, right occipital lobe. Stable gray-white matter differentiation throughout the brain. No midline shift, ventriculomegaly, mass effect, evidence of mass lesion, intracranial hemorrhage or evidence of cortically based acute infarction. Vascular: Calcified atherosclerosis at the skull base. Right MCA M1 stent in stable position. No suspicious intracranial vascular hyperdensity. Skull: No acute osseous abnormality identified. Sinuses/Orbits: Visualized paranasal sinuses and mastoids are stable and well pneumatized. Other: Stable orbit and scalp soft tissues. ASPECTS [facility] Stroke Program Early CT Score) Total score (0-10 with 10 being normal): 10 (chronic right hemisphere encephalomalacia) IMPRESSION: 1. No acute or of all vein cortically based infarct identified. Acute intracranial hemorrhage. ASPECTS 10. 2. Right MCA M1 stent. Right middle frontal gyrus and occipital lobe encephalomalacia. 3. These results were communicated to Dr. Curly Shores at 7:49 am on 02/11/2020 by text page via the [facility] messaging system. Electronically Signed   By: Genevie Ann [name]   On: 02/11/2020 07:49     Assessment/Plan: Diagnosis: R ACA infarcts with L hemiparesis 1. Does the need for close, 24 hr/day medical supervision in concert with the patient's rehab needs make it unreasonable for this patient to be served in a less intensive setting? Yes 2. Co-Morbidities requiring supervision/potential complications: CKD 4, baseline Cr 2.5-2.9, HTN, CAD, RA, DM on Lantus at home, constipation, L CVA tenderness 3. Due to bladder management, bowel management, safety, skin/wound care, disease management, medication administration, pain management and patient education, does the patient require 24 hr/day rehab  nursing? Yes 4. Does the patient require coordinated care of a physician, rehab nurse, therapy disciplines of PT and OT to address physical and functional deficits in the context of the above medical diagnosis(es)? Yes Addressing deficits in the following areas: balance, endurance, locomotion, strength, transferring, bowel/bladder control, bathing, dressing, feeding, grooming and toileting 5. Can the patient actively participate in an intensive therapy program of at least 3 hrs of therapy per day at least 5 days per week? Yes 6. The potential for patient to make measurable gains while on inpatient rehab is good 7. Anticipated functional outcomes upon discharge from inpatient rehab are modified independent and supervision  with PT, modified independent and supervision with OT, supervision with SLP- put speech due to visual loss 8. Estimated rehab length of stay to reach the above functional goals is: ~ 2 weeks 9. Anticipated discharge destination: Home 10. Overall Rehab/Functional Prognosis: good  RECOMMENDATIONS: This patient's condition is appropriate for continued rehabilitative care in the following setting: CIR Patient has agreed to participate in recommended program. Potentially Note that insurance prior authorization may be required for reimbursement for recommended care.  Comment:  1. Pt has L CVA tenderness- I know it was last checked 1/22, however I suggest rechecking U/A and Cx due to strong L CVA tenderness- almost jumped for the "chandelier". 2. LBM likely Sunday per pt- good historian- suggest helping her have BM.  3. Will submit for inpt Rehab/CIR admission- will d/w admissions coordinators 4. Thank you for this consult.     Lavon Paganini Angiulli, PA-C 02/12/2020  I have personally performed a face to face diagnostic evaluation of this patient and formulated the key components of the plan.  Additionally, I have personally reviewed laboratory data, imaging studies, as well as  relevant notes and concur with the physician assistant's documentation above.

## 2020-02-12 NOTE — Consult Note (Incomplete)
ELECTROPHYSIOLOGY CONSULT NOTE  Patient ID: [name] MRN: [id number], DOB/AGE: September 25, 1940   Admit date: 02/08/2020 Date of Consult:   *** THIS IS AN INCOMPLETE NOTE FOR A FUTURE CONSULT, PLEASE DO NOT REFERENCE FOR PLAN OF CARE ***  Primary Physician: Susan Hose, MD Primary Cardiologist: No primary care provider on file.  Primary Electrophysiologist: New to Dr. Curt Kennedy (Dr. Rayann Kennedy also previously consulted for LOOP) Reason for Consultation: Cryptogenic stroke; recommendations regarding Implantable Loop Recorder Insurance: [employer]  History of Present Illness EP has been asked to evaluate [name] for placement of an implantable loop recorder to monitor for atrial fibrillation by Dr [name].    [name] is a 80 y.o. female with a history significant for hypertension, hyperlipidemia, type 2 diabetes mellitus, chronic kidney disease stage III/IV, history of stroke (no residual left-sided weakness, right MCA s/p stent in 2015 with subsequent occlusion, right PCA CVA August 2020, multiple TIAs), coronary artery disease, transaortic valve replacement for severe aortic stenosis November 2020  She was previously consulted for LOOP recorder s/p CVA 08/2018. Given severe AS this was deferred and 30 day monitor ordered which showed Sinus brady with HR 52-116 bpm between 9/1 - 09/20/2018 with no arryhtmias.   She underwent TAVR as above in 11/2018  The patient was admitted on 02/08/2020 with severe HTN requiring NTG gtt and transient left arm tingling, numbness, and weakness, along with LLE weakness. Concerns were for TIA with resolution of symptoms and initial MRI negative for acute findings.   She had recurrent left sided weakness on 1/25 prompting Code Stroke. Head CT negative, but newly required 2 person assist to walk. MRI showed acute infarct in R frontal parietal cortex over the convexity and patient received TPa.     F/u MRI today shows two foci of acute parenchymal  hemorrhage within the paramedian mid to posterior right frontal lobe, the largest measuring 1.2 cm with mild surrounding vasogenic edema. Otherwise stable.  She has undergone workup for stroke including: CT head 1/22 No acute abnormality. Remote right frontal and right occipital infarcts with encephalomalacia. MR Brain 1/22 No acute finding. Remote right cerebral infarcts CT head 1/25 No acute changes from 1/22. MRA 1/25 1. Interval development of 5 mm area of acute infarct in the right frontal parietal cortex over the convexity. This appears to be in the motor cortex. FLAIR imaging normal in this region. 2. Chronic infarct right frontal and right occipital lobe unchanged. 3. MRA demonstrates right M1 stent with loss of flow in the right middle cerebral artery unchanged from 2020 MRA. Possible occlusion versus artifact. 4. Bilateral PCA stenosis. No change from 2020 Carotid Doppler: MRA with widely patent internal carotids. No dopplers indicated Echo 12/23/2019 with EF "56%". Repeat not felt to be indicated    The patient has been monitored on telemetry which has demonstrated sinus rhythm with no arrhythmias.  Inpatient stroke work-up will not require a TEE per Neurology.   Echocardiogram as above. Lab work is reviewed.  Prior to admission, the patient denies chest pain, shortness of breath, dizziness, palpitations, or syncope.  {Blank single:19197::"He","She"} is recovering from {Blank single:19197::"his","her"} stroke with plans to {Blank single:19197::"return home","attend CIR","rehab at SNF"}  at discharge.  Past Medical History:  Diagnosis Date  . Arthritis   . Cerebrovascular disease   . CKD (chronic kidney disease)    Sees Dr [name]  . Coronary artery disease   . Depression   . Diabetes ([place])    INSULIN  DEPENDENT  . Diabetic peripheral neuropathy ([place])   . Diabetic retinopathy ([place])   . Diverticulitis   . GERD (gastroesophageal reflux disease)   . History of CVA  (cerebrovascular accident)    x 2 no residulal  . Hyperlipidemia   . Hypertension   . Obesity   . Renal lesion   . S/P TAVR (transcatheter aortic valve replacement) 11/13/2018   s/p TAVR with a 29m Edwards S3U via the TF approach by Dr. ORoxy Mannsand Dr. MAngelena Kennedy . Severe aortic stenosis      Surgical History:  Past Surgical History:  Procedure Laterality Date  . ABDOMINAL HYSTERECTOMY    . AV FISTULA PLACEMENT Left 12/18/2017   Procedure: ARTERIOVENOUS (AV) FISTULA CREATION ARM;  Surgeon: CMarty Heck MD;  [facility]  Service: Vascular;  Laterality: Left;  . bilateral cataract surgery    . CARDIAC CATHETERIZATION  01/07/2014   DR GHainesRESECTION  02/1999  . COLONOSCOPY    . COLOSTOMY  02/1999  . COLOSTOMY CLOSURE  07/1999  . EYE SURGERY Left 2019  . FISTULA SUPERFICIALIZATION Left 06/29/2018   Procedure: FISTULA SUPERFICIALIZATION LEFT BRACHIOCEPHALIC;  Surgeon: CMarty Heck MD;  [facility]  Service: Vascular;  Laterality: Left;  . LEFT HEART CATHETERIZATION WITH CORONARY ANGIOGRAM N/A 01/07/2014   Procedure: LEFT HEART CATHETERIZATION WITH CORONARY ANGIOGRAM;  Surgeon: JLaverda Page MD;  [facility];  Service: Cardiovascular;  Laterality: N/A;  . middle cerebral artery stent placement Right   . OTHER SURGICAL HISTORY     laser surgery  . PTCA  01/07/2014   DES to RCA    DR GEinar Gip . right knee surgery Right    for infection  . RIGHT/LEFT HEART CATH AND CORONARY ANGIOGRAPHY N/A 10/23/2018   Procedure: RIGHT/LEFT HEART CATH AND CORONARY ANGIOGRAPHY;  Surgeon: PNigel Mormon MD;  [facility];  Service: Cardiovascular;  Laterality: N/A;  . TEE WITHOUT CARDIOVERSION N/A 11/13/2018   Procedure: TRANSESOPHAGEAL ECHOCARDIOGRAM (TEE);  Surgeon: MBurnell Blanks MD;  [facility]  Service: Open Heart Surgery;  Laterality: N/A;  . TRANSCATHETER AORTIC VALVE REPLACEMENT, TRANSFEMORAL  11/13/2018  .  TRANSCATHETER AORTIC VALVE REPLACEMENT, TRANSFEMORAL N/A 11/13/2018   Procedure: TRANSCATHETER AORTIC VALVE REPLACEMENT, TRANSFEMORAL;  Surgeon: MBurnell Blanks MD;  [facility]  Service: Open Heart Surgery;  Laterality: N/A;     Medications Prior to Admission  Medication Sig Dispense Refill Last Dose  . acetaminophen (TYLENOL) 325 MG tablet Take 650 mg by mouth every 6 (six) hours as needed for moderate pain or headache.    unk  . allopurinol (ZYLOPRIM) 100 MG tablet Take 50 mg by mouth daily.   02/07/2020 at Unknown time  . amLODipine (NORVASC) 10 MG tablet TAKE 1 TABLET BY MOUTH EVERY DAY (Patient taking differently: Take 10 mg by mouth daily.) 90 tablet 3 02/07/2020 at Unknown time  . amoxicillin (AMOXIL) 500 MG tablet Take 4 tablets (2,000 mg total) by mouth as directed. 30-60 min before dental procedure 4 tablet 6 unk  . B-D INS SYR ULTRAFINE 1CC/30G 30G X 1/2" 1 ML MISC Inject 1 each into the skin 3 (three) times daily between meals.     . calcitRIOL (ROCALTROL) 0.25 MCG capsule Take 0.25 mcg by mouth daily.    02/07/2020 at Unknown time  . cetirizine (ZYRTEC) 10 MG tablet Take 10 mg by mouth daily as needed for allergies.   Past Week at Unknown time  .  Desvenlafaxine ER 100 MG TB24 Take 100 mg by mouth daily.   02/07/2020 at Unknown time  . folic acid (FOLVITE) 1 MG tablet Take 1 mg by mouth daily.   02/07/2020 at Unknown time  . HUMALOG KWIKPEN 100 UNIT/ML KwikPen Inject 2-8 Units into the skin 3 (three) times daily before meals.    02/07/2020 at Unknown time  . insulin glargine (LANTUS) 100 UNIT/ML injection Inject 13 Units into the skin at bedtime.   02/07/2020 at Unknown time  . isosorbide-hydrALAZINE (BIDIL) 20-37.5 MG tablet Take 2 tablets by mouth 3 (three) times daily. 540 tablet 2 02/07/2020 at Unknown time  . labetalol (NORMODYNE) 100 MG tablet Take 1 tablet (100 mg total) by mouth 2 (two) times daily. (Patient taking differently: Take 200 mg by mouth 2 (two) times daily.)  180 tablet 2 02/07/2020 at 2200  . leflunomide (ARAVA) 10 MG tablet Take 10 mg by mouth daily.   02/07/2020 at Unknown time  . niacin (NIASPAN) 500 MG CR tablet Take 500 mg by mouth daily.    02/07/2020 at Unknown time  . nortriptyline (PAMELOR) 10 MG capsule Take 10 mg by mouth daily.   02/07/2020 at Unknown time  . ONE TOUCH ULTRA TEST test strip 1 each by Other route 3 (three) times daily between meals.     . polyvinyl alcohol (LIQUIFILM TEARS) 1.4 % ophthalmic solution Place 1 drop into both eyes 3 (three) times daily as needed for dry eyes.   Past Week at Unknown time  . rosuvastatin (CRESTOR) 20 MG tablet Take 20 mg by mouth daily.   02/07/2020 at Unknown time  . torsemide (DEMADEX) 20 MG tablet Take 1 tablet (20 mg total) by mouth daily. May take additional dose in the afternoon for swelling, shortness of breath or weight gain. (Patient taking differently: Take 40 mg by mouth daily. May take additional dose in the afternoon for swelling, shortness of breath or weight gain.) 180 tablet 0 02/07/2020 at Unknown time  . brimonidine (ALPHAGAN) 0.2 % ophthalmic solution  (Patient not taking: Reported on 02/08/2020)   Not Taking at Unknown time  . nitroGLYCERIN (NITROSTAT) 0.4 MG SL tablet PLACE 1 TABLET (0.4 MG TOTAL) UNDER THE TONGUE EVERY 5 MINUTES X 3 DOSES AS NEEDED FOR CHEST PAIN. (Patient taking differently: Place 0.4 mg under the tongue every 5 (five) minutes as needed for chest pain.) 25 tablet 1 unk    Inpatient Medications:  . amLODipine  10 mg Oral Daily  . Chlorhexidine Gluconate Cloth  6 each Topical Daily  . hydrALAZINE  50 mg Oral Q8H  . insulin aspart  0-15 Units Subcutaneous TID WC  . insulin glargine  10 Units Subcutaneous QHS  . isosorbide mononitrate  30 mg Oral Daily  . labetalol  200 mg Oral BID  . LORazepam  0.5 mg Intravenous Once  . rosuvastatin  40 mg Oral Daily  . torsemide  40 mg Oral Daily  . venlafaxine XR  150 mg Oral Daily    Allergies: No Known Allergies  Social  History   Socioeconomic History  . Marital status: Widowed    Spouse name: Not on file  . Number of children: 2  . Years of education: PHD  . Highest education level: Not on file  Occupational History  . Occupation: Retired PhD Pharmacist, hospital [employer]  Tobacco Use  . Smoking status: Never Smoker  . Smokeless tobacco: Never Used  Vaping Use  . Vaping Use: Never used  Substance and Sexual Activity  .  Alcohol use: No  . Drug use: No  . Sexual activity: Not on file  Other Topics Concern  . Not on file  Social History Narrative   Patient is widowed and lives with her two adopted children.   Patient is retired.   Patient has two children of her own and two adopted children.   Patient has a PHD.   Patient is right-handed.   Social Determinants of Health   Financial Resource Strain: Not on file  Food Insecurity: Not on file  Transportation Needs: Not on file  Physical Activity: Not on file  Stress: Not on file  Social Connections: Not on file  Intimate Partner Violence: Not on file     Family History  Problem Relation Age of Onset  . Diabetes Mother   . Heart disease Mother   . Prostate cancer Father   . Hypertension Brother   . Prostate cancer Brother       Review of Systems: All other systems reviewed and are otherwise negative except as noted above.  Physical Exam: Vitals:   02/12/20 0600 02/12/20 0700 02/12/20 0800 02/12/20 0824  BP: (!) 150/50 (!) 158/62 (!) 148/50   Pulse: (!) 59 68 61   Resp: '17 13 16   '$ Temp:    97.7 F (36.5 C)  TempSrc:   Oral Oral  SpO2: 100% 97% 100%   Weight:      Height:        GEN- The patient is well appearing, alert and oriented x 3 today.   Head- normocephalic, atraumatic Eyes-  Sclera clear, conjunctiva pink Ears- hearing intact Oropharynx- clear Neck- supple Lungs- Clear to ausculation bilaterally, normal work of breathing Heart- Regular rate and rhythm, no murmurs, rubs or gallops  GI- soft, NT, ND, + BS Extremities- no  clubbing, cyanosis, or edema MS- no significant deformity or atrophy Skin- no rash or lesion Psych- euthymic mood, full affect   Labs:   Lab Results  Component Value Date   WBC 7.0 02/12/2020   HGB 11.4 (L) 02/12/2020   HCT 34.2 (L) 02/12/2020   MCV 89.3 02/12/2020   PLT 120 (L) 02/12/2020    Recent Labs  Lab 02/12/20 0653  NA 136  K 3.9  CL 103  CO2 20*  BUN 33*  CREATININE 2.65*  CALCIUM 8.8*  GLUCOSE 128*     Radiology/Studies: CT HEAD WO CONTRAST  Result Date: 02/08/2020 CLINICAL DATA:  History of prior strokes, acute left hand paresthesias, heavy legs EXAM: CT HEAD WITHOUT CONTRAST TECHNIQUE: Contiguous axial images were obtained from the base of the skull through the vertex without intravenous contrast. COMPARISON:  09/09/2018 FINDINGS: Brain: Chronic encephalomalacia in the central right frontal lobe and the right occipital lobe from remote infarcts. No acute intracranial hemorrhage, area of new infarction, mass lesion, midline shift, herniation or hydrocephalus. No extra-axial fluid collection. No focal mass effect or edema. cisterns are patent. No cerebellar abnormality. Vascular: Intracranial atherosclerosis at the skull base. Chronic right MCA M1 vascular stent again no. Skull: Normal. Negative for fracture or focal lesion. Sinuses/Orbits: No acute finding. Other: None. IMPRESSION: Remote right frontal and right occipital infarcts with encephalomalacia. No acute intracranial finding by noncontrast CT. Electronically Signed   By: Jerilynn Mages.  Shick Susan.D.   On: 02/08/2020 06:53   MR ANGIO HEAD WO CONTRAST  Result Date: 02/11/2020 CLINICAL DATA:  Stroke.  Left leg weakness. EXAM: MRI HEAD WITHOUT CONTRAST MRA HEAD WITHOUT CONTRAST TECHNIQUE: Multiplanar, multiecho pulse sequences of the brain and  surrounding structures were obtained without intravenous contrast. Angiographic images of the head were obtained using MRA technique without contrast. COMPARISON:  MRI head 02/08/2020.  MRA  head 09/10/2018 FINDINGS: MRI HEAD FINDINGS Brain: Interval development of small area of restricted diffusion in the right frontal parietal cortex over the convexity. This appears to be in the motor cortex accounting for the patient's left leg weakness. FLAIR imaging is normal in this area. No other areas of restricted diffusion Chronic infarcts in the right posterior frontal lobe and right occipital lobe unchanged. Mild white matter changes. Limited protocol per neurology. These results were called by telephone at the time of interpretation on 02/11/2020 at 8:30 am to provider [facility] , who verbally acknowledged these results. MRA HEAD FINDINGS Internal carotid artery widely patent bilaterally. Anterior cerebral arteries patent bilaterally. Left middle cerebral artery widely patent. Stent in the right M1 segment. Loss of flow related signal throughout the right middle cerebral artery occluding M1, M2, M3 branches as noted on the prior MRI a of 09/10/2018. Loss of signal may be due to stent related artifact versus occlusion. Both vertebral arteries patent to the basilar. Right vertebral artery dominant. Basilar widely patent. Right AICA patent. Fetal origin left posterior cerebral artery. Patent right posterior communicating artery. Mild stenosis distal right posterior cerebral artery. High-grade stenosis versus occlusion left P3 segment, unchanged from the prior MRA. IMPRESSION: 1. Interval development of 5 mm area of acute infarct in the right frontal parietal cortex over the convexity. This appears to be in the motor cortex. FLAIR imaging normal in this region. 2. Chronic infarct right frontal and right occipital lobe unchanged. 3. MRA demonstrates right M1 stent with loss of flow in the right middle cerebral artery unchanged from 2020 MRA. Possible occlusion versus artifact. 4. Bilateral PCA stenosis.  No change from 2020 Electronically Signed   By: Franchot Gallo Susan.D.   On: 02/11/2020 08:43   MR BRAIN  WO CONTRAST  Result Date: 02/12/2020 CLINICAL DATA:  Stroke, follow-up. EXAM: MRI HEAD WITHOUT CONTRAST TECHNIQUE: Multiplanar, multiecho pulse sequences of the brain and surrounding structures were obtained without intravenous contrast. COMPARISON:  MRI/MRA head 02/11/2020, noncontrast head CT 02/11/2020, brain MRI 02/08/2020. FINDINGS: Brain: Mild intermittent motion degradation. Mild cerebral and cerebellar atrophy. Two unchanged 4 mm foci of restricted diffusion within the posterior right frontal lobe compatible with acute/early subacute infarction. One of these infarcts involve the motor strip. Since the prior MRI of 02/11/2020, there has been interval development of two foci of acute parenchymal hemorrhage within the paramedian mid and posterior right frontal lobe. The more posterior of the parenchymal hemorrhages measures 1.2 cm and has mild surrounding vasogenic edema. The focus of parenchymal hemorrhage more anteriorly measures 5 mm. Redemonstrated chronic cortically based infarcts within the posterior right frontal lobe and right occipital lobe. Mild multifocal T2/FLAIR hyperintensity elsewhere within the cerebral white matter is nonspecific, but compatible with chronic small vessel ischemic disease. Small chronic infarct within the left cerebellum. As before, there is mild hemosiderin deposition associated with the chronic right occipital lobe infarct. Redemonstrated right periatrial and right occipital lobe chronic microhemorrhages. No extra-axial fluid collection. No midline shift. Partially empty sella turcica. Vascular: No appreciable change. Septi artifact arising from a stent within the M1 right middle cerebral artery. Skull and upper cervical spine: No focal marrow lesion. Sinuses/Orbits: Bilateral lens replacements. Visualized orbits show no acute finding. Mild ethmoid sinus mucosal thickening. Small right maxillary sinus mucous retention cyst. These results were called by telephone at the  time  of interpretation on 02/12/2020 at 9:44 am to provider Dr. Erlinda Kennedy, who verbally acknowledged these results. IMPRESSION: Interval development of two foci of acute parenchymal hemorrhage within the paramedian mid to posterior right frontal lobe, the largest measuring 1.2 cm with mild surrounding vasogenic edema. Two unchanged 4 mm acute/early subacute infarcts within the posterior right frontal lobe, one involving the motor strip. Otherwise stable non-contrast MRI appearance of the brain. Chronic cortically based infarcts within the right frontal and occipital lobes. Background mild generalized atrophy of the brain and chronic small vessel ischemic disease. Small chronic left cerebellar infarct. Electronically Signed   By: Kellie Simmering DO   On: 02/12/2020 09:45   MR BRAIN WO CONTRAST  Result Date: 02/11/2020 CLINICAL DATA:  Stroke.  Left leg weakness. EXAM: MRI HEAD WITHOUT CONTRAST MRA HEAD WITHOUT CONTRAST TECHNIQUE: Multiplanar, multiecho pulse sequences of the brain and surrounding structures were obtained without intravenous contrast. Angiographic images of the head were obtained using MRA technique without contrast. COMPARISON:  MRI head 02/08/2020.  MRA head 09/10/2018 FINDINGS: MRI HEAD FINDINGS Brain: Interval development of small area of restricted diffusion in the right frontal parietal cortex over the convexity. This appears to be in the motor cortex accounting for the patient's left leg weakness. FLAIR imaging is normal in this area. No other areas of restricted diffusion Chronic infarcts in the right posterior frontal lobe and right occipital lobe unchanged. Mild white matter changes. Limited protocol per neurology. These results were called by telephone at the time of interpretation on 02/11/2020 at 8:30 am to provider [facility] , who verbally acknowledged these results. MRA HEAD FINDINGS Internal carotid artery widely patent bilaterally. Anterior cerebral arteries patent bilaterally. Left middle  cerebral artery widely patent. Stent in the right M1 segment. Loss of flow related signal throughout the right middle cerebral artery occluding M1, M2, M3 branches as noted on the prior MRI a of 09/10/2018. Loss of signal may be due to stent related artifact versus occlusion. Both vertebral arteries patent to the basilar. Right vertebral artery dominant. Basilar widely patent. Right AICA patent. Fetal origin left posterior cerebral artery. Patent right posterior communicating artery. Mild stenosis distal right posterior cerebral artery. High-grade stenosis versus occlusion left P3 segment, unchanged from the prior MRA. IMPRESSION: 1. Interval development of 5 mm area of acute infarct in the right frontal parietal cortex over the convexity. This appears to be in the motor cortex. FLAIR imaging normal in this region. 2. Chronic infarct right frontal and right occipital lobe unchanged. 3. MRA demonstrates right M1 stent with loss of flow in the right middle cerebral artery unchanged from 2020 MRA. Possible occlusion versus artifact. 4. Bilateral PCA stenosis.  No change from 2020 Electronically Signed   By: Franchot Gallo Susan.D.   On: 02/11/2020 08:43   MR BRAIN WO CONTRAST  Result Date: 02/08/2020 CLINICAL DATA:  Tingling and numbness in the left hand. EXAM: MRI HEAD WITHOUT CONTRAST TECHNIQUE: Multiplanar, multiecho pulse sequences of the brain and surrounding structures were obtained without intravenous contrast. COMPARISON:  09/10/2018 FINDINGS: Brain: Moderate remote infarcts in the right superior frontal and occipital lobes. No acute infarct, hemorrhage, hydrocephalus, or masslike finding. Chronic blood products associated with the remote infarcts and the superior left frontal cortex. Vascular: Normal flow voids.  There is a right MCA stent present. Skull and upper cervical spine: Normal marrow signal Sinuses/Orbits: Bilateral cataract resection. Mucosal thickening in the paranasal sinuses. IMPRESSION: 1. No  acute finding. 2. Remote right cerebral infarcts. Electronically  Signed   By: Monte Fantasia Susan.D.   On: 02/08/2020 09:26   MR ABDOMEN WO CONTRAST  Result Date: 01/26/2020 CLINICAL DATA:  Follow-up renal lesions.  End-stage renal disease. EXAM: MRI ABDOMEN WITHOUT CONTRAST TECHNIQUE: Multiplanar multisequence MR imaging was performed without the administration of intravenous contrast. COMPARISON:  12/30/2018 and 09/15/2015 FINDINGS: Lower chest: No acute findings. Hepatobiliary: No masses visualized on this unenhanced exam. Gallbladder is unremarkable. No evidence of biliary ductal dilatation. Pancreas: No mass or inflammatory process visualized on this unenhanced exam. Spleen:  Within normal limits in size. Adrenals/Urinary tract: Normal adrenal glands. Several Bosniak category 2 hemorrhagic cysts are seen in both kidneys, the largest in the posterior midpole of the left kidney measuring 3.2 cm and showing mild increase in size since previous study. A complex cyst with heterogeneous hemorrhagic signal intensity and thin internal septations is again seen in the lower pole of the left kidney which measures 3.2 x 2.4 cm on image 30/7. This shows no significant change in morphology or size compared to previous studies. Another nonhemorrhagic cystic lesion is seen in the lower pole of the right kidney which contains a few thin internal septations and measures 1.9 x 1.7 cm on image 32/7. This also shows no significant change compared to prior exam. These are consistent with probably benign Bosniak category 2 F lesions. Stomach/Bowel: Visualized portion unremarkable. Vascular/Lymphatic: No pathologically enlarged lymph nodes identified. No evidence of abdominal aortic aneurysm. Other:  None. Musculoskeletal:  No suspicious bone lesions identified. IMPRESSION: No significant change in probably benign Bosniak category 2 F cystic lesions in both kidneys. Recommend a final follow-up MRI exam in 1 year (to confirm stability  over a period of at least 5 years since earliest comparison study in 2017). This recommendation follows ACR consensus guidelines: Management of the Incidental Renal Mass on CT: A White Paper of the ACR Incidental Findings Committee. J Am Coll Radiol [phone]. Other benign Bosniak category 2 hemorrhagic renal cysts, one in the lower pole the left kidney showing mild increase in size. Electronically Signed   By: Marlaine Hind Susan.D.   On: 01/26/2020 12:37   CT HEAD CODE STROKE WO CONTRAST`  Result Date: 02/11/2020 CLINICAL DATA:  Code stroke.  80 year old female EXAM: CT HEAD WITHOUT CONTRAST TECHNIQUE: Contiguous axial images were obtained from the base of the skull through the vertex without intravenous contrast. COMPARISON:  Brain MRI and head CT 02/08/2020. FINDINGS: Brain: Stable encephalomalacia in the right middle frontal gyrus, right occipital lobe. Stable gray-white matter differentiation throughout the brain. No midline shift, ventriculomegaly, mass effect, evidence of mass lesion, intracranial hemorrhage or evidence of cortically based acute infarction. Vascular: Calcified atherosclerosis at the skull base. Right MCA M1 stent in stable position. No suspicious intracranial vascular hyperdensity. Skull: No acute osseous abnormality identified. Sinuses/Orbits: Visualized paranasal sinuses and mastoids are stable and well pneumatized. Other: Stable orbit and scalp soft tissues. ASPECTS [facility] Stroke Program Early CT Score) Total score (0-10 with 10 being normal): 10 (chronic right hemisphere encephalomalacia) IMPRESSION: 1. No acute or of all vein cortically based infarct identified. Acute intracranial hemorrhage. ASPECTS 10. 2. Right MCA M1 stent. Right middle frontal gyrus and occipital lobe encephalomalacia. 3. These results were communicated to Dr. Curly Shores at 7:49 am on 02/11/2020 by text page via the [facility] messaging system. Electronically Signed   By: Genevie Ann Susan.D.   On: 02/11/2020 07:49     12-lead ECG on arrival shows NSR at 64 bpm (personally reviewed) All prior EKG's in  EPIC reviewed with no documented atrial fibrillation. Previous EKGs shows NSR and sinus brady.  Telemetry *** (personally reviewed)  Assessment and Plan:  1. Cryptogenic stroke The patient presents with cryptogenic stroke.  The patient does not have a TEE planned for this AM.  I spoke at length with the patient about monitoring for afib with an implantable loop recorder.  Risks, benefits, and alteratives to implantable loop recorder were discussed with the patient today.   At this time, {Blank single:19197::"the patient is very clear in their decision to proceed with implantable loop recorder.","the patient refuses loop consideration, and has opted to wear an event monitor","the patient is not a loop candidate, and has opted to wear an event monitor","refuses monitoring","***"}   {Blank single:19197::"Wound care was reviewed with the patient (keep incision clean and dry for 3 days).  Wound check scheduled and entered in AVS. Please call with questions.","Please call with questions."}   *** If yes, alert cath lab to add to MD schedule ***  Shirley Friar, PA-C 02/12/2020 10:01 AM

## 2020-02-12 NOTE — Plan of Care (Signed)

## 2020-02-12 NOTE — Progress Notes (Signed)
Physical Therapy Re- Evaluation   Patient Details Name: [name] MRN: [id number] DOB: [birth year] Today's Date: 02/12/2020    History of Present Illness Pt is an 80 y/o female admitted secondary to L sided weakness. Also found to be hypertensive. MRI negative. PMH includes CAD, DM, CVA, TAVR, and CKD.    PT Comments    Code stroke was called on patient yesterday and she received tPA. Pt with worsening L LE weakness compared to initial eval on Monday. Pt also more lethargic however pt did receive ativan earlier in the day. Pt requiring modA for sit to stand and std pvt to [facility]. Pt with difficulty sequencing L LE during transfers and even manually assisting it despite loosing balance. Pt with strong L lateral lean in standing. Pt with report "My left leg feels so heavy." Attempted marching in place in walker however pt became nauseated and had a hard time keeping eyes open. At this time PT recommending CIR as pt was indep without AD and living alone PTA. PT to continue to follow and progress mobility and assess d/c recommendations as pt may significantly improve when ativan wears off and she is more awake.   Follow Up Recommendations  CIR     Equipment Recommendations  Rolling walker with 5" wheels    Recommendations for Other Services       Precautions / Restrictions Precautions Precautions: Fall Restrictions Weight Bearing Restrictions: No    Mobility  Bed Mobility               General bed mobility comments: pt up in chair upon PT arrival  Transfers Overall transfer level: Needs assistance Equipment used: 1 person hand held assist Transfers: Sit to/from Stand;Stand Pivot Transfers Sit to Stand: Mod assist Stand pivot transfers: Mod assist       General transfer comment: pt with retropulsion with standing, max verbal cues for hand placement, modA to power up and steady during transition of hands to RW, minA to advance L LE, increased time, difficulty sequencing  stepping to/from BSC/Chair  Ambulation/Gait             General Gait Details: attempted to march in place in RW however pt with strong L lateral lean, with tactile and verbal cues able to correct but then became nauseated and was very sleepy   Stairs             Wheelchair Mobility    Modified Rankin (Stroke Patients Only)       Balance Overall balance assessment: Needs assistance Sitting-balance support: Bilateral upper extremity supported;Feet supported Sitting balance-Leahy Scale: Poor Sitting balance - Comments: L lateral lean   Standing balance support: Single extremity supported;During functional activity Standing balance-Leahy Scale: Poor Standing balance comment: supported self with L UE and performed pericare with R LE in standing with modA to maintain balance                            Cognition Arousal/Alertness: Lethargic;Suspect due to medications (had ativan earlier this morning for a test) Behavior During Therapy: Flat affect Overall Cognitive Status: Impaired/Different from baseline Area of Impairment: Problem solving                             Problem Solving: Slow processing;Difficulty sequencing General Comments: pt sleepy, per OT pt received ativan earlier in the day      Exercises  General Comments General comments (skin integrity, edema, etc.): assisted to [facility], modA for balance while pt performed pericare, pt leaning forward on commode requiring assist to maintain upright position      Pertinent Vitals/Pain Pain Assessment: No/denies pain    Home Living                      Prior Function            PT Goals (current goals can now be found in the care plan section) Acute Rehab PT Goals PT Goal Formulation: With patient Time For Goal Achievement: 02/24/20 Potential to Achieve Goals: Good Progress towards PT goals: Progressing toward goals    Frequency    Min 4X/week      PT Plan  Discharge plan needs to be updated    Co-evaluation              AM-PAC PT "6 Clicks" Mobility   Outcome Measure  Help needed turning from your back to your side while in a flat bed without using bedrails?: A Little Help needed moving from lying on your back to sitting on the side of a flat bed without using bedrails?: A Little Help needed moving to and from a bed to a chair (including a wheelchair)?: A Lot Help needed standing up from a chair using your arms (e.g., wheelchair or bedside chair)?: A Lot Help needed to walk in hospital room?: A Lot Help needed climbing 3-5 steps with a railing? : A Lot 6 Click Score: 14    End of Session Equipment Utilized During Treatment: Gait belt Activity Tolerance: Patient limited by lethargy Patient left: in chair;with call bell/phone within reach;with family/visitor present (dtr) Nurse Communication: Mobility status PT Visit Diagnosis: Unsteadiness on feet (R26.81);Muscle weakness (generalized) (M62.81);Difficulty in walking, not elsewhere classified (R26.2)     Time: [id number] PT Time Calculation (min) (ACUTE ONLY): 22 min  Charges:                        Kittie Plater, PT, DPT Acute Rehabilitation Services Pager #: [phone] Office #: [phone]    Berline Lopes 02/12/2020, 1:20 PM

## 2020-02-12 NOTE — Progress Notes (Signed)
  Note changes from today including hemorrhagic conversion.  EP will follow along for timing of loop recorder/discharge timing.   Legrand Como [address]" [place], [place]  02/12/2020 3:36 PM

## 2020-02-12 NOTE — Evaluation (Signed)
Occupational Therapy Evaluation Patient Details Name: [name] MRN: [id number] DOB: 03/28/1940 Today's Date: 02/12/2020    History of Present Illness Pt is an 80 y/o female admitted secondary to L sided weakness. Also found to be hypertensive. MRI negative. PMH includes CAD, DM, CVA, TAVR, and CKD.   Clinical Impression   Patient admitted for the diagnosis above.  Patient received ativan earlier to prepare for an MRI, noted to be lethargic and having difficulty with sit and stand balance.  PTA she was living alone, and was independent.  Patient was driving short distances, but family wants her to stop driving altogether.  Currently she is needing up to Mod A for basic transfers and lower body ADL, and supervision for upper body ADL sitting.  Acute OT is indicated to maximize her functional status for an eventual return home.  CIR has been recommended if needed.      Follow Up Recommendations  CIR    Equipment Recommendations  None recommended by OT    Recommendations for Other Services       Precautions / Restrictions Precautions Precautions: Fall Restrictions Weight Bearing Restrictions: No      Mobility Bed Mobility Overal bed mobility: Needs Assistance Bed Mobility: Supine to Sit     Supine to sit: Min assist     General bed mobility comments: pt up in chair upon PT arrival    Transfers Overall transfer level: Needs assistance Equipment used: 1 person hand held assist Transfers: Sit to/from Stand;Stand Pivot Transfers Sit to Stand: Min assist Stand pivot transfers: Min assist       General transfer comment: pt with retropulsion with standing, max verbal cues for hand placement, modA to power up and steady during transition of hands to RW, minA to advance L LE, increased time, difficulty sequencing stepping to/from BSC/Chair    Balance Overall balance assessment: Needs assistance Sitting-balance support: Bilateral upper extremity supported;Feet  supported Sitting balance-Leahy Scale: Fair Sitting balance - Comments: posterior lean   Standing balance support: During functional activity;Bilateral upper extremity supported Standing balance-Leahy Scale: Poor Standing balance comment: needs external support                           ADL either performed or assessed with clinical judgement   ADL Overall ADL's : Needs assistance/impaired Eating/Feeding: Set up;Sitting   Grooming: Set up;Sitting           Upper Body Dressing : Minimal assistance;Sitting   Lower Body Dressing: Moderate assistance;Sitting/lateral leans               Functional mobility during ADLs: Minimal assistance General ADL Comments: multiple LOB backwards     Vision Baseline Vision/History: Wears glasses Wears Glasses: Reading only Patient Visual Report: No change from baseline       Perception     Praxis      Pertinent Vitals/Pain Pain Assessment: No/denies pain     Hand Dominance Right   Extremity/Trunk Assessment Upper Extremity Assessment Upper Extremity Assessment: Generalized weakness   Lower Extremity Assessment Lower Extremity Assessment: Defer to PT evaluation   Cervical / Trunk Assessment Cervical / Trunk Assessment: Normal   Communication Communication Communication: No difficulties   Cognition Arousal/Alertness: Lethargic;Suspect due to medications Behavior During Therapy: Flat affect Overall Cognitive Status: Impaired/Different from baseline Area of Impairment: Problem solving  Problem Solving: Slow processing General Comments: pt received ativan earlier for scheduled MRI   General Comments    Exercises     Shoulder Instructions      Home Living Family/patient expects to be discharged to:: Private residence Living Arrangements: Alone Available Help at Discharge: Family;Available 24 hours/day Type of Home: House Home Access: Stairs to enter [employer] of Steps: 2 Entrance Stairs-Rails: None Home Layout: Two level Alternate Level Stairs-Number of Steps: flight   Bathroom Shower/Tub: Occupational psychologist: Standard     Home Equipment: Marine scientist - single point          Prior Functioning/Environment Level of Independence: Independent                 OT Problem List: Decreased strength;Decreased activity tolerance;Impaired balance (sitting and/or standing)      OT Treatment/Interventions: Self-care/ADL training;Therapeutic exercise;Balance training;Patient/family education;Therapeutic activities    OT Goals(Current goals can be found in the care plan section) Acute Rehab OT Goals Patient Stated Goal: To return home OT Goal Formulation: With patient Time For Goal Achievement: 02/12/20 Potential to Achieve Goals: Good ADL Goals Pt Will Perform Grooming: with set-up;sitting;standing Pt Will Perform Lower Body Bathing: with set-up;sit to/from stand Pt Will Perform Lower Body Dressing: with set-up;sit to/from stand Pt Will Transfer to Toilet: with supervision;ambulating;regular height toilet Pt Will Perform Toileting - Clothing Manipulation and hygiene: with supervision;sit to/from stand  OT Frequency: Min 2X/week   Barriers to D/C:    nne noted       Co-evaluation              AM-PAC OT "6 Clicks" Daily Activity     Outcome Measure Help from another person eating meals?: None Help from another person taking care of personal grooming?: A Little Help from another person toileting, which includes using toliet, bedpan, or urinal?: A Lot Help from another person bathing (including washing, rinsing, drying)?: A Lot Help from another person to put on and taking off regular upper body clothing?: A Little Help from another person to put on and taking off regular lower body clothing?: A Lot 6 Click Score: 16   End of Session Equipment Utilized During Treatment: Gait belt Nurse  Communication: Mobility status  Activity Tolerance: Patient tolerated treatment well Patient left: in chair;with call bell/phone within reach;with family/visitor present  OT Visit Diagnosis: Unsteadiness on feet (R26.81);Muscle weakness (generalized) (M62.81);Dizziness and giddiness (R42)                Time: [id number] OT Time Calculation (min): 19 min Charges:  OT General Charges $OT Visit: 1 Visit OT Evaluation $OT Eval Moderate Complexity: 1 Mod  02/12/2020  Rich, OTR/L  Acute Rehabilitation Services  Office:  [phone]   [name] 02/12/2020, 1:46 PM

## 2020-02-12 NOTE — Progress Notes (Addendum)
STROKE TEAM PROGRESS NOTE   INTERVAL HISTORY No acute events since arrival.  Patient requested sedation for MRI. She is drowsy post Ativan administration during our visit.  Her daughter, Theadora Rama, is at the bedside.  She denies any new concerns today. She feels better overall and endorses improved strength in her left hemibody. She denies headache, new numbness or tingling or weakness, difficulty speaking or thinking today.   Vitals:   02/12/20 0600 02/12/20 0700 02/12/20 0800 02/12/20 0824  BP: (!) 150/50 (!) 158/62 (!) 148/50   Pulse: (!) 59 68 61   Resp: '17 13 16   '$ Temp:    97.7 F (36.5 C)  TempSrc:   Oral Oral  SpO2: 100% 97% 100%   Weight:      Height:       CBC:  Recent Labs  Lab 02/11/20 0140 02/12/20 0653  WBC 8.1 7.0  HGB 11.3* 11.4*  HCT 36.2 34.2*  MCV 91.6 89.3  PLT 109* [id number]*   Basic Metabolic Panel:  Recent Labs  Lab 02/11/20 0140 02/12/20 0653  NA 139 136  K 4.7 3.9  CL 107 103  CO2 21* 20*  GLUCOSE 145* 128*  BUN 27* 33*  CREATININE 2.49* 2.65*  CALCIUM 9.1 8.8*   Lipid Panel:  Recent Labs  Lab 02/11/20 1625  CHOL 296*  TRIG 148  HDL 67  CHOLHDL 4.4  VLDL 30  LDLCALC 199*   HgbA1c:  Recent Labs  Lab 02/11/20 1600  HGBA1C 7.7*   Urine Drug Screen:  Recent Labs  Lab 02/08/20 1328  LABOPIA NONE DETECTED  COCAINSCRNUR NONE DETECTED  LABBENZ NONE DETECTED  AMPHETMU NONE DETECTED  THCU NONE DETECTED  LABBARB NONE DETECTED    Alcohol Level No results for input(s): ETH in the last 168 hours.  IMAGING past 24 hours No results found.  PHYSICAL EXAM Neuro:   Mental Status: Patient is somnolent post Ativan. She does not respond to verbal stimuli but awakens with tactile stimuli. She remains mildly somnolent and requires stimulation for participating with exam. She is oriented to person, place, month, year, age, and situation.  She follows two step commands without difficulty.  Speech is clear without aphasia or dysarthria. No  signs of neglect seen. Cranial Nerves: II: Visual Fields are full except left upper quadrantanopia. Pupils are equal, round, and reactive to light 3 mm/brisk. III,IV, VI: EOMI without ptosis or diploplia.   V: Facial sensation is symmetric to light touch V1-V3 VII: Facial movement is symmetric resting and smiling VIII: Hearing is intact to voice X: Palate elevates symmetrically, phonation intact XI: Shoulder shrug is symmetric. XII: Tongue protrudes midline Motor: Tone is normal. Bulk is normal. Strength is 5/5 in bilat UE. No drift. Bilat LE strength is 5/5 RLE, there is drift in the LLE and mild proximal weakness 4/5. She is moving both legs spontaneously and purposefully.  Sensory:  Sensation equal and symmetric to bilateral upper extremities and lower extremities.  Plantars: Toes are downgoing bilaterally.  Cerebellar: FNF and HKS are intact bilaterally. Gait: Deferred  ASSESSMENT/PLAN Brief summary of Dr. Lyn Records H&P: ROBI EAVEY is a 80 y.o. female with a history significant for hypertension, hyperlipidemia, type 2 diabetes mellitus, chronic kidney disease stage III/IV, acute on chronic diastolic heart failure, history of stroke (no residual left-sided weakness, right MCA s/p stent in 2015 with subsequent occlusion, right PCA CVA August 2020, multiple TIAs), CRVO 2020, coronary artery disease, transaortic valve replacement for severe aortic stenosis November 2020. She was  admitted to [facility] on 02/08/20 for severe hypertension requiring nitroglycerin infusion and transient/varying degrees of left arm tingling, numbness, weakness, and left lower extremity. MRI neg for stroke. AT 06:45 1/25 the patient experienced sudden onset left lower extremity numbness and left lower extremity and left arm weakness prompting a stroke alert activation.  Her symptoms waxed and waned initially.  Head CT did not show any acute intracranial abnormality, however on gait evaluation she required 2 person assist to  ambulate a few steps and was quite unsteady. Given her risk factors MRI brain was obtained to further clarify.  As this revealed an acute infarct in a location that explain her symptoms well, risks and benefits of tPA were discussed with the patient and decision was made to proceed with tPA.  Despite her low total NIH stroke scale, given the significant impairment of her ambulation this was felt to be a disabling symptom.  Stroke: 2 punctate infarcts in the right ACA cortex s/p tPA, concerning for  cardioembolic etiology.  Hemorrhagic conversion - 2 two new foci of acute hemorrhagic conversion within the paramedian mid to posterior right frontal lobe  CT head 1/22 and 1/25: No acute abnormality. Remote right frontal and right occipital infarcts with encephalomalacia.   MRI 1/22 - no acute infarct, remote right cerebral infarcts  MRI 1/25 Interval development of 5 mm area of acute infarct in the right frontal parietal cortex over the convexity. Chronic infarct right frontal and right occipital lobe unchanged.   MRA 1/25 demonstrates right M1 stent with loss of flow in the right middle cerebral artery unchanged from 2020 MRA. Possible occlusion versus artifact. Bilateral PCA stenosis. No change from 2020  MRI 1/26 Interval development of two foci of acute parenchymal hemorrhage within the paramedian mid to posterior right frontal lobe, the largest measuring 1.2 cm with mild surrounding vasogenic edema.  Plan to repeat HCT at 1800 today to re-eval hemorrhages.   2D Echo 12/23/2019 EF 56%.Left atrial cavity is mildly dilated. Well seated Wende Crease Sapien 3 bioprosthetic aortic valve with normal functioning.  LDL 199  HgbA1c 7.7  VTE prophylaxis: SCDs for now  Recommend loop recorder to rule out A. fib. EP team contacted for consideration of Loop placement.   Patient was on Plavix monotherapy prior to admission, now HOLD all antiplatelets given hemorrhagic conversion.  Will resume once  brain hemorrhage absorbed or near absorbed.  Therapy recommendations:  CIR  Disposition:  pending  History of stroke  Remote right frontal stroke status post right M1 stent, however documented stent occlusion later  08/2018 admitted for right PCA infarct with left upper quadrantanopsia.  CT showed acute/subacute right PCA infarct, old right MCA infarct with right M1 stent.  MRI right occipital infarct.  MRA right M1 stent, left P3 occlusion.  Carotid Doppler negative.  EF 60 to 65%.  DVT negative.  LDL 58, A1c 7.6 discharged with DAPT and Crestor 20  Follow with Dr. Jannifer Franklin at [facility], 30-day cardiac event monitoring negative for A. fib.  History of TAVR 2020  Last echo 12/23/19 - LVEF 56%, Aortic valve replacement without regurgitation or leak.  Plavix on hold now due to hemorrhagic conversion  Resume once brain hemorrhage absorbed or near absorbed.  CAD w/Troponemia  Hx of RCA stent placement 12/2013  Troponin trend was flat.    Management per primary team   Plavix on hold   Hypertension  Home meds:  Demadex, Labetalol, Amlodipine,   Off nitroglycerin drip since last pm. Currently on Amlodipine,  Imdur, Labetolol, Hydralazine po and prn IV . BP goal is less than 0000000 systolic in the setting of hemorrhage . Long-term BP goal normotensive  CKD Stg III/IV  Cr of 2.65 today, Baseline appears to be 2.5-2.9.   Avoid nephro toxic medications  Management per primary team   Hyperlipidemia  Home meds:  Niaspan, Crestor '20mg'$    LDL 199, goal < 70  On Crestor '40mg'$  daily   Continue statin at discharge  Diabetes type II   Home meds: Lantus insulin  HgbA1c 7.7, goal < 7.0  CBGs  SSI while admitted   Management per primary team  Other Stroke Risk Factors  Advanced Age >/= 86   Hx stroke/TIA  Has history of obstructive sleep apnea, has not used on CPAP at home for several years. Likely needs sleep study.   CAD  Other Active Problems:  Thrombocytopenia,  platelet 92-> 120  RA   Hospital day # 1  I spent  35 minutes in total face-to-face time with the patient, more than 50% of which was spent in counseling and coordination of care, reviewing test results, images and medication, and discussing the diagnosis, treatment plan and potential prognosis. This patient's care requiresreview of multiple databases, neurological assessment, discussion with family, other specialists and medical decision making of high complexity.  Rosalin Hawking, MD PhD Stroke Neurology 02/12/2020 3:17 PM   To contact Stroke Continuity provider, please refer to [email or web address] After hours, contact General Neurology

## 2020-02-13 DIAGNOSIS — I161 Hypertensive emergency: Secondary | ICD-10-CM

## 2020-02-13 DIAGNOSIS — R531 Weakness: Secondary | ICD-10-CM

## 2020-02-13 DIAGNOSIS — I6389 Other cerebral infarction: Secondary | ICD-10-CM

## 2020-02-13 LAB — GLUCOSE, CAPILLARY
Glucose-Capillary: 149 mg/dL — ABNORMAL HIGH (ref 70–99)
Glucose-Capillary: 197 mg/dL — ABNORMAL HIGH (ref 70–99)
Glucose-Capillary: 105 mg/dL — ABNORMAL HIGH (ref 70–99)
Glucose-Capillary: 141 mg/dL — ABNORMAL HIGH (ref 70–99)
Glucose-Capillary: 147 mg/dL — ABNORMAL HIGH (ref 70–99)

## 2020-02-13 LAB — BASIC METABOLIC PANEL
Anion gap: 12 (ref 5–15)
BUN: 37 mg/dL — ABNORMAL HIGH (ref 8–23)
CO2: 19 mmol/L — ABNORMAL LOW (ref 22–32)
Calcium: 8.4 mg/dL — ABNORMAL LOW (ref 8.9–10.3)
Chloride: 103 mmol/L (ref 98–111)
Creatinine, Ser: 2.91 mg/dL — ABNORMAL HIGH (ref 0.44–1.00)
GFR, Estimated: 16 mL/min — ABNORMAL LOW (ref >60)
Glucose, Bld: 129 mg/dL — ABNORMAL HIGH (ref 70–99)
Potassium: 3.7 mmol/L (ref 3.5–5.1)
Sodium: 134 mmol/L — ABNORMAL LOW (ref 135–145)

## 2020-02-13 LAB — CBC
HCT: 32.4 % — ABNORMAL LOW (ref 36.0–46.0)
Hemoglobin: 10.9 g/dL — ABNORMAL LOW (ref 12.0–15.0)
MCH: 30.1 pg (ref 26.0–34.0)
MCHC: 33.6 g/dL (ref 30.0–36.0)
MCV: 89.5 fL (ref 80.0–100.0)
Platelets: 123 10*3/uL — ABNORMAL LOW (ref 150–400)
RBC: 3.62 MIL/uL — ABNORMAL LOW (ref 3.87–5.11)
RDW: 13.3 % (ref 11.5–15.5)
WBC: 8.2 10*3/uL (ref 4.0–10.5)
nRBC: 0 % (ref 0.0–0.2)

## 2020-02-13 NOTE — Plan of Care (Signed)

## 2020-02-13 NOTE — Research (Signed)
Patient is eligible for OPTIMISTmain trial after receiving tPA with low NIHSS and no specific ICU monitoring needed. Patient alert and oriented, pleasant in the bed.   Gave the Patient Information Sheet and reviewed patient's role in the study. Patient agreed to have data collected and analyzed, complete discharge questions, and have follow-up questions at 90-days.   All questions answered and information left at the bedside.

## 2020-02-13 NOTE — Progress Notes (Addendum)
STROKE TEAM PROGRESS NOTE   INTERVAL HISTORY No acute events since arrival.   Her daughter, Theadora Rama, is at the bedside.  Patient is sitting up alert and smiling drinking coffee held in her left hand. She reports she had a good night. She continues to deny headache, new numbness or tingling or weakness, difficulty speaking or thinking today. She states understanding of stroke diagnosis and imaging results from yesterday showing stable hemorrhagic conversion.  Neither patient or Theadora Rama have questions this morning.   Vitals:   02/13/20 0600 02/13/20 0700 02/13/20 0754 02/13/20 0800  BP: (!) 135/42 (!) 142/42  (!) 163/49  Pulse: (!) 55 (!) 54  (!) 55  Resp: 14 (!) 23  19  Temp:   98.9 F (37.2 C)   TempSrc:   Oral   SpO2: 100% 100%  100%  Weight:      Height:       CBC:  Recent Labs  Lab 02/12/20 0653 02/13/20 0125  WBC 7.0 8.2  HGB 11.4* 10.9*  HCT 34.2* 32.4*  MCV 89.3 89.5  PLT 120* [id number]   Basic Metabolic Panel:  Recent Labs  Lab 02/12/20 0653 02/13/20 0125  NA 136 134*  K 3.9 3.7  CL 103 103  CO2 20* 19*  GLUCOSE 128* 129*  BUN 33* 37*  CREATININE 2.65* 2.91*  CALCIUM 8.8* 8.4*   Lipid Panel:  Recent Labs  Lab 02/11/20 1625  CHOL 296*  TRIG 148  HDL 67  CHOLHDL 4.4  VLDL 30  LDLCALC 199*   HgbA1c:  Recent Labs  Lab 02/11/20 1600  HGBA1C 7.7*   Urine Drug Screen:  Recent Labs  Lab 02/08/20 1328  LABOPIA NONE DETECTED  COCAINSCRNUR NONE DETECTED  LABBENZ NONE DETECTED  AMPHETMU NONE DETECTED  THCU NONE DETECTED  LABBARB NONE DETECTED    Alcohol Level No results for input(s): ETH in the last 168 hours.  IMAGING past 24 hours CT HEAD WO CONTRAST  Result Date: 02/12/2020 CLINICAL DATA:  Follow-up cerebral hemorrhage. EXAM: CT HEAD WITHOUT CONTRAST TECHNIQUE: Contiguous axial images were obtained from the base of the skull through the vertex without intravenous contrast. COMPARISON:  Head MRI 02/12/2020 FINDINGS: Brain: Small adjacent  parenchymal hemorrhages in the paramedian posterior right frontal lobe are unchanged from today's MRI with the larger, more posteriorly located hemorrhage measuring 1.6 cm. There is persistent mild edema without mass effect. Subcentimeter acute or early subacute infarcts in the posterior right frontal lobe on MRI are not well demonstrated by CT. Chronic infarcts are again noted in the right frontal and right occipital lobes. Hypodensities in the cerebral white matter bilaterally are nonspecific but compatible with mild chronic small vessel ischemic disease. Mild generalized cerebral atrophy is within normal limits for age. No midline shift or extra-axial fluid collection is identified. Vascular: Calcified atherosclerosis at the skull base. Right MCA stent. Skull: No fracture or suspicious osseous lesion. Sinuses/Orbits: Minimal posterior left ethmoid air cell mucosal thickening. Clear mastoid air cells. Bilateral cataract extraction. Other: None. IMPRESSION: 1. Unchanged small paramedian right frontal lobe parenchymal hemorrhages with mild edema. No mass effect. 2. Chronic ischemia including old right frontal and occipital infarcts. Electronically Signed   By: Logan Bores [name]   On: 02/12/2020 21:30    PHYSICAL EXAM General: Sitting up in bed sipping coffee in NAD. Conversant, calm and pleasant.  Neuro:   Mental Status: Alert.  She is oriented to person, place, month, year, age, and situation.  She follows two step commands without difficulty.  Speech is clear without aphasia or dysarthria. No signs of neglect seen. Cranial Nerves: II: Visual Fields are full except left upper quadrantanopia. Pupils are equal, round, and reactive to light 3 mm/brisk. III,IV, VI: EOMI without ptosis or diploplia.   V: Facial sensation is symmetric to light touch V1-V3 VII: Facial movement is symmetric resting and smiling VIII: Hearing is intact to voice X: Palate elevates symmetrically, phonation intact XI: Shoulder  shrug is symmetric. XII: Tongue protrudes midline Motor: Tone is normal. Bulk is normal. Strength is 5/5 in bilat UE. No drift. Bilat LE strength is 5/5 RLE, there is drift in the LLE and mild proximal weakness 4/5. She is moving both legs spontaneously and purposefully.  Sensory:  Sensation equal and symmetric to bilateral upper extremities and lower extremities.  Plantars: Toes are downgoing bilaterally.  Cerebellar: FNF and HKS are intact bilaterally. Gait: Deferred  ASSESSMENT/PLAN Brief summary of Dr. Lyn Records H&P: GENNI CARLOCK is a 80 y.o. female with a history significant for hypertension, hyperlipidemia, type 2 diabetes mellitus, chronic kidney disease stage III/IV, acute on chronic diastolic heart failure, history of stroke (no residual left-sided weakness, right MCA s/p stent in 2015 with subsequent occlusion, right PCA CVA August 2020, multiple TIAs), CRVO 2020, coronary artery disease, transaortic valve replacement for severe aortic stenosis November 2020. She was admitted to [facility] on 02/08/20 for severe hypertension requiring nitroglycerin infusion and transient/varying degrees of left arm tingling, numbness, weakness, and left lower extremity. MRI neg for stroke. AT 06:45 1/25 the patient experienced sudden onset left lower extremity numbness and left lower extremity and left arm weakness prompting a stroke alert activation.  Her symptoms waxed and waned initially.  Head CT did not show any acute intracranial abnormality, however on gait evaluation she required 2 person assist to ambulate a few steps and was quite unsteady. Given her risk factors MRI brain was obtained to further clarify.  As this revealed an acute infarct in a location that explain her symptoms well, risks and benefits of tPA were discussed with the patient and decision was made to proceed with tPA.  Despite her low total NIH stroke scale, given the significant impairment of her ambulation this was felt to be a disabling  symptom.  Stroke: 2 punctate infarcts in the right ACA cortex s/p tPA, concerning for  cardioembolic etiology.  Hemorrhagic conversion - 2 two new foci of acute hemorrhagic conversion within the paramedian mid to posterior right frontal lobe  CT head 1/22 and 1/25: No acute abnormality. Remote right frontal and right occipital infarcts with encephalomalacia.   MRI 1/22 - no acute infarct, remote right cerebral infarcts  MRI 1/25 Interval development of 5 mm area of acute infarct in the right frontal parietal cortex over the convexity. Chronic infarct right frontal and right occipital lobe unchanged.   MRA 1/25 demonstrates right M1 stent with loss of flow in the right middle cerebral artery unchanged from 2020 MRA. Possible occlusion versus artifact. Bilateral PCA stenosis. No change from 2020  MRI 1/26 Interval development of two foci of acute parenchymal hemorrhage within the paramedian mid to posterior right frontal lobe, the largest measuring 1.2 cm with mild surrounding vasogenic edema.  2D Echo 12/23/2019 EF 56%.Left atrial cavity is mildly dilated. Well seated Wende Crease Sapien 3 bioprosthetic aortic valve with normal functioning.  LDL 199  HgbA1c 7.7  VTE prophylaxis: SCDs for now  Recommend loop recorder to rule out A. fib. EP team contacted for consideration of Loop placement.  Patient was on Plavix monotherapy prior to admission, now HOLD all antiplatelets given hemorrhagic conversion.  Recommend to restart ASA 81 next Monday 1/31 if neuro stable and CT repeat showed stable hemorrhage.  Therapy recommendations:  CIR  Disposition:  pending  History of stroke  Remote right frontal stroke status post right M1 stent, however documented stent occlusion later  08/2018 admitted for right PCA infarct with left upper quadrantanopsia.  CT showed acute/subacute right PCA infarct, old right MCA infarct with right M1 stent.  MRI right occipital infarct.  MRA right M1 stent, left  P3 occlusion.  Carotid Doppler negative.  EF 60 to 65%.  DVT negative.  LDL 58, A1c 7.6 discharged with DAPT and Crestor 20  Follow with [facility]. Jannifer Franklin at [facility], 30-day cardiac event monitoring negative for A. fib.  History of TAVR 2020  Last echo 12/23/19 - LVEF 56%, Aortic valve replacement without regurgitation or leak.  Plavix on hold now due to hemorrhagic conversion  Recommend to restart ASA 81 next Monday 1/31 if neuro stable and CT repeat showed stable hemorrhage.  CAD w/Troponemia  Hx of RCA stent placement 12/2013  Troponin trend was flat.    Management per primary team   Plavix on hold   Hypertension  Home meds:  Demadex, Labetalol, Amlodipine,   Off nitroglycerin drip since last pm. Currently on Amlodipine, Imdur, Labetolol, Hydralazine po and prn IV . BP goal is less than 0000000 systolic in the setting of hemorrhage . Long-term BP goal normotensive  CKD Stg III/IV  Cr of 2.49->2.65->2.91, Baseline appears to be 2.5-2.9.   Avoid nephro toxic medications  Management per primary team   Hyperlipidemia  Home meds:  Niaspan, Crestor '20mg'$    LDL 199, goal < 70  On Crestor '40mg'$  daily   Continue statin at discharge  Diabetes type II   Home meds: Lantus insulin  HgbA1c 7.7, goal < 7.0  CBGs  SSI while admitted   Management per primary team  Other Stroke Risk Factors  Advanced Age >/= 52   Hx stroke/TIA  Has history of obstructive sleep apnea, has not used on CPAP at home for several years. Likely needs sleep study.   CAD  Other Active Problems:  Thrombocytopenia, platelet 92-> 120  RA  Neurology will sign off. Please call with questions. Pt will follow up with Dr. Jannifer Franklin at [facility] in about 4 weeks. Thanks for the consult.  Rosalin Hawking, MD PhD Stroke Neurology 02/13/2020 5:32 PM    To contact Stroke Continuity provider, please refer to [email or web address] After hours, contact General Neurology

## 2020-02-13 NOTE — Progress Notes (Signed)
Subjective:   No acute events overnight.   Mild headache overnight , which improved with tylenol. No headache or new neurological changes this morning. Patients daughter is bedside. We reviewed understanding of diagnosis with daughter and patient. Patient's daughter has a good understanding of stroke and stable hemorrhagic conversion. Patient daughter had questions on blood thinners at discharge. We discussed different medications referred to as blood thinners. We discussed what atrial fibrillation is and prevalence of atrial fibrillation. Patients daughter is concerned she is very compliment with medications and eats healthy but has had this acute event. Daughter was reassured we are working with the help of our neurology team  to minimize modifiable factors.   Objective:  Vital signs in last 24 hours: Vitals:   02/13/20 0200 02/13/20 0300 02/13/20 0400 02/13/20 0500  BP: (!) 132/33 (!) 148/49 (!) 137/41 (!) 157/45  Pulse: (!) 53 (!) 55 (!) 55 (!) 58  Resp: 18 17 (!) 0 (!) 0  Temp:      TempSrc:      SpO2: 100% 100% 100% 100%  Weight:      Height:       Supplemental O2: Room Air SpO2: 100 %   Physical Exam:   Constitutional: well-appearing resting in bed comforbtly HENT: normocephalic atraumatic. Right pupil ERRL. Left pupil abnormal pupil.  Cardiovascular: Systolic murmur appreciated, best heard over second right intercostal space. Normal rate and rhythm Pulmonary/Chest: normal work of breathing on room air, lungs clear to auscultation bilaterally Neurological: alert & oriented x 3.  Normal extremity sensation. Weakness of left lower extremity 4/5.  Upper extremities without weakness.  Skin: warm and dry  Filed Weights   02/08/20 0611 02/11/20 0415 02/12/20 0400  Weight: 73.9 kg 70.9 kg 67.3 kg    Intake/Output Summary (Last 24 hours) at 02/13/2020 0610 Last data filed at 02/12/2020 1855 Gross per 24 hour  Intake 240 ml  Output 1450 ml  Net -1210 ml   Net IO Since  Admission: -3,178.89 mL [02/13/20 0610]  Pertinent Labs: CBC Latest Ref Rng & Units 02/13/2020 02/12/2020 02/11/2020  WBC 4.0 - 10.5 K/uL 8.2 7.0 8.1  Hemoglobin 12.0 - 15.0 g/dL 10.9(L) 11.4(L) 11.3(L)  Hematocrit 36.0 - 46.0 % 32.4(L) 34.2(L) 36.2  Platelets 150 - 400 K/uL 123(L) 120(L) 109(L)    CMP Latest Ref Rng & Units 02/13/2020 02/12/2020 02/11/2020  Glucose 70 - 99 mg/dL 129(H) 128(H) 145(H)  BUN 8 - 23 mg/dL 37(H) 33(H) 27(H)  Creatinine 0.44 - 1.00 mg/dL 2.91(H) 2.65(H) 2.49(H)  Sodium 135 - 145 mmol/L 134(L) 136 139  Potassium 3.5 - 5.1 mmol/L 3.7 3.9 4.7  Chloride 98 - 111 mmol/L 103 103 107  CO2 22 - 32 mmol/L 19(L) 20(L) 21(L)  Calcium 8.9 - 10.3 mg/dL 8.4(L) 8.8(L) 9.1  Total Protein 6.5 - 8.1 g/dL - - -  Total Bilirubin 0.3 - 1.2 mg/dL - - -  Alkaline Phos 38 - 126 U/L - - -  AST 15 - 41 U/L - - -  ALT 0 - 44 U/L - - -    Imaging: CT HEAD WO CONTRAST  Result Date: 02/12/2020 CLINICAL DATA:  Follow-up cerebral hemorrhage. EXAM: CT HEAD WITHOUT CONTRAST TECHNIQUE: Contiguous axial images were obtained from the base of the skull through the vertex without intravenous contrast. COMPARISON:  Head MRI 02/12/2020 FINDINGS: Brain: Small adjacent parenchymal hemorrhages in the paramedian posterior right frontal lobe are unchanged from today's MRI with the larger, more posteriorly located hemorrhage measuring 1.6 cm.  There is persistent mild edema without mass effect. Subcentimeter acute or early subacute infarcts in the posterior right frontal lobe on MRI are not well demonstrated by CT. Chronic infarcts are again noted in the right frontal and right occipital lobes. Hypodensities in the cerebral white matter bilaterally are nonspecific but compatible with mild chronic small vessel ischemic disease. Mild generalized cerebral atrophy is within normal limits for age. No midline shift or extra-axial fluid collection is identified. Vascular: Calcified atherosclerosis at the skull base.  Right MCA stent. Skull: No fracture or suspicious osseous lesion. Sinuses/Orbits: Minimal posterior left ethmoid air cell mucosal thickening. Clear mastoid air cells. Bilateral cataract extraction. Other: None. IMPRESSION: 1. Unchanged small paramedian right frontal lobe parenchymal hemorrhages with mild edema. No mass effect. 2. Chronic ischemia including old right frontal and occipital infarcts. Electronically Signed   By: Logan Bores [name]   On: 02/12/2020 21:30   MR BRAIN WO CONTRAST  Result Date: 02/12/2020 CLINICAL DATA:  Stroke, follow-up. EXAM: MRI HEAD WITHOUT CONTRAST TECHNIQUE: Multiplanar, multiecho pulse sequences of the brain and surrounding structures were obtained without intravenous contrast. COMPARISON:  MRI/MRA head 02/11/2020, noncontrast head CT 02/11/2020, brain MRI 02/08/2020. FINDINGS: Brain: Mild intermittent motion degradation. Mild cerebral and cerebellar atrophy. Two unchanged 4 mm foci of restricted diffusion within the posterior right frontal lobe compatible with acute/early subacute infarction. One of these infarcts involve the motor strip. Since the prior MRI of 02/11/2020, there has been interval development of two foci of acute parenchymal hemorrhage within the paramedian mid and posterior right frontal lobe. The more posterior of the parenchymal hemorrhages measures 1.2 cm and has mild surrounding vasogenic edema. The focus of parenchymal hemorrhage more anteriorly measures 5 mm. Redemonstrated chronic cortically based infarcts within the posterior right frontal lobe and right occipital lobe. Mild multifocal T2/FLAIR hyperintensity elsewhere within the cerebral white matter is nonspecific, but compatible with chronic small vessel ischemic disease. Small chronic infarct within the left cerebellum. As before, there is mild hemosiderin deposition associated with the chronic right occipital lobe infarct. Redemonstrated right periatrial and right occipital lobe chronic  microhemorrhages. No extra-axial fluid collection. No midline shift. Partially empty sella turcica. Vascular: No appreciable change. Septi artifact arising from a stent within the M1 right middle cerebral artery. Skull and upper cervical spine: No focal marrow lesion. Sinuses/Orbits: Bilateral lens replacements. Visualized orbits show no acute finding. Mild ethmoid sinus mucosal thickening. Small right maxillary sinus mucous retention cyst. These results were called by telephone at the time of interpretation on 02/12/2020 at 9:44 am to provider Dr. Erlinda Hong, who verbally acknowledged these results. IMPRESSION: Interval development of two foci of acute parenchymal hemorrhage within the paramedian mid to posterior right frontal lobe, the largest measuring 1.2 cm with mild surrounding vasogenic edema. Two unchanged 4 mm acute/early subacute infarcts within the posterior right frontal lobe, one involving the motor strip. Otherwise stable non-contrast MRI appearance of the brain. Chronic cortically based infarcts within the right frontal and occipital lobes. Background mild generalized atrophy of the brain and chronic small vessel ischemic disease. Small chronic left cerebellar infarct. Electronically Signed   By: Kellie Simmering DO   On: 02/12/2020 09:45    Assessment/Plan:   Active Problems:   Weakness   Acute left-sided weakness   Thrombocytopenia ([place])   Cerebral embolism with cerebral infarction   Patient Summary: [name] is a 80 y.o. with pertinent PMH of HTN, HLD, T2DM, CKD Stg III/IV, h/o of CVA R. MCA 2015 s/p stent with subsequent  occlusion, CVA R. PCA 08/2018, multiple TIA's, CAD RCA stent placement, TAVR for severe AS 11/2018 who presented with weakness and admit for further neurological workup  Weakness Likely 2/2 to Elevated Blood Pressure Acute Punctate Infarct right ACA Cortex s/p TPA Hemorrhagic Conversion Paramedian mid to Posterior Right Frontal Lobe  Hx of CVA and TIA 1/22 MRI Brain w/o  C - No acute infarct/hemorrhage 1/25 patient with increased weakness, repeat MRI - acute punctate infarct. Given tPA 1/26 MRI revealed hemorrhagic conversion, repeat CT at 1800 unchanged small paramedian right frontal lobe hemorrhage.  Patient with left lower extremity weakness, with some improvement to gravity, but no improvement of LLE strength to resistance. She denies any new neurological symptoms today. Holding all anti-coagulation in setting of hemorrhagic conversion, repeat CT yesterday evening showed no change in hemorrhagic area which is reassuring. Neuro following, recommending loop recorder to assess for arrhythmia as possible etiology. Also neuro to determine repeat imaging to reassess hemorrhagic area. Plan to resume anticoagulation once hemorrhage absorbed or near absorbed. Patient will need close follow up once discharged to manage risk factors in setting of hyperlipidemia despite adherence to prior statin therapy. -strict BP parameters - <180/105 -frequent neuro checks - STAT Head CT if positive -no echo, patient had one earlier December -hold all anticoagulation -PT/OT CIR -continue home medications amlodipine,torsemide, held labetalol today in setting of bradycardia -continue imdur -continue crestor 20 mg daily, elevated LDL and total cholesterol. Endorses compliance, but notes she was out of the meds for a "few" days prior to admission.  -loop recorder per EP  Thrombocytopenia On admission, patient with plt 90's. Appears new since labs 1 yr ago. Uncertain of etiology at this point. Blood smear with evenidence of thrombocytopenia. Platelets improving at 123 today. -ctm -daily cbc  Hx of TAVR 11/2018 HFpEF Last echo 12/23/19 - LVEF 56%, Aortic valve replacement without regurgitation or leak. Mild mitral and tricuspid regurgitation. Do not believe contributing to wide pulse pressures, appears to be chronic.   CKD Stg III/IV Cr of 2.91 Baseline appears to be 2.5-2.9. Slight  increase may be due to resolution of hypertension -will continue to monitor -avoid nephro toxic medications  CAD Troponemia  Hx of RCA stent placement 12/2013 Troponin trend was flat.   -cardiac monitoring -Hold plavix until hemorrhagic region reabsorbed or near reabsorbation  Hx T2DM A1C Pending. Last A1c 11/09/18 - 7.6 - SSI during her admission  Anemia of Chronic Disease:  Patient remains normocytic, hgb stable.  -Daily cbc  Diet: Regular Diet VTE: Plavix IVF: None,None Code: Full  Dispo: In setting acute CVA, changed to inpatient  Sanjuana Letters, DO Internal Medicine Resident PGY-1 Pager [phone] Please contact the on call pager after 5 pm and on weekends at [phone].

## 2020-02-13 NOTE — Progress Notes (Signed)
Physical Therapy Treatment Patient Details Name: [name] MRN: [id number] DOB: 01/31/1940 Today's Date: 02/13/2020    History of Present Illness Pt is an 80 y/o female admitted secondary to L sided weakness. Also found to be hypertensive. MRI negative. PMH includes CAD, DM, CVA, TAVR, and CKD.    PT Comments    Pt fully participated in session. Pt demonstrating deficits in strength and coordination with LLE. Pt with balance deficits noted in sitting and standing. Pt was I prior to admission. Pt will benefit from skilled PT to address deficits in balance, strength, coordination, gait and endurance to maximize independence with functional mobility prior to discharge.   Follow Up Recommendations  CIR     Equipment Recommendations  Rolling walker with 5" wheels    Recommendations for Other Services       Precautions / Restrictions Precautions Precautions: Fall Restrictions Weight Bearing Restrictions: No    Mobility  Bed Mobility Overal bed mobility: Needs Assistance Bed Mobility: Supine to Sit     Supine to sit: Min assist        Transfers Overall transfer level: Needs assistance Equipment used: 1 person hand held assist Transfers: Sit to/from Stand;Stand Pivot Transfers Sit to Stand: Min assist Stand pivot transfers: Min assist       General transfer comment: pt with retropulsion with standing, performed multiple trials with verbal cueing to increase forward weight shift with improved balance/alignment with continued trials  Ambulation/Gait                 Stairs             Wheelchair Mobility    Modified Rankin (Stroke Patients Only) Modified Rankin (Stroke Patients Only) Pre-Morbid Rankin Score: No symptoms Modified Rankin: Moderately severe disability     Balance Overall balance assessment: Needs assistance Sitting-balance support: Feet supported;Single extremity supported Sitting balance-Leahy Scale: Poor Sitting balance -  Comments: lateral lean L   Standing balance support: During functional activity;Bilateral upper extremity supported Standing balance-Leahy Scale: Poor                              Cognition Arousal/Alertness: Awake/alert Behavior During Therapy: WFL for tasks assessed/performed                                 Problem Solving: Slow processing        Exercises General Exercises - Lower Extremity Ankle Circles/Pumps: AROM;Both;10 reps;Supine Quad Sets: AROM;Both;10 reps;Supine Gluteal Sets: AROM;Both;10 reps;Supine Long Arc Quad: AROM;Both;10 reps;Seated Heel Slides: AROM;Both;10 reps;Supine Hip ABduction/ADduction: AROM;Both;10 reps;Supine Straight Leg Raises: AROM;Both;10 reps;Supine Hip Flexion/Marching: AROM;Both;10 reps;Seated    General Comments        Pertinent Vitals/Pain Pain Assessment: No/denies pain    Home Living                      Prior Function            PT Goals (current goals can now be found in the care plan section) Acute Rehab PT Goals Patient Stated Goal: To return home PT Goal Formulation: With patient Time For Goal Achievement: 02/24/20 Potential to Achieve Goals: Good Progress towards PT goals: Progressing toward goals    Frequency    Min 4X/week      PT Plan Current plan remains appropriate    Co-evaluation  AM-PAC PT "6 Clicks" Mobility   Outcome Measure  Help needed turning from your back to your side while in a flat bed without using bedrails?: A Little Help needed moving from lying on your back to sitting on the side of a flat bed without using bedrails?: A Little Help needed moving to and from a bed to a chair (including a wheelchair)?: A Little Help needed standing up from a chair using your arms (e.g., wheelchair or bedside chair)?: A Little Help needed to walk in hospital room?: A Lot Help needed climbing 3-5 steps with a railing? : A Lot 6 Click Score: 16     End of Session Equipment Utilized During Treatment: Gait belt Activity Tolerance: Patient tolerated treatment well Patient left: in chair;with call bell/phone within reach;with family/visitor present Nurse Communication: Mobility status PT Visit Diagnosis: Unsteadiness on feet (R26.81);Muscle weakness (generalized) (M62.81);Difficulty in walking, not elsewhere classified (R26.2)     Time: [id number] PT Time Calculation (min) (ACUTE ONLY): 21 min  Charges:  $Therapeutic Exercise: 8-22 mins                     Lyanne Co, DPT Acute Rehabilitation Services [id number]   [place] 02/13/2020, 1:00 PM

## 2020-02-13 NOTE — Progress Notes (Signed)
Inpatient Rehab Admissions:  Inpatient Rehab Consult received.  Attempted to contact pt to discuss CIR (working remotely).  No answer.  Will f/u tomorrow.   Signed: Urban Gibson

## 2020-02-14 ENCOUNTER — Inpatient Hospital Stay (HOSPITAL_COMMUNITY): Payer: Medicare PPO

## 2020-02-14 LAB — BASIC METABOLIC PANEL
Anion gap: 12 (ref 5–15)
BUN: 41 mg/dL — ABNORMAL HIGH (ref 8–23)
CO2: 22 mmol/L (ref 22–32)
Calcium: 8.8 mg/dL — ABNORMAL LOW (ref 8.9–10.3)
Chloride: 102 mmol/L (ref 98–111)
Creatinine, Ser: 2.85 mg/dL — ABNORMAL HIGH (ref 0.44–1.00)
GFR, Estimated: 16 mL/min — ABNORMAL LOW (ref >60)
Glucose, Bld: 133 mg/dL — ABNORMAL HIGH (ref 70–99)
Potassium: 3.7 mmol/L (ref 3.5–5.1)
Sodium: 136 mmol/L (ref 135–145)

## 2020-02-14 LAB — CBC
HCT: 33 % — ABNORMAL LOW (ref 36.0–46.0)
Hemoglobin: 11.1 g/dL — ABNORMAL LOW (ref 12.0–15.0)
MCH: 29.9 pg (ref 26.0–34.0)
MCHC: 33.6 g/dL (ref 30.0–36.0)
MCV: 88.9 fL (ref 80.0–100.0)
Platelets: 163 10*3/uL (ref 150–400)
RBC: 3.71 MIL/uL — ABNORMAL LOW (ref 3.87–5.11)
RDW: 13.4 % (ref 11.5–15.5)
WBC: 8.1 10*3/uL (ref 4.0–10.5)
nRBC: 0 % (ref 0.0–0.2)

## 2020-02-14 LAB — GLUCOSE, CAPILLARY
Glucose-Capillary: 148 mg/dL — ABNORMAL HIGH (ref 70–99)
Glucose-Capillary: 188 mg/dL — ABNORMAL HIGH (ref 70–99)
Glucose-Capillary: 113 mg/dL — ABNORMAL HIGH (ref 70–99)
Glucose-Capillary: 203 mg/dL — ABNORMAL HIGH (ref 70–99)

## 2020-02-14 MED ORDER — POLYETHYLENE GLYCOL 3350 17 G PO PACK
17.0000 g | PACK | Freq: Every day | ORAL | Status: DC
  Administered 2020-02-14 – 2020-02-16 (×3): 17 g via ORAL
  Filled 2020-02-14 (×4): qty 1

## 2020-02-14 MED ORDER — SENNOSIDES-DOCUSATE SODIUM 8.6-50 MG PO TABS
1.0000 | ORAL_TABLET | Freq: Two times a day (BID) | ORAL | Status: DC
  Administered 2020-02-14 (×2): 1 via ORAL
  Filled 2020-02-14 (×2): qty 1

## 2020-02-14 NOTE — PMR Pre-admission (Addendum)
PMR Admission Coordinator Pre-Admission Assessment  Patient: [name] is an 80 y.o., female MRN: [id number] DOB: [birth year] Height: 5\' 3"  (160 cm) Weight: 67.3 kg              Insurance Information HMO:     PPO: yes     PCP:      IPA:      80/20:      OTHER:  PRIMARY: [employer]      Policy#: [id number]      Subscriber: pt CM Name: Lattie Haw      Phone#: [phone] ext [phone]     Fax#: [phone] Pre-Cert#: [id number] auth for CIR provided by Lattie Haw at [facility] with updates due to Lestine Box at fax listed above on 2/7 (phone [phone] ext [phone])      Employer: n/a Benefits:  Phone #: [phone]     Name:  Eff. Date: 01/18/20     Deduct: $0      Out of Pocket Max: $4000 ($0 met)      Life Max: n/a  CIR: $160/day for days 1-10      SNF: 20 full days Outpatient:      Co-Pay: $20/visit Home Health: 100%      Co-Pay:  DME: 80%     Co-Ins: 20% Providers: preferred network  SECONDARY:       Policy#:       Phone#:   Development worker, community:       Phone#:   The Engineer, [employer]" for patients in Inpatient Rehabilitation Facilities with attached "Privacy Act [place] Records" was provided and verbally reviewed with: Patient and Family  Emergency Contact Information Contact Information    Name Relation Home Work [place] Other   [phone]     Current Medical History  Patient Admitting Diagnosis: CVA   History of Present Illness: [name] is a 80 y.o. right-handed female with history of diabetes mellitus, CKD followed by Dr. Hollie Salk, hyperlipidemia, history of CVA x2 without residual weakness including right MCA stenting, obesity with BMI 26.28, CAD with TVAR procedure.  Per chart review patient lives alone independent prior to admission.  Presented 02/08/2020 with headache transient left arm weakness and numbness.  Cranial CT scan negative for acute changes.  Patient did not receive TPA.  MRI showed interval development of 2  foci of acute parenchymal hemorrhage within the paramedian mid to posterior right frontal lobe largest measuring 1.2 cm with mild surrounding vasogenic edema.  2 unchanged 4 mm acute early subacute infarct within the posterior right frontal lobe.  Admission chemistries BUN 27, creatinine 2.49, hemoglobin A1c 7.7.  Neurology follow-up close monitoring of blood pressure and awaiting plan for loop recorder placement and awaiting plan for loop recorder placement.  Therapy evaluations completed with recommendations of CIR.   Complete NIHSS TOTAL: 0 Glasgow Coma Scale Score: 15  Past Medical History  Past Medical History:  Diagnosis Date  . Arthritis   . Cerebrovascular disease   . CKD (chronic kidney disease)    Sees Dr Hollie Salk  . Coronary artery disease   . Depression   . Diabetes ([place])    INSULIN DEPENDENT  . Diabetic peripheral neuropathy ([place])   . Diabetic retinopathy ([place])   . Diverticulitis   . GERD (gastroesophageal reflux disease)   . History of CVA (cerebrovascular accident)    x 2 no residulal  . Hyperlipidemia   . Hypertension   . Obesity   . Renal lesion   .  S/P TAVR (transcatheter aortic valve replacement) 11/13/2018   s/p TAVR with a 37mm Edwards S3U via the TF approach by Dr. [name] and Dr. Angelena Form  . Severe aortic stenosis     Family History  family history includes Diabetes in her mother; Heart disease in her mother; Hypertension in her brother; Prostate cancer in her brother and father.  Prior Rehab/Hospitalizations:  Has the patient had prior rehab or hospitalizations prior to admission? No  Has the patient had major surgery during 100 days prior to admission? No  Current Medications   Current Facility-Administered Medications:  .  0.9 %  sodium chloride infusion, , Intravenous, Continuous, Bhagat, Srishti L, MD, Stopped at 02/11/20 1121 .  acetaminophen (TYLENOL) tablet 650 mg, 650 mg, Oral, Q6H PRN, 650 mg at 02/12/20 1919 **OR** acetaminophen (TYLENOL)  suppository 650 mg, 650 mg, Rectal, Q6H PRN, Maudie Mercury, MD .  amLODipine (NORVASC) tablet 10 mg, 10 mg, Oral, Daily, Katsadouros, Vasilios, MD, 10 mg at 02/14/20 6811 .  Chlorhexidine Gluconate Cloth 2 % PADS 6 each, 6 each, Topical, Daily, Lucious Groves, DO, 6 each at 02/13/20 1600 .  hydrALAZINE (APRESOLINE) injection 5 mg, 5 mg, Intravenous, Q4H PRN, Bailey-Modzik, Delila A, NP .  hydrALAZINE (APRESOLINE) tablet 50 mg, 50 mg, Oral, Q8H, Hoffman, Erik C, DO, 50 mg at 02/14/20 0603 .  insulin aspart (novoLOG) injection 0-15 Units, 0-15 Units, Subcutaneous, TID WC, Maudie Mercury, MD, 2 Units at 02/14/20 [phone] .  insulin glargine (LANTUS) injection 10 Units, 10 Units, Subcutaneous, QHS, Maudie Mercury, MD, 10 Units at 02/13/20 2103 .  isosorbide mononitrate (IMDUR) 24 hr tablet 30 mg, 30 mg, Oral, Daily, Hoffman, Erik C, DO, 30 mg at 02/14/20 2035 .  labetalol (NORMODYNE) tablet 200 mg, 200 mg, Oral, BID, Katsadouros, Vasilios, MD, 200 mg at 02/14/20 0832 .  LORazepam (ATIVAN) injection 0.5 mg, 0.5 mg, Intravenous, Once, Bhagat, Srishti L, MD .  polyethylene glycol (MIRALAX / GLYCOLAX) packet 17 g, 17 g, Oral, Daily, Alexandria Lodge, MD, 17 g at 02/14/20 1014 .  rosuvastatin (CRESTOR) tablet 40 mg, 40 mg, Oral, Daily, Rosalin Hawking, MD, 40 mg at 02/14/20 5974 .  senna-docusate (Senokot-S) tablet 1 tablet, 1 tablet, Oral, BID, Alexandria Lodge, MD, 1 tablet at 02/14/20 1014 .  torsemide (DEMADEX) tablet 40 mg, 40 mg, Oral, Daily, Lucious Groves, DO, 40 mg at 02/14/20 1638 .  venlafaxine XR (EFFEXOR-XR) 24 hr capsule 150 mg, 150 mg, Oral, Daily, Lucious Groves, DO, 150 mg at 02/14/20 4536  Patients Current Diet:  Diet Order            Diet renal/carb modified with fluid restriction Diet-HS Snack? Nothing; Fluid restriction: 1200 mL Fluid; Room service appropriate? Yes; Fluid consistency: Thin  Diet effective now                 Precautions / Restrictions Precautions Precautions:  Fall Restrictions Weight Bearing Restrictions: No   Has the patient had 2 or more falls or a fall with injury in the past year?No  Prior Activity Level Community (5-7x/wk): full independent prior to admission  Prior Functional Level Prior Function Level of Independence: Independent  Self Care: Did the patient need help bathing, dressing, using the toilet or eating?  Independent  Indoor Mobility: Did the patient need assistance with walking from room to room (with or without device)? Independent  Stairs: Did the patient need assistance with internal or external stairs (with or without device)? Independent  Functional Cognition: Did the patient  need help planning regular tasks such as shopping or remembering to take medications? Independent  Home Assistive Devices / Equipment Home Assistive Devices/Equipment: None Home Equipment: Shower seat,Cane - single point  Prior Device Use: Indicate devices/aids used by the patient prior to current illness, exacerbation or injury? None of the above  Current Functional Level Cognition  Overall Cognitive Status: Impaired/Different from baseline Orientation Level: Oriented X4 General Comments: pt received ativan earlier for scheduled MRI    Extremity Assessment (includes Sensation/Coordination)  Upper Extremity Assessment: Generalized weakness  Lower Extremity Assessment: Defer to PT evaluation    ADLs  Overall ADL's : Needs assistance/impaired Eating/Feeding: Set up,Sitting Grooming: Set up,Sitting Upper Body Dressing : Minimal assistance,Sitting Lower Body Dressing: Moderate assistance,Sitting/lateral leans Functional mobility during ADLs: Minimal assistance,Rolling walker General ADL Comments: multiple LOB backwards    Mobility  Overal bed mobility: Needs Assistance Bed Mobility: Supine to Sit Supine to sit: Supervision Sit to supine: Supervision General bed mobility comments: pt up in chair upon PT arrival    Transfers   Overall transfer level: Needs assistance Equipment used: Rolling walker (2 wheeled) Transfers: Sit to/from [employer] Sit to Stand: Min guard Stand pivot transfers: Min guard General transfer comment: better balance noted, continues with difficulty L leg weakness    Ambulation / Gait / Stairs / Wheelchair Mobility  Ambulation/Gait Ambulation/Gait assistance: Counsellor (Feet): 5 Feet Assistive device: 1 person hand held assist Gait Pattern/deviations: Step-through pattern,Decreased stride length General Gait Details: attempted to march in place in RW however pt with strong L lateral lean, with tactile and verbal cues able to correct but then became nauseated and was very sleepy Gait velocity: Decreased    Posture / Balance Dynamic Sitting Balance Sitting balance - Comments: lateral lean L Balance Overall balance assessment: Needs assistance Sitting-balance support: Feet supported Sitting balance-Leahy Scale: Fair Sitting balance - Comments: lateral lean L Standing balance support: During functional activity,Bilateral upper extremity supported Standing balance-Leahy Scale: Poor Standing balance comment: needs external support    Special needs/care consideration Diabetic management yes     Previous Home Environment (from acute therapy documentation) Living Arrangements: Alone Available Help at Discharge: Family,Available 24 hours/day Type of Home: House Home Layout: Two level Alternate Level Stairs-Rails: Left,Right Alternate Level Stairs-Number of Steps: flight Home Access: Stairs to enter Entrance Stairs-Rails: None Technical brewer of Steps: 2 Bathroom Shower/Tub: Multimedia programmer: Standard Home Care Services: No  Discharge Living Setting Plans for Discharge Living Setting: Lives with (comment),Other (Comment) (brother) Type of Home at Discharge: House Discharge Home Layout: Two level Alternate Level Stairs-Rails:  Left,Right Alternate Level Stairs-Number of Steps: flight Discharge Home Access: Stairs to enter Entrance Stairs-Rails: None Entrance Stairs-Number of Steps: 2 Discharge Bathroom Shower/Tub: Walk-in shower Discharge Bathroom Toilet: Standard Discharge Bathroom Accessibility: Yes How Accessible: Accessible via walker Does the patient have any problems obtaining your medications?: No  Social/Family/Support Systems Patient Roles: Parent Contact Information: 2 adult children (youngest is in college) Anticipated Caregiver: brother, Lorre Nick Anticipated Caregiver's Contact Information: [phone] Ability/Limitations of Caregiver: none Caregiver Availability: 24/7 Discharge Plan Discussed with Primary Caregiver: Yes Is Caregiver In Agreement with Plan?: Yes Does Caregiver/Family have Issues with Lodging/Transportation while Pt is in Rehab?: No   Goals Patient/Family Goal for Rehab: PT/OT/SLP supervision to mod I Expected length of stay: 14-16 days Pt/Family Agrees to Admission and willing to participate: Yes Program Orientation Provided & Reviewed with Pt/Caregiver Including Roles  & Responsibilities: Yes  Barriers to Discharge: Insurance for SNF  coverage   Decrease burden of Care through IP rehab admission: n/a   Possible need for SNF placement upon discharge:Not anticipated.  Pt with supportive family and plans to d/c home with her brother until she fully recovers.    Patient Condition: This patient's medical and functional status has changed since the consult dated: 02/13/2020 in which the Rehabilitation Physician determined and documented that the patient's condition is appropriate for intensive rehabilitative care in an inpatient rehabilitation facility. See "History of Present Illness" (above) for medical update. Functional changes are: pt min to mod assist with transfers. Patient's medical and functional status update has been discussed with the Rehabilitation physician  and patient remains appropriate for inpatient rehabilitation. Will admit to inpatient rehab today.  Preadmission Screen Completed By:  Michel Santee, PT, DPT 02/14/2020 12:26 PM ______________________________________________________________________   Discussed status with Dr. Ranell Patrick on 02/17/20 at 10:23 AM and received approval for admission today.  Admission Coordinator:  Michel Santee, PT, DPT time 10:24 AM Sudie Grumbling 02/17/20

## 2020-02-14 NOTE — Progress Notes (Signed)
Occupational Therapy Treatment Patient Details Name: [name] MRN: [id number] DOB: 11-Aug-1940 Today's Date: 02/14/2020    History of present illness Pt is an 80 y/o female admitted secondary to L sided weakness. Also found to be hypertensive. MRI negative. PMH includes CAD, DM, CVA, TAVR, and CKD.   OT comments  Patient continues to progress toward stated patient/OT goals.  Patient demonstrated improved edge of bed balance, needing supervision, and transfer ability, needing Min Guard.  L sided residual weakness remains, but patient able to complete transfer to the recliner and seated grooming.  Continue to follow in the acute setting for an eventual transition to CIR for needed post acute rehab given prior level of independence.    Follow Up Recommendations  CIR    Equipment Recommendations  None recommended by OT    Recommendations for Other Services      Precautions / Restrictions Precautions Precautions: Fall Restrictions Weight Bearing Restrictions: No       Mobility Bed Mobility Overal bed mobility: Needs Assistance Bed Mobility: Supine to Sit     Supine to sit: Supervision        Transfers Overall transfer level: Needs assistance Equipment used: Rolling walker (2 wheeled) Transfers: Sit to/from [employer] Sit to Stand: Min guard Stand pivot transfers: Min guard       General transfer comment: better balance noted, continues with difficulty L leg weakness    Balance Overall balance assessment: Needs assistance Sitting-balance support: Feet supported Sitting balance-Leahy Scale: Fair     Standing balance support: During functional activity;Bilateral upper extremity supported Standing balance-Leahy Scale: Poor Standing balance comment: needs external support                           ADL either performed or assessed with clinical judgement   ADL   Eating/Feeding: Set up;Sitting   Grooming: Set up;Sitting                                Functional mobility during ADLs: Minimal assistance;Rolling walker                                                                                           Pertinent Vitals/ Pain       Pain Assessment: No/denies pain                                                          Frequency  Min 2X/week        Progress Toward Goals  OT Goals(current goals can now be found in the care plan section)  Progress towards OT goals: Progressing toward goals  Acute Rehab OT Goals Patient Stated Goal: To return home OT Goal Formulation: With patient Time For Goal Achievement: 02/26/20 Potential to Achieve Goals: Good  Plan      Co-evaluation  AM-PAC OT "6 Clicks" Daily Activity     Outcome Measure   Help from another person eating meals?: None Help from another person taking care of personal grooming?: None Help from another person toileting, which includes using toliet, bedpan, or urinal?: A Little Help from another person bathing (including washing, rinsing, drying)?: A Lot Help from another person to put on and taking off regular upper body clothing?: A Little Help from another person to put on and taking off regular lower body clothing?: A Lot 6 Click Score: 18    End of Session Equipment Utilized During Treatment: Gait belt;Rolling walker      Activity Tolerance Patient tolerated treatment well   Patient Left in chair;with call bell/phone within reach;with family/visitor present   Nurse Communication Mobility status        Time: [id number] OT Time Calculation (min): 22 min  Charges: OT General Charges $OT Visit: 1 Visit OT Treatments $Self Care/Home Management : 8-22 mins  02/14/2020  Rich, OTR/L  Acute Rehabilitation Services  Office:  [phone]    [name] 02/14/2020, 8:46 AM

## 2020-02-14 NOTE — Progress Notes (Shared)
PMR Admission Coordinator Pre-Admission Assessment   Patient: [name] is an 80 y.o., female MRN: [id number] DOB: 1940-07-28 Height: '5\' 3"'$  (160 cm) Weight: 67.3 kg                                                                                                                                                  Insurance Information HMO: yes    PPO:      PCP:      IPA:      80/20:      OTHER:  PRIMARY: [employer]      Policy#: ***      Subscriber: *** CM Name: ***      Phone#: ***     Fax#: *** Pre-Cert#: ***      Employer: *** Benefits:  Phone #: ***     Name: *** Eff. Date: ***     Deduct: ***      Out of Pocket Max: ***      Life Max: ***  CIR: ***      SNF: *** Outpatient: ***     Co-Pay: *** Home Health: ***      Co-Pay: *** DME: ***     Co-Pay: *** Providers: *** SECONDARY:       Policy#:       Phone#:    Financial Counselor:       Phone#:    The "Data Collection Information Summary" for patients in Inpatient Rehabilitation Facilities with attached "Privacy Act [place] Records" was provided and verbally reviewed with: Patient and Family   Emergency Contact Information         Contact Information     Name Relation Home Work [place] Other     [phone]       Current Medical History  Patient Admitting Diagnosis: CVA    History of Present Illness: [name] is a 79 y.o. right-handed female with history of diabetes mellitus, CKD followed by Dr. Hollie Salk, hyperlipidemia, history of CVA x2 without residual weakness including right MCA stenting, obesity with BMI 26.28, CAD with TVAR procedure.  Per chart review patient lives alone independent prior to admission.  Presented 02/08/2020 with headache transient left arm weakness and numbness.  Cranial CT scan negative for acute changes.  Patient did not receive TPA.  MRI showed interval development of 2 foci of acute parenchymal hemorrhage within the paramedian mid to posterior right frontal lobe  largest measuring 1.2 cm with mild surrounding vasogenic edema.  2 unchanged 4 mm acute early subacute infarct within the posterior right frontal lobe.  Admission chemistries BUN 27, creatinine 2.49, hemoglobin A1c 7.7.  Neurology follow-up close monitoring of blood pressure and awaiting plan for loop recorder placement and awaiting plan for loop recorder placement.  Therapy evaluations completed with recommendations of CIR.  Complete NIHSS TOTAL: 0 Glasgow Coma Scale Score: 15   Past Medical History      Past Medical History:  Diagnosis Date  . Arthritis    . Cerebrovascular disease    . CKD (chronic kidney disease)      Sees Dr Hollie Salk  . Coronary artery disease    . Depression    . Diabetes ([place])      INSULIN DEPENDENT  . Diabetic peripheral neuropathy ([place])    . Diabetic retinopathy ([place])    . Diverticulitis    . GERD (gastroesophageal reflux disease)    . History of CVA (cerebrovascular accident)      x 2 no residulal  . Hyperlipidemia    . Hypertension    . Obesity    . Renal lesion    . 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 ORoxy Mannsand Dr. MAngelena Form . Severe aortic stenosis        Family History  family history includes Diabetes in her mother; Heart disease in her mother; Hypertension in her brother; Prostate cancer in her brother and father.   Prior Rehab/Hospitalizations:  Has the patient had prior rehab or hospitalizations prior to admission? No   Has the patient had major surgery during 100 days prior to admission? No   Current Medications    Current Facility-Administered Medications:  .  0.9 %  sodium chloride infusion, , Intravenous, Continuous, Bhagat, Srishti L, MD, Stopped at 02/11/20 1121 .  acetaminophen (TYLENOL) tablet 650 mg, 650 mg, Oral, Q6H PRN, 650 mg at 02/12/20 1919 **OR** acetaminophen (TYLENOL) suppository 650 mg, 650 mg, Rectal, Q6H PRN, WMaudie Mercury MD .   amLODipine (NORVASC) tablet 10 mg, 10 mg, Oral, Daily, Katsadouros, Vasilios, MD, 10 mg at 02/14/20 [id number].  Chlorhexidine Gluconate Cloth 2 % PADS 6 each, 6 each, Topical, Daily, HLucious Groves DO, 6 each at 02/13/20 1600 .  hydrALAZINE (APRESOLINE) injection 5 mg, 5 mg, Intravenous, Q4H PRN, Bailey-Modzik, Delila A, NP .  hydrALAZINE (APRESOLINE) tablet 50 mg, 50 mg, Oral, Q8H, Hoffman, Erik C, DO, 50 mg at 02/14/20 0603 .  insulin aspart (novoLOG) injection 0-15 Units, 0-15 Units, Subcutaneous, TID WC, WMaudie Mercury MD, 2 Units at 02/14/20 [phone].  insulin glargine (LANTUS) injection 10 Units, 10 Units, Subcutaneous, QHS, WMaudie Mercury MD, 10 Units at 02/13/20 2103 .  isosorbide mononitrate (IMDUR) 24 hr tablet 30 mg, 30 mg, Oral, Daily, Hoffman, Erik C, DO, 30 mg at 02/14/20 [id number].  labetalol (NORMODYNE) tablet 200 mg, 200 mg, Oral, BID, Katsadouros, Vasilios, MD, 200 mg at 02/14/20 0832 .  LORazepam (ATIVAN) injection 0.5 mg, 0.5 mg, Intravenous, Once, Bhagat, Srishti L, MD .  polyethylene glycol (MIRALAX / GLYCOLAX) packet 17 g, 17 g, Oral, Daily, WAlexandria Lodge MD, 17 g at 02/14/20 1014 .  rosuvastatin (CRESTOR) tablet 40 mg, 40 mg, Oral, Daily, XRosalin Hawking MD, 40 mg at 02/14/20 [id number].  senna-docusate (Senokot-S) tablet 1 tablet, 1 tablet, Oral, BID, WAlexandria Lodge MD, 1 tablet at 02/14/20 1014 .  torsemide (DEMADEX) tablet 40 mg, 40 mg, Oral, Daily, HLucious Groves DO, 40 mg at 02/14/20 [id number].  venlafaxine XR (EFFEXOR-XR) 24 hr capsule 150 mg, 150 mg, Oral, Daily, HLucious Groves DO, 150 mg at 02/14/20 [id number]  Patients Current Diet:     Diet Order  Diet renal/carb modified with fluid restriction Diet-HS Snack? Nothing; Fluid restriction: 1200 mL Fluid; Room service appropriate? Yes; Fluid consistency: Thin  Diet effective now                      Precautions / Restrictions Precautions Precautions: Fall Restrictions Weight Bearing Restrictions: No     Has the patient had 2 or more falls or a fall with injury in the past year?No   Prior Activity Level Community (5-7x/wk): full independent prior to admission   Prior Functional Level Prior Function Level of Independence: Independent   Self Care: Did the patient need help bathing, dressing, using the toilet or eating?  Independent   Indoor Mobility: Did the patient need assistance with walking from room to room (with or without device)? Independent   Stairs: Did the patient need assistance with internal or external stairs (with or without device)? Independent   Functional Cognition: Did the patient need help planning regular tasks such as shopping or remembering to take medications? Independent   Home Assistive Devices / Equipment Home Assistive Devices/Equipment: None Home Equipment: Shower seat,Cane - single point   Prior Device Use: Indicate devices/aids used by the patient prior to current illness, exacerbation or injury? None of the above   Current Functional Level Cognition   Overall Cognitive Status: Impaired/Different from baseline Orientation Level: Oriented X4 General Comments: pt received ativan earlier for scheduled MRI    Extremity Assessment (includes Sensation/Coordination)   Upper Extremity Assessment: Generalized weakness  Lower Extremity Assessment: Defer to PT evaluation     ADLs   Overall ADL's : Needs assistance/impaired Eating/Feeding: Set up,Sitting Grooming: Set up,Sitting Upper Body Dressing : Minimal assistance,Sitting Lower Body Dressing: Moderate assistance,Sitting/lateral leans Functional mobility during ADLs: Minimal assistance,Rolling walker General ADL Comments: multiple LOB backwards     Mobility   Overal bed mobility: Needs Assistance Bed Mobility: Supine to Sit Supine to sit: Supervision Sit to supine: Supervision General bed mobility comments: pt up in chair upon PT arrival     Transfers   Overall transfer level: Needs  assistance Equipment used: Rolling walker (2 wheeled) Transfers: Sit to/from [employer] Sit to Stand: Min guard Stand pivot transfers: Min guard General transfer comment: better balance noted, continues with difficulty L leg weakness     Ambulation / Gait / Stairs / Wheelchair Mobility   Ambulation/Gait Ambulation/Gait assistance: Counsellor (Feet): 5 Feet Assistive device: 1 person hand held assist Gait Pattern/deviations: Step-through pattern,Decreased stride length General Gait Details: attempted to march in place in RW however pt with strong L lateral lean, with tactile and verbal cues able to correct but then became nauseated and was very sleepy Gait velocity: Decreased     Posture / Balance Dynamic Sitting Balance Sitting balance - Comments: lateral lean L Balance Overall balance assessment: Needs assistance Sitting-balance support: Feet supported Sitting balance-Leahy Scale: Fair Sitting balance - Comments: lateral lean L Standing balance support: During functional activity,Bilateral upper extremity supported Standing balance-Leahy Scale: Poor Standing balance comment: needs external support     Special needs/care consideration Diabetic management yes        Previous Home Environment (from acute therapy documentation) Living Arrangements: Alone Available Help at Discharge: Family,Available 24 hours/day Type of Home: House Home Layout: Two level Alternate Level Stairs-Rails: Left,Right Alternate Level Stairs-Number of Steps: flight Home Access: Stairs to enter Entrance Stairs-Rails: None Technical brewer of Steps: 2 Bathroom Shower/Tub: Multimedia programmer: Standard Home Care Services: No  Discharge Living Setting Plans for Discharge Living Setting: Lives with (comment),Other (Comment) (brother) Type of Home at Discharge: House Discharge Home Layout: Two level Alternate Level Stairs-Rails: Left,Right Alternate  Level Stairs-Number of Steps: flight Discharge Home Access: Stairs to enter Entrance Stairs-Rails: None Entrance Stairs-Number of Steps: 2 Discharge Bathroom Shower/Tub: Walk-in shower Discharge Bathroom Toilet: Standard Discharge Bathroom Accessibility: Yes How Accessible: Accessible via walker Does the patient have any problems obtaining your medications?: No   Social/Family/Support Systems Patient Roles: Parent Contact Information: 2 adult children (youngest is in college) Anticipated Caregiver: brother, Lorre Nick Anticipated Caregiver's Contact Information: [phone] Ability/Limitations of Caregiver: none Caregiver Availability: 24/7 Discharge Plan Discussed with Primary Caregiver: Yes Is Caregiver In Agreement with Plan?: Yes Does Caregiver/Family have Issues with Lodging/Transportation while Pt is in Rehab?: No     Goals Patient/Family Goal for Rehab: PT/OT/SLP supervision to mod I Expected length of stay: 14-16 days Pt/Family Agrees to Admission and willing to participate: Yes Program Orientation Provided & Reviewed with Pt/Caregiver Including Roles  & Responsibilities: Yes  Barriers to Discharge: Insurance for SNF coverage     Decrease burden of Care through IP rehab admission: n/a     Possible need for SNF placement upon discharge:Not anticipated.  Pt with supportive family and plans to d/c home with her brother until she fully recovers.      Patient Condition: This patient's medical and functional status has changed since the consult dated: 02/13/2020 in which the Rehabilitation Physician determined and documented that the patient's condition is appropriate for intensive rehabilitative care in an inpatient rehabilitation facility. See "History of Present Illness" (above) for medical update. Functional changes are: pt min to mod assist with transfers. Patient's medical and functional status update has been discussed with the Rehabilitation physician and patient  remains appropriate for inpatient rehabilitation. Will admit to inpatient rehab today.   Preadmission Screen Completed By:  Michel Santee, PT, 02/14/2020 12:26 PM

## 2020-02-14 NOTE — Progress Notes (Signed)
PT Cancellation Note  Patient Details Name: [name] MRN: [id number] DOB: 1940-09-25   Cancelled Treatment:    Reason Eval/Treat Not Completed: Patient at procedure or test/unavailable pt off floor at repeat CT due to worsening LLE per nurse. Will follow.   [name] 02/14/2020, 11:24 AM Marisa Severin, PT, DPT Acute Rehabilitation Services Pager [phone] Office [phone]

## 2020-02-14 NOTE — Progress Notes (Signed)
Inpatient Rehab Admissions Coordinator:   Spoke to patient over the phone to discuss CIR.  I explained goals of supervision to mod I, and that recommendation would likely be that she have someone who can stay with her 24/7 (at least initially) on discharge.  She reports she will d/c to her brother's home after CIR and he can provide adequate support.  She also has 2 daughters in the area who can help intermittently.  We discussed estimated length of stay to be about 2 weeks, dependent on progress while on rehab.  I will start insurance authorization today and f/u with patient on Monday.   Shann Medal, PT, DPT Admissions Coordinator [phone] 02/14/20  12:22 PM

## 2020-02-14 NOTE — Progress Notes (Addendum)
Subjective:   No acute events overnight.   During evaluation at bedside, patient states she is doing well this morning. She does endorse her left leg feeling "heavier" since yesterday evening. She denies numbness, tingling. Reports she has not had a BM for the past few days. Per RN, patient's exam was stable overnight without increased weakness in the left lower extremity.  Objective:  Vital signs in last 24 hours: Vitals:   02/14/20 0200 02/14/20 0300 02/14/20 0400 02/14/20 0500  BP: (!) 158/43 (!) 148/43 (!) 144/47 (!) 157/47  Pulse: (!) 58 (!) 57 (!) 55 (!) 55  Resp: '12 19 19 18  '$ Temp:      TempSrc:      SpO2: 100% 100% 100% 100%  Weight:      Height:       Supplemental O2: Room Air SpO2: 100 %   Physical Exam:   Constitutional: well-appearing resting in bed comforbtly HENT: normocephalic atraumatic. Cardiovascular: Systolic murmur appreciated, best heard over second right intercostal space. Normal rate and rhythm Pulmonary/Chest: normal work of breathing on room air, lungs clear to auscultation bilaterally Neurological: alert & oriented x 3.  Normal extremity sensation. Weakness of left lower extremity 3/5.  Upper extremities without weakness.  Skin: warm and dry  Filed Weights   02/08/20 0611 02/11/20 0415 02/12/20 0400  Weight: 73.9 kg 70.9 kg 67.3 kg    Intake/Output Summary (Last 24 hours) at 02/14/2020 0536 Last data filed at 02/13/2020 1800 Gross per 24 hour  Intake --  Output 1200 ml  Net -1200 ml   Net IO Since Admission: -4,378.89 mL [02/14/20 0536]  Pertinent Labs: CBC Latest Ref Rng & Units 02/14/2020 02/13/2020 02/12/2020  WBC 4.0 - 10.5 K/uL 8.1 8.2 7.0  Hemoglobin 12.0 - 15.0 g/dL 11.1(L) 10.9(L) 11.4(L)  Hematocrit 36.0 - 46.0 % 33.0(L) 32.4(L) 34.2(L)  Platelets 150 - 400 K/uL 163 123(L) 120(L)    CMP Latest Ref Rng & Units 02/14/2020 02/13/2020 02/12/2020  Glucose 70 - 99 mg/dL 133(H) 129(H) 128(H)  BUN 8 - 23 mg/dL 41(H) 37(H) 33(H)   Creatinine 0.44 - 1.00 mg/dL 2.85(H) 2.91(H) 2.65(H)  Sodium 135 - 145 mmol/L 136 134(L) 136  Potassium 3.5 - 5.1 mmol/L 3.7 3.7 3.9  Chloride 98 - 111 mmol/L 102 103 103  CO2 22 - 32 mmol/L 22 19(L) 20(L)  Calcium 8.9 - 10.3 mg/dL 8.8(L) 8.4(L) 8.8(L)  Total Protein 6.5 - 8.1 g/dL - - -  Total Bilirubin 0.3 - 1.2 mg/dL - - -  Alkaline Phos 38 - 126 U/L - - -  AST 15 - 41 U/L - - -  ALT 0 - 44 U/L - - -    Imaging: No results found.  Assessment/Plan:   Active Problems:   Weakness   Acute left-sided weakness   Thrombocytopenia (HCC)   Cerebral embolism with cerebral infarction   Patient Summary: [name] is a 80 y.o. with pertinent PMH of HTN, HLD, T2DM, CKD Stg III/IV, h/o of CVA R. MCA 2015 s/p stent with subsequent occlusion, CVA R. PCA 08/2018, multiple TIA's, CAD RCA stent placement, TAVR for severe AS 11/2018 who presented with weakness and admit for further neurological workup  Weakness Likely 2/2 to Elevated Blood Pressure Acute Punctate Infarct right ACA Cortex s/p TPA Hemorrhagic Conversion Paramedian mid to Posterior Right Frontal Lobe  Hx of CVA and TIA 1/22 MRI Brain w/o C - No acute infarct/hemorrhage 1/25 patient with increased weakness, repeat MRI - acute punctate  infarct. Given tPA 1/26 MRI revealed hemorrhagic conversion, repeat CT at 1800 unchanged small paramedian right frontal lobe hemorrhage.  Neuro signing off today recommending patient resume aspirin on 02/17/20 if she remains without neurological changes. Patient endorses heaviness of left lower extremity. Does appear to have worsening of her weakness overnight. Possibly due to local edema, but with recent hemorrhage will repeat CT scan head w/o constrast.  -repeat CT head w/o contrast pending -strict BP parameters - <180/105 -frequent neuro checks - STAT Head CT if positive -no echo, patient had one earlier December -hold all anticoagulation -PT/OT CIR -continue home medications  amlodipine,torsemide, continue to hold labetolol if pt bradycardic -continue imdur -Crestor increased to 40 mg daily -loop recorder per EP -follow up with neuro outpatient in 4 weeks  Thrombocytopenia - resolved Plt improved to 163 -ctm -daily cbc  Hx of TAVR 11/2018 HFpEF Last echo 12/23/19 - LVEF 56%, Aortic valve replacement without regurgitation or leak. Mild mitral and tricuspid regurgitation. Do not believe contributing to wide pulse pressures, appears to be chronic.   CKD Stg III/IV Cr of 2.85 Baseline appears to be 2.5-2.9.  -will continue to monitor -avoid nephro toxic medications  CAD Troponemia  Hx of RCA stent placement 12/2013 Troponin trend was flat.   -cardiac monitoring -Hold plavix until hemorrhagic region reabsorbed or near reabsorbation  Hx T2DM A1C 7.7. - SSI during her admission - Ct outpatient regimen at discharge  Anemia of Chronic Disease:  Patient remains normocytic, hgb stable.  -Daily cbc  Diet: Regular Diet VTE: None, being held at this time in setting of hemorrhage IVF: None,None Code: Full  Dispo: Pending further neurological evaluation, paln to dispo to SNF  Sanjuana Letters, DO Internal Medicine Resident PGY-1 Pager [phone] Please contact the on call pager after 5 pm and on weekends at [phone].

## 2020-02-14 NOTE — Progress Notes (Signed)
Physical Therapy Treatment Patient Details Name: [name] MRN: [id number] DOB: [birth year] Today's Date: 02/14/2020    History of Present Illness Pt is an 80 y/o female admitted secondary to L sided weakness. Also found to be hypertensive. MRI 1/26-two foci of acute parenchymal hemorrhage  within the paramedian mid to posterior right frontal lobe and Two unchanged 4 mm acute/early subacute infarcts within the  posterior right frontal lobe. PMH includes CAD, DM, CVA, TAVR, and CKD.    PT Comments    Patient progressing well towards PT goals. Reports heavy feeling in LLE that improved throughout session. Focused on gait training with Min guard-Min A and use of RW for support. Some difficulty advancing LLE initially with therapist providing assist for weight shifting as well as uncontrolled descent of LLE during initial contact. Requires Min-mod A to stand depending on how low the surface is with cues for hand placement. Pt favors left lateral lean sitting EOB but able to self correct with cues. Reports difficulty performing fine motor tasks with left hand. Motivated to get to rehab. Will follow.    Follow Up Recommendations  CIR     Equipment Recommendations  Rolling walker with 5" wheels    Recommendations for Other Services       Precautions / Restrictions Precautions Precautions: Fall;Other (comment) Precaution Comments: LLE weakness Restrictions Weight Bearing Restrictions: No    Mobility  Bed Mobility Overal bed mobility: Needs Assistance Bed Mobility: Supine to Sit     Supine to sit: Supervision;HOB elevated Sit to supine: Min assist   General bed mobility comments: No assist needed, increased time. Assist needed to bring LLE into bed to return to supine. Able to scoot self up in bed with cues to flex knees and reach for rails.  Transfers Overall transfer level: Needs assistance Equipment used: Rolling walker (2 wheeled) Transfers: Sit to/from Stand Sit to Stand:  Min assist;Mod assist         General transfer comment: Assist to power to standing with cues for hand placement as pt trying to pull up on RW; stood from EOB x1, from chair x2, from toilet x1. More assist needed from lower surface.  Ambulation/Gait Ambulation/Gait assistance: Min assist;Min guard Gait Distance (Feet): 26 Feet (+44' +26') Assistive device: Rolling walker (2 wheeled) Gait Pattern/deviations: Step-through pattern;Decreased stride length;Decreased step length - right;Step-to pattern Gait velocity: Decreased   General Gait Details: Slow, mildly unsteady gait with uncontrolled movement of LLE during initial contact, decreased knee flexion during swing and overall difficulty advancing LLE; improved with increased distance. Assist with RW management. 2 seated rest breaks due to fatigue.   Stairs             Wheelchair Mobility    Modified Rankin (Stroke Patients Only) Modified Rankin (Stroke Patients Only) Pre-Morbid Rankin Score: No symptoms Modified Rankin: Moderately severe disability     Balance Overall balance assessment: Needs assistance Sitting-balance support: Feet supported;No upper extremity supported Sitting balance-Leahy Scale: Fair Sitting balance - Comments: lateral lean Left, min guard for safety.   Standing balance support: During functional activity Standing balance-Leahy Scale: Poor Standing balance comment: needs external support                            Cognition Arousal/Alertness: Awake/alert Behavior During Therapy: WFL for tasks assessed/performed Overall Cognitive Status: Within Functional Limits for tasks assessed  General Comments: Appears WFL for basic mobility tasks, Follows commands well. Able to recall conversation with CIR coordinator earlier.      Exercises      General Comments General comments (skin integrity, edema, etc.): Brother present during session.       Pertinent Vitals/Pain Pain Assessment: No/denies pain    Home Living                      Prior Function            PT Goals (current goals can now be found in the care plan section) Progress towards PT goals: Progressing toward goals    Frequency    Min 4X/week      PT Plan Current plan remains appropriate    Co-evaluation              AM-PAC PT "6 Clicks" Mobility   Outcome Measure  Help needed turning from your back to your side while in a flat bed without using bedrails?: None Help needed moving from lying on your back to sitting on the side of a flat bed without using bedrails?: A Little Help needed moving to and from a bed to a chair (including a wheelchair)?: A Little Help needed standing up from a chair using your arms (e.g., wheelchair or bedside chair)?: A Little Help needed to walk in hospital room?: A Little Help needed climbing 3-5 steps with a railing? : A Lot 6 Click Score: 18    End of Session Equipment Utilized During Treatment: Gait belt Activity Tolerance: Patient tolerated treatment well Patient left: in bed;with call bell/phone within reach;with family/visitor present Nurse Communication: Mobility status PT Visit Diagnosis: Unsteadiness on feet (R26.81);Muscle weakness (generalized) (M62.81);Difficulty in walking, not elsewhere classified (R26.2);Hemiplegia and hemiparesis Hemiplegia - Right/Left: Left Hemiplegia - dominant/non-dominant: Non-dominant Hemiplegia - caused by: Unspecified     Time: [id number] PT Time Calculation (min) (ACUTE ONLY): 34 min  Charges:  $Gait Training: 23-37 mins                     Marisa Severin, PT, DPT Acute Rehabilitation Services Pager [phone] Office [place] 02/14/2020, 3:50 PM

## 2020-02-15 DIAGNOSIS — I2510 Atherosclerotic heart disease of native coronary artery without angina pectoris: Secondary | ICD-10-CM

## 2020-02-15 DIAGNOSIS — N184 Chronic kidney disease, stage 4 (severe): Secondary | ICD-10-CM

## 2020-02-15 DIAGNOSIS — E119 Type 2 diabetes mellitus without complications: Secondary | ICD-10-CM

## 2020-02-15 LAB — GLUCOSE, CAPILLARY
Glucose-Capillary: 170 mg/dL — ABNORMAL HIGH (ref 70–99)
Glucose-Capillary: 198 mg/dL — ABNORMAL HIGH (ref 70–99)
Glucose-Capillary: 153 mg/dL — ABNORMAL HIGH (ref 70–99)
Glucose-Capillary: 156 mg/dL — ABNORMAL HIGH (ref 70–99)

## 2020-02-15 LAB — CBC
HCT: 35.2 % — ABNORMAL LOW (ref 36.0–46.0)
Hemoglobin: 11.3 g/dL — ABNORMAL LOW (ref 12.0–15.0)
MCH: 29 pg (ref 26.0–34.0)
MCHC: 32.1 g/dL (ref 30.0–36.0)
MCV: 90.5 fL (ref 80.0–100.0)
Platelets: 170 10*3/uL (ref 150–400)
RBC: 3.89 MIL/uL (ref 3.87–5.11)
RDW: 13.3 % (ref 11.5–15.5)
WBC: 8.1 10*3/uL (ref 4.0–10.5)
nRBC: 0 % (ref 0.0–0.2)

## 2020-02-15 LAB — BASIC METABOLIC PANEL
Anion gap: 10 (ref 5–15)
BUN: 46 mg/dL — ABNORMAL HIGH (ref 8–23)
CO2: 22 mmol/L (ref 22–32)
Calcium: 9.1 mg/dL (ref 8.9–10.3)
Chloride: 105 mmol/L (ref 98–111)
Creatinine, Ser: 2.79 mg/dL — ABNORMAL HIGH (ref 0.44–1.00)
GFR, Estimated: 17 mL/min — ABNORMAL LOW (ref >60)
Glucose, Bld: 157 mg/dL — ABNORMAL HIGH (ref 70–99)
Potassium: 4.1 mmol/L (ref 3.5–5.1)
Sodium: 137 mmol/L (ref 135–145)

## 2020-02-15 MED ORDER — SENNOSIDES-DOCUSATE SODIUM 8.6-50 MG PO TABS
2.0000 | ORAL_TABLET | Freq: Two times a day (BID) | ORAL | Status: DC
  Administered 2020-02-15 – 2020-02-17 (×5): 2 via ORAL
  Filled 2020-02-15 (×5): qty 2

## 2020-02-15 NOTE — Progress Notes (Signed)
Subjective:   No acute events overnight.   Patient seen and evaluated at bedside. She states that she feels like her strength is slowly returning. She is interested in going to CIR, and will have a further discussion with them on Monday. She denies having a bowel movement yesterday. She is otherwise doing well. All questions and concerns were addressed at bedside.   Objective:  Vital signs in last 24 hours: Vitals:   02/15/20 0300 02/15/20 0400 02/15/20 0547 02/15/20 0600  BP: (!) 152/40 (!) 148/51 (!) 161/56 (!) 160/55  Pulse: (!) 53 63  (!) 57  Resp: 17 (!) 23  (!) 36  Temp:      TempSrc:      SpO2: 100% 100%  100%  Weight:      Height:       Supplemental O2: Room Air SpO2: 100 %   Physical Exam:   Physical Exam Constitutional:      Comments: Resting comfortably in bedside chair, answers questions appropriately. NAD.   HENT:     Head: Normocephalic and atraumatic.  Cardiovascular:     Rate and Rhythm: Normal rate and regular rhythm.     Pulses: Normal pulses.     Heart sounds: Normal heart sounds. No murmur heard. No friction rub. No gallop.   Pulmonary:     Effort: Pulmonary effort is normal.     Breath sounds: Normal breath sounds. No wheezing, rhonchi or rales.  Abdominal:     General: Abdomen is flat. Bowel sounds are normal.     Tenderness: There is no abdominal tenderness.  Musculoskeletal:     Comments: Upper arms bilaterally 5/5, LLE 3/5, RLE 5/5  Neurological:     Mental Status: She is alert and oriented to person, place, and time.  Psychiatric:        Mood and Affect: Mood normal.        Behavior: Behavior normal.     Filed Weights   02/08/20 0611 02/11/20 0415 02/12/20 0400  Weight: 73.9 kg 70.9 kg 67.3 kg    Intake/Output Summary (Last 24 hours) at 02/15/2020 0615 Last data filed at 02/14/2020 1800 Gross per 24 hour  Intake --  Output 300 ml  Net -300 ml   Net IO Since Admission: -5,278.89 mL [02/15/20 0615]  Pertinent Labs: CBC  Latest Ref Rng & Units 02/14/2020 02/13/2020 02/12/2020  WBC 4.0 - 10.5 K/uL 8.1 8.2 7.0  Hemoglobin 12.0 - 15.0 g/dL 11.1(L) 10.9(L) 11.4(L)  Hematocrit 36.0 - 46.0 % 33.0(L) 32.4(L) 34.2(L)  Platelets 150 - 400 K/uL 163 123(L) 120(L)    CMP Latest Ref Rng & Units 02/14/2020 02/13/2020 02/12/2020  Glucose 70 - 99 mg/dL 133(H) 129(H) 128(H)  BUN 8 - 23 mg/dL 41(H) 37(H) 33(H)  Creatinine 0.44 - 1.00 mg/dL 2.85(H) 2.91(H) 2.65(H)  Sodium 135 - 145 mmol/L 136 134(L) 136  Potassium 3.5 - 5.1 mmol/L 3.7 3.7 3.9  Chloride 98 - 111 mmol/L 102 103 103  CO2 22 - 32 mmol/L 22 19(L) 20(L)  Calcium 8.9 - 10.3 mg/dL 8.8(L) 8.4(L) 8.8(L)  Total Protein 6.5 - 8.1 g/dL - - -  Total Bilirubin 0.3 - 1.2 mg/dL - - -  Alkaline Phos 38 - 126 U/L - - -  AST 15 - 41 U/L - - -  ALT 0 - 44 U/L - - -    Imaging: CT HEAD WO CONTRAST  Result Date: 02/14/2020 CLINICAL DATA:  Intracranial hemorrhage follow-up EXAM: CT HEAD WITHOUT CONTRAST TECHNIQUE:  Contiguous axial images were obtained from the base of the skull through the vertex without intravenous contrast. COMPARISON:  02/12/2020 FINDINGS: Brain: Stable adjacent foci of hemorrhage in the paramedian right frontal lobe. Stable mild associated edema. No new hemorrhage. No significant mass effect. Chronic right frontal and occipital infarcts. Stable findings of probable mild chronic microvascular ischemic changes. Ventricles are stable in size. Vascular: No new findings. Skull: Calvarium is unremarkable. Sinuses/Orbits: No acute finding. Other: None. IMPRESSION: Stable adjacent foci of parenchymal hemorrhage in the paramedian right frontal lobe with mild edema. No new hemorrhage or mass effect. Electronically Signed   By: Macy Mis [name]   On: 02/14/2020 11:06    Assessment/Plan:   Active Problems:   Weakness   Acute left-sided weakness   Thrombocytopenia (HCC)   Cerebral embolism with cerebral infarction   Patient Summary: [name] is a 80 y.o. with  pertinent PMH of HTN, HLD, T2DM, CKD Stg III/IV, h/o of CVA R. MCA 2015 s/p stent with subsequent occlusion, CVA R. PCA 08/2018, multiple TIA's, CAD RCA stent placement, TAVR for severe AS 11/2018 who presented with weakness and admit for further neurological workup  Weakness Likely 2/2 to Elevated Blood Pressure Acute Punctate Infarct right ACA Cortex s/p TPA Hemorrhagic Conversion Paramedian mid to Posterior Right Frontal Lobe  Hx of CVA and TIA 1/22 MRI Brain w/o C - No acute infarct/hemorrhage 1/25 patient with increased weakness, repeat MRI - acute punctate infarct. Given tPA 1/26 MRI revealed hemorrhagic conversion, repeat CT at 1800 unchanged small paramedian right frontal lobe hemorrhage. 1/28: Repeat head CT Negative  -strict BP parameters - <180/105 -frequent neuro checks -hold all anticoagulation -PT/OT->CIR -continue home medications amlodipine,torsemide, continue to hold labetolol if pt bradycardic -continue imdur -Crestor increased to 40 mg daily -loop recorder per EP -follow up with neuro outpatient in 4 weeks  Hx of TAVR 11/2018 HFpEF Last echo 12/23/19 - LVEF 56%, Aortic valve replacement without regurgitation or leak. Mild mitral and tricuspid regurgitation. Do not believe contributing to wide pulse pressures, appears to be chronic.   CKD Stg III/IV Cr of 2.79 Baseline appears to be 2.5-2.9.  -will continue to monitor -avoid nephro toxic medications  CAD Troponemia  Hx of RCA stent placement 12/2013 Troponin trend was flat.   -cardiac monitoring -Hold plavix until hemorrhagic region reabsorbed or near reabsorbation  Hx T2DM A1C 7.7. - SSI during her admission - Lantus 10  Thrombocytopenia - resolved  Diet: Regular Diet VTE: None, being held at this time in setting of hemorrhage IVF: None,None Code: Full  Dispo: Pending further neurological evaluation, paln to dispo to SNF  Maudie Mercury, MD Internal Medicine Resident PGY-2 Pager  [phone] Please contact the on call pager after 5 pm and on weekends at [phone].

## 2020-02-15 NOTE — Progress Notes (Signed)
Physical Therapy Treatment Patient Details Name: [name] MRN: [id number] DOB: 1940-04-11 Today's Date: 02/15/2020    History of Present Illness Pt is an 80 y/o female admitted secondary to L sided weakness. Also found to be hypertensive. MRI 1/26-two foci of acute parenchymal hemorrhage  within the paramedian mid to posterior right frontal lobe and Two unchanged 4 mm acute/early subacute infarcts within the  posterior right frontal lobe. PMH includes CAD, DM, CVA, TAVR, and CKD.    PT Comments    Pt tolerates treatment well, increasing gait tolerance this session. Pt continues to demonstrate LLE weakness and control deficits, reporting heaviness and numbness in the extremity. Pt is able to follow cues to increase L foot clearance, but can fatigue quickly and is unable to significantly increase her gait speed at this time. Pt will benefit from continued acute PT POC and aggressive mobilization to improve mobility quality and restore independence. PT continues to recommend CIR placement at this time.   Follow Up Recommendations  CIR     Equipment Recommendations  Rolling walker with 5" wheels    Recommendations for Other Services       Precautions / Restrictions Precautions Precautions: Fall;Other (comment) Precaution Comments: LLE weakness Restrictions Weight Bearing Restrictions: No    Mobility  Bed Mobility Overal bed mobility: Needs Assistance Bed Mobility: Supine to Sit     Supine to sit: Supervision Sit to supine: Min guard   General bed mobility comments: increased time to elevate LLE onto bed  Transfers Overall transfer level: Needs assistance Equipment used: Rolling walker (2 wheeled) Transfers: Sit to/from Stand Sit to Stand: Min guard         General transfer comment: cues for hand placement  Ambulation/Gait Ambulation/Gait assistance: Min assist;Min guard Gait Distance (Feet): 80 Feet Assistive device: Rolling walker (2 wheeled) Gait  Pattern/deviations: Step-to pattern Gait velocity: reduced Gait velocity interpretation: <1.8 ft/sec, indicate of risk for recurrent falls General Gait Details: pt with slowed step-to gait, reduced foot clearance of LLE. PT cues to increased ankle DF and hip/knee flexion   Stairs             Wheelchair Mobility    Modified Rankin (Stroke Patients Only) Modified Rankin (Stroke Patients Only) Pre-Morbid Rankin Score: No symptoms Modified Rankin: Moderately severe disability     Balance Overall balance assessment: Needs assistance Sitting-balance support: No upper extremity supported;Feet supported Sitting balance-Leahy Scale: Good     Standing balance support: Single extremity supported;Bilateral upper extremity supported Standing balance-Leahy Scale: Poor Standing balance comment: reliant on UE support of RW                            Cognition Arousal/Alertness: Awake/alert Behavior During Therapy: WFL for tasks assessed/performed Overall Cognitive Status: Within Functional Limits for tasks assessed                                        Exercises      General Comments General comments (skin integrity, edema, etc.): VSS on RA, brother present      Pertinent Vitals/Pain Pain Assessment: Faces Faces Pain Scale: Hurts little more Pain Location: left low back Pain Descriptors / Indicators: Grimacing Pain Intervention(s): Monitored during session    Home Living  Prior Function            PT Goals (current goals can now be found in the care plan section) Acute Rehab PT Goals Patient Stated Goal: To return home after rehab Progress towards PT goals: Progressing toward goals    Frequency    Min 4X/week      PT Plan Current plan remains appropriate    Co-evaluation              AM-PAC PT "6 Clicks" Mobility   Outcome Measure  Help needed turning from your back to your side while in a  flat bed without using bedrails?: None Help needed moving from lying on your back to sitting on the side of a flat bed without using bedrails?: A Little Help needed moving to and from a bed to a chair (including a wheelchair)?: A Little Help needed standing up from a chair using your arms (e.g., wheelchair or bedside chair)?: A Little Help needed to walk in hospital room?: A Little Help needed climbing 3-5 steps with a railing? : A Lot 6 Click Score: 18    End of Session Equipment Utilized During Treatment: Gait belt Activity Tolerance: Patient tolerated treatment well Patient left: in bed;with call bell/phone within reach;with bed alarm set Nurse Communication: Mobility status PT Visit Diagnosis: Unsteadiness on feet (R26.81);Muscle weakness (generalized) (M62.81);Difficulty in walking, not elsewhere classified (R26.2);Hemiplegia and hemiparesis Hemiplegia - Right/Left: Left Hemiplegia - dominant/non-dominant: Non-dominant Hemiplegia - caused by: Unspecified     Time: [id number] PT Time Calculation (min) (ACUTE ONLY): 31 min  Charges:  $Gait Training: 8-22 mins $Therapeutic Exercise: 8-22 mins                     Zenaida Niece, PT, DPT Acute Rehabilitation Pager: [phone]    Zenaida Niece 02/15/2020, 3:02 PM

## 2020-02-16 LAB — GLUCOSE, CAPILLARY
Glucose-Capillary: 138 mg/dL — ABNORMAL HIGH (ref 70–99)
Glucose-Capillary: 131 mg/dL — ABNORMAL HIGH (ref 70–99)
Glucose-Capillary: 112 mg/dL — ABNORMAL HIGH (ref 70–99)
Glucose-Capillary: 181 mg/dL — ABNORMAL HIGH (ref 70–99)
Glucose-Capillary: 140 mg/dL — ABNORMAL HIGH (ref 70–99)

## 2020-02-16 NOTE — Progress Notes (Addendum)
Subjective:   Overnight: On call team yesterday informed patient had rate controlled afib  Patient states that she is doing well today. She states that her weakness seems to be improving from yesterday. She denies any new weakness, numbness, tingling. Eating/drinking well. Passing her bowels and urinating without difficulty.  She states that CIR talked with her and plan to give her an update Monday on progression of placement.   Objective:  Vital signs in last 24 hours: Vitals:   02/15/20 1800 02/15/20 2031 02/15/20 2347 02/16/20 0401  BP: (!) 152/59 (!) 153/81 (!) 136/46 (!) 154/57  Pulse: 67 68 61 68  Resp: '16 17 17 16  '$ Temp:  98.2 F (36.8 C) 98 F (36.7 C) 98 F (36.7 C)  TempSrc:  Oral Oral Oral  SpO2: 100% 100% 100% 100%  Weight:      Height:       Supplemental O2: Room Air SpO2: 100 %   Physical Exam:   Constitutional: well-appearing resting in bed comforbtly HENT: normocephalic atraumatic. Cardiovascular: Systolic murmur appreciated, best heard over second right intercostal space. Normal rate and rhythm Pulmonary/Chest: normal work of breathing on room air, lungs clear to auscultation bilaterally Neurological: alert & oriented x 3.  Normal extremity sensation. Weakness of left lower extremity 3/5.  Upper extremities without weakness.  Skin: warm and dry  Filed Weights   02/08/20 0611 02/11/20 0415 02/12/20 0400  Weight: 73.9 kg 70.9 kg 67.3 kg   No intake or output data in the 24 hours ending 02/16/20 0618 Net IO Since Admission: -5,278.89 mL [02/16/20 0618]  Pertinent Labs: CBC Latest Ref Rng & Units 02/15/2020 02/14/2020 02/13/2020  WBC 4.0 - 10.5 K/uL 8.1 8.1 8.2  Hemoglobin 12.0 - 15.0 g/dL 11.3(L) 11.1(L) 10.9(L)  Hematocrit 36.0 - 46.0 % 35.2(L) 33.0(L) 32.4(L)  Platelets 150 - 400 K/uL 170 163 123(L)    CMP Latest Ref Rng & Units 02/15/2020 02/14/2020 02/13/2020  Glucose 70 - 99 mg/dL 157(H) 133(H) 129(H)  BUN 8 - 23 mg/dL 46(H) 41(H) 37(H)   Creatinine 0.44 - 1.00 mg/dL 2.79(H) 2.85(H) 2.91(H)  Sodium 135 - 145 mmol/L 137 136 134(L)  Potassium 3.5 - 5.1 mmol/L 4.1 3.7 3.7  Chloride 98 - 111 mmol/L 105 102 103  CO2 22 - 32 mmol/L 22 22 19(L)  Calcium 8.9 - 10.3 mg/dL 9.1 8.8(L) 8.4(L)  Total Protein 6.5 - 8.1 g/dL - - -  Total Bilirubin 0.3 - 1.2 mg/dL - - -  Alkaline Phos 38 - 126 U/L - - -  AST 15 - 41 U/L - - -  ALT 0 - 44 U/L - - -    Imaging: No results found.  Assessment/Plan:   Active Problems:   Weakness   Acute left-sided weakness   Thrombocytopenia (HCC)   Cerebral embolism with cerebral infarction   Patient Summary: [name] is a 80 y.o. with pertinent PMH of HTN, HLD, T2DM, CKD Stg III/IV, h/o of CVA R. MCA 2015 s/p stent with subsequent occlusion, CVA R. PCA 08/2018, multiple TIA's, CAD RCA stent placement, TAVR for severe AS 11/2018 who presented with weakness and admit for further neurological workup  Weakness Likely 2/2 to Elevated Blood Pressure Acute Punctate Infarct right ACA Cortex s/p TPA Hemorrhagic Conversion Paramedian mid to Posterior Right Frontal Lobe  Hx of CVA and TIA 1/22 MRI Brain w/o C - No acute infarct/hemorrhage 1/25 patient with increased weakness, repeat MRI - acute punctate infarct. Given tPA 1/26 MRI revealed hemorrhagic  conversion, repeat CT at 1800 unchanged small paramedian right frontal lobe hemorrhage. 1/28: Repeat head CT Negative  Patient with persistent LLE weakness, not worsening. No new neurological deficits. Plan for CIR placement this week. Will restart aspirin tomorrow if no new neurological deficits overnight. BP stable on current regimen.  -strict BP parameters - <180/105 -frequent neuro checks -hold all anticoagulation -PT/OT->CIR -continue home medications amlodipine,torsemide, continue to hold labetolol if pt bradycardic -continue imdur -Crestor increased to 40 mg daily -loop recorder per EP -follow up with neuro outpatient in 4  weeks  Transient A.Fib On call team yesterday afternoon documented patient with episode of afib, rate controlled in the 70-80's. Unable to review myself, patient transferred to 3W yesterday. On 3W tele no indication of afib. Patient denied shortness of breath or chest pain yesterday.  EKG from yesterday with PAC's -continue tele monitor  Hx of TAVR 11/2018 HFpEF Last echo 12/23/19 - LVEF 56%, Aortic valve replacement without regurgitation or leak. Mild mitral and tricuspid regurgitation. Do not believe contributing to wide pulse pressures, appears to be chronic.   CKD Stg III/IV Cr of 2.79 Baseline appears to be 2.5-2.9.  -will continue to monitor -avoid nephro toxic medications  CAD Troponemia  Hx of RCA stent placement 12/2013 Troponin trend was flat.   -cardiac monitoring -Hold plavix until hemorrhagic region reabsorbed or near reabsorbation  Hx T2DM A1C 7.7. - SSI during her admission - Lantus 10  Thrombocytopenia - resolved  Diet: Regular Diet (was on renal diet, switched back to regular) VTE: None, being held at this time in setting of hemorrhage IVF: None,None Code: Full  Dispo: Pending CIR placement  Maudie Mercury, MD Internal Medicine Resident PGY-2 Pager [phone] Please contact the on call pager after 5 pm and on weekends at [phone].

## 2020-02-17 ENCOUNTER — Other Ambulatory Visit: Payer: Self-pay

## 2020-02-17 ENCOUNTER — Encounter (HOSPITAL_COMMUNITY): Payer: Self-pay | Admitting: Physical Medicine & Rehabilitation

## 2020-02-17 ENCOUNTER — Inpatient Hospital Stay (HOSPITAL_COMMUNITY)
Admission: RE | Admit: 2020-02-17 | Discharge: 2020-02-26 | DRG: 057 | Disposition: A | Discharge status: Home Health | Payer: Medicare PPO | Source: Intra-hospital | Attending: Physical Medicine & Rehabilitation | Admitting: Physical Medicine & Rehabilitation

## 2020-02-17 ENCOUNTER — Encounter (HOSPITAL_COMMUNITY)
Admission: EM | Disposition: A | Discharge status: Rehabilitation Facility | Payer: Self-pay | Source: Home / Self Care | Attending: Internal Medicine

## 2020-02-17 DIAGNOSIS — D72829 Elevated white blood cell count, unspecified: Secondary | ICD-10-CM | POA: Diagnosis present

## 2020-02-17 DIAGNOSIS — I63421 Cerebral infarction due to embolism of right anterior cerebral artery: Secondary | ICD-10-CM | POA: Diagnosis not present

## 2020-02-17 DIAGNOSIS — E1142 Type 2 diabetes mellitus with diabetic polyneuropathy: Secondary | ICD-10-CM | POA: Diagnosis present

## 2020-02-17 DIAGNOSIS — Z8042 Family history of malignant neoplasm of prostate: Secondary | ICD-10-CM

## 2020-02-17 DIAGNOSIS — Z794 Long term (current) use of insulin: Secondary | ICD-10-CM

## 2020-02-17 DIAGNOSIS — T501X5A Adverse effect of loop [high-ceiling] diuretics, initial encounter: Secondary | ICD-10-CM | POA: Diagnosis present

## 2020-02-17 DIAGNOSIS — I10 Essential (primary) hypertension: Secondary | ICD-10-CM

## 2020-02-17 DIAGNOSIS — D649 Anemia, unspecified: Secondary | ICD-10-CM | POA: Diagnosis present

## 2020-02-17 DIAGNOSIS — I69354 Hemiplegia and hemiparesis following cerebral infarction affecting left non-dominant side: Secondary | ICD-10-CM | POA: Diagnosis present

## 2020-02-17 DIAGNOSIS — E11319 Type 2 diabetes mellitus with unspecified diabetic retinopathy without macular edema: Secondary | ICD-10-CM | POA: Diagnosis present

## 2020-02-17 DIAGNOSIS — N179 Acute kidney failure, unspecified: Secondary | ICD-10-CM | POA: Diagnosis present

## 2020-02-17 DIAGNOSIS — I6623 Occlusion and stenosis of bilateral posterior cerebral arteries: Secondary | ICD-10-CM | POA: Diagnosis present

## 2020-02-17 DIAGNOSIS — R5381 Other malaise: Secondary | ICD-10-CM | POA: Diagnosis present

## 2020-02-17 DIAGNOSIS — Z79899 Other long term (current) drug therapy: Secondary | ICD-10-CM

## 2020-02-17 DIAGNOSIS — I129 Hypertensive chronic kidney disease with stage 1 through stage 4 chronic kidney disease, or unspecified chronic kidney disease: Secondary | ICD-10-CM | POA: Diagnosis present

## 2020-02-17 DIAGNOSIS — D696 Thrombocytopenia, unspecified: Secondary | ICD-10-CM | POA: Diagnosis present

## 2020-02-17 DIAGNOSIS — I4891 Unspecified atrial fibrillation: Secondary | ICD-10-CM | POA: Diagnosis present

## 2020-02-17 DIAGNOSIS — M545 Low back pain, unspecified: Secondary | ICD-10-CM | POA: Diagnosis present

## 2020-02-17 DIAGNOSIS — I2510 Atherosclerotic heart disease of native coronary artery without angina pectoris: Secondary | ICD-10-CM | POA: Diagnosis present

## 2020-02-17 DIAGNOSIS — Z8249 Family history of ischemic heart disease and other diseases of the circulatory system: Secondary | ICD-10-CM

## 2020-02-17 DIAGNOSIS — N184 Chronic kidney disease, stage 4 (severe): Secondary | ICD-10-CM | POA: Diagnosis present

## 2020-02-17 DIAGNOSIS — E1122 Type 2 diabetes mellitus with diabetic chronic kidney disease: Secondary | ICD-10-CM | POA: Diagnosis present

## 2020-02-17 DIAGNOSIS — Z952 Presence of prosthetic heart valve: Secondary | ICD-10-CM

## 2020-02-17 DIAGNOSIS — E785 Hyperlipidemia, unspecified: Secondary | ICD-10-CM | POA: Diagnosis present

## 2020-02-17 DIAGNOSIS — E876 Hypokalemia: Secondary | ICD-10-CM

## 2020-02-17 DIAGNOSIS — I639 Cerebral infarction, unspecified: Secondary | ICD-10-CM | POA: Diagnosis present

## 2020-02-17 DIAGNOSIS — Z833 Family history of diabetes mellitus: Secondary | ICD-10-CM

## 2020-02-17 DIAGNOSIS — N189 Chronic kidney disease, unspecified: Secondary | ICD-10-CM

## 2020-02-17 DIAGNOSIS — H547 Unspecified visual loss: Secondary | ICD-10-CM | POA: Diagnosis present

## 2020-02-17 DIAGNOSIS — M25551 Pain in right hip: Secondary | ICD-10-CM | POA: Diagnosis present

## 2020-02-17 HISTORY — PX: LOOP RECORDER INSERTION: EP1214

## 2020-02-17 LAB — CBC
HCT: 32.1 % — ABNORMAL LOW (ref 36.0–46.0)
Hemoglobin: 10.8 g/dL — ABNORMAL LOW (ref 12.0–15.0)
MCH: 30 pg (ref 26.0–34.0)
MCHC: 33.6 g/dL (ref 30.0–36.0)
MCV: 89.2 fL (ref 80.0–100.0)
Platelets: 169 10*3/uL (ref 150–400)
RBC: 3.6 MIL/uL — ABNORMAL LOW (ref 3.87–5.11)
RDW: 13.2 % (ref 11.5–15.5)
WBC: 11.4 10*3/uL — ABNORMAL HIGH (ref 4.0–10.5)
nRBC: 0 % (ref 0.0–0.2)

## 2020-02-17 LAB — BASIC METABOLIC PANEL
Anion gap: 11 (ref 5–15)
BUN: 41 mg/dL — ABNORMAL HIGH (ref 8–23)
CO2: 21 mmol/L — ABNORMAL LOW (ref 22–32)
Calcium: 9.3 mg/dL (ref 8.9–10.3)
Chloride: 103 mmol/L (ref 98–111)
Creatinine, Ser: 2.6 mg/dL — ABNORMAL HIGH (ref 0.44–1.00)
GFR, Estimated: 18 mL/min — ABNORMAL LOW (ref >60)
Glucose, Bld: 122 mg/dL — ABNORMAL HIGH (ref 70–99)
Potassium: 3.7 mmol/L (ref 3.5–5.1)
Sodium: 135 mmol/L (ref 135–145)

## 2020-02-17 LAB — GLUCOSE, CAPILLARY
Glucose-Capillary: 125 mg/dL — ABNORMAL HIGH (ref 70–99)
Glucose-Capillary: 123 mg/dL — ABNORMAL HIGH (ref 70–99)
Glucose-Capillary: 122 mg/dL — ABNORMAL HIGH (ref 70–99)
Glucose-Capillary: 160 mg/dL — ABNORMAL HIGH (ref 70–99)
Glucose-Capillary: 70 mg/dL (ref 70–99)
Glucose-Capillary: 299 mg/dL — ABNORMAL HIGH (ref 70–99)

## 2020-02-17 SURGERY — LOOP RECORDER INSERTION

## 2020-02-17 MED ORDER — AMLODIPINE BESYLATE 10 MG PO TABS
10.0000 mg | ORAL_TABLET | Freq: Every day | ORAL | Status: DC
  Administered 2020-02-18 – 2020-02-26 (×9): 10 mg via ORAL
  Filled 2020-02-17 (×9): qty 1

## 2020-02-17 MED ORDER — STROKE: EARLY STAGES OF RECOVERY BOOK
Freq: Once | Status: AC
  Filled 2020-02-17: qty 1

## 2020-02-17 MED ORDER — INSULIN GLARGINE 100 UNIT/ML ~~LOC~~ SOLN: 10.0000 [IU] | Freq: Every day | SUBCUTANEOUS | 11 refills | Status: DC

## 2020-02-17 MED ORDER — POLYETHYLENE GLYCOL 3350 17 G PO PACK
17.0000 g | PACK | Freq: Every day | ORAL | Status: DC | PRN
  Administered 2020-02-21: 17 g via ORAL
  Filled 2020-02-17: qty 1

## 2020-02-17 MED ORDER — ISOSORBIDE MONONITRATE ER 30 MG PO TB24: 30.0000 mg | ORAL_TABLET | Freq: Every day | ORAL | Status: DC

## 2020-02-17 MED ORDER — VENLAFAXINE HCL ER 75 MG PO CP24
150.0000 mg | ORAL_CAPSULE | Freq: Every day | ORAL | Status: DC
  Administered 2020-02-18 – 2020-02-26 (×9): 150 mg via ORAL
  Filled 2020-02-17 (×9): qty 2

## 2020-02-17 MED ORDER — ROSUVASTATIN CALCIUM 40 MG PO TABS: 40.0000 mg | ORAL_TABLET | Freq: Every day | ORAL | Status: DC

## 2020-02-17 MED ORDER — ALUM & MAG HYDROXIDE-SIMETH 200-200-20 MG/5ML PO SUSP: 30.0000 mL | ORAL | Status: DC | PRN

## 2020-02-17 MED ORDER — POLYETHYLENE GLYCOL 3350 17 G PO PACK
17.0000 g | PACK | Freq: Every day | ORAL | Status: DC
  Administered 2020-02-18 – 2020-02-26 (×6): 17 g via ORAL
  Filled 2020-02-17 (×8): qty 1

## 2020-02-17 MED ORDER — PROCHLORPERAZINE MALEATE 5 MG PO TABS: 5.0000 mg | ORAL_TABLET | Freq: Four times a day (QID) | ORAL | Status: DC | PRN

## 2020-02-17 MED ORDER — ASPIRIN EC 81 MG PO TBEC: 81.0000 mg | DELAYED_RELEASE_TABLET | Freq: Every day | ORAL | 2 refills | Status: AC

## 2020-02-17 MED ORDER — SENNOSIDES-DOCUSATE SODIUM 8.6-50 MG PO TABS
2.0000 | ORAL_TABLET | Freq: Two times a day (BID) | ORAL | Status: DC
  Administered 2020-02-17 – 2020-02-26 (×18): 2 via ORAL
  Filled 2020-02-17 (×18): qty 2

## 2020-02-17 MED ORDER — GUAIFENESIN-DM 100-10 MG/5ML PO SYRP: 5.0000 mL | ORAL_SOLUTION | Freq: Four times a day (QID) | ORAL | Status: DC | PRN

## 2020-02-17 MED ORDER — INSULIN GLARGINE 100 UNIT/ML ~~LOC~~ SOLN
10.0000 [IU] | Freq: Every day | SUBCUTANEOUS | Status: DC
  Administered 2020-02-17 – 2020-02-23 (×7): 10 [IU] via SUBCUTANEOUS
  Filled 2020-02-17 (×8): qty 0.1

## 2020-02-17 MED ORDER — HYDRALAZINE HCL 50 MG PO TABS: 50.0000 mg | ORAL_TABLET | Freq: Three times a day (TID) | ORAL | Status: DC

## 2020-02-17 MED ORDER — ISOSORBIDE MONONITRATE ER 30 MG PO TB24
30.0000 mg | ORAL_TABLET | Freq: Every day | ORAL | Status: DC
  Administered 2020-02-18 – 2020-02-26 (×9): 30 mg via ORAL
  Filled 2020-02-17 (×10): qty 1

## 2020-02-17 MED ORDER — DIPHENHYDRAMINE HCL 12.5 MG/5ML PO ELIX: 12.5000 mg | ORAL_SOLUTION | Freq: Four times a day (QID) | ORAL | Status: DC | PRN

## 2020-02-17 MED ORDER — LIDOCAINE-EPINEPHRINE 1 %-1:100000 IJ SOLN
INTRAMUSCULAR | Status: AC
  Filled 2020-02-17: qty 1

## 2020-02-17 MED ORDER — INSULIN ASPART 100 UNIT/ML ~~LOC~~ SOLN
0.0000 [IU] | Freq: Every day | SUBCUTANEOUS | Status: DC
  Administered 2020-02-17 – 2020-02-23 (×2): 3 [IU] via SUBCUTANEOUS

## 2020-02-17 MED ORDER — LABETALOL HCL 100 MG PO TABS
200.0000 mg | ORAL_TABLET | Freq: Two times a day (BID) | ORAL | Status: DC
  Administered 2020-02-17 – 2020-02-26 (×17): 200 mg via ORAL
  Filled 2020-02-17 (×18): qty 2

## 2020-02-17 MED ORDER — TORSEMIDE 20 MG PO TABS: 40.0000 mg | ORAL_TABLET | Freq: Every day | ORAL | Status: DC

## 2020-02-17 MED ORDER — INSULIN ASPART 100 UNIT/ML ~~LOC~~ SOLN
0.0000 [IU] | Freq: Three times a day (TID) | SUBCUTANEOUS | Status: DC
  Administered 2020-02-18: 3 [IU] via SUBCUTANEOUS
  Administered 2020-02-18 – 2020-02-19 (×2): 2 [IU] via SUBCUTANEOUS
  Administered 2020-02-19 – 2020-02-20 (×3): 1 [IU] via SUBCUTANEOUS
  Administered 2020-02-21: 2 [IU] via SUBCUTANEOUS
  Administered 2020-02-22: 3 [IU] via SUBCUTANEOUS
  Administered 2020-02-23: 2 [IU] via SUBCUTANEOUS
  Administered 2020-02-23 – 2020-02-24 (×2): 1 [IU] via SUBCUTANEOUS
  Administered 2020-02-25 (×2): 2 [IU] via SUBCUTANEOUS
  Administered 2020-02-26: 1 [IU] via SUBCUTANEOUS

## 2020-02-17 MED ORDER — TRAZODONE HCL 50 MG PO TABS: 25.0000 mg | ORAL_TABLET | Freq: Every evening | ORAL | Status: DC | PRN

## 2020-02-17 MED ORDER — PROCHLORPERAZINE EDISYLATE 10 MG/2ML IJ SOLN: 5.0000 mg | Freq: Four times a day (QID) | INTRAMUSCULAR | Status: DC | PRN

## 2020-02-17 MED ORDER — ASPIRIN EC 81 MG PO TBEC
81.0000 mg | DELAYED_RELEASE_TABLET | Freq: Every day | ORAL | Status: DC
  Administered 2020-02-17 – 2020-02-26 (×10): 81 mg via ORAL
  Filled 2020-02-17 (×11): qty 1

## 2020-02-17 MED ORDER — FLEET ENEMA 7-19 GM/118ML RE ENEM: 1.0000 | ENEMA | Freq: Once | RECTAL | Status: DC | PRN

## 2020-02-17 MED ORDER — LIDOCAINE-EPINEPHRINE 1 %-1:100000 IJ SOLN
INTRAMUSCULAR | Status: DC | PRN
  Administered 2020-02-17: 20 mL

## 2020-02-17 MED ORDER — ROSUVASTATIN CALCIUM 20 MG PO TABS
40.0000 mg | ORAL_TABLET | Freq: Every day | ORAL | Status: DC
  Administered 2020-02-18 – 2020-02-26 (×9): 40 mg via ORAL
  Filled 2020-02-17 (×10): qty 2

## 2020-02-17 MED ORDER — ACETAMINOPHEN 325 MG PO TABS: 325.0000 mg | ORAL_TABLET | ORAL | Status: DC | PRN

## 2020-02-17 MED ORDER — HYDRALAZINE HCL 50 MG PO TABS
50.0000 mg | ORAL_TABLET | Freq: Three times a day (TID) | ORAL | Status: DC
  Administered 2020-02-17 – 2020-02-26 (×26): 50 mg via ORAL
  Filled 2020-02-17 (×26): qty 1

## 2020-02-17 MED ORDER — PROCHLORPERAZINE 25 MG RE SUPP: 12.5000 mg | Freq: Four times a day (QID) | RECTAL | Status: DC | PRN

## 2020-02-17 MED ORDER — TORSEMIDE 20 MG PO TABS
40.0000 mg | ORAL_TABLET | Freq: Every day | ORAL | Status: DC
  Administered 2020-02-18 – 2020-02-26 (×9): 40 mg via ORAL
  Filled 2020-02-17 (×10): qty 2

## 2020-02-17 MED ORDER — LABETALOL HCL 200 MG PO TABS: 200.0000 mg | ORAL_TABLET | Freq: Two times a day (BID) | ORAL | Status: DC

## 2020-02-17 MED ORDER — BISACODYL 10 MG RE SUPP: 10.0000 mg | Freq: Every day | RECTAL | Status: DC | PRN

## 2020-02-17 SURGICAL SUPPLY — 2 items
MONITOR REVEAL LINQ II (Prosthesis & Implant Heart) ×2 IMPLANT
PACK LOOP INSERTION (CUSTOM PROCEDURE TRAY) ×2 IMPLANT

## 2020-02-17 NOTE — Progress Notes (Signed)
Inpatient Rehab Admissions Coordinator:   I have a bed available for pt to admit to CIR today, insurance approved.  [facility] in agreement and plans for loop recorder placement this afternoon if pt consents.  Will let pt/family and TOC team know.    Shann Medal, PT, DPT Admissions Coordinator [phone] 02/17/20  10:17 AM

## 2020-02-17 NOTE — Progress Notes (Signed)
PT Cancellation Note  Patient Details Name: [name] MRN: [id number] DOB: 1940-08-24   Cancelled Treatment:    Reason Eval/Treat Not Completed: Patient at procedure or test/unavailable; in cath lab for loop recorder insertion, then plans for CIR.  Will follow up if not d/c.   Reginia Naas 02/17/2020, 3:42 PM  Magda Kiel, PT Acute Rehabilitation Services [phone] [phone] 02/17/2020

## 2020-02-17 NOTE — Discharge Summary (Addendum)
Name: [name] MRN: [id number] DOB: [birth year] 80 y.o. PCP: Audley Hose, MD  Date of Admission: 02/08/2020  6:01 AM Date of Discharge:  Attending Physician: Dr. Angelia Mould  Discharge Diagnosis: Active Problems:   Weakness   Acute left-sided weakness   Thrombocytopenia ([place])   Cerebral embolism with cerebral infarction    Discharge Medications: Allergies as of 02/17/2020   No Known Allergies     Medication List    STOP taking these medications   clopidogrel 75 MG tablet Commonly known as: PLAVIX     TAKE these medications   acetaminophen 325 MG tablet Commonly known as: TYLENOL Take 650 mg by mouth every 6 (six) hours as needed for moderate pain or headache.   allopurinol 100 MG tablet Commonly known as: ZYLOPRIM Take 50 mg by mouth daily.   amLODipine 10 MG tablet Commonly known as: NORVASC TAKE 1 TABLET BY MOUTH EVERY DAY   amoxicillin 500 MG tablet Commonly known as: AMOXIL Take 4 tablets (2,000 mg total) by mouth as directed. 30-60 min before dental procedure   aspirin EC 81 MG tablet Take 1 tablet (81 mg total) by mouth daily. Swallow whole.   B-D INS SYR ULTRAFINE 1CC/30G 30G X 1/2" 1 ML Misc Generic drug: Insulin Syringe-Needle U-100 Inject 1 each into the skin 3 (three) times daily between meals.   BiDil 20-37.5 MG tablet Generic drug: isosorbide-hydrALAZINE Take 2 tablets by mouth 3 (three) times daily.   brimonidine 0.2 % ophthalmic solution Commonly known as: ALPHAGAN   calcitRIOL 0.25 MCG capsule Commonly known as: ROCALTROL Take 0.25 mcg by mouth daily.   cetirizine 10 MG tablet Commonly known as: ZYRTEC Take 10 mg by mouth daily as needed for allergies.   Desvenlafaxine ER 100 MG Tb24 Take 100 mg by mouth daily.   folic acid 1 MG tablet Commonly known as: FOLVITE Take 1 mg by mouth daily.   HumaLOG KwikPen 100 UNIT/ML KwikPen Generic drug: insulin lispro Inject 2-8 Units into the skin 3 (three) times daily before  meals.   insulin glargine 100 UNIT/ML injection Commonly known as: LANTUS Inject 0.1 mLs (10 Units total) into the skin at bedtime. What changed: how much to take   labetalol 200 MG tablet Commonly known as: NORMODYNE Take 1 tablet (200 mg total) by mouth 2 (two) times daily. What changed:   medication strength  how much to take   leflunomide 10 MG tablet Commonly known as: ARAVA Take 10 mg by mouth daily.   niacin 500 MG CR tablet Commonly known as: NIASPAN Take 500 mg by mouth daily.   nitroGLYCERIN 0.4 MG SL tablet Commonly known as: NITROSTAT PLACE 1 TABLET (0.4 MG TOTAL) UNDER THE TONGUE EVERY 5 MINUTES X 3 DOSES AS NEEDED FOR CHEST PAIN. What changed: See the new instructions.   nortriptyline 10 MG capsule Commonly known as: PAMELOR Take 10 mg by mouth daily.   ONE TOUCH ULTRA TEST test strip Generic drug: glucose blood 1 each by Other route 3 (three) times daily between meals.   polyvinyl alcohol 1.4 % ophthalmic solution Commonly known as: LIQUIFILM TEARS Place 1 drop into both eyes 3 (three) times daily as needed for dry eyes.   rosuvastatin 40 MG tablet Commonly known as: CRESTOR Take 1 tablet (40 mg total) by mouth daily. Start taking on: February 18, 2020 What changed:   medication strength  how much to take   torsemide 20 MG tablet Commonly known as: DEMADEX Take 2 tablets (40  mg total) by mouth daily. Start taking on: February 18, 2020 What changed:   how much to take  additional instructions       Disposition and follow-up:   Ms.[name] was discharged from [facility] in Good condition.  At the hospital follow up visit please address:  1.  Follow-up:  A. Weakness/Neurological Deficit    B. Hyperlipidemia   C. Hypertension   D. Loop Recorder  2.  Labs / imaging needed at time of follow-up: Repeat LDL in 6-8 wks, sleep study  3.  Pending labs/ test needing follow-up: None  4.  Medication  Changes  Started: Crestor 40 mg, Asprin 81 mg  Stopped: Crestor 20 mg, Plavix  Follow-up Appointments:  Follow-up Information    Kathrynn Ducking, MD. Schedule an appointment as soon as possible for a visit in 4 week(s).   Specialty: Neurology Contact information: [address] [place] [address] [phone]        Audley Hose, MD Follow up.   Specialty: Internal Medicine Contact information: [place] [address] [phone]        [place] [facility] Follow up.   Specialty: Cardiology Why: 03/03/20 @ 2:40PM, wound check visit (heart monitor) Contact information: [address], [place] Hospital Course by problem list:  Acute CVA Weakness Patient presented with lower extremity weakness, worse on the left and left upper extremity weakness. Physical exam revealed significant weakness of left lower extremity as well as weakness of the left upper extremity. Initial MRI head w/o contrast negative for any acute findings. The weakness on admission and this was thought to be secondary to her symptomatic severe hypertension vs TIA. Blood pressures and weakness were improving however, on hospital day 3, she developed acute worsening of her weakness, code stroke called by RN. MRI with evidence of 89m area of acute infarct in right frontal parietal cortex. tPA was ordered at this time. Imaging was repeated and showed evidence of small hemorrhages, aspirin and plavix were held. Patient had episode of worsening weakness of her left lower extremity, repeat CT was performed and did not reveal worsening of her hemorrhage, but some mild edema. The had no further neurological changes and was restarted on her 81 mg aspirin at discharge. Her LDL and total cholesterol levels were elevated despite 20 mg crestor that she notes adherence to. This was increased to 40  mg crestor daily. Neurology also recommended adding a loop monitor for possible cardio-embolic etiology of patient's stroke. This was added prior to discharge. [facility]  Persistent Hypertension Patient presented with severe symptomatic hypertension. She endorsed medication adherence to her amlodipine and labetalol. Initially she was started on a nitro drip. She was restarted on her home torsemide and labetalol, and started on imdur. The nitro drip was tapered off and she was started on hydralazine. Her blood pressure was overall difficult to control, but this regimen led to overall improvement. I do suspect her untreated sleep apnea may be contributing to her persistent hypertension. Would recommend she have a sleep study performed on an outpatient basis, she noted having one in the past but did not like the CPAP machine at that time. At discharge, she was continued on her amlodipine 10 mg daily, labetalol 200 mg BID,  and continued on bidil 20-37.5  Discharge Subjective: During evaluation at bedside this morning, patient reports she is doing well except for overnight had a brief episode of generalized weakness and feeling hot. States she wonders if it is due to not getting Pristiq but rather an equivalent medication. States she is looking forward to going to [facility] today.   Discharge Exam:   BP (!) 135/47 (BP Location: Right Arm)   Pulse 62   Temp 98.6 F (37 C) (Oral)   Resp 18   Ht '5\' 3"'$  (1.6 m)   Wt 67.3 kg   SpO2 98%   BMI 26.28 kg/m  Discharge exam: Constitutional: well-appearing resting in bed comforbtly HENT: normocephalic atraumatic. Cardiovascular: Normal rate and rhythm Pulmonary/Chest: normal work of breathing on room air Neurological: alert &oriented x 3.  Skin: warm and dry  Pertinent Labs, Studies, and Procedures:  CBC Latest Ref Rng & Units 02/17/2020 02/15/2020 02/14/2020  WBC 4.0 - 10.5 K/uL 11.4(H) 8.1 8.1  Hemoglobin  12.0 - 15.0 g/dL 10.8(L) 11.3(L) 11.1(L)  Hematocrit 36.0 - 46.0 % 32.1(L) 35.2(L) 33.0(L)  Platelets 150 - 400 K/uL 169 170 163    CMP Latest Ref Rng & Units 02/17/2020 02/15/2020 02/14/2020  Glucose 70 - 99 mg/dL 122(H) 157(H) 133(H)  BUN 8 - 23 mg/dL 41(H) 46(H) 41(H)  Creatinine 0.44 - 1.00 mg/dL 2.60(H) 2.79(H) 2.85(H)  Sodium 135 - 145 mmol/L 135 137 136  Potassium 3.5 - 5.1 mmol/L 3.7 4.1 3.7  Chloride 98 - 111 mmol/L 103 105 102  CO2 22 - 32 mmol/L 21(L) 22 22  Calcium 8.9 - 10.3 mg/dL 9.3 9.1 8.8(L)  Total Protein 6.5 - 8.1 g/dL - - -  Total Bilirubin 0.3 - 1.2 mg/dL - - -  Alkaline Phos 38 - 126 U/L - - -  AST 15 - 41 U/L - - -  ALT 0 - 44 U/L - - -    CT HEAD WO CONTRAST  Result Date: 02/08/2020 CLINICAL DATA:  History of prior strokes, acute left hand paresthesias, heavy legs EXAM: CT HEAD WITHOUT CONTRAST TECHNIQUE: Contiguous axial images were obtained from the base of the skull through the vertex without intravenous contrast. COMPARISON:  09/09/2018 FINDINGS: Brain: Chronic encephalomalacia in the central right frontal lobe and the right occipital lobe from remote infarcts. No acute intracranial hemorrhage, area of new infarction, mass lesion, midline shift, herniation or hydrocephalus. No extra-axial fluid collection. No focal mass effect or edema. cisterns are patent. No cerebellar abnormality. Vascular: Intracranial atherosclerosis at the skull base. Chronic right MCA M1 vascular stent again no. Skull: Normal. Negative for fracture or focal lesion. Sinuses/Orbits: No acute finding. Other: None. IMPRESSION: Remote right frontal and right occipital infarcts with encephalomalacia. No acute intracranial finding by noncontrast CT. Electronically Signed   By: Jerilynn Mages.  Shick [name]   On: 02/08/2020 06:53   MR BRAIN WO CONTRAST  Result Date: 02/08/2020 CLINICAL DATA:  Tingling and numbness in the left hand. EXAM: MRI HEAD WITHOUT CONTRAST TECHNIQUE: Multiplanar, multiecho pulse sequences  of the brain and surrounding structures were obtained without intravenous contrast. COMPARISON:  09/10/2018 FINDINGS: Brain: Moderate remote infarcts in the right superior frontal and occipital lobes. No acute infarct, hemorrhage, hydrocephalus, or masslike finding. Chronic blood products associated with the remote infarcts and the superior left frontal cortex. Vascular: Normal flow voids.  There is a right MCA stent present. Skull and upper cervical spine: Normal marrow signal Sinuses/Orbits: Bilateral cataract resection. Mucosal thickening in the paranasal sinuses. IMPRESSION: 1.  No acute finding. 2. Remote right cerebral infarcts. Electronically Signed   By: Monte Fantasia [name]   On: 02/08/2020 09:26     Discharge Instructions: Discharge Instructions    Ambulatory referral to Neurology   Complete by: As directed    Follow up with Dr. Jannifer Franklin at [facility] in 4 weeks.  Patient is Dr. Jannifer Franklin patient. Thanks.   Call MD for:  extreme fatigue   Complete by: As directed    Call MD for:  persistant dizziness or light-headedness   Complete by: As directed    Call MD for:  persistant nausea and vomiting   Complete by: As directed    Call MD for:  redness, tenderness, or signs of infection (pain, swelling, redness, odor or green/yellow discharge around incision site)   Complete by: As directed    Call MD for:  severe uncontrolled pain   Complete by: As directed    Call MD for:  temperature >100.4   Complete by: As directed    Diet - low sodium heart healthy   Complete by: As directed    Increase activity slowly   Complete by: As directed       Signed: Riesa Pope, MD 02/17/2020, 4:18 PM   Pager: [phone]

## 2020-02-17 NOTE — Plan of Care (Signed)
  Problem: Coping: Goal: Level of anxiety will decrease Outcome: Progressing   Problem: Safety: Goal: Ability to remain free from injury will improve Outcome: Progressing   Problem: Education: Goal: Knowledge of disease or condition will improve Outcome: Progressing Goal: Knowledge of secondary prevention will improve Outcome: Progressing Goal: Knowledge of patient specific risk factors addressed and post discharge goals established will improve Outcome: Progressing Goal: Individualized Educational Video(s) Outcome: Progressing   Problem: Ischemic Stroke/TIA Tissue Perfusion: Goal: Complications of ischemic stroke/TIA will be minimized Outcome: Progressing

## 2020-02-17 NOTE — Progress Notes (Signed)
PMR Admission Coordinator Pre-Admission Assessment   Patient: [name] is an 80 y.o., female MRN: [id number] DOB: July 22, 1940 Height: 5' 3"  (160 cm) Weight: 67.3 kg                                                                                                                                                  Insurance Information HMO:     PPO: yes     PCP:      IPA:      80/20:      OTHER:  PRIMARY: [employer]      Policy#: [id number]      Subscriber: pt CM Name: Lattie Haw      Phone#: [phone] ext [phone]     Fax#: [phone] Pre-Cert#: [id number] auth for CIR provided by Lattie Haw at [facility] with updates due to Lestine Box at fax listed above on 2/7 (phone [phone] ext [phone])      Employer: n/a Benefits:  Phone #: [phone]     Name:  Eff. Date: 01/18/20     Deduct: $0      Out of Pocket Max: $4000 ($0 met)      Life Max: n/a  CIR: $160/day for days 1-10      SNF: 20 full days Outpatient:      Co-Pay: $20/visit Home Health: 100%      Co-Pay:  DME: 80%     Co-Ins: 20% Providers: preferred network  SECONDARY:       Policy#:       Phone#:    Development worker, community:       Phone#:    The Engineer, petroleum" for patients in Inpatient Rehabilitation Facilities with attached "Privacy Act [place] Records" was provided and verbally reviewed with: Patient and Family   Emergency Contact Information         Contact Information     Name Relation Home Work [place] Other     [phone]       Current Medical History  Patient Admitting Diagnosis: CVA    History of Present Illness: [name] is a 80 y.o. right-handed female with history of diabetes mellitus, CKD followed by Dr. Hollie Salk, hyperlipidemia, history of CVA x2 without residual weakness including right MCA stenting, obesity with BMI 26.28, CAD with TVAR procedure.  Per chart review patient lives alone independent prior to admission.  Presented 02/08/2020 with headache  transient left arm weakness and numbness.  Cranial CT scan negative for acute changes.  Patient did not receive TPA.  MRI showed interval development of 2 foci of acute parenchymal hemorrhage within the paramedian mid to posterior right frontal lobe largest measuring 1.2 cm with mild surrounding vasogenic edema.  2 unchanged 4 mm acute early subacute infarct within the posterior right frontal lobe.  Admission chemistries BUN 27, creatinine 2.49,  hemoglobin A1c 7.7.  Neurology follow-up close monitoring of blood pressure and awaiting plan for loop recorder placement and awaiting plan for loop recorder placement.  Therapy evaluations completed with recommendations of CIR.    Complete NIHSS TOTAL: 0 Glasgow Coma Scale Score: 15   Past Medical History      Past Medical History:  Diagnosis Date  . Arthritis    . Cerebrovascular disease    . CKD (chronic kidney disease)      Sees Dr Hollie Salk  . Coronary artery disease    . Depression    . Diabetes ([place])      INSULIN DEPENDENT  . Diabetic peripheral neuropathy ([place])    . Diabetic retinopathy ([place])    . Diverticulitis    . GERD (gastroesophageal reflux disease)    . History of CVA (cerebrovascular accident)      x 2 no residulal  . Hyperlipidemia    . Hypertension    . Obesity    . Renal lesion    . S/P TAVR (transcatheter aortic valve replacement) 11/13/2018    s/p TAVR with a 40m Edwards S3U via the TF approach by Dr. ORoxy Mannsand Dr. MAngelena Form . Severe aortic stenosis        Family History  family history includes Diabetes in her mother; Heart disease in her mother; Hypertension in her brother; Prostate cancer in her brother and father.   Prior Rehab/Hospitalizations:  Has the patient had prior rehab or hospitalizations prior to admission? No   Has the patient had major surgery during 100 days prior to admission? No   Current Medications    Current Facility-Administered Medications:  .  0.9 %  sodium chloride infusion, , Intravenous,  Continuous, Bhagat, Srishti L, MD, Stopped at 02/11/20 1121 .  acetaminophen (TYLENOL) tablet 650 mg, 650 mg, Oral, Q6H PRN, 650 mg at 02/12/20 1919 **OR** acetaminophen (TYLENOL) suppository 650 mg, 650 mg, Rectal, Q6H PRN, WMaudie Mercury MD .  amLODipine (NORVASC) tablet 10 mg, 10 mg, Oral, Daily, Katsadouros, Vasilios, MD, 10 mg at 02/14/20 03710.  Chlorhexidine Gluconate Cloth 2 % PADS 6 each, 6 each, Topical, Daily, HLucious Groves DO, 6 each at 02/13/20 1600 .  hydrALAZINE (APRESOLINE) injection 5 mg, 5 mg, Intravenous, Q4H PRN, Bailey-Modzik, Delila A, NP .  hydrALAZINE (APRESOLINE) tablet 50 mg, 50 mg, Oral, Q8H, Hoffman, Erik C, DO, 50 mg at 02/14/20 0603 .  insulin aspart (novoLOG) injection 0-15 Units, 0-15 Units, Subcutaneous, TID WC, WMaudie Mercury MD, 2 Units at 02/14/20 [phone].  insulin glargine (LANTUS) injection 10 Units, 10 Units, Subcutaneous, QHS, WMaudie Mercury MD, 10 Units at 02/13/20 2103 .  isosorbide mononitrate (IMDUR) 24 hr tablet 30 mg, 30 mg, Oral, Daily, Hoffman, Erik C, DO, 30 mg at 02/14/20 04854.  labetalol (NORMODYNE) tablet 200 mg, 200 mg, Oral, BID, Katsadouros, Vasilios, MD, 200 mg at 02/14/20 0832 .  LORazepam (ATIVAN) injection 0.5 mg, 0.5 mg, Intravenous, Once, Bhagat, Srishti L, MD .  polyethylene glycol (MIRALAX / GLYCOLAX) packet 17 g, 17 g, Oral, Daily, WAlexandria Lodge MD, 17 g at 02/14/20 1014 .  rosuvastatin (CRESTOR) tablet 40 mg, 40 mg, Oral, Daily, XRosalin Hawking MD, 40 mg at 02/14/20 06270.  senna-docusate (Senokot-S) tablet 1 tablet, 1 tablet, Oral, BID, WAlexandria Lodge MD, 1 tablet at 02/14/20 1014 .  torsemide (DEMADEX) tablet 40 mg, 40 mg, Oral, Daily, HLucious Groves DO, 40 mg at 02/14/20 03500.  venlafaxine XR (EFFEXOR-XR) 24 hr capsule 150  mg, 150 mg, Oral, Daily, Lucious Groves, DO, 150 mg at 02/14/20 4742   Patients Current Diet:     Diet Order                      Diet renal/carb modified with fluid restriction Diet-HS Snack?  Nothing; Fluid restriction: 1200 mL Fluid; Room service appropriate? Yes; Fluid consistency: Thin  Diet effective now                      Precautions / Restrictions Precautions Precautions: Fall Restrictions Weight Bearing Restrictions: No    Has the patient had 2 or more falls or a fall with injury in the past year?No   Prior Activity Level Community (5-7x/wk): full independent prior to admission   Prior Functional Level Prior Function Level of Independence: Independent   Self Care: Did the patient need help bathing, dressing, using the toilet or eating?  Independent   Indoor Mobility: Did the patient need assistance with walking from room to room (with or without device)? Independent   Stairs: Did the patient need assistance with internal or external stairs (with or without device)? Independent   Functional Cognition: Did the patient need help planning regular tasks such as shopping or remembering to take medications? Independent   Home Assistive Devices / Equipment Home Assistive Devices/Equipment: None Home Equipment: Shower seat,Cane - single point   Prior Device Use: Indicate devices/aids used by the patient prior to current illness, exacerbation or injury? None of the above   Current Functional Level Cognition   Overall Cognitive Status: Impaired/Different from baseline Orientation Level: Oriented X4 General Comments: pt received ativan earlier for scheduled MRI    Extremity Assessment (includes Sensation/Coordination)   Upper Extremity Assessment: Generalized weakness  Lower Extremity Assessment: Defer to PT evaluation     ADLs   Overall ADL's : Needs assistance/impaired Eating/Feeding: Set up,Sitting Grooming: Set up,Sitting Upper Body Dressing : Minimal assistance,Sitting Lower Body Dressing: Moderate assistance,Sitting/lateral leans Functional mobility during ADLs: Minimal assistance,Rolling walker General ADL Comments: multiple LOB backwards      Mobility   Overal bed mobility: Needs Assistance Bed Mobility: Supine to Sit Supine to sit: Supervision Sit to supine: Supervision General bed mobility comments: pt up in chair upon PT arrival     Transfers   Overall transfer level: Needs assistance Equipment used: Rolling walker (2 wheeled) Transfers: Sit to/from [employer] Sit to Stand: Min guard Stand pivot transfers: Min guard General transfer comment: better balance noted, continues with difficulty L leg weakness     Ambulation / Gait / Stairs / Wheelchair Mobility   Ambulation/Gait Ambulation/Gait assistance: Counsellor (Feet): 5 Feet Assistive device: 1 person hand held assist Gait Pattern/deviations: Step-through pattern,Decreased stride length General Gait Details: attempted to march in place in RW however pt with strong L lateral lean, with tactile and verbal cues able to correct but then became nauseated and was very sleepy Gait velocity: Decreased     Posture / Balance Dynamic Sitting Balance Sitting balance - Comments: lateral lean L Balance Overall balance assessment: Needs assistance Sitting-balance support: Feet supported Sitting balance-Leahy Scale: Fair Sitting balance - Comments: lateral lean L Standing balance support: During functional activity,Bilateral upper extremity supported Standing balance-Leahy Scale: Poor Standing balance comment: needs external support     Special needs/care consideration Diabetic management yes        Previous Home Environment (from acute therapy documentation) Living Arrangements: Alone Available Help at Discharge:  Family,Available 24 hours/day Type of Home: House Home Layout: Two level Alternate Level Stairs-Rails: Left,Right Alternate Level Stairs-Number of Steps: flight Home Access: Stairs to enter Entrance Stairs-Rails: None Entrance Stairs-Number of Steps: 2 Bathroom Shower/Tub: Multimedia programmer: Standard Home Care  Services: No   Discharge Living Setting Plans for Discharge Living Setting: Lives with (comment),Other (Comment) (brother) Type of Home at Discharge: House Discharge Home Layout: Two level Alternate Level Stairs-Rails: Left,Right Alternate Level Stairs-Number of Steps: flight Discharge Home Access: Stairs to enter Entrance Stairs-Rails: None Entrance Stairs-Number of Steps: 2 Discharge Bathroom Shower/Tub: Horticulturist, commercial: Standard Discharge Bathroom Accessibility: Yes How Accessible: Accessible via walker Does the patient have any problems obtaining your medications?: No   Social/Family/Support Systems Patient Roles: Parent Contact Information: 2 adult children (youngest is in college) Anticipated Caregiver: brother, Lorre Nick Anticipated Caregiver's Contact Information: [phone] Ability/Limitations of Caregiver: none Caregiver Availability: 24/7 Discharge Plan Discussed with Primary Caregiver: Yes Is Caregiver In Agreement with Plan?: Yes Does Caregiver/Family have Issues with Lodging/Transportation while Pt is in Rehab?: No     Goals Patient/Family Goal for Rehab: PT/OT/SLP supervision to mod I Expected length of stay: 14-16 days Pt/Family Agrees to Admission and willing to participate: Yes Program Orientation Provided & Reviewed with Pt/Caregiver Including Roles  & Responsibilities: Yes  Barriers to Discharge: Insurance for SNF coverage     Decrease burden of Care through IP rehab admission: n/a     Possible need for SNF placement upon discharge:Not anticipated.  Pt with supportive family and plans to d/c home with her brother until she fully recovers.      Patient Condition: This patient's medical and functional status has changed since the consult dated: 02/13/2020 in which the Rehabilitation Physician determined and documented that the patient's condition is appropriate for intensive rehabilitative care in an inpatient rehabilitation  facility. See "History of Present Illness" (above) for medical update. Functional changes are: pt min to mod assist with transfers. Patient's medical and functional status update has been discussed with the Rehabilitation physician and patient remains appropriate for inpatient rehabilitation. Will admit to inpatient rehab today.   Preadmission Screen Completed By:  Michel Santee, PT, DPT 02/14/2020 12:26 PM ______________________________________________________________________   Discussed status with Dr. Ranell Patrick on 02/17/20 at 10:23 AM and received approval for admission today.   Admission Coordinator:  Michel Santee, PT, DPT time 10:24 AM Sudie Grumbling 02/17/20          Cosigned by: Izora Ribas, MD at 02/17/2020 10:25 AM

## 2020-02-17 NOTE — Discharge Instructions (Signed)
Loop monitor Implant site/wound care instructions Keep incision clean and dry for 3 days. You can remove outer dressing tomorrow. Leave steri-strips (little pieces of tape) on until seen in the office for wound check appointment. Call the office [phone]) for redness, drainage, swelling, or fever.

## 2020-02-17 NOTE — TOC Transition Note (Signed)
Transition of Care [facility]) - CM/SW Discharge Note   Patient Details  Name: [name] MRN: [id number] Date of Birth: 09/24/1940  [facility] CM/SW Contact:  Pollie Friar, RN Phone Number: 02/17/2020, 4:18 PM   Clinical Narrative:    Pt is discharging to CIR today. CM signing off.   Final next level of care: IP Rehab Facility Barriers to Discharge: No Barriers Identified   Patient Goals and CMS Choice     Choice offered to / list presented to : Patient  Discharge Placement                       Discharge Plan and Services                                     Social Determinants of Health (SDOH) Interventions     Readmission Risk Interventions No flowsheet data found.

## 2020-02-17 NOTE — H&P (Shared)
Physical Medicine and Rehabilitation Admission H&P    Chief Complaint  Patient presents with  . Stroke with functional deficits    HPI: [name]. Salah is an 80 year old female with history of T2DM with peripheral neuropathy and retinopathy, RA, CKD--stage III/IV (Dr. Hollie Salk), CVA, CAD/AS s/p TVAR who was admitted on 02/08/20 with reports of headache, left> right sided weakness and difficulty walking. MRI brain negative. On 01/25, she developed sudden onset of LLE numbness with left sided weakness. MRI/MRA brain repeated 01/25 and revealed interval development of 20m acute infarct in right frontal parietal motor cortex, chronic right frontal and right occipital infarcts, bilateral PCA stenosis and loss of flow in R-M1 stent --unchanged since 2020.  She received tPA and follow up MRI showed two foci of acute parenchymal hemorrhage in mid to posterior right frontal lobe with mild surrounding edema and 2 unchanged acute/subacute right frontal lobe infarcts.    Prior 2D echo 12/2019 EF 56% with moderate concentric hypertrophy and well seated aortic valve. TEE not needed. All antiplatelets held due to hemorrhagic conversion and with question plans to start ASA today. Question transient episode of A fib on 01/30--Loop recorder placed today for work up of cryptogenic stroke--need to keep incision C/D for 3 days. Therapy ongoing and patient continues to be limited by Left sided weakness affecting ability to carry out ADLs and mobility. CIR recommended due to functional decline.     Review of Systems  Constitutional: Negative for chills and fever.  HENT: Negative for ear pain, hearing loss and tinnitus.   Eyes: Negative for blurred vision.       Minimal vision in left eye due to retionopathy.   Respiratory: Positive for cough (chronic).   Cardiovascular: Negative for chest pain and palpitations.  Gastrointestinal: Negative for constipation and heartburn.  Genitourinary: Negative for dysuria and  urgency.  Musculoskeletal: Positive for back pain (LBP) and joint pain (rigth hip pain).  Neurological: Positive for sensory change and focal weakness.  Psychiatric/Behavioral: Negative for memory loss.      Past Medical History:  Diagnosis Date  . Arthritis   . Cerebrovascular disease   . CKD (chronic kidney disease)    Sees Dr UHollie Salk . Coronary artery disease   . Depression   . Diabetes ([facility]    INSULIN DEPENDENT  . Diabetic peripheral neuropathy ([facility]   . Diabetic retinopathy ([facility]   . Diverticulitis   . GERD (gastroesophageal reflux disease)   . History of CVA (cerebrovascular accident)    x 2 no residulal  . Hyperlipidemia   . Hypertension   . Obesity   . Renal lesion   . S/P TAVR (transcatheter aortic valve replacement) 11/13/2018   s/p TAVR with a 243mEdwards S3U via the TF approach by Dr. OwRoxy Mannsnd Dr. McAngelena Form. Severe aortic stenosis     Past Surgical History:  Procedure Laterality Date  . ABDOMINAL HYSTERECTOMY    . AV FISTULA PLACEMENT Left 12/18/2017   Procedure: ARTERIOVENOUS (AV) FISTULA CREATION ARM;  Surgeon: ClMarty HeckMD;  [facility];  Laterality: Left;  . bilateral cataract surgery    . CARDIAC CATHETERIZATION  01/07/2014   DR GAChathamESECTION  02/1999  . COLONOSCOPY    . COLOSTOMY  02/1999  . COLOSTOMY CLOSURE  07/1999  . EYE SURGERY Left 2019  . FISTULA SUPERFICIALIZATION Left 06/29/2018   Procedure: FISTULA SUPERFICIALIZATION LEFT BRACHIOCEPHALIC;  Surgeon: ClMarty HeckMD;  [facility];  Service: Vascular;  Laterality: Left;  . LEFT HEART CATHETERIZATION WITH CORONARY ANGIOGRAM N/A 01/07/2014   Procedure: LEFT HEART CATHETERIZATION WITH CORONARY ANGIOGRAM;  Surgeon: Laverda Page, MD;  Location: [facility] CATH LAB;  Service: Cardiovascular;  Laterality: N/A;  . middle cerebral artery stent placement Right   . OTHER SURGICAL HISTORY     laser surgery  . PTCA  01/07/2014   DES to RCA    DR  Einar Gip  . right knee surgery Right    for infection  . RIGHT/LEFT HEART CATH AND CORONARY ANGIOGRAPHY N/A 10/23/2018   Procedure: RIGHT/LEFT HEART CATH AND CORONARY ANGIOGRAPHY;  Surgeon: Nigel Mormon, MD;  Location: [place] CV LAB;  Service: Cardiovascular;  Laterality: N/A;  . TEE WITHOUT CARDIOVERSION N/A 11/13/2018   Procedure: TRANSESOPHAGEAL ECHOCARDIOGRAM (TEE);  Surgeon: Burnell Blanks, MD;  Location: [place];  Service: Open Heart Surgery;  Laterality: N/A;  . TRANSCATHETER AORTIC VALVE REPLACEMENT, TRANSFEMORAL  11/13/2018  . TRANSCATHETER AORTIC VALVE REPLACEMENT, TRANSFEMORAL N/A 11/13/2018   Procedure: TRANSCATHETER AORTIC VALVE REPLACEMENT, TRANSFEMORAL;  Surgeon: Burnell Blanks, MD;  Location: [place];  Service: Open Heart Surgery;  Laterality: N/A;    Family History  Problem Relation Age of Onset  . Diabetes Mother   . Heart disease Mother   . Prostate cancer Father   . Hypertension Brother   . Prostate cancer Brother     Social History:  Live alone. 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 She reports that she has never smoked. She has never used smokeless tobacco. She reports that she does not drink alcohol and does not use drugs.    Allergies: No Known Allergies    Medications Prior to Admission  Medication Sig Dispense Refill  . acetaminophen (TYLENOL) 325 MG tablet Take 650 mg by mouth every 6 (six) hours as needed for moderate pain or headache.     . allopurinol (ZYLOPRIM) 100 MG tablet Take 50 mg by mouth daily.    Marland Kitchen amLODipine (NORVASC) 10 MG tablet TAKE 1 TABLET BY MOUTH EVERY DAY (Patient taking differently: Take 10 mg by mouth daily.) 90 tablet 3  . amoxicillin (AMOXIL) 500 MG tablet Take 4 tablets (2,000 mg total) by mouth as directed. 30-60 min before dental procedure 4 tablet 6  . B-D INS SYR ULTRAFINE 1CC/30G 30G X 1/2" 1 ML MISC Inject 1 each into the skin 3 (three) times daily between meals.    .  calcitRIOL (ROCALTROL) 0.25 MCG capsule Take 0.25 mcg by mouth daily.     . cetirizine (ZYRTEC) 10 MG tablet Take 10 mg by mouth daily as needed for allergies.    . Desvenlafaxine ER 100 MG TB24 Take 100 mg by mouth daily.    . folic acid (FOLVITE) 1 MG tablet Take 1 mg by mouth daily.    Marland Kitchen HUMALOG KWIKPEN 100 UNIT/ML KwikPen Inject 2-8 Units into the skin 3 (three) times daily before meals.     . insulin glargine (LANTUS) 100 UNIT/ML injection Inject 13 Units into the skin at bedtime.    . isosorbide-hydrALAZINE (BIDIL) 20-37.5 MG tablet Take 2 tablets by mouth 3 (three) times daily. 540 tablet 2  . labetalol (NORMODYNE) 100 MG tablet Take 1 tablet (100 mg total) by mouth 2 (two) times daily. (Patient taking differently: Take 200 mg by mouth 2 (two) times daily.) 180 tablet 2  . leflunomide (ARAVA) 10 MG tablet Take 10 mg by mouth daily.    Marland Kitchen  niacin (NIASPAN) 500 MG CR tablet Take 500 mg by mouth daily.     . nortriptyline (PAMELOR) 10 MG capsule Take 10 mg by mouth daily.    . ONE TOUCH ULTRA TEST test strip 1 each by Other route 3 (three) times daily between meals.    . polyvinyl alcohol (LIQUIFILM TEARS) 1.4 % ophthalmic solution Place 1 drop into both eyes 3 (three) times daily as needed for dry eyes.    . rosuvastatin (CRESTOR) 20 MG tablet Take 20 mg by mouth daily.    Marland Kitchen torsemide (DEMADEX) 20 MG tablet Take 1 tablet (20 mg total) by mouth daily. May take additional dose in the afternoon for swelling, shortness of breath or weight gain. (Patient taking differently: Take 40 mg by mouth daily. May take additional dose in the afternoon for swelling, shortness of breath or weight gain.) 180 tablet 0  . brimonidine (ALPHAGAN) 0.2 % ophthalmic solution  (Patient not taking: Reported on 02/08/2020)    . nitroGLYCERIN (NITROSTAT) 0.4 MG SL tablet PLACE 1 TABLET (0.4 MG TOTAL) UNDER THE TONGUE EVERY 5 MINUTES X 3 DOSES AS NEEDED FOR CHEST PAIN. (Patient taking differently: Place 0.4 mg under the tongue  every 5 (five) minutes as needed for chest pain.) 25 tablet 1    Drug Regimen Review { DRUG REGIMEN [id number]  Home: Home Living Family/patient expects to be discharged to:: Private residence Living Arrangements: Alone Available Help at Discharge: Family,Available 24 hours/day Type of Home: House Home Access: Stairs to enter [employer] of Steps: 2 Entrance Stairs-Rails: None Home Layout: Two level Alternate Level Stairs-Number of Steps: flight Alternate Level Stairs-Rails: Left,Right Bathroom Shower/Tub: Multimedia programmer: Standard Home Equipment: Shower seat,Cane - single point   Functional History: Prior Function Level of Independence: Independent  Functional Status:  Mobility: Bed Mobility Overal bed mobility: Needs Assistance Bed Mobility: Supine to Sit Supine to sit: Supervision Sit to supine: Min guard General bed mobility comments: increased time to elevate LLE onto bed Transfers Overall transfer level: Needs assistance Equipment used: Rolling walker (2 wheeled) Transfers: Sit to/from Stand Sit to Stand: Min guard Stand pivot transfers: Min guard General transfer comment: cues for hand placement Ambulation/Gait Ambulation/Gait assistance: Min assist,Min guard Gait Distance (Feet): 80 Feet Assistive device: Rolling walker (2 wheeled) Gait Pattern/deviations: Step-to pattern General Gait Details: pt with slowed step-to gait, reduced foot clearance of LLE. PT cues to increased ankle DF and hip/knee flexion Gait velocity: reduced Gait velocity interpretation: <1.8 ft/sec, indicate of risk for recurrent falls    ADL: ADL Overall ADL's : Needs assistance/impaired Eating/Feeding: Set up,Sitting Grooming: Set up,Sitting Upper Body Dressing : Minimal assistance,Sitting Lower Body Dressing: Moderate assistance,Sitting/lateral leans Functional mobility during ADLs: Minimal assistance,Rolling walker General ADL Comments: multiple LOB  backwards  Cognition: Cognition Overall Cognitive Status: Within Functional Limits for tasks assessed Orientation Level: Oriented X4 Cognition Arousal/Alertness: Awake/alert Behavior During Therapy: WFL for tasks assessed/performed Overall Cognitive Status: Within Functional Limits for tasks assessed Area of Impairment: Problem solving Problem Solving: Slow processing General Comments: Appears WFL for basic mobility tasks, Follows commands well. Able to recall conversation with CIR coordinator earlier.   Blood pressure (!) 163/49, pulse 65, temperature 98.1 F (36.7 C), temperature source Oral, resp. rate 18, height '5\' 3"'$  (1.6 m), weight 67.3 kg, SpO2 100 %. Physical Exam Constitutional:      Appearance: Normal appearance.  Cardiovascular:     Rate and Rhythm: Normal rate.     Heart sounds: Murmur heard.  Musculoskeletal:     Comments: Positive thrill LUE graft. No edema.   Neurological:     Mental Status: She is alert and oriented to person, place, and time.     Results for orders placed or performed during the hospital encounter of 02/08/20 (from the past 48 hour(s))  Glucose, capillary     Status: Abnormal   Collection Time: 02/15/20 11:36 AM  Result Value Ref Range   Glucose-Capillary 198 (H) 70 - 99 mg/dL    Comment: Glucose reference range applies only to samples taken after fasting for at least 8 hours.  Glucose, capillary     Status: Abnormal   Collection Time: 02/15/20  3:38 PM  Result Value Ref Range   Glucose-Capillary 153 (H) 70 - 99 mg/dL    Comment: Glucose reference range applies only to samples taken after fasting for at least 8 hours.  Glucose, capillary     Status: Abnormal   Collection Time: 02/15/20  9:12 PM  Result Value Ref Range   Glucose-Capillary 156 (H) 70 - 99 mg/dL    Comment: Glucose reference range applies only to samples taken after fasting for at least 8 hours.  Glucose, capillary     Status: Abnormal   Collection Time: 02/16/20  6:40  AM  Result Value Ref Range   Glucose-Capillary 138 (H) 70 - 99 mg/dL    Comment: Glucose reference range applies only to samples taken after fasting for at least 8 hours.  Glucose, capillary     Status: Abnormal   Collection Time: 02/16/20  7:50 AM  Result Value Ref Range   Glucose-Capillary 131 (H) 70 - 99 mg/dL    Comment: Glucose reference range applies only to samples taken after fasting for at least 8 hours.   Comment 1 Notify RN    Comment 2 Document in Chart   Glucose, capillary     Status: Abnormal   Collection Time: 02/16/20 12:23 PM  Result Value Ref Range   Glucose-Capillary 112 (H) 70 - 99 mg/dL    Comment: Glucose reference range applies only to samples taken after fasting for at least 8 hours.   Comment 1 Notify RN    Comment 2 Document in Chart   Glucose, capillary     Status: Abnormal   Collection Time: 02/16/20  4:55 PM  Result Value Ref Range   Glucose-Capillary 181 (H) 70 - 99 mg/dL    Comment: Glucose reference range applies only to samples taken after fasting for at least 8 hours.   Comment 1 Notify RN    Comment 2 Document in Chart   Glucose, capillary     Status: Abnormal   Collection Time: 02/16/20  9:15 PM  Result Value Ref Range   Glucose-Capillary 140 (H) 70 - 99 mg/dL    Comment: Glucose reference range applies only to samples taken after fasting for at least 8 hours.  CBC     Status: Abnormal   Collection Time: 02/17/20  1:49 AM  Result Value Ref Range   WBC 11.4 (H) 4.0 - 10.5 K/uL   RBC 3.60 (L) 3.87 - 5.11 MIL/uL   Hemoglobin 10.8 (L) 12.0 - 15.0 g/dL   HCT 32.1 (L) 36.0 - 46.0 %   MCV 89.2 80.0 - 100.0 fL   MCH 30.0 26.0 - 34.0 pg   MCHC 33.6 30.0 - 36.0 g/dL   RDW 13.2 11.5 - 15.5 %   Platelets 169 150 - 400 K/uL   nRBC 0.0 0.0 - 0.2 %  Comment: Performed at [facility], [place] [address], [place], [place] [id number]  Basic metabolic panel     Status: Abnormal   Collection Time: 02/17/20  1:49 AM  Result Value Ref Range   Sodium  135 135 - 145 mmol/L   Potassium 3.7 3.5 - 5.1 mmol/L   Chloride 103 98 - 111 mmol/L   CO2 21 (L) 22 - 32 mmol/L   Glucose, Bld 122 (H) 70 - 99 mg/dL    Comment: Glucose reference range applies only to samples taken after fasting for at least 8 hours.   BUN 41 (H) 8 - 23 mg/dL   Creatinine, Ser 2.60 (H) 0.44 - 1.00 mg/dL   Calcium 9.3 8.9 - 10.3 mg/dL   GFR, Estimated 18 (L) >60 mL/min    Comment: (NOTE) Calculated using the CKD-EPI Creatinine Equation (2021)    Anion gap 11 5 - 15    Comment: Performed at [facility] [place] [address], [place], [place] [address]  Glucose, capillary     Status: Abnormal   Collection Time: 02/17/20  6:26 AM  Result Value Ref Range   Glucose-Capillary 125 (H) 70 - 99 mg/dL    Comment: Glucose reference range applies only to samples taken after fasting for at least 8 hours.  Glucose, capillary     Status: Abnormal   Collection Time: 02/17/20  8:05 AM  Result Value Ref Range   Glucose-Capillary 123 (H) 70 - 99 mg/dL    Comment: Glucose reference range applies only to samples taken after fasting for at least 8 hours.  Glucose, capillary     Status: Abnormal   Collection Time: 02/17/20  8:47 AM  Result Value Ref Range   Glucose-Capillary 122 (H) 70 - 99 mg/dL    Comment: Glucose reference range applies only to samples taken after fasting for at least 8 hours.   No results found.     Medical Problem List and Plan: 1.  *** secondary to ***  -patient may *** shower  -ELOS/Goals: *** 2.  Antithrombotics: -DVT/anticoagulation:  Mechanical: Sequential compression devices, below knee Bilateral lower extremities  -antiplatelet therapy: Transient thrombocytopenia has resolved. ASA to start? 3. Pain Management: tylenol prn for back/right hip pain.  4. Mood: LCSW to follow for evaluation and support.   -antipsychotic agents: N/A 5. Neuropsych: This patient is capable of making decisions on her own behalf. 6. Skin/Wound Care: Routine pressure relief  measures  7. Fluids/Electrolytes/Nutrition: Monitor I/O. Check lytes in am.  8. CAD/TVAR: Followed by Dr. Virgina Jock. Plavix was d/c due to bleed. On Imdur, Crestor and Demadex. 9. HTN: Monitor BP tid--continue Amlodipine, Demadex, Hydralazine and Imdur.  9. T2DM with neuropathy/retionopathy: Hgb A1c- 7.7.   Continue Lantus 10 units with SSI for elevated BS. Will monitor BS ac/hs  10. CKD III/IV: BUN/SCr  30/2.44  at admission -->37/2.91-->41/2.60. Encourage fluid intake--check BMET in am.   --May need to hold diuretic for 1-2 days if worse.  Weight down from 73.9-->67.3 kg. Will monitor weight daily for trend.  11. Leucocytosis: WBC up to 11.4 today--will monitor for fevers and other signs of infection.    ***  Bary Leriche, PA-C 02/17/2020

## 2020-02-17 NOTE — Progress Notes (Signed)
Inpatient Rehabilitation Medication Review by a Pharmacist  A complete drug regimen review was completed for this patient to identify any potential clinically significant medication issues.  Clinically significant medication issues were identified:  yes   Type of Medication Issue Identified Description of Issue Urgent (address now) Non-Urgent (address on AM team rounds) Plan Plan Accepted by Provider? (Yes / No / Pending AM Rounds)  Additional Drug Therapy Needed  Discharge summary indicates plan to resume several prior medications, which were held during inpatient admission:  Allopurinol, Alphagan eye drops, Calcitriol, Folic Acid, Niacin CR, Arava, Nortriptyline, prn Cetirizine.  - Also BiDil, but currently on Hydralazine and Isosorbide at different dosages.  - Also Pristiq 100 mg daily, but on [facility] substitution Effexor XR 150 mg daily.  Non-urgent    Other         For non-urgent medication issues to be resolved on team rounds tomorrow morning a [facility] Secure Chat Handoff was sent to:   Reesa Chew, PA-C   Pharmacist comments:   Aspirin 81 mg daily resumed tonight per prior Neuro plan.  Plavix was discontinued on 02/12/20 (last dose 02/11/20) with plan to continue to hold for now.  Time spent performing this drug regimen review (minutes):  20   [name], [place] 02/17/2020 9:19 PM

## 2020-02-17 NOTE — Consult Note (Addendum)
ELECTROPHYSIOLOGY CONSULT NOTE  Patient ID: [name] MRN: [id number], DOB/AGE: August 31, 1940   Admit date: 02/08/2020 Date of Consult: 02/17/2020  Primary Physician: Audley Hose, MD Primary Cardiologist: Dr. Charolette Child Reason for Consultation: Cryptogenic stroke; recommendations regarding Implantable Loop Recorder, requested by Dr. Leonie Man  History of Present Illness [name] was admitted on 02/08/2020 after waking with progressive L sided weakness, found with stroke > tPA > hemorrhagic conversion    PMHx: HTN, HLD, DM, CKD (III/IV), CAD, prior strokes/TIAs (MCA 2015 s/p stent with subsequent occlusion, CVA R. PCA 08/2018, multiple TIA's), VHD s/p TAVR (2020)  Neurology notes: 2 punctate infarcts in the right ACA cortex s/p tPA, concerning for  cardioembolicetiology.  Hemorrhagic conversion - 2 two new foci of acute hemorrhagic conversion within the paramedian mid to posterior right frontal lobe  Neurology has deferred TEE, also in d/w them, not felt needed a new TTE given she had one last month. Their note also mentions:  08/2018 admitted for right PCA infarct with left upper quadrantanopsia.  CT showed acute/subacute right PCA infarct, old right MCA infarct with right M1 stent.  MRI right occipital infarct.  MRA right M1 stent, left P3 occlusion.  Carotid Doppler negative   Echocardiogram 12/23/2019:  Left ventricle cavity is normal in size. Moderate concentric hypertrophy  of the left ventricle. Normal global wall motion. Normal LV systolic  function with EF 56%. Normal diastolic filling pattern.  Left atrial cavity is mildly dilated.  Well seated Wende Crease Sapien 3 bioprosthetic aortic valve with  normal functioning. Mean PG 11 mmHg. No valvular regurgitation or  paravalvular leak.  Mild (Grade I) mitral regurgitation.  Mild tricuspid regurgitation. Estimated pulmonary artery systolic pressure  31 mmHg.  Mild pulmonic regurgitation.  No significant change  compared to previous study on 12/11/2018.  Lab work is reviewed. WBC 11.4, she is afebrile Creat 2.69 (knonw stage III-IV CKD)   Prior to admission, the patient denies chest pain, shortness of breath, dizziness, palpitations, or syncope.  They are recovering from their stroke with plans to CIR at discharge.    Past Medical History:  Diagnosis Date  . Arthritis   . Cerebrovascular disease   . CKD (chronic kidney disease)    Sees Dr Hollie Salk  . Coronary artery disease   . Depression   . Diabetes ([place])    INSULIN DEPENDENT  . Diabetic peripheral neuropathy ([place])   . Diabetic retinopathy ([place])   . Diverticulitis   . GERD (gastroesophageal reflux disease)   . History of CVA (cerebrovascular accident)    x 2 no residulal  . Hyperlipidemia   . Hypertension   . Obesity   . Renal lesion   . S/P TAVR (transcatheter aortic valve replacement) 11/13/2018   s/p TAVR with a 11m Edwards S3U via the TF approach by Dr. ORoxy Mannsand Dr. MAngelena Form . Severe aortic stenosis      Surgical History:  Past Surgical History:  Procedure Laterality Date  . ABDOMINAL HYSTERECTOMY    . AV FISTULA PLACEMENT Left 12/18/2017   Procedure: ARTERIOVENOUS (AV) FISTULA CREATION ARM;  Surgeon: CMarty Heck MD;  [facility]  Service: Vascular;  Laterality: Left;  . bilateral cataract surgery    . CARDIAC CATHETERIZATION  01/07/2014   [place]  02/[place]  . COLONOSCOPY    . COLOSTOMY  02/1999  . COLOSTOMY CLOSURE  07/1999  . EYE SURGERY Left 2019  . FISTULA SUPERFICIALIZATION Left 06/29/2018  Procedure: FISTULA SUPERFICIALIZATION LEFT BRACHIOCEPHALIC;  Surgeon: Marty Heck, MD;  Location: [facility];  Service: Vascular;  Laterality: Left;  . LEFT HEART CATHETERIZATION WITH CORONARY ANGIOGRAM N/A 01/07/2014   Procedure: LEFT HEART CATHETERIZATION WITH CORONARY ANGIOGRAM;  Surgeon: Laverda Page, MD;  Location: [facility] CATH LAB;  Service: Cardiovascular;  Laterality: N/A;  .  middle cerebral artery stent placement Right   . OTHER SURGICAL HISTORY     laser surgery  . PTCA  01/07/2014   DES to RCA    DR Einar Gip  . right knee surgery Right    for infection  . RIGHT/LEFT HEART CATH AND CORONARY ANGIOGRAPHY N/A 10/23/2018   Procedure: RIGHT/LEFT HEART CATH AND CORONARY ANGIOGRAPHY;  Surgeon: Nigel Mormon, MD;  Location: [facility] CV LAB;  Service: Cardiovascular;  Laterality: N/A;  . TEE WITHOUT CARDIOVERSION N/A 11/13/2018   Procedure: TRANSESOPHAGEAL ECHOCARDIOGRAM (TEE);  Surgeon: Burnell Blanks, MD;  Location: [facility];  Service: Open Heart Surgery;  Laterality: N/A;  . TRANSCATHETER AORTIC VALVE REPLACEMENT, TRANSFEMORAL  11/13/2018  . TRANSCATHETER AORTIC VALVE REPLACEMENT, TRANSFEMORAL N/A 11/13/2018   Procedure: TRANSCATHETER AORTIC VALVE REPLACEMENT, TRANSFEMORAL;  Surgeon: Burnell Blanks, MD;  Location: [place];  Service: Open Heart Surgery;  Laterality: N/A;     Medications Prior to Admission  Medication Sig Dispense Refill Last Dose  . acetaminophen (TYLENOL) 325 MG tablet Take 650 mg by mouth every 6 (six) hours as needed for moderate pain or headache.    unk  . allopurinol (ZYLOPRIM) 100 MG tablet Take 50 mg by mouth daily.   02/07/2020 at Unknown time  . amLODipine (NORVASC) 10 MG tablet TAKE 1 TABLET BY MOUTH EVERY DAY (Patient taking differently: Take 10 mg by mouth daily.) 90 tablet 3 02/07/2020 at Unknown time  . amoxicillin (AMOXIL) 500 MG tablet Take 4 tablets (2,000 mg total) by mouth as directed. 30-60 min before dental procedure 4 tablet 6 unk  . B-D INS SYR ULTRAFINE 1CC/30G 30G X 1/2" 1 ML MISC Inject 1 each into the skin 3 (three) times daily between meals.     . calcitRIOL (ROCALTROL) 0.25 MCG capsule Take 0.25 mcg by mouth daily.    02/07/2020 at Unknown time  . cetirizine (ZYRTEC) 10 MG tablet Take 10 mg by mouth daily as needed for allergies.   Past Week at Unknown time  . Desvenlafaxine ER 100 MG TB24 Take 100 mg by  mouth daily.   02/07/2020 at Unknown time  . folic acid (FOLVITE) 1 MG tablet Take 1 mg by mouth daily.   02/07/2020 at Unknown time  . HUMALOG KWIKPEN 100 UNIT/ML KwikPen Inject 2-8 Units into the skin 3 (three) times daily before meals.    02/07/2020 at Unknown time  . insulin glargine (LANTUS) 100 UNIT/ML injection Inject 13 Units into the skin at bedtime.   02/07/2020 at Unknown time  . isosorbide-hydrALAZINE (BIDIL) 20-37.5 MG tablet Take 2 tablets by mouth 3 (three) times daily. 540 tablet 2 02/07/2020 at Unknown time  . labetalol (NORMODYNE) 100 MG tablet Take 1 tablet (100 mg total) by mouth 2 (two) times daily. (Patient taking differently: Take 200 mg by mouth 2 (two) times daily.) 180 tablet 2 02/07/2020 at 2200  . leflunomide (ARAVA) 10 MG tablet Take 10 mg by mouth daily.   02/07/2020 at Unknown time  . niacin (NIASPAN) 500 MG CR tablet Take 500 mg by mouth daily.    02/07/2020 at Unknown time  . nortriptyline (PAMELOR) 10 MG capsule  Take 10 mg by mouth daily.   02/07/2020 at Unknown time  . ONE TOUCH ULTRA TEST test strip 1 each by Other route 3 (three) times daily between meals.     . polyvinyl alcohol (LIQUIFILM TEARS) 1.4 % ophthalmic solution Place 1 drop into both eyes 3 (three) times daily as needed for dry eyes.   Past Week at Unknown time  . rosuvastatin (CRESTOR) 20 MG tablet Take 20 mg by mouth daily.   02/07/2020 at Unknown time  . torsemide (DEMADEX) 20 MG tablet Take 1 tablet (20 mg total) by mouth daily. May take additional dose in the afternoon for swelling, shortness of breath or weight gain. (Patient taking differently: Take 40 mg by mouth daily. May take additional dose in the afternoon for swelling, shortness of breath or weight gain.) 180 tablet 0 02/07/2020 at Unknown time  . brimonidine (ALPHAGAN) 0.2 % ophthalmic solution  (Patient not taking: Reported on 02/08/2020)   Not Taking at Unknown time  . nitroGLYCERIN (NITROSTAT) 0.4 MG SL tablet PLACE 1 TABLET (0.4 MG TOTAL) UNDER  THE TONGUE EVERY 5 MINUTES X 3 DOSES AS NEEDED FOR CHEST PAIN. (Patient taking differently: Place 0.4 mg under the tongue every 5 (five) minutes as needed for chest pain.) 25 tablet 1 unk    Inpatient Medications:  . amLODipine  10 mg Oral Daily  . Chlorhexidine Gluconate Cloth  6 each Topical Daily  . hydrALAZINE  50 mg Oral Q8H  . insulin aspart  0-15 Units Subcutaneous TID WC  . insulin glargine  10 Units Subcutaneous QHS  . isosorbide mononitrate  30 mg Oral Daily  . labetalol  200 mg Oral BID  . LORazepam  0.5 mg Intravenous Once  . polyethylene glycol  17 g Oral Daily  . rosuvastatin  40 mg Oral Daily  . senna-docusate  2 tablet Oral BID  . torsemide  40 mg Oral Daily  . venlafaxine XR  150 mg Oral Daily    Allergies: No Known Allergies  Social History   Socioeconomic History  . Marital status: Widowed    Spouse name: Not on file  . Number of children: 2  . Years of education: PHD  . Highest education level: Not on file  Occupational History  . Occupation: Retired PhD Pharmacist, hospital [employer]  Tobacco Use  . Smoking status: Never Smoker  . Smokeless tobacco: Never Used  Vaping Use  . Vaping Use: Never used  Substance and Sexual Activity  . Alcohol use: No  . Drug use: No  . Sexual activity: Not on file  Other Topics Concern  . Not on file  Social History Narrative   Patient is widowed and lives with her two adopted children.   Patient is retired.   Patient has two children of her own and two adopted children.   Patient has a PHD.   Patient is right-handed.   Social Determinants of Health   Financial Resource Strain: Not on file  Food Insecurity: Not on file  Transportation Needs: Not on file  Physical Activity: Not on file  Stress: Not on file  Social Connections: Not on file  Intimate Partner Violence: Not on file     Family History  Problem Relation Age of Onset  . Diabetes Mother   . Heart disease Mother   . Prostate cancer Father   . Hypertension  Brother   . Prostate cancer Brother       Review of Systems: All other systems reviewed and are otherwise  negative except as noted above.  Physical Exam: Vitals:   02/16/20 1955 02/17/20 0022 02/17/20 0356 02/17/20 0802  BP: (!) 138/43 (!) 155/57 (!) 157/55 (!) 163/49  Pulse: 64 62 67 65  Resp: '17 14 17 18  '$ Temp: 98.4 F (36.9 C) 98 F (36.7 C) 98 F (36.7 C) 98.1 F (36.7 C)  TempSrc: Oral Oral Oral Oral  SpO2: 100% 100% 100% 100%  Weight:      Height:        GEN- The patient is well appearing, alert and oriented x 3 today.   Head- normocephalic, atraumatic Eyes-  Sclera clear, conjunctiva pink Ears- hearing intact Oropharynx- clear Neck- supple Lungs- CTA b/l Heart-  RRR, no murmurs, rubs or gallops  GI- soft, NT, ND, + BS Extremities- no clubbing, cyanosis, or edema MS- no significant deformity or atrophy Skin- no rash or lesion Psych- euthymic mood, full affect   Labs:   Lab Results  Component Value Date   WBC 11.4 (H) 02/17/2020   HGB 10.8 (L) 02/17/2020   HCT 32.1 (L) 02/17/2020   MCV 89.2 02/17/2020   PLT 169 02/17/2020    Recent Labs  Lab 02/17/20 0149  NA 135  K 3.7  CL 103  CO2 21*  BUN 41*  CREATININE 2.60*  CALCIUM 9.3  GLUCOSE 122*   Lab Results  Component Value Date   CKTOTAL 170 11/06/2006   CKMB 2.1 11/06/2006   TROPONINI 0.01        NO INDICATION OF MYOCARDIAL INJURY. 11/06/2006   Lab Results  Component Value Date   CHOL 296 (H) 02/11/2020   CHOL 289 (H) 02/08/2020   CHOL 147 09/10/2018   Lab Results  Component Value Date   HDL 67 02/11/2020   HDL 76 02/08/2020   HDL 76 09/10/2018   Lab Results  Component Value Date   LDLCALC 199 (H) 02/11/2020   LDLCALC 185 (H) 02/08/2020   LDLCALC 58 09/10/2018   Lab Results  Component Value Date   TRIG 148 02/11/2020   TRIG 140 02/08/2020   TRIG 64 09/10/2018   Lab Results  Component Value Date   CHOLHDL 4.4 02/11/2020   CHOLHDL 3.8 02/08/2020   CHOLHDL 1.9  09/10/2018   No results found for: LDLDIRECT  No results found for: DDIMER   Radiology/Studies:   CT HEAD WO CONTRAST Result Date: 02/14/2020 CLINICAL DATA:  Intracranial hemorrhage follow-up EXAM: CT HEAD WITHOUT CONTRAST TECHNIQUE: Contiguous axial images were obtained from the base of the skull through the vertex without intravenous contrast. COMPARISON:  02/12/2020 FINDINGS: Brain: Stable adjacent foci of hemorrhage in the paramedian right frontal lobe. Stable mild associated edema. No new hemorrhage. No significant mass effect. Chronic right frontal and occipital infarcts. Stable findings of probable mild chronic microvascular ischemic changes. Ventricles are stable in size. Vascular: No new findings. Skull: Calvarium is unremarkable. Sinuses/Orbits: No acute finding. Other: None. IMPRESSION: Stable adjacent foci of parenchymal hemorrhage in the paramedian right frontal lobe with mild edema. No new hemorrhage or mass effect. Electronically Signed   By: Macy Mis [name]   On: 02/14/2020 11:06    CT HEAD WO CONTRAST Result Date: 02/12/2020 CLINICAL DATA:  Follow-up cerebral hemorrhage. EXAM: CT HEAD WITHOUT CONTRAST TECHNIQUE: Contiguous axial images were obtained from the base of the skull through the vertex without intravenous contrast. COMPARISON:  Head MRI 02/12/2020 FINDINGS: Brain: Small adjacent parenchymal hemorrhages in the paramedian posterior right frontal lobe are unchanged from today's MRI with the larger, more  posteriorly located hemorrhage measuring 1.6 cm. There is persistent mild edema without mass effect. Subcentimeter acute or early subacute infarcts in the posterior right frontal lobe on MRI are not well demonstrated by CT. Chronic infarcts are again noted in the right frontal and right occipital lobes. Hypodensities in the cerebral white matter bilaterally are nonspecific but compatible with mild chronic small vessel ischemic disease. Mild generalized cerebral atrophy is within  normal limits for age. No midline shift or extra-axial fluid collection is identified. Vascular: Calcified atherosclerosis at the skull base. Right MCA stent. Skull: No fracture or suspicious osseous lesion. Sinuses/Orbits: Minimal posterior left ethmoid air cell mucosal thickening. Clear mastoid air cells. Bilateral cataract extraction. Other: None. IMPRESSION: 1. Unchanged small paramedian right frontal lobe parenchymal hemorrhages with mild edema. No mass effect. 2. Chronic ischemia including old right frontal and occipital infarcts. Electronically Signed   By: Logan Bores [name]   On: 02/12/2020 21:30     CT HEAD WO CONTRAST Result Date: 02/08/2020 CLINICAL DATA:  History of prior strokes, acute left hand paresthesias, heavy legs EXAM: CT HEAD WITHOUT CONTRAST TECHNIQUE: Contiguous axial images were obtained from the base of the skull through the vertex without intravenous contrast. COMPARISON:  09/09/2018 FINDINGS: Brain: Chronic encephalomalacia in the central right frontal lobe and the right occipital lobe from remote infarcts. No acute intracranial hemorrhage, area of new infarction, mass lesion, midline shift, herniation or hydrocephalus. No extra-axial fluid collection. No focal mass effect or edema. cisterns are patent. No cerebellar abnormality. Vascular: Intracranial atherosclerosis at the skull base. Chronic right MCA M1 vascular stent again no. Skull: Normal. Negative for fracture or focal lesion. Sinuses/Orbits: No acute finding. Other: None. IMPRESSION: Remote right frontal and right occipital infarcts with encephalomalacia. No acute intracranial finding by noncontrast CT. Electronically Signed   By: Jerilynn Mages.  Shick [name]   On: 02/08/2020 06:53     MR ANGIO HEAD WO CONTRAST Result Date: 02/11/2020 CLINICAL DATA:  Stroke.  Left leg weakness. EXAM: MRI HEAD WITHOUT CONTRAST MRA HEAD WITHOUT CONTRAST TECHNIQUE: Multiplanar, multiecho pulse sequences of the brain and surrounding structures were  obtained without intravenous contrast. Angiographic images of the head were obtained using MRA technique without contrast. COMPARISON:  MRI head 02/08/2020.  MRA head 09/10/2018 FINDINGS: MRI HEAD FINDINGS Brain: Interval development of small area of restricted diffusion in the right frontal parietal cortex over the convexity. This appears to be in the motor cortex accounting for the patient's left leg weakness. FLAIR imaging is normal in this area. No other areas of restricted diffusion Chronic infarcts in the right posterior frontal lobe and right occipital lobe unchanged. Mild white matter changes. Limited protocol per neurology. These results were called by telephone at the time of interpretation on 02/11/2020 at 8:30 am to provider [facility] , who verbally acknowledged these results. MRA HEAD FINDINGS Internal carotid artery widely patent bilaterally. Anterior cerebral arteries patent bilaterally. Left middle cerebral artery widely patent. Stent in the right M1 segment. Loss of flow related signal throughout the right middle cerebral artery occluding M1, M2, M3 branches as noted on the prior MRI a of 09/10/2018. Loss of signal may be due to stent related artifact versus occlusion. Both vertebral arteries patent to the basilar. Right vertebral artery dominant. Basilar widely patent. Right AICA patent. Fetal origin left posterior cerebral artery. Patent right posterior communicating artery. Mild stenosis distal right posterior cerebral artery. High-grade stenosis versus occlusion left P3 segment, unchanged from the prior MRA. IMPRESSION: 1. Interval development of 5 mm  area of acute infarct in the right frontal parietal cortex over the convexity. This appears to be in the motor cortex. FLAIR imaging normal in this region. 2. Chronic infarct right frontal and right occipital lobe unchanged. 3. MRA demonstrates right M1 stent with loss of flow in the right middle cerebral artery unchanged from 2020 MRA.  Possible occlusion versus artifact. 4. Bilateral PCA stenosis.  No change from 2020 Electronically Signed   By: Franchot Gallo [name]   On: 02/11/2020 08:43     MR BRAIN WO CONTRAST Result Date: 02/12/2020 CLINICAL DATA:  Stroke, follow-up. EXAM: MRI HEAD WITHOUT CONTRAST TECHNIQUE: Multiplanar, multiecho pulse sequences of the brain and surrounding structures were obtained without intravenous contrast. COMPARISON:  MRI/MRA head 02/11/2020, noncontrast head CT 02/11/2020, brain MRI 02/08/2020. FINDINGS: Brain: Mild intermittent motion degradation. Mild cerebral and cerebellar atrophy. Two unchanged 4 mm foci of restricted diffusion within the posterior right frontal lobe compatible with acute/early subacute infarction. One of these infarcts involve the motor strip. Since the prior MRI of 02/11/2020, there has been interval development of two foci of acute parenchymal hemorrhage within the paramedian mid and posterior right frontal lobe. The more posterior of the parenchymal hemorrhages measures 1.2 cm and has mild surrounding vasogenic edema. The focus of parenchymal hemorrhage more anteriorly measures 5 mm. Redemonstrated chronic cortically based infarcts within the posterior right frontal lobe and right occipital lobe. Mild multifocal T2/FLAIR hyperintensity elsewhere within the cerebral white matter is nonspecific, but compatible with chronic small vessel ischemic disease. Small chronic infarct within the left cerebellum. As before, there is mild hemosiderin deposition associated with the chronic right occipital lobe infarct. Redemonstrated right periatrial and right occipital lobe chronic microhemorrhages. No extra-axial fluid collection. No midline shift. Partially empty sella turcica. Vascular: No appreciable change. Septi artifact arising from a stent within the M1 right middle cerebral artery. Skull and upper cervical spine: No focal marrow lesion. Sinuses/Orbits: Bilateral lens replacements. Visualized  orbits show no acute finding. Mild ethmoid sinus mucosal thickening. Small right maxillary sinus mucous retention cyst. These results were called by telephone at the time of interpretation on 02/12/2020 at 9:44 am to provider Dr. Erlinda Hong, who verbally acknowledged these results. IMPRESSION: Interval development of two foci of acute parenchymal hemorrhage within the paramedian mid to posterior right frontal lobe, the largest measuring 1.2 cm with mild surrounding vasogenic edema. Two unchanged 4 mm acute/early subacute infarcts within the posterior right frontal lobe, one involving the motor strip. Otherwise stable non-contrast MRI appearance of the brain. Chronic cortically based infarcts within the right frontal and occipital lobes. Background mild generalized atrophy of the brain and chronic small vessel ischemic disease. Small chronic left cerebellar infarct. Electronically Signed   By: Kellie Simmering DO   On: 02/12/2020 09:45    MR BRAIN WO CONTRAST Result Date: 02/08/2020 CLINICAL DATA:  Tingling and numbness in the left hand. EXAM: MRI HEAD WITHOUT CONTRAST TECHNIQUE: Multiplanar, multiecho pulse sequences of the brain and surrounding structures were obtained without intravenous contrast. COMPARISON:  09/10/2018 FINDINGS: Brain: Moderate remote infarcts in the right superior frontal and occipital lobes. No acute infarct, hemorrhage, hydrocephalus, or masslike finding. Chronic blood products associated with the remote infarcts and the superior left frontal cortex. Vascular: Normal flow voids.  There is a right MCA stent present. Skull and upper cervical spine: Normal marrow signal Sinuses/Orbits: Bilateral cataract resection. Mucosal thickening in the paranasal sinuses. IMPRESSION: 1. No acute finding. 2. Remote right cerebral infarcts. Electronically Signed   By: Angelica Chessman  Watts [name]   On: 02/08/2020 09:26     CT HEAD CODE STROKE WO CONTRAST` Result Date: 02/11/2020 CLINICAL DATA:  Code stroke.  80 year old  female EXAM: CT HEAD WITHOUT CONTRAST TECHNIQUE: Contiguous axial images were obtained from the base of the skull through the vertex without intravenous contrast. COMPARISON:  Brain MRI and head CT 02/08/2020. FINDINGS: Brain: Stable encephalomalacia in the right middle frontal gyrus, right occipital lobe. Stable gray-white matter differentiation throughout the brain. No midline shift, ventriculomegaly, mass effect, evidence of mass lesion, intracranial hemorrhage or evidence of cortically based acute infarction. Vascular: Calcified atherosclerosis at the skull base. Right MCA M1 stent in stable position. No suspicious intracranial vascular hyperdensity. Skull: No acute osseous abnormality identified. Sinuses/Orbits: Visualized paranasal sinuses and mastoids are stable and well pneumatized. Other: Stable orbit and scalp soft tissues. ASPECTS [facility] Stroke Program Early CT Score) Total score (0-10 with 10 being normal): 10 (chronic right hemisphere encephalomalacia) IMPRESSION: 1. No acute or of all vein cortically based infarct identified. Acute intracranial hemorrhage. ASPECTS 10. 2. Right MCA M1 stent. Right middle frontal gyrus and occipital lobe encephalomalacia. 3. These results were communicated to Dr. Curly Shores at 7:49 am on 02/11/2020 by text page via the [facility] messaging system. Electronically Signed   By: Genevie Ann [name]   On: 02/11/2020 07:49    12-lead ECG SR All prior EKG's in EPIC reviewed with no documented atrial fibrillation  Telemetry SR, occasionally has fairly frequent PACs, rarely 3-4beat PAT There are many strips labeled AFib, though are not true AF  Assessment and Plan:  1. Cryptogenic stroke The patient presents with cryptogenic stroke.   I spoke at length with the patient about monitoring for afib with either a 30 day event monitor or an implantable loop recorder.  Risks, benefits, and alteratives to implantable loop recorder were discussed with the patient today.   At this time, the  patient is very clear in her decision to proceed with implantable loop recorder.    If early AFib is found, will need imput from neurology for timing of [place] given hemorrhagic conversion  Wound care was reviewed with the patient (keep incision clean and dry for 3 days).  Wound check will be scheduled for the patient  Please call with questions.   Baldwin Jamaica, PA-C 02/17/2020  Cryptogenic Stroke  Rx tPA cx by hemorrhagic transformation  HTN  AS  S/p TAVR   For loop recorder for Cryptogenic Stroke multiple   Discussed the role of anticoagulation in the event of identified afib; she understands better than most the issues at hand Agreeable to go for loop insertion

## 2020-02-17 NOTE — Care Management Important Message (Signed)
Important Message  Patient Details  Name: [name] MRN: [id number] Date of Birth: 03/29/1940   Medicare Important Message Given:  Yes     [name] [address] 02/17/2020, 3:56 PM

## 2020-02-17 NOTE — Plan of Care (Signed)
Pt arrived to unit via bed by staff. Pt alert and oriented x4, in no distress, breathing even and unlabored, no SOB noted. Pt and daughter oriented to unit, room, and staff. Pt has no concerns at this time. Assessment complete.

## 2020-02-17 NOTE — Progress Notes (Signed)
Physical Medicine and Rehabilitation Consult Reason for Consult: Left side weakness Referring Physician: [facility]     HPI: [name] is a 80 y.o. right-handed female with history of diabetes mellitus, CKD followed by Dr. Hollie Salk, hyperlipidemia, history of CVA x2 without residual weakness including right MCA stenting, obesity with BMI 26.28, CAD with TVAR procedure.  Per chart review patient lives alone independent prior to admission.  Two-level home 2 steps to entry.  Presented 02/08/2020 with headache transient left arm weakness and numbness.  Cranial CT scan negative for acute changes.  Patient did not receive TPA.  MRI showed interval development of 2 foci of acute parenchymal hemorrhage within the paramedian mid to posterior right frontal lobe largest measuring 1.2 cm with mild surrounding vasogenic edema.  2 unchanged 4 mm acute early subacute infarct within the posterior right frontal lobe.  Admission chemistries BUN 27, creatinine 2.49, hemoglobin A1c 7.7.  Neurology follow-up close monitoring of blood pressure and awaiting plan for loop recorder placement and awaiting plan for loop recorder placement.  Therapy evaluations completed with recommendations of physical medicine rehab consult.     Pt reports a spot on left side of middle back hurts.  Otherwise, no pain from RA or current issues- sleeping well.  LBM was likely Sunday (nurse thought 1/25, but pt emphatic was earlier than that- appeared to be good historian).  Voiding OK with purewick.    Review of Systems  Constitutional: Negative for chills and fever.  HENT: Negative for hearing loss.   Eyes: Negative for blurred vision and double vision.  Respiratory: Negative for cough and shortness of breath.   Cardiovascular: Positive for leg swelling. Negative for chest pain and palpitations.  Gastrointestinal: Positive for constipation. Negative for heartburn, nausea and vomiting.       GERD  Genitourinary: Negative for  dysuria, flank pain and hematuria.  Musculoskeletal: Positive for myalgias.  Skin: Negative for rash.  Neurological: Positive for sensory change and weakness.  Psychiatric/Behavioral: Positive for depression. The patient has insomnia.   All other systems reviewed and are negative.       Past Medical History:  Diagnosis Date  . Arthritis    . Cerebrovascular disease    . CKD (chronic kidney disease)      Sees Dr Hollie Salk  . Coronary artery disease    . Depression    . Diabetes ([place])      INSULIN DEPENDENT  . Diabetic peripheral neuropathy ([place])    . Diabetic retinopathy ([place])    . Diverticulitis    . GERD (gastroesophageal reflux disease)    . History of CVA (cerebrovascular accident)      x 2 no residulal  . Hyperlipidemia    . Hypertension    . Obesity    . Renal lesion    . S/P TAVR (transcatheter aortic valve replacement) 11/13/2018    s/p TAVR with a 10m Edwards S3U via the TF approach by Dr. ORoxy Mannsand Dr. MAngelena Form . Severe aortic stenosis           Past Surgical History:  Procedure Laterality Date  . ABDOMINAL HYSTERECTOMY      . AV FISTULA PLACEMENT Left 12/18/2017    Procedure: ARTERIOVENOUS (AV) FISTULA CREATION ARM;  Surgeon: CMarty Heck MD;  Location: [facility]  Service: Vascular;  Laterality: Left;  . bilateral cataract surgery      . CARDIAC CATHETERIZATION   01/07/2014    DR GEinar Gip .  COLON RESECTION   02/1999  . COLONOSCOPY      . COLOSTOMY   02/1999  . COLOSTOMY CLOSURE   07/1999  . EYE SURGERY Left 2019  . FISTULA SUPERFICIALIZATION Left 06/29/2018    Procedure: FISTULA SUPERFICIALIZATION LEFT BRACHIOCEPHALIC;  Surgeon: Marty Heck, MD;  Location: [facility];  Service: Vascular;  Laterality: Left;  . LEFT HEART CATHETERIZATION WITH CORONARY ANGIOGRAM N/A 01/07/2014    Procedure: LEFT HEART CATHETERIZATION WITH CORONARY ANGIOGRAM;  Surgeon: Laverda Page, MD;  Location: [facility] CATH LAB;  Service: Cardiovascular;  Laterality: N/A;  . middle  cerebral artery stent placement Right    . OTHER SURGICAL HISTORY        laser surgery  . PTCA   01/07/2014    DES to RCA    DR Einar Gip  . right knee surgery Right      for infection  . RIGHT/LEFT HEART CATH AND CORONARY ANGIOGRAPHY N/A 10/23/2018    Procedure: RIGHT/LEFT HEART CATH AND CORONARY ANGIOGRAPHY;  Surgeon: Nigel Mormon, MD;  Location: [facility] CV LAB;  Service: Cardiovascular;  Laterality: N/A;  . TEE WITHOUT CARDIOVERSION N/A 11/13/2018    Procedure: TRANSESOPHAGEAL ECHOCARDIOGRAM (TEE);  Surgeon: Burnell Blanks, MD;  Location: [facility];  Service: Open Heart Surgery;  Laterality: N/A;  . TRANSCATHETER AORTIC VALVE REPLACEMENT, TRANSFEMORAL   11/13/2018  . TRANSCATHETER AORTIC VALVE REPLACEMENT, TRANSFEMORAL N/A 11/13/2018    Procedure: TRANSCATHETER AORTIC VALVE REPLACEMENT, TRANSFEMORAL;  Surgeon: Burnell Blanks, MD;  Location: [facility];  Service: Open Heart Surgery;  Laterality: N/A;         Family History  Problem Relation Age of Onset  . Diabetes Mother    . Heart disease Mother    . Prostate cancer Father    . Hypertension Brother    . Prostate cancer Brother      Social History:  reports that she has never smoked. She has never used smokeless tobacco. She reports that she does not drink alcohol and does not use drugs. Allergies: No Known Allergies       Medications Prior to Admission  Medication Sig Dispense Refill  . acetaminophen (TYLENOL) 325 MG tablet Take 650 mg by mouth every 6 (six) hours as needed for moderate pain or headache.       . allopurinol (ZYLOPRIM) 100 MG tablet Take 50 mg by mouth daily.      Marland Kitchen amLODipine (NORVASC) 10 MG tablet TAKE 1 TABLET BY MOUTH EVERY DAY (Patient taking differently: Take 10 mg by mouth daily.) 90 tablet 3  . amoxicillin (AMOXIL) 500 MG tablet Take 4 tablets (2,000 mg total) by mouth as directed. 30-60 min before dental procedure 4 tablet 6  . B-D INS SYR ULTRAFINE 1CC/30G 30G X 1/2" 1 ML MISC Inject 1  each into the skin 3 (three) times daily between meals.      . calcitRIOL (ROCALTROL) 0.25 MCG capsule Take 0.25 mcg by mouth daily.       . cetirizine (ZYRTEC) 10 MG tablet Take 10 mg by mouth daily as needed for allergies.      . Desvenlafaxine ER 100 MG TB24 Take 100 mg by mouth daily.      . folic acid (FOLVITE) 1 MG tablet Take 1 mg by mouth daily.      Marland Kitchen HUMALOG KWIKPEN 100 UNIT/ML KwikPen Inject 2-8 Units into the skin 3 (three) times daily before meals.       . insulin glargine (LANTUS) 100 UNIT/ML injection  Inject 13 Units into the skin at bedtime.      . isosorbide-hydrALAZINE (BIDIL) 20-37.5 MG tablet Take 2 tablets by mouth 3 (three) times daily. 540 tablet 2  . labetalol (NORMODYNE) 100 MG tablet Take 1 tablet (100 mg total) by mouth 2 (two) times daily. (Patient taking differently: Take 200 mg by mouth 2 (two) times daily.) 180 tablet 2  . leflunomide (ARAVA) 10 MG tablet Take 10 mg by mouth daily.      . niacin (NIASPAN) 500 MG CR tablet Take 500 mg by mouth daily.       . nortriptyline (PAMELOR) 10 MG capsule Take 10 mg by mouth daily.      . ONE TOUCH ULTRA TEST test strip 1 each by Other route 3 (three) times daily between meals.      . polyvinyl alcohol (LIQUIFILM TEARS) 1.4 % ophthalmic solution Place 1 drop into both eyes 3 (three) times daily as needed for dry eyes.      . rosuvastatin (CRESTOR) 20 MG tablet Take 20 mg by mouth daily.      Marland Kitchen torsemide (DEMADEX) 20 MG tablet Take 1 tablet (20 mg total) by mouth daily. May take additional dose in the afternoon for swelling, shortness of breath or weight gain. (Patient taking differently: Take 40 mg by mouth daily. May take additional dose in the afternoon for swelling, shortness of breath or weight gain.) 180 tablet 0  . brimonidine (ALPHAGAN) 0.2 % ophthalmic solution  (Patient not taking: Reported on 02/08/2020)      . nitroGLYCERIN (NITROSTAT) 0.4 MG SL tablet PLACE 1 TABLET (0.4 MG TOTAL) UNDER THE TONGUE EVERY 5 MINUTES X 3  DOSES AS NEEDED FOR CHEST PAIN. (Patient taking differently: Place 0.4 mg under the tongue every 5 (five) minutes as needed for chest pain.) 25 tablet 1      Home: Home Living Family/patient expects to be discharged to:: Private residence Living Arrangements: Alone Available Help at Discharge: Family,Available 24 hours/day Type of Home: House Home Access: Stairs to enter [employer] of Steps: 2 Entrance Stairs-Rails: None Home Layout: Two level Alternate Level Stairs-Number of Steps: flight Alternate Level Stairs-Rails: Left,Right Bathroom Shower/Tub: Multimedia programmer: Standard Home Equipment: Shower seat,Cane - single point  Functional History: Prior Function Level of Independence: Independent Functional Status:  Mobility: Bed Mobility Overal bed mobility: Needs Assistance Bed Mobility: Supine to Sit Supine to sit: Min assist Sit to supine: Supervision General bed mobility comments: pt up in chair upon PT arrival Transfers Overall transfer level: Needs assistance Equipment used: 1 person hand held assist Transfers: Sit to/from [employer] Sit to Stand: Min assist Stand pivot transfers: Min assist General transfer comment: pt with retropulsion with standing, max verbal cues for hand placement, modA to power up and steady during transition of hands to RW, minA to advance L LE, increased time, difficulty sequencing stepping to/from [employer] Ambulation/Gait Ambulation/Gait assistance: Min guard Gait Distance (Feet): 5 Feet Assistive device: 1 person hand held assist Gait Pattern/deviations: Step-through pattern,Decreased stride length General Gait Details: attempted to march in place in RW however pt with strong L lateral lean, with tactile and verbal cues able to correct but then became nauseated and was very sleepy Gait velocity: Decreased   ADL: ADL Overall ADL's : Needs assistance/impaired Eating/Feeding: Set  up,Sitting Grooming: Set up,Sitting Upper Body Dressing : Minimal assistance,Sitting Lower Body Dressing: Moderate assistance,Sitting/lateral leans Functional mobility during ADLs: Minimal assistance General ADL Comments: multiple LOB backwards   Cognition: Cognition  Overall Cognitive Status: Impaired/Different from baseline Orientation Level: Oriented X4 Cognition Arousal/Alertness: Lethargic,Suspect due to medications Behavior During Therapy: Flat affect Overall Cognitive Status: Impaired/Different from baseline Area of Impairment: Problem solving Problem Solving: Slow processing General Comments: pt received ativan earlier for scheduled MRI   Blood pressure (!) 142/47, pulse (!) 53, temperature 97.7 F (36.5 C), temperature source Oral, resp. rate (!) 21, height '5\' 3"'$  (1.6 m), weight 67.3 kg, SpO2 100 %. Physical Exam Vitals and nursing note reviewed.  Constitutional:      Comments: Awake, lert, but sleepy, appropriate, laying supine in bed, NAD   HENT:     Head:     Comments: Smile equal- tongue midline    Right Ear: External ear normal.     Left Ear: External ear normal.     Nose: Nose normal. No congestion.     Mouth/Throat:     Mouth: Mucous membranes are dry.     Pharynx: Oropharynx is clear. No oropharyngeal exudate.  Eyes:     Comments: EOMI B/L however lacking vision in L visual field- no nystagmus; mild L lid lag  Cardiovascular:     Comments: Borderline bradycardia regular rhythm Pulmonary:     Comments: CTA B/L- no W/R/R- good air movement  Abdominal:     Comments: Soft, NT, ND, (+)BS -hypoactive  Genitourinary:    Comments: purewick in place- draining medium amber urine Musculoskeletal:     Cervical back: Normal range of motion. No rigidity.     Comments: RUE 5/5 LUE- 4/5 in Bicep and tricep and 4+/5 in grip and Rohlman abd RLE_ 5/5 LLE- HF 3-/5, KE 3/5, DF 2/5, PF 2+/5  Has L CVA tenderness- significant CVA TTP  Skin:    Comments: 2 IV's RUE- look  OK  Neurological:     Comments: Patient is awake alert no acute distress oriented x3.  Ox3- appropriate  Intact to light touch in all 4 extremities.  Knew all cognitive questions including the next legal or celebrated holiday (valentines' day)   Psychiatric:     Comments: Sleepy, but appropriate        Lab Results Last 24 Hours       Results for orders placed or performed during the hospital encounter of 02/08/20 (from the past 24 hour(s))  Hemoglobin A1c     Status: Abnormal    Collection Time: 02/11/20  4:00 PM  Result Value Ref Range    Hgb A1c MFr Bld 7.7 (H) 4.8 - 5.6 %    Mean Plasma Glucose 174.29 mg/dL  Glucose, capillary     Status: Abnormal    Collection Time: 02/11/20  4:13 PM  Result Value Ref Range    Glucose-Capillary 155 (H) 70 - 99 mg/dL  Lipid panel     Status: Abnormal    Collection Time: 02/11/20  4:25 PM  Result Value Ref Range    Cholesterol 296 (H) 0 - 200 mg/dL    Triglycerides 148 <150 mg/dL    HDL 67 >40 mg/dL    Total CHOL/HDL Ratio 4.4 RATIO    VLDL 30 0 - 40 mg/dL    LDL Cholesterol 199 (H) 0 - 99 mg/dL  Glucose, capillary     Status: Abnormal    Collection Time: 02/11/20  9:31 PM  Result Value Ref Range    Glucose-Capillary 135 (H) 70 - 99 mg/dL  Glucose, capillary     Status: Abnormal    Collection Time: 02/12/20  6:42 AM  Result Value Ref  Range    Glucose-Capillary 130 (H) 70 - 99 mg/dL  CBC     Status: Abnormal    Collection Time: 02/12/20  6:53 AM  Result Value Ref Range    WBC 7.0 4.0 - 10.5 K/uL    RBC 3.83 (L) 3.87 - 5.11 MIL/uL    Hemoglobin 11.4 (L) 12.0 - 15.0 g/dL    HCT 34.2 (L) 36.0 - 46.0 %    MCV 89.3 80.0 - 100.0 fL    MCH 29.8 26.0 - 34.0 pg    MCHC 33.3 30.0 - 36.0 g/dL    RDW 13.5 11.5 - 15.5 %    Platelets 120 (L) 150 - 400 K/uL    nRBC 0.0 0.0 - 0.2 %  Basic metabolic panel     Status: Abnormal    Collection Time: 02/12/20  6:53 AM  Result Value Ref Range    Sodium 136 135 - 145 mmol/L    Potassium 3.9 3.5 -  5.1 mmol/L    Chloride 103 98 - 111 mmol/L    CO2 20 (L) 22 - 32 mmol/L    Glucose, Bld 128 (H) 70 - 99 mg/dL    BUN 33 (H) 8 - 23 mg/dL    Creatinine, Ser 2.65 (H) 0.44 - 1.00 mg/dL    Calcium 8.8 (L) 8.9 - 10.3 mg/dL    GFR, Estimated 18 (L) >60 mL/min    Anion gap 13 5 - 15  Glucose, capillary     Status: Abnormal    Collection Time: 02/12/20 11:28 AM  Result Value Ref Range    Glucose-Capillary 236 (H) 70 - 99 mg/dL       Imaging Results (Last 48 hours)  MR ANGIO HEAD WO CONTRAST   Result Date: 02/11/2020 CLINICAL DATA:  Stroke.  Left leg weakness. EXAM: MRI HEAD WITHOUT CONTRAST MRA HEAD WITHOUT CONTRAST TECHNIQUE: Multiplanar, multiecho pulse sequences of the brain and surrounding structures were obtained without intravenous contrast. Angiographic images of the head were obtained using MRA technique without contrast. COMPARISON:  MRI head 02/08/2020.  MRA head 09/10/2018 FINDINGS: MRI HEAD FINDINGS Brain: Interval development of small area of restricted diffusion in the right frontal parietal cortex over the convexity. This appears to be in the motor cortex accounting for the patient's left leg weakness. FLAIR imaging is normal in this area. No other areas of restricted diffusion Chronic infarcts in the right posterior frontal lobe and right occipital lobe unchanged. Mild white matter changes. Limited protocol per neurology. These results were called by telephone at the time of interpretation on 02/11/2020 at 8:30 am to provider [facility] , who verbally acknowledged these results. MRA HEAD FINDINGS Internal carotid artery widely patent bilaterally. Anterior cerebral arteries patent bilaterally. Left middle cerebral artery widely patent. Stent in the right M1 segment. Loss of flow related signal throughout the right middle cerebral artery occluding M1, M2, M3 branches as noted on the prior MRI a of 09/10/2018. Loss of signal may be due to stent related artifact versus occlusion. Both  vertebral arteries patent to the basilar. Right vertebral artery dominant. Basilar widely patent. Right AICA patent. Fetal origin left posterior cerebral artery. Patent right posterior communicating artery. Mild stenosis distal right posterior cerebral artery. High-grade stenosis versus occlusion left P3 segment, unchanged from the prior MRA. IMPRESSION: 1. Interval development of 5 mm area of acute infarct in the right frontal parietal cortex over the convexity. This appears to be in the motor cortex. FLAIR imaging normal in this region. 2.  Chronic infarct right frontal and right occipital lobe unchanged. 3. MRA demonstrates right M1 stent with loss of flow in the right middle cerebral artery unchanged from 2020 MRA. Possible occlusion versus artifact. 4. Bilateral PCA stenosis.  No change from 2020 Electronically Signed   By: Franchot Gallo [name]   On: 02/11/2020 08:43    MR BRAIN WO CONTRAST   Result Date: 02/12/2020 CLINICAL DATA:  Stroke, follow-up. EXAM: MRI HEAD WITHOUT CONTRAST TECHNIQUE: Multiplanar, multiecho pulse sequences of the brain and surrounding structures were obtained without intravenous contrast. COMPARISON:  MRI/MRA head 02/11/2020, noncontrast head CT 02/11/2020, brain MRI 02/08/2020. FINDINGS: Brain: Mild intermittent motion degradation. Mild cerebral and cerebellar atrophy. Two unchanged 4 mm foci of restricted diffusion within the posterior right frontal lobe compatible with acute/early subacute infarction. One of these infarcts involve the motor strip. Since the prior MRI of 02/11/2020, there has been interval development of two foci of acute parenchymal hemorrhage within the paramedian mid and posterior right frontal lobe. The more posterior of the parenchymal hemorrhages measures 1.2 cm and has mild surrounding vasogenic edema. The focus of parenchymal hemorrhage more anteriorly measures 5 mm. Redemonstrated chronic cortically based infarcts within the posterior right frontal lobe and  right occipital lobe. Mild multifocal T2/FLAIR hyperintensity elsewhere within the cerebral white matter is nonspecific, but compatible with chronic small vessel ischemic disease. Small chronic infarct within the left cerebellum. As before, there is mild hemosiderin deposition associated with the chronic right occipital lobe infarct. Redemonstrated right periatrial and right occipital lobe chronic microhemorrhages. No extra-axial fluid collection. No midline shift. Partially empty sella turcica. Vascular: No appreciable change. Septi artifact arising from a stent within the M1 right middle cerebral artery. Skull and upper cervical spine: No focal marrow lesion. Sinuses/Orbits: Bilateral lens replacements. Visualized orbits show no acute finding. Mild ethmoid sinus mucosal thickening. Small right maxillary sinus mucous retention cyst. These results were called by telephone at the time of interpretation on 02/12/2020 at 9:44 am to provider Dr. Erlinda Hong, who verbally acknowledged these results. IMPRESSION: Interval development of two foci of acute parenchymal hemorrhage within the paramedian mid to posterior right frontal lobe, the largest measuring 1.2 cm with mild surrounding vasogenic edema. Two unchanged 4 mm acute/early subacute infarcts within the posterior right frontal lobe, one involving the motor strip. Otherwise stable non-contrast MRI appearance of the brain. Chronic cortically based infarcts within the right frontal and occipital lobes. Background mild generalized atrophy of the brain and chronic small vessel ischemic disease. Small chronic left cerebellar infarct. Electronically Signed   By: Kellie Simmering DO   On: 02/12/2020 09:45    MR BRAIN WO CONTRAST   Result Date: 02/11/2020 CLINICAL DATA:  Stroke.  Left leg weakness. EXAM: MRI HEAD WITHOUT CONTRAST MRA HEAD WITHOUT CONTRAST TECHNIQUE: Multiplanar, multiecho pulse sequences of the brain and surrounding structures were obtained without intravenous  contrast. Angiographic images of the head were obtained using MRA technique without contrast. COMPARISON:  MRI head 02/08/2020.  MRA head 09/10/2018 FINDINGS: MRI HEAD FINDINGS Brain: Interval development of small area of restricted diffusion in the right frontal parietal cortex over the convexity. This appears to be in the motor cortex accounting for the patient's left leg weakness. FLAIR imaging is normal in this area. No other areas of restricted diffusion Chronic infarcts in the right posterior frontal lobe and right occipital lobe unchanged. Mild white matter changes. Limited protocol per neurology. These results were called by telephone at the time of interpretation on 02/11/2020 at 8:30 am  to provider Lesleigh Noe , who verbally acknowledged these results. MRA HEAD FINDINGS Internal carotid artery widely patent bilaterally. Anterior cerebral arteries patent bilaterally. Left middle cerebral artery widely patent. Stent in the right M1 segment. Loss of flow related signal throughout the right middle cerebral artery occluding M1, M2, M3 branches as noted on the prior MRI a of 09/10/2018. Loss of signal may be due to stent related artifact versus occlusion. Both vertebral arteries patent to the basilar. Right vertebral artery dominant. Basilar widely patent. Right AICA patent. Fetal origin left posterior cerebral artery. Patent right posterior communicating artery. Mild stenosis distal right posterior cerebral artery. High-grade stenosis versus occlusion left P3 segment, unchanged from the prior MRA. IMPRESSION: 1. Interval development of 5 mm area of acute infarct in the right frontal parietal cortex over the convexity. This appears to be in the motor cortex. FLAIR imaging normal in this region. 2. Chronic infarct right frontal and right occipital lobe unchanged. 3. MRA demonstrates right M1 stent with loss of flow in the right middle cerebral artery unchanged from 2020 MRA. Possible occlusion versus artifact.  4. Bilateral PCA stenosis.  No change from 2020 Electronically Signed   By: Franchot Gallo [name]   On: 02/11/2020 08:43    CT HEAD CODE STROKE WO CONTRAST`   Result Date: 02/11/2020 CLINICAL DATA:  Code stroke.  80 year old female EXAM: CT HEAD WITHOUT CONTRAST TECHNIQUE: Contiguous axial images were obtained from the base of the skull through the vertex without intravenous contrast. COMPARISON:  Brain MRI and head CT 02/08/2020. FINDINGS: Brain: Stable encephalomalacia in the right middle frontal gyrus, right occipital lobe. Stable gray-white matter differentiation throughout the brain. No midline shift, ventriculomegaly, mass effect, evidence of mass lesion, intracranial hemorrhage or evidence of cortically based acute infarction. Vascular: Calcified atherosclerosis at the skull base. Right MCA M1 stent in stable position. No suspicious intracranial vascular hyperdensity. Skull: No acute osseous abnormality identified. Sinuses/Orbits: Visualized paranasal sinuses and mastoids are stable and well pneumatized. Other: Stable orbit and scalp soft tissues. ASPECTS [facility] Stroke Program Early CT Score) Total score (0-10 with 10 being normal): 10 (chronic right hemisphere encephalomalacia) IMPRESSION: 1. No acute or of all vein cortically based infarct identified. Acute intracranial hemorrhage. ASPECTS 10. 2. Right MCA M1 stent. Right middle frontal gyrus and occipital lobe encephalomalacia. 3. These results were communicated to Dr. Curly Shores at 7:49 am on 02/11/2020 by text page via the [facility] messaging system. Electronically Signed   By: Genevie Ann [name]   On: 02/11/2020 07:49         Assessment/Plan: Diagnosis: R ACA infarcts with L hemiparesis 1. Does the need for close, 24 hr/day medical supervision in concert with the patient's rehab needs make it unreasonable for this patient to be served in a less intensive setting? Yes 2. Co-Morbidities requiring supervision/potential complications: CKD 4, baseline Cr  2.5-2.9, HTN, CAD, RA, DM on Lantus at home, constipation, L CVA tenderness 3. Due to bladder management, bowel management, safety, skin/wound care, disease management, medication administration, pain management and patient education, does the patient require 24 hr/day rehab nursing? Yes 4. Does the patient require coordinated care of a physician, rehab nurse, therapy disciplines of PT and OT to address physical and functional deficits in the context of the above medical diagnosis(es)? Yes Addressing deficits in the following areas: balance, endurance, locomotion, strength, transferring, bowel/bladder control, bathing, dressing, feeding, grooming and toileting 5. Can the patient actively participate in an intensive therapy program of at least 3 hrs of therapy  per day at least 5 days per week? Yes 6. The potential for patient to make measurable gains while on inpatient rehab is good 7. Anticipated functional outcomes upon discharge from inpatient rehab are modified independent and supervision  with PT, modified independent and supervision with OT, supervision with SLP- put speech due to visual loss 8. Estimated rehab length of stay to reach the above functional goals is: ~ 2 weeks 9. Anticipated discharge destination: Home 10. Overall Rehab/Functional Prognosis: good   RECOMMENDATIONS: This patient's condition is appropriate for continued rehabilitative care in the following setting: CIR Patient has agreed to participate in recommended program. Potentially Note that insurance prior authorization may be required for reimbursement for recommended care.   Comment:  1. Pt has L CVA tenderness- I know it was last checked 1/22, however I suggest rechecking U/A and Cx due to strong L CVA tenderness- almost jumped for the "chandelier". 2. LBM likely Sunday per pt- good historian- suggest helping her have BM.  3. Will submit for inpt Rehab/CIR admission- will d/w admissions coordinators 4. Thank you for  this consult.        Lavon Paganini Angiulli, PA-C 02/12/2020      I have personally performed a face to face diagnostic evaluation of this patient and formulated the key components of the plan.  Additionally, I have personally reviewed laboratory data, imaging studies, as well as relevant notes and concur with the physician assistant's documentation above.

## 2020-02-17 NOTE — H&P (Signed)
Physical Medicine and Rehabilitation Admission H&P    Chief Complaint  Patient presents with  . Stroke with functional deficits    HPI: [name] is an 80 year old female with history of T2DM with peripheral neuropathy and retinopathy, RA, CKD--stage III/IV (Dr. Hollie Salk), CVA, CAD/AS s/p TVAR who was admitted on 02/08/20 with reports of headache, left> right sided weakness and difficulty walking. MRI brain negative. On 01/25, she developed sudden onset of LLE numbness with left sided weakness. MRI/MRA brain repeated 01/25 and revealed interval development of 31m acute infarct in right frontal parietal motor cortex, chronic right frontal and right occipital infarcts, bilateral PCA stenosis and loss of flow in R-M1 stent --unchanged since 2020.  She received tPA and follow up MRI showed two foci of acute parenchymal hemorrhage in mid to posterior right frontal lobe with mild surrounding edema and 2 unchanged acute/subacute right frontal lobe infarcts.    Prior 2D echo 12/2019 EF 56% with moderate concentric hypertrophy and well seated aortic valve. TEE not needed. All antiplatelets held due to hemorrhagic conversion and with question plans to start ASA today. Question transient episode of A fib on 01/30--Loop recorder placed today for work up of cryptogenic stroke--need to keep incision C/D for 3 days. Therapy ongoing and patient continues to be limited by Left sided weakness affecting ability to carry out ADLs and mobility. Admitted to CIR 02/17/20 with functional decline, cough, impaired vision, joint pain, sensory change, and focal weakness.    Review of Systems  Constitutional: Negative for chills and fever.  HENT: Negative for ear pain, hearing loss and tinnitus.   Eyes: Negative for blurred vision.       Minimal vision in left eye due to retionopathy.   Respiratory: Positive for cough (chronic).   Cardiovascular: Negative for chest pain and palpitations.  Gastrointestinal: Negative  for constipation and heartburn.  Genitourinary: Negative for dysuria and urgency.  Musculoskeletal: Positive for back pain (LBP) and joint pain (rigth hip pain).  Neurological: Positive for sensory change and focal weakness.  Psychiatric/Behavioral: Negative for memory loss.      Past Medical History:  Diagnosis Date  . Arthritis   . Cerebrovascular disease   . CKD (chronic kidney disease)    Sees Dr UHollie Salk . Coronary artery disease   . Depression   . Diabetes ([place]    INSULIN DEPENDENT  . Diabetic peripheral neuropathy ([place]   . Diabetic retinopathy ([place]   . Diverticulitis   . GERD (gastroesophageal reflux disease)   . History of CVA (cerebrovascular accident)    x 2 no residulal  . Hyperlipidemia   . Hypertension   . Obesity   . Renal lesion   . 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 OwRoxy Mannsnd Dr. McAngelena Form. Severe aortic stenosis     Past Surgical History:  Procedure Laterality Date  . ABDOMINAL HYSTERECTOMY    . AV FISTULA PLACEMENT Left 12/18/2017   Procedure: ARTERIOVENOUS (AV) FISTULA CREATION ARM;  Surgeon: ClMarty HeckMD;  [facility];  Laterality: Left;  . bilateral cataract surgery    . CARDIAC CATHETERIZATION  01/07/2014   DR [place]  02/1999  . COLONOSCOPY    . COLOSTOMY  02/1999  . COLOSTOMY CLOSURE  07/1999  . EYE SURGERY Left 2019  . FISTULA SUPERFICIALIZATION Left 06/29/2018   Procedure: FISTULA  SUPERFICIALIZATION LEFT BRACHIOCEPHALIC;  Surgeon: Marty Heck, MD;  Location: [place];  Service: Vascular;  Laterality: Left;  . LEFT HEART CATHETERIZATION WITH CORONARY ANGIOGRAM N/A 01/07/2014   Procedure: LEFT HEART CATHETERIZATION WITH CORONARY ANGIOGRAM;  Surgeon: Laverda Page, MD;  Location: [facility] CATH LAB;  Service: Cardiovascular;  Laterality: N/A;  . middle cerebral artery stent placement Right   . OTHER  SURGICAL HISTORY     laser surgery  . PTCA  01/07/2014   DES to RCA    DR Einar Gip  . right knee surgery Right    for infection  . RIGHT/LEFT HEART CATH AND CORONARY ANGIOGRAPHY N/A 10/23/2018   Procedure: RIGHT/LEFT HEART CATH AND CORONARY ANGIOGRAPHY;  Surgeon: Nigel Mormon, MD;  Location: [place] CV LAB;  Service: Cardiovascular;  Laterality: N/A;  . TEE WITHOUT CARDIOVERSION N/A 11/13/2018   Procedure: TRANSESOPHAGEAL ECHOCARDIOGRAM (TEE);  Surgeon: Burnell Blanks, MD;  Location: [place];  Service: Open Heart Surgery;  Laterality: N/A;  . TRANSCATHETER AORTIC VALVE REPLACEMENT, TRANSFEMORAL  11/13/2018  . TRANSCATHETER AORTIC VALVE REPLACEMENT, TRANSFEMORAL N/A 11/13/2018   Procedure: TRANSCATHETER AORTIC VALVE REPLACEMENT, TRANSFEMORAL;  Surgeon: Burnell Blanks, MD;  Location: [place];  Service: Open Heart Surgery;  Laterality: N/A;    Family History  Problem Relation Age of Onset  . Diabetes Mother   . Heart disease Mother   . Prostate cancer Father   . Hypertension Brother   . Prostate cancer Brother     Social History:  Live alone. 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 She reports that she has never smoked. She has never used smokeless tobacco. She reports that she does not drink alcohol and does not use drugs.    Allergies: No Known Allergies    Medications Prior to Admission  Medication Sig Dispense Refill  . acetaminophen (TYLENOL) 325 MG tablet Take 650 mg by mouth every 6 (six) hours as needed for moderate pain or headache.     . allopurinol (ZYLOPRIM) 100 MG tablet Take 50 mg by mouth daily.    Marland Kitchen amLODipine (NORVASC) 10 MG tablet TAKE 1 TABLET BY MOUTH EVERY DAY (Patient taking differently: Take 10 mg by mouth daily.) 90 tablet 3  . amoxicillin (AMOXIL) 500 MG tablet Take 4 tablets (2,000 mg total) by mouth as directed. 30-60 min before dental procedure 4 tablet 6  . B-D INS SYR ULTRAFINE 1CC/30G 30G X 1/2" 1 ML  MISC Inject 1 each into the skin 3 (three) times daily between meals.    . calcitRIOL (ROCALTROL) 0.25 MCG capsule Take 0.25 mcg by mouth daily.     . cetirizine (ZYRTEC) 10 MG tablet Take 10 mg by mouth daily as needed for allergies.    . Desvenlafaxine ER 100 MG TB24 Take 100 mg by mouth daily.    . folic acid (FOLVITE) 1 MG tablet Take 1 mg by mouth daily.    Marland Kitchen HUMALOG KWIKPEN 100 UNIT/ML KwikPen Inject 2-8 Units into the skin 3 (three) times daily before meals.     . insulin glargine (LANTUS) 100 UNIT/ML injection Inject 13 Units into the skin at bedtime.    . isosorbide-hydrALAZINE (BIDIL) 20-37.5 MG tablet Take 2 tablets by mouth 3 (three) times daily. 540 tablet 2  . labetalol (NORMODYNE) 100 MG tablet Take 1 tablet (100 mg total) by mouth 2 (two) times daily. (Patient taking differently: Take 200 mg by mouth 2 (two) times daily.) 180 tablet 2  . leflunomide (ARAVA) 10 MG  tablet Take 10 mg by mouth daily.    . niacin (NIASPAN) 500 MG CR tablet Take 500 mg by mouth daily.     . nortriptyline (PAMELOR) 10 MG capsule Take 10 mg by mouth daily.    . ONE TOUCH ULTRA TEST test strip 1 each by Other route 3 (three) times daily between meals.    . polyvinyl alcohol (LIQUIFILM TEARS) 1.4 % ophthalmic solution Place 1 drop into both eyes 3 (three) times daily as needed for dry eyes.    . rosuvastatin (CRESTOR) 20 MG tablet Take 20 mg by mouth daily.    Marland Kitchen torsemide (DEMADEX) 20 MG tablet Take 1 tablet (20 mg total) by mouth daily. May take additional dose in the afternoon for swelling, shortness of breath or weight gain. (Patient taking differently: Take 40 mg by mouth daily. May take additional dose in the afternoon for swelling, shortness of breath or weight gain.) 180 tablet 0  . brimonidine (ALPHAGAN) 0.2 % ophthalmic solution  (Patient not taking: Reported on 02/08/2020)    . nitroGLYCERIN (NITROSTAT) 0.4 MG SL tablet PLACE 1 TABLET (0.4 MG TOTAL) UNDER THE TONGUE EVERY 5 MINUTES X 3 DOSES AS NEEDED  FOR CHEST PAIN. (Patient taking differently: Place 0.4 mg under the tongue every 5 (five) minutes as needed for chest pain.) 25 tablet 1    Drug Regimen Review  Drug regimen was reviewed and remains appropriate with no significant issues identified    Home: Home Living Family/patient expects to be discharged to:: Private residence Living Arrangements: Alone Available Help at Discharge: Family,Available 24 hours/day Type of Home: House Home Access: Stairs to enter [employer] of Steps: 2 Entrance Stairs-Rails: None Home Layout: Two level Alternate Level Stairs-Number of Steps: flight Alternate Level Stairs-Rails: Left,Right Bathroom Shower/Tub: Multimedia programmer: Standard Home Equipment: Shower seat,Cane - single point   Functional History: Prior Function Level of Independence: Independent  Functional Status:  Mobility: Bed Mobility Overal bed mobility: Needs Assistance Bed Mobility: Supine to Sit Supine to sit: Supervision Sit to supine: Min guard General bed mobility comments: increased time to elevate LLE onto bed Transfers Overall transfer level: Needs assistance Equipment used: Rolling walker (2 wheeled) Transfers: Sit to/from Stand Sit to Stand: Min guard Stand pivot transfers: Min guard General transfer comment: cues for hand placement Ambulation/Gait Ambulation/Gait assistance: Min assist,Min guard Gait Distance (Feet): 80 Feet Assistive device: Rolling walker (2 wheeled) Gait Pattern/deviations: Step-to pattern General Gait Details: pt with slowed step-to gait, reduced foot clearance of LLE. PT cues to increased ankle DF and hip/knee flexion Gait velocity: reduced Gait velocity interpretation: <1.8 ft/sec, indicate of risk for recurrent falls  ADL: ADL Overall ADL's : Needs assistance/impaired Eating/Feeding: Set up,Sitting Grooming: Set up,Sitting Upper Body Dressing : Minimal assistance,Sitting Lower Body Dressing: Moderate  assistance,Sitting/lateral leans Functional mobility during ADLs: Minimal assistance,Rolling walker General ADL Comments: multiple LOB backwards  Cognition: Cognition Overall Cognitive Status: Within Functional Limits for tasks assessed Orientation Level: Oriented X4 Cognition Arousal/Alertness: Awake/alert Behavior During Therapy: WFL for tasks assessed/performed Overall Cognitive Status: Within Functional Limits for tasks assessed Area of Impairment: Problem solving Problem Solving: Slow processing General Comments: Appears WFL for basic mobility tasks, Follows commands well. Able to recall conversation with CIR coordinator earlier.   Blood pressure (!) 130/51, pulse 70, temperature 98.1 F (36.7 C), resp. rate 18, weight 71 kg, SpO2 99 %. Physical Exam General: Alert, No apparent distress HEENT: Head is normocephalic, left upper quadrantanopia Neck: Supple without JVD or lymphadenopathy Heart:  Reg rate and rhythm. +murmur Chest: CTA bilaterally without wheezes, rales, or rhonchi; no distress Abdomen: Soft, non-tender, non-distended, bowel sounds positive. Extremities: No clubbing, cyanosis, or edema. Pulses are 2+ Psych: Pt's affect is appropriate. Pt is cooperative Skin: Clean and intact without signs of breakdown Neuro: Pt is cognitively appropriate with normal insight, memory, and awareness. Cranial nerves 2-12 are intact. Sensory exam is normal.  Fine motor coordination is intact. No tremors. Motor function is grossly 5/5 except for 4/5 strength in LUE SA, EE, EF Musculoskeletal: Positive thrill LUE graft. No edema.       Results for orders placed or performed during the hospital encounter of 02/08/20 (from the past 48 hour(s))  Glucose, capillary     Status: Abnormal   Collection Time: 02/15/20  9:12 PM  Result Value Ref Range   Glucose-Capillary 156 (H) 70 - 99 mg/dL    Comment: Glucose reference range applies only to samples taken after fasting for at least 8 hours.   Glucose, capillary     Status: Abnormal   Collection Time: 02/16/20  6:40 AM  Result Value Ref Range   Glucose-Capillary 138 (H) 70 - 99 mg/dL    Comment: Glucose reference range applies only to samples taken after fasting for at least 8 hours.  Glucose, capillary     Status: Abnormal   Collection Time: 02/16/20  7:50 AM  Result Value Ref Range   Glucose-Capillary 131 (H) 70 - 99 mg/dL    Comment: Glucose reference range applies only to samples taken after fasting for at least 8 hours.   Comment 1 Notify RN    Comment 2 Document in Chart   Glucose, capillary     Status: Abnormal   Collection Time: 02/16/20 12:23 PM  Result Value Ref Range   Glucose-Capillary 112 (H) 70 - 99 mg/dL    Comment: Glucose reference range applies only to samples taken after fasting for at least 8 hours.   Comment 1 Notify RN    Comment 2 Document in Chart   Glucose, capillary     Status: Abnormal   Collection Time: 02/16/20  4:55 PM  Result Value Ref Range   Glucose-Capillary 181 (H) 70 - 99 mg/dL    Comment: Glucose reference range applies only to samples taken after fasting for at least 8 hours.   Comment 1 Notify RN    Comment 2 Document in Chart   Glucose, capillary     Status: Abnormal   Collection Time: 02/16/20  9:15 PM  Result Value Ref Range   Glucose-Capillary 140 (H) 70 - 99 mg/dL    Comment: Glucose reference range applies only to samples taken after fasting for at least 8 hours.  CBC     Status: Abnormal   Collection Time: 02/17/20  1:49 AM  Result Value Ref Range   WBC 11.4 (H) 4.0 - 10.5 K/uL   RBC 3.60 (L) 3.87 - 5.11 MIL/uL   Hemoglobin 10.8 (L) 12.0 - 15.0 g/dL   HCT 32.1 (L) 36.0 - 46.0 %   MCV 89.2 80.0 - 100.0 fL   MCH 30.0 26.0 - 34.0 pg   MCHC 33.6 30.0 - 36.0 g/dL   RDW 13.2 11.5 - 15.5 %   Platelets 169 150 - 400 K/uL   nRBC 0.0 0.0 - 0.2 %    Comment: Performed at [facility], [place] [address], [place], [place] [id number]  Basic metabolic panel     Status:  Abnormal   Collection  Time: 02/17/20  1:49 AM  Result Value Ref Range   Sodium 135 135 - 145 mmol/L   Potassium 3.7 3.5 - 5.1 mmol/L   Chloride 103 98 - 111 mmol/L   CO2 21 (L) 22 - 32 mmol/L   Glucose, Bld 122 (H) 70 - 99 mg/dL    Comment: Glucose reference range applies only to samples taken after fasting for at least 8 hours.   BUN 41 (H) 8 - 23 mg/dL   Creatinine, Ser 2.60 (H) 0.44 - 1.00 mg/dL   Calcium 9.3 8.9 - 10.3 mg/dL   GFR, Estimated 18 (L) >60 mL/min    Comment: (NOTE) Calculated using the CKD-EPI Creatinine Equation (2021)    Anion gap 11 5 - 15    Comment: Performed at [facility] [address] [place], [place] [address]  Glucose, capillary     Status: Abnormal   Collection Time: 02/17/20  6:26 AM  Result Value Ref Range   Glucose-Capillary 125 (H) 70 - 99 mg/dL    Comment: Glucose reference range applies only to samples taken after fasting for at least 8 hours.  Glucose, capillary     Status: Abnormal   Collection Time: 02/17/20  8:05 AM  Result Value Ref Range   Glucose-Capillary 123 (H) 70 - 99 mg/dL    Comment: Glucose reference range applies only to samples taken after fasting for at least 8 hours.  Glucose, capillary     Status: Abnormal   Collection Time: 02/17/20  8:47 AM  Result Value Ref Range   Glucose-Capillary 122 (H) 70 - 99 mg/dL    Comment: Glucose reference range applies only to samples taken after fasting for at least 8 hours.  Glucose, capillary     Status: Abnormal   Collection Time: 02/17/20  1:03 PM  Result Value Ref Range   Glucose-Capillary 160 (H) 70 - 99 mg/dL    Comment: Glucose reference range applies only to samples taken after fasting for at least 8 hours.  Glucose, capillary     Status: None   Collection Time: 02/17/20  5:06 PM  Result Value Ref Range   Glucose-Capillary 70 70 - 99 mg/dL    Comment: Glucose reference range applies only to samples taken after fasting for at least 8 hours.   No results  found.     Medical Problem List and Plan: 1.  Impaired mobility and ADLs secondary to right ACA infarcts  -patient may shower  -ELOS/Goals: 14-16 days modI 2.  Antithrombotics: -DVT/anticoagulation:  Mechanical: Sequential compression devices, below knee Bilateral lower extremities  -antiplatelet therapy: Transient thrombocytopenia has resolved. ASA to start? 3. Pain Management: tylenol prn for back/right hip pain.  4. Mood: LCSW to follow for evaluation and support.   -antipsychotic agents: N/A 5. Neuropsych: This patient is capable of making decisions on her own behalf. 6. Skin/Wound Care: Routine pressure relief measures  7. Fluids/Electrolytes/Nutrition: Monitor I/O. Check lytes in am.  8. CAD/TVAR: Followed by Dr. Virgina Jock. Plavix was d/c due to bleed. Continue Imdur, Crestor and Demadex. 9. HTN: Monitor BP tid--continue Amlodipine, Demadex, Hydralazine and Imdur.  9. T2DM with neuropathy/retionopathy: Hgb A1c- 7.7.CBGs ranging 70-160.    Continue Lantus 10 units with SSI for elevated BS. Will monitor BS ac/hs  10. CKD III/IV: BUN/SCr  30/2.44  at admission -->37/2.91-->41/2.60. Encourage fluid intake--check BMET in am.   --May need to hold diuretic for 1-2 days if worse.  Weight down from 73.9-->67.3 kg. Will monitor weight daily for  trend.  11. Leucocytosis: WBC up to 11.4 today--will monitor for fevers and other signs of infection.   Bary Leriche, PA-C  I have personally performed a face to face diagnostic evaluation, including, but not limited to relevant history and physical exam findings, of this patient and developed relevant assessment and plan.  Additionally, I have reviewed and concur with the physician assistant's documentation above.  Leeroy Cha, MD

## 2020-02-18 ENCOUNTER — Encounter (HOSPITAL_COMMUNITY): Payer: Self-pay | Admitting: Internal Medicine

## 2020-02-18 DIAGNOSIS — I639 Cerebral infarction, unspecified: Secondary | ICD-10-CM | POA: Diagnosis not present

## 2020-02-18 LAB — CBC WITH DIFFERENTIAL/PLATELET
Abs Immature Granulocytes: 0.02 10*3/uL (ref 0.00–0.07)
Basophils Absolute: 0 10*3/uL (ref 0.0–0.1)
Basophils Relative: 1 %
Eosinophils Absolute: 0.1 10*3/uL (ref 0.0–0.5)
Eosinophils Relative: 1 %
HCT: 30.8 % — ABNORMAL LOW (ref 36.0–46.0)
Hemoglobin: 10.4 g/dL — ABNORMAL LOW (ref 12.0–15.0)
Immature Granulocytes: 0 %
Lymphocytes Relative: 25 %
Lymphs Abs: 2.2 10*3/uL (ref 0.7–4.0)
MCH: 30 pg (ref 26.0–34.0)
MCHC: 33.8 g/dL (ref 30.0–36.0)
MCV: 88.8 fL (ref 80.0–100.0)
Monocytes Absolute: 1.2 10*3/uL — ABNORMAL HIGH (ref 0.1–1.0)
Monocytes Relative: 14 %
Neutro Abs: 5 10*3/uL (ref 1.7–7.7)
Neutrophils Relative %: 59 %
Platelets: 173 10*3/uL (ref 150–400)
RBC: 3.47 MIL/uL — ABNORMAL LOW (ref 3.87–5.11)
RDW: 13.3 % (ref 11.5–15.5)
WBC: 8.5 10*3/uL (ref 4.0–10.5)
nRBC: 0 % (ref 0.0–0.2)

## 2020-02-18 LAB — COMPREHENSIVE METABOLIC PANEL
ALT: 9 U/L (ref 0–44)
AST: 14 U/L — ABNORMAL LOW (ref 15–41)
Albumin: 2.5 g/dL — ABNORMAL LOW (ref 3.5–5.0)
Alkaline Phosphatase: 75 U/L (ref 38–126)
Anion gap: 12 (ref 5–15)
BUN: 43 mg/dL — ABNORMAL HIGH (ref 8–23)
CO2: 21 mmol/L — ABNORMAL LOW (ref 22–32)
Calcium: 9 mg/dL (ref 8.9–10.3)
Chloride: 104 mmol/L (ref 98–111)
Creatinine, Ser: 2.53 mg/dL — ABNORMAL HIGH (ref 0.44–1.00)
GFR, Estimated: 19 mL/min — ABNORMAL LOW (ref >60)
Glucose, Bld: 97 mg/dL (ref 70–99)
Potassium: 3.4 mmol/L — ABNORMAL LOW (ref 3.5–5.1)
Sodium: 137 mmol/L (ref 135–145)
Total Bilirubin: 0.7 mg/dL (ref 0.3–1.2)
Total Protein: 5.5 g/dL — ABNORMAL LOW (ref 6.5–8.1)

## 2020-02-18 LAB — GLUCOSE, CAPILLARY
Glucose-Capillary: 98 mg/dL (ref 70–99)
Glucose-Capillary: 246 mg/dL — ABNORMAL HIGH (ref 70–99)
Glucose-Capillary: 157 mg/dL — ABNORMAL HIGH (ref 70–99)
Glucose-Capillary: 131 mg/dL — ABNORMAL HIGH (ref 70–99)

## 2020-02-18 MED ORDER — ALLOPURINOL 100 MG PO TABS
50.0000 mg | ORAL_TABLET | Freq: Every day | ORAL | Status: DC
  Administered 2020-02-18 – 2020-02-26 (×9): 50 mg via ORAL
  Filled 2020-02-18 (×9): qty 0.5

## 2020-02-18 MED ORDER — EXERCISE FOR HEART AND HEALTH BOOK
Freq: Once | Status: AC
  Administered 2020-02-18: 1
  Filled 2020-02-18: qty 1

## 2020-02-18 MED ORDER — LIVING WELL WITH DIABETES BOOK
Freq: Once | Status: AC
  Administered 2020-02-18: 1
  Filled 2020-02-18: qty 1

## 2020-02-18 MED ORDER — BLOOD PRESSURE CONTROL BOOK
Freq: Once | Status: AC
  Administered 2020-02-18: 1
  Filled 2020-02-18: qty 1

## 2020-02-18 MED ORDER — LORATADINE 10 MG PO TABS
10.0000 mg | ORAL_TABLET | Freq: Every day | ORAL | Status: DC
  Administered 2020-02-18 – 2020-02-26 (×9): 10 mg via ORAL
  Filled 2020-02-18 (×9): qty 1

## 2020-02-18 MED ORDER — FLUTICASONE PROPIONATE 50 MCG/ACT NA SUSP
1.0000 | Freq: Every day | NASAL | Status: DC | PRN
  Filled 2020-02-18: qty 16

## 2020-02-18 MED ORDER — POTASSIUM CHLORIDE CRYS ER 10 MEQ PO TBCR
10.0000 meq | EXTENDED_RELEASE_TABLET | Freq: Every day | ORAL | Status: DC
  Administered 2020-02-18 – 2020-02-26 (×9): 10 meq via ORAL
  Filled 2020-02-18 (×9): qty 1

## 2020-02-18 NOTE — Progress Notes (Signed)
Physical Therapy Session Note  Patient Details  Name: [name] MRN: [id number] Date of Birth: [birth year]  Today's Date: 02/18/2020 PT Individual Time: 1400-1500 PT Individual Time Calculation (min): 60 min   Short Term Goals: Week 1:  PT Short Term Goal 1 (Week 1): STG = LTG d/t ELOS  Skilled Therapeutic Interventions/Progress Updates:  Ambulation/gait training;Balance/vestibular training;Community reintegration;Discharge planning;Disease management/prevention;DME/adaptive equipment instruction;Functional mobility training;Neuromuscular re-education;Patient/family education;Psychosocial support;Stair training;Therapeutic Activities;Therapeutic Exercise;UE/LE Strength taining/ROM;UE/LE Coordination activities;Visual/perceptual remediation/compensation;Wheelchair propulsion/positioning    Therapy Documentation Precautions:  Precautions Precautions: Fall,Other (comment) Precaution Comments: LLE weakness, L eye retinopathy, AV fistula L arm, new LOOP recorder placement Restrictions Weight Bearing Restrictions: No  Session 2 note: Patient seated upright in w/c upon PT arrival. Patient alert and agreeable to PT session. Patient denied pain during session.  Therapeutic Activity: Bed Mobility: Patient performed supine to/from sit with Min A for LLE. Provided verbal cues for technique. She is able to bridge with CGA and assists with positioning toward [facility] with CGA.  Transfers: Patient performed sit to/from stand throughout session with focus on safe hand progression, especially with descent to sit. Attempts for STS without push at armrests, with pt requiring Mod/ Max A for power up. Provided verbal cues for technique iin hand placement and progression.   Gait Training:  Patient ambulated 200' x1/ 135' x1 using RW with Min A and improving to SBA by end of session. Instructions provided during ambulating turns for stepping into BOS of walker and maintaining placement just ahead of body. Final  amb bout performed with focus on high knee/ marching step pattern for improved overall SL balance and L foot clearance during normal gait pattern. Pt also ambulated 100' including slow and wide 180 degree turn without use of AD. Pt demos intermittent balance instability and performs lateral step out to control balance and correct potential LOB. Provided verbal cues for upright posture, increased L hip/ knee flexion.    Neuromuscular Re-ed: Berg Balance test conducted and pt demonstrates increased fall risk as noted by score of   33/56 on Berg Balance Scale.  (<36= high risk for falls, close to 100%)  NMR facilitated during session with focus on dual task with static standing balance. Pt guided in visual scanning task using BITS. Pt requires intermittent use of walker for maintaining stance and completes 24mnutes of reaching activity with single targets covering entire field of screen.  She completes 57 targets in 240m with 77% accuracy with 2-3sec reaction time. NMR performed for improvements in motor control and coordination, balance, sequencing, judgement, and self confidence/ efficacy in performing all aspects of mobility at highest level of independence.   Patient fatigued at end of session and supine in bed with brakes locked, bed alarm set, and all needs within reach.   Therapy/Group: Individual Therapy  JuAlger SimonsT, DPT 02/18/2020, 4:52 PM

## 2020-02-18 NOTE — Progress Notes (Signed)
[facility] Individual Statement of Services  Patient Name:  [name]  Date:  02/18/2020  Welcome to the [facility].  Our goal is to provide you with an individualized program based on your diagnosis and situation, designed to meet your specific needs.  With this comprehensive rehabilitation program, you will be expected to participate in at least 3 hours of rehabilitation therapies Monday-Friday, with modified therapy programming on the weekends.  Your rehabilitation program will include the following services:  Physical Therapy (PT), Occupational Therapy (OT), Speech Therapy (ST), 24 hour per day rehabilitation nursing, Therapeutic Recreaction (TR), Neuropsychology, Care Coordinator, Rehabilitation Medicine, Nutrition Services, Pharmacy Services and Other  Weekly team conferences will be held on Wednesday to discuss your progress.  Your Inpatient Rehabilitation Care Coordinator will talk with you frequently to get your input and to update you on team discussions.  Team conferences with you and your family in attendance may also be held.  Expected length of stay: 14-16 Days  Overall anticipated outcome: MOD I/Supervision  Depending on your progress and recovery, your program may change. Your Inpatient Rehabilitation Care Coordinator will coordinate services and will keep you informed of any changes. Your Inpatient Rehabilitation Care Coordinator's name and contact numbers are listed  below.  The following services may also be recommended but are not provided by the [facility]:    [place] will be made to provide these services after discharge if needed.  Arrangements include referral to agencies that provide these services.  Your insurance has been verified to be:  [employer] Your primary doctor is:  Latanya Presser, MD  Pertinent  information will be shared with your doctor and your insurance company.  Inpatient Rehabilitation Care Coordinator:  Erlene Quan, [place] or [phone]  Information discussed with and copy given to patient by: Dyanne Iha, 02/18/2020, 11:18 AM

## 2020-02-18 NOTE — Plan of Care (Signed)
  Problem: RH Balance Goal: LTG Patient will maintain dynamic standing balance (PT) Description: LTG:  Patient will maintain dynamic standing balance with assistance during mobility activities (PT) Outcome: Progressing Flowsheets (Taken 02/18/2020 1802) LTG: Pt will maintain dynamic standing balance during mobility activities with:: Supervision/Verbal cueing   Problem: Sit to Stand Goal: LTG:  Patient will perform sit to stand with assistance level (PT) Description: LTG:  Patient will perform sit to stand with assistance level (PT) Outcome: Progressing Flowsheets (Taken 02/18/2020 1802) LTG: PT will perform sit to stand in preparation for functional mobility with assistance level: Independent with assistive device   Problem: RH Bed Mobility Goal: LTG Patient will perform bed mobility with assist (PT) Description: LTG: Patient will perform bed mobility with assistance, with/without cues (PT). Outcome: Progressing Flowsheets (Taken 02/18/2020 1802) LTG: Pt will perform bed mobility with assistance level of: Independent with assistive device    Problem: RH Bed to Chair Transfers Goal: LTG Patient will perform bed/chair transfers w/assist (PT) Description: LTG: Patient will perform bed to chair transfers with assistance (PT). Outcome: Progressing Flowsheets (Taken 02/18/2020 1802) LTG: Pt will perform Bed to Chair Transfers with assistance level: Supervision/Verbal cueing   Problem: RH Car Transfers Goal: LTG Patient will perform car transfers with assist (PT) Description: LTG: Patient will perform car transfers with assistance (PT). Outcome: Progressing Flowsheets (Taken 02/18/2020 1802) LTG: Pt will perform car transfers with assist:: Supervision/Verbal cueing   Problem: RH Ambulation Goal: LTG Patient will ambulate in controlled environment (PT) Description: LTG: Patient will ambulate in a controlled environment, # of feet with assistance (PT). Outcome: Progressing Flowsheets (Taken  02/18/2020 1802) LTG: Pt will ambulate in controlled environ  assist needed:: Supervision/Verbal cueing LTG: Ambulation distance in controlled environment: at least 200 feet using LRAD Goal: LTG Patient will ambulate in home environment (PT) Description: LTG: Patient will ambulate in home environment, # of feet with assistance (PT). Outcome: Progressing Flowsheets (Taken 02/18/2020 1802) LTG: Pt will ambulate in home environ  assist needed:: Supervision/Verbal cueing LTG: Ambulation distance in home environment: at least 50 feet using LRAD   Problem: RH Stairs Goal: LTG Patient will ambulate up and down stairs w/assist (PT) Description: LTG: Patient will ambulate up and down # of stairs with assistance (PT) Outcome: Progressing Flowsheets (Taken 02/18/2020 1802) LTG: Pt will ambulate up/down stairs assist needed:: Contact Guard/Touching assist LTG: Pt will  ambulate up and down number of stairs: at least 2 steps using HR setup as per home environment

## 2020-02-18 NOTE — Evaluation (Signed)
Occupational Therapy Assessment and Plan  Patient Details  Name: [name] MRN: [id number] Date of Birth: January 13, 1941  OT Diagnosis: abnormal posture, blindness and low vision, hemiplegia affecting non-dominant side, muscle weakness (generalized) and decreased activity tolerance Rehab Potential: Rehab Potential (ACUTE ONLY): Good ELOS: 9 to 12 days   Today's Date: 02/18/2020 OT Individual Time: 1102-1117 OT Individual Time Calculation (min): 59 min     Hospital Problem: Active Problems:   Ischemic stroke of frontal lobe [facility])   Past Medical History:  Past Medical History:  Diagnosis Date  . Arthritis   . Cerebrovascular disease   . CKD (chronic kidney disease)    Sees Dr Hollie Salk  . Coronary artery disease   . Depression   . Diabetes ([place])    INSULIN DEPENDENT  . Diabetic peripheral neuropathy ([place])   . Diabetic retinopathy ([place])   . Diverticulitis   . GERD (gastroesophageal reflux disease)   . History of CVA (cerebrovascular accident)    x 2 no residulal  . Hyperlipidemia   . Hypertension   . Obesity   . Renal lesion   . S/P TAVR (transcatheter aortic valve replacement) 11/13/2018   s/p TAVR with a 85m Edwards S3U via the TF approach by Dr. ORoxy Mannsand Dr. MAngelena Form . Severe aortic stenosis    Past Surgical History:  Past Surgical History:  Procedure Laterality Date  . ABDOMINAL HYSTERECTOMY    . AV FISTULA PLACEMENT Left 12/18/2017   Procedure: ARTERIOVENOUS (AV) FISTULA CREATION ARM;  Surgeon: CMarty Heck MD;  [facility]  Service: Vascular;  Laterality: Left;  . bilateral cataract surgery    . CARDIAC CATHETERIZATION  01/07/2014   [place]  02/1999  . COLONOSCOPY    . COLOSTOMY  02/1999  . COLOSTOMY CLOSURE  07/1999  . EYE SURGERY Left 2019  . FISTULA SUPERFICIALIZATION Left 06/29/2018   Procedure: FISTULA SUPERFICIALIZATION LEFT BRACHIOCEPHALIC;  Surgeon: CMarty Heck MD;  Location: [facility]  Service: Vascular;   Laterality: Left;  . LEFT HEART CATHETERIZATION WITH CORONARY ANGIOGRAM N/A 01/07/2014   Procedure: LEFT HEART CATHETERIZATION WITH CORONARY ANGIOGRAM;  Surgeon: JLaverda Page MD;  [facility];  Service: Cardiovascular;  Laterality: N/A;  . LOOP RECORDER INSERTION N/A 02/17/2020   Procedure: LOOP RECORDER INSERTION;  Surgeon: KDeboraha Sprang MD;  [facility];  Service: Cardiovascular;  Laterality: N/A;  . middle cerebral artery stent placement Right   . OTHER SURGICAL HISTORY     laser surgery  . PTCA  01/07/2014   DES to RCA    DR GEinar Gip . right knee surgery Right    for infection  . RIGHT/LEFT HEART CATH AND CORONARY ANGIOGRAPHY N/A 10/23/2018   Procedure: RIGHT/LEFT HEART CATH AND CORONARY ANGIOGRAPHY;  Surgeon: PNigel Mormon MD;  [facility];  Service: Cardiovascular;  Laterality: N/A;  . TEE WITHOUT CARDIOVERSION N/A 11/13/2018   Procedure: TRANSESOPHAGEAL ECHOCARDIOGRAM (TEE);  Surgeon: MBurnell Blanks MD;  Location: [facility]  Service: Open Heart Surgery;  Laterality: N/A;  . TRANSCATHETER AORTIC VALVE REPLACEMENT, TRANSFEMORAL  11/13/2018  . TRANSCATHETER AORTIC VALVE REPLACEMENT, TRANSFEMORAL N/A 11/13/2018   Procedure: TRANSCATHETER AORTIC VALVE REPLACEMENT, TRANSFEMORAL;  Surgeon: MBurnell Blanks MD;  [facility]  Service: Open Heart Surgery;  Laterality: N/A;    Assessment & Plan Clinical Impression: Patient is a 80y.o. year old female with history of T2DM with peripheral neuropathy and retinopathy, RA,  CKD--stage III/IV (Dr. Hollie Salk), CVA, CAD/AS s/p TVAR who was admitted on 02/08/20 with reports of headache, left> right sided weakness and difficulty walking. MRI brain negative. On 01/25, she developed sudden onset of LLE numbness with left sided weakness. MRI/MRA brain repeated 01/25 and revealed interval development of 76m acute infarct in right frontal parietal motor cortex, chronic right frontal and  right occipital infarcts, bilateral PCA stenosis and loss of flow in R-M1 stent --unchanged since 2020.  She received tPA and follow up MRI showed two foci of acute parenchymal hemorrhage in mid to posterior right frontal lobe with mild surrounding edema and 2 unchanged acute/subacute right frontal lobe infarcts.    Prior 2D echo 12/2019 EF 56% with moderate concentric hypertrophy and well seated aortic valve. TEE not needed. All antiplatelets held due to hemorrhagic conversion and with question plans to start ASA today. Question transient episode of A fib on 01/30--Loop recorder placed today for work up of cryptogenic stroke--need to keep incision C/D for 3 days. Therapy ongoing and patient continues to be limited by Left sided weakness affecting ability to carry out ADLs and mobility. Admitted to CIR 02/17/20 with functional decline, cough, impaired vision, joint pain, sensory change, and focal weakness. .  Patient transferred to CIR on 02/17/2020 .    Patient currently requires min with basic self-care skills secondary to muscle weakness, decreased cardiorespiratoy endurance, impaired timing and sequencing, abnormal tone, unbalanced muscle activation, motor apraxia, decreased coordination and decreased motor planning and field cut.  Prior to hospitalization, patient could complete BADL/IADL with modified independent .  Patient will benefit from skilled intervention to increase independence with basic self-care skills prior to discharge home with care partner.  Anticipate patient will require intermittent supervision and follow up home health.  OT - End of Session Activity Tolerance: Tolerates 30+ min activity with multiple rests Endurance Deficit: Yes Endurance Deficit Description: Req increased time/freq rest breaks throughout ADL OT Assessment Rehab Potential (ACUTE ONLY): Good OT Patient demonstrates impairments in the following area(s): Balance;Endurance;Motor;Vision OT Basic ADL's Functional  Problem(s): Grooming;Bathing;Dressing;Toileting;Eating OT Transfers Functional Problem(s): Toilet;Tub/Shower OT Additional Impairment(s): Fuctional Use of Upper Extremity OT Plan OT Intensity: Minimum of 1-2 x/day, 45 to 90 minutes OT Frequency: 5 out of 7 days OT Duration/Estimated Length of Stay: 9 to 12 days OT Treatment/Interventions: Balance/vestibular training;Disease mangement/prevention;Neuromuscular re-education;Self Care/advanced ADL retraining;Therapeutic Exercise;UE/LE Strength taining/ROM;Wheelchair propulsion/positioning;Skin care/wound managment;Pain management;DME/adaptive equipment instruction;Community reintegration;Patient/family education;UE/LE Coordination activities;Visual/perceptual remediation/compensation;Therapeutic Activities;Psychosocial support;Functional mobility training;Discharge planning OT Self Feeding Anticipated Outcome(s): mod I OT Basic Self-Care Anticipated Outcome(s): mod I OT Toileting Anticipated Outcome(s): mod I OT Bathroom Transfers Anticipated Outcome(s): supervision OT Recommendation Patient destination: Home Follow Up Recommendations: Home health OT Equipment Recommended: Tub/shower bench;3 in 1 bedside comode   OT Evaluation Precautions/Restrictions  Precautions Precautions: Fall;Other (comment) Precaution Comments: LLE weakness, L eye retinopathy, AV fistula L arm, new LOOP recorder placement Restrictions Weight Bearing Restrictions: No General Chart Reviewed: Yes Family/Caregiver Present: No Vital Signs Therapy Vitals Pulse Rate: (!) 56 BP: (!) 156/53 Pain Pain Assessment Pain Scale: 0-10 Pain Score: 0-No pain Home Living/Prior Functioning Home Living Family/patient expects to be discharged to:: Private residence Living Arrangements: Other relatives (Lives with brother) Available Help at Discharge: Family,Available 24 hours/day Type of Home: House Home Access: Stairs to enter ETechnical brewerof Steps: 2 Entrance  Stairs-Rails: None Home Layout: Two level Alternate Level Stairs-Number of Steps: flight Alternate Level Stairs-Rails: Left,Right Bathroom Shower/Tub: Tub/shower unit (brother's house) Bathroom Toilet: Standard Additional Comments: Pt may be going to  brother's home on D/C. One level home, 1 STE no rail, and tub shower for pt's bathroom.  Lives With: Alone IADL History Homemaking Responsibilities: Yes Meal Prep Responsibility: Primary Laundry Responsibility: Primary Cleaning Responsibility: Primary Bill Paying/Finance Responsibility: Primary Shopping Responsibility: Primary Occupation: Psychologist, occupational work Leisure and Hobbies: enjoys watching TV, Psychologist, occupational work for [employer], doing chores around the house Prior Function Level of Independence: Independent with basic ADLs,Independent with homemaking with ambulation,Independent with gait,Independent with transfers  Able to Take Stairs?: Yes Driving: Yes Vocation: Retired Biomedical scientist: Retired Surveyor, quantity for [employer]: Hobbies-yes (Comment) Comments: volunteers for 2 hours each Tues and Thu making phone calls from home for Blanket Vision/History: Wears glasses Wears Glasses: Reading only Patient Visual Report: No change from baseline Vision Assessment?: Yes Eye Alignment: Within Functional Limits Ocular Range of Motion: Within Functional Limits Alignment/Gaze Preference: Within Defined Limits Tracking/Visual Pursuits: Able to track stimulus in all quads without difficulty Saccades: Decreased speed of saccadic movement;Additional eye shifts occurred during testing Convergence: Within functional limits Visual Fields: Left visual field deficit Perception  Perception: Within Functional Limits Praxis Praxis: Intact Cognition Overall Cognitive Status: Within Functional Limits for tasks assessed Arousal/Alertness: Awake/alert Orientation Level: Person;Situation Year:  2022 Month: January Day of Week: Correct Memory: Appears intact Immediate Memory Recall: Sock;Blue;Bed Memory Recall Sock: Without Cue Memory Recall Blue: Without Cue Memory Recall Bed: Without Cue Attention: Focused;Sustained Focused Attention: Appears intact Sustained Attention: Appears intact Awareness: Appears intact Problem Solving: Impaired Problem Solving Impairment: Functional complex Safety/Judgment: Appears intact Sensation Sensation Light Touch: Appears Intact Hot/Cold: Appears Intact Proprioception: Appears Intact Stereognosis: Appears Intact Coordination Gross Motor Movements are Fluid and Coordinated: No Fine Motor Movements are Fluid and Coordinated: No Coordination and Movement Description: LLE slow to initiate in functional transfers, requires extra time for motor planning; decreased L thumb-digit opposition speed Motor  Motor Motor: Hemiplegia Motor - Skilled Clinical Observations: L hemiparesis and slow to motor plan  Trunk/Postural Assessment  Cervical Assessment Cervical Assessment: Exceptions to [facility] (forward head) Thoracic Assessment Thoracic Assessment: Exceptions to [facility] (rounded shoulders, L lower than R) Lumbar Assessment Lumbar Assessment: Exceptions to [facility] (posterior pelvic rotation) Postural Control Postural Control: Within Functional Limits  Balance Balance Balance Assessed: Yes Standardized Balance Assessment Standardized Balance Assessment: Timed Up and Go Test Timed Up and Go Test TUG: Normal TUG Normal TUG (seconds): 1.66 (avg of 55mn 36sec, 127m 46sec, 61m110m15sec.) Static Sitting Balance Static Sitting - Balance Support: Feet supported Static Sitting - Level of Assistance: 5: Stand by assistance Dynamic Sitting Balance Dynamic Sitting - Balance Support: Feet supported;During functional activity;Bilateral upper extremity supported Dynamic Sitting - Level of Assistance: 5: Stand by assistance Dynamic Sitting - Balance Activities:  Lateral lean/weight shifting;Forward lean/weight shifting;Reaching for objects;Reaching across midline Sitting balance - Comments: min lateral lean Left, CGA for safety. Static Standing Balance Static Standing - Balance Support: During functional activity;Bilateral upper extremity supported;No upper extremity supported Static Standing - Level of Assistance: 4: Min assist Dynamic Standing Balance Dynamic Standing - Balance Support: Bilateral upper extremity supported;During functional activity Dynamic Standing - Level of Assistance: 4: Min assist Dynamic Standing - Balance Activities: Lateral lean/weight shifting;Forward lean/weight shifting;Reaching for objects;Reaching across midline Extremity/Trunk Assessment RUE Assessment RUE Assessment: Within Functional Limits General Strength Comments: 5/5 shoulder flexion, grip strength LUE Assessment LUE Assessment: Exceptions to WFLIntegris Deaconesstive Range of Motion (AROM) Comments: 3/4 to full shoulder flexion, elbox extension/flexion General Strength Comments: 4/5 in shoulder flexion, 3+/5 grip strength LUE Body  System: Neuro Brunstrum levels for arm and hand: Arm;Hand Brunstrum level for arm: Stage V Relative Independence from Synergy Brunstrum level for hand: Stage V Independence from basic synergies  Care Tool Care Tool Self Care Eating   Eating Assist Level: Supervision/Verbal cueing (seated unsupported)    Oral Care    Oral Care Assist Level: Supervision/Verbal cueing (seated unsupported)    Bathing   Body parts bathed by patient: Right arm;Face;Left arm;Chest;Front perineal area;Abdomen;Buttocks;Right upper leg;Left upper leg;Right lower leg;Left lower leg     Assist Level: Minimal Assistance - Patient > 75%    Upper Body Dressing(including orthotics)   What is the patient wearing?: Pull over shirt   Assist Level: Supervision/Verbal cueing    Lower Body Dressing (excluding footwear)   What is the patient wearing?: Incontinence  brief;Underwear/pull up;Pants Assist for lower body dressing: Minimal Assistance - Patient > 75%    Putting on/Taking off footwear   What is the patient wearing?: Non-skid slipper socks Assist for footwear: Contact Guard/Touching assist       Care Tool Toileting Toileting activity   Assist for toileting: Minimal Assistance - Patient > 75%     Care Tool Bed Mobility Roll left and right activity   Roll left and right assist level: Contact Guard/Touching assist    Sit to lying activity   Sit to lying assist level: Minimal Assistance - Patient > 75%    Lying to sitting edge of bed activity   Lying to sitting edge of bed assist level: Contact Guard/Touching assist     Care Tool Transfers Sit to stand transfer   Sit to stand assist level: Minimal Assistance - Patient > 75%    Chair/bed transfer   Chair/bed transfer assist level: Minimal Assistance - Patient > 75%     Toilet transfer   Assist Level: Minimal Assistance - Patient > 75% (stand-pivot no AD)     Care Tool Cognition Expression of Ideas and Wants Expression of Ideas and Wants: Without difficulty (complex and basic) - expresses complex messages without difficulty and with speech that is clear and easy to understand   Understanding Verbal and Non-Verbal Content Understanding Verbal and Non-Verbal Content: Understands (complex and basic) - clear comprehension without cues or repetitions   Memory/Recall Ability *first 3 days only Memory/Recall Ability *first 3 days only: Current season;Location of own room;That he or she is in a hospital/hospital unit;Staff names and faces    Refer to Care Plan for [place] 1 OT Short Term Goal 1 (Week 1): STG = LTG 2/2 ELOS  Recommendations for other services: None    Skilled Therapeutic Intervention ADL ADL Eating: Supervision/safety Where Assessed-Eating: Edge of bed Grooming: Supervision/safety Where Assessed-Grooming: Edge of bed Upper Body  Bathing: Supervision/safety Where Assessed-Upper Body Bathing: Edge of bed Lower Body Bathing: Minimal assistance Where Assessed-Lower Body Bathing: Edge of bed Upper Body Dressing: Supervision/safety Where Assessed-Upper Body Dressing: Edge of bed Lower Body Dressing: Minimal assistance Where Assessed-Lower Body Dressing: Edge of bed Toileting: Minimal assistance Where Assessed-Toileting: Glass blower/designer: Psychiatric nurse Method: Arts development officer: Extra wide Radiographer, therapeutic Method: Psychologist, educational: Recruitment consultant: Manufacturing systems engineer  Bed Mobility Bed Mobility: Supine to Sit;Rolling Right Rolling Right: Supervision/verbal cueing Supine to Sit: Supervision/Verbal cueing Sitting - Scoot to [employer] of Bed: Contact Guard/Touching assist (BUE support) Sit to Supine: Minimal Assistance - Patient > 75% Transfers Sit to Stand:  Minimal Assistance - Patient > 75% Stand to Sit: Minimal Assistance - Patient > 75%  Session Note: Pt received semi-reclined in bed, agreeable to OT eval. Reviewed role of CIR OT, evaluation process, ADL/func mobility retraining, goals for therapy, and safety plan. Evaluation completed as documented above with session focus on EOB bathing, dressing, and toileting. Pt completes bed mobility with close S, intermittent CGA for dynamic sitting balance EOB 2/2 minimal L lean. Doffed hospital gown, donned new shirt with close S. Doffed/donned underwear/pants with min A for STS. Bathed UB with close SB, LB with min A for STS to reach buttocks. Stand-pivot bed<>w/c<>toilet with min A for initial lift + balance in standing. Cont void of bladder. Anterior peri care with close S, clothing management with min A in standing for balance.  Pt left in w/c with safety belt alarm engaged, call bell in reach, and all immediate needs met.   Discharge Criteria: Patient  will be discharged from OT if patient refuses treatment 3 consecutive times without medical reason, if treatment goals not met, if there is a change in medical status, if patient makes no progress towards goals or if patient is discharged from hospital.  The above assessment, treatment plan, treatment alternatives and goals were discussed and mutually agreed upon: by patient  Volanda Napoleon MS, OTR/L  02/18/2020, 12:59 PM

## 2020-02-18 NOTE — Progress Notes (Signed)
Inpatient Rehabilitation  Patient information reviewed and entered into eRehab system by Rohail Klees M. Videl Nobrega, M.A., CCC/SLP, PPS Coordinator.  Information including medical coding, functional ability and quality indicators will be reviewed and updated through discharge.    

## 2020-02-18 NOTE — Plan of Care (Signed)
Problem: RH Balance Goal: LTG: Patient will maintain dynamic sitting balance (OT) Description: LTG:  Patient will maintain dynamic sitting balance with assistance during activities of daily living (OT) Flowsheets (Taken 02/18/2020 1620) LTG: Pt will maintain dynamic sitting balance during ADLs with: Independent with assistive device Goal: LTG Patient will maintain dynamic standing with ADLs (OT) Description: LTG:  Patient will maintain dynamic standing balance with assist during activities of daily living (OT)  Flowsheets (Taken 02/18/2020 1620) LTG: Pt will maintain dynamic standing balance during ADLs with: Supervision/Verbal cueing   Problem: Sit to Stand Goal: LTG:  Patient will perform sit to stand in prep for activites of daily living with assistance level (OT) Description: LTG:  Patient will perform sit to stand in prep for activites of daily living with assistance level (OT) Flowsheets (Taken 02/18/2020 1620) LTG: PT will perform sit to stand in prep for activites of daily living with assistance level: Independent with assistive device   Problem: RH Eating Goal: LTG Patient will perform eating w/assist, cues/equip (OT) Description: LTG: Patient will perform eating with assist, with/without cues using equipment (OT) Flowsheets (Taken 02/18/2020 1620) LTG: Pt will perform eating with assistance level of: Independent with assistive device    Problem: RH Grooming Goal: LTG Patient will perform grooming w/assist,cues/equip (OT) Description: LTG: Patient will perform grooming with assist, with/without cues using equipment (OT) Flowsheets (Taken 02/18/2020 1620) LTG: Pt will perform grooming with assistance level of: Independent with assistive device    Problem: RH Bathing Goal: LTG Patient will bathe all body parts with assist levels (OT) Description: LTG: Patient will bathe all body parts with assist levels (OT) Flowsheets (Taken 02/18/2020 1620) LTG: Pt will perform bathing with assistance  level/cueing: Supervision/Verbal cueing   Problem: RH Dressing Goal: LTG Patient will perform upper body dressing (OT) Description: LTG Patient will perform upper body dressing with assist, with/without cues (OT). Flowsheets (Taken 02/18/2020 1620) LTG: Pt will perform upper body dressing with assistance level of: Independent with assistive device Goal: LTG Patient will perform lower body dressing w/assist (OT) Description: LTG: Patient will perform lower body dressing with assist, with/without cues in positioning using equipment (OT) Flowsheets (Taken 02/18/2020 1620) LTG: Pt will perform lower body dressing with assistance level of: Independent with assistive device   Problem: RH Toileting Goal: LTG Patient will perform toileting task (3/3 steps) with assistance level (OT) Description: LTG: Patient will perform toileting task (3/3 steps) with assistance level (OT)  Flowsheets (Taken 02/18/2020 1620) LTG: Pt will perform toileting task (3/3 steps) with assistance level: Independent with assistive device   Problem: RH Functional Use of Upper Extremity Goal: LTG Patient will use RT/LT upper extremity as a (OT) Description: LTG: Patient will use right/left upper extremity as a stabilizer/gross assist/diminished/nondominant/dominant level with assist, with/without cues during functional activity (OT) Flowsheets (Taken 02/18/2020 1620) LTG: Use of upper extremity in functional activities: LUE as nondominant level LTG: Pt will use upper extremity in functional activity with assistance level of: Supervision/Verbal cueing   Problem: RH Toilet Transfers Goal: LTG Patient will perform toilet transfers w/assist (OT) Description: LTG: Patient will perform toilet transfers with assist, with/without cues using equipment (OT) Flowsheets (Taken 02/18/2020 1620) LTG: Pt will perform toilet transfers with assistance level of: Supervision/Verbal cueing   Problem: RH Tub/Shower Transfers Goal: LTG Patient will  perform tub/shower transfers w/assist (OT) Description: LTG: Patient will perform tub/shower transfers with assist, with/without cues using equipment (OT) Flowsheets (Taken 02/18/2020 1620) LTG: Pt will perform tub/shower stall transfers with assistance level of:  Supervision/Verbal cueing

## 2020-02-18 NOTE — Evaluation (Signed)
Physical Therapy Assessment and Plan  Patient Details  Name: [name] MRN: [id number] Date of Birth: 07/21/1940  PT Diagnosis: Abnormal posture, Abnormality of gait, Difficulty walking and Hemiparesis non-dominant Rehab Potential: Good ELOS: 8-10 days   Today's Date: 02/18/2020 PT Individual Time: 6433-2951 PT Individual Time Calculation (min): 72 min    Hospital Problem: Active Problems:   Ischemic stroke of frontal lobe [facility])   Past Medical History:  Past Medical History:  Diagnosis Date  . Arthritis   . Cerebrovascular disease   . CKD (chronic kidney disease)    Sees Dr Hollie Salk  . Coronary artery disease   . Depression   . Diabetes ([place])    INSULIN DEPENDENT  . Diabetic peripheral neuropathy ([place])   . Diabetic retinopathy ([place])   . Diverticulitis   . GERD (gastroesophageal reflux disease)   . History of CVA (cerebrovascular accident)    x 2 no residulal  . Hyperlipidemia   . Hypertension   . Obesity   . Renal lesion   . S/P TAVR (transcatheter aortic valve replacement) 11/13/2018   s/p TAVR with a 8m Edwards S3U via the TF approach by Dr. ORoxy Mannsand Dr. MAngelena Form . Severe aortic stenosis    Past Surgical History:  Past Surgical History:  Procedure Laterality Date  . ABDOMINAL HYSTERECTOMY    . AV FISTULA PLACEMENT Left 12/18/2017   Procedure: ARTERIOVENOUS (AV) FISTULA CREATION ARM;  Surgeon: CMarty Heck MD;  [place]  Service: Vascular;  Laterality: Left;  . bilateral cataract surgery    . CARDIAC CATHETERIZATION  01/07/2014   [place]  02/1999  . COLONOSCOPY    . COLOSTOMY  02/1999  . COLOSTOMY CLOSURE  07/1999  . EYE SURGERY Left 2019  . FISTULA SUPERFICIALIZATION Left 06/29/2018   Procedure: FISTULA SUPERFICIALIZATION LEFT BRACHIOCEPHALIC;  Surgeon: CMarty Heck MD;  [place]  Service: Vascular;  Laterality: Left;  . LEFT HEART CATHETERIZATION WITH CORONARY ANGIOGRAM N/A 01/07/2014   Procedure: LEFT  HEART CATHETERIZATION WITH CORONARY ANGIOGRAM;  Surgeon: JLaverda Page MD;  [facility];  Service: Cardiovascular;  Laterality: N/A;  . LOOP RECORDER INSERTION N/A 02/17/2020   Procedure: LOOP RECORDER INSERTION;  Surgeon: KDeboraha Sprang MD;  [place];  Service: Cardiovascular;  Laterality: N/A;  . middle cerebral artery stent placement Right   . OTHER SURGICAL HISTORY     laser surgery  . PTCA  01/07/2014   DES to RCA    DR GEinar Gip . right knee surgery Right    for infection  . RIGHT/LEFT HEART CATH AND CORONARY ANGIOGRAPHY N/A 10/23/2018   Procedure: RIGHT/LEFT HEART CATH AND CORONARY ANGIOGRAPHY;  Surgeon: PNigel Mormon MD;  [facility];  Service: Cardiovascular;  Laterality: N/A;  . TEE WITHOUT CARDIOVERSION N/A 11/13/2018   Procedure: TRANSESOPHAGEAL ECHOCARDIOGRAM (TEE);  Surgeon: MBurnell Blanks MD;  [place]  Service: Open Heart Surgery;  Laterality: N/A;  . TRANSCATHETER AORTIC VALVE REPLACEMENT, TRANSFEMORAL  11/13/2018  . TRANSCATHETER AORTIC VALVE REPLACEMENT, TRANSFEMORAL N/A 11/13/2018   Procedure: TRANSCATHETER AORTIC VALVE REPLACEMENT, TRANSFEMORAL;  Surgeon: MBurnell Blanks MD;  [place]  Service: Open Heart Surgery;  Laterality: N/A;    Assessment & Plan Clinical Impression: Patient is a 80y.o. female with history of T2DM with peripheral neuropathy and retinopathy, RA, CKD--stage III/IV (Dr. UHollie Salk, CVA, CAD/AS s/p TVAR who was admitted on 02/08/20 with reports of  headache, left> right sided weakness and difficulty walking. MRI brain negative. On 01/25, she developed sudden onset of LLE numbness with left sided weakness. MRI/MRA brain repeated 01/25 and revealed interval development of 17m acute infarct in right frontal parietal motor cortex, chronic right frontal and right occipital infarcts, bilateral PCA stenosis and loss of flow in R-M1 stent --unchanged since 2020.  She received  tPA and follow up MRI showed two foci of acute parenchymal hemorrhage in mid to posterior right frontal lobe with mild surrounding edema and 2 unchanged acute/subacute right frontal lobe infarcts.    Prior 2D echo 12/2019 EF 56% with moderate concentric hypertrophy and well seated aortic valve. TEE not needed. All antiplatelets held due to hemorrhagic conversion and with question plans to start ASA today. Question transient episode of A fib on 01/30--Loop recorder placed 02/17/2020 for work up of cryptogenic stroke--need to keep incision C/D for 3 days. Therapy ongoing and patient continues to be limited by Left sided weakness affecting ability to carry out ADLs and mobility. Patient transferred to CIR on 02/17/2020 .   Patient currently requires min A with mobility secondary to muscle weakness, decreased cardiorespiratoy endurance, unbalanced muscle activation, decreased coordination and decreased motor planning and decreased standing balance and decreased balance strategies.  Prior to hospitalization, patient was independent  with mobility and lived Alone in a House.  Home access is 2Stairs to enter.  Patient will benefit from skilled PT intervention to maximize safe functional mobility, minimize fall risk and decrease caregiver burden for planned discharge home with 24 hour supervision.  Anticipate patient will benefit from follow up OP or HFort Lewisat discharge - TBD based on pt's progress.  PT - End of Session Activity Tolerance: Tolerates 30+ min activity with multiple rests Endurance Deficit: Yes Endurance Deficit Description: Req increased time/freq rest breaks throughout session PT Assessment Rehab Potential (ACUTE/IP ONLY): Good PT Barriers to Discharge: Inaccessible home environment;Home environment access/layout PT Patient demonstrates impairments in the following area(s): Balance;Endurance;Motor;Safety PT Transfers Functional Problem(s): Bed Mobility;Bed to Chair;Car;Furniture PT Locomotion  Functional Problem(s): Ambulation;Stairs PT Plan PT Intensity: Minimum of 1-2 x/day ,45 to 90 minutes PT Frequency: 5 out of 7 days PT Duration Estimated Length of Stay: 8-10 days PT Treatment/Interventions: Ambulation/gait training;Balance/vestibular training;Community reintegration;Discharge planning;Disease management/prevention;DME/adaptive equipment instruction;Functional mobility training;Neuromuscular re-education;Patient/family education;Psychosocial support;Stair training;Therapeutic Activities;Therapeutic Exercise;UE/LE Strength taining/ROM;UE/LE Coordination activities;Visual/perceptual remediation/compensation;Wheelchair propulsion/positioning PT Transfers Anticipated Outcome(s): Bed mobility: Mod I and Transfers: supervision for all mobility PT Locomotion Anticipated Outcome(s): supervision using LRAD for at least 200 feet PT Recommendation Recommendations for Other Services: Therapeutic Recreation consult Therapeutic Recreation Interventions: Pet therapy;Kitchen group;Stress management;Outing/community reintergration Follow Up Recommendations: Home health PT;Outpatient PT;24 hour supervision/assistance Patient destination: Home Equipment Recommended: To be determined   PT Evaluation Precautions/Restrictions Precautions Precautions: Fall;Other (comment) Precaution Comments: LLE weakness, L eye retinopathy, AV fistula L arm, new LOOP recorder placement Restrictions Weight Bearing Restrictions: No   Home Living/Prior Functioning Home Living Living Arrangements: Other relatives (Lives with brother) Available Help at Discharge: Family;Available 24 hours/day Type of Home: House Home Access: Stairs to enter ECenterPoint Energyof Steps: 2 Entrance Stairs-Rails: None Home Layout: Two level Alternate Level Stairs-Number of Steps: flight Alternate Level Stairs-Rails: Left;Right Bathroom Shower/Tub: WMultimedia programmer Standard Additional Comments: Pt may be  going to brother's home on D/C. One level home, 1 STE no rail, and tub shower for pt's bathroom.  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 [employer] Vision/Perception  Perception Perception: Within Functional Limits Praxis Praxis: Intact  Cognition Overall Cognitive Status: Within Functional Limits for tasks assessed Arousal/Alertness: Awake/alert Attention: Focused;Sustained Focused Attention: Appears intact Sustained Attention: Appears intact Memory: Appears intact Awareness: Appears intact Problem Solving: Impaired Problem Solving Impairment: Functional complex Safety/Judgment: Appears intact Sensation Sensation Light Touch: Appears Intact  Coordination Gross Motor Movements are Fluid and Coordinated: No Fine Motor Movements are Fluid and Coordinated: No Coordination and Movement Description: LLE slow to initiate in functional transfers, requires extra time for motor planning; decreased Motor  Motor Motor: Hemiplegia Motor - Skilled Clinical Observations: L hemiparesis and slow to motor plan   Trunk/Postural Assessment  Cervical Assessment Cervical Assessment: Exceptions to [facility] (forward head) Thoracic Assessment Thoracic Assessment: Exceptions to [facility] (rounded shoulders, L lower than R) Lumbar Assessment Lumbar Assessment: Exceptions to [facility] (posterior pelvic rotation) Postural Control Postural Control: Within Functional Limits  Balance Balance Balance Assessed: Yes Standardized Balance Assessment Standardized Balance Assessment: Timed Up and Go Test Timed Up and Go  Test TUG: Normal TUG Normal TUG (seconds): 1.52 (avg of 61mn 36sec, 186m 46sec, 63m70m15sec.) Static Sitting Balance Static Sitting - Balance Support: Feet supported Static Sitting - Level of Assistance: 5: Stand by assistance Dynamic Sitting Balance Dynamic Sitting - Balance Support: Feet supported;During functional activity;Bilateral upper extremity supported Dynamic Sitting - Level of Assistance: 5: Stand by assistance Dynamic Sitting - Balance Activities: Lateral lean/weight shifting;Forward lean/weight shifting;Reaching for objects;Reaching across midline Sitting balance - Comments: min lateral lean Left, CGA for safety. Static Standing Balance Static Standing - Balance Support: During functional activity;Bilateral upper extremity supported;No upper extremity supported Static Standing - Level of Assistance: 4: Min assist Dynamic Standing Balance Dynamic Standing - Balance Support: Bilateral upper extremity supported;During functional activity Dynamic Standing - Level of Assistance: 4: Min assist Dynamic Standing - Balance Activities: Lateral lean/weight shifting;Forward lean/weight shifting;Reaching for objects;Reaching across midline Extremity Assessment      RLE Assessment RLE Assessment: Exceptions to [facility] Strength RLE Overall Strength: Within Functional Limits for tasks assessed Right Hip Flexion: 4/5 Right Hip ABduction: 4/5 Right Hip ADduction: 4-/5 Right Knee Flexion: 4-/5 Right Knee Extension: 4+/5 Right Ankle Dorsiflexion: 4/5 Right Ankle Plantar Flexion: 4+/5 LLE Assessment LLE Assessment: Exceptions to WFL LLE Strength LLE Overall Strength: Deficits Left Hip Flexion: 3+/5 Left Hip ABduction: 3+/5 Left Hip ADduction: 3/5 Left Knee Flexion: 3/5 Left Knee Extension: 4-/5 Left Ankle Dorsiflexion: 3+/5 Left Ankle Plantar Flexion: 3+/5  Care Tool Care Tool Bed Mobility Roll left and right activity   Roll left and right assist level: Contact Guard/Touching  assist    Sit to lying activity   Sit to lying assist level: Minimal Assistance - Patient > 75%    Lying to sitting edge of bed activity   Lying to sitting edge of bed assist level: Contact Guard/Touching assist     Care Tool Transfers Sit to stand transfer   Sit to stand assist level: Minimal Assistance - Patient > 75%    Chair/bed transfer   Chair/bed transfer assist level: Minimal Assistance - Patient > 75%     Toilet transfer   Assist Level: Contact Guard/Touching assist    Car transfer   Car transfer assist level: Minimal Assistance - Patient > 75%  Care Tool Locomotion Ambulation   Assist level: Minimal Assistance - Patient > 75% Assistive device: Walker-rolling Max distance: 100 ft  Walk 10 feet activity   Assist level: Minimal Assistance - Patient > 75% Assistive device: Walker-rolling   Walk 50 feet with 2 turns activity   Assist level: Minimal Assistance - Patient > 75% Assistive device: Walker-rolling  Walk 150 feet activity Walk 150 feet activity did not occur: Safety/medical concerns      Walk 10 feet on uneven surfaces activity Walk 10 feet on uneven surfaces activity did not occur: Safety/medical concerns      Stairs   Assist level: Minimal Assistance - Patient > 75% Stairs assistive device: 2 hand rails Max number of stairs: 4  Walk up/down 1 step activity   Walk up/down 1 step (curb) assist level: Minimal Assistance - Patient > 75% Walk up/down 1 step or curb assistive device: 2 hand rails    Walk up/down 4 steps activity Walk up/down 4 steps assist level: Minimal Assistance - Patient > 75% Walk up/down 4 steps assistive device: 2 hand rails  Walk up/down 12 steps activity Walk up/down 12 steps activity did not occur: Safety/medical concerns      Pick up small objects from floor Pick up small object from the floor (from standing position) activity did not occur: Safety/medical concerns      Wheelchair Will patient use wheelchair at  discharge?: No          Wheel 50 feet with 2 turns activity      Wheel 150 feet activity        Refer to Care Plan for Long Term Goals  SHORT TERM GOAL WEEK 1 PT Short Term Goal 1 (Week 1): STG = LTG d/t ELOS  Recommendations for other services: Therapeutic Recreation  Pet therapy, Kitchen group, Stress management and Outing/community reintegration  Skilled Therapeutic Intervention Mobility Bed Mobility Bed Mobility: Supine to Sit;Rolling Right Rolling Right: Supervision/verbal cueing Supine to Sit: Supervision/Verbal cueing Transfers Transfers: Sit to Stand;Stand to Sit;Stand Pivot Transfers Sit to Stand: Minimal Assistance - Patient > 75% Stand to Sit: Minimal Assistance - Patient > 75% Stand Pivot Transfers: Minimal Assistance - Patient > 75% Stand Pivot Transfer Details: Tactile cues for placement;Tactile cues for posture;Verbal cues for technique;Verbal cues for precautions/safety Stand Pivot Transfer Details (indicate cue type and reason): VC for LLE advacement Locomotion  Gait Ambulation: Yes Gait Assistance: Minimal Assistance - Patient > 75% Gait Distance (Feet): 100 Feet Assistive device: Rolling walker Gait Assistance Details: Tactile cues for posture;Verbal cues for technique;Verbal cues for precautions/safety;Verbal cues for gait pattern;Verbal cues for safe use of DME/AE Gait Gait: Yes Gait Pattern: Impaired Gait Pattern: Step-to pattern;Step-through pattern;Decreased step length - left;Decreased step length - right;Decreased hip/knee flexion - left;Decreased dorsiflexion - left;Poor foot clearance - left Gait velocity: reduced Stairs / Additional Locomotion Stairs: Yes Stairs Assistance: Minimal Assistance - Patient > 75% Stair Management Technique: Two rails;Step to pattern Number of Stairs: 4 Height of Stairs: 6 Wheelchair Mobility Wheelchair Mobility: No  Session 1 Note: Patient seated upright in w/c upon PT arrival. Patient alert and agreeable  to PT session. Patient denied pain throughout session.  Therapeutic Activity: Bed Mobility: Patient performed supine to/from sit requiring use of bed rails and Min A for UB to reach upright seated position on EOB. Provided verbal cues for technique. Transfers: Patient performed STS and SPVT transfers throughout session requiring Min A for power up. Following instruction for use of BUE at w/c armrests,  she is able to push-to-stand with near CGA. Provided verbal cues for technique in safe hand progression in rise to stand and descent to sit.  Gait Training:  Patient ambulated 100' x1/ 25' x2 using RW with Min A for balance and initiating weight shifting. She demos decreased L knee and hip flexion during swing phase and either drags foot or toe along floor. Provided verbal cues for increasing hip/ knee flexion with improved quality of stepping, however decreased gait speed as she requires time to consciously motor plan.   Pt performs step negotiation using BHR and Min A for balance and initial power up and controlled descent. Instructed in ascending with use of unaffected LE first and desending with affected LE first. She is able to perform correctly throughout with vc for foot placement on each step and for BUE progression.   TUG performed with pt demonstrating increased fatigue with each bout and averaging 43mn/ 52 sec to complete. Used RW to complete.  Neuromuscular Re-ed: NMR facilitated during session with focus on standing balance and improved motor control. Pt guided in static standing balance over wide and narrow BOS. She also improved hip/ knee flexion performance with bilateral toe touches to 6" step. NMR performed for improvements in motor control and coordination, balance, sequencing, judgement, and self confidence/ efficacy in performing all aspects of mobility at highest level of independence.   Patient seated in w/c at end of session with brakes locked, belt alarm set, and all needs  within reach. Dtr present in room.   Discharge Criteria: Patient will be discharged from PT if patient refuses treatment 3 consecutive times without medical reason, if treatment goals not met, if there is a change in medical status, if patient makes no progress towards goals or if patient is discharged from hospital.  The above assessment, treatment plan, treatment alternatives and goals were discussed and mutually agreed upon: by patient and by family.  JAlger SimonsPT, DPT 02/18/2020, 11:47 AM

## 2020-02-18 NOTE — Progress Notes (Signed)
Inpatient Rehabilitation Care Coordinator Assessment and Plan Patient Details  Name: [name] MRN: [id number] Date of Birth: [birth year]  Today's Date: 02/18/2020  Hospital Problems: Active Problems:   Ischemic stroke of frontal lobe [facility])  Past Medical History:  Past Medical History:  Diagnosis Date  . Arthritis   . Cerebrovascular disease   . CKD (chronic kidney disease)    Sees Dr Hollie Salk  . Coronary artery disease   . Depression   . Diabetes ([place])    INSULIN DEPENDENT  . Diabetic peripheral neuropathy ([employer])   . Diabetic retinopathy ([place])   . Diverticulitis   . GERD (gastroesophageal reflux disease)   . History of CVA (cerebrovascular accident)    x 2 no residulal  . Hyperlipidemia   . Hypertension   . Obesity   . Renal lesion   . S/P TAVR (transcatheter aortic valve replacement) 11/13/2018   s/p TAVR with a 72m Edwards S3U via the TF approach by Dr. ORoxy Mannsand Dr. MAngelena Form . Severe aortic stenosis    Past Surgical History:  Past Surgical History:  Procedure Laterality Date  . ABDOMINAL HYSTERECTOMY    . AV FISTULA PLACEMENT Left 12/18/2017   Procedure: ARTERIOVENOUS (AV) FISTULA CREATION ARM;  Surgeon: CMarty Heck MD;  [place]  Service: Vascular;  Laterality: Left;  . bilateral cataract surgery    . CARDIAC CATHETERIZATION  01/07/2014   DR GWaldoRESECTION  02/1999  . COLONOSCOPY    . COLOSTOMY  02/1999  . COLOSTOMY CLOSURE  07/1999  . EYE SURGERY Left 2019  . FISTULA SUPERFICIALIZATION Left 06/29/2018   Procedure: FISTULA SUPERFICIALIZATION LEFT BRACHIOCEPHALIC;  Surgeon: CMarty Heck MD;  [place]  Service: Vascular;  Laterality: Left;  . LEFT HEART CATHETERIZATION WITH CORONARY ANGIOGRAM N/A 01/07/2014   Procedure: LEFT HEART CATHETERIZATION WITH CORONARY ANGIOGRAM;  Surgeon: JLaverda Page MD;  [facility]  Service: Cardiovascular;  Laterality: N/A;  . LOOP RECORDER INSERTION N/A 02/17/2020    Procedure: LOOP RECORDER INSERTION;  Surgeon: KDeboraha Sprang MD;  [place] [facility];  Service: Cardiovascular;  Laterality: N/A;  . middle cerebral artery stent placement Right   . OTHER SURGICAL HISTORY     laser surgery  . PTCA  01/07/2014   DES to RCA    DR GEinar Gip . right knee surgery Right    for infection  . RIGHT/LEFT HEART CATH AND CORONARY ANGIOGRAPHY N/A 10/23/2018   Procedure: RIGHT/LEFT HEART CATH AND CORONARY ANGIOGRAPHY;  Surgeon: PNigel Mormon MD;  [place] [facility];  Service: Cardiovascular;  Laterality: N/A;  . TEE WITHOUT CARDIOVERSION N/A 11/13/2018   Procedure: TRANSESOPHAGEAL ECHOCARDIOGRAM (TEE);  Surgeon: MBurnell Blanks MD;  [place]  Service: Open Heart Surgery;  Laterality: N/A;  . TRANSCATHETER AORTIC VALVE REPLACEMENT, TRANSFEMORAL  11/13/2018  . TRANSCATHETER AORTIC VALVE REPLACEMENT, TRANSFEMORAL N/A 11/13/2018   Procedure: TRANSCATHETER AORTIC VALVE REPLACEMENT, TRANSFEMORAL;  Surgeon: MBurnell Blanks MD;  [place]  Service: Open Heart Surgery;  Laterality: N/A;   Social History:  reports that she has never smoked. She has never used smokeless tobacco. She reports that she does not drink alcohol and does not use drugs.  Family / Support Systems Children: 1 adult sons (1 in college) Other Supports: JSonia SideKimbrough Anticipated Caregiver: JSonia Side(Brother) Ability/Limitations of Caregiver: none Caregiver Availability: 24/7  Social History Preferred language: English Religion: Baptist Read: Yes Write: Yes Employment Status: Unemployed LScientist, research (physical sciences)  History/Current Legal Issues: n/a Guardian/Conservator: n/a   Abuse/Neglect Abuse/Neglect Assessment Can Be Completed: Yes Physical Abuse: Denies Verbal Abuse: Denies Sexual Abuse: Denies Exploitation of patient/patient's resources: Denies Self-Neglect: Denies  Emotional Status Recent Psychosocial Issues: no Psychiatric History: no Substance Abuse  History: no  Patient / Family Perceptions, Expectations & Goals Pt/Family understanding of illness & functional limitations: yes Premorbid pt/family roles/activities: Previosuly Independent Pt/family expectations/goals: Supervision to [employer]: None Premorbid Home Care/DME Agencies: Other (Comment) Radio broadcast assistant) Transportation available at discharge: Family able to transport Resource referrals recommended: Neuropsychology (Hx or Depression)  Discharge Planning Living Arrangements: Other relatives (Lives with brother) Support Systems: Children,Other relatives Type of Residence: Private residence (2 level home, 2 steps to enter (Railings both sides)) Insurance Resources: Multimedia programmer (specify) ([employer] Medicare) Financial Resources: Family Support Financial Screen Referred: No Living Expenses: Lives with family Money Management: Patient Does the patient have any problems obtaining your medications?: No Home Management: Independent Patient/Family Preliminary Plans: May require some assistance Care Coordinator Barriers to Discharge: Insurance for SNF coverage Care Coordinator Anticipated Follow Up Needs: HH/OP Expected length of stay: 14-16 Days  Clinical Impression Sw met with patient introduced self explained role, addressed questions and concerns. Sw will continue to follow up.   Dyanne Iha 02/18/2020, 1:41 PM

## 2020-02-18 NOTE — Progress Notes (Addendum)
[place] PHYSICAL MEDICINE & REHABILITATION PROGRESS NOTE   Subjective/Complaints: No c/os this am , slept well, feels like LUE is stronger  OT eval, ADLs this am   ROS- neg CP, SOB, N/V/D Objective:   No results found. Recent Labs    02/17/20 0149 02/18/20 0456  WBC 11.4* 8.5  HGB 10.8* 10.4*  HCT 32.1* 30.8*  PLT 169 173   Recent Labs    02/17/20 0149 02/18/20 0456  NA 135 137  K 3.7 3.4*  CL 103 104  CO2 21* 21*  GLUCOSE 122* 97  BUN 41* 43*  CREATININE 2.60* 2.53*  CALCIUM 9.3 9.0   No intake or output data in the 24 hours ending 02/18/20 [id number]      Physical Exam: Vital Signs Blood pressure (!) 141/43, pulse 60, temperature 98.4 F (36.9 C), temperature source Oral, resp. rate 18, height '5\' 3"'$  (1.6 m), weight 71 kg, SpO2 100 %.  General: No acute distress Mood and affect are appropriate Heart: Regular rate and rhythm no rubs, gr 3/6 SEM  Lungs: Clear to auscultation, breathing unlabored, no rales or wheezes Abdomen: Positive bowel sounds, soft nontender to palpation, nondistended Extremities: No clubbing, cyanosis, or edema Skin: No evidence of breakdown, no evidence of rash Neurologic: Cranial nerves II through XII intact, motor strength is 5/5 in right  And 3/5 left  deltoid, bicep, tricep, grip, 5/5 Right and 2- Left hip flexor, knee extensors, 5/5 R and 0/5 L ankle dorsiflexor and plantar flexor Slower with [place] to thumb opposition on the left side  Musculoskeletal: Full range of motion in all 4 extremities. No joint swelling  Assessment/Plan: 1. Functional deficits which require 3+ hours per day of interdisciplinary therapy in a comprehensive inpatient rehab setting.  Physiatrist is providing close team supervision and 24 hour management of active medical problems listed below.  Physiatrist and rehab team continue to assess barriers to discharge/monitor patient progress toward functional and medical goals  Care Tool:  Bathing               Bathing assist       Upper Body Dressing/Undressing Upper body dressing   What is the patient wearing?: Hospital gown only    Upper body assist Assist Level: Minimal Assistance - Patient > 75%    Lower Body Dressing/Undressing Lower body dressing      What is the patient wearing?: Incontinence brief     Lower body assist Assist for lower body dressing: Moderate Assistance - Patient 50 - 74%     Toileting Toileting    Toileting assist Assist for toileting: Moderate Assistance - Patient 50 - 74%     Transfers Chair/bed transfer  Transfers assist           Locomotion Ambulation   Ambulation assist              Walk 10 feet activity   Assist           Walk 50 feet activity   Assist           Walk 150 feet activity   Assist           Walk 10 feet on uneven surface  activity   Assist           Wheelchair     Assist               Wheelchair 50 feet with 2 turns activity    Assist  Wheelchair 150 feet activity     Assist          Blood pressure (!) 141/43, pulse 60, temperature 98.4 F (36.9 C), temperature source Oral, resp. rate 18, height '5\' 3"'$  (1.6 m), weight 71 kg, SpO2 100 %.     Medical Problem List and Plan: 1.  Impaired mobility and ADLs secondary to right ACA infarcts             -patient may shower             -ELOS/Goals: 14-16 days modI 2.  Antithrombotics: -DVT/anticoagulation:  Mechanical: Sequential compression devices, below knee Bilateral lower extremities             -antiplatelet therapy: Transient thrombocytopenia has resolved. ASA to start? 3. Pain Management: tylenol prn for back/right hip pain.  4. Mood: LCSW to follow for evaluation and support.              -antipsychotic agents: N/A 5. Neuropsych: This patient is capable of making decisions on her own behalf. 6. Skin/Wound Care: Routine pressure relief measures  7. Fluids/Electrolytes/Nutrition:  Monitor I/O.  Hypokalemia due to torsemide will supplement low dose KCL , recheck later in the week  8. CAD/TVAR: Followed by Dr. Virgina Jock. Plavix was d/c due to bleed. Continue Imdur, Crestor and Demadex. 9. HTN: Monitor BP tid--continue Amlodipine, Demadex, Hydralazine and Imdur.  Vitals:   02/18/20 0524 02/18/20 0526  BP: (!) 143/42 (!) 141/43  Pulse: 61 60  Resp: 18   Temp: 98.4 F (36.9 C)   SpO2: 100%    9. T2DM with neuropathy/retionopathy: Hgb A1c- 7.7.CBGs ranging 70-160.               Continue Lantus 10 units with SSI for elevated BS. Will monitor BS ac/hs  CBG (last 3)  Recent Labs    02/17/20 1706 02/17/20 2035 02/18/20 0552  GLUCAP 70 299* 98  labile 2/1- monitor with increased activity level   10. CKD III/IV: BUN/SCr  30/2.44  at admission -->37/2.91-->41/2.60. Encourage fluid intake--check stable at 43/2.53        Filed Weights   02/17/20 1824 02/18/20 0500  Weight: 71 kg 71 kg  Will monitor weight daily for trend.  11. Leucocytosis: resolved WBC 8.5 today-- LOS: 1 days A FACE TO Bude E Taliyah Watrous 02/18/2020, 8:28 AM

## 2020-02-19 DIAGNOSIS — I63421 Cerebral infarction due to embolism of right anterior cerebral artery: Secondary | ICD-10-CM

## 2020-02-19 DIAGNOSIS — I639 Cerebral infarction, unspecified: Secondary | ICD-10-CM | POA: Diagnosis not present

## 2020-02-19 LAB — GLUCOSE, CAPILLARY
Glucose-Capillary: 112 mg/dL — ABNORMAL HIGH (ref 70–99)
Glucose-Capillary: 170 mg/dL — ABNORMAL HIGH (ref 70–99)
Glucose-Capillary: 125 mg/dL — ABNORMAL HIGH (ref 70–99)
Glucose-Capillary: 120 mg/dL — ABNORMAL HIGH (ref 70–99)

## 2020-02-19 NOTE — Progress Notes (Signed)
Occupational Therapy Session Note  Patient Details  Name: [name] MRN: [id number] Date of Birth: Apr 07, 1940  Today's Date: 02/19/2020 OT Individual Time: 7471-8550 OT Individual Time Calculation (min): 53 min    Short Term Goals: Week 1:  OT Short Term Goal 1 (Week 1): STG = LTG 2/2 ELOS  Skilled Therapeutic Interventions/Progress Updates:    Pt received in recliner, agreeable to therapy. Session focus on LUE NMR in prep for improved ADL/IADL performance. Amb to and from gym with RW + close S.   Assessments: Hand Dynameter: R: 38,41, 45, average of 41 lbs ; L: 25, 16, 20; average of 20 bs  9HPT: R: 38 seconds, L: 56 seconds  LUE NMR: Pt reports difficulty opening food/drink containers with L hand. Practiced opening pill bottles and palm<>digit translation to place beads into containers. Provided medium resistance/red theraputty for improved L FMC/grasp and printed HEP to complete in room; completed the following exercises: removing and inserting beads, putty squeezes, digit abduction/adduction, digit extension, and pinch and pull with LUE. Overall, req increased time to complete tasks and requires assist to complete full digit ROM against putty.   DME: Pt recalled that her brother has a walk-in shower with a lip that she will use, discussed anticipated need for shower chair and discussed price/buying options. Will cont to discuss with family to determine pt preference to purchase through hospital or at outside resource.  Pt left in recliner with chair alarm engaged, call bell in reach, and all immediate needs met.    Therapy Documentation Precautions:  Precautions Precautions: Fall,Other (comment) Precaution Comments: LLE weakness, L eye retinopathy, AV fistula L arm, new LOOP recorder placement Restrictions Weight Bearing Restrictions: No  Pain: Pain Assessment Pain Scale: 0-10 Pain Score: 0-No pain ADL: See Care Tool for more details.   Therapy/Group: Individual  Therapy  Volanda Napoleon MS, OTR/L  02/19/2020, 12:04 PM

## 2020-02-19 NOTE — Progress Notes (Signed)
PHYSICAL MEDICINE & REHABILITATION PROGRESS NOTE   Subjective/Complaints: Reviewed meds , no issues overnite Discussed labwork , K+ level D/c plan is home with brother who is at home   ROS- neg CP, SOB, N/V/D Objective:   No results found. Recent Labs    02/17/20 0149 02/18/20 0456  WBC 11.4* 8.5  HGB 10.8* 10.4*  HCT 32.1* 30.8*  PLT 169 173   Recent Labs    02/17/20 0149 02/18/20 0456  NA 135 137  K 3.7 3.4*  CL 103 104  CO2 21* 21*  GLUCOSE 122* 97  BUN 41* 43*  CREATININE 2.60* 2.53*  CALCIUM 9.3 9.0    Intake/Output Summary (Last 24 hours) at 02/19/2020 7867 Last data filed at 02/18/2020 1826 Gross per 24 hour  Intake 617 ml  Output --  Net 617 ml        Physical Exam: Vital Signs Blood pressure (!) 151/52, pulse (!) 59, temperature 98.2 F (36.8 C), temperature source Oral, resp. rate 16, height 5' 3"  (1.6 m), weight 70 kg, SpO2 100 %.   General: No acute distress Mood and affect are appropriate Heart: Regular rate and rhythm no rubs gr 3/6 SEM Lungs: Clear to auscultation, breathing unlabored, no rales or wheezes Abdomen: Positive bowel sounds, soft nontender to palpation, nondistended Extremities: No clubbing, cyanosis, or edema Skin: No evidence of breakdown, no evidence of rash   Neurologic: Cranial nerves II through XII intact, motor strength is 5/5 in right  And 3/5 left  deltoid, bicep, tricep, grip, 5/5 Right and 2- Left hip flexor, knee extensors, 5/5 R and 0/5 L ankle dorsiflexor and plantar flexor Slower with [place] to thumb opposition on the left side  Musculoskeletal: Full range of motion in all 4 extremities. No joint swelling  Assessment/Plan: 1. Functional deficits which require 3+ hours per day of interdisciplinary therapy in a comprehensive inpatient rehab setting.  Physiatrist is providing close team supervision and 24 hour management of active medical problems listed below.  Physiatrist and rehab team continue to  assess barriers to discharge/monitor patient progress toward functional and medical goals  Care Tool:  Bathing    Body parts bathed by patient: Right arm,Face,Left arm,Chest,Front perineal area,Abdomen,Buttocks,Right upper leg,Left upper leg,Right lower leg,Left lower leg         Bathing assist Assist Level: Minimal Assistance - Patient > 75%     Upper Body Dressing/Undressing Upper body dressing   What is the patient wearing?: Pull over shirt    Upper body assist Assist Level: Minimal Assistance - Patient > 75%    Lower Body Dressing/Undressing Lower body dressing      What is the patient wearing?: Underwear/pull up     Lower body assist Assist for lower body dressing: Minimal Assistance - Patient > 75%     Toileting Toileting    Toileting assist Assist for toileting: Minimal Assistance - Patient > 75%     Transfers Chair/bed transfer  Transfers assist     Chair/bed transfer assist level: Minimal Assistance - Patient > 75%     Locomotion Ambulation   Ambulation assist      Assist level: Minimal Assistance - Patient > 75% Assistive device: Walker-rolling Max distance: 100 ft   Walk 10 feet activity   Assist     Assist level: Minimal Assistance - Patient > 75% Assistive device: Walker-rolling   Walk 50 feet activity   Assist    Assist level: Minimal Assistance - Patient > 75% Assistive device: Walker-rolling  Walk 150 feet activity   Assist Walk 150 feet activity did not occur: Safety/medical concerns         Walk 10 feet on uneven surface  activity   Assist Walk 10 feet on uneven surfaces activity did not occur: Safety/medical concerns         Wheelchair     Assist Will patient use wheelchair at discharge?: No             Wheelchair 50 feet with 2 turns activity    Assist            Wheelchair 150 feet activity     Assist          Blood pressure (!) 151/52, pulse (!) 59, temperature 98.2  F (36.8 C), temperature source Oral, resp. rate 16, height 5' 3"  (1.6 m), weight 70 kg, SpO2 100 %.     Medical Problem List and Plan: 1.  Impaired mobility and ADLs secondary to right ACA infarcts             -patient may shower             -ELOS/Goals: 14-16 days modI Team conference today please see physician documentation under team conference tab, met with team  to discuss problems,progress, and goals. Formulized individual treatment plan based on medical history, underlying problem and comorbidities. 2.  Antithrombotics: -DVT/anticoagulation:  Mechanical: Sequential compression devices, below knee Bilateral lower extremities             -antiplatelet therapy: Transient thrombocytopenia has resolved. ASA to start? 3. Pain Management: tylenol prn for back/right hip pain.  4. Mood: LCSW to follow for evaluation and support.              -antipsychotic agents: N/A 5. Neuropsych: This patient is capable of making decisions on her own behalf. 6. Skin/Wound Care: Routine pressure relief measures  7. Fluids/Electrolytes/Nutrition: Monitor I/O.  Hypokalemia due to torsemide will supplement low dose KCL , recheck later in the week  8. CAD/TVAR: Followed by Dr. Virgina Jock. Plavix was d/c due to bleed. Continue Imdur, Crestor and Demadex. 9. HTN: Monitor BP tid--continue Amlodipine, Demadex, Hydralazine and Imdur.  Vitals:   02/18/20 1959 02/19/20 0615  BP: (!) 155/52 (!) 151/52  Pulse: 68 (!) 59  Resp: 20 16  Temp: 99.5 F (37.5 C) 98.2 F (36.8 C)  SpO2: 100% 100%   9. T2DM with neuropathy/retionopathy: Hgb A1c- 7.7.CBGs ranging 70-160.               Continue Lantus 10 units with SSI for elevated BS. Will monitor BS ac/hs  CBG (last 3)  Recent Labs    02/18/20 1619 02/18/20 2112 02/19/20 0612  GLUCAP 157* 131* 112*  labile 2/1- monitor with increased activity level   10. CKD III/IV: BUN/SCr  30/2.44  at admission -->37/2.91-->41/2.60. Encourage fluid intake--check stable at  43/2.53        Filed Weights   02/17/20 1824 02/18/20 0500 02/19/20 0615  Weight: 71 kg 71 kg 70 kg  Will monitor weight daily for trend.  11. Leucocytosis: resolved WBC 8.5 today-- LOS: 2 days A FACE TO FACE EVALUATION WAS PERFORMED  Charlett Blake 02/19/2020, 8:32 AM

## 2020-02-19 NOTE — Patient Care Conference (Signed)
Inpatient RehabilitationTeam Conference and Plan of Care Update Date: 02/19/2020   Time: 10:17 AM    Patient Name: [name]      Medical Record Number: [id number]  Date of Birth: 04/19/1940 Sex: Female         Room/Bed: 4M12C/4M12C-01 Payor Info: Payor: [employer] MEDICARE / Plan: [place] PPO / Product Type: *No Product type* /    Admit Date/Time:  02/17/2020  5:26 PM  Primary Diagnosis:  Cerebral infarction due to embolism of right anterior cerebral artery [facility])  Hospital Problems: Principal Problem:   Cerebral infarction due to embolism of right anterior cerebral artery [facility]) Active Problems:   Ischemic stroke of frontal lobe [facility])    Expected Discharge Date: Expected Discharge Date: 02/28/20  Team Members Present: Physician leading conference: Dr. Alysia Penna Care Coodinator Present: Dorien Chihuahua, RN, BSN, CRRN;Christina Sampson Goon, [place] Nurse Present: Judee Clara, LPN PT Present: Barrie Folk, PT OT Present: Other (comment) Providence Lanius, OT) [place] Coordinator present : Gunnar Fusi, SLP     Current Status/Progress Goal Weekly Team Focus  Bowel/Bladder   Continent of B&B.  Maintain continence.  Assess for toileting needs with every round.   Swallow/Nutrition/ Hydration             ADL's   close S for UB ADL, min A for LB ADL, min A for toilet/shower transfers; [place] LUE: IV, hand: V  supervision to mod I  LUE NMR, selfcare retraining, balance retraining, activity tolerance, pt/fam edu, DME/AE edu, functional transfer retraining   Mobility             Communication             Safety/Cognition/ Behavioral Observations            Pain   No c/o pain.  Pain equal to or less than 3.  Assess pain q shift.   Skin               Discharge Planning:  Goal to discharge back home with brother. 2 level home 2 steps to enter   Team Discussion: Right anterior cerebral infarct with left side weakness. BP slightly elevated but DM is  controlled. Significant CKD monitored by MD. Continent of bowel and bladder. Patient on target to meet rehab goals: yes, currently close supervision for upper body care and min assist for lower body care, CGA for transfers with toe drag right side easily corrected with cues. Supervision - Mod I goals set for discharge.  *See Care Plan and progress notes for long and short-term goals.   Revisions to Treatment Plan:   Teaching Needs: Transfers, toileting, secondary stroke risk management, medications, etc.    Current Barriers to Discharge: Decreased caregiver support and Home enviroment access/layout  Possible Resolutions to Barriers: Family education    Medical Summary Current Status: Mild to moderate blood pressure elevation, has required medications for constipation  Barriers to Discharge: Medical stability   Possible Resolutions to [employer] Focus: Will need to tighten blood pressure control prior to discharge, has multiple antihypertensive medications   Continued Need for Acute Rehabilitation Level of Care: The patient requires daily medical management by a physician with specialized training in physical medicine and rehabilitation for the following reasons: Direction of a multidisciplinary physical rehabilitation program to maximize functional independence : Yes Medical management of patient stability for increased activity during participation in an intensive rehabilitation regime.: Yes Analysis of laboratory values and/or radiology reports with any subsequent need for medication adjustment and/or  medical intervention. : Yes   I attest that I was present, lead the team conference, and concur with the assessment and plan of the team.   Dorien Chihuahua B 02/19/2020, 1:57 PM

## 2020-02-19 NOTE — Progress Notes (Signed)
Physical Therapy Session Note  Patient Details  Name: [name] MRN: [id number] Date of Birth: [birth year]  Today's Date: 02/19/2020 PT Individual Time: 1345-1443 PT Individual Time Calculation (min): 58 min   Short Term Goals: Week 1:  PT Short Term Goal 1 (Week 1): STG = LTG d/t ELOS  Skilled Therapeutic Interventions/Progress Updates:    Patient seated upright in recliner upon PT arrival. Patient alert and agreeable to PT session. Patient c/o continued low grade pain in L lower back during session. Addressed with stretching with pt stating improved paint symptoms afterward.   Therapeutic Activity: Transfers: Patient performed STS throughout session with improving hand progression prior to descent to sit. Minimal vc required. She is performing all standing transfers with SBA/ CGA.   Pt guided in seated low back stretch from w/c using theraball on mat table. Performs stretch with anterior leans and into diagonal leans/ reaches to each side with improved pain during and after stretches.   Gait Training:  Patient ambulated 150' x2 using RW with SBA as well as 150' x2 with no AD and CGA. She continues to require intermittent vc for increased hip/ knee flexion and heel strike. 3 instances of crossover stepping with vc for pt to maintain wider BOS. With cueing she is able to demo correction for brief periods.   Neuromuscular Re-ed: NMR facilitated during session with focus on dual task with static and dynamic standing balance. Pt guided in game of giant connect 4 requiring decision making, initiation, motor planning and performance. No LOB and pt is able to take small steps when necessary to reach for discs. No UE throughout and good ankle and hip strategies noted during reaches. Standing bout more than 99mn. NMR performed for improvements in motor control and coordination, balance, sequencing, judgement, and self confidence/ efficacy in performing all aspects of mobility at highest level of  independence.   Patient seated in recliner at end of session with brakes locked, chair alarm set, and all needs within reach.    Therapy Documentation Precautions:  Precautions Precautions: Fall,Other (comment) Precaution Comments: LLE weakness, L eye retinopathy, AV fistula L arm, new LOOP recorder placement Restrictions Weight Bearing Restrictions: No  Therapy/Group: Individual Therapy  JAlger Simons2/02/2020, 3:55 PM

## 2020-02-19 NOTE — Progress Notes (Addendum)
Physical Therapy Session Note  Patient Details  Name: [name] MRN: [id number] Date of Birth: 1940-07-05  Today's Date: 02/19/2020 PT Individual Time: 0800-0915 PT Individual Time Calculation (min): 75 min   Short Term Goals: Week 1:  PT Short Term Goal 1 (Week 1): STG = LTG d/t ELOS  Skilled Therapeutic Interventions/Progress Updates:  Patient supine in bed upon PT arrival. Patient alert and agreeable to PT session. Patient mild mild pain complaint during session with 3/ 10 pain at lower back L>R. Attempt for pt to increase mobility in order to decrease pain complaint. Improved to 2/10 with seated rest.   Therapeutic Activity: Bed Mobility: Patient performed supine--> sit with SBA and no vc required for technique. Used upper bed rail for partial assist to initiate . Transfers: Patient performed STS and SPVT throughout session with improved performance of push-to-stand from armrests of seat. Requires continued vc for reaching back to armrests to assist with controlled descent to seat. Toilet transfer performed to standard height toilet with CGA.   Pt guided in use of NuStep L2 x 82mn, L3 x 3651m and L2 x 3 min. She is able to maintain speed throughout entire 51m36mand maintains between 2.1 and 2.4 METs reaching 550 steps. NuStep utilized to promote improved CV endurance as well as to improve proprioception of increasing hip/ knee flexion for improved quality of gait.   Gait Training:  Patient ambulated 150' x1 with RW and close SBA with cues for increasing L knee/ hip flexion and increasing step length for improved foot clearance and overall quality of gait. She is able to correct with vc. She also ambulated 175' x1/ 100' x1 with no AD and CGA. VC for continued focus to improve LLE quality of gait. Increased sway noted with general fatigue.  Neuromuscular Re-ed: NMR facilitated during session with focus on functional reaching tasks during sitting and standing. Pt maintains standing balance  throughout with no LOB and SBA. With need for L knee extension during task, pt demonstrates poor initiation and requires several attempts prior to completing full knee extension. L knee extension performed during all seated rest breaks in order to improve motor planning and initiation. NMR performed for improvements in motor control and coordination, balance, sequencing, judgement, and self confidence/ efficacy in performing all aspects of mobility at highest level of independence.   Patient seated in recliner at end of session with brakes locked, seat alarm set, and all needs within reach.   Therapy Documentation Precautions:  Precautions Precautions: Fall,Other (comment) Precaution Comments: LLE weakness, L eye retinopathy, AV fistula L arm, new LOOP recorder placement Restrictions Weight Bearing Restrictions: No   Therapy/Group: Individual Therapy  JulAlger Simons2/2022, 10:33 AM

## 2020-02-19 NOTE — Progress Notes (Signed)
Patient ID: [name], female   DOB: Sep 02, 1940, 79 y.o.   MRN: [id number] Team Conference Report to Patient/Family  Team Conference discussion was reviewed with the patient and caregiver, including goals, any changes in plan of care and target discharge date.  Patient and caregiver express understanding and are in agreement.  The patient has a target discharge date of 02/28/20.  Dyanne Iha 02/19/2020, 2:01 PM

## 2020-02-19 NOTE — Progress Notes (Signed)
Patient ID: [name], female   DOB: [birth year], 80 y.o.   MRN: [id number] Met with the patient to review role of the nurse CM and address educational needs in collaboration with the SW to facilitate preparation for discharge. Reviewed secondary stroke risks including T2DM with A1c of 7.7, HTN, HLD with LDL of 199 and goal of 70 on crestor and APT-Plavix. Patient given information on Renal diet, cholesterol lowering diet and living with diabetes. Patient with loop recorder and noted records connected to phone and family able to help with insulin administration if she was not able to administer. No other concerns noted at present. Continue to follow along to discharge to address educational needs for discharge. Margarito Liner

## 2020-02-19 NOTE — IPOC Note (Signed)
Overall Plan of Care [facility]) Patient Details Name: [name] MRN: [id number] DOB: December 05, 1940  Admitting Diagnosis: <principal problem not specified>  Hospital Problems: Active Problems:   Ischemic stroke of frontal lobe ([place])     Functional Problem List: Nursing Motor,Safety  PT Balance,Endurance,Motor,Safety  OT Balance,Endurance,Motor,Vision  SLP    TR         Basic ADL's: OT Grooming,Bathing,Dressing,Toileting,Eating     Advanced  ADL's: OT       Transfers: PT Bed Mobility,Bed to Chair,Car,Furniture  OT Toilet,Tub/Shower     Locomotion: PT Ambulation,Stairs     Additional Impairments: OT Fuctional Use of Upper Extremity  SLP        TR      Anticipated Outcomes Item Anticipated Outcome  Self Feeding mod I  Swallowing      Basic self-care  mod I  Toileting  mod I   Bathroom Transfers supervision  Bowel/Bladder  Pt will maintain bowel/bladder continence during hospital stay.  Transfers  Bed mobility: Mod I and Transfers: supervision for all mobility  Locomotion  supervision using LRAD for at least 200 feet  Communication     Cognition     Pain  Pt will maintain pain level during hospital stay.  Safety/Judgment  Pt will maintain safety/judgment awarness during hospital stay.   Therapy Plan: PT Intensity: Minimum of 1-2 x/day ,45 to 90 minutes PT Frequency: 5 out of 7 days PT Duration Estimated Length of Stay: 8-10 days OT Intensity: Minimum of 1-2 x/day, 45 to 90 minutes OT Frequency: 5 out of 7 days OT Duration/Estimated Length of Stay: 9 to 12 days     Due to the current state of emergency, patients may not be receiving their 3-hours of Medicare-mandated therapy.   Team Interventions: Nursing Interventions Patient/Family Education,Medication Management,Discharge Planning,Psychosocial Support,Disease Management/Prevention  PT interventions Ambulation/gait training,Balance/vestibular training,Community reintegration,Discharge  planning,Disease management/prevention,DME/adaptive equipment instruction,Functional mobility training,Neuromuscular re-education,Patient/family education,Psychosocial support,Stair training,Therapeutic Activities,Therapeutic Exercise,UE/LE Strength taining/ROM,UE/LE Coordination activities,Visual/perceptual remediation/compensation,Wheelchair propulsion/positioning  OT Interventions Balance/vestibular training,Disease mangement/prevention,Neuromuscular re-education,Self Care/advanced ADL retraining,Therapeutic Exercise,UE/LE Strength taining/ROM,Wheelchair propulsion/positioning,Skin care/wound Charity fundraiser education,UE/LE Coordination activities,Visual/perceptual remediation/compensation,Therapeutic Activities,Psychosocial support,Functional mobility training,Discharge planning  SLP Interventions    TR Interventions    SW/CM Interventions Discharge Planning,Psychosocial Support,Patient/Family Education,Disease Management/Prevention   Barriers to Discharge MD  Medical stability  Nursing Medication compliance,Nutrition means    PT Inaccessible home environment,Home environment access/layout    OT      SLP      SW Insurance for SNF coverage     Team Discharge Planning: Destination: PT-Home ,OT- Home , SLP-  Projected Follow-up: PT-Home health PT,Outpatient PT,24 hour supervision/assistance, OT-  Home health OT, SLP-  Projected Equipment Needs: PT-To be determined, OT- Tub/shower bench,3 in 1 bedside comode, SLP-  Equipment Details: PT- , OT-  Patient/family involved in discharge planning: PT- Patient,Family member/caregiver,  OT-Patient, SLP-   MD ELOS: 14-16 d Medical Rehab Prognosis:  Good Assessment:  80 year old female with history of T2DM with peripheral neuropathy and retinopathy, RA, CKD--stage III/IV (Dr. Hollie Salk), CVA, CAD/AS s/p TVAR who was admitted on 02/08/20 with reports of headache, left>  right sided weakness and difficulty walking. MRI brain negative. On 01/25, she developed sudden onset of LLE numbness with left sided weakness. MRI/MRA brain repeated 01/25 and revealed interval development of 93m acute infarct in right frontal parietal motor cortex, chronic right frontal and right occipital infarcts, bilateral PCA stenosis and loss of flow in R-M1 stent --unchanged since 2020.  She received tPA and  follow up MRI showed two foci of acute parenchymal hemorrhage in mid to posterior right frontal lobe with mild surrounding edema and 2 unchanged acute/subacute right frontal lobe infarcts.    Prior 2D echo 12/2019 EF 56% with moderate concentric hypertrophy and well seated aortic valve. TEE not needed. All antiplatelets held due to hemorrhagic conversion and with question plans to start ASA today. Question transient episode of A fib on 01/30--Loop recorder placed today for work up of cryptogenic stroke--need to keep incision C/D for 3 days. Therapy ongoing and patient continues to be limited by Left sided weakness affecting ability to carry out ADLs and mobility. Admitted to CIR 02/17/20 with functional decline, cough, impaired vision, joint pain, sensory change, and focal weakness.    Now requiring 24/7 Rehab RN,MD, as well as CIR level PT, OT and SLP.  Treatment team will focus on ADLs and mobility with goals set at Mod I See Team Conference Notes for weekly updates to the plan of care

## 2020-02-20 LAB — GLUCOSE, CAPILLARY
Glucose-Capillary: 105 mg/dL — ABNORMAL HIGH (ref 70–99)
Glucose-Capillary: 128 mg/dL — ABNORMAL HIGH (ref 70–99)
Glucose-Capillary: 130 mg/dL — ABNORMAL HIGH (ref 70–99)
Glucose-Capillary: 120 mg/dL — ABNORMAL HIGH (ref 70–99)

## 2020-02-20 NOTE — Progress Notes (Incomplete)
Physical Therapy Session Note  Patient Details  Name: MAGARET LAHMAN MRN: [id number] Date of Birth: 06-27-1940  Today's Date: 02/20/2020 PT Individual Time: 1350-1430 PT Individual Time Calculation (min): 40 min   Short Term Goals: Week 1:  PT Short Term Goal 1 (Week 1): STG = LTG d/t ELOS  Skilled Therapeutic Interventions/Progress Updates:    PAIN denies pain  Pt initially oob in wc and agreeable to session.   Transported to gym for session. Sit to stand w/cga no AD Gait 121f x 1 w/cga, inconsistent step length LLE, slow cadence, decreased armswing bilat.  Functional gait 1562fcga to min assist weaving around cones placed at 30f39fntervals, decreased cadence vs straight path, narrow base, pt c/o mild low back pain.  Seated trunk flexion 3 x15 sec stretch Supine lower trunk rotation x 10 Single knees to chest/alternating x 10 Piriformis stretch x 30 each side Hamstring stretch x 30 each.  Supine to/from sit w/cues to attend to LLE w/transition.  Short distance gait x 31f57f stairs w/cga to min assist.  Step ups on first step of 5in stairs w/2 rails repeated x 10 each for improved clearance, functional strengthening.  Pt w//decreased coordination w/task.  Pt transported back to room.  Pt left oob in wc w/alarm belt set and needs in reach   Therapy Documentation Precautions:  Precautions Precautions: Fall,Other (comment) Precaution Comments: LLE weakness, L eye retinopathy, AV fistula L arm, new LOOP recorder placement Restrictions Weight Bearing Restrictions: No    Therapy/Group: Individual Therapy  BarbCallie Fielding  Ty[place]/2022, 2:21 PM

## 2020-02-20 NOTE — Plan of Care (Signed)
  Problem: Consults Goal: RH STROKE PATIENT EDUCATION Description: See Patient Education module for education specifics  Outcome: Progressing Goal: Nutrition Consult-if indicated Outcome: Progressing Goal: Diabetes Guidelines if Diabetic/Glucose > 140 Description: If diabetic or lab glucose is > 140 mg/dl - Initiate Diabetes/Hyperglycemia Guidelines & Document Interventions  Outcome: Progressing   Problem: RH SKIN INTEGRITY Goal: RH STG SKIN FREE OF INFECTION/BREAKDOWN Description: Min assist Outcome: Progressing Goal: RH STG MAINTAIN SKIN INTEGRITY WITH ASSISTANCE Description: STG Maintain Skin Integrity With min Assistance. Outcome: Progressing   Problem: RH SAFETY Goal: RH STG ADHERE TO SAFETY PRECAUTIONS W/ASSISTANCE/DEVICE Description: STG Adhere to Safety Precautions With mod I Assistance/Device. Outcome: Progressing   Problem: RH PAIN MANAGEMENT Goal: RH STG PAIN MANAGED AT OR BELOW PT'S PAIN GOAL Description: Pain scale <4/10 Outcome: Progressing   Problem: RH KNOWLEDGE DEFICIT Goal: RH STG INCREASE KNOWLEDGE OF DIABETES Description: Patient will be able to manage DM with medications and dietary modifications using handouts and educational materials independently Outcome: Progressing Goal: RH STG INCREASE KNOWLEDGE OF HYPERTENSION Description: Patient will be able to manage HTN with medications and dietary modifications using handouts and educational materials independently Outcome: Progressing Goal: RH STG INCREASE KNOWLEGDE OF HYPERLIPIDEMIA Description: Patient will be able to manage HLD with medications and dietary modifications using handouts and educational materials independently Outcome: Progressing Goal: RH STG INCREASE KNOWLEDGE OF STROKE PROPHYLAXIS Description: Patient will be able to manage secondary stroke risks with medications and dietary modifications using handouts and educational materials independently Outcome: Progressing   Problem:  Consults Goal: RH STROKE PATIENT EDUCATION Description: See Patient Education module for education specifics  Outcome: Progressing   Problem: RH SKIN INTEGRITY Goal: RH STG ABLE TO PERFORM INCISION/WOUND CARE W/ASSISTANCE Description: STG Able To Perform Incision/Wound Care With min Assistance. Outcome: Progressing   Problem: RH SAFETY Goal: RH STG ADHERE TO SAFETY PRECAUTIONS W/ASSISTANCE/DEVICE Description: STG Adhere to Safety Precautions With cues/reminders Assistance/Device. Outcome: Progressing

## 2020-02-20 NOTE — Progress Notes (Signed)
[place] PHYSICAL MEDICINE & REHABILITATION PROGRESS NOTE   Subjective/Complaints:  No issues overnite, slept well, appetite good, discussed D/C date   ROS- neg CP, SOB, N/V/D Objective:   No results found. Recent Labs    02/18/20 0456  WBC 8.5  HGB 10.4*  HCT 30.8*  PLT 173   Recent Labs    02/18/20 0456  NA 137  K 3.4*  CL 104  CO2 21*  GLUCOSE 97  BUN 43*  CREATININE 2.53*  CALCIUM 9.0    Intake/Output Summary (Last 24 hours) at 02/20/2020 0819 Last data filed at 02/19/2020 1847 Gross per 24 hour  Intake 720 ml  Output -  Net 720 ml        Physical Exam: Vital Signs Blood pressure (!) 158/57, pulse 63, temperature 98.1 F (36.7 C), resp. rate 18, height '5\' 3"'$  (1.6 m), weight 70.9 kg, SpO2 97 %.   General: No acute distress Mood and affect are appropriate Heart: Regular rate and rhythm no rubs gr 3/6 SEM Lungs: Clear to auscultation, breathing unlabored, no rales or wheezes Abdomen: Positive bowel sounds, soft nontender to palpation, nondistended Extremities: No clubbing, cyanosis, or edema Skin: No evidence of breakdown, no evidence of rash   Neurologic: Cranial nerves II through XII intact, motor strength is 5/5 in right  And 3/5 left  deltoid, bicep, tricep, grip, 5/5 Right and 2- Left hip flexor, knee extensors, 5/5 R and 0/5 L ankle dorsiflexor and plantar flexor Slower with Mannor to thumb opposition on the left side  Musculoskeletal: Full range of motion in all 4 extremities. No joint swelling  Assessment/Plan: 1. Functional deficits which require 3+ hours per day of interdisciplinary therapy in a comprehensive inpatient rehab setting.  Physiatrist is providing close team supervision and 24 hour management of active medical problems listed below.  Physiatrist and rehab team continue to assess barriers to discharge/monitor patient progress toward functional and medical goals  Care Tool:  Bathing    Body parts bathed by patient: Right  arm,Face,Left arm,Chest,Front perineal area,Abdomen,Buttocks,Right upper leg,Left upper leg,Right lower leg,Left lower leg         Bathing assist Assist Level: Minimal Assistance - Patient > 75%     Upper Body Dressing/Undressing Upper body dressing   What is the patient wearing?: Pull over shirt    Upper body assist Assist Level: Minimal Assistance - Patient > 75%    Lower Body Dressing/Undressing Lower body dressing      What is the patient wearing?: Underwear/pull up     Lower body assist Assist for lower body dressing: Contact Guard/Touching assist     Toileting Toileting    Toileting assist Assist for toileting: Minimal Assistance - Patient > 75%     Transfers Chair/bed transfer  Transfers assist     Chair/bed transfer assist level: Supervision/Verbal cueing     Locomotion Ambulation   Ambulation assist      Assist level: Contact Guard/Touching assist Assistive device: No Device Max distance: 150 ft   Walk 10 feet activity   Assist     Assist level: Contact Guard/Touching assist Assistive device: No Device   Walk 50 feet activity   Assist    Assist level: Contact Guard/Touching assist Assistive device: No Device    Walk 150 feet activity   Assist Walk 150 feet activity did not occur: Safety/medical concerns  Assist level: Contact Guard/Touching assist Assistive device: No Device    Walk 10 feet on uneven surface  activity   Assist  Walk 10 feet on uneven surfaces activity did not occur: Safety/medical concerns         Wheelchair     Assist Will patient use wheelchair at discharge?: No             Wheelchair 50 feet with 2 turns activity    Assist            Wheelchair 150 feet activity     Assist          Blood pressure (!) 158/57, pulse 63, temperature 98.1 F (36.7 C), resp. rate 18, height '5\' 3"'$  (1.6 m), weight 70.9 kg, SpO2 97 %.     Medical Problem List and Plan: 1.  Impaired  mobility and ADLs secondary to right ACA infarcts             -patient may shower             -ELOS/Goals: 2/11 2.  Antithrombotics: -DVT/anticoagulation:  Mechanical: Sequential compression devices, below knee Bilateral lower extremities             -antiplatelet therapy: Transient thrombocytopenia has resolved. ASA to start? 3. Pain Management: tylenol prn for back/right hip pain.  4. Mood: LCSW to follow for evaluation and support.              -antipsychotic agents: N/A 5. Neuropsych: This patient is capable of making decisions on her own behalf. 6. Skin/Wound Care: Routine pressure relief measures  7. Fluids/Electrolytes/Nutrition: Monitor I/O.  Hypokalemia due to torsemide will supplement low dose KCL , recheck later in the week  8. CAD/TVAR: Followed by Dr. Virgina Jock. Plavix was d/c due to bleed. Continue Imdur, Crestor and Demadex. 9. HTN: Monitor BP tid--continue Amlodipine, Demadex, Hydralazine and Imdur.  Vitals:   02/19/20 1941 02/20/20 0606  BP: (!) 143/50 (!) 158/57  Pulse: 64 63  Resp: 20 18  Temp: 97.8 F (36.6 C) 98.1 F (36.7 C)  SpO2: 100% 97%   9. T2DM with neuropathy/retionopathy: Hgb A1c- 7.7.CBGs ranging 70-160.               Continue Lantus 10 units with SSI for elevated BS. Will monitor BS ac/hs  CBG (last 3)  Recent Labs    02/19/20 1636 02/19/20 2049 02/20/20 0613  GLUCAP 125* 120* 105*  controlled   10. CKD III/IV: BUN/SCr  30/2.44  at admission -->37/2.91-->41/2.60. Encourage fluid intake--check stable at 43/2.53        Filed Weights   02/18/20 0500 02/19/20 0615 02/20/20 0606  Weight: 71 kg 70 kg 70.9 kg  Will monitor weight daily for trend.  11. Leucocytosis: resolved WBC 8.5 today-- LOS: 3 days A FACE TO FACE EVALUATION WAS PERFORMED  Charlett Blake 02/20/2020, 8:19 AM

## 2020-02-20 NOTE — Evaluation (Signed)
Recreational Therapy Assessment and Plan  Patient Details  Name: [name] MRN: [id number] Date of Birth: August 14, 1940 Today's Date: 02/20/2020  Rehab Potential:  Good ELOS:   d/c 2/11  Assessment  Hospital Problem: Active Problems:   Ischemic stroke of frontal lobe [facility])   Past Medical History:      Past Medical History:  Diagnosis Date  . Arthritis   . Cerebrovascular disease   . CKD (chronic kidney disease)    Sees Dr Hollie Salk  . Coronary artery disease   . Depression   . Diabetes ([place])    INSULIN DEPENDENT  . Diabetic peripheral neuropathy ([place])   . Diabetic retinopathy ([place])   . Diverticulitis   . GERD (gastroesophageal reflux disease)   . History of CVA (cerebrovascular accident)    x 2 no residulal  . Hyperlipidemia   . Hypertension   . Obesity   . Renal lesion   . S/P TAVR (transcatheter aortic valve replacement) 11/13/2018   s/p TAVR with a 53m Edwards S3U via the TF approach by Dr. ORoxy Mannsand Dr. MAngelena Form . Severe aortic stenosis    Past Surgical History:       Past Surgical History:  Procedure Laterality Date  . ABDOMINAL HYSTERECTOMY    . AV FISTULA PLACEMENT Left 12/18/2017   Procedure: ARTERIOVENOUS (AV) FISTULA CREATION ARM;  Surgeon: CMarty Heck MD;  [facility]  Service: Vascular;  Laterality: Left;  . bilateral cataract surgery    . CARDIAC CATHETERIZATION  01/07/2014   DR [place] Oak[place]  02/1999  . COLONOSCOPY    . COLOSTOMY  02/1999  . COLOSTOMY CLOSURE  07/1999  . EYE SURGERY Left 2019  . FISTULA SUPERFICIALIZATION Left 06/29/2018   Procedure: FISTULA SUPERFICIALIZATION LEFT BRACHIOCEPHALIC;  Surgeon: CMarty Heck MD;  [facility] [facility]  Service: Vascular;  Laterality: Left;  . LEFT HEART CATHETERIZATION WITH CORONARY ANGIOGRAM N/A 01/07/2014   Procedure: LEFT HEART CATHETERIZATION WITH CORONARY ANGIOGRAM;  Surgeon: JLaverda Page MD;  [facility];  Service:  Cardiovascular;  Laterality: N/A;  . LOOP RECORDER INSERTION N/A 02/17/2020   Procedure: LOOP RECORDER INSERTION;  Surgeon: KDeboraha Sprang MD;  [facility];  Service: Cardiovascular;  Laterality: N/A;  . middle cerebral artery stent placement Right   . OTHER SURGICAL HISTORY     laser surgery  . PTCA  01/07/2014   DES to RCA    DR GEinar Gip . right knee surgery Right    for infection  . RIGHT/LEFT HEART CATH AND CORONARY ANGIOGRAPHY N/A 10/23/2018   Procedure: RIGHT/LEFT HEART CATH AND CORONARY ANGIOGRAPHY;  Surgeon: PNigel Mormon MD;  [facility];  Service: Cardiovascular;  Laterality: N/A;  . TEE WITHOUT CARDIOVERSION N/A 11/13/2018   Procedure: TRANSESOPHAGEAL ECHOCARDIOGRAM (TEE);  Surgeon: MBurnell Blanks MD;  [facility]  Service: Open Heart Surgery;  Laterality: N/A;  . TRANSCATHETER AORTIC VALVE REPLACEMENT, TRANSFEMORAL  11/13/2018  . TRANSCATHETER AORTIC VALVE REPLACEMENT, TRANSFEMORAL N/A 11/13/2018   Procedure: TRANSCATHETER AORTIC VALVE REPLACEMENT, TRANSFEMORAL;  Surgeon: MBurnell Blanks MD;  [place] [facility]  Service: Open Heart Surgery;  Laterality: N/A;    Assessment & Plan Clinical Impression: Patient is a 80y.o. female with history of T2DM with peripheral neuropathy and retinopathy, RA, CKD--stage III/IV (Dr. UHollie Salk, CVA, CAD/AS s/p TVAR who was admitted on 02/08/20 with reports of headache, left>right sided weakness and difficulty walking. MRI brain negative. On 01/25, she  developed sudden onset of LLE numbness with left sided weakness. MRI/MRA brain repeated 01/25 and revealed interval development of 37m acute infarct in right frontal parietal motor cortex, chronic right frontal and right occipital infarcts, bilateral PCA stenosis and loss of flow in R-M1 stent --unchanged since 2020. She received tPA and follow up MRI showed two foci of acute parenchymal hemorrhage in mid to posterior right  frontal lobe with mild surrounding edema and 2 unchanged acute/subacute right frontal lobe infarcts.   Prior 2D echo 12/2019 EF 56% with moderate concentric hypertrophy and well seated aortic valve. TEE not needed. All antiplatelets held due to hemorrhagic conversion and with question plans to start ASA today. Question transient episode of A fib on 01/30--Loop recorder placed 02/17/2020 for work up of cryptogenic stroke--need to keep incision C/D for 3 days. Therapy ongoing and patient continues to be limited by Left sided weakness affecting ability to carry out ADLs and mobility. Patient transferred to CIR on 02/17/2020 .   Pt presents with decreased activity tolerance, decreased functional mobility, decreased balance, decreased coordination Limiting pt's independence with leisure/community pursuits.  Met with pt today to discuss leisure interests, activity analysis/modification and coping strategies.  Plan  Min 1 TR session >20 minutes per week during LOS  Recommendations for other services: None   Discharge Criteria: Patient will be discharged from TR if patient refuses treatment 3 consecutive times without medical reason.  If treatment goals not met, if there is a change in medical status, if patient makes no progress towards goals or if patient is discharged from hospital.  The above assessment, treatment plan, treatment alternatives and goals were discussed and mutually agreed upon: by patient  SLonerock2/03/2020, 8:48 AM

## 2020-02-20 NOTE — Progress Notes (Signed)
Occupational Therapy Session Note  Patient Details  Name: [name] MRN: [id number] Date of Birth: [birth year]  Today's Date: 02/20/2020 OT Individual Time: 1101-1154 OT Individual Time Calculation (min): 53 min    Short Term Goals: Week 1:  OT Short Term Goal 1 (Week 1): STG = LTG 2/2 ELOS  Skilled Therapeutic Interventions/Progress Updates:    Pt received in w/c with TR present, agreeable to therapy. Session focus on showering, bathing, DME recs. STS throughout session with close S + min VCs to push up from arm rests. Amb to and from bathroom with RW + close S. Completed toilet transfer with close S, continent void of bladder and close S for seated peri care. TTB transfer with RW + close S + min VCs for sequencing. Bathed full body seated with close S. Donned/doffed shirt, pants, underwear with close S, min VCs to push up from arm rests for STS. Donned B shoes with close S, total A to tie. Completed walk-in shower transfer with 3" wooden lip + shower chair with close S after visual demonstration. Discussed where to purchase shower chairs and energy conservation techniques for showering (gather all items needed and drape on walker, use robe to dry off, liquid soap vs bar soap to decrease fall risk, etc.). Amb back to room with close S + RW. Pt left in w/c with safety belt alarm engaged, call bell in reach, and all immediate needs met.    Therapy Documentation Precautions:  Precautions Precautions: Fall,Other (comment) Precaution Comments: LLE weakness, L eye retinopathy, AV fistula L arm, new LOOP recorder placement Restrictions Weight Bearing Restrictions: No Pain: Pain Assessment Pain Scale: 0-10 Pain Score: 0-No pain ADL: See Care Tool for more details.   Therapy/Group: Individual Therapy  Volanda Napoleon MS, OTR/L  02/20/2020, 12:21 PM

## 2020-02-20 NOTE — Progress Notes (Signed)
Physical Therapy Session Note  Patient Details  Name: [name] MRN: [id number] Date of Birth: [birth year]  Today's Date: 02/20/2020 PT Individual Time: 1505-1530 PT Individual Time Calculation (min): 25 min   Short Term Goals: Week 1:  PT Short Term Goal 1 (Week 1): STG = LTG d/t ELOS  Skilled Therapeutic Interventions/Progress Updates:  Pt received sitting in WC and agreeable to PT. Gait training with RW with supervision assist x 152f to rehab gym. Cues for increased step width on the LLE. PT instructed pt in dynamic gait training to step over 3 canes x floor x2 with RW and x 2 with no AD. Supervision assist with RW and CGA from PT with no AD. Cues for AD management and improved step length intermittently with and without AD.  Dynamic balance training to perform star drill with 4 targets to step out to target and back to midline performed 2x 10 BLE with cues for increased step length and proper weight shift to prevent lateral LOB.  Gait training back to room with no AD x 1594fand CGA-supervision assist from PT for safety. Min cues for posture and step width in turns. Pt returned to room and performed ambulatory transfer to bed with supervision assist. Sit>supine completed without assist, and left supine in bed with call bell in reach and all needs met.         Therapy Documentation Precautions:  Precautions Precautions: Fall,Other (comment) Precaution Comments: LLE weakness, L eye retinopathy, AV fistula L arm, new LOOP recorder placement Restrictions Weight Bearing Restrictions: No    Vital Signs: Therapy Vitals Temp: (!) 97.4 F (36.3 C) Temp Source: Oral Pulse Rate: 79 Resp: 18 BP: (!) 119/49 Patient Position (if appropriate): Standing Oxygen Therapy SpO2: 100 % O2 Device: Room Air Pain: Pain Assessment Pain Scale: 0-10 Pain Score: 0-No pain  Therapy/Group: Individual Therapy  AuLorie Phenix/03/2020, 4:08 PM

## 2020-02-20 NOTE — Progress Notes (Signed)
Physical Therapy Session Note  Patient Details  Name: [name] MRN: [id number] Date of Birth: 07/05/1940  Today's Date: 02/20/2020 PT Individual Time: 0920-1030 PT Individual Time Calculation (min): 70 min   Short Term Goals: Week 1:  PT Short Term Goal 1 (Week 1): STG = LTG d/t ELOS  Skilled Therapeutic Interventions/Progress Updates:   Pt received supine in bed and agreeable to PT. Supine>sit transfer without assist or cues from PT and min use of bed rail. Ambulatory transfer to bathroom with RW and supervision assist. Pt able to performed clothing management and peri care with distant supervision assist from PT for safety. Upper and lower body dressing with set up assist from PT for safety.  Oral hygiene standing at sink with supervision assist for safety and problem solving for management of toothbrush and tooth paste.   Gait training through hall with supervision assist and UE support on RW 2 x 132f. Cues for increased speed as tolerated with minimal change in gait speed. Dynamic gait training to perform tug x 2: average 38 sec with no AD and CGA. Side stepping at rail 2 x 167fwith CGA and mild UE support. Forward/reverse gait 2 x 1583fith no UE support and min assist. Weave through 4 cones then 5 cones x 2 each with no AD and min assist. Cues for wider BOS and increased cadence as tolerated.    Supine NMR:  SLR, hip abduction, hip extension from flexed position, reciprocal march, pelvic rotation. Each completed x 10-12 with cues for decreased speed and full ROM.   Sit<>supine from mat table with supervision assist and cues for sequencing and positioning of LLE throughout.   Nustep reciprocal movement/endurance training with cues for attention to the LUE and full ROM; x 6 min, level 5. Supervision assist for transfers to and from nustep with no AD.   Patient returned to room and [facility] reach and all needs met.          Therapy  Documentation Precautions:  Precautions Precautions: Fall,Other (comment) Precaution Comments: LLE weakness, L eye retinopathy, AV fistula L arm, new LOOP recorder placement Restrictions Weight Bearing Restrictions: No   Pain: Pain Assessment Pain Scale: 0-10 Pain Score: 0-No pain Faces Pain Scale: No hurt    Therapy/Group: Individual Therapy  AusLorie Phenix3/2022, 10:37 AM

## 2020-02-21 LAB — BASIC METABOLIC PANEL
Anion gap: 13 (ref 5–15)
BUN: 51 mg/dL — ABNORMAL HIGH (ref 8–23)
CO2: 21 mmol/L — ABNORMAL LOW (ref 22–32)
Calcium: 9.1 mg/dL (ref 8.9–10.3)
Chloride: 104 mmol/L (ref 98–111)
Creatinine, Ser: 2.67 mg/dL — ABNORMAL HIGH (ref 0.44–1.00)
GFR, Estimated: 17 mL/min — ABNORMAL LOW (ref >60)
Glucose, Bld: 93 mg/dL (ref 70–99)
Potassium: 3.7 mmol/L (ref 3.5–5.1)
Sodium: 138 mmol/L (ref 135–145)

## 2020-02-21 LAB — GLUCOSE, CAPILLARY
Glucose-Capillary: 80 mg/dL (ref 70–99)
Glucose-Capillary: 104 mg/dL — ABNORMAL HIGH (ref 70–99)
Glucose-Capillary: 153 mg/dL — ABNORMAL HIGH (ref 70–99)
Glucose-Capillary: 109 mg/dL — ABNORMAL HIGH (ref 70–99)

## 2020-02-21 NOTE — Progress Notes (Signed)
Recreational Therapy Session Note  Patient Details  Name: [name] MRN: [id number] Date of Birth: October 09, 1940 Today's Date: 02/21/2020  Pain: no c/o Skilled Therapeutic Interventions/Progress Updates: Session focused on discharge planning, energy conservation, & community reintegration.  Also discussed home modifications to reduce fall risks as pt stated she had a fear of falling.  Discussed fall recovery process for home if a fall is experienced. Pt stated understanding & appreciation of the above information.  PT made aware of pts fear of falling for further discussion, demonstration and/or floor transfer if appropriate. Therapy/Group: Individual Therapy   Jaser Fullen 02/21/2020, 12:00 PM

## 2020-02-21 NOTE — Progress Notes (Signed)
Occupational Therapy Session Note  Patient Details  Name: [name] MRN: [id number] Date of Birth: 1940/12/20  Today's Date: 02/21/2020 OT Individual Time: 1000-1054 OT Individual Time Calculation (min): 54 min    Short Term Goals: Week 1:  OT Short Term Goal 1 (Week 1): STG = LTG 2/2 ELOS  Skilled Therapeutic Interventions/Progress Updates:    Pt received in w/c with TR, present, agreeable to therapy, requesting to shower. Pt amb to and from bathroom and complete toilet and TTB transfer with RW + close S for safety 2/2 decreased L step length. Cont void of b/b. Seated peri care with close S. Bathes full-body with close S + use of hand held shower held, lateral leans to clean buttocks. Doffs/donns shirt, underwear, pants, and shoes with close S seated in w/c. Pt able to tie both shoes with increased time this date with use of B hands. Standing at sink, groomed hair with no BUE support with close S. Provided walker bag and discussed kitchen safety with RW to decrease falls risk. After visual demonstration, pt collected plate from upper cabinet and transported it down counter with RW + close S. Good carry over of safe RW use after min VCs (stay inside the RW, bring it all the way around with you during turns). Additionally discussed further energy conservation strategies re cooking and grooming (do tasks seated, dress in chair with arm rests to facilitate STS, etc). Pt verbalized understanding and states she has a chair with arm rests she can get dressed in. Pt left in w/c with safety belt alarm engaged, call bell in reach, and all immediate needs met.    Therapy Documentation Precautions:  Precautions Precautions: Fall,Other (comment) Precaution Comments: LLE weakness, L eye retinopathy, AV fistula L arm, new LOOP recorder placement Restrictions Weight Bearing Restrictions: No Pain: Pain Assessment Pain Scale: 0-10 Pain Score: 0-No pain ADL: See Care Tool for more details      Therapy/Group: Individual Therapy  Volanda Napoleon MS, OTR/L  02/21/2020, 10:56 AM

## 2020-02-21 NOTE — Progress Notes (Signed)
Physical Therapy Session Note  Patient Details  Name: [name] MRN: [id number] Date of Birth: 1940/03/31  Today's Date: 02/21/2020 PT Individual Time: 7353-2992 PT Individual Time Calculation (min): 35 min   Short Term Goals: Week 1:  PT Short Term Goal 1 (Week 1): STG = LTG d/t ELOS  Skilled Therapeutic Interventions/Progress Updates:   Gait training with RW through hall 2 x 251f with supervision assist. Slow gait speed and narrow BOS throughout, with mild improvement following cues from PT. Dynamic balance training while engaged in fine motor task of wii bowling, pt performed 3 frames in flat surface and 7 frames standing on red wedge. Supervision assist while on flat surface with no UE support min-CGA from PT while standing on wedge with intermittent UE support on RW. Cues throughout to improve WB through forefoot and use of ankle stratgy to prevent posterior LOB. [facility] returned to [facility] and [facility] and all needs met.        Therapy Documentation Precautions:  Precautions Precautions: Fall,Other (comment) Precaution Comments: LLE weakness, L eye retinopathy, AV fistula L arm, new LOOP recorder placement Restrictions Weight Bearing Restrictions: No   Pain:    Denies    Therapy/Group: Individual Therapy  ALorie Phenix2/04/2020, 4:41 PM

## 2020-02-21 NOTE — Progress Notes (Signed)
Physical Therapy Session Note  Patient Details  Name: [name] MRN: [id number] Date of Birth: May 19, 1940  Today's Date: 02/21/2020 PT Individual Time: [id number] PT Individual Time Calculation (min): 60 min   Short Term Goals: Week 1:  PT Short Term Goal 1 (Week 1): STG = LTG d/t ELOS  Skilled Therapeutic Interventions/Progress Updates:  Patient seated upright in w/c upon PT arrival. Patient alert and agreeable to PT session. No pain complaint throughout session.  Therapeutic Activity: Transfers: Patient performed STS, SPVT and toilet transfers during session all at supervision level. VC provided for safe hand progression. Pt also guided in floor transfer requiring verbal instruction along with visual demonstration prior to attempt. She is able to lower self to mat on floor using mat table to BUE support and reaches supine position. Simultaneous vc/tc provided  for step-wise progression in reaching quadriped, reaching kneeling at mat table and rising to stand. All performed with supervision.  Gait Training:  Patient ambulated 135' x1/ 180' x1 with RW and supervision with vc provided intermittently for increasing LLE step length and widening BOS. Ambulated 175' x2 with no AD and SBA. Pt continues to demo decreased arm swing without cues d/t increase in conscious focus on balance and quality of gait. With no AD, she is guided in dynamic gait including head turns. No change in path with vertical changes in head position, but path deviates moderately with horizontal head turns causing pt to reach out for UE support, decrease speed, and decrease step length bilaterally.    Neuromuscular Re-ed: NMR facilitated during session with focus on standing balance. Pt guided in use of rebounder to challenge motor planning as well as proactive and reactive balance. Completes 2-48mn bouts with rebounder at 2 different angles and 2 distances. She is able to figure "sweet spot" of rebounder with intensity of  throw within 3 attempts each bout. Pt also guided in [employer] activities including maze, pattern, and throw/ catch game  On Level 12 with improving accuracy to 85% with 2sec reaction time by final activity. NMR performed for improvements in motor control and coordination, balance, sequencing, judgement, and self confidence/ efficacy in performing all aspects of mobility at highest level of independence.   Patient seated in w/c at end of session with brakes locked, chair alarm set, and all needs within reach.    Therapy Documentation Precautions:  Precautions Precautions: Fall,Other (comment) Precaution Comments: LLE weakness, L eye retinopathy, AV fistula L arm, new LOOP recorder placement Restrictions Weight Bearing Restrictions: No    Therapy/Group: Individual Therapy  JAlger Simons2/04/2020, 4:56 PM

## 2020-02-21 NOTE — Progress Notes (Signed)
Physical Therapy Session Note  Patient Details  Name: [name] MRN: [id number] Date of Birth: 1940-09-20  Today's Date: 02/21/2020 PT Individual Time: 0800-0900 PT Individual Time Calculation (min): 60 min   Short Term Goals: Week 1:  PT Short Term Goal 1 (Week 1): STG = LTG d/t ELOS  Skilled Therapeutic Interventions/Progress Updates:  Patient seated on EOB upon PT arrival. Patient alert and agreeable to PT session. No pain complaint throughout session. She requires assist to complete dressing for the day. Pt dresses UB and LB at EOB with setup. Requires Mod A to don shoes but is able to tie them with independence.   Therapeutic Activity: Transfers: Patient performed STS transfers throughout with supervision w/c <> RW as well as w/c to no AD. VC required intermittently for safe hand progression and one instance of vc required for forward lean required to bring COG over BOS.   Gait Training:  Patient ambulated distances >175 throughout session with RW and without AD. Requires supervision with RW and SBA with no AD with additional vc for widening BOS and intermittent need to increase L step length.   Also guided in step negotiation for 6" steps x4 using Bil HR. Pt demos improvement in one week and steps with reciprocal, but slow gait pattern to ascend as well as descend the steps twice. Completes with SBA and VC for use of unaffected LE to lead in ascending and affected LE to lead in descending for first bout only. Is able to correctly demo in second bout with no cues. Pt states her fear of completing steps has reduced since [facility].    Neuromuscular Re-ed: NMR facilitated during session with during functional tasks as well as in dynamic balance during ladder drills. Static standing balance during personal care task at sink with no bodily support or assist and is able to reach outside BOS and cross body for >28mnutes and no LOB or increase in sway noted. Completed with supervision. Pt guided  in use of floor ladder for stepping with step-to pattern with SBA, reciprocal pattern with light CGA, lateral stepping x2 with light CGA, and then in a task more challenging for motor planning and execution with verbally cued direction for turning 90 degree turns, fwd/ bkwd steps, lateral steps, and diagonal forward stepping in/ out of ladder in order to challenge motor planning and initiation. Final task required up to quick Min A for balance in order to complete. Two instances of crossover stepping used as step strategy to maintain balance requiring moderate UE support from pt to assist in correcting.  NMR performed for improvements in motor control and coordination, balance, sequencing, judgement, and self confidence/ efficacy in performing all aspects of mobility at highest level of independence.   Patient seated upright in w/c at end of session with brakes locked, chair alarm set, and all needs within reach.     Therapy Documentation Precautions:  Precautions Precautions: Fall,Other (comment) Precaution Comments: LLE weakness, L eye retinopathy, AV fistula L arm, new LOOP recorder placement Restrictions Weight Bearing Restrictions: No  Therapy/Group: Individual Therapy  JAlger Simons2/04/2020, 9:02 AM

## 2020-02-21 NOTE — Progress Notes (Signed)
[facility] PHYSICAL MEDICINE & REHABILITATION PROGRESS NOTE   Subjective/Complaints:  Pt asking about K+, discussed elevated BUN need to drink more liquid   ROS- neg CP, SOB, N/V/D Objective:   No results found. No results for input(s): WBC, HGB, HCT, PLT in the last 72 hours. Recent Labs    02/21/20 0524  NA 138  K 3.7  CL 104  CO2 21*  GLUCOSE 93  BUN 51*  CREATININE 2.67*  CALCIUM 9.1    Intake/Output Summary (Last 24 hours) at 02/21/2020 0758 Last data filed at 02/20/2020 1725 Gross per 24 hour  Intake 474 ml  Output -  Net 474 ml        Physical Exam: Vital Signs Blood pressure (!) 134/57, pulse (!) 58, temperature 97.8 F (36.6 C), temperature source Oral, resp. rate 18, height '5\' 3"'$  (1.6 m), weight 70.8 kg, SpO2 97 %.   General: No acute distress Mood and affect are appropriate Heart: Regular rate and rhythm no rubs gr 3/6 SEM Lungs: Clear to auscultation, breathing unlabored, no rales or wheezes Abdomen: Positive bowel sounds, soft nontender to palpation, nondistended Extremities: No clubbing, cyanosis, or edema Skin: No evidence of breakdown, no evidence of rash   Neurologic: Cranial nerves II through XII intact, motor strength is 5/5 in right  And 3/5 left  deltoid, bicep, tricep, grip, 5/5 Right and 2- Left hip flexor, knee extensors, 5/5 R and 0/5 L ankle dorsiflexor and plantar flexor Slower with Larsh to thumb opposition on the left side  Musculoskeletal: Full range of motion in all 4 extremities. No joint swelling  Assessment/Plan: 1. Functional deficits which require 3+ hours per day of interdisciplinary therapy in a comprehensive inpatient rehab setting.  Physiatrist is providing close team supervision and 24 hour management of active medical problems listed below.  Physiatrist and rehab team continue to assess barriers to discharge/monitor patient progress toward functional and medical goals  Care Tool:  Bathing    Body parts bathed  by patient: Right arm,Face,Left arm,Chest,Front perineal area,Abdomen,Buttocks,Right upper leg,Left upper leg,Right lower leg,Left lower leg         Bathing assist Assist Level: Supervision/Verbal cueing     Upper Body Dressing/Undressing Upper body dressing   What is the patient wearing?: Pull over shirt    Upper body assist Assist Level: Supervision/Verbal cueing    Lower Body Dressing/Undressing Lower body dressing      What is the patient wearing?: Underwear/pull up,Pants     Lower body assist Assist for lower body dressing: Supervision/Verbal cueing     Toileting Toileting    Toileting assist Assist for toileting: Supervision/Verbal cueing     Transfers Chair/bed transfer  Transfers assist     Chair/bed transfer assist level: Supervision/Verbal cueing     Locomotion Ambulation   Ambulation assist      Assist level: Supervision/Verbal cueing Assistive device: Walker-rolling Max distance: ~70 ft   Walk 10 feet activity   Assist     Assist level: Contact Guard/Touching assist Assistive device: No Device   Walk 50 feet activity   Assist    Assist level: Contact Guard/Touching assist Assistive device: No Device    Walk 150 feet activity   Assist Walk 150 feet activity did not occur: Safety/medical concerns  Assist level: Contact Guard/Touching assist Assistive device: No Device    Walk 10 feet on uneven surface  activity   Assist Walk 10 feet on uneven surfaces activity did not occur: Safety/medical concerns  Wheelchair     Assist Will patient use wheelchair at discharge?: No             Wheelchair 50 feet with 2 turns activity    Assist            Wheelchair 150 feet activity     Assist          Blood pressure (!) 134/57, pulse (!) 58, temperature 97.8 F (36.6 C), temperature source Oral, resp. rate 18, height '5\' 3"'$  (1.6 m), weight 70.8 kg, SpO2 97 %.     Medical Problem List and  Plan: 1.  Impaired mobility and ADLs secondary to right ACA infarcts             -patient may shower             -ELOS/Goals: 2/11 Cont CIR PT, OT 2.  Antithrombotics: -DVT/anticoagulation:  Mechanical: Sequential compression devices, below knee Bilateral lower extremities             -antiplatelet therapy: Transient thrombocytopenia has resolved. ASA to start? 3. Pain Management: tylenol prn for back/right hip pain.  4. Mood: LCSW to follow for evaluation and support.              -antipsychotic agents: N/A 5. Neuropsych: This patient is capable of making decisions on her own behalf. 6. Skin/Wound Care: Routine pressure relief measures  7. Fluids/Electrolytes/Nutrition: Monitor I/O.  Hypokalemia due to torsemide will supplement low dose KCL , recheck later in the week  8. CAD/TVAR: Followed by Dr. Virgina Jock. Plavix was d/c due to bleed. Continue Imdur, Crestor and Demadex. 9. HTN: Monitor BP tid--continue Amlodipine, Demadex, Hydralazine and Imdur.  Vitals:   02/21/20 0500 02/21/20 0536  BP:  (!) 134/57  Pulse:  (!) 58  Resp:  18  Temp: 97.8 F (36.6 C)   SpO2:  97%  controlled 2/4 9. T2DM with neuropathy/retionopathy: Hgb A1c- 7.7.CBGs ranging 70-160.               Continue Lantus 10 units with SSI for elevated BS. Will monitor BS ac/hs  CBG (last 3)  Recent Labs    02/20/20 1630 02/20/20 2025 02/21/20 0553  GLUCAP 130* 120* 80  controlled   10. CKD III/IV: BUN/SCr  30/2.44  at admissionelevated BUN to 51 , Creat a little up  at 2.67       [facility] Weights   02/19/20 0615 02/20/20 0606 02/21/20 0536  Weight: 70 kg 70.9 kg 70.8 kg  Will monitor weight daily for trend. Looks stable 2/4  LOS: 4 days A FACE TO [place] E Kirsteins 02/21/2020, 7:58 AM

## 2020-02-22 DIAGNOSIS — E876 Hypokalemia: Secondary | ICD-10-CM

## 2020-02-22 DIAGNOSIS — I63421 Cerebral infarction due to embolism of right anterior cerebral artery: Secondary | ICD-10-CM

## 2020-02-22 DIAGNOSIS — E1142 Type 2 diabetes mellitus with diabetic polyneuropathy: Secondary | ICD-10-CM

## 2020-02-22 DIAGNOSIS — I10 Essential (primary) hypertension: Secondary | ICD-10-CM

## 2020-02-22 LAB — GLUCOSE, CAPILLARY
Glucose-Capillary: 77 mg/dL (ref 70–99)
Glucose-Capillary: 70 mg/dL (ref 70–99)
Glucose-Capillary: 222 mg/dL — ABNORMAL HIGH (ref 70–99)
Glucose-Capillary: 90 mg/dL (ref 70–99)

## 2020-02-22 NOTE — Progress Notes (Signed)
Physical Therapy Session Note  Patient Details  Name: [name] MRN: [id number] Date of Birth: 14-Apr-1940  Today's Date: 02/22/2020 PT Individual Time: 0800-0900 PT Individual Time Calculation (min): 60 min   Short Term Goals: Week 1:  PT Short Term Goal 1 (Week 1): STG = LTG d/t ELOS  Skilled Therapeutic Interventions/Progress Updates:    Patient seated on EOB upon PT arrival. Patient alert and agreeable to PT session. No pain complaint throughout session.   Therapeutic Activity: Transfers: Following extended periods of rest including overnight and breaks between therapy, pt requires some level of increase in conscious, focus planning prior to movement for safety. Patient performed STS and SPVT/ toilet transfers throughout session with near Mod I.  Supervision required d/t pt's intermittent instability and   Gait Training:  Pt states at start of session that she wonders if she will be able to walk into the [employer] from the car again. Pt educated on use of rollator and provided with education on difference of rollator to RW. Instructed on use of brakes and functional use of brakes during ambulation as well as prior to all transfers.  Ambulated with supervision >250 feet x2. Provided verbal cues throughout for use of brakes and decreased downward pressure for improved safety with use of rollator.  Pt guided in obstacle course in room with functional collection of items in order to ready self for therapy session using RW. Completed cleanup with supervision/ SBA and no LOB noted. Then guided in obstacle course in therapy gym with no AD to collect cones placed between floor and shoulder height in tight spaces throughout the gym and requiring step onto Airex pad, over weighted bars, and around cones. Performed with close SBA. No LOB noted. Next obstacle course with use of rollator in therapy gym with pt completing with SBA/ supervision and finishing with seat in rollator. VC for safe brake  application prior to transfers.  Patient seated upright in w/c at end of session with brakes locked, belt alarm set, and all needs within reach.  Therapy Documentation Precautions:  Precautions Precautions: Fall,Other (comment) Precaution Comments: LLE weakness, L eye retinopathy, AV fistula L arm, new LOOP recorder placement Restrictions Weight Bearing Restrictions: No   Therapy/Group: Individual Therapy  Alger Simons 02/22/2020, 11:27 AM

## 2020-02-22 NOTE — Progress Notes (Signed)
Physical Therapy Session Note  Patient Details  Name: [name] MRN: [id number] Date of Birth: 12-03-1940  Today's Date: 02/22/2020 PT Individual Time: 1301-1415 PT Individual Time Calculation (min): 74 min   Short Term Goals: Week 1:  PT Short Term Goal 1 (Week 1): STG = LTG d/t ELOS  Skilled Therapeutic Interventions/Progress Updates:   Pt received sitting in WC and agreeable to PT. Gait training through hall with rollator and distant supervision assist from PT with min cues from PT for safety of AD part management and turns. Additional gait training with Rollator through [facility] [id number]+300ft with distant supervision assist with cues as listed above.   Dynamic balance training: Ball raise/lower x 10 BUE level surface.  Forward ball toss x 45 sec from airex pad.  Lateral rotation with ball followed by ball toss to contralateral side off rebounder; x 15 Bil, standing on airex pad. CGA-min assist throughout dynamic balance training with 2 instances of posterior LOB   Stair management training with no UE support x 8 steps with min assist progressing to [place] Cues for increased step length with descent and then able to perform reciprocal step with ascent.   Neuromotor re-ed:  Lateal step ups on 6" step x 8 BLE with BUE support on rails.  Forward/reverse gait with rollator 112fx 2, and no AD 1036f.  Side stepping R and L with no AD 2 x [id number]. CGA-supervision assist from PT for safety and cues for increased step length and width intermittently.   Performed dynamic gait training to navigate ramp and mulch with rollator and CGA-supervision assist.   [facility] returned to room and [facility] and all needs met.        Therapy Documentation Precautions:  Precautions Precautions: Fall,Other (comment) Precaution Comments: LLE weakness, L eye retinopathy, AV fistula L arm, new LOOP recorder placement Restrictions Weight Bearing  Restrictions: No Vital Signs: Therapy Vitals Temp: 97.7 F (36.5 C) Pulse Rate: 60 Resp: 16 BP: (!) 139/53 Patient Position (if appropriate): Sitting Oxygen Therapy SpO2: 100 % O2 Device: Room Air Pain: Pain Assessment Pain Scale: 0-10 Pain Score: 0-No pain    Therapy/Group: Individual Therapy  AustLorie Phenix/2022, 2:18 PM

## 2020-02-22 NOTE — Progress Notes (Signed)
[place] PHYSICAL MEDICINE & REHABILITATION PROGRESS NOTE   Subjective/Complaints: Patient seen ambulating with therapies this AM.  She states she slept well overnight.  She denies complains.   ROS; Denies CP, SOB, N/V/D  Objective:   No results found. No results for input(s): WBC, HGB, HCT, PLT in the last 72 hours. Recent Labs    02/21/20 0524  NA 138  K 3.7  CL 104  CO2 21*  GLUCOSE 93  BUN 51*  CREATININE 2.67*  CALCIUM 9.1    Intake/Output Summary (Last 24 hours) at 02/22/2020 1145 Last data filed at 02/22/2020 0800 Gross per 24 hour  Intake 714 ml  Output --  Net 714 ml        Physical Exam: Vital Signs Blood pressure (!) 152/52, pulse (!) 59, temperature 98.4 F (36.9 C), temperature source Oral, resp. rate 18, height '5\' 3"'$  (1.6 m), weight 71.2 kg, SpO2 96 %. Constitutional: No distress . Vital signs reviewed. HENT: Normocephalic.  Atraumatic. Eyes: EOMI. No discharge. Cardiovascular: No JVD.  RRR. +Murmur.  Respiratory: Normal effort.  No stridor.  Bilateral clear to auscultation. GI: Non-distended.  BS +. Skin: Warm and dry.  Intact. Psych: Normal mood.  Normal behavior. Musc: No edema in extremities.  No tenderness in extremities. Neurologic: Alert Motor: 5/5 RUE/RLE 3/5 left deltoid, bicep, tricep, grip, 5/5 Right and 2- Left hip flexor, knee extensors, 5/5 R and 0/5 L ankle dorsiflexor and plantar flexor  Assessment/Plan: 1. Functional deficits which require 3+ hours per day of interdisciplinary therapy in a comprehensive inpatient rehab setting.  Physiatrist is providing close team supervision and 24 hour management of active medical problems listed below.  Physiatrist and rehab team continue to assess barriers to discharge/monitor patient progress toward functional and medical goals  Care Tool:  Bathing    Body parts bathed by patient: Right arm,Face,Left arm,Chest,Front perineal area,Abdomen,Buttocks,Right upper leg,Left upper leg,Right  lower leg,Left lower leg         Bathing assist Assist Level: Supervision/Verbal cueing     Upper Body Dressing/Undressing Upper body dressing   What is the patient wearing?: Pull over shirt    Upper body assist Assist Level: Supervision/Verbal cueing    Lower Body Dressing/Undressing Lower body dressing      What is the patient wearing?: Underwear/pull up,Pants     Lower body assist Assist for lower body dressing: Supervision/Verbal cueing     Toileting Toileting    Toileting assist Assist for toileting: Supervision/Verbal cueing     Transfers Chair/bed transfer  Transfers assist     Chair/bed transfer assist level: Supervision/Verbal cueing     Locomotion Ambulation   Ambulation assist      Assist level: Supervision/Verbal cueing Assistive device: No Device Max distance: 175 ft   Walk 10 feet activity   Assist     Assist level: Supervision/Verbal cueing Assistive device: No Device   Walk 50 feet activity   Assist    Assist level: Supervision/Verbal cueing Assistive device: No Device    Walk 150 feet activity   Assist Walk 150 feet activity did not occur: Safety/medical concerns  Assist level: Supervision/Verbal cueing Assistive device: No Device    Walk 10 feet on uneven surface  activity   Assist Walk 10 feet on uneven surfaces activity did not occur: Safety/medical concerns         Wheelchair     Assist Will patient use wheelchair at discharge?: No  Wheelchair 50 feet with 2 turns activity    Assist            Wheelchair 150 feet activity     Assist          Blood pressure (!) 152/52, pulse (!) 59, temperature 98.4 F (36.9 C), temperature source Oral, resp. rate 18, height '5\' 3"'$  (1.6 m), weight 71.2 kg, SpO2 96 %.     Medical Problem List and Plan: 1.  Impaired mobility and ADLs secondary to right ACA infarcts  Continue CIR 2.  Antithrombotics: -DVT/anticoagulation:   Mechanical: Sequential compression devices, below knee Bilateral lower extremities             -antiplatelet therapy: Transient thrombocytopenia has resolved. ASA to start? 3. Pain Management: tylenol prn for back/right hip pain.  4. Mood: LCSW to follow for evaluation and support.              -antipsychotic agents: N/A 5. Neuropsych: This patient is capable of making decisions on her own behalf. 6. Skin/Wound Care: Routine pressure relief measures  7. Fluids/Electrolytes/Nutrition: Monitor I/Os.  8. CAD/TVAR: Followed by Dr. Virgina Jock. Plavix was d/c due to bleed. Continue Imdur, Crestor and Demadex. 9. HTN: Monitor BP tid--continue Amlodipine, Demadex, Hydralazine and Imdur.  Vitals:   02/21/20 1951 02/22/20 0426  BP: (!) 133/46 (!) 152/52  Pulse: (!) 58 (!) 59  Resp: 20 18  Temp: 98.3 F (36.8 C) 98.4 F (36.9 C)  SpO2: 99% 96%   +Orthostasis on 2/5 9. T2DM with neuropathy/retionopathy: Hgb A1c- 7.7.CBGs ranging 70-160.               Continue Lantus 10 units with SSI for elevated BS. Will monitor BS ac/hs  CBG (last 3)  Recent Labs    02/21/20 1650 02/21/20 2055 02/22/20 0619  GLUCAP 153* 109* 77   Relatively controlled on 2/5 10. CKD III/IV:   Filed Weights   02/20/20 0606 02/21/20 0536 02/22/20 0426  Weight: 70.9 kg 70.8 kg 71.2 kg   Cr. 2.67 on 2/4, labs ordered for Monday  Encourage fluids 11. Hypokalemia  K+ 3.7 on 2/4  Cont supplement  LOS: 5 days A FACE TO FACE EVALUATION WAS PERFORMED  [name] [name] 02/22/2020, 11:45 AM

## 2020-02-22 NOTE — Progress Notes (Signed)
Occupational Therapy Session Note  Patient Details  Name: [name] MRN: [id number] Date of Birth: 07/19/1940  Today's Date: 02/22/2020 OT Individual Time: 1007-1100 OT Individual Time Calculation (min): 53 min    Short Term Goals: Week 1:  OT Short Term Goal 1 (Week 1): STG = LTG 2/2 ELOS  Skilled Therapeutic Interventions/Progress Updates:    Pt received seated in w/c, requesitng to shower, agreeable to therapy. Picked out clothes from bags with set-up. Doffed B shoes with distant S. STS with set-up and amb to bathroom and completed toilet and TTB transfer with close S. Cont void of b, distant S for seated peri care + clothing management. In shower, bathed FB with LH sponge + hand held shower with distant S. Close S for TTB transfer + mod VCs for sequencing as pt attempts to stand-up before moving BLE out of shower first. Seated, doffed/donned shirt, underwear, pants with distant S. 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 Discussed recommendations to have a second person present when taking care of grandchildren (ages 9 and 2) and to have brother assist with dogs 2/2 increased fall risk. Pt verbalizes agreeable and states one of her dogs will be staying with someone else for awhile. Pt left seated in w/c with safetyalarm engaged, call bell in reach, and all immediate needs met.    Therapy Documentation Precautions:  Precautions Precautions: Fall,Other (comment) Precaution Comments: LLE weakness, L eye retinopathy, AV fistula L arm, new LOOP recorder placement Restrictions Weight Bearing Restrictions: No Pain: Pain Assessment Pain Scale: 0-10 Pain Score: 0-No pain ADL: See Care Tool for more details.  Therapy/Group: Individual Therapy  Volanda Napoleon MS, OTR/L  02/22/2020, 10:25 AM

## 2020-02-22 NOTE — Progress Notes (Signed)
Occupational Therapy Session Note  Patient Details  Name: [name] MRN: [id number] Date of Birth: 1940/11/06  Today's Date: 02/22/2020 OT Group Time: 1100-1200 OT Group Time Calculation (min): 60 min  Skilled Therapeutic Interventions/Progress Updates:    Pt engaged in therapeutic w/c level dance group focusing on patient choice, UE/LE strengthening, salience, activity tolerance, and social participation. Pt was guided through various dance-based exercises involving UEs/LEs and trunk. All music was selected by group members. Emphasis placed on Lt NMR and activity tolerance. Pt declined standing today though was provided with the opportunity. She participated well while seated, actively involving her affected side during exercises, socially interacting with other group members and encouraging them. At end of session she was returned to the room by RT.    Therapy Documentation Precautions:  Precautions Precautions: Fall,Other (comment) Precaution Comments: LLE weakness, L eye retinopathy, AV fistula L arm, new LOOP recorder placement Restrictions Weight Bearing Restrictions: No Vital Signs: Therapy Vitals Temp: 97.7 F (36.5 C) Pulse Rate: 60 Resp: 16 BP: (!) 139/53 Patient Position (if appropriate): Sitting Oxygen Therapy SpO2: 100 % O2 Device: Room Air Pain: no s/s pain during tx Pain Assessment Pain Scale: 0-10 Pain Score: 0-No pain ADL: ADL Eating: Supervision/safety Where Assessed-Eating: Edge of bed Grooming: Supervision/safety Where Assessed-Grooming: Edge of bed Upper Body Bathing: Supervision/safety Where Assessed-Upper Body Bathing: Edge of bed Lower Body Bathing: Minimal assistance Where Assessed-Lower Body Bathing: Edge of bed Upper Body Dressing: Supervision/safety Where Assessed-Upper Body Dressing: Edge of bed Lower Body Dressing: Minimal assistance Where Assessed-Lower Body Dressing: Edge of bed Toileting: Minimal assistance Where Assessed-Toileting:  Glass blower/designer: Psychiatric nurse Method: Arts development officer: Extra wide Radiographer, therapeutic Method: Psychologist, educational: Recruitment consultant: Transfer tub bench     Therapy/Group: Group Therapy  [employer] 02/22/2020, 3:59 PM

## 2020-02-23 LAB — GLUCOSE, CAPILLARY
Glucose-Capillary: 112 mg/dL — ABNORMAL HIGH (ref 70–99)
Glucose-Capillary: 142 mg/dL — ABNORMAL HIGH (ref 70–99)
Glucose-Capillary: 142 mg/dL — ABNORMAL HIGH (ref 70–99)
Glucose-Capillary: 164 mg/dL — ABNORMAL HIGH (ref 70–99)
Glucose-Capillary: 297 mg/dL — ABNORMAL HIGH (ref 70–99)

## 2020-02-24 LAB — BASIC METABOLIC PANEL
Anion gap: 11 (ref 5–15)
BUN: 49 mg/dL — ABNORMAL HIGH (ref 8–23)
CO2: 22 mmol/L (ref 22–32)
Calcium: 9 mg/dL (ref 8.9–10.3)
Chloride: 105 mmol/L (ref 98–111)
Creatinine, Ser: 2.59 mg/dL — ABNORMAL HIGH (ref 0.44–1.00)
GFR, Estimated: 18 mL/min — ABNORMAL LOW (ref >60)
Glucose, Bld: 103 mg/dL — ABNORMAL HIGH (ref 70–99)
Potassium: 3.7 mmol/L (ref 3.5–5.1)
Sodium: 138 mmol/L (ref 135–145)

## 2020-02-24 LAB — CBC
HCT: 31.8 % — ABNORMAL LOW (ref 36.0–46.0)
Hemoglobin: 10.2 g/dL — ABNORMAL LOW (ref 12.0–15.0)
MCH: 29 pg (ref 26.0–34.0)
MCHC: 32.1 g/dL (ref 30.0–36.0)
MCV: 90.3 fL (ref 80.0–100.0)
Platelets: 228 10*3/uL (ref 150–400)
RBC: 3.52 MIL/uL — ABNORMAL LOW (ref 3.87–5.11)
RDW: 13 % (ref 11.5–15.5)
WBC: 8.1 10*3/uL (ref 4.0–10.5)
nRBC: 0 % (ref 0.0–0.2)

## 2020-02-24 LAB — GLUCOSE, CAPILLARY
Glucose-Capillary: 98 mg/dL (ref 70–99)
Glucose-Capillary: 114 mg/dL — ABNORMAL HIGH (ref 70–99)
Glucose-Capillary: 148 mg/dL — ABNORMAL HIGH (ref 70–99)
Glucose-Capillary: 160 mg/dL — ABNORMAL HIGH (ref 70–99)

## 2020-02-24 MED ORDER — INSULIN GLARGINE 100 UNIT/ML ~~LOC~~ SOLN
11.0000 [IU] | Freq: Every day | SUBCUTANEOUS | Status: DC
  Administered 2020-02-24 – 2020-02-25 (×2): 11 [IU] via SUBCUTANEOUS
  Filled 2020-02-24 (×3): qty 0.11

## 2020-02-24 NOTE — Progress Notes (Signed)
Physical Therapy Session Note  Patient Details  Name: [name] MRN: [id number] Date of Birth: Apr 24, 1940  Today's Date: 02/24/2020 PT Individual Time: 1000-1031 PT Individual Time Calculation (min): 31 minutes   Short Term Goals: Week 1:  PT Short Term Goal 1 (Week 1): STG = LTG d/t ELOS Week 2:     Skilled Therapeutic Interventions/Progress Updates:    Patient seated upright in w/c upon PT arrival. Patient alert and agreeable to PT session. No complaint of pain throughout session.  Therapeutic Activity: Transfers: Patient performed all functional transfers throughout session with distant supervision. She stated at start of session that she had been sitting for a bit and was not sure how she would feel, but demonstrated ability to consciously take self-assessment prior to initiating ambulation. Provided minimal vc rollator brake application.  Gait Training:  Patient ambulated 135' x1/ 200' x1 using rollator with close and distant supervision. Ambulated with intermittent decrease in L step height, but continues to improve in overall foot clearance and step length. Challenged pt with up/ down, L/ R head turns with pt demosntrating fairly straight path and improvement from prior deviations noted in path with horizontal head turns.   Pt guided in floor transfers x2 with pt requiring vc throughout first performance for technique. Second performance performed with vc only provided during sequence of coming to quadriped prior to bringing hands to elevated surface in order to stand.   Pt education for maintaining close distance to phone in order to have "lifeline" for call to emergency or to family in the case of a fall where she cannot get up. 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 Having a cellphone or a family member nearby makes the problem solving even easier.   Patient  seated in w/c at end of session with brakes locked, belt alarm set, and all needs within reach.  Therapy Documentation Precautions:  Precautions Precautions: Fall,Other (comment) Precaution Comments: LLE weakness, L eye retinopathy, AV fistula L arm, new LOOP recorder placement Restrictions Weight Bearing Restrictions: No   Therapy/Group: Individual Therapy  Alger Simons 02/24/2020, 12:43 PM

## 2020-02-24 NOTE — Progress Notes (Signed)
Physical Therapy Session Note  Patient Details  Name: [name] MRN: [id number] Date of Birth: [birth year]  Today's Date: 02/24/2020 PT Individual Time:Session1: [id number]; Arthor [id number] PT Individual Time Calculation (min): 73 min & 45 min  Short Term Goals: Week 1:  PT Short Term Goal 1 (Week 1): STG = LTG d/t ELOS  Skilled Therapeutic Interventions/Progress Updates:    Session1:  Patient in recliner and reports worked with PT earlier to recover from a fall.  Also reports may discharge home prior to Friday.  Sit to stand with S to rollator after PT placed and locked brakes.  Patient ambulated with S and noted decreased foot clearance on L with ambulation, but no episodes of tripping or catching foot. Ambulated to therapy gym x 120'.  In parallel bars stepping over cones forward for foot clearance, stride length, then side stepping over cones for hip strength balance and placement all with bilateral UE support.  Patient ambulated through floor ladder with HHA x 4 rounds step over step again for foot clearance and stride length.  Ambulated with rollator x 200' with S and cues for continuing to pretend to step over items for foot clearance/stride length.  In parallel bars forward lunges onto BOSU for LE strength and stride length alternating feet. Ambulated without device with facilitation for arm swing and cues for foot clearance x 130'.  Patient negotiated 1 step with rollator after demonstration and with mod cues for technique with locking brakes, etc.  Practiced x 3 with CGA and progressing to min cues.  Patient ambulated to room with rollator and S.  Performed therex as noted below with S and UE support on rollator vs counter at sink. Then supine in bed.  Patient transferred back to w/c along with supine<>sit with S.  Left with alarm on in w/c and call bell/needs in reach.  Sesison2:  Patient in w/c in room.  Seated to don shoes with min A.  Patient ambulated with rollator to ortho gym x  61' with S.  Performed car transfer to simulated sedan height with S and cues for hand placement.  Negotiated ramp with rollator x 2 with cues for applying brakes on descent with close S.  Patient performed balance activity on level tile on BITS reaching to tap letters in sequence "A-Z" >3 sec reaction time and 1:36 to complete.  Patient performed x 2 standing on Airex balance mat with close S to CGA and cues to use L UE as well.  Patient on Nu Step x 6 minutes UE/LE at level 3.  Assisted with rollator and S to room x 80'.  Left seated in w/c with alarm activated and needs in reach.  Therapy Documentation Precautions:  Precautions Precautions: Fall,Other (comment) Precaution Comments: LLE weakness, L eye retinopathy, AV fistula L arm, new LOOP recorder placement Restrictions Weight Bearing Restrictions: No Pain: Pain Assessment Pain Scale: 0-10 Pain Score: 4  Pain Type: Acute pain Pain Location: Hip Pain Orientation: Left Pain Descriptors / Indicators: Aching Exercises: General Exercises - Lower Extremity Long Arc Quad: 10 reps;Strengthening;Supine Hip ABduction/ADduction: Strengthening;Both;Standing;10 reps Straight Leg Raises: Strengthening;Both;10 reps;Supine Hip Flexion/Marching: Strengthening;Both;Standing;10 reps Heel Raises: Both;10 reps;Standing Total Joint Exercises Bridges: Strengthening;10 reps;Supine;Both Other Exercises Other Exercises: standing hamstring curls x 10 Other Exercises: standing wall bumps x 10 with cues and min A no UE support Other Exercises: sidelying clamshell hip abduction x 10  Therapy/Group: Individual Therapy  Reginia Naas  Carson, PT 02/24/2020, 12:12 PM

## 2020-02-24 NOTE — Progress Notes (Signed)
Patient ID: [name], female   DOB: 1940-02-16, 80 y.o.   MRN: [id number]   Patient DME ordered through [facility].   [place], [place]

## 2020-02-24 NOTE — Progress Notes (Signed)
Occupational Therapy Session Note  Patient Details  Name: [name] MRN: [id number] Date of Birth: 1940-09-08  Today's Date: 02/24/2020 OT Individual Time: [phone] OT Individual Time Calculation (min): 58 min    Short Term Goals: Week 1:  OT Short Term Goal 1 (Week 1): STG = LTG 2/2 ELOS  Skilled Therapeutic Interventions/Progress Updates:    Patient in bed, alert and aware of needs.  She denies pain and requests a shower to start session this am.  Supine to sitting edge of bed with CS.  Sit to stand and ambulation with RW to/from bed, toilet, shower bench with CS.  She is able to gather items needed for shower with CS and min cues for safe technique with reaching and walker placement.  toileting completed with CS.  Shower completed with CS seated shower bench and hand held shower.  Dressing completed seated in arm chair with CS.  Oral care in stance with CS.  She ambulated to and from therapy gym with CS using RW.  Completed unsupported sitting trunk and core stability activities, bilateral UE coordination activities with good tolerance.   Returned to w/c at close of session.  Call bell and tray table in reach.    Therapy Documentation Precautions:  Precautions Precautions: Fall,Other (comment) Precaution Comments: LLE weakness, L eye retinopathy, AV fistula L arm, new LOOP recorder placement Restrictions Weight Bearing Restrictions: No   Therapy/Group: Individual Therapy  Carlos Levering 02/24/2020, 7:34 AM

## 2020-02-24 NOTE — Progress Notes (Signed)
[name]   Subjective/Complaints: No complaints this morning Hgb 10.2 Cr 2.59 Other labs stable  ROS; Denies CP, SOB, N/V/D  Objective:   No results found. Recent Labs    02/24/20 0529  WBC 8.1  HGB 10.2*  HCT 31.8*  PLT 228   Recent Labs    02/24/20 0529  NA 138  K 3.7  CL 105  CO2 22  GLUCOSE 103*  BUN 49*  CREATININE 2.59*  CALCIUM 9.0    Intake/Output Summary (Last 24 hours) at 02/24/2020 1345 Last data filed at 02/24/2020 0835 Gross per 24 hour  Intake 240 ml  Output 0 ml  Net 240 ml        Physical Exam: Vital Signs Blood pressure (!) 144/53, pulse (!) 53, temperature 97.7 F (36.5 C), temperature source Oral, resp. rate 17, height '5\' 3"'$  (1.6 m), weight 71.4 kg, SpO2 100 %. Gen: no distress, normal appearing HEENT: oral mucosa pink and moist, NCAT Chest: normal effort, normal rate of breathing +Murmur. Bradycardic Respiratory: Normal effort.  No stridor.  Bilateral clear to auscultation. GI: Non-distended.  BS +. Skin: Warm and dry.  Intact. Psych: Normal mood.  Normal behavior. Musc: No edema in extremities.  No tenderness in extremities. Neurologic: Alert Motor: 5/5 RUE/RLE 3/5 left deltoid, bicep, tricep, grip, 5/5 Right and 2- Left hip flexor, knee extensors, 5/5 R and 0/5 L ankle dorsiflexor and plantar flexor  Assessment/Plan: 1. Functional deficits which require 3+ hours per day of interdisciplinary therapy in a comprehensive inpatient rehab setting.  Physiatrist is providing close team supervision and 24 hour management of active medical problems listed below.  Physiatrist and rehab team continue to assess barriers to discharge/monitor patient progress toward functional and medical goals  Care Tool:  Bathing    Body parts bathed by patient: Right arm,Face,Left arm,Chest,Front perineal area,Abdomen,Buttocks,Right upper leg,Left upper leg,Right lower leg,Left lower leg          Bathing assist Assist Level: Supervision/Verbal cueing     Upper Body Dressing/Undressing Upper body dressing   What is the patient wearing?: Pull over shirt    Upper body assist Assist Level: Supervision/Verbal cueing    Lower Body Dressing/Undressing Lower body dressing      What is the patient wearing?: Underwear/pull up,Pants     Lower body assist Assist for lower body dressing: Supervision/Verbal cueing     Toileting Toileting    Toileting assist Assist for toileting: Supervision/Verbal cueing     Transfers Chair/bed transfer  Transfers assist     Chair/bed transfer assist level: Supervision/Verbal cueing     Locomotion Ambulation   Ambulation assist      Assist level: Supervision/Verbal cueing Assistive device: Walker-rolling Max distance: 175 ft   Walk 10 feet activity   Assist     Assist level: Supervision/Verbal cueing Assistive device: No Device   Walk 50 feet activity   Assist    Assist level: Supervision/Verbal cueing Assistive device: No Device    Walk 150 feet activity   Assist Walk 150 feet activity did not occur: Safety/medical concerns  Assist level: Supervision/Verbal cueing Assistive device: No Device    Walk 10 feet on uneven surface  activity   Assist Walk 10 feet on uneven surfaces activity did not occur: Safety/medical concerns         Wheelchair     Assist Will patient use wheelchair at discharge?: No  Wheelchair 50 feet with 2 turns activity    Assist            Wheelchair 150 feet activity     Assist          Blood pressure (!) 144/53, pulse (!) 53, temperature 97.7 F (36.5 C), temperature source Oral, resp. rate 17, height '5\' 3"'$  (1.6 m), weight 71.4 kg, SpO2 100 %.     Medical Problem List and Plan: 1.  Impaired mobility and ADLs secondary to right ACA infarcts  Continue CIR 2.  Antithrombotics: -DVT/anticoagulation:  Mechanical: Sequential  compression devices, below knee Bilateral lower extremities             -antiplatelet therapy: Transient thrombocytopenia has resolved. ASA to start? 3. Pain Management: tylenol prn for back/right hip pain.  4. Mood: LCSW to follow for evaluation and support.              -antipsychotic agents: N/A 5. Neuropsych: This patient is capable of making decisions on her own behalf. 6. Skin/Wound Care: Routine pressure relief measures  7. Fluids/Electrolytes/Nutrition: Monitor I/Os.  8. CAD/TVAR: Followed by Dr. Virgina Jock. Plavix was d/c due to bleed. Continue Imdur, Crestor and Demadex. 9. HTN: Monitor BP tid--continue Amlodipine, Demadex, Hydralazine and Imdur.  Vitals:   02/24/20 0428 02/24/20 1319  BP: (!) 128/51 (!) 144/53  Pulse: 62 (!) 53  Resp:  17  Temp:  97.7 F (36.5 C)  SpO2: 99% 100%   +Orthostasis on 2/5  2/7: diastolic low and systolic high- continue to monitor.  9. T2DM with neuropathy/retionopathy: Hgb A1c- 7.7.CBGs ranging 70-160.               Continue Lantus 10 units with SSI for elevated BS. Will monitor BS ac/hs  CBG (last 3)  Recent Labs    02/23/20 2052 02/24/20 0632 02/24/20 1132  GLUCAP 297* 98 114*   2/7: CBG 98-297: increase Lantus to 11U.  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 2.67 on 2/4, reviewed 2/7 and improved to 2.59  Encourage fluids 11. Hypokalemia  K+ 3.7 on 2/4  Cont supplement  LOS: 7 days A FACE TO FACE EVALUATION WAS PERFORMED  Jisel Fleet P Lusero Nordlund 02/24/2020, 1:45 PM

## 2020-02-25 LAB — GLUCOSE, CAPILLARY
Glucose-Capillary: 93 mg/dL (ref 70–99)
Glucose-Capillary: 169 mg/dL — ABNORMAL HIGH (ref 70–99)
Glucose-Capillary: 194 mg/dL — ABNORMAL HIGH (ref 70–99)
Glucose-Capillary: 153 mg/dL — ABNORMAL HIGH (ref 70–99)

## 2020-02-25 NOTE — Progress Notes (Signed)
Occupational Therapy Discharge Summary  Patient Details  Name: RAYLI WIEDERHOLD MRN: [id number] Date of Birth: [birth year]  Today's Date: 02/25/2020 OT Individual Time: 1300-1345 OT Individual Time Calculation (min): 45 min    Patient has met 12 of 12 long term goals due to improved activity tolerance, improved balance, postural control, ability to compensate for deficits and functional use of  LEFT upper extremity.  Patient to discharge at overall Modified Independent level.  Patient's care partner unavailable to provide the necessary physical assistance at discharge.    Reasons goals not met: NA  Recommendation:  Patient will benefit from ongoing skilled OT services in home health setting to continue to advance functional skills in the area of BADL and iADL.  Equipment: 3in1  Reasons for discharge: treatment goals met and discharge from hospital  Patient/family agrees with progress made and goals achieved: Yes  OT Discharge Precautions/Restrictions  Precautions Precautions: Fall;Other (comment) Precaution Comments: LLE weakness, L eye retinopathy, AV fistula L arm, new LOOP recorder placement Restrictions Weight Bearing Restrictions: No Pain Pain Assessment Pain Scale: 0-10 Pain Score: 0-No pain ADL ADL Equipment Provided: Long-handled sponge Eating: Modified independent Where Assessed-Eating: Wheelchair Grooming: Modified independent Where Assessed-Grooming: Wheelchair Upper Body Bathing: Modified independent Where Assessed-Upper Body Bathing: Shower Lower Body Bathing: Supervision/safety Where Assessed-Lower Body Bathing: Shower Upper Body Dressing: Modified independent (Device) Where Assessed-Upper Body Dressing: Wheelchair Lower Body Dressing: Modified independent Where Assessed-Lower Body Dressing: Wheelchair Toileting: Modified independent Where Assessed-Toileting: Glass blower/designer: Distant supervision Armed forces technical officer Method: Information systems manager: Bedside commode,Grab bars Tub/Shower Transfer Method: Psychologist, educational: Distant supervision Social research officer, government Method: Heritage manager: Civil engineer, contracting with back Vision Baseline Vision/History: Wears glasses Wears Glasses: Reading only Patient Visual Report: Blurring of vision (L eye) Vision Assessment?: Yes Eye Alignment: Within Functional Limits Ocular Range of Motion: Within Functional Limits Alignment/Gaze Preference: Within Defined Limits Tracking/Visual Pursuits: Able to track stimulus in all quads without difficulty Saccades: Within functional limits Convergence: Within functional limits Visual Fields: Left visual field deficit (baseline) Perception  Perception: Within Functional Limits Praxis Praxis: Intact Cognition Overall Cognitive Status: Within Functional Limits for tasks assessed Orientation Level: Oriented X4 Attention: Focused;Sustained;Selective Focused Attention: Appears intact Sustained Attention: Appears intact Selective Attention: Appears intact Memory: Appears intact Awareness: Appears intact Problem Solving: Appears intact Safety/Judgment: Appears intact Sensation Sensation Light Touch: Appears Intact Hot/Cold: Appears Intact Proprioception: Appears Intact Stereognosis: Appears Intact Coordination Gross Motor Movements are Fluid and Coordinated: Yes (WFL for ADL) Fine Motor Movements are Fluid and Coordinated: Yes (WFL for ADL) Coordination and Movement Description: req increased time to initiate func transfers compared to baseline, but greatly improved from eval 9 Hole Peg Test: 54 sec with L hand Motor  Motor Motor: Hemiplegia Motor - Discharge Observations: mild L hemiparesis Mobility  Bed Mobility Bed Mobility: Supine to Sit;Rolling Right;Right Sidelying to Sit;Sit to Supine;Rolling Left Rolling Right: Independent Rolling Left: Independent Right Sidelying to Sit: Independent with  assistive device Supine to Sit: Independent with assistive device Sit to Supine: Independent with assistive device Transfers Sit to Stand: Independent with assistive device Stand to Sit: Independent with assistive device  Trunk/Postural Assessment  Cervical Assessment Cervical Assessment: Within Functional Limits Thoracic Assessment Thoracic Assessment: Exceptions to [facility] (rounded shoulders) Lumbar Assessment Lumbar Assessment: Exceptions to [facility] (posterior pelvic tilt) Postural Control Postural Control: Within Functional Limits (WFL with AD)  Balance Balance Balance Assessed: Yes Berg Balance Test Standing Ubsupported with Feet Together: Able to place feet together independently  and stand 1 minute safely Static Sitting Balance Static Sitting - Balance Support: Feet supported Static Sitting - Level of Assistance: 7: Independent Dynamic Sitting Balance Dynamic Sitting - Balance Support: Bilateral upper extremity supported;During functional activity Dynamic Sitting - Level of Assistance: 5: Stand by assistance (supervision) Static Standing Balance Static Standing - Balance Support: During functional activity;Bilateral upper extremity supported;No upper extremity supported Static Standing - Level of Assistance: 6: Modified independent (Device/Increase time) Dynamic Standing Balance Dynamic Standing - Balance Support: Bilateral upper extremity supported;During functional activity Dynamic Standing - Level of Assistance: 5: Stand by assistance Extremity/Trunk Assessment RUE Assessment RUE Assessment: Within Functional Limits General Strength Comments: 5/5 shoulder flexion, grip strength LUE Assessment LUE Assessment: Exceptions to [facility] Active Range of Motion (AROM) Comments: 3/4 to full shoulder flexion, elbow/digit extension/flexion General Strength Comments: 4/5 in shoulder flexion, 34+ grip strength LUE Body System: Neuro Brunstrum levels for arm and hand: Arm;Hand Brunstrum level  for arm: Stage V Relative Independence from Synergy Brunstrum level for hand: Stage V Independence from basic synergies  Session Note: Pt received semi-reclined in bed with LPN present, agreeable to therapy. DC reassessments completed as documented above. Pt Completed bed mobility with mod I, STS with mod I and amb to toilet to complete toilet/shower transfer with rollator + close S. Cont void of bladder, mod I for seated peri care. In shower, bathed UB with mod I, LB with distant S with use of LH sponge. Dressed and donned B shoes seated in w/c with mod I. Stood to groom at sink with distant S. Readministrated 9HPT and hand dynameter with results documented above. Pt left in w/c with safety belt alarm engaged, call bell in reach, and all immediate needs met.    Volanda Napoleon MS, OTR/L  02/25/2020, 4:18 PM

## 2020-02-25 NOTE — Progress Notes (Signed)
Pt educated on eating after stroke. Handouts given and materials explained. Educated pt on [place] and food alternatives. Pt has no further questions/concerns. Sheela Stack, LPN

## 2020-02-25 NOTE — Plan of Care (Signed)

## 2020-02-25 NOTE — Progress Notes (Signed)
[place] PHYSICAL MEDICINE & REHABILITATION PROGRESS NOTE   Subjective/Complaints: No complaints this morning.   ROS; Denies CP, SOB, N/V/D  Objective:   No results found. Recent Labs    02/24/20 0529  WBC 8.1  HGB 10.2*  HCT 31.8*  PLT 228   Recent Labs    02/24/20 0529  NA 138  K 3.7  CL 105  CO2 22  GLUCOSE 103*  BUN 49*  CREATININE 2.59*  CALCIUM 9.0    Intake/Output Summary (Last 24 hours) at 02/25/2020 1009 Last data filed at 02/25/2020 0735 Gross per 24 hour  Intake 880 ml  Output -  Net 880 ml        Physical Exam: Vital Signs Blood pressure (!) 164/51, pulse 60, temperature 98 F (36.7 C), temperature source Oral, resp. rate 18, height '5\' 3"'$  (1.6 m), weight 69.6 kg, SpO2 99 %. Gen: no distress, normal appearing HEENT: oral mucosa pink and moist, NCAT Cardio:  +Murmur. Bradycardic Respiratory: Normal effort.  No stridor.  Bilateral clear to auscultation. GI: Non-distended.  BS +. Skin: Warm and dry.  Intact. Psych: Normal mood.  Normal behavior. Musc: No edema in extremities.  No tenderness in extremities. Neurologic: Alert Motor: 5/5 RUE/RLE 3/5 left deltoid, bicep, tricep, grip, 5/5 Right and 2- Left hip flexor, knee extensors, 5/5 R and 0/5 L ankle dorsiflexor and plantar flexor  Assessment/Plan: 1. Functional deficits which require 3+ hours per day of interdisciplinary therapy in a comprehensive inpatient rehab setting.  Physiatrist is providing close team supervision and 24 hour management of active medical problems listed below.  Physiatrist and rehab team continue to assess barriers to discharge/monitor patient progress toward functional and medical goals  Care Tool:  Bathing    Body parts bathed by patient: Right arm,Face,Left arm,Chest,Front perineal area,Abdomen,Buttocks,Right upper leg,Left upper leg,Right lower leg,Left lower leg         Bathing assist Assist Level: Supervision/Verbal cueing     Upper Body  Dressing/Undressing Upper body dressing   What is the patient wearing?: Pull over shirt    Upper body assist Assist Level: Supervision/Verbal cueing    Lower Body Dressing/Undressing Lower body dressing      What is the patient wearing?: Underwear/pull up,Pants     Lower body assist Assist for lower body dressing: Supervision/Verbal cueing     Toileting Toileting    Toileting assist Assist for toileting: Supervision/Verbal cueing     Transfers Chair/bed transfer  Transfers assist     Chair/bed transfer assist level: Supervision/Verbal cueing     Locomotion Ambulation   Ambulation assist      Assist level: Supervision/Verbal cueing Assistive device: Rollator Max distance: 175 ft   Walk 10 feet activity   Assist     Assist level: Set up assist Assistive device: Rollator   Walk 50 feet activity   Assist    Assist level: Supervision/Verbal cueing Assistive device: Rollator    Walk 150 feet activity   Assist Walk 150 feet activity did not occur: Safety/medical concerns  Assist level: Supervision/Verbal cueing Assistive device: Rollator    Walk 10 feet on uneven surface  activity   Assist Walk 10 feet on uneven surfaces activity did not occur: Safety/medical concerns   Assist level: Supervision/Verbal cueing Assistive device: Rollator   Wheelchair     Assist Will patient use wheelchair at discharge?: No             Wheelchair 50 feet with 2 turns activity    Assist  Wheelchair 150 feet activity     Assist          Blood pressure (!) 164/51, pulse 60, temperature 98 F (36.7 C), temperature source Oral, resp. rate 18, height '5\' 3"'$  (1.6 m), weight 69.6 kg, SpO2 99 %.     Medical Problem List and Plan: 1.  Impaired mobility and ADLs secondary to right ACA infarcts  Conitnue CIR 2.  Antithrombotics: -DVT/anticoagulation:  Mechanical: Sequential compression devices, below knee Bilateral lower  extremities             -antiplatelet therapy: Transient thrombocytopenia has resolved. Aspirin '81mg'$  daily has been restarted.  3. Pain Management: tylenol prn for back/right hip pain.  4. Mood: LCSW to follow for evaluation and support.              -antipsychotic agents: N/A 5. Neuropsych: This patient is capable of making decisions on her own behalf. 6. Skin/Wound Care: Routine pressure relief measures  7. Fluids/Electrolytes/Nutrition: Monitor I/Os.  8. CAD/TVAR: Followed by Dr. Virgina Jock. Plavix was d/c due to bleed. Continue Imdur, Crestor and Demadex. 9. HTN: Monitor BP tid Vitals:   02/25/20 0648 02/25/20 0857  BP: (!) 164/51   Pulse:  60  Resp:    Temp:    SpO2:     +Orthostasis on 2/5  2/8: systolic is high and diastolic is low- continue amlodipine, demadex, hydralazine, and imdur.  9. T2DM with neuropathy/retionopathy: Hgb A1c- 7.7.CBGs ranging 70-160.               Continue Lantus 10 units with SSI for elevated BS. Will monitor BS ac/hs  CBG (last 3)  Recent Labs    02/24/20 1639 02/24/20 2129 02/25/20 0618  GLUCAP 148* 160* 93   2/7: CBG 98-297: increase Lantus to 11U.   2/8: CBGs 93-160: continue to monitor.  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 2.67 on 2/4, reviewed 2/7 and improved to 2.59  Encourage fluids 11. Hypokalemia  K+ 3.7 on 2/4  Cont supplement  Repeat BMP tomorrow.   LOS: 8 days A FACE TO FACE EVALUATION WAS PERFORMED  Yochanan Eddleman P Rilyn Scroggs 02/25/2020, 10:09 AM

## 2020-02-25 NOTE — Progress Notes (Signed)
Physical Therapy Discharge Summary  Patient Details  Name: [name] MRN: [id number] Date of Birth: 1941/01/10  Today's Date: 02/25/2020 PT Individual Time: 1130-1200; 1415-1500 PT Individual Time Calculation (min): 36mn and 45 min    Patient has met 8 of 8 long term goals due to improved activity tolerance, improved balance, improved postural control, increased strength, decreased pain, functional use of  left upper extremity and left lower extremity and improved coordination.  Patient to discharge at an ambulatory level Supervision.   Patient's care partner is independent to provide the necessary physical assistance at discharge.  Reasons goals not met: n/a  Recommendation:  Patient will benefit from ongoing skilled PT services in home health setting followed by outpatient setting to continue to advance safe functional mobility, address ongoing impairments in standing balance, quality of gait, minor impairments in coordination/ motor planning/ praxis, and minimize fall risk.  Equipment: rollator, 3-in-1  Reasons for discharge: treatment goals met and discharge from hospital  Patient/family agrees with progress made and goals achieved: Yes  PT Discharge Precautions/Restrictions Precautions Precautions: Fall;Other (comment) Precaution Comments: LLE weakness, L eye retinopathy, AV fistula L arm, new LOOP recorder placement Restrictions Weight Bearing Restrictions: No Pain Pain Assessment Pain Scale: 0-10 Pain Score: 0-No pain Vision/Perception  Vision - Assessment Eye Alignment: Within Functional Limits Ocular Range of Motion: Within Functional Limits Alignment/Gaze Preference: Within Defined Limits Tracking/Visual Pursuits: Able to track stimulus in all quads without difficulty Perception Perception: Within Functional Limits Praxis Praxis: Intact  Cognition Overall Cognitive Status: Within Functional Limits for tasks assessed Orientation Level: Oriented  X4 Attention:  Appears intact Memory: Appears intact Awareness: Appears intact Problem Solving: Appears intact Safety/Judgment: Appears intact Sensation Sensation Light Touch: Appears Intact Coordination Gross Motor Movements are Fluid and Coordinated: Yes (WFL for functional mobility) Fine Motor Movements are Fluid and Coordinated: Yes (WFL for functional mobility) Coordination and Movement Description: req increased time to initiate functional transfers compared to baseline, but greatly improved from eval Motor  Motor Motor: Hemipareisis Motor - Discharge Observations: mild L hemiparesis  Mobility Bed Mobility Bed Mobility: Supine to Sit;Rolling Right;Right Sidelying to Sit;Sit to Supine;Rolling Left Rolling Right: Independent Rolling Left: Independent Right Sidelying to Sit: Independent with assistive device Supine to Sit: Independent with assistive device Sit to Supine: Independent with assistive device Transfers Transfers: Sit to Stand;Stand to Sit;Stand Pivot Transfers Sit to Stand: Independent with assistive device Stand to Sit: Independent with assistive device Stand Pivot Transfers: Independent with assistive device Transfer (Assistive device): Rollator Locomotion     Trunk/Postural Assessment  Cervical Assessment Cervical Assessment: Exceptions to [facility](forwrad head) Thoracic Assessment Thoracic Assessment: Exceptions to [facility] shoulders) Lumbar Assessment Lumbar Assessment: Within Functional Limits Postural Control Postural Control: Within Functional Limits  Balance Balance Balance Assessed: Yes Standardized Balance Assessment Standardized Balance Assessment: Timed Up and Go Test Timed Up and Go Test TUG: Normal TUG Normal TUG (seconds): 24.75sec Static Sitting Balance Static Sitting - Balance Support: Feet supported Static Sitting - Level of Assistance: 7: Independent Dynamic Sitting Balance Dynamic Sitting - Balance Support: Feet supported;  During functional activity Dynamic Sitting - Level of Assistance: 5: Stand by assistance (supervision) Static Standing Balance Static Standing - Balance Support: During functional activity;Bilateral upper extremity supported;No upper extremity supported Static Standing - Level of Assistance: 6: Modified independent (Device/Increase time) Dynamic Standing Balance Dynamic Standing - Balance Support: Bilateral upper extremity supported;L/R upper extremity supported; During functional activity Dynamic Standing - Level of Assistance: 5: Stand by assistance Extremity Assessment  RLE Assessment RLE Assessment: Exceptions to [facility] RLE Strength RLE Overall Strength: Within Functional Limits for tasks assessed Right Hip Flexion: 4+/5 Right Hip ABduction: 4+/5 Right Hip ADduction: 4+/5 Right Knee Flexion: 4/5 Right Knee Extension: 4+/5 Right Ankle Dorsiflexion: 4+/5 Right Ankle Plantar Flexion: 4+/5 LLE Assessment LLE Assessment: Exceptions to WFL LLE Strength LLE Overall Strength: Deficits Left Hip Flexion: 4/5 Left Hip ABduction: 4-/5 Left Hip ADduction: 4-/5 Left Knee Flexion: 4-/5 Left Knee Extension: 4/5 Left Ankle Dorsiflexion: 4-/5 Left Ankle Plantar Flexion: 4-/5   Skilled Therapeutic Interventions/Progress Updates:  Session 1: Patient seated upright in w/c upon PT arrival. Patient alert and agreeable to PT session. Patient denied pain during session.  Therapeutic Activity: Bed Mobility: Pt performed bed rolling to each side with no use of bed rail but UE to EOB to complete with Mod I. Supine <> sit requires EOB support and Mod I to complete with extra time and mild/ moderate effort.  Transfers: Patient performed STS transfers with Mod I. SPVt transfers with distant sup, and car transfer with supervision. No vc required this session. Pt cleared for Mod I mobility in room with use of rollator. Pt education to continue self monitoring and to call for supervision for mobility if  she feels fatigued or "off" in any way.  Gait Training:  Patient ambulated long distances >300 feet x2 and multiple bouts >150 feet using new personal rollator with close and distant supervision. Continues to demonstrate NBOS and decreased step height bilaterally but does have functional foot clearance for controlled surfaces and environments. Provided verbal cue for ensuring foot clearance.  Session 2: Patient seated upright in w/c upon PT arrival. Patient alert and agreeable to PT session. Patient denied pain during session. Pt reminded of stretches for back if pt continues to experience back pain again in future. Pt is able to return demonstrate.   Therapeutic Activity: Pt is concerned/ curious as to whether she will be able to reach her own BR/ BA when she returns home to her own house in a few weeks after discharging to her brother's home. Pt guided in ascending/ descending one flight of stairs in hospital. Using BHR and step-to pattern, she is able to use slow, but steady pace complete entire flight up/ down with supervision and minimal cueing for technique.  Pt states her brother is active and walks his cul-de-sac daily, as well as goes to the gym and participates in a weekly bowling league. Pt encouraged to work with brother and HHPT to ambulate cul-de-sac for continued strengthening and improvement in activity tolerance. Pt is encouraged and open to idea.   NMR: NMR facilitated through standing balance activities including performance of TUG, Berg Balance and 5x STS functional outcome measures. Pt significantly improves performance of Berg from 33/ 56 to 50/ 56 increasing to moderate fall risk (borderline to low fall risk). TUG improves significantly from 12mn 52 sec to 24.75 seconds using no AD.   Patient seated upright in w/c at end of each session with brakes locked, reminder to call if pt feels as though she may need any supervision for in-room mobility, and all needs within  reach.  JAlger Simons2/08/2020, 7:23 PM

## 2020-02-25 NOTE — Progress Notes (Signed)
Occupational Therapy Session Note  Patient Details  Name: AAMORI MCMASTERS MRN: [id number] Date of Birth: July 04, 1940  Today's Date: 02/25/2020 OT Individual Time: 1300-1345 OT Individual Time Calculation (min): 45 min    Short Term Goals: Week 1:  OT Short Term Goal 1 (Week 1): STG = LTG 2/2 ELOS  Skilled Therapeutic Interventions/Progress Updates:    Pt received sitting in the recliner with no c/o pain, agreeable to OT session. Pt completed 125 ft of functional mobility with the rollator with supervision. She completed standing level functional reaching activity using the BITS. Pt with LUE 85% accuracy and 4 sec reaction time. Pt then completed tracing activity to work on [place]. Only min facilitation required and great accuracy. Pt then completed dynamic balance activity with reciprocal stepping component without UE support with CGA. Pt completed blocked practice of stepping up/down onto a 5 in step with CGA. Min cueing for leading with the R LE. Pt with no c/o pain throughout session, rest breaks provided between sets. Pt returned to her room and was left sitting up with all needs met.   Therapy Documentation Precautions:  Precautions Precautions: Fall,Other (comment) Precaution Comments: LLE weakness, L eye retinopathy, AV fistula L arm, new LOOP recorder placement Restrictions Weight Bearing Restrictions: No   Therapy/Group: Individual Therapy  Curtis Sites 02/25/2020, 6:20 AM

## 2020-02-25 NOTE — Progress Notes (Signed)
Patient ID: [name], female   DOB: 05/27/1940, 80 y.o.   MRN: [id number]   [facility] options presented to patient on 02/24/2020.  [place], [employer] [place]

## 2020-02-25 NOTE — Progress Notes (Signed)
Patient ID: [name], female   DOB: 1940-02-25, 80 y.o.   MRN: [id number]   Patient referral sent and accepted by [facility].  [place], [place]

## 2020-02-26 LAB — GLUCOSE, CAPILLARY
Glucose-Capillary: 105 mg/dL — ABNORMAL HIGH (ref 70–99)
Glucose-Capillary: 132 mg/dL — ABNORMAL HIGH (ref 70–99)

## 2020-02-26 LAB — BASIC METABOLIC PANEL
Anion gap: 10 (ref 5–15)
BUN: 52 mg/dL — ABNORMAL HIGH (ref 8–23)
CO2: 23 mmol/L (ref 22–32)
Calcium: 8.9 mg/dL (ref 8.9–10.3)
Chloride: 106 mmol/L (ref 98–111)
Creatinine, Ser: 2.75 mg/dL — ABNORMAL HIGH (ref 0.44–1.00)
GFR, Estimated: 17 mL/min — ABNORMAL LOW (ref >60)
Glucose, Bld: 109 mg/dL — ABNORMAL HIGH (ref 70–99)
Potassium: 4 mmol/L (ref 3.5–5.1)
Sodium: 139 mmol/L (ref 135–145)

## 2020-02-26 MED ORDER — HYDRALAZINE HCL 50 MG PO TABS
50.0000 mg | ORAL_TABLET | Freq: Three times a day (TID) | ORAL | 0 refills | Status: DC
Start: 2020-02-26 — End: 2021-09-14

## 2020-02-26 MED ORDER — ROSUVASTATIN CALCIUM 40 MG PO TABS: 40.0000 mg | ORAL_TABLET | Freq: Every day | ORAL | 0 refills | Status: DC

## 2020-02-26 MED ORDER — INSULIN GLARGINE 100 UNIT/ML ~~LOC~~ SOLN
12.0000 [IU] | Freq: Every day | SUBCUTANEOUS | Status: DC
  Filled 2020-02-26: qty 0.12

## 2020-02-26 MED ORDER — ISOSORBIDE MONONITRATE ER 30 MG PO TB24: 30.0000 mg | ORAL_TABLET | Freq: Every day | ORAL | 0 refills | Status: DC

## 2020-02-26 MED ORDER — POTASSIUM CHLORIDE CRYS ER 10 MEQ PO TBCR: 10.0000 meq | EXTENDED_RELEASE_TABLET | Freq: Every day | ORAL | 0 refills | Status: DC

## 2020-02-26 MED ORDER — VENLAFAXINE HCL ER 150 MG PO CP24: 150.0000 mg | ORAL_CAPSULE | Freq: Every day | ORAL | 0 refills | Status: AC

## 2020-02-26 MED ORDER — LABETALOL HCL 200 MG PO TABS: 200.0000 mg | ORAL_TABLET | Freq: Two times a day (BID) | ORAL | 0 refills | Status: DC

## 2020-02-26 MED ORDER — SENNOSIDES-DOCUSATE SODIUM 8.6-50 MG PO TABS: 2.0000 | ORAL_TABLET | Freq: Two times a day (BID) | ORAL | 0 refills | Status: DC

## 2020-02-26 MED ORDER — TORSEMIDE 20 MG PO TABS: 40.0000 mg | ORAL_TABLET | Freq: Every day | ORAL | 0 refills | Status: DC

## 2020-02-26 MED ORDER — POLYETHYLENE GLYCOL 3350 17 G PO PACK: 17.0000 g | PACK | Freq: Every day | ORAL | 0 refills | Status: DC

## 2020-02-26 MED ORDER — INSULIN GLARGINE 100 UNIT/ML ~~LOC~~ SOLN: 12.0000 [IU] | Freq: Every day | SUBCUTANEOUS | 11 refills | Status: DC

## 2020-02-26 NOTE — Discharge Instructions (Signed)
Inpatient Rehab Discharge Instructions  Choyce Haverty Snedden Discharge date and time:  02/26/20  Activities/Precautions/ Functional Status: Activity: no lifting, driving, or strenuous exercise for till cleared by MD Diet: cardiac diet Wound Care: none needed    Functional status:  ___ No restrictions     ___ Walk up steps independently _X__ 24/7 supervision/assistance   ___ Walk up steps with assistance ___ Intermittent supervision/assistance  _X__ Bathe/dress independently ___ Walk with walker     ___ Bathe/dress with assistance ___ Walk Independently    ___ Shower independently _X__ Walk with supervision     ___ Shower with assistance __X_ No alcohol     ___ Return to work/school ________  COMMUNITY REFERRALS UPON DISCHARGE:    Home Health:   PT    OT                     Agency: [facility] Phone: [phone]    Medical Equipment/Items Ordered:                                                 Agency/Supplier:   Special Instructions:   STROKE/TIA DISCHARGE INSTRUCTIONS SMOKING Cigarette smoking nearly doubles your risk of having a stroke & is the single most alterable risk factor  If you smoke or have smoked in the last 12 months, you are advised to quit smoking for your health.  Most of the excess cardiovascular risk related to smoking disappears within a year of stopping.  Ask you doctor about anti-smoking medications  [place] Quit Line: 1-800-QUIT NOW  Free Smoking Cessation Classes [phone]  CHOLESTEROL Know your levels; limit fat & cholesterol in your diet  Lipid Panel     Component Value Date/Time   CHOL 296 (H) 02/11/2020 1625   TRIG 148 02/11/2020 1625   HDL 67 02/11/2020 1625   CHOLHDL 4.4 02/11/2020 1625   VLDL 30 02/11/2020 1625   LDLCALC 199 (H) 02/11/2020 1625      Many patients benefit from treatment even if their cholesterol is at goal.  Goal: Total Cholesterol (CHOL) less than 160  Goal:  Triglycerides (TRIG) less than 150  Goal:  HDL  greater than 40  Goal:  LDL (LDLCALC) less than 100   BLOOD PRESSURE American Stroke Association blood pressure target is less that 120/80 mm/Hg  Your discharge blood pressure is:  BP: (!) 139/52  Monitor your blood pressure  Limit your salt and alcohol intake  Many individuals will require more than one medication for high blood pressure  DIABETES (A1c is a blood sugar average for last 3 months) Goal HGBA1c is under 7% (HBGA1c is blood sugar average for last 3 months)  Diabetes:     Lab Results  Component Value Date   HGBA1C 7.7 (H) 02/11/2020     Your HGBA1c can be lowered with medications, healthy diet, and exercise.  Check your blood sugar as directed by your physician  Call your physician if you experience unexplained or low blood sugars.  PHYSICAL ACTIVITY/REHABILITATION Goal is 30 minutes at least 4 days per week  Activity: No driving Therapies: see above Return to work: N/A  Activity decreases your risk of heart attack and stroke and makes your heart stronger.  It helps control your weight and blood pressure; helps you relax and can improve your mood.  Participate in a  regular exercise program.  Talk with your doctor about the best form of exercise for you (dancing, walking, swimming, cycling).  DIET/WEIGHT Goal is to maintain a healthy weight  Your discharge diet is:  Diet Order            Diet heart healthy/carb modified Room service appropriate? Yes; Fluid consistency: Thin  Diet effective now                liquids Your height is:  Height: '5\' 3"'$  (160 cm) Your current weight is: Weight: 69.6 kg (rewt by RN) Your Body Mass Index (BMI) is:  BMI (Calculated): 27.19  Following the type of diet specifically designed for you will help prevent another stroke.  Your goal weight is:    Your goal Body Mass Index (BMI) is 19-24.  Healthy food habits can help reduce 3 risk factors for stroke:  High cholesterol, hypertension, and excess weight.  RESOURCES  Stroke/Support Group:  Call [phone]   STROKE EDUCATION PROVIDED/REVIEWED AND GIVEN TO PATIENT Stroke warning signs and symptoms How to activate emergency medical system (call 911). Medications prescribed at discharge. Need for follow-up after discharge. Personal risk factors for stroke. Pneumonia vaccine given:  Flu vaccine given:  My questions have been answered, the writing is legible, and I understand these instructions.  I will adhere to these goals & educational materials that have been provided to me after my discharge from the hospital.      My questions have been answered and I understand these instructions. I will adhere to these goals and the provided educational materials after my discharge from the hospital.  Patient/Caregiver Signature _______________________________ Date __________  Clinician Signature _______________________________________ Date __________  Please bring this form and your medication list with you to all your follow-up doctor's appointments.

## 2020-02-26 NOTE — Progress Notes (Signed)
[place] PHYSICAL MEDICINE & REHABILITATION PROGRESS NOTE   Subjective/Complaints: No complaints this morning Ready for d/c Cr increased today, BMP is otherwise stable CBGs have been better controlled with increase in Lantus but still elevated, increase further today  ROS: Denies CP, SOB, N/V/D  Objective:   No results found. Recent Labs    02/24/20 0529  WBC 8.1  HGB 10.2*  HCT 31.8*  PLT 228   Recent Labs    02/24/20 0529 02/26/20 0510  NA 138 139  K 3.7 4.0  CL 105 106  CO2 22 23  GLUCOSE 103* 109*  BUN 49* 52*  CREATININE 2.59* 2.75*  CALCIUM 9.0 8.9    Intake/Output Summary (Last 24 hours) at 02/26/2020 1123 Last data filed at 02/26/2020 0900 Gross per 24 hour  Intake 834 ml  Output --  Net 834 ml        Physical Exam: Vital Signs Blood pressure (!) 146/52, pulse (!) 57, temperature 97.9 F (36.6 C), temperature source Oral, resp. rate 18, height '5\' 3"'$  (1.6 m), weight 70.3 kg, SpO2 100 %. Gen: no distress, normal appearing HEENT: oral mucosa pink and moist, NCAT Cardio:  +Murmur. Bradycardic Respiratory: Normal effort.  No stridor.  Bilateral clear to auscultation. GI: Non-distended.  BS +. Skin: Warm and dry.  Intact. Psych: Normal mood.  Normal behavior. Musc: No edema in extremities.  No tenderness in extremities. Neurologic: Alert Motor: 5/5 RUE/RLE 3/5 left deltoid, bicep, tricep, grip, 5/5 Right and 2- Left hip flexor, knee extensors, 5/5 R and 0/5 L ankle dorsiflexor and plantar flexor  Assessment/Plan: 1. Functional deficits which require 3+ hours per day of interdisciplinary therapy in a comprehensive inpatient rehab setting.  Physiatrist is providing close team supervision and 24 hour management of active medical problems listed below.  Physiatrist and rehab team continue to assess barriers to discharge/monitor patient progress toward functional and medical goals  Care Tool:  Bathing    Body parts bathed by patient: Right  arm,Face,Left arm,Chest,Front perineal area,Abdomen,Buttocks,Right upper leg,Left upper leg,Right lower leg,Left lower leg         Bathing assist Assist Level: Supervision/Verbal cueing     Upper Body Dressing/Undressing Upper body dressing   What is the patient wearing?: Pull over shirt    Upper body assist Assist Level: Independent with assistive device    Lower Body Dressing/Undressing Lower body dressing      What is the patient wearing?: Underwear/pull up,Pants     Lower body assist Assist for lower body dressing: Independent with assitive device     Toileting Toileting    Toileting assist Assist for toileting: Independent with assistive device     Transfers Chair/bed transfer  Transfers assist     Chair/bed transfer assist level: Independent with assistive device Chair/bed transfer assistive device: Other (rollator)   Locomotion Ambulation   Ambulation assist      Assist level: Supervision/Verbal cueing Assistive device: Rollator Max distance: >300 ft   Walk 10 feet activity   Assist     Assist level: Independent with assistive device Assistive device: Rollator   Walk 50 feet activity   Assist    Assist level: Supervision/Verbal cueing Assistive device: Rollator    Walk 150 feet activity   Assist Walk 150 feet activity did not occur: Safety/medical concerns  Assist level: Supervision/Verbal cueing Assistive device: Rollator    Walk 10 feet on uneven surface  activity   Assist Walk 10 feet on uneven surfaces activity did not occur: Safety/medical concerns  Assist level: Supervision/Verbal cueing Assistive device: Rollator   Wheelchair     Assist Will patient use wheelchair at discharge?: No             Wheelchair 50 feet with 2 turns activity    Assist            Wheelchair 150 feet activity     Assist          Blood pressure (!) 146/52, pulse (!) 57, temperature 97.9 F (36.6 C),  temperature source Oral, resp. rate 18, height '5\' 3"'$  (1.6 m), weight 70.3 kg, SpO2 100 %.     Medical Problem List and Plan: 1.  Impaired mobility and ADLs secondary to right ACA infarcts  DC home today 2.  Antithrombotics: -DVT/anticoagulation:  Mechanical: Sequential compression devices, below knee Bilateral lower extremities             -antiplatelet therapy: Transient thrombocytopenia has resolved. ASA to start? 3. Pain Management: tylenol prn for back/right hip pain.  4. Mood: LCSW to follow for evaluation and support.              -antipsychotic agents: N/A 5. Neuropsych: This patient is capable of making decisions on her own behalf. 6. Skin/Wound Care: Routine pressure relief measures  7. Fluids/Electrolytes/Nutrition: Monitor I/Os.  8. CAD/TVAR: Followed by Dr. Virgina Jock. Plavix was d/c due to bleed. Continue Imdur, Crestor and Demadex. 9. HTN: Monitor BP tid--continue Amlodipine, Demadex, Hydralazine and Imdur.  Vitals:   02/25/20 1928 02/26/20 0529  BP: (!) 142/50 (!) 146/52  Pulse: (!) 57 (!) 57  Resp: 20 18  Temp: 100 F (37.8 C) 97.9 F (36.6 C)  SpO2: 100% 100%   +Orthostasis on 2/5  2/9: systolic elevated (less so) and diastolic low: educated patient: monitor outpatient 9. T2DM with neuropathy/retionopathy: Hgb A1c- 7.7.CBGs ranging 70-160.               Continue Lantus 10 units with SSI for elevated BS. Will monitor BS ac/hs  CBG (last 3)  Recent Labs    02/25/20 1628 02/25/20 2100 02/26/20 0613  GLUCAP 194* 153* 105*   2/9: CBG 105-194: increase Lantus to 12U, provided dietary education 10. CKD III/IV:   Filed Weights   02/25/20 0310 02/25/20 0637 02/26/20 0538  Weight: 74 kg 69.6 kg 70.3 kg   Cr. 2.67 on 2/4, reviewed 2/7 and improved to 2.59, up to 2.75 on 2/9, repeat outpatient  Encourage fluids 11. Hypokalemia  K+ 3.7 on 2/4  Cont supplement   >30 minutes spent in discharge of patient including review of medications and follow-up appointments,  physical examination, and in answering all patient's questions  LOS: 9 days A FACE TO FACE EVALUATION WAS [place] 02/26/2020, 11:23 AM

## 2020-02-26 NOTE — Progress Notes (Signed)
Patient w/o distress or discomfort throughout shift, states she may be discharging today to another Rehab facility.

## 2020-02-26 NOTE — Progress Notes (Signed)
Inpatient Rehabilitation Care Coordinator Discharge Note  The overall goal for the admission was met for:   Discharge location: Yes, home   Length of Stay: Yes, 9 Days  Discharge activity level: Yes, ambulatory level Supervision  Home/community participation: Yes  Services provided included: MD, RD, PT, OT, SLP, RN, CM, TR, Pharmacy, [place]: Private Insurance: [employer]  Choices offered to/list presented [id number]   Follow-up services arranged: Home Health: Manufacturing engineer ([facility])  Comments (or additional information): PT OT B[facility], [place]  Patient/Family verbalized understanding of follow-up arrangements: Yes  Individual responsible for coordination of the follow-up plan: Patient, [phone]  Confirmed correct DME delivered: Dyanne Iha 02/26/2020    Dyanne Iha

## 2020-02-26 NOTE — Plan of Care (Signed)
  Problem: RH Balance Goal: LTG Patient will maintain dynamic standing balance (PT) Description: LTG:  Patient will maintain dynamic standing balance with assistance during mobility activities (PT) Outcome: Completed/Met Flowsheets (Taken 02/25/2020 1711) LTG: Pt will maintain dynamic standing balance during mobility activities with:: Supervision/Verbal cueing   Problem: Sit to Stand Goal: LTG:  Patient will perform sit to stand with assistance level (PT) Description: LTG:  Patient will perform sit to stand with assistance level (PT) Outcome: Completed/Met Flowsheets (Taken 02/25/2020 1711) LTG: PT will perform sit to stand in preparation for functional mobility with assistance level: Independent with assistive device   Problem: RH Bed Mobility Goal: LTG Patient will perform bed mobility with assist (PT) Description: LTG: Patient will perform bed mobility with assistance, with/without cues (PT). Outcome: Completed/Met Flowsheets (Taken 02/25/2020 1711) LTG: Pt will perform bed mobility with assistance level of: Independent with assistive device    Problem: RH Bed to Chair Transfers Goal: LTG Patient will perform bed/chair transfers w/assist (PT) Description: LTG: Patient will perform bed to chair transfers with assistance (PT). Outcome: Completed/Met Flowsheets (Taken 02/25/2020 1711) LTG: Pt will perform Bed to Chair Transfers with assistance level: Independent with assistive device    Problem: RH Car Transfers Goal: LTG Patient will perform car transfers with assist (PT) Description: LTG: Patient will perform car transfers with assistance (PT). Outcome: Completed/Met Flowsheets (Taken 02/25/2020 1711) LTG: Pt will perform car transfers with assist:: Supervision/Verbal cueing   Problem: RH Ambulation Goal: LTG Patient will ambulate in controlled environment (PT) Description: LTG: Patient will ambulate in a controlled environment, # of feet with assistance (PT). Outcome:  Completed/Met Flowsheets Taken 02/25/2020 1711 LTG: Pt will ambulate in controlled environ  assist needed:: Supervision/Verbal cueing Taken 02/18/2020 1802 LTG: Ambulation distance in controlled environment: at least 200 feet using LRAD Goal: LTG Patient will ambulate in home environment (PT) Description: LTG: Patient will ambulate in home environment, # of feet with assistance (PT). Outcome: Completed/Met Flowsheets Taken 02/25/2020 1711 LTG: Pt will ambulate in home environ  assist needed:: Supervision/Verbal cueing Taken 02/18/2020 1802 LTG: Ambulation distance in home environment: at least 50 feet using LRAD   Problem: RH Stairs Goal: LTG Patient will ambulate up and down stairs w/assist (PT) Description: LTG: Patient will ambulate up and down # of stairs with assistance (PT) Outcome: Completed/Met Flowsheets Taken 02/25/2020 1711 LTG: Pt will ambulate up/down stairs assist needed:: Supervision/Verbal cueing Taken 02/18/2020 1802 LTG: Pt will  ambulate up and down number of stairs: at least 2 steps using HR setup as per home environment Note: Although pt is discharging to brother's home with 2 STE, she has a complete flight of steps at home. She was able to complete 16 steps using BHR and close supervision using step-to pattern with slow, but safe progression and no vc required. Mild fatigue at completion, but confident she will be able to perform at home to reach BR/ BA when she returns home in a few weeks.

## 2020-02-27 NOTE — Progress Notes (Signed)
Recreational Therapy Discharge Summary Patient Details  Name: [name] MRN: [id number] Date of Birth: 1940/04/01 Today's Date: 02/27/2020  Long term goals set: 1  Long term goals met: 1  Comments on progress toward goals: Pt made great progress during LOS- LOS was evened shortened due to progress.  Pt did not participate in community reintegration tasks during TR session, but is documented that she ambulated through hospital gift shop/atrium area with rollator >300' with distant supervision/ contact guard assist over mulch and ramp.  TR sessions focused on activity analysis/potential modifications and coping strategies.  Goal met.  Reasons goals not met: n/a  Equipment acquired: n/a Reasons for discharge: discharge from hospital   Patient/family agrees with progress made and goals achieved: Yes  Clarene Curran 02/27/2020, 4:15 PM

## 2020-02-28 DIAGNOSIS — N179 Acute kidney failure, unspecified: Secondary | ICD-10-CM

## 2020-02-28 DIAGNOSIS — N189 Chronic kidney disease, unspecified: Secondary | ICD-10-CM

## 2020-02-28 DIAGNOSIS — D649 Anemia, unspecified: Secondary | ICD-10-CM

## 2020-02-28 NOTE — Discharge Summary (Signed)
Physician Discharge Summary  Patient ID: [name] MRN: [id number] DOB/AGE: 1940/04/12 80 y.o.  Admit date: 02/17/2020 Discharge date: 02/26/2020  Discharge Diagnoses:  Principal Problem:   Cerebral infarction due to embolism of right anterior cerebral artery [facility]) Active Problems:   Ischemic stroke of frontal lobe (HCC)   Hypokalemia   Diabetic peripheral neuropathy (HCC)   Benign essential HTN   Anemia   Acute on chronic renal failure (HCC)   Discharged Condition: Stable  Significant Diagnostic Studies: N/AA   Labs:  Basic Metabolic Panel: BMP Latest Ref Rng & Units 02/26/2020 02/24/2020 02/21/2020  Glucose 70 - 99 mg/dL 109(H) 103(H) 93  BUN 8 - 23 mg/dL 52(H) 49(H) 51(H)  Creatinine 0.44 - 1.00 mg/dL 2.75(H) 2.59(H) 2.67(H)  BUN/Creat Ratio 12 - 28 - - -  Sodium 135 - 145 mmol/L 139 138 138  Potassium 3.5 - 5.1 mmol/L 4.0 3.7 3.7  Chloride 98 - 111 mmol/L 106 105 104  CO2 22 - 32 mmol/L 23 22 21(L)  Calcium 8.9 - 10.3 mg/dL 8.9 9.0 9.1    CBC: CBC Latest Ref Rng & Units 02/24/2020 02/18/2020 02/17/2020  WBC 4.0 - 10.5 K/uL 8.1 8.5 11.4(H)  Hemoglobin 12.0 - 15.0 g/dL 10.2(L) 10.4(L) 10.8(L)  Hematocrit 36.0 - 46.0 % 31.8(L) 30.8(L) 32.1(L)  Platelets 150 - 400 K/uL 228 173 169    CBG: Recent Labs  Lab 02/25/20 1118 02/25/20 1628 02/25/20 2100 02/26/20 0613 02/26/20 1130  GLUCAP 169* 194* 153* 105* 132*    Brief HPI:   [name] is a 80 y.o. female with history of T2DM with peripheral neuropathy and retinopathy, RA, CKD, CVA, CAD/AS s/p TVAR who was admitted on 02/08/2020 with reports of headaches, left ~right-sided weakness and difficulty walking.  MRI brain negative.  On 01/25 she developed sudden onset of LLE numbness with left-sided weakness.  MRI/MRA brain repeated and showed interval development of 5 mm acute infarct in right frontoparietal motor cortex.  She received TPA and follow-up MRI showed 2 foci of acute parenchymal hemorrhage in mid to posterior  right frontal lobe with mild surrounding edema and to unchanged acute/subacute right frontal lobe infarct.  Antiplatelets were held due to hemorrhagic conversion and ASA resumed at discharge on 01/31.  There was question of transient episode of A. fib on 01/30 and loop recorder was placed for work-up of cryptogenic stroke.  Patient with resultant functional decline with impaired vision, joint pain, sensory deficits as well as focal weakness.  CIR was recommended due to current deficits.   Hospital Course: [name] was admitted to rehab 02/17/2020 for inpatient therapies to consist of PT, ST and OT at least three hours five days a week. Past admission physiatrist, therapy team and rehab RN have worked together to provide customized collaborative inpatient rehab.She continues on ASA alone for stroke prevention and is tolerating this without side effects. Follow up check of lytes showed hypokalemia and potassium supplement was added with improvement.  Serial check of CBC showed H&H to be relatively stable but has declined compared to original admission and recommend repeat CBC in couple weeks to monitor for stability.    Serial check of electrolytes showed elevation in BUN and she was advised to increase fluid intake.  Serum creatinine has trended back up to 2.75. Blood pressures were monitored on TID basis and has been relatively controlled overall.  Respiratory status has been stable and no signs of overload noted.  Her blood sugars were monitored with ac/hs CBG  checks and SSI was use prn for tighter BS control. Lantus was titrated up to 12 units with recommendations to continue monitoring blood sugars past discharge.  She has made steady progress during his stay and is currently at modified independent to supervision level.  She will continue to receive further follow-up HHPT and HHOT by [facility] after discharge.   Rehab course: During patient's stay in rehab weekly team conferences were held to  monitor patient's progress, set goals and discuss barriers to discharge. At admission, patient required min assist with ADL tasks and with mobility. She  has had improvement in activity tolerance, balance, postural control as well as ability to compensate for deficits. She has had improvement in functional use LUE  and LLE as well as improvement in awareness.  She is able to complete ADL tasks at modified independent level. She is independent for transfers required supervision with ambulation.  Berg balance score has improved from 33/ 56 to 50/56.  Family was educated regarding need for supervision after discharge.    Discharge disposition: 01-Home or Self Care  Diet: Heart healthy/carb modified  Special Instructions: 1.  Recommend repeat CBC in 1-2 weeks to monitor H&H. 2.  Recommend repeat be met in 1 to 2 weeks to monitor renal status.    Allergies as of 02/26/2020   No Known Allergies     Medication List    STOP taking these medications   amoxicillin 500 MG tablet Commonly known as: AMOXIL   B-D INS SYR ULTRAFINE 1CC/30G 30G X 1/2" 1 ML Misc Generic drug: Insulin Syringe-Needle U-100   BiDil 20-37.5 MG tablet Generic drug: isosorbide-hydrALAZINE   Desvenlafaxine ER 100 MG Tb24   leflunomide 10 MG tablet Commonly known as: ARAVA   niacin 500 MG CR tablet Commonly known as: NIASPAN   nortriptyline 10 MG capsule Commonly known as: PAMELOR     TAKE these medications   acetaminophen 325 MG tablet Commonly known as: TYLENOL Take 650 mg by mouth every 6 (six) hours as needed for moderate pain or headache.   allopurinol 100 MG tablet Commonly known as: ZYLOPRIM Take 50 mg by mouth daily.   amLODipine 10 MG tablet Commonly known as: NORVASC TAKE 1 TABLET BY MOUTH EVERY DAY   aspirin EC 81 MG tablet Take 1 tablet (81 mg total) by mouth daily. Swallow whole.   brimonidine 0.2 % ophthalmic solution Commonly known as: ALPHAGAN   calcitRIOL 0.25 MCG capsule Commonly  known as: ROCALTROL Take 0.25 mcg by mouth daily.   cetirizine 10 MG tablet Commonly known as: ZYRTEC Take 10 mg by mouth daily as needed for allergies.   folic acid 1 MG tablet Commonly known as: FOLVITE Take 1 mg by mouth daily.   HumaLOG KwikPen 100 UNIT/ML KwikPen Generic drug: insulin lispro Inject 2-8 Units into the skin 3 (three) times daily before meals.   hydrALAZINE 50 MG tablet Commonly known as: APRESOLINE Take 1 tablet (50 mg total) by mouth every 8 (eight) hours.   insulin glargine 100 UNIT/ML injection Commonly known as: LANTUS Inject 0.12 mLs (12 Units total) into the skin at bedtime. What changed: how much to take   isosorbide mononitrate 30 MG 24 hr tablet Commonly known as: IMDUR Take 1 tablet (30 mg total) by mouth daily.   labetalol 200 MG tablet Commonly known as: NORMODYNE Take 1 tablet (200 mg total) by mouth 2 (two) times daily.   nitroGLYCERIN 0.4 MG SL tablet Commonly known as: NITROSTAT PLACE 1 TABLET (  0.4 MG TOTAL) UNDER THE TONGUE EVERY 5 MINUTES X 3 DOSES AS NEEDED FOR CHEST PAIN. What changed: See the new instructions.   ONE TOUCH ULTRA TEST test strip Generic drug: glucose blood 1 each by Other route 3 (three) times daily between meals.   polyethylene glycol 17 g packet Commonly known as: MIRALAX / GLYCOLAX Take 17 g by mouth daily.   polyvinyl alcohol 1.4 % ophthalmic solution Commonly known as: LIQUIFILM TEARS Place 1 drop into both eyes 3 (three) times daily as needed for dry eyes.   potassium chloride 10 MEQ tablet Commonly known as: KLOR-CON Take 1 tablet (10 mEq total) by mouth daily.   rosuvastatin 40 MG tablet Commonly known as: CRESTOR Take 1 tablet (40 mg total) by mouth daily.   senna-docusate 8.6-50 MG tablet Commonly known as: Senokot-S Take 2 tablets by mouth 2 (two) times daily.   torsemide 20 MG tablet Commonly known as: DEMADEX Take 2 tablets (40 mg total) by mouth daily.   venlafaxine XR 150 MG 24 hr  capsule Commonly known as: EFFEXOR-XR Take 1 capsule (150 mg total) by mouth daily.       Follow-up Information    Audley Hose, MD. Call.   Specialty: Internal Medicine Why: for post hospital follow up Contact information: [place] [address] [phone]        Charlett Blake, MD Follow up.   Specialty: Physical Medicine and Rehabilitation Why: Office will call you with follow up appointment Contact information: [place] [address] [phone]        [facility] Follow up.   Why: Call for appointment if you have not heard by Monday Contact information: [address]     [address] [place] [address] [place] [facility] Follow up on 03/03/2020.   Specialty: Cardiology Why: For wound care check.  Contact information: [address], [place]       Madelon Lips, MD Follow up.   Specialty: Nephrology Why: for follow up appointment Contact information: [place] [address] [phone]               Signed: Bary Leriche 02/28/2020, 7:25 PM

## 2020-03-02 ENCOUNTER — Ambulatory Visit (INDEPENDENT_AMBULATORY_CARE_PROVIDER_SITE_OTHER): Payer: Medicare PPO | Admitting: Ophthalmology

## 2020-03-02 ENCOUNTER — Encounter (INDEPENDENT_AMBULATORY_CARE_PROVIDER_SITE_OTHER): Payer: Self-pay | Admitting: Ophthalmology

## 2020-03-02 ENCOUNTER — Other Ambulatory Visit: Payer: Self-pay

## 2020-03-02 DIAGNOSIS — H43811 Vitreous degeneration, right eye: Secondary | ICD-10-CM | POA: Diagnosis not present

## 2020-03-02 DIAGNOSIS — H348122 Central retinal vein occlusion, left eye, stable: Secondary | ICD-10-CM | POA: Diagnosis not present

## 2020-03-02 DIAGNOSIS — E113551 Type 2 diabetes mellitus with stable proliferative diabetic retinopathy, right eye: Secondary | ICD-10-CM

## 2020-03-02 NOTE — Assessment & Plan Note (Signed)
Previous treatment with intravitreal Avastin ceased 10-21-19 due to secondary hypertension concerns raised in December 2021.  Left eye with significant CME secondary to CRV O was treated with focal grid nasal to the fovea with nice effect and resolution of CME component of disease left eye we will continue to monitor and observe

## 2020-03-02 NOTE — Progress Notes (Signed)
03/02/2020     CHIEF COMPLAINT Patient presents for Retina Follow Up (2 MO F/U, Pt had 2 strokes 02/08/20///Pt reports vision has become blurry OS>OD, pt reports new dark floaters that come and go. Pt denies any pain or pressure OU. ////Last A1C: around 7 taken 01/2020//Last BS: 72 this AM )   HISTORY OF PRESENT ILLNESS: [name] is a 80 y.o. female who presents to the clinic today for:   HPI    Retina Follow Up    In both eyes.  This started 2 months ago.  Duration of 2 weeks.  Since onset it is gradually worsening. Additional comments: 2 MO F/U, Pt had 2 strokes 02/08/20   Pt reports vision has become blurry OS>OD, pt reports new dark floaters that come and go. Pt denies any pain or pressure OU.     Last A1C: around 7 taken 01/2020  Last BS: 72 this AM        Last edited by Nichola Sizer D on 03/02/2020  8:23 AM. (History)      Referring physician: Audley Hose, MD [place],  [place] [address]  HISTORICAL INFORMATION:   Selected notes from the MEDICAL RECORD NUMBER    Lab Results  Component Value Date   HGBA1C 7.7 (H) 02/11/2020     CURRENT MEDICATIONS: Current Outpatient Medications (Ophthalmic Drugs)  Medication Sig  . brimonidine (ALPHAGAN) 0.2 % ophthalmic solution Place into the left eye once.  . polyvinyl alcohol (LIQUIFILM TEARS) 1.4 % ophthalmic solution Place 1 drop into both eyes 3 (three) times daily as needed for dry eyes.   No current facility-administered medications for this visit. (Ophthalmic Drugs)   Current Outpatient Medications (Other)  Medication Sig  . acetaminophen (TYLENOL) 325 MG tablet Take 650 mg by mouth every 6 (six) hours as needed for moderate pain or headache.   . allopurinol (ZYLOPRIM) 100 MG tablet Take 50 mg by mouth daily.  Marland Kitchen amLODipine (NORVASC) 10 MG tablet TAKE 1 TABLET BY MOUTH EVERY DAY (Patient taking differently: Take 10 mg by mouth daily.)  . aspirin EC 81 MG tablet Take 1  tablet (81 mg total) by mouth daily. Swallow whole.  . calcitRIOL (ROCALTROL) 0.25 MCG capsule Take 0.25 mcg by mouth daily.   . cetirizine (ZYRTEC) 10 MG tablet Take 10 mg by mouth daily as needed for allergies.  . folic acid (FOLVITE) 1 MG tablet Take 1 mg by mouth daily.  Marland Kitchen HUMALOG KWIKPEN 100 UNIT/ML KwikPen Inject 2-8 Units into the skin 3 (three) times daily before meals.   . hydrALAZINE (APRESOLINE) 50 MG tablet Take 1 tablet (50 mg total) by mouth every 8 (eight) hours.  . insulin glargine (LANTUS) 100 UNIT/ML injection Inject 0.12 mLs (12 Units total) into the skin at bedtime.  . isosorbide mononitrate (IMDUR) 30 MG 24 hr tablet Take 1 tablet (30 mg total) by mouth daily.  Marland Kitchen labetalol (NORMODYNE) 200 MG tablet Take 1 tablet (200 mg total) by mouth 2 (two) times daily.  . nitroGLYCERIN (NITROSTAT) 0.4 MG SL tablet PLACE 1 TABLET (0.4 MG TOTAL) UNDER THE TONGUE EVERY 5 MINUTES X 3 DOSES AS NEEDED FOR CHEST PAIN. (Patient taking differently: Place 0.4 mg under the tongue every 5 (five) minutes as needed for chest pain.)  . ONE TOUCH ULTRA TEST test strip 1 each by Other route 3 (three) times daily between meals.  . polyethylene glycol (MIRALAX / GLYCOLAX) 17 g packet Take 17 g by  mouth daily.  . potassium chloride (KLOR-CON) 10 MEQ tablet Take 1 tablet (10 mEq total) by mouth daily.  . rosuvastatin (CRESTOR) 40 MG tablet Take 1 tablet (40 mg total) by mouth daily.  Marland Kitchen senna-docusate (SENOKOT-S) 8.6-50 MG tablet Take 2 tablets by mouth 2 (two) times daily.  Marland Kitchen torsemide (DEMADEX) 20 MG tablet Take 2 tablets (40 mg total) by mouth daily.  Marland Kitchen venlafaxine XR (EFFEXOR-XR) 150 MG 24 hr capsule Take 1 capsule (150 mg total) by mouth daily.   No current facility-administered medications for this visit. (Other)      REVIEW OF SYSTEMS:    ALLERGIES No Known Allergies  PAST MEDICAL HISTORY Past Medical History:  Diagnosis Date  . Arthritis   . Cerebrovascular disease   . CKD (chronic  kidney disease)    Sees Dr Hollie Salk  . Coronary artery disease   . Depression   . Diabetes ([place])    INSULIN DEPENDENT  . Diabetic peripheral neuropathy ([place])   . Diabetic retinopathy ([place])   . Diverticulitis   . GERD (gastroesophageal reflux disease)   . History of CVA (cerebrovascular accident)    x 2 no residulal  . Hyperlipidemia   . Hypertension   . Obesity   . Renal lesion   . S/P TAVR (transcatheter aortic valve replacement) 11/13/2018   s/p TAVR with a 65m Edwards S3U via the TF approach by Dr. ORoxy Mannsand Dr. MAngelena Form . Severe aortic stenosis    Past Surgical History:  Procedure Laterality Date  . ABDOMINAL HYSTERECTOMY    . AV FISTULA PLACEMENT Left 12/18/2017   Procedure: ARTERIOVENOUS (AV) FISTULA CREATION ARM;  Surgeon: CMarty Heck MD;  [facility]  Service: Vascular;  Laterality: Left;  . bilateral cataract surgery    . CARDIAC CATHETERIZATION  01/07/2014   DR GKennedyRESECTION  02/1999  . COLONOSCOPY    . COLOSTOMY  02/1999  . COLOSTOMY CLOSURE  07/1999  . EYE SURGERY Left 2019  . FISTULA SUPERFICIALIZATION Left 06/29/2018   Procedure: FISTULA SUPERFICIALIZATION LEFT BRACHIOCEPHALIC;  Surgeon: CMarty Heck MD;  [facility]  Service: Vascular;  Laterality: Left;  . LEFT HEART CATHETERIZATION WITH CORONARY ANGIOGRAM N/A 01/07/2014   Procedure: LEFT HEART CATHETERIZATION WITH CORONARY ANGIOGRAM;  Surgeon: JLaverda Page MD;  [facility];  Service: Cardiovascular;  Laterality: N/A;  . LOOP RECORDER INSERTION N/A 02/17/2020   Procedure: LOOP RECORDER INSERTION;  Surgeon: KDeboraha Sprang MD;  [facility];  Service: Cardiovascular;  Laterality: N/A;  . middle cerebral artery stent placement Right   . OTHER SURGICAL HISTORY     laser surgery  . PTCA  01/07/2014   DES to RCA    DR GEinar Gip . right knee surgery Right    for infection  . RIGHT/LEFT HEART CATH AND CORONARY ANGIOGRAPHY N/A 10/23/2018    Procedure: RIGHT/LEFT HEART CATH AND CORONARY ANGIOGRAPHY;  Surgeon: PNigel Mormon MD;  [facility];  Service: Cardiovascular;  Laterality: N/A;  . TEE WITHOUT CARDIOVERSION N/A 11/13/2018   Procedure: TRANSESOPHAGEAL ECHOCARDIOGRAM (TEE);  Surgeon: MBurnell Blanks MD;  [facility]  Service: Open Heart Surgery;  Laterality: N/A;  . TRANSCATHETER AORTIC VALVE REPLACEMENT, TRANSFEMORAL  11/13/2018  . TRANSCATHETER AORTIC VALVE REPLACEMENT, TRANSFEMORAL N/A 11/13/2018   Procedure: TRANSCATHETER AORTIC VALVE REPLACEMENT, TRANSFEMORAL;  Surgeon: MBurnell Blanks MD;  [facility]  Service: Open Heart Surgery;  Laterality: N/A;  FAMILY HISTORY Family History  Problem Relation Age of Onset  . Diabetes Mother   . Heart disease Mother   . Prostate cancer Father   . Hypertension Brother   . Prostate cancer Brother     SOCIAL HISTORY Social History   Tobacco Use  . Smoking status: Never Smoker  . Smokeless tobacco: Never Used  Vaping Use  . Vaping Use: Never used  Substance Use Topics  . Alcohol use: No  . Drug use: No         OPHTHALMIC EXAM:  Base Eye Exam    Visual Acuity (ETDRS)      Right Left   Dist [place] 20/20 CF at 3'   Dist ph [place]  NI       Tonometry (Tonopen, 8:31 AM)      Right Left   Pressure 22 23  Looking away and squinting       Pupils      Dark Light Shape React APD   Right 4 4 Round Minimal None   Left 5 5 Irregular Minimal None       Visual Fields (Counting fingers)      Left Right   Restrictions Total superior nasal, inferior nasal deficiencies; Partial outer inferior temporal deficiency Total superior temporal deficiency       Extraocular Movement      Right Left    Full Full       Neuro/Psych    Oriented x3: Yes   Mood/Affect: Normal       Dilation    Both eyes: 1.0% Mydriacyl, 2.5% Phenylephrine @ 8:31 AM        Slit Lamp and Fundus Exam    External Exam      Right Left    External Normal Normal       Slit Lamp Exam      Right Left   Lids/Lashes Normal Normal   Conjunctiva/Sclera White and quiet White and quiet   Cornea Band keratopathy at limbus not in vis axis Clear   Anterior Chamber Deep and quiet Deep and quiet   Iris Round and reactive Round and reactive   Lens Centered posterior chamber intraocular lens Centered anterior chamber intraocular lens   Anterior Vitreous Normal Normal       Fundus Exam      Right Left   Posterior Vitreous Posterior vitreous detachment vitrectomized   Disc Normal Normal, pink   C/D Ratio 0.6 0.6   Macula Normal  Cystoid macular edema less, no exudates, Macular thickening, Microaneurysms, no cystoid macular edema   Vessels NPDR-Severe, stable old CRVO   Periphery good peripheral prp good prp           IMAGING AND PROCEDURES  Imaging and Procedures for 03/02/20  OCT, Retina - OU - Both Eyes       Right Eye Quality was good. Scan locations included subfoveal. Central Foveal Thickness: 260.   Left Eye Quality was good. Scan locations included subfoveal. Central Foveal Thickness: 233. Progression has improved. Findings include abnormal foveal contour.   Notes Macular thickening particularly nasal to the fovea but vastly improved as compared to January 07, 2020 prior to focal laser treatment, OS,  Off intravitreal Avastin OS since 10/21/2019  OD normal macular findings incidental posterior vitreous detachment                ASSESSMENT/PLAN:  Central retinal vein occlusion of left eye Quiescent proliferative portion of disease status post previous PRP in the  past with no active regions of macular or peripheral nonperfusion today.  Central retinal vein occlusion, left eye, stable Previous treatment with intravitreal Avastin ceased 10-21-19 due to secondary hypertension concerns raised in December 2021.  Left eye with significant CME secondary to CRV O was treated with focal grid nasal to the fovea  with nice effect and resolution of CME component of disease left eye we will continue to monitor and observe  Stable treated proliferative diabetic retinopathy of right eye without macular edema determined by examination associated with type 2 diabetes mellitus ([place]) Stabilized OD with previous PRP, no active maculopathy we will continue to monitor  Patient had recent hypertensive crisis related stroke event, and we can confirm that there is no visual deficit from this upon evaluation      ICD-10-CM   1. Stable treated proliferative diabetic retinopathy of right eye without macular edema determined by examination associated with type 2 diabetes mellitus ([place])  [id number]   2. Central retinal vein occlusion, left eye, stable  H34.8122 OCT, Retina - OU - Both Eyes  3. Posterior vitreous detachment of right eye  H43.811     1.  Patient had symptomatic vision changes in the left eye possibly associated with recent stroke embolic event that she suffered January 2022 secondary to hypertensive crisis  No objective signs of visual acuity changes nor visual field losses today on examination  2.  Patient's last treatment was with Avastin October 2021, whereupon use was not continued at December event yet her hypertensive crisis continue to accelerate  3.  Blood pressure now well controlled.  Ophthalmic Meds Ordered this visit:  No orders of the defined types were placed in this encounter.      Return in about 4 months (around 06/30/2020) for DILATE OU, OCT.  There are no Patient Instructions on file for this visit.   Explained the diagnoses, plan, and follow up with the patient and they expressed understanding.  Patient expressed understanding of the importance of proper follow up care.   Clent Demark Rankin [name] Diseases & Surgery of the Retina and Vitreous Retina & Diabetic [place] 03/02/20     Abbreviations: M myopia (nearsighted); A astigmatism; H hyperopia (farsighted); P  presbyopia; Mrx spectacle prescription;  CTL contact lenses; OD right eye; OS left eye; OU both eyes  XT exotropia; ET esotropia; PEK punctate epithelial keratitis; PEE punctate epithelial erosions; DES dry eye syndrome; MGD meibomian gland dysfunction; ATs artificial tears; PFAT's preservative free artificial tears; [place] nuclear sclerotic cataract; PSC posterior subcapsular cataract; ERM epi-retinal membrane; PVD posterior vitreous detachment; RD retinal detachment; DM diabetes mellitus; DR diabetic retinopathy; NPDR non-proliferative diabetic retinopathy; PDR proliferative diabetic retinopathy; CSME clinically significant macular edema; DME diabetic macular edema; dbh dot blot hemorrhages; CWS cotton wool spot; POAG primary open angle glaucoma; C/D cup-to-disc ratio; HVF humphrey visual field; GVF goldmann visual field; OCT optical coherence tomography; IOP intraocular pressure; BRVO Branch retinal vein occlusion; CRVO central retinal vein occlusion; CRAO central retinal artery occlusion; BRAO branch retinal artery occlusion; RT retinal tear; SB scleral buckle; PPV pars plana vitrectomy; VH Vitreous hemorrhage; PRP panretinal laser photocoagulation; IVK intravitreal kenalog; VMT vitreomacular traction; MH Macular hole;  NVD neovascularization of the disc; NVE neovascularization elsewhere; AREDS age related eye disease study; ARMD age related macular degeneration; POAG primary open angle glaucoma; EBMD epithelial/anterior basement membrane dystrophy; ACIOL anterior chamber intraocular lens; IOL intraocular lens; PCIOL posterior chamber intraocular lens; Phaco/IOL phacoemulsification with intraocular lens placement; PRK photorefractive keratectomy; LASIK laser assisted  in situ keratomileusis; HTN hypertension; DM diabetes mellitus; COPD chronic obstructive pulmonary disease

## 2020-03-02 NOTE — Assessment & Plan Note (Signed)
Quiescent proliferative portion of disease status post previous PRP in the past with no active regions of macular or peripheral nonperfusion today.

## 2020-03-02 NOTE — Assessment & Plan Note (Addendum)
Stabilized OD with previous PRP, no active maculopathy we will continue to monitor  Patient had recent hypertensive crisis related stroke event, and we can confirm that there is no visual deficit from this upon evaluation

## 2020-03-03 ENCOUNTER — Ambulatory Visit (INDEPENDENT_AMBULATORY_CARE_PROVIDER_SITE_OTHER): Payer: Medicare PPO | Admitting: Emergency Medicine

## 2020-03-03 DIAGNOSIS — I639 Cerebral infarction, unspecified: Secondary | ICD-10-CM

## 2020-03-03 LAB — CUP PACEART INCLINIC DEVICE CHECK
Date Time Interrogation Session: 20220215151734
Implantable Pulse Generator Implant Date: 20220131

## 2020-03-03 NOTE — Progress Notes (Signed)
ILR wound check in clinic. Steri strips removed prior to visit by patient. Wound Site observed to be well healed. Home monitor transmitting nightly, patient educated to keep app running in background of mobile device.  Next scheduled monthly summary 04/03/20.   No episodes. Questions answered.

## 2020-03-19 ENCOUNTER — Other Ambulatory Visit: Payer: Self-pay | Admitting: Physical Medicine and Rehabilitation

## 2020-03-20 ENCOUNTER — Other Ambulatory Visit: Payer: Self-pay

## 2020-03-20 ENCOUNTER — Encounter: Payer: Medicare PPO | Attending: Physical Medicine & Rehabilitation | Admitting: Physical Medicine & Rehabilitation

## 2020-03-20 ENCOUNTER — Encounter: Payer: Self-pay | Admitting: Physical Medicine & Rehabilitation

## 2020-03-20 VITALS — BP 139/55 | HR 54 | Temp 98.2°F | Ht 63.0 in | Wt 161.2 lb

## 2020-03-20 DIAGNOSIS — Z8673 Personal history of transient ischemic attack (TIA), and cerebral infarction without residual deficits: Secondary | ICD-10-CM

## 2020-03-20 NOTE — Progress Notes (Signed)
Subjective:    Patient ID: [name], female    DOB: [birth year], 80 y.o.   MRN: [id number] [name] was admitted to rehab 02/17/2020 for inpatient therapies to consist of PT, ST and OT at least three hours five days a week. Past admission physiatrist, therapy team and rehab RN have worked together to provide customized collaborative inpatient rehab.She continues on ASA alone for stroke prevention and is tolerating this without side effects. Follow up check of lytes showed hypokalemia and potassium supplement was added with improvement.  Serial check of CBC showed H&H to be relatively stable but has declined compared to original admission and recommend repeat CBC in couple weeks to monitor for stability.    Serial check of electrolytes showed elevation in BUN and she was advised to increase fluid intake.  Serum creatinine has trended back up to 2.75. Blood pressures were monitored on TID basis and has been relatively controlled overall.  Respiratory status has been stable and no signs of overload noted.  Her blood sugars were monitored with ac/hs CBG checks and SSI was use prn for tighter BS control. Lantus was titrated up to 12 units with recommendations to continue monitoring blood sugars past discharge.  She has made steady progress during his stay and is currently at modified independent to supervision level.  She will continue to receive further follow-up HHPT and HHOT by [facility] after discharge.   Admit date: 02/17/2020 Discharge date: 02/26/2020  HPI Mod I dressing and bathing Brother does the cooking and cleaning  Amb with walker both inside and outside the house .  Has gone short distance without the walker in the home HHPT and OT coming out  Pt has seen PCP , endocrinologist, urologist and neurologist Patient has no questions about her therapy or recovery at this point.  Her goal is to ambulate without a walker as she did before her stroke. Pain Inventory Average Pain  0 Pain Right Now 0 My pain is no pain  LOCATION OF PAIN  BOWEL Number of stools per week:  Oral laxative use Yes  Type of laxative miralax Enema or suppository use No  History of colostomy No  Incontinent No   BLADDER Normal In and out cath, frequency na Able to self cath No  Bladder incontinence No  Frequent urination No  Leakage with coughing No  Difficulty starting stream No  Incomplete bladder emptying No    Mobility use a walker  Function retired I need assistance with the following:  meal prep  Neuro/Psych weakness trouble walking  Prior Studies Any changes since last visit?  no  Physicians involved in your care Any changes since last visit?  no   Family History  Problem Relation Age of Onset  . Diabetes Mother   . Heart disease Mother   . Prostate cancer Father   . Hypertension Brother   . Prostate cancer Brother    Social History   Socioeconomic History  . Marital status: Widowed    Spouse name: Not on file  . Number of children: 2  . Years of education: PHD  . Highest education level: Not on file  Occupational History  . Occupation: Retired PhD Pharmacist, hospital [employer]  Tobacco Use  . Smoking status: Never Smoker  . Smokeless tobacco: Never Used  Vaping Use  . Vaping Use: Never used  Substance and Sexual Activity  . Alcohol use: No  . Drug use: No  . Sexual activity: Not on file  Other Topics Concern  . Not on file  Social History Narrative   Patient is widowed and lives with her two adopted children.   Patient is retired.   Patient has two children of her own and two adopted children.   Patient has a PHD.   Patient is right-handed.   Social Determinants of Health   Financial Resource Strain: Not on file  Food Insecurity: Not on file  Transportation Needs: Not on file  Physical Activity: Not on file  Stress: Not on file  Social Connections: Not on file   Past Surgical History:  Procedure Laterality Date  . ABDOMINAL  HYSTERECTOMY    . AV FISTULA PLACEMENT Left 12/18/2017   Procedure: ARTERIOVENOUS (AV) FISTULA CREATION ARM;  Surgeon: Marty Heck, MD;  Location: [place];  Service: Vascular;  Laterality: Left;  . bilateral cataract surgery    . CARDIAC CATHETERIZATION  01/07/2014   DR [place] RESECTION  02/1999  . COLONOSCOPY    . COLOSTOMY  02/1999  . COLOSTOMY CLOSURE  07/1999  . EYE SURGERY Left 2019  . FISTULA SUPERFICIALIZATION Left 06/29/2018   Procedure: FISTULA SUPERFICIALIZATION LEFT BRACHIOCEPHALIC;  Surgeon: Marty Heck, MD;  Location: [place];  Service: Vascular;  Laterality: Left;  . LEFT HEART CATHETERIZATION WITH CORONARY ANGIOGRAM N/A 01/07/2014   Procedure: LEFT HEART CATHETERIZATION WITH CORONARY ANGIOGRAM;  Surgeon: Laverda Page, MD;  [facility];  Service: Cardiovascular;  Laterality: N/A;  . LOOP RECORDER INSERTION N/A 02/17/2020   Procedure: LOOP RECORDER INSERTION;  Surgeon: Deboraha Sprang, MD;  Location: [place] CV LAB;  Service: Cardiovascular;  Laterality: N/A;  . middle cerebral artery stent placement Right   . OTHER SURGICAL HISTORY     laser surgery  . PTCA  01/07/2014   DES to RCA    DR Einar Gip  . right knee surgery Right    for infection  . RIGHT/LEFT HEART CATH AND CORONARY ANGIOGRAPHY N/A 10/23/2018   Procedure: RIGHT/LEFT HEART CATH AND CORONARY ANGIOGRAPHY;  Surgeon: Nigel Mormon, MD;  Location: [place] CV LAB;  Service: Cardiovascular;  Laterality: N/A;  . TEE WITHOUT CARDIOVERSION N/A 11/13/2018   Procedure: TRANSESOPHAGEAL ECHOCARDIOGRAM (TEE);  Surgeon: Burnell Blanks, MD;  Location: [facility];  Service: Open Heart Surgery;  Laterality: N/A;  . TRANSCATHETER AORTIC VALVE REPLACEMENT, TRANSFEMORAL  11/13/2018  . TRANSCATHETER AORTIC VALVE REPLACEMENT, TRANSFEMORAL N/A 11/13/2018   Procedure: TRANSCATHETER AORTIC VALVE REPLACEMENT, TRANSFEMORAL;  Surgeon: Burnell Blanks, MD;  Location: [place];  Service:  Open Heart Surgery;  Laterality: N/A;   Past Medical History:  Diagnosis Date  . Arthritis   . Cerebrovascular disease   . CKD (chronic kidney disease)    Sees Dr Hollie Salk  . Coronary artery disease   . Depression   . Diabetes ([place])    INSULIN DEPENDENT  . Diabetic peripheral neuropathy ([place])   . Diabetic retinopathy ([place])   . Diverticulitis   . GERD (gastroesophageal reflux disease)   . History of CVA (cerebrovascular accident)    x 2 no residulal  . Hyperlipidemia   . Hypertension   . Obesity   . Renal lesion   . S/P TAVR (transcatheter aortic valve replacement) 11/13/2018   s/p TAVR with a 98m Edwards S3U via the TF approach by Dr. ORoxy Mannsand Dr. MAngelena Form . Severe aortic stenosis    BP (!) 139/55   Pulse (!) 54   Temp 98.2 F (36.8 C)   Ht  $'5\' 3"'m$  (1.6 m)   Wt 161 lb 3.2 oz (73.1 kg)   SpO2 97%   BMI 28.56 kg/m   Opioid Risk Score:   Fall Risk Score:  `1  Depression screen PHQ 2/9  Depression screen [facility] 2/9 03/20/2020 01/22/2019 09/15/2015  Decreased Interest 0 0 0  Down, Depressed, Hopeless 0 0 0  PHQ - 2 Score 0 0 0  Altered sleeping 0 - -  Tired, decreased energy 1 - -  Change in appetite 1 - -  Feeling bad or failure about yourself  0 - -  Trouble concentrating 0 - -  Moving slowly or fidgety/restless 3 - -  Suicidal thoughts 0 - -  PHQ-9 Score 5 - -  Some recent data might be hidden    Review of Systems  Constitutional: Negative.   HENT: Negative.   Eyes: Negative.   Respiratory: Negative.   Cardiovascular: Negative.   Gastrointestinal: Negative.   Endocrine: Negative.   Genitourinary: Negative.   Musculoskeletal: Positive for gait problem.  Skin: Negative.   Allergic/Immunologic: Negative.   Hematological: Negative.   Psychiatric/Behavioral: Negative.   All other systems reviewed and are negative.      Objective:   Physical Exam Vitals and nursing note reviewed.  Constitutional:      Appearance: She is obese.  HENT:     Head:  Normocephalic and atraumatic.     Mouth/Throat:     Mouth: Mucous membranes are moist.  Eyes:     Extraocular Movements: Extraocular movements intact.     Conjunctiva/sclera: Conjunctivae normal.     Pupils: Pupils are equal, round, and reactive to light.  Cardiovascular:     Rate and Rhythm: Normal rate and regular rhythm.     Heart sounds: Murmur heard.    Pulmonary:     Effort: Pulmonary effort is normal.     Breath sounds: Normal breath sounds. No wheezing.  Abdominal:     General: Abdomen is flat. Bowel sounds are normal.     Palpations: Abdomen is soft.     Tenderness: There is no abdominal tenderness.  Musculoskeletal:     Comments: No pain with upper limb or lower limb range of motion. No evidence of joint swelling in the hands or wrists.  Skin:    General: Skin is warm and dry.  Neurological:     Mental Status: She is alert.     Cranial Nerves: No dysarthria.     Coordination: Coordination normal.     Gait: Gait abnormal.     Comments: Motor strength is 4/5 bilateral deltoid bicep tricep grip hip flexor knee extensor ankle dorsiflexor Sensation intact light touch bilateral upper and lower limbs Ambulates with a rolling walker no evidence of toe drag or knee instability.  Psychiatric:        Mood and Affect: Mood normal.        Behavior: Behavior normal.           Assessment & Plan:  #1.  Right ACA infarct no residual focal neurologic signs.  She does have a balance disorder related to her stroke.  Recommend continue home health PT OT.  This is set to terminate month.  If she is still in need for rolling walker at that time she is to call this office write orders for outpatient PT as well as physical medicine and rehabilitation clinic follow-up.  Discussed with patient that refills of her medications will be handled through her primary care office.  She will  also follow-up with urology, neurology and her endocrinologist

## 2020-03-20 NOTE — Patient Instructions (Signed)
Please call if you continue to require a walker once Home Health therapy is finished.  I can order outpatient therapy and schedule a followup with me   Please have your priary MD refill your medications

## 2020-03-23 ENCOUNTER — Ambulatory Visit (INDEPENDENT_AMBULATORY_CARE_PROVIDER_SITE_OTHER): Payer: Medicare PPO

## 2020-03-23 DIAGNOSIS — I639 Cerebral infarction, unspecified: Secondary | ICD-10-CM | POA: Diagnosis not present

## 2020-03-25 LAB — CUP PACEART REMOTE DEVICE CHECK
Date Time Interrogation Session: 20220305174309
Implantable Pulse Generator Implant Date: 20220131

## 2020-03-27 ENCOUNTER — Other Ambulatory Visit: Payer: Self-pay | Admitting: Physical Medicine and Rehabilitation

## 2020-03-28 ENCOUNTER — Other Ambulatory Visit: Payer: Self-pay | Admitting: Physical Medicine and Rehabilitation

## 2020-03-31 NOTE — Progress Notes (Signed)
[facility] Summary Report / Loop Recorder 

## 2020-04-01 ENCOUNTER — Ambulatory Visit (INDEPENDENT_AMBULATORY_CARE_PROVIDER_SITE_OTHER): Payer: Medicare PPO | Admitting: Neurology

## 2020-04-01 ENCOUNTER — Encounter: Payer: Self-pay | Admitting: Neurology

## 2020-04-01 VITALS — BP 145/59 | HR 60 | Ht 63.0 in | Wt 163.2 lb

## 2020-04-01 DIAGNOSIS — G4733 Obstructive sleep apnea (adult) (pediatric): Secondary | ICD-10-CM | POA: Diagnosis not present

## 2020-04-01 DIAGNOSIS — Z8673 Personal history of transient ischemic attack (TIA), and cerebral infarction without residual deficits: Secondary | ICD-10-CM | POA: Diagnosis not present

## 2020-04-01 NOTE — Progress Notes (Signed)
Reason for visit: Stroke  Referring physician: [place]  [name] is a 80 y.o. female  History of present illness:  [name] is an 80 year old right-handed black female with a history of cerebrovascular disease, diabetes, hypertension, aortic stenosis, and dyslipidemia.  The patient has had several strokes over the years, she was last seen in September 2020 following a right posterior cerebral artery distribution stroke.  The patient was admitted to the hospital on 08 February 2020 with onset of some left leg greater than left arm weakness, she was noted to have significant hypertension at that time.  MRI initially did not show evidence of a stroke and she was being treated for significant hypertension when she developed recurrence of her left-sided weakness on 10 February 2018 while in the hospital.  A code stroke was called, the patient received TPA and this resulted in a small hemorrhagic conversion of the stroke in the right frontal area.  The patient was taken off of antiplatelet agents for several days, aspirin was restarted.  The patient had been on Plavix initially.  Her LDL cholesterol was 199.  The Crestor dose was increased from 20 to 40 mg daily dose.  The patient underwent some inpatient physical therapy, she is now back home getting home health physical and Occupational Therapy.  She is gradually improving with her ability to ambulate, she is using a walker but is hoping to transition to a cane soon.  She has not had any falls.  Normally, she lives alone, she has a two-level house and has to go up and down stairs.  She is able to bathe herself and dress herself currently.  The patient does have some occasional headaches.  She denies any vision changes or dizziness.  She still has some left-sided numbness.  A loop recorder has been placed.  Past Medical History:  Diagnosis Date  . Arthritis   . Cerebrovascular disease   . CKD (chronic kidney disease)    Sees Dr Hollie Salk  .  Coronary artery disease   . Depression   . Diabetes ([place])    INSULIN DEPENDENT  . Diabetic peripheral neuropathy ([place])   . Diabetic retinopathy ([place])   . Diverticulitis   . GERD (gastroesophageal reflux disease)   . History of CVA (cerebrovascular accident)    x 2 no residulal  . Hyperlipidemia   . Hypertension   . Obesity   . Renal lesion   . S/P TAVR (transcatheter aortic valve replacement) 11/13/2018   s/p TAVR with a 49m Edwards S3U via the TF approach by Dr. ORoxy Mannsand Dr. MAngelena Form . Severe aortic stenosis     Past Surgical History:  Procedure Laterality Date  . ABDOMINAL HYSTERECTOMY    . AV FISTULA PLACEMENT Left 12/18/2017   Procedure: ARTERIOVENOUS (AV) FISTULA CREATION ARM;  Surgeon: CMarty Heck MD;  [facility]  Service: Vascular;  Laterality: Left;  . bilateral cataract surgery    . CARDIAC CATHETERIZATION  01/07/2014   [place]  02[place]  . COLONOSCOPY    . COLOSTOMY  02/1999  . COLOSTOMY CLOSURE  07/1999  . EYE SURGERY Left 2019  . FISTULA SUPERFICIALIZATION Left 06/29/2018   Procedure: FISTULA SUPERFICIALIZATION LEFT BRACHIOCEPHALIC;  Surgeon: CMarty Heck MD;  [facility]  Service: Vascular;  Laterality: Left;  . LEFT HEART CATHETERIZATION WITH CORONARY ANGIOGRAM N/A 01/07/2014   Procedure: LEFT HEART CATHETERIZATION WITH CORONARY ANGIOGRAM;  Surgeon: JLaverda Page MD;  Location: [facility] CATH [facility];  Service: Cardiovascular;  Laterality: N/A;  . LOOP RECORDER INSERTION N/A 02/17/2020   Procedure: LOOP RECORDER INSERTION;  Surgeon: Deboraha Sprang, MD;  Location: [facility] CV [facility];  Service: Cardiovascular;  Laterality: N/A;  . middle cerebral artery stent placement Right   . OTHER SURGICAL HISTORY     laser surgery  . PTCA  01/07/2014   DES to RCA    DR Einar Gip  . right knee surgery Right    for infection  . RIGHT/LEFT HEART CATH AND CORONARY ANGIOGRAPHY N/A 10/23/2018   Procedure: RIGHT/LEFT HEART CATH AND CORONARY  ANGIOGRAPHY;  Surgeon: Nigel Mormon, MD;  Location: [facility] CV LAB;  Service: Cardiovascular;  Laterality: N/A;  . TEE WITHOUT CARDIOVERSION N/A 11/13/2018   Procedure: TRANSESOPHAGEAL ECHOCARDIOGRAM (TEE);  Surgeon: Burnell Blanks, MD;  Location: [facility];  Service: Open Heart Surgery;  Laterality: N/A;  . TRANSCATHETER AORTIC VALVE REPLACEMENT, TRANSFEMORAL  11/13/2018  . TRANSCATHETER AORTIC VALVE REPLACEMENT, TRANSFEMORAL N/A 11/13/2018   Procedure: TRANSCATHETER AORTIC VALVE REPLACEMENT, TRANSFEMORAL;  Surgeon: Burnell Blanks, MD;  Location: [facility];  Service: Open Heart Surgery;  Laterality: N/A;    Family History  Problem Relation Age of Onset  . Diabetes Mother   . Heart disease Mother   . Prostate cancer Father   . Hypertension Brother   . Prostate cancer Brother     Social history:  reports that she has never smoked. She has never used smokeless tobacco. She reports that she does not drink alcohol and does not use drugs.  Medications:  Prior to Admission medications   Medication Sig Start Date End Date Taking? Authorizing Provider  acetaminophen (TYLENOL) 325 MG tablet Take 650 mg by mouth every 6 (six) hours as needed for moderate pain or headache.     [provider]  allopurinol (ZYLOPRIM) 100 MG tablet Take 50 mg by mouth daily.    [provider]  amLODipine (NORVASC) 10 MG tablet TAKE 1 TABLET BY MOUTH EVERY DAY Patient taking differently: Take 10 mg by mouth daily. 11/11/19   Patwardhan, Reynold Bowen, MD  aspirin EC 81 MG tablet Take 1 tablet (81 mg total) by mouth daily. Swallow whole. 02/17/20 02/16/21  Riesa Pope, MD  brimonidine (ALPHAGAN) 0.2 % ophthalmic solution Place into the left eye once. 04/19/19   [provider]  calcitRIOL (ROCALTROL) 0.25 MCG capsule Take 0.25 mcg by mouth daily.     [provider]  cetirizine (ZYRTEC) 10 MG tablet Take 10 mg by mouth daily as needed for allergies.     [provider]  folic acid (FOLVITE) 1 MG tablet Take 1 mg by mouth daily. 12/07/12   [provider]  HUMALOG KWIKPEN 100 UNIT/ML KwikPen Inject 2-8 Units into the skin 3 (three) times daily before meals.  05/28/18   [provider]  hydrALAZINE (APRESOLINE) 50 MG tablet Take 1 tablet (50 mg total) by mouth every 8 (eight) hours. 02/26/20   Love, Ivan Anchors, PA-C  insulin glargine (LANTUS) 100 UNIT/ML injection Inject 0.12 mLs (12 Units total) into the skin at bedtime. 02/26/20   Love, Ivan Anchors, PA-C  isosorbide mononitrate (IMDUR) 30 MG 24 hr tablet Take 1 tablet (30 mg total) by mouth daily. 02/27/20   Love, Ivan Anchors, PA-C  labetalol (NORMODYNE) 200 MG tablet Take 1 tablet (200 mg total) by mouth 2 (two) times daily. 02/26/20   Love, Ivan Anchors, PA-C  nitroGLYCERIN (NITROSTAT) 0.4 MG SL tablet PLACE  1 TABLET (0.4 MG TOTAL) UNDER THE TONGUE EVERY 5 MINUTES X 3 DOSES AS NEEDED FOR CHEST PAIN. Patient taking differently: Place 0.4 mg under the tongue every 5 (five) minutes as needed for chest pain. 01/03/19   Miquel Dunn, NP  ONE TOUCH ULTRA TEST test strip 1 each by Other route 3 (three) times daily between meals. 12/18/12   [provider]  polyethylene glycol (MIRALAX / GLYCOLAX) 17 g packet Take 17 g by mouth daily. 02/27/20   Love, Ivan Anchors, PA-C  polyvinyl alcohol (LIQUIFILM TEARS) 1.4 % ophthalmic solution Place 1 drop into both eyes 3 (three) times daily as needed for dry eyes.    [provider]  potassium chloride (KLOR-CON) 10 MEQ tablet Take 1 tablet (10 mEq total) by mouth daily. 02/27/20   Love, Ivan Anchors, PA-C  rosuvastatin (CRESTOR) 40 MG tablet Take 1 tablet (40 mg total) by mouth daily. 02/26/20   Love, Ivan Anchors, PA-C  senna-docusate (SENOKOT-S) 8.6-50 MG tablet Take 2 tablets by mouth 2 (two) times daily. 02/26/20   Love, Ivan Anchors, PA-C  torsemide (DEMADEX) 20 MG tablet Take 2 tablets (40 mg total) by mouth daily. 02/26/20   Love, Ivan Anchors, PA-C   venlafaxine XR (EFFEXOR-XR) 150 MG 24 hr capsule Take 1 capsule (150 mg total) by mouth daily. 02/27/20   Love, Ivan Anchors, PA-C     No Known Allergies  ROS:  Out of a complete 14 system review of symptoms, the patient complains only of the following symptoms, and all other reviewed systems are negative.  Walking difficulty Headache Numbness  Blood pressure (!) 145/59, pulse 60, height '5\' 3"'$  (1.6 m), weight 163 lb 3.2 oz (74 kg).  Physical Exam  General: The patient is alert and cooperative at the time of the examination.  Eyes: Pupils are equal, round, and reactive to light. Discs are flat bilaterally.  Neck: The neck is supple, cardiac murmur radiation to the carotids is noted bilaterally.  Respiratory: The respiratory examination is clear.  Cardiovascular: The cardiovascular examination reveals a regular rate and rhythm, a grade III/VI systolic ejection murmur is noted in the aortic area maximally.  Skin: Extremities are without significant edema.  Neurologic Exam  Mental status: The patient is alert and oriented x 3 at the time of the examination. The patient has apparent normal recent and remote memory, with an apparently normal attention span and concentration ability.  Cranial nerves: Facial symmetry is present. There is some decrease in pinprick sensation on the left face as compared to the right. The strength of the facial muscles and the muscles to head turning and shoulder shrug are normal bilaterally. Speech is well enunciated, no aphasia or dysarthria is noted. Extraocular movements are full. Visual fields are notable for a left homonymous visual field deficit. The tongue is midline, and the patient has symmetric elevation of the soft palate. No obvious hearing deficits are noted.  Motor: The motor testing reveals 5 over 5 strength of all 4 extremities. Good symmetric motor tone is noted throughout.  Sensory: Sensory testing is intact to pinprick, soft touch,  vibration sensation, and position sense on all 4 extremities, with exception some decreased pinprick sensation on the left legs compared to the right and a slight decrease in vibration sensation on the left leg as compared to the right. No evidence of extinction is noted.  Coordination: Cerebellar testing reveals good Howley-nose-Dupras and heel-to-shin bilaterally.  Gait and station: Gait is slightly wide-based, the patient is able  to walk independently but usually uses a walker.  Tandem gait was not attempted.  Romberg is negative but is slightly unsteady.  Reflexes: Deep tendon reflexes are symmetric and normal in the arms, but elevated in the left legs compared to the right. Toes are downgoing bilaterally.   MRI brain 02/12/20:  IMPRESSION: Interval development of two foci of acute parenchymal hemorrhage within the paramedian mid to posterior right frontal lobe, the largest measuring 1.2 cm with mild surrounding vasogenic edema.  Two unchanged 4 mm acute/early subacute infarcts within the posterior right frontal lobe, one involving the motor strip.  Otherwise stable non-contrast MRI appearance of the brain.  Chronic cortically based infarcts within the right frontal and occipital lobes.  Background mild generalized atrophy of the brain and chronic small vessel ischemic disease.  Small chronic left cerebellar infarct.   MRA head 02/11/20:  IMPRESSION: 1. Interval development of 5 mm area of acute infarct in the right frontal parietal cortex over the convexity. This appears to be in the motor cortex. FLAIR imaging normal in this region. 2. Chronic infarct right frontal and right occipital lobe unchanged. 3. MRA demonstrates right M1 stent with loss of flow in the right middle cerebral artery unchanged from 2020 MRA. Possible occlusion versus artifact. 4. Bilateral PCA stenosis.  No change from 2020   * MRI scan images were reviewed online. I agree with the written  report.   Echocardiogram 12/23/2019:   Left ventricle cavity is normal in size. Moderate concentric hypertrophy  of the left ventricle. Normal global wall motion. Normal LV systolic  function with EF 56%. Normal diastolic filling pattern.  Left atrial cavity is mildly dilated.  Well seated Wende Crease Sapien 3 bioprosthetic aortic valve with  normal functioning. Mean PG 11 mmHg. No valvular regurgitation or  paravalvular leak.  Mild (Grade I) mitral regurgitation.  Mild tricuspid regurgitation. Estimated pulmonary artery systolic pressure  31 mmHg.  Mild pulmonic regurgitation.  No significant change compared to previous study on 12/11/2018.    Assessment/Plan:  1.  Recent right frontal stroke  2.  Hypertension  3.  Dyslipidemia  4.  History of sleep apnea, untreated  5.  Diabetes  6.  Loop recorder placement  The patient has multiple risk factors for stroke.  Her LDL is extremely elevated, this will need to be followed over time.  She is running blood pressures in the 140s today, this is still too high for someone with diabetes and the systolic blood pressure should be less than 130.  She is checking her blood pressures at home, if this continues to run in this range, adjustments in her blood pressure medications will need to be done.  The patient will continue occupational and physical therapy.  She does not operate a motor vehicle secondary to her left homonymous visual field changes.  She will continue on aspirin for now.  She will follow up here in 4 months.  I will make a referral for sleep evaluation to determine whether she has significant sleep apnea that should be treated as this is another risk factor for recurrent stroke.  [name] MD 04/01/2020 1:34 PM  [facility] [address] [place], [place] [address]  Phone [phone] Fax [phone]

## 2020-04-16 ENCOUNTER — Other Ambulatory Visit: Payer: Self-pay | Admitting: Physical Medicine and Rehabilitation

## 2020-04-17 ENCOUNTER — Other Ambulatory Visit: Payer: Self-pay | Admitting: Cardiology

## 2020-04-17 DIAGNOSIS — I10 Essential (primary) hypertension: Secondary | ICD-10-CM

## 2020-04-24 ENCOUNTER — Ambulatory Visit (INDEPENDENT_AMBULATORY_CARE_PROVIDER_SITE_OTHER): Payer: Medicare PPO

## 2020-04-24 DIAGNOSIS — I639 Cerebral infarction, unspecified: Secondary | ICD-10-CM | POA: Diagnosis not present

## 2020-04-24 LAB — CUP PACEART REMOTE DEVICE CHECK
Date Time Interrogation Session: 20220407174539
Implantable Pulse Generator Implant Date: 20220131

## 2020-05-04 ENCOUNTER — Encounter: Payer: Self-pay | Admitting: *Deleted

## 2020-05-05 ENCOUNTER — Encounter (INDEPENDENT_AMBULATORY_CARE_PROVIDER_SITE_OTHER): Payer: Self-pay

## 2020-05-06 ENCOUNTER — Encounter (INDEPENDENT_AMBULATORY_CARE_PROVIDER_SITE_OTHER): Payer: Medicare PPO | Admitting: Ophthalmology

## 2020-05-07 ENCOUNTER — Encounter: Payer: Self-pay | Admitting: *Deleted

## 2020-05-07 NOTE — Progress Notes (Signed)
[facility] Summary Report / Loop Recorder 

## 2020-05-11 ENCOUNTER — Emergency Department (HOSPITAL_BASED_OUTPATIENT_CLINIC_OR_DEPARTMENT_OTHER): Payer: Medicare PPO | Admitting: Radiology

## 2020-05-11 ENCOUNTER — Other Ambulatory Visit: Payer: Self-pay

## 2020-05-11 ENCOUNTER — Encounter (HOSPITAL_BASED_OUTPATIENT_CLINIC_OR_DEPARTMENT_OTHER): Payer: Self-pay | Admitting: Emergency Medicine

## 2020-05-11 ENCOUNTER — Inpatient Hospital Stay (HOSPITAL_BASED_OUTPATIENT_CLINIC_OR_DEPARTMENT_OTHER)
Admission: EM | Admit: 2020-05-11 | Discharge: 2020-05-15 | DRG: 291 | Disposition: A | Discharge status: Home Health | Payer: Medicare PPO | Attending: Family Medicine | Admitting: Family Medicine

## 2020-05-11 ENCOUNTER — Encounter: Payer: Self-pay | Admitting: *Deleted

## 2020-05-11 ENCOUNTER — Ambulatory Visit (INDEPENDENT_AMBULATORY_CARE_PROVIDER_SITE_OTHER): Payer: Medicare PPO | Admitting: Neurology

## 2020-05-11 ENCOUNTER — Telehealth: Payer: Self-pay | Admitting: Neurology

## 2020-05-11 ENCOUNTER — Encounter: Payer: Self-pay | Admitting: Neurology

## 2020-05-11 VITALS — BP 195/69 | HR 62 | Ht 63.0 in | Wt 176.0 lb

## 2020-05-11 DIAGNOSIS — E11319 Type 2 diabetes mellitus with unspecified diabetic retinopathy without macular edema: Secondary | ICD-10-CM | POA: Diagnosis present

## 2020-05-11 DIAGNOSIS — N189 Chronic kidney disease, unspecified: Secondary | ICD-10-CM | POA: Diagnosis present

## 2020-05-11 DIAGNOSIS — E669 Obesity, unspecified: Secondary | ICD-10-CM | POA: Diagnosis present

## 2020-05-11 DIAGNOSIS — Z6831 Body mass index (BMI) 31.0-31.9, adult: Secondary | ICD-10-CM

## 2020-05-11 DIAGNOSIS — I509 Heart failure, unspecified: Secondary | ICD-10-CM | POA: Diagnosis present

## 2020-05-11 DIAGNOSIS — T501X5A Adverse effect of loop [high-ceiling] diuretics, initial encounter: Secondary | ICD-10-CM | POA: Diagnosis not present

## 2020-05-11 DIAGNOSIS — I739 Peripheral vascular disease, unspecified: Secondary | ICD-10-CM

## 2020-05-11 DIAGNOSIS — N1832 Chronic kidney disease, stage 3b: Secondary | ICD-10-CM | POA: Diagnosis present

## 2020-05-11 DIAGNOSIS — I63431 Cerebral infarction due to embolism of right posterior cerebral artery: Secondary | ICD-10-CM | POA: Insufficient documentation

## 2020-05-11 DIAGNOSIS — Z90710 Acquired absence of both cervix and uterus: Secondary | ICD-10-CM

## 2020-05-11 DIAGNOSIS — G2581 Restless legs syndrome: Secondary | ICD-10-CM | POA: Diagnosis present

## 2020-05-11 DIAGNOSIS — F32A Depression, unspecified: Secondary | ICD-10-CM | POA: Diagnosis not present

## 2020-05-11 DIAGNOSIS — D631 Anemia in chronic kidney disease: Secondary | ICD-10-CM | POA: Diagnosis present

## 2020-05-11 DIAGNOSIS — N179 Acute kidney failure, unspecified: Secondary | ICD-10-CM | POA: Diagnosis not present

## 2020-05-11 DIAGNOSIS — E1122 Type 2 diabetes mellitus with diabetic chronic kidney disease: Secondary | ICD-10-CM | POA: Diagnosis present

## 2020-05-11 DIAGNOSIS — R0789 Other chest pain: Secondary | ICD-10-CM

## 2020-05-11 DIAGNOSIS — Z794 Long term (current) use of insulin: Secondary | ICD-10-CM | POA: Diagnosis not present

## 2020-05-11 DIAGNOSIS — N184 Chronic kidney disease, stage 4 (severe): Secondary | ICD-10-CM

## 2020-05-11 DIAGNOSIS — E785 Hyperlipidemia, unspecified: Secondary | ICD-10-CM | POA: Diagnosis present

## 2020-05-11 DIAGNOSIS — R0602 Shortness of breath: Secondary | ICD-10-CM

## 2020-05-11 DIAGNOSIS — N2581 Secondary hyperparathyroidism of renal origin: Secondary | ICD-10-CM | POA: Diagnosis not present

## 2020-05-11 DIAGNOSIS — I160 Hypertensive urgency: Secondary | ICD-10-CM | POA: Diagnosis present

## 2020-05-11 DIAGNOSIS — I63421 Cerebral infarction due to embolism of right anterior cerebral artery: Secondary | ICD-10-CM

## 2020-05-11 DIAGNOSIS — E113512 Type 2 diabetes mellitus with proliferative diabetic retinopathy with macular edema, left eye: Secondary | ICD-10-CM

## 2020-05-11 DIAGNOSIS — Z955 Presence of coronary angioplasty implant and graft: Secondary | ICD-10-CM

## 2020-05-11 DIAGNOSIS — I130 Hypertensive heart and chronic kidney disease with heart failure and stage 1 through stage 4 chronic kidney disease, or unspecified chronic kidney disease: Principal | ICD-10-CM | POA: Diagnosis present

## 2020-05-11 DIAGNOSIS — I10 Essential (primary) hypertension: Secondary | ICD-10-CM

## 2020-05-11 DIAGNOSIS — I2510 Atherosclerotic heart disease of native coronary artery without angina pectoris: Secondary | ICD-10-CM | POA: Diagnosis present

## 2020-05-11 DIAGNOSIS — Z952 Presence of prosthetic heart valve: Secondary | ICD-10-CM

## 2020-05-11 DIAGNOSIS — I1310 Hypertensive heart and chronic kidney disease without heart failure, with stage 1 through stage 4 chronic kidney disease, or unspecified chronic kidney disease: Secondary | ICD-10-CM | POA: Diagnosis present

## 2020-05-11 DIAGNOSIS — M069 Rheumatoid arthritis, unspecified: Secondary | ICD-10-CM | POA: Diagnosis not present

## 2020-05-11 DIAGNOSIS — Z7982 Long term (current) use of aspirin: Secondary | ICD-10-CM | POA: Diagnosis not present

## 2020-05-11 DIAGNOSIS — Z20822 Contact with and (suspected) exposure to covid-19: Secondary | ICD-10-CM | POA: Diagnosis not present

## 2020-05-11 DIAGNOSIS — I5043 Acute on chronic combined systolic (congestive) and diastolic (congestive) heart failure: Secondary | ICD-10-CM | POA: Diagnosis not present

## 2020-05-11 DIAGNOSIS — Z79899 Other long term (current) drug therapy: Secondary | ICD-10-CM

## 2020-05-11 DIAGNOSIS — E1142 Type 2 diabetes mellitus with diabetic polyneuropathy: Secondary | ICD-10-CM | POA: Diagnosis present

## 2020-05-11 DIAGNOSIS — E113551 Type 2 diabetes mellitus with stable proliferative diabetic retinopathy, right eye: Secondary | ICD-10-CM | POA: Diagnosis not present

## 2020-05-11 DIAGNOSIS — Z8673 Personal history of transient ischemic attack (TIA), and cerebral infarction without residual deficits: Secondary | ICD-10-CM | POA: Diagnosis not present

## 2020-05-11 DIAGNOSIS — I639 Cerebral infarction, unspecified: Secondary | ICD-10-CM

## 2020-05-11 DIAGNOSIS — K219 Gastro-esophageal reflux disease without esophagitis: Secondary | ICD-10-CM | POA: Diagnosis not present

## 2020-05-11 LAB — TROPONIN I (HIGH SENSITIVITY)
Troponin I (High Sensitivity): 23 ng/L — ABNORMAL HIGH (ref <18)
Troponin I (High Sensitivity): 27 ng/L — ABNORMAL HIGH (ref <18)

## 2020-05-11 LAB — HEMOGLOBIN A1C
Hgb A1c MFr Bld: 7.3 % — ABNORMAL HIGH (ref 4.8–5.6)
Mean Plasma Glucose: 162.81 mg/dL

## 2020-05-11 LAB — BRAIN NATRIURETIC PEPTIDE: B Natriuretic Peptide: 302.7 pg/mL — ABNORMAL HIGH (ref 0.0–100.0)

## 2020-05-11 LAB — COMPREHENSIVE METABOLIC PANEL
ALT: 19 U/L (ref 0–44)
AST: 27 U/L (ref 15–41)
Albumin: 3.7 g/dL (ref 3.5–5.0)
Alkaline Phosphatase: 72 U/L (ref 38–126)
Anion gap: 9 (ref 5–15)
BUN: 42 mg/dL — ABNORMAL HIGH (ref 8–23)
CO2: 24 mmol/L (ref 22–32)
Calcium: 9.3 mg/dL (ref 8.9–10.3)
Chloride: 105 mmol/L (ref 98–111)
Creatinine, Ser: 2.82 mg/dL — ABNORMAL HIGH (ref 0.44–1.00)
GFR, Estimated: 16 mL/min — ABNORMAL LOW (ref >60)
Glucose, Bld: 143 mg/dL — ABNORMAL HIGH (ref 70–99)
Potassium: 4.4 mmol/L (ref 3.5–5.1)
Sodium: 138 mmol/L (ref 135–145)
Total Bilirubin: 0.4 mg/dL (ref 0.3–1.2)
Total Protein: 6.3 g/dL — ABNORMAL LOW (ref 6.5–8.1)

## 2020-05-11 LAB — RESP PANEL BY RT-PCR (FLU A&B, COVID) ARPGX2
Influenza A by PCR: NEGATIVE
Influenza B by PCR: NEGATIVE
SARS Coronavirus 2 by RT PCR: NEGATIVE

## 2020-05-11 LAB — CBC
HCT: 31.7 % — ABNORMAL LOW (ref 36.0–46.0)
Hemoglobin: 10.2 g/dL — ABNORMAL LOW (ref 12.0–15.0)
MCH: 28.7 pg (ref 26.0–34.0)
MCHC: 32.2 g/dL (ref 30.0–36.0)
MCV: 89.3 fL (ref 80.0–100.0)
Platelets: 239 10*3/uL (ref 150–400)
RBC: 3.55 MIL/uL — ABNORMAL LOW (ref 3.87–5.11)
RDW: 14.4 % (ref 11.5–15.5)
WBC: 9.8 10*3/uL (ref 4.0–10.5)
nRBC: 0 % (ref 0.0–0.2)

## 2020-05-11 LAB — TSH: TSH: 3.147 u[IU]/mL (ref 0.350–4.500)

## 2020-05-11 LAB — GLUCOSE, CAPILLARY: Glucose-Capillary: 152 mg/dL — ABNORMAL HIGH (ref 70–99)

## 2020-05-11 MED ORDER — INSULIN GLARGINE 100 UNIT/ML ~~LOC~~ SOLN
12.0000 [IU] | Freq: Every day | SUBCUTANEOUS | Status: DC
  Administered 2020-05-11 – 2020-05-14 (×4): 12 [IU] via SUBCUTANEOUS
  Filled 2020-05-11 (×5): qty 0.12

## 2020-05-11 MED ORDER — BRIMONIDINE TARTRATE 0.2 % OP SOLN: 1.0000 [drp] | Freq: Once | OPHTHALMIC | Status: DC

## 2020-05-11 MED ORDER — ALLOPURINOL 100 MG PO TABS
50.0000 mg | ORAL_TABLET | Freq: Every day | ORAL | Status: DC
  Administered 2020-05-12 – 2020-05-15 (×4): 50 mg via ORAL
  Filled 2020-05-11 (×4): qty 1

## 2020-05-11 MED ORDER — FUROSEMIDE 10 MG/ML IJ SOLN
80.0000 mg | Freq: Once | INTRAMUSCULAR | Status: AC
  Administered 2020-05-11: 80 mg via INTRAVENOUS
  Filled 2020-05-11: qty 8

## 2020-05-11 MED ORDER — POLYVINYL ALCOHOL 1.4 % OP SOLN
1.0000 [drp] | Freq: Three times a day (TID) | OPHTHALMIC | Status: DC | PRN
  Filled 2020-05-11: qty 15

## 2020-05-11 MED ORDER — HYDRALAZINE HCL 50 MG PO TABS
100.0000 mg | ORAL_TABLET | Freq: Three times a day (TID) | ORAL | Status: DC
  Administered 2020-05-11 – 2020-05-12 (×2): 100 mg via ORAL
  Filled 2020-05-11 (×2): qty 2

## 2020-05-11 MED ORDER — POLYETHYLENE GLYCOL 3350 17 G PO PACK
17.0000 g | PACK | Freq: Every day | ORAL | Status: DC
  Administered 2020-05-11 – 2020-05-15 (×4): 17 g via ORAL
  Filled 2020-05-11 (×5): qty 1

## 2020-05-11 MED ORDER — ACETAMINOPHEN 325 MG PO TABS: 650.0000 mg | ORAL_TABLET | ORAL | Status: DC | PRN

## 2020-05-11 MED ORDER — ISOSORBIDE MONONITRATE ER 60 MG PO TB24
60.0000 mg | ORAL_TABLET | Freq: Every day | ORAL | Status: DC
  Administered 2020-05-12 – 2020-05-15 (×4): 60 mg via ORAL
  Filled 2020-05-11 (×3): qty 1

## 2020-05-11 MED ORDER — FUROSEMIDE 10 MG/ML IJ SOLN: 40.0000 mg | Freq: Every day | INTRAMUSCULAR | Status: DC

## 2020-05-11 MED ORDER — SENNOSIDES-DOCUSATE SODIUM 8.6-50 MG PO TABS
2.0000 | ORAL_TABLET | Freq: Two times a day (BID) | ORAL | Status: DC
  Administered 2020-05-11 – 2020-05-15 (×7): 2 via ORAL
  Filled 2020-05-11 (×7): qty 2

## 2020-05-11 MED ORDER — SODIUM CHLORIDE 0.9% FLUSH: 3.0000 mL | INTRAVENOUS | Status: DC | PRN

## 2020-05-11 MED ORDER — SODIUM CHLORIDE 0.9 % IV SOLN: 250.0000 mL | INTRAVENOUS | Status: DC | PRN

## 2020-05-11 MED ORDER — POTASSIUM CHLORIDE CRYS ER 10 MEQ PO TBCR
10.0000 meq | EXTENDED_RELEASE_TABLET | Freq: Every day | ORAL | Status: DC
  Administered 2020-05-12 – 2020-05-13 (×2): 10 meq via ORAL
  Filled 2020-05-11 (×2): qty 1

## 2020-05-11 MED ORDER — LORATADINE 10 MG PO TABS
10.0000 mg | ORAL_TABLET | Freq: Every day | ORAL | Status: DC
  Administered 2020-05-11 – 2020-05-13 (×3): 10 mg via ORAL
  Filled 2020-05-11 (×3): qty 1

## 2020-05-11 MED ORDER — ROSUVASTATIN CALCIUM 20 MG PO TABS
40.0000 mg | ORAL_TABLET | Freq: Every day | ORAL | Status: DC
  Administered 2020-05-12 – 2020-05-15 (×4): 40 mg via ORAL
  Filled 2020-05-11 (×4): qty 2

## 2020-05-11 MED ORDER — HEPARIN SODIUM (PORCINE) 5000 UNIT/ML IJ SOLN
5000.0000 [IU] | Freq: Two times a day (BID) | INTRAMUSCULAR | Status: DC
  Administered 2020-05-11 – 2020-05-15 (×8): 5000 [IU] via SUBCUTANEOUS
  Filled 2020-05-11 (×8): qty 1

## 2020-05-11 MED ORDER — INSULIN ASPART 100 UNIT/ML ~~LOC~~ SOLN
0.0000 [IU] | Freq: Three times a day (TID) | SUBCUTANEOUS | Status: DC
  Administered 2020-05-12: 2 [IU] via SUBCUTANEOUS
  Administered 2020-05-12: 1 [IU] via SUBCUTANEOUS
  Administered 2020-05-12: 3 [IU] via SUBCUTANEOUS
  Administered 2020-05-13 – 2020-05-14 (×3): 1 [IU] via SUBCUTANEOUS

## 2020-05-11 MED ORDER — VENLAFAXINE HCL ER 150 MG PO CP24
150.0000 mg | ORAL_CAPSULE | Freq: Every day | ORAL | Status: DC
  Administered 2020-05-12 – 2020-05-15 (×4): 150 mg via ORAL
  Filled 2020-05-11 (×4): qty 1

## 2020-05-11 MED ORDER — AMLODIPINE BESYLATE 10 MG PO TABS
10.0000 mg | ORAL_TABLET | Freq: Every day | ORAL | Status: DC
  Administered 2020-05-11 – 2020-05-15 (×5): 10 mg via ORAL
  Filled 2020-05-11 (×5): qty 1

## 2020-05-11 MED ORDER — BRIMONIDINE TARTRATE 0.2 % OP SOLN
1.0000 [drp] | Freq: Two times a day (BID) | OPHTHALMIC | Status: DC
  Administered 2020-05-12 – 2020-05-15 (×7): 1 [drp] via OPHTHALMIC
  Filled 2020-05-11: qty 5

## 2020-05-11 MED ORDER — CALCITRIOL 0.25 MCG PO CAPS
0.2500 ug | ORAL_CAPSULE | Freq: Every day | ORAL | Status: DC
  Administered 2020-05-12 – 2020-05-15 (×4): 0.25 ug via ORAL
  Filled 2020-05-11 (×4): qty 1

## 2020-05-11 MED ORDER — LABETALOL HCL 200 MG PO TABS
200.0000 mg | ORAL_TABLET | Freq: Two times a day (BID) | ORAL | Status: DC
  Administered 2020-05-11 – 2020-05-13 (×5): 200 mg via ORAL
  Filled 2020-05-11 (×5): qty 1

## 2020-05-11 MED ORDER — ASPIRIN EC 81 MG PO TBEC
81.0000 mg | DELAYED_RELEASE_TABLET | Freq: Every day | ORAL | Status: DC
  Administered 2020-05-12 – 2020-05-15 (×4): 81 mg via ORAL
  Filled 2020-05-11 (×4): qty 1

## 2020-05-11 MED ORDER — ACETAMINOPHEN 325 MG PO TABS: 650.0000 mg | ORAL_TABLET | Freq: Four times a day (QID) | ORAL | Status: DC | PRN

## 2020-05-11 MED ORDER — SODIUM CHLORIDE 0.9% FLUSH
3.0000 mL | Freq: Two times a day (BID) | INTRAVENOUS | Status: DC
  Administered 2020-05-11 – 2020-05-14 (×6): 3 mL via INTRAVENOUS

## 2020-05-11 MED ORDER — FOLIC ACID 1 MG PO TABS
1.0000 mg | ORAL_TABLET | Freq: Every day | ORAL | Status: DC
  Administered 2020-05-12 – 2020-05-15 (×4): 1 mg via ORAL
  Filled 2020-05-11 (×4): qty 1

## 2020-05-11 MED ORDER — ONDANSETRON HCL 4 MG/2ML IJ SOLN: 4.0000 mg | Freq: Four times a day (QID) | INTRAMUSCULAR | Status: DC | PRN

## 2020-05-11 NOTE — Telephone Encounter (Signed)
The patient was seen and evaluated by Dr. Marshall Cork, no macular edema was seen.  Some diabetic retinopathy was seen.

## 2020-05-11 NOTE — ED Provider Notes (Addendum)
[name] Provider Note   CSN: [id number] Arrival date & time: 05/11/20  1246     History Chief Complaint  Patient presents with  . Shortness of Breath    [name] is a 80 y.o. female.  Patient with hx dm, cad, presents indicating has felt sob for the past few days. Symptoms acute onset, moderate, constant, persistent, worse w exertion and lying flat. ?orthopnea. No pnd. Denies exertional or episodic chest pain but indicates has felt tight or heavy - mild, persistent, dull-heavy. Occasional non prod cough. No sore throat or runny nose. No fever or chills. No known ill contacts. +bilateral lower leg swelling. No calf pain. Compliant w home meds. Non smoker.   The history is provided by the patient.  Shortness of Breath Associated symptoms: chest pain and cough   Associated symptoms: no abdominal pain, no fever, no headaches, no neck pain, no rash, no sore throat and no vomiting        Past Medical History:  Diagnosis Date  . Arthritis   . Cerebrovascular disease   . CKD (chronic kidney disease)    Sees Dr Hollie Salk  . Coronary artery disease   . Depression   . Diabetes ([place])    INSULIN DEPENDENT  . Diabetic peripheral neuropathy ([place])   . Diabetic retinopathy ([place])   . Diverticulitis   . GERD (gastroesophageal reflux disease)   . History of CVA (cerebrovascular accident)    x 2 no residulal  . Hyperlipidemia   . Hypertension   . Obesity   . Renal lesion   . S/P TAVR (transcatheter aortic valve replacement) 11/13/2018   s/p TAVR with a 85m Edwards S3U via the TF approach by Dr. ORoxy Mannsand Dr. MAngelena Form . Severe aortic stenosis     Patient Active Problem List   Diagnosis Date Noted  . Cerebrovascular accident (CVA) due to embolism of right posterior cerebral artery ([place] 05/11/2020  . Stable treated proliferative diabetic retinopathy of right eye without macular edema determined by examination associated with type 2 diabetes mellitus  ([place] 03/02/2020  . Posterior vitreous detachment of right eye 03/02/2020  . Anemia 02/28/2020  . Acute on chronic renal failure ([place] 02/28/2020  . Hypokalemia   . Diabetic peripheral neuropathy ([place]   . Benign essential HTN   . Cerebral infarction due to embolism of right anterior cerebral artery ([place] 02/19/2020  . Ischemic stroke of frontal lobe ([place] 02/17/2020  . Cerebral embolism with cerebral infarction 02/12/2020  . Thrombocytopenia ([place] 02/10/2020  . Acute left-sided weakness 02/09/2020  . Weakness 02/08/2020  . Chronic heart failure with preserved ejection fraction ([place] 01/02/2020  . Atypical chest pain 05/31/2019  . Central retinal vein occlusion, left eye, stable 05/29/2019  . Central retinal vein occlusion of left eye 05/28/2019  . Proliferative diabetic retinopathy of left eye with macular edema associated with type 2 diabetes mellitus ([place] 05/28/2019  . Acute on chronic diastolic heart failure ([place] 11/13/2018  . S/P TAVR (transcatheter aortic valve replacement) 11/13/2018  . Hypertension   . Hyperlipidemia   . Renal lesion   . Severe aortic stenosis 10/23/2018  . Chronic kidney disease (CKD), stage IV (severe) ([place] 12/05/2017  . Coronary artery disease of native artery of native heart with stable angina pectoris ([place] 01/07/2014  . S/P PTCA (percutaneous transluminal coronary angioplasty) 01/07/2014  . History of ischemic anterior cerebral artery stroke 01/30/2013    Past Surgical History:  Procedure Laterality Date  . ABDOMINAL HYSTERECTOMY    .  AV FISTULA PLACEMENT Left 12/18/2017   Procedure: ARTERIOVENOUS (AV) FISTULA CREATION ARM;  Surgeon: Marty Heck, MD;  Location: [facility];  Service: Vascular;  Laterality: Left;  . bilateral cataract surgery    . CARDIAC CATHETERIZATION  01/07/2014   DR [place] RESECTION  02/1999  . COLONOSCOPY    . COLOSTOMY  02/1999  . COLOSTOMY CLOSURE  07/1999  . EYE SURGERY Left 2019  . FISTULA  SUPERFICIALIZATION Left 06/29/2018   Procedure: FISTULA SUPERFICIALIZATION LEFT BRACHIOCEPHALIC;  Surgeon: Marty Heck, MD;  Location: [facility];  Service: Vascular;  Laterality: Left;  . LEFT HEART CATHETERIZATION WITH CORONARY ANGIOGRAM N/A 01/07/2014   Procedure: LEFT HEART CATHETERIZATION WITH CORONARY ANGIOGRAM;  Surgeon: Laverda Page, MD;  Location: [facility] CATH LAB;  Service: Cardiovascular;  Laterality: N/A;  . LOOP RECORDER INSERTION N/A 02/17/2020   Procedure: LOOP RECORDER INSERTION;  Surgeon: Deboraha Sprang, MD;  Location: [place] [facility] CV LAB;  Service: Cardiovascular;  Laterality: N/A;  . middle cerebral artery stent placement Right   . OTHER SURGICAL HISTORY     laser surgery  . PTCA  01/07/2014   DES to RCA    DR Einar Gip  . right knee surgery Right    for infection  . RIGHT/LEFT HEART CATH AND CORONARY ANGIOGRAPHY N/A 10/23/2018   Procedure: RIGHT/LEFT HEART CATH AND CORONARY ANGIOGRAPHY;  Surgeon: Nigel Mormon, MD;  Location: [facility] CV LAB;  Service: Cardiovascular;  Laterality: N/A;  . TEE WITHOUT CARDIOVERSION N/A 11/13/2018   Procedure: TRANSESOPHAGEAL ECHOCARDIOGRAM (TEE);  Surgeon: Burnell Blanks, MD;  Location: [facility];  Service: Open Heart Surgery;  Laterality: N/A;  . TRANSCATHETER AORTIC VALVE REPLACEMENT, TRANSFEMORAL  11/13/2018  . TRANSCATHETER AORTIC VALVE REPLACEMENT, TRANSFEMORAL N/A 11/13/2018   Procedure: TRANSCATHETER AORTIC VALVE REPLACEMENT, TRANSFEMORAL;  Surgeon: Burnell Blanks, MD;  Location: [facility];  Service: Open Heart Surgery;  Laterality: N/A;     OB History   No obstetric history on file.     Family History  Problem Relation Age of Onset  . Diabetes Mother   . Heart disease Mother   . Prostate cancer Father   . Hypertension Brother   . Prostate cancer Brother     Social History   Tobacco Use  . Smoking status: Never Smoker  . Smokeless tobacco: Never Used  Vaping Use  . Vaping Use: Never used  Substance  Use Topics  . Alcohol use: No  . Drug use: No    Home Medications Prior to Admission medications   Medication Sig Start Date End Date Taking? Authorizing Provider  acetaminophen (TYLENOL) 325 MG tablet Take 650 mg by mouth every 6 (six) hours as needed for moderate pain or headache.     [provider]  allopurinol (ZYLOPRIM) 100 MG tablet Take 50 mg by mouth daily.    [provider]  amLODipine (NORVASC) 10 MG tablet TAKE 1 TABLET BY MOUTH EVERY DAY Patient taking differently: Take 10 mg by mouth daily. 11/11/19   Patwardhan, Reynold Bowen, MD  aspirin EC 81 MG tablet Take 1 tablet (81 mg total) by mouth daily. Swallow whole. 02/17/20 02/16/21  Riesa Pope, MD  brimonidine (ALPHAGAN) 0.2 % ophthalmic solution Place into the left eye once. 04/19/19   [provider]  calcitRIOL (ROCALTROL) 0.25 MCG capsule Take 0.25 mcg by mouth daily.     [provider]  cetirizine (ZYRTEC) 10 MG tablet Take 10 mg by mouth daily as needed for allergies.  [provider]  folic acid (FOLVITE) 1 MG tablet Take 1 mg by mouth daily. 12/07/12   [provider]  HUMALOG KWIKPEN 100 UNIT/ML KwikPen Inject 2-8 Units into the skin 3 (three) times daily before meals.  05/28/18   [provider]  hydrALAZINE (APRESOLINE) 50 MG tablet Take 1 tablet (50 mg total) by mouth every 8 (eight) hours. 02/26/20   Love, Ivan Anchors, PA-C  insulin glargine (LANTUS) 100 UNIT/ML injection Inject 0.12 mLs (12 Units total) into the skin at bedtime. 02/26/20   Love, Ivan Anchors, PA-C  isosorbide mononitrate (IMDUR) 30 MG 24 hr tablet Take 1 tablet (30 mg total) by mouth daily. 02/27/20   Love, Ivan Anchors, PA-C  labetalol (NORMODYNE) 200 MG tablet Take 1 tablet (200 mg total) by mouth 2 (two) times daily. 02/26/20   Love, Ivan Anchors, PA-C  nitroGLYCERIN (NITROSTAT) 0.4 MG SL tablet PLACE 1 TABLET (0.4 MG TOTAL) UNDER THE TONGUE EVERY 5 MINUTES X 3 DOSES AS NEEDED FOR CHEST PAIN. Patient  taking differently: Place 0.4 mg under the tongue every 5 (five) minutes as needed for chest pain. 01/03/19   Miquel Dunn, NP  ONE TOUCH ULTRA TEST test strip 1 each by Other route 3 (three) times daily between meals. 12/18/12   [provider]  polyethylene glycol (MIRALAX / GLYCOLAX) 17 g packet Take 17 g by mouth daily. 02/27/20   Love, Ivan Anchors, PA-C  polyvinyl alcohol (LIQUIFILM TEARS) 1.4 % ophthalmic solution Place 1 drop into both eyes 3 (three) times daily as needed for dry eyes.    [provider]  potassium chloride (KLOR-CON) 10 MEQ tablet Take 1 tablet (10 mEq total) by mouth daily. 02/27/20   Love, Ivan Anchors, PA-C  rosuvastatin (CRESTOR) 40 MG tablet Take 1 tablet (40 mg total) by mouth daily. 02/26/20   Love, Ivan Anchors, PA-C  senna-docusate (SENOKOT-S) 8.6-50 MG tablet Take 2 tablets by mouth 2 (two) times daily. 02/26/20   Love, Ivan Anchors, PA-C  torsemide (DEMADEX) 20 MG tablet Take 2 tablets (40 mg total) by mouth daily. 02/26/20   Love, Ivan Anchors, PA-C  venlafaxine XR (EFFEXOR-XR) 150 MG 24 hr capsule Take 1 capsule (150 mg total) by mouth daily. 02/27/20   Bary Leriche, PA-C    Allergies    Patient has no known allergies.  Review of Systems   Review of Systems  Constitutional: Negative for chills and fever.  HENT: Negative for sore throat.   Eyes: Negative for redness.  Respiratory: Positive for cough and shortness of breath.   Cardiovascular: Positive for chest pain and leg swelling. Negative for palpitations.  Gastrointestinal: Negative for abdominal pain, diarrhea and vomiting.  Genitourinary: Negative for flank pain.  Musculoskeletal: Negative for back pain and neck pain.  Skin: Negative for rash.  Neurological: Negative for headaches.  Hematological: Does not bruise/bleed easily.  Psychiatric/Behavioral: Negative for confusion.    Physical Exam Updated Vital Signs BP (!) 178/67 (BP Location: Right Arm)   Pulse (!) 57   Resp 20   Ht 1.6 m (5'  3")   Wt 79.8 kg   SpO2 100%   BMI 31.18 kg/m   Physical Exam Vitals and nursing note reviewed.  Constitutional:      Appearance: Normal appearance. She is well-developed.  HENT:     Head: Atraumatic.     Nose: Nose normal.     Mouth/Throat:     Mouth: Mucous membranes are moist.  Eyes:  General: No scleral icterus.    Conjunctiva/sclera: Conjunctivae normal.  Neck:     Trachea: No tracheal deviation.  Cardiovascular:     Rate and Rhythm: Normal rate and regular rhythm.     Pulses: Normal pulses.     Heart sounds: Normal heart sounds. No murmur heard. No friction rub. No gallop.   Pulmonary:     Effort: Pulmonary effort is normal. No respiratory distress.     Breath sounds: Normal breath sounds.  Abdominal:     General: Bowel sounds are normal. There is no distension.     Palpations: Abdomen is soft.     Tenderness: There is no abdominal tenderness.  Genitourinary:    Comments: No cva tenderness.  Musculoskeletal:     Cervical back: Normal range of motion and neck supple. No rigidity. No muscular tenderness.     Comments: Mild symmetric bilateral ankle and lower leg edema.   Skin:    General: Skin is warm and dry.     Findings: No rash.  Neurological:     Mental Status: She is alert.     Comments: Alert, speech normal.   Psychiatric:        Mood and Affect: Mood normal.     ED Results / Procedures / Treatments   Labs (all labs ordered are listed, but only abnormal results are displayed) Results for orders placed or performed during the hospital encounter of 05/11/20  CBC  Result Value Ref Range   WBC 9.8 4.0 - 10.5 K/uL   RBC 3.55 (L) 3.87 - 5.11 MIL/uL   Hemoglobin 10.2 (L) 12.0 - 15.0 g/dL   HCT 31.7 (L) 36.0 - 46.0 %   MCV 89.3 80.0 - 100.0 fL   MCH 28.7 26.0 - 34.0 pg   MCHC 32.2 30.0 - 36.0 g/dL   RDW 14.4 11.5 - 15.5 %   Platelets 239 150 - 400 K/uL   nRBC 0.0 0.0 - 0.2 %  Comprehensive metabolic panel  Result Value Ref Range   Sodium 138 135  - 145 mmol/L   Potassium 4.4 3.5 - 5.1 mmol/L   Chloride 105 98 - 111 mmol/L   CO2 24 22 - 32 mmol/L   Glucose, Bld 143 (H) 70 - 99 mg/dL   BUN 42 (H) 8 - 23 mg/dL   Creatinine, Ser 2.82 (H) 0.44 - 1.00 mg/dL   Calcium 9.3 8.9 - 10.3 mg/dL   Total Protein 6.3 (L) 6.5 - 8.1 g/dL   Albumin 3.7 3.5 - 5.0 g/dL   AST 27 15 - 41 U/L   ALT 19 0 - 44 U/L   Alkaline Phosphatase 72 38 - 126 U/L   Total Bilirubin 0.4 0.3 - 1.2 mg/dL   GFR, Estimated 16 (L) >60 mL/min   Anion gap 9 5 - 15  Brain natriuretic peptide  Result Value Ref Range   B Natriuretic Peptide 302.7 (H) 0.0 - 100.0 pg/mL  Troponin I (High Sensitivity)  Result Value Ref Range   Troponin I (High Sensitivity) 23 (H) <18 ng/L   CUP PACEART REMOTE DEVICE CHECK  Result Date: 04/24/2020 ILR summary report received. Battery status OK. Normal device function. No new symptom, tachy, brady, or pause episodes. No new AF episodes. Monthly summary reports and ROV/PRN.  R. Powers, CVRS   EKG EKG Interpretation  Date/Time:  Monday May 11 2020 12:58:48 EDT Ventricular Rate:  58 PR Interval:  210 QRS Duration: 88 QT Interval:  414 QTC Calculation: 406 R Axis:  16 Text Interpretation: Sinus bradycardia with 1st degree A-V block Low voltage QRS Non-specific ST-t changes Confirmed by Lajean Saver [id number]) on 05/11/2020 1:15:08 PM   Radiology DG Chest 2 View  Result Date: 05/11/2020 CLINICAL DATA:  Shortness of breath EXAM: CHEST - 2 VIEW COMPARISON:  April 23, 2019 FINDINGS: There is cardiomegaly with pulmonary venous hypertension. There is a small right pleural effusion. No edema or airspace opacity. Patient is status post aortic valve replacement. A loop recorder is present on the left anteriorly. No adenopathy. Degenerative change noted in the thoracic spine. There is aortic atherosclerosis. IMPRESSION: Cardiomegaly with pulmonary vascular congestion. Small right pleural effusion. No edema or airspace opacity. Status post aortic  valve replacement. Loop recorder on the left. Aortic Atherosclerosis (ICD10-I70.0). Electronically Signed   By: Lowella Grip III [name]   On: 05/11/2020 14:20    Procedures Procedures   Medications Ordered in ED Medications - No data to display  ED Course  I have reviewed the triage vital signs and the nursing notes.  Pertinent labs & imaging results that were available during my care of the patient were reviewed by me and considered in my medical decision making (see chart for details).    MDM Rules/Calculators/A&P                         Iv ns. Continuous pulse ox and cardiac monitoring. Stat labs and imaging.   Reviewed nursing notes and prior charts for additional history.   Labs reviewed/interpreted by me - initial trop sl elevated. CRI.   CXR reviewed/interpreted by me - vascular congestion ?mild chf.   Lasix iv.   Given dyspnea, chest tightness, acute on chronic chf, hx cad - will admit.  Hospitalists consulted for admission. Discussed pt with Dr Roosevelt Locks - will admit to [facility] swab added to labs.    Final Clinical Impression(s) / ED Diagnoses Final diagnoses:  None    Rx / DC Orders ED Discharge Orders    None           Lajean Saver, MD 05/11/20 [phone]

## 2020-05-11 NOTE — ED Triage Notes (Signed)
Pt via pov from home with sob and difficulty breathing since Friday. Pt has hx of aorta replacement, kidney problems, diabetes. Pt states she is unable to lie flat to sleep the last couple of nights. Denies O2 use at home. NAD Noted.

## 2020-05-11 NOTE — Patient Instructions (Signed)

## 2020-05-11 NOTE — ED Notes (Signed)
Patient placed on cardiac monitor.

## 2020-05-11 NOTE — Progress Notes (Signed)
Pt has arrived on unit from [facility], [id number] paged waiting for admitting MD.

## 2020-05-11 NOTE — Progress Notes (Signed)
SLEEP MEDICINE CLINIC    Provider:  Larey Seat, MD  Primary Care Physician:  Audley Hose, MD [address] [place] [address]     Referring Provider: Jannifer Franklin, MD  And cc to Dr Rankin,MD   Chief Complaint according to patient   Patient presents with:    . New Patient (Initial Visit)           HISTORY OF PRESENT ILLNESS: 05-11-2020 CURRAN POTENZA is a 80 - year old  African- American female patient of Dr. Jannifer Franklin was recently admitted agian to hospital for another stroke-  and is seen here upon his referral on 05/11/2020  for a sleep apnea evaluation- she a is also a patient o dr Zadie Rhine, had "eye strokes" and proliferative diabetic retinopathy. She reports the back of the ey was swellling and she requires daily eye drops.  Chief concern according to patient : see above. L;ast visit with Dr Jannifer Franklin was in March 2022, last hospitalization jan 22- feb 9th 22.     Berton Lan Penagos  has a past medical history of Arthritis, Cerebrovascular disease, CKD (chronic kidney disease), Coronary artery disease, Depression, INSULIN dependent type 2  Diabetes ([place]), Diabetic peripheral neuropathy ([place]), Diabetic retinopathy ([place]), Diverticulitis, GERD (gastroesophageal reflux disease), History of CVA (cerebrovascular accident), Hyperlipidemia, Hypertension, Obesity, Renal lesion, S/P TAVR (transcatheter aortic valve replacement) (11/13/2018), and Severe aortic stenosis and uncontrolled hypertension. .   The patient had the first sleep study in the year 2004 or 2005,  with a result of an AHI ( Apnea Hypopnea index)  of OSA.   Sleep relevant medical history: Nocturia 20 -3 times. Recurrent sinusitis , nasal drip, no tonsillectomy. DM 2with multiple manifestation, complicated, insulin dependent.    Family medical /sleep history: many other family members with DM, Strokes, CAD, nobody  on CPAP with OSA. Mother, father and brother had heart disease     Social history:  Patient is  retired from Glass blower/designer for [employer], and lives in a household with alone, with a dog,  a daughter and granddaughter live close.  Family status is widowed, with adult children.  Tobacco use- none .  ETOH use - none , Caffeine intake in form of Coffee( 2 cups a day) Soda( /) Tea ( some ) no energy drinks. Regular exercise- not established .  Sleep habits are as follows: The patient's dinner time is between 6 PM. The patient goes to bed at 10-11 PM and is asleep promptly, continues to sleep for 3 hours, wakes for 3-4  bathroom breaks, the first time at 2 AM.   The preferred sleep position is sideays, with the support of 2 pillows.  Dreams are reportedly frequent.  6.30 AM is the usual rise time for med intake- sometimes she will go to bed, up by 9 AM. The patient wakes up spontaneously  She reports not feeling refreshed or restored in AM, with symptoms such as  lightheadedness and residual fatigue. Naps are taken frequently, lasting from 20-40 minutes and are more refreshing than nocturnal sleep.    Review of Systems: Out of a complete 14 system review, the patient complains of only the following symptoms, and all other reviewed systems are negative.:  Fatigue, sleepiness , snoring, fragmented sleep due to nocturia. Leg cramping with PVD . neuropathy    How likely are you to doze in the following situations: 0 = not likely, 1 = slight chance, 2 = moderate chance, 3 = high  chance   Sitting and Reading? Watching Television? Sitting inactive in a public place (theater or meeting)? As a passenger in a car for an hour without a break? Lying down in the afternoon when circumstances permit? Sitting and talking to someone? Sitting quietly after lunch without alcohol? In a car, while stopped for a few minutes in traffic?   Total = 9/ 24 points   FSS endorsed at nA/ 63 points.  G. Depression Score 3 / 15 points.   Social History   Socioeconomic History  . Marital status: Widowed     Spouse name: Not on file  . Number of children: 2  . Years of education: PHD  . Highest education level: Not on file  Occupational History  . Occupation: Retired PhD Pharmacist, hospital [employer]  Tobacco Use  . Smoking status: Never Smoker  . Smokeless tobacco: Never Used  Vaping Use  . Vaping Use: Never used  Substance and Sexual Activity  . Alcohol use: No  . Drug use: No  . Sexual activity: Not on file  Other Topics Concern  . Not on file  Social History Narrative   Lives alone, has brother close by   Patient is right-handed.   Caffeine use: 2-3 cups every day   Social Determinants of Health   Financial Resource Strain: Not on file  Food Insecurity: Not on file  Transportation Needs: Not on file  Physical Activity: Not on file  Stress: Not on file  Social Connections: Not on file    Family History  Problem Relation Age of Onset  . Diabetes Mother   . Heart disease Mother   . Prostate cancer Father   . Hypertension Brother   . Prostate cancer Brother     Past Medical History:  Diagnosis Date  . Arthritis   . Cerebrovascular disease   . CKD (chronic kidney disease)    Sees Dr Hollie Salk  . Coronary artery disease   . Depression   . Diabetes ([place])    INSULIN DEPENDENT  . Diabetic peripheral neuropathy ([place])   . Diabetic retinopathy ([place])   . Diverticulitis   . GERD (gastroesophageal reflux disease)   . History of CVA (cerebrovascular accident)    x 2 no residulal  . Hyperlipidemia   . Hypertension   . Obesity   . Renal lesion   . S/P TAVR (transcatheter aortic valve replacement) 11/13/2018   s/p TAVR with a 11m Edwards S3U via the TF approach by Dr. ORoxy Mannsand Dr. MAngelena Form . Severe aortic stenosis     Past Surgical History:  Procedure Laterality Date  . ABDOMINAL HYSTERECTOMY    . AV FISTULA PLACEMENT Left 12/18/2017   Procedure: ARTERIOVENOUS (AV) FISTULA CREATION ARM;  Surgeon: CMarty Heck MD;  [facility]  Service: Vascular;  Laterality: Left;  .  bilateral cataract surgery    . CARDIAC CATHETERIZATION  01/07/2014   DR GHobsonRESECTION  02/1999  . COLONOSCOPY    . COLOSTOMY  02/1999  . COLOSTOMY CLOSURE  07/1999  . EYE SURGERY Left 2019  . FISTULA SUPERFICIALIZATION Left 06/29/2018   Procedure: FISTULA SUPERFICIALIZATION LEFT BRACHIOCEPHALIC;  Surgeon: CMarty Heck MD;  [facility]  Service: Vascular;  Laterality: Left;  . LEFT HEART CATHETERIZATION WITH CORONARY ANGIOGRAM N/A 01/07/2014   Procedure: LEFT HEART CATHETERIZATION WITH CORONARY ANGIOGRAM;  Surgeon: JLaverda Page MD;  [facility];  Service: Cardiovascular;  Laterality: N/A;  . LOOP RECORDER INSERTION N/A 02/17/2020  Procedure: LOOP RECORDER INSERTION;  Surgeon: Deboraha Sprang, MD;  Location: [facility];  Service: Cardiovascular;  Laterality: N/A;  . middle cerebral artery stent placement Right   . OTHER SURGICAL HISTORY     laser surgery  . PTCA  01/07/2014   DES to RCA    DR Einar Gip  . right knee surgery Right    for infection  . RIGHT/LEFT HEART CATH AND CORONARY ANGIOGRAPHY N/A 10/23/2018   Procedure: RIGHT/LEFT HEART CATH AND CORONARY ANGIOGRAPHY;  Surgeon: Nigel Mormon, MD;  Location: [facility];  Service: Cardiovascular;  Laterality: N/A;  . TEE WITHOUT CARDIOVERSION N/A 11/13/2018   Procedure: TRANSESOPHAGEAL ECHOCARDIOGRAM (TEE);  Surgeon: Burnell Blanks, MD;  Location: [facility];  Service: Open Heart Surgery;  Laterality: N/A;  . TRANSCATHETER AORTIC VALVE REPLACEMENT, TRANSFEMORAL  11/13/2018  . TRANSCATHETER AORTIC VALVE REPLACEMENT, TRANSFEMORAL N/A 11/13/2018   Procedure: TRANSCATHETER AORTIC VALVE REPLACEMENT, TRANSFEMORAL;  Surgeon: Burnell Blanks, MD;  Location: [facility];  Service: Open Heart Surgery;  Laterality: N/A;     Current Outpatient Medications on File Prior to Visit  Medication Sig Dispense Refill  . acetaminophen (TYLENOL) 325 MG tablet Take 650 mg by mouth every 6 (six)  hours as needed for moderate pain or headache.     . allopurinol (ZYLOPRIM) 100 MG tablet Take 50 mg by mouth daily.    Marland Kitchen amLODipine (NORVASC) 10 MG tablet TAKE 1 TABLET BY MOUTH EVERY DAY (Patient taking differently: Take 10 mg by mouth daily.) 90 tablet 3  . aspirin EC 81 MG tablet Take 1 tablet (81 mg total) by mouth daily. Swallow whole. 150 tablet 2  . brimonidine (ALPHAGAN) 0.2 % ophthalmic solution Place into the left eye once.    . calcitRIOL (ROCALTROL) 0.25 MCG capsule Take 0.25 mcg by mouth daily.     . cetirizine (ZYRTEC) 10 MG tablet Take 10 mg by mouth daily as needed for allergies.    . folic acid (FOLVITE) 1 MG tablet Take 1 mg by mouth daily.    Marland Kitchen HUMALOG KWIKPEN 100 UNIT/ML KwikPen Inject 2-8 Units into the skin 3 (three) times daily before meals.     . hydrALAZINE (APRESOLINE) 50 MG tablet Take 1 tablet (50 mg total) by mouth every 8 (eight) hours. 90 tablet 0  . insulin glargine (LANTUS) 100 UNIT/ML injection Inject 0.12 mLs (12 Units total) into the skin at bedtime. 10 mL 11  . isosorbide mononitrate (IMDUR) 30 MG 24 hr tablet Take 1 tablet (30 mg total) by mouth daily. 30 tablet 0  . labetalol (NORMODYNE) 200 MG tablet Take 1 tablet (200 mg total) by mouth 2 (two) times daily. 60 tablet 0  . nitroGLYCERIN (NITROSTAT) 0.4 MG SL tablet PLACE 1 TABLET (0.4 MG TOTAL) UNDER THE TONGUE EVERY 5 MINUTES X 3 DOSES AS NEEDED FOR CHEST PAIN. (Patient taking differently: Place 0.4 mg under the tongue every 5 (five) minutes as needed for chest pain.) 25 tablet 1  . ONE TOUCH ULTRA TEST test strip 1 each by Other route 3 (three) times daily between meals.    . polyethylene glycol (MIRALAX / GLYCOLAX) 17 g packet Take 17 g by mouth daily. 30 each 0  . polyvinyl alcohol (LIQUIFILM TEARS) 1.4 % ophthalmic solution Place 1 drop into both eyes 3 (three) times daily as needed for dry eyes.    . potassium chloride (KLOR-CON) 10 MEQ tablet Take 1 tablet (10 mEq total) by mouth daily. 30 tablet 0  .  rosuvastatin (CRESTOR) 40 MG tablet Take 1 tablet (40 mg total) by mouth daily. 40 tablet 0  . senna-docusate (SENOKOT-S) 8.6-50 MG tablet Take 2 tablets by mouth 2 (two) times daily. 120 tablet 0  . torsemide (DEMADEX) 20 MG tablet Take 2 tablets (40 mg total) by mouth daily. 60 tablet 0  . venlafaxine XR (EFFEXOR-XR) 150 MG 24 hr capsule Take 1 capsule (150 mg total) by mouth daily. 30 capsule 0   No current facility-administered medications on file prior to visit.    No Known Allergies  Physical exam:  Today's Vitals   05/11/20 0823  BP: (!) 195/69  Pulse: 62  SpO2: 98%  Weight: 176 lb (79.8 kg)  Height: '5\' 3"'$  (1.6 m)   Body mass index is 31.18 kg/m.   Wt Readings from Last 3 Encounters:  05/11/20 176 lb (79.8 kg)  04/01/20 163 lb 3.2 oz (74 kg)  03/20/20 161 lb 3.2 oz (73.1 kg)     Ht Readings from Last 3 Encounters:  05/11/20 '5\' 3"'$  (1.6 m)  04/01/20 '5\' 3"'$  (1.6 m)  03/20/20 '5\' 3"'$  (1.6 m)      General: The patient is awake, alert and appears not in acute distress. The patient is well groomed. Head: Normocephalic, atraumatic. Neck is supple. Mallampati 2,  neck circumference:15 inches . Nasal airflow is barely patent.  Crossbite to the right.Dental status:  Cardiovascular:  Regular rate and cardiac rhythm by pulse,  without distended neck veins. Respiratory: Lungs are clear to auscultation.  Skin:  Without evidence of ankle edema, or rash. Trunk: The patient's posture is erect.   Neurologic exam : The patient is awake and alert, oriented to place and time.   Memory subjective described as intact.  Attention span & concentration ability appears normal.  Speech is fluent,  without dysarthria, dysphonia or aphasia.  Mood and affect are appropriate.   Cranial nerves: no loss of smell or taste reported  Pupils are unequal, the left is disrounded and unable to constrict, the right is only 3 mm wide- and minimally reactive to light. Funduscopic exam deferred.    Extraocular movements in vertical and horizontal planes were intact and without nystagmus. No Diplopia. Has described a halo effect.  Visual fields by Forsberg perimetry are restricted. Left side vision loss - Hearing was intact to soft voice and [place] rubbing.    Facial sensation intact to fine touch.  Facial motor strength is symmetric and tongue and uvula move midline.  The jaw moves to the right.  Neck ROM : rotation, tilt and flexion extension were normal for age and her leftshoulder shrug was restricted. .    Motor exam:  Symmetric bulk, tone and ROM.   Normal tone without cog-wheeling, symmetric grip strength .   Sensory:  Fine touch and vibration were absent in toes and restricted in her ankles. Proprioception tested in the upper extremities was normal.   Coordination: Rapid alternating movements in the fingers/hands were of normal speed.  The Guardado-to-nose maneuver was intact without evidence of ataxia, dysmetria or tremor.   Gait and station: Patient could rise unassisted from a seated position, walked without assistive device- she was asked by PT to use a cane.  Deep tendon reflexes: in the  upper and lower extremities are hyperreflexic- very , very brisk.  Babinski response was deferred.       After spending a total time of  45  minutes face to face and additional time for physical and neurologic examination, review  of laboratory studies,  personal review of imaging studies, reports and results of other testing and review of referral information / records as far as provided in visit, I have established the following assessments:  1) This patient has suffered  4 stroke events including to the vascularization of the eye. she had many more TIAs.  2) OSA can be a risk factor in recurrent embolic strokes, and she reports nocturnal palpitations, daily naps. 30 her retinal disease may also benefit from OSA therapy if such is present.    My Plan is to proceed with:  1) attended  sleep study preferred, this si for heart rate, rhythm and hypoxia determination.  2) leg cramping reported- RLS? , the irresistible urge to mash and massage, but not delaying sleep.   This would also need an attended sleep study to be captured.  3) the nocturia can be a symptom of OSA,   I would like to thank Audley Hose, MD and Audley Hose, [place] [address],  [place] [address] for allowing me to meet with and to take care of this pleasant patient.   In short, AIYANNAH DISHMAN is presenting with EDS, fatigue, RLS, nocturnal palpitations and recurrent strokes, in the setting of known aortic stenosis.  I plan to follow up either personally or through our NP within 2-4 month.   CC: I will share my notes with PCP  Electronically signed by: Larey Seat, MD 05/11/2020 9:22 AM  [facility] Board certified by The [employer] of Sleep Medicine and Diplomate of the [employer] of Sleep Medicine. Board certified In Neurology through the [place], Fellow of the [employer] of Neurology. Medical Director of [facility].

## 2020-05-11 NOTE — H&P (Signed)
History and Physical    RACHELLA EHRESMANN [id number] DOB: May 26, 1940 DOA: 05/11/2020  PCP: Audley Hose, MD (Confirm with patient/family/NH records and if not entered, this has to be entered at [facility] point of entry) Patient coming from: Home  I have personally briefly reviewed patient's old medical records in [place]  Chief Complaint: SOB  HPI: LOURDEZ CONFAIR is a 80 y.o. female with medical history significant of chronic diastolic CHF, aortic stenosis status post biosynthetic aortic valve replacement 2020, recurrent CVA, poorly controlled hypertension, IDDM, CAD status post stenting, CKD stage IV, presented with increasing shortness of breath.  Symptoms started 3 days ago cardiac getting worse, symptoms including exertional dyspnea, orthopnea, dry cough.  Denies any chest pain no palpitations no fever or chills.  No leg swelling.  This year in January patient had recurrent CVA, when it was found she had significant elevation of blood pressure and her cardiologist Dr. Einar Gip start patient on hydralazine and Imdur regimen.  Recently, cardiology increased her hydralazine from 50 mg 3 times daily to 75 mg 3 times daily.  However patient reported self-monitoring blood pressure at home record most occasions, her SBP> 160-170. ED Course: Blood pressure significant elevated, for exam showed fluid overload and x-ray showed lung congestion, 1 dose of 80 mg IV Lasix given in ED.  Review of Systems: As per HPI otherwise 14 point review of systems negative.    Past Medical History:  Diagnosis Date  . Arthritis   . Cerebrovascular disease   . CKD (chronic kidney disease)    Sees Dr Hollie Salk  . Coronary artery disease   . Depression   . Diabetes ([place])    INSULIN DEPENDENT  . Diabetic peripheral neuropathy ([place])   . Diabetic retinopathy ([place])   . Diverticulitis   . GERD (gastroesophageal reflux disease)   . History of CVA (cerebrovascular accident)    x 2 no residulal  . Hyperlipidemia    . Hypertension   . Obesity   . Renal lesion   . S/P TAVR (transcatheter aortic valve replacement) 11/13/2018   s/p TAVR with a 38m Edwards S3U via the TF approach by Dr. ORoxy Mannsand Dr. MAngelena Form . Severe aortic stenosis     Past Surgical History:  Procedure Laterality Date  . ABDOMINAL HYSTERECTOMY    . AV FISTULA PLACEMENT Left 12/18/2017   Procedure: ARTERIOVENOUS (AV) FISTULA CREATION ARM;  Surgeon: CMarty Heck MD;  [facility]  Service: Vascular;  Laterality: Left;  . bilateral cataract surgery    . CARDIAC CATHETERIZATION  01/07/2014   [place]  02[place]  . COLONOSCOPY    . COLOSTOMY  02/1999  . COLOSTOMY CLOSURE  07/1999  . EYE SURGERY Left 2019  . FISTULA SUPERFICIALIZATION Left 06/29/2018   Procedure: FISTULA SUPERFICIALIZATION LEFT BRACHIOCEPHALIC;  Surgeon: CMarty Heck MD;  [place] [facility]  Service: Vascular;  Laterality: Left;  . LEFT HEART CATHETERIZATION WITH CORONARY ANGIOGRAM N/A 01/07/2014   Procedure: LEFT HEART CATHETERIZATION WITH CORONARY ANGIOGRAM;  Surgeon: JLaverda Page MD;  [facility];  Service: Cardiovascular;  Laterality: N/A;  . LOOP RECORDER INSERTION N/A 02/17/2020   Procedure: LOOP RECORDER INSERTION;  Surgeon: KDeboraha Sprang MD;  [facility];  Service: Cardiovascular;  Laterality: N/A;  . middle cerebral artery stent placement Right   . OTHER SURGICAL HISTORY     laser surgery  . PTCA  01/07/2014   DES to RCA  DR Einar Gip  . right knee surgery Right    for infection  . RIGHT/LEFT HEART CATH AND CORONARY ANGIOGRAPHY N/A 10/23/2018   Procedure: RIGHT/LEFT HEART CATH AND CORONARY ANGIOGRAPHY;  Surgeon: Nigel Mormon, MD;  Location: [place] CV LAB;  Service: Cardiovascular;  Laterality: N/A;  . TEE WITHOUT CARDIOVERSION N/A 11/13/2018   Procedure: TRANSESOPHAGEAL ECHOCARDIOGRAM (TEE);  Surgeon: Burnell Blanks, MD;  Location: [place];  Service: Open Heart  Surgery;  Laterality: N/A;  . TRANSCATHETER AORTIC VALVE REPLACEMENT, TRANSFEMORAL  11/13/2018  . TRANSCATHETER AORTIC VALVE REPLACEMENT, TRANSFEMORAL N/A 11/13/2018   Procedure: TRANSCATHETER AORTIC VALVE REPLACEMENT, TRANSFEMORAL;  Surgeon: Burnell Blanks, MD;  Location: [place];  Service: Open Heart Surgery;  Laterality: N/A;     reports that she has never smoked. She has never used smokeless tobacco. She reports that she does not drink alcohol and does not use drugs.  No Known Allergies  Family History  Problem Relation Age of Onset  . Diabetes Mother   . Heart disease Mother   . Prostate cancer Father   . Hypertension Brother   . Prostate cancer Brother      Prior to Admission medications   Medication Sig Start Date End Date Taking? Authorizing Provider  acetaminophen (TYLENOL) 325 MG tablet Take 650 mg by mouth every 6 (six) hours as needed for moderate pain or headache.     [provider]  allopurinol (ZYLOPRIM) 100 MG tablet Take 50 mg by mouth daily.    [provider]  amLODipine (NORVASC) 10 MG tablet TAKE 1 TABLET BY MOUTH EVERY DAY Patient taking differently: Take 10 mg by mouth daily. 11/11/19   Patwardhan, Reynold Bowen, MD  aspirin EC 81 MG tablet Take 1 tablet (81 mg total) by mouth daily. Swallow whole. 02/17/20 02/16/21  Riesa Pope, MD  brimonidine (ALPHAGAN) 0.2 % ophthalmic solution Place into the left eye once. 04/19/19   [provider]  calcitRIOL (ROCALTROL) 0.25 MCG capsule Take 0.25 mcg by mouth daily.     [provider]  cetirizine (ZYRTEC) 10 MG tablet Take 10 mg by mouth daily as needed for allergies.    [provider]  folic acid (FOLVITE) 1 MG tablet Take 1 mg by mouth daily. 12/07/12   [provider]  HUMALOG KWIKPEN 100 UNIT/ML KwikPen Inject 2-8 Units into the skin 3 (three) times daily before meals.  05/28/18   [provider]  hydrALAZINE (APRESOLINE) 50 MG tablet Take 1  tablet (50 mg total) by mouth every 8 (eight) hours. 02/26/20   Love, Ivan Anchors, PA-C  insulin glargine (LANTUS) 100 UNIT/ML injection Inject 0.12 mLs (12 Units total) into the skin at bedtime. 02/26/20   Love, Ivan Anchors, PA-C  isosorbide mononitrate (IMDUR) 30 MG 24 hr tablet Take 1 tablet (30 mg total) by mouth daily. 02/27/20   Love, Ivan Anchors, PA-C  labetalol (NORMODYNE) 200 MG tablet Take 1 tablet (200 mg total) by mouth 2 (two) times daily. 02/26/20   Love, Ivan Anchors, PA-C  nitroGLYCERIN (NITROSTAT) 0.4 MG SL tablet PLACE 1 TABLET (0.4 MG TOTAL) UNDER THE TONGUE EVERY 5 MINUTES X 3 DOSES AS NEEDED FOR CHEST PAIN. Patient taking differently: Place 0.4 mg under the tongue every 5 (five) minutes as needed for chest pain. 01/03/19   Miquel Dunn, NP  ONE TOUCH ULTRA TEST test strip 1 each by Other route 3 (three) times daily between meals. 12/18/12   [provider]  polyethylene glycol (MIRALAX /  GLYCOLAX) 17 g packet Take 17 g by mouth daily. 02/27/20   Love, Ivan Anchors, PA-C  polyvinyl alcohol (LIQUIFILM TEARS) 1.4 % ophthalmic solution Place 1 drop into both eyes 3 (three) times daily as needed for dry eyes.    [provider]  potassium chloride (KLOR-CON) 10 MEQ tablet Take 1 tablet (10 mEq total) by mouth daily. 02/27/20   Love, Ivan Anchors, PA-C  rosuvastatin (CRESTOR) 40 MG tablet Take 1 tablet (40 mg total) by mouth daily. 02/26/20   Love, Ivan Anchors, PA-C  senna-docusate (SENOKOT-S) 8.6-50 MG tablet Take 2 tablets by mouth 2 (two) times daily. 02/26/20   Love, Ivan Anchors, PA-C  torsemide (DEMADEX) 20 MG tablet Take 2 tablets (40 mg total) by mouth daily. 02/26/20   Love, Ivan Anchors, PA-C  venlafaxine XR (EFFEXOR-XR) 150 MG 24 hr capsule Take 1 capsule (150 mg total) by mouth daily. 02/27/20   Bary Leriche, PA-C    Physical Exam: Vitals:   05/11/20 1630 05/11/20 1715 05/11/20 1730 05/11/20 1840  BP: (!) 164/61 (!) 160/51 (!) 162/59 (!) 160/52  Pulse: (!) 58   61  Resp: '17 17 20 16   '$ Temp:    98.5 F (36.9 C)  TempSrc:    Oral  SpO2: 100%   100%  Weight:      Height:        Constitutional: NAD, calm, comfortable Vitals:   05/11/20 1630 05/11/20 1715 05/11/20 1730 05/11/20 1840  BP: (!) 164/61 (!) 160/51 (!) 162/59 (!) 160/52  Pulse: (!) 58   61  Resp: '17 17 20 16  '$ Temp:    98.5 F (36.9 C)  TempSrc:    Oral  SpO2: 100%   100%  Weight:      Height:       Eyes: PERRL, lids and conjunctivae normal ENMT: Mucous membranes are moist. Posterior pharynx clear of any exudate or lesions.Normal dentition.  Neck: normal, supple, no masses, no thyromegaly Respiratory: clear to auscultation bilaterally, no wheezing, fine crackles on bilateral bases. Normal respiratory effort. No accessory muscle use.  Cardiovascular: Regular rate and rhythm, loud systolic murmur on carb-based. No extremity edema. 2+ pedal pulses. No carotid bruits.  Abdomen: no tenderness, no masses palpated. No hepatosplenomegaly. Bowel sounds positive.  Musculoskeletal: no clubbing / cyanosis. No joint deformity upper and lower extremities. Good ROM, no contractures. Normal muscle tone.  Skin: no rashes, lesions, ulcers. No induration Neurologic: CN 2-12 grossly intact. Sensation intact, DTR normal. Strength 5/5 in all 4.  Psychiatric: Normal judgment and insight. Alert and oriented x 3. Normal mood.     Labs on Admission: I have personally reviewed following labs and imaging studies  CBC: Recent Labs  Lab 05/11/20 1352  WBC 9.8  HGB 10.2*  HCT 31.7*  MCV 89.3  PLT [id number]   Basic Metabolic Panel: Recent Labs  Lab 05/11/20 1352  NA 138  K 4.4  CL 105  CO2 24  GLUCOSE 143*  BUN 42*  CREATININE 2.82*  CALCIUM 9.3   GFR: Estimated Creatinine Clearance: 15.7 mL/min (A) (by C-G formula based on SCr of 2.82 mg/dL (H)). Liver Function Tests: Recent Labs  Lab 05/11/20 1352  AST 27  ALT 19  ALKPHOS 72  BILITOT 0.4  PROT 6.3*  ALBUMIN 3.7   No results for input(s): LIPASE, AMYLASE  in the last 168 hours. No results for input(s): AMMONIA in the last 168 hours. Coagulation Profile: No results for input(s): INR, PROTIME in the last 168  hours. Cardiac Enzymes: No results for input(s): CKTOTAL, CKMB, CKMBINDEX, TROPONINI in the last 168 hours. BNP (last 3 results) No results for input(s): PROBNP in the last 8760 hours. HbA1C: No results for input(s): HGBA1C in the last 72 hours. CBG: No results for input(s): GLUCAP in the last 168 hours. Lipid Profile: No results for input(s): CHOL, HDL, LDLCALC, TRIG, CHOLHDL, LDLDIRECT in the last 72 hours. Thyroid Function Tests: No results for input(s): TSH, T4TOTAL, FREET4, T3FREE, THYROIDAB in the last 72 hours. Anemia Panel: No results for input(s): VITAMINB12, FOLATE, FERRITIN, TIBC, IRON, RETICCTPCT in the last 72 hours. Urine analysis:    Component Value Date/Time   COLORURINE STRAW (A) 02/08/2020 1327   APPEARANCEUR CLEAR 02/08/2020 1327   LABSPEC 1.010 02/08/2020 1327   PHURINE 8.0 02/08/2020 1327   GLUCOSEU 50 (A) 02/08/2020 1327   HGBUR NEGATIVE 02/08/2020 1327   BILIRUBINUR NEGATIVE 02/08/2020 1327   KETONESUR NEGATIVE 02/08/2020 1327   PROTEINUR >=300 (A) 02/08/2020 1327   UROBILINOGEN 0.2 07/23/2009 0528   NITRITE NEGATIVE 02/08/2020 1327   LEUKOCYTESUR NEGATIVE 02/08/2020 1327    Radiological Exams on Admission: DG Chest 2 View  Result Date: 05/11/2020 CLINICAL DATA:  Shortness of breath EXAM: CHEST - 2 VIEW COMPARISON:  April 23, 2019 FINDINGS: There is cardiomegaly with pulmonary venous hypertension. There is a small right pleural effusion. No edema or airspace opacity. Patient is status post aortic valve replacement. A loop recorder is present on the left anteriorly. No adenopathy. Degenerative change noted in the thoracic spine. There is aortic atherosclerosis. IMPRESSION: Cardiomegaly with pulmonary vascular congestion. Small right pleural effusion. No edema or airspace opacity. Status post aortic valve  replacement. Loop recorder on the left. Aortic Atherosclerosis (ICD10-I70.0). Electronically Signed   By: Lowella Grip III [name]   On: 05/11/2020 14:20    EKG: Independently reviewed.  Sinus bradycardia and first-degree AV block  Assessment/Plan Active Problems:   CHF (congestive heart failure) ([place])  (please populate well all problems here in Problem List. (For example, if patient is on BP meds at home and you resume or decide to hold them, it is a problem that needs to be her. Same for CAD, COPD, HLD and so on)  Acute on chronic diastolic CHF decompensation -Likely secondary to uncontrolled hypertension. -Continue to diurese -Increase hydralazine from 25 to 100 mg every 8 hours, increase Imdur from 30 to 60 mg daily.  Continue labetalol 200 twice daily and amlodipine 10 mg daily. -Echocardiogram  Uncontrolled hypertension -As above  IDDM -Continue sliding scale  Stage IV CKD -Fluid overload, creatinine level stable, continue diuresis and monitor kidney function  Sinus bradycardia -Little room to further increase beta-blocker.  DVT prophylaxis: Heparin subcu  code Status: Full Code Family Communication: None at bedside Disposition Plan: Expect less than 2 midnight hospital stay, once CHF symptoms stabilized likely can discharge home and continue to titrate BP meds as outpatient. Consults called: none Admission status: Tele obs   Lequita Halt MD [facility] Pager [phone]  05/11/2020, 7:37 PM

## 2020-05-12 ENCOUNTER — Observation Stay (HOSPITAL_COMMUNITY): Payer: Medicare PPO

## 2020-05-12 ENCOUNTER — Ambulatory Visit (HOSPITAL_BASED_OUTPATIENT_CLINIC_OR_DEPARTMENT_OTHER): Payer: Medicare PPO

## 2020-05-12 DIAGNOSIS — I5043 Acute on chronic combined systolic (congestive) and diastolic (congestive) heart failure: Secondary | ICD-10-CM | POA: Diagnosis not present

## 2020-05-12 DIAGNOSIS — N189 Chronic kidney disease, unspecified: Secondary | ICD-10-CM | POA: Diagnosis not present

## 2020-05-12 DIAGNOSIS — N179 Acute kidney failure, unspecified: Secondary | ICD-10-CM | POA: Diagnosis not present

## 2020-05-12 DIAGNOSIS — R0602 Shortness of breath: Secondary | ICD-10-CM

## 2020-05-12 DIAGNOSIS — I5033 Acute on chronic diastolic (congestive) heart failure: Secondary | ICD-10-CM | POA: Diagnosis not present

## 2020-05-12 LAB — BASIC METABOLIC PANEL
Anion gap: 11 (ref 5–15)
BUN: 42 mg/dL — ABNORMAL HIGH (ref 8–23)
CO2: 23 mmol/L (ref 22–32)
Calcium: 9 mg/dL (ref 8.9–10.3)
Chloride: 102 mmol/L (ref 98–111)
Creatinine, Ser: 3.2 mg/dL — ABNORMAL HIGH (ref 0.44–1.00)
GFR, Estimated: 14 mL/min — ABNORMAL LOW (ref >60)
Glucose, Bld: 269 mg/dL — ABNORMAL HIGH (ref 70–99)
Potassium: 4.5 mmol/L (ref 3.5–5.1)
Sodium: 136 mmol/L (ref 135–145)

## 2020-05-12 LAB — ECHOCARDIOGRAM COMPLETE
AR max vel: 0.73 cm2
AV Area VTI: 0.81 cm2
AV Area mean vel: 0.76 cm2
AV Mean grad: 15 mmHg
AV Peak grad: 28.7 mmHg
Ao pk vel: 2.68 m/s
Area-P 1/2: 2.69 cm2
Calc EF: 56.2 %
Height: 63 in
MV M vel: 3.14 m/s
MV Peak grad: 39.4 mmHg
S' Lateral: 2.8 cm
Single Plane A2C EF: 47.6 %
Single Plane A4C EF: 64.5 %
Weight: 2816 oz

## 2020-05-12 LAB — GLUCOSE, CAPILLARY
Glucose-Capillary: 221 mg/dL — ABNORMAL HIGH (ref 70–99)
Glucose-Capillary: 125 mg/dL — ABNORMAL HIGH (ref 70–99)
Glucose-Capillary: 153 mg/dL — ABNORMAL HIGH (ref 70–99)
Glucose-Capillary: 131 mg/dL — ABNORMAL HIGH (ref 70–99)

## 2020-05-12 MED ORDER — EZETIMIBE 10 MG PO TABS
10.0000 mg | ORAL_TABLET | Freq: Every day | ORAL | Status: DC
  Administered 2020-05-12 – 2020-05-15 (×4): 10 mg via ORAL
  Filled 2020-05-12 (×4): qty 1

## 2020-05-12 MED ORDER — HYDRALAZINE HCL 50 MG PO TABS
50.0000 mg | ORAL_TABLET | Freq: Three times a day (TID) | ORAL | Status: DC
  Administered 2020-05-12 – 2020-05-15 (×9): 50 mg via ORAL
  Filled 2020-05-12 (×9): qty 1

## 2020-05-12 NOTE — Progress Notes (Signed)
2D echocardiogram completed.  05/12/2020 11:21 AM Kelby Aline., MHA, RVT, RDCS, RDMS

## 2020-05-12 NOTE — Progress Notes (Signed)
Progress Note    [name]  [id number] DOB: 28-Aug-1940  DOA: 05/11/2020 PCP: Audley Hose, MD    Brief Narrative:     Medical records reviewed and are as summarized below:  [name] is an 80 y.o. female with medical history significant of chronic diastolic CHF, aortic stenosis status post biosynthetic aortic valve replacement 2020, recurrent CVA, poorly controlled hypertension, IDDM, CAD status post stenting, CKD stage IV, presented with increasing shortness of breath.  BP high and initially started on high dose IV lasix but this led to worsening kidney function.   Assessment/Plan:   Active Problems:   CKD (chronic kidney disease), stage IV (HCC)   Acute on chronic combined systolic and diastolic CHF (congestive heart failure) (HCC)   CHF (congestive heart failure) (HCC)   SOB due to either HTN urgency or CHF -hold diuresis due to worsening kidney function  -continue home meds- avoid HYPO- tension as well -Echocardiogram pending  Uncontrolled hypertension -As above  S/P TAVR (transcatheter aortic valve replacement) -echo pending  IDDM -SSI -on lantus  AKI on Stage IV CKD -due to IV lasix -d/c lasix and recheck in AM  obesity Body mass index is 31.18 kg/m.  Suspected untreated sleep apnea -per d/c note by IM residents during last admission -does not appear to have had a sleep study  H/o CVA -follows with neurology -on asa   Family Communication/Anticipated D/C date and plan/Code Status   DVT prophylaxis: heparin Code Status: Full Code.   Disposition Plan: Status is: Observation  The patient will require care spanning > 2 midnights and should be moved to inpatient because: Inpatient level of care appropriate due to severity of illness  Dispo: The patient is from: Home              Anticipated d/c is to: Home              Patient currently is not medically stable to d/c.   Difficult to place patient No         Medical  Consultants:    None.     Subjective:   SOB beter now that BP controlled Denied recent change in diet   Objective:    Vitals:   05/11/20 1840 05/11/20 1950 05/11/20 2334 05/12/20 0352  BP: (!) 160/52 (!) 175/55 (!) 127/49 (!) 123/45  Pulse: 61 (!) 59 63 (!) 58  Resp: '16 18 16 18  '$ Temp: 98.5 F (36.9 C) 98.2 F (36.8 C) 98.6 F (37 C) 98.7 F (37.1 C)  TempSrc: Oral Oral Oral Oral  SpO2: 100% 100% 100% 100%  Weight:      Height:        Intake/Output Summary (Last 24 hours) at 05/12/2020 [id number] Last data filed at 05/12/2020 0401 Gross per 24 hour  Intake --  Output 1600 ml  Net -1600 ml   Filed Weights   05/11/20 1259  Weight: 79.8 kg    Exam:  General: Appearance:    Obese female in no acute distress     Lungs:     respirations unlabored, no wheezing  Heart:    Bradycardic. Normal rhythm.  +murmur     Neurologic:   Awake, alert, oriented x 3. No apparent focal neurological           defect.     Data Reviewed:   I have personally reviewed following labs and imaging studies:  Labs: Labs show the following:   Basic Metabolic Panel:  Recent Labs  Lab 05/11/20 1352 05/12/20 0332  NA 138 136  K 4.4 4.5  CL 105 102  CO2 24 23  GLUCOSE 143* 269*  BUN 42* 42*  CREATININE 2.82* 3.20*  CALCIUM 9.3 9.0   GFR Estimated Creatinine Clearance: 13.8 mL/min (A) (by C-G formula based on SCr of 3.2 mg/dL (H)). Liver Function Tests: Recent Labs  Lab 05/11/20 1352  AST 27  ALT 19  ALKPHOS 72  BILITOT 0.4  PROT 6.3*  ALBUMIN 3.7   No results for input(s): LIPASE, AMYLASE in the last 168 hours. No results for input(s): AMMONIA in the last 168 hours. Coagulation profile No results for input(s): INR, PROTIME in the last 168 hours.  CBC: Recent Labs  Lab 05/11/20 1352  WBC 9.8  HGB 10.2*  HCT 31.7*  MCV 89.3  PLT 239   Cardiac Enzymes: No results for input(s): CKTOTAL, CKMB, CKMBINDEX, TROPONINI in the last 168 hours. BNP (last 3 results) No  results for input(s): PROBNP in the last 8760 hours. CBG: Recent Labs  Lab 05/11/20 2123 05/12/20 0603  GLUCAP 152* 221*   D-Dimer: No results for input(s): DDIMER in the last 72 hours. Hgb A1c: Recent Labs    05/11/20 2105  HGBA1C 7.3*   Lipid Profile: No results for input(s): CHOL, HDL, LDLCALC, TRIG, CHOLHDL, LDLDIRECT in the last 72 hours. Thyroid function studies: Recent Labs    05/11/20 2105  TSH 3.147   Anemia work up: No results for input(s): VITAMINB12, FOLATE, FERRITIN, TIBC, IRON, RETICCTPCT in the last 72 hours. Sepsis Labs: Recent Labs  Lab 05/11/20 1352  WBC 9.8    Microbiology Recent Results (from the past 240 hour(s))  Resp Panel by RT-PCR (Flu A&B, Covid) Nasopharyngeal Swab     Status: None   Collection Time: 05/11/20  3:31 PM   Specimen: Nasopharyngeal Swab; Nasopharyngeal(NP) swabs in vial transport medium  Result Value Ref Range Status   SARS Coronavirus 2 by RT PCR NEGATIVE NEGATIVE Final    Comment: (NOTE) SARS-CoV-2 target nucleic acids are NOT DETECTED.  The SARS-CoV-2 RNA is generally detectable in upper respiratory specimens during the acute phase of infection. The lowest concentration of SARS-CoV-2 viral copies this assay can detect is 138 copies/mL. A negative result does not preclude SARS-Cov-2 infection and should not be used as the sole basis for treatment or other patient management decisions. A negative result may occur with  improper specimen collection/handling, submission of specimen other than nasopharyngeal swab, presence of viral mutation(s) within the areas targeted by this assay, and inadequate number of viral copies(<138 copies/mL). A negative result must be combined with clinical observations, patient history, and epidemiological information. The expected result is Negative.  Fact Sheet for Patients:  EntrepreneurPulse.com.au  Fact Sheet for Healthcare Providers:   IncredibleEmployment.be  This test is no t yet approved or cleared by the [place] FDA and  has been authorized for detection and/or diagnosis of SARS-CoV-2 by FDA under an Emergency Use Authorization (EUA). This EUA will remain  in effect (meaning this test can be used) for the duration of the COVID-19 declaration under Section 564(b)(1) of the Act, 21 U.S.C.section 360bbb-3(b)(1), unless the authorization is terminated  or revoked sooner.       Influenza A by PCR NEGATIVE NEGATIVE Final   Influenza B by PCR NEGATIVE NEGATIVE Final    Comment: (NOTE) The Xpert Xpress SARS-CoV-2/FLU/RSV plus assay is intended as an aid in the diagnosis of influenza from Nasopharyngeal swab specimens and should not  be used as a sole basis for treatment. Nasal washings and aspirates are unacceptable for Xpert Xpress SARS-CoV-2/FLU/RSV testing.  Fact Sheet for Patients: EntrepreneurPulse.com.au  Fact Sheet for Healthcare Providers: IncredibleEmployment.be  This test is not yet approved or cleared by the [place] FDA and has been authorized for detection and/or diagnosis of SARS-CoV-2 by FDA under an Emergency Use Authorization (EUA). This EUA will remain in effect (meaning this test can be used) for the duration of the COVID-19 declaration under Section 564(b)(1) of the Act, 21 U.S.C. section 360bbb-3(b)(1), unless the authorization is terminated or revoked.  Performed at [facility]     Procedures and diagnostic studies:  DG Chest 1 View  Result Date: 05/12/2020 CLINICAL DATA:  Shortness of breath.  History of CHF. EXAM: CHEST  1 VIEW COMPARISON:  05/11/2020. FINDINGS: Loop recorder noted in stable position. Prior cardiac valve replacement. Cardiomegaly with mild pulmonary venous congestion. Low lung volumes with mild bibasilar atelectasis. No pleural effusion or pneumothorax IMPRESSION: 1. Loop recorder stable  position. Prior cardiac valve replacement. Cardiomegaly with mild pulmonary venous congestion. 2.  Low lung volumes with mild bibasilar atelectasis. Electronically Signed   By: Marcello Moores  Register   On: 05/12/2020 06:08   DG Chest 2 View  Result Date: 05/11/2020 CLINICAL DATA:  Shortness of breath EXAM: CHEST - 2 VIEW COMPARISON:  April 23, 2019 FINDINGS: There is cardiomegaly with pulmonary venous hypertension. There is a small right pleural effusion. No edema or airspace opacity. Patient is status post aortic valve replacement. A loop recorder is present on the left anteriorly. No adenopathy. Degenerative change noted in the thoracic spine. There is aortic atherosclerosis. IMPRESSION: Cardiomegaly with pulmonary vascular congestion. Small right pleural effusion. No edema or airspace opacity. Status post aortic valve replacement. Loop recorder on the left. Aortic Atherosclerosis (ICD10-I70.0). Electronically Signed   By: Lowella Grip III [name]   On: 05/11/2020 14:20    Medications:   . allopurinol  50 mg Oral Daily  . amLODipine  10 mg Oral Daily  . aspirin EC  81 mg Oral Daily  . brimonidine  1 drop Left Eye BID  . calcitRIOL  0.25 mcg Oral Daily  . folic acid  1 mg Oral Daily  . heparin  5,000 Units Subcutaneous Q12H  . hydrALAZINE  50 mg Oral Q8H  . insulin aspart  0-9 Units Subcutaneous TID WC  . insulin glargine  12 Units Subcutaneous QHS  . isosorbide mononitrate  60 mg Oral Daily  . labetalol  200 mg Oral BID  . loratadine  10 mg Oral Daily  . polyethylene glycol  17 g Oral Daily  . potassium chloride  10 mEq Oral Daily  . rosuvastatin  40 mg Oral Daily  . senna-docusate  2 tablet Oral BID  . sodium chloride flush  3 mL Intravenous Q12H  . venlafaxine XR  150 mg Oral Daily   Continuous Infusions: . sodium chloride       LOS: 0 days   Geradine Girt  [facility]   How to contact the [facility] Attending or Consulting provider [place] or covering provider during after  hours [place], for this patient?  1. Check the care team in [facility] and look for a) attending/consulting TR[facility] provider listed and b) the [facility] team listed 2. Log into [email or web address] and use [place] universal password to access. If you do not have the password, please contact the hospital operator. 3. Locate the [facility] provider you are  looking for under [facility] and page to a number that you can be directly reached. 4. If you still have difficulty reaching the provider, please page the [facility] (Director on Call) for the Hospitalists listed on amion for assistance.  05/12/2020, 8:26 AM

## 2020-05-12 NOTE — Progress Notes (Signed)
Heart Failure Nurse Navigator Progress Note  Will continue to follow this admission to determine screening for HV TOC readiness. Currently pt SCr 3+, AKI on CKD IV.   Pricilla Holm, RN, BSN Heart Failure Nurse Navigator [phone]

## 2020-05-13 DIAGNOSIS — I509 Heart failure, unspecified: Secondary | ICD-10-CM | POA: Diagnosis present

## 2020-05-13 DIAGNOSIS — Z952 Presence of prosthetic heart valve: Secondary | ICD-10-CM | POA: Diagnosis not present

## 2020-05-13 DIAGNOSIS — Z794 Long term (current) use of insulin: Secondary | ICD-10-CM | POA: Diagnosis not present

## 2020-05-13 DIAGNOSIS — I10 Essential (primary) hypertension: Secondary | ICD-10-CM | POA: Diagnosis not present

## 2020-05-13 DIAGNOSIS — Z20822 Contact with and (suspected) exposure to covid-19: Secondary | ICD-10-CM | POA: Diagnosis present

## 2020-05-13 DIAGNOSIS — Z955 Presence of coronary angioplasty implant and graft: Secondary | ICD-10-CM | POA: Diagnosis not present

## 2020-05-13 DIAGNOSIS — I130 Hypertensive heart and chronic kidney disease with heart failure and stage 1 through stage 4 chronic kidney disease, or unspecified chronic kidney disease: Secondary | ICD-10-CM | POA: Diagnosis present

## 2020-05-13 DIAGNOSIS — K219 Gastro-esophageal reflux disease without esophagitis: Secondary | ICD-10-CM | POA: Diagnosis present

## 2020-05-13 DIAGNOSIS — M069 Rheumatoid arthritis, unspecified: Secondary | ICD-10-CM | POA: Diagnosis present

## 2020-05-13 DIAGNOSIS — Z90710 Acquired absence of both cervix and uterus: Secondary | ICD-10-CM | POA: Diagnosis not present

## 2020-05-13 DIAGNOSIS — Z7982 Long term (current) use of aspirin: Secondary | ICD-10-CM | POA: Diagnosis not present

## 2020-05-13 DIAGNOSIS — I5043 Acute on chronic combined systolic (congestive) and diastolic (congestive) heart failure: Secondary | ICD-10-CM | POA: Diagnosis present

## 2020-05-13 DIAGNOSIS — Z6831 Body mass index (BMI) 31.0-31.9, adult: Secondary | ICD-10-CM | POA: Diagnosis not present

## 2020-05-13 DIAGNOSIS — I160 Hypertensive urgency: Secondary | ICD-10-CM | POA: Diagnosis present

## 2020-05-13 DIAGNOSIS — N179 Acute kidney failure, unspecified: Secondary | ICD-10-CM | POA: Diagnosis present

## 2020-05-13 DIAGNOSIS — I2510 Atherosclerotic heart disease of native coronary artery without angina pectoris: Secondary | ICD-10-CM | POA: Diagnosis present

## 2020-05-13 DIAGNOSIS — N189 Chronic kidney disease, unspecified: Secondary | ICD-10-CM | POA: Diagnosis not present

## 2020-05-13 DIAGNOSIS — T501X5A Adverse effect of loop [high-ceiling] diuretics, initial encounter: Secondary | ICD-10-CM | POA: Diagnosis present

## 2020-05-13 DIAGNOSIS — E669 Obesity, unspecified: Secondary | ICD-10-CM | POA: Diagnosis present

## 2020-05-13 DIAGNOSIS — I1310 Hypertensive heart and chronic kidney disease without heart failure, with stage 1 through stage 4 chronic kidney disease, or unspecified chronic kidney disease: Secondary | ICD-10-CM | POA: Diagnosis present

## 2020-05-13 DIAGNOSIS — Z79899 Other long term (current) drug therapy: Secondary | ICD-10-CM | POA: Diagnosis not present

## 2020-05-13 DIAGNOSIS — E1142 Type 2 diabetes mellitus with diabetic polyneuropathy: Secondary | ICD-10-CM | POA: Diagnosis present

## 2020-05-13 DIAGNOSIS — E11319 Type 2 diabetes mellitus with unspecified diabetic retinopathy without macular edema: Secondary | ICD-10-CM | POA: Diagnosis present

## 2020-05-13 DIAGNOSIS — Z8673 Personal history of transient ischemic attack (TIA), and cerebral infarction without residual deficits: Secondary | ICD-10-CM | POA: Diagnosis not present

## 2020-05-13 DIAGNOSIS — E785 Hyperlipidemia, unspecified: Secondary | ICD-10-CM | POA: Diagnosis present

## 2020-05-13 DIAGNOSIS — F32A Depression, unspecified: Secondary | ICD-10-CM | POA: Diagnosis present

## 2020-05-13 DIAGNOSIS — N2581 Secondary hyperparathyroidism of renal origin: Secondary | ICD-10-CM | POA: Diagnosis present

## 2020-05-13 DIAGNOSIS — N1832 Chronic kidney disease, stage 3b: Secondary | ICD-10-CM | POA: Diagnosis present

## 2020-05-13 LAB — GLUCOSE, CAPILLARY
Glucose-Capillary: 129 mg/dL — ABNORMAL HIGH (ref 70–99)
Glucose-Capillary: 116 mg/dL — ABNORMAL HIGH (ref 70–99)
Glucose-Capillary: 129 mg/dL — ABNORMAL HIGH (ref 70–99)
Glucose-Capillary: 133 mg/dL — ABNORMAL HIGH (ref 70–99)

## 2020-05-13 LAB — BASIC METABOLIC PANEL
Anion gap: 11 (ref 5–15)
BUN: 44 mg/dL — ABNORMAL HIGH (ref 8–23)
CO2: 23 mmol/L (ref 22–32)
Calcium: 9.2 mg/dL (ref 8.9–10.3)
Chloride: 103 mmol/L (ref 98–111)
Creatinine, Ser: 3.36 mg/dL — ABNORMAL HIGH (ref 0.44–1.00)
GFR, Estimated: 13 mL/min — ABNORMAL LOW (ref >60)
Glucose, Bld: 154 mg/dL — ABNORMAL HIGH (ref 70–99)
Potassium: 4.6 mmol/L (ref 3.5–5.1)
Sodium: 137 mmol/L (ref 135–145)

## 2020-05-13 MED ORDER — TORSEMIDE 20 MG PO TABS
40.0000 mg | ORAL_TABLET | Freq: Every day | ORAL | Status: DC
  Administered 2020-05-14 – 2020-05-15 (×2): 40 mg via ORAL
  Filled 2020-05-13 (×2): qty 2

## 2020-05-13 MED ORDER — FUROSEMIDE 20 MG PO TABS
20.0000 mg | ORAL_TABLET | Freq: Every day | ORAL | Status: DC
  Administered 2020-05-13: 20 mg via ORAL
  Filled 2020-05-13: qty 1

## 2020-05-13 NOTE — Progress Notes (Signed)
PROGRESS NOTE   [name]  [id number] DOB: 06/08/1940 DOA: 05/11/2020 PCP: Susan Hose, MD  Brief Narrative:  80 black female Community dwelling CAD/AS status post TAVR 11/14/2018 Dr. Roxy Kennedy Rheumatoid arthritis CKD 3B Prior high-grade stenosis right coronary artery status post DES PCI 12/2013 For prior strokes including eye strokes and many more TIAs Follows with Dr. Brett Kennedy for sleep studies/RLS and nocturia  Stroke 02-26-20 with recrudescence 02/11/2020 = 5 mm acute infarct-discharged to rehab where she left on 02/26/2020  Patient readmitted from home to [facility] exertional dyspnea X 3 days-recently started by Dr. Einar Kennedy on hydralazine and Imdur and increased from 50-75 3 times daily   CXR on admission showed volume overload-x-ray showed congestion given Lasix 80x1 in ED   Hospital-Problem based course Decompensated acute superimposed on chronic systolic and diastolic heart failure Probable hypertensive urgency Echocardiogram Grd 2 DD-normal structure function of aortic valve prosthesis Diuresis held because of rising creatinine but resumed low-dose Lasix 04/2718 mg Probable cardiorenal syndrome  AKI likely secondary to Lasix Bladder scan performed was negative Baseline GFR is between 13-18 Will probably need planning with regards to dialysis-given the fact that she still has volume on her I will ask nephrology to comment and CC Dr. Einar Kennedy to let him know of her admission Home diuretic Demadex 40 daily Continue calcitriol 0.25 daily She has been told by her nephrologist Dr. Hollie Kennedy in the past that they need to plan for HD CAD status post DES PCI 12/2013 Hypertensive urgency on admission Continue amlodipine 10, hydralazine 50 every 8, Imdur 60, labetalol 200 twice daily-continue aspirin 81 Blood pressure control reasonable at [id number] systolic Multiple recurrent CVAs Continue aspirin and follow-up outpatient Dr. Brett Kennedy Will be coordinating with her regarding  outpatient sleep study etc. etc. as has been seen by patient on 4/25    DVT prophylaxis: Heparin 3 times daily Code Status: Full Family Communication:  Disposition:  Status is: Observation  The patient will require care spanning > 2 midnights and should be moved to inpatient because: Persistent severe electrolyte disturbances, IV treatments appropriate due to intensity of illness or inability to take PO and Inpatient level of care appropriate due to severity of illness  Dispo: The patient is from: Home              Anticipated d/c is to: Home probably with home health              Patient currently is not medically stable to d/c.   Difficult to place patient No       Consultants:   None  Procedures: None  Antimicrobials: No   Subjective:  Feels fair no distress no chest pain no fever no chills has been walking around in the room-does not feel short of breath Tells me she came from her neurologist office after the appointment and felt very winded-tells me also when she climbs into her daughter's jeep or goes up and down she feels quite winded   Objective: Vitals:   05/12/20 1238 05/12/20 1700 05/13/20 0137 05/13/20 0440  BP: (!) 128/46 118/63 (!) 144/52 (!) 142/52  Pulse: (!) 50 (!) 59 (!) 55 (!) 57  Resp: '18 18 18 18  '$ Temp: 97.7 F (36.5 C) 98.5 F (36.9 C) 97.9 F (36.6 C) 97.7 F (36.5 C)  TempSrc: Oral Oral Oral Oral  SpO2: 100% 98% 96% 98%  Weight:    76.6 kg  Height:        Intake/Output Summary (Last 24  hours) at 05/13/2020 1118 Last data filed at 05/12/2020 2249 Gross per 24 hour  Intake 483 ml  Output --  Net 483 ml   Filed Weights   05/11/20 1259 05/13/20 0440  Weight: 79.8 kg 76.6 kg    Examination:  EOMI NCAT JVD elevated to jaw Chest clear no rales no rhonchi Abdomen soft no rebound ROM intact lower extremities She has slight droop to the left side of face her power is 5/5 She is quite hyperreflexic in her major deep tendons Sensory  is intact    Data Reviewed: personally reviewed   Data BUNs/creatinine 42/2.8-->44/3.3 Hemoglobin 10.2, platelet 239  Radiology Studies: DG Chest 1 View  Result Date: 05/12/2020 CLINICAL DATA:  Shortness of breath.  History of CHF. EXAM: CHEST  1 VIEW COMPARISON:  05/11/2020. FINDINGS: Loop recorder noted in stable position. Prior cardiac valve replacement. Cardiomegaly with mild pulmonary venous congestion. Low lung volumes with mild bibasilar atelectasis. No pleural effusion or pneumothorax IMPRESSION: 1. Loop recorder stable position. Prior cardiac valve replacement. Cardiomegaly with mild pulmonary venous congestion. 2.  Low lung volumes with mild bibasilar atelectasis. Electronically Signed   By: Marcello Moores  Register   On: 05/12/2020 06:08   DG Chest 2 View  Result Date: 05/11/2020 CLINICAL DATA:  Shortness of breath EXAM: CHEST - 2 VIEW COMPARISON:  April 23, 2019 FINDINGS: There is cardiomegaly with pulmonary venous hypertension. There is a small right pleural effusion. No edema or airspace opacity. Patient is status post aortic valve replacement. A loop recorder is present on the left anteriorly. No adenopathy. Degenerative change noted in the thoracic spine. There is aortic atherosclerosis. IMPRESSION: Cardiomegaly with pulmonary vascular congestion. Small right pleural effusion. No edema or airspace opacity. Status post aortic valve replacement. Loop recorder on the left. Aortic Atherosclerosis (ICD10-I70.0). Electronically Signed   By: Lowella Grip III [name]   On: 05/11/2020 14:20   ECHOCARDIOGRAM COMPLETE  Result Date: 05/12/2020    ECHOCARDIOGRAM REPORT   Patient Name:   [name] Spicer Date of Exam: 05/12/2020 Medical Rec #:  [id number]     Height:       63.0 in Accession #:    [id number]    Weight:       176.0 lb Date of Birth:  1940/08/02     BSA:          1.831 m Patient Age:    80 years      BP:           123/45 mmHg Patient Gender: F             HR:           55 bpm. Exam Location:   Inpatient Procedure: 2D Echo, Cardiac Doppler and Color Doppler Indications:    CHF  History:        Patient has prior history of Echocardiogram examinations, most                 recent 09/10/2018. CHF, CAD, TAVR 11/13/2018 with 23 mm Edwards                 S3U via TF approach, CVA; Risk Factors:Hypertension,                 Dyslipidemia and Diabetes.                 Aortic Valve: 23 mm Sapien prosthetic, stented (TAVR) valve is  present in the aortic position. Procedure Date: 11/13/2018.  Sonographer:    Maudry Mayhew MHA, RDMS, RVT, RDCS Referring Phys: [id number] Lequita Halt  Sonographer Comments: Image acquisition challenging due to patient body habitus and Image acquisition challenging due to respiratory motion. IMPRESSIONS  1. Left ventricular ejection fraction, by estimation, is 60 to 65%. The left ventricle has normal function. The left ventricle has no regional wall motion abnormalities. Left ventricular diastolic parameters are consistent with Grade II diastolic dysfunction (pseudonormalization). Elevated left atrial pressure.  2. Right ventricular systolic function is normal. The right ventricular size is normal. There is normal pulmonary artery systolic pressure.  3. The mitral valve is normal in structure. Trivial mitral valve regurgitation. No evidence of mitral stenosis.  4. The aortic valve has been repaired/replaced. Aortic valve regurgitation is not visualized. No aortic stenosis is present. There is a 23 mm Sapien prosthetic (TAVR) valve present in the aortic position. Procedure Date: 11/13/2018. Echo findings are consistent with normal structure and function of the aortic valve prosthesis.  5. The inferior vena cava is normal in size with greater than 50% respiratory variability, suggesting right atrial pressure of 3 mmHg. Comparison(s): The left ventricular diastolic function is significantly worse. FINDINGS  Left Ventricle: Left ventricular ejection fraction, by estimation,  is 60 to 65%. The left ventricle has normal function. The left ventricle has no regional wall motion abnormalities. The left ventricular internal cavity size was normal in size. There is  no left ventricular hypertrophy. Left ventricular diastolic parameters are consistent with Grade II diastolic dysfunction (pseudonormalization). Elevated left atrial pressure. Right Ventricle: The right ventricular size is normal. No increase in right ventricular wall thickness. Right ventricular systolic function is normal. There is normal pulmonary artery systolic pressure. The tricuspid regurgitant velocity is 2.04 m/s, and  with an assumed right atrial pressure of 3 mmHg, the estimated right ventricular systolic pressure is [id number] mmHg. Left Atrium: Left atrial size was normal in size. Right Atrium: Right atrial size was normal in size. Pericardium: There is no evidence of pericardial effusion. Mitral Valve: The mitral valve is normal in structure. Mild to moderate mitral annular calcification. Trivial mitral valve regurgitation. No evidence of mitral valve stenosis. Tricuspid Valve: The tricuspid valve is normal in structure. Tricuspid valve regurgitation is not demonstrated. No evidence of tricuspid stenosis. Aortic Valve: The aortic valve has been repaired/replaced. Aortic valve regurgitation is not visualized. No aortic stenosis is present. Aortic valve mean gradient measures 15.0 mmHg. Aortic valve peak gradient measures 28.7 mmHg. Aortic valve area, by VTI measures 0.81 cm. There is a 23 mm Sapien prosthetic, stented (TAVR) valve present in the aortic position. Procedure Date: 11/13/2018. Echo findings are consistent with normal structure and function of the aortic valve prosthesis. Pulmonic Valve: The pulmonic valve was normal in structure. Pulmonic valve regurgitation is not visualized. No evidence of pulmonic stenosis. Aorta: The aortic root is normal in size and structure. Venous: The inferior vena cava is normal in  size with greater than 50% respiratory variability, suggesting right atrial pressure of 3 mmHg. IAS/Shunts: No atrial level shunt detected by color flow Doppler.  LEFT VENTRICLE PLAX 2D LVIDd:         4.40 cm     Diastology LVIDs:         2.80 cm     LV e' medial:    3.70 cm/s LV PW:         1.00 cm     LV E/e' medial:  31.6 LV  IVS:        1.00 cm     LV e' lateral:   7.18 cm/s LVOT diam:     1.60 cm     LV E/e' lateral: 16.3 LV SV:         52 LV SV Index:   29 LVOT Area:     2.01 cm  LV Volumes (MOD) LV vol d, MOD A2C: 65.6 ml LV vol d, MOD A4C: 69.1 ml LV vol s, MOD A2C: 34.4 ml LV vol s, MOD A4C: 24.5 ml LV SV MOD A2C:     31.2 ml LV SV MOD A4C:     69.1 ml LV SV MOD BP:      37.6 ml RIGHT VENTRICLE RV S prime:     8.27 cm/s TAPSE (M-mode): 1.4 cm LEFT ATRIUM             Index       RIGHT ATRIUM           Index LA diam:        3.50 cm 1.91 cm/m  RA Area:     11.90 cm LA Vol (A2C):   75.7 ml 41.33 ml/m RA Volume:   22.30 ml  12.18 ml/m LA Vol (A4C):   58.6 ml 31.97 ml/m LA Biplane Vol: 63.8 ml 34.84 ml/m  AORTIC VALVE AV Area (Vmax):    0.73 cm AV Area (Vmean):   0.76 cm AV Area (VTI):     0.81 cm AV Vmax:           268.00 cm/s AV Vmean:          183.000 cm/s AV VTI:            0.649 m AV Peak Grad:      28.7 mmHg AV Mean Grad:      15.0 mmHg LVOT Vmax:         97.00 cm/s LVOT Vmean:        69.200 cm/s LVOT VTI:          0.260 m LVOT/AV VTI ratio: 0.40 MITRAL VALVE                TRICUSPID VALVE MV Area (PHT): 2.69 cm     TR Peak grad:   16.6 mmHg MV Decel Time: 282 msec     TR Vmax:        204.00 cm/s MR Peak grad: 39.4 mmHg MR Vmax:      314.00 cm/s   SHUNTS MV E velocity: 117.00 cm/s  Systemic VTI:  0.26 m MV A velocity: 104.00 cm/s  Systemic Diam: 1.60 cm MV E/A ratio:  1.12 Mihai Croitoru MD Electronically signed by Sanda Klein MD Signature Date/Time: 05/12/2020/1:00:51 PM    Final      Scheduled Meds: . allopurinol  50 mg Oral Daily  . amLODipine  10 mg Oral Daily  . aspirin EC  81 mg  Oral Daily  . brimonidine  1 drop Left Eye BID  . calcitRIOL  0.25 mcg Oral Daily  . ezetimibe  10 mg Oral Daily  . folic acid  1 mg Oral Daily  . furosemide  20 mg Oral Daily  . heparin  5,000 Units Subcutaneous Q12H  . hydrALAZINE  50 mg Oral Q8H  . insulin aspart  0-9 Units Subcutaneous TID WC  . insulin glargine  12 Units Subcutaneous QHS  . isosorbide mononitrate  60 mg Oral Daily  . labetalol  200 mg Oral BID  .  loratadine  10 mg Oral Daily  . polyethylene glycol  17 g Oral Daily  . potassium chloride  10 mEq Oral Daily  . rosuvastatin  40 mg Oral Daily  . senna-docusate  2 tablet Oral BID  . sodium chloride flush  3 mL Intravenous Q12H  . venlafaxine XR  150 mg Oral Daily   Continuous Infusions: . sodium chloride       LOS: 0 days   Time spent: 40  Nita Sells, MD [facility] To contact the attending provider between 7A-7P or the covering provider during after hours 7P-7A, please log into the web site [email or web address] and access using universal [place] password for that web site. If you do not have the password, please call the hospital operator.  05/13/2020, 11:18 AM

## 2020-05-13 NOTE — Consult Note (Signed)
CARDIOLOGY CONSULT NOTE  Patient ID: [name] MRN: [id number] DOB/AGE: 26-May-1940 80 y.o.  Admit date: 05/11/2020 Referring Physician  Verneita Griffes, MD Primary Physician:  Audley Hose, MD Reason for Consultation  CHF  Patient ID: [name], female    DOB: [birth year], 80 y.o.   MRN: [id number]  Chief Complaint  Patient presents with  . Shortness of Breath   HPI:    [name]  is a 80 y.o. African-American female patient with hypertension, hyperlipidemia, diabetes mellitus with stage III-IV chronic kidney disease, history of stroke in 2015 and again in 2020 S/P Loop recorder implantation, moderate coronary artery disease by cardiac catheterization in 2020, history of TAVR for severe aortic stenosis in number of 2020 is now admitted to the hospital with 3 days of worsening shortness of breath, PND and orthopnea.  Upon admission she was found to be in acute on chronic renal failure.  I am consulted to evaluate for heart failure.  Patient denies chest pain, states that she got acutely short of breath overnight and hence had to come to the emergency room.  Since being in the hospital and with diuresis, she has started to feel better and has responded to diuretics with increasing urine output.  No palpitations, no dizziness or syncope.  Past Medical History:  Diagnosis Date  . Arthritis   . Cerebrovascular disease   . CKD (chronic kidney disease)    Sees Dr Hollie Salk  . Coronary artery disease   . Depression   . Diabetes ([place])    INSULIN DEPENDENT  . Diabetic peripheral neuropathy ([place])   . Diabetic retinopathy ([place])   . Diverticulitis   . GERD (gastroesophageal reflux disease)   . History of CVA (cerebrovascular accident)    x 2 no residulal  . Hyperlipidemia   . Hypertension   . Obesity   . Renal lesion   . S/P TAVR (transcatheter aortic valve replacement) 11/13/2018   s/p TAVR with a 8m Edwards S3U via the TF approach by Dr. ORoxy Mannsand Dr. MAngelena Form . Severe aortic  stenosis    Past Surgical History:  Procedure Laterality Date  . ABDOMINAL HYSTERECTOMY    . AV FISTULA PLACEMENT Left 12/18/2017   Procedure: ARTERIOVENOUS (AV) FISTULA CREATION ARM;  Surgeon: CMarty Heck MD;  [place] [facility]  Service: Vascular;  Laterality: Left;  . bilateral cataract surgery    . CARDIAC CATHETERIZATION  01/07/2014   DR GLansingRESECTION  02/1999  . COLONOSCOPY    . COLOSTOMY  02/1999  . COLOSTOMY CLOSURE  07/1999  . EYE SURGERY Left 2019  . FISTULA SUPERFICIALIZATION Left 06/29/2018   Procedure: FISTULA SUPERFICIALIZATION LEFT BRACHIOCEPHALIC;  Surgeon: CMarty Heck MD;  [place] [facility]  Service: Vascular;  Laterality: Left;  . LEFT HEART CATHETERIZATION WITH CORONARY ANGIOGRAM N/A 01/07/2014   Procedure: LEFT HEART CATHETERIZATION WITH CORONARY ANGIOGRAM;  Surgeon: JLaverda Page MD;  [facility];  Service: Cardiovascular;  Laterality: N/A;  . LOOP RECORDER INSERTION N/A 02/17/2020   Procedure: LOOP RECORDER INSERTION;  Surgeon: KDeboraha Sprang MD;  [facility];  Service: Cardiovascular;  Laterality: N/A;  . middle cerebral artery stent placement Right   . OTHER SURGICAL HISTORY     laser surgery  . PTCA  01/07/2014   DES to RCA    DR GEinar Gip . right knee surgery Right    for infection  . RIGHT/LEFT HEART CATH AND CORONARY ANGIOGRAPHY  N/A 10/23/2018   Procedure: RIGHT/LEFT HEART CATH AND CORONARY ANGIOGRAPHY;  Surgeon: Nigel Mormon, MD;  Location: [place] [facility];  Service: Cardiovascular;  Laterality: N/A;  . TEE WITHOUT CARDIOVERSION N/A 11/13/2018   Procedure: TRANSESOPHAGEAL ECHOCARDIOGRAM (TEE);  Surgeon: Burnell Blanks, MD;  Location: [place];  Service: Open Heart Surgery;  Laterality: N/A;  . TRANSCATHETER AORTIC VALVE REPLACEMENT, TRANSFEMORAL  11/13/2018  . TRANSCATHETER AORTIC VALVE REPLACEMENT, TRANSFEMORAL N/A 11/13/2018   Procedure: TRANSCATHETER AORTIC VALVE REPLACEMENT,  TRANSFEMORAL;  Surgeon: Burnell Blanks, MD;  Location: [place];  Service: Open Heart Surgery;  Laterality: N/A;   Social History   Tobacco Use  . Smoking status: Never Smoker  . Smokeless tobacco: Never Used  Substance Use Topics  . Alcohol use: No    Family History  Problem Relation Age of Onset  . Diabetes Mother   . Heart disease Mother   . Prostate cancer Father   . Hypertension Brother   . Prostate cancer Brother     Marital Sttus: Widowed  ROS  Review of Systems  Constitutional: Positive for malaise/fatigue.  HENT: Negative.   Cardiovascular: Positive for dyspnea on exertion, leg swelling, orthopnea and paroxysmal nocturnal dyspnea. Negative for chest pain.  Respiratory: Negative for sputum production and wheezing.   Endocrine: Negative.   Musculoskeletal: Negative.   Gastrointestinal: Negative.  Negative for melena.  Neurological: Negative.   Psychiatric/Behavioral: Negative.   All other systems reviewed and are negative.  Objective   Vitals with BMI 05/13/2020 05/13/2020 05/13/2020  Height - - -  Weight - 168 lbs 14 oz -  BMI - 123456 -  Systolic [id number]  Diastolic 53 52 52  Pulse 56 57 55    Blood pressure (!) 121/53, pulse (!) 56, temperature 97.9 F (36.6 C), temperature source Oral, resp. rate 16, height '5\' 3"'$  (1.6 m), weight 76.6 kg, SpO2 99 %.    Physical Exam Constitutional:      General: She is not in acute distress.    Appearance: She is well-developed.  HENT:     Head: Atraumatic.  Eyes:     Extraocular Movements: Extraocular movements intact.  Neck:     Vascular: JVD (7-8 cm ) present. No carotid bruit.  Cardiovascular:     Rate and Rhythm: Normal rate and regular rhythm.     Pulses: Intact distal pulses.          Dorsalis pedis pulses are 1+ on the right side and 1+ on the left side.       Posterior tibial pulses are 1+ on the right side and 1+ on the left side.     Heart sounds: S1 normal and S2 normal. Murmur heard.   Harsh  midsystolic murmur is present with a grade of 2/6 at the upper right sternal border radiating to the neck. No gallop.   Pulmonary:     Effort: Pulmonary effort is normal.     Breath sounds: Normal breath sounds.  Abdominal:     General: Bowel sounds are normal.     Palpations: Abdomen is soft.  Musculoskeletal:        General: Swelling present. No tenderness.     Right lower leg: Edema present.     Left lower leg: Edema present.  Skin:    General: Skin is warm.     Capillary Refill: Capillary refill takes less than 2 seconds.  Neurological:     General: No focal deficit present.  Mental Status: She is alert and oriented to person, place, and time.    Laboratory examination:    Recent Labs    05/11/20 1352 05/12/20 0332 05/13/20 0151  NA 138 136 137  K 4.4 4.5 4.6  CL 105 102 103  CO2 '24 23 23  '$ GLUCOSE 143* 269* 154*  BUN 42* 42* 44*  CREATININE 2.82* 3.20* 3.36*  CALCIUM 9.3 9.0 9.2  GFRNONAA 16* 14* 13*   estimated creatinine clearance is 12.9 mL/min (A) (by C-G formula based on SCr of 3.36 mg/dL (H)).  CMP Latest Ref Rng & Units 05/13/2020 05/12/2020 05/11/2020  Glucose 70 - 99 mg/dL 154(H) 269(H) 143(H)  BUN 8 - 23 mg/dL 44(H) 42(H) 42(H)  Creatinine 0.44 - 1.00 mg/dL 3.36(H) 3.20(H) 2.82(H)  Sodium 135 - 145 mmol/L 137 136 138  Potassium 3.5 - 5.1 mmol/L 4.6 4.5 4.4  Chloride 98 - 111 mmol/L 103 102 105  CO2 22 - 32 mmol/L '23 23 24  '$ Calcium 8.9 - 10.3 mg/dL 9.2 9.0 9.3  Total Protein 6.5 - 8.1 g/dL - - 6.3(L)  Total Bilirubin 0.3 - 1.2 mg/dL - - 0.4  Alkaline Phos 38 - 126 U/L - - 72  AST 15 - 41 U/L - - 27  ALT 0 - 44 U/L - - 19   CBC Latest Ref Rng & Units 05/11/2020 02/24/2020 02/18/2020  WBC 4.0 - 10.5 K/uL 9.8 8.1 8.5  Hemoglobin 12.0 - 15.0 g/dL 10.2(L) 10.2(L) 10.4(L)  Hematocrit 36.0 - 46.0 % 31.7(L) 31.8(L) 30.8(L)  Platelets 150 - 400 K/uL 239 228 173   Lipid Panel Recent Labs    02/08/20 1924 02/11/20 1625  CHOL 289* 296*  TRIG 140 148   LDLCALC 185* 199*  VLDL 28 30  HDL 76 67  CHOLHDL 3.8 4.4    HEMOGLOBIN A1C Lab Results  Component Value Date   HGBA1C 7.3 (H) 05/11/2020   MPG 162.81 05/11/2020   TSH Recent Labs    05/11/20 2105  TSH 3.147   BNP (last 3 results) Recent Labs    05/11/20 1352  BNP 302.7*      Component Ref Range & Units 2 d ago  (05/11/20) 3 mo ago  (02/08/20) 3 mo ago  (02/08/20)  Troponin I (High Sensitivity) <18 ng/L 23High  116High Panic CM  109High Panic CM      I/O last 3 completed shifts: In: 36 [P.O.:600; I.V.:3] Out: 2300 [Urine:2300] Total I/O In: 480 [P.O.:480] Out: 500 [Urine:500]    Intake/Output Summary (Last 24 hours) at 05/13/2020 1816 Last data filed at 05/13/2020 1720 Gross per 24 hour  Intake 483 ml  Output 500 ml  Net -17 ml    Medications and allergies  No Known Allergies   Current Meds  Medication Sig  . acetaminophen (TYLENOL) 325 MG tablet Take 650 mg by mouth every 6 (six) hours as needed for moderate pain or headache.   . allopurinol (ZYLOPRIM) 100 MG tablet Take 50 mg by mouth daily.  Marland Kitchen amLODipine (NORVASC) 10 MG tablet TAKE 1 TABLET BY MOUTH EVERY DAY (Patient taking differently: Take 10 mg by mouth daily.)  . aspirin EC 81 MG tablet Take 1 tablet (81 mg total) by mouth daily. Swallow whole.  . brimonidine (ALPHAGAN) 0.2 % ophthalmic solution Place into the left eye once.  . calcitRIOL (ROCALTROL) 0.25 MCG capsule Take 0.25 mcg by mouth daily.   . cetirizine (ZYRTEC) 10 MG tablet Take 10 mg by mouth daily as needed for allergies.  Marland Kitchen  ezetimibe (ZETIA) 10 MG tablet Take 10 mg by mouth daily.  . folic acid (FOLVITE) 1 MG tablet Take 1 mg by mouth daily.  Marland Kitchen HUMALOG KWIKPEN 100 UNIT/ML KwikPen Inject 2-8 Units into the skin 3 (three) times daily before meals.   . hydrALAZINE (APRESOLINE) 50 MG tablet Take 1 tablet (50 mg total) by mouth every 8 (eight) hours.  . insulin glargine (LANTUS) 100 UNIT/ML injection Inject 0.12 mLs (12 Units total)  into the skin at bedtime. (Patient taking differently: Inject 10 Units into the skin at bedtime.)  . isosorbide mononitrate (IMDUR) 30 MG 24 hr tablet Take 1 tablet (30 mg total) by mouth daily.  Marland Kitchen labetalol (NORMODYNE) 200 MG tablet Take 1 tablet (200 mg total) by mouth 2 (two) times daily.  . polyethylene glycol (MIRALAX / GLYCOLAX) 17 g packet Take 17 g by mouth daily. (Patient taking differently: Take 17 g by mouth daily as needed for moderate constipation.)  . polyvinyl alcohol (LIQUIFILM TEARS) 1.4 % ophthalmic solution Place 1 drop into both eyes 3 (three) times daily as needed for dry eyes.  . potassium chloride (KLOR-CON) 10 MEQ tablet Take 1 tablet (10 mEq total) by mouth daily.  . rosuvastatin (CRESTOR) 40 MG tablet Take 1 tablet (40 mg total) by mouth daily.  Marland Kitchen senna-docusate (SENOKOT-S) 8.6-50 MG tablet Take 2 tablets by mouth 2 (two) times daily.  Marland Kitchen torsemide (DEMADEX) 20 MG tablet Take 2 tablets (40 mg total) by mouth daily.  Marland Kitchen venlafaxine XR (EFFEXOR-XR) 150 MG 24 hr capsule Take 1 capsule (150 mg total) by mouth daily.    Scheduled Meds: . allopurinol  50 mg Oral Daily  . amLODipine  10 mg Oral Daily  . aspirin EC  81 mg Oral Daily  . brimonidine  1 drop Left Eye BID  . calcitRIOL  0.25 mcg Oral Daily  . ezetimibe  10 mg Oral Daily  . folic acid  1 mg Oral Daily  . heparin  5,000 Units Subcutaneous Q12H  . hydrALAZINE  50 mg Oral Q8H  . insulin aspart  0-9 Units Subcutaneous TID WC  . insulin glargine  12 Units Subcutaneous QHS  . isosorbide mononitrate  60 mg Oral Daily  . labetalol  200 mg Oral BID  . polyethylene glycol  17 g Oral Daily  . rosuvastatin  40 mg Oral Daily  . senna-docusate  2 tablet Oral BID  . sodium chloride flush  3 mL Intravenous Q12H  . [START ON 05/14/2020] torsemide  40 mg Oral Daily  . venlafaxine XR  150 mg Oral Daily   Continuous Infusions: . sodium chloride     PRN Meds:.sodium chloride, acetaminophen, ondansetron (ZOFRAN) IV, polyvinyl  alcohol, sodium chloride flush   I/O last 3 completed shifts: In: 59 [P.O.:600; I.V.:3] Out: 2300 [Urine:2300] Total I/O In: 480 [P.O.:480] Out: 500 [Urine:500]    Radiology:   DG Chest 1 View Result Date: 05/12/2020 CLINICAL DATA:  Shortness of breath.  History of CHF. EXAM: CHEST  1 VIEW COMPARISON:  05/11/2020. FINDINGS: Loop recorder noted in stable position. Prior cardiac valve replacement. Cardiomegaly with mild pulmonary venous congestion. Low lung volumes with mild bibasilar atelectasis. No pleural effusion or pneumothorax  IMPRESSION: 1. Loop recorder stable position. Prior cardiac valve replacement. Cardiomegaly with mild pulmonary venous congestion. 2.  Low lung volumes with mild bibasilar atelectasis. Electronically Signed   By: Marcello Moores  Register   On: 05/12/2020 06:08   Cardiac Studies:   Right and left heart cath 10/23/18:  RA: 4 mmHg RV: 54/9 mmHg PA: 54/17 mmHg, mean PAP 34 mmhg. PCWP: 25 mmHg with tall V wave  CO: 5.2 L/min CI: 3 L/min/m2  LM: Normal LAD: Normal LCx:: Normal RCA: Ostial calcific at least 60% stenosis. Ostial RPDA 20% stenosis.   Right coronary artery was difficult to engage. I had to use multiple catheters, and was finally able to engage with [facility] catheter. there was dampnening of the pressure with catheter engagement. Ostium is at least 60% stenosed with moderate calcification. There was no signifciant improvement with IC NTG. I did not perform   Lexiscan (Walking with mod Bruce)Tetrofosmin Stress Test 06/25/2019: Nondiagnostic ECG stress. The baseline blood pressure was 130/80 mmHg. Maximum blood pressure post injection was 200/70 mmHg, which is a hypertensive response to Lexiscan with Treadmill.  Myocardial perfusion is normal. Overall LV systolic function is normal without regional wall motion abnormalities. Stress LV EF: 61%.  No significant change from 08/06/2018. Low risk.   Echocardiogram 05/12/2020:   1. Left ventricular  ejection fraction, by estimation, is 60 to 65%. The left ventricle has normal function. The left ventricle has no regional wall motion abnormalities. Left ventricular diastolic parameters are consistent with Grade II diastolic  dysfunction (pseudonormalization). Elevated left atrial pressure.  2. Right ventricular systolic function is normal. The right ventricular size is normal. There is normal pulmonary artery systolic pressure.  3. The mitral valve is normal in structure. Trivial mitral valve regurgitation. No evidence of mitral stenosis.  4. The aortic valve has been repaired/replaced. Aortic valve regurgitation is not visualized. No aortic stenosis is present. There is a 23 mm Sapien prosthetic (TAVR) valve present in the aortic position. Procedure Date: 11/13/2018. Echo findings are  consistent with normal structure and function of the aortic valve prosthesis.  5. The inferior vena cava is normal in size with greater than 50% respiratory variability, suggesting right atrial pressure of 3 mmHg.  Comparison(s): The left ventricular diastolic function is significantly worse.   EKG:   EKG 05/11/2020: Sinus rhythm with first-degree block at the rate of 58 bpm, left atrial enlargement, normal axis.  Incomplete right bundle branch block.  Poor R wave progression, cannot exclude anteroseptal infarct 4.  Diffuse nonspecific T abnormality, cannot exclude inferior ischemia.  Low-voltage complexes.  Abnormal EKG.  Compared to 02/15/2020 and 01/02/2020, inferior ST-T wave changes new.  Assessment   [name]  is a 80 y.o. African-American female patient with hypertension, hyperlipidemia, diabetes mellitus with stage III-IV chronic kidney disease, history of stroke in 2015 and again in 2020 S/P Loop recorder implantation, moderate coronary artery disease by cardiac catheterization in 2020, history of TAVR for severe aortic stenosis in November of 2020 is now admitted to the hospital with 3 days of  worsening shortness of breath, PND and orthopnea.  Upon admission she was found to be in acute on chronic renal failure.  I am consulted to evaluate for heart failure.  1.  Acute on chronic diastolic heart failure 2.  Stage IV-V chronic kidney disease, acute on chronic kidney failure. 3.  History of TAVR, 23 mm Sapien prosthetic (TAVR) in November 2020, echocardiogram.'s normal functioning aortic valve. 4. DM controlled with complications without hyperglycemia with stage 4 CKD   Recommendations:   Patient has started to respond to IV diuretics.  Nephrology has been consulted already.  Urine output appears to be around 500 cc a day, she is gradually started to improve with regard to leg edema and also dyspnea and orthopnea.  Continue  the same for now.  Suspect she will end up probably needing dialysis soon.  She is on appropriate medical therapy including beta-blockers, statins for hyperlipidemia and CAD, unable to use ACE inhibitor's or ARB due to underlying renal failure.  Do not think she needs any further cardiac work-up and suspect cardiorenal syndrome leading to acute on chronic renal failure which is complicated by diabetes mellitus.   Adrian Prows, MD, [facility] 05/13/2020, 6:02 PM Office: [phone]

## 2020-05-13 NOTE — Consult Note (Addendum)
[facility] Nephrology Consultation Note  Requesting MD: Dr Verlon Au, Lenna Sciara Reason for consult: CKD4/5  HPI:  KAMORRA ALLERY is a 80 y.o. female with history of hypertension, diabetes, obesity, aortic stenosis status post TAVR, CAD, stroke, CKD stage IV/V presented with shortness of breath seen as a consultation for the management of CKD at the request of Dr. Verlon Au. Patient has longstanding CKD stage IV-V and follows closely at our office.  The creatinine level was peaked to 3.8 in 2020 and she underwent left upper extremity brachiocephalic AV fistula created by Dr. Carlis Abbott.  The CKD was thought to be due to diabetes and hypertension and recently her baseline creatinine level around 2.5-3.2.  She has a chronic nonnephrotic range proteinuria. In the ER, she was initially hypertensive however remained in room air.  The creatinine level was 2.82.  The chest x-ray with mild pulmonary vascular congestion and atelectasis.  BNP not much elevated.  She received IV Lasix 80 mg and was switched to oral Lasix 20 mg by primary team. Today, the labs showed creatinine level of 3.36, potassium 4.6.  Patient reports feeling much better with significant improvement in shortness of breath.  Urine output 700 cc recorded.  She denies nausea, vomiting, dysgeusia, chest pain, headache, dizziness.  Blood pressure is better controlled.  Creatinine, Ser  Date/Time Value Ref Range Status  05/13/2020 01:51 AM 3.36 (H) 0.44 - 1.00 mg/dL Final  05/12/2020 03:32 AM 3.20 (H) 0.44 - 1.00 mg/dL Final  05/11/2020 01:52 PM 2.82 (H) 0.44 - 1.00 mg/dL Final  02/26/2020 05:10 AM 2.75 (H) 0.44 - 1.00 mg/dL Final     PMHx:   Past Medical History:  Diagnosis Date  . Arthritis   . Cerebrovascular disease   . CKD (chronic kidney disease)    Sees Dr Hollie Salk  . Coronary artery disease   . Depression   . Diabetes ([place])    INSULIN DEPENDENT  . Diabetic peripheral neuropathy ([place])   . Diabetic retinopathy ([place])   .  Diverticulitis   . GERD (gastroesophageal reflux disease)   . History of CVA (cerebrovascular accident)    x 2 no residulal  . Hyperlipidemia   . Hypertension   . Obesity   . Renal lesion   . S/P TAVR (transcatheter aortic valve replacement) 11/13/2018   s/p TAVR with a 53m Edwards S3U via the TF approach by Dr. ORoxy Mannsand Dr. MAngelena Form . Severe aortic stenosis     Past Surgical History:  Procedure Laterality Date  . ABDOMINAL HYSTERECTOMY    . AV FISTULA PLACEMENT Left 12/18/2017   Procedure: ARTERIOVENOUS (AV) FISTULA CREATION ARM;  Surgeon: CMarty Heck MD;  [facility] [facility]  Service: Vascular;  Laterality: Left;  . bilateral cataract surgery    . CARDIAC CATHETERIZATION  01/07/2014   [facility]  02[facility]  . COLONOSCOPY    . COLOSTOMY  02/1999  . COLOSTOMY CLOSURE  07/1999  . EYE SURGERY Left 2019  . FISTULA SUPERFICIALIZATION Left 06/29/2018   Procedure: FISTULA SUPERFICIALIZATION LEFT BRACHIOCEPHALIC;  Surgeon: CMarty Heck MD;  [facility] [facility]  Service: Vascular;  Laterality: Left;  . LEFT HEART CATHETERIZATION WITH CORONARY ANGIOGRAM N/A 01/07/2014   Procedure: LEFT HEART CATHETERIZATION WITH CORONARY ANGIOGRAM;  Surgeon: JLaverda Page MD;  [facility];  Service: Cardiovascular;  Laterality: N/A;  . LOOP RECORDER INSERTION N/A 02/17/2020   Procedure: LOOP RECORDER INSERTION;  Surgeon: KDeboraha Sprang MD;  [facility];  Service: Cardiovascular;  Laterality: N/A;  . middle cerebral artery stent placement Right   . OTHER SURGICAL HISTORY     laser surgery  . PTCA  01/07/2014   DES to RCA    DR Einar Gip  . right knee surgery Right    for infection  . RIGHT/LEFT HEART CATH AND CORONARY ANGIOGRAPHY N/A 10/23/2018   Procedure: RIGHT/LEFT HEART CATH AND CORONARY ANGIOGRAPHY;  Surgeon: Nigel Mormon, MD;  Location: [facility] CV LAB;  Service: Cardiovascular;  Laterality: N/A;  . TEE WITHOUT CARDIOVERSION N/A  11/13/2018   Procedure: TRANSESOPHAGEAL ECHOCARDIOGRAM (TEE);  Surgeon: Burnell Blanks, MD;  Location: [facility];  Service: Open Heart Surgery;  Laterality: N/A;  . TRANSCATHETER AORTIC VALVE REPLACEMENT, TRANSFEMORAL  11/13/2018  . TRANSCATHETER AORTIC VALVE REPLACEMENT, TRANSFEMORAL N/A 11/13/2018   Procedure: TRANSCATHETER AORTIC VALVE REPLACEMENT, TRANSFEMORAL;  Surgeon: Burnell Blanks, MD;  Location: [place];  Service: Open Heart Surgery;  Laterality: N/A;    Family Hx:  Family History  Problem Relation Age of Onset  . Diabetes Mother   . Heart disease Mother   . Prostate cancer Father   . Hypertension Brother   . Prostate cancer Brother     Social History:  reports that she has never smoked. She has never used smokeless tobacco. She reports that she does not drink alcohol and does not use drugs.  Allergies: No Known Allergies  Medications: Prior to Admission medications   Medication Sig Start Date End Date Taking? Authorizing Provider  acetaminophen (TYLENOL) 325 MG tablet Take 650 mg by mouth every 6 (six) hours as needed for moderate pain or headache.    Yes [provider]  allopurinol (ZYLOPRIM) 100 MG tablet Take 50 mg by mouth daily.   Yes [provider]  amLODipine (NORVASC) 10 MG tablet TAKE 1 TABLET BY MOUTH EVERY DAY Patient taking differently: Take 10 mg by mouth daily. 11/11/19  Yes Patwardhan, Reynold Bowen, MD  aspirin EC 81 MG tablet Take 1 tablet (81 mg total) by mouth daily. Swallow whole. 02/17/20 02/16/21 Yes Katsadouros, Vasilios, MD  brimonidine (ALPHAGAN) 0.2 % ophthalmic solution Place into the left eye once. 04/19/19  Yes [provider]  calcitRIOL (ROCALTROL) 0.25 MCG capsule Take 0.25 mcg by mouth daily.    Yes [provider]  cetirizine (ZYRTEC) 10 MG tablet Take 10 mg by mouth daily as needed for allergies.   Yes [provider]  ezetimibe (ZETIA) 10 MG tablet Take 10 mg by mouth daily. 04/22/20  Yes  [provider]  folic acid (FOLVITE) 1 MG tablet Take 1 mg by mouth daily. 12/07/12  Yes [provider]  HUMALOG KWIKPEN 100 UNIT/ML KwikPen Inject 2-8 Units into the skin 3 (three) times daily before meals.  05/28/18  Yes [provider]  hydrALAZINE (APRESOLINE) 50 MG tablet Take 1 tablet (50 mg total) by mouth every 8 (eight) hours. 02/26/20  Yes Love, Ivan Anchors, PA-C  insulin glargine (LANTUS) 100 UNIT/ML injection Inject 0.12 mLs (12 Units total) into the skin at bedtime. Patient taking differently: Inject 10 Units into the skin at bedtime. 02/26/20  Yes Love, Ivan Anchors, PA-C  isosorbide mononitrate (IMDUR) 30 MG 24 hr tablet Take 1 tablet (30 mg total) by mouth daily. 02/27/20  Yes Love, Ivan Anchors, PA-C  labetalol (NORMODYNE) 200 MG tablet Take 1 tablet (200 mg total) by mouth 2 (two) times daily. 02/26/20  Yes Love, Ivan Anchors, PA-C  polyethylene glycol (MIRALAX / GLYCOLAX)  17 g packet Take 17 g by mouth daily. Patient taking differently: Take 17 g by mouth daily as needed for moderate constipation. 02/27/20  Yes Love, Ivan Anchors, PA-C  polyvinyl alcohol (LIQUIFILM TEARS) 1.4 % ophthalmic solution Place 1 drop into both eyes 3 (three) times daily as needed for dry eyes.   Yes [provider]  potassium chloride (KLOR-CON) 10 MEQ tablet Take 1 tablet (10 mEq total) by mouth daily. 02/27/20  Yes Love, Ivan Anchors, PA-C  rosuvastatin (CRESTOR) 40 MG tablet Take 1 tablet (40 mg total) by mouth daily. 02/26/20  Yes Love, Ivan Anchors, PA-C  senna-docusate (SENOKOT-S) 8.6-50 MG tablet Take 2 tablets by mouth 2 (two) times daily. 02/26/20  Yes Love, Ivan Anchors, PA-C  torsemide (DEMADEX) 20 MG tablet Take 2 tablets (40 mg total) by mouth daily. 02/26/20  Yes Love, Ivan Anchors, PA-C  venlafaxine XR (EFFEXOR-XR) 150 MG 24 hr capsule Take 1 capsule (150 mg total) by mouth daily. 02/27/20  Yes Love, Ivan Anchors, PA-C  nitroGLYCERIN (NITROSTAT) 0.4 MG SL tablet PLACE 1 TABLET (0.4 MG TOTAL) UNDER THE TONGUE  EVERY 5 MINUTES X 3 DOSES AS NEEDED FOR CHEST PAIN. Patient taking differently: Place 0.4 mg under the tongue every 5 (five) minutes as needed for chest pain. 01/03/19   Miquel Dunn, NP  ONE TOUCH ULTRA TEST test strip 1 each by Other route 3 (three) times daily between meals. 12/18/12   [provider]    I have reviewed the patient's current medications.  Labs:  Results for orders placed or performed during the hospital encounter of 05/11/20 (from the past 48 hour(s))  CBC     Status: Abnormal   Collection Time: 05/11/20  1:52 PM  Result Value Ref Range   WBC 9.8 4.0 - 10.5 K/uL   RBC 3.55 (L) 3.87 - 5.11 MIL/uL   Hemoglobin 10.2 (L) 12.0 - 15.0 g/dL   HCT 31.7 (L) 36.0 - 46.0 %   MCV 89.3 80.0 - 100.0 fL   MCH 28.7 26.0 - 34.0 pg   MCHC 32.2 30.0 - 36.0 g/dL   RDW 14.4 11.5 - 15.5 %   Platelets 239 150 - 400 K/uL   nRBC 0.0 0.0 - 0.2 %    Comment: Performed at [facility]  Comprehensive metabolic panel     Status: Abnormal   Collection Time: 05/11/20  1:52 PM  Result Value Ref Range   Sodium 138 135 - 145 mmol/L   Potassium 4.4 3.5 - 5.1 mmol/L   Chloride 105 98 - 111 mmol/L   CO2 24 22 - 32 mmol/L   Glucose, Bld 143 (H) 70 - 99 mg/dL    Comment: Glucose reference range applies only to samples taken after fasting for at least 8 hours.   BUN 42 (H) 8 - 23 mg/dL   Creatinine, Ser 2.82 (H) 0.44 - 1.00 mg/dL   Calcium 9.3 8.9 - 10.3 mg/dL   Total Protein 6.3 (L) 6.5 - 8.1 g/dL   Albumin 3.7 3.5 - 5.0 g/dL   AST 27 15 - 41 U/L   ALT 19 0 - 44 U/L   Alkaline Phosphatase 72 38 - 126 U/L   Total Bilirubin 0.4 0.3 - 1.2 mg/dL   GFR, Estimated 16 (L) >60 mL/min    Comment: (NOTE) Calculated using the CKD-EPI Creatinine Equation (2021)    Anion gap 9 5 - 15    Comment: Performed at [facility]  Troponin I (  High Sensitivity)     Status: Abnormal   Collection Time: 05/11/20  1:52 PM  Result Value Ref Range   Troponin I (High  Sensitivity) 23 (H) <18 ng/L    Comment: (NOTE) Elevated high sensitivity troponin I (hsTnI) values and significant  changes across serial measurements may suggest ACS but many other  chronic and acute conditions are known to elevate hsTnI results.  Refer to the "Links" section for chest pain algorithms and additional  guidance. Performed at [facility]   Brain natriuretic peptide     Status: Abnormal   Collection Time: 05/11/20  1:52 PM  Result Value Ref Range   B Natriuretic Peptide 302.7 (H) 0.0 - 100.0 pg/mL    Comment: Performed at [facility]  Resp Panel by RT-PCR (Flu A&B, Covid) Nasopharyngeal Swab     Status: None   Collection Time: 05/11/20  3:31 PM   Specimen: Nasopharyngeal Swab; Nasopharyngeal(NP) swabs in vial transport medium  Result Value Ref Range   SARS Coronavirus 2 by RT PCR NEGATIVE NEGATIVE    Comment: (NOTE) SARS-CoV-2 target nucleic acids are NOT DETECTED.  The SARS-CoV-2 RNA is generally detectable in upper respiratory specimens during the acute phase of infection. The lowest concentration of SARS-CoV-2 viral copies this assay can detect is 138 copies/mL. A negative result does not preclude SARS-Cov-2 infection and should not be used as the sole basis for treatment or other patient management decisions. A negative result may occur with  improper specimen collection/handling, submission of specimen other than nasopharyngeal swab, presence of viral mutation(s) within the areas targeted by this assay, and inadequate number of viral copies(<138 copies/mL). A negative result must be combined with clinical observations, patient history, and epidemiological information. The expected result is Negative.  Fact Sheet for Patients:  EntrepreneurPulse.com.au  Fact Sheet for Healthcare Providers:  IncredibleEmployment.be  This test is no t yet approved or cleared by the [place] FDA and   has been authorized for detection and/or diagnosis of SARS-CoV-2 by FDA under an Emergency Use Authorization (EUA). This EUA will remain  in effect (meaning this test can be used) for the duration of the COVID-19 declaration under Section 564(b)(1) of the Act, 21 U.S.C.section 360bbb-3(b)(1), unless the authorization is terminated  or revoked sooner.       Influenza A by PCR NEGATIVE NEGATIVE   Influenza B by PCR NEGATIVE NEGATIVE    Comment: (NOTE) The Xpert Xpress SARS-CoV-2/FLU/RSV plus assay is intended as an aid in the diagnosis of influenza from Nasopharyngeal swab specimens and should not be used as a sole basis for treatment. Nasal washings and aspirates are unacceptable for Xpert Xpress SARS-CoV-2/FLU/RSV testing.  Fact Sheet for Patients: EntrepreneurPulse.com.au  Fact Sheet for Healthcare Providers: IncredibleEmployment.be  This test is not yet approved or cleared by the [place] FDA and has been authorized for detection and/or diagnosis of SARS-CoV-2 by FDA under an Emergency Use Authorization (EUA). This EUA will remain in effect (meaning this test can be used) for the duration of the COVID-19 declaration under Section 564(b)(1) of the Act, 21 U.S.C. section 360bbb-3(b)(1), unless the authorization is terminated or revoked.  Performed at [facility]   Troponin I (High Sensitivity)     Status: Abnormal   Collection Time: 05/11/20  7:04 PM  Result Value Ref Range   Troponin I (High Sensitivity) 27 (H) <18 ng/L    Comment: (NOTE) Elevated high sensitivity troponin I (hsTnI) values and significant  changes across serial  measurements may suggest ACS but many other  chronic and acute conditions are known to elevate hsTnI results.  Refer to the "Links" section for chest pain algorithms and additional  guidance. Performed at [facility], [place] [address] [place], [place] [address]   Hemoglobin A1c      Status: Abnormal   Collection Time: 05/11/20  9:05 PM  Result Value Ref Range   Hgb A1c MFr Bld 7.3 (H) 4.8 - 5.6 %    Comment: (NOTE) Pre diabetes:          5.7%-6.4%  Diabetes:              >6.4%  Glycemic control for   <7.0% adults with diabetes    Mean Plasma Glucose 162.81 mg/dL    Comment: Performed at [facility] [address] [place], [place] [address]  TSH     Status: None   Collection Time: 05/11/20  9:05 PM  Result Value Ref Range   TSH 3.147 0.350 - 4.500 uIU/mL    Comment: Performed by a 3rd Generation assay with a functional sensitivity of <=0.01 uIU/mL. Performed at [facility], [place] [address] [place], [place] [address]   Glucose, capillary     Status: Abnormal   Collection Time: 05/11/20  9:23 PM  Result Value Ref Range   Glucose-Capillary 152 (H) 70 - 99 mg/dL    Comment: Glucose reference range applies only to samples taken after fasting for at least 8 hours.  Basic metabolic panel     Status: Abnormal   Collection Time: 05/12/20  3:32 AM  Result Value Ref Range   Sodium 136 135 - 145 mmol/L   Potassium 4.5 3.5 - 5.1 mmol/L   Chloride 102 98 - 111 mmol/L   CO2 23 22 - 32 mmol/L   Glucose, Bld 269 (H) 70 - 99 mg/dL    Comment: Glucose reference range applies only to samples taken after fasting for at least 8 hours.   BUN 42 (H) 8 - 23 mg/dL   Creatinine, Ser 3.20 (H) 0.44 - 1.00 mg/dL   Calcium 9.0 8.9 - 10.3 mg/dL   GFR, Estimated 14 (L) >60 mL/min    Comment: (NOTE) Calculated using the CKD-EPI Creatinine Equation (2021)    Anion gap 11 5 - 15    Comment: Performed at [facility] [address], [place], [place] [address]  Glucose, capillary     Status: Abnormal   Collection Time: 05/12/20  6:03 AM  Result Value Ref Range   Glucose-Capillary 221 (H) 70 - 99 mg/dL    Comment: Glucose reference range applies only to samples taken after fasting for at least 8 hours.  Glucose, capillary     Status: Abnormal   Collection Time:  05/12/20 11:27 AM  Result Value Ref Range   Glucose-Capillary 125 (H) 70 - 99 mg/dL    Comment: Glucose reference range applies only to samples taken after fasting for at least 8 hours.  Glucose, capillary     Status: Abnormal   Collection Time: 05/12/20  4:22 PM  Result Value Ref Range   Glucose-Capillary 153 (H) 70 - 99 mg/dL    Comment: Glucose reference range applies only to samples taken after fasting for at least 8 hours.  Glucose, capillary     Status: Abnormal   Collection Time: 05/12/20 10:03 PM  Result Value Ref Range   Glucose-Capillary 131 (H) 70 - 99 mg/dL    Comment:  Glucose reference range applies only to samples taken after fasting for at least 8 hours.  Basic metabolic panel     Status: Abnormal   Collection Time: 05/13/20  1:51 AM  Result Value Ref Range   Sodium 137 135 - 145 mmol/L   Potassium 4.6 3.5 - 5.1 mmol/L   Chloride 103 98 - 111 mmol/L   CO2 23 22 - 32 mmol/L   Glucose, Bld 154 (H) 70 - 99 mg/dL    Comment: Glucose reference range applies only to samples taken after fasting for at least 8 hours.   BUN 44 (H) 8 - 23 mg/dL   Creatinine, Ser 3.36 (H) 0.44 - 1.00 mg/dL   Calcium 9.2 8.9 - 10.3 mg/dL   GFR, Estimated 13 (L) >60 mL/min    Comment: (NOTE) Calculated using the CKD-EPI Creatinine Equation (2021)    Anion gap 11 5 - 15    Comment: Performed at [facility] [address] [place], [place] [address]  Glucose, capillary     Status: Abnormal   Collection Time: 05/13/20  6:09 AM  Result Value Ref Range   Glucose-Capillary 129 (H) 70 - 99 mg/dL    Comment: Glucose reference range applies only to samples taken after fasting for at least 8 hours.  Glucose, capillary     Status: Abnormal   Collection Time: 05/13/20 11:15 AM  Result Value Ref Range   Glucose-Capillary 116 (H) 70 - 99 mg/dL    Comment: Glucose reference range applies only to samples taken after fasting for at least 8 hours.   Comment 1 Notify RN      ROS:  Pertinent  items noted in HPI and remainder of comprehensive ROS otherwise negative.  Physical Exam: Vitals:   05/13/20 0440 05/13/20 1217  BP: (!) 142/52 (!) 121/53  Pulse: (!) 57 (!) 56  Resp: 18 16  Temp: 97.7 F (36.5 C) 97.9 F (36.6 C)  SpO2: 98% 99%     General exam: Appears calm and comfortable  Respiratory system: Minimal basal rhonchi otherwise clear lungs, no increased work of breathing. Cardiovascular system: S1 & S2 heard, RRR, no rubs.  No pedal edema. Gastrointestinal system: Abdomen is nondistended, soft and nontender. Normal bowel sounds heard. Central nervous system: Alert and oriented. No focal neurological deficits. Extremities: Symmetric 5 x 5 power. Skin: No rashes, lesions or ulcers Psychiatry: Judgement and insight appear normal. Mood & affect appropriate.  Vascular: left AVF has bruit+, thrill+, mature well.   Assessment/Plan:  # CKD stage IV/V: Longstanding CKD thought to be due to diabetes and hypertension and follows closely at [facility].  Status post left AV fistula created in 2020 and ready to use when needed.  She used to have elevated creatinine level around mid 3s however, recently the creatinine level is slightly better around 2.5- 3.3 after the TAVR.  On presentation, it sounds like she had mild fluid overload which was treated with IV diuretics.  Currently, she is on room air without any respiratory symptoms.  I think she is close to euvolemia and does not have any uremic symptoms to warrant urgent dialysis.  I will discontinue Lasix 20 mg and switch to p.o. torsemide 40 mg daily. Repeat lab in the morning.  #Acute on chronic diastolic CHF: EF at 60 to 65% with grade 2 diastolic dysfunction.  Changing to torsemide 40.  Continue cardiac medications.  #Hypertension: Currently on amlodipine, Imdur, hydralazine, labetalol in addition to diuretics.  Monitor blood pressure.  Avoid hypotensive episode.  #Anemia of CKD: Hemoglobin at  goal.  #CKD-MBD/secondary hyperparathyroidism: Check phosphorus level.  Calcium level acceptable.  Continue calcitriol.  Thank you for the consult, we will follow with you.  Linden Mikes Tanna Furry 05/13/2020, 1:44 PM  [employer].

## 2020-05-13 NOTE — Hospital Course (Signed)
63 black female [place] dwelling CAD/AS status post TAVR 11/14/2018 Dr. [name] Rheumatoid arthritis CKD 3B Prior high-grade stenosis right coronary artery status post DES PCI 12/2013 For prior strokes including eye strokes and many more TIAs Follows with Dr. Brett Fairy for sleep studies/RLS and nocturia  Stroke 2020-02-22 with recrudescence 02/11/2020 = 5 mm acute infarct-discharged to rehab where she left on 02/26/2020  Patient readmitted from home to [facility] exertional dyspnea X 3 days-recently started by Dr. Einar Gip on hydralazine and Imdur and increased from 50-75 3 times daily  CXR on admission showed volume overload-x-ray showed congestion given Lasix 80x1 in ED  Data BUNs/creatinine 42/2.8-->44/3.3 Hemoglobin 10.2, platelet 239

## 2020-05-13 NOTE — Evaluation (Signed)
Physical Therapy Evaluation Patient Details Name: [name] MRN: [id number] DOB: 10-21-1940 Today's Date: 05/13/2020   History of Present Illness  Pt is an 80 y/o female admitted 4/25 secondary to worsening SOB. Thought to be secondary to dCHF exacerbation. Pt also with AKI. PMH includes HTN, DM, s/p TAVR, CAD, CVA, and CKD.  Clinical Impression  Pt admitted secondary to problem above with deficits below. Pt requiring min A for transfers and min guard for gait using RW. Mild unsteadiness noted throughout. Pt reports her daughter will be coming home from school next week and can assist as needed. In the meantime, her brother is able to provide assistance. Pt reports she was receiving HHPT prior to admission. Recommend resumption at d/c. Will continue to follow acutely.     Follow Up Recommendations Home health PT (resume HHPT)    Equipment Recommendations  None recommended by PT    Recommendations for Other Services       Precautions / Restrictions Precautions Precautions: Fall Restrictions Weight Bearing Restrictions: No      Mobility  Bed Mobility Overal bed mobility: Needs Assistance Bed Mobility: Supine to Sit;Sit to Supine     Supine to sit: Supervision Sit to supine: Supervision   General bed mobility comments: Supervision for safety.    Transfers Overall transfer level: Needs assistance Equipment used: Rolling walker (2 wheeled) Transfers: Sit to/from Stand Sit to Stand: Min assist         General transfer comment: Min A for steadying assist as pt with slight posterior LOB. Cues for hand placement.  Ambulation/Gait Ambulation/Gait assistance: Min guard Gait Distance (Feet): 100 Feet Assistive device: Rolling walker (2 wheeled) Gait Pattern/deviations: Step-through pattern;Decreased stride length Gait velocity: Decreased   General Gait Details: Mildly unsteady gait. No overt LOB noted. Did report pain in L hip which limited ambulation  distance.  Stairs            Wheelchair Mobility    Modified Rankin (Stroke Patients Only)       Balance Overall balance assessment: Needs assistance Sitting-balance support: No upper extremity supported;Feet supported Sitting balance-Leahy Scale: Fair     Standing balance support: Bilateral upper extremity supported;During functional activity Standing balance-Leahy Scale: Poor Standing balance comment: Reliant on BUE support                             Pertinent Vitals/Pain Pain Assessment: Faces Faces Pain Scale: Hurts little more Pain Location: L hip Pain Descriptors / Indicators: Guarding;Grimacing Pain Intervention(s): Limited activity within patient's tolerance;Monitored during session;Repositioned    Home Living Family/patient expects to be discharged to:: Private residence Living Arrangements: Alone Available Help at Discharge: Family;Available 24 hours/day Type of Home: House Home Access: Stairs to enter Entrance Stairs-Rails: None Entrance Stairs-Number of Steps: 2 Home Layout: Two level Home Equipment: Shower seat;Cane - single point;Walker - 4 wheels;Walker - 2 wheels      Prior Function Level of Independence: Independent with assistive device(s)         Comments: Has been using RW vs rollator since d/c home.     Hand Dominance        Extremity/Trunk Assessment   Upper Extremity Assessment Upper Extremity Assessment: Generalized weakness    Lower Extremity Assessment Lower Extremity Assessment: Generalized weakness    Cervical / Trunk Assessment Cervical / Trunk Assessment: Kyphotic  Communication   Communication: No difficulties  Cognition Arousal/Alertness: Awake/alert Behavior During Therapy: WFL for tasks assessed/performed  Overall Cognitive Status: No family/caregiver present to determine baseline cognitive functioning                                 General Comments: Likely close to baseline. Has  hx of CVA      General Comments      Exercises     Assessment/Plan    PT Assessment Patient needs continued PT services  PT Problem List Decreased strength;Decreased balance;Decreased mobility;Pain;Decreased activity tolerance       PT Treatment Interventions DME instruction;Gait training;Stair training;Functional mobility training;Therapeutic activities;Therapeutic exercise;Balance training;Patient/family education    PT Goals (Current goals can be found in the Care Plan section)  Acute Rehab PT Goals Patient Stated Goal: to go home PT Goal Formulation: With patient Time For Goal Achievement: 05/27/20 Potential to Achieve Goals: Good    Frequency Min 3X/week   Barriers to discharge        Co-evaluation               AM-PAC PT "6 Clicks" Mobility  Outcome Measure Help needed turning from your back to your side while in a flat bed without using bedrails?: None Help needed moving from lying on your back to sitting on the side of a flat bed without using bedrails?: None Help needed moving to and from a bed to a chair (including a wheelchair)?: A Little Help needed standing up from a chair using your arms (e.g., wheelchair or bedside chair)?: A Little Help needed to walk in hospital room?: A Little Help needed climbing 3-5 steps with a railing? : A Lot 6 Click Score: 19    End of Session Equipment Utilized During Treatment: Gait belt Activity Tolerance: Patient tolerated treatment well Patient left: in bed;with call bell/phone within reach;with bed alarm set Nurse Communication: Mobility status PT Visit Diagnosis: Unsteadiness on feet (R26.81);Muscle weakness (generalized) (M62.81)    Time: [id number] PT Time Calculation (min) (ACUTE ONLY): 14 min   Charges:   PT Evaluation $PT Eval Low Complexity: 1 Low          Lou Miner, DPT  Acute Rehabilitation Services  Pager: [phone] Office: [phone]   Rudean Hitt 05/13/2020, 3:38  PM

## 2020-05-14 ENCOUNTER — Other Ambulatory Visit: Payer: Self-pay

## 2020-05-14 ENCOUNTER — Inpatient Hospital Stay (HOSPITAL_COMMUNITY): Payer: Medicare PPO

## 2020-05-14 DIAGNOSIS — N189 Chronic kidney disease, unspecified: Secondary | ICD-10-CM

## 2020-05-14 DIAGNOSIS — I10 Essential (primary) hypertension: Secondary | ICD-10-CM

## 2020-05-14 DIAGNOSIS — N179 Acute kidney failure, unspecified: Secondary | ICD-10-CM

## 2020-05-14 LAB — CBC WITH DIFFERENTIAL/PLATELET
Abs Immature Granulocytes: 0.02 10*3/uL (ref 0.00–0.07)
Basophils Absolute: 0.1 10*3/uL (ref 0.0–0.1)
Basophils Relative: 1 %
Eosinophils Absolute: 0.3 10*3/uL (ref 0.0–0.5)
Eosinophils Relative: 3 %
HCT: 28.9 % — ABNORMAL LOW (ref 36.0–46.0)
Hemoglobin: 9.3 g/dL — ABNORMAL LOW (ref 12.0–15.0)
Immature Granulocytes: 0 %
Lymphocytes Relative: 29 %
Lymphs Abs: 2.3 10*3/uL (ref 0.7–4.0)
MCH: 29.2 pg (ref 26.0–34.0)
MCHC: 32.2 g/dL (ref 30.0–36.0)
MCV: 90.9 fL (ref 80.0–100.0)
Monocytes Absolute: 0.8 10*3/uL (ref 0.1–1.0)
Monocytes Relative: 9 %
Neutro Abs: 4.6 10*3/uL (ref 1.7–7.7)
Neutrophils Relative %: 58 %
Platelets: 213 10*3/uL (ref 150–400)
RBC: 3.18 MIL/uL — ABNORMAL LOW (ref 3.87–5.11)
RDW: 14 % (ref 11.5–15.5)
WBC: 8 10*3/uL (ref 4.0–10.5)
nRBC: 0 % (ref 0.0–0.2)

## 2020-05-14 LAB — GLUCOSE, CAPILLARY
Glucose-Capillary: 97 mg/dL (ref 70–99)
Glucose-Capillary: 108 mg/dL — ABNORMAL HIGH (ref 70–99)
Glucose-Capillary: 92 mg/dL (ref 70–99)
Glucose-Capillary: 215 mg/dL — ABNORMAL HIGH (ref 70–99)

## 2020-05-14 LAB — COMPREHENSIVE METABOLIC PANEL
ALT: 15 U/L (ref 0–44)
AST: 23 U/L (ref 15–41)
Albumin: 2.9 g/dL — ABNORMAL LOW (ref 3.5–5.0)
Alkaline Phosphatase: 65 U/L (ref 38–126)
Anion gap: 9 (ref 5–15)
BUN: 49 mg/dL — ABNORMAL HIGH (ref 8–23)
CO2: 23 mmol/L (ref 22–32)
Calcium: 8.7 mg/dL — ABNORMAL LOW (ref 8.9–10.3)
Chloride: 104 mmol/L (ref 98–111)
Creatinine, Ser: 3.55 mg/dL — ABNORMAL HIGH (ref 0.44–1.00)
GFR, Estimated: 12 mL/min — ABNORMAL LOW (ref >60)
Glucose, Bld: 95 mg/dL (ref 70–99)
Potassium: 4.1 mmol/L (ref 3.5–5.1)
Sodium: 136 mmol/L (ref 135–145)
Total Bilirubin: 0.4 mg/dL (ref 0.3–1.2)
Total Protein: 5.7 g/dL — ABNORMAL LOW (ref 6.5–8.1)

## 2020-05-14 MED ORDER — LABETALOL HCL 300 MG PO TABS
300.0000 mg | ORAL_TABLET | Freq: Two times a day (BID) | ORAL | Status: DC
  Administered 2020-05-14 – 2020-05-15 (×3): 300 mg via ORAL
  Filled 2020-05-14 (×3): qty 1

## 2020-05-14 NOTE — Progress Notes (Signed)
Renal artery duplex has been completed.   Results can be found under chart review under CV PROC. 05/14/2020 5:08 PM Rayleigh Gillyard RVT, RDMS

## 2020-05-14 NOTE — Patient Outreach (Signed)
[facility]) Care Management  05/14/2020  [name] Oct 15, 1940 [id number]   First telephone outreach attempt to obtain mRS. No answer. Left message for returned call.  Philmore Pali [facility] Management Assistant [phone]

## 2020-05-14 NOTE — TOC Initial Note (Signed)
Transition of Care [facility]) - Initial/Assessment Note    Patient Details  Name: [name] MRN: [id number] Date of Birth: 1940-08-29  Transition of Care [facility]) CM/SW Contact:    Marilu Favre, RN Phone Number: 05/14/2020, 1:54 PM  Clinical Narrative:                 Spoke to patient at bedside. From home alone, daughter can assist at discharge. Already has DME, would like [facility]) for HHPT/OT. Referral given to and accepted by [facility].  Expected Discharge Plan: [place] Barriers to Discharge: Continued Medical Work up   Patient Goals and CMS Choice Patient states their goals for this hospitalization and ongoing recovery are:: to return to home CMS Medicare.gov Compare Post Acute Care list provided to:: Patient Choice offered to / list presented to : Patient  Expected Discharge Plan and Services Expected Discharge Plan: [place]   Discharge Planning Services: CM Consult Post Acute Care Choice: [place] arrangements for the past 2 months: Single Family Home                 DME Arranged: N/A DME Agency: NA       HH Arranged: PT,OT [place] Agency: [facility] (now [facility]) Date [place]: 05/14/20 Time [place]: 56 Representative spoke with at [place]: [place] Arrangements/Services Living arrangements for the past 2 months: [place] with:: Self Patient language and need for interpreter reviewed:: Yes        Need for Family Participation in Patient Care: Yes (Comment) Care giver support system in place?: Yes (comment) Current home services: DME Criminal Activity/Legal Involvement Pertinent to Current Situation/Hospitalization: No - Comment as needed  Activities of Daily Living Home Assistive Devices/Equipment: Engineer, drilling (specify type) ADL Screening (condition at time of admission) Patient's cognitive ability  adequate to safely complete daily activities?: Yes Is the patient deaf or have difficulty hearing?: No Does the patient have difficulty seeing, even when wearing glasses/contacts?: Yes (left eye impairment 2/2 CVA) Does the patient have difficulty concentrating, remembering, or making decisions?: No Patient able to express need for assistance with ADLs?: Yes Does the patient have difficulty dressing or bathing?: No Independently performs ADLs?: Yes (appropriate for developmental age) Does the patient have difficulty walking or climbing stairs?: No Weakness of Legs: None Weakness of Arms/Hands: None  Permission Sought/Granted   Permission granted to share information with : No              Emotional Assessment Appearance:: Appears stated age Attitude/Demeanor/Rapport: Engaged Affect (typically observed): Accepting Orientation: : Oriented to Self,Oriented to Place,Oriented to Situation,Oriented to  Time Alcohol / Substance Use: Not Applicable Psych Involvement: No (comment)  Admission diagnosis:  CHF (congestive heart failure) (HCC) [I50.9] Chest tightness [R07.89] CKD (chronic kidney disease), stage IV ([place]) [N18.4] Acute on chronic combined systolic and diastolic CHF (congestive heart failure) ([place]) [I50.43] Cardiovascular renal disease, stage 1-4 or unspecified chronic kidney disease, without heart failure [I13.10] Patient Active Problem List   Diagnosis Date Noted  . Cardiovascular renal disease, stage 1-4 or unspecified chronic kidney disease, without heart failure 05/13/2020  . SOB (shortness of breath) 05/12/2020  . Cerebrovascular accident (CVA) due to embolism of right posterior cerebral artery ([place]) 05/11/2020  . CHF (congestive heart failure) ([place]) 05/11/2020  . Stable treated proliferative diabetic retinopathy of right eye without macular edema determined  by examination associated with type 2 diabetes mellitus ([place]) 03/02/2020  . Posterior vitreous detachment of right  eye 03/02/2020  . Anemia 02/28/2020  . Acute on chronic renal failure ([place]) 02/28/2020  . Hypokalemia   . Diabetic peripheral neuropathy ([place])   . Benign essential HTN   . Cerebral infarction due to embolism of right anterior cerebral artery ([place]) 02/19/2020  . Ischemic stroke of frontal lobe ([place]) 02/17/2020  . Cerebral embolism with cerebral infarction 02/12/2020  . Thrombocytopenia ([place]) 02/10/2020  . Acute left-sided weakness 02/09/2020  . Weakness 02/08/2020  . Chronic heart failure with preserved ejection fraction ([place]) 01/02/2020  . Atypical chest pain 05/31/2019  . Central retinal vein occlusion, left eye, stable 05/29/2019  . Central retinal vein occlusion of left eye 05/28/2019  . Proliferative diabetic retinopathy of left eye with macular edema associated with type 2 diabetes mellitus ([place]) 05/28/2019  . Acute on chronic combined systolic and diastolic CHF (congestive heart failure) ([place]) 11/13/2018  . S/P TAVR (transcatheter aortic valve replacement) 11/13/2018  . Hypertension   . Hyperlipidemia   . Renal lesion   . Severe aortic stenosis 10/23/2018  . CKD (chronic kidney disease), stage IV ([place]) 12/05/2017  . Coronary artery disease of native artery of native heart with stable angina pectoris ([place]) 01/07/2014  . S/P PTCA (percutaneous transluminal coronary angioplasty) 01/07/2014  . History of ischemic anterior cerebral artery stroke 01/30/2013   PCP:  Audley Hose, MD Pharmacy:   [id number]: [phone]: [phone]    Social Determinants of Health (SDOH) Interventions    Readmission Risk Interventions No flowsheet data found.

## 2020-05-14 NOTE — Progress Notes (Signed)
Subjective:  Feels much better. Leg edema has improved. No further orthopnea.  Intake/Output from previous day:  I/O last 3 completed shifts: In: 82 [P.O.:480; I.V.:3] Out: 1250 [Urine:1250] Total I/O In: 220 [P.O.:220] Out: -   Blood pressure (!) 145/56, pulse (!) 55, temperature 98 F (36.7 C), temperature source Oral, resp. rate 18, height 5' 3"  (1.6 m), weight 76.2 kg, SpO2 97 %.  Filed Weights   05/11/20 1259 05/13/20 0440 05/14/20 0602  Weight: 79.8 kg 76.6 kg 76.2 kg    Physical Exam Vitals and nursing note reviewed.  Constitutional:      General: She is not in acute distress.    Appearance: She is well-developed.  HENT:     Mouth/Throat:     Mouth: Mucous membranes are moist.  Eyes:     Extraocular Movements: Extraocular movements intact.  Neck:     Vascular: No JVD.  Cardiovascular:     Rate and Rhythm: Normal rate and regular rhythm.     Pulses: Intact distal pulses.          Carotid pulses are on the right side with bruit and on the left side with bruit.      Femoral pulses are 2+ on the right side and 2+ on the left side.      Dorsalis pedis pulses are 1+ on the right side and 1+ on the left side.       Posterior tibial pulses are 1+ on the right side and 1+ on the left side.     Heart sounds: Murmur heard.   Harsh midsystolic murmur is present with a grade of 2/6 at the upper right sternal border radiating to the neck.     Comments: Left upper arm AV fistula present  Pulmonary:     Effort: Pulmonary effort is normal.     Breath sounds: Normal breath sounds. No wheezing or rales.  Abdominal:     Palpations: Abdomen is soft.  Musculoskeletal:        General: Swelling (bilateral 1-2 + pitting edema) present.     Cervical back: Neck supple.  Skin:    General: Skin is warm and dry.     Capillary Refill: Capillary refill takes less than 2 seconds.  Neurological:     General: No focal deficit present.     Mental Status: She is alert and oriented to person,  place, and time.     Lab Results: BMP BNP (last 3 results) Recent Labs    05/11/20 1352  BNP 302.7*    ProBNP (last 3 results) No results for input(s): PROBNP in the last 8760 hours. BMP Latest Ref Rng & Units 05/14/2020 05/13/2020 05/12/2020  Glucose 70 - 99 mg/dL 95 154(H) 269(H)  BUN 8 - 23 mg/dL 49(H) 44(H) 42(H)  Creatinine 0.44 - 1.00 mg/dL 3.55(H) 3.36(H) 3.20(H)  BUN/Creat Ratio 12 - 28 - - -  Sodium 135 - 145 mmol/L 136 137 136  Potassium 3.5 - 5.1 mmol/L 4.1 4.6 4.5  Chloride 98 - 111 mmol/L 104 103 102  CO2 22 - 32 mmol/L 23 23 23   Calcium 8.9 - 10.3 mg/dL 8.7(L) 9.2 9.0   Hepatic Function Latest Ref Rng & Units 05/14/2020 05/11/2020 02/18/2020  Total Protein 6.5 - 8.1 g/dL 5.7(L) 6.3(L) 5.5(L)  Albumin 3.5 - 5.0 g/dL 2.9(L) 3.7 2.5(L)  AST 15 - 41 U/L 23 27 14(L)  ALT 0 - 44 U/L 15 19 9   Alk Phosphatase 38 - 126 U/L 65 72 75  Total Bilirubin 0.3 - 1.2 mg/dL 0.4 0.4 0.7   CBC Latest Ref Rng & Units 05/14/2020 05/11/2020 02/24/2020  WBC 4.0 - 10.5 K/uL 8.0 9.8 8.1  Hemoglobin 12.0 - 15.0 g/dL 9.3(L) 10.2(L) 10.2(L)  Hematocrit 36.0 - 46.0 % 28.9(L) 31.7(L) 31.8(L)  Platelets 150 - 400 K/uL 213 239 228   Lipid Panel     Component Value Date/Time   CHOL 296 (H) 02/11/2020 1625   TRIG 148 02/11/2020 1625   HDL 67 02/11/2020 1625   CHOLHDL 4.4 02/11/2020 1625   VLDL 30 02/11/2020 1625   LDLCALC 199 (H) 02/11/2020 1625   Cardiac Panel (last 3 results) No results for input(s): CKTOTAL, CKMB, TROPONINI, RELINDX in the last 72 hours.  HEMOGLOBIN A1C Lab Results  Component Value Date   HGBA1C 7.3 (H) 05/11/2020   MPG 162.81 05/11/2020   TSH Recent Labs    05/11/20 2105  TSH 3.147   Radiology:   DG Chest 1 View Result Date: 05/12/2020 CLINICAL DATA:  Shortness of breath.  History of CHF. EXAM: CHEST  1 VIEW COMPARISON:  05/11/2020. FINDINGS: Loop recorder noted in stable position. Prior cardiac valve replacement. Cardiomegaly with mild pulmonary venous  congestion. Low lung volumes with mild bibasilar atelectasis. No pleural effusion or pneumothorax  IMPRESSION: 1. Loop recorder stable position. Prior cardiac valve replacement. Cardiomegaly with mild pulmonary venous congestion. 2.  Low lung volumes with mild bibasilar atelectasis. Electronically Signed   By: Marcello Moores  Register   On: 05/12/2020 06:08   Cardiac Studies:  Right and left heart cath 10/23/18: RA: 4 mmHg RV: 54/9 mmHg PA: 54/17 mmHg, mean PAP 34 mmhg. PCWP: 25 mmHg with tall V wave  CO: 5.2 L/min CI: 3 L/min/m2  LM: Normal LAD: Normal LCx:: Normal RCA: Ostial calcific at least 60% stenosis. Ostial RPDA 20% stenosis.   Right coronary artery was difficult to engage. I had to use multiple catheters, and was finally able to engage with [facility] catheter. there was dampnening of the pressure with catheter engagement. Ostium is at least 60% stenosed with moderate calcification. There was no signifciant improvement with IC NTG. I did not perform   Lexiscan (Walking with mod Bruce)Tetrofosmin Stress Test 06/25/2019: Nondiagnostic ECG stress. The baseline blood pressure was 130/80 mmHg. Maximum blood pressure post injection was 200/70 mmHg, which is a hypertensive response to Lexiscan with Treadmill.  Myocardial perfusion is normal. Overall LV systolic function is normal without regional wall motion abnormalities. Stress LV EF: 61%.  No significant change from 08/06/2018. Low risk.   Echocardiogram 05/12/2020:   1. Left ventricular ejection fraction, by estimation, is 60 to 65%. The left ventricle has normal function. The left ventricle has no regional wall motion abnormalities. Left ventricular diastolic parameters are consistent with Grade II diastolic  dysfunction (pseudonormalization). Elevated left atrial pressure. 2. Right ventricular systolic function is normal. The right ventricular size is normal. There is normal pulmonary artery systolic pressure. 3. The mitral  valve is normal in structure. Trivial mitral valve regurgitation. No evidence of mitral stenosis. 4. The aortic valve has been repaired/replaced. Aortic valve regurgitation is not visualized. No aortic stenosis is present. There is a 23 mm Sapien prosthetic (TAVR) valve present in the aortic position. Procedure Date: 11/13/2018. Echo findings are  consistent with normal structure and function of the aortic valve prosthesis. 5. The inferior vena cava is normal in size with greater than 50% respiratory variability, suggesting right atrial pressure of 3 mmHg.  Comparison(s): The left ventricular diastolic function is significantly  worse.   EKG:   EKG 05/11/2020: Sinus rhythm with first-degree block at the rate of 58 bpm, left atrial enlargement, normal axis.  Incomplete right bundle branch block.  Poor R wave progression, cannot exclude anteroseptal infarct 4.  Diffuse nonspecific T abnormality, cannot exclude inferior ischemia.  Low-voltage complexes.  Abnormal EKG.  Compared to 02/15/2020 and 01/02/2020, inferior ST-T wave changes new.  Assessment   [name]  is a 80 y.o. African-American female patient with hypertension, hyperlipidemia, diabetes mellitus with stage III-IV chronic kidney disease, history of stroke in 2015 and again in 2020 S/P Loop recorder implantation, moderate coronary artery disease by cardiac catheterization in 2020, history of TAVR for severe aortic stenosis in November of 2020 is now admitted to the hospital with 3 days of worsening shortness of breath, PND and orthopnea.  Upon admission she was found to be in acute on chronic renal failure.  I am consulted to evaluate for heart failure.  1.  Acute on chronic diastolic heart failure 2.  Stage IV-V chronic kidney disease, acute on chronic kidney failure. 3.  History of TAVR, 23 mm Sapien prosthetic (TAVR) in November 2020, echocardiogram.'s normal functioning aortic valve. 4. DM controlled with complications  without hyperglycemia with stage 4 CKD   Recommendations:   Patient had done well and has had excellent diuresis with Torsemide and there is no uremic symptoms and hence no indication for dialysis. Renal function has slightly worsened, but expected with diuresis. Would continue present meds and await final recommendations from Nephrology. Agree with Torsemdie.  Will continue to hold and not use any ARB or ACEi.   BP is still up. Could increase labetalol to 300 mg BID. Will order renal duplex to evaluate for renal artery stenosis in view of CHF, worsening renal function and need for >3 drugs for hypertension control.  DM is stable. Will sign off for now and please do not hesitate to call or secure message if questions and will arrange OP visit with Dr. Virgina Jock in 1 week to 10 days TOC visit for CHF.  Would keep IP for another day at least.   Scheduled Meds: . allopurinol  50 mg Oral Daily  . amLODipine  10 mg Oral Daily  . aspirin EC  81 mg Oral Daily  . brimonidine  1 drop Left Eye BID  . calcitRIOL  0.25 mcg Oral Daily  . ezetimibe  10 mg Oral Daily  . folic acid  1 mg Oral Daily  . heparin  5,000 Units Subcutaneous Q12H  . hydrALAZINE  50 mg Oral Q8H  . insulin aspart  0-9 Units Subcutaneous TID WC  . insulin glargine  12 Units Subcutaneous QHS  . isosorbide mononitrate  60 mg Oral Daily  . labetalol  200 mg Oral BID  . polyethylene glycol  17 g Oral Daily  . rosuvastatin  40 mg Oral Daily  . senna-docusate  2 tablet Oral BID  . sodium chloride flush  3 mL Intravenous Q12H  . torsemide  40 mg Oral Daily  . venlafaxine XR  150 mg Oral Daily   Continuous Infusions: . sodium chloride     PRN Meds:.sodium chloride, acetaminophen, ondansetron (ZOFRAN) IV, polyvinyl alcohol, sodium chloride flush    Adrian Prows, MD, [facility] 05/14/2020, 8:17 AM Office: [phone] Pager: [phone]

## 2020-05-14 NOTE — Progress Notes (Signed)
PROGRESS NOTE   [name]  [id number] DOB: Jan 25, 1940 DOA: 05/11/2020 PCP: Audley Hose, MD  Brief Narrative:   1 black female Community dwelling CAD/AS status post TAVR 11/14/2018 Dr. [name] Rheumatoid arthritis CKD 3B Prior high-grade stenosis right coronary artery status post DES PCI 12/2013 For prior strokes including eye strokes and many more TIAs Follows with Dr. Brett Fairy for sleep studies/RLS and nocturia  Stroke 02-10-2020 with recrudescence 02/11/2020 = 5 mm acute infarct-discharged to rehab where she left on 02/26/2020  Patient readmitted from home to [facility] exertional dyspnea X 3 days-recently started by Dr. Einar Gip on hydralazine and Imdur and increased from 50-75 3 times daily   CXR on admission showed volume overload-x-ray showed congestion given Lasix 80x1 in ED   Hospital-Problem based course Decompensated acute superimposed on chronic systolic and diastolic heart failure Probable hypertensive urgency Echocardiogram Grd 2 DD-normal structure function of aortic valve prosthesis Diuretics initially held but then adjusted by nephrologist to home dose torsemide 40 and nephrology signed off Probable cardiorenal syndrome type III AKI likely secondary to Lasix Bladder scan negative Baseline GFR is between 13-18 Outpatient close follow-up Dr. Blinda Leatherwood probably need initiation of HD soon [cc Dr. Hollie Salk Continue calcitriol 0.25 daily Renal ultrasound performed 4/28 and pending CAD status post DES PCI 12/2013 Hypertensive urgency on admission History of TAVR 23 mm SAPIEN prosthetic 12/07/2018 Continue amlodipine 10, hydralazine 50 every 8, Imdur 60, labetalol increased to 300 twice daily-continue aspirin 81 Blood pressure control reasonable at [id number] systolic Echo shows normal functioning TAVR valve as above Multiple recurrent CVAs Continue aspirin and follow-up outpatient Dr. Brett Fairy Will be coordinating with her regarding outpatient sleep study  etc. etc. as has been seen by patient on 4/25  DVT prophylaxis: Heparin 3 times daily Code Status: Full Family Communication: Discussed with patient alone Disposition:  Status is: Observation  The patient will require care spanning > 2 midnights and should be moved to inpatient because: Persistent severe electrolyte disturbances, IV treatments appropriate due to intensity of illness or inability to take PO and Inpatient level of care appropriate due to severity of illness  Dispo: The patient is from: Home              Anticipated d/c is to: Home probably with home health              Patient currently is not medically stable to d/c.   Difficult to place patient No   Consultants:   None  Procedures: None  Antimicrobials: No   Subjective:  Doing better overall breathing okay no chest pain no fever no sputum   Objective: Vitals:   05/13/20 1812 05/14/20 0123 05/14/20 0602 05/14/20 1223  BP: (!) 130/58 (!) 135/52 (!) 145/56 (!) 138/40  Pulse: (!) 59 (!) 52 (!) 55 (!) 55  Resp: '16 18 18 18  '$ Temp: 98 F (36.7 C) 97.9 F (36.6 C) 98 F (36.7 C) 98.3 F (36.8 C)  TempSrc: Oral Oral Oral Oral  SpO2: 98% 98% 97% 95%  Weight:   76.2 kg   Height:        Intake/Output Summary (Last 24 hours) at 05/14/2020 1621 Last data filed at 05/14/2020 0742 Gross per 24 hour  Intake 460 ml  Output 1000 ml  Net -540 ml   Filed Weights   05/11/20 1259 05/13/20 0440 05/14/20 0602  Weight: 79.8 kg 76.6 kg 76.2 kg    Examination:  EOMI NCAT JVD elevated no bruit Chest clear no rales  no rhonchi Abdomen soft no rebound ROM intact lower extremities She has slight droop to the left side of face her power is 5/5 Mildly hyperreflexic Sensory is intact    Data Reviewed: personally reviewed   Data BUNs/creatinine 42/2.8-->44/3.3 Hemoglobin 10.2, platelet 239  Radiology Studies: No results found.   Scheduled Meds: . allopurinol  50 mg Oral Daily  . amLODipine  10 mg Oral Daily   . aspirin EC  81 mg Oral Daily  . brimonidine  1 drop Left Eye BID  . calcitRIOL  0.25 mcg Oral Daily  . ezetimibe  10 mg Oral Daily  . folic acid  1 mg Oral Daily  . heparin  5,000 Units Subcutaneous Q12H  . hydrALAZINE  50 mg Oral Q8H  . insulin aspart  0-9 Units Subcutaneous TID WC  . insulin glargine  12 Units Subcutaneous QHS  . isosorbide mononitrate  60 mg Oral Daily  . labetalol  300 mg Oral BID  . polyethylene glycol  17 g Oral Daily  . rosuvastatin  40 mg Oral Daily  . senna-docusate  2 tablet Oral BID  . sodium chloride flush  3 mL Intravenous Q12H  . torsemide  40 mg Oral Daily  . venlafaxine XR  150 mg Oral Daily   Continuous Infusions: . sodium chloride       LOS: 1 day   Time spent: 40  Nita Sells, MD [facility] To contact the attending provider between 7A-7P or the covering provider during after hours 7P-7A, please log into the web site [email or web address] and access using universal [place] password for that web site. If you do not have the password, please call the hospital operator.  05/14/2020, 4:21 PM

## 2020-05-14 NOTE — Care Management (Signed)
Was active with [facility] ([facility]) prior to admission  Was just discharge from their services April 30, 2020. Stacie with [facility] aware of admission and following   Magdalen Spatz RN

## 2020-05-14 NOTE — Progress Notes (Signed)
[facility] NEPHROLOGY PROGRESS NOTE  Assessment/ Plan: Pt is a 80 y.o. yo female with history of hypertension, diabetes, obesity, aortic stenosis status post TAVR, CAD, stroke, CKD stage IV/V presented with shortness of breath seen as a consultation for the management of CKD.   # CKD stage IV/V: Longstanding CKD thought to be due to diabetes and hypertension and follows closely at [place] [facility] with Dr Hollie Salk.  Status post left AV fistula created in 2020 and ready to use when needed.  She used to have elevated creatinine level around mid 3s however, recently the creatinine level is slightly better around 2.5- 3.3 after the TAVR.  On presentation, it sounds like she had mild fluid overload which was treated with IV diuretics.  Currently, she is on room air without any respiratory symptoms.  I think she is close to euvolemia and does not have any uremic symptoms to warrant urgent dialysis.  Resumed oral torsemide 40 mg starting from today.   Okay to discharge from renal perspective.  #Acute on chronic diastolic CHF: EF at 60 to 65% with grade 2 diastolic dysfunction.  Changing to torsemide 40.  Continue cardiac medications.  Seen by cardiologist.  #Hypertension: Currently on amlodipine, Imdur, hydralazine, labetalol in addition to diuretics.  Monitor blood pressure.  Avoid hypotensive episode.  #Anemia of CKD: Hemoglobin at goal on admission now slightly drifting down, follow outpatient.  #CKD-MBD/secondary hyperparathyroidism: Monitor calcium phosphorus level. Continue calcitriol.  Nothing further to add, sign off.  Please call back with question.  The patient will call our office to schedule earlier appointment.  Subjective: Patient was seen and examined at bedside.  She reports feeling well without any complaint.  Denies nausea, vomiting, chest pain, shortness of breath, dysgeusia.  Urine output around 1.2 L. Objective Vital signs in last 24 hours: Vitals:    05/13/20 1217 05/13/20 1812 05/14/20 0123 05/14/20 0602  BP: (!) 121/53 (!) 130/58 (!) 135/52 (!) 145/56  Pulse: (!) 56 (!) 59 (!) 52 (!) 55  Resp: '16 16 18 18  '$ Temp: 97.9 F (36.6 C) 98 F (36.7 C) 97.9 F (36.6 C) 98 F (36.7 C)  TempSrc: Oral Oral Oral Oral  SpO2: 99% 98% 98% 97%  Weight:    76.2 kg  Height:       Weight change: -0.408 kg  Intake/Output Summary (Last 24 hours) at 05/14/2020 1044 Last data filed at 05/14/2020 0742 Gross per 24 hour  Intake 700 ml  Output 1250 ml  Net -550 ml       Labs: Basic Metabolic Panel: Recent Labs  Lab 05/12/20 0332 05/13/20 0151 05/14/20 0414  NA 136 137 136  K 4.5 4.6 4.1  CL 102 103 104  CO2 '23 23 23  '$ GLUCOSE 269* 154* 95  BUN 42* 44* 49*  CREATININE 3.20* 3.36* 3.55*  CALCIUM 9.0 9.2 8.7*   Liver Function Tests: Recent Labs  Lab 05/11/20 1352 05/14/20 0414  AST 27 23  ALT 19 15  ALKPHOS 72 65  BILITOT 0.4 0.4  PROT 6.3* 5.7*  ALBUMIN 3.7 2.9*   No results for input(s): LIPASE, AMYLASE in the last 168 hours. No results for input(s): AMMONIA in the last 168 hours. CBC: Recent Labs  Lab 05/11/20 1352 05/14/20 0414  WBC 9.8 8.0  NEUTROABS  --  4.6  HGB 10.2* 9.3*  HCT 31.7* 28.9*  MCV 89.3 90.9  PLT 239 213   Cardiac Enzymes: No results for input(s): CKTOTAL, CKMB, CKMBINDEX, TROPONINI in the  last 168 hours. CBG: Recent Labs  Lab 05/12/20 2203 05/13/20 0609 05/13/20 1115 05/13/20 1616 05/13/20 2225  GLUCAP 131* 129* 116* 129* 133*    Iron Studies: No results for input(s): IRON, TIBC, TRANSFERRIN, FERRITIN in the last 72 hours. Studies/Results: ECHOCARDIOGRAM COMPLETE  Result Date: 05/12/2020    ECHOCARDIOGRAM REPORT   Patient Name:   [name] Date of Exam: 05/12/2020 Medical Rec #:  [id number]     Height:       63.0 in Accession #:    [id number]    Weight:       176.0 lb Date of Birth:  June 22, 1940     BSA:          1.831 m Patient Age:    8 years      BP:           123/45 mmHg Patient  Gender: F             HR:           55 bpm. Exam Location:  Inpatient Procedure: 2D Echo, Cardiac Doppler and Color Doppler Indications:    CHF  History:        Patient has prior history of Echocardiogram examinations, most                 recent 09/10/2018. CHF, CAD, TAVR 11/13/2018 with 23 mm Edwards                 S3U via TF approach, CVA; Risk Factors:Hypertension,                 Dyslipidemia and Diabetes.                 Aortic Valve: 23 mm Sapien prosthetic, stented (TAVR) valve is                 present in the aortic position. Procedure Date: 11/13/2018.  Sonographer:    Maudry Mayhew MHA, RDMS, RVT, RDCS Referring Phys: [id number] Lequita Halt  Sonographer Comments: Image acquisition challenging due to patient body habitus and Image acquisition challenging due to respiratory motion. IMPRESSIONS  1. Left ventricular ejection fraction, by estimation, is 60 to 65%. The left ventricle has normal function. The left ventricle has no regional wall motion abnormalities. Left ventricular diastolic parameters are consistent with Grade II diastolic dysfunction (pseudonormalization). Elevated left atrial pressure.  2. Right ventricular systolic function is normal. The right ventricular size is normal. There is normal pulmonary artery systolic pressure.  3. The mitral valve is normal in structure. Trivial mitral valve regurgitation. No evidence of mitral stenosis.  4. The aortic valve has been repaired/replaced. Aortic valve regurgitation is not visualized. No aortic stenosis is present. There is a 23 mm Sapien prosthetic (TAVR) valve present in the aortic position. Procedure Date: 11/13/2018. Echo findings are consistent with normal structure and function of the aortic valve prosthesis.  5. The inferior vena cava is normal in size with greater than 50% respiratory variability, suggesting right atrial pressure of 3 mmHg. Comparison(s): The left ventricular diastolic function is significantly worse. FINDINGS  Left  Ventricle: Left ventricular ejection fraction, by estimation, is 60 to 65%. The left ventricle has normal function. The left ventricle has no regional wall motion abnormalities. The left ventricular internal cavity size was normal in size. There is  no left ventricular hypertrophy. Left ventricular diastolic parameters are consistent with Grade II diastolic dysfunction (pseudonormalization). Elevated left atrial pressure. Right Ventricle: The right ventricular  size is normal. No increase in right ventricular wall thickness. Right ventricular systolic function is normal. There is normal pulmonary artery systolic pressure. The tricuspid regurgitant velocity is 2.04 m/s, and  with an assumed right atrial pressure of 3 mmHg, the estimated right ventricular systolic pressure is [id number] mmHg. Left Atrium: Left atrial size was normal in size. Right Atrium: Right atrial size was normal in size. Pericardium: There is no evidence of pericardial effusion. Mitral Valve: The mitral valve is normal in structure. Mild to moderate mitral annular calcification. Trivial mitral valve regurgitation. No evidence of mitral valve stenosis. Tricuspid Valve: The tricuspid valve is normal in structure. Tricuspid valve regurgitation is not demonstrated. No evidence of tricuspid stenosis. Aortic Valve: The aortic valve has been repaired/replaced. Aortic valve regurgitation is not visualized. No aortic stenosis is present. Aortic valve mean gradient measures 15.0 mmHg. Aortic valve peak gradient measures 28.7 mmHg. Aortic valve area, by VTI measures 0.81 cm. There is a 23 mm Sapien prosthetic, stented (TAVR) valve present in the aortic position. Procedure Date: 11/13/2018. Echo findings are consistent with normal structure and function of the aortic valve prosthesis. Pulmonic Valve: The pulmonic valve was normal in structure. Pulmonic valve regurgitation is not visualized. No evidence of pulmonic stenosis. Aorta: The aortic root is normal in size  and structure. Venous: The inferior vena cava is normal in size with greater than 50% respiratory variability, suggesting right atrial pressure of 3 mmHg. IAS/Shunts: No atrial level shunt detected by color flow Doppler.  LEFT VENTRICLE PLAX 2D LVIDd:         4.40 cm     Diastology LVIDs:         2.80 cm     LV e' medial:    3.70 cm/s LV PW:         1.00 cm     LV E/e' medial:  31.6 LV IVS:        1.00 cm     LV e' lateral:   7.18 cm/s LVOT diam:     1.60 cm     LV E/e' lateral: 16.3 LV SV:         52 LV SV Index:   29 LVOT Area:     2.01 cm  LV Volumes (MOD) LV vol d, MOD A2C: 65.6 ml LV vol d, MOD A4C: 69.1 ml LV vol s, MOD A2C: 34.4 ml LV vol s, MOD A4C: 24.5 ml LV SV MOD A2C:     31.2 ml LV SV MOD A4C:     69.1 ml LV SV MOD BP:      37.6 ml RIGHT VENTRICLE RV S prime:     8.27 cm/s TAPSE (M-mode): 1.4 cm LEFT ATRIUM             Index       RIGHT ATRIUM           Index LA diam:        3.50 cm 1.91 cm/m  RA Area:     11.90 cm LA Vol (A2C):   75.7 ml 41.33 ml/m RA Volume:   22.30 ml  12.18 ml/m LA Vol (A4C):   58.6 ml 31.97 ml/m LA Biplane Vol: 63.8 ml 34.84 ml/m  AORTIC VALVE AV Area (Vmax):    0.73 cm AV Area (Vmean):   0.76 cm AV Area (VTI):     0.81 cm AV Vmax:           268.00 cm/s AV Vmean:  183.000 cm/s AV VTI:            0.649 m AV Peak Grad:      28.7 mmHg AV Mean Grad:      15.0 mmHg LVOT Vmax:         97.00 cm/s LVOT Vmean:        69.200 cm/s LVOT VTI:          0.260 m LVOT/AV VTI ratio: 0.40 MITRAL VALVE                TRICUSPID VALVE MV Area (PHT): 2.69 cm     TR Peak grad:   16.6 mmHg MV Decel Time: 282 msec     TR Vmax:        204.00 cm/s MR Peak grad: 39.4 mmHg MR Vmax:      314.00 cm/s   SHUNTS MV E velocity: 117.00 cm/s  Systemic VTI:  0.26 m MV A velocity: 104.00 cm/s  Systemic Diam: 1.60 cm MV E/A ratio:  1.12 Mihai Croitoru MD Electronically signed by Sanda Klein MD Signature Date/Time: 05/12/2020/1:00:51 PM    Final     Medications: Infusions: . sodium chloride       Scheduled Medications: . allopurinol  50 mg Oral Daily  . amLODipine  10 mg Oral Daily  . aspirin EC  81 mg Oral Daily  . brimonidine  1 drop Left Eye BID  . calcitRIOL  0.25 mcg Oral Daily  . ezetimibe  10 mg Oral Daily  . folic acid  1 mg Oral Daily  . heparin  5,000 Units Subcutaneous Q12H  . hydrALAZINE  50 mg Oral Q8H  . insulin aspart  0-9 Units Subcutaneous TID WC  . insulin glargine  12 Units Subcutaneous QHS  . isosorbide mononitrate  60 mg Oral Daily  . labetalol  300 mg Oral BID  . polyethylene glycol  17 g Oral Daily  . rosuvastatin  40 mg Oral Daily  . senna-docusate  2 tablet Oral BID  . sodium chloride flush  3 mL Intravenous Q12H  . torsemide  40 mg Oral Daily  . venlafaxine XR  150 mg Oral Daily    have reviewed scheduled and prn medications.  Physical Exam: General:NAD, comfortable Heart:RRR, s1s2 nl Lungs:clear b/l, no crackle Abdomen:soft, Non-tender, non-distended Extremities:No edema Dialysis Access: Left AV fistula has good thrill and bruit and maturing well.  [name] 05/14/2020,10:44 AM  LOS: 1 day

## 2020-05-15 LAB — GLUCOSE, CAPILLARY: Glucose-Capillary: 98 mg/dL (ref 70–99)

## 2020-05-15 MED ORDER — ISOSORBIDE MONONITRATE ER 60 MG PO TB24: 60.0000 mg | ORAL_TABLET | Freq: Every day | ORAL | 11 refills | Status: DC

## 2020-05-15 MED ORDER — LABETALOL HCL 300 MG PO TABS: 300.0000 mg | ORAL_TABLET | Freq: Two times a day (BID) | ORAL | 11 refills | Status: DC

## 2020-05-15 NOTE — Progress Notes (Signed)
Heart Failure Navigator Progress Note  Assessed for Heart & Vascular TOC clinic readiness.  Unfortunately at this time the patient does not meet criteria due to continued poor SCr over 3.5 today.   Navigator available for reassessment of patient.   Pricilla Holm, RN, BSN Heart Failure Nurse Navigator [phone]

## 2020-05-15 NOTE — Progress Notes (Signed)
Physical Therapy Treatment Patient Details Name: LILIANAH BUFFIN MRN: [id number] DOB: [birth year] Today's Date: 05/15/2020    History of Present Illness Pt is an 80 y/o female admitted 4/25 secondary to worsening SOB. Thought to be secondary to dCHF exacerbation. Pt also with AKI. PMH includes HTN, DM, s/p TAVR, CAD, CVA, and CKD.    PT Comments    Pt received standing in room, willing to participate in PT. Progressing towards PT goals. Pt fairly steady without AD in static standing and for short distances, but benefits from UE support for longer distances. Able to progress gait distance. No LOB, but some cueing for pacing. Education provided on energy management and fall prevention at home. Pt left in chair with all needs met and call bell within reach. Will continue to follow acutely.   Follow Up Recommendations  Home health PT (resume HHPT)     Equipment Recommendations  None recommended by PT    Recommendations for Other Services       Precautions / Restrictions Precautions Precautions: Fall Restrictions Weight Bearing Restrictions: No    Mobility  Bed Mobility               General bed mobility comments: Pt received standing    Transfers Overall transfer level: Needs assistance Equipment used: Rolling walker (2 wheeled) Transfers: Sit to/from Stand Sit to Stand: Supervision            Ambulation/Gait Ambulation/Gait assistance: Supervision Gait Distance (Feet): 200 Feet Assistive device: Rolling walker (2 wheeled) Gait Pattern/deviations: Step-through pattern;Decreased stride length Gait velocity: Decreased   General Gait Details: no overt LOB, cueing for pacing   Stairs             Wheelchair Mobility    Modified Rankin (Stroke Patients Only)       Balance Overall balance assessment: Needs assistance Sitting-balance support: No upper extremity supported;Feet supported Sitting balance-Leahy Scale: Good     Standing balance support:  Bilateral upper extremity supported;During functional activity Standing balance-Leahy Scale: Fair                              Cognition Arousal/Alertness: Awake/alert Behavior During Therapy: WFL for tasks assessed/performed Overall Cognitive Status: No family/caregiver present to determine baseline cognitive functioning                                 General Comments: Likely close to baseline. Has hx of CVA      Exercises      General Comments        Pertinent Vitals/Pain Pain Assessment: No/denies pain    Home Living                      Prior Function            PT Goals (current goals can now be found in the care plan section) Acute Rehab PT Goals Patient Stated Goal: to go home PT Goal Formulation: With patient Time For Goal Achievement: 05/27/20 Potential to Achieve Goals: Good Progress towards PT goals: Progressing toward goals    Frequency    Min 3X/week      PT Plan Current plan remains appropriate    Co-evaluation              AM-PAC PT "6 Clicks" Mobility   Outcome Measure  Help needed turning from  your back to your side while in a flat bed without using bedrails?: None Help needed moving from lying on your back to sitting on the side of a flat bed without using bedrails?: None Help needed moving to and from a bed to a chair (including a wheelchair)?: A Little Help needed standing up from a chair using your arms (e.g., wheelchair or bedside chair)?: A Little Help needed to walk in hospital room?: A Little Help needed climbing 3-5 steps with a railing? : A Little 6 Click Score: 20    End of Session Equipment Utilized During Treatment: Gait belt Activity Tolerance: Patient tolerated treatment well Patient left: with call bell/phone within reach;in chair Nurse Communication: Mobility status PT Visit Diagnosis: Unsteadiness on feet (R26.81);Muscle weakness (generalized) (M62.81)     Time:  -      Charges:                        Rosita Kea, SPT

## 2020-05-15 NOTE — Discharge Summary (Signed)
Physician Discharge Summary  [name] [id number] DOB: 01-Jul-1940 DOA: 05/11/2020  PCP: Audley Hose, MD  Admit date: 05/11/2020 Discharge date: 05/15/2020  Time spent: 45 minutes  Recommendations for Outpatient Follow-up:  1. Imdur, labetalol increased this admission 2. Outpatient follow-up regarding abnormal left and right renal resistive indices as per Dr. Lilli Few no intervention at this time 3. Need outpatient renal panel 1 week and follow-up with Dr. Hollie Salk for initiation HD 4. Will require outpatient sleep study to be rescheduled with Dr. Brett Fairy on discharge  Discharge Diagnoses:  MAIN problem for hospitalization   Acute volume overload secondary to acute decompensated HFpEF/cardiorenal syndrome  Please see below for itemized issues addressed in HOpsital- refer to other progress notes for clarity if needed  Discharge Condition: Improved  Diet recommendation: Renal  Filed Weights   05/13/20 0440 05/14/20 0602 05/15/20 0434  Weight: 76.6 kg 76.2 kg 75.6 kg    History of present illness:  8 black female Community dwelling CAD/AS status post TAVR 11/14/2018 Dr. [name] Rheumatoid arthritis CKD 3B Prior high-grade stenosis right coronary artery status post DES PCI 12/2013 For prior strokes including eye strokes and many more TIAs Follows with Dr. Brett Fairy for sleep studies/RLS and nocturia  Stroke 02/18/2020 with recrudescence 02/11/2020 = 5 mm acute infarct-discharged to rehab where she left on 02/26/2020  Patient readmitted from home to [facility] exertional dyspnea X 3 days-recently started by Dr. Einar Gip on hydralazine and Imdur and increased from 50-75 3 times daily   CXR on admission showed volume overload-x-ray showed congestion given Lasix 80x1 in ED patient evaluated by Dr. Einar Gip in hospital as well as nephrology with regards to worsening kidney function See below  Hospital Course:  Decompensated acute superimposed on chronic systolic  and diastolic heart failure Probable hypertensive urgency Echocardiogram Grd 2 DD-normal structure function of aortic valve prosthesis Diuretics initially held but then adjusted by nephrologist to home dose torsemide 40 and nephrology signed off Probable cardiorenal syndrome type III AKI likely secondary to Lasix Bladder scan negative Renal artery duplex showed abnormal indices however not really indicative of renal artery stenosis-this seems to be more medical renal disease Baseline GFR is between 13-18 Outpatient close follow-up Dr. Blinda Leatherwood probably need initiation of HD soon [cc Dr. Hollie Salk Continue calcitriol 0.25 daily CAD status post DES PCI 12/2013 Hypertensive urgency on admission History of TAVR 23 mm SAPIEN prosthetic 12/07/2018 Continue amlodipine 10, hydralazine 50 every 8, Imdur 60, labetalol increased to 300 twice daily-continue aspirin 81 Blood pressure control reasonable at [id number] systolic Echo shows normal functioning TAVR valve as above Multiple recurrent CVAs Continue aspirin and follow-up outpatient Dr. Brett Fairy Will be coordinating with her regarding outpatient sleep study etc. etc. as has been seen by patient on 4/25  Procedures:  Renal arterial duplex 4/28  Echocardiogram  Consultations:  Nephrology  Cardiology  Discharge Exam: Vitals:   05/14/20 2358 05/15/20 0434  BP: (!) 118/41 (!) 138/52  Pulse: (!) 55 (!) 53  Resp: 18 18  Temp: 98.4 F (36.9 C) 98.6 F (37 C)  SpO2: 99% 98%    Subj on day of d/c   Patient well alert coherent ambulatory not requiring oxygen eating drinking  General Exam on discharge  Pleasant EOMI NCAT looks younger than stated age Moderate dentition Chest clear no added sound no rales no rhonchi Abdomen soft no rebound no guarding No lower extremity edema ROM intact bilaterally Power 5/5  Discharge Instructions   Discharge Instructions    Diet - low  sodium heart healthy   Complete by: As directed    Discharge  instructions   Complete by: As directed    Make sure that you take your medications as we have indicated as some of your blood pressure meds have been increased and have changed  you will need follow-up with Dr. Hollie Salk of nephrology in the near future for lab work and possibly initiate dialysis in the outpatient setting Attempt to restrict her fluid to about 1200 cc but if you are going to be in the warm weather increase at 1500 cc please check your weight every single day to ensure you are not gaining fluid weight and report this to your nephrologist or cardiologist to discuss change in meds   Increase activity slowly   Complete by: As directed      Allergies as of 05/15/2020   No Known Allergies     Medication List    STOP taking these medications   ONE TOUCH ULTRA TEST test strip Generic drug: glucose blood   potassium chloride 10 MEQ tablet Commonly known as: KLOR-CON     TAKE these medications   acetaminophen 325 MG tablet Commonly known as: TYLENOL Take 650 mg by mouth every 6 (six) hours as needed for moderate pain or headache.   allopurinol 100 MG tablet Commonly known as: ZYLOPRIM Take 50 mg by mouth daily.   amLODipine 10 MG tablet Commonly known as: NORVASC TAKE 1 TABLET BY MOUTH EVERY DAY   aspirin EC 81 MG tablet Take 1 tablet (81 mg total) by mouth daily. Swallow whole.   brimonidine 0.2 % ophthalmic solution Commonly known as: ALPHAGAN Place into the left eye once.   calcitRIOL 0.25 MCG capsule Commonly known as: ROCALTROL Take 0.25 mcg by mouth daily.   cetirizine 10 MG tablet Commonly known as: ZYRTEC Take 10 mg by mouth daily as needed for allergies.   ezetimibe 10 MG tablet Commonly known as: ZETIA Take 10 mg by mouth daily.   folic acid 1 MG tablet Commonly known as: FOLVITE Take 1 mg by mouth daily.   HumaLOG KwikPen 100 UNIT/ML KwikPen Generic drug: insulin lispro Inject 2-8 Units into the skin 3 (three) times daily before meals.    hydrALAZINE 50 MG tablet Commonly known as: APRESOLINE Take 1 tablet (50 mg total) by mouth every 8 (eight) hours.   insulin glargine 100 UNIT/ML injection Commonly known as: LANTUS Inject 0.12 mLs (12 Units total) into the skin at bedtime. What changed: how much to take   isosorbide mononitrate 60 MG 24 hr tablet Commonly known as: IMDUR Take 1 tablet (60 mg total) by mouth daily. Start taking on: May 16, 2020 What changed:   medication strength  how much to take   labetalol 300 MG tablet Commonly known as: NORMODYNE Take 1 tablet (300 mg total) by mouth 2 (two) times daily. What changed:   medication strength  how much to take   nitroGLYCERIN 0.4 MG SL tablet Commonly known as: NITROSTAT PLACE 1 TABLET (0.4 MG TOTAL) UNDER THE TONGUE EVERY 5 MINUTES X 3 DOSES AS NEEDED FOR CHEST PAIN. What changed: See the new instructions.   polyethylene glycol 17 g packet Commonly known as: MIRALAX / GLYCOLAX Take 17 g by mouth daily. What changed:   when to take this  reasons to take this   polyvinyl alcohol 1.4 % ophthalmic solution Commonly known as: LIQUIFILM TEARS Place 1 drop into both eyes 3 (three) times daily as needed for dry eyes.  rosuvastatin 40 MG tablet Commonly known as: CRESTOR Take 1 tablet (40 mg total) by mouth daily.   senna-docusate 8.6-50 MG tablet Commonly known as: Senokot-S Take 2 tablets by mouth 2 (two) times daily.   torsemide 20 MG tablet Commonly known as: DEMADEX Take 2 tablets (40 mg total) by mouth daily.   venlafaxine XR 150 MG 24 hr capsule Commonly known as: EFFEXOR-XR Take 1 capsule (150 mg total) by mouth daily.      No Known Allergies  Follow-up Information    [facility]   Specialty: [facility] Services Contact information: [place] [address] [phone]                The results of significant diagnostics from this hospitalization (including imaging,  microbiology, ancillary and laboratory) are listed below for reference.    Significant Diagnostic Studies: DG Chest 1 View  Result Date: 05/12/2020 CLINICAL DATA:  Shortness of breath.  History of CHF. EXAM: CHEST  1 VIEW COMPARISON:  05/11/2020. FINDINGS: Loop recorder noted in stable position. Prior cardiac valve replacement. Cardiomegaly with mild pulmonary venous congestion. Low lung volumes with mild bibasilar atelectasis. No pleural effusion or pneumothorax IMPRESSION: 1. Loop recorder stable position. Prior cardiac valve replacement. Cardiomegaly with mild pulmonary venous congestion. 2.  Low lung volumes with mild bibasilar atelectasis. Electronically Signed   By: Marcello Moores  Register   On: 05/12/2020 06:08   DG Chest 2 View  Result Date: 05/11/2020 CLINICAL DATA:  Shortness of breath EXAM: CHEST - 2 VIEW COMPARISON:  April 23, 2019 FINDINGS: There is cardiomegaly with pulmonary venous hypertension. There is a small right pleural effusion. No edema or airspace opacity. Patient is status post aortic valve replacement. A loop recorder is present on the left anteriorly. No adenopathy. Degenerative change noted in the thoracic spine. There is aortic atherosclerosis. IMPRESSION: Cardiomegaly with pulmonary vascular congestion. Small right pleural effusion. No edema or airspace opacity. Status post aortic valve replacement. Loop recorder on the left. Aortic Atherosclerosis (ICD10-I70.0). Electronically Signed   By: Lowella Grip III [name]   On: 05/11/2020 14:20   ECHOCARDIOGRAM COMPLETE  Result Date: 05/12/2020    ECHOCARDIOGRAM REPORT   Patient Name:   [name] Date of Exam: 05/12/2020 Medical Rec #:  [id number]     Height:       63.0 in Accession #:    [id number]    Weight:       176.0 lb Date of Birth:  1940/08/25     BSA:          1.831 m Patient Age:    36 years      BP:           123/45 mmHg Patient Gender: F             HR:           55 bpm. Exam Location:  Inpatient Procedure: 2D Echo,  Cardiac Doppler and Color Doppler Indications:    CHF  History:        Patient has prior history of Echocardiogram examinations, most                 recent 09/10/2018. CHF, CAD, TAVR 11/13/2018 with 23 mm Edwards                 S3U via TF approach, CVA; Risk Factors:Hypertension,  Dyslipidemia and Diabetes.                 Aortic Valve: 23 mm Sapien prosthetic, stented (TAVR) valve is                 present in the aortic position. Procedure Date: 11/13/2018.  Sonographer:    Maudry Mayhew MHA, RDMS, RVT, RDCS Referring Phys: [id number] Lequita Halt  Sonographer Comments: Image acquisition challenging due to patient body habitus and Image acquisition challenging due to respiratory motion. IMPRESSIONS  1. Left ventricular ejection fraction, by estimation, is 60 to 65%. The left ventricle has normal function. The left ventricle has no regional wall motion abnormalities. Left ventricular diastolic parameters are consistent with Grade II diastolic dysfunction (pseudonormalization). Elevated left atrial pressure.  2. Right ventricular systolic function is normal. The right ventricular size is normal. There is normal pulmonary artery systolic pressure.  3. The mitral valve is normal in structure. Trivial mitral valve regurgitation. No evidence of mitral stenosis.  4. The aortic valve has been repaired/replaced. Aortic valve regurgitation is not visualized. No aortic stenosis is present. There is a 23 mm Sapien prosthetic (TAVR) valve present in the aortic position. Procedure Date: 11/13/2018. Echo findings are consistent with normal structure and function of the aortic valve prosthesis.  5. The inferior vena cava is normal in size with greater than 50% respiratory variability, suggesting right atrial pressure of 3 mmHg. Comparison(s): The left ventricular diastolic function is significantly worse. FINDINGS  Left Ventricle: Left ventricular ejection fraction, by estimation, is 60 to 65%. The left  ventricle has normal function. The left ventricle has no regional wall motion abnormalities. The left ventricular internal cavity size was normal in size. There is  no left ventricular hypertrophy. Left ventricular diastolic parameters are consistent with Grade II diastolic dysfunction (pseudonormalization). Elevated left atrial pressure. Right Ventricle: The right ventricular size is normal. No increase in right ventricular wall thickness. Right ventricular systolic function is normal. There is normal pulmonary artery systolic pressure. The tricuspid regurgitant velocity is 2.04 m/s, and  with an assumed right atrial pressure of 3 mmHg, the estimated right ventricular systolic pressure is [id number] mmHg. Left Atrium: Left atrial size was normal in size. Right Atrium: Right atrial size was normal in size. Pericardium: There is no evidence of pericardial effusion. Mitral Valve: The mitral valve is normal in structure. Mild to moderate mitral annular calcification. Trivial mitral valve regurgitation. No evidence of mitral valve stenosis. Tricuspid Valve: The tricuspid valve is normal in structure. Tricuspid valve regurgitation is not demonstrated. No evidence of tricuspid stenosis. Aortic Valve: The aortic valve has been repaired/replaced. Aortic valve regurgitation is not visualized. No aortic stenosis is present. Aortic valve mean gradient measures 15.0 mmHg. Aortic valve peak gradient measures 28.7 mmHg. Aortic valve area, by VTI measures 0.81 cm. There is a 23 mm Sapien prosthetic, stented (TAVR) valve present in the aortic position. Procedure Date: 11/13/2018. Echo findings are consistent with normal structure and function of the aortic valve prosthesis. Pulmonic Valve: The pulmonic valve was normal in structure. Pulmonic valve regurgitation is not visualized. No evidence of pulmonic stenosis. Aorta: The aortic root is normal in size and structure. Venous: The inferior vena cava is normal in size with greater than  50% respiratory variability, suggesting right atrial pressure of 3 mmHg. IAS/Shunts: No atrial level shunt detected by color flow Doppler.  LEFT VENTRICLE PLAX 2D LVIDd:         4.40 cm     Diastology  LVIDs:         2.80 cm     LV e' medial:    3.70 cm/s LV PW:         1.00 cm     LV E/e' medial:  31.6 LV IVS:        1.00 cm     LV e' lateral:   7.18 cm/s LVOT diam:     1.60 cm     LV E/e' lateral: 16.3 LV SV:         52 LV SV Index:   29 LVOT Area:     2.01 cm  LV Volumes (MOD) LV vol d, MOD A2C: 65.6 ml LV vol d, MOD A4C: 69.1 ml LV vol s, MOD A2C: 34.4 ml LV vol s, MOD A4C: 24.5 ml LV SV MOD A2C:     31.2 ml LV SV MOD A4C:     69.1 ml LV SV MOD BP:      37.6 ml RIGHT VENTRICLE RV S prime:     8.27 cm/s TAPSE (M-mode): 1.4 cm LEFT ATRIUM             Index       RIGHT ATRIUM           Index LA diam:        3.50 cm 1.91 cm/m  RA Area:     11.90 cm LA Vol (A2C):   75.7 ml 41.33 ml/m RA Volume:   22.30 ml  12.18 ml/m LA Vol (A4C):   58.6 ml 31.97 ml/m LA Biplane Vol: 63.8 ml 34.84 ml/m  AORTIC VALVE AV Area (Vmax):    0.73 cm AV Area (Vmean):   0.76 cm AV Area (VTI):     0.81 cm AV Vmax:           268.00 cm/s AV Vmean:          183.000 cm/s AV VTI:            0.649 m AV Peak Grad:      28.7 mmHg AV Mean Grad:      15.0 mmHg LVOT Vmax:         97.00 cm/s LVOT Vmean:        69.200 cm/s LVOT VTI:          0.260 m LVOT/AV VTI ratio: 0.40 MITRAL VALVE                TRICUSPID VALVE MV Area (PHT): 2.69 cm     TR Peak grad:   16.6 mmHg MV Decel Time: 282 msec     TR Vmax:        204.00 cm/s MR Peak grad: 39.4 mmHg MR Vmax:      314.00 cm/s   SHUNTS MV E velocity: 117.00 cm/s  Systemic VTI:  0.26 m MV A velocity: 104.00 cm/s  Systemic Diam: 1.60 cm MV E/A ratio:  1.12 Mihai Croitoru MD Electronically signed by Sanda Klein MD Signature Date/Time: 05/12/2020/1:00:51 PM    Final    CUP PACEART REMOTE DEVICE CHECK  Result Date: 04/24/2020 ILR summary report received. Battery status OK. Normal device function. No  new symptom, tachy, brady, or pause episodes. No new AF episodes. Monthly summary reports and ROV/PRN.  R. Powers, CVRS  VAS US RENAL ARTERY DUPLEX  Result Date: 05/14/2020 ABDOMINAL VISCERAL Patient Name:  [name]  Date of Exam:   05/14/2020 Medical Rec #: [id number]      Accession #:  [id number] Date of Birth: October 19, 1940      Patient Gender: F Patient Age:   11Y Exam Location:  [facility] Procedure:      VAS US RENAL ARTERY DUPLEX Referring Phys: [id number] Adrian Prows -------------------------------------------------------------------------------- Indications: HTN High Risk Factors: Hypertension, hyperlipidemia, Diabetes, coronary artery                    disease, prior CVA. Other Factors: CHF, AO stenosis, cardiac stents, CKD4/5, obesity. Limitations: Air/bowel gas, obesity and patient's breathing. Comparison Study: No previous exam Performing Technologist: Rogelia Rohrer  Examination Guidelines: A complete evaluation includes B-mode imaging, spectral Doppler, color Doppler, and power Doppler as needed of all accessible portions of each vessel. Bilateral testing is considered an integral part of a complete examination. Limited examinations for reoccurring indications may be performed as noted.  Duplex Findings: +----------+--------+--------+------+--------+ MesentericPSV cm/sEDV cm/sPlaqueComments +----------+--------+--------+------+--------+ Aorta Mid   128      0                   +----------+--------+--------+------+--------+  Mesenteric Technologist observations: Celiac artery & SMA not seen due to bowel gas.    +------------------+--------+--------+-------+ Right Renal ArteryPSV cm/sEDV cm/sComment +------------------+--------+--------+-------+ Origin               80      10           +------------------+--------+--------+-------+ Proximal            100      8            +------------------+--------+--------+-------+ Mid                  89      17            +------------------+--------+--------+-------+ Distal               65      10           +------------------+--------+--------+-------+ +-----------------+--------+--------+-------+ Left Renal ArteryPSV cm/sEDV cm/sComment +-----------------+--------+--------+-------+ Proximal            58      6            +-----------------+--------+--------+-------+ Mid                 54      8            +-----------------+--------+--------+-------+ Distal              60      8            +-----------------+--------+--------+-------+  Technologist observations: Two large cystic structures seen - noted on previous exams. Largest being 2.95 x 3.66 x 2.92cm. +------------+--------+--------+----+-----------+--------+--------+----+ Right KidneyPSV cm/sEDV cm/sRI  Left KidneyPSV cm/sEDV cm/sRI   +------------+--------+--------+----+-----------+--------+--------+----+ Upper Pole  49      6       0.87Upper Pole 52      0       1.00 +------------+--------+--------+----+-----------+--------+--------+----+ Mid         27      5       0.80Mid        42      0       1.00 +------------+--------+--------+----+-----------+--------+--------+----+ Lower Pole  39      4       0.89Lower Pole 65      0       1.00 +------------+--------+--------+----+-----------+--------+--------+----+ Hilar       38  0       1.00Hilar      51      0       1.00 +------------+--------+--------+----+-----------+--------+--------+----+ +------------------+----+------------------+----+ Right Kidney          Left Kidney            +------------------+----+------------------+----+ RAR                   RAR                    +------------------+----+------------------+----+ RAR (manual)          RAR (manual)           +------------------+----+------------------+----+ Cortex            0.86Cortex            0.87 +------------------+----+------------------+----+ Cortex thickness       Corex thickness        +------------------+----+------------------+----+ Kidney length (cm)9.09Kidney length (cm)8.91 +------------------+----+------------------+----+  Summary: Renal:  Right: Normal size right kidney. Abnormal right Resistive Index. RRV        flow present. Left:  Normal size of left kidney. Abnormal left Resisitve Index.        LRV flow present. Cyst(s) noted. Mesenteric: Areas of limited visceral study include left kidney size and mesenteric arteries.  *See table(s) above for measurements and observations.     Preliminary     Microbiology: Recent Results (from the past 240 hour(s))  Resp Panel by RT-PCR (Flu A&B, Covid) Nasopharyngeal Swab     Status: None   Collection Time: 05/11/20  3:31 PM   Specimen: Nasopharyngeal Swab; Nasopharyngeal(NP) swabs in vial transport medium  Result Value Ref Range Status   SARS Coronavirus 2 by RT PCR NEGATIVE NEGATIVE Final    Comment: (NOTE) SARS-CoV-2 target nucleic acids are NOT DETECTED.  The SARS-CoV-2 RNA is generally detectable in upper respiratory specimens during the acute phase of infection. The lowest concentration of SARS-CoV-2 viral copies this assay can detect is 138 copies/mL. A negative result does not preclude SARS-Cov-2 infection and should not be used as the sole basis for treatment or other patient management decisions. A negative result may occur with  improper specimen collection/handling, submission of specimen other than nasopharyngeal swab, presence of viral mutation(s) within the areas targeted by this assay, and inadequate number of viral copies(<138 copies/mL). A negative result must be combined with clinical observations, patient history, and epidemiological information. The expected result is Negative.  Fact Sheet for Patients:  EntrepreneurPulse.com.au  Fact Sheet for Healthcare Providers:  IncredibleEmployment.be  This test is no t yet approved or  cleared by the [place] FDA and  has been authorized for detection and/or diagnosis of SARS-CoV-2 by FDA under an Emergency Use Authorization (EUA). This EUA will remain  in effect (meaning this test can be used) for the duration of the COVID-19 declaration under Section 564(b)(1) of the Act, 21 U.S.C.section 360bbb-3(b)(1), unless the authorization is terminated  or revoked sooner.       Influenza A by PCR NEGATIVE NEGATIVE Final   Influenza B by PCR NEGATIVE NEGATIVE Final    Comment: (NOTE) The Xpert Xpress SARS-CoV-2/FLU/RSV plus assay is intended as an aid in the diagnosis of influenza from Nasopharyngeal swab specimens and should not be used as a sole basis for treatment. Nasal washings and aspirates are unacceptable for Xpert Xpress SARS-CoV-2/FLU/RSV testing.  Fact Sheet for Patients: EntrepreneurPulse.com.au  Fact Sheet for Healthcare Providers: IncredibleEmployment.be  This test is not  yet approved or cleared by the [place] and has been authorized for detection and/or diagnosis of SARS-CoV-2 by FDA under an Emergency Use Authorization (EUA). This EUA will remain in effect (meaning this test can be used) for the duration of the COVID-19 declaration under Section 564(b)(1) of the Act, 21 U.S.C. section 360bbb-3(b)(1), unless the authorization is terminated or revoked.  Performed at [facility]      Labs: Basic Metabolic Panel: Recent Labs  Lab 05/11/20 1352 05/12/20 0332 05/13/20 0151 05/14/20 0414  NA 138 136 137 136  K 4.4 4.5 4.6 4.1  CL 105 102 103 104  CO2 '24 23 23 23  '$ GLUCOSE 143* 269* 154* 95  BUN 42* 42* 44* 49*  CREATININE 2.82* 3.20* 3.36* 3.55*  CALCIUM 9.3 9.0 9.2 8.7*   Liver Function Tests: Recent Labs  Lab 05/11/20 1352 05/14/20 0414  AST 27 23  ALT 19 15  ALKPHOS 72 65  BILITOT 0.4 0.4  PROT 6.3* 5.7*  ALBUMIN 3.7 2.9*   No results for input(s): LIPASE, AMYLASE  in the last 168 hours. No results for input(s): AMMONIA in the last 168 hours. CBC: Recent Labs  Lab 05/11/20 1352 05/14/20 0414  WBC 9.8 8.0  NEUTROABS  --  4.6  HGB 10.2* 9.3*  HCT 31.7* 28.9*  MCV 89.3 90.9  PLT 239 213   Cardiac Enzymes: No results for input(s): CKTOTAL, CKMB, CKMBINDEX, TROPONINI in the last 168 hours. BNP: BNP (last 3 results) Recent Labs    05/11/20 1352  BNP 302.7*    ProBNP (last 3 results) No results for input(s): PROBNP in the last 8760 hours.  CBG: Recent Labs  Lab 05/14/20 0623 05/14/20 1112 05/14/20 1606 05/14/20 2107 05/15/20 0504  GLUCAP 97 108* 92 215* 98       Signed:  Nita Sells MD   [facility] 05/15/2020, 9:46 AM

## 2020-05-15 NOTE — Discharge Instructions (Signed)
Acute Kidney Injury, Adult  Acute kidney injury is a sudden worsening of kidney function. The kidneys are organs that have several jobs. They filter the blood to remove waste products and extra fluid. They also maintain a healthy balance of minerals and hormones in the body, which helps control blood pressure and keep bones strong. With this condition, your kidneys do not do their jobs as well as they should. This condition ranges from mild to severe. Over time, it may develop into long-lasting (chronic) kidney disease. Early detection and treatment may prevent acute kidney injury from developing into a chronic condition. What are the causes? Common causes of this condition include:  A problem with blood flow to the kidneys. This may be caused by: ? Low blood pressure (hypotension) or shock. ? Blood loss. ? Heart and blood vessel (cardiovascular) disease. ? Severe burns. ? Liver disease.  Direct damage to the kidneys. This may be caused by: ? Certain medicines. ? A kidney infection. ? Poisoning. ? Being around or in contact with toxic substances. ? A surgical wound. ? A hard, direct hit to the kidney area.  A sudden blockage of urine flow. This may be caused by: ? Cancer. ? Kidney stones. ? An enlarged prostate in males. What increases the risk? You are more likely to develop this condition if you:  Are older than age 65.  Are female.  Are hospitalized, especially if you are in critical condition.  Have certain conditions, such as: ? Chronic kidney disease. ? Diabetes. ? Coronary artery disease and heart failure. ? Pulmonary disease. ? Chronic liver disease. What are the signs or symptoms? Symptoms of this condition may not be obvious until the condition becomes severe. Symptoms of this condition can include:  Tiredness (lethargy) or difficulty staying awake.  Nausea or vomiting.  Swelling (edema) of the face, legs, ankles, or feet.  Problems with urination, such  as: ? Pain in the abdomen, or pain along the side of your stomach (flank). ? Producing little or no urine. ? Passing urine with a weak flow.  Muscle twitches and cramps, especially in the legs.  Confusion or trouble concentrating.  Loss of appetite.  Fever. How is this diagnosed? Your health care provider can diagnose this condition based on your symptoms, medical history, and a physical exam.  You may also have other tests, such as:  Blood tests.  Urine tests.  Imaging tests.  A test in which a sample of tissue is removed from the kidneys to be examined under a microscope (kidney biopsy). How is this treated? Treatment for this condition depends on the cause and how severe the condition is. In mild cases, treatment may not be needed. The kidneys may heal on their own. In more severe cases, treatment will involve:  Treating the cause of the kidney injury. This may involve changing any medicines you are taking or adjusting your dosage.  Fluids. You may need specialized IV fluids to balance your body's needs.  Having a catheter placed to drain urine and prevent blockages.  Preventing problems from occurring. This may mean avoiding certain medicines or procedures that can cause further injury to the kidneys. In some cases, treatment may also require:  A procedure to remove toxic wastes from the body (dialysis or continuous renal replacement therapy, CRRT).  Surgery. This may be done to repair a torn kidney or to remove the blockage from the urinary system. Follow these instructions at home: Medicines  Take over-the-counter and prescription medicines only as   told by your health care provider.  Do not take any new medicines without your health care provider's approval. Many medicines can worsen your kidney damage.  Do not take any vitamin and mineral supplements without your health care provider's approval. Many nutritional supplements can worsen your kidney  damage. Lifestyle  If your health care provider prescribed changes to your diet, follow them. You may need to decrease the amount of protein you eat.  Achieve and maintain a healthy weight. If you need help with this, ask your health care provider.  Start or continue an exercise plan. Try to exercise at least 30 minutes a day, 5 days a week.  Do not use any products that contain nicotine or tobacco, such as cigarettes, e-cigarettes, and chewing tobacco. If you need help quitting, ask your health care provider.   General instructions  Keep track of your blood pressure. Report changes in your blood pressure as told by your health care provider.  Stay up to date with your vaccines. Ask your health care provider which vaccines you need.  Keep all follow-up visits as told by your health care provider. This is important.   Where to find more information  American Association of Kidney Patients: BombTimer.gl  National Kidney Foundation: www.kidney.[place]: https://mathis.com/  [facility]: ? www.lifeoptions.org ? www.kidneyschool.org Contact a health care provider if:  Your symptoms get worse.  You develop new symptoms. Get help right away if:  You develop symptoms of worsening kidney disease, which include: ? Headaches. ? Abnormally dark or light skin. ? Easy bruising. ? Frequent hiccups. ? Chest pain. ? Shortness of breath. ? End of menstruation in women. ? Seizures. ? Confusion or altered mental status. ? Abdominal or back pain. ? Itchiness.  You have a fever.  Your body is producing less urine.  You have pain or bleeding when you urinate. Summary  Acute kidney injury is a sudden worsening of kidney function.  Acute kidney injury can be caused by problems with blood flow to the kidneys, direct damage to the kidneys, and sudden blockage of urine flow.  Symptoms of this condition may not be obvious until it becomes severe.  Symptoms may include edema, lethargy, confusion, nausea or vomiting, and problems passing urine.  This condition can be diagnosed with blood tests, urine tests, and imaging tests. Sometimes a kidney biopsy is done to diagnose this condition.  Treatment for this condition often involves treating the underlying cause. It is treated with fluids, medicines, diet changes, dialysis, or surgery. This information is not intended to replace advice given to you by your health care provider. Make sure you discuss any questions you have with your health care provider. Document Revised: 11/13/2018 Document Reviewed: 11/13/2018 Elsevier Patient Education  2021 [place].   Chronic Kidney Disease, Adult Chronic kidney disease (CKD) occurs when the kidneys are slowly and permanently damaged over a long period of time. The kidneys are a pair of organs that do many important jobs in the body, including:  Removing waste and extra fluid from the blood to make urine.  Making hormones that maintain the amount of fluid in tissues and blood vessels.  Maintaining the right amount of fluids and chemicals in the body. A small amount of kidney damage may not cause problems, but a large amount of damage may make it hard or impossible for the kidneys to work right. Steps must be taken to slow kidney damage or to stop it from getting worse. If  steps are not taken, the kidneys may stop working permanently (end-stage renal disease, or ESRD). Most of the time, CKD does not go away, but it can often be controlled. People who have CKD are usually able to live full lives. What are the causes? The most common causes of this condition are diabetes and high blood pressure (hypertension). Other causes include:  Cardiovascular diseases. These affect the heart and blood vessels.  Kidney diseases. These include: ? Glomerulonephritis, or inflammation of the tiny filters in the kidneys. ? Interstitial nephritis. This is swelling  of the small tubes of the kidneys and of the surrounding structures. ? Polycystic kidney disease, in which clusters of fluid-filled sacs form within the kidneys. ? Renal vascular disease. This includes disorders that affect the arteries and veins of the kidneys.  Diseases that affect the body's defense system (immune system).  A problem with urine flow. This may be caused by: ? Kidney stones. ? Cancer. ? An enlarged prostate, in males.  A kidney infection or urinary tract infection (UTI) that keeps coming back.  Vasculitis. This is swelling or inflammation of the blood vessels. What increases the risk? Your chances of having kidney disease increase with age. The following factors may make you more likely to develop this condition:  A family history of kidney disease or kidney failure. Kidney failure means the kidneys can no longer work right.  Certain genetic diseases.  Taking medicines often that are damaging to the kidneys.  Being around or being in contact with toxic substances.  Obesity.  A history of tobacco use. What are the signs or symptoms? Symptoms of this condition include:  Feeling very tired (lethargic) and having less energy.  Swelling, or edema, of the face, legs, ankles, or feet.  Nausea or vomiting, or loss of appetite.  Confusion or trouble concentrating.  Muscle twitches and cramps, especially in the legs.  Dry, itchy skin.  A metallic taste in the mouth.  Producing less urine, or producing more urine (especially at night).  Shortness of breath.  Trouble sleeping. CKD may also result in not having enough red blood cells or hemoglobin in the blood (anemia) or having weak bones (bone disease). Symptoms develop slowly and may not be obvious until the kidney damage becomes severe. It is possible to have kidney disease for years without having symptoms. How is this diagnosed? This condition may be diagnosed based on:  Blood tests.  Urine  tests.  Imaging tests, such as an ultrasound or a CT scan.  A kidney biopsy. This involves removing a sample of kidney tissue to be looked at under a microscope. Results from these tests will help to determine how serious the CKD is. How is this treated? There is no cure for most cases of this condition, but treatment usually relieves symptoms and prevents or slows the worsening of the disease. Treatment may include:  Diet changes, which may require you to avoid alcohol and foods that are high in salt, potassium, phosphorous, and protein.  Medicines. These may: ? Lower blood pressure. ? Control blood sugar (glucose). ? Relieve anemia. ? Relieve swelling. ? Protect your bones. ? Improve the balance of salts and minerals in your blood (electrolytes).  Dialysis, which is a type of treatment that removes toxic waste from the body. It may be needed if you have kidney failure.  Managing any other conditions that are causing your CKD or making it worse. Follow these instructions at home: Medicines  Take over-the-counter and prescription medicines only  as told by your health care provider. The amount of some medicines that you take may need to be changed.  Do not take any new medicines unless approved by your health care provider. Many medicines can make kidney damage worse.  Do not take any vitamin and mineral supplements unless approved by your health care provider. Many nutritional supplements can make kidney damage worse. Lifestyle  Do not use any products that contain nicotine or tobacco, such as cigarettes, e-cigarettes, and chewing tobacco. If you need help quitting, ask your health care provider.  If you drink alcohol: ? Limit how much you use to:  0-1 drink a day for women who are not pregnant.  0-2 drinks a day for men. ? Know how much alcohol is in your drink. In the [place] one drink equals one 12 oz bottle of beer (355 mL), one 5 oz glass of wine (148 mL), or one 1 oz  glass of hard liquor (44 mL).  Maintain a healthy weight. If you need help, ask your health care provider.   General instructions  Follow instructions from your health care provider about eating or drinking restrictions, including any prescribed diet.  Track your blood pressure at home. Report changes in your blood pressure as told.  If you are being treated for diabetes, track your blood glucose levels as told.  Start or continue an exercise plan. Exercise at least 30 minutes a day, 5 days a week.  Keep your immunizations up to date as told.  Keep all follow-up visits. This is important.   Where to find more information  American Association of Kidney Patients: BombTimer.gl  National Kidney Foundation: www.kidney.[place]: https://mathis.com/  Life Options: www.lifeoptions.org  Kidney [employer]: www.kidneyschool.org Contact a health care provider if:  Your symptoms get worse.  You develop new symptoms. Get help right away if:  You develop symptoms of ESRD. These include: ? Headaches. ? Numbness in your hands or feet. ? Easy bruising. ? Frequent hiccups. ? Chest pain. ? Shortness of breath. ? Lack of menstrual periods, in women.  You have a fever.  You are producing less urine than usual.  You have pain or bleeding when you urinate or when you have a bowel movement. These symptoms may represent a serious problem that is an emergency. Do not wait to see if the symptoms will go away. Get medical help right away. Call your local emergency services (911 in the [place] Do not drive yourself to the hospital. Summary  Chronic kidney disease (CKD) occurs when the kidneys become damaged slowly over a long period of time.  The most common causes of this condition are diabetes and high blood pressure (hypertension).  There is no cure for most cases of CKD, but treatment usually relieves symptoms and prevents or slows the worsening of the disease. Treatment may  include a combination of lifestyle changes, medicines, and dialysis. This information is not intended to replace advice given to you by your health care provider. Make sure you discuss any questions you have with your health care provider. Document Revised: 04/10/2019 Document Reviewed: 04/10/2019 Elsevier Patient Education  [place].

## 2020-05-15 NOTE — Progress Notes (Signed)
Adding 90 day Modified Rankin Score per request of the stroke team for tracking outcomes.  Score based on functional level as reported in PT and OT evaluations of what pt was able to do prior to admission for CHF around 05/10/20.    05/15/20 1451  Modified Rankin (Stroke Patients Only)  Pre-Morbid Rankin Score 2  Modified Rankin [place], PT   Acute Rehabilitation Services  Pager [phone] Office [phone] 05/15/2020

## 2020-05-15 NOTE — Progress Notes (Signed)
PT Cancellation Note  Patient Details Name: [name] MRN: [id number] DOB: 03-Feb-1940   Cancelled Treatment:    Reason Eval/Treat Not Completed: Other (comment) taking a bath and not available for PT. Will attempt to return later if time/schedule allow.    Windell Norfolk, DPT, PN1   Supplemental Physical Therapist [facility]    Pager [phone] Acute Rehab Office [phone]

## 2020-05-19 ENCOUNTER — Encounter: Payer: Self-pay | Admitting: Cardiology

## 2020-05-19 ENCOUNTER — Ambulatory Visit: Payer: Medicare PPO | Admitting: Cardiology

## 2020-05-19 ENCOUNTER — Other Ambulatory Visit: Payer: Self-pay

## 2020-05-19 VITALS — BP 140/48 | HR 58 | Temp 98.0°F | Resp 16 | Ht 63.0 in | Wt 166.0 lb

## 2020-05-19 DIAGNOSIS — Z952 Presence of prosthetic heart valve: Secondary | ICD-10-CM

## 2020-05-19 DIAGNOSIS — E7800 Pure hypercholesterolemia, unspecified: Secondary | ICD-10-CM

## 2020-05-19 DIAGNOSIS — I10 Essential (primary) hypertension: Secondary | ICD-10-CM

## 2020-05-19 DIAGNOSIS — I2510 Atherosclerotic heart disease of native coronary artery without angina pectoris: Secondary | ICD-10-CM

## 2020-05-19 DIAGNOSIS — I5032 Chronic diastolic (congestive) heart failure: Secondary | ICD-10-CM

## 2020-05-19 MED ORDER — EVOLOCUMAB 140 MG/ML ~~LOC~~ SOAJ: 140.0000 mg | Freq: Once | SUBCUTANEOUS | Status: DC

## 2020-05-19 MED ORDER — REPATHA SURECLICK 140 MG/ML ~~LOC~~ SOAJ: 1.0000 mL | SUBCUTANEOUS | 3 refills | Status: DC

## 2020-05-19 MED ORDER — ISOSORBIDE MONONITRATE ER 120 MG PO TB24: 120.0000 mg | ORAL_TABLET | Freq: Every day | ORAL | 3 refills | Status: DC

## 2020-05-19 NOTE — Progress Notes (Signed)
Follow up visit  Subjective:   [name], female    DOB: 12/18/1940, 80 y.o.   MRN: [id number]    HPI   Chief Complaint  Patient presents with  . Congestive Heart Failure  . Transitions Of Care    Fluid    80 y.o. African American female with hypertension, hyperlipidemia, diabetes with CKD stage III/IV, CAD, h/o strokes (2015, 2020), s/p TAVR for severe AS (11/2018).   Patient was last seen in our office by Dr. Virgina Jock in 12/2019.  Since then patient was admitted 05/11/2020 - 05/15/2020 when she presented with worsening shortness of breath and was found to be in acute on chronic renal failure and acute on chronic diastolic heart failure. Patient diuresed well with improvement of symptoms and was discharged with increased isosorbide mononitrate and labetalol. Nephrology recommend continuing home torsemide at 40 mg.   Patient reports since discharge from the hospital she has been feeling much improved. She has had no recurrence of orthopnea or shortness of breath since discharge. She is tolerating current medications without issue. Patient does report fatigue since discharge. Denies chest pain, dyspnea, dizziness, syncope, near-syncope, leg swelling.   Notably patient has had hypertension in the past with Avastin injections.   Current Outpatient Medications on File Prior to Visit  Medication Sig Dispense Refill  . acetaminophen (TYLENOL) 325 MG tablet Take 650 mg by mouth every 6 (six) hours as needed for moderate pain or headache.     . allopurinol (ZYLOPRIM) 100 MG tablet Take 50 mg by mouth daily.    Marland Kitchen amLODipine (NORVASC) 10 MG tablet TAKE 1 TABLET BY MOUTH EVERY DAY (Patient taking differently: Take 10 mg by mouth daily.) 90 tablet 3  . aspirin EC 81 MG tablet Take 1 tablet (81 mg total) by mouth daily. Swallow whole. 150 tablet 2  . brimonidine (ALPHAGAN) 0.2 % ophthalmic solution Place into the left eye once.    . calcitRIOL (ROCALTROL) 0.25 MCG capsule Take 0.25 mcg by  mouth daily.     . cetirizine (ZYRTEC) 10 MG tablet Take 10 mg by mouth daily as needed for allergies.    Marland Kitchen ezetimibe (ZETIA) 10 MG tablet Take 10 mg by mouth daily.    . folic acid (FOLVITE) 1 MG tablet Take 1 mg by mouth daily.    Marland Kitchen HUMALOG KWIKPEN 100 UNIT/ML KwikPen Inject 2-8 Units into the skin 3 (three) times daily before meals.     . hydrALAZINE (APRESOLINE) 50 MG tablet Take 1 tablet (50 mg total) by mouth every 8 (eight) hours. 90 tablet 0  . insulin glargine (LANTUS) 100 UNIT/ML injection Inject 0.12 mLs (12 Units total) into the skin at bedtime. (Patient taking differently: Inject 10 Units into the skin at bedtime.) 10 mL 11  . labetalol (NORMODYNE) 300 MG tablet Take 1 tablet (300 mg total) by mouth 2 (two) times daily. 60 tablet 11  . nitroGLYCERIN (NITROSTAT) 0.4 MG SL tablet PLACE 1 TABLET (0.4 MG TOTAL) UNDER THE TONGUE EVERY 5 MINUTES X 3 DOSES AS NEEDED FOR CHEST PAIN. (Patient taking differently: Place 0.4 mg under the tongue every 5 (five) minutes as needed for chest pain.) 25 tablet 1  . polyethylene glycol (MIRALAX / GLYCOLAX) 17 g packet Take 17 g by mouth daily. (Patient taking differently: Take 17 g by mouth daily as needed for moderate constipation.) 30 each 0  . polyvinyl alcohol (LIQUIFILM TEARS) 1.4 % ophthalmic solution Place 1 drop into both eyes 3 (three) times daily as  needed for dry eyes.    . rosuvastatin (CRESTOR) 40 MG tablet Take 1 tablet (40 mg total) by mouth daily. 40 tablet 0  . senna-docusate (SENOKOT-S) 8.6-50 MG tablet Take 2 tablets by mouth 2 (two) times daily. 120 tablet 0  . torsemide (DEMADEX) 20 MG tablet Take 2 tablets (40 mg total) by mouth daily. 60 tablet 0  . venlafaxine XR (EFFEXOR-XR) 150 MG 24 hr capsule Take 1 capsule (150 mg total) by mouth daily. 30 capsule 0   No current facility-administered medications on file prior to visit.    Cardiovascular & other pertient studies: EKG 05/19/2020: Sinus bradycardia with first-degree AV block at  a rate of 56 bpm. Normal axis.  Poor R wave progression, cannot exclude anteroseptal infarct old. Nonspecific T wave abnormality.  Low voltage complexes.  Bilateral renal artery ultrasound 05/14/2020: Right: Normal size right kidney. Abnormal right Resistive Index. RRV     flow present.  Left: Normal size of left kidney. Abnormal left Resisitve Index.     LRV flow present. Cyst(s) noted.   Echocardiogram 05/12/2020: 1. Left ventricular ejection fraction, by estimation, is 60 to 65%. The  left ventricle has normal function. The left ventricle has no regional  wall motion abnormalities. Left ventricular diastolic parameters are  consistent with Grade II diastolic  dysfunction (pseudonormalization). Elevated left atrial pressure.  2. Right ventricular systolic function is normal. The right ventricular  size is normal. There is normal pulmonary artery systolic pressure.  3. The mitral valve is normal in structure. Trivial mitral valve  regurgitation. No evidence of mitral stenosis.  4. The aortic valve has been repaired/replaced. Aortic valve  regurgitation is not visualized. No aortic stenosis is present. There is a  23 mm Sapien prosthetic (TAVR) valve present in the aortic position.  Procedure Date: 11/13/2018. Echo findings are  consistent with normal structure and function of the aortic valve  prosthesis.  5. The inferior vena cava is normal in size with greater than 50%  respiratory variability, suggesting right atrial pressure of 3 mmHg.  Echocardiogram 12/23/2019:  Left ventricle cavity is normal in size. Moderate concentric hypertrophy  of the left ventricle. Normal global wall motion. Normal LV systolic  function with EF 56%. Normal diastolic filling pattern.  Left atrial cavity is mildly dilated.  Well seated Wende Crease Sapien 3 bioprosthetic aortic valve with  normal functioning. Mean PG 11 mmHg. No valvular regurgitation or  paravalvular leak.  Mild (Grade  I) mitral regurgitation.  Mild tricuspid regurgitation. Estimated pulmonary artery systolic pressure  31 mmHg.  Mild pulmonic regurgitation.  No significant change compared to previous study on 12/11/2018.  EKG 01/02/2020: Sinus rhythm 67 bpm Poor R-wave progression Low voltage Nonspecific T-abnormality  Lexiscan (Walking with mod Bruce)Tetrofosmin Stress Test  06/25/2019: Nondiagnostic ECG stress.  The baseline blood pressure was 130/80 mmHg. Maximum blood pressure post injection was 200/70 mmHg, which is a hypertensive response to Lexiscan with Treadmill.  Myocardial perfusion is normal. Overall LV systolic function is normal without regional wall motion abnormalities. Stress LV EF: 61%.  No significant change from 08/06/2018. Low risk.   EKG 05/31/2019: Sinus rhythm 65 bpm Normal EKG   Right and left heart cath 10/23/18:  RA: 4 mmHg RV: 54/9 mmHg PA: 54/17 mmHg, mean PAP 34 mmhg. PCWP: 25 mmHg with tall V wave  CO: 5.2 L/min CI: 3 L/min/m2  LM: Normal LAD: Normal LCx:: Normal RCA: Ostial calcific at least 60% stenosis. Ostial RPDA 20% stenosis.   Right coronary artery  was difficult to engage. I had to use multiple catheters, and was finally able to engage with [facility] catheter. there was dampnening of the pressure with catheter engagement. Ostium is at least 60% stenosed with moderate calcification. There was no signifciant improvement with IC NTG. I did not perform FFR in order to limit contrast use in this patient with Cr of 3.0, not on dialysis.  MRA of head 09/10/2018:  1. Acute infarct right occipital lobe. No associated hemorrhage in the infarct. Chronic microhemorrhage right parietal white matter appears separate. 2. Right M1 stent obscures evaluation of the right middle cerebral artery on MRA 3. Right posterior cerebral artery patent. 4. Occluded distal P3 branch on the left.   Carotid artery duplex 12/04/2013: No evidence of hemodynamically significant  stenosis in the bilateral carotid bifurcation vessels. There is evidence of minimal heterogeneous plaque in the bilateral carotid artery.  Recent labs: 05/14/2020: Hemoglobin 9.3, hematocrit 28.9, MCV 90.9, platelet 213 Sodium 136, potassium 4.1, glucose 95, BUN 49, creatinine 3.55, AST 23, ALT 15, alk phos 65, GFR 12  05/11/2020: TSH 3.147 A1c 7.3% BNP 302  02/11/2020: Total cholesterol 296, HDL 67, LDL 199, triglycerides 148  10/24/2019: Glucose 122. BUN/Cr 35/2.  05/03/2019:  Glucose 252, BUN/Cr 51/2.69. EGFR 19. Na/K 138/4.2. Rest of the CMP normal. H/H 10.7/33. MCV 90. Platelets 261  Chol 145, TG 154, HDL 45, LDL 74.  NTproBNP 615.   Uric Acid 8.3 (H)  11/09/2018: HbA1C 7.6%  03/06/2018: TSH 1.82 normal   Review of Systems  Constitutional: Positive for malaise/fatigue. Negative for weight gain.  Cardiovascular: Negative for chest pain, claudication, dyspnea on exertion, leg swelling, near-syncope, orthopnea, palpitations, paroxysmal nocturnal dyspnea and syncope.  Respiratory: Negative for shortness of breath.   Hematologic/Lymphatic: Does not bruise/bleed easily.  Gastrointestinal: Negative for melena.  Neurological: Negative for dizziness and weakness.       Vitals:   05/19/20 1307  BP: (!) 140/48  Pulse: (!) 58  Resp: 16  Temp: 98 F (36.7 C)  SpO2: 96%     Body mass index is 29.41 kg/m. Filed Weights   05/19/20 1307  Weight: 166 lb (75.3 kg)     Objective:   Physical Exam Vitals and nursing note reviewed.  Constitutional:      General: She is not in acute distress. Neck:     Vascular: No JVD.  Cardiovascular:     Rate and Rhythm: Normal rate and regular rhythm.     Pulses: Intact distal pulses.          Carotid pulses are on the right side with bruit and on the left side with bruit.      Femoral pulses are 2+ on the right side and 2+ on the left side.      Popliteal pulses are 1+ on the right side and 1+ on the left side.       Dorsalis  pedis pulses are 2+ on the right side and 2+ on the left side.       Posterior tibial pulses are 2+ on the right side and 2+ on the left side.     Heart sounds: Murmur heard.   Systolic murmur is present with a grade of 2/6 at the upper right sternal border radiating to the neck.     Comments: Left upper arm AV fistula present  Pulmonary:     Effort: Pulmonary effort is normal.     Breath sounds: Normal breath sounds. No wheezing or rales.  Musculoskeletal:  Right lower leg: Edema (trace) present.     Left lower leg: Edema (trace) present.  Neurological:     Mental Status: She is alert.       Assessment & Recommendations:   80 y.o. African American female with hypertension, hyperlipidemia, diabetes with CKD stage III/IV, CAD, h/o strokes (2015, 2020), s/p TAVR for severe AS (11/2018)  Chronic diastolic heart failure: There are no clinical signs of heart failure at today's visit.  Will continue present medications.   Hyperlipidemia:  Lipids uncontrolled, LDL 199  Continue rosuvastatin, Zetia  Start Repatha  Repeat lipid profile testing in 3 months   Hypertension: Uncontrolled.  Continue amlodipine, hydralazine, labetalol Increase isosorbide mononitrate from 60 mg to 120 mg once daily No evidence of renal artery stenosis.   Fatigue:  Likely multifactorial including heart failure, renal disease, and anemia.  Will defer management of anemia to PCP, patient has appointment tomorrow.   S/P TAVR (transcatheter aortic valve replacement): Normal functioning bioprosthetic aortic valve NYHA class 1 symptoms Given prior strokes, Plavix has been discontinued. Now on aspirin.   Chronic kidney disease (CKD), stage IV: Continues to follow closely with nephrology.    Evolocumab SOAJ 140 mg  [[id number]]  Ordered Dose: 140 mg Route: Subcutaneous Frequency: Once  Admin Dose: --     Scheduled Start Date/Time: 05/19/20 1415 End Date/Time: No end date specified (ordered for 1  doses)       Diagnosis Association: Coronary artery disease involving native coronary artery of native heart without angina pectoris (I25.10)    Order Status: Active  NF Ordered: Yes NF post-verification: No Non-Formulary Code: Acceptable to use formulary alternative  Ordering User: Milderd Meager, CMA Ordering Date/Time: Tue May 19, 2020 1400  Ordering Provider: Alethia Berthold, PA-C Authorizing Provider: Nigel Mormon, MD    Follow up in 3 months, sooner if needed.    Alethia Berthold, PA-C 05/20/2020, 9:31 AM Office: [phone]

## 2020-05-20 ENCOUNTER — Ambulatory Visit: Payer: Medicare PPO | Admitting: Cardiology

## 2020-05-21 ENCOUNTER — Other Ambulatory Visit: Payer: Self-pay

## 2020-05-21 ENCOUNTER — Ambulatory Visit (INDEPENDENT_AMBULATORY_CARE_PROVIDER_SITE_OTHER): Payer: Medicare PPO | Admitting: Neurology

## 2020-05-21 DIAGNOSIS — Z952 Presence of prosthetic heart valve: Secondary | ICD-10-CM

## 2020-05-21 DIAGNOSIS — G4733 Obstructive sleep apnea (adult) (pediatric): Secondary | ICD-10-CM | POA: Diagnosis not present

## 2020-05-21 DIAGNOSIS — E113551 Type 2 diabetes mellitus with stable proliferative diabetic retinopathy, right eye: Secondary | ICD-10-CM

## 2020-05-21 DIAGNOSIS — I63421 Cerebral infarction due to embolism of right anterior cerebral artery: Secondary | ICD-10-CM

## 2020-05-21 DIAGNOSIS — I63431 Cerebral infarction due to embolism of right posterior cerebral artery: Secondary | ICD-10-CM

## 2020-05-21 DIAGNOSIS — I739 Peripheral vascular disease, unspecified: Secondary | ICD-10-CM

## 2020-05-21 DIAGNOSIS — E113512 Type 2 diabetes mellitus with proliferative diabetic retinopathy with macular edema, left eye: Secondary | ICD-10-CM

## 2020-05-21 DIAGNOSIS — I639 Cerebral infarction, unspecified: Secondary | ICD-10-CM

## 2020-05-25 ENCOUNTER — Ambulatory Visit (INDEPENDENT_AMBULATORY_CARE_PROVIDER_SITE_OTHER): Payer: Medicare PPO

## 2020-05-25 DIAGNOSIS — I63431 Cerebral infarction due to embolism of right posterior cerebral artery: Secondary | ICD-10-CM | POA: Diagnosis not present

## 2020-05-26 ENCOUNTER — Other Ambulatory Visit: Payer: Self-pay

## 2020-05-26 MED ORDER — REPATHA SURECLICK 140 MG/ML ~~LOC~~ SOAJ: 1.0000 mL | SUBCUTANEOUS | 3 refills | Status: DC

## 2020-05-27 LAB — CUP PACEART REMOTE DEVICE CHECK
Date Time Interrogation Session: 20220510174428
Implantable Pulse Generator Implant Date: 20220131

## 2020-06-04 ENCOUNTER — Encounter: Payer: Self-pay | Admitting: Neurology

## 2020-06-04 ENCOUNTER — Telehealth: Payer: Self-pay | Admitting: Neurology

## 2020-06-04 DIAGNOSIS — I739 Peripheral vascular disease, unspecified: Secondary | ICD-10-CM | POA: Insufficient documentation

## 2020-06-04 NOTE — Telephone Encounter (Signed)
Called the patient there was no answer. Unable to LVM due to mailbox full. I will send a mychart message as well.

## 2020-06-04 NOTE — Procedures (Signed)
PATIENT'S NAMEKayln, [name] DOB:      November 25, 1940      MR#:    [id number]     DATE OF RECORDING: 05/21/2020 REFERRING [name]:  [name], [name] Study Performed:   Baseline Polysomnogram HISTORY:  [name], [name], is a 80 - year old African- American female patient of Susan. [name] and was recently admitted to hospital for another stroke-  She is seen here upon his referral on 05/11/2020  for a sleep apnea evaluation- she also is also a patient of [name], had "eye strokes" and proliferative diabetic retinopathy. Last visit with Susan [name] was in March 2022, last hospitalization Jan 22nd - Feb 9th 2022.     [name] has a past medical history of:  recurrent strokes, embolic Cerebrovascular disease, CKD (chronic kidney disease), Coronary artery disease, INSULIN dependent type 2  Diabetes ([place]), Diabetic peripheral neuropathy ([place]), Diabetic retinopathy ([name]), Diverticulitis, Gastroparesis, GERD (gastroesophageal reflux disease), S/P TAVR (transcatheter aortic valve replacement) (11/13/2018), and previously Severe aortic stenosis and uncontrolled hypertension.    The patient had the first sleep study in the year 2004 with a result of OSA.   Sleep relevant medical history: Nocturia 2 -3 times. Recurrent sinusitis, post nasal drip. DM 2with multiple manifestation, complicated, insulin dependent.    Family medical history: many other family members with DM, Strokes, CAD, nobody on CPAP with OSA. Mother, father and brother had heart disease    The patient endorsed the Epworth Sleepiness Scale at 9 points.   The patient's weight 176 pounds with a height of 63 (inches), resulting in a BMI of 31.3 kg/m2. The patient's neck circumference measured 15 inches.  CURRENT MEDICATIONS: Tylenol, Zyloprim, Norvasc, Aspirin, Alphagan, Recaltrol, Zyrtec, Folvite, Apresoline, Lantus, Imdur, Normodyne, Nitrostat, Miralax, Liquiform tears, Klor-Con, Crestor, Senokot, Demadex, Effexor   PROCEDURE:  This is a  multichannel digital polysomnogram utilizing the Somnostar 11.2 system.  Electrodes and sensors were applied and monitored per AASM Specifications.   EEG, EOG, Chin and Limb EMG, were sampled at 200 Hz.  ECG, Snore and Nasal Pressure, Thermal Airflow, Respiratory Effort, CPAP Flow and Pressure, Oximetry was sampled at 50 Hz. Digital video and audio were recorded.      BASELINE STUDY: Lights Out was at 21:59 and Lights On at 05:11.  Total recording time (TRT) was 433 minutes, with a total sleep time (TST) of 321.5 minutes.   The patient's sleep latency was 18.5 minutes.  REM latency was 252 minutes.  The sleep efficiency was 74.2 %. The sleep architecture was extremely fragmented.    SLEEP ARCHITECTURE: WASO (Wake after sleep onset) was 92.5 minutes.  There were 115.5 minutes in Stage N1, 165.5 minutes Stage N2, 0 minutes Stage N3 and 40.5 minutes in Stage REM.  The percentage of Stage N1 was 35.9%, Stage N2 was 51.5%, Stage N3 was 0% and Stage R (REM sleep) was 12.6%.     RESPIRATORY ANALYSIS:  There were a total of 95 respiratory events:  22 obstructive apneas, 3 central apneas and 4 mixed apneas with a total of 29 apneas and an apnea index (AI) of 5.4 /hour. There were 66 hypopneas with a hypopnea index of 12.3 /hour.  The patient also had some respiratory event related arousals (RERAs).      The total APNEA/HYPOPNEA INDEX (AHI) was 17.7/hour.  1 event occurred in REM sleep and 139 events in NREM.  The REM AHI was 1.5 /hour, versus a non-REM AHI of 20.1. The patient spent  29.5 minutes of total sleep time in the supine position and 292 minutes in non-supine. The supine AHI was 30.5 versus a non-supine AHI of 16.4.  OXYGEN SATURATION & C02:  The Wake baseline 02 saturation was 98%, with the lowest being 86%. Time spent below 89% saturation equaled 3 minutes. The arousals were noted as: 158 were spontaneous, 0 were associated with PLMs, 52 were associated with respiratory events. The patient had a  total of 0 Periodic Limb Movements.   Audio and video analysis did not show any abnormal or unusual movements, behaviors, phonations or vocalizations.   EKG was in regular rhythm, bradycardic.  IMPRESSION:  1. Obstructive Sleep Apnea (OSA), but with a peculiar dominance in supine and in NREM sleep. NREM AHI was 20.2/h supine AHI was 30.5/h.     RECOMMENDATIONS:  1. Advise full night, attended, CPAP titration study to optimize therapy.  (I would offer a sleep aid for that night to allow enough sleep time to be documented.    I certify that I have reviewed the entire raw data recording prior to the issuance of this report in accordance with the Standards of Accreditation of the American Academy of Sleep Medicine (AASM)   Susan Seat, [name] Diplomat, American Board of Neurology  Diplomat, American Board of Sleep Medicine Market researcher, [employer] [facility] [employer]

## 2020-06-04 NOTE — Progress Notes (Signed)
IMPRESSION: Extremely fragmented sleep, due to mostly spontaneous arousals.   1. Obstructive Sleep Apnea (OSA), but with a peculiar dominance in supine and in NREM sleep. NREM AHI was 20.2/h supine AHI was 30.5/h.     RECOMMENDATIONS:  1. Advise full night, attended, CPAP titration study to optimize therapy.  (I would offer a sleep aid for that night to allow enough sleep time to be documented.

## 2020-06-04 NOTE — Addendum Note (Signed)
Addended by: Larey Seat on: 06/04/2020 09:46 AM   Modules accepted: Orders

## 2020-06-04 NOTE — Telephone Encounter (Signed)
-----   Message from Larey Seat, MD sent at 06/04/2020  9:46 AM EDT ----- IMPRESSION: Extremely fragmented sleep, due to mostly spontaneous arousals.   1. Obstructive Sleep Apnea (OSA), but with a peculiar dominance in supine and in NREM sleep. NREM AHI was 20.2/h supine AHI was 30.5/h.     RECOMMENDATIONS:  1. Advise full night, attended, CPAP titration study to optimize therapy.  (I would offer a sleep aid for that night to allow enough sleep time to be documented.

## 2020-06-12 NOTE — Progress Notes (Signed)
[facility] Summary Report / Loop Recorder 

## 2020-06-16 NOTE — Patient Outreach (Signed)
[facility]) Care Management  06/16/2020  [name] [birth year] [id number]   No Telephone outreach to patient. Obtained mRS successfully while patient in Hospital scored completed by Amber on 05/10/20. MRS= 2.     [place] Management Assistant

## 2020-06-23 ENCOUNTER — Other Ambulatory Visit: Payer: Self-pay | Admitting: Physical Medicine and Rehabilitation

## 2020-06-24 ENCOUNTER — Ambulatory Visit (INDEPENDENT_AMBULATORY_CARE_PROVIDER_SITE_OTHER)
Admission: EM | Admit: 2020-06-24 | Discharge: 2020-06-24 | Disposition: A | Discharge status: Home / Self Care | Payer: Medicare PPO | Source: Home / Self Care

## 2020-06-24 ENCOUNTER — Encounter (HOSPITAL_COMMUNITY): Payer: Self-pay | Admitting: Emergency Medicine

## 2020-06-24 ENCOUNTER — Other Ambulatory Visit: Payer: Self-pay

## 2020-06-24 ENCOUNTER — Ambulatory Visit (INDEPENDENT_AMBULATORY_CARE_PROVIDER_SITE_OTHER): Payer: Medicare PPO

## 2020-06-24 DIAGNOSIS — R8290 Unspecified abnormal findings in urine: Secondary | ICD-10-CM

## 2020-06-24 DIAGNOSIS — R52 Pain, unspecified: Secondary | ICD-10-CM

## 2020-06-24 DIAGNOSIS — Z79899 Other long term (current) drug therapy: Secondary | ICD-10-CM | POA: Insufficient documentation

## 2020-06-24 DIAGNOSIS — J189 Pneumonia, unspecified organism: Secondary | ICD-10-CM | POA: Diagnosis not present

## 2020-06-24 DIAGNOSIS — R531 Weakness: Secondary | ICD-10-CM

## 2020-06-24 DIAGNOSIS — R051 Acute cough: Secondary | ICD-10-CM | POA: Insufficient documentation

## 2020-06-24 DIAGNOSIS — Z955 Presence of coronary angioplasty implant and graft: Secondary | ICD-10-CM | POA: Insufficient documentation

## 2020-06-24 DIAGNOSIS — R059 Cough, unspecified: Secondary | ICD-10-CM | POA: Diagnosis not present

## 2020-06-24 DIAGNOSIS — J181 Lobar pneumonia, unspecified organism: Secondary | ICD-10-CM | POA: Diagnosis not present

## 2020-06-24 DIAGNOSIS — Z20822 Contact with and (suspected) exposure to covid-19: Secondary | ICD-10-CM | POA: Insufficient documentation

## 2020-06-24 DIAGNOSIS — Z8673 Personal history of transient ischemic attack (TIA), and cerebral infarction without residual deficits: Secondary | ICD-10-CM | POA: Insufficient documentation

## 2020-06-24 DIAGNOSIS — Z7982 Long term (current) use of aspirin: Secondary | ICD-10-CM | POA: Insufficient documentation

## 2020-06-24 DIAGNOSIS — Z794 Long term (current) use of insulin: Secondary | ICD-10-CM | POA: Insufficient documentation

## 2020-06-24 LAB — POCT URINALYSIS DIPSTICK, ED / UC
Bilirubin Urine: NEGATIVE
Glucose, UA: NEGATIVE mg/dL
Hgb urine dipstick: NEGATIVE
Ketones, ur: NEGATIVE mg/dL
Nitrite: NEGATIVE
Protein, ur: >=300 mg/dL — AB
Specific Gravity, Urine: 1.02 (ref 1.005–1.030)
Urobilinogen, UA: 0.2 mg/dL (ref 0.0–1.0)
pH: 6 (ref 5.0–8.0)

## 2020-06-24 LAB — COMPREHENSIVE METABOLIC PANEL
ALT: 16 U/L (ref 0–44)
AST: 26 U/L (ref 15–41)
Albumin: 3.5 g/dL (ref 3.5–5.0)
Alkaline Phosphatase: 72 U/L (ref 38–126)
Anion gap: 12 (ref 5–15)
BUN: 83 mg/dL — ABNORMAL HIGH (ref 8–23)
CO2: 26 mmol/L (ref 22–32)
Calcium: 9.2 mg/dL (ref 8.9–10.3)
Chloride: 97 mmol/L — ABNORMAL LOW (ref 98–111)
Creatinine, Ser: 4.34 mg/dL — ABNORMAL HIGH (ref 0.44–1.00)
GFR, Estimated: 10 mL/min — ABNORMAL LOW (ref >60)
Glucose, Bld: 140 mg/dL — ABNORMAL HIGH (ref 70–99)
Potassium: 3.5 mmol/L (ref 3.5–5.1)
Sodium: 135 mmol/L (ref 135–145)
Total Bilirubin: 0.7 mg/dL (ref 0.3–1.2)
Total Protein: 6.7 g/dL (ref 6.5–8.1)

## 2020-06-24 LAB — CBC WITH DIFFERENTIAL/PLATELET
Abs Immature Granulocytes: 0.07 10*3/uL (ref 0.00–0.07)
Basophils Absolute: 0.1 10*3/uL (ref 0.0–0.1)
Basophils Relative: 0 %
Eosinophils Absolute: 0.1 10*3/uL (ref 0.0–0.5)
Eosinophils Relative: 0 %
HCT: 30.8 % — ABNORMAL LOW (ref 36.0–46.0)
Hemoglobin: 10 g/dL — ABNORMAL LOW (ref 12.0–15.0)
Immature Granulocytes: 0 %
Lymphocytes Relative: 12 %
Lymphs Abs: 2.1 10*3/uL (ref 0.7–4.0)
MCH: 28.5 pg (ref 26.0–34.0)
MCHC: 32.5 g/dL (ref 30.0–36.0)
MCV: 87.7 fL (ref 80.0–100.0)
Monocytes Absolute: 1.3 10*3/uL — ABNORMAL HIGH (ref 0.1–1.0)
Monocytes Relative: 8 %
Neutro Abs: 13.9 10*3/uL — ABNORMAL HIGH (ref 1.7–7.7)
Neutrophils Relative %: 80 %
Platelets: 228 10*3/uL (ref 150–400)
RBC: 3.51 MIL/uL — ABNORMAL LOW (ref 3.87–5.11)
RDW: 14.4 % (ref 11.5–15.5)
WBC: 17.5 10*3/uL — ABNORMAL HIGH (ref 4.0–10.5)
nRBC: 0 % (ref 0.0–0.2)

## 2020-06-24 LAB — POC INFLUENZA A AND B ANTIGEN (URGENT CARE ONLY)
INFLUENZA A ANTIGEN, POC: NEGATIVE
INFLUENZA B ANTIGEN, POC: NEGATIVE

## 2020-06-24 MED ORDER — AMOXICILLIN-POT CLAVULANATE 500-125 MG PO TABS: 1.0000 | ORAL_TABLET | Freq: Every day | ORAL | 0 refills | Status: DC

## 2020-06-24 MED ORDER — DOXYCYCLINE HYCLATE 100 MG PO CAPS: 100.0000 mg | ORAL_CAPSULE | Freq: Two times a day (BID) | ORAL | 0 refills | Status: DC

## 2020-06-24 NOTE — ED Triage Notes (Signed)
General body aches, weak, cold and coughing all day

## 2020-06-24 NOTE — ED Provider Notes (Addendum)
[place]    CSN: [id number] Arrival date & time: 06/24/20  1746      History   Chief Complaint Chief Complaint  Patient presents with  . Generalized Body Aches    HPI [name] is a 80 y.o. female.   Patient presents today with a 1 day history of cough.  Reports associated body aches that are worse in her back and hips, headache, sore throat, nasal congestion, fatigue.  Denies fever, chest pain, nausea, vomiting.  She does report some shortness of breath.  She denies any known sick contacts.  Reports she is up-to-date on influenza and COVID-19 vaccinations including boosters.  Denies any recent antibiotic use.  Denies history of allergies, asthma, COPD, smoking.  She denies any urinary symptoms.  Reports she is feeling very fatigued and generally weak.  She denies any abnormal bleeding including hematuria, melena, hematochezia.  She denies any recent medication changes or recent travel.  She has not tried anything for symptom management; called PCP who recommended evaluation.     Past Medical History:  Diagnosis Date  . Arthritis   . Cerebrovascular disease   . CKD (chronic kidney disease)    Sees Dr Hollie Salk  . Coronary artery disease   . Depression   . Diabetes ([place])    INSULIN DEPENDENT  . Diabetic peripheral neuropathy ([place])   . Diabetic retinopathy ([place])   . Diverticulitis   . GERD (gastroesophageal reflux disease)   . History of CVA (cerebrovascular accident)    x 2 no residulal  . Hyperlipidemia   . Hypertension   . Obesity   . Renal lesion   . S/P TAVR (transcatheter aortic valve replacement) 11/13/2018   s/p TAVR with a 68m Edwards S3U via the TF approach by Dr. ORoxy Mannsand Dr. MAngelena Form . Severe aortic stenosis     Patient Active Problem List   Diagnosis Date Noted  . PVD (peripheral vascular disease) with claudication ([place] 06/04/2020  . Cardiovascular renal disease, stage 1-4 or unspecified chronic kidney disease, without heart failure  05/13/2020  . SOB (shortness of breath) 05/12/2020  . Cerebrovascular accident (CVA) due to embolism of right posterior cerebral artery ([facility] 05/11/2020  . CHF (congestive heart failure) ([facility] 05/11/2020  . Stable treated proliferative diabetic retinopathy of right eye without macular edema determined by examination associated with type 2 diabetes mellitus ([place] 03/02/2020  . Posterior vitreous detachment of right eye 03/02/2020  . Anemia 02/28/2020  . Acute on chronic renal failure ([facility] 02/28/2020  . Hypokalemia   . Diabetic peripheral neuropathy ([facility]   . Benign essential HTN   . Cerebral infarction due to embolism of right anterior cerebral artery ([facility] 02/19/2020  . Ischemic stroke of frontal lobe ([place] 02/17/2020  . Cerebral embolism with cerebral infarction 02/12/2020  . Thrombocytopenia ([facility] 02/10/2020  . Acute left-sided weakness 02/09/2020  . Weakness 02/08/2020  . Chronic heart failure with preserved ejection fraction ([facility] 01/02/2020  . Atypical chest pain 05/31/2019  . Central retinal vein occlusion, left eye, stable 05/29/2019  . Central retinal vein occlusion of left eye 05/28/2019  . Proliferative diabetic retinopathy of left eye with macular edema associated with type 2 diabetes mellitus ([place] 05/28/2019  . Acute on chronic combined systolic and diastolic CHF (congestive heart failure) ([facility] 11/13/2018  . S/P TAVR (transcatheter aortic valve replacement) 11/13/2018  . Hypertension   . Hyperlipidemia   . Renal lesion   . Severe aortic stenosis 10/23/2018  . CKD (chronic kidney disease), stage  IV ([place]) 12/05/2017  . Coronary artery disease of native artery of native heart with stable angina pectoris ([place]) 01/07/2014  . S/P PTCA (percutaneous transluminal coronary angioplasty) 01/07/2014  . History of ischemic anterior cerebral artery stroke 01/30/2013    Past Surgical History:  Procedure Laterality Date  . ABDOMINAL HYSTERECTOMY    . AV FISTULA PLACEMENT Left  12/18/2017   Procedure: ARTERIOVENOUS (AV) FISTULA CREATION ARM;  Surgeon: Marty Heck, MD;  Location: [facility];  Service: Vascular;  Laterality: Left;  . bilateral cataract surgery    . CARDIAC CATHETERIZATION  01/07/2014   DR [place] RESECTION  02/1999  . COLONOSCOPY    . COLOSTOMY  02/1999  . COLOSTOMY CLOSURE  07/1999  . EYE SURGERY Left 2019  . FISTULA SUPERFICIALIZATION Left 06/29/2018   Procedure: FISTULA SUPERFICIALIZATION LEFT BRACHIOCEPHALIC;  Surgeon: Marty Heck, MD;  Location: [facility];  Service: Vascular;  Laterality: Left;  . LEFT HEART CATHETERIZATION WITH CORONARY ANGIOGRAM N/A 01/07/2014   Procedure: LEFT HEART CATHETERIZATION WITH CORONARY ANGIOGRAM;  Surgeon: Laverda Page, MD;  Location: [facility] CATH LAB;  Service: Cardiovascular;  Laterality: N/A;  . LOOP RECORDER INSERTION N/A 02/17/2020   Procedure: LOOP RECORDER INSERTION;  Surgeon: Deboraha Sprang, MD;  Location: [facility] CV LAB;  Service: Cardiovascular;  Laterality: N/A;  . middle cerebral artery stent placement Right   . OTHER SURGICAL HISTORY     laser surgery  . PTCA  01/07/2014   DES to RCA    DR Einar Gip  . right knee surgery Right    for infection  . RIGHT/LEFT HEART CATH AND CORONARY ANGIOGRAPHY N/A 10/23/2018   Procedure: RIGHT/LEFT HEART CATH AND CORONARY ANGIOGRAPHY;  Surgeon: Nigel Mormon, MD;  Location: [facility] CV LAB;  Service: Cardiovascular;  Laterality: N/A;  . TEE WITHOUT CARDIOVERSION N/A 11/13/2018   Procedure: TRANSESOPHAGEAL ECHOCARDIOGRAM (TEE);  Surgeon: Burnell Blanks, MD;  Location: [facility];  Service: Open Heart Surgery;  Laterality: N/A;  . TRANSCATHETER AORTIC VALVE REPLACEMENT, TRANSFEMORAL  11/13/2018  . TRANSCATHETER AORTIC VALVE REPLACEMENT, TRANSFEMORAL N/A 11/13/2018   Procedure: TRANSCATHETER AORTIC VALVE REPLACEMENT, TRANSFEMORAL;  Surgeon: Burnell Blanks, MD;  Location: [facility];  Service: Open Heart Surgery;  Laterality: N/A;    OB  History   No obstetric history on file.      Home Medications    Prior to Admission medications   Medication Sig Start Date End Date Taking? Authorizing Provider  amoxicillin-clavulanate (AUGMENTIN) 500-125 MG tablet Take 1 tablet (500 mg total) by mouth daily for 10 days. 06/24/20 07/04/20 Yes Carleah Yablonski, Derry Skill, PA-C  doxycycline (VIBRAMYCIN) 100 MG capsule Take 1 capsule (100 mg total) by mouth 2 (two) times daily. 06/24/20  Yes Shalinda Burkholder, Derry Skill, PA-C  acetaminophen (TYLENOL) 325 MG tablet Take 650 mg by mouth every 6 (six) hours as needed for moderate pain or headache.     [provider]  allopurinol (ZYLOPRIM) 100 MG tablet Take 50 mg by mouth daily.    [provider]  amLODipine (NORVASC) 10 MG tablet TAKE 1 TABLET BY MOUTH EVERY DAY Patient taking differently: Take 10 mg by mouth daily. 11/11/19   Patwardhan, Reynold Bowen, MD  aspirin EC 81 MG tablet Take 1 tablet (81 mg total) by mouth daily. Swallow whole. 02/17/20 02/16/21  Riesa Pope, MD  brimonidine (ALPHAGAN) 0.2 % ophthalmic solution Place into the left eye once. 04/19/19   [provider]  calcitRIOL (ROCALTROL) 0.25 MCG capsule Take 0.25  mcg by mouth daily.     [provider]  cetirizine (ZYRTEC) 10 MG tablet Take 10 mg by mouth daily as needed for allergies.    [provider]  Evolocumab (REPATHA SURECLICK) XX123456 MG/ML SOAJ Inject 1 mL into the skin every 14 (fourteen) days.    [provider]  Evolocumab (REPATHA SURECLICK) XX123456 MG/ML SOAJ Inject 1 mL into the skin every 14 (fourteen) days. 05/26/20   Patwardhan, Reynold Bowen, MD  ezetimibe (ZETIA) 10 MG tablet Take 10 mg by mouth daily. 04/22/20   [provider]  folic acid (FOLVITE) 1 MG tablet Take 1 mg by mouth daily. 12/07/12   [provider]  HUMALOG KWIKPEN 100 UNIT/ML KwikPen Inject 2-8 Units into the skin 3 (three) times daily before meals.  05/28/18   [provider]  hydrALAZINE (APRESOLINE) 50  MG tablet Take 1 tablet (50 mg total) by mouth every 8 (eight) hours. 02/26/20   Love, Ivan Anchors, PA-C  insulin glargine (LANTUS) 100 UNIT/ML injection Inject 0.12 mLs (12 Units total) into the skin at bedtime. Patient taking differently: Inject 10 Units into the skin at bedtime. 02/26/20   Love, Ivan Anchors, PA-C  isosorbide mononitrate (IMDUR) 120 MG 24 hr tablet Take 1 tablet (120 mg total) by mouth daily. 05/19/20   C[place]well, Celeste C, PA-C  labetalol (NORMODYNE) 300 MG tablet Take 1 tablet (300 mg total) by mouth 2 (two) times daily. 05/15/20   Nita Sells, MD  nitroGLYCERIN (NITROSTAT) 0.4 MG SL tablet PLACE 1 TABLET (0.4 MG TOTAL) UNDER THE TONGUE EVERY 5 MINUTES X 3 DOSES AS NEEDED FOR CHEST PAIN. Patient taking differently: Place 0.4 mg under the tongue every 5 (five) minutes as needed for chest pain. 01/03/19   Miquel Dunn, NP  polyethylene glycol (MIRALAX / GLYCOLAX) 17 g packet Take 17 g by mouth daily. Patient taking differently: Take 17 g by mouth daily as needed for moderate constipation. 02/27/20   Love, Ivan Anchors, PA-C  polyvinyl alcohol (LIQUIFILM TEARS) 1.4 % ophthalmic solution Place 1 drop into both eyes 3 (three) times daily as needed for dry eyes.    [provider]  rosuvastatin (CRESTOR) 40 MG tablet Take 1 tablet (40 mg total) by mouth daily. 02/26/20   Love, Ivan Anchors, PA-C  senna-docusate (SENOKOT-S) 8.6-50 MG tablet Take 2 tablets by mouth 2 (two) times daily. 02/26/20   Love, Ivan Anchors, PA-C  torsemide (DEMADEX) 20 MG tablet Take 2 tablets (40 mg total) by mouth daily. 02/26/20   Love, Ivan Anchors, PA-C  venlafaxine XR (EFFEXOR-XR) 150 MG 24 hr capsule Take 1 capsule (150 mg total) by mouth daily. 02/27/20   Bary Leriche, PA-C    Family History Family History  Problem Relation Age of Onset  . Diabetes Mother   . Heart disease Mother   . Prostate cancer Father   . Hypertension Brother   . Prostate cancer Brother     Social History Social History    Tobacco Use  . Smoking status: Never Smoker  . Smokeless tobacco: Never Used  Vaping Use  . Vaping Use: Never used  Substance Use Topics  . Alcohol use: No  . Drug use: No     Allergies   Patient has no known allergies.   Review of Systems Review of Systems  Constitutional: Positive for activity change and fatigue. Negative for appetite change and fever.  HENT: Negative for congestion, sinus pressure, sneezing and sore throat.   Respiratory: Positive for  cough and shortness of breath.   Cardiovascular: Negative for chest pain.  Gastrointestinal: Negative for abdominal pain, diarrhea, nausea and vomiting.  Musculoskeletal: Positive for arthralgias, back pain and myalgias.  Neurological: Positive for headaches. Negative for dizziness, weakness and light-headedness.     Physical Exam Triage Vital Signs ED Triage Vitals  Enc Vitals Group     BP 06/24/20 1829 (!) 182/71     Pulse Rate 06/24/20 1829 67     Resp 06/24/20 1829 (!) 21     Temp 06/24/20 1829 98.4 F (36.9 C)     Temp Source 06/24/20 1829 Oral     SpO2 06/24/20 1829 96 %     Weight --      Height --      Head Circumference --      Peak Flow --      Pain Score 06/24/20 1826 7     Pain Loc --      Pain Edu? --      Excl. in [place]? --    No data found.  Updated Vital Signs BP (!) 170/60   Pulse 85   Temp 98.4 F (36.9 C) (Oral)   Resp 20   SpO2 97%   Visual Acuity Right Eye Distance:   Left Eye Distance:   Bilateral Distance:    Right Eye Near:   Left Eye Near:    Bilateral Near:     Physical Exam Vitals reviewed.  Constitutional:      General: She is awake. She is not in acute distress.    Appearance: Normal appearance. She is normal weight. She is not ill-appearing.     Comments: Very pleasant female appears stated age sitting comfortably in exam room in no acute distress  HENT:     Head: Normocephalic and atraumatic.     Right Ear: Tympanic membrane, ear canal and external ear normal.  Tympanic membrane is not erythematous or bulging.     Left Ear: Tympanic membrane, ear canal and external ear normal. Tympanic membrane is not erythematous or bulging.     Nose:     Right Sinus: No maxillary sinus tenderness or frontal sinus tenderness.     Left Sinus: No maxillary sinus tenderness or frontal sinus tenderness.     Mouth/Throat:     Pharynx: Uvula midline. No oropharyngeal exudate or posterior oropharyngeal erythema.  Cardiovascular:     Rate and Rhythm: Normal rate and regular rhythm.     Heart sounds: Murmur heard.    Pulmonary:     Effort: Pulmonary effort is normal.     Breath sounds: Rhonchi present. No wheezing or rales.     Comments: Rhonchi scattered throughout lung fields partially clear with cough Abdominal:     General: Bowel sounds are normal.     Palpations: Abdomen is soft.     Tenderness: There is no abdominal tenderness.  Lymphadenopathy:     Head:     Right side of head: No submental, submandibular or tonsillar adenopathy.     Left side of head: No submental, submandibular or tonsillar adenopathy.     Cervical: No cervical adenopathy.  Psychiatric:        Behavior: Behavior is cooperative.      UC Treatments / Results  Labs (all labs ordered are listed, but only abnormal results are displayed) Labs Reviewed  POCT URINALYSIS DIPSTICK, ED / UC - Abnormal; Notable for the following components:      Result Value   Protein, ur >=  300 (*)    Leukocytes,Ua SMALL (*)    All other components within normal limits  SARS CORONAVIRUS 2 (TAT 6-24 HRS)  URINE CULTURE  COMPREHENSIVE METABOLIC PANEL  CBC WITH DIFFERENTIAL/PLATELET  POC INFLUENZA A AND B ANTIGEN (URGENT CARE ONLY)    EKG   Radiology DG Chest 2 View  Result Date: 06/24/2020 CLINICAL DATA:  Cough. EXAM: CHEST - 2 VIEW COMPARISON:  May 12, 2020 FINDINGS: Mild patchy infiltrate is seen within the posterior aspect of the right upper lobe. Mild diffusely increased interstitial lung  markings are also noted. There is no evidence of a pleural effusion or pneumothorax. There is stable cardiomegaly. An artificial aortic valve is seen. A radiopaque loop recorder device is noted. The visualized skeletal structures are unremarkable. IMPRESSION: Mild right upper lobe infiltrate. Electronically Signed   By: Virgina Norfolk [name]   On: 06/24/2020 19:03    Procedures Procedures (including critical care time)  Medications Ordered in UC Medications - No data to display  Initial Impression / Assessment and Plan / UC Course  I have reviewed the triage vital signs and the nursing notes.  Pertinent labs & imaging results that were available during my care of the patient were reviewed by me and considered in my medical decision making (see chart for details).     Flu test was negative.  COVID-19 test is pending.  CBC and CMP obtained today-results pending.  UA did show trace leukocyte Estrace we discussed that Augmentin should cover for urinary tract infection as well.  Urine culture was obtained-results pending.  Discussed potential need to change antibiotic based on susceptibilities identified on culture.  X-ray showed right upper lobe infiltrate.  Patient started on doxycycline and Augmentin.  Augmentin was dosed at 500/125 once daily based on calculated creatinine clearance of 14.77 mL/min based on CMP from 05/14/2020.  Discussed that based on curb 65 score she would intentionally qualify for inpatient admission but patient was not interested and going to the ER so we will start antibiotics outpatient but discussed she would need to follow-up with either our clinic or PCP tomorrow.  Discussed alarm symptoms that would warrant emergent evaluation.  Strict return precautions given to which patient expressed understanding.  Final Clinical Impressions(s) / UC Diagnoses   Final diagnoses:  Pneumonia of right upper lobe due to infectious organism  Cough  Body aches  Generalized weakness   Abnormal urinalysis     Discharge Instructions     Please start antibiotics (doxycycline twice daily for 10 days and Augmentin once daily for 10 days) immediately.  If you have any worsening symptoms as we discussed you need to go to the emergency room overnight.  It is imperative that you follow-up with someone tomorrow.  If you are not able to get in with your primary care provider please come back to see [name].  If you have any chest pain, worsening shortness of breath, nausea, vomiting, confusion, increased weakness you need to go to the emergency room immediately.    ED Prescriptions    Medication Sig Dispense Auth. Provider   doxycycline (VIBRAMYCIN) 100 MG capsule Take 1 capsule (100 mg total) by mouth 2 (two) times daily. 20 capsule Kalob Bergen K, PA-C   amoxicillin-clavulanate (AUGMENTIN) 500-125 MG tablet Take 1 tablet (500 mg total) by mouth daily for 10 days. 10 tablet Maliha Outten, Derry Skill, PA-C     PDMP not reviewed this encounter.   Terrilee Croak, PA-C 06/24/20 1920    Elleah Hemsley, Junie Panning  K, PA-C 06/24/20 1932

## 2020-06-24 NOTE — Discharge Instructions (Addendum)
Please start antibiotics (doxycycline twice daily for 10 days and Augmentin once daily for 10 days) immediately.  If you have any worsening symptoms as we discussed you need to go to the emergency room overnight.  It is imperative that you follow-up with someone tomorrow.  If you are not able to get in with your primary care provider please come back to see [name].  If you have any chest pain, worsening shortness of breath, nausea, vomiting, confusion, increased weakness you need to go to the emergency room immediately.

## 2020-06-25 ENCOUNTER — Other Ambulatory Visit: Payer: Self-pay

## 2020-06-25 ENCOUNTER — Emergency Department (HOSPITAL_COMMUNITY): Payer: Medicare PPO

## 2020-06-25 ENCOUNTER — Encounter (HOSPITAL_COMMUNITY): Payer: Self-pay

## 2020-06-25 ENCOUNTER — Encounter (HOSPITAL_COMMUNITY): Payer: Self-pay | Admitting: Emergency Medicine

## 2020-06-25 ENCOUNTER — Inpatient Hospital Stay (HOSPITAL_COMMUNITY)
Admission: EM | Admit: 2020-06-25 | Discharge: 2020-06-27 | DRG: 194 | Disposition: A | Discharge status: Home Health | Payer: Medicare PPO | Attending: Internal Medicine | Admitting: Internal Medicine

## 2020-06-25 ENCOUNTER — Ambulatory Visit (HOSPITAL_COMMUNITY)
Admission: EM | Admit: 2020-06-25 | Discharge: 2020-06-25 | Disposition: A | Discharge status: Home / Self Care | Payer: Medicare PPO | Attending: Internal Medicine | Admitting: Internal Medicine

## 2020-06-25 DIAGNOSIS — Z8249 Family history of ischemic heart disease and other diseases of the circulatory system: Secondary | ICD-10-CM

## 2020-06-25 DIAGNOSIS — R8290 Unspecified abnormal findings in urine: Secondary | ICD-10-CM | POA: Diagnosis present

## 2020-06-25 DIAGNOSIS — I509 Heart failure, unspecified: Secondary | ICD-10-CM

## 2020-06-25 DIAGNOSIS — R7401 Elevation of levels of liver transaminase levels: Secondary | ICD-10-CM | POA: Diagnosis not present

## 2020-06-25 DIAGNOSIS — F32A Depression, unspecified: Secondary | ICD-10-CM | POA: Diagnosis present

## 2020-06-25 DIAGNOSIS — I2510 Atherosclerotic heart disease of native coronary artery without angina pectoris: Secondary | ICD-10-CM | POA: Diagnosis present

## 2020-06-25 DIAGNOSIS — J181 Lobar pneumonia, unspecified organism: Principal | ICD-10-CM | POA: Diagnosis present

## 2020-06-25 DIAGNOSIS — E8809 Other disorders of plasma-protein metabolism, not elsewhere classified: Secondary | ICD-10-CM | POA: Diagnosis present

## 2020-06-25 DIAGNOSIS — I440 Atrioventricular block, first degree: Secondary | ICD-10-CM | POA: Diagnosis present

## 2020-06-25 DIAGNOSIS — Z955 Presence of coronary angioplasty implant and graft: Secondary | ICD-10-CM

## 2020-06-25 DIAGNOSIS — J189 Pneumonia, unspecified organism: Secondary | ICD-10-CM | POA: Diagnosis present

## 2020-06-25 DIAGNOSIS — Z833 Family history of diabetes mellitus: Secondary | ICD-10-CM

## 2020-06-25 DIAGNOSIS — Z90710 Acquired absence of both cervix and uterus: Secondary | ICD-10-CM

## 2020-06-25 DIAGNOSIS — I5032 Chronic diastolic (congestive) heart failure: Secondary | ICD-10-CM | POA: Diagnosis present

## 2020-06-25 DIAGNOSIS — E785 Hyperlipidemia, unspecified: Secondary | ICD-10-CM | POA: Diagnosis present

## 2020-06-25 DIAGNOSIS — T50905A Adverse effect of unspecified drugs, medicaments and biological substances, initial encounter: Secondary | ICD-10-CM | POA: Diagnosis not present

## 2020-06-25 DIAGNOSIS — E11319 Type 2 diabetes mellitus with unspecified diabetic retinopathy without macular edema: Secondary | ICD-10-CM | POA: Diagnosis present

## 2020-06-25 DIAGNOSIS — R778 Other specified abnormalities of plasma proteins: Secondary | ICD-10-CM

## 2020-06-25 DIAGNOSIS — I130 Hypertensive heart and chronic kidney disease with heart failure and stage 1 through stage 4 chronic kidney disease, or unspecified chronic kidney disease: Secondary | ICD-10-CM | POA: Diagnosis present

## 2020-06-25 DIAGNOSIS — N179 Acute kidney failure, unspecified: Secondary | ICD-10-CM | POA: Diagnosis present

## 2020-06-25 DIAGNOSIS — K219 Gastro-esophageal reflux disease without esophagitis: Secondary | ICD-10-CM | POA: Diagnosis present

## 2020-06-25 DIAGNOSIS — M069 Rheumatoid arthritis, unspecified: Secondary | ICD-10-CM | POA: Diagnosis present

## 2020-06-25 DIAGNOSIS — N184 Chronic kidney disease, stage 4 (severe): Secondary | ICD-10-CM | POA: Diagnosis present

## 2020-06-25 DIAGNOSIS — D631 Anemia in chronic kidney disease: Secondary | ICD-10-CM | POA: Diagnosis present

## 2020-06-25 DIAGNOSIS — J160 Chlamydial pneumonia: Secondary | ICD-10-CM

## 2020-06-25 DIAGNOSIS — R031 Nonspecific low blood-pressure reading: Secondary | ICD-10-CM | POA: Diagnosis present

## 2020-06-25 DIAGNOSIS — R001 Bradycardia, unspecified: Secondary | ICD-10-CM | POA: Diagnosis present

## 2020-06-25 DIAGNOSIS — E1142 Type 2 diabetes mellitus with diabetic polyneuropathy: Secondary | ICD-10-CM | POA: Diagnosis present

## 2020-06-25 DIAGNOSIS — M1990 Unspecified osteoarthritis, unspecified site: Secondary | ICD-10-CM | POA: Diagnosis present

## 2020-06-25 DIAGNOSIS — E1122 Type 2 diabetes mellitus with diabetic chronic kidney disease: Secondary | ICD-10-CM | POA: Diagnosis present

## 2020-06-25 DIAGNOSIS — Z20822 Contact with and (suspected) exposure to covid-19: Secondary | ICD-10-CM | POA: Diagnosis present

## 2020-06-25 DIAGNOSIS — Z8673 Personal history of transient ischemic attack (TIA), and cerebral infarction without residual deficits: Secondary | ICD-10-CM

## 2020-06-25 DIAGNOSIS — E876 Hypokalemia: Secondary | ICD-10-CM | POA: Diagnosis present

## 2020-06-25 DIAGNOSIS — Z794 Long term (current) use of insulin: Secondary | ICD-10-CM

## 2020-06-25 DIAGNOSIS — R0602 Shortness of breath: Secondary | ICD-10-CM

## 2020-06-25 DIAGNOSIS — Z952 Presence of prosthetic heart valve: Secondary | ICD-10-CM

## 2020-06-25 LAB — CBC WITH DIFFERENTIAL/PLATELET
Abs Immature Granulocytes: 0.13 10*3/uL — ABNORMAL HIGH (ref 0.00–0.07)
Basophils Absolute: 0.1 10*3/uL (ref 0.0–0.1)
Basophils Relative: 0 %
Eosinophils Absolute: 0 10*3/uL (ref 0.0–0.5)
Eosinophils Relative: 0 %
HCT: 28.6 % — ABNORMAL LOW (ref 36.0–46.0)
Hemoglobin: 9.3 g/dL — ABNORMAL LOW (ref 12.0–15.0)
Immature Granulocytes: 1 %
Lymphocytes Relative: 9 %
Lymphs Abs: 1.7 10*3/uL (ref 0.7–4.0)
MCH: 28.6 pg (ref 26.0–34.0)
MCHC: 32.5 g/dL (ref 30.0–36.0)
MCV: 88 fL (ref 80.0–100.0)
Monocytes Absolute: 1.7 10*3/uL — ABNORMAL HIGH (ref 0.1–1.0)
Monocytes Relative: 9 %
Neutro Abs: 15.5 10*3/uL — ABNORMAL HIGH (ref 1.7–7.7)
Neutrophils Relative %: 81 %
Platelets: 185 10*3/uL (ref 150–400)
RBC: 3.25 MIL/uL — ABNORMAL LOW (ref 3.87–5.11)
RDW: 14.5 % (ref 11.5–15.5)
WBC: 19.1 10*3/uL — ABNORMAL HIGH (ref 4.0–10.5)
nRBC: 0 % (ref 0.0–0.2)

## 2020-06-25 LAB — COMPREHENSIVE METABOLIC PANEL
ALT: 16 U/L (ref 0–44)
AST: 26 U/L (ref 15–41)
Albumin: 3 g/dL — ABNORMAL LOW (ref 3.5–5.0)
Alkaline Phosphatase: 62 U/L (ref 38–126)
Anion gap: 14 (ref 5–15)
BUN: 89 mg/dL — ABNORMAL HIGH (ref 8–23)
CO2: 22 mmol/L (ref 22–32)
Calcium: 8.7 mg/dL — ABNORMAL LOW (ref 8.9–10.3)
Chloride: 96 mmol/L — ABNORMAL LOW (ref 98–111)
Creatinine, Ser: 4.76 mg/dL — ABNORMAL HIGH (ref 0.44–1.00)
GFR, Estimated: 9 mL/min — ABNORMAL LOW (ref >60)
Glucose, Bld: 275 mg/dL — ABNORMAL HIGH (ref 70–99)
Potassium: 3 mmol/L — ABNORMAL LOW (ref 3.5–5.1)
Sodium: 132 mmol/L — ABNORMAL LOW (ref 135–145)
Total Bilirubin: 0.8 mg/dL (ref 0.3–1.2)
Total Protein: 6.2 g/dL — ABNORMAL LOW (ref 6.5–8.1)

## 2020-06-25 LAB — CREATININE, SERUM
Creatinine, Ser: 5.16 mg/dL — ABNORMAL HIGH (ref 0.44–1.00)
GFR, Estimated: 8 mL/min — ABNORMAL LOW (ref >60)

## 2020-06-25 LAB — TROPONIN I (HIGH SENSITIVITY)
Troponin I (High Sensitivity): 137 ng/L (ref <18)
Troponin I (High Sensitivity): 125 ng/L (ref <18)

## 2020-06-25 LAB — PHOSPHORUS: Phosphorus: 4.2 mg/dL (ref 2.5–4.6)

## 2020-06-25 LAB — GLUCOSE, CAPILLARY: Glucose-Capillary: 265 mg/dL — ABNORMAL HIGH (ref 70–99)

## 2020-06-25 LAB — BRAIN NATRIURETIC PEPTIDE: B Natriuretic Peptide: 1345.1 pg/mL — ABNORMAL HIGH (ref 0.0–100.0)

## 2020-06-25 LAB — SARS CORONAVIRUS 2 (TAT 6-24 HRS): SARS Coronavirus 2: NEGATIVE

## 2020-06-25 LAB — MAGNESIUM: Magnesium: 1.7 mg/dL (ref 1.7–2.4)

## 2020-06-25 MED ORDER — SODIUM CHLORIDE 0.9 % IV SOLN
1.0000 g | Freq: Once | INTRAVENOUS | Status: AC
  Administered 2020-06-25: 1 g via INTRAVENOUS
  Filled 2020-06-25: qty 10

## 2020-06-25 MED ORDER — INSULIN ASPART 100 UNIT/ML IJ SOLN
2.0000 [IU] | Freq: Three times a day (TID) | INTRAMUSCULAR | Status: DC
  Administered 2020-06-26 – 2020-06-27 (×5): 2 [IU] via SUBCUTANEOUS

## 2020-06-25 MED ORDER — AZITHROMYCIN 250 MG PO TABS
500.0000 mg | ORAL_TABLET | Freq: Once | ORAL | Status: AC
  Administered 2020-06-25: 500 mg via ORAL
  Filled 2020-06-25: qty 2

## 2020-06-25 MED ORDER — ROSUVASTATIN CALCIUM 20 MG PO TABS
40.0000 mg | ORAL_TABLET | Freq: Every day | ORAL | Status: DC
  Administered 2020-06-26 – 2020-06-27 (×2): 40 mg via ORAL
  Filled 2020-06-25 (×2): qty 2

## 2020-06-25 MED ORDER — ENOXAPARIN SODIUM 30 MG/0.3ML IJ SOSY
30.0000 mg | PREFILLED_SYRINGE | INTRAMUSCULAR | Status: DC
  Administered 2020-06-25 – 2020-06-26 (×2): 30 mg via SUBCUTANEOUS
  Filled 2020-06-25 (×2): qty 0.3

## 2020-06-25 MED ORDER — SODIUM CHLORIDE 0.9 % IV SOLN
1.0000 g | INTRAVENOUS | Status: DC
  Filled 2020-06-25: qty 10

## 2020-06-25 MED ORDER — BRIMONIDINE TARTRATE 0.2 % OP SOLN
1.0000 [drp] | Freq: Every day | OPHTHALMIC | Status: DC
  Administered 2020-06-26 – 2020-06-27 (×2): 1 [drp] via OPHTHALMIC
  Filled 2020-06-25: qty 5

## 2020-06-25 MED ORDER — INSULIN ASPART 100 UNIT/ML IJ SOLN
0.0000 [IU] | Freq: Every day | INTRAMUSCULAR | Status: DC
  Administered 2020-06-25: 3 [IU] via SUBCUTANEOUS

## 2020-06-25 MED ORDER — INSULIN ASPART 100 UNIT/ML IJ SOLN
0.0000 [IU] | Freq: Three times a day (TID) | INTRAMUSCULAR | Status: DC
  Administered 2020-06-26: 3 [IU] via SUBCUTANEOUS
  Administered 2020-06-26 (×2): 2 [IU] via SUBCUTANEOUS
  Administered 2020-06-27: 1 [IU] via SUBCUTANEOUS
  Administered 2020-06-27: 2 [IU] via SUBCUTANEOUS

## 2020-06-25 MED ORDER — AZITHROMYCIN 250 MG PO TABS
250.0000 mg | ORAL_TABLET | Freq: Every day | ORAL | Status: DC
  Administered 2020-06-26 – 2020-06-27 (×2): 250 mg via ORAL
  Filled 2020-06-25 (×2): qty 1

## 2020-06-25 MED ORDER — FOLIC ACID 1 MG PO TABS
1.0000 mg | ORAL_TABLET | Freq: Every day | ORAL | Status: DC
  Administered 2020-06-26 – 2020-06-27 (×2): 1 mg via ORAL
  Filled 2020-06-25 (×2): qty 1

## 2020-06-25 MED ORDER — POTASSIUM CHLORIDE CRYS ER 20 MEQ PO TBCR
40.0000 meq | EXTENDED_RELEASE_TABLET | Freq: Once | ORAL | Status: AC
  Administered 2020-06-25: 40 meq via ORAL
  Filled 2020-06-25: qty 2

## 2020-06-25 MED ORDER — VENLAFAXINE HCL ER 150 MG PO CP24
150.0000 mg | ORAL_CAPSULE | Freq: Every day | ORAL | Status: DC
  Administered 2020-06-26 – 2020-06-27 (×2): 150 mg via ORAL
  Filled 2020-06-25 (×2): qty 1

## 2020-06-25 MED ORDER — CALCITRIOL 0.25 MCG PO CAPS
0.2500 ug | ORAL_CAPSULE | Freq: Every day | ORAL | Status: DC
  Administered 2020-06-26 – 2020-06-27 (×2): 0.25 ug via ORAL
  Filled 2020-06-25 (×2): qty 1

## 2020-06-25 MED ORDER — ASPIRIN EC 81 MG PO TBEC
81.0000 mg | DELAYED_RELEASE_TABLET | Freq: Every day | ORAL | Status: DC
  Administered 2020-06-26 – 2020-06-27 (×2): 81 mg via ORAL
  Filled 2020-06-25 (×2): qty 1

## 2020-06-25 MED ORDER — FUROSEMIDE 10 MG/ML IJ SOLN
40.0000 mg | Freq: Once | INTRAMUSCULAR | Status: AC
  Administered 2020-06-25: 40 mg via INTRAVENOUS
  Filled 2020-06-25: qty 4

## 2020-06-25 MED ORDER — INSULIN GLARGINE 100 UNIT/ML ~~LOC~~ SOLN
10.0000 [IU] | Freq: Every day | SUBCUTANEOUS | Status: DC
  Administered 2020-06-25 – 2020-06-26 (×2): 10 [IU] via SUBCUTANEOUS
  Filled 2020-06-25 (×3): qty 0.1

## 2020-06-25 MED ORDER — SENNOSIDES-DOCUSATE SODIUM 8.6-50 MG PO TABS: 2.0000 | ORAL_TABLET | Freq: Two times a day (BID) | ORAL | Status: DC | PRN

## 2020-06-25 MED ORDER — POLYVINYL ALCOHOL 1.4 % OP SOLN: 1.0000 [drp] | Freq: Three times a day (TID) | OPHTHALMIC | Status: DC | PRN

## 2020-06-25 NOTE — ED Triage Notes (Signed)
Pt states feels weak and trouble breathing since yesterday. States was told yesterday to come back if not feeling better. Denies chest pain. Pt endorses cough, congestion, but body aches improved from yesterday.

## 2020-06-25 NOTE — ED Provider Notes (Signed)
[place]    CSN: [id number] Arrival date & time: 06/25/20  1027      History   Chief Complaint No chief complaint on file.   HPI [name] is a 80 y.o. female.   I was contacted by nursing staff to evaluate patient in triage.  She was seen yesterday (06/24/2020) at which point x-ray showed pneumonia and she was started on doxycycline and Augmentin that was dose adjusted based on renal function.  She was encouraged to go to the emergency room based on curb 65 score yesterday but patient declined this and preferred outpatient antibiotics.  Overnight she reports persistent symptoms prompting reevaluation.  She was noted to have significant leukocytosis with a white count of 17.5 as well as increased creatinine from baseline.  She denies any chest pain, fever, dizziness, increased shortness of breath but states she is just not feeling well and is generally weak.   Past Medical History:  Diagnosis Date   Arthritis    Cerebrovascular disease    CKD (chronic kidney disease)    Sees Dr Hollie Salk   Coronary artery disease    Depression    Diabetes ([place])    INSULIN DEPENDENT   Diabetic peripheral neuropathy (HCC)    Diabetic retinopathy ([place])    Diverticulitis    GERD (gastroesophageal reflux disease)    History of CVA (cerebrovascular accident)    x 2 no residulal   Hyperlipidemia    Hypertension    Obesity    Renal lesion    S/P TAVR (transcatheter aortic valve replacement) 11/13/2018   s/p TAVR with a 90m [facility] S3U via the TF approach by Dr. ORoxy Mannsand Dr. MAngelena Form  Severe aortic stenosis     Patient Active Problem List   Diagnosis Date Noted   PVD (peripheral vascular disease) with claudication ([facility] 06/04/2020   Cardiovascular renal disease, stage 1-4 or unspecified chronic kidney disease, without heart failure 05/13/2020   SOB (shortness of breath) 05/12/2020   Cerebrovascular accident (CVA) due to embolism of right posterior cerebral artery ([facility] 05/11/2020    CHF (congestive heart failure) ([facility] 05/11/2020   Stable treated proliferative diabetic retinopathy of right eye without macular edema determined by examination associated with type 2 diabetes mellitus ([facility] [place] 03/02/2020   Posterior vitreous detachment of right eye 03/02/2020   Anemia 02/28/2020   Acute on chronic renal failure ([facility] 02/28/2020   Hypokalemia    Diabetic peripheral neuropathy (HCC)    Benign essential HTN    Cerebral infarction due to embolism of right anterior cerebral artery ([facility] 02/19/2020   Ischemic stroke of frontal lobe ([facility] 02/17/2020   Cerebral embolism with cerebral infarction 02/12/2020   Thrombocytopenia ([facility] 02/10/2020   Acute left-sided weakness 02/09/2020   Weakness 02/08/2020   Chronic heart failure with preserved ejection fraction ([facility] [facility] 01/02/2020   Atypical chest pain 05/31/2019   Central retinal vein occlusion, left eye, stable 05/29/2019   Central retinal vein occlusion of left eye 05/28/2019   Proliferative diabetic retinopathy of left eye with macular edema associated with type 2 diabetes mellitus ([facility] 05/28/2019   Acute on chronic combined systolic and diastolic CHF (congestive heart failure) ([facility] 11/13/2018   S/P TAVR (transcatheter aortic valve replacement) 11/13/2018   Hypertension    Hyperlipidemia    Renal lesion    Severe aortic stenosis 10/23/2018   CKD (chronic kidney disease), stage IV ([facility] 12/05/2017   Coronary artery disease of native artery of native heart with stable angina pectoris ([facility] 01/07/2014  S/P PTCA (percutaneous transluminal coronary angioplasty) 01/07/2014   History of ischemic anterior cerebral artery stroke 01/30/2013    Past Surgical History:  Procedure Laterality Date   ABDOMINAL HYSTERECTOMY     AV FISTULA PLACEMENT Left 12/18/2017   Procedure: ARTERIOVENOUS (AV) FISTULA CREATION ARM;  Surgeon: Marty Heck, MD;  Location: [facility] OR;  Service: Vascular;  Laterality: Left;   bilateral cataract surgery      CARDIAC CATHETERIZATION  01/07/2014   DR Einar Gip   COLON RESECTION  02/1999   COLONOSCOPY     COLOSTOMY  02/1999   COLOSTOMY CLOSURE  07/1999   EYE SURGERY Left 2019   FISTULA SUPERFICIALIZATION Left 06/29/2018   Procedure: FISTULA SUPERFICIALIZATION LEFT BRACHIOCEPHALIC;  Surgeon: Marty Heck, MD;  Location: [facility];  Service: Vascular;  Laterality: Left;   LEFT HEART CATHETERIZATION WITH CORONARY ANGIOGRAM N/A 01/07/2014   Procedure: LEFT HEART CATHETERIZATION WITH CORONARY ANGIOGRAM;  Surgeon: Laverda Page, MD;  Location: [facility] CATH LAB;  Service: Cardiovascular;  Laterality: N/A;   LOOP RECORDER INSERTION N/A 02/17/2020   Procedure: LOOP RECORDER INSERTION;  Surgeon: Deboraha Sprang, MD;  Location: [facility] CV LAB;  Service: Cardiovascular;  Laterality: N/A;   middle cerebral artery stent placement Right    OTHER SURGICAL HISTORY     laser surgery   PTCA  01/07/2014   DES to RCA    DR Einar Gip   right knee surgery Right    for infection   RIGHT/LEFT HEART CATH AND CORONARY ANGIOGRAPHY N/A 10/23/2018   Procedure: RIGHT/LEFT HEART CATH AND CORONARY ANGIOGRAPHY;  Surgeon: Nigel Mormon, MD;  Location: [facility] CV LAB;  Service: Cardiovascular;  Laterality: N/A;   TEE WITHOUT CARDIOVERSION N/A 11/13/2018   Procedure: TRANSESOPHAGEAL ECHOCARDIOGRAM (TEE);  Surgeon: Burnell Blanks, MD;  Location: [facility];  Service: Open Heart Surgery;  Laterality: N/A;   TRANSCATHETER AORTIC VALVE REPLACEMENT, TRANSFEMORAL  11/13/2018   TRANSCATHETER AORTIC VALVE REPLACEMENT, TRANSFEMORAL N/A 11/13/2018   Procedure: TRANSCATHETER AORTIC VALVE REPLACEMENT, TRANSFEMORAL;  Surgeon: Burnell Blanks, MD;  Location: [facility];  Service: Open Heart Surgery;  Laterality: N/A;    OB History   No obstetric history on file.      Home Medications    Prior to Admission medications   Medication Sig Start Date End Date Taking? Authorizing Provider  acetaminophen (TYLENOL) 325 MG  tablet Take 650 mg by mouth every 6 (six) hours as needed for moderate pain or headache.     [provider]  allopurinol (ZYLOPRIM) 100 MG tablet Take 50 mg by mouth daily.    [provider]  amLODipine (NORVASC) 10 MG tablet TAKE 1 TABLET BY MOUTH EVERY DAY Patient taking differently: Take 10 mg by mouth daily. 11/11/19   Patwardhan, Reynold Bowen, MD  amoxicillin-clavulanate (AUGMENTIN) 500-125 MG tablet Take 1 tablet (500 mg total) by mouth daily for 10 days. 06/24/20 07/04/20  Dilynn Munroe, Derry Skill, PA-C  aspirin EC 81 MG tablet Take 1 tablet (81 mg total) by mouth daily. Swallow whole. 02/17/20 02/16/21  Riesa Pope, MD  brimonidine (ALPHAGAN) 0.2 % ophthalmic solution Place into the left eye once. 04/19/19   [provider]  calcitRIOL (ROCALTROL) 0.25 MCG capsule Take 0.25 mcg by mouth daily.     [provider]  cetirizine (ZYRTEC) 10 MG tablet Take 10 mg by mouth daily as needed for allergies.    [provider]  doxycycline (VIBRAMYCIN) 100 MG capsule Take 1 capsule (100 mg total) by mouth  2 (two) times daily. 06/24/20   Ariahna Smiddy, Derry Skill, PA-C  Evolocumab (REPATHA SURECLICK) [id number] MG/ML SOAJ Inject 1 mL into the skin every 14 (fourteen) days.    [provider]  Evolocumab (REPATHA SURECLICK) [id number] MG/ML SOAJ Inject 1 mL into the skin every 14 (fourteen) days. 05/26/20   Patwardhan, Reynold Bowen, MD  ezetimibe (ZETIA) 10 MG tablet Take 10 mg by mouth daily. 04/22/20   [provider]  folic acid (FOLVITE) 1 MG tablet Take 1 mg by mouth daily. 12/07/12   [provider]  HUMALOG KWIKPEN 100 UNIT/ML KwikPen Inject 2-8 Units into the skin 3 (three) times daily before meals.  05/28/18   [provider]  hydrALAZINE (APRESOLINE) 50 MG tablet Take 1 tablet (50 mg total) by mouth every 8 (eight) hours. 02/26/20   Love, Ivan Anchors, PA-C  insulin glargine (LANTUS) 100 UNIT/ML injection Inject 0.12 mLs (12 Units total) into the skin at  bedtime. Patient taking differently: Inject 10 Units into the skin at bedtime. 02/26/20   Love, Ivan Anchors, PA-C  isosorbide mononitrate (IMDUR) 120 MG 24 hr tablet Take 1 tablet (120 mg total) by mouth daily. 05/19/20   Cantwell, Celeste C, PA-C  labetalol (NORMODYNE) 300 MG tablet Take 1 tablet (300 mg total) by mouth 2 (two) times daily. 05/15/20   Nita Sells, MD  nitroGLYCERIN (NITROSTAT) 0.4 MG SL tablet PLACE 1 TABLET (0.4 MG TOTAL) UNDER THE TONGUE EVERY 5 MINUTES X 3 DOSES AS NEEDED FOR CHEST PAIN. Patient taking differently: Place 0.4 mg under the tongue every 5 (five) minutes as needed for chest pain. 01/03/19   Miquel Dunn, NP  polyethylene glycol (MIRALAX / GLYCOLAX) 17 g packet Take 17 g by mouth daily. Patient taking differently: Take 17 g by mouth daily as needed for moderate constipation. 02/27/20   Love, Ivan Anchors, PA-C  polyvinyl alcohol (LIQUIFILM TEARS) 1.4 % ophthalmic solution Place 1 drop into both eyes 3 (three) times daily as needed for dry eyes.    [provider]  rosuvastatin (CRESTOR) 40 MG tablet Take 1 tablet (40 mg total) by mouth daily. 02/26/20   Love, Ivan Anchors, PA-C  senna-docusate (SENOKOT-S) 8.6-50 MG tablet Take 2 tablets by mouth 2 (two) times daily. 02/26/20   Love, Ivan Anchors, PA-C  torsemide (DEMADEX) 20 MG tablet Take 2 tablets (40 mg total) by mouth daily. 02/26/20   Love, Ivan Anchors, PA-C  venlafaxine XR (EFFEXOR-XR) 150 MG 24 hr capsule Take 1 capsule (150 mg total) by mouth daily. 02/27/20   Love, Ivan Anchors, PA-C    Family History Family History  Problem Relation Age of Onset   Diabetes Mother    Heart disease Mother    Prostate cancer Father    Hypertension Brother    Prostate cancer Brother     Social History Social History   Tobacco Use   Smoking status: Never   Smokeless tobacco: Never  Vaping Use   Vaping Use: Never used  Substance Use Topics   Alcohol use: No   Drug use: No     Allergies   Patient has no known  allergies.   Review of Systems Review of Systems  Constitutional:  Positive for activity change, appetite change and fatigue. Negative for fever.  Respiratory:  Positive for cough. Negative for shortness of breath.   Cardiovascular:  Negative for chest pain.  Neurological:  Negative for dizziness, light-headedness and headaches.    Physical Exam Triage Vital Signs ED Triage Vitals  Enc Vitals Group     BP 06/25/20 1117 (S) (!) 117/38     Pulse Rate 06/25/20 1113 (!) 54     Resp 06/25/20 1113 (!) 28     Temp 06/25/20 1113 98.6 F (37 C)     Temp src --      SpO2 06/25/20 1113 99 %     Weight --      Height --      Head Circumference --      Peak Flow --      Pain Score 06/25/20 1116 0     Pain Loc --      Pain Edu? --      Excl. in [place]? --    No data found.  Updated Vital Signs BP (S) (!) 117/38 (BP Location: Right Arm)   Pulse (!) 54   Temp 98.6 F (37 C)   Resp (!) 28   SpO2 99%   Visual Acuity Right Eye Distance:   Left Eye Distance:   Bilateral Distance:    Right Eye Near:   Left Eye Near:    Bilateral Near:     Physical Exam Vitals reviewed.  Constitutional:      General: She is awake. She is not in acute distress.    Appearance: Normal appearance. She is not ill-appearing.     Comments: Very pleasant female appears stated age in no acute distress sitting comfortably in triage room  HENT:     Head: Normocephalic and atraumatic.  Cardiovascular:     Rate and Rhythm: Normal rate and regular rhythm.     Heart sounds: Normal heart sounds, S1 normal and S2 normal. No murmur heard. Pulmonary:     Effort: Pulmonary effort is normal.     Breath sounds: Rhonchi present. No wheezing or rales.     Comments: Rhonchi noted throughout lung fields. Abdominal:     Palpations: Abdomen is soft.     Tenderness: There is no abdominal tenderness.  Psychiatric:        Behavior: Behavior is cooperative.     UC Treatments / Results  Labs (all labs ordered are  listed, but only abnormal results are displayed) Labs Reviewed - No data to display  EKG   Radiology DG Chest 2 View  Result Date: 06/24/2020 CLINICAL DATA:  Cough. EXAM: CHEST - 2 VIEW COMPARISON:  May 12, 2020 FINDINGS: Mild patchy infiltrate is seen within the posterior aspect of the right upper lobe. Mild diffusely increased interstitial lung markings are also noted. There is no evidence of a pleural effusion or pneumothorax. There is stable cardiomegaly. An artificial aortic valve is seen. A radiopaque loop recorder device is noted. The visualized skeletal structures are unremarkable. IMPRESSION: Mild right upper lobe infiltrate. Electronically Signed   By: Virgina Norfolk [name]   On: 06/24/2020 19:03    Procedures Procedures (including critical care time)  Medications Ordered in UC Medications - No data to display  Initial Impression / Assessment and Plan / UC Course  I have reviewed the triage vital signs and the nursing notes.  Pertinent labs & imaging results that were available during my care of the patient were reviewed by me and considered in my medical decision making (see chart for details).      Patient was encouraged to go to the emergency room yesterday to consider admission based on curb 65 criteria.  She declined that and wanted to try outpatient antibiotics which she was prescribed last night.  She  reports persistent symptoms and today vital signs are worse.  EKG was obtained which showed sinus bradycardia with first-degree AV block and no acute changes with no significant change from 05/20/2018 tracing.  Patient was agreeable to this and will go directly to [facility] emergency room following visit today.  She requested private transport and will have her husband take her immediately to ER.  Vital signs are stable at the time of discharge and patient was safe for private transport.  Final Clinical Impressions(s) / UC Diagnoses   Final diagnoses:  Pneumonia of right  upper lobe due to Chlamydia species  Shortness of breath   Discharge Instructions   None    ED Prescriptions   None    PDMP not reviewed this encounter.   Terrilee Croak, PA-C 06/25/20 1220

## 2020-06-25 NOTE — ED Provider Notes (Signed)
Emergency Medicine Provider Triage Evaluation Note  [name] , a 80 y.o. female  was evaluated in triage.  Pt complains of SOB. Began yesterday.  Associated cough productive of clear sputum.  Does not use tobacco.  No history of COPD.  Was seen by urgent care yesterday and today. Sent here for further evaluation.  COVID yesterday was negative.  X-ray yesterday showed right upper lobe infiltrate.  Mild associated tightness.  No Chest pain currently Review of Systems  Positive: SOB, Cough Negative: LE edema, abd pain, back pain  Physical Exam  BP (!) 117/44 (BP Location: Left Arm)   Pulse (!) 52   Temp 98.2 F (36.8 C) (Oral)   Resp 18   SpO2 95%  Gen:   Awake, no distress   Resp:  Normal effort, decreased on right MSK:   Moves extremities without difficulty. No LE edema Other:    Medical Decision Making  Medically screening exam initiated at 12:23 PM.  Appropriate orders placed.  [name] was informed that the remainder of the evaluation will be completed by another provider, this initial triage assessment does not replace that evaluation, and the importance of remaining in the ED until their evaluation is complete.  Pneumonia, SOB  No acute distress.  Plan on labs, imaging   Nettie Elm, PA-C 06/25/20 1227    Valarie Merino, MD 06/28/20 1120

## 2020-06-25 NOTE — Hospital Course (Signed)
Dry cough started yesterday. She denies chest pain. She endorses SOB. She went to urgent care yestday and started on antibiotic. She did not improve and followed instruction to come to ED.   She does take torseimid

## 2020-06-25 NOTE — H&P (Addendum)
Date: 06/25/2020               Patient Name:  [name] MRN: [id number]  DOB: 06/06/1940 Age / Sex: 80 y.o., female   PCP: Audley Hose, MD              Medical Service: Internal Medicine Teaching Service              Attending Physician: Dr. Sid Falcon, MD         First Contact: Garen Grams, MS 4 Pager: [phone]  Second Contact: Dr. Court Joy Pager: [phone]            After Hours (After 5p/  First Contact Pager: [phone]  weekends / holidays): Second Contact Pager: [phone]   Chief Complaint: shortness of breath   History of Present Illness:  [name] is an 80yo F with a PMH of CKD IV/V, HFpEF, CAD s/p DES PCI, aortic stenosis s/p TAVR, CVA x2, insulin-dependent DM, HTN, HLD, and RA who presents with shortness of breath. She was in her usual state of health until yesterday when she noticed increased dyspnea at rest and with exertion. She also developed a dry cough, body aches, and chills (no recorded fevers). She went to Urgent Care who prescribed Augmentin and Doxycycline with instructions to present to hospital if she is not better today. Given increased SOB, she elected to come to the ED. She is up to date on pneumonia vaccines. She has no smoking history. ROS otherwise negative.   She was recently admitted for HF exacerbation in April. Since then, she reports some increased LE edema that improves with Torsemide. She reports daily compliance with Torsemide (and all of her meds). She follows with Dr. Hollie Salk for CKD. She has a fistula since 2020 for anticipated HD at some point (baseline GFR 13-18).  She lives here in [place] with her daughter. Her brother was at bedside with her today. She sees Dr. Maia Petties (PCP).   Meds: Imdur Labetalol Torsemide '40mg'$  Amlodipine Hydralazine ASA Calcitriol  Allopurinol Brimonidine  Cetirizine Ezetimibe (Zetia) Folic acid Humalog SSI Lantus 9U Crestor '40mg'$  Effexor  Current Facility-Administered Medications for the 06/25/20  encounter [facility] Encounter)  Medication   Evolocumab SOAJ 140 mg   No outpatient medications have been marked as taking for the 06/25/20 encounter [facility] Encounter).   Allergies: Allergies as of 06/25/2020   (No Known Allergies)   Past Medical History:  Diagnosis Date   Arthritis    Cerebrovascular disease    CKD (chronic kidney disease)    Sees Dr Hollie Salk   Coronary artery disease    Depression    Diabetes ([place])    INSULIN DEPENDENT   Diabetic peripheral neuropathy (HCC)    Diabetic retinopathy ([place])    Diverticulitis    GERD (gastroesophageal reflux disease)    History of CVA (cerebrovascular accident)    x 2 no residulal   Hyperlipidemia    Hypertension    Obesity    Renal lesion    S/P TAVR (transcatheter aortic valve replacement) 11/13/2018   s/p TAVR with a 19m [facility] S3U via the TF approach by Dr. ORoxy Mannsand Dr. MAngelena Form  Severe aortic stenosis    Family History: Mother: DM, CAD Father: prostate cancer Brother: HTN, CAD  Social History: PCP: Dr. BMaia Petties Smoking history: None Drug use: None Home/support: Lives with daughter   Review of Systems: A complete ROS was negative except as per HPI.  Physical Exam: Blood pressure (!) 102/48,  pulse (!) 54, temperature 98.2 F (36.8 C), temperature source Oral, resp. rate 17, SpO2 99 %. Physical Exam Vitals and nursing note reviewed.  Constitutional:      Comments: Elderly woman resting in bed comfortably, NAD, conversational  Cardiovascular:     Rate and Rhythm: Regular rhythm. Bradycardia present.     Comments: Holosystolic murmur with mechanical sound Elevated JVP Pulmonary:     Effort: Pulmonary effort is normal.     Breath sounds: Normal breath sounds.     Comments: No crackles noted Chest:     Chest wall: No tenderness.  Abdominal:     Palpations: Abdomen is soft.     Tenderness: There is no abdominal tenderness.  Musculoskeletal:     Right lower leg: No edema.     Left lower leg: No edema.   Skin:    General: Skin is warm and dry.     Comments: Fistula on left upper arm with palpable thrill   EKG: personally reviewed my interpretation is sinus brady with 1st degree AV block.   CXR: personally reviewed my interpretation is RUL pneumonia.   Assessment & Plan by Problem: Active Problems:   Pneumonia  [name] is an 79yo F with a PMH of CKD IV/V, HFpEF, CAD s/p DES PCI, aortic stenosis s/p TAVR, CVA x2, insulin-dependent DM, HTN, HLD, and RA who presents with shortness of breath.   She had a CXR yesterday with an RUL infiltrate. Repeat CXR in ED demonstrated stable RUL infiltrate. WBC 19.1 with 81% neutrophils. Influenza and COVID negative. As such, her SOB, cough, body aches, chills, RUL infiltrate, and leukocytosis with left shift are consistent with pneumonia. Given she lives at home with no risk factors, we will treat as CAP with ceftriaxone and azithromycin. CURB-65 score is +2.   In addition, her BNP is elevated to 1345 (up from 300 one month ago) and troponin is 137. While she was recently admitted for HF exacerbation, no evidence of profuse hypervolemia on exam besides mildly elevated JVP. Subjectively, her swelling has been way worse before. No chest pain or EKG changes to suggest an ischemic event.   #Community acquired pneumonia CXR, chills, cough, leukocytosis consistent with CAP. CURB-65 score of 2.  - Start Ceftriaxone and Azithromycin (day 1/5) - [place] for sat <94% - CXR in 3-4 weeks  - BMP, CBC, Mg, Phos tomorrow  - urine pneumonia and urine legionella.   #Troponinemia   Elevated to 137 in the ED. No chest pain or ischemic EKG changes.  - Will trend  #Elevated BNP #HFpEF BNP elevated to 1345 in ED. Mild JVD on exam but no other evidence of hypervolemia. She takes Torsemide '40mg'$  daily at home.  - Continue to monitor volume status  - One dose Lasix '40mg'$  tonight   #AKI on CKD IV/V Fistula in place for potential HD. No HD yet. Follows with Dr. Hollie Salk.  Baseline GFR 13-18, currently 9 in ED here. Cr 3.55 in April on discharge from hospital for HF exacerbation. - Cr 4.76, GFR 9 - One dose Lasix '40mg'$  tonight  - Cont Calcitriol and folic acid  - Avoid nephrotoxic medications  - Renal diet  #Insulin-dependent type 2 DM #Diabetic retinopathy #Pseudohyponatremia Controlled at home with insulin - SSI and Lantus. Glucose 275 on admission with Na of 132 that corrects to 135-136 when accounting for glucose.  - Cont SSI and Lantus 10U - Cont brimonidine and Liquifilm eye drops  #Hypokalemia 3.0 on admission. - Given 83mq - Replete  as necessary  #Hypoalbuminemia with associated pseudohypocalcemia  Low albumin of 3.0 likely related to acute illness, CKD, age, HF, etc. Low calcium (8.7) corrects to ~9.5 when accounting for albumin. - BMP tomorrow  #CAD #Aortic stenosis s/p TAVR #CVA #HLD History of CAD with DES PCI. Two prior CVAs with no residual deficits. Bradycardic to 50s (around baseline) with 1st degree AV block on EKG. - Cont ASA - Cont Crestor  #HTN On Imdur, Labetalol, Torsemide, Amlodipine, and Hydralazine at home. - Holding home meds in setting of low BP  #Depression - Cont home Effexor   Dispo: Admit patient to Observation with expected length of stay less than 2 midnights.  Signed: Malachi Carl, Medical Student 06/25/2020, 4:52 PM    Attestation for Student Documentation:  Hospital day #1 for ATHALEEN GALLEN 80 year old person with CKD stage IV/V, aortic stenosis status post TAVR, CAD status post drug-eluting stent, CVAs who presents for evaluation of shortness of breath.  Started on outpatient antibiotics and show no improvement in 1 day so come to the emergency department.  She has a leukocytosis with left shift and right upper lobe infiltrate on chest x-ray.    Patient does appear grossly volume overloaded on exam.  She does have some mild JVP and elevated BNP above apparent baseline. She has a left AV fistula which  was placed in preparation for possible dialysis in the future. She follows with Dr. Hollie Salk at [facility]. Will treat for community-acquired pneumonia. Avoid nephrotoxic medications. Holding home BP meds, in setting of infection while patient is normotensive. Can restart tomorrow if patient stable or if patient becomes hypertensive overnight. Blood cultures not collected as likely not helpful after two days of antibiotics.   I personally was present and performed or re-performed the history, physical exam and medical decision-making activities of this service and have verified that the service and findings are accurately documented in the student's note.  Madalyn Rob, MD 06/25/2020, 6:48 PM

## 2020-06-25 NOTE — ED Provider Notes (Signed)
[facility] EMERGENCY DEPARTMENT Provider Note   CSN: [id number] Arrival date & time: 06/25/20  1140     History Chief Complaint  Patient presents with   Shortness of Breath    [name] is a 80 y.o. female.  The history is provided by the patient and medical records. No language interpreter was used.  Shortness of Breath  80 year old female significant history of CAD, CKD, aortic stenosis s/p TAVR, diabetes, prior stroke who presents for evaluation of shortness of breath.  Patient states that she developed shortness of breath, body aches which all started since yesterday.  Shortness of breath more noticeable at nighttime.  She also endorsed cough, and mild sore throat.  She denies any fever or chills, no congestion chest pain abdominal pain dysuria.  She went to urgent care for her symptoms yesterday.  States she was diagnosed with pneumonia and was prescribed 2 different antibiotics.  She was told to come to the ER if her symptoms worsen.  Patient mention her cough did improve however she still endorse shortness of breath prompting this ER visit.  She is fully vaccinated for COVID-19.  She mentioned that her COVID test yesterday was normal.  And she does notice some increased fluid retention to her lower extremities.  Ms. that she has history of CHF.  Past Medical History:  Diagnosis Date   Arthritis    Cerebrovascular disease    CKD (chronic kidney disease)    Sees Dr Hollie Salk   Coronary artery disease    Depression    Diabetes ([address])    INSULIN DEPENDENT   Diabetic peripheral neuropathy (HCC)    Diabetic retinopathy ([place])    Diverticulitis    GERD (gastroesophageal reflux disease)    History of CVA (cerebrovascular accident)    x 2 no residulal   Hyperlipidemia    Hypertension    Obesity    Renal lesion    S/P TAVR (transcatheter aortic valve replacement) 11/13/2018   s/p TAVR with a 26m Edwards S3U via the TF approach by Dr. ORoxy Mannsand Dr. MAngelena Form   Severe aortic stenosis     Patient Active Problem List   Diagnosis Date Noted   PVD (peripheral vascular disease) with claudication ([place] 06/04/2020   Cardiovascular renal disease, stage 1-4 or unspecified chronic kidney disease, without heart failure 05/13/2020   SOB (shortness of breath) 05/12/2020   Cerebrovascular accident (CVA) due to embolism of right posterior cerebral artery ([place] 05/11/2020   CHF (congestive heart failure) ([place] 05/11/2020   Stable treated proliferative diabetic retinopathy of right eye without macular edema determined by examination associated with type 2 diabetes mellitus ([place] 03/02/2020   Posterior vitreous detachment of right eye 03/02/2020   Anemia 02/28/2020   Acute on chronic renal failure ([place] 02/28/2020   Hypokalemia    Diabetic peripheral neuropathy (HCC)    Benign essential HTN    Cerebral infarction due to embolism of right anterior cerebral artery ([place] 02/19/2020   Ischemic stroke of frontal lobe ([place] 02/17/2020   Cerebral embolism with cerebral infarction 02/12/2020   Thrombocytopenia ([place] 02/10/2020   Acute left-sided weakness 02/09/2020   Weakness 02/08/2020   Chronic heart failure with preserved ejection fraction ([place] 01/02/2020   Atypical chest pain 05/31/2019   Central retinal vein occlusion, left eye, stable 05/29/2019   Central retinal vein occlusion of left eye 05/28/2019   Proliferative diabetic retinopathy of left eye with macular edema associated with type 2 diabetes mellitus ([place] 05/28/2019  Acute on chronic combined systolic and diastolic CHF (congestive heart failure) ([place]) 11/13/2018   S/P TAVR (transcatheter aortic valve replacement) 11/13/2018   Hypertension    Hyperlipidemia    Renal lesion    Severe aortic stenosis 10/23/2018   CKD (chronic kidney disease), stage IV (HCC) 12/05/2017   Coronary artery disease of native artery of native heart with stable angina pectoris ([place]) 01/07/2014   S/P PTCA (percutaneous  transluminal coronary angioplasty) 01/07/2014   History of ischemic anterior cerebral artery stroke 01/30/2013    Past Surgical History:  Procedure Laterality Date   ABDOMINAL HYSTERECTOMY     AV FISTULA PLACEMENT Left 12/18/2017   Procedure: ARTERIOVENOUS (AV) FISTULA CREATION ARM;  Surgeon: Marty Heck, MD;  Location: [place];  Service: Vascular;  Laterality: Left;   bilateral cataract surgery     CARDIAC CATHETERIZATION  01/07/2014   DR Einar Gip   COLON RESECTION  02/1999   COLONOSCOPY     COLOSTOMY  02/1999   COLOSTOMY CLOSURE  07/1999   EYE SURGERY Left 2019   FISTULA SUPERFICIALIZATION Left 06/29/2018   Procedure: FISTULA SUPERFICIALIZATION LEFT BRACHIOCEPHALIC;  Surgeon: Marty Heck, MD;  Location: [place];  Service: Vascular;  Laterality: Left;   LEFT HEART CATHETERIZATION WITH CORONARY ANGIOGRAM N/A 01/07/2014   Procedure: LEFT HEART CATHETERIZATION WITH CORONARY ANGIOGRAM;  Surgeon: Laverda Page, MD;  [facility] LAB;  Service: Cardiovascular;  Laterality: N/A;   LOOP RECORDER INSERTION N/A 02/17/2020   Procedure: LOOP RECORDER INSERTION;  Surgeon: Deboraha Sprang, MD;  Location: [place] CV LAB;  Service: Cardiovascular;  Laterality: N/A;   middle cerebral artery stent placement Right    OTHER SURGICAL HISTORY     laser surgery   PTCA  01/07/2014   DES to RCA    DR Einar Gip   right knee surgery Right    for infection   RIGHT/LEFT HEART CATH AND CORONARY ANGIOGRAPHY N/A 10/23/2018   Procedure: RIGHT/LEFT HEART CATH AND CORONARY ANGIOGRAPHY;  Surgeon: Nigel Mormon, MD;  Location: [place] CV LAB;  Service: Cardiovascular;  Laterality: N/A;   TEE WITHOUT CARDIOVERSION N/A 11/13/2018   Procedure: TRANSESOPHAGEAL ECHOCARDIOGRAM (TEE);  Surgeon: Burnell Blanks, MD;  Location: [place];  Service: Open Heart Surgery;  Laterality: N/A;   TRANSCATHETER AORTIC VALVE REPLACEMENT, TRANSFEMORAL  11/13/2018   TRANSCATHETER AORTIC VALVE REPLACEMENT,  TRANSFEMORAL N/A 11/13/2018   Procedure: TRANSCATHETER AORTIC VALVE REPLACEMENT, TRANSFEMORAL;  Surgeon: Burnell Blanks, MD;  Location: [place];  Service: Open Heart Surgery;  Laterality: N/A;     OB History   No obstetric history on file.     Family History  Problem Relation Age of Onset   Diabetes Mother    Heart disease Mother    Prostate cancer Father    Hypertension Brother    Prostate cancer Brother     Social History   Tobacco Use   Smoking status: Never   Smokeless tobacco: Never  Vaping Use   Vaping Use: Never used  Substance Use Topics   Alcohol use: No   Drug use: No    Home Medications Prior to Admission medications   Medication Sig Start Date End Date Taking? Authorizing Provider  acetaminophen (TYLENOL) 325 MG tablet Take 650 mg by mouth every 6 (six) hours as needed for moderate pain or headache.     [provider]  allopurinol (ZYLOPRIM) 100 MG tablet Take 50 mg by mouth daily.    [provider]  amLODipine ([place])  10 MG tablet TAKE 1 TABLET BY MOUTH EVERY DAY Patient taking differently: Take 10 mg by mouth daily. 11/11/19   Patwardhan, Reynold Bowen, MD  amoxicillin-clavulanate (AUGMENTIN) 500-125 MG tablet Take 1 tablet (500 mg total) by mouth daily for 10 days. 06/24/20 07/04/20  Raspet, Derry Skill, PA-C  aspirin EC 81 MG tablet Take 1 tablet (81 mg total) by mouth daily. Swallow whole. 02/17/20 02/16/21  Riesa Pope, MD  brimonidine (ALPHAGAN) 0.2 % ophthalmic solution Place into the left eye once. 04/19/19   [provider]  calcitRIOL (ROCALTROL) 0.25 MCG capsule Take 0.25 mcg by mouth daily.     [provider]  cetirizine (ZYRTEC) 10 MG tablet Take 10 mg by mouth daily as needed for allergies.    [provider]  doxycycline (VIBRAMYCIN) 100 MG capsule Take 1 capsule (100 mg total) by mouth 2 (two) times daily. 06/24/20   Raspet, Derry Skill, PA-C  Evolocumab (REPATHA SURECLICK) [id number] MG/ML SOAJ Inject 1 mL  into the skin every 14 (fourteen) days.    [provider]  Evolocumab (REPATHA SURECLICK) [id number] MG/ML SOAJ Inject 1 mL into the skin every 14 (fourteen) days. 05/26/20   Patwardhan, Reynold Bowen, MD  ezetimibe (ZETIA) 10 MG tablet Take 10 mg by mouth daily. 04/22/20   [provider]  folic acid (FOLVITE) 1 MG tablet Take 1 mg by mouth daily. 12/07/12   [provider]  HUMALOG KWIKPEN 100 UNIT/ML KwikPen Inject 2-8 Units into the skin 3 (three) times daily before meals.  05/28/18   [provider]  hydrALAZINE (APRESOLINE) 50 MG tablet Take 1 tablet (50 mg total) by mouth every 8 (eight) hours. 02/26/20   Love, Ivan Anchors, PA-C  insulin glargine (LANTUS) 100 UNIT/ML injection Inject 0.12 mLs (12 Units total) into the skin at bedtime. Patient taking differently: Inject 10 Units into the skin at bedtime. 02/26/20   Love, Ivan Anchors, PA-C  isosorbide mononitrate (IMDUR) 120 MG 24 hr tablet Take 1 tablet (120 mg total) by mouth daily. 05/19/20   Cantwell, Celeste C, PA-C  labetalol (NORMODYNE) 300 MG tablet Take 1 tablet (300 mg total) by mouth 2 (two) times daily. 05/15/20   Nita Sells, MD  nitroGLYCERIN (NITROSTAT) 0.4 MG SL tablet PLACE 1 TABLET (0.4 MG TOTAL) UNDER THE TONGUE EVERY 5 MINUTES X 3 DOSES AS NEEDED FOR CHEST PAIN. Patient taking differently: Place 0.4 mg under the tongue every 5 (five) minutes as needed for chest pain. 01/03/19   Miquel Dunn, NP  polyethylene glycol (MIRALAX / GLYCOLAX) 17 g packet Take 17 g by mouth daily. Patient taking differently: Take 17 g by mouth daily as needed for moderate constipation. 02/27/20   Love, Ivan Anchors, PA-C  polyvinyl alcohol (LIQUIFILM TEARS) 1.4 % ophthalmic solution Place 1 drop into both eyes 3 (three) times daily as needed for dry eyes.    [provider]  rosuvastatin (CRESTOR) 40 MG tablet Take 1 tablet (40 mg total) by mouth daily. 02/26/20   Love, Ivan Anchors, PA-C  senna-docusate (SENOKOT-S) 8.6-50 MG  tablet Take 2 tablets by mouth 2 (two) times daily. 02/26/20   Love, Ivan Anchors, PA-C  torsemide (DEMADEX) 20 MG tablet Take 2 tablets (40 mg total) by mouth daily. 02/26/20   Love, Ivan Anchors, PA-C  venlafaxine XR (EFFEXOR-XR) 150 MG 24 hr capsule Take 1 capsule (150 mg total) by mouth daily. 02/27/20   Bary Leriche, PA-C    Allergies    Patient has no known  allergies.  Review of Systems   Review of Systems  Respiratory:  Positive for shortness of breath.   All other systems reviewed and are negative.  Physical Exam Updated Vital Signs BP (!) 111/40 (BP Location: Right Arm)   Pulse (!) 52   Temp 98.2 F (36.8 C) (Oral)   Resp 17   SpO2 98%   Physical Exam Vitals and nursing note reviewed.  Constitutional:      General: She is not in acute distress.    Appearance: She is well-developed.  HENT:     Head: Atraumatic.  Eyes:     Conjunctiva/sclera: Conjunctivae normal.  Cardiovascular:     Rate and Rhythm: Normal rate and regular rhythm.     Heart sounds: Murmur heard.  Pulmonary:     Effort: Pulmonary effort is normal.     Breath sounds: Normal breath sounds. No decreased breath sounds, wheezing, rhonchi or rales.  Chest:     Chest wall: No tenderness.  Abdominal:     Palpations: Abdomen is soft.  Musculoskeletal:     Cervical back: Neck supple.     Right lower leg: Edema present.     Left lower leg: Edema present.     Comments: Trace edema to bilateral lower extremities  Skin:    Findings: No rash.  Neurological:     Mental Status: She is alert and oriented to person, place, and time.  Psychiatric:        Mood and Affect: Mood normal.    ED Results / Procedures / Treatments   Labs (all labs ordered are listed, but only abnormal results are displayed) Labs Reviewed  CBC WITH DIFFERENTIAL/PLATELET - Abnormal; Notable for the following components:      Result Value   WBC 19.1 (*)    RBC 3.25 (*)    Hemoglobin 9.3 (*)    HCT 28.6 (*)    Neutro Abs 15.5 (*)     Monocytes Absolute 1.7 (*)    Abs Immature Granulocytes 0.13 (*)    All other components within normal limits  COMPREHENSIVE METABOLIC PANEL - Abnormal; Notable for the following components:   Sodium 132 (*)    Potassium 3.0 (*)    Chloride 96 (*)    Glucose, Bld 275 (*)    BUN 89 (*)    Creatinine, Ser 4.76 (*)    Calcium 8.7 (*)    Total Protein 6.2 (*)    Albumin 3.0 (*)    GFR, Estimated 9 (*)    All other components within normal limits  BRAIN NATRIURETIC PEPTIDE - Abnormal; Notable for the following components:   B Natriuretic Peptide 1,345.1 (*)    All other components within normal limits  TROPONIN I (HIGH SENSITIVITY) - Abnormal; Notable for the following components:   Troponin I (High Sensitivity) 137 (*)    All other components within normal limits  TROPONIN I (HIGH SENSITIVITY)    EKG None ED ECG REPORT   Date: 06/25/2020  Rate: 54  Rhythm: sinus bradycardia  QRS Axis: normal  Intervals: QT prolonged  ST/T Wave abnormalities: nonspecific T wave changes  Conduction Disutrbances:first-degree A-V block   Narrative Interpretation:   Old EKG Reviewed: changes noted.  T wave inversion in anterior leads  I have personally reviewed the EKG tracing and agree with the computerized printout as noted.   Radiology DG Chest 2 View  Result Date: 06/25/2020 CLINICAL DATA:  Shortness of breath, cough. EXAM: CHEST - 2 VIEW COMPARISON:  June 24, 2020. FINDINGS: Stable cardiomediastinal silhouette. Stable right upper lobe airspace opacity is noted concerning for possible pneumonia. Left lung is clear. Status post transcatheter aortic valve repair. Left lung is clear. Bony thorax is unremarkable. IMPRESSION: Stable right upper lobe airspace opacity is noted most consistent with pneumonia. Followup PA and lateral chest X-ray is recommended in 3-4 weeks following trial of antibiotic therapy to ensure resolution and exclude underlying malignancy. Electronically Signed   By: Marijo Conception [name]   On: 06/25/2020 13:24   DG Chest 2 View  Result Date: 06/24/2020 CLINICAL DATA:  Cough. EXAM: CHEST - 2 VIEW COMPARISON:  May 12, 2020 FINDINGS: Mild patchy infiltrate is seen within the posterior aspect of the right upper lobe. Mild diffusely increased interstitial lung markings are also noted. There is no evidence of a pleural effusion or pneumothorax. There is stable cardiomegaly. An artificial aortic valve is seen. A radiopaque loop recorder device is noted. The visualized skeletal structures are unremarkable. IMPRESSION: Mild right upper lobe infiltrate. Electronically Signed   By: Virgina Norfolk [name]   On: 06/24/2020 19:03    Procedures .Critical Care  Date/Time: 06/25/2020 4:17 PM Performed by: Domenic Moras, PA-C Authorized by: Domenic Moras, PA-C   Critical care provider statement:    Critical care time (minutes):  35   Critical care was time spent personally by me on the following activities:  Discussions with consultants, evaluation of patient's response to treatment, examination of patient, ordering and performing treatments and interventions, ordering and review of laboratory studies, ordering and review of radiographic studies, pulse oximetry, re-evaluation of patient's condition, obtaining history from patient or surrogate and review of old charts BP (!) 111/40 (BP Location: Right Arm)   Pulse (!) 52   Temp 98.2 F (36.8 C) (Oral)   Resp 17   SpO2 98%    Medications Ordered in ED Medications  cefTRIAXone (ROCEPHIN) 1 g in sodium chloride 0.9 % 100 mL IVPB (has no administration in time range)  azithromycin (ZITHROMAX) tablet 500 mg (500 mg Oral Given 06/25/20 1609)  potassium chloride SA (KLOR-CON) CR tablet 40 mEq (40 mEq Oral Given 06/25/20 1609)    ED Course  I have reviewed the triage vital signs and the nursing notes.  Pertinent labs & imaging results that were available during my care of the patient were reviewed by me and considered in my medical decision  making (see chart for details).    MDM Rules/Calculators/A&P                          BP (!) 111/40 (BP Location: Right Arm)   Pulse (!) 52   Temp 98.2 F (36.8 C) (Oral)   Resp 17   SpO2 98%   Final Clinical Impression(s) / ED Diagnoses Final diagnoses:  Community acquired pneumonia of right upper lobe of lung  Acute on chronic congestive heart failure, unspecified heart failure type (HCC)  Elevated troponin    Rx / DC Orders ED Discharge Orders     None      3:29 PM And this is an elderly female who has complaints of shortness of breath as well as body aches since yesterday.  Chest x-ray today demonstrate right upper lobe airspace opacity consistent with pneumonia.  Will treat with Rocephin and Zithromax.  Other abnormal labs including elevated BNP of 1345.  Patient does have a some mild fluid retention to her lower extremities bilaterally but not overtly fluid overloaded.  Mildly elevated troponin of 137 however patient denies any active chest pain.  Patient has poor renal function with creatinine of 4.76, near baseline.  Hypokalemia with potassium of 3.0, supplementation given.  Pt's CURB-65 score is 2, which puts her at moderate risk.    3:49 PM Appreciate consultation to cardiology and I spoke with cardmaster Trish who said admission team can reach out for cardiology if needed otherwise cycle troponin  4:17 PM Appreciate consultation from Internal Medicine resident who agrees to see and will admit pt.     Domenic Moras, PA-C 06/25/20 1620    Lucrezia Starch, MD 06/28/20 [phone]

## 2020-06-25 NOTE — ED Triage Notes (Signed)
Patient sent to ED from urgent care for further evaluation of shortness of breath. Patient complains of shortness of breath that started yesterday. Patient alert, oriented, and in no apparent distress at this time. SpO2 95% on room air.

## 2020-06-25 NOTE — ED Notes (Signed)
Patient is being discharged from the Urgent Care and sent to the Emergency Department via [place] . Per Verna Czech PA, patient is in need of higher level of care due to shortness of breath, bradycardia, hypotension. Patient is aware and verbalizes understanding of plan of care.  Vitals:   06/25/20 1113 06/25/20 1117  BP:  (S) (!) 117/38  Pulse: (!) 54   Resp: (!) 28   Temp: 98.6 F (37 C)   SpO2: 99%

## 2020-06-26 ENCOUNTER — Encounter (HOSPITAL_COMMUNITY): Payer: Self-pay | Admitting: Internal Medicine

## 2020-06-26 DIAGNOSIS — I5032 Chronic diastolic (congestive) heart failure: Secondary | ICD-10-CM | POA: Diagnosis present

## 2020-06-26 DIAGNOSIS — N179 Acute kidney failure, unspecified: Secondary | ICD-10-CM | POA: Diagnosis present

## 2020-06-26 DIAGNOSIS — N184 Chronic kidney disease, stage 4 (severe): Secondary | ICD-10-CM | POA: Diagnosis present

## 2020-06-26 DIAGNOSIS — E785 Hyperlipidemia, unspecified: Secondary | ICD-10-CM | POA: Diagnosis present

## 2020-06-26 DIAGNOSIS — R7401 Elevation of levels of liver transaminase levels: Secondary | ICD-10-CM | POA: Diagnosis not present

## 2020-06-26 DIAGNOSIS — I130 Hypertensive heart and chronic kidney disease with heart failure and stage 1 through stage 4 chronic kidney disease, or unspecified chronic kidney disease: Secondary | ICD-10-CM | POA: Diagnosis present

## 2020-06-26 DIAGNOSIS — R001 Bradycardia, unspecified: Secondary | ICD-10-CM | POA: Diagnosis present

## 2020-06-26 DIAGNOSIS — J189 Pneumonia, unspecified organism: Secondary | ICD-10-CM | POA: Diagnosis present

## 2020-06-26 DIAGNOSIS — Z20822 Contact with and (suspected) exposure to covid-19: Secondary | ICD-10-CM | POA: Diagnosis present

## 2020-06-26 DIAGNOSIS — Z952 Presence of prosthetic heart valve: Secondary | ICD-10-CM | POA: Diagnosis not present

## 2020-06-26 DIAGNOSIS — J181 Lobar pneumonia, unspecified organism: Secondary | ICD-10-CM | POA: Diagnosis present

## 2020-06-26 DIAGNOSIS — D631 Anemia in chronic kidney disease: Secondary | ICD-10-CM | POA: Diagnosis present

## 2020-06-26 DIAGNOSIS — F32A Depression, unspecified: Secondary | ICD-10-CM | POA: Diagnosis present

## 2020-06-26 DIAGNOSIS — E876 Hypokalemia: Secondary | ICD-10-CM | POA: Diagnosis present

## 2020-06-26 DIAGNOSIS — E1142 Type 2 diabetes mellitus with diabetic polyneuropathy: Secondary | ICD-10-CM | POA: Diagnosis present

## 2020-06-26 DIAGNOSIS — I2510 Atherosclerotic heart disease of native coronary artery without angina pectoris: Secondary | ICD-10-CM | POA: Diagnosis present

## 2020-06-26 DIAGNOSIS — E8809 Other disorders of plasma-protein metabolism, not elsewhere classified: Secondary | ICD-10-CM | POA: Diagnosis present

## 2020-06-26 DIAGNOSIS — Z833 Family history of diabetes mellitus: Secondary | ICD-10-CM | POA: Diagnosis not present

## 2020-06-26 DIAGNOSIS — Z955 Presence of coronary angioplasty implant and graft: Secondary | ICD-10-CM | POA: Diagnosis not present

## 2020-06-26 DIAGNOSIS — R031 Nonspecific low blood-pressure reading: Secondary | ICD-10-CM | POA: Diagnosis present

## 2020-06-26 DIAGNOSIS — E11319 Type 2 diabetes mellitus with unspecified diabetic retinopathy without macular edema: Secondary | ICD-10-CM | POA: Diagnosis present

## 2020-06-26 DIAGNOSIS — I440 Atrioventricular block, first degree: Secondary | ICD-10-CM | POA: Diagnosis present

## 2020-06-26 DIAGNOSIS — M069 Rheumatoid arthritis, unspecified: Secondary | ICD-10-CM | POA: Diagnosis present

## 2020-06-26 DIAGNOSIS — Z8673 Personal history of transient ischemic attack (TIA), and cerebral infarction without residual deficits: Secondary | ICD-10-CM | POA: Diagnosis not present

## 2020-06-26 DIAGNOSIS — E1122 Type 2 diabetes mellitus with diabetic chronic kidney disease: Secondary | ICD-10-CM | POA: Diagnosis present

## 2020-06-26 LAB — GLUCOSE, CAPILLARY
Glucose-Capillary: 158 mg/dL — ABNORMAL HIGH (ref 70–99)
Glucose-Capillary: 182 mg/dL — ABNORMAL HIGH (ref 70–99)
Glucose-Capillary: 215 mg/dL — ABNORMAL HIGH (ref 70–99)
Glucose-Capillary: 150 mg/dL — ABNORMAL HIGH (ref 70–99)

## 2020-06-26 LAB — CBC WITH DIFFERENTIAL/PLATELET
Abs Immature Granulocytes: 0.07 10*3/uL (ref 0.00–0.07)
Basophils Absolute: 0.1 10*3/uL (ref 0.0–0.1)
Basophils Relative: 0 %
Eosinophils Absolute: 0.2 10*3/uL (ref 0.0–0.5)
Eosinophils Relative: 1 %
HCT: 25.9 % — ABNORMAL LOW (ref 36.0–46.0)
Hemoglobin: 8.6 g/dL — ABNORMAL LOW (ref 12.0–15.0)
Immature Granulocytes: 1 %
Lymphocytes Relative: 15 %
Lymphs Abs: 2.1 10*3/uL (ref 0.7–4.0)
MCH: 28.7 pg (ref 26.0–34.0)
MCHC: 33.2 g/dL (ref 30.0–36.0)
MCV: 86.3 fL (ref 80.0–100.0)
Monocytes Absolute: 1.4 10*3/uL — ABNORMAL HIGH (ref 0.1–1.0)
Monocytes Relative: 10 %
Neutro Abs: 10.3 10*3/uL — ABNORMAL HIGH (ref 1.7–7.7)
Neutrophils Relative %: 73 %
Platelets: 166 10*3/uL (ref 150–400)
RBC: 3 MIL/uL — ABNORMAL LOW (ref 3.87–5.11)
RDW: 14.5 % (ref 11.5–15.5)
WBC: 14.1 10*3/uL — ABNORMAL HIGH (ref 4.0–10.5)
nRBC: 0 % (ref 0.0–0.2)

## 2020-06-26 LAB — BASIC METABOLIC PANEL
Anion gap: 14 (ref 5–15)
BUN: 96 mg/dL — ABNORMAL HIGH (ref 8–23)
CO2: 23 mmol/L (ref 22–32)
Calcium: 8.8 mg/dL — ABNORMAL LOW (ref 8.9–10.3)
Chloride: 98 mmol/L (ref 98–111)
Creatinine, Ser: 5.24 mg/dL — ABNORMAL HIGH (ref 0.44–1.00)
GFR, Estimated: 8 mL/min — ABNORMAL LOW (ref >60)
Glucose, Bld: 137 mg/dL — ABNORMAL HIGH (ref 70–99)
Potassium: 3.1 mmol/L — ABNORMAL LOW (ref 3.5–5.1)
Sodium: 135 mmol/L (ref 135–145)

## 2020-06-26 LAB — URINE CULTURE

## 2020-06-26 LAB — STREP PNEUMONIAE URINARY ANTIGEN: Strep Pneumo Urinary Antigen: NEGATIVE

## 2020-06-26 MED ORDER — POTASSIUM CHLORIDE CRYS ER 20 MEQ PO TBCR
40.0000 meq | EXTENDED_RELEASE_TABLET | Freq: Once | ORAL | Status: AC
  Administered 2020-06-26: 40 meq via ORAL
  Filled 2020-06-26: qty 2

## 2020-06-26 MED ORDER — AMOXICILLIN-POT CLAVULANATE 500-125 MG PO TABS
1.0000 | ORAL_TABLET | ORAL | Status: DC
  Administered 2020-06-26: 500 mg via ORAL
  Filled 2020-06-26 (×2): qty 1

## 2020-06-26 MED ORDER — FOOD THICKENER (SIMPLYTHICK)
1.0000 | ORAL | Status: DC | PRN
  Filled 2020-06-26: qty 1

## 2020-06-26 MED ORDER — AMOXICILLIN-POT CLAVULANATE 875-125 MG PO TABS: 1.0000 | ORAL_TABLET | Freq: Two times a day (BID) | ORAL | Status: DC

## 2020-06-26 MED ORDER — HYDRALAZINE HCL 50 MG PO TABS: 50.0000 mg | ORAL_TABLET | Freq: Three times a day (TID) | ORAL | Status: DC

## 2020-06-26 MED ORDER — AMLODIPINE BESYLATE 10 MG PO TABS
10.0000 mg | ORAL_TABLET | Freq: Every day | ORAL | Status: DC
  Administered 2020-06-26 – 2020-06-27 (×2): 10 mg via ORAL
  Filled 2020-06-26 (×2): qty 1

## 2020-06-26 MED ORDER — TORSEMIDE 20 MG PO TABS
40.0000 mg | ORAL_TABLET | Freq: Every day | ORAL | Status: DC
  Administered 2020-06-26 – 2020-06-27 (×2): 40 mg via ORAL
  Filled 2020-06-26 (×2): qty 2

## 2020-06-26 NOTE — Progress Notes (Signed)
Heart Failure Navigator Progress Note  Assessed for Heart & Vascular TOC clinic readiness.  Unfortunately at this time the patient does not meet criteria due to advanced CKD. Follows with nephrology as outpatient and AV fistula has already been placed in anticipation for HD.   Navigator available for reassessment of patient.   Kerby Nora, PharmD, BCPS Heart Failure Stewardship Pharmacist Phone [phone]

## 2020-06-26 NOTE — Progress Notes (Signed)
PHARMACY NOTE:  ANTIMICROBIAL RENAL DOSAGE ADJUSTMENT  Current antimicrobial regimen includes a mismatch between antimicrobial dosage and estimated renal function.  As per policy approved by the Pharmacy & Therapeutics and Medical Executive Committees, the antimicrobial dosage will be adjusted accordingly.  Current antimicrobial dosage:  Augmentin '875mg'$  PO Q12h x 7 doses  Indication: Pneumonia  Renal Function:  Estimated Creatinine Clearance: 8.2 mL/min (A) (by C-G formula based on SCr of 5.24 mg/dL (H)). '[]'$      On intermittent HD, scheduled: '[]'$      On CRRT    Antimicrobial dosage has been changed to:  Augmentin '500mg'$  PO q24h x 4 days  Additional comments:   Merary Garguilo A. Levada Dy, PharmD, BCPS, FNKF Clinical Pharmacist [place] Please utilize Amion for appropriate phone number to reach the unit pharmacist ([place])  06/26/2020 1:38 PM

## 2020-06-26 NOTE — Progress Notes (Signed)
Inpatient Diabetes Program Recommendations  AACE/ADA: New Consensus Statement on Inpatient Glycemic Control (2015)  Target Ranges:  Prepandial:   less than 140 mg/dL      Peak postprandial:   less than 180 mg/dL (1-2 hours)      Critically ill patients:  140 - 180 mg/dL   Lab Results  Component Value Date   GLUCAP 158 (H) 06/26/2020   HGBA1C 7.3 (H) 05/11/2020    Review of Glycemic Control  Diabetes history: type 2 Outpatient Diabetes medications: Lantus 9 units at HS, Humalog 4-8 units TID with meals (per patient) Current orders for Inpatient glycemic control: Lantus 10 units at HS, Novolog SENSITIVE correction scale TID & HS scale, Novolog 2 units TID  Inpatient Diabetes Program Recommendations:   Spoke with patient on the phone. Patient states that she is wearing a [employer] CGM. Discussed with patient the fact that the blood sugars will still need to be checked with [place] sticks while in the hospital. States that her insulin dosages at home have been Lantus 9 units at HS, Humalog 4-8 units TID. States that she has plenty of insulin at home.   Will continue to monitor blood sugars while in the hospital.  Harvel Ricks RN BSN CDE Diabetes Coordinator Pager: [phone]  8am-5pm

## 2020-06-26 NOTE — Progress Notes (Addendum)
Subjective: No acute events overnight.  This morning, [name] is much improved. She is smiling and conversational sitting up at the bedside. She states her SOB and cough are improved but still mildly present. She is producing urine. She ate dinner and breakfast this morning without any issue. She understands importance of one more hospital day given comorbidities and severity index score.   Objective:  Vital signs in last 24 hours: Vitals:   06/25/20 1836 06/25/20 2021 06/26/20 0035 06/26/20 0511  BP: (!) 121/46 (!) 130/46 (!) 133/44 (!) 138/45  Pulse: (!) 56 (!) 58 (!) 58 62  Resp: '16 17 17 18  '$ Temp: 98 F (36.7 C) 98.5 F (36.9 C) 98.7 F (37.1 C) 98.3 F (36.8 C)  TempSrc: Oral Oral Oral Oral  SpO2: 100% 97% 100% 98%  Weight:    75 kg   Weight change:   Intake/Output Summary (Last 24 hours) at 06/26/2020 1209 Last data filed at 06/26/2020 [id number] Gross per 24 hour  Intake 0 ml  Output 500 ml  Net -500 ml   Physical Exam Constitutional:      Comments: Pleasant woman sitting up at edge of bed, conversational, smiling, non-ill appearing  Cardiovascular:     Rate and Rhythm: Normal rate and regular rhythm.     Comments: Holosystolic murmur  Pulmonary:     Effort: Pulmonary effort is normal. No tachypnea or respiratory distress.     Breath sounds: Normal breath sounds.  Abdominal:     General: Bowel sounds are normal.     Palpations: Abdomen is soft.     Tenderness: There is no abdominal tenderness.  Musculoskeletal:     Right lower leg: No edema.     Left lower leg: No edema.     Comments: Improved edema from yesterday No pitting  Skin:    General: Skin is warm.   Assessment/Plan:  Active Problems:   Pneumonia  This is hospital day #2 for [name] is an 80yo F with a PMH of CKD IV/V, HFpEF, CAD s/p DES PCI, aortic stenosis s/p TAVR, CVA x2, insulin-dependent DM, HTN, HLD, and RA who presents with shortness of breath with subsequent workup consistent with  CAP.   #Community acquired pneumonia, improving  CURB-65 score of 2. WBC 19.1 > 14.1, afebrile, normal RR, 98% on RA this morning. Much improved clinically with plan for discharge tomorrow. Urine Strep pneumo negative.  - Switching to oral therapy today: Augmentin and Azithromycin (day 2/5) - [place] for sat <94% - BMP and CBC tomorrow - Urine legionella: pending    #Elevated BNP #HFpEF BNP elevated to 1345 on admission, increased from 300 during hospitalization in April for a HF exacerbation. Today, mild improved edema with no pitting on LE and no crackles on examination. Weight on discharge in April 75.6 kg with no edema noted with that weight. Weight on admission now is 75.0 kg. She also has CKD, making BNP levels less reliable. BNP can also be elevated/skewed by DM and PNA.  - Negative 668m since admission s/p Lasix '40mg'$  last night - Restart home Torsemide today ('40mg'$  oral)   #AKI on CKD IV/V Fistula in place for potential HD in the future. Follows with Dr. UHollie Salk Baseline GFR 13-18. - Cr 4.76 > 5.24, GFR 9 > 8 - Restart home Torsemide today ('40mg'$  oral) - Cont Calcitriol and folic acid  - Avoid nephrotoxic medications - Renal diet - Strict I&Os   #Insulin-dependent type 2 DM #Diabetic retinopathy Controlled at home  with SSI and Lantus. Glucose 275 > 137 on BMP, within goal.  - Cont SSI and Lantus 10U - Cont brimonidine and Liquifilm eye drops   #Hypokalemia 3.0 > 3.1 s/p 16mq. - Additional 491m KCl today   #CAD #Aortic stenosis s/p TAVR #CVA #HLD #1st Degree AV Block History of CAD with DES PCI. Two prior CVAs with no residual deficits.  - Cont ASA - Cont Crestor - HR cont to be in 50-60s (at baseline)    #HTN On Imdur, Labetalol, Torsemide, Amlodipine, and Hydralazine at home. - Restarting Amlodipine '10mg'$  daily and Torsemide '40mg'$  daily today as BP increases    #Depression - Cont home Effexor   LOS: 0 days   RaMalachi CarlMedical Student 06/26/2020, 7:01 AM

## 2020-06-26 NOTE — TOC Initial Note (Signed)
Transition of Care [facility]) - Initial/Assessment Note    Patient Details  Name: [name] MRN: [id number] Date of Birth: 1940-09-15  [facility]) CM/SW Contact:    Marilu Favre, RN Phone Number: 06/26/2020, 1:12 PM  Clinical Narrative:                 Patient from home . Daughter assists. Active with [facility] for PT/OT . Confirmed with Stacie with [facility]. Resumption of care orders entered. Patient has walker and 3 in 1 at home already.  Expected Discharge Plan: [place] Barriers to Discharge: Continued Medical Work up   Patient Goals and CMS Choice Patient states their goals for this hospitalization and ongoing recovery are:: to return to home CMS Medicare.gov Compare Post Acute Care list provided to:: Patient Choice offered to / list presented to : Patient  Expected Discharge Plan and Services Expected Discharge Plan: [place]   Discharge Planning Services: CM Consult Post Acute Care Choice: [place] arrangements for the past 2 months: Single Family Home                 DME Arranged: N/A         HH Arranged: PT, OT HH Agency: [facility] (now [facility]) Date [place]: 06/26/20 Time [place]: 1311 Representative spoke with at [place]: [place] Arrangements/Services Living arrangements for the past 2 months: [place]   Patient language and need for interpreter reviewed:: Yes        Need for Family Participation in Patient Care: Yes (Comment) Care giver support system in place?: Yes (comment) Current home services: DME, Home PT, Home OT Criminal Activity/Legal Involvement Pertinent to Current Situation/Hospitalization: No - Comment as needed  Activities of Daily Living Home Assistive Devices/Equipment: CBG Meter, Walker (specify type) ADL Screening (condition at time of admission) Patient's cognitive ability adequate to safely  complete daily activities?: Yes Is the patient deaf or have difficulty hearing?: No Does the patient have difficulty seeing, even when wearing glasses/contacts?: No Does the patient have difficulty concentrating, remembering, or making decisions?: No Patient able to express need for assistance with ADLs?: Yes Does the patient have difficulty dressing or bathing?: No Independently performs ADLs?: Yes (appropriate for developmental age) Does the patient have difficulty walking or climbing stairs?: No Weakness of Legs: Both Weakness of Arms/Hands: None  Permission Sought/Granted   Permission granted to share information with : No              Emotional Assessment Appearance:: Appears stated age Attitude/Demeanor/Rapport: Engaged Affect (typically observed): Accepting Orientation: : Oriented to Self, Oriented to Place, Oriented to  Time, Oriented to Situation Alcohol / Substance Use: Not Applicable Psych Involvement: No (comment)  Admission diagnosis:  Pneumonia [J18.9] Elevated troponin [R77.8] Community acquired pneumonia of right upper lobe of lung [J18.9] Acute on chronic congestive heart failure, unspecified heart failure type [facility]) [I50.9] Patient Active Problem List   Diagnosis Date Noted   Pneumonia 06/25/2020   PVD (peripheral vascular disease) with claudication ([place]) 06/04/2020   Cardiovascular renal disease, stage 1-4 or unspecified chronic kidney disease, without heart failure 05/13/2020   SOB (shortness of breath) 05/12/2020   Cerebrovascular accident (CVA) due to embolism of right posterior cerebral artery ([place]) 05/11/2020   CHF (congestive heart failure) ([place]) 05/11/2020   Stable treated proliferative diabetic retinopathy of right eye without macular edema determined by examination associated with  type 2 diabetes mellitus ([place]) 03/02/2020   Posterior vitreous detachment of right eye 03/02/2020   Anemia 02/28/2020   Acute on chronic renal failure ([place]) 02/28/2020    Hypokalemia    Diabetic peripheral neuropathy (HCC)    Benign essential HTN    Cerebral infarction due to embolism of right anterior cerebral artery ([place]) 02/19/2020   Ischemic stroke of frontal lobe ([place]) 02/17/2020   Cerebral embolism with cerebral infarction 02/12/2020   Thrombocytopenia ([place]) 02/10/2020   Acute left-sided weakness 02/09/2020   Weakness 02/08/2020   Chronic heart failure with preserved ejection fraction ([place]) 01/02/2020   Atypical chest pain 05/31/2019   Central retinal vein occlusion, left eye, stable 05/29/2019   Central retinal vein occlusion of left eye 05/28/2019   Proliferative diabetic retinopathy of left eye with macular edema associated with type 2 diabetes mellitus ([place]) 05/28/2019   Acute on chronic combined systolic and diastolic CHF (congestive heart failure) ([place]) 11/13/2018   S/P TAVR (transcatheter aortic valve replacement) 11/13/2018   Hypertension    Hyperlipidemia    Renal lesion    Severe aortic stenosis 10/23/2018   CKD (chronic kidney disease), stage IV ([place]) 12/05/2017   Coronary artery disease of native artery of native heart with stable angina pectoris ([place]) 01/07/2014   S/P PTCA (percutaneous transluminal coronary angioplasty) 01/07/2014   History of ischemic anterior cerebral artery stroke 01/30/2013   PCP:  Audley Hose, MD Pharmacy:   [id number]: [phone]: [phone]    Social Determinants of Health (SDOH) Interventions    Readmission Risk Interventions No flowsheet data found.

## 2020-06-26 NOTE — Progress Notes (Signed)
  Date: 06/26/2020  Patient name: [name]  Medical record number: [id number]  Date of birth: Apr 07, 1940   I have seen and evaluated [name] and discussed their care with the Residency Team. Briefly, [name] presented with SOB, cough, DOE, body aches and chills.  She was found to have a right upper lobe pneumonia.  She has improved with antibiotics.  She was seen sitting on the side of the bed this morning and reports feeling better.   Vitals:   06/26/20 0511 06/26/20 1228  BP: (!) 138/45 (!) 131/42  Pulse: 62 62  Resp: 18 16  Temp: 98.3 F (36.8 C) 98.4 F (36.9 C)  SpO2: 98% 100%   Gen: Sitting on side of bed, elderly woman, no distress Eyes: Anicteric sclerae HENT: No oxygen needed, MMM CV: RR, NR, holosystolic murmur heard best at LUSB, mild non pitting pedal edema Pulm: CTAB, no wheezing or crackles, no rales at RUL Abd: Soft, +BS MSK: Normal tone and bulk, fistula on left upper arm with palpable thrill Skin: No rash on exposed skin Psych: Pleasant, normal mood.   Assessment and Plan: I have seen and evaluated the patient as outlined above. I agree with the formulated Assessment and Plan as detailed in the residents' note, with the following changes:   1. CAP - Transition to oral medications today and monitor for fever - Oxygen as needed - Encourage PO intake - Urine strep antigen negative - Urine legionella pending (low Na on admission)  2. AKI on CKD stage 4 - She has a fistula in place, follows with nephrology - Renal function worsened today after diuresis - Monitor I/Os for oliguria - Encourage PO intake -  If no improved in renal function tomorrow, would discuss further with the nephrology team.   Other issues per [name]'s note.   Sid Falcon, MD 6/10/20223:23 PM

## 2020-06-27 LAB — CBC
HCT: 27.2 % — ABNORMAL LOW (ref 36.0–46.0)
Hemoglobin: 9.1 g/dL — ABNORMAL LOW (ref 12.0–15.0)
MCH: 28.9 pg (ref 26.0–34.0)
MCHC: 33.5 g/dL (ref 30.0–36.0)
MCV: 86.3 fL (ref 80.0–100.0)
Platelets: 196 10*3/uL (ref 150–400)
RBC: 3.15 MIL/uL — ABNORMAL LOW (ref 3.87–5.11)
RDW: 14.4 % (ref 11.5–15.5)
WBC: 11.6 10*3/uL — ABNORMAL HIGH (ref 4.0–10.5)
nRBC: 0 % (ref 0.0–0.2)

## 2020-06-27 LAB — COMPREHENSIVE METABOLIC PANEL
ALT: 51 U/L — ABNORMAL HIGH (ref 0–44)
AST: 104 U/L — ABNORMAL HIGH (ref 15–41)
Albumin: 2.7 g/dL — ABNORMAL LOW (ref 3.5–5.0)
Alkaline Phosphatase: 58 U/L (ref 38–126)
Anion gap: 15 (ref 5–15)
BUN: 99 mg/dL — ABNORMAL HIGH (ref 8–23)
CO2: 20 mmol/L — ABNORMAL LOW (ref 22–32)
Calcium: 8.9 mg/dL (ref 8.9–10.3)
Chloride: 99 mmol/L (ref 98–111)
Creatinine, Ser: 5.46 mg/dL — ABNORMAL HIGH (ref 0.44–1.00)
GFR, Estimated: 7 mL/min — ABNORMAL LOW (ref >60)
Glucose, Bld: 159 mg/dL — ABNORMAL HIGH (ref 70–99)
Potassium: 3.5 mmol/L (ref 3.5–5.1)
Sodium: 134 mmol/L — ABNORMAL LOW (ref 135–145)
Total Bilirubin: 0.9 mg/dL (ref 0.3–1.2)
Total Protein: 5.8 g/dL — ABNORMAL LOW (ref 6.5–8.1)

## 2020-06-27 LAB — LEGIONELLA PNEUMOPHILA SEROGP 1 UR AG: L. pneumophila Serogp 1 Ur Ag: NEGATIVE

## 2020-06-27 LAB — GLUCOSE, CAPILLARY
Glucose-Capillary: 164 mg/dL — ABNORMAL HIGH (ref 70–99)
Glucose-Capillary: 143 mg/dL — ABNORMAL HIGH (ref 70–99)

## 2020-06-27 MED ORDER — AMOXICILLIN-POT CLAVULANATE 500-125 MG PO TABS: 1.0000 | ORAL_TABLET | Freq: Every day | ORAL | 0 refills | Status: AC

## 2020-06-27 MED ORDER — INSULIN GLARGINE 100 UNIT/ML ~~LOC~~ SOLN: 9.0000 [IU] | Freq: Every day | SUBCUTANEOUS | Status: DC

## 2020-06-27 MED ORDER — AZITHROMYCIN 250 MG PO TABS: ORAL_TABLET | ORAL | 0 refills | Status: AC

## 2020-06-27 MED ORDER — AMOXICILLIN-POT CLAVULANATE 500-125 MG PO TABS
1.0000 | ORAL_TABLET | ORAL | Status: DC
  Administered 2020-06-27: 500 mg via ORAL
  Filled 2020-06-27: qty 1

## 2020-06-27 NOTE — Progress Notes (Signed)
Patient has ordered for discharge. Given discharge instructions with paper to the patient. Iv removed. All belongings given to the patient.

## 2020-06-27 NOTE — Discharge Summary (Addendum)
Name: [name] MRN: [id number] DOB: 04-Jan-1941 80 y.o. PCP: Audley Hose, MD  Date of Admission: 06/25/2020 12:06 PM Date of Discharge: 06/27/2020 Attending Physician: Sid Falcon, MD  Discharge Diagnosis: 1. RUL lobar pneumonia 2. HFpEF 3. AKI on CKD IV  Discharge Medications: Allergies as of 06/27/2020   No Known Allergies      Medication List     STOP taking these medications    cetirizine 10 MG tablet Commonly known as: ZYRTEC   desvenlafaxine 100 MG 24 hr tablet Commonly known as: PRISTIQ   doxycycline 100 MG capsule Commonly known as: VIBRAMYCIN   labetalol 300 MG tablet Commonly known as: NORMODYNE   metolazone 2.5 MG tablet Commonly known as: ZAROXOLYN   nitroGLYCERIN 0.4 MG SL tablet Commonly known as: NITROSTAT   Repatha SureClick [id number] MG/ML Soaj Generic drug: Evolocumab       TAKE these medications    acetaminophen 325 MG tablet Commonly known as: TYLENOL Take 650 mg by mouth every 6 (six) hours as needed for moderate pain or headache.   allopurinol 100 MG tablet Commonly known as: ZYLOPRIM Take 50 mg by mouth daily.   amoxicillin-clavulanate 500-125 MG tablet Commonly known as: Augmentin Take 1 tablet (500 mg total) by mouth daily for 2 days. Start taking on: June 28, 2020   aspirin EC 81 MG tablet Take 1 tablet (81 mg total) by mouth daily. Swallow whole.   azithromycin 250 MG tablet Commonly known as: ZITHROMAX Take one tablet daily Start taking on: June 28, 2020   brimonidine 0.2 % ophthalmic solution Commonly known as: ALPHAGAN Place 1 drop into the left eye daily.   calcitRIOL 0.25 MCG capsule Commonly known as: ROCALTROL Take 0.25 mcg by mouth daily.   ezetimibe 10 MG tablet Commonly known as: ZETIA Take 10 mg by mouth daily.   folic acid 1 MG tablet Commonly known as: FOLVITE Take 1 mg by mouth daily.   HumaLOG KwikPen 100 UNIT/ML KwikPen Generic drug: insulin lispro Inject 4-8 Units into the skin  See admin instructions. Inject 4-8 units into the skin 3 times a day before meals, per sliding scale   hydrALAZINE 50 MG tablet Commonly known as: APRESOLINE Take 1 tablet (50 mg total) by mouth every 8 (eight) hours. What changed: how much to take   insulin glargine 100 UNIT/ML injection Commonly known as: LANTUS Inject 0.09 mLs (9 Units total) into the skin at bedtime.   isosorbide mononitrate 120 MG 24 hr tablet Commonly known as: IMDUR Take 1 tablet (120 mg total) by mouth daily.   leflunomide 10 MG tablet Commonly known as: ARAVA Take 10 mg by mouth daily.   polyethylene glycol 17 g packet Commonly known as: MIRALAX / GLYCOLAX Take 17 g by mouth daily. What changed:  when to take this reasons to take this   polyvinyl alcohol 1.4 % ophthalmic solution Commonly known as: LIQUIFILM TEARS Place 1 drop into both eyes 3 (three) times daily as needed for dry eyes.   rosuvastatin 40 MG tablet Commonly known as: CRESTOR Take 1 tablet (40 mg total) by mouth daily.   senna-docusate 8.6-50 MG tablet Commonly known as: Senokot-S Take 2 tablets by mouth 2 (two) times daily. What changed:  when to take this reasons to take this   torsemide 20 MG tablet Commonly known as: DEMADEX Take 2 tablets (40 mg total) by mouth daily.   venlafaxine XR 150 MG 24 hr capsule Commonly known as: EFFEXOR-XR Take 1 capsule (  150 mg total) by mouth daily.       ASK your doctor about these medications    amLODipine 10 MG tablet Commonly known as: NORVASC TAKE 1 TABLET BY MOUTH EVERY DAY       Disposition and follow-up:   [name] was discharged from [facility] in Good condition.  At the hospital follow up visit please address:  1.  Breathing/improvement from pneumonia  2.  Labs / imaging needed at time of follow-up: CXR (end of June/early July for resolution of PNA), CMP, CBC   3.  Pending labs/ test needing follow-up: Legionella urine   Follow-up  Appointments:  Follow-up Information     [facility] [place] [facility].   Specialty: Home Health Services Contact information: [address] [place] [address] [phone]                Hospital Course by problem list: RUL lobar community acquired pneumonia: [name] was admitted after progressive shortness of breath at home after diagnosis of PNA at an Urgent Care. Her CURB-65 score was 2, placing her at moderate risk. She was admitted with a WBC of 19.1 but was stable on RA and afebrile. She was started on IV ceftriaxone and azithromycin. After one day, she was transitioned to oral Augmentin and continued azithromycin. On day of discharge, she was well-appearing with much improved SOB. Her WBC was down to 11.6, she was afebrile, satting 100% on RA. Strep pneumo was negative. Legionella still pending at discharge. She will be discharge with antibiotics (oral Augmentin and Azithromycin) through Monday, June 13 to complete a 5 day course.  Elevated BNP with a history of HFpEF: On admission, her BNP was elevated to 1345, up from 300s during an admission in April for a HF exacerbation. On admission, she weighed 75.0kg (down from 75.6kg dry weight in April) with elevated JVD and mild LE edema (no pitting). She was given IV Lasix once and then started back on her home Torsemide, which she is adherent to. On day of discharge, LE edema completely resolved. No other evidence of volume overload. She was net negative -59m (per chart). She will f/u with her PCP and continue her Torsemide at discharge.  AKI on CKD stage IV: She follows with Dr. UHollie Salkand has a fistula in place for potential HD in the future. On admission, Cr 4.75 with GFR of 9. This uptrended to 5.46 and GFR of 7 on day of discharge. She continued her calcitriol and folic acid during admission. Dr. SJonnie Finner nephrologist, was consulted on day of discharge and stated no need for any HD at this time; her Cr can fluctuate  given CKD stage IV. She will continue to follow with Dr. UHollie Salk continue home Torsemide, and continue home Calcitriol and folic acid at discharge. Insulin-dependent type 2 DM with diabetic retinopathy: She is controlled at home with SSI and Lantus. She was continued on her home insulin her in the hospital with BCG within goal (140-180). She continued home eye drops (brimonidine and Liquifilm). No acute diabetic concerns. She can continue home insulin at discharge. Hypokalemia: She had a K of 3.0 on admission. This was repleted with KCl with a K of 3.5 on day of discharge. CAD, aortic stenosis s/p TAVR, CVA x2, HLD, 1st degree AV block: She has a history of CAD with a DES PCI. She's also had two CVAs with no residual deficits. Her home aspirin and crestor were continued. Her HR  was in the 50-60s throughout admission, which is her known baseline with her 1st degree AV block. She can continue these medicines at discharge. HTN: She takes Imdur, Labetalol, Torsemide, Amlodipine, and Hydralazine at home. Only Amlodipine and Torsemide were restarted in the hospital on her second day of admission as her BP increased. Will not continue Labetalol at this time given BP in 130s and low with diastolic [id number]. She can f/u with PCP and increase at that time if necessary.   Depression: She was continued on home Effexor with no acute concerns. Transaminitis, likely drug induced: On day of discharge, AST 104 and ALT 51, up from normal on admission labs. The only new drugs administered were Ceftriaxone, Azithromycin, and Augmentin. All three of these can cause a mild transaminitis that is asymptomatic and self-resolves. As such, this is most likely etiology. No acute concerns.  Anemia: Her Hgb was stable during admission between 8.6-10, which is her baseline. Likely anemia of CKD given history. No acute concerns.   Discharge Exam:   BP (!) 136/42 (BP Location: Right Arm)   Pulse 64   Temp 98.3 F (36.8 C) (Oral)   Resp 19    Ht '5\' 3"'$  (1.6 m)   Wt 74.1 kg   SpO2 100%   BMI 28.94 kg/m  Discharge exam:  Physical Exam Constitutional:      Comments: Well-appearing female, sitting on edge of bed, smiling  Cardiovascular:     Rate and Rhythm: Normal rate and regular rhythm.     Comments: Holosystolic murmur Pulmonary:     [place]: Pulmonary effort is normal. No tachypnea or respiratory distress.     Breath sounds: Normal breath sounds. No decreased breath sounds, wheezing or rhonchi.  Abdominal:     Palpations: Abdomen is soft.     Tenderness: There is no abdominal tenderness.  Musculoskeletal:     Right lower leg: No edema.     Left lower leg: No edema.     Comments: Improved LE edema   Pertinent Labs, Studies, and Procedures:  CXR on 07-17-22: Stable right upper lobe airspace opacity is noted most consistent with pneumonia. Follow-up PA and lateral chest X-ray is recommended in 3-4 weeks following trial of antibiotic therapy to ensure resolution and exclude underlying malignancy.  Discharge Instructions: [name], it was a pleasure taking care you while at [facility]. We treated you for a pneumonia of your lungs. We gave you antibiotics and will send you home for a few more days of antibiotics. Instructions are below: Antibiotics: Please finish your Augmentin and Azithromycin through Monday, June 13. Chest x-ray: At your hospital follow-up appointment, please ensure they order a Chest X-ray to be done near the end of June/early July to ensure your pneumonia has resolved. Blood pressure: Please do not take Labetalol at this time. You can continue your other blood pressure medicines. Once you follow up with your primary doctor, they can restart Labetalol if needed.  Signed: Malachi Carl, Medical Student 06/27/2020, 9:06 AM    Attestation for Student Documentation:  I personally was present and performed or re-performed the history, physical exam and medical decision-making activities of this service and have  verified that the service and findings are accurately documented in the student's note.  Madalyn Rob, MD 06/27/2020, 11:39 AM

## 2020-06-27 NOTE — Discharge Instructions (Addendum)
Ms. Mccrossen, it was a pleasure taking care you while at [facility]. We treated you for a pneumonia of your lungs. We gave you antibiotics and will send you home for a few more days of antibiotics. Instructions are below: Antibiotics: Please finish your Augmentin and Azithromycin through Monday, June 13. Chest x-ray: At your hospital follow-up appointment, please ensure they order a Chest X-ray to be done near the end of June/early July to ensure your pneumonia has resolved. Blood pressure: Please do not take Labetalol at this time. You can continue your other blood pressure medicines. Once you follow up with your primary doctor, they can restart Labetalol if needed.

## 2020-06-30 ENCOUNTER — Other Ambulatory Visit: Payer: Self-pay

## 2020-06-30 ENCOUNTER — Ambulatory Visit (INDEPENDENT_AMBULATORY_CARE_PROVIDER_SITE_OTHER): Payer: Medicare PPO | Admitting: Ophthalmology

## 2020-06-30 ENCOUNTER — Encounter (INDEPENDENT_AMBULATORY_CARE_PROVIDER_SITE_OTHER): Payer: Self-pay | Admitting: Ophthalmology

## 2020-06-30 DIAGNOSIS — H43811 Vitreous degeneration, right eye: Secondary | ICD-10-CM | POA: Diagnosis not present

## 2020-06-30 DIAGNOSIS — H348122 Central retinal vein occlusion, left eye, stable: Secondary | ICD-10-CM | POA: Diagnosis not present

## 2020-06-30 DIAGNOSIS — E113551 Type 2 diabetes mellitus with stable proliferative diabetic retinopathy, right eye: Secondary | ICD-10-CM | POA: Diagnosis not present

## 2020-06-30 DIAGNOSIS — H34812 Central retinal vein occlusion, left eye, with macular edema: Secondary | ICD-10-CM | POA: Diagnosis not present

## 2020-06-30 NOTE — Assessment & Plan Note (Signed)
Stable OD no Rx required

## 2020-06-30 NOTE — Progress Notes (Signed)
06/30/2020     CHIEF COMPLAINT Patient presents for Retina Follow Up (4 month fu OU and OCT/Pt states, "My vision seems to be more clouded over and worse in my left eye."/A1C:6.8/LBS: 109/Pt reports using Brimonidine Qday OS/)   HISTORY OF PRESENT ILLNESS: [name] is a 80 y.o. female who presents to the clinic today for:   HPI     Retina Follow Up           Diagnosis: Diabetic Retinopathy   Laterality: both eyes   Onset: 4 months ago   Severity: mild   Duration: months   Comments: 4 month fu OU and OCT Pt states, "My vision seems to be more clouded over and worse in my left eye." A1C:6.8 LBS: 109 Pt reports using Brimonidine Qday OS        Last edited by Kendra Opitz, COA on 06/30/2020  8:58 AM.      Referring physician: Audley Hose, MD [place],  [place] [address]  HISTORICAL INFORMATION:   Selected notes from the MEDICAL RECORD NUMBER    Lab Results  Component Value Date   HGBA1C 7.3 (H) 05/11/2020     CURRENT MEDICATIONS: Current Outpatient Medications (Ophthalmic Drugs)  Medication Sig   brimonidine (ALPHAGAN) 0.2 % ophthalmic solution Place 1 drop into the left eye daily.   polyvinyl alcohol (LIQUIFILM TEARS) 1.4 % ophthalmic solution Place 1 drop into both eyes 3 (three) times daily as needed for dry eyes.   No current facility-administered medications for this visit. (Ophthalmic Drugs)   Current Outpatient Medications (Other)  Medication Sig   acetaminophen (TYLENOL) 325 MG tablet Take 650 mg by mouth every 6 (six) hours as needed for moderate pain or headache.    allopurinol (ZYLOPRIM) 100 MG tablet Take 50 mg by mouth daily.   amLODipine (NORVASC) 10 MG tablet TAKE 1 TABLET BY MOUTH EVERY DAY (Patient taking differently: Take 10 mg by mouth daily.)   amoxicillin-clavulanate (AUGMENTIN) 500-125 MG tablet Take 1 tablet (500 mg total) by mouth daily for 2 days.   aspirin EC 81 MG tablet Take 1 tablet (81 mg  total) by mouth daily. Swallow whole.   calcitRIOL (ROCALTROL) 0.25 MCG capsule Take 0.25 mcg by mouth daily.    ezetimibe (ZETIA) 10 MG tablet Take 10 mg by mouth daily.   folic acid (FOLVITE) 1 MG tablet Take 1 mg by mouth daily.   HUMALOG KWIKPEN 100 UNIT/ML KwikPen Inject 4-8 Units into the skin See admin instructions. Inject 4-8 units into the skin 3 times a day before meals, per sliding scale   hydrALAZINE (APRESOLINE) 50 MG tablet Take 1 tablet (50 mg total) by mouth every 8 (eight) hours.   insulin glargine (LANTUS) 100 UNIT/ML injection Inject 0.09 mLs (9 Units total) into the skin at bedtime.   isosorbide mononitrate (IMDUR) 120 MG 24 hr tablet Take 1 tablet (120 mg total) by mouth daily.   leflunomide (ARAVA) 10 MG tablet Take 10 mg by mouth daily.   polyethylene glycol (MIRALAX / GLYCOLAX) 17 g packet Take 17 g by mouth daily.   rosuvastatin (CRESTOR) 40 MG tablet Take 1 tablet (40 mg total) by mouth daily.   senna-docusate (SENOKOT-S) 8.6-50 MG tablet Take 2 tablets by mouth 2 (two) times daily.   torsemide (DEMADEX) 20 MG tablet Take 2 tablets (40 mg total) by mouth daily.   venlafaxine XR (EFFEXOR-XR) 150 MG 24 hr capsule Take 1 capsule (150 mg  total) by mouth daily.   Current Facility-Administered Medications (Other)  Medication Route   Evolocumab SOAJ 140 mg Subcutaneous      REVIEW OF SYSTEMS:    ALLERGIES No Known Allergies  PAST MEDICAL HISTORY Past Medical History:  Diagnosis Date   Arthritis    Cerebrovascular disease    CKD (chronic kidney disease)    Sees Dr Hollie Salk   Coronary artery disease    Depression    Diabetes ([place])    INSULIN DEPENDENT   Diabetic peripheral neuropathy (HCC)    Diabetic retinopathy ([place])    Diverticulitis    GERD (gastroesophageal reflux disease)    History of CVA (cerebrovascular accident)    x 2 no residulal   Hyperlipidemia    Hypertension    Obesity    Renal lesion    S/P TAVR (transcatheter aortic valve replacement)  11/13/2018   s/p TAVR with a 61m Edwards S3U via the TF approach by Dr. ORoxy Mannsand Dr. MAngelena Form  Severe aortic stenosis    Past Surgical History:  Procedure Laterality Date   ABDOMINAL HYSTERECTOMY     AV FISTULA PLACEMENT Left 12/18/2017   Procedure: ARTERIOVENOUS (AV) FISTULA CREATION ARM;  Surgeon: CMarty Heck MD;  [facility]  Service: Vascular;  Laterality: Left;   bilateral cataract surgery     CARDIAC CATHETERIZATION  01/07/2014   DR GEinar Gip  COLON RESECTION  02/1999   COLONOSCOPY     COLOSTOMY  02/1999   COLOSTOMY CLOSURE  07/1999   EYE SURGERY Left 2019   FISTULA SUPERFICIALIZATION Left 06/29/2018   Procedure: FISTULA SUPERFICIALIZATION LEFT BRACHIOCEPHALIC;  Surgeon: CMarty Heck MD;  [facility]  Service: Vascular;  Laterality: Left;   LEFT HEART CATHETERIZATION WITH CORONARY ANGIOGRAM N/A 01/07/2014   Procedure: LEFT HEART CATHETERIZATION WITH CORONARY ANGIOGRAM;  Surgeon: JLaverda Page MD;  [facility];  Service: Cardiovascular;  Laterality: N/A;   LOOP RECORDER INSERTION N/A 02/17/2020   Procedure: LOOP RECORDER INSERTION;  Surgeon: KDeboraha Sprang MD;  [facility];  Service: Cardiovascular;  Laterality: N/A;   middle cerebral artery stent placement Right    OTHER SURGICAL HISTORY     laser surgery   PTCA  01/07/2014   DES to RCA    DR GEinar Gip  right knee surgery Right    for infection   RIGHT/LEFT HEART CATH AND CORONARY ANGIOGRAPHY N/A 10/23/2018   Procedure: RIGHT/LEFT HEART CATH AND CORONARY ANGIOGRAPHY;  Surgeon: PNigel Mormon MD;  [facility];  Service: Cardiovascular;  Laterality: N/A;   TEE WITHOUT CARDIOVERSION N/A 11/13/2018   Procedure: TRANSESOPHAGEAL ECHOCARDIOGRAM (TEE);  Surgeon: MBurnell Blanks MD;  [facility]  Service: Open Heart Surgery;  Laterality: N/A;   TRANSCATHETER AORTIC VALVE REPLACEMENT, TRANSFEMORAL  11/13/2018   TRANSCATHETER AORTIC VALVE  REPLACEMENT, TRANSFEMORAL N/A 11/13/2018   Procedure: TRANSCATHETER AORTIC VALVE REPLACEMENT, TRANSFEMORAL;  Surgeon: MBurnell Blanks MD;  [facility]  Service: Open Heart Surgery;  Laterality: N/A;    FAMILY HISTORY Family History  Problem Relation Age of Onset   Diabetes Mother    Heart disease Mother    Prostate cancer Father    Hypertension Brother    Prostate cancer Brother     SOCIAL HISTORY Social History   Tobacco Use   Smoking status: Never   Smokeless tobacco: Never  Vaping Use   Vaping Use: Never used  Substance Use Topics   Alcohol use:  No   Drug use: No         OPHTHALMIC EXAM:  Base Eye Exam     Visual Acuity (ETDRS)       Right Left   Dist [place] 20/20 -2 CF at 3'         Tonometry (Tonopen, 9:01 AM)       Right Left   Pressure 14 18         Pupils       Pupils Dark Light Shape React APD   Right PERRL 4 4 Round Minimal None   Left PERRL 5 5 Irregular Minimal None         Visual Fields       Left Right   Restrictions Total inferior temporal, superior nasal, inferior nasal deficiencies Total superior temporal deficiency         Extraocular Movement       Right Left    Full Full         Neuro/Psych     Oriented x3: Yes   Mood/Affect: Normal         Dilation     Both eyes: 1.0% Mydriacyl, 2.5% Phenylephrine @ 9:01 AM           Slit Lamp and Fundus Exam     External Exam       Right Left   External Normal Normal         Slit Lamp Exam       Right Left   Lids/Lashes Normal Normal   Conjunctiva/Sclera White and quiet White and quiet   Cornea Band keratopathy at limbus not in vis axis Clear   Anterior Chamber Deep and quiet Deep and quiet   Iris Round and reactive Round and reactive   Lens Centered posterior chamber intraocular lens Centered anterior chamber intraocular lens   Anterior Vitreous Normal Normal         Fundus Exam       Right Left   Posterior Vitreous Posterior  vitreous detachment vitrectomized   Disc Normal Normal, pink   C/D Ratio 0.6 0.6   Macula Normal  Cystoid macular edema less, no exudates, Macular thickening, Microaneurysms, no cystoid macular edema   Vessels NPDR-Severe, stable old CRVO   Periphery good peripheral prp good prp             IMAGING AND PROCEDURES  Imaging and Procedures for 06/30/20  OCT, Retina - OU - Both Eyes       Right Eye Quality was good. Scan locations included subfoveal. Central Foveal Thickness: 259. Progression has been stable. Findings include normal foveal contour.   Left Eye Quality was good. Scan locations included subfoveal. Central Foveal Thickness: 236. Progression has been stable. Findings include abnormal foveal contour.   Notes Macular thickening particularly nasal to the fovea but vastly improved as compared to January 07, 2020 prior to focal laser treatment, OS,  Off intravitreal Avastin OS since 10/21/2019  OD normal macular findings incidental posterior vitreous detachment  Observe OU             ASSESSMENT/PLAN:  Central retinal vein occlusion, left eye, stable Focal element of CSME nasal to the fovea has improved after recent focal laser treatment delivered within the last 6 months  Posterior vitreous detachment of right eye Stable OD no Rx required  Stable treated proliferative diabetic retinopathy of right eye without macular edema determined by examination associated with type 2 diabetes mellitus (HCC) Stable OD  ICD-10-CM   1. Stable treated proliferative diabetic retinopathy of right eye without macular edema determined by examination associated with type 2 diabetes mellitus ([place])  E11.3551 OCT, Retina - OU - Both Eyes    2. Central retinal vein occlusion with macular edema of left eye  H34.8120 OCT, Retina - OU - Both Eyes    3. Central retinal vein occlusion, left eye, stable  H34.8122     4. Posterior vitreous detachment of right eye  H43.811        1.  Very severe NPDR treated peripherally with PRP to prevent progression to PDR.    2.  OS with history of CRV O superimposed upon NPDR.  Longstanding with macular scarring macular atrophy and much less CME now post years of injection antivegF followed by focal laser treatment some 5 months previous and for region nasal to the fovea  3.  Observe OU for now  Ophthalmic Meds Ordered this visit:  No orders of the defined types were placed in this encounter.      Return in about 4 months (around 10/30/2020) for DILATE OU, COLOR FP, OCT.  There are no Patient Instructions on file for this visit.   Explained the diagnoses, plan, and follow up with the patient and they expressed understanding.  Patient expressed understanding of the importance of proper follow up care.   Clent Demark Nijae Doyel [name] Diseases & Surgery of the Retina and Vitreous Retina & Diabetic [facility] 06/30/20     Abbreviations: M myopia (nearsighted); A astigmatism; H hyperopia (farsighted); P presbyopia; Mrx spectacle prescription;  CTL contact lenses; OD right eye; OS left eye; OU both eyes  XT exotropia; ET esotropia; PEK punctate epithelial keratitis; PEE punctate epithelial erosions; DES dry eye syndrome; MGD meibomian gland dysfunction; ATs artificial tears; PFAT's preservative free artificial tears; [place] nuclear sclerotic cataract; PSC posterior subcapsular cataract; ERM epi-retinal membrane; PVD posterior vitreous detachment; RD retinal detachment; DM diabetes mellitus; DR diabetic retinopathy; NPDR non-proliferative diabetic retinopathy; PDR proliferative diabetic retinopathy; CSME clinically significant macular edema; DME diabetic macular edema; dbh dot blot hemorrhages; CWS cotton wool spot; POAG primary open angle glaucoma; C/D cup-to-disc ratio; HVF humphrey visual field; GVF goldmann visual field; OCT optical coherence tomography; IOP intraocular pressure; BRVO Branch retinal vein occlusion; CRVO central  retinal vein occlusion; CRAO central retinal artery occlusion; BRAO branch retinal artery occlusion; RT retinal tear; SB scleral buckle; PPV pars plana vitrectomy; VH Vitreous hemorrhage; PRP panretinal laser photocoagulation; IVK intravitreal kenalog; VMT vitreomacular traction; MH Macular hole;  NVD neovascularization of the disc; NVE neovascularization elsewhere; AREDS age related eye disease study; ARMD age related macular degeneration; POAG primary open angle glaucoma; EBMD epithelial/anterior basement membrane dystrophy; ACIOL anterior chamber intraocular lens; IOL intraocular lens; PCIOL posterior chamber intraocular lens; Phaco/IOL phacoemulsification with intraocular lens placement; [place] photorefractive keratectomy; LASIK laser assisted in situ keratomileusis; HTN hypertension; DM diabetes mellitus; COPD chronic obstructive pulmonary disease

## 2020-06-30 NOTE — Assessment & Plan Note (Signed)
Stable OD °

## 2020-06-30 NOTE — Assessment & Plan Note (Signed)
Focal element of CSME nasal to the fovea has improved after recent focal laser treatment delivered within the last 6 months

## 2020-07-02 ENCOUNTER — Telehealth: Payer: Self-pay

## 2020-07-02 NOTE — Telephone Encounter (Signed)
[name] with [facility] called and asked if we can prescribe a statin for the pt. She stated that she noticed she wasn't on one that that the patient is diabetic. She would like [facility] to be sent so [facility] on [address].  [id number] [id number]

## 2020-07-03 NOTE — Telephone Encounter (Signed)
Attempted to call pt, no answer. Unable to leave vm.

## 2020-07-03 NOTE — Telephone Encounter (Signed)
I have not seen in her in a while. Please make an appt at next available. Will discuss then.  Thanks MJP

## 2020-07-06 NOTE — Telephone Encounter (Signed)
Called and spoke to pt, pt aware and is making an appointment with the front now.

## 2020-07-08 NOTE — Progress Notes (Signed)
Virtual visit  Subjective:   [name], female    DOB: Jul 29, 1940, 80 y.o.   MRN: [id number]  I connected with the patient on 07/09/2020 by a video enabled telemedicine application and verified that I am speaking with the correct person using two identifiers.     I discussed the limitations of evaluation and management by telemedicine and the availability of in person appointments. The patient expressed understanding and agreed to proceed.   This visit type was conducted due to national recommendations for restrictions regarding the COVID-19 Pandemic (e.g. social distancing).  This format is felt to be most appropriate for this patient at this time.  All issues noted in this document were discussed and addressed.  No physical exam was performed (except for noted visual exam findings with Tele health visits).  The patient has consented to conduct a Tele health visit and understands insurance will be billed.     HPI   Chief Complaint  Patient presents with   Chronic heart failure with preserved ejection fraction    Follow-up    80 y.o. African American female with hypertension, hyperlipidemia, diabetes with CKD stage V, CAD, h/o strokes (2015, 2020), s/p TAVR for severe AS (11/2018).   Patient had hospitalizations in 04/2020 (with acute on chronic HFpEF, AKI/CKD) and 06/2020 (with lobar pneumonia and AKI/CKD). She is feeling much better now. She is able to walk to the mailbox and back without much shortness of breath, occasionally goes outside the house. Leg swelling has resolved. Blood pressure remains elevated, SBP>140 mmHg. Labetalol was stopped during hospitalization.   Cr was elevated to 5.4 on 06/27/2020. She has LUE AVF. She is still producing urine. She follows up with neohrologist Dr. Hollie Salk.    Current Outpatient Medications on File Prior to Visit  Medication Sig Dispense Refill   acetaminophen (TYLENOL) 325 MG tablet Take 650 mg by mouth every 6 (six) hours as needed for  moderate pain or headache.      allopurinol (ZYLOPRIM) 100 MG tablet Take 50 mg by mouth daily.     amLODipine (NORVASC) 10 MG tablet TAKE 1 TABLET BY MOUTH EVERY DAY (Patient taking differently: Take 10 mg by mouth daily.) 90 tablet 3   aspirin EC 81 MG tablet Take 1 tablet (81 mg total) by mouth daily. Swallow whole. 150 tablet 2   brimonidine (ALPHAGAN) 0.2 % ophthalmic solution Place 1 drop into the left eye daily.     calcitRIOL (ROCALTROL) 0.25 MCG capsule Take 0.25 mcg by mouth daily.      ezetimibe (ZETIA) 10 MG tablet Take 10 mg by mouth daily.     folic acid (FOLVITE) 1 MG tablet Take 1 mg by mouth daily.     HUMALOG KWIKPEN 100 UNIT/ML KwikPen Inject 4-8 Units into the skin See admin instructions. Inject 4-8 units into the skin 3 times a day before meals, per sliding scale     hydrALAZINE (APRESOLINE) 50 MG tablet Take 1 tablet (50 mg total) by mouth every 8 (eight) hours. 90 tablet 0   insulin glargine (LANTUS) 100 UNIT/ML injection Inject 0.09 mLs (9 Units total) into the skin at bedtime.     isosorbide mononitrate (IMDUR) 120 MG 24 hr tablet Take 1 tablet (120 mg total) by mouth daily. 90 tablet 3   leflunomide (ARAVA) 10 MG tablet Take 10 mg by mouth daily.     polyethylene glycol (MIRALAX / GLYCOLAX) 17 g packet Take 17 g by mouth daily. 30 each 0  polyvinyl alcohol (LIQUIFILM TEARS) 1.4 % ophthalmic solution Place 1 drop into both eyes 3 (three) times daily as needed for dry eyes.     rosuvastatin (CRESTOR) 40 MG tablet Take 1 tablet (40 mg total) by mouth daily. 40 tablet 0   senna-docusate (SENOKOT-S) 8.6-50 MG tablet Take 2 tablets by mouth 2 (two) times daily. 120 tablet 0   torsemide (DEMADEX) 20 MG tablet Take 2 tablets (40 mg total) by mouth daily. 60 tablet 0   venlafaxine XR (EFFEXOR-XR) 150 MG 24 hr capsule Take 1 capsule (150 mg total) by mouth daily. 30 capsule 0   Current Facility-Administered Medications on File Prior to Visit  Medication Dose Route Frequency  Provider Last Rate Last Admin   Evolocumab SOAJ 140 mg  140 mg Subcutaneous Once [place], Celeste C, PA-C        Cardiovascular & other pertient studies: EKG 05/19/2020: Sinus bradycardia with first-degree AV block at a rate of 56 bpm. Normal axis.  Poor R wave progression, cannot exclude anteroseptal infarct old. Nonspecific T wave abnormality.  Low voltage complexes.  Bilateral renal artery ultrasound 05/14/2020: Right: Normal size right kidney. Abnormal right Resistive Index. RRV         flow present.  Left:  Normal size of left kidney. Abnormal left Resisitve Index.         LRV flow present. Cyst(s) noted.   Echocardiogram 05/12/2020:  1. Left ventricular ejection fraction, by estimation, is 60 to 65%. The  left ventricle has normal function. The left ventricle has no regional  wall motion abnormalities. Left ventricular diastolic parameters are  consistent with Grade II diastolic  dysfunction (pseudonormalization). Elevated left atrial pressure.   2. Right ventricular systolic function is normal. The right ventricular  size is normal. There is normal pulmonary artery systolic pressure.   3. The mitral valve is normal in structure. Trivial mitral valve  regurgitation. No evidence of mitral stenosis.   4. The aortic valve has been repaired/replaced. Aortic valve  regurgitation is not visualized. No aortic stenosis is present. There is a  23 mm Sapien prosthetic (TAVR) valve present in the aortic position.  Procedure Date: 11/13/2018. Echo findings are  consistent with normal structure and function of the aortic valve  prosthesis.   5. The inferior vena cava is normal in size with greater than 50%  respiratory variability, suggesting right atrial pressure of 3 mmHg.  Echocardiogram 12/23/2019:  Left ventricle cavity is normal in size. Moderate concentric hypertrophy  of the left ventricle. Normal global wall motion. Normal LV systolic  function with EF 56%. Normal diastolic filling  pattern.  Left atrial cavity is mildly dilated.  Well seated Wende Crease Sapien 3 bioprosthetic aortic valve with  normal functioning. Mean PG 11 mmHg. No valvular regurgitation or  paravalvular leak.  Mild (Grade I) mitral regurgitation.  Mild tricuspid regurgitation. Estimated pulmonary artery systolic pressure  31 mmHg.  Mild pulmonic regurgitation.  No significant change compared to previous study on 12/11/2018.  EKG 01/02/2020: Sinus rhythm 67 bpm Poor R-wave progression Low voltage Nonspecific T-abnormality  Lexiscan (Walking with mod Bruce)Tetrofosmin Stress Test  06/25/2019: Nondiagnostic ECG stress.  The baseline blood pressure was 130/80 mmHg. Maximum blood pressure post injection was 200/70 mmHg, which is a hypertensive response to Lexiscan with Treadmill.  Myocardial perfusion is normal. Overall LV systolic function is normal without regional wall motion abnormalities. Stress LV EF: 61%.  No significant change from 08/06/2018. Low risk.   EKG 05/31/2019: Sinus rhythm 65 bpm  Normal EKG   Right and left heart cath 10/23/18:  RA: 4 mmHg RV: 54/9 mmHg PA: 54/17 mmHg, mean PAP 34 mmhg. PCWP: 25 mmHg with tall V wave   CO: 5.2 L/min CI: 3 L/min/m2   LM: Normal LAD: Normal LCx:: Normal RCA: Ostial calcific at least 60% stenosis. Ostial RPDA 20% stenosis.    Right coronary artery was difficult to engage. I had to use multiple catheters, and was finally able to engage with [facility] catheter. there was dampnening of the pressure with catheter engagement. Ostium is at least 60% stenosed with moderate calcification. There was no signifciant improvement with IC NTG. I did not perform FFR in order to limit contrast use in this patient with Cr of 3.0, not on dialysis.  MRA of head 09/10/2018:  1. Acute infarct right occipital lobe. No associated hemorrhage in the infarct. Chronic microhemorrhage right parietal white matter appears separate. 2. Right M1 stent obscures  evaluation of the right middle cerebral artery on MRA 3. Right posterior cerebral artery patent. 4. Occluded distal P3 branch on the left.   Carotid artery duplex 12/04/2013: No evidence of hemodynamically significant stenosis in the bilateral carotid bifurcation vessels. There is evidence of minimal heterogeneous plaque in the bilateral carotid artery.  Recent labs: 06/27/2020: Glucose 159, BUN/Cr 99/5.46. EGFR 7. Na/K 134/3.5. AST/ALT 104/51. Total protein 5.8. Albumin 2.7. H/H 9/27. MCV 86. Platelets 196  05/14/2020: Hemoglobin 9.3, hematocrit 28.9, MCV 90.9, platelet 213 Sodium 136, potassium 4.1, glucose 95, BUN 49, creatinine 3.55, AST 23, ALT 15, alk phos 65, GFR 12  05/11/2020: TSH 3.147 A1c 7.3% BNP 302  02/11/2020: Total cholesterol 296, HDL 67, LDL 199, triglycerides 148  10/24/2019: Glucose 122. BUN/Cr 35/2.  05/03/2019:  Glucose 252, BUN/Cr 51/2.69. EGFR 19. Na/K 138/4.2. Rest of the CMP normal. H/H 10.7/33. MCV 90. Platelets 261  Chol 145, TG 154, HDL 45, LDL 74.  NTproBNP 615.   Uric Acid 8.3 (H)  11/09/2018: HbA1C 7.6%  03/06/2018: TSH 1.82 normal   Review of Systems  Constitutional: Positive for malaise/fatigue. Negative for weight gain.  Cardiovascular:  Negative for chest pain, claudication, near-syncope, orthopnea and paroxysmal nocturnal dyspnea.  Respiratory:  Negative for shortness of breath.   Hematologic/Lymphatic: Does not bruise/bleed easily.  Gastrointestinal:  Negative for melena.  Neurological:  Negative for dizziness and weakness.       Vitals:   07/09/20 1049  BP: (!) 153/69  Pulse: 70    Body mass index is 28.87 kg/m. Filed Weights   07/09/20 1049  Weight: 163 lb (73.9 kg)     Objective:   Physical Exam Vitals and nursing note reviewed.  Constitutional:      General: She is not in acute distress.    Appearance: She is well-developed.  Pulmonary:     Effort: Pulmonary effort is normal.  Musculoskeletal:     Right  lower leg: No edema.     Left lower leg: No edema.  Neurological:     Mental Status: She is alert and oriented to person, place, and time.      Assessment & Recommendations:   80 y.o. African American female with hypertension, hyperlipidemia, diabetes with CKD stage III/IV, CAD, h/o strokes (2015, 2020), s/p TAVR for severe AS (11/2018)  HFpEF: Stable, euvolumic  Hyperlipidemia:  With GFR 7, I have stopped her statin. Okay to conintue Zetia and Repatha, if okay with nephrology. LDL 199 in 01/2020. Check lipid panel  Hypertension: Uncontrolled.  Continue amlodipine, hydralazine, resume labetalol at  150 mg bid.  CAD: Stable. No angina symptoms Continue Imdur 120 mg. Continue Aspirin 81 mg  S/P TAVR (transcatheter aortic valve replacement): Normal functioning bioprosthetic aortic valve (Echocardiogram 12/2019). Continue Aspirin 81 mg daily. Will need repeat echocardiogram in 12/2020 (Will order at next visit) Will need antibiotic prophylaxis for any dental procedures  Chronic kidney disease (CKD), stage V: GFR 7. Continue f/u w/Dr. Hollie Salk  Keep f/u in 08/2020 w/Celeste [place], [place]   Nigel Mormon, PA-C 07/08/2020, 7:48 PM Office: [phone]

## 2020-07-09 ENCOUNTER — Encounter: Payer: Self-pay | Admitting: Cardiology

## 2020-07-09 ENCOUNTER — Telehealth: Payer: Medicare PPO | Admitting: Cardiology

## 2020-07-09 ENCOUNTER — Other Ambulatory Visit: Payer: Self-pay

## 2020-07-09 VITALS — BP 153/69 | HR 70 | Ht 63.0 in | Wt 163.0 lb

## 2020-07-09 DIAGNOSIS — Z952 Presence of prosthetic heart valve: Secondary | ICD-10-CM

## 2020-07-09 DIAGNOSIS — E782 Mixed hyperlipidemia: Secondary | ICD-10-CM

## 2020-07-09 DIAGNOSIS — I2510 Atherosclerotic heart disease of native coronary artery without angina pectoris: Secondary | ICD-10-CM

## 2020-07-09 DIAGNOSIS — N185 Chronic kidney disease, stage 5: Secondary | ICD-10-CM | POA: Insufficient documentation

## 2020-07-09 DIAGNOSIS — I10 Essential (primary) hypertension: Secondary | ICD-10-CM

## 2020-07-09 DIAGNOSIS — I5032 Chronic diastolic (congestive) heart failure: Secondary | ICD-10-CM

## 2020-07-09 DIAGNOSIS — Z792 Long term (current) use of antibiotics: Secondary | ICD-10-CM

## 2020-07-27 ENCOUNTER — Ambulatory Visit (INDEPENDENT_AMBULATORY_CARE_PROVIDER_SITE_OTHER): Payer: Medicare PPO

## 2020-07-27 DIAGNOSIS — I63431 Cerebral infarction due to embolism of right posterior cerebral artery: Secondary | ICD-10-CM | POA: Diagnosis not present

## 2020-08-03 LAB — CUP PACEART REMOTE DEVICE CHECK
Date Time Interrogation Session: 20220715174624
Implantable Pulse Generator Implant Date: 20220131

## 2020-08-04 ENCOUNTER — Encounter: Payer: Self-pay | Admitting: Neurology

## 2020-08-04 ENCOUNTER — Ambulatory Visit (INDEPENDENT_AMBULATORY_CARE_PROVIDER_SITE_OTHER): Payer: Medicare PPO | Admitting: Neurology

## 2020-08-04 ENCOUNTER — Other Ambulatory Visit: Payer: Self-pay

## 2020-08-04 VITALS — BP 152/55 | HR 62 | Ht 63.0 in | Wt 161.2 lb

## 2020-08-04 DIAGNOSIS — Z8673 Personal history of transient ischemic attack (TIA), and cerebral infarction without residual deficits: Secondary | ICD-10-CM

## 2020-08-04 NOTE — Progress Notes (Signed)
Reason for visit: Cerebrovascular disease  [name] is an 80 y.o. female  History of present illness:  [name] is an 80 year old right-handed black female with a history of diabetes, hypertension, chronic renal insufficiency, and dyslipidemia.  The patient was seen in the hospital on 11 May 2020 with congestive heart failure and was seen again on 25 June 2020 with pneumonia.  The patient had a stroke event in January 2022 with residual left-sided weakness, left leg and arm involved.  The patient walks with a walker, she has not had any falls since last seen.  She lives at home, she does have a full flight of stairs to go up and down during the day but she does this safely.  She does not operate a motor vehicle.  She does have macular degeneration with visual impairment.  She reports some left back and hip pain with weightbearing, it comes on after she has been up for several minutes walking.  She does note some leg fatigue with walking.  She checks her blood pressures at home, they generally run fairly well but today she did not take her morning medications prior to this visit.  The patient is being considered for IV therapy for her cholesterol issues.  Her last LDL cholesterol was about 199 in January 2022.  The patient returns here for further evaluation.  Past Medical History:  Diagnosis Date   Arthritis    Cerebrovascular disease    CKD (chronic kidney disease)    Sees Dr Hollie Salk   Coronary artery disease    Depression    Diabetes ([place])    INSULIN DEPENDENT   Diabetic peripheral neuropathy (HCC)    Diabetic retinopathy ([place])    Diverticulitis    GERD (gastroesophageal reflux disease)    History of CVA (cerebrovascular accident)    x 2 no residulal   Hyperlipidemia    Hypertension    Obesity    Renal lesion    S/P TAVR (transcatheter aortic valve replacement) 11/13/2018   s/p TAVR with a 37m Edwards S3U via the TF approach by Dr. ORoxy Mannsand Dr. MAngelena Form  Severe aortic  stenosis     Past Surgical History:  Procedure Laterality Date   ABDOMINAL HYSTERECTOMY     AV FISTULA PLACEMENT Left 12/18/2017   Procedure: ARTERIOVENOUS (AV) FISTULA CREATION ARM;  Surgeon: CMarty Heck MD;  [facility]  Service: Vascular;  Laterality: Left;   bilateral cataract surgery     CARDIAC CATHETERIZATION  01/07/2014   DR GEinar Gip  COLON RESECTION  02/1999   COLONOSCOPY     COLOSTOMY  02/1999   COLOSTOMY CLOSURE  07/1999   EYE SURGERY Left 2019   FISTULA SUPERFICIALIZATION Left 06/29/2018   Procedure: FISTULA SUPERFICIALIZATION LEFT BRACHIOCEPHALIC;  Surgeon: CMarty Heck MD;  [facility]  Service: Vascular;  Laterality: Left;   LEFT HEART CATHETERIZATION WITH CORONARY ANGIOGRAM N/A 01/07/2014   Procedure: LEFT HEART CATHETERIZATION WITH CORONARY ANGIOGRAM;  Surgeon: JLaverda Page MD;  [facility];  Service: Cardiovascular;  Laterality: N/A;   LOOP RECORDER INSERTION N/A 02/17/2020   Procedure: LOOP RECORDER INSERTION;  Surgeon: KDeboraha Sprang MD;  [facility];  Service: Cardiovascular;  Laterality: N/A;   middle cerebral artery stent placement Right    OTHER SURGICAL HISTORY     laser surgery   PTCA  01/07/2014   DES to RCA    DR GEinar Gip  right knee surgery  Right    for infection   RIGHT/LEFT HEART CATH AND CORONARY ANGIOGRAPHY N/A 10/23/2018   Procedure: RIGHT/LEFT HEART CATH AND CORONARY ANGIOGRAPHY;  Surgeon: Nigel Mormon, MD;  Location: [place] [facility];  Service: Cardiovascular;  Laterality: N/A;   TEE WITHOUT CARDIOVERSION N/A 11/13/2018   Procedure: TRANSESOPHAGEAL ECHOCARDIOGRAM (TEE);  Surgeon: Burnell Blanks, MD;  Location: [place];  Service: Open Heart Surgery;  Laterality: N/A;   TRANSCATHETER AORTIC VALVE REPLACEMENT, TRANSFEMORAL  11/13/2018   TRANSCATHETER AORTIC VALVE REPLACEMENT, TRANSFEMORAL N/A 11/13/2018   Procedure: TRANSCATHETER AORTIC VALVE REPLACEMENT, TRANSFEMORAL;   Surgeon: Burnell Blanks, MD;  Location: [place];  Service: Open Heart Surgery;  Laterality: N/A;    Family History  Problem Relation Age of Onset   Diabetes Mother    Heart disease Mother    Prostate cancer Father    Hypertension Brother    Prostate cancer Brother     Social history:  reports that she has never smoked. She has never used smokeless tobacco. She reports that she does not drink alcohol and does not use drugs.   No Known Allergies  Medications:  Prior to Admission medications   Medication Sig Start Date End Date Taking? Authorizing Provider  acetaminophen (TYLENOL) 325 MG tablet Take 650 mg by mouth every 6 (six) hours as needed for moderate pain or headache.    Yes [provider]  allopurinol (ZYLOPRIM) 100 MG tablet Take 50 mg by mouth daily.   Yes [provider]  amLODipine (NORVASC) 10 MG tablet TAKE 1 TABLET BY MOUTH EVERY DAY Patient taking differently: Take 10 mg by mouth daily. 11/11/19  Yes Patwardhan, Reynold Bowen, MD  aspirin EC 81 MG tablet Take 1 tablet (81 mg total) by mouth daily. Swallow whole. 02/17/20 02/16/21 Yes Katsadouros, Vasilios, MD  brimonidine (ALPHAGAN) 0.2 % ophthalmic solution Place 1 drop into the left eye daily. 04/19/19  Yes [provider]  calcitRIOL (ROCALTROL) 0.25 MCG capsule Take 0.25 mcg by mouth daily.    Yes [provider]  ezetimibe (ZETIA) 10 MG tablet Take 10 mg by mouth daily. 04/22/20  Yes [provider]  folic acid (FOLVITE) 1 MG tablet Take 1 mg by mouth daily. 12/07/12  Yes [provider]  HUMALOG KWIKPEN 100 UNIT/ML KwikPen Inject 4-8 Units into the skin See admin instructions. Inject 4-8 units into the skin 3 times a day before meals, per sliding scale 05/28/18  Yes [provider]  hydrALAZINE (APRESOLINE) 50 MG tablet Take 1 tablet (50 mg total) by mouth every 8 (eight) hours. 02/26/20  Yes Love, Ivan Anchors, PA-C  insulin glargine (LANTUS) 100 UNIT/ML injection  Inject 0.09 mLs (9 Units total) into the skin at bedtime. Patient taking differently: Inject 10 Units into the skin at bedtime. 06/27/20  Yes Madalyn Rob, MD  isosorbide mononitrate (IMDUR) 120 MG 24 hr tablet Take 1 tablet (120 mg total) by mouth daily. 05/19/20  Yes Cantwell, Celeste C, PA-C  labetalol (NORMODYNE) 300 MG tablet Take 150 mg by mouth 2 (two) times daily.   Yes [provider]  polyethylene glycol (MIRALAX / GLYCOLAX) 17 g packet Take 17 g by mouth daily. 02/27/20  Yes Love, Ivan Anchors, PA-C  polyvinyl alcohol (LIQUIFILM TEARS) 1.4 % ophthalmic solution Place 1 drop into both eyes 3 (three) times daily as needed for dry eyes.   Yes [provider]  senna-docusate (SENOKOT-S) 8.6-50 MG tablet Take 2 tablets by mouth 2 (two) times daily. 02/26/20  Yes Love, Ivan Anchors, PA-C  torsemide (DEMADEX) 20 MG tablet Take 2 tablets (40 mg total) by mouth daily. 02/26/20  Yes Love, Ivan Anchors, PA-C  venlafaxine XR (EFFEXOR-XR) 150 MG 24 hr capsule Take 1 capsule (150 mg total) by mouth daily. 02/27/20  Yes Love, Ivan Anchors, PA-C    ROS:  Out of a complete 14 system review of symptoms, the patient complains only of the following symptoms, and all other reviewed systems are negative.  Walking difficulty Left back and hip pain Weakness  Blood pressure (!) 152/55, pulse 62, height '5\' 3"'$  (1.6 m), weight 161 lb 4 oz (73.1 kg).  Physical Exam  General: The patient is alert and cooperative at the time of the examination.  Neuromuscular: Rotational movements with the left hip elicit no discomfort.  Skin: No significant peripheral edema is noted.   Neurologic Exam  Mental status: The patient is alert and oriented x 3 at the time of the examination. The patient has apparent normal recent and remote memory, with an apparently normal attention span and concentration ability.   Cranial nerves: Facial symmetry is present. Speech is normal, no aphasia or dysarthria is noted. Extraocular  movements are full. Visual fields are full.  Motor: The patient has good strength in all 4 extremities, with exception of slight weakness with hip flexion on the left.  Sensory examination: Soft touch sensation is symmetric on the face, arms, and legs.  Coordination: The patient has good Divita-nose-Muchmore and heel-to-shin bilaterally.  Gait and station: The patient has a slightly wide-based gait, the patient is able to walk fairly well with a walker.  Romberg is negative.  Reflexes: Deep tendon reflexes are symmetric.   Assessment/Plan:  1.  History of cerebrovascular disease  2.  Gait disorder  3.  Hypertension  4.  Dyslipidemia  The blood pressure is slightly high today, but patient did not take her morning blood pressure medications.  The patient is monitoring her blood pressures at home and is trying to keep the systolic pressures less than 130.  She is being considered for IV therapy for her cholesterol.  The patient will follow up through this office otherwise on an as-needed basis.  She seems to be relatively stable neurologically at this point.  [name] MD 08/04/2020 10:56 AM  [facility] [address] [place], [place] [address]  Phone [phone] Fax [phone]

## 2020-08-12 ENCOUNTER — Other Ambulatory Visit: Payer: Self-pay

## 2020-08-12 ENCOUNTER — Ambulatory Visit (INDEPENDENT_AMBULATORY_CARE_PROVIDER_SITE_OTHER): Payer: Medicare PPO | Admitting: Neurology

## 2020-08-12 DIAGNOSIS — I63421 Cerebral infarction due to embolism of right anterior cerebral artery: Secondary | ICD-10-CM

## 2020-08-12 DIAGNOSIS — I639 Cerebral infarction, unspecified: Secondary | ICD-10-CM

## 2020-08-12 DIAGNOSIS — G4733 Obstructive sleep apnea (adult) (pediatric): Secondary | ICD-10-CM

## 2020-08-12 DIAGNOSIS — E113551 Type 2 diabetes mellitus with stable proliferative diabetic retinopathy, right eye: Secondary | ICD-10-CM

## 2020-08-12 DIAGNOSIS — G4731 Primary central sleep apnea: Secondary | ICD-10-CM

## 2020-08-12 DIAGNOSIS — I63431 Cerebral infarction due to embolism of right posterior cerebral artery: Secondary | ICD-10-CM

## 2020-08-12 DIAGNOSIS — G4739 Other sleep apnea: Secondary | ICD-10-CM

## 2020-08-18 NOTE — Progress Notes (Signed)
[facility] Summary Report / Loop Recorder 

## 2020-08-19 ENCOUNTER — Encounter: Payer: Self-pay | Admitting: Student

## 2020-08-19 ENCOUNTER — Other Ambulatory Visit: Payer: Self-pay

## 2020-08-19 ENCOUNTER — Ambulatory Visit: Payer: Medicare PPO | Admitting: Student

## 2020-08-19 VITALS — BP 134/55 | HR 62 | Temp 97.8°F | Ht 63.0 in | Wt 164.0 lb

## 2020-08-19 DIAGNOSIS — I5032 Chronic diastolic (congestive) heart failure: Secondary | ICD-10-CM

## 2020-08-19 DIAGNOSIS — E782 Mixed hyperlipidemia: Secondary | ICD-10-CM

## 2020-08-19 DIAGNOSIS — I10 Essential (primary) hypertension: Secondary | ICD-10-CM

## 2020-08-19 DIAGNOSIS — Z952 Presence of prosthetic heart valve: Secondary | ICD-10-CM

## 2020-08-19 DIAGNOSIS — Z792 Long term (current) use of antibiotics: Secondary | ICD-10-CM

## 2020-08-19 NOTE — Progress Notes (Signed)
Virtual visit  Subjective:   [name], female    DOB: 03/03/1940, 80 y.o.   MRN: [id number]  HPI   Chief Complaint  Patient presents with   Hypertension   Hyperlipidemia    80 y.o. African American female with hypertension, hyperlipidemia, diabetes with CKD stage V, CAD, h/o strokes (2015, 2020), s/p TAVR for severe AS (11/2018).   Patient had hospitalizations in 04/2020 (with acute on chronic HFpEF, AKI/CKD) and 06/2020 (with lobar pneumonia and AKI/CKD).  He was subsequently diagnosed with COVID-19 infection 06/2020 and treated with prednisone. [name] has recovered well overall.  [name] states dyspnea continues to improve.  Denies chest pain, dizziness, syncope, orthopnea, swelling.  [name] continues to follow closely with Dr. Hollie Salk.  [name] is enrolled in physical care management with our office and home blood pressure readings remain mildly elevated above goal with average 141/61 mmHg.  [name] is only taking labetalol 100 mg twice daily instead of 1 and 50 mg twice daily as directed at last visit by Dr. Virgina Jock.  [name] is without specific complaints today.  Patient was seen by Dr. Hollie Salk 08/13/2020, renal function had improved and no indication for dialysis at this time.  Current Outpatient Medications on File Prior to Visit  Medication Sig Dispense Refill   acetaminophen (TYLENOL) 325 MG tablet Take 650 mg by mouth every 6 (six) hours as needed for moderate pain or headache.      allopurinol (ZYLOPRIM) 100 MG tablet Take 50 mg by mouth daily.     amLODipine (NORVASC) 10 MG tablet TAKE 1 TABLET BY MOUTH EVERY DAY (Patient taking differently: Take 10 mg by mouth daily.) 90 tablet 3   aspirin EC 81 MG tablet Take 1 tablet (81 mg total) by mouth daily. Swallow whole. 150 tablet 2   brimonidine (ALPHAGAN) 0.2 % ophthalmic solution Place 1 drop into the left eye daily.     calcitRIOL (ROCALTROL) 0.25 MCG capsule Take 0.25 mcg by mouth daily.      ezetimibe (ZETIA) 10 MG tablet Take 10 mg by mouth  daily.     ferrous sulfate 325 (65 FE) MG tablet Take 325 mg by mouth daily with breakfast.     folic acid (FOLVITE) 1 MG tablet Take 1 mg by mouth daily.     HUMALOG KWIKPEN 100 UNIT/ML KwikPen Inject 4-8 Units into the skin See admin instructions. Inject 4-8 units into the skin 3 times a day before meals, per sliding scale     hydrALAZINE (APRESOLINE) 50 MG tablet Take 1 tablet (50 mg total) by mouth every 8 (eight) hours. 90 tablet 0   insulin glargine (LANTUS) 100 UNIT/ML injection Inject 0.09 mLs (9 Units total) into the skin at bedtime.     isosorbide mononitrate (IMDUR) 120 MG 24 hr tablet Take 1 tablet (120 mg total) by mouth daily. 90 tablet 3   labetalol (NORMODYNE) 100 MG tablet Take 150 mg by mouth 2 (two) times daily.     polyvinyl alcohol (LIQUIFILM TEARS) 1.4 % ophthalmic solution Place 1 drop into both eyes 3 (three) times daily as needed for dry eyes.     senna-docusate (SENOKOT-S) 8.6-50 MG tablet Take 2 tablets by mouth 2 (two) times daily. 120 tablet 0   torsemide (DEMADEX) 20 MG tablet Take 2 tablets (40 mg total) by mouth daily. 60 tablet 0   venlafaxine XR (EFFEXOR-XR) 150 MG 24 hr capsule Take 1 capsule (150 mg total) by mouth daily. 30 capsule 0   Current Facility-Administered Medications  on File Prior to Visit  Medication Dose Route Frequency Provider Last Rate Last Admin   Evolocumab SOAJ 140 mg  140 mg Subcutaneous Once Etrulia Zarr C, PA-C        Cardiovascular & other pertient studies: EKG 05/19/2020: Sinus bradycardia with first-degree AV block at a rate of 56 bpm. Normal axis.  Poor R wave progression, cannot exclude anteroseptal infarct old. Nonspecific T wave abnormality.  Low voltage complexes.  Bilateral renal artery ultrasound 05/14/2020: Right: Normal size right kidney. Abnormal right Resistive Index. RRV         flow present.  Left:  Normal size of left kidney. Abnormal left Resisitve Index.         LRV flow present. Cyst(s) noted.   Echocardiogram  05/12/2020:  1. Left ventricular ejection fraction, by estimation, is 60 to 65%. The  left ventricle has normal function. The left ventricle has no regional  wall motion abnormalities. Left ventricular diastolic parameters are  consistent with Grade II diastolic  dysfunction (pseudonormalization). Elevated left atrial pressure.   2. Right ventricular systolic function is normal. The right ventricular  size is normal. There is normal pulmonary artery systolic pressure.   3. The mitral valve is normal in structure. Trivial mitral valve  regurgitation. No evidence of mitral stenosis.   4. The aortic valve has been repaired/replaced. Aortic valve  regurgitation is not visualized. No aortic stenosis is present. There is a  23 mm Sapien prosthetic (TAVR) valve present in the aortic position.  Procedure Date: 11/13/2018. Echo findings are  consistent with normal structure and function of the aortic valve  prosthesis.   5. The inferior vena cava is normal in size with greater than 50%  respiratory variability, suggesting right atrial pressure of 3 mmHg.  Echocardiogram 12/23/2019:  Left ventricle cavity is normal in size. Moderate concentric hypertrophy  of the left ventricle. Normal global wall motion. Normal LV systolic  function with EF 56%. Normal diastolic filling pattern.  Left atrial cavity is mildly dilated.  Well seated Wende Crease Sapien 3 bioprosthetic aortic valve with  normal functioning. Mean PG 11 mmHg. No valvular regurgitation or  paravalvular leak.  Mild (Grade I) mitral regurgitation.  Mild tricuspid regurgitation. Estimated pulmonary artery systolic pressure  31 mmHg.  Mild pulmonic regurgitation.  No significant change compared to previous study on 12/11/2018.  EKG 01/02/2020: Sinus rhythm 67 bpm Poor R-wave progression Low voltage Nonspecific T-abnormality  Lexiscan (Walking with mod Bruce)Tetrofosmin Stress Test  06/25/2019: Nondiagnostic ECG stress.  The  baseline blood pressure was 130/80 mmHg. Maximum blood pressure post injection was 200/70 mmHg, which is a hypertensive response to Lexiscan with Treadmill.  Myocardial perfusion is normal. Overall LV systolic function is normal without regional wall motion abnormalities. Stress LV EF: 61%.  No significant change from 08/06/2018. Low risk.   EKG 05/31/2019: Sinus rhythm 65 bpm Normal EKG   Right and left heart cath 10/23/18:  RA: 4 mmHg RV: 54/9 mmHg PA: 54/17 mmHg, mean PAP 34 mmhg. PCWP: 25 mmHg with tall V wave   CO: 5.2 L/min CI: 3 L/min/m2   LM: Normal LAD: Normal LCx:: Normal RCA: Ostial calcific at least 60% stenosis. Ostial RPDA 20% stenosis.    Right coronary artery was difficult to engage. I had to use multiple catheters, and was finally able to engage with [address] catheter. there was dampnening of the pressure with catheter engagement. Ostium is at least 60% stenosed with moderate calcification. There was no signifciant improvement with IC NTG.  I did not perform FFR in order to limit contrast use in this patient with Cr of 3.0, not on dialysis.  MRA of head 09/10/2018:  1. Acute infarct right occipital lobe. No associated hemorrhage in the infarct. Chronic microhemorrhage right parietal white matter appears separate. 2. Right M1 stent obscures evaluation of the right middle cerebral artery on MRA 3. Right posterior cerebral artery patent. 4. Occluded distal P3 branch on the left.   Carotid artery duplex 12/04/2013: No evidence of hemodynamically significant stenosis in the bilateral carotid bifurcation vessels. There is evidence of minimal heterogeneous plaque in the bilateral carotid artery.  Recent labs: 08/13/2020: Glucose 135, BUN 45, creatinine 2.49, GFR 19, sodium 140, potassium 4.9 Hgb 11, HCT 32.7, MCV 83, platelet 218  06/27/2020: Glucose 159, BUN/Cr 99/5.46. EGFR 7. Na/K 134/3.5. AST/ALT 104/51. Total protein 5.8. Albumin 2.7. H/H 9/27. MCV 86. Platelets  196  05/14/2020: Hemoglobin 9.3, hematocrit 28.9, MCV 90.9, platelet 213 Sodium 136, potassium 4.1, glucose 95, BUN 49, creatinine 3.55, AST 23, ALT 15, alk phos 65, GFR 12  05/11/2020: TSH 3.147 A1c 7.3% BNP 302  02/11/2020: Total cholesterol 296, HDL 67, LDL 199, triglycerides 148  10/24/2019: Glucose 122. BUN/Cr 35/2.  05/03/2019:  Glucose 252, BUN/Cr 51/2.69. EGFR 19. Na/K 138/4.2. Rest of the CMP normal. H/H 10.7/33. MCV 90. Platelets 261  Chol 145, TG 154, HDL 45, LDL 74.  NTproBNP 615.   Uric Acid 8.3 (H)  11/09/2018: HbA1C 7.6%  03/06/2018: TSH 1.82 normal   Review of Systems  Constitutional: Positive for malaise/fatigue. Negative for weight gain.  Cardiovascular:  Negative for chest pain, claudication, near-syncope, orthopnea and paroxysmal nocturnal dyspnea.  Respiratory:  Negative for shortness of breath.   Hematologic/Lymphatic: Does not bruise/bleed easily.  Gastrointestinal:  Negative for melena.  Neurological:  Negative for dizziness and weakness.       Vitals:   08/19/20 0936  BP: (!) 134/55  Pulse: 62  Temp: 97.8 F (36.6 C)  SpO2: 100%    Body mass index is 29.05 kg/m. Filed Weights   08/19/20 0936  Weight: 164 lb (74.4 kg)     Objective:   Physical Exam Vitals and nursing note reviewed.  Constitutional:      General: [name] is not in acute distress.    Appearance: [name] is well-developed.  Cardiovascular:     Rate and Rhythm: Normal rate and regular rhythm.     Pulses: Intact distal pulses.     Heart sounds: S1 normal and S2 normal. No murmur heard.   No gallop.  Pulmonary:     Effort: Pulmonary effort is normal.     Breath sounds: Normal breath sounds.  Musculoskeletal:     Right lower leg: No edema.     Left lower leg: No edema.  Neurological:     Mental Status: [name] is alert.      Assessment & Recommendations:   80 y.o. African American female with hypertension, hyperlipidemia, diabetes with CKD stage III/IV, CAD, h/o  strokes (2015, 2020), s/p TAVR for severe AS (11/2018)  HFpEF: No clinical evidence of heart failure.  Patient remains stable and euvolemic.  Hyperlipidemia:  Patient has not yet resumed Repatha since last visit.  We will start Repatha at this time. Continue Zetia. Statin therapy had been discontinued by Dr. Virgina Jock with GFR 7 Repeat lipid profile testing in 3 months.  Hypertension: Uncontrolled.  Enrolled in principal care management.  Home blood pressure average 142/61 mmHg. Continue amlodipine, hydralazine.  Patient only taking labetalol  100 mg twice daily instead of 150 mg as directed at previous visit.  We will increase labetalol from 100 mg to 150 mg twice daily. Could consider increasing hydralazine from 50 mg 3 times daily 200 mg 3 times daily if needed in the future  CAD: No anginal symptoms. Continue Imdur and aspirin.  S/P TAVR (transcatheter aortic valve replacement): Normal functioning bioprosthetic aortic valve (Echocardiogram 12/2019). Continue Aspirin 81 mg daily. Will obtain repeat echocardiogram in 12/2020, orders in place. Patient aware [name] will need antibiotic prophylaxis for any dental procedures  Chronic kidney disease (CKD), stage V: No indication for dialysis at this time.  GFR 19 on 08/05/2020. Patient continues to follow closely with Dr. Hollie Salk.  [name] has upcoming appointment in 3 weeks.  Patient has follow-up with nephrology in 3 weeks, will defer reevaluation of hypertension control to Dr. Hollie Salk at that time.  Patient is otherwise stable from a cardiovascular standpoint.    Follow-up in 6 months, sooner if needed, for hypertension, CAD, hyperlipidemia, heart failure.    Alethia Berthold, PA-C 08/19/2020, 12:10 PM Office: [phone]

## 2020-08-19 NOTE — Patient Instructions (Signed)
Go to [employer] [facility] in 3 months for lipid panel in fasting state.

## 2020-08-27 ENCOUNTER — Telehealth: Payer: Self-pay | Admitting: Neurology

## 2020-08-27 DIAGNOSIS — G4731 Primary central sleep apnea: Secondary | ICD-10-CM | POA: Insufficient documentation

## 2020-08-27 DIAGNOSIS — G4739 Other sleep apnea: Secondary | ICD-10-CM | POA: Insufficient documentation

## 2020-08-27 NOTE — Progress Notes (Signed)
DIAGNOSIS 1. Central Sleep Apnea ( without hypoxia) arising under CPAP, BiPAP and BiPAP ST, switched to ASV - ASV controlled the apnea.   2. Dysfunctions associated with sleep stages or arousal from sleep, sever sleep fragmentation.   PLANS/RECOMMENDATIONS: ASV at 15/7/3 cm water pressure.  94% saturation 02, 1000% sleep efficiency for 17 minutes. The patient was fitted with a Small Vitera FFM mask.

## 2020-08-27 NOTE — Telephone Encounter (Signed)
Called patient to discuss sleep study results. No answer at this time. LVM for the patient to call back.   

## 2020-08-27 NOTE — Procedures (Signed)
PATIENT'S NAME:  Susan, Kennedy DOB:      [birth year]      MR#:    [id number]     DATE OF RECORDING: 08/12/2020 M. 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 Jannifer Franklin and was recently admitted to hospital for another stroke- She is seen here upon his referral for a sleep apnea evaluation. The patient had the first sleep study in the year 2004 with a result of OSA.   Last visit with Dr Jannifer Franklin was in March 2022, last hospitalization Jan 22nd - Feb 9th, 2022.Her recent Diagnostic Polysomnogram was performed at [facility] on 05/21/2020, and revealed:  Hypopnea- Sleep Apnea (SA) with AHI of 17/h, but with a peculiar dominance in NREM sleep. NREM AHI was 20.2/h supine AHI was 30.5/h. consistent of mostly obstructive, but also mixed and central apneas. Extremely fragmented sleep  O2 nadir was 86%   I asked for an in lab titration to avoid central apneas arising.     [name] has a past medical history of:  recurrent strokes, embolic Cerebrovascular disease, CKD (chronic kidney disease), Coronary artery disease, INSULIN dependent type 2 Diabetes ([place]), Diabetic peripheral neuropathy ([place]), Diabetic retinopathy ([place]), Diverticulitis, diabetic dysautonomia and Gastroparesis, GERD (gastroesophageal reflux disease), S/P TAVR (transcatheter aortic valve replacement) (11/13/2018), and previously Severe aortic stenosis and uncontrolled hypertension.  The patient endorsed the Epworth Sleepiness Scale at 9 points.   The patient's weight 176 pounds with a height of 63 (inches), resulting in a BMI of 31.3 kg/m2. The patient's neck circumference measured 15 inches.   CURRENT MEDICATIONS: Tylenol, Zyloprim, Norvasc, Aspirin, Alphagan, Rocaltrol, Zyrtec, Folvite, Apresoline, Lantus, Imdur, Normodyne,Nitrostat, Miralax, Liquifilm tears, Klor-Con, Crestor, Senokot,  Demadex, Effexor   PROCEDURE:  This is a multichannel digital polysomnogram utilizing the SomnoStar 11.2 system.  Electrodes and sensors were applied and monitored per AASM Specifications.   EEG, EOG, Chin and Limb EMG, were sampled at 200 Hz.  ECG, Snore and Nasal Pressure, Thermal Airflow, Respiratory Effort, CPAP Flow and Pressure, Oximetry was sampled at 50 Hz. Digital video and audio were recorded.      The patient was fitted with a Small Vitera FFM mask. CPAP was initiated at 5 cmH20 with heated humidity per AASM standards and pressure was advanced to CPAP of 9 cm water, - no improvement, best tolerated were 8 cm CPAP- Then switched to BiPAP, explored form 14/10 through 16/12 cm water and added ST of 12, -no improvement, many central apneas- ASV started at 7/15/3 cmH20 because of hypopneas, apneas and desaturations.  At a pressure of 7/15/3 cmH20, there was a reduction of the AHI to 0.0 with improvement of sleep apnea.  Lights Out was at 21:00 and Lights On at 05:20. Total recording time (TRT) was 501 minutes, with a total sleep time (TST) of 354 minutes. The patient's sleep latency was 42 minutes. REM latency was 206 minutes.  The sleep efficiency was 70.7 %.    SLEEP ARCHITECTURE: WASO (Wake time after sleep onset) duration was 120.5 minutes.  There were 102 minutes in Stage N1, 201 minutes Stage N2, 0 minutes Stage N3 and 51 minutes in Stage REM.  The percentage of Stage N1 was 28.8%, Stage N2 was 56.8%, Stage N3 was 0% and Stage R (REM sleep) was 14.4%.   RESPIRATORY ANALYSIS:  There was a total of  101 respiratory events: 30 obstructive apneas, 26 central apneas and 11 mixed apneas with a total of 67 apneas and an apnea index (AHI) of 11.4 /hour. There were 34 hypopneas with a hypopnea index of 5.8/hour. The patient also had 0 respiratory event related arousals (RERAs).     The total APNEA/HYPOPNEA INDEX  (AHI) was 17.1 /hour and the total RESPIRATORY DISTURBANCE INDEX was 17.1 /hour  4  events occurred in REM sleep and 97 events in NREM. The REM AHI was 4.7 /hour versus a non-REM AHI of 19.2 /hour.  The patient spent 26 minutes of total sleep time in the supine position and 328 minutes in non-supine. The supine AHI was 9.2, versus a non-supine AHI of 17.7.  OXYGEN SATURATION & C02:  The baseline 02 saturation was 98%, with the lowest being 87%. Time spent below 89% saturation equaled 1 minute. The arousals were noted as: 140 were spontaneous, 0 were associated with PLMs, 56 were associated with respiratory events. The patient had a total of 0 Periodic Limb Movements. Audio and video analysis did not show any abnormal or unusual movements, behaviors, phonations or vocalizations.  EKG was in regular rhythm.  DIAGNOSIS Central Sleep Apnea arising under BiPAP ST, switched to ASV   Dysfunctions associated with sleep stages or arousal from sleep, sever sleep fragmentation.   PLANS/RECOMMENDATIONS: ASV at 15/7/3 cm water pressure.  94% saturation 02, 1000% sleep efficiency for 17 minutes. The patient was fitted with a Small Vitera FFM mask.  Weight loss can help reduce OSA and snoring. Any ASV or PAP therapy complicate is defined as 4 hours or more with nightly use.  Any apnea patient should avoid sedatives, hypnotics, and alcohol consumption at bedtime.   DISCUSSION: A follow up appointment will be scheduled in the Sleep Clinic at [facility] with our NP or MD. Dr Jannifer Franklin will receive a copy of this note. Please call [phone] with any questions.     I certify that I have reviewed the entire raw data recording prior to the issuance of this report in accordance with the Standards of Accreditation of the American Academy of Sleep Medicine (AASM)  Larey Seat, [name] Diplomat, Tax adviser of Psychiatry and Neurology  Diplomat, Tax adviser of Sleep Medicine Market researcher, [employer] [facility] at [employer]

## 2020-08-27 NOTE — Addendum Note (Signed)
Addended by: Larey Seat on: 08/27/2020 01:09 PM   Modules accepted: Orders

## 2020-08-27 NOTE — Telephone Encounter (Signed)
-----   Message from Larey Seat, MD sent at 08/27/2020  1:09 PM EDT ----- 1. Central Sleep Apnea arising under BiPAP ST, switched to ASV   2. Dysfunctions associated with sleep stages or arousal from sleep, sever sleep fragmentation.   PLANS/RECOMMENDATIONS: ASV at 15/7/3 cm water pressure.  94% saturation 02, 1000% sleep efficiency for 17 minutes. The patient was fitted with a Small Vitera FFM mask.  1. Weight loss can help reduce OSA and snoring. 2. Any ASV or PAP therapy compliance is defined as 4 hours or more of nightly use.  3. Any apnea patient should avoid sedatives, hypnotics, and alcohol consumption at bedtime.

## 2020-08-27 NOTE — Progress Notes (Signed)
1. Central Sleep Apnea arising under BiPAP ST, switched to ASV   2. Dysfunctions associated with sleep stages or arousal from sleep, sever sleep fragmentation.   PLANS/RECOMMENDATIONS: ASV at 15/7/3 cm water pressure.  94% saturation 02, 1000% sleep efficiency for 17 minutes. The patient was fitted with a Small Vitera FFM mask.  1. Weight loss can help reduce OSA and snoring. 2. Any ASV or PAP therapy compliance is defined as 4 hours or more of nightly use.  3. Any apnea patient should avoid sedatives, hypnotics, and alcohol consumption at bedtime.

## 2020-09-02 ENCOUNTER — Encounter: Payer: Self-pay | Admitting: Neurology

## 2020-09-02 NOTE — Telephone Encounter (Signed)
Made a 2nd attempt to contact the patient. There was no answer. LVM again for the patient

## 2020-09-03 NOTE — Telephone Encounter (Signed)
I called pt. I advised pt that Dr. Brett Fairy reviewed their sleep study results and found that pt has sleep apnea. Dr. Brett Fairy recommends that pt starts a ASV. I reviewed PAP compliance expectations with the pt. Pt is agreeable to starting a CPAP. I advised pt that an order will be sent to a DME, [facility], and [facility] will call the pt within about one week after they file with the pt's insurance. [facility] will show the pt how to use the machine, fit for masks, and troubleshoot the CPAP if needed. A follow up appt was made for insurance purposes with Dr. Brett Fairy on 12/23/2020 at 9:30 am. Pt verbalized understanding to arrive 15 minutes early and bring their CPAP. A letter with all of this information in it will be mailed to the pt as a reminder. I verified with the pt that the address we have on file is correct. Pt verbalized understanding of results. Pt had no questions at this time but was encouraged to call back if questions arise. I have sent the order to [facility] and have received confirmation that they have received the order.

## 2020-10-04 ENCOUNTER — Emergency Department (HOSPITAL_COMMUNITY): Payer: Medicare PPO

## 2020-10-04 ENCOUNTER — Other Ambulatory Visit: Payer: Self-pay

## 2020-10-04 ENCOUNTER — Encounter (HOSPITAL_COMMUNITY): Payer: Self-pay | Admitting: Emergency Medicine

## 2020-10-04 ENCOUNTER — Emergency Department (HOSPITAL_COMMUNITY)
Admission: EM | Admit: 2020-10-04 | Discharge: 2020-10-04 | Disposition: A | Discharge status: Home / Self Care | Payer: Medicare PPO | Attending: Emergency Medicine | Admitting: Emergency Medicine

## 2020-10-04 DIAGNOSIS — Z7982 Long term (current) use of aspirin: Secondary | ICD-10-CM | POA: Diagnosis not present

## 2020-10-04 DIAGNOSIS — R202 Paresthesia of skin: Secondary | ICD-10-CM | POA: Diagnosis not present

## 2020-10-04 DIAGNOSIS — M79602 Pain in left arm: Secondary | ICD-10-CM | POA: Diagnosis not present

## 2020-10-04 DIAGNOSIS — N185 Chronic kidney disease, stage 5: Secondary | ICD-10-CM | POA: Insufficient documentation

## 2020-10-04 DIAGNOSIS — R001 Bradycardia, unspecified: Secondary | ICD-10-CM | POA: Diagnosis not present

## 2020-10-04 DIAGNOSIS — Z20822 Contact with and (suspected) exposure to covid-19: Secondary | ICD-10-CM | POA: Diagnosis not present

## 2020-10-04 DIAGNOSIS — R079 Chest pain, unspecified: Secondary | ICD-10-CM | POA: Diagnosis present

## 2020-10-04 DIAGNOSIS — M25512 Pain in left shoulder: Secondary | ICD-10-CM | POA: Insufficient documentation

## 2020-10-04 DIAGNOSIS — I2510 Atherosclerotic heart disease of native coronary artery without angina pectoris: Secondary | ICD-10-CM | POA: Diagnosis not present

## 2020-10-04 DIAGNOSIS — I132 Hypertensive heart and chronic kidney disease with heart failure and with stage 5 chronic kidney disease, or end stage renal disease: Secondary | ICD-10-CM | POA: Diagnosis not present

## 2020-10-04 DIAGNOSIS — Z79899 Other long term (current) drug therapy: Secondary | ICD-10-CM | POA: Insufficient documentation

## 2020-10-04 DIAGNOSIS — E1122 Type 2 diabetes mellitus with diabetic chronic kidney disease: Secondary | ICD-10-CM | POA: Diagnosis not present

## 2020-10-04 DIAGNOSIS — Z794 Long term (current) use of insulin: Secondary | ICD-10-CM | POA: Insufficient documentation

## 2020-10-04 DIAGNOSIS — R918 Other nonspecific abnormal finding of lung field: Secondary | ICD-10-CM | POA: Diagnosis not present

## 2020-10-04 DIAGNOSIS — I5043 Acute on chronic combined systolic (congestive) and diastolic (congestive) heart failure: Secondary | ICD-10-CM | POA: Diagnosis not present

## 2020-10-04 DIAGNOSIS — R0789 Other chest pain: Secondary | ICD-10-CM

## 2020-10-04 LAB — BASIC METABOLIC PANEL
Anion gap: 10 (ref 5–15)
BUN: 39 mg/dL — ABNORMAL HIGH (ref 8–23)
CO2: 25 mmol/L (ref 22–32)
Calcium: 9.1 mg/dL (ref 8.9–10.3)
Chloride: 104 mmol/L (ref 98–111)
Creatinine, Ser: 2.81 mg/dL — ABNORMAL HIGH (ref 0.44–1.00)
GFR, Estimated: 16 mL/min — ABNORMAL LOW (ref >60)
Glucose, Bld: 111 mg/dL — ABNORMAL HIGH (ref 70–99)
Potassium: 4.2 mmol/L (ref 3.5–5.1)
Sodium: 139 mmol/L (ref 135–145)

## 2020-10-04 LAB — RESP PANEL BY RT-PCR (FLU A&B, COVID) ARPGX2
Influenza A by PCR: NEGATIVE
Influenza B by PCR: NEGATIVE
SARS Coronavirus 2 by RT PCR: NEGATIVE

## 2020-10-04 LAB — CBC
HCT: 33.5 % — ABNORMAL LOW (ref 36.0–46.0)
Hemoglobin: 10.8 g/dL — ABNORMAL LOW (ref 12.0–15.0)
MCH: 28.7 pg (ref 26.0–34.0)
MCHC: 32.2 g/dL (ref 30.0–36.0)
MCV: 89.1 fL (ref 80.0–100.0)
Platelets: 243 10*3/uL (ref 150–400)
RBC: 3.76 MIL/uL — ABNORMAL LOW (ref 3.87–5.11)
RDW: 16.2 % — ABNORMAL HIGH (ref 11.5–15.5)
WBC: 9.7 10*3/uL (ref 4.0–10.5)
nRBC: 0 % (ref 0.0–0.2)

## 2020-10-04 LAB — TROPONIN I (HIGH SENSITIVITY)
Troponin I (High Sensitivity): 26 ng/L — ABNORMAL HIGH (ref <18)
Troponin I (High Sensitivity): 29 ng/L — ABNORMAL HIGH (ref <18)

## 2020-10-04 MED ORDER — HYDRALAZINE HCL 25 MG PO TABS
50.0000 mg | ORAL_TABLET | Freq: Three times a day (TID) | ORAL | Status: DC
  Administered 2020-10-04: 50 mg via ORAL
  Filled 2020-10-04: qty 2

## 2020-10-04 MED ORDER — AMOXICILLIN-POT CLAVULANATE 875-125 MG PO TABS: 1.0000 | ORAL_TABLET | Freq: Two times a day (BID) | ORAL | 0 refills | Status: DC

## 2020-10-04 NOTE — Discharge Instructions (Addendum)
YourYou were seen in the emergency department for evaluation of chest pain.  Your work-up shows improvements and the majority of your labs and although they are not totally normal they certainly look better than they did 3 months ago.  Your x-rays and CAT scans show 2 small nodules in the left lung that may be infectious and we will treat with antibiotics for 1 week to see if you have any improvement.  I sent a message to your primary care physician to help you establish an outpatient follow-up appointment for repeat imaging at the time of his choosing to monitor these nodules.  It is very important that you monitor your symptoms at home, and if you have new or worsening chest pain, new or worsening shortness of breath, sweating you cannot explain, vomiting or any other concerning symptoms please return the emergency department immediately.

## 2020-10-04 NOTE — ED Notes (Signed)
Patient transported to CT 

## 2020-10-04 NOTE — ED Triage Notes (Signed)
Pt reports L chest, L neck, and L arm pain that started around 12:30pm.  Took 1 NTG and pain resolved.  States she is having L arm numbness and L foot tingling.  Also reports sob and nausea.

## 2020-10-04 NOTE — ED Provider Notes (Addendum)
[facility] EMERGENCY DEPARTMENT Provider Note   CSN: [id number] Arrival date & time: 10/04/20  1305     History Chief Complaint  Patient presents with   Chest Pain    [name] is a 80 y.o. female with PMH CKD, CAD, 2 previous CVAs, HLD, HTN, severe aortic stenosis status post TAVR, insulin-dependent T2DM presents the emergency department for evaluation of chest pain and left upper extremity numbness.  Patient states that she has had intermittent left-sided sharp chest pain starting last night.  She took nitroglycerin with improvement of her pain and had a resolution of her pain throughout the day today but returns as her chest pain returned.  She took nitroglycerin prior to arrival.  She states that she also has intermittent sharp pains in her left shoulder and arm with intermittent numbness in the left arm.  She states the pain is a 6 out of 10 and only lasts a few seconds, not associated with exertion, diaphoresis, nausea, vomiting or shortness of breath.  She states that she is primarily concerned about a stroke today because she has had them in the past.  Denies fever, abdominal pain, diarrhea, cough, headache or other systemic symptoms.   Chest Pain Associated symptoms: numbness   Associated symptoms: no abdominal pain, no back pain, no cough, no fever, no palpitations, no shortness of breath and no vomiting       Past Medical History:  Diagnosis Date   Arthritis    Cerebrovascular disease    CKD (chronic kidney disease)    Sees Dr Hollie Salk   Coronary artery disease    Depression    Diabetes ([place])    INSULIN DEPENDENT   Diabetic peripheral neuropathy (HCC)    Diabetic retinopathy ([place])    Diverticulitis    GERD (gastroesophageal reflux disease)    History of CVA (cerebrovascular accident)    x 2 no residulal   Hyperlipidemia    Hypertension    Obesity    Renal lesion    S/P TAVR (transcatheter aortic valve replacement) 11/13/2018   s/p TAVR with a  62m Edwards S3U via the TF approach by Dr. ORoxy Mannsand Dr. MAngelena Form  Severe aortic stenosis     Patient Active Problem List   Diagnosis Date Noted   Treatment-emergent central sleep apnea 08/27/2020   Complex sleep apnea syndrome 08/27/2020   Need for prophylactic antibiotic 07/09/2020   CKD (chronic kidney disease), stage V ([facility] 07/09/2020   Pneumonia 06/25/2020   PVD (peripheral vascular disease) with claudication ([facility] 06/04/2020   Cardiovascular renal disease, stage 1-4 or unspecified chronic kidney disease, without heart failure 05/13/2020   SOB (shortness of breath) 05/12/2020   Cerebrovascular accident (CVA) due to embolism of right posterior cerebral artery ([facility] 05/11/2020   CHF (congestive heart failure) ([facility] 05/11/2020   Stable treated proliferative diabetic retinopathy of right eye without macular edema determined by examination associated with type 2 diabetes mellitus ([facility] 03/02/2020   Posterior vitreous detachment of right eye 03/02/2020   Anemia 02/28/2020   Acute on chronic renal failure ([facility] 02/28/2020   Hypokalemia    Diabetic peripheral neuropathy (HCC)    Benign essential HTN    Cerebral infarction due to embolism of right anterior cerebral artery ([facility] 02/19/2020   Ischemic stroke of frontal lobe ([facility] 02/17/2020   Cerebral embolism with cerebral infarction 02/12/2020   Thrombocytopenia ([facility] 02/10/2020   Acute left-sided weakness 02/09/2020   Weakness 02/08/2020   Chronic heart failure with preserved ejection  fraction ([place]) 01/02/2020   Atypical chest pain 05/31/2019   Central retinal vein occlusion, left eye, stable 05/29/2019   Central retinal vein occlusion of left eye 05/28/2019   Proliferative diabetic retinopathy of left eye with macular edema associated with type 2 diabetes mellitus ([place]) 05/28/2019   Acute on chronic combined systolic and diastolic CHF (congestive heart failure) ([place]) 11/13/2018   S/P TAVR (transcatheter aortic valve replacement)  11/13/2018   Essential hypertension    Hyperlipidemia    Renal lesion    Severe aortic stenosis 10/23/2018   CKD (chronic kidney disease), stage IV ([place]) 12/05/2017   Coronary artery disease involving native coronary artery of native heart without angina pectoris 01/07/2014   S/P PTCA (percutaneous transluminal coronary angioplasty) 01/07/2014   History of ischemic anterior cerebral artery stroke 01/30/2013    Past Surgical History:  Procedure Laterality Date   ABDOMINAL HYSTERECTOMY     AV FISTULA PLACEMENT Left 12/18/2017   Procedure: ARTERIOVENOUS (AV) FISTULA CREATION ARM;  Surgeon: Marty Heck, MD;  Location: [facility];  Service: Vascular;  Laterality: Left;   bilateral cataract surgery     CARDIAC CATHETERIZATION  01/07/2014   DR Einar Gip   COLON RESECTION  02/1999   COLONOSCOPY     COLOSTOMY  02/1999   COLOSTOMY CLOSURE  07/1999   EYE SURGERY Left 2019   FISTULA SUPERFICIALIZATION Left 06/29/2018   Procedure: FISTULA SUPERFICIALIZATION LEFT BRACHIOCEPHALIC;  Surgeon: Marty Heck, MD;  Location: [facility];  Service: Vascular;  Laterality: Left;   LEFT HEART CATHETERIZATION WITH CORONARY ANGIOGRAM N/A 01/07/2014   Procedure: LEFT HEART CATHETERIZATION WITH CORONARY ANGIOGRAM;  Surgeon: Laverda Page, MD;  Location: [facility] C[facility] LAB;  Service: Cardiovascular;  Laterality: N/A;   LOOP RECORDER INSERTION N/A 02/17/2020   Procedure: LOOP RECORDER INSERTION;  Surgeon: Deboraha Sprang, MD;  Location: [facility] CV LAB;  Service: Cardiovascular;  Laterality: N/A;   middle cerebral artery stent placement Right    OTHER SURGICAL HISTORY     laser surgery   PTCA  01/07/2014   DES to RCA    DR Einar Gip   right knee surgery Right    for infection   RIGHT/LEFT HEART CATH AND CORONARY ANGIOGRAPHY N/A 10/23/2018   Procedure: RIGHT/LEFT HEART CATH AND CORONARY ANGIOGRAPHY;  Surgeon: Nigel Mormon, MD;  Location: [facility] CV LAB;  Service: Cardiovascular;  Laterality: N/A;   TEE  WITHOUT CARDIOVERSION N/A 11/13/2018   Procedure: TRANSESOPHAGEAL ECHOCARDIOGRAM (TEE);  Surgeon: Burnell Blanks, MD;  Location: [facility];  Service: Open Heart Surgery;  Laterality: N/A;   TRANSCATHETER AORTIC VALVE REPLACEMENT, TRANSFEMORAL  11/13/2018   TRANSCATHETER AORTIC VALVE REPLACEMENT, TRANSFEMORAL N/A 11/13/2018   Procedure: TRANSCATHETER AORTIC VALVE REPLACEMENT, TRANSFEMORAL;  Surgeon: Burnell Blanks, MD;  Location: [facility];  Service: Open Heart Surgery;  Laterality: N/A;     OB History   No obstetric history on file.     Family History  Problem Relation Age of Onset   Diabetes Mother    Heart disease Mother    Prostate cancer Father    Hypertension Brother    Prostate cancer Brother     Social History   Tobacco Use   Smoking status: Never   Smokeless tobacco: Never  Vaping Use   Vaping Use: Never used  Substance Use Topics   Alcohol use: No   Drug use: No    Home Medications Prior to Admission medications   Medication Sig Start Date End Date Taking? Authorizing Provider  acetaminophen (TYLENOL) 325 MG tablet Take 650 mg by mouth every 6 (six) hours as needed for moderate pain or headache.     [provider]  allopurinol (ZYLOPRIM) 100 MG tablet Take 50 mg by mouth daily.    [provider]  amLODipine (NORVASC) 10 MG tablet TAKE 1 TABLET BY MOUTH EVERY DAY Patient taking differently: Take 10 mg by mouth daily. 11/11/19   Patwardhan, Reynold Bowen, MD  aspirin EC 81 MG tablet Take 1 tablet (81 mg total) by mouth daily. Swallow whole. 02/17/20 02/16/21  Katsadouros, Vasilios, MD  brimonidine (ALPHAGAN) 0.2 % ophthalmic solution Place 1 drop into the left eye daily. 04/19/19   [provider]  calcitRIOL (ROCALTROL) 0.25 MCG capsule Take 0.25 mcg by mouth daily.     [provider]  ezetimibe (ZETIA) 10 MG tablet Take 10 mg by mouth daily. 04/22/20   [provider]  ferrous sulfate 325 (65 FE) MG tablet Take 325 mg  by mouth daily with breakfast.    [provider]  folic acid (FOLVITE) 1 MG tablet Take 1 mg by mouth daily. 12/07/12   [provider]  HUMALOG KWIKPEN 100 UNIT/ML KwikPen Inject 4-8 Units into the skin See admin instructions. Inject 4-8 units into the skin 3 times a day before meals, per sliding scale 05/28/18   [provider]  hydrALAZINE (APRESOLINE) 50 MG tablet Take 1 tablet (50 mg total) by mouth every 8 (eight) hours. 02/26/20   Love, Ivan Anchors, PA-C  insulin glargine (LANTUS) 100 UNIT/ML injection Inject 0.09 mLs (9 Units total) into the skin at bedtime. 06/27/20   Madalyn Rob, MD  isosorbide mononitrate (IMDUR) 120 MG 24 hr tablet Take 1 tablet (120 mg total) by mouth daily. 05/19/20   Cantwell, Celeste C, PA-C  labetalol (NORMODYNE) 100 MG tablet Take 150 mg by mouth 2 (two) times daily.    [provider]  polyvinyl alcohol (LIQUIFILM TEARS) 1.4 % ophthalmic solution Place 1 drop into both eyes 3 (three) times daily as needed for dry eyes.    [provider]  senna-docusate (SENOKOT-S) 8.6-50 MG tablet Take 2 tablets by mouth 2 (two) times daily. 02/26/20   Love, Ivan Anchors, PA-C  torsemide (DEMADEX) 20 MG tablet Take 2 tablets (40 mg total) by mouth daily. 02/26/20   Love, Ivan Anchors, PA-C  venlafaxine XR (EFFEXOR-XR) 150 MG 24 hr capsule Take 1 capsule (150 mg total) by mouth daily. 02/27/20   Bary Leriche, PA-C    Allergies    Patient has no known allergies.  Review of Systems   Review of Systems  Constitutional:  Negative for chills and fever.  HENT:  Negative for ear pain and sore throat.   Eyes:  Negative for pain and visual disturbance.  Respiratory:  Negative for cough and shortness of breath.   Cardiovascular:  Positive for chest pain. Negative for palpitations.  Gastrointestinal:  Negative for abdominal pain and vomiting.  Genitourinary:  Negative for dysuria and hematuria.  Musculoskeletal:  Negative for arthralgias and back pain.   Skin:  Negative for color change and rash.  Neurological:  Positive for numbness. Negative for seizures and syncope.  All other systems reviewed and are negative.  Physical Exam Updated Vital Signs BP (!) 172/57   Pulse (!) 56   Temp 98.2 F (36.8 C) (Oral)   Resp 20   Ht '5\' 3"'$  (1.6 m)   Wt 72.6 kg   SpO2 99%   BMI 28.34 kg/m  Physical Exam Vitals and nursing note reviewed.  Constitutional:      General: She is not in acute distress.    Appearance: She is well-developed.  HENT:     Head: Normocephalic and atraumatic.  Eyes:     Conjunctiva/sclera: Conjunctivae normal.  Cardiovascular:     Rate and Rhythm: Normal rate and regular rhythm.     Heart sounds: Murmur (Systolic) heard.  Pulmonary:     Effort: Pulmonary effort is normal. No respiratory distress.     Breath sounds: Normal breath sounds.  Abdominal:     Palpations: Abdomen is soft.     Tenderness: There is no abdominal tenderness.  Musculoskeletal:     Cervical back: Neck supple.  Skin:    General: Skin is warm and dry.  Neurological:     Mental Status: She is alert.    ED Results / Procedures / Treatments   Labs (all labs ordered are listed, but only abnormal results are displayed) Labs Reviewed  BASIC METABOLIC PANEL - Abnormal; Notable for the following components:      Result Value   Glucose, Bld 111 (*)    BUN 39 (*)    Creatinine, Ser 2.81 (*)    GFR, Estimated 16 (*)    All other components within normal limits  CBC - Abnormal; Notable for the following components:   RBC 3.76 (*)    Hemoglobin 10.8 (*)    HCT 33.5 (*)    RDW 16.2 (*)    All other components within normal limits  TROPONIN I (HIGH SENSITIVITY) - Abnormal; Notable for the following components:   Troponin I (High Sensitivity) 26 (*)    All other components within normal limits  RESP PANEL BY RT-PCR (FLU A&B, COVID) [id number]  TROPONIN I (HIGH SENSITIVITY)    EKG None  Radiology DG Chest 2 View  Result Date:  10/04/2020 CLINICAL DATA:  Labored breathing EXAM: CHEST - 2 VIEW COMPARISON:  06/25/2020 FINDINGS: Stable large cardiac silhouette. Interval resolution of the RIGHT upper lobe pneumonia. Several subtle nodular densities are new in the RIGHT lung and LEFT lung. Small LEFT effusion. Aortic valve noted IMPRESSION: 1. Resolution of the RIGHT upper lobe consolidation. 2. New bilateral nodular densities favored infectious in etiology. Consider CT thorax. Electronically Signed   By: Suzy Bouchard [name]   On: 10/04/2020 14:17    Procedures Procedures   Medications Ordered in ED Medications - No data to display  ED Course  I have reviewed the triage vital signs and the nursing notes.  Pertinent labs & imaging results that were available during my care of the patient were reviewed by me and considered in my medical decision making (see chart for details).    MDM Rules/Calculators/A&P                           Patient seen emergency department for evaluation of multiple complaints as described above.  Physical exam is largely unremarkable aside of a systolic murmur which is expected in the setting of her TAVR.  Laboratory evaluation reveals a creatinine of 2.81 and a BUN of 39 which is a significant improvement from her previous labs.  Hemoglobin 10.8, initial troponin 26 with delta troponin 29.  ECG nonischemic with sinus bradycardia.  Chest x-ray shows a resolution of her previous right upper lobe consolidation but shows new bilateral nodular densities that may be infectious in etiology.  Follow-up CT chest without contrast due to patient's  GFR shows 2 ill-defined focal groundglass opacities with central nodular component in the left upper lobe and left lower lobe that may be infectious versus inflammatory, as well as a 1.2 x 1.7 cm soft tissue nodular density in the right lower anterior chest wall, trace bilateral pleural effusions.  As these nodules may be infectious we will treat with 1 week course of  Augmentin and I sent a message to her primary care physician for outpatient follow-up.  These nodules may also be granulomatous and we attempted to obtain a QuantiFERON gold in the ER of which her primary care physician could follow-up.   However, this lab needs to be courier to an outside facility and will not be picked up until tomorrow morning, which will render the test void.  Thus an outpatient order was placed for this test that her primary care doctor can facilitate if necessary.  Patient was encouraged to follow-up with her primary care physician and her cardiologist and she was given very strict return precautions of which she voiced understanding.  At this time, patient safe for discharge as she has no persistent chest pain and no exertional symptoms here in the emergency department, ambulating without difficulty with no complaints of shortness of breath.  Patient then discharged. Final Clinical Impression(s) / ED Diagnoses Final diagnoses:  None    Rx / DC Orders ED Discharge Orders     None        Asha Grumbine, MD 10/04/20 1751    Teressa Lower, MD 10/04/20 1801

## 2020-10-04 NOTE — ED Notes (Signed)
Pt stated her sugar is 80. I provided pt some orange juice.

## 2020-10-04 NOTE — ED Notes (Signed)
Assisted pt to the restroom. She was able to walk with a steady gait.

## 2020-10-04 NOTE — ED Notes (Signed)
EDP at the bedside discussing care with pt and brother.

## 2020-10-04 NOTE — ED Notes (Signed)
Pt returned from CT °

## 2020-10-04 NOTE — ED Provider Notes (Signed)
Emergency Medicine Provider Triage Evaluation Note  [name] , a 80 y.o. female  was evaluated in triage.  Pt complains of intermittent chest pain since last night.  She states that she had an episode of chest pain last night over the left chest with radiation to the left jaw and left arm.  Took nitroglycerin last night and pain resolved.  Had another episode this morning which was the same and relieved with nitroglycerin prior to arrival.  She reports associated nausea, headache, and shortness of breath but denies any fever or chills. Review of Systems  Positive:  Negative: See above  Physical Exam  BP (!) 162/59 (BP Location: Right Arm)   Pulse (!) 58   Temp 98.2 F (36.8 C) (Oral)   Resp 18   SpO2 100%  Gen:   Awake, no distress   Resp:  Normal effort, clear to auscultation bilaterally MSK:   Moves extremities without difficulty  Other:  Heart rate is regular  Medical Decision Making  Medically screening exam initiated at 1:23 PM.  Appropriate orders placed.  [name] was informed that the remainder of the evaluation will be completed by another provider, this initial triage assessment does not replace that evaluation, and the importance of remaining in the ED until their evaluation is complete.     [name] [place], PA-C 10/04/20 Susan [place], MD 10/06/20 [phone]

## 2020-10-04 NOTE — ED Notes (Signed)
Pt stated she has a hx of HTN. She usually takes hydralazine around 1830. Notified EDP.

## 2020-10-05 ENCOUNTER — Other Ambulatory Visit: Payer: Self-pay | Admitting: Internal Medicine

## 2020-10-05 ENCOUNTER — Ambulatory Visit (HOSPITAL_COMMUNITY)
Admission: RE | Admit: 2020-10-05 | Discharge: 2020-10-05 | Disposition: A | Discharge status: Home / Self Care | Payer: Medicare PPO | Source: Ambulatory Visit | Attending: Internal Medicine | Admitting: Internal Medicine

## 2020-10-05 ENCOUNTER — Other Ambulatory Visit (HOSPITAL_COMMUNITY): Payer: Self-pay | Admitting: Internal Medicine

## 2020-10-05 DIAGNOSIS — M7989 Other specified soft tissue disorders: Secondary | ICD-10-CM | POA: Insufficient documentation

## 2020-10-05 DIAGNOSIS — M79669 Pain in unspecified lower leg: Secondary | ICD-10-CM

## 2020-10-05 DIAGNOSIS — I824Y9 Acute embolism and thrombosis of unspecified deep veins of unspecified proximal lower extremity: Secondary | ICD-10-CM

## 2020-10-06 ENCOUNTER — Other Ambulatory Visit: Payer: Medicare PPO

## 2020-10-07 ENCOUNTER — Encounter: Payer: Self-pay | Admitting: Cardiology

## 2020-10-07 ENCOUNTER — Other Ambulatory Visit: Payer: Self-pay

## 2020-10-07 ENCOUNTER — Ambulatory Visit: Payer: Medicare PPO | Admitting: Cardiology

## 2020-10-07 VITALS — BP 158/70 | HR 55 | Temp 98.0°F | Resp 16 | Ht 63.0 in | Wt 177.0 lb

## 2020-10-07 DIAGNOSIS — R0789 Other chest pain: Secondary | ICD-10-CM

## 2020-10-07 DIAGNOSIS — I25118 Atherosclerotic heart disease of native coronary artery with other forms of angina pectoris: Secondary | ICD-10-CM

## 2020-10-07 MED ORDER — ISOSORBIDE MONONITRATE ER 120 MG PO TB24: 240.0000 mg | ORAL_TABLET | Freq: Every day | ORAL | 3 refills | Status: DC

## 2020-10-07 NOTE — Progress Notes (Signed)
Virtual visit  Subjective:   [name], female    DOB: Jan 31, 1940, 80 y.o.   MRN: [id number]  HPI   Chief Complaint  Patient presents with   Coronary artery disease involving native coronary artery of   Chest Pain   Hospitalization Follow-up    80 y.o. African American female with hypertension, hyperlipidemia, diabetes with CKD stage V, CAD, h/o strokes (2015, 2020), s/p TAVR for severe AS (11/2018), now with chest pain  Patient was seen in [facility] emergency room on 10/04/2020 with complaints of chest pain.  Pain was responsive to nitroglycerin.  High-sensitivity troponin was mildly elevated at 26 and 29.  There was also significant finding on chest x-ray of new bilateral nodular densities that may be infectious in etiology.  She was started on 1 week course of Augmentin.  There was also concern that these nodules may be granulomatous and she may need QuantiFERON gold testing.  Patient is a few episodes of chest pain since then.  Pain is moderate with rest, usually at rest, and also less than 1 minute.  She does not have any significant exertional component to her pain.  Blood pressure is reasonably well controlled.  She has Bilateral leg edema.  She is Closely followed up by her PCP Dr. Hollie Salk.    Current Outpatient Medications on File Prior to Visit  Medication Sig Dispense Refill   acetaminophen (TYLENOL) 325 MG tablet Take 650 mg by mouth every 6 (six) hours as needed for moderate pain or headache.      allopurinol (ZYLOPRIM) 100 MG tablet Take 50 mg by mouth daily.     amLODipine (NORVASC) 10 MG tablet TAKE 1 TABLET BY MOUTH EVERY DAY (Patient taking differently: Take 10 mg by mouth daily.) 90 tablet 3   amoxicillin-clavulanate (AUGMENTIN) 875-125 MG tablet Take 1 tablet by mouth every 12 (twelve) hours. 14 tablet 0   aspirin EC 81 MG tablet Take 1 tablet (81 mg total) by mouth daily. Swallow whole. 150 tablet 2   brimonidine (ALPHAGAN) 0.2 % ophthalmic solution Place 1  drop into the left eye daily.     calcitRIOL (ROCALTROL) 0.25 MCG capsule Take 0.25 mcg by mouth daily.      ezetimibe (ZETIA) 10 MG tablet Take 10 mg by mouth daily.     ferrous sulfate 325 (65 FE) MG tablet Take 325 mg by mouth daily with breakfast.     folic acid (FOLVITE) 1 MG tablet Take 1 mg by mouth daily.     HUMALOG KWIKPEN 100 UNIT/ML KwikPen Inject 4-8 Units into the skin See admin instructions. Inject 4-8 units into the skin 3 times a day before meals, per sliding scale     hydrALAZINE (APRESOLINE) 50 MG tablet Take 1 tablet (50 mg total) by mouth every 8 (eight) hours. 90 tablet 0   insulin glargine (LANTUS) 100 UNIT/ML injection Inject 0.09 mLs (9 Units total) into the skin at bedtime.     isosorbide mononitrate (IMDUR) 120 MG 24 hr tablet Take 1 tablet (120 mg total) by mouth daily. 90 tablet 3   labetalol (NORMODYNE) 100 MG tablet Take 150 mg by mouth 2 (two) times daily.     polyvinyl alcohol (LIQUIFILM TEARS) 1.4 % ophthalmic solution Place 1 drop into both eyes 3 (three) times daily as needed for dry eyes.     senna-docusate (SENOKOT-S) 8.6-50 MG tablet Take 2 tablets by mouth 2 (two) times daily. 120 tablet 0   torsemide (DEMADEX) 20 MG tablet  Take 2 tablets (40 mg total) by mouth daily. 60 tablet 0   venlafaxine XR (EFFEXOR-XR) 150 MG 24 hr capsule Take 1 capsule (150 mg total) by mouth daily. 30 capsule 0   Current Facility-Administered Medications on File Prior to Visit  Medication Dose Route Frequency Provider Last Rate Last Admin   Evolocumab SOAJ 140 mg  140 mg Subcutaneous Once Cantwell, Celeste C, PA-C        Cardiovascular & other pertient studies:  RHC/LHC 10/2018: RA: 4 mmHg RV: 54/9 mmHg PA: 54/17 mmHg, mean PAP 34 mmhg. PCWP: 25 mmHg with tall V wave   CO: 5.2 L/min CI: 3 L/min/m2   LM: Normal LAD: Normal LCx:: Normal RCA: Ostial calcific at least 60% stenosis. Ostial RPDA 20% stenosis.    Right coronary artery was difficult to engage. I had to use  multiple catheters, and was finally able to engage with [facility] catheter. there was dampnening of the pressure with catheter engagement. Ostium is at least 60% stenosed with moderate calcification. There was no signifciant improvement with IC NTG. I did not perform FFR in order to limit contrast use in this patient with Cr of 3.0, not on dialysis.    Recommendation: Will discuss with structural valve clinic. If functional evaluation of RCA is felt necesary prior to TAVR/SAVR, would consider non-invasive stress test.   EKG 10/07/2020: Sinus rhythm 58 bpm Cannot exclude old inferior infarct Nonspecific T-abnormality  Bilateral renal artery ultrasound 05/14/2020: Right: Normal size right kidney. Abnormal right Resistive Index. RRV         flow present.  Left:  Normal size of left kidney. Abnormal left Resisitve Index.         LRV flow present. Cyst(s) noted.   Echocardiogram 05/12/2020:  1. Left ventricular ejection fraction, by estimation, is 60 to 65%. The  left ventricle has normal function. The left ventricle has no regional  wall motion abnormalities. Left ventricular diastolic parameters are  consistent with Grade II diastolic  dysfunction (pseudonormalization). Elevated left atrial pressure.   2. Right ventricular systolic function is normal. The right ventricular  size is normal. There is normal pulmonary artery systolic pressure.   3. The mitral valve is normal in structure. Trivial mitral valve  regurgitation. No evidence of mitral stenosis.   4. The aortic valve has been repaired/replaced. Aortic valve  regurgitation is not visualized. No aortic stenosis is present. There is a  23 mm Sapien prosthetic (TAVR) valve present in the aortic position.  Procedure Date: 11/13/2018. Echo findings are  consistent with normal structure and function of the aortic valve  prosthesis.   5. The inferior vena cava is normal in size with greater than 50%  respiratory variability, suggesting right  atrial pressure of 3 mmHg.  Echocardiogram 12/23/2019:  Left ventricle cavity is normal in size. Moderate concentric hypertrophy  of the left ventricle. Normal global wall motion. Normal LV systolic  function with EF 56%. Normal diastolic filling pattern.  Left atrial cavity is mildly dilated.  Well seated Wende Crease Sapien 3 bioprosthetic aortic valve with  normal functioning. Mean PG 11 mmHg. No valvular regurgitation or  paravalvular leak.  Mild (Grade I) mitral regurgitation.  Mild tricuspid regurgitation. Estimated pulmonary artery systolic pressure  31 mmHg.  Mild pulmonic regurgitation.  No significant change compared to previous study on 12/11/2018.  EKG 01/02/2020: Sinus rhythm 67 bpm Poor R-wave progression Low voltage Nonspecific T-abnormality  Lexiscan (Walking with mod Bruce)Tetrofosmin Stress Test  06/25/2019: Nondiagnostic ECG stress.  The  baseline blood pressure was 130/80 mmHg. Maximum blood pressure post injection was 200/70 mmHg, which is a hypertensive response to Lexiscan with Treadmill.  Myocardial perfusion is normal. Overall LV systolic function is normal without regional wall motion abnormalities. Stress LV EF: 61%.  No significant change from 08/06/2018. Low risk.   EKG 05/31/2019: Sinus rhythm 65 bpm Normal EKG   Right and left heart cath 10/23/18:  RA: 4 mmHg RV: 54/9 mmHg PA: 54/17 mmHg, mean PAP 34 mmhg. PCWP: 25 mmHg with tall V wave   CO: 5.2 L/min CI: 3 L/min/m2   LM: Normal LAD: Normal LCx:: Normal RCA: Ostial calcific at least 60% stenosis. Ostial RPDA 20% stenosis.    Right coronary artery was difficult to engage. I had to use multiple catheters, and was finally able to engage with [facility] catheter. there was dampnening of the pressure with catheter engagement. Ostium is at least 60% stenosed with moderate calcification. There was no signifciant improvement with IC NTG. I did not perform FFR in order to limit contrast use in this  patient with Cr of 3.0, not on dialysis.  MRA of head 09/10/2018:  1. Acute infarct right occipital lobe. No associated hemorrhage in the infarct. Chronic microhemorrhage right parietal white matter appears separate. 2. Right M1 stent obscures evaluation of the right middle cerebral artery on MRA 3. Right posterior cerebral artery patent. 4. Occluded distal P3 branch on the left.   Carotid artery duplex 12/04/2013: No evidence of hemodynamically significant stenosis in the bilateral carotid bifurcation vessels. There is evidence of minimal heterogeneous plaque in the bilateral carotid artery.  Recent labs: 08/13/2020: Glucose 135, BUN 45, creatinine 2.49, GFR 19, sodium 140, potassium 4.9 Hgb 11, HCT 32.7, MCV 83, platelet 218  06/27/2020: Glucose 159, BUN/Cr 99/5.46. EGFR 7. Na/K 134/3.5. AST/ALT 104/51. Total protein 5.8. Albumin 2.7. H/H 9/27. MCV 86. Platelets 196  05/14/2020: Hemoglobin 9.3, hematocrit 28.9, MCV 90.9, platelet 213 Sodium 136, potassium 4.1, glucose 95, BUN 49, creatinine 3.55, AST 23, ALT 15, alk phos 65, GFR 12  05/11/2020: TSH 3.147 A1c 7.3% BNP 302  02/11/2020: Total cholesterol 296, HDL 67, LDL 199, triglycerides 148  10/24/2019: Glucose 122. BUN/Cr 35/2.  05/03/2019:  Glucose 252, BUN/Cr 51/2.69. EGFR 19. Na/K 138/4.2. Rest of the CMP normal. H/H 10.7/33. MCV 90. Platelets 261  Chol 145, TG 154, HDL 45, LDL 74.  NTproBNP 615.   Uric Acid 8.3 (H)  11/09/2018: HbA1C 7.6%  03/06/2018: TSH 1.82 normal   Review of Systems  Constitutional: Negative for malaise/fatigue and weight gain.  Cardiovascular:  Positive for chest pain. Negative for claudication, near-syncope, orthopnea and paroxysmal nocturnal dyspnea.  Respiratory:  Negative for shortness of breath.   Hematologic/Lymphatic: Does not bruise/bleed easily.  Gastrointestinal:  Negative for melena.  Neurological:  Negative for dizziness and weakness.       Vitals:   10/07/20 0940   BP: (!) 158/70  Pulse: (!) 55  Resp: 16  Temp: 98 F (36.7 C)  SpO2: 100%    Body mass index is 31.35 kg/m. Filed Weights   10/07/20 0940  Weight: 177 lb (80.3 kg)     Objective:   Physical Exam Vitals and nursing note reviewed.  Constitutional:      General: She is not in acute distress.    Appearance: She is well-developed.  Cardiovascular:     Rate and Rhythm: Normal rate and regular rhythm.     Pulses: Intact distal pulses.     Heart sounds: S1 normal  and S2 normal. Murmur heard.  Harsh midsystolic murmur is present with a grade of 1/6 at the upper right sternal border radiating to the neck.    No gallop.  Pulmonary:     Effort: Pulmonary effort is normal.     Breath sounds: Normal breath sounds.  Musculoskeletal:     Right lower leg: Edema (1+) present.     Left lower leg: Edema (1+) present.  Neurological:     Mental Status: She is alert.      Assessment & Recommendations:   80 y.o. African American female with hypertension, hyperlipidemia, diabetes with CKD stage V, CAD, h/o strokes (2015, 2020), s/p TAVR for severe AS (11/2018), now with chest pain  Chest pain: Known CAD with moderate ostial RCA disease on pre-TAVR cath in 2020.  It is possible that she has had progression of her CAD.  However, her short lasting chest pain at rest are unlikely to be angina.  Given her CKD stage IV/V with no imminent plan for dialysis, I would like to avoid invasive management as best possible.  I have increased her Imdur to 240 mg daily. Continue follow-up with PCP regarding chest x-ray abnormalities, possible infectious cause. Follow-up in 4 weeks  Hypertension: Fairly well controlled.  S/P TAVR (transcatheter aortic valve replacement): Normal functioning bioprosthetic aortic valve (Echocardiogram 12/2019). Continue Aspirin 81 mg daily. Will obtain repeat echocardiogram in 12/2020, orders in place. Patient aware she will need antibiotic prophylaxis for any dental  procedures  Chronic kidney disease (CKD), stage V: F/u w/Dr/. Upton  F/u in 4 weeks    [employer], PA-C 10/07/2020, 8:37 AM Office: [phone]

## 2020-10-29 ENCOUNTER — Other Ambulatory Visit: Payer: Self-pay | Admitting: Cardiology

## 2020-11-03 ENCOUNTER — Other Ambulatory Visit: Payer: Self-pay

## 2020-11-03 ENCOUNTER — Encounter (INDEPENDENT_AMBULATORY_CARE_PROVIDER_SITE_OTHER): Payer: Self-pay | Admitting: Ophthalmology

## 2020-11-03 ENCOUNTER — Ambulatory Visit (INDEPENDENT_AMBULATORY_CARE_PROVIDER_SITE_OTHER): Payer: Medicare PPO | Admitting: Ophthalmology

## 2020-11-03 DIAGNOSIS — E113551 Type 2 diabetes mellitus with stable proliferative diabetic retinopathy, right eye: Secondary | ICD-10-CM | POA: Diagnosis not present

## 2020-11-03 DIAGNOSIS — H348122 Central retinal vein occlusion, left eye, stable: Secondary | ICD-10-CM | POA: Diagnosis not present

## 2020-11-03 DIAGNOSIS — E113512 Type 2 diabetes mellitus with proliferative diabetic retinopathy with macular edema, left eye: Secondary | ICD-10-CM | POA: Diagnosis not present

## 2020-11-03 MED ORDER — REPATHA SURECLICK 140 MG/ML ~~LOC~~ SOAJ
1.0000 mL | SUBCUTANEOUS | 3 refills | Status: DC
Start: 2020-11-03 — End: 2021-02-05

## 2020-11-03 NOTE — Progress Notes (Signed)
11/03/2020     CHIEF COMPLAINT Patient presents for  Chief Complaint  Patient presents with   Retina Follow Up      HISTORY OF PRESENT ILLNESS: [name] is a 80 y.o. female who presents to the clinic today for:   HPI     Retina Follow Up   Patient presents with  Diabetic Retinopathy.  In both eyes.  This started 4 months ago.  Duration of 4 months.  Since onset it is stable.        Comments   4 month f/u OU with OCT and FP  EyeMeds: Brimonidine QAM OS      Last edited by Reather Littler, COA on 11/03/2020  9:25 AM.      Referring physician: Audley Hose, MD [place],  [place] [address]  HISTORICAL INFORMATION:   Selected notes from the MEDICAL RECORD NUMBER    Lab Results  Component Value Date   HGBA1C 7.3 (H) 05/11/2020     CURRENT MEDICATIONS: Current Outpatient Medications (Ophthalmic Drugs)  Medication Sig   brimonidine (ALPHAGAN) 0.2 % ophthalmic solution Place 1 drop into the left eye daily.   polyvinyl alcohol (LIQUIFILM TEARS) 1.4 % ophthalmic solution Place 1 drop into both eyes 3 (three) times daily as needed for dry eyes.   No current facility-administered medications for this visit. (Ophthalmic Drugs)   Current Outpatient Medications (Other)  Medication Sig   acetaminophen (TYLENOL) 325 MG tablet Take 650 mg by mouth every 6 (six) hours as needed for moderate pain or headache.    allopurinol (ZYLOPRIM) 100 MG tablet Take 50 mg by mouth daily.   amLODipine (NORVASC) 10 MG tablet TAKE 1 TABLET BY MOUTH EVERY DAY   amoxicillin-clavulanate (AUGMENTIN) 875-125 MG tablet Take 1 tablet by mouth every 12 (twelve) hours.   aspirin EC 81 MG tablet Take 1 tablet (81 mg total) by mouth daily. Swallow whole.   calcitRIOL (ROCALTROL) 0.25 MCG capsule Take 0.25 mcg by mouth daily.    ezetimibe (ZETIA) 10 MG tablet Take 10 mg by mouth daily.   ferrous sulfate 325 (65 FE) MG tablet Take 325 mg by mouth daily with  breakfast.   folic acid (FOLVITE) 1 MG tablet Take 1 mg by mouth daily.   HUMALOG KWIKPEN 100 UNIT/ML KwikPen Inject 4-8 Units into the skin See admin instructions. Inject 4-8 units into the skin 3 times a day before meals, per sliding scale   hydrALAZINE (APRESOLINE) 50 MG tablet Take 1 tablet (50 mg total) by mouth every 8 (eight) hours.   insulin glargine (LANTUS) 100 UNIT/ML injection Inject 0.09 mLs (9 Units total) into the skin at bedtime.   isosorbide mononitrate (IMDUR) 120 MG 24 hr tablet Take 2 tablets (240 mg total) by mouth daily.   labetalol (NORMODYNE) 100 MG tablet Take 150 mg by mouth 2 (two) times daily.   senna-docusate (SENOKOT-S) 8.6-50 MG tablet Take 2 tablets by mouth 2 (two) times daily.   torsemide (DEMADEX) 20 MG tablet Take 2 tablets (40 mg total) by mouth daily.   venlafaxine XR (EFFEXOR-XR) 150 MG 24 hr capsule Take 1 capsule (150 mg total) by mouth daily.   Current Facility-Administered Medications (Other)  Medication Route   Evolocumab SOAJ 140 mg Subcutaneous      REVIEW OF SYSTEMS: ROS   Negative for: Constitutional, Gastrointestinal, Neurological, Skin, Genitourinary, Musculoskeletal, HENT, Endocrine, Cardiovascular, Eyes, Respiratory, Psychiatric, Allergic/Imm, Heme/Lymph Last edited by Hurman Horn, MD on  11/03/2020  9:44 AM.       ALLERGIES No Known Allergies  PAST MEDICAL HISTORY Past Medical History:  Diagnosis Date   Arthritis    Cerebrovascular disease    CKD (chronic kidney disease)    Sees Dr Hollie Salk   Coronary artery disease    Depression    Diabetes ([place])    INSULIN DEPENDENT   Diabetic peripheral neuropathy (HCC)    Diabetic retinopathy ([place])    Diverticulitis    GERD (gastroesophageal reflux disease)    History of CVA (cerebrovascular accident)    x 2 no residulal   Hyperlipidemia    Hypertension    Obesity    Renal lesion    S/P TAVR (transcatheter aortic valve replacement) 11/13/2018   s/p TAVR with a 65m Edwards S3U  via the TF approach by Dr. ORoxy Mannsand Dr. MAngelena Form  Severe aortic stenosis    Past Surgical History:  Procedure Laterality Date   ABDOMINAL HYSTERECTOMY     AV FISTULA PLACEMENT Left 12/18/2017   Procedure: ARTERIOVENOUS (AV) FISTULA CREATION ARM;  Surgeon: CMarty Heck MD;  [facility];  Service: Vascular;  Laterality: Left;   bilateral cataract surgery     CARDIAC CATHETERIZATION  01/07/2014   DR GEinar Gip  COLON RESECTION  02/1999   COLONOSCOPY     COLOSTOMY  02/1999   COLOSTOMY CLOSURE  07/1999   EYE SURGERY Left 2019   FISTULA SUPERFICIALIZATION Left 06/29/2018   Procedure: FISTULA SUPERFICIALIZATION LEFT BRACHIOCEPHALIC;  Surgeon: CMarty Heck MD;  [facility]  Service: Vascular;  Laterality: Left;   LEFT HEART CATHETERIZATION WITH CORONARY ANGIOGRAM N/A 01/07/2014   Procedure: LEFT HEART CATHETERIZATION WITH CORONARY ANGIOGRAM;  Surgeon: JLaverda Page MD;  [facility];  Service: Cardiovascular;  Laterality: N/A;   LOOP RECORDER INSERTION N/A 02/17/2020   Procedure: LOOP RECORDER INSERTION;  Surgeon: KDeboraha Sprang MD;  [facility];  Service: Cardiovascular;  Laterality: N/A;   middle cerebral artery stent placement Right    OTHER SURGICAL HISTORY     laser surgery   PTCA  01/07/2014   DES to RCA    DR GEinar Gip  right knee surgery Right    for infection   RIGHT/LEFT HEART CATH AND CORONARY ANGIOGRAPHY N/A 10/23/2018   Procedure: RIGHT/LEFT HEART CATH AND CORONARY ANGIOGRAPHY;  Surgeon: PNigel Mormon MD;  [facility];  Service: Cardiovascular;  Laterality: N/A;   TEE WITHOUT CARDIOVERSION N/A 11/13/2018   Procedure: TRANSESOPHAGEAL ECHOCARDIOGRAM (TEE);  Surgeon: MBurnell Blanks MD;  [facility]  Service: Open Heart Surgery;  Laterality: N/A;   TRANSCATHETER AORTIC VALVE REPLACEMENT, TRANSFEMORAL  11/13/2018   TRANSCATHETER AORTIC VALVE REPLACEMENT, TRANSFEMORAL N/A 11/13/2018    Procedure: TRANSCATHETER AORTIC VALVE REPLACEMENT, TRANSFEMORAL;  Surgeon: MBurnell Blanks MD;  [facility]  Service: Open Heart Surgery;  Laterality: N/A;    FAMILY HISTORY Family History  Problem Relation Age of Onset   Diabetes Mother    Heart disease Mother    Prostate cancer Father    Hypertension Brother    Prostate cancer Brother     SOCIAL HISTORY Social History   Tobacco Use   Smoking status: Never   Smokeless tobacco: Never  Vaping Use   Vaping Use: Never used  Substance Use Topics   Alcohol use: No   Drug use: No         OPHTHALMIC EXAM:  Base Eye Exam  Visual Acuity (ETDRS)       Right Left   Dist [place] 20/20 CF'@4ft'$    Dist ph [place]  NI         Tonometry (Tonopen, 9:32 AM)       Right Left   Pressure 8 10         Pupils       Dark Light Shape React APD   Right 3 2 Round Brisk None   Left 5 5 Irregular Minimal +1         Visual Fields (Counting fingers)       Left Right   Restrictions Total inferior temporal, superior nasal, inferior nasal deficiencies Total superior nasal deficiency         Extraocular Movement       Right Left    Full, Ortho Full, Ortho         Neuro/Psych     Oriented x3: Yes   Mood/Affect: Normal         Dilation     Both eyes: 1.0% Mydriacyl, 2.5% Phenylephrine @ 9:31 AM           Slit Lamp and Fundus Exam     External Exam       Right Left   External Normal Normal         Slit Lamp Exam       Right Left   Lids/Lashes Normal Normal   Conjunctiva/Sclera White and quiet White and quiet   Cornea Band keratopathy at limbus not in vis axis Clear   Anterior Chamber Deep and quiet Deep and quiet   Iris Round and reactive Round and reactive   Lens Centered posterior chamber intraocular lens Centered anterior chamber intraocular lens   Anterior Vitreous Normal Normal         Fundus Exam       Right Left   Posterior Vitreous Posterior vitreous detachment  vitrectomized   Disc Normal Normal, pink   C/D Ratio 0.6 0.6   Macula Normal  Cystoid macular edema , no exudates, Macular thickening, Microaneurysms, no cystoid macular edema   Vessels NPDR-Severe, stable, no N/V old CRVO   Periphery good peripheral prp good prp 360            IMAGING AND PROCEDURES  Imaging and Procedures for 11/03/20  OCT, Retina - OU - Both Eyes       Right Eye Quality was good. Scan locations included subfoveal. Central Foveal Thickness: 263. Progression has been stable. Findings include normal foveal contour.   Left Eye Quality was good. Scan locations included subfoveal. Central Foveal Thickness: 255. Progression has been stable. Findings include abnormal foveal contour.   Notes OS with ischemic maculopathy, minor CME, will observe  Off intravitreal Avastin OS since 10/21/2019  OD normal macular findings incidental posterior vitreous detachment  Observe OU     Color Fundus Photography Optos - OU - Both Eyes       Right Eye Progression has been stable. Disc findings include normal observations. Macula : normal observations. Vessels : normal observations. Periphery : normal observations.   Left Eye Progression has been stable. Disc findings include increased cup to disc ratio, pallor. Macula : microaneurysms, edema.   Notes OS small region nasal to the fovea focal CME with small amount of exudate, this could represent a small twig BRVO as it does not involve the center of the fovea, this also could be a small rap-like lesion, stable overall  OD with severe  NPDR stabilized by peripheral PRP               ASSESSMENT/PLAN:  Stable treated proliferative diabetic retinopathy of right eye without macular edema determined by examination associated with type 2 diabetes mellitus ([place]) OD, findings of severe NPDR no signs of NVE today, good PRP peripherally stabilizing macula and peripheral retina will observe  Proliferative diabetic  retinopathy of left eye with macular edema associated with type 2 diabetes mellitus (HCC) Stable OS with superimposed concomitant CRV O ischemic in the past now resolved  Central retinal vein occlusion, left eye, stable Ischemic maculopathy accounts for vision     ICD-10-CM   1. Stable treated proliferative diabetic retinopathy of right eye without macular edema determined by examination associated with type 2 diabetes mellitus ([place])  E11.3551 OCT, Retina - OU - Both Eyes    Color Fundus Photography Optos - OU - Both Eyes    2. Proliferative diabetic retinopathy of left eye with macular edema associated with type 2 diabetes mellitus ([place])  [id number]     3. Central retinal vein occlusion, left eye, stable  H34.8122       1.  OD with severe NPDR findings no residual PDR seen.  Stable PDR post PRP will observe  2.  OS with old PDR now stabilized and quiet with superimposed prior CRV O years past also stabilized and resolved leaving behind ischemic maculopathy accounting for vision.  Will observe  3.  Ophthalmic Meds Ordered this visit:  No orders of the defined types were placed in this encounter.      Return in about 6 months (around 05/04/2021) for DILATE OU, COLOR FP, OCT.  There are no Patient Instructions on file for this visit.   Explained the diagnoses, plan, and follow up with the patient and they expressed understanding.  Patient expressed understanding of the importance of proper follow up care.   Clent Demark Pritika Alvarez [name] Diseases & Surgery of the Retina and Vitreous Retina & Diabetic [place] 11/03/20     Abbreviations: M myopia (nearsighted); A astigmatism; H hyperopia (farsighted); P presbyopia; Mrx spectacle prescription;  CTL contact lenses; OD right eye; OS left eye; OU both eyes  XT exotropia; ET esotropia; PEK punctate epithelial keratitis; PEE punctate epithelial erosions; DES dry eye syndrome; MGD meibomian gland dysfunction; ATs artificial tears; PFAT's  preservative free artificial tears; [place] nuclear sclerotic cataract; PSC posterior subcapsular cataract; ERM epi-retinal membrane; PVD posterior vitreous detachment; RD retinal detachment; DM diabetes mellitus; DR diabetic retinopathy; NPDR non-proliferative diabetic retinopathy; PDR proliferative diabetic retinopathy; CSME clinically significant macular edema; DME diabetic macular edema; dbh dot blot hemorrhages; CWS cotton wool spot; POAG primary open angle glaucoma; C/D cup-to-disc ratio; HVF humphrey visual field; GVF goldmann visual field; OCT optical coherence tomography; IOP intraocular pressure; BRVO Branch retinal vein occlusion; CRVO central retinal vein occlusion; CRAO central retinal artery occlusion; BRAO branch retinal artery occlusion; RT retinal tear; SB scleral buckle; PPV pars plana vitrectomy; VH Vitreous hemorrhage; PRP panretinal laser photocoagulation; IVK intravitreal kenalog; VMT vitreomacular traction; MH Macular hole;  NVD neovascularization of the disc; NVE neovascularization elsewhere; AREDS age related eye disease study; ARMD age related macular degeneration; POAG primary open angle glaucoma; EBMD epithelial/anterior basement membrane dystrophy; ACIOL anterior chamber intraocular lens; IOL intraocular lens; PCIOL posterior chamber intraocular lens; Phaco/IOL phacoemulsification with intraocular lens placement; [place] photorefractive keratectomy; LASIK laser assisted in situ keratomileusis; HTN hypertension; DM diabetes mellitus; COPD chronic obstructive pulmonary disease

## 2020-11-03 NOTE — Assessment & Plan Note (Signed)
OD, findings of severe NPDR no signs of NVE today, good PRP peripherally stabilizing macula and peripheral retina will observe

## 2020-11-03 NOTE — Assessment & Plan Note (Signed)
Ischemic maculopathy accounts for vision

## 2020-11-03 NOTE — Assessment & Plan Note (Signed)
Stable OS with superimposed concomitant CRV O ischemic in the past now resolved

## 2020-11-04 ENCOUNTER — Encounter (INDEPENDENT_AMBULATORY_CARE_PROVIDER_SITE_OTHER): Payer: Self-pay

## 2020-11-05 ENCOUNTER — Ambulatory Visit: Payer: Medicare PPO | Admitting: Cardiology

## 2020-11-05 ENCOUNTER — Other Ambulatory Visit: Payer: Self-pay

## 2020-11-05 ENCOUNTER — Encounter: Payer: Self-pay | Admitting: Cardiology

## 2020-11-05 VITALS — BP 157/49 | HR 57 | Temp 97.8°F | Resp 17 | Ht 63.0 in | Wt 180.0 lb

## 2020-11-05 DIAGNOSIS — Z952 Presence of prosthetic heart valve: Secondary | ICD-10-CM

## 2020-11-05 DIAGNOSIS — I25118 Atherosclerotic heart disease of native coronary artery with other forms of angina pectoris: Secondary | ICD-10-CM

## 2020-11-05 DIAGNOSIS — I10 Essential (primary) hypertension: Secondary | ICD-10-CM

## 2020-11-05 MED ORDER — LABETALOL HCL 200 MG PO TABS: 200.0000 mg | ORAL_TABLET | Freq: Two times a day (BID) | ORAL | 3 refills | Status: DC

## 2020-11-05 MED ORDER — ISOSORBIDE MONONITRATE ER 120 MG PO TB24: 240.0000 mg | ORAL_TABLET | Freq: Every day | ORAL | 3 refills | Status: DC

## 2020-11-05 NOTE — Progress Notes (Signed)
Virtual visit  Subjective:   [name], female    DOB: 11-Jul-1940, 80 y.o.   MRN: [id number]  HPI   Chief Complaint  Patient presents with   Coronary artery disease involving native coronary artery of   Chronic heart failure with preserved ejection fraction    Follow-up    4 week    80 y.o. African American female with hypertension, hyperlipidemia, diabetes with CKD stage V, CAD, h/o strokes (2015, 2020), s/p TAVR for severe AS (11/2018), now with chest pain  Patient was seen in [facility] emergency room on 10/04/2020 with complaints of chest pain.  Pain was responsive to nitroglycerin.  High-sensitivity troponin was mildly elevated at 26 and 29.  There was also significant finding on chest x-ray of new bilateral nodular densities that may be infectious in etiology.  She was started on 1 week course of Augmentin.  There was also concern that these nodules may be granulomatous and she may need QuantiFERON gold testing.  Above testing is still pending.  From cardiac standpoint, angina symptoms are improved after increasing Imdur to 240 mg daily.  Blood pressure remains elevated.   Current Outpatient Medications on File Prior to Visit  Medication Sig Dispense Refill   acetaminophen (TYLENOL) 325 MG tablet Take 650 mg by mouth every 6 (six) hours as needed for moderate pain or headache.      allopurinol (ZYLOPRIM) 100 MG tablet Take 50 mg by mouth daily.     amLODipine (NORVASC) 10 MG tablet TAKE 1 TABLET BY MOUTH EVERY DAY 90 tablet 3   amoxicillin-clavulanate (AUGMENTIN) 875-125 MG tablet Take 1 tablet by mouth every 12 (twelve) hours. 14 tablet 0   aspirin EC 81 MG tablet Take 1 tablet (81 mg total) by mouth daily. Swallow whole. 150 tablet 2   brimonidine (ALPHAGAN) 0.2 % ophthalmic solution Place 1 drop into the left eye daily.     calcitRIOL (ROCALTROL) 0.25 MCG capsule Take 0.25 mcg by mouth daily.      Evolocumab (REPATHA SURECLICK) 076 MG/ML SOAJ Inject 1 mL into the skin  every 14 (fourteen) days. 2 mL 3   ezetimibe (ZETIA) 10 MG tablet Take 10 mg by mouth daily.     ferrous sulfate 325 (65 FE) MG tablet Take 325 mg by mouth daily with breakfast.     folic acid (FOLVITE) 1 MG tablet Take 1 mg by mouth daily.     HUMALOG KWIKPEN 100 UNIT/ML KwikPen Inject 4-8 Units into the skin See admin instructions. Inject 4-8 units into the skin 3 times a day before meals, per sliding scale     hydrALAZINE (APRESOLINE) 50 MG tablet Take 1 tablet (50 mg total) by mouth every 8 (eight) hours. 90 tablet 0   insulin glargine (LANTUS) 100 UNIT/ML injection Inject 0.09 mLs (9 Units total) into the skin at bedtime.     isosorbide mononitrate (IMDUR) 120 MG 24 hr tablet Take 2 tablets (240 mg total) by mouth daily. 90 tablet 3   labetalol (NORMODYNE) 100 MG tablet Take 150 mg by mouth 2 (two) times daily.     polyvinyl alcohol (LIQUIFILM TEARS) 1.4 % ophthalmic solution Place 1 drop into both eyes 3 (three) times daily as needed for dry eyes.     senna-docusate (SENOKOT-S) 8.6-50 MG tablet Take 2 tablets by mouth 2 (two) times daily. 120 tablet 0   torsemide (DEMADEX) 20 MG tablet Take 2 tablets (40 mg total) by mouth daily. 60 tablet 0   venlafaxine  XR (EFFEXOR-XR) 150 MG 24 hr capsule Take 1 capsule (150 mg total) by mouth daily. 30 capsule 0   Current Facility-Administered Medications on File Prior to Visit  Medication Dose Route Frequency Provider Last Rate Last Admin   Evolocumab SOAJ 140 mg  140 mg Subcutaneous Once Cantwell, Celeste C, PA-C        Cardiovascular & other pertient studies:  RHC/LHC 10/2018: RA: 4 mmHg RV: 54/9 mmHg PA: 54/17 mmHg, mean PAP 34 mmhg. PCWP: 25 mmHg with tall V wave   CO: 5.2 L/min CI: 3 L/min/m2   LM: Normal LAD: Normal LCx:: Normal RCA: Ostial calcific at least 60% stenosis. Ostial RPDA 20% stenosis.    Right coronary artery was difficult to engage. I had to use multiple catheters, and was finally able to engage with [facility] catheter.  there was dampnening of the pressure with catheter engagement. Ostium is at least 60% stenosed with moderate calcification. There was no signifciant improvement with IC NTG. I did not perform FFR in order to limit contrast use in this patient with Cr of 3.0, not on dialysis.    Recommendation: Will discuss with structural valve clinic. If functional evaluation of RCA is felt necesary prior to TAVR/SAVR, would consider non-invasive stress test.   EKG 10/07/2020: Sinus rhythm 58 bpm Cannot exclude old inferior infarct Nonspecific T-abnormality  Bilateral renal artery ultrasound 05/14/2020: Right: Normal size right kidney. Abnormal right Resistive Index. RRV         flow present.  Left:  Normal size of left kidney. Abnormal left Resisitve Index.         LRV flow present. Cyst(s) noted.   Echocardiogram 05/12/2020:  1. Left ventricular ejection fraction, by estimation, is 60 to 65%. The  left ventricle has normal function. The left ventricle has no regional  wall motion abnormalities. Left ventricular diastolic parameters are  consistent with Grade II diastolic  dysfunction (pseudonormalization). Elevated left atrial pressure.   2. Right ventricular systolic function is normal. The right ventricular  size is normal. There is normal pulmonary artery systolic pressure.   3. The mitral valve is normal in structure. Trivial mitral valve  regurgitation. No evidence of mitral stenosis.   4. The aortic valve has been repaired/replaced. Aortic valve  regurgitation is not visualized. No aortic stenosis is present. There is a  23 mm Sapien prosthetic (TAVR) valve present in the aortic position.  Procedure Date: 11/13/2018. Echo findings are  consistent with normal structure and function of the aortic valve  prosthesis.   5. The inferior vena cava is normal in size with greater than 50%  respiratory variability, suggesting right atrial pressure of 3 mmHg.  Echocardiogram 12/23/2019:  Left ventricle  cavity is normal in size. Moderate concentric hypertrophy  of the left ventricle. Normal global wall motion. Normal LV systolic  function with EF 56%. Normal diastolic filling pattern.  Left atrial cavity is mildly dilated.  Well seated Wende Crease Sapien 3 bioprosthetic aortic valve with  normal functioning. Mean PG 11 mmHg. No valvular regurgitation or  paravalvular leak.  Mild (Grade I) mitral regurgitation.  Mild tricuspid regurgitation. Estimated pulmonary artery systolic pressure  31 mmHg.  Mild pulmonic regurgitation.  No significant change compared to previous study on 12/11/2018.  EKG 01/02/2020: Sinus rhythm 67 bpm Poor R-wave progression Low voltage Nonspecific T-abnormality  Lexiscan (Walking with mod Bruce)Tetrofosmin Stress Test  06/25/2019: Nondiagnostic ECG stress.  The baseline blood pressure was 130/80 mmHg. Maximum blood pressure post injection was 200/70 mmHg, which  is a hypertensive response to [place] with Treadmill.  Myocardial perfusion is normal. Overall LV systolic function is normal without regional wall motion abnormalities. Stress LV EF: 61%.  No significant change from 08/06/2018. Low risk.   EKG 05/31/2019: Sinus rhythm 65 bpm Normal EKG   Right and left heart cath 10/23/18:  RA: 4 mmHg RV: 54/9 mmHg PA: 54/17 mmHg, mean PAP 34 mmhg. PCWP: 25 mmHg with tall V wave   CO: 5.2 L/min CI: 3 L/min/m2   LM: Normal LAD: Normal LCx:: Normal RCA: Ostial calcific at least 60% stenosis. Ostial RPDA 20% stenosis.    Right coronary artery was difficult to engage. I had to use multiple catheters, and was finally able to engage with [facility] catheter. there was dampnening of the pressure with catheter engagement. Ostium is at least 60% stenosed with moderate calcification. There was no signifciant improvement with IC NTG. I did not perform FFR in order to limit contrast use in this patient with Cr of 3.0, not on dialysis.  MRA of head 09/10/2018:  1.  Acute infarct right occipital lobe. No associated hemorrhage in the infarct. Chronic microhemorrhage right parietal white matter appears separate. 2. Right M1 stent obscures evaluation of the right middle cerebral artery on MRA 3. Right posterior cerebral artery patent. 4. Occluded distal P3 branch on the left.   Carotid artery duplex 12/04/2013: No evidence of hemodynamically significant stenosis in the bilateral carotid bifurcation vessels. There is evidence of minimal heterogeneous plaque in the bilateral carotid artery.  Recent labs: 08/13/2020: Glucose 135, BUN 45, creatinine 2.49, GFR 19, sodium 140, potassium 4.9 Hgb 11, HCT 32.7, MCV 83, platelet 218  06/27/2020: Glucose 159, BUN/Cr 99/5.46. EGFR 7. Na/K 134/3.5. AST/ALT 104/51. Total protein 5.8. Albumin 2.7. H/H 9/27. MCV 86. Platelets 196  05/14/2020: Hemoglobin 9.3, hematocrit 28.9, MCV 90.9, platelet 213 Sodium 136, potassium 4.1, glucose 95, BUN 49, creatinine 3.55, AST 23, ALT 15, alk phos 65, GFR 12  05/11/2020: TSH 3.147 A1c 7.3% BNP 302  02/11/2020: Total cholesterol 296, HDL 67, LDL 199, triglycerides 148  10/24/2019: Glucose 122. BUN/Cr 35/2.  05/03/2019:  Glucose 252, BUN/Cr 51/2.69. EGFR 19. Na/K 138/4.2. Rest of the CMP normal. H/H 10.7/33. MCV 90. Platelets 261  Chol 145, TG 154, HDL 45, LDL 74.  NTproBNP 615.   Uric Acid 8.3 (H)  11/09/2018: HbA1C 7.6%  03/06/2018: TSH 1.82 normal   Review of Systems  Constitutional: Negative for malaise/fatigue and weight gain.  Cardiovascular:  Positive for chest pain. Negative for claudication, near-syncope, orthopnea and paroxysmal nocturnal dyspnea.  Respiratory:  Negative for shortness of breath.   Hematologic/Lymphatic: Does not bruise/bleed easily.  Gastrointestinal:  Negative for melena.  Neurological:  Negative for dizziness and weakness.       There were no vitals filed for this visit.   There is no height or weight on file to calculate  BMI. There were no vitals filed for this visit.    Objective:   Physical Exam Vitals and nursing note reviewed.  Constitutional:      General: She is not in acute distress.    Appearance: She is well-developed.  Cardiovascular:     Rate and Rhythm: Normal rate and regular rhythm.     Pulses: Intact distal pulses.     Heart sounds: S1 normal and S2 normal. Murmur heard.  Harsh midsystolic murmur is present with a grade of 1/6 at the upper right sternal border radiating to the neck.    No gallop.  Pulmonary:  Effort: Pulmonary effort is normal.     Breath sounds: Normal breath sounds.  Musculoskeletal:     Right lower leg: Edema (1+) present.     Left lower leg: Edema (1+) present.  Neurological:     Mental Status: She is alert.      Assessment & Recommendations:   80 y.o. African American female with hypertension, hyperlipidemia, diabetes with CKD stage V, CAD, h/o strokes (2015, 2020), s/p TAVR for severe AS (11/2018), now with chest pain  Chest pain: Known CAD with moderate ostial RCA disease on pre-TAVR cath in 2020.   Symptoms improved with Imdur 240 mg daily.   Continue rest of the antihypertensive therapy.   Given her advanced CKD, not on dialysis, I prefer conservative management as far as possible.   Continue follow-up with PCP regarding chest x-ray abnormalities, possible infectious cause. Follow-up in 4 weeks  Hypertension: Change labetalol from 150 mg twice daily to 200 mg twice daily.  S/P TAVR (transcatheter aortic valve replacement): Normal functioning bioprosthetic aortic valve (Echocardiogram 12/2019). Continue Aspirin 81 mg daily. Will obtain repeat echocardiogram in 12/2020, orders in place. Patient aware she will need antibiotic prophylaxis for any dental procedures  Chronic kidney disease (CKD), stage V: F/u w/Dr/. Upton  F/u in 12/2020.      [place], PA-C 11/05/2020, 1:48 PM Office: [phone]

## 2020-11-10 LAB — LIPID PANEL
Chol/HDL Ratio: 2.9 ratio (ref 0.0–4.4)
Cholesterol, Total: 171 mg/dL (ref 100–199)
HDL: 60 mg/dL (ref >39)
LDL Chol Calc (NIH): 91 mg/dL (ref 0–99)
Triglycerides: 113 mg/dL (ref 0–149)
VLDL Cholesterol Cal: 20 mg/dL (ref 5–40)

## 2020-11-13 NOTE — Progress Notes (Signed)
Ok. Will repeat lipid panel after next visit.  Thanks MJP

## 2020-11-13 NOTE — Progress Notes (Signed)
Called and spoke with patient regarding her lab results. Patient stated that she is taking her Repatha and this Saturday will be her 2nd one.

## 2020-11-17 NOTE — Progress Notes (Signed)
Called patient, NA, LMAM

## 2020-12-04 NOTE — Progress Notes (Signed)
Left message on VM-box.

## 2020-12-09 ENCOUNTER — Other Ambulatory Visit: Payer: Self-pay | Admitting: Internal Medicine

## 2020-12-09 DIAGNOSIS — R918 Other nonspecific abnormal finding of lung field: Secondary | ICD-10-CM

## 2020-12-23 ENCOUNTER — Ambulatory Visit: Payer: Medicare PPO | Admitting: Neurology

## 2020-12-23 ENCOUNTER — Telehealth: Payer: Self-pay | Admitting: *Deleted

## 2020-12-23 NOTE — Telephone Encounter (Signed)
Called and spoke w/ pt. She was never contacted to get set up w/ ASV. I contacted [facility] and they never received order. He will work on this for pt. Pt aware to be on look out for phone call from [place]. Rescheduled appt to 03/25/21. Dr. Brett Fairy made aware of change made.

## 2020-12-26 ENCOUNTER — Encounter (HOSPITAL_COMMUNITY): Payer: Self-pay | Admitting: Emergency Medicine

## 2020-12-26 ENCOUNTER — Emergency Department (HOSPITAL_COMMUNITY): Payer: Medicare PPO

## 2020-12-26 ENCOUNTER — Emergency Department (HOSPITAL_COMMUNITY)
Admission: EM | Admit: 2020-12-26 | Discharge: 2020-12-26 | Disposition: A | Discharge status: Home / Self Care | Payer: Medicare PPO | Attending: Emergency Medicine | Admitting: Emergency Medicine

## 2020-12-26 DIAGNOSIS — I509 Heart failure, unspecified: Secondary | ICD-10-CM | POA: Diagnosis not present

## 2020-12-26 DIAGNOSIS — Z7982 Long term (current) use of aspirin: Secondary | ICD-10-CM | POA: Insufficient documentation

## 2020-12-26 DIAGNOSIS — N185 Chronic kidney disease, stage 5: Secondary | ICD-10-CM | POA: Insufficient documentation

## 2020-12-26 DIAGNOSIS — R0789 Other chest pain: Secondary | ICD-10-CM | POA: Diagnosis not present

## 2020-12-26 DIAGNOSIS — Z794 Long term (current) use of insulin: Secondary | ICD-10-CM | POA: Diagnosis not present

## 2020-12-26 DIAGNOSIS — R0602 Shortness of breath: Secondary | ICD-10-CM | POA: Diagnosis present

## 2020-12-26 DIAGNOSIS — I2510 Atherosclerotic heart disease of native coronary artery without angina pectoris: Secondary | ICD-10-CM | POA: Diagnosis not present

## 2020-12-26 DIAGNOSIS — I132 Hypertensive heart and chronic kidney disease with heart failure and with stage 5 chronic kidney disease, or end stage renal disease: Secondary | ICD-10-CM | POA: Insufficient documentation

## 2020-12-26 DIAGNOSIS — E1122 Type 2 diabetes mellitus with diabetic chronic kidney disease: Secondary | ICD-10-CM | POA: Diagnosis not present

## 2020-12-26 DIAGNOSIS — Z20822 Contact with and (suspected) exposure to covid-19: Secondary | ICD-10-CM | POA: Insufficient documentation

## 2020-12-26 DIAGNOSIS — R079 Chest pain, unspecified: Secondary | ICD-10-CM

## 2020-12-26 DIAGNOSIS — Z79899 Other long term (current) drug therapy: Secondary | ICD-10-CM | POA: Insufficient documentation

## 2020-12-26 LAB — COMPREHENSIVE METABOLIC PANEL
ALT: 21 U/L (ref 0–44)
AST: 32 U/L (ref 15–41)
Albumin: 4 g/dL (ref 3.5–5.0)
Alkaline Phosphatase: 104 U/L (ref 38–126)
Anion gap: 11 (ref 5–15)
BUN: 51 mg/dL — ABNORMAL HIGH (ref 8–23)
CO2: 21 mmol/L — ABNORMAL LOW (ref 22–32)
Calcium: 9.2 mg/dL (ref 8.9–10.3)
Chloride: 107 mmol/L (ref 98–111)
Creatinine, Ser: 3.28 mg/dL — ABNORMAL HIGH (ref 0.44–1.00)
GFR, Estimated: 14 mL/min — ABNORMAL LOW (ref >60)
Glucose, Bld: 132 mg/dL — ABNORMAL HIGH (ref 70–99)
Potassium: 4.5 mmol/L (ref 3.5–5.1)
Sodium: 139 mmol/L (ref 135–145)
Total Bilirubin: 0.4 mg/dL (ref 0.3–1.2)
Total Protein: 7.8 g/dL (ref 6.5–8.1)

## 2020-12-26 LAB — CBC WITH DIFFERENTIAL/PLATELET
Abs Immature Granulocytes: 0.03 10*3/uL (ref 0.00–0.07)
Basophils Absolute: 0.1 10*3/uL (ref 0.0–0.1)
Basophils Relative: 1 %
Eosinophils Absolute: 0.2 10*3/uL (ref 0.0–0.5)
Eosinophils Relative: 3 %
HCT: 35.6 % — ABNORMAL LOW (ref 36.0–46.0)
Hemoglobin: 11.1 g/dL — ABNORMAL LOW (ref 12.0–15.0)
Immature Granulocytes: 0 %
Lymphocytes Relative: 22 %
Lymphs Abs: 2 10*3/uL (ref 0.7–4.0)
MCH: 28 pg (ref 26.0–34.0)
MCHC: 31.2 g/dL (ref 30.0–36.0)
MCV: 89.9 fL (ref 80.0–100.0)
Monocytes Absolute: 0.6 10*3/uL (ref 0.1–1.0)
Monocytes Relative: 7 %
Neutro Abs: 6 10*3/uL (ref 1.7–7.7)
Neutrophils Relative %: 67 %
Platelets: 258 10*3/uL (ref 150–400)
RBC: 3.96 MIL/uL (ref 3.87–5.11)
RDW: 14.8 % (ref 11.5–15.5)
WBC: 8.9 10*3/uL (ref 4.0–10.5)
nRBC: 0 % (ref 0.0–0.2)

## 2020-12-26 LAB — RESP PANEL BY RT-PCR (FLU A&B, COVID) ARPGX2
Influenza A by PCR: NEGATIVE
Influenza B by PCR: NEGATIVE
SARS Coronavirus 2 by RT PCR: NEGATIVE

## 2020-12-26 LAB — TROPONIN I (HIGH SENSITIVITY)
Troponin I (High Sensitivity): 21 ng/L — ABNORMAL HIGH (ref <18)
Troponin I (High Sensitivity): 25 ng/L — ABNORMAL HIGH (ref <18)

## 2020-12-26 LAB — BRAIN NATRIURETIC PEPTIDE: B Natriuretic Peptide: 632.8 pg/mL — ABNORMAL HIGH (ref 0.0–100.0)

## 2020-12-26 MED ORDER — NITROGLYCERIN 0.4 MG SL SUBL
0.4000 mg | SUBLINGUAL_TABLET | SUBLINGUAL | Status: DC | PRN
  Administered 2020-12-26: 0.4 mg via SUBLINGUAL
  Filled 2020-12-26: qty 1

## 2020-12-26 MED ORDER — LIDOCAINE VISCOUS HCL 2 % MT SOLN
15.0000 mL | Freq: Once | OROMUCOSAL | Status: AC
  Administered 2020-12-26: 15 mL via ORAL
  Filled 2020-12-26: qty 15

## 2020-12-26 MED ORDER — LABETALOL HCL 200 MG PO TABS
200.0000 mg | ORAL_TABLET | Freq: Once | ORAL | Status: AC
  Administered 2020-12-26: 200 mg via ORAL
  Filled 2020-12-26: qty 1

## 2020-12-26 MED ORDER — TORSEMIDE 20 MG PO TABS
40.0000 mg | ORAL_TABLET | Freq: Every day | ORAL | Status: DC
  Administered 2020-12-26: 40 mg via ORAL
  Filled 2020-12-26 (×2): qty 2

## 2020-12-26 MED ORDER — ALUM & MAG HYDROXIDE-SIMETH 200-200-20 MG/5ML PO SUSP
30.0000 mL | Freq: Once | ORAL | Status: AC
  Administered 2020-12-26: 30 mL via ORAL
  Filled 2020-12-26: qty 30

## 2020-12-26 MED ORDER — ISOSORBIDE MONONITRATE ER 60 MG PO TB24
240.0000 mg | ORAL_TABLET | Freq: Every day | ORAL | Status: DC
  Administered 2020-12-26: 240 mg via ORAL
  Filled 2020-12-26: qty 4

## 2020-12-26 MED ORDER — AMLODIPINE BESYLATE 5 MG PO TABS
10.0000 mg | ORAL_TABLET | Freq: Once | ORAL | Status: AC
  Administered 2020-12-26: 10 mg via ORAL
  Filled 2020-12-26: qty 2

## 2020-12-26 MED ORDER — FUROSEMIDE 10 MG/ML IJ SOLN
40.0000 mg | Freq: Once | INTRAMUSCULAR | Status: AC
  Administered 2020-12-26: 40 mg via INTRAVENOUS
  Filled 2020-12-26: qty 4

## 2020-12-26 NOTE — ED Triage Notes (Signed)
Per EMS, patient from home, c/o SOB sudden onset with chest pressure today. EMS reports PT coached through deep breathing helping with anxiety. States continued chest pressure but SOB has resolved.  BP 158/80 HR 67 O2 99%

## 2020-12-26 NOTE — ED Provider Notes (Signed)
Emergency Medicine Provider Triage Evaluation Note  [name] , a 80 y.o. female  was evaluated in triage.  Pt complains of cough and shortness of breath  Review of Systems  Positive: Cough and congestion Negative: fever  Physical Exam  BP (!) 188/58 (BP Location: Right Arm)   Pulse 61   Temp 97.6 F (36.4 C) (Oral)   Resp 16   SpO2 96%  Gen:   Awake, no distress   Resp:  Normal effort  MSK:   Moves extremities without difficulty  Other:    Medical Decision Making  Medically screening exam initiated at 1:14 PM.  Appropriate orders placed.  [name] was informed that the remainder of the evaluation will be completed by another provider, this initial triage assessment does not replace that evaluation, and the importance of remaining in the ED until their evaluation is complete.     Fransico Meadow, [place] 12/26/20 1315    Wyvonnia Dusky, MD 12/26/20 [phone]

## 2020-12-26 NOTE — Discharge Instructions (Signed)
[facility] today to be evaluated for your chest pressure and shortness of breath.  Your imaging showed that he had a small amount of fluid around each lung.  Due to this you were given your regular dose of torsemide and an increased dose of Lasix to help remove this fluid.  The remainder of your lab work was reassuring.  Please continue to take all of your prescribed medications.  Please follow-up with your cardiologist on Tuesday.  Get help right away if: Your chest pain gets worse. You have a cough that gets worse, or you cough up blood. You have severe pain in your abdomen. You faint. You have sudden, unexplained chest discomfort. You have sudden, unexplained discomfort in your arms, back, neck, or jaw. You have shortness of breath at any time. You suddenly start to sweat, or your skin gets clammy. You feel nausea or you vomit. You suddenly feel lightheaded or dizzy. You have severe weakness, or unexplained weakness or fatigue. Your heart begins to beat quickly, or it feels like it is skipping beats.

## 2020-12-26 NOTE — ED Notes (Signed)
Pt up to restroom, ambulating with steady gait

## 2020-12-26 NOTE — ED Notes (Signed)
Patient is resting comfortably. 

## 2020-12-26 NOTE — ED Notes (Signed)
Pt NAD, a/ox4. Pt verbalizes understanding of all DC and f/u instructions. All questions answered. Pt wheeled in w/c and assisted into vehicle.

## 2020-12-26 NOTE — ED Notes (Signed)
Pt back to room with ambulation, monitor system reapplied

## 2020-12-26 NOTE — ED Notes (Signed)
Pt lying fowlers in bed, tachypnic. Speaking appears more difficult, speaking in full sentences. A/ox4, states developed SOB last noc while [place] the [place] theater on her walker. Pt states sometimes this happens if she moves too fast but this felt different. +orthopnea last noc, 8/10 chest heaviness. LS clear bilaterally.

## 2020-12-26 NOTE — ED Provider Notes (Signed)
[facility] DEPT Provider Note   CSN: [id number] Arrival date & time: 12/26/20  1224     History Chief Complaint  Patient presents with   Shortness of Breath    [name] is a 80 y.o. female with a history of CKD, diabetes mellitus, CAD, CHF, retention, hyperlipidemia, status post TAVR, history of CVA.  Presents to the emergency department with a chief plaint of shortness of breath and chest pain.  Patient reports that shortness of breath started last night while ambulating.  Patient has had consistent shortness of breath since then.  Patient states that she woke up in the middle night with shortness of breath.  Shortness of breath is worse with laying flat and ambulation.  States that shortness of breath has continued through this morning however has gradually improved.  Patient reports chest pressure starting this morning upon waking at 0 600.  Pressure has been constant since then.  Patient states "it feels like something heavy is on my chest."  Chest pain is located to left chest and radiates to left jaw.  Patient rates pain 9/10 on the pain scale.  Patient denies any aggravating or alleviating factors.  No associated nausea, vomiting, diaphoresis, or palpitations.  Patient reports that she has had minimal leg swelling over the last 2 days.  Has had 3-5 pound weight increase.  Patient reports that she has been taking all of her prescribed medications.  Patient only took her prescribed hydralazine this morning.  Additionally patient endorses generalized fatigue and nonproductive cough.  No known sick contacts.  Denies any fevers, chills, rhinorrhea, nasal congestion, sore throat, hemoptysis, palpitations, abdominal pain, nausea, vomiting, diarrhea, lightheadedness, syncope.   Shortness of Breath Associated symptoms: cough   Associated symptoms: no abdominal pain, no chest pain, no fever, no headaches, no neck pain, no rash and no vomiting       Past  Medical History:  Diagnosis Date   Arthritis    Cerebrovascular disease    CKD (chronic kidney disease)    Sees Dr Hollie Salk   Coronary artery disease    Depression    Diabetes ([place])    INSULIN DEPENDENT   Diabetic peripheral neuropathy (HCC)    Diabetic retinopathy ([place])    Diverticulitis    GERD (gastroesophageal reflux disease)    History of CVA (cerebrovascular accident)    x 2 no residulal   Hyperlipidemia    Hypertension    Obesity    Renal lesion    S/P TAVR (transcatheter aortic valve replacement) 11/13/2018   s/p TAVR with a 28mm Edwards S3U via the TF approach by Dr. [name] and Dr. Angelena Form   Severe aortic stenosis     Patient Active Problem List   Diagnosis Date Noted   Treatment-emergent central sleep apnea 08/27/2020   Complex sleep apnea syndrome 08/27/2020   Need for prophylactic antibiotic 07/09/2020   CKD (chronic kidney disease), stage V ([place]) 07/09/2020   Pneumonia 06/25/2020   PVD (peripheral vascular disease) with claudication ([place]) 06/04/2020   Cardiovascular renal disease, stage 1-4 or unspecified chronic kidney disease, without heart failure 05/13/2020   SOB (shortness of breath) 05/12/2020   Cerebrovascular accident (CVA) due to embolism of right posterior cerebral artery ([place]) 05/11/2020   CHF (congestive heart failure) ([place]) 05/11/2020   Stable treated proliferative diabetic retinopathy of right eye without macular edema determined by examination associated with type 2 diabetes mellitus ([place]) 03/02/2020   Posterior vitreous detachment of right eye 03/02/2020   Anemia  02/28/2020   Acute on chronic renal failure ([place]) 02/28/2020   Hypokalemia    Diabetic peripheral neuropathy (HCC)    Benign essential HTN    Cerebral infarction due to embolism of right anterior cerebral artery ([place]) 02/19/2020   Ischemic stroke of frontal lobe ([place]) 02/17/2020   Cerebral embolism with cerebral infarction 02/12/2020   Thrombocytopenia ([place]) 02/10/2020   Acute  left-sided weakness 02/09/2020   Weakness 02/08/2020   Chronic heart failure with preserved ejection fraction ([place]) 01/02/2020   Atypical chest pain 05/31/2019   Central retinal vein occlusion, left eye, stable 05/29/2019   Central retinal vein occlusion of left eye 05/28/2019   Proliferative diabetic retinopathy of left eye with macular edema associated with type 2 diabetes mellitus ([place]) 05/28/2019   Acute on chronic combined systolic and diastolic CHF (congestive heart failure) ([place]) 11/13/2018   S/P TAVR (transcatheter aortic valve replacement) 11/13/2018   Essential hypertension    Hyperlipidemia    Renal lesion    Severe aortic stenosis 10/23/2018   CKD (chronic kidney disease), stage IV ([place]) 12/05/2017   Coronary artery disease of native artery of native heart with stable angina pectoris ([place]) 01/07/2014   S/P PTCA (percutaneous transluminal coronary angioplasty) 01/07/2014   History of ischemic anterior cerebral artery stroke 01/30/2013    Past Surgical History:  Procedure Laterality Date   ABDOMINAL HYSTERECTOMY     AV FISTULA PLACEMENT Left 12/18/2017   Procedure: ARTERIOVENOUS (AV) FISTULA CREATION ARM;  Surgeon: Marty Heck, MD;  Location: [facility];  Service: Vascular;  Laterality: Left;   bilateral cataract surgery     CARDIAC CATHETERIZATION  01/07/2014   DR Einar Gip   COLON RESECTION  02/1999   COLONOSCOPY     COLOSTOMY  02/1999   COLOSTOMY CLOSURE  07/1999   EYE SURGERY Left 2019   FISTULA SUPERFICIALIZATION Left 06/29/2018   Procedure: FISTULA SUPERFICIALIZATION LEFT BRACHIOCEPHALIC;  Surgeon: Marty Heck, MD;  Location: [facility];  Service: Vascular;  Laterality: Left;   LEFT HEART CATHETERIZATION WITH CORONARY ANGIOGRAM N/A 01/07/2014   Procedure: LEFT HEART CATHETERIZATION WITH CORONARY ANGIOGRAM;  Surgeon: Laverda Page, MD;  Location: [facility] CATH LAB;  Service: Cardiovascular;  Laterality: N/A;   LOOP RECORDER INSERTION N/A 02/17/2020   Procedure: LOOP  RECORDER INSERTION;  Surgeon: Deboraha Sprang, MD;  Location: [facility] CV LAB;  Service: Cardiovascular;  Laterality: N/A;   middle cerebral artery stent placement Right    OTHER SURGICAL HISTORY     laser surgery   PTCA  01/07/2014   DES to RCA    DR Einar Gip   right knee surgery Right    for infection   RIGHT/LEFT HEART CATH AND CORONARY ANGIOGRAPHY N/A 10/23/2018   Procedure: RIGHT/LEFT HEART CATH AND CORONARY ANGIOGRAPHY;  Surgeon: Nigel Mormon, MD;  Location: [facility] CV LAB;  Service: Cardiovascular;  Laterality: N/A;   TEE WITHOUT CARDIOVERSION N/A 11/13/2018   Procedure: TRANSESOPHAGEAL ECHOCARDIOGRAM (TEE);  Surgeon: Burnell Blanks, MD;  Location: [facility];  Service: Open Heart Surgery;  Laterality: N/A;   TRANSCATHETER AORTIC VALVE REPLACEMENT, TRANSFEMORAL  11/13/2018   TRANSCATHETER AORTIC VALVE REPLACEMENT, TRANSFEMORAL N/A 11/13/2018   Procedure: TRANSCATHETER AORTIC VALVE REPLACEMENT, TRANSFEMORAL;  Surgeon: Burnell Blanks, MD;  Location: [facility];  Service: Open Heart Surgery;  Laterality: N/A;     OB History   No obstetric history on file.     Family History  Problem Relation Age of Onset   Diabetes Mother    Heart  disease Mother    Prostate cancer Father    Hypertension Brother    Prostate cancer Brother     Social History   Tobacco Use   Smoking status: Never   Smokeless tobacco: Never  Vaping Use   Vaping Use: Never used  Substance Use Topics   Alcohol use: No   Drug use: No    Home Medications Prior to Admission medications   Medication Sig Start Date End Date Taking? Authorizing Provider  acetaminophen (TYLENOL) 325 MG tablet Take 650 mg by mouth every 6 (six) hours as needed for moderate pain or headache.     [provider]  allopurinol (ZYLOPRIM) 100 MG tablet Take 50 mg by mouth daily.    [provider]  amLODipine (NORVASC) 10 MG tablet TAKE 1 TABLET BY MOUTH EVERY DAY 10/30/20   Patwardhan, Manish J,  MD  amoxicillin-clavulanate (AUGMENTIN) 875-125 MG tablet Take 1 tablet by mouth every 12 (twelve) hours. 10/04/20   Kommor, Debe Coder, MD  aspirin EC 81 MG tablet Take 1 tablet (81 mg total) by mouth daily. Swallow whole. 02/17/20 02/16/21  Katsadouros, Vasilios, MD  brimonidine (ALPHAGAN) 0.2 % ophthalmic solution Place 1 drop into the left eye daily. 04/19/19   [provider]  calcitRIOL (ROCALTROL) 0.25 MCG capsule Take 0.25 mcg by mouth daily.     [provider]  Evolocumab (REPATHA SURECLICK) 956 MG/ML SOAJ Inject 1 mL into the skin every 14 (fourteen) days. 11/03/20   Patwardhan, Reynold Bowen, MD  ezetimibe (ZETIA) 10 MG tablet Take 10 mg by mouth daily. 04/22/20   [provider]  ferrous sulfate 325 (65 FE) MG tablet Take 325 mg by mouth daily with breakfast.    [provider]  folic acid (FOLVITE) 1 MG tablet Take 1 mg by mouth daily. 12/07/12   [provider]  HUMALOG KWIKPEN 100 UNIT/ML KwikPen Inject 4-8 Units into the skin See admin instructions. Inject 4-8 units into the skin 3 times a day before meals, per sliding scale 05/28/18   [provider]  hydrALAZINE (APRESOLINE) 50 MG tablet Take 1 tablet (50 mg total) by mouth every 8 (eight) hours. 02/26/20   Love, Ivan Anchors, PA-C  insulin glargine (LANTUS) 100 UNIT/ML injection Inject 0.09 mLs (9 Units total) into the skin at bedtime. 06/27/20   Madalyn Rob, MD  isosorbide mononitrate (IMDUR) 120 MG 24 hr tablet Take 2 tablets (240 mg total) by mouth daily. 11/05/20   Patwardhan, Reynold Bowen, MD  labetalol (NORMODYNE) 200 MG tablet Take 1 tablet (200 mg total) by mouth 2 (two) times daily. 11/05/20   Patwardhan, Reynold Bowen, MD  polyvinyl alcohol (LIQUIFILM TEARS) 1.4 % ophthalmic solution Place 1 drop into both eyes 3 (three) times daily as needed for dry eyes.    [provider]  senna-docusate (SENOKOT-S) 8.6-50 MG tablet Take 2 tablets by mouth 2 (two) times daily. 02/26/20   Love, Ivan Anchors,  PA-C  torsemide (DEMADEX) 20 MG tablet Take 2 tablets (40 mg total) by mouth daily. 02/26/20   Love, Ivan Anchors, PA-C  venlafaxine XR (EFFEXOR-XR) 150 MG 24 hr capsule Take 1 capsule (150 mg total) by mouth daily. 02/27/20   Bary Leriche, PA-C    Allergies    Patient has no known allergies.  Review of Systems   Review of Systems  Constitutional:  Positive for fatigue. Negative for chills and fever.  Eyes:  Negative for visual disturbance.  Respiratory:  Positive for cough and shortness of  breath.   Cardiovascular:  Positive for leg swelling. Negative for chest pain and palpitations.  Gastrointestinal:  Negative for abdominal pain, diarrhea, nausea and vomiting.  Genitourinary:  Negative for difficulty urinating and dysuria.  Musculoskeletal:  Negative for back pain and neck pain.  Skin:  Negative for color change and rash.  Neurological:  Negative for dizziness, syncope, light-headedness and headaches.  Psychiatric/Behavioral:  Negative for confusion.    Physical Exam Updated Vital Signs BP (!) 188/58 (BP Location: Right Arm)   Pulse 61   Temp 97.6 F (36.4 C) (Oral)   Resp 16   SpO2 96%   Physical Exam Vitals and nursing note reviewed.  Constitutional:      General: She is not in acute distress.    Appearance: She is not ill-appearing, toxic-appearing or diaphoretic.  HENT:     Head: Normocephalic.  Eyes:     General: No scleral icterus.       Right eye: No discharge.        Left eye: No discharge.  Neck:     Vascular: No JVD.  Cardiovascular:     Rate and Rhythm: Normal rate.     Pulses:          Radial pulses are 2+ on the right side and 2+ on the left side.     Heart sounds: Murmur heard.  Pulmonary:     Effort: Pulmonary effort is normal. No tachypnea, bradypnea or respiratory distress.     Breath sounds: Examination of the right-lower field reveals rales. Examination of the left-lower field reveals rales. Rales present.     Comments: Subtle rales to bilateral  lower lobes Abdominal:     General: There is no distension. There are no signs of injury.     Palpations: Abdomen is soft. There is no mass or pulsatile mass.     Tenderness: There is no abdominal tenderness. There is no guarding or rebound.  Musculoskeletal:     Cervical back: Neck supple.     Right lower leg: 1+ Edema present.     Left lower leg: 1+ Edema present.  Skin:    General: Skin is warm and dry.  Neurological:     General: No focal deficit present.     Mental Status: She is alert.  Psychiatric:        Behavior: Behavior is cooperative.    ED Results / Procedures / Treatments   Labs (all labs ordered are listed, but only abnormal results are displayed) Labs Reviewed  CBC WITH DIFFERENTIAL/PLATELET - Abnormal; Notable for the following components:      Result Value   Hemoglobin 11.1 (*)    HCT 35.6 (*)    All other components within normal limits  COMPREHENSIVE METABOLIC PANEL - Abnormal; Notable for the following components:   CO2 21 (*)    Glucose, Bld 132 (*)    BUN 51 (*)    Creatinine, Ser 3.28 (*)    GFR, Estimated 14 (*)    All other components within normal limits  BRAIN NATRIURETIC PEPTIDE - Abnormal; Notable for the following components:   B Natriuretic Peptide 632.8 (*)    All other components within normal limits  TROPONIN I (HIGH SENSITIVITY) - Abnormal; Notable for the following components:   Troponin I (High Sensitivity) 21 (*)    All other components within normal limits  TROPONIN I (HIGH SENSITIVITY) - Abnormal; Notable for the following components:   Troponin I (High Sensitivity) 25 (*)  All other components within normal limits  RESP PANEL BY RT-PCR (FLU A&B, COVID) [id number]    EKG EKG Interpretation  Date/Time:  Saturday December 26 2020 15:30:04 EST Ventricular Rate:  63 PR Interval:  211 QRS Duration: 102 QT Interval:  514 QTC Calculation: 527 R Axis:   59 Text Interpretation: Sinus rhythm Consider anterior infarct Prolonged QT  interval Confirmed by Godfrey Pick [id number]) on 12/26/2020 3:42:01 PM  Radiology DG Chest Portable 1 View  Result Date: 12/26/2020 CLINICAL DATA:  Cough EXAM: PORTABLE CHEST 1 VIEW COMPARISON:  Chest x-ray 10/04/2020 FINDINGS: Heart is enlarged. Mediastinum appears grossly stable. Cardiac surgical changes. Pulmonary vascular congestion with no definite focal consolidation identified. Likely trace pleural effusions and trace fluid along the right minor fissure. No pneumothorax. IMPRESSION: Evidence of CHF as described. Electronically Signed   By: Ofilia Neas [name]   On: 12/26/2020 14:03    Procedures Procedures   Medications Ordered in ED Medications  torsemide (DEMADEX) tablet 40 mg (40 mg Oral Given 12/26/20 1633)  nitroGLYCERIN (NITROSTAT) SL tablet 0.4 mg (0.4 mg Sublingual Given 12/26/20 1532)  isosorbide mononitrate (IMDUR) 24 hr tablet 240 mg (240 mg Oral Given 12/26/20 1718)  alum & mag hydroxide-simeth (MAALOX/MYLANTA) 200-200-20 MG/5ML suspension 30 mL (30 mLs Oral Given 12/26/20 1633)    And  lidocaine (XYLOCAINE) 2 % viscous mouth solution 15 mL (15 mLs Oral Given 12/26/20 1633)  amLODipine (NORVASC) tablet 10 mg (10 mg Oral Given 12/26/20 1633)  labetalol (NORMODYNE) tablet 200 mg (200 mg Oral Given 12/26/20 1718)  furosemide (LASIX) injection 40 mg (40 mg Intravenous Given 12/26/20 1854)    ED Course  I have reviewed the triage vital signs and the nursing notes.  Pertinent labs & imaging results that were available during my care of the patient were reviewed by me and considered in my medical decision making (see chart for details).  Clinical Course as of 12/27/20 0129  Sat Dec 26, 2020  1818 Spoke to Dr. Einar Gip with [facility] cardiology who recommends additional 40 mg of Lasix.  Second troponin does not show significant increase plan to discharge and follow-up with his office on Tuesday. [PB]    Clinical Course User Index [PB] Dyann Ruddle   MDM  Rules/Calculators/A&P                           Alert 80 year old female no acute distress, nontoxic-appearing.  Presents emergency department complaining of shortness of breath and chest pain.  Shortness of breath began yesterday evening and chest pain again this morning upon waking.   Patient reports that she has appointment with cardiologist on 12/13  Due to patient's symptoms and cardiac history will add on troponin, BNP, and EKG.  We will give patient nitroglycerin for her chest pain.  Chest x-ray shows trace pleural effusions and trace fluid along the right minor fissure.  Patient has not taken her prescribed torsemide today.  We will give patient her prescribed dose of torsemide.  Disorder panel negative for COVID-19 and influenza. CBC shows mild anemia with hemoglobin 11.1 and hematocrit 35.6 CMP shows BUN elevated 51 creatinine elevated 3.28.  Patient has history of CKD and creatinine appears to fluctuate between 2-5.  Patient's initial troponin slightly elevated at 21 will obtain delta troponin. BNP slightly elevated at 632.  Spoke to cardiologist Dr. Einar Gip who recommended additional 40 mg IV Lasix.  If delta troponin shows no significant increase plan to follow-up in  outpatient setting.  Patient noted to be hypertensive on emergency department.  Patient given home medications with improvement in her hypertension.  Second opponent 25 with delta of +4.  Patient hemodynamically stable at this time.  Patient reports improvement in her shortness of breath and chest pressure.  Will discharge at this time.  Patient care discussed with attending physician Dr. Doren Custard.  Discussed results, findings, treatment and follow up. Patient advised of return precautions. Patient verbalized understanding and agreed with plan.   Final Clinical Impression(s) / ED Diagnoses Final diagnoses:  Chest pain, unspecified type  SOB (shortness of breath)    Rx / DC Orders ED Discharge Orders     None         Dyann Ruddle 12/27/20 0133    Godfrey Pick, MD 12/28/20 [phone]

## 2020-12-29 ENCOUNTER — Other Ambulatory Visit: Payer: Self-pay

## 2020-12-29 ENCOUNTER — Ambulatory Visit: Payer: Medicare PPO

## 2020-12-29 DIAGNOSIS — I5032 Chronic diastolic (congestive) heart failure: Secondary | ICD-10-CM

## 2020-12-29 DIAGNOSIS — Z952 Presence of prosthetic heart valve: Secondary | ICD-10-CM

## 2021-01-01 ENCOUNTER — Ambulatory Visit: Payer: Medicare PPO | Admitting: Cardiology

## 2021-01-05 ENCOUNTER — Other Ambulatory Visit: Payer: Medicare PPO

## 2021-01-05 NOTE — Progress Notes (Signed)
Seeing you tomorrow

## 2021-01-06 ENCOUNTER — Other Ambulatory Visit: Payer: Self-pay

## 2021-01-06 ENCOUNTER — Ambulatory Visit: Payer: Medicare PPO | Admitting: Cardiology

## 2021-01-06 ENCOUNTER — Encounter: Payer: Self-pay | Admitting: Cardiology

## 2021-01-06 VITALS — BP 151/55 | HR 56 | Temp 98.0°F | Resp 16 | Ht 63.0 in | Wt 188.0 lb

## 2021-01-06 DIAGNOSIS — N185 Chronic kidney disease, stage 5: Secondary | ICD-10-CM

## 2021-01-06 DIAGNOSIS — I10 Essential (primary) hypertension: Secondary | ICD-10-CM

## 2021-01-06 DIAGNOSIS — Z952 Presence of prosthetic heart valve: Secondary | ICD-10-CM

## 2021-01-06 DIAGNOSIS — I25118 Atherosclerotic heart disease of native coronary artery with other forms of angina pectoris: Secondary | ICD-10-CM

## 2021-01-06 NOTE — Progress Notes (Signed)
Virtual visit  Subjective:   [name], female    DOB: 1940-04-19, 80 y.o.   MRN: [id number]  HPI   Chief Complaint  Patient presents with   Coronary Artery Disease   Follow-up    1 year    80 y.o. African American female with hypertension, hyperlipidemia, diabetes with CKD stage V, CAD, h/o strokes (2015, 2020), s/p TAVR for severe AS (11/2018), now with chest pain  Patient had a recent ER visit with shortness of breath symptoms.  She was found to be hypertensive with systolic blood pressure as high as 190 mmHg.  BNP and troponin were mildly elevated, but not suggestive of ACS.  Subsequently, blood pressure improved and she was discharged from the ER.  Patient does not have any new symptoms today.  She has regular follow-up with nephrology.  Her EGFR has remained stable 10-15.  Reviewed recent echocardiogram results with patient, details below.  Current Outpatient Medications on File Prior to Visit  Medication Sig Dispense Refill   acetaminophen (TYLENOL) 325 MG tablet Take 650 mg by mouth every 6 (six) hours as needed for moderate pain or headache.      allopurinol (ZYLOPRIM) 100 MG tablet Take 50 mg by mouth daily.     amLODipine (NORVASC) 10 MG tablet TAKE 1 TABLET BY MOUTH EVERY DAY 90 tablet 3   amoxicillin-clavulanate (AUGMENTIN) 875-125 MG tablet Take 1 tablet by mouth every 12 (twelve) hours. 14 tablet 0   aspirin EC 81 MG tablet Take 1 tablet (81 mg total) by mouth daily. Swallow whole. 150 tablet 2   brimonidine (ALPHAGAN) 0.2 % ophthalmic solution Place 1 drop into the left eye daily.     Evolocumab (REPATHA SURECLICK) 854 MG/ML SOAJ Inject 1 mL into the skin every 14 (fourteen) days. 2 mL 3   ezetimibe (ZETIA) 10 MG tablet Take 10 mg by mouth daily.     ferrous sulfate 325 (65 FE) MG tablet Take 325 mg by mouth daily with breakfast.     HUMALOG KWIKPEN 100 UNIT/ML KwikPen Inject 4-8 Units into the skin See admin instructions. Inject 4-8 units into the skin 3 times  a day before meals, per sliding scale     hydrALAZINE (APRESOLINE) 50 MG tablet Take 1 tablet (50 mg total) by mouth every 8 (eight) hours. 90 tablet 0   insulin glargine (LANTUS) 100 UNIT/ML injection Inject 0.09 mLs (9 Units total) into the skin at bedtime.     isosorbide mononitrate (IMDUR) 120 MG 24 hr tablet Take 2 tablets (240 mg total) by mouth daily. 180 tablet 3   labetalol (NORMODYNE) 200 MG tablet Take 1 tablet (200 mg total) by mouth 2 (two) times daily. 180 tablet 3   polyvinyl alcohol (LIQUIFILM TEARS) 1.4 % ophthalmic solution Place 1 drop into both eyes 3 (three) times daily as needed for dry eyes.     senna-docusate (SENOKOT-S) 8.6-50 MG tablet Take 2 tablets by mouth 2 (two) times daily. 120 tablet 0   torsemide (DEMADEX) 20 MG tablet Take 2 tablets (40 mg total) by mouth daily. 60 tablet 0   venlafaxine XR (EFFEXOR-XR) 150 MG 24 hr capsule Take 1 capsule (150 mg total) by mouth daily. 30 capsule 0   Current Facility-Administered Medications on File Prior to Visit  Medication Dose Route Frequency Provider Last Rate Last Admin   Evolocumab SOAJ 140 mg  140 mg Subcutaneous Once Cantwell, Celeste C, PA-C        Cardiovascular & other pertient studies:  EKG 12/26/2020: Sinus rhythm 63 bpm. Nonspecific ST-T changes. Prolonged QTc interval  Echocardiogram 12/29/2020:  Normal LV systolic function with visual EF 55-60%. Left ventricle cavity  is normal in size. Mild left ventricular hypertrophy. Normal global wall  motion. Doppler evidence of grade III (restrictive) diastolic dysfunction,  elevated LAP.  23 mm Sapien prosthetic (TAVR) valve (implanted 11/13/2018) is  well-seated, no evidence of dehiscence, no perivalvular regurgitation.  Moderate (Grade II) mitral regurgitation.  Mild tricuspid regurgitation. Mild pulmonary hypertension. RVSP measures  37 mmHg.  Moderate pulmonic regurgitation.  Insignificant pericardial effusion. There is no hemodynamic significance.   Compared to study 05/12/2020 LVEF remains preserved, G2DD is now G3DD,  Trivial MR is now moderate, TR / PHTN / PR are new findings compared to  prior study.   RHC/LHC 10/2018: RA: 4 mmHg RV: 54/9 mmHg PA: 54/17 mmHg, mean PAP 34 mmhg. PCWP: 25 mmHg with tall V wave   CO: 5.2 L/min CI: 3 L/min/m2   LM: Normal LAD: Normal LCx:: Normal RCA: Ostial calcific at least 60% stenosis. Ostial RPDA 20% stenosis.    Right coronary artery was difficult to engage. I had to use multiple catheters, and was finally able to engage with [facility] catheter. there was dampnening of the pressure with catheter engagement. Ostium is at least 60% stenosed with moderate calcification. There was no signifciant improvement with IC NTG. I did not perform FFR in order to limit contrast use in this patient with Cr of 3.0, not on dialysis.    Recommendation: Will discuss with structural valve clinic. If functional evaluation of RCA is felt necesary prior to TAVR/SAVR, would consider non-invasive stress test.   Bilateral renal artery ultrasound 05/14/2020: Right: Normal size right kidney. Abnormal right Resistive Index. RRV         flow present.  Left:  Normal size of left kidney. Abnormal left Resisitve Index.         LRV flow present. Cyst(s) noted.   Echocardiogram 05/12/2020:  1. Left ventricular ejection fraction, by estimation, is 60 to 65%. The  left ventricle has normal function. The left ventricle has no regional  wall motion abnormalities. Left ventricular diastolic parameters are  consistent with Grade II diastolic  dysfunction (pseudonormalization). Elevated left atrial pressure.   2. Right ventricular systolic function is normal. The right ventricular  size is normal. There is normal pulmonary artery systolic pressure.   3. The mitral valve is normal in structure. Trivial mitral valve  regurgitation. No evidence of mitral stenosis.   4. The aortic valve has been repaired/replaced. Aortic valve   regurgitation is not visualized. No aortic stenosis is present. There is a  23 mm Sapien prosthetic (TAVR) valve present in the aortic position.  Procedure Date: 11/13/2018. Echo findings are  consistent with normal structure and function of the aortic valve  prosthesis.   5. The inferior vena cava is normal in size with greater than 50%  respiratory variability, suggesting right atrial pressure of 3 mmHg.  Echocardiogram 12/23/2019:  Left ventricle cavity is normal in size. Moderate concentric hypertrophy  of the left ventricle. Normal global wall motion. Normal LV systolic  function with EF 56%. Normal diastolic filling pattern.  Left atrial cavity is mildly dilated.  Well seated Wende Crease Sapien 3 bioprosthetic aortic valve with  normal functioning. Mean PG 11 mmHg. No valvular regurgitation or  paravalvular leak.  Mild (Grade I) mitral regurgitation.  Mild tricuspid regurgitation. Estimated pulmonary artery systolic pressure  31 mmHg.  Mild pulmonic regurgitation.  No significant change compared to previous study on 12/11/2018.  EKG 01/02/2020: Sinus rhythm 67 bpm Poor R-wave progression Low voltage Nonspecific T-abnormality  Lexiscan (Walking with mod Bruce)Tetrofosmin Stress Test  06/25/2019: Nondiagnostic ECG stress.  The baseline blood pressure was 130/80 mmHg. Maximum blood pressure post injection was 200/70 mmHg, which is a hypertensive response to Lexiscan with Treadmill.  Myocardial perfusion is normal. Overall LV systolic function is normal without regional wall motion abnormalities. Stress LV EF: 61%.  No significant change from 08/06/2018. Low risk.   EKG 05/31/2019: Sinus rhythm 65 bpm Normal EKG   Right and left heart cath 10/23/2018:  RA: 4 mmHg RV: 54/9 mmHg PA: 54/17 mmHg, mean PAP 34 mmhg. PCWP: 25 mmHg with tall V wave   CO: 5.2 L/min CI: 3 L/min/m2   LM: Normal LAD: Normal LCx:: Normal RCA: Ostial calcific at least 60% stenosis. Ostial  RPDA 20% stenosis.    Right coronary artery was difficult to engage. I had to use multiple catheters, and was finally able to engage with [facility] catheter. there was dampnening of the pressure with catheter engagement. Ostium is at least 60% stenosed with moderate calcification. There was no signifciant improvement with IC NTG. I did not perform FFR in order to limit contrast use in this patient with Cr of 3.0, not on dialysis.  MRA of head 09/10/2018:  1. Acute infarct right occipital lobe. No associated hemorrhage in the infarct. Chronic microhemorrhage right parietal white matter appears separate. 2. Right M1 stent obscures evaluation of the right middle cerebral artery on MRA 3. Right posterior cerebral artery patent. 4. Occluded distal P3 branch on the left.   Carotid artery duplex 12/04/2013: No evidence of hemodynamically significant stenosis in the bilateral carotid bifurcation vessels. There is evidence of minimal heterogeneous plaque in the bilateral carotid artery.  Recent labs: 12/26/2020: Glucose 132, BUN/Cr 51/3.28. EGFR 14. Na/K 139/4.5. Rest of the CMP normal H/H 11/35. MCV 89. Platelets 258 Chol 171, TG 113, HDL 60, LDL 91 BNP 632 Trop HS 21, 25  08/13/2020: Glucose 135, BUN 45, creatinine 2.49, GFR 19, sodium 140, potassium 4.9 Hgb 11, HCT 32.7, MCV 83, platelet 218  06/27/2020: Glucose 159, BUN/Cr 99/5.46. EGFR 7. Na/K 134/3.5. AST/ALT 104/51. Total protein 5.8. Albumin 2.7. H/H 9/27. MCV 86. Platelets 196  05/14/2020: Hemoglobin 9.3, hematocrit 28.9, MCV 90.9, platelet 213 Sodium 136, potassium 4.1, glucose 95, BUN 49, creatinine 3.55, AST 23, ALT 15, alk phos 65, GFR 12  05/11/2020: TSH 3.147 A1c 7.3% BNP 302  02/11/2020: Total cholesterol 296, HDL 67, LDL 199, triglycerides 148  10/24/2019: Glucose 122. BUN/Cr 35/2.  05/03/2019:  Glucose 252, BUN/Cr 51/2.69. EGFR 19. Na/K 138/4.2. Rest of the CMP normal. H/H 10.7/33. MCV 90. Platelets 261  Chol 145, TG  154, HDL 45, LDL 74.  NTproBNP 615.   Uric Acid 8.3 (H)  11/09/2018: HbA1C 7.6%  03/06/2018: TSH 1.82 normal   Review of Systems  Constitutional: Negative for malaise/fatigue and weight gain.  Cardiovascular:  Positive for chest pain. Negative for claudication, near-syncope, orthopnea and paroxysmal nocturnal dyspnea.  Respiratory:  Negative for shortness of breath.   Hematologic/Lymphatic: Does not bruise/bleed easily.  Gastrointestinal:  Negative for melena.  Neurological:  Negative for dizziness and weakness.       Vitals:   01/06/21 1311  BP: (!) 151/55  Pulse: (!) 56  Resp: 16  Temp: 98 F (36.7 C)  SpO2: 95%    Body mass index is 33.3 kg/m. Filed Weights  01/06/21 1311  Weight: 188 lb (85.3 kg)     Objective:   Physical Exam Vitals and nursing note reviewed.  Constitutional:      General: She is not in acute distress.    Appearance: She is well-developed.  Cardiovascular:     Rate and Rhythm: Normal rate and regular rhythm.     Pulses: Intact distal pulses.     Heart sounds: S1 normal and S2 normal. Murmur heard.  Harsh midsystolic murmur is present with a grade of 1/6 at the upper right sternal border radiating to the neck.    No gallop.  Pulmonary:     Effort: Pulmonary effort is normal.     Breath sounds: Normal breath sounds.  Musculoskeletal:     Right lower leg: Edema (1+) present.     Left lower leg: Edema (1+) present.  Neurological:     Mental Status: She is alert.      Assessment & Recommendations:   80 y.o. African American female with hypertension, hyperlipidemia, diabetes with CKD stage V, CAD, h/o strokes (2015, 2020), s/p TAVR for severe AS (11/2018), now with chest pain  CAD: Known CAD with moderate ostial RCA disease on pre-TAVR cath in 2020.   Angina symptoms are fairly well controlled, except in situations of hypertensive urgency. Recent mild troponin and BNP elevation due to hypertensive urgency. No changes made today  to her antihypertensive and antianginal therapy.  Options are limited as she is on maximal doses of amlodipine, BiDil, heart rate in 50s on the below 200 twice daily.  Clonidine is not ideal given her resting bradycardia on labetalol. Continue follow-up with nephrology. Follow-up in 4 weeks  S/P TAVR (transcatheter aortic valve replacement): Normal functioning bioprosthetic aortic valve (Echocardiogram 12/2019). Continue Aspirin 81 mg daily. Not functioning well on echocardiogram 12/2020. Diastolic dysfunction due to uncontrolled hypertension, advanced CKD.  Chronic kidney disease (CKD), stage V: F/u w/Dr/. Upton  F/u in 6 months    Nigel Mormon, PA-C 01/06/2021, 12:50 PM Office: [phone]

## 2021-01-07 ENCOUNTER — Encounter: Payer: Self-pay | Admitting: Cardiology

## 2021-02-04 ENCOUNTER — Other Ambulatory Visit: Payer: Self-pay | Admitting: Cardiology

## 2021-02-04 ENCOUNTER — Ambulatory Visit
Admission: RE | Admit: 2021-02-04 | Discharge: 2021-02-04 | Disposition: A | Discharge status: Home / Self Care | Payer: Medicare PPO | Source: Ambulatory Visit | Attending: Internal Medicine | Admitting: Internal Medicine

## 2021-02-04 ENCOUNTER — Other Ambulatory Visit: Payer: Self-pay

## 2021-02-04 ENCOUNTER — Other Ambulatory Visit: Payer: Self-pay | Admitting: Student

## 2021-02-04 DIAGNOSIS — R918 Other nonspecific abnormal finding of lung field: Secondary | ICD-10-CM

## 2021-02-19 ENCOUNTER — Ambulatory Visit: Payer: Medicare PPO | Admitting: Student

## 2021-03-03 ENCOUNTER — Other Ambulatory Visit: Payer: Self-pay | Admitting: Student

## 2021-03-10 ENCOUNTER — Telehealth: Payer: Self-pay

## 2021-03-10 NOTE — Telephone Encounter (Signed)
I spoke with the patient and she is now being seen by Patwardhan.

## 2021-03-25 ENCOUNTER — Other Ambulatory Visit: Payer: Self-pay

## 2021-03-25 ENCOUNTER — Encounter: Payer: Self-pay | Admitting: Neurology

## 2021-03-25 ENCOUNTER — Ambulatory Visit (INDEPENDENT_AMBULATORY_CARE_PROVIDER_SITE_OTHER): Payer: Medicare PPO | Admitting: Neurology

## 2021-03-25 VITALS — BP 150/59 | HR 51 | Ht 63.0 in | Wt 169.0 lb

## 2021-03-25 DIAGNOSIS — G4731 Primary central sleep apnea: Secondary | ICD-10-CM | POA: Diagnosis not present

## 2021-03-25 DIAGNOSIS — Z789 Other specified health status: Secondary | ICD-10-CM | POA: Diagnosis not present

## 2021-03-25 DIAGNOSIS — Z91199 Patient's noncompliance with other medical treatment and regimen due to unspecified reason: Secondary | ICD-10-CM | POA: Diagnosis not present

## 2021-03-25 NOTE — Progress Notes (Signed)
SLEEP MEDICINE CLINIC    Provider:  Larey Seat, MD  Primary Care Physician:  Audley Hose, MD [place] [id number]     Referring Provider: Jannifer Franklin, MD  and CC to  Dr Virgina Jock, MD   Chief Complaint according to patient   Patient presents with:     New Patient (Initial Visit)           HISTORY OF Interval: 03-25-2021: This patient was referred for an evaluation of central apnea in the context of congestive heart failure, had repeated strokes.  [name] was referred in 2022 following hospitalization as a stroke service.  She had known to have obstructive sleep apnea since the year 2004 had a sleep study on 5-5 2022 which showed mild apnea her AHI was 17/h but she had central apnea she also was very much dependent on supine sleep apnea was worse in that position there were mixed and central apneas existing together.  And for this reason she was asked to return for an in lab titration.  She was fitted with a small size fullface mask Vitera by Gaylyn Cheers C attending at night, CPAP was initiated at 5 and advanced to 9 cmH2O but she did not respond to CPAP with a significant benefit the lowest AHI was 8.6 on the CPAP pressure of 8 cm and Mr. Tommie Raymond therefore switched her to BiPAP she tried 14/10 16 over 12 cm water pressure also without success they actually made her apnea worse.  Then she was then advanced to BiPAP ST again no significant success and then to ASV.  Under ASV her AHI was reduced 15 over 6/3 cmH2O work with an AHI of 3.7 and 15 over 7/3 reached an AHI of 0 both also corrected hypoxia.  The last setting however was only sleep tested for about 16-1/2 minutes.  Today is my first follow-up for compliance with the patient and her ASV machine and there has been no data accessible on her ASV machine and this means the machine was not used.  The patient states that she has tried 1 night to use it and could not she could not fall asleep visit  she was very claustrophobic and it made her feel suffocated.  I have data from 2004 when the patient also tried a while, and d/c using the device.  The ASV is a very expensive special devise. Auto servo ventilation- and the patient is not using it , not willing to use it.   She will return the device to the DME.    I have asked her to follow up with cardiology, she would not need to follow up with me, or neurology. Her strokes are no longer actively investigated and her prevention of future strokes will be handled by her internist.      05-11-2020 [name] is a 81 - year old  African- American female patient of Dr. Jannifer Franklin was recently admitted again to hospital for another stroke-  and is seen here upon his referral  for a sleep apnea evaluation- she a is also a patient o dr Zadie Rhine, had "eye strokes" and proliferative diabetic retinopathy. She reports the back of the ey was swellling and she requires daily eye drops.  Chief concern according to patient : see above. Last visit with Dr Jannifer Franklin was in March 2022, last hospitalization jan 22- feb 9th 22. He initiated referral , did not see patient since.  [name]  has a past medical history of Arthritis, Cerebrovascular disease, CKD (chronic kidney disease), Coronary artery disease, Depression, INSULIN dependent type 2  Diabetes ([place]), Diabetic peripheral neuropathy ([place]), Diabetic retinopathy ([place]), Diverticulitis, GERD (gastroesophageal reflux disease), History of CVA (cerebrovascular accident), Hyperlipidemia, Hypertension, Obesity, Renal lesion, S/P TAVR (transcatheter aortic valve replacement) (11/13/2018), and Severe aortic stenosis and uncontrolled hypertension. .   The patient had the first sleep study in the year 2004 or 2005,  with a result of an AHI ( Apnea Hypopnea index) of OSA.   Sleep relevant medical history: Nocturia 2 -3 times. Recurrent sinusitis , nasal drip, no tonsillectomy. DM 2with multiple manifestation,  complicated, insulin dependent.    Family medical /sleep history: many other family members with DM, Strokes, CAD, nobody  on CPAP with OSA. Mother, father and brother had heart disease     Social history:  Patient is retired from Glass blower/designer for [employer], and lives in a household with alone, with a dog,  a daughter and granddaughter live close.  Family status is widowed, with adult children.  Tobacco use- none .  ETOH use - none , Caffeine intake in form of Coffee( 2 cups a day) Soda( /) Tea ( some ) no energy drinks. Regular exercise- not established .  Sleep habits are as follows: The patient's dinner time is between 6 PM. The patient goes to bed at 10-11 PM and is asleep promptly, continues to sleep for 3 hours, wakes for 3-4  bathroom breaks, the first time at 2 AM.   The preferred sleep position is sideways, with the support of 2 pillows.  Dreams are reportedly frequent.  6.30 AM is the usual rise time for med intake- sometimes she will go to bed, up by 9 AM. The patient wakes up spontaneously  She reports not feeling refreshed or restored in AM, with symptoms such as  lightheadedness and residual fatigue. Naps are taken frequently, lasting from 20-40 minutes and are more refreshing than nocturnal sleep.    Review of Systems: Out of a complete 14 system review, the patient complains of only the following symptoms, and all other reviewed systems are negative.:   No longer needed.   Social History   Socioeconomic History   Marital status: Widowed    Spouse name: Not on file   Number of children: 2   Years of education: PHD   Highest education level: Not on file  Occupational History   Occupation: Retired PhD Teacher English  Tobacco Use   Smoking status: Never   Smokeless tobacco: Never  Vaping Use   Vaping Use: Never used  Substance and Sexual Activity   Alcohol use: No   Drug use: No   Sexual activity: Not on file  Other Topics Concern   Not on file  Social History  Narrative   Lives alone, has brother close by   Patient is right-handed.   Caffeine use: 2-3 cups every day   Social Determinants of Health   Financial Resource Strain: Not on file  Food Insecurity: Not on file  Transportation Needs: Not on file  Physical Activity: Not on file  Stress: Not on file  Social Connections: Not on file    Family History  Problem Relation Age of Onset   Diabetes Mother    Heart disease Mother    Prostate cancer Father    Hypertension Brother    Prostate cancer Brother     Past Medical History:  Diagnosis Date  Arthritis    Cerebrovascular disease    CKD (chronic kidney disease)    Sees Dr Hollie Salk   Coronary artery disease    Depression    Diabetes ([place])    INSULIN DEPENDENT   Diabetic peripheral neuropathy ([facility])    Diabetic retinopathy ([place])    Diverticulitis    GERD (gastroesophageal reflux disease)    History of CVA (cerebrovascular accident)    x 2 no residulal   Hyperlipidemia    Hypertension    Obesity    Renal lesion    S/P TAVR (transcatheter aortic valve replacement) 11/13/2018   s/p TAVR with a 98mm Edwards S3U via the TF approach by Dr. [name] and Dr. Angelena Form   Severe aortic stenosis     Past Surgical History:  Procedure Laterality Date   ABDOMINAL HYSTERECTOMY     AV FISTULA PLACEMENT Left 12/18/2017   Procedure: ARTERIOVENOUS (AV) FISTULA CREATION ARM;  Surgeon: Marty Heck, MD;  Location: [facility] OR;  Service: Vascular;  Laterality: Left;   bilateral cataract surgery     CARDIAC CATHETERIZATION  01/07/2014   DR Einar Gip   COLON RESECTION  02/1999   COLONOSCOPY     COLOSTOMY  02/1999   COLOSTOMY CLOSURE  07/1999   EYE SURGERY Left 2019   FISTULA SUPERFICIALIZATION Left 06/29/2018   Procedure: FISTULA SUPERFICIALIZATION LEFT BRACHIOCEPHALIC;  Surgeon: Marty Heck, MD;  Location: [facility];  Service: Vascular;  Laterality: Left;   LEFT HEART CATHETERIZATION WITH CORONARY ANGIOGRAM N/A 01/07/2014   Procedure: LEFT  HEART CATHETERIZATION WITH CORONARY ANGIOGRAM;  Surgeon: Laverda Page, MD;  Location: [facility] CATH LAB;  Service: Cardiovascular;  Laterality: N/A;   LOOP RECORDER INSERTION N/A 02/17/2020   Procedure: LOOP RECORDER INSERTION;  Surgeon: Deboraha Sprang, MD;  Location: [facility] CV LAB;  Service: Cardiovascular;  Laterality: N/A;   middle cerebral artery stent placement Right    OTHER SURGICAL HISTORY     laser surgery   PTCA  01/07/2014   DES to RCA    DR Einar Gip   right knee surgery Right    for infection   RIGHT/LEFT HEART CATH AND CORONARY ANGIOGRAPHY N/A 10/23/2018   Procedure: RIGHT/LEFT HEART CATH AND CORONARY ANGIOGRAPHY;  Surgeon: Nigel Mormon, MD;  Location: [facility] CV LAB;  Service: Cardiovascular;  Laterality: N/A;   TEE WITHOUT CARDIOVERSION N/A 11/13/2018   Procedure: TRANSESOPHAGEAL ECHOCARDIOGRAM (TEE);  Surgeon: Burnell Blanks, MD;  Location: [facility];  Service: Open Heart Surgery;  Laterality: N/A;   TRANSCATHETER AORTIC VALVE REPLACEMENT, TRANSFEMORAL  11/13/2018   TRANSCATHETER AORTIC VALVE REPLACEMENT, TRANSFEMORAL N/A 11/13/2018   Procedure: TRANSCATHETER AORTIC VALVE REPLACEMENT, TRANSFEMORAL;  Surgeon: Burnell Blanks, MD;  Location: [facility];  Service: Open Heart Surgery;  Laterality: N/A;     Current Outpatient Medications on File Prior to Visit  Medication Sig Dispense Refill   acetaminophen (TYLENOL) 325 MG tablet Take 650 mg by mouth every 6 (six) hours as needed for moderate pain or headache.      allopurinol (ZYLOPRIM) 100 MG tablet Take 50 mg by mouth daily.     amLODipine (NORVASC) 10 MG tablet TAKE 1 TABLET BY MOUTH EVERY DAY 90 tablet 3   amoxicillin-clavulanate (AUGMENTIN) 875-125 MG tablet Take 1 tablet by mouth every 12 (twelve) hours. 14 tablet 0   brimonidine (ALPHAGAN) 0.2 % ophthalmic solution Place 1 drop into the left eye daily.     ferrous sulfate 325 (65 FE) MG tablet Take 325 mg by mouth  daily with breakfast.     HUMALOG  KWIKPEN 100 UNIT/ML KwikPen Inject 4-8 Units into the skin See admin instructions. Inject 4-8 units into the skin 3 times a day before meals, per sliding scale     hydrALAZINE (APRESOLINE) 50 MG tablet Take 1 tablet (50 mg total) by mouth every 8 (eight) hours. 90 tablet 0   insulin glargine (LANTUS) 100 UNIT/ML injection Inject 0.09 mLs (9 Units total) into the skin at bedtime.     isosorbide mononitrate (IMDUR) 120 MG 24 hr tablet Take 2 tablets (240 mg total) by mouth daily. 180 tablet 3   labetalol (NORMODYNE) 200 MG tablet Take 1 tablet (200 mg total) by mouth 2 (two) times daily. 180 tablet 3   REPATHA SURECLICK 466 MG/ML SOAJ INJECT 1 ML INTO THE SKIN EVERY 14 (FOURTEEN) DAYS. 6 mL 3   senna-docusate (SENOKOT-S) 8.6-50 MG tablet Take 2 tablets by mouth 2 (two) times daily. 120 tablet 0   torsemide (DEMADEX) 20 MG tablet Take 2 tablets (40 mg total) by mouth daily. 60 tablet 0   venlafaxine XR (EFFEXOR-XR) 150 MG 24 hr capsule Take 1 capsule (150 mg total) by mouth daily. 30 capsule 0   Current Facility-Administered Medications on File Prior to Visit  Medication Dose Route Frequency Provider Last Rate Last Admin   Evolocumab SOAJ 140 mg  140 mg Subcutaneous Once Cantwell, Celeste C, PA-C        No Known Allergies  Physical exam:  Today's Vitals   03/25/21 1311  BP: (!) 150/59  Pulse: (!) 51  Weight: 169 lb (76.7 kg)  Height: 5\' 3"  (1.6 m)   Body mass index is 29.94 kg/m.   Wt Readings from Last 3 Encounters:  03/25/21 169 lb (76.7 kg)  01/06/21 188 lb (85.3 kg)  11/05/20 180 lb (81.6 kg)     Ht Readings from Last 3 Encounters:  03/25/21 5\' 3"  (1.6 m)  01/06/21 5\' 3"  (1.6 m)  11/05/20 5\' 3"  (1.6 m)      General: The patient is awake, alert and appears not in acute distress. The patient is well groomed. Head: Normocephalic, atraumatic. Neck is supple. Mallampati 2,  neck circumference:15 inches . Nasal airflow is barely patent.  Crossbite to the right.Dental status:   Cardiovascular:  Regular rate and cardiac rhythm by pulse,  without distended neck veins. Respiratory: Lungs are clear to auscultation.  Skin:  Without evidence of ankle edema, or rash. Trunk: The patient's posture is erect.   Neurologic exam : The patient is awake and alert, oriented to place and time.   Memory subjective described as intact.  Attention span & concentration ability appears normal.  Speech is fluent,  without dysarthria, dysphonia or aphasia.  Mood and affect are appropriate.   Cranial nerves: no loss of smell or taste reported  Pupils are unequal, the left is disrounded and unable to constrict, the right is only 3 mm wide- and minimally reactive to light. Funduscopic exam deferred.   Extraocular movements in vertical and horizontal planes were intact and without nystagmus. No Diplopia. Has described a halo effect.  Visual fields by Whidbee perimetry are restricted. Left side vision loss - Hearing was intact to soft voice and Dannemiller rubbing.    Facial sensation intact to fine touch.  Facial motor strength is symmetric and tongue and uvula move midline.  The jaw moves to the right.  Neck ROM : rotation, tilt and flexion extension were normal for age and her leftshoulder shrug  was restricted. .    Motor exam:  Symmetric bulk, tone and ROM.   Normal tone without cog-wheeling, symmetric grip strength .   Sensory:  Fine touch and vibration were absent in toes and restricted in her ankles. Proprioception tested in the upper extremities was normal.   Coordination: Rapid alternating movements in the fingers/hands were of normal speed.  The Alatorre-to-nose maneuver was intact without evidence of ataxia, dysmetria or tremor.   Gait and station: Patient could rise unassisted from a seated position, walked without assistive device- she was asked by PT to use a cane.  Deep tendon reflexes: in the  upper and lower extremities are hyperreflexic- very , very brisk.  Babinski response  was deferred.       After spending a total time of  45  minutes face to face and additional time for physical and neurologic examination, review of laboratory studies,  personal review of imaging studies, reports and results of other testing and review of referral information / records as far as provided in visit, I have established the following assessments:  1) complex  sleep apnea- mild AHI of 17/h , She did well on ASV, but she also achieved a significant reduction on simple CPAP of 8 cm water pressure.   She was provided a FFM in lab, but she also tolerated this in lab- however , over 30 days since st up she cannot change masks any longer without costs to her.  Patient reports she is  unable to tolerate PAP/ ASV treatment, also on effort was made- the device shows no data.  She did not contact us or DME. She has not made herself available for any trouble shooting.      My Plan is to proceed with: No follow up in my clinic will be scheduled.      I do not plan to follow up   CC: I will share my notes with PCP  Electronically signed by: Larey Seat, MD 03/25/2021 1:33 PM  [facility] and [place] certified by The [employer] of Sleep Medicine and Diplomate of the [employer] of Sleep Medicine. Board certified In Neurology through the [place], Fellow of the [employer] of Neurology. Medical Director of [employer].

## 2021-03-27 ENCOUNTER — Other Ambulatory Visit: Payer: Self-pay | Admitting: Physical Medicine and Rehabilitation

## 2021-03-31 ENCOUNTER — Other Ambulatory Visit: Payer: Self-pay | Admitting: Student

## 2021-04-01 NOTE — Telephone Encounter (Signed)
Not a clinic patient.  Has a private PCP. ?

## 2021-04-09 ENCOUNTER — Encounter: Payer: Self-pay | Admitting: Podiatry

## 2021-04-09 ENCOUNTER — Other Ambulatory Visit: Payer: Self-pay

## 2021-04-09 ENCOUNTER — Ambulatory Visit (INDEPENDENT_AMBULATORY_CARE_PROVIDER_SITE_OTHER): Payer: Medicare PPO | Admitting: Podiatry

## 2021-04-09 DIAGNOSIS — M79674 Pain in right toe(s): Secondary | ICD-10-CM

## 2021-04-09 DIAGNOSIS — B351 Tinea unguium: Secondary | ICD-10-CM

## 2021-04-09 DIAGNOSIS — M79675 Pain in left toe(s): Secondary | ICD-10-CM

## 2021-04-09 NOTE — Progress Notes (Signed)
? ?  SUBJECTIVE ?Patient presents to office today as a new patient referral from her PCP for evaluation of her left great toenail lifting off of her toe.  She says it is not painful except for when she wears certain shoes.  She denies a history of injury.  She presents for further treatment and evaluation ? ?Past Medical History:  ?Diagnosis Date  ? Arthritis   ? Cerebrovascular disease   ? CKD (chronic kidney disease)   ? Sees Dr Hollie Salk  ? Coronary artery disease   ? Depression   ? Diabetes ([place])   ? INSULIN DEPENDENT  ? Diabetic peripheral neuropathy ([place])   ? Diabetic retinopathy ([place])   ? Diverticulitis   ? GERD (gastroesophageal reflux disease)   ? History of CVA (cerebrovascular accident)   ? x 2 no residulal  ? Hyperlipidemia   ? Hypertension   ? Obesity   ? Renal lesion   ? S/P TAVR (transcatheter aortic valve replacement) 11/13/2018  ? s/p TAVR with a 45mm Edwards S3U via the TF approach by Dr. [name] and Dr. Angelena Form  ? Severe aortic stenosis   ? ? ?OBJECTIVE ?General Patient is awake, alert, and oriented x 3 and in no acute distress. ?Derm Skin is dry and supple bilateral. Negative open lesions or macerations. Remaining integument unremarkable. Nails are tender, long, thickened and dystrophic with subungual debris, consistent with onychomycosis, great toes bilateral. No signs of infection noted. ?Vasc  DP and PT pedal pulses palpable bilaterally. Temperature gradient within normal limits.  ?Neuro Epicritic and protective threshold sensation grossly intact bilaterally.  ?Musculoskeletal Exam No symptomatic pedal deformities noted bilateral. Muscular strength within normal limits. ? ?ASSESSMENT ?1.  Pain due to onychomycosis of toenails both ? ?PLAN OF CARE ?1. Patient evaluated today.  ?2. Instructed to maintain good pedal hygiene and foot care.  ?3. Mechanical debridement of nails 1-5 bilaterally performed using a nail nipper.  ?4. Return to clinic in 3 mos.  ? ? ?Edrick Kins, DPM ?[place] ? ?Dr. Edrick Kins, DPM  ?  ?[address]                                     ?[place], [place] [address]                ?Office [phone]  ?Fax [phone] ? ? ? ? ?

## 2021-04-12 ENCOUNTER — Telehealth: Payer: Self-pay | Admitting: Podiatry

## 2021-04-12 NOTE — Telephone Encounter (Signed)
Patient called left message on after hours VM stating she saw Dr Amalia Hailey on Friday and she has some questions. Can she get in the swimming pool? She also wanted to know how to clean her feet. Pts number [phone]. ?

## 2021-04-12 NOTE — Telephone Encounter (Signed)
Yes she can get in the pool. I just trimmed her nails. Regular foot hygiene soap and water.  Recommend daily foot lotion.  Advised against going barefoot.  Please notify patient.  Thanks, Dr. Amalia Hailey

## 2021-04-12 NOTE — Telephone Encounter (Signed)
Called pt relayed message per Dr Amalia Hailey. Pt understood and will call with any other questions or concerns. ?

## 2021-05-04 ENCOUNTER — Encounter (INDEPENDENT_AMBULATORY_CARE_PROVIDER_SITE_OTHER): Payer: Self-pay | Admitting: Ophthalmology

## 2021-05-04 ENCOUNTER — Ambulatory Visit (INDEPENDENT_AMBULATORY_CARE_PROVIDER_SITE_OTHER): Payer: Medicare PPO | Admitting: Ophthalmology

## 2021-05-04 DIAGNOSIS — H26491 Other secondary cataract, right eye: Secondary | ICD-10-CM

## 2021-05-04 DIAGNOSIS — H348122 Central retinal vein occlusion, left eye, stable: Secondary | ICD-10-CM

## 2021-05-04 DIAGNOSIS — Z9889 Other specified postprocedural states: Secondary | ICD-10-CM | POA: Diagnosis not present

## 2021-05-04 DIAGNOSIS — E113512 Type 2 diabetes mellitus with proliferative diabetic retinopathy with macular edema, left eye: Secondary | ICD-10-CM | POA: Diagnosis not present

## 2021-05-04 DIAGNOSIS — E113551 Type 2 diabetes mellitus with stable proliferative diabetic retinopathy, right eye: Secondary | ICD-10-CM | POA: Diagnosis not present

## 2021-05-04 DIAGNOSIS — Z961 Presence of intraocular lens: Secondary | ICD-10-CM

## 2021-05-04 NOTE — Progress Notes (Signed)
? ? ?05/04/2021 ? ?  ? ?CHIEF COMPLAINT ?Patient presents for  ?Chief Complaint  ?Patient presents with  ? Diabetic Retinopathy without Macular Edema  ? ? ? ? ?HISTORY OF PRESENT ILLNESS: ?[name] is a 81 y.o. female who presents to the clinic today for:  ? ?HPI   ?6 mos fu OU oct fp. ?Pt states "no changes, my vision stays kind of blurry all the time."  ?Pt states she has Brimonidine BID OS, but reports she has not used them in about a month because they make her vision more blurred. ?LBS: 120 this morning, per patient. ?Last edited by [name] on 05/04/2021  9:10 AM.  ?  ? ? ?Referring physician: ?Audley Hose, MD ?[address] [name],  [name] [address] ? ?HISTORICAL INFORMATION:  ? ?Selected notes from the [place] ?  ? ?Lab Results  ?Component Value Date  ? HGBA1C 7.3 (H) 05/11/2020  ?  ? ?CURRENT MEDICATIONS: ?Current Outpatient Medications (Ophthalmic Drugs)  ?Medication Sig  ? brimonidine (ALPHAGAN) 0.2 % ophthalmic solution Place 1 drop into the left eye daily.  ? ?No current facility-administered medications for this visit. (Ophthalmic Drugs)  ? ?Current Outpatient Medications (Other)  ?Medication Sig  ? acetaminophen (TYLENOL) 325 MG tablet Take 650 mg by mouth every 6 (six) hours as needed for moderate pain or headache.   ? allopurinol (ZYLOPRIM) 100 MG tablet Take 50 mg by mouth daily.  ? amLODipine (NORVASC) 10 MG tablet TAKE 1 TABLET BY MOUTH EVERY DAY  ? amoxicillin-clavulanate (AUGMENTIN) 875-125 MG tablet Take 1 tablet by mouth every 12 (twelve) hours.  ? ferrous sulfate 325 (65 FE) MG tablet Take 325 mg by mouth daily with breakfast.  ? HUMALOG KWIKPEN 100 UNIT/ML KwikPen Inject 4-8 Units into the skin See admin instructions. Inject 4-8 units into the skin 3 times a day before meals, per sliding scale  ? hydrALAZINE (APRESOLINE) 50 MG tablet Take 1 tablet (50 mg total) by mouth every 8 (eight) hours.  ? insulin glargine (LANTUS) 100 UNIT/ML injection  Inject 0.09 mLs (9 Units total) into the skin at bedtime.  ? isosorbide mononitrate (IMDUR) 120 MG 24 hr tablet Take 2 tablets (240 mg total) by mouth daily.  ? labetalol (NORMODYNE) 200 MG tablet Take 1 tablet (200 mg total) by mouth 2 (two) times daily.  ? REPATHA SURECLICK 998 MG/ML SOAJ INJECT 1 ML INTO THE SKIN EVERY 14 (FOURTEEN) DAYS.  ? rosuvastatin (CRESTOR) 40 MG tablet Take by mouth.  ? senna-docusate (SENOKOT-S) 8.6-50 MG tablet Take 2 tablets by mouth 2 (two) times daily.  ? torsemide (DEMADEX) 20 MG tablet Take 2 tablets (40 mg total) by mouth daily.  ? venlafaxine XR (EFFEXOR-XR) 150 MG 24 hr capsule Take 1 capsule (150 mg total) by mouth daily.  ? ?Current Facility-Administered Medications (Other)  ?Medication Route  ? Evolocumab SOAJ 140 mg Subcutaneous  ? ? ? ? ?REVIEW OF SYSTEMS: ? ? ? ?ALLERGIES ?No Known Allergies ? ?PAST MEDICAL HISTORY ?Past Medical History:  ?Diagnosis Date  ? Arthritis   ? Cerebrovascular disease   ? CKD (chronic kidney disease)   ? Sees Dr Hollie Salk  ? Coronary artery disease   ? Depression   ? Diabetes ([place])   ? INSULIN DEPENDENT  ? Diabetic peripheral neuropathy ([place])   ? Diabetic retinopathy ([place])   ? Diverticulitis   ? GERD (gastroesophageal reflux disease)   ? History of CVA (cerebrovascular accident)   ?  x 2 no residulal  ? Hyperlipidemia   ? Hypertension   ? Obesity   ? Renal lesion   ? S/P TAVR (transcatheter aortic valve replacement) 11/13/2018  ? s/p TAVR with a 65mm Edwards S3U via the TF approach by Dr. [name] and Dr. Angelena Form  ? Severe aortic stenosis   ? ?Past Surgical History:  ?Procedure Laterality Date  ? ABDOMINAL HYSTERECTOMY    ? AV FISTULA PLACEMENT Left 12/18/2017  ? Procedure: ARTERIOVENOUS (AV) FISTULA CREATION ARM;  Surgeon: Marty Heck, MD;  Location: [facility] OR;  Service: Vascular;  Laterality: Left;  ? bilateral cataract surgery    ? CARDIAC CATHETERIZATION  01/07/2014  ? DR [name]  ? COLON RESECTION  02/1999  ? COLONOSCOPY    ? COLOSTOMY  02/1999  ?  COLOSTOMY CLOSURE  07/1999  ? EYE SURGERY Left 2019  ? FISTULA SUPERFICIALIZATION Left 06/29/2018  ? Procedure: FISTULA SUPERFICIALIZATION LEFT BRACHIOCEPHALIC;  Surgeon: Marty Heck, MD;  Location: [place];  Service: Vascular;  Laterality: Left;  ? LEFT HEART CATHETERIZATION WITH CORONARY ANGIOGRAM N/A 01/07/2014  ? Procedure: LEFT HEART CATHETERIZATION WITH CORONARY ANGIOGRAM;  Surgeon: Susan Page, MD;  Location: [facility] CATH LAB;  Service: Cardiovascular;  Laterality: N/A;  ? LOOP RECORDER INSERTION N/A 02/17/2020  ? Procedure: LOOP RECORDER INSERTION;  Surgeon: Susan Sprang, MD;  Location: [place] CV LAB;  Service: Cardiovascular;  Laterality: N/A;  ? middle cerebral artery stent placement Right   ? OTHER SURGICAL HISTORY    ? laser surgery  ? PTCA  01/07/2014  ? DES to RCA    DR [name]  ? right knee surgery Right   ? for infection  ? RIGHT/LEFT HEART CATH AND CORONARY ANGIOGRAPHY N/A 10/23/2018  ? Procedure: RIGHT/LEFT HEART CATH AND CORONARY ANGIOGRAPHY;  Surgeon: Susan Mormon, MD;  Location: [place] CV LAB;  Service: Cardiovascular;  Laterality: N/A;  ? TEE WITHOUT CARDIOVERSION N/A 11/13/2018  ? Procedure: TRANSESOPHAGEAL ECHOCARDIOGRAM (TEE);  Surgeon: Susan Blanks, MD;  Location: [facility] [place];  Service: Open Heart Surgery;  Laterality: N/A;  ? TRANSCATHETER AORTIC VALVE REPLACEMENT, TRANSFEMORAL  11/13/2018  ? TRANSCATHETER AORTIC VALVE REPLACEMENT, TRANSFEMORAL N/A 11/13/2018  ? Procedure: TRANSCATHETER AORTIC VALVE REPLACEMENT, TRANSFEMORAL;  Surgeon: Susan Blanks, MD;  Location: [place];  Service: Open Heart Surgery;  Laterality: N/A;  ? ? ?FAMILY HISTORY ?Family History  ?Problem Relation Age of Onset  ? Diabetes Mother   ? Heart disease Mother   ? Prostate cancer Father   ? Hypertension Brother   ? Prostate cancer Brother   ? ? ?SOCIAL HISTORY ?Social History  ? ?Tobacco Use  ? Smoking status: Never  ? Smokeless tobacco: Never  ?Vaping Use  ? Vaping Use: Never used   ?Substance Use Topics  ? Alcohol use: No  ? Drug use: No  ? ?  ? ?  ? ?OPHTHALMIC EXAM: ? ?Base Eye Exam   ? ? Visual Acuity (ETDRS)   ? ?   Right Left  ? Dist [place] 20/20 -1 CF at 3'  ? Dist ph [place]  20/400  ? ?  ?  ? ? Tonometry (Tonopen, 9:12 AM)   ? ?   Right Left  ? Pressure 9 11  ? ?  ?  ? ? Pupils   ? ?   Dark Light Shape React APD  ? Right 2.5 2 Round Minimal None  ? Left   Irregular  None  ? ?  ?  ? ?  Visual Fields (Counting fingers)   ? ?   Left Right  ?   Full  ? Restrictions Total superior nasal, inferior nasal deficiencies; Partial outer superior temporal deficiency   ? ?  ?  ? ? Extraocular Movement   ? ?   Right Left  ?  Full Full  ? ?  ?  ? ? Neuro/Psych   ? ? Oriented x3: Yes  ? Mood/Affect: Normal  ? ?  ?  ? ? Dilation   ? ? Both eyes: 1.0% Mydriacyl, 2.5% Phenylephrine @ 9:12 AM  ? ?  ?  ? ?  ? ?Slit Lamp and Fundus Exam   ? ? External Exam   ? ?   Right Left  ? External Normal Normal  ? ?  ?  ? ? Slit Lamp Exam   ? ?   Right Left  ? Lids/Lashes Normal Normal  ? Conjunctiva/Sclera White and quiet White and quiet  ? Cornea Band keratopathy at limbus not in vis axis Clear  ? Anterior Chamber Deep and quiet Deep and quiet  ? Iris Round and reactive Round and reactive  ? Lens Centered posterior chamber intraocular lens, clear posterior capsule yet some cloudiness and fluid collection sequestered in the posterior portion of the capsule Centered anterior chamber intraocular lens  ? Anterior Vitreous Normal Normal  ? ?  ?  ? ? Fundus Exam   ? ?   Right Left  ? Posterior Vitreous Posterior vitreous detachment vitrectomized  ? Disc Normal Normal, pink  ? C/D Ratio 0.6 0.6  ? Macula Normal no exudates, Macular thickening, Microaneurysms, no cystoid macular edema  ? Vessels NPDR-Severe, stable, no N/V old CRVO  ? Periphery good peripheral prp, 270 degrees including nasal good prp 360  ? ?  ?  ? ?  ? ? ?IMAGING AND PROCEDURES  ?Imaging and Procedures for 05/04/21 ? ?OCT, Retina - OU - Both Eyes   ? ?   ?Right  Eye ?Quality was good. Scan locations included subfoveal. Central Foveal Thickness: 268. Progression has been stable. Findings include normal foveal contour.  ? ?Left Eye ?Quality was good. Scan locations i

## 2021-05-04 NOTE — Assessment & Plan Note (Signed)
Monitor intracapsular fluid sequestrum posterior to IOL for cloudiness as time unfolds ?

## 2021-05-04 NOTE — Assessment & Plan Note (Signed)
Stable OD over time, ?

## 2021-05-04 NOTE — Assessment & Plan Note (Signed)
OS looks great ?

## 2021-05-04 NOTE — Assessment & Plan Note (Signed)

## 2021-05-04 NOTE — Assessment & Plan Note (Signed)
No active disease, peripheral retinal nonperfusion treated in the past with moderate to complete PRP in 3 quadrants in order to prevent progression of this monocular patient  to  PDR ?

## 2021-05-04 NOTE — Assessment & Plan Note (Signed)
Stable, collateralized optic nerve, no active disease ?

## 2021-05-16 ENCOUNTER — Emergency Department (HOSPITAL_BASED_OUTPATIENT_CLINIC_OR_DEPARTMENT_OTHER): Payer: Medicare PPO

## 2021-05-16 ENCOUNTER — Emergency Department (HOSPITAL_BASED_OUTPATIENT_CLINIC_OR_DEPARTMENT_OTHER)
Admission: EM | Admit: 2021-05-16 | Discharge: 2021-05-16 | Disposition: A | Discharge status: Home / Self Care | Payer: Medicare PPO | Attending: Emergency Medicine | Admitting: Emergency Medicine

## 2021-05-16 ENCOUNTER — Other Ambulatory Visit: Payer: Self-pay

## 2021-05-16 ENCOUNTER — Encounter (HOSPITAL_BASED_OUTPATIENT_CLINIC_OR_DEPARTMENT_OTHER): Payer: Self-pay | Admitting: Emergency Medicine

## 2021-05-16 DIAGNOSIS — Z794 Long term (current) use of insulin: Secondary | ICD-10-CM | POA: Diagnosis not present

## 2021-05-16 DIAGNOSIS — N189 Chronic kidney disease, unspecified: Secondary | ICD-10-CM | POA: Diagnosis not present

## 2021-05-16 DIAGNOSIS — W19XXXA Unspecified fall, initial encounter: Secondary | ICD-10-CM

## 2021-05-16 DIAGNOSIS — I509 Heart failure, unspecified: Secondary | ICD-10-CM | POA: Insufficient documentation

## 2021-05-16 DIAGNOSIS — W182XXA Fall in (into) shower or empty bathtub, initial encounter: Secondary | ICD-10-CM | POA: Insufficient documentation

## 2021-05-16 DIAGNOSIS — E1122 Type 2 diabetes mellitus with diabetic chronic kidney disease: Secondary | ICD-10-CM | POA: Insufficient documentation

## 2021-05-16 DIAGNOSIS — S060X0A Concussion without loss of consciousness, initial encounter: Secondary | ICD-10-CM | POA: Diagnosis not present

## 2021-05-16 DIAGNOSIS — D631 Anemia in chronic kidney disease: Secondary | ICD-10-CM | POA: Insufficient documentation

## 2021-05-16 DIAGNOSIS — I130 Hypertensive heart and chronic kidney disease with heart failure and stage 1 through stage 4 chronic kidney disease, or unspecified chronic kidney disease: Secondary | ICD-10-CM | POA: Insufficient documentation

## 2021-05-16 DIAGNOSIS — I2510 Atherosclerotic heart disease of native coronary artery without angina pectoris: Secondary | ICD-10-CM | POA: Insufficient documentation

## 2021-05-16 DIAGNOSIS — Z79899 Other long term (current) drug therapy: Secondary | ICD-10-CM | POA: Diagnosis not present

## 2021-05-16 DIAGNOSIS — Z7982 Long term (current) use of aspirin: Secondary | ICD-10-CM | POA: Diagnosis not present

## 2021-05-16 DIAGNOSIS — S0990XA Unspecified injury of head, initial encounter: Secondary | ICD-10-CM | POA: Diagnosis present

## 2021-05-16 LAB — CBC
HCT: 33.2 % — ABNORMAL LOW (ref 36.0–46.0)
Hemoglobin: 10.7 g/dL — ABNORMAL LOW (ref 12.0–15.0)
MCH: 28.2 pg (ref 26.0–34.0)
MCHC: 32.2 g/dL (ref 30.0–36.0)
MCV: 87.6 fL (ref 80.0–100.0)
Platelets: 263 10*3/uL (ref 150–400)
RBC: 3.79 MIL/uL — ABNORMAL LOW (ref 3.87–5.11)
RDW: 15.7 % — ABNORMAL HIGH (ref 11.5–15.5)
WBC: 8.3 10*3/uL (ref 4.0–10.5)
nRBC: 0 % (ref 0.0–0.2)

## 2021-05-16 LAB — COMPREHENSIVE METABOLIC PANEL
ALT: 7 U/L (ref 0–44)
AST: 13 U/L — ABNORMAL LOW (ref 15–41)
Albumin: 3.7 g/dL (ref 3.5–5.0)
Alkaline Phosphatase: 74 U/L (ref 38–126)
Anion gap: 11 (ref 5–15)
BUN: 46 mg/dL — ABNORMAL HIGH (ref 8–23)
CO2: 23 mmol/L (ref 22–32)
Calcium: 9.3 mg/dL (ref 8.9–10.3)
Chloride: 104 mmol/L (ref 98–111)
Creatinine, Ser: 3.28 mg/dL — ABNORMAL HIGH (ref 0.44–1.00)
GFR, Estimated: 14 mL/min — ABNORMAL LOW (ref >60)
Glucose, Bld: 149 mg/dL — ABNORMAL HIGH (ref 70–99)
Potassium: 3.9 mmol/L (ref 3.5–5.1)
Sodium: 138 mmol/L (ref 135–145)
Total Bilirubin: 0.4 mg/dL (ref 0.3–1.2)
Total Protein: 6.7 g/dL (ref 6.5–8.1)

## 2021-05-16 LAB — PROTIME-INR
INR: 1 (ref 0.8–1.2)
Prothrombin Time: 13.4 seconds (ref 11.4–15.2)

## 2021-05-16 MED ORDER — HYDRALAZINE HCL 25 MG PO TABS
50.0000 mg | ORAL_TABLET | Freq: Once | ORAL | Status: AC
  Administered 2021-05-16: 50 mg via ORAL
  Filled 2021-05-16: qty 2

## 2021-05-16 NOTE — Discharge Instructions (Addendum)
Keep your appointment with your PCP on Friday.  I did send an email to inform him of your ED visit today.  Take ibuprofen and Tylenol at home as needed for pain.  Continue to take your prescribed daily medications.  Avoid activities that worsen headaches.  These can often include things like bright lights, physical activity, concentration.  Importantly, try to to avoid any future head injuries over the next several weeks. ?

## 2021-05-16 NOTE — ED Provider Notes (Signed)
?[facility] EMERGENCY DEPT ?Provider Note ? ? ?CSN: [id number] ?Arrival date & time: 05/16/21  1656 ? ?  ? ?History ? ?Chief Complaint  ?Patient presents with  ? Fall  ? ? ?[name] is a 81 y.o. female. ? ? ?Fall ?Associated symptoms include headaches. Patient presents after a fall.  Fall occurred this morning while in the shower.  She describes it as mechanical in nature.  She has an unsteady left hip at baseline.  This has caused her to require a walker in the past.  When she was in the shower, she reached up to adjust the showerhead.  This caused her left hip to give out and she fell backwards, striking the back of her head.  She denies LOC.  She was able to carry on throughout the day like she normally would.  She did go to church.  She did experience a subsequent headache and blurry vision.  At baseline, patient has a very limited vision out of her left eye and is able to see lights and movement only.  She typically has 20/20 vision without corrective lenses from her right eye.  Today, she noticed blurriness from her right eye.  She did take her antihypertensive medications this morning.  She is due for her afternoon dose of hydralazine.  Currently, her headache has resolved and she denies any areas of discomfort.  She has not felt any focal areas of numbness or weakness throughout the day but has felt a generalized weakness.  She is on aspirin but no other anticoagulation or antiplatelet medications ? ?  ? ?Home Medications ?Prior to Admission medications   ?Medication Sig Start Date End Date Taking? Authorizing Provider  ?acetaminophen (TYLENOL) 325 MG tablet Take 650 mg by mouth every 6 (six) hours as needed for moderate pain or headache.     [provider]  ?allopurinol (ZYLOPRIM) 100 MG tablet Take 50 mg by mouth daily.    [provider]  ?amLODipine (NORVASC) 10 MG tablet TAKE 1 TABLET BY MOUTH EVERY DAY 10/30/20   Patwardhan, Manish J, MD  ?amoxicillin-clavulanate  (AUGMENTIN) 875-125 MG tablet Take 1 tablet by mouth every 12 (twelve) hours. 10/04/20   Kommor, [place], MD  ?brimonidine (ALPHAGAN) 0.2 % ophthalmic solution Place 1 drop into the left eye daily. 04/19/19   [provider]  ?ferrous sulfate 325 (65 FE) MG tablet Take 325 mg by mouth daily with breakfast.    [provider]  ?HUMALOG KWIKPEN 100 UNIT/ML KwikPen Inject 4-8 Units into the skin See admin instructions. Inject 4-8 units into the skin 3 times a day before meals, per sliding scale 05/28/18   [provider]  ?hydrALAZINE (APRESOLINE) 50 MG tablet Take 1 tablet (50 mg total) by mouth every 8 (eight) hours. 02/26/20   Love, Ivan Anchors, PA-C  ?insulin glargine (LANTUS) 100 UNIT/ML injection Inject 0.09 mLs (9 Units total) into the skin at bedtime. 06/27/20   Madalyn Rob, MD  ?isosorbide mononitrate (IMDUR) 120 MG 24 hr tablet Take 2 tablets (240 mg total) by mouth daily. 11/05/20   Patwardhan, Reynold Bowen, MD  ?labetalol (NORMODYNE) 200 MG tablet Take 1 tablet (200 mg total) by mouth 2 (two) times daily. 11/05/20   Patwardhan, Reynold Bowen, MD  ?REPATHA SURECLICK 643 MG/ML SOAJ INJECT 1 ML INTO THE SKIN EVERY 14 (FOURTEEN) DAYS. 02/05/21   Patwardhan, Reynold Bowen, MD  ?rosuvastatin (CRESTOR) 40 MG tablet Take by mouth. 03/26/21   [provider]  ?senna-docusate (SENOKOT-S) 8.6-50  MG tablet Take 2 tablets by mouth 2 (two) times daily. 02/26/20   Love, Ivan Anchors, PA-C  ?torsemide (DEMADEX) 20 MG tablet Take 2 tablets (40 mg total) by mouth daily. 02/26/20   Love, Ivan Anchors, PA-C  ?venlafaxine XR (EFFEXOR-XR) 150 MG 24 hr capsule Take 1 capsule (150 mg total) by mouth daily. 02/27/20   Bary Leriche, PA-C  ?   ? ?Allergies    ?Patient has no known allergies.   ? ?Review of Systems   ?Review of Systems  ?Eyes:  Positive for visual disturbance.  ?Neurological:  Positive for weakness (Generalized) and headaches.  ?All other systems reviewed and are negative. ? ?Physical Exam ?Updated Vital Signs ?BP (!)  163/47   Pulse (!) 57   Temp 98 ?F (36.7 ?C) (Oral)   Resp (!) 23   Ht 5' 2.5" (1.588 m)   Wt 73.9 kg   SpO2 94%   BMI 29.34 kg/m?  ?Physical Exam ?Vitals and nursing note reviewed.  ?Constitutional:   ?   General: She is not in acute distress. ?   Appearance: Normal appearance. She is well-developed and normal weight. She is not toxic-appearing or diaphoretic.  ?HENT:  ?   Head: Normocephalic and atraumatic.  ?   Right Ear: External ear normal.  ?   Left Ear: External ear normal.  ?   Nose: Nose normal.  ?   Mouth/Throat:  ?   Mouth: Mucous membranes are moist.  ?   Pharynx: Oropharynx is clear.  ?Eyes:  ?   Conjunctiva/sclera: Conjunctivae normal.  ?   Comments: Mild esotropia which is baseline for her.  Left eye visual acuity is minimal and able to see movements, which is baseline.  Current right eye visual acuity is 20/40, decreased from baseline.  ?Cardiovascular:  ?   Rate and Rhythm: Normal rate and regular rhythm.  ?   Heart sounds: No murmur heard. ?Pulmonary:  ?   Effort: Pulmonary effort is normal. No respiratory distress.  ?   Breath sounds: Normal breath sounds.  ?Chest:  ?   Chest wall: No tenderness.  ?Abdominal:  ?   General: There is no distension.  ?   Palpations: Abdomen is soft.  ?   Tenderness: There is no abdominal tenderness.  ?Musculoskeletal:     ?   General: No swelling, tenderness or deformity. Normal range of motion.  ?   Cervical back: Normal range of motion and neck supple. No rigidity or tenderness.  ?   Right lower leg: No edema.  ?   Left lower leg: No edema.  ?Skin: ?   General: Skin is warm and dry.  ?   Capillary Refill: Capillary refill takes less than 2 seconds.  ?   Coloration: Skin is not jaundiced or pale.  ?Neurological:  ?   Mental Status: She is alert and oriented to person, place, and time.  ?   Cranial Nerves: No cranial nerve deficit.  ?   Sensory: No sensory deficit.  ?   Motor: No weakness.  ?   Coordination: Coordination normal.  ?Psychiatric:     ?   Mood and  Affect: Mood normal.     ?   Behavior: Behavior normal.     ?   Thought Content: Thought content normal.     ?   Judgment: Judgment normal.  ? ? ?ED Results / Procedures / Treatments   ?Labs ?(all labs ordered are listed, but only abnormal results are displayed) ?  Labs Reviewed  ?COMPREHENSIVE METABOLIC PANEL - Abnormal; Notable for the following components:  ?    Result Value  ? Glucose, Bld 149 (*)   ? BUN 46 (*)   ? Creatinine, Ser 3.28 (*)   ? AST 13 (*)   ? GFR, Estimated 14 (*)   ? All other components within normal limits  ?CBC - Abnormal; Notable for the following components:  ? RBC 3.79 (*)   ? Hemoglobin 10.7 (*)   ? HCT 33.2 (*)   ? RDW 15.7 (*)   ? All other components within normal limits  ?PROTIME-INR  ? ? ?EKG ?EKG Interpretation ? ?Date/Time:  Sunday May 16 2021 18:15:38 EDT ?Ventricular Rate:  58 ?PR Interval:  217 ?QRS Duration: 98 ?QT Interval:  411 ?QTC Calculation: 404 ?R Axis:   30 ?Text Interpretation: Sinus rhythm Borderline prolonged PR interval Probable anterior infarct, age indeterminate Lateral leads are also involved Confirmed by Godfrey Pick [phone]) on 05/16/2021 8:06:01 PM ? ?Radiology ?CT HEAD WO CONTRAST ? ?Result Date: 05/16/2021 ?CLINICAL DATA:  [place] this morning in the shower, impact to occipital area, head/neck trauma, headache, blurred vision, history hypertension, diabetes mellitus EXAM: CT HEAD WITHOUT CONTRAST CT CERVICAL SPINE WITHOUT CONTRAST TECHNIQUE: Multidetector CT imaging of the head and cervical spine was performed following the standard protocol without intravenous contrast. Multiplanar CT image reconstructions of the cervical spine were also generated. RADIATION DOSE REDUCTION: This exam was performed according to the departmental dose-optimization program which includes automated exposure control, adjustment of the mA and/or kV according to patient size and/or use of iterative reconstruction technique. COMPARISON:  CT head 10/04/2020 FINDINGS: CT HEAD FINDINGS Brain:  Generalized atrophy. Normal ventricular morphology. No midline shift or mass effect. Old infarcts RIGHT frontal and RIGHT occipital. Small vessel chronic ischemic changes of deep cerebral white matter. No in

## 2021-05-16 NOTE — ED Notes (Signed)
RT note: Pt. placed in RM. 7 via W/C, V/S updated. ?

## 2021-05-16 NOTE — ED Triage Notes (Signed)
Fell in shower this am , occipital headache . On aspirin . Blurry vision .   ?

## 2021-05-16 NOTE — ED Notes (Signed)
EDP at bedside  

## 2021-06-02 ENCOUNTER — Other Ambulatory Visit: Payer: Self-pay | Admitting: Student

## 2021-06-03 NOTE — Telephone Encounter (Signed)
Not a Clinic patient.  Has a Private PCP.

## 2021-06-29 ENCOUNTER — Other Ambulatory Visit: Payer: Self-pay | Admitting: Student

## 2021-06-29 DIAGNOSIS — I25118 Atherosclerotic heart disease of native coronary artery with other forms of angina pectoris: Secondary | ICD-10-CM

## 2021-07-07 ENCOUNTER — Ambulatory Visit: Payer: Medicare PPO | Admitting: Cardiology

## 2021-07-13 ENCOUNTER — Ambulatory Visit: Payer: Medicare PPO | Admitting: Cardiology

## 2021-07-14 ENCOUNTER — Encounter: Payer: Self-pay | Admitting: Cardiology

## 2021-07-14 ENCOUNTER — Ambulatory Visit: Payer: Medicare PPO | Admitting: Cardiology

## 2021-07-14 VITALS — BP 133/58 | HR 57 | Temp 97.8°F | Resp 16 | Ht 62.0 in | Wt 167.8 lb

## 2021-07-14 DIAGNOSIS — I25118 Atherosclerotic heart disease of native coronary artery with other forms of angina pectoris: Secondary | ICD-10-CM

## 2021-07-14 DIAGNOSIS — I10 Essential (primary) hypertension: Secondary | ICD-10-CM

## 2021-07-14 NOTE — Progress Notes (Signed)
Subjective:   [name], female    DOB: 23-May-1940, 81 y.o.   MRN: [id number]  HPI   Chief Complaint  Patient presents with   Coronary Artery Disease   Hypertension   Follow-up    6 moths    81 y.o. African American female with hypertension, hyperlipidemia, diabetes with CKD stage V, CAD, h/o strokes (2015, 2020), s/p TAVR for severe AS (11/2018)  Patient was hospitalized earlier this month with shortness of breath and cough,. She reportedly had temp 102 F. BNP was elevated. She was treated for both pneumonia and congestive heart failure. She has had significant improvement in her symptoms since then. Her Cr was significantly elevated at 3.52, which is higher than before. Trop HS was mildly elevated at 46, BNP at 1792. She denies any chest pain. Leg edema persists.    Current Outpatient Medications:    acetaminophen (TYLENOL) 325 MG tablet, Take 650 mg by mouth every 6 (six) hours as needed for moderate pain or headache. , Disp: , Rfl:    amLODipine (NORVASC) 10 MG tablet, TAKE 1 TABLET BY MOUTH EVERY DAY, Disp: 90 tablet, Rfl: 3   brimonidine (ALPHAGAN) 0.2 % ophthalmic solution, Place 1 drop into the left eye daily., Disp: , Rfl:    ferrous sulfate 325 (65 FE) MG tablet, Take 325 mg by mouth daily with breakfast., Disp: , Rfl:    HUMALOG KWIKPEN 100 UNIT/ML KwikPen, Inject 4-8 Units into the skin See admin instructions. Inject 4-8 units into the skin 3 times a day before meals, per sliding scale, Disp: , Rfl:    hydrALAZINE (APRESOLINE) 50 MG tablet, Take 1 tablet (50 mg total) by mouth every 8 (eight) hours., Disp: 90 tablet, Rfl: 0   insulin glargine (LANTUS) 100 UNIT/ML injection, Inject 0.09 mLs (9 Units total) into the skin at bedtime. (Patient taking differently: Inject 6 Units into the skin at bedtime.), Disp: , Rfl:    isosorbide mononitrate (IMDUR) 120 MG 24 hr tablet, TAKE 1 TABLET BY MOUTH EVERY DAY, Disp: 90 tablet, Rfl: 3   labetalol (NORMODYNE) 200 MG tablet, Take  1 tablet (200 mg total) by mouth 2 (two) times daily., Disp: 180 tablet, Rfl: 3   REPATHA SURECLICK 563 MG/ML SOAJ, INJECT 1 ML INTO THE SKIN EVERY 14 (FOURTEEN) DAYS., Disp: 6 mL, Rfl: 3   torsemide (DEMADEX) 20 MG tablet, Take 2 tablets (40 mg total) by mouth daily., Disp: 60 tablet, Rfl: 0   venlafaxine XR (EFFEXOR-XR) 150 MG 24 hr capsule, Take 1 capsule (150 mg total) by mouth daily., Disp: 30 capsule, Rfl: 0   senna-docusate (SENOKOT-S) 8.6-50 MG tablet, Take 2 tablets by mouth 2 (two) times daily. (Patient not taking: Reported on 07/14/2021), Disp: 120 tablet, Rfl: 0  Cardiovascular & other pertient studies:  EKG 07/14/2021: Sinus rhythm 55 bpm First degree A-V block  Poor R-wave progression Nonspecific T-abnormality  Personally reviewed and independently interpreted. Echocardiogram 07/05/2021:  1. Left ventricle: The left ventricular cavity size is normal. Wall     thickness is normal. Systolic function is at the lower limits of normal     by visual assessment. The ejection fraction is 50% (+/-5%).  2. Right ventricle: The right ventricular cavity size is normal. Systolic     function is normal as estimated by visual and quantitative measures.  3. Left atrium: The left atrium is mildly dilated as determined by left     atrial volume index.  4. Mitral valve: There is trivial  mitral stenosis. There is mild to moderate     mitral regurgitation directed eccentrically and toward the free wall. The     eccentric nature of the jet may underestimate the regurgitation.  5. Aortic valve: There is a SAPIEN, 23 mm transcatheter valve ([facility]     11/13/2018) that is well seated in the aortic valve position. The mean     systolic gradient is 53.6IW Hg. The peak systolic gradient is 80.3OZ Hg.     Dimensionless Index: 0.41.  6. Tricuspid valve: There is mild-moderate tricuspid regurgitation.  7. Pulmonary arteries: Pulmonary artery estimated peak systolic pressure:     22QM Hg. This is consistent  with moderate pulmonary hypertension.  Prior study date: 01/16/2021. Compared to the previous echo report from  [facility] in [place], there is an increase in RV pressures  with nor moderate pulmonary hypertension. RVSP of 58 mmHg (previously noted  as 37 mmHg).   CT chest 07/05/2021: 1. Ill-defined masslike opacity with air bronchograms within the right upper lobe favoring an inflammatory/infectious process. However, follow-up is recommended after appropriate antibiotic therapy.  2. Cardiomegaly post TAVR with mild pulmonary vascular congestion.  3. A few scattered tiny pulmonary nodules measuring up to 4 mm likely reflecting a nonspecific inflammatory/infectious process. Attention on follow-up is recommended.  4. Small bilateral pleural effusions.    ---------------------------------------------------------------  RHC/LHC 10/2018: RA: 4 mmHg RV: 54/9 mmHg PA: 54/17 mmHg, mean PAP 34 mmhg. PCWP: 25 mmHg with tall V wave   CO: 5.2 L/min CI: 3 L/min/m2   LM: Normal LAD: Normal LCx:: Normal RCA: Ostial calcific at least 60% stenosis. Ostial RPDA 20% stenosis.    Right coronary artery was difficult to engage. I had to use multiple catheters, and was finally able to engage with [facility] catheter. there was dampnening of the pressure with catheter engagement. Ostium is at least 60% stenosed with moderate calcification. There was no signifciant improvement with IC NTG. I did not perform FFR in order to limit contrast use in this patient with Cr of 3.0, not on dialysis.    Recommendation: Will discuss with structural valve clinic. If functional evaluation of RCA is felt necesary prior to TAVR/SAVR, would consider non-invasive stress test.   Personally reviewed and independently interpreted. Recent labs: 07/07/2021: Glucose 220, BUN/Cr 52/3.52. EGFR 12. Na/K 135/3.5.  H/H 11/34. MCV 86. Platelets 220 HbA1C 7.1% Trop HS 46 BNP 1792  12/26/2020: Glucose 132, BUN/Cr 51/3.28.  EGFR 14. Na/K 139/4.5. Rest of the CMP normal H/H 11/35. MCV 89. Platelets 258 Chol 171, TG 113, HDL 60, LDL 91 BNP 632 Trop HS 21, 25  08/13/2020: Glucose 135, BUN 45, creatinine 2.49, GFR 19, sodium 140, potassium 4.9   Review of Systems  Constitutional: Negative for malaise/fatigue and weight gain.  Cardiovascular:  Positive for chest pain. Negative for claudication, near-syncope, orthopnea and paroxysmal nocturnal dyspnea.  Respiratory:  Negative for shortness of breath.   Hematologic/Lymphatic: Does not bruise/bleed easily.  Gastrointestinal:  Negative for melena.  Neurological:  Negative for dizziness and weakness.        Vitals:   07/14/21 1328  BP: (!) 133/58  Pulse: (!) 57  Resp: 16  Temp: 97.8 F (36.6 C)  SpO2: 100%    Body mass index is 30.69 kg/m. Filed Weights   07/14/21 1328  Weight: 167 lb 12.8 oz (76.1 kg)     Objective:   Physical Exam Vitals and nursing note reviewed.  Constitutional:      General: She is not  in acute distress.    Appearance: She is well-developed.  Cardiovascular:     Rate and Rhythm: Normal rate and regular rhythm.     Pulses: Intact distal pulses.     Heart sounds: S1 normal and S2 normal. Murmur heard.     Harsh midsystolic murmur is present with a grade of 1/6 at the upper right sternal border radiating to the neck.     No gallop.  Pulmonary:     Effort: Pulmonary effort is normal.     Breath sounds: Normal breath sounds.  Musculoskeletal:     Right lower leg: Edema (1+) present.     Left lower leg: Edema (1+) present.  Neurological:     Mental Status: She is alert.       Assessment & Recommendations:    81 y.o. African American female with hypertension, hyperlipidemia, diabetes with CKD stage V, CAD, h/o strokes (2015, 2020), s/p TAVR for severe AS (11/2018)  HFpEF: Possible acute decompensation in the setting of hospitalization with pneumonia. Worsening CKD also likely contributing.  I would hesitate from  further increasing her diuretic regimen given worsening CKD.  Increase in PASP likely WHO Grp II.  CAD: Known CAD with moderate ostial RCA disease on pre-TAVR cath in 2020.   Angina symptoms are fairly well controlled on current anti anginal therapy.  Continue Aspirin 81 mg daily. No changes made today to her antihypertensive and antianginal therapy.    S/P TAVR (transcatheter aortic valve replacement): Normal functioning bioprosthetic aortic valve (Echocardiogram 12/2019). Continue Aspirin 81 mg daily. Not functioning well on echocardiogram 12/2020. Diastolic dysfunction due to uncontrolled hypertension, advanced CKD.  Chronic kidney disease (CKD), stage V: F/u w/Dr/. Hollie Salk  F/u in 3 months with repeat echocardiogram    Nigel Mormon, PA-C 07/14/2021, 1:36 PM Office: [phone]

## 2021-07-16 NOTE — Progress Notes (Signed)
Has she missed any doses in the last few weeks?

## 2021-07-16 NOTE — Progress Notes (Signed)
07/13/2021: Glucose 103, BUN/Cr 55/3.9, eGFR 11. Na/K 140/4.4. Albumin 3.3 low. Rest of the CMP normal H/H 9/28. MCV 87. Platelets 352 Chol 236, TG 141, HDL 42, LDL 166  Reviewed labs as above. Please Cr has further increased. Highly recommend f/u w/nephrology. Also, cholesterol has significantly increased. Please ask if the patient is taking Repatha.  Thanks MJP

## 2021-07-16 NOTE — Progress Notes (Signed)
This past Tuesday

## 2021-07-16 NOTE — Progress Notes (Signed)
Patient stated she is taking repatha

## 2021-07-16 NOTE — Progress Notes (Signed)
When was the last dose?

## 2021-07-19 NOTE — Progress Notes (Signed)
Tried calling patient --no answer

## 2021-07-28 NOTE — Progress Notes (Signed)
Tried calling patient no answer left a vm

## 2021-08-17 ENCOUNTER — Other Ambulatory Visit: Payer: Self-pay | Admitting: Student

## 2021-08-17 IMAGING — MR MRI HEAD WITHOUT CONTRAST
9 of 11 series · 33 of 48 positions shown · non-contrast
Comparison: CT head 09/09/2018.  MRI 07/17/2010

CLINICAL DATA: Focal neuro deficit greater than 6 hours. Suspect
stroke

EXAM:
MRI HEAD WITHOUT CONTRAST
MRA HEAD WITHOUT CONTRAST
TECHNIQUE: Multiplanar, multiecho pulse sequences of the brain and surrounding
structures were obtained without intravenous contrast. Angiographic
images of the head were obtained using MRA technique without
contrast.

[Series 4: DWI · axial · 3.0mm · 1.09mm/px · z∈[-48,+111]mm · 8 of 108 slices shown (1 of 4)]
[im 1/108]
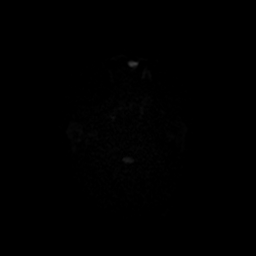
[im 16/108]
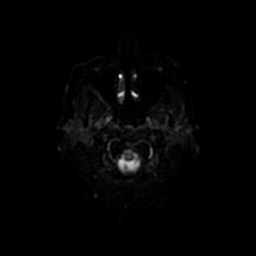
[im 31/108]
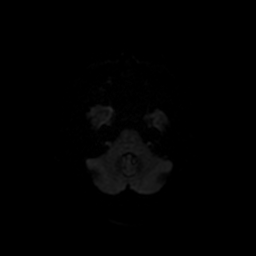
[im 46/108]
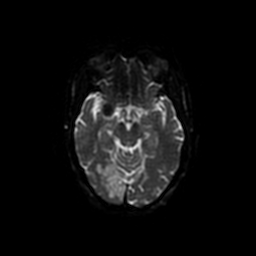
[im 62/108]
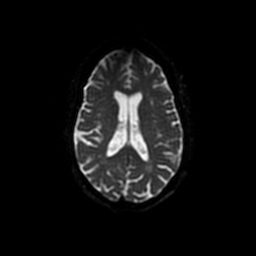
[im 77/108]
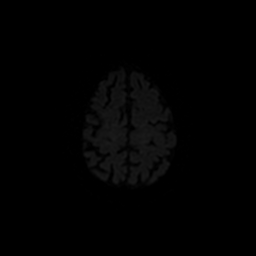
[im 92/108]
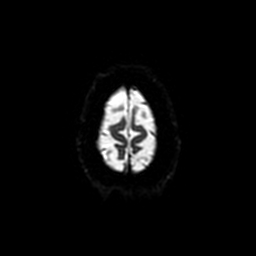
[im 108/108]
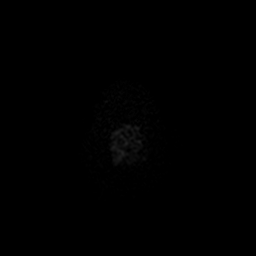

[Series 5: DWI · coronal · 5.0mm · 1.09mm/px · 6 of 80 slices shown (2 of 4)]
[im 1/80]
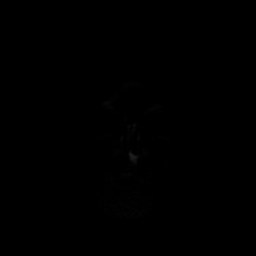
[im 16/80]
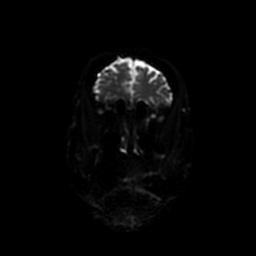
[im 32/80]
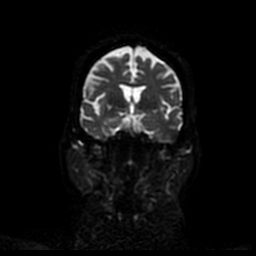
[im 48/80]
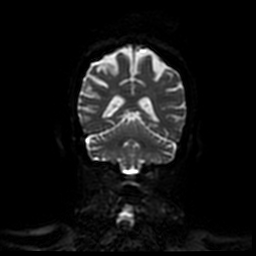
[im 64/80]
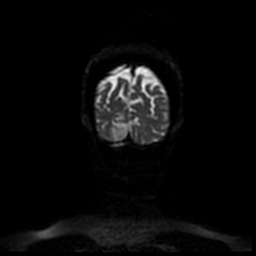
[im 80/80]
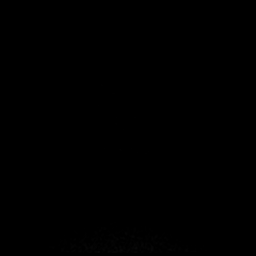

[Series 7: T1 · sagittal · 5.0mm · 0.47mm/px · 2 of 20 slices shown (1 of 2)]
[im 1/20]
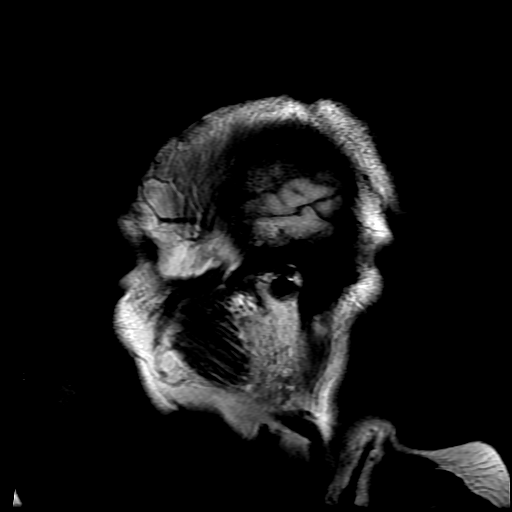
[im 20/20]
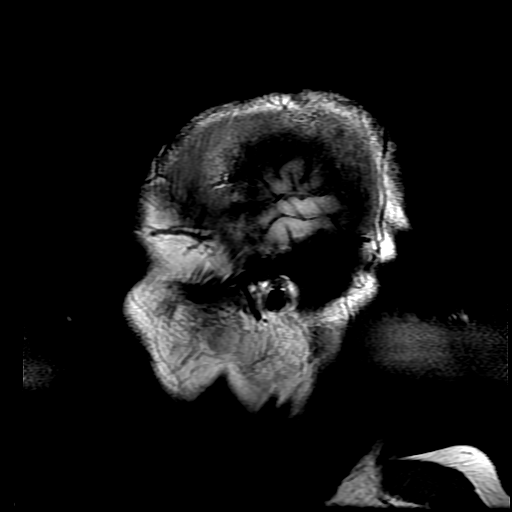

[Series 8: FLAIR · axial · 3.0mm · 0.43mm/px · z∈[-51,+87]mm · 2 of 24 slices shown]
[im 1/24]
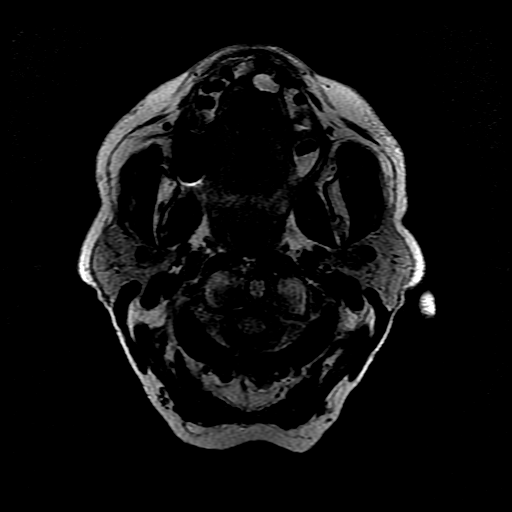
[im 24/24]
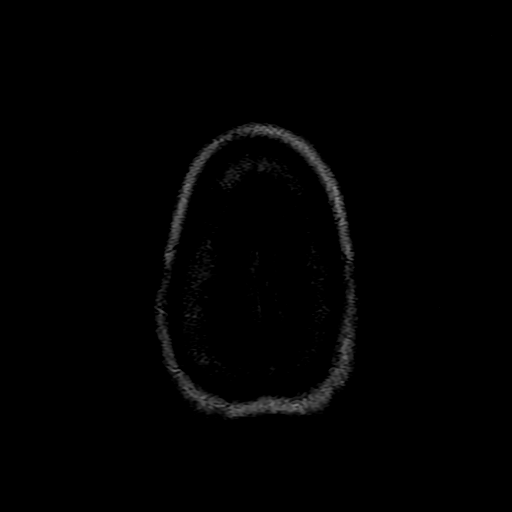

[Series 9: T2 · axial · 5.0mm · 0.43mm/px · z∈[-51,+87]mm · 2 of 24 slices shown (1 of 2)]
[im 1/24]
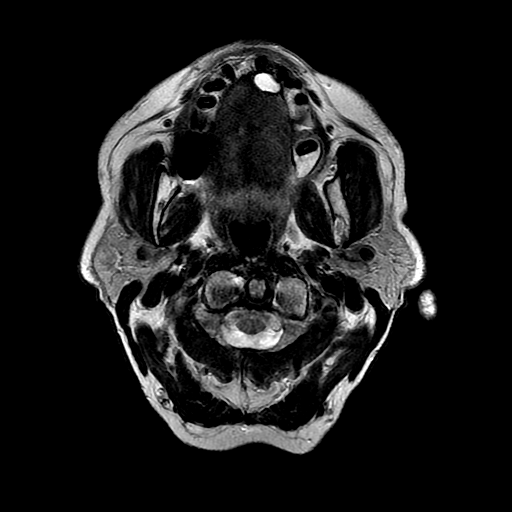
[im 24/24]
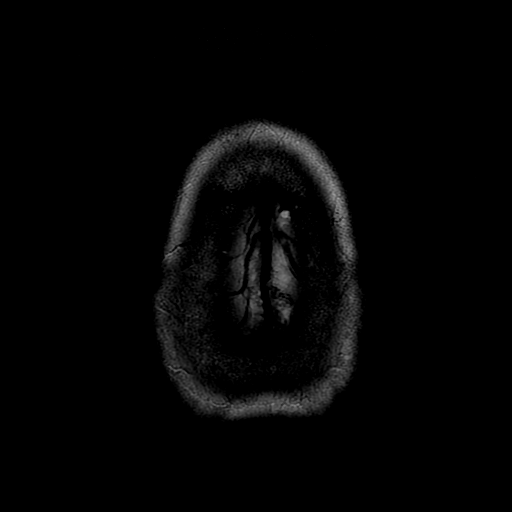

[Series 10: T1 · axial · 3.0mm · 0.43mm/px · z∈[-62,+4]mm · 4 of 104 slices shown (2 of 2)]
[im 1/104]
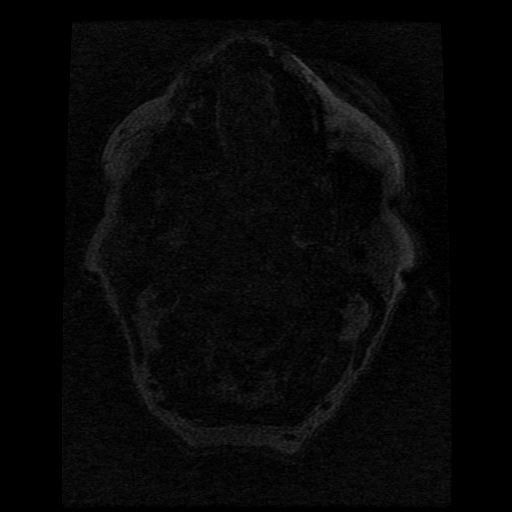
[im 15/104]
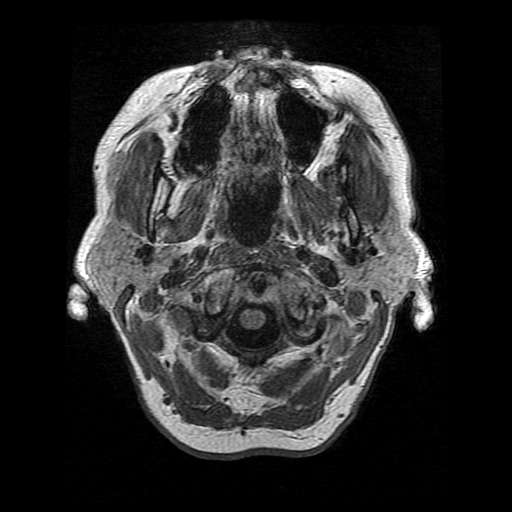
[im 30/104]
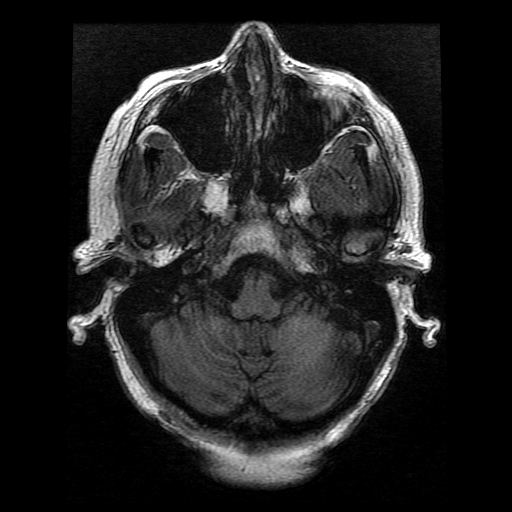
[im 45/104]
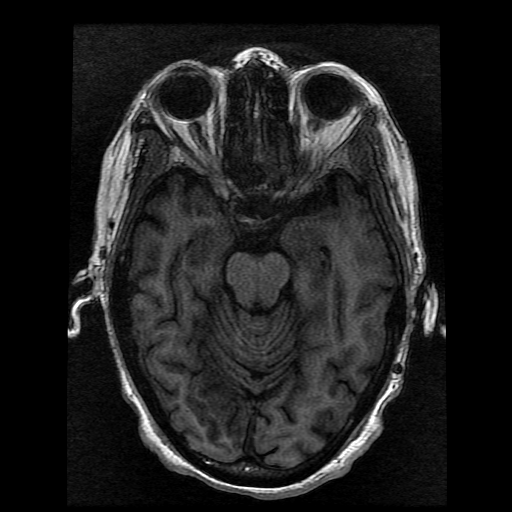

[Series 11: T2 · coronal · 5.0mm · 0.39mm/px · 2 of 24 slices shown (2 of 2)]
[im 1/24]
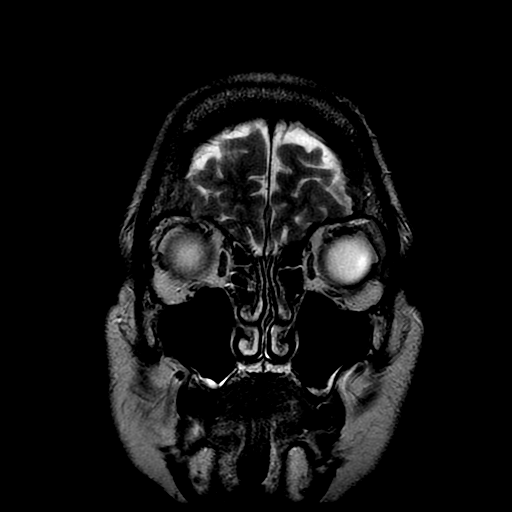
[im 24/24]
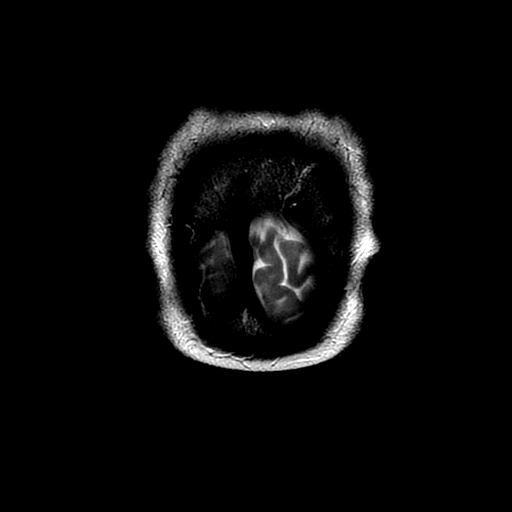

[Series 400: DWI · axial · 3.0mm · 1.09mm/px · z∈[-48,+111]mm · 4 of 54 slices shown (3 of 4)]
[im 1/54]
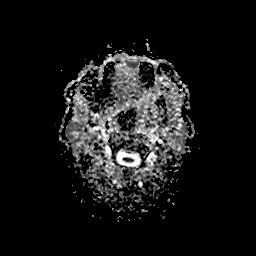
[im 18/54]
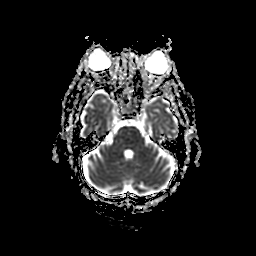
[im 36/54]
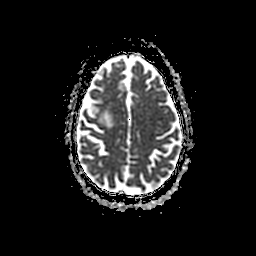
[im 54/54]
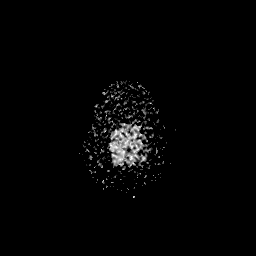

[Series 500: DWI · coronal · 5.0mm · 1.09mm/px · 3 of 40 slices shown (4 of 4)]
[im 1/40]
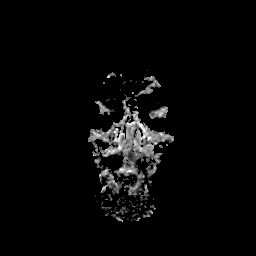
[im 20/40]
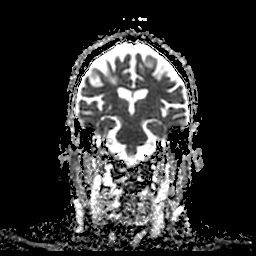
[im 40/40]
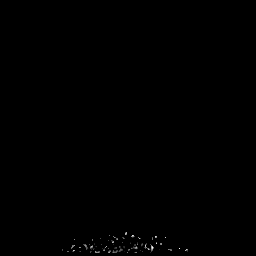

[33 of 48 positions shown; findings below may reference images not displayed]

FINDINGS: MRI HEAD FINDINGS

Brain: Acute infarct right occipital lobe in the inferior portion.
No other acute infarct identified.

Mild atrophy. Negative for hydrocephalus. Chronic infarct in the
right posterior frontal lobe. Chronic microhemorrhage in the right
parietal white matter.

Vascular: Stent in the right M1 segment causing artifact. Otherwise
normal arterial flow voids at the skull base.

Skull and upper cervical spine: Negative

Sinuses/Orbits: Mild mucosal edema paranasal sinuses. Bilateral
cataract surgery.

Other: None

MRA HEAD FINDINGS

Both vertebral arteries patent to the basilar. Basilar widely
patent. PICA not visualized. Prominent right AICA patent.

Fetal origin left posterior cerebral artery. Branch occlusion of
left P3 branch.

Mild stenosis right P2 segment. Right P3 segments patent without
significant stenosis.

Internal carotid artery patent bilaterally. Anterior cerebral
arteries widely patent bilaterally. Mild stenosis left M1 segment.
Left MCA branches widely patent.

Loss of flow related signal right middle cerebral artery throughout
its course due to artifact from stent. Distal flow evaluation not
adequately assessed by this study.
IMPRESSION: 1. Acute infarct right occipital lobe. No associated hemorrhage in
the infarct. Chronic microhemorrhage right parietal white matter
appears separate.
2. Right M1 stent obscures evaluation of the right middle cerebral
artery on MRA
3. Right posterior cerebral artery patent.
4. Occluded distal P3 branch on the left.

## 2021-08-17 NOTE — Telephone Encounter (Signed)
Not a Clinic patient. Does not have upcoming appointments.  Has a Pvt PCP.

## 2021-08-18 ENCOUNTER — Telehealth: Payer: Self-pay | Admitting: Cardiology

## 2021-08-18 NOTE — Telephone Encounter (Signed)
Patient says she just spoke with [facility] intern Lavella Lemons about her blood pressure. Inquired if it was an MA she spoke with, she insists it's an intern here named Tanya. She said her blood pressure was reading ok initially, but she just received news that her friend passed away and now her blood pressure has changed. Please call her back.

## 2021-08-19 ENCOUNTER — Other Ambulatory Visit: Payer: Self-pay

## 2021-08-19 DIAGNOSIS — I25118 Atherosclerotic heart disease of native coronary artery with other forms of angina pectoris: Secondary | ICD-10-CM

## 2021-08-23 ENCOUNTER — Telehealth: Payer: Self-pay

## 2021-08-23 DIAGNOSIS — R0789 Other chest pain: Secondary | ICD-10-CM

## 2021-08-23 DIAGNOSIS — I25118 Atherosclerotic heart disease of native coronary artery with other forms of angina pectoris: Secondary | ICD-10-CM

## 2021-08-23 MED ORDER — ISOSORBIDE MONONITRATE ER 120 MG PO TB24: 240.0000 mg | ORAL_TABLET | Freq: Every day | ORAL | 5 refills | Status: DC

## 2021-08-23 NOTE — Telephone Encounter (Signed)
Patient called to have me confirm dosing of Imdur with the pharmacy because she was having issues getting it filled. Confirmed with Dr. Virgina Jock intended  dose is 240mg  daily.

## 2021-08-31 NOTE — Telephone Encounter (Signed)
Patient has an appointment to see her PCP and will discuss with them her BP concerns.

## 2021-09-02 ENCOUNTER — Other Ambulatory Visit (HOSPITAL_COMMUNITY): Payer: Self-pay | Admitting: Family Medicine

## 2021-09-02 DIAGNOSIS — M7989 Other specified soft tissue disorders: Secondary | ICD-10-CM

## 2021-09-03 ENCOUNTER — Ambulatory Visit (HOSPITAL_COMMUNITY)
Admission: RE | Admit: 2021-09-03 | Discharge: 2021-09-03 | Disposition: A | Discharge status: Home / Self Care | Payer: Medicare PPO | Source: Ambulatory Visit | Attending: Cardiovascular Disease | Admitting: Cardiovascular Disease

## 2021-09-03 DIAGNOSIS — M7989 Other specified soft tissue disorders: Secondary | ICD-10-CM | POA: Diagnosis not present

## 2021-09-06 ENCOUNTER — Other Ambulatory Visit: Payer: Self-pay | Admitting: Internal Medicine

## 2021-09-06 ENCOUNTER — Ambulatory Visit
Admission: RE | Admit: 2021-09-06 | Discharge: 2021-09-06 | Disposition: A | Discharge status: Home / Self Care | Payer: Medicare PPO | Source: Ambulatory Visit | Attending: Internal Medicine | Admitting: Internal Medicine

## 2021-09-06 DIAGNOSIS — I509 Heart failure, unspecified: Secondary | ICD-10-CM

## 2021-09-06 DIAGNOSIS — R918 Other nonspecific abnormal finding of lung field: Secondary | ICD-10-CM

## 2021-09-07 ENCOUNTER — Other Ambulatory Visit: Payer: Self-pay

## 2021-09-07 ENCOUNTER — Emergency Department (HOSPITAL_COMMUNITY): Payer: Medicare PPO

## 2021-09-07 ENCOUNTER — Encounter (HOSPITAL_COMMUNITY): Payer: Self-pay

## 2021-09-07 ENCOUNTER — Inpatient Hospital Stay (HOSPITAL_COMMUNITY): Admit: 2021-09-07 | Payer: Medicare PPO

## 2021-09-07 ENCOUNTER — Encounter (HOSPITAL_COMMUNITY): Payer: Medicare PPO

## 2021-09-07 ENCOUNTER — Inpatient Hospital Stay (HOSPITAL_COMMUNITY)
Admission: EM | Admit: 2021-09-07 | Discharge: 2021-09-14 | DRG: 291 | Disposition: A | Discharge status: Home Health | Payer: Medicare PPO | Attending: Internal Medicine | Admitting: Internal Medicine

## 2021-09-07 DIAGNOSIS — K219 Gastro-esophageal reflux disease without esophagitis: Secondary | ICD-10-CM | POA: Diagnosis present

## 2021-09-07 DIAGNOSIS — I272 Pulmonary hypertension, unspecified: Secondary | ICD-10-CM | POA: Diagnosis present

## 2021-09-07 DIAGNOSIS — E785 Hyperlipidemia, unspecified: Secondary | ICD-10-CM | POA: Diagnosis present

## 2021-09-07 DIAGNOSIS — J9601 Acute respiratory failure with hypoxia: Secondary | ICD-10-CM | POA: Diagnosis present

## 2021-09-07 DIAGNOSIS — E1142 Type 2 diabetes mellitus with diabetic polyneuropathy: Secondary | ICD-10-CM | POA: Diagnosis present

## 2021-09-07 DIAGNOSIS — Z833 Family history of diabetes mellitus: Secondary | ICD-10-CM | POA: Diagnosis not present

## 2021-09-07 DIAGNOSIS — Z952 Presence of prosthetic heart valve: Secondary | ICD-10-CM | POA: Diagnosis not present

## 2021-09-07 DIAGNOSIS — Z794 Long term (current) use of insulin: Secondary | ICD-10-CM | POA: Diagnosis not present

## 2021-09-07 DIAGNOSIS — Z8673 Personal history of transient ischemic attack (TIA), and cerebral infarction without residual deficits: Secondary | ICD-10-CM | POA: Diagnosis not present

## 2021-09-07 DIAGNOSIS — N179 Acute kidney failure, unspecified: Secondary | ICD-10-CM | POA: Diagnosis present

## 2021-09-07 DIAGNOSIS — I350 Nonrheumatic aortic (valve) stenosis: Secondary | ICD-10-CM

## 2021-09-07 DIAGNOSIS — I248 Other forms of acute ischemic heart disease: Secondary | ICD-10-CM | POA: Diagnosis present

## 2021-09-07 DIAGNOSIS — N189 Chronic kidney disease, unspecified: Secondary | ICD-10-CM | POA: Diagnosis not present

## 2021-09-07 DIAGNOSIS — E11319 Type 2 diabetes mellitus with unspecified diabetic retinopathy without macular edema: Secondary | ICD-10-CM | POA: Diagnosis present

## 2021-09-07 DIAGNOSIS — Z8249 Family history of ischemic heart disease and other diseases of the circulatory system: Secondary | ICD-10-CM

## 2021-09-07 DIAGNOSIS — Z8042 Family history of malignant neoplasm of prostate: Secondary | ICD-10-CM | POA: Diagnosis not present

## 2021-09-07 DIAGNOSIS — E1122 Type 2 diabetes mellitus with diabetic chronic kidney disease: Secondary | ICD-10-CM | POA: Diagnosis present

## 2021-09-07 DIAGNOSIS — I25118 Atherosclerotic heart disease of native coronary artery with other forms of angina pectoris: Secondary | ICD-10-CM | POA: Diagnosis present

## 2021-09-07 DIAGNOSIS — U071 COVID-19: Secondary | ICD-10-CM | POA: Diagnosis present

## 2021-09-07 DIAGNOSIS — I5043 Acute on chronic combined systolic (congestive) and diastolic (congestive) heart failure: Secondary | ICD-10-CM | POA: Diagnosis present

## 2021-09-07 DIAGNOSIS — I130 Hypertensive heart and chronic kidney disease with heart failure and stage 1 through stage 4 chronic kidney disease, or unspecified chronic kidney disease: Secondary | ICD-10-CM | POA: Diagnosis present

## 2021-09-07 DIAGNOSIS — E876 Hypokalemia: Secondary | ICD-10-CM | POA: Diagnosis present

## 2021-09-07 DIAGNOSIS — D631 Anemia in chronic kidney disease: Secondary | ICD-10-CM | POA: Diagnosis present

## 2021-09-07 DIAGNOSIS — J810 Acute pulmonary edema: Secondary | ICD-10-CM

## 2021-09-07 DIAGNOSIS — N184 Chronic kidney disease, stage 4 (severe): Secondary | ICD-10-CM | POA: Diagnosis present

## 2021-09-07 DIAGNOSIS — E1151 Type 2 diabetes mellitus with diabetic peripheral angiopathy without gangrene: Secondary | ICD-10-CM | POA: Diagnosis present

## 2021-09-07 DIAGNOSIS — N185 Chronic kidney disease, stage 5: Secondary | ICD-10-CM | POA: Diagnosis not present

## 2021-09-07 DIAGNOSIS — R778 Other specified abnormalities of plasma proteins: Secondary | ICD-10-CM | POA: Diagnosis not present

## 2021-09-07 DIAGNOSIS — Z90710 Acquired absence of both cervix and uterus: Secondary | ICD-10-CM | POA: Diagnosis not present

## 2021-09-07 DIAGNOSIS — I10 Essential (primary) hypertension: Secondary | ICD-10-CM | POA: Diagnosis present

## 2021-09-07 DIAGNOSIS — E1129 Type 2 diabetes mellitus with other diabetic kidney complication: Secondary | ICD-10-CM | POA: Diagnosis present

## 2021-09-07 DIAGNOSIS — I2510 Atherosclerotic heart disease of native coronary artery without angina pectoris: Secondary | ICD-10-CM | POA: Diagnosis present

## 2021-09-07 LAB — BASIC METABOLIC PANEL
Anion gap: 11 (ref 5–15)
BUN: 66 mg/dL — ABNORMAL HIGH (ref 8–23)
CO2: 22 mmol/L (ref 22–32)
Calcium: 8.8 mg/dL — ABNORMAL LOW (ref 8.9–10.3)
Chloride: 107 mmol/L (ref 98–111)
Creatinine, Ser: 3.45 mg/dL — ABNORMAL HIGH (ref 0.44–1.00)
GFR, Estimated: 13 mL/min — ABNORMAL LOW (ref >60)
Glucose, Bld: 249 mg/dL — ABNORMAL HIGH (ref 70–99)
Potassium: 4.1 mmol/L (ref 3.5–5.1)
Sodium: 140 mmol/L (ref 135–145)

## 2021-09-07 LAB — CBC WITH DIFFERENTIAL/PLATELET
Abs Immature Granulocytes: 0.05 10*3/uL (ref 0.00–0.07)
Basophils Absolute: 0 10*3/uL (ref 0.0–0.1)
Basophils Relative: 0 %
Eosinophils Absolute: 0.1 10*3/uL (ref 0.0–0.5)
Eosinophils Relative: 2 %
HCT: 30.5 % — ABNORMAL LOW (ref 36.0–46.0)
Hemoglobin: 10.1 g/dL — ABNORMAL LOW (ref 12.0–15.0)
Immature Granulocytes: 1 %
Lymphocytes Relative: 17 %
Lymphs Abs: 1.3 10*3/uL (ref 0.7–4.0)
MCH: 28.8 pg (ref 26.0–34.0)
MCHC: 33.1 g/dL (ref 30.0–36.0)
MCV: 86.9 fL (ref 80.0–100.0)
Monocytes Absolute: 0.6 10*3/uL (ref 0.1–1.0)
Monocytes Relative: 8 %
Neutro Abs: 5.6 10*3/uL (ref 1.7–7.7)
Neutrophils Relative %: 72 %
Platelets: 295 10*3/uL (ref 150–400)
RBC: 3.51 MIL/uL — ABNORMAL LOW (ref 3.87–5.11)
RDW: 15.7 % — ABNORMAL HIGH (ref 11.5–15.5)
WBC: 7.7 10*3/uL (ref 4.0–10.5)
nRBC: 0 % (ref 0.0–0.2)

## 2021-09-07 LAB — CBG MONITORING, ED: Glucose-Capillary: 202 mg/dL — ABNORMAL HIGH (ref 70–99)

## 2021-09-07 LAB — SARS CORONAVIRUS 2 BY RT PCR: SARS Coronavirus 2 by RT PCR: POSITIVE — AB

## 2021-09-07 LAB — BRAIN NATRIURETIC PEPTIDE: B Natriuretic Peptide: 974.2 pg/mL — ABNORMAL HIGH (ref 0.0–100.0)

## 2021-09-07 LAB — TROPONIN I (HIGH SENSITIVITY)
Troponin I (High Sensitivity): 30 ng/L — ABNORMAL HIGH (ref <18)
Troponin I (High Sensitivity): 35 ng/L — ABNORMAL HIGH (ref <18)

## 2021-09-07 LAB — D-DIMER, QUANTITATIVE: D-Dimer, Quant: 2.61 ug/mL-FEU — ABNORMAL HIGH (ref 0.00–0.50)

## 2021-09-07 LAB — MAGNESIUM: Magnesium: 2 mg/dL (ref 1.7–2.4)

## 2021-09-07 MED ORDER — NITROGLYCERIN IN D5W 200-5 MCG/ML-% IV SOLN
0.0000 ug/min | INTRAVENOUS | Status: DC
  Administered 2021-09-07: 50 ug/min via INTRAVENOUS
  Filled 2021-09-07: qty 250

## 2021-09-07 MED ORDER — HYDROCOD POLI-CHLORPHE POLI ER 10-8 MG/5ML PO SUER: 5.0000 mL | Freq: Two times a day (BID) | ORAL | Status: DC | PRN

## 2021-09-07 MED ORDER — NITROGLYCERIN 2 % TD OINT
1.0000 [in_us] | TOPICAL_OINTMENT | Freq: Once | TRANSDERMAL | Status: AC
  Administered 2021-09-07: 1 [in_us] via TOPICAL
  Filled 2021-09-07: qty 1

## 2021-09-07 MED ORDER — VENLAFAXINE HCL ER 150 MG PO CP24
150.0000 mg | ORAL_CAPSULE | Freq: Once | ORAL | Status: AC
  Administered 2021-09-07: 150 mg via ORAL
  Filled 2021-09-07 (×2): qty 1

## 2021-09-07 MED ORDER — INSULIN ASPART 100 UNIT/ML IJ SOLN
0.0000 [IU] | INTRAMUSCULAR | Status: DC
  Administered 2021-09-08: 1 [IU] via SUBCUTANEOUS
  Administered 2021-09-08: 2 [IU] via SUBCUTANEOUS

## 2021-09-07 MED ORDER — GUAIFENESIN-DM 100-10 MG/5ML PO SYRP
10.0000 mL | ORAL_SOLUTION | ORAL | Status: DC | PRN
  Administered 2021-09-09: 10 mL via ORAL
  Filled 2021-09-07: qty 10

## 2021-09-07 MED ORDER — FUROSEMIDE 10 MG/ML IJ SOLN
80.0000 mg | Freq: Once | INTRAMUSCULAR | Status: AC
  Administered 2021-09-07: 80 mg via INTRAVENOUS
  Filled 2021-09-07: qty 8

## 2021-09-07 NOTE — Assessment & Plan Note (Signed)
-  chronic avoid nephrotoxic medications such as NSAIDs, Vanco Zosyn combo,  avoid hypotension, continue to follow renal function  

## 2021-09-07 NOTE — Assessment & Plan Note (Signed)
-   Order Sensitive SSI   - continue home insulin regimen  Lantus 6 units,  -  check TSH and HgA1C  - Hold by mouth medications

## 2021-09-07 NOTE — H&P (Signed)
[name] VCB:[id number] DOB: Feb 22, 1940 DOA: 09/07/2021     PCP: Audley Hose, MD   Outpatient Specialists:   CARDS: Nigel Mormon, MD NEphrology:Dr/. Hollie Salk      Patient arrived to ER on 09/07/21 at 1113 Referred by Attending Toy Baker, MD   Patient coming from:    home Lives alone,        Chief Complaint:   Chief Complaint  Patient presents with   Shortness of Breath    HPI: [name] is a 81 y.o. female with medical history significant of CAD, HTN, D 2, CKD stage V, CVA 2015 2020, severe AS sp TAVR    Presented with  SOB Presents with SOB from PCP office, yesterday CXR was reportedly not showing edem so PCP was worrited about PE Pt has been having leg edema Dry cough  No unilateral edema no hx of blood clots Denies any fever [name] to rule out DVT ADDENDUM, patient had [name] a few days ago which was negative for DVT, has known hx of diastolic CHF Pt hypoxic on arrival to id 80's started on [place] continued to have increased work of breathing and started on BIpap Does not smoke or drink reports increase in fluid retention recently. She is recovering from URI for past 1 week.  No recent fevers or chills denies any chest pain  Initial COVID TEST    POSITIVE,     Lab Results  Component Value Date   SARSCOV2NAA POSITIVE (A) 09/07/2021   [place] NEGATIVE 12/26/2020   [place] NEGATIVE 10/04/2020   [place] NEGATIVE 06/24/2020     Regarding pertinent Chronic problems:     Hyperlipidemia -  on REPATHA Lipid Panel     Component Value Date/Time   CHOL 171 11/09/2020 0852   TRIG 113 11/09/2020 0852   HDL 60 11/09/2020 0852   CHOLHDL 2.9 11/09/2020 0852   CHOLHDL 4.4 02/11/2020 1625   VLDL 30 02/11/2020 1625   LDLCALC 91 11/09/2020 0852   LABVLDL 20 11/09/2020 0852     HTN on NOrvasc, hydralazine imdur, labetalol   chronic CHF diastolic/systolic - last [id number]/[id number]/9357: The ejection fraction is 50% (+/-5%).   Pulmonary artery  estimated peak systolic pressure:     01XB Hg. This is consistent with moderate pulmonary hypertension.  Torsemide    CAD  - On Aspirin, statin, betablocker,                  -  followed by cardiology                - last cardiac cath RHC/LHC 10/2018:    DM 2 -  Lab Results  Component Value Date   HGBA1C 7.3 (H) 05/11/2020   on insulin,          Hx of CVA -  with/out residual deficits on Aspirin 81 mg,       CKD stage V baseline Cr 34 Estimated Creatinine Clearance: 12 mL/min (A) (by C-G formula based on SCr of 3.45 mg/dL (H)).  Lab Results  Component Value Date   CREATININE 3.45 (H) 09/07/2021   CREATININE 3.28 (H) 05/16/2021   CREATININE 3.28 (H) 12/26/2020      Chronic anemia - baseline hg Hemoglobin & Hematocrit  Recent Labs    12/26/20 1340 05/16/21 1801 09/07/21 1225  HGB 11.1* 10.7* 10.1*     While in ER:    CXR - Stable right-sided pleural effusion with associated right basilar atelectasis or infiltrate.  CTA chest - 1. Small loculated right pleural effusion, increased in size when compared with prior CT exam. New trace left pleural effusion. 2. Mild pulmonary edema and bibasilar atelectasis. 3.  Aortic Atherosclerosis   Following Medications were ordered in ER: Medications  nitroGLYCERIN 50 mg in dextrose 5 % 250 mL (0.2 mg/mL) infusion (75 mcg/min Intravenous Rate/Dose Change 09/07/21 1915)  furosemide (LASIX) injection 80 mg (80 mg Intravenous Given 09/07/21 1841)  venlafaxine XR (EFFEXOR-XR) 24 hr capsule 150 mg (150 mg Oral Given 09/07/21 1839)  nitroGLYCERIN (NITROGLYN) 2 % ointment 1 inch (1 inch Topical Given 09/07/21 1842)         ED Triage Vitals  Enc Vitals Group     BP 09/07/21 1206 (!) 145/65     Pulse Rate 09/07/21 1206 (!) 58     Resp 09/07/21 1206 18     Temp 09/07/21 1206 98.4 F (36.9 C)     Temp Source 09/07/21 1206 Oral     SpO2 09/07/21 1206 93 %     Weight 09/07/21 1209 163 lb (73.9 kg)     Height 09/07/21 1209 5' 2.5"  (1.588 m)     Head Circumference --      Peak Flow --      Pain Score 09/07/21 1208 0     Pain Loc --      Pain Edu? --      Excl. in [place]? --   TMAX(24)@     _________________________________________ Significant initial  Findings: Abnormal Labs Reviewed  SARS CORONAVIRUS 2 BY RT PCR - Abnormal; Notable for the following components:      Result Value   SARS Coronavirus 2 by RT PCR POSITIVE (*)    All other components within normal limits  CBC WITH DIFFERENTIAL/PLATELET - Abnormal; Notable for the following components:   RBC 3.51 (*)    Hemoglobin 10.1 (*)    HCT 30.5 (*)    RDW 15.7 (*)    All other components within normal limits  BASIC METABOLIC PANEL - Abnormal; Notable for the following components:   Glucose, Bld 249 (*)    BUN 66 (*)    Creatinine, Ser 3.45 (*)    Calcium 8.8 (*)    GFR, Estimated 13 (*)    All other components within normal limits  BRAIN NATRIURETIC PEPTIDE - Abnormal; Notable for the following components:   B Natriuretic Peptide 974.2 (*)    All other components within normal limits  D-DIMER, QUANTITATIVE - Abnormal; Notable for the following components:   D-Dimer, Quant 2.61 (*)    All other components within normal limits  TROPONIN I (HIGH SENSITIVITY) - Abnormal; Notable for the following components:   Troponin I (High Sensitivity) 30 (*)    All other components within normal limits  TROPONIN I (HIGH SENSITIVITY) - Abnormal; Notable for the following components:   Troponin I (High Sensitivity) 35 (*)    All other components within normal limits     _________________________ Troponin  30 - 35 ECG: Ordered Personally reviewed and interpreted by me showing: HR : 66 Rhythm: Sinus rhythm Borderline low voltage, extremity lead QTC 499   ____________________ This patient meets SIRS Criteria and may be septic.   The recent clinical data is shown below. Vitals:   09/07/21 1910 09/07/21 2000 09/07/21 2030 09/07/21 2100  BP: (!) 179/57 (!) 176/49  (!) 155/49 (!) 145/46  Pulse: 63 68 (!) 59 (!) 55  Resp: (!) 25 (!) 26 (!) 21 18  Temp:      TempSrc:      SpO2: 99% 99% 100% 98%  Weight:      Height:       WBC     Component Value Date/Time   WBC 7.7 09/07/2021 1225   LYMPHSABS 1.3 09/07/2021 1225   MONOABS 0.6 09/07/2021 1225   EOSABS 0.1 09/07/2021 1225   BASOSABS 0.0 09/07/2021 1225    Procalcitonin   Ordered   UA ordered    Results for orders placed or performed during the hospital encounter of 09/07/21  SARS Coronavirus 2 by RT PCR (hospital order, performed in [facility] hospital lab) *cepheid single result test* Anterior Nasal Swab     Status: Abnormal   Collection Time: 09/07/21 12:17 PM   Specimen: Anterior Nasal Swab  Result Value Ref Range Status   SARS Coronavirus 2 by RT PCR POSITIVE (A) NEGATIVE Final         _______________________________________________ Hospitalist was called for admission for CHF E gene 38.5 N2 40.1  The following Work up has been ordered so far:  Orders Placed This Encounter  Procedures   ED FAST [name] BEDSIDE   SARS Coronavirus 2 by RT PCR (hospital order, performed in [facility] hospital lab) *cepheid single result test* Anterior Nasal Swab   DG Chest 1 View   CT Chest Wo Contrast   NM PULMONARY VENT AND PERF (V/Q Scan)   CBC with Differential   Basic metabolic panel   Brain natriuretic peptide   D-dimer, quantitative   Magnesium   Cardiac monitoring   Consult for [facility] Admission   Airborne and Contact precautions   Bipap   EKG 12-Lead   EKG 12-Lead   Admit to Inpatient (patient's expected length of stay will be greater than 2 midnights or inpatient only procedure)     OTHER Significant initial  Findings:  labs showing:    Recent Labs  Lab 09/07/21 1225 09/07/21 1751  NA 140  --   K 4.1  --   CO2 22  --   GLUCOSE 249*  --   BUN 66*  --   CREATININE 3.45*  --   CALCIUM 8.8*  --   MG  --  2.0    Cr    stable,    Lab Results  Component  Value Date   CREATININE 3.45 (H) 09/07/2021   CREATININE 3.28 (H) 05/16/2021   CREATININE 3.28 (H) 12/26/2020    No results for input(s): "AST", "ALT", "ALKPHOS", "BILITOT", "PROT", "ALBUMIN" in the last 168 hours. Lab Results  Component Value Date   CALCIUM 8.8 (L) 09/07/2021   PHOS 4.2 06/25/2020        Plt: Lab Results  Component Value Date   PLT 295 09/07/2021     COVID-19 Labs  Recent Labs    09/07/21 1751  DDIMER 2.61*    Lab Results  Component Value Date   SARSCOV2NAA POSITIVE (A) 09/07/2021   SARSCOV2NAA NEGATIVE 12/26/2020   SARSCOV2NAA NEGATIVE 10/04/2020   SARSCOV2NAA NEGATIVE 06/24/2020   CT VALUE E gene 38.5 N2 40.1  Venous  Blood Gas ordered ABG    Component Value Date/Time   PHART 7.467 (H) 10/23/2018 1426   PCO2ART 28.0 (L) 10/23/2018 1426   PO2ART 76.0 (L) 10/23/2018 1426   HCO3 22.4 10/23/2018 1443   TCO2 23 11/13/2018 1002   ACIDBASEDEF 2.0 10/23/2018 1443   O2SAT 64.0 10/23/2018 1443       Recent Labs  Lab 09/07/21 1225  WBC 7.7  NEUTROABS 5.6  HGB 10.1*  HCT 30.5*  MCV 86.9  PLT 295    HG/HCT  stable     Component Value Date/Time   HGB 10.1 (L) 09/07/2021 1225   HGB 10.8 (L) 10/16/2018 1123   HCT 30.5 (L) 09/07/2021 1225   HCT 33.0 (L) 10/16/2018 1123   MCV 86.9 09/07/2021 1225   MCV 93 10/16/2018 1123     Cardiac Panel (last 3 results) No results for input(s): "CKTOTAL", "CKMB", "TROPONINI", "RELINDX" in the last 72 hours.  .car BNP (last 3 results) Recent Labs    12/26/20 1340 09/07/21 1225  BNP 632.8* 974.2*          Cultures:    Component Value Date/Time   SDES URINE, RANDOM 06/24/2020 1910   SPECREQUEST  06/24/2020 1910    NONE Performed at [facility] [address], [place], [place] [address]    CULT MULTIPLE SPECIES PRESENT, SUGGEST RECOLLECTION (A) 06/24/2020 1910   REPTSTATUS 06/26/2020 FINAL 06/24/2020 1910     Radiological Exams on Admission: CT Chest Wo Contrast  Result Date:  09/07/2021 CLINICAL DATA:  Respiratory illness EXAM: CT CHEST WITHOUT CONTRAST TECHNIQUE: Multidetector CT imaging of the chest was performed following the standard protocol without IV contrast. RADIATION DOSE REDUCTION: This exam was performed according to the departmental dose-optimization program which includes automated exposure control, adjustment of the mA and/or kV according to patient size and/or use of iterative reconstruction technique. COMPARISON:  Chest CT dated February 04, 2021 FINDINGS: Cardiovascular: Mild cardiomegaly. No pericardial effusion. Prior transcatheter aortic valve replacement. Unchanged pericardial calcifications. Normal caliber thoracic aorta with moderate calcified plaque. Severe three-vessel coronary artery calcifications. Mediastinum/Nodes: Patulous esophagus. Thyroid is unremarkable. No pathologically enlarged lymph nodes seen in the chest. Lungs/Pleura: Central airways are patent. Septal thickening of the bilateral lower lungs. Small loculated right pleural effusion, increased in size when compared with prior exam. New trace left pleural effusion. No evidence of pleural thickening. Bibasilar atelectasis. No consolidation, pleural effusion or pneumothorax. Upper Abdomen: Stable left adrenal gland adenoma. No acute abnormality. Musculoskeletal: Loop recorder device. IMPRESSION: 1. Small loculated right pleural effusion, increased in size when compared with prior CT exam. New trace left pleural effusion. 2. Mild pulmonary edema and bibasilar atelectasis. 3.  Aortic Atherosclerosis (ICD10-I70.0). Electronically Signed   By: Yetta Glassman [name]   On: 09/07/2021 17:11   DG Chest 1 View  Result Date: 09/07/2021 CLINICAL DATA:  Shortness of breath. EXAM: CHEST  1 VIEW COMPARISON:  September 06, 2021. FINDINGS: Stable cardiomegaly. Status post transcatheter aortic valve repair. Stable right-sided pleural effusion is noted with associated right basilar atelectasis or infiltrate. Bony  thorax is unremarkable. IMPRESSION: Stable right-sided pleural effusion with associated right basilar atelectasis or infiltrate. Electronically Signed   By: Marijo Conception [name]   On: 09/07/2021 13:01   DG Chest 2 View  Result Date: 09/06/2021 CLINICAL DATA:  Heart failure, unspecified chronicity. Shortness of breath and edema. EXAM: CHEST - 2 VIEW COMPARISON:  Radiographs 12/26/2020 and 10/04/2020.  CT 02/04/2021. FINDINGS: Stable cardiomegaly and anterior pericardial calcification post TAVR procedure. A small right pleural effusion has enlarged compared with the most recent prior radiographs with extension into the fissures. There is no edema, confluent airspace opacity or pneumothorax. The bones appear unremarkable. Loop recorder is present in the anterior chest wall. IMPRESSION: Chronic cardiomegaly with interval increased right pleural effusion extending into the fissures. No edema. Electronically Signed   By: Caryl Comes.D.  On: 09/06/2021 13:52   _______________________________________________________________________________________________________ Latest  Blood pressure (!) 145/46, pulse (!) 55, temperature 97.7 F (36.5 C), temperature source Oral, resp. rate 18, height 5' 2.5" (1.588 m), weight 73.9 kg, SpO2 98 %.   Vitals  labs and radiology finding personally reviewed  Review of Systems:    Pertinent positives include:  , fatigue, shortness of breath at rest.   dyspnea on exertion,  Bilateral lower extremity swelling  Constitutional:  No weight loss, night sweats, Fevers, chills weight loss  HEENT:  No headaches, Difficulty swallowing,Tooth/dental problems,Sore throat,  No sneezing, itching, ear ache, nasal congestion, post nasal drip,  Cardio-vascular:  No chest pain, Orthopnea, PND, anasarca, dizziness, palpitations.no GI:  No heartburn, indigestion, abdominal pain, nausea, vomiting, diarrhea, change in bowel habits, loss of appetite, melena, blood in stool,  hematemesis Resp:  no No excess mucus, no productive cough, No non-productive cough, No coughing up of blood.No change in color of mucus.No wheezing. Skin:  no rash or lesions. No jaundice GU:  no dysuria, change in color of urine, no urgency or frequency. No straining to urinate.  No flank pain.  Musculoskeletal:  No joint pain or no joint swelling. No decreased range of motion. No back pain.  Psych:  No change in mood or affect. No depression or anxiety. No memory loss.  Neuro: no localizing neurological complaints, no tingling, no weakness, no double vision, no gait abnormality, no slurred speech, no confusion  All systems reviewed and apart from [place] all are negative _______________________________________________________________________________________________ Past Medical History:   Past Medical History:  Diagnosis Date   Arthritis    Cerebrovascular disease    CKD (chronic kidney disease)    Sees Dr Hollie Salk   Coronary artery disease    Depression    Diabetes ([place])    INSULIN DEPENDENT   Diabetic peripheral neuropathy (HCC)    Diabetic retinopathy ([place])    Diverticulitis    GERD (gastroesophageal reflux disease)    History of CVA (cerebrovascular accident)    x 2 no residulal   Hyperlipidemia    Hypertension    Obesity    Renal lesion    S/P TAVR (transcatheter aortic valve replacement) 11/13/2018   s/p TAVR with a 79mm Edwards S3U via the TF approach by Dr. [name] and Dr. Angelena Form   Severe aortic stenosis       Past Surgical History:  Procedure Laterality Date   ABDOMINAL HYSTERECTOMY     AV FISTULA PLACEMENT Left 12/18/2017   Procedure: ARTERIOVENOUS (AV) FISTULA CREATION ARM;  Surgeon: Marty Heck, MD;  Location: [facility] OR;  Service: Vascular;  Laterality: Left;   bilateral cataract surgery     CARDIAC CATHETERIZATION  01/07/2014   DR Einar Gip   COLON RESECTION  02/1999   COLONOSCOPY     COLOSTOMY  02/1999   COLOSTOMY CLOSURE  07/1999   EYE SURGERY Left  2019   FISTULA SUPERFICIALIZATION Left 06/29/2018   Procedure: FISTULA SUPERFICIALIZATION LEFT BRACHIOCEPHALIC;  Surgeon: Marty Heck, MD;  Location: [place];  Service: Vascular;  Laterality: Left;   [place] N/A 01/07/2014   Procedure: LEFT HEART CATHETERIZATION WITH CORONARY ANGIOGRAM;  Surgeon: Laverda Page, MD;  Location: [facility] CATH LAB;  Service: Cardiovascular;  Laterality: N/A;   LOOP RECORDER INSERTION N/A 02/17/2020   Procedure: LOOP RECORDER INSERTION;  Surgeon: Deboraha Sprang, MD;  Location: [facility] CV LAB;  Service: Cardiovascular;  Laterality: N/A;   middle cerebral artery stent placement Right  OTHER SURGICAL HISTORY     laser surgery   PTCA  01/07/2014   DES to RCA    DR Einar Gip   right knee surgery Right    for infection   RIGHT/LEFT HEART CATH AND CORONARY ANGIOGRAPHY N/A 10/23/2018   Procedure: RIGHT/LEFT HEART CATH AND CORONARY ANGIOGRAPHY;  Surgeon: Nigel Mormon, MD;  Location: [facility] CV LAB;  Service: Cardiovascular;  Laterality: N/A;   TEE WITHOUT CARDIOVERSION N/A 11/13/2018   Procedure: TRANSESOPHAGEAL ECHOCARDIOGRAM (TEE);  Surgeon: Burnell Blanks, MD;  Location: [place];  Service: Open Heart Surgery;  Laterality: N/A;   TRANSCATHETER AORTIC VALVE REPLACEMENT, TRANSFEMORAL  11/13/2018   TRANSCATHETER AORTIC VALVE REPLACEMENT, TRANSFEMORAL N/A 11/13/2018   Procedure: TRANSCATHETER AORTIC VALVE REPLACEMENT, TRANSFEMORAL;  Surgeon: Burnell Blanks, MD;  Location: [facility];  Service: Open Heart Surgery;  Laterality: N/A;    Social History:  Ambulatory  walker     reports that she has never smoked. She has never used smokeless tobacco. She reports that she does not drink alcohol and does not use drugs.    Family History:   Family History  Problem Relation Age of Onset   Diabetes Mother    Heart disease Mother    Prostate cancer Father    Hypertension Brother    Prostate cancer  Brother    ______________________________________________________________________________________________ Allergies: No Known Allergies   Prior to Admission medications   Medication Sig Start Date End Date Taking? Authorizing Provider  acetaminophen (TYLENOL) 325 MG tablet Take 650 mg by mouth every 6 (six) hours as needed for moderate pain or headache.     [provider]  amLODipine (NORVASC) 10 MG tablet TAKE 1 TABLET BY MOUTH EVERY DAY 10/30/20   Patwardhan, Manish J, MD  brimonidine (ALPHAGAN) 0.2 % ophthalmic solution Place 1 drop into the left eye daily. 04/19/19   [provider]  ferrous sulfate 325 (65 FE) MG tablet Take 325 mg by mouth daily with breakfast.    [provider]  HUMALOG KWIKPEN 100 UNIT/ML KwikPen Inject 4-8 Units into the skin See admin instructions. Inject 4-8 units into the skin 3 times a day before meals, per sliding scale 05/28/18   [provider]  hydrALAZINE (APRESOLINE) 50 MG tablet Take 1 tablet (50 mg total) by mouth every 8 (eight) hours. 02/26/20   Love, Ivan Anchors, PA-C  insulin glargine (LANTUS) 100 UNIT/ML injection Inject 0.09 mLs (9 Units total) into the skin at bedtime. Patient taking differently: Inject 6 Units into the skin at bedtime. 06/27/20   Madalyn Rob, MD  isosorbide mononitrate (IMDUR) 120 MG 24 hr tablet Take 2 tablets (240 mg total) by mouth daily. 08/23/21 02/19/22  Patwardhan, Reynold Bowen, MD  labetalol (NORMODYNE) 200 MG tablet Take 1 tablet (200 mg total) by mouth 2 (two) times daily. 11/05/20   Patwardhan, Manish J, MD  REPATHA SURECLICK 299 MG/ML SOAJ INJECT 1 ML INTO THE SKIN EVERY 14 (FOURTEEN) DAYS. 02/05/21   Patwardhan, Manish J, MD  senna-docusate (SENOKOT-S) 8.6-50 MG tablet Take 2 tablets by mouth 2 (two) times daily. Patient not taking: Reported on 07/14/2021 02/26/20   Love, Ivan Anchors, PA-C  torsemide (DEMADEX) 20 MG tablet Take 2 tablets (40 mg total) by mouth daily. 02/26/20   Love, Ivan Anchors, PA-C   venlafaxine XR (EFFEXOR-XR) 150 MG 24 hr capsule Take 1 capsule (150 mg total) by mouth daily. 02/27/20   Bary Leriche, PA-C    ___________________________________________________________________________________________________ Physical Exam:    09/07/2021  9:00 PM 09/07/2021    8:30 PM 09/07/2021    8:00 PM  Vitals with BMI  Systolic 092 330 076  Diastolic 46 49 49  Pulse 55 59 68     1. General:  in No  Acute distress   Chronically ill   -appearing 2. Psychological: Alert and   Oriented 3. Head/ENT:   Moist   Mucous Membranes                          Head Non traumatic, neck supple                         Poor Dentition 4. SKIN: normal   Skin turgor,  Skin clean Dry and intact no rash 5. Heart: Regular rate and rhythm no  Murmur, no Rub or gallop 6. Lungs:   no wheezes or crackles  on BIpAP 7. Abdomen: Soft,  non-tender, Non distended   obese  bowel sounds present 8. Lower extremities: no clubbing, cyanosis, trace edema 9. Neurologically Grossly intact, moving all 4 extremities equally   10. MSK: Normal range of motion    Chart has been reviewed  ______________________________________________________________________________________________  Assessment/Plan  81 y.o. female with medical history significant of CAD, HTN, D 2, CKD stage V, CVA 2015 2020, severe AS sp TAVR    Admitted for acute on chornic combned systolic/diastolic CHF   Present on Admission:  Acute on chronic combined systolic and diastolic CHF (congestive heart failure) ([place])  Coronary artery disease of native artery of native heart with stable angina pectoris (HCC)  CKD (chronic kidney disease), stage IV ([place])  Essential hypertension  COVID-19 virus infection  DM (diabetes mellitus), type 2 with renal complications (HCC)  Elevated troponin     Acute on chronic combined systolic and diastolic CHF (congestive heart failure) ([place]) - Pt diagnosed with CHF based on presence of the following  OA,  rales on exam, Pulmonary edema on CXR, and  bilateral leg edema,    pleural effusion  With noted response to IV diuretic in ER  admit on telemetry,  cycle cardiac enzymes, Troponin 30 -35   obtain serial ECG  to evaluate for ischemia as a cause of heart failure  monitor daily weight:  Filed Weights   09/07/21 1209  Weight: 73.9 kg   Last BNP BNP (last 3 results) Recent Labs    12/26/20 1340 09/07/21 1225  BNP 632.8* 974.2*        diurese with IV lasix and monitor orthostatics and creatinine to avoid over diuresis. Recent echogram   ACE/ARBi  Contraindicated    cardiology consulted    Coronary artery disease of native artery of native heart with stable angina pectoris (HCC) On Aspirin, repatha and labetalol  followed by cardiolgoy   CKD (chronic kidney disease), stage IV (HCC)  -chronic avoid nephrotoxic medications such as NSAIDs, Vanco Zosyn combo,  avoid hypotension, continue to follow renal function   Essential hypertension Continue home meds Norvasc 10 mg p.o. daily hydralazine 50 mg every 8 hours Imdur 240 mg daily labetalol 200 mg twice a day    Severe aortic stenosis Status post TAVR  COVID-19 virus infection COVID infection -incidental finding patient is   So far mild URI symptoms CXR with no infiltrates Symptoms likely more secondary to CHF exacerbation Cycle Threshold >32 (indicates low levels of virus)          Obtain inflammatory markers Pronate as  needed   Supportive measures Avoid over aggressive fluid resuscitation Airborne precautions   DM (diabetes mellitus), type 2 with renal complications ([place])  - Order Sensitive SSI   - continue home insulin regimen  Lantus 6 units,  -  check TSH and HgA1C  - Hold by mouth medications     Elevated troponin In the setting of CKD. No chest pain EKG nonischemic.  Troponin flat. In the setting of demand ischemia and CHF    Other plan as per orders.  DVT prophylaxis:   Lovenox       Code  Status:    Code Status: Prior FULL CODE   as per patient   I had personally discussed CODE STATUS with patient     Family Communication:   Family not at  Bedside    Disposition Plan:      To home once workup is complete and patient is stable   Following barriers for discharge:                            Electrolytes corrected                                                        Will need consultants to evaluate patient prior to discharge                       Would benefit from PT/OT eval prior to DC  Ordered                     Consults called:    emailed cardiology   Admission status:  ED Disposition     ED Disposition  [place]: [place] [[id number]]  Level of Care: Progressive [102]  Admit to Progressive based on following criteria: RESPIRATORY PROBLEMS hypoxemic/hypercapnic respiratory failure that is responsive to NIPPV (BiPAP) or High Flow Nasal Cannula (6-80 lpm). Frequent assessment/intervention, no > Q2 hrs < Q4 hrs, to maintain oxygenation and pulmonary hygiene.  May admit patient to [facility] or [facility] if equivalent level of care is available:: No  Covid Evaluation: Confirmed COVID Positive  Diagnosis: Acute on chronic combined systolic and diastolic CHF (congestive heart failure) Post Acute [facility]) [[id number]]  Admitting Physician: Toy Baker [[id number]]  Attending Physician: Toy Baker [[id number]]  Certification:: I certify this patient will need inpatient services for at least 2 midnights  Estimated Length of Stay: 2           inpatient     I Expect 2 midnight stay secondary to severity of patient's current illness need for inpatient interventions justified by the following:  hemodynamic instability despite optimal treatment ( hypoxia, )   Severe lab/radiological/exam abnormalities including:    CHF and extensive comorbidities including:  DM2   CHF  CAD  CKD   That are currently affecting  medical management.   I expect  patient to be hospitalized for 2 midnights requiring inpatient medical care.  Patient is at high risk for adverse outcome (such as loss of life or disability) if not treated.  Indication for inpatient stay as follows:    Hemodynamic instability despite maximal medical therapy,    New or worsening hypoxia  Need for  need for biPAP    Level of care          progressive tele indefinitely please discontinue once patient no longer qualifies COVID-19 Labs    Lab Results  Component Value Date   [place] (A) 09/07/2021     Precautions: admitted as  covid positive Airborne and Contact precautions       [name] 09/07/2021, 10:59 PM    [facility]     after 2 AM please page floor coverage PA If 7AM-7PM, please contact the day team taking care of the patient using [email or web address]   Patient was evaluated in the context of the global COVID-19 pandemic, which necessitated consideration that the patient might be at risk for infection with the SARS-CoV-2 virus that causes COVID-19. Institutional protocols and algorithms that pertain to the evaluation of patients at risk for COVID-19 are in a state of rapid change based on information released by regulatory bodies including the CDC and federal and state organizations. These policies and algorithms were followed during the patient's care.

## 2021-09-07 NOTE — Assessment & Plan Note (Signed)
-   Pt diagnosed with CHF based on presence of the following  OA, rales on exam, Pulmonary edema on CXR, and  bilateral leg edema,    pleural effusion  With noted response to IV diuretic in ER  admit on telemetry,  cycle cardiac enzymes, Troponin 30 -35   obtain serial ECG  to evaluate for ischemia as a cause of heart failure  monitor daily weight:  Filed Weights   09/07/21 1209  Weight: 73.9 kg   Last BNP BNP (last 3 results) Recent Labs    12/26/20 1340 09/07/21 1225  BNP 632.8* 974.2*        diurese with IV lasix and monitor orthostatics and creatinine to avoid over diuresis. Recent echogram   ACE/ARBi  Contraindicated    cardiology consulted

## 2021-09-07 NOTE — ED Provider Notes (Signed)
[place] EMERGENCY DEPARTMENT Provider Note   CSN: [id number] Arrival date & time: 09/07/21  1113     History  Chief Complaint  Patient presents with   Shortness of Breath    [name] is a 81 y.o. female.   Shortness of Breath Patient presents for shortness of breath.  Medical history includes CVA, CAD, CKD, HTN, HLD, CHF, TAVR, T2DM, neuropathy, retinopathy, peripheral vascular disease with claudication, sleep apnea, GERD, depression, and arthritis.  Today, she went to PCPs office.  Lab work from that visit showed baseline anemia, no leukocytosis baseline CKD, normal electrolytes, BNP of 974, and troponin of 30, which is consistent with her baseline.  Chest x-ray showed a stable right-sided pleural effusion.  EKG shows subtle T wave abnormalities which are consistent with prior EKGs.  Patient reports that she has had her symptoms of dyspnea over the past 3 to 4 days.  Symptoms are worse with exertion.  Previously, she was on 60 mg of torsemide daily.  Her primary care doctor increased her dose to 60 mg in the morning and 40 mg in the evening.  She has had improvement in her bilateral lower extremity swelling since this change.  She continued to have shortness of breath, including at rest.  She denies any areas of pain.     Home Medications Prior to Admission medications   Medication Sig Start Date End Date Taking? Authorizing Provider  acetaminophen (TYLENOL) 325 MG tablet Take 650 mg by mouth every 6 (six) hours as needed for moderate pain or headache.     [provider]  amLODipine (NORVASC) 10 MG tablet TAKE 1 TABLET BY MOUTH EVERY DAY 10/30/20   Patwardhan, Manish J, MD  brimonidine (ALPHAGAN) 0.2 % ophthalmic solution Place 1 drop into the left eye daily. 04/19/19   [provider]  ferrous sulfate 325 (65 FE) MG tablet Take 325 mg by mouth daily with breakfast.    [provider]  HUMALOG KWIKPEN 100 UNIT/ML KwikPen Inject 4-8  Units into the skin See admin instructions. Inject 4-8 units into the skin 3 times a day before meals, per sliding scale 05/28/18   [provider]  hydrALAZINE (APRESOLINE) 50 MG tablet Take 1 tablet (50 mg total) by mouth every 8 (eight) hours. 02/26/20   Love, Ivan Anchors, PA-C  insulin glargine (LANTUS) 100 UNIT/ML injection Inject 0.09 mLs (9 Units total) into the skin at bedtime. Patient taking differently: Inject 6 Units into the skin at bedtime. 06/27/20   Madalyn Rob, MD  isosorbide mononitrate (IMDUR) 120 MG 24 hr tablet Take 2 tablets (240 mg total) by mouth daily. 08/23/21 02/19/22  Patwardhan, Reynold Bowen, MD  labetalol (NORMODYNE) 200 MG tablet Take 1 tablet (200 mg total) by mouth 2 (two) times daily. 11/05/20   Patwardhan, Manish J, MD  REPATHA SURECLICK 213 MG/ML SOAJ INJECT 1 ML INTO THE SKIN EVERY 14 (FOURTEEN) DAYS. 02/05/21   Patwardhan, Manish J, MD  senna-docusate (SENOKOT-S) 8.6-50 MG tablet Take 2 tablets by mouth 2 (two) times daily. Patient not taking: Reported on 07/14/2021 02/26/20   Love, Ivan Anchors, PA-C  torsemide (DEMADEX) 20 MG tablet Take 2 tablets (40 mg total) by mouth daily. 02/26/20   Love, Ivan Anchors, PA-C  venlafaxine XR (EFFEXOR-XR) 150 MG 24 hr capsule Take 1 capsule (150 mg total) by mouth daily. 02/27/20   Bary Leriche, PA-C      Allergies    Patient has no known allergies.  Review of Systems   Review of Systems  Respiratory:  Positive for shortness of breath.   Cardiovascular:  Positive for leg swelling.  All other systems reviewed and are negative.   Physical Exam Updated Vital Signs BP (!) 156/51   Pulse (!) 59   Temp 97.7 F (36.5 C) (Oral)   Resp 11   Ht 5' 2.5" (1.588 m)   Wt 73.9 kg   SpO2 98%   BMI 29.34 kg/m  Physical Exam Vitals and nursing note reviewed.  Constitutional:      General: She is not in acute distress.    Appearance: She is well-developed. She is not ill-appearing, toxic-appearing or diaphoretic.  HENT:     Head:  Normocephalic and atraumatic.     Mouth/Throat:     Mouth: Mucous membranes are moist.     Pharynx: Oropharynx is clear.  Eyes:     Conjunctiva/sclera: Conjunctivae normal.  Cardiovascular:     Rate and Rhythm: Normal rate and regular rhythm.     Heart sounds: No murmur heard. Pulmonary:     Effort: Pulmonary effort is normal. No respiratory distress.     Breath sounds: Examination of the right-lower field reveals decreased breath sounds. Examination of the left-lower field reveals decreased breath sounds. Decreased breath sounds and rales present.  Chest:     Chest wall: No tenderness.  Abdominal:     Palpations: Abdomen is soft.     Tenderness: There is no abdominal tenderness.  Musculoskeletal:        General: No swelling. Normal range of motion.     Cervical back: Normal range of motion and neck supple.     Right lower leg: Edema present.     Left lower leg: Edema present.  Skin:    General: Skin is warm and dry.     Coloration: Skin is not cyanotic or pale.  Neurological:     General: No focal deficit present.     Mental Status: She is alert and oriented to person, place, and time.  Psychiatric:        Mood and Affect: Mood normal.        Behavior: Behavior normal.     ED Results / Procedures / Treatments   Labs (all labs ordered are listed, but only abnormal results are displayed) Labs Reviewed  SARS CORONAVIRUS 2 BY RT PCR - Abnormal; Notable for the following components:      Result Value   SARS Coronavirus 2 by RT PCR POSITIVE (*)    All other components within normal limits  CBC WITH DIFFERENTIAL/PLATELET - Abnormal; Notable for the following components:   RBC 3.51 (*)    Hemoglobin 10.1 (*)    HCT 30.5 (*)    RDW 15.7 (*)    All other components within normal limits  BASIC METABOLIC PANEL - Abnormal; Notable for the following components:   Glucose, Bld 249 (*)    BUN 66 (*)    Creatinine, Ser 3.45 (*)    Calcium 8.8 (*)    GFR, Estimated 13 (*)    All  other components within normal limits  BRAIN NATRIURETIC PEPTIDE - Abnormal; Notable for the following components:   B Natriuretic Peptide 974.2 (*)    All other components within normal limits  D-DIMER, QUANTITATIVE - Abnormal; Notable for the following components:   D-Dimer, Quant 2.61 (*)    All other components within normal limits  CBG MONITORING, ED - Abnormal; Notable for the following components:  Glucose-Capillary 202 (*)    All other components within normal limits  TROPONIN I (HIGH SENSITIVITY) - Abnormal; Notable for the following components:   Troponin I (High Sensitivity) 30 (*)    All other components within normal limits  TROPONIN I (HIGH SENSITIVITY) - Abnormal; Notable for the following components:   Troponin I (High Sensitivity) 35 (*)    All other components within normal limits  EXPECTORATED SPUTUM ASSESSMENT W GRAM STAIN, RFLX TO RESP C  MAGNESIUM  BLOOD GAS, VENOUS  VITAMIN B12  FOLATE  IRON AND TIBC  FERRITIN  RETICULOCYTES  HEMOGLOBIN A1C  CK  PHOSPHORUS  PREALBUMIN  TSH  URINALYSIS, COMPLETE (UACMP) WITH MICROSCOPIC  C-REACTIVE PROTEIN  COMPREHENSIVE METABOLIC PANEL  LACTATE DEHYDROGENASE  PROCALCITONIN  CBC WITH DIFFERENTIAL/PLATELET  COMPREHENSIVE METABOLIC PANEL  C-REACTIVE PROTEIN  D-DIMER, QUANTITATIVE  FERRITIN  MAGNESIUM  PHOSPHORUS  TROPONIN I (HIGH SENSITIVITY)  TROPONIN I (HIGH SENSITIVITY)    EKG None  Radiology CT Chest Wo Contrast  Result Date: 09/07/2021 CLINICAL DATA:  Respiratory illness EXAM: CT CHEST WITHOUT CONTRAST TECHNIQUE: Multidetector CT imaging of the chest was performed following the standard protocol without IV contrast. RADIATION DOSE REDUCTION: This exam was performed according to the departmental dose-optimization program which includes automated exposure control, adjustment of the mA and/or kV according to patient size and/or use of iterative reconstruction technique. COMPARISON:  Chest CT dated February 04, 2021 FINDINGS: Cardiovascular: Mild cardiomegaly. No pericardial effusion. Prior transcatheter aortic valve replacement. Unchanged pericardial calcifications. Normal caliber thoracic aorta with moderate calcified plaque. Severe three-vessel coronary artery calcifications. Mediastinum/Nodes: Patulous esophagus. Thyroid is unremarkable. No pathologically enlarged lymph nodes seen in the chest. Lungs/Pleura: Central airways are patent. Septal thickening of the bilateral lower lungs. Small loculated right pleural effusion, increased in size when compared with prior exam. New trace left pleural effusion. No evidence of pleural thickening. Bibasilar atelectasis. No consolidation, pleural effusion or pneumothorax. Upper Abdomen: Stable left adrenal gland adenoma. No acute abnormality. Musculoskeletal: Loop recorder device. IMPRESSION: 1. Small loculated right pleural effusion, increased in size when compared with prior CT exam. New trace left pleural effusion. 2. Mild pulmonary edema and bibasilar atelectasis. 3.  Aortic Atherosclerosis (ICD10-I70.0). Electronically Signed   By: Yetta Glassman [name]   On: 09/07/2021 17:11   DG Chest 1 View  Result Date: 09/07/2021 CLINICAL DATA:  Shortness of breath. EXAM: CHEST  1 VIEW COMPARISON:  September 06, 2021. FINDINGS: Stable cardiomegaly. Status post transcatheter aortic valve repair. Stable right-sided pleural effusion is noted with associated right basilar atelectasis or infiltrate. Bony thorax is unremarkable. IMPRESSION: Stable right-sided pleural effusion with associated right basilar atelectasis or infiltrate. Electronically Signed   By: Marijo Conception [name]   On: 09/07/2021 13:01   DG Chest 2 View  Result Date: 09/06/2021 CLINICAL DATA:  Heart failure, unspecified chronicity. Shortness of breath and edema. EXAM: CHEST - 2 VIEW COMPARISON:  Radiographs 12/26/2020 and 10/04/2020.  CT 02/04/2021. FINDINGS: Stable cardiomegaly and anterior pericardial calcification post  TAVR procedure. A small right pleural effusion has enlarged compared with the most recent prior radiographs with extension into the fissures. There is no edema, confluent airspace opacity or pneumothorax. The bones appear unremarkable. Loop recorder is present in the anterior chest wall. IMPRESSION: Chronic cardiomegaly with interval increased right pleural effusion extending into the fissures. No edema. Electronically Signed   By: Richardean Sale [name]   On: 09/06/2021 13:52    Procedures Ultrasound ED Peripheral IV (Provider)  Date/Time: 09/07/2021 6:23 PM  Performed by: Godfrey Pick, MD Authorized by: Godfrey Pick, MD   Procedure details:    Indications: multiple failed IV attempts     Skin Prep: chlorhexidine gluconate     Location:  Left anterior forearm   Angiocath:  18 G   Bedside Ultrasound Guided: Yes     Images: not archived     Patient tolerated procedure without complications: Yes     Dressing applied: Yes       Medications Ordered in ED Medications  insulin aspart (novoLOG) injection 0-9 Units (has no administration in time range)  guaiFENesin-dextromethorphan (ROBITUSSIN DM) 100-10 MG/5ML syrup 10 mL (has no administration in time range)  chlorpheniramine-HYDROcodone (TUSSIONEX) 10-8 MG/5ML suspension 5 mL (has no administration in time range)  furosemide (LASIX) injection 80 mg (80 mg Intravenous Given 09/07/21 1841)  venlafaxine XR (EFFEXOR-XR) 24 hr capsule 150 mg (150 mg Oral Given 09/07/21 1839)  nitroGLYCERIN (NITROGLYN) 2 % ointment 1 inch (1 inch Topical Given 09/07/21 1842)    ED Course/ Medical Decision Making/ A&P                           Medical Decision Making Amount and/or Complexity of Data Reviewed Labs: ordered. Radiology: ordered.  Risk Prescription drug management. Decision regarding hospitalization.   This patient presents to the ED for concern of shortness of breath, this involves an extensive number of treatment options, and is a complaint  that carries with it a high risk of complications and morbidity.  The differential diagnosis includes CHF exacerbation, pulmonary edema, pneumonia, PE, anemia, acidosis   Co morbidities that complicate the patient evaluation  CVA, CAD, CKD, HTN, HLD, CHF, TAVR, T2DM, neuropathy, retinopathy, peripheral vascular disease with claudication, sleep apnea, GERD, depression, and arthritis   Additional history obtained:  Additional history obtained from N/A External records from outside source obtained and reviewed including EMR   Lab Tests:  I Ordered, and personally interpreted labs.  The pertinent results include: baseline anemia, no leukocytosis baseline CKD, normal electrolytes, BNP of 974, and troponin of 30, which is consistent with her baseline.  D-dimer is elevated at 2.61.  Imaging Studies ordered:  I ordered imaging studies including CT chest I independently visualized and interpreted imaging which showed pulmonary edema with small loculated right pleural effusion and trace left pleural effusion  I agree with the radiologist interpretation   Cardiac Monitoring: / EKG:  The patient was maintained on a cardiac monitor.  I personally viewed and interpreted the cardiac monitored which showed an underlying rhythm of: Sinus rhythm   Problem List / ED Course / Critical interventions / Medication management  Patient is 81 year old female presenting for 3 to 4 days of ongoing shortness of breath.  Shortness of breath is worsened with exertion.  She has been seen by her primary care doctor twice over the past 3 days.  She did undergo an increase in her dosing of torsemide and has had improvement in BLE edema.  On arrival in the ED, vital signs are notable for hypertension.  Patient is well-appearing and her breathing is only mildly labored.  She is able to speak in complete sentences.  Her SPO2 is normal on room air.  She does have bibasilar diminished lung sounds and crackles.  Lab work  from today's PCP visit was reviewed.  Results were notable for an elevated BNP.  Patient reports that she was sent here to rule out a pulmonary embolism.  Currently, her GFR  is below the threshold for contrasted study.  A noncontrasted CT scan was ordered to assess for possible pneumonia.  D-dimer was ordered.  Patient was ordered dose of IV Lasix.  While in ED, patient had hypoxia.  She was placed on 2 L of supplemental oxygen by nasal cannula.  After multiple failed IV attempts, I placed a peripheral IV under ultrasound guidance.  At this time, patient was having increased work of breathing.  BiPAP was initiated.  On bedside ultrasound, patient has biapical B-lines.  Her blood pressure remains elevated.  NTG was ordered.  Patient tolerated BiPAP well.  On reassessment, she had had approximately 400 cc of urine output.  D-dimer came back elevated at 2.61.  VQ scan was ordered.  Given her severe symptoms at rest, patient to be admitted.  I ordered medication including Lasix for diuresis; NTG for hypertension Reevaluation of the patient after these medicines showed that the patient improved I have reviewed the patients home medicines and have made adjustments as needed   Social Determinants of Health:  Has access to outpatient care  CRITICAL CARE Performed by: Godfrey Pick   Total critical care time: 35 minutes  Critical care time was exclusive of separately billable procedures and treating other patients.  Critical care was necessary to treat or prevent imminent or life-threatening deterioration.  Critical care was time spent personally by me on the following activities: development of treatment plan with patient and/or surrogate as well as nursing, discussions with consultants, evaluation of patient's response to treatment, examination of patient, obtaining history from patient or surrogate, ordering and performing treatments and interventions, ordering and review of laboratory studies, ordering  and review of radiographic studies, pulse oximetry and re-evaluation of patient's condition.         Final Clinical Impression(s) / ED Diagnoses Final diagnoses:  Acute respiratory failure with hypoxia ([place])  Acute pulmonary edema [facility])    Rx / DC Orders ED Discharge Orders     None         Godfrey Pick, MD 09/07/21 2357

## 2021-09-07 NOTE — Assessment & Plan Note (Signed)
Continue home meds Norvasc 10 mg p.o. daily hydralazine 50 mg every 8 hours Imdur 240 mg daily labetalol 200 mg twice a day

## 2021-09-07 NOTE — Subjective & Objective (Signed)
Presents with SOB from PCP office, yesterday CXR was reportedly not showing [place] so PCP was worrited about PE Pt has been having leg edema Dry cough  No unilateral edema no hx of blood clots Denies any fever [name] to rule out DVT ADDENDUM, patient had [name] a few days ago which was negative for DVT, has known hx of diastolic CHF Pt hypoxic on arrival to [facility] started on [place] continued to have increased work of breathing and started on BIpap

## 2021-09-07 NOTE — ED Triage Notes (Signed)
Pt arrived POV from the drs office for [facility]. Pt was sent to rule out a PE. Per drs office they did blood work including a BNP and chest xray yesterday that did not show pulmonary edema so they are worried about a blood clot,

## 2021-09-07 NOTE — ED Notes (Signed)
ED TO INPATIENT HANDOFF REPORT  ED Nurse Name and Phone #: Shirlee Limerick [phone]  S Name/Age/Gender [name] 81 y.o. female Room/Bed: 032C/032C  Code Status   Code Status: Prior  Home/SNF/Other Home Patient oriented to: self, place, time, and situation Is this baseline? Yes   Triage Complete: Triage complete  Chief Complaint Acute on chronic combined systolic and diastolic CHF (congestive heart failure) ([place]) [I50.43]  Triage Note Pt arrived POV from the drs office for [facility]. Pt was sent to rule out a PE. Per drs office they did blood work including a BNP and chest xray yesterday that did not show pulmonary edema so they are worried about a blood clot,    Allergies No Known Allergies  Level of Care/Admitting Diagnosis ED Disposition     ED Disposition  Admit   Condition  --   [place]: [place] [[id number]]  Level of Care: Progressive [102]  Admit to Progressive based on following criteria: RESPIRATORY PROBLEMS hypoxemic/hypercapnic respiratory failure that is responsive to NIPPV (BiPAP) or High Flow Nasal Cannula (6-80 lpm). Frequent assessment/intervention, no > Q2 hrs < Q4 hrs, to maintain oxygenation and pulmonary hygiene.  May admit patient to [facility] or [facility] if equivalent level of care is available:: No  Covid Evaluation: Confirmed COVID Positive  Diagnosis: Acute on chronic combined systolic and diastolic CHF (congestive heart failure) [facility]) [[id number]]  Admitting Physician: Toy Baker [[id number]]  Attending Physician: Toy Baker [[id number]]  Certification:: I certify this patient will need inpatient services for at least 2 midnights  Estimated Length of Stay: 2          B Medical/Surgery History Past Medical History:  Diagnosis Date   Arthritis    Cerebrovascular disease    CKD (chronic kidney disease)    Sees Dr Hollie Salk   Coronary artery disease    Depression    Diabetes ([place])    INSULIN DEPENDENT    Diabetic peripheral neuropathy ([place])    Diabetic retinopathy ([place])    Diverticulitis    GERD (gastroesophageal reflux disease)    History of CVA (cerebrovascular accident)    x 2 no residulal   Hyperlipidemia    Hypertension    Obesity    Renal lesion    S/P TAVR (transcatheter aortic valve replacement) 11/13/2018   s/p TAVR with a 71mm Edwards S3U via the TF approach by Dr. [name] and Dr. Angelena Form   Severe aortic stenosis    Past Surgical History:  Procedure Laterality Date   ABDOMINAL HYSTERECTOMY     AV FISTULA PLACEMENT Left 12/18/2017   Procedure: ARTERIOVENOUS (AV) FISTULA CREATION ARM;  Surgeon: Marty Heck, MD;  Location: [facility] OR;  Service: Vascular;  Laterality: Left;   bilateral cataract surgery     CARDIAC CATHETERIZATION  01/07/2014   DR Einar Gip   COLON RESECTION  02/1999   COLONOSCOPY     COLOSTOMY  02/1999   COLOSTOMY CLOSURE  07/1999   EYE SURGERY Left 2019   FISTULA SUPERFICIALIZATION Left 06/29/2018   Procedure: FISTULA SUPERFICIALIZATION LEFT BRACHIOCEPHALIC;  Surgeon: Marty Heck, MD;  Location: [place];  Service: Vascular;  Laterality: Left;   [place] N/A 01/07/2014   Procedure: LEFT HEART CATHETERIZATION WITH CORONARY ANGIOGRAM;  Surgeon: Laverda Page, MD;  Location: [facility] CATH LAB;  Service: Cardiovascular;  Laterality: N/A;   LOOP RECORDER INSERTION N/A 02/17/2020   Procedure: LOOP RECORDER INSERTION;  Surgeon: Deboraha Sprang, MD;  Location:  [place] INVASIVE CV LAB;  Service: Cardiovascular;  Laterality: N/A;   middle cerebral artery stent placement Right    OTHER SURGICAL HISTORY     laser surgery   PTCA  01/07/2014   DES to RCA    DR Einar Gip   right knee surgery Right    for infection   RIGHT/LEFT HEART CATH AND CORONARY ANGIOGRAPHY N/A 10/23/2018   Procedure: RIGHT/LEFT HEART CATH AND CORONARY ANGIOGRAPHY;  Surgeon: Nigel Mormon, MD;  Location: [place] CV LAB;  Service: Cardiovascular;   Laterality: N/A;   TEE WITHOUT CARDIOVERSION N/A 11/13/2018   Procedure: TRANSESOPHAGEAL ECHOCARDIOGRAM (TEE);  Surgeon: [place] Blanks, MD;  Location: [place];  Service: Open Heart Surgery;  Laterality: N/A;   TRANSCATHETER AORTIC VALVE REPLACEMENT, TRANSFEMORAL  11/13/2018   TRANSCATHETER AORTIC VALVE REPLACEMENT, TRANSFEMORAL N/A 11/13/2018   Procedure: TRANSCATHETER AORTIC VALVE REPLACEMENT, TRANSFEMORAL;  Surgeon: [place] Blanks, MD;  Location: [place];  Service: Open Heart Surgery;  Laterality: N/A;     A IV Location/Drains/Wounds Patient Lines/Drains/Airways Status     Active Line/Drains/Airways     Name Placement date Placement time Site Days   Peripheral IV 09/07/21 18 G Anterior;Right Forearm 09/07/21  1830  Forearm  less than 1   Fistula / Graft Left Upper arm Arteriovenous fistula 12/18/17  0900  Upper arm  1359            Intake/Output Last 24 hours No intake or output data in the 24 hours ending 09/07/21 2222  Labs/Imaging Results for orders placed or performed during the hospital encounter of 09/07/21 (from the past 48 hour(s))  SARS Coronavirus 2 by RT PCR (hospital order, performed in [place] hospital lab) *cepheid single result test* Anterior Nasal Swab     Status: Abnormal   Collection Time: 09/07/21 12:17 PM   Specimen: Anterior Nasal Swab  Result Value Ref Range   SARS Coronavirus 2 by RT PCR POSITIVE (A) NEGATIVE    Comment: (NOTE) SARS-CoV-2 target nucleic acids are DETECTED  SARS-CoV-2 RNA is generally detectable in upper respiratory specimens  during the acute phase of infection.  Positive results are indicative  of the presence of the identified virus, but do not rule out bacterial infection or co-infection with other pathogens not detected by the test.  Clinical correlation with patient history and  other diagnostic information is necessary to determine patient infection status.  The expected result is negative.  Fact Sheet for  Patients:   https://www.patel.info/   Fact Sheet for Healthcare Providers:   https://hall.com/    This test is not yet approved or cleared by the [place] FDA and  has been authorized for detection and/or diagnosis of SARS-CoV-2 by FDA under an Emergency Use Authorization (EUA).  This EUA will remain in effect (meaning this test can be used) for the duration of  the COVID-19 declaration under Section 564(b)(1)  of the Act, 21 U.S.C. section 360-bbb-3(b)(1), unless the authorization is terminated or revoked sooner.   Performed at [facility], [place] [address], [place], [place] [address]   CBC with Differential     Status: Abnormal   Collection Time: 09/07/21 12:25 PM  Result Value Ref Range   WBC 7.7 4.0 - 10.5 K/uL   RBC 3.51 (L) 3.87 - 5.11 MIL/uL   Hemoglobin 10.1 (L) 12.0 - 15.0 g/dL   HCT 30.5 (L) 36.0 - 46.0 %   MCV 86.9 80.0 - 100.0 fL   MCH 28.8 26.0 - 34.0 pg  MCHC 33.1 30.0 - 36.0 g/dL   RDW 15.7 (H) 11.5 - 15.5 %   Platelets 295 150 - 400 K/uL   nRBC 0.0 0.0 - 0.2 %   Neutrophils Relative % 72 %   Neutro Abs 5.6 1.7 - 7.7 K/uL   Lymphocytes Relative 17 %   Lymphs Abs 1.3 0.7 - 4.0 K/uL   Monocytes Relative 8 %   Monocytes Absolute 0.6 0.1 - 1.0 K/uL   Eosinophils Relative 2 %   Eosinophils Absolute 0.1 0.0 - 0.5 K/uL   Basophils Relative 0 %   Basophils Absolute 0.0 0.0 - 0.1 K/uL   Immature Granulocytes 1 %   Abs Immature Granulocytes 0.05 0.00 - 0.07 K/uL    Comment: Performed at [facility] [address], [place], [place] [address]  Basic metabolic panel     Status: Abnormal   Collection Time: 09/07/21 12:25 PM  Result Value Ref Range   Sodium 140 135 - 145 mmol/L   Potassium 4.1 3.5 - 5.1 mmol/L   Chloride 107 98 - 111 mmol/L   CO2 22 22 - 32 mmol/L   Glucose, Bld 249 (H) 70 - 99 mg/dL    Comment: Glucose reference range applies only to samples taken after fasting for at least 8 hours.    BUN 66 (H) 8 - 23 mg/dL   Creatinine, Ser 3.45 (H) 0.44 - 1.00 mg/dL   Calcium 8.8 (L) 8.9 - 10.3 mg/dL   GFR, Estimated 13 (L) >60 mL/min    Comment: (NOTE) Calculated using the CKD-EPI Creatinine Equation (2021)    Anion gap 11 5 - 15    Comment: Performed at [facility] [address], [place], [place] [address]  Brain natriuretic peptide     Status: Abnormal   Collection Time: 09/07/21 12:25 PM  Result Value Ref Range   B Natriuretic Peptide 974.2 (H) 0.0 - 100.0 pg/mL    Comment: Performed at [facility] [address], [place], [place] [address]  Troponin I (High Sensitivity)     Status: Abnormal   Collection Time: 09/07/21 12:25 PM  Result Value Ref Range   Troponin I (High Sensitivity) 30 (H) <18 ng/L    Comment: (NOTE) Elevated high sensitivity troponin I (hsTnI) values and significant  changes across serial measurements may suggest ACS but many other  chronic and acute conditions are known to elevate hsTnI results.  Refer to the "Links" section for chest pain algorithms and additional  guidance. Performed at [facility], [place] [address], [place], [place] [address]   D-dimer, quantitative     Status: Abnormal   Collection Time: 09/07/21  5:51 PM  Result Value Ref Range   D-Dimer, Quant 2.61 (H) 0.00 - 0.50 ug/mL-FEU    Comment: (NOTE) At the manufacturer cut-off value of 0.5 g/mL FEU, this assay has a negative predictive value of 95-100%.This assay is intended for use in conjunction with a clinical pretest probability (PTP) assessment model to exclude pulmonary embolism (PE) and deep venous thrombosis (DVT) in outpatients suspected of PE or DVT. Results should be correlated with clinical presentation. Performed at [facility], [place] [address], [place], [place] [address]   Magnesium     Status: None   Collection Time: 09/07/21  5:51 PM  Result Value Ref Range   Magnesium 2.0 1.7 - 2.4 mg/dL    Comment: Performed at [facility], [place] [address] [place], [place] [address]  Troponin I (High Sensitivity)     Status: Abnormal   Collection Time: 09/07/21  6:27 PM  Result Value Ref Range   Troponin I (High Sensitivity) 35 (H) <18 ng/L    Comment: (NOTE) Elevated high sensitivity troponin I (hsTnI) values and significant  changes across serial measurements may suggest ACS but many other  chronic and acute conditions are known to elevate hsTnI results.  Refer to the "Links" section for chest pain algorithms and additional  guidance. Performed at [facility], [place] [address] [place], [place] [address]    CT Chest Wo Contrast  Result Date: 09/07/2021 CLINICAL DATA:  Respiratory illness EXAM: CT CHEST WITHOUT CONTRAST TECHNIQUE: Multidetector CT imaging of the chest was performed following the standard protocol without IV contrast. RADIATION DOSE REDUCTION: This exam was performed according to the departmental dose-optimization program which includes automated exposure control, adjustment of the mA and/or kV according to patient size and/or use of iterative reconstruction technique. COMPARISON:  Chest CT dated February 04, 2021 FINDINGS: Cardiovascular: Mild cardiomegaly. No pericardial effusion. Prior transcatheter aortic valve replacement. Unchanged pericardial calcifications. Normal caliber thoracic aorta with moderate calcified plaque. Severe three-vessel coronary artery calcifications. Mediastinum/Nodes: Patulous esophagus. Thyroid is unremarkable. No pathologically enlarged lymph nodes seen in the chest. Lungs/Pleura: Central airways are patent. Septal thickening of the bilateral lower lungs. Small loculated right pleural effusion, increased in size when compared with prior exam. New trace left pleural effusion. No evidence of pleural thickening. Bibasilar atelectasis. No consolidation, pleural effusion or pneumothorax. Upper Abdomen: Stable left adrenal gland adenoma. No acute abnormality. Musculoskeletal: Loop recorder  device. IMPRESSION: 1. Small loculated right pleural effusion, increased in size when compared with prior CT exam. New trace left pleural effusion. 2. Mild pulmonary edema and bibasilar atelectasis. 3.  Aortic Atherosclerosis (ICD10-I70.0). Electronically Signed   By: Yetta Glassman [name]   On: 09/07/2021 17:11   DG Chest 1 View  Result Date: 09/07/2021 CLINICAL DATA:  Shortness of breath. EXAM: CHEST  1 VIEW COMPARISON:  September 06, 2021. FINDINGS: Stable cardiomegaly. Status post transcatheter aortic valve repair. Stable right-sided pleural effusion is noted with associated right basilar atelectasis or infiltrate. Bony thorax is unremarkable. IMPRESSION: Stable right-sided pleural effusion with associated right basilar atelectasis or infiltrate. Electronically Signed   By: Marijo Conception [name]   On: 09/07/2021 13:01   DG Chest 2 View  Result Date: 09/06/2021 CLINICAL DATA:  Heart failure, unspecified chronicity. Shortness of breath and edema. EXAM: CHEST - 2 VIEW COMPARISON:  Radiographs 12/26/2020 and 10/04/2020.  CT 02/04/2021. FINDINGS: Stable cardiomegaly and anterior pericardial calcification post TAVR procedure. A small right pleural effusion has enlarged compared with the most recent prior radiographs with extension into the fissures. There is no edema, confluent airspace opacity or pneumothorax. The bones appear unremarkable. Loop recorder is present in the anterior chest wall. IMPRESSION: Chronic cardiomegaly with interval increased right pleural effusion extending into the fissures. No edema. Electronically Signed   By: Richardean Sale [name]   On: 09/06/2021 13:52    Pending Labs Unresulted Labs (From admission, onward)     Start     Ordered   09/08/21 0500  Prealbumin  Tomorrow morning,   R        09/07/21 2222   09/07/21 2222  Hemoglobin A1c  Once,   R       Comments: To assess prior glycemic control    09/07/21 2221   09/07/21 2222  CK  Add-on,   AD  09/07/21 2222   09/07/21  2222  Phosphorus  Add-on,   AD        09/07/21 2222   09/07/21 2222  TSH  Add-on,   AD        09/07/21 2222   09/07/21 2222  Urinalysis, Complete w Microscopic  Once,   R       Question:  Release to patient  Answer:  Immediate   09/07/21 2222   09/07/21 2220  Blood gas, venous  ONCE - STAT,   STAT       Question:  Release to patient  Answer:  Immediate   09/07/21 2219   09/07/21 2220  Vitamin B12  (Anemia Panel (PNL))  Once,   R        09/07/21 2219   09/07/21 2220  Folate  (Anemia Panel (PNL))  Once,   R        09/07/21 2219   09/07/21 2220  Iron and TIBC  (Anemia Panel (PNL))  Once,   R        09/07/21 2219   09/07/21 2220  Ferritin  (Anemia Panel (PNL))  Once,   R        09/07/21 2219   09/07/21 2220  Reticulocytes  (Anemia Panel (PNL))  Once,   R        09/07/21 2219            Vitals/Pain Today's Vitals   09/07/21 1910 09/07/21 2000 09/07/21 2030 09/07/21 2100  BP: (!) 179/57 (!) 176/49 (!) 155/49 (!) 145/46  Pulse: 63 68 (!) 59 (!) 55  Resp: (!) 25 (!) 26 (!) 21 18  Temp:      TempSrc:      SpO2: 99% 99% 100% 98%  Weight:      Height:      PainSc:        Isolation Precautions Airborne and Contact precautions  Medications Medications  nitroGLYCERIN 50 mg in dextrose 5 % 250 mL (0.2 mg/mL) infusion (75 mcg/min Intravenous Rate/Dose Change 09/07/21 1915)  insulin aspart (novoLOG) injection 0-9 Units (has no administration in time range)  furosemide (LASIX) injection 80 mg (80 mg Intravenous Given 09/07/21 1841)  venlafaxine XR (EFFEXOR-XR) 24 hr capsule 150 mg (150 mg Oral Given 09/07/21 1839)  nitroGLYCERIN (NITROGLYN) 2 % ointment 1 inch (1 inch Topical Given 09/07/21 1842)    Mobility walks with person assist Low fall risk   Focused Assessments Pulmonary Assessment Handoff:  Lung sounds:   O2 Device: Bi-PAP      R Recommendations: See Admitting Provider Note  Report given to:   Additional Notes:

## 2021-09-07 NOTE — Assessment & Plan Note (Signed)
COVID infection -incidental finding patient is   So far mild URI symptoms CXR with no infiltrates Symptoms likely more secondary to CHF exacerbation Cycle Threshold >32 (indicates low levels of virus)          Obtain inflammatory markers Pronate as needed   Supportive measures Avoid over aggressive fluid resuscitation Airborne precautions

## 2021-09-07 NOTE — ED Provider Triage Note (Cosign Needed Addendum)
Emergency Medicine Provider Triage Evaluation Note  [name] , a 81 y.o. female  was evaluated in triage.  Pt complains of shortness of breath both with rest and exertion since this weekend.  She endorses a dry cough.  Sent by PCP office to rule out PE.  Admits to bilateral lower extremity edema with left being greater than right.  No history of blood clots. History of CHF.  Review of Systems  Positive: Edema, SOB, cough Negative: fever  Physical Exam  BP (!) 145/65 (BP Location: Right Arm)   Pulse (!) 58   Temp 98.4 F (36.9 C) (Oral)   Resp 18   Ht 5' 2.5" (1.588 m)   Wt 73.9 kg   SpO2 93%   BMI 29.34 kg/m  Gen:   Awake, no distress   Resp:  Normal effort  MSK:   Moves extremities without difficulty  Other:  Pitting edema bilaterally  Medical Decision Making  Medically screening exam initiated at 12:14 PM.  Appropriate orders placed.  Hilarie Sinha Slawson was informed that the remainder of the evaluation will be completed by another provider, this initial triage assessment does not replace that evaluation, and the importance of remaining in the ED until their evaluation is complete.  SOB, sent by PCP to rule out PE.  BNP to rule out CHF [name] to rule out DVT ADDENDUM, patient had [name] a few days ago which was negative for DVT, will not repeat at this time    Karie Kirks 09/07/21 1219    Suzy Bouchard, PA-C 09/07/21 1541

## 2021-09-07 NOTE — Assessment & Plan Note (Signed)
Status post TAVR 

## 2021-09-07 NOTE — Assessment & Plan Note (Signed)
In the setting of CKD. No chest pain EKG nonischemic.  Troponin flat. In the setting of demand ischemia and CHF

## 2021-09-07 NOTE — Assessment & Plan Note (Signed)
On Aspirin, repatha and labetalol  followed by cardiolgoy

## 2021-09-07 NOTE — Assessment & Plan Note (Addendum)
this patient has acute respiratory failure with Hypoxia and   as documented by the presence of following: O2 saturatio< 90% on RA  Likely due to:  CHF exacerbation,   Provide O2 therapy and titrate as needed  Continuous pulse ox   check Pulse ox with ambulation prior to discharge   may need  TC consult for home O2 set up   Prone if able  flutter valve ordered VQ has been ordered not a candidate for CTA

## 2021-09-08 ENCOUNTER — Inpatient Hospital Stay (HOSPITAL_COMMUNITY): Payer: Medicare PPO

## 2021-09-08 DIAGNOSIS — I25118 Atherosclerotic heart disease of native coronary artery with other forms of angina pectoris: Secondary | ICD-10-CM | POA: Diagnosis not present

## 2021-09-08 DIAGNOSIS — U071 COVID-19: Secondary | ICD-10-CM | POA: Diagnosis not present

## 2021-09-08 DIAGNOSIS — I5043 Acute on chronic combined systolic (congestive) and diastolic (congestive) heart failure: Secondary | ICD-10-CM | POA: Diagnosis not present

## 2021-09-08 DIAGNOSIS — N184 Chronic kidney disease, stage 4 (severe): Secondary | ICD-10-CM | POA: Diagnosis not present

## 2021-09-08 LAB — COMPREHENSIVE METABOLIC PANEL
ALT: 11 U/L (ref 0–44)
ALT: 11 U/L (ref 0–44)
ALT: 11 U/L (ref 0–44)
AST: 16 U/L (ref 15–41)
AST: 18 U/L (ref 15–41)
AST: 16 U/L (ref 15–41)
Albumin: 2.9 g/dL — ABNORMAL LOW (ref 3.5–5.0)
Albumin: 3.1 g/dL — ABNORMAL LOW (ref 3.5–5.0)
Albumin: 3 g/dL — ABNORMAL LOW (ref 3.5–5.0)
Alkaline Phosphatase: 68 U/L (ref 38–126)
Alkaline Phosphatase: 77 U/L (ref 38–126)
Alkaline Phosphatase: 78 U/L (ref 38–126)
Anion gap: 11 (ref 5–15)
Anion gap: 11 (ref 5–15)
Anion gap: 9 (ref 5–15)
BUN: 63 mg/dL — ABNORMAL HIGH (ref 8–23)
BUN: 64 mg/dL — ABNORMAL HIGH (ref 8–23)
BUN: 61 mg/dL — ABNORMAL HIGH (ref 8–23)
CO2: 21 mmol/L — ABNORMAL LOW (ref 22–32)
CO2: 23 mmol/L (ref 22–32)
CO2: 23 mmol/L (ref 22–32)
Calcium: 8.7 mg/dL — ABNORMAL LOW (ref 8.9–10.3)
Calcium: 9.2 mg/dL (ref 8.9–10.3)
Calcium: 9.1 mg/dL (ref 8.9–10.3)
Chloride: 108 mmol/L (ref 98–111)
Chloride: 107 mmol/L (ref 98–111)
Chloride: 109 mmol/L (ref 98–111)
Creatinine, Ser: 3.4 mg/dL — ABNORMAL HIGH (ref 0.44–1.00)
Creatinine, Ser: 3.46 mg/dL — ABNORMAL HIGH (ref 0.44–1.00)
Creatinine, Ser: 3.35 mg/dL — ABNORMAL HIGH (ref 0.44–1.00)
GFR, Estimated: 13 mL/min — ABNORMAL LOW (ref >60)
GFR, Estimated: 13 mL/min — ABNORMAL LOW (ref >60)
GFR, Estimated: 13 mL/min — ABNORMAL LOW (ref >60)
Glucose, Bld: 180 mg/dL — ABNORMAL HIGH (ref 70–99)
Glucose, Bld: 166 mg/dL — ABNORMAL HIGH (ref 70–99)
Glucose, Bld: 111 mg/dL — ABNORMAL HIGH (ref 70–99)
Potassium: 4 mmol/L (ref 3.5–5.1)
Potassium: 4.6 mmol/L (ref 3.5–5.1)
Potassium: 3.6 mmol/L (ref 3.5–5.1)
Sodium: 140 mmol/L (ref 135–145)
Sodium: 141 mmol/L (ref 135–145)
Sodium: 141 mmol/L (ref 135–145)
Total Bilirubin: 0.5 mg/dL (ref 0.3–1.2)
Total Bilirubin: 0.7 mg/dL (ref 0.3–1.2)
Total Bilirubin: 0.5 mg/dL (ref 0.3–1.2)
Total Protein: 5.8 g/dL — ABNORMAL LOW (ref 6.5–8.1)
Total Protein: 6.3 g/dL — ABNORMAL LOW (ref 6.5–8.1)
Total Protein: 6.2 g/dL — ABNORMAL LOW (ref 6.5–8.1)

## 2021-09-08 LAB — LACTATE DEHYDROGENASE: LDH: 226 U/L — ABNORMAL HIGH (ref 98–192)

## 2021-09-08 LAB — CBC WITH DIFFERENTIAL/PLATELET
Abs Immature Granulocytes: 0.03 10*3/uL (ref 0.00–0.07)
Basophils Absolute: 0 10*3/uL (ref 0.0–0.1)
Basophils Relative: 0 %
Eosinophils Absolute: 0.1 10*3/uL (ref 0.0–0.5)
Eosinophils Relative: 1 %
HCT: 32.9 % — ABNORMAL LOW (ref 36.0–46.0)
Hemoglobin: 10.9 g/dL — ABNORMAL LOW (ref 12.0–15.0)
Immature Granulocytes: 0 %
Lymphocytes Relative: 14 %
Lymphs Abs: 1.4 10*3/uL (ref 0.7–4.0)
MCH: 28.8 pg (ref 26.0–34.0)
MCHC: 33.1 g/dL (ref 30.0–36.0)
MCV: 86.8 fL (ref 80.0–100.0)
Monocytes Absolute: 0.8 10*3/uL (ref 0.1–1.0)
Monocytes Relative: 8 %
Neutro Abs: 7.4 10*3/uL (ref 1.7–7.7)
Neutrophils Relative %: 77 %
Platelets: 307 10*3/uL (ref 150–400)
RBC: 3.79 MIL/uL — ABNORMAL LOW (ref 3.87–5.11)
RDW: 15.5 % (ref 11.5–15.5)
WBC: 9.7 10*3/uL (ref 4.0–10.5)
nRBC: 0 % (ref 0.0–0.2)

## 2021-09-08 LAB — PHOSPHORUS
Phosphorus: 3.8 mg/dL (ref 2.5–4.6)
Phosphorus: 4.3 mg/dL (ref 2.5–4.6)
Phosphorus: 3.9 mg/dL (ref 2.5–4.6)

## 2021-09-08 LAB — CBC
HCT: 32.1 % — ABNORMAL LOW (ref 36.0–46.0)
Hemoglobin: 10.6 g/dL — ABNORMAL LOW (ref 12.0–15.0)
MCH: 28.7 pg (ref 26.0–34.0)
MCHC: 33 g/dL (ref 30.0–36.0)
MCV: 87 fL (ref 80.0–100.0)
Platelets: 306 10*3/uL (ref 150–400)
RBC: 3.69 MIL/uL — ABNORMAL LOW (ref 3.87–5.11)
RDW: 15.6 % — ABNORMAL HIGH (ref 11.5–15.5)
WBC: 9 10*3/uL (ref 4.0–10.5)
nRBC: 0 % (ref 0.0–0.2)

## 2021-09-08 LAB — RETICULOCYTES
Immature Retic Fract: 15.3 % (ref 2.3–15.9)
RBC.: 3.48 MIL/uL — ABNORMAL LOW (ref 3.87–5.11)
Retic Count, Absolute: 63.7 10*3/uL (ref 19.0–186.0)
Retic Ct Pct: 1.8 % (ref 0.4–3.1)

## 2021-09-08 LAB — MAGNESIUM
Magnesium: 1.9 mg/dL (ref 1.7–2.4)
Magnesium: 2 mg/dL (ref 1.7–2.4)

## 2021-09-08 LAB — GLUCOSE, CAPILLARY
Glucose-Capillary: 150 mg/dL — ABNORMAL HIGH (ref 70–99)
Glucose-Capillary: 114 mg/dL — ABNORMAL HIGH (ref 70–99)
Glucose-Capillary: 200 mg/dL — ABNORMAL HIGH (ref 70–99)
Glucose-Capillary: 119 mg/dL — ABNORMAL HIGH (ref 70–99)
Glucose-Capillary: 187 mg/dL — ABNORMAL HIGH (ref 70–99)

## 2021-09-08 LAB — PROCALCITONIN: Procalcitonin: <0.1 ng/mL

## 2021-09-08 LAB — C-REACTIVE PROTEIN
CRP: <0.5 mg/dL (ref <1.0)
CRP: <0.5 mg/dL (ref <1.0)

## 2021-09-08 LAB — HEMOGLOBIN A1C
Hgb A1c MFr Bld: 7.2 % — ABNORMAL HIGH (ref 4.8–5.6)
Mean Plasma Glucose: 159.94 mg/dL

## 2021-09-08 LAB — VITAMIN B12: Vitamin B-12: 481 pg/mL (ref 180–914)

## 2021-09-08 LAB — D-DIMER, QUANTITATIVE: D-Dimer, Quant: 1.71 ug/mL-FEU — ABNORMAL HIGH (ref 0.00–0.50)

## 2021-09-08 LAB — FOLATE: Folate: >40 ng/mL (ref >5.9)

## 2021-09-08 LAB — CK: Total CK: 122 U/L (ref 38–234)

## 2021-09-08 LAB — IRON AND TIBC
Iron: 39 ug/dL (ref 28–170)
Saturation Ratios: 16 % (ref 10.4–31.8)
TIBC: 249 ug/dL — ABNORMAL LOW (ref 250–450)
UIBC: 210 ug/dL

## 2021-09-08 LAB — FERRITIN
Ferritin: 59 ng/mL (ref 11–307)
Ferritin: 66 ng/mL (ref 11–307)

## 2021-09-08 LAB — TROPONIN I (HIGH SENSITIVITY)
Troponin I (High Sensitivity): 45 ng/L — ABNORMAL HIGH (ref <18)
Troponin I (High Sensitivity): 44 ng/L — ABNORMAL HIGH (ref <18)

## 2021-09-08 LAB — PREALBUMIN: Prealbumin: 21 mg/dL (ref 18–38)

## 2021-09-08 LAB — TSH: TSH: 2.625 u[IU]/mL (ref 0.350–4.500)

## 2021-09-08 MED ORDER — AMLODIPINE BESYLATE 10 MG PO TABS
10.0000 mg | ORAL_TABLET | Freq: Every day | ORAL | Status: DC
  Administered 2021-09-08 – 2021-09-14 (×7): 10 mg via ORAL
  Filled 2021-09-08 (×7): qty 1

## 2021-09-08 MED ORDER — FUROSEMIDE 10 MG/ML IJ SOLN
80.0000 mg | Freq: Two times a day (BID) | INTRAMUSCULAR | Status: DC
  Administered 2021-09-08: 80 mg via INTRAVENOUS
  Filled 2021-09-08: qty 8

## 2021-09-08 MED ORDER — VENLAFAXINE HCL ER 75 MG PO CP24: 150.0000 mg | ORAL_CAPSULE | Freq: Every day | ORAL | Status: DC

## 2021-09-08 MED ORDER — SODIUM CHLORIDE 0.9% FLUSH
3.0000 mL | Freq: Two times a day (BID) | INTRAVENOUS | Status: DC
  Administered 2021-09-08 – 2021-09-14 (×12): 3 mL via INTRAVENOUS

## 2021-09-08 MED ORDER — HYDROCODONE-ACETAMINOPHEN 5-325 MG PO TABS: 1.0000 | ORAL_TABLET | ORAL | Status: DC | PRN

## 2021-09-08 MED ORDER — ADULT MULTIVITAMIN W/MINERALS CH
1.0000 | ORAL_TABLET | Freq: Every day | ORAL | Status: DC
  Administered 2021-09-08 – 2021-09-14 (×7): 1 via ORAL
  Filled 2021-09-08 (×7): qty 1

## 2021-09-08 MED ORDER — ISOSORBIDE MONONITRATE ER 60 MG PO TB24
240.0000 mg | ORAL_TABLET | Freq: Every day | ORAL | Status: DC
  Administered 2021-09-08 – 2021-09-14 (×7): 240 mg via ORAL
  Filled 2021-09-08 (×7): qty 4

## 2021-09-08 MED ORDER — GLUCERNA SHAKE PO LIQD
237.0000 mL | Freq: Three times a day (TID) | ORAL | Status: DC
  Administered 2021-09-08 – 2021-09-13 (×11): 237 mL via ORAL

## 2021-09-08 MED ORDER — HYDRALAZINE HCL 20 MG/ML IJ SOLN
10.0000 mg | Freq: Once | INTRAMUSCULAR | Status: AC
  Administered 2021-09-08: 10 mg via INTRAVENOUS
  Filled 2021-09-08: qty 1

## 2021-09-08 MED ORDER — VENLAFAXINE HCL ER 75 MG PO CP24
150.0000 mg | ORAL_CAPSULE | Freq: Every day | ORAL | Status: DC
  Administered 2021-09-08: 150 mg via ORAL
  Filled 2021-09-08: qty 2

## 2021-09-08 MED ORDER — ACETAMINOPHEN 325 MG PO TABS
650.0000 mg | ORAL_TABLET | Freq: Four times a day (QID) | ORAL | Status: DC | PRN
  Administered 2021-09-08: 650 mg via ORAL
  Filled 2021-09-08: qty 2

## 2021-09-08 MED ORDER — SODIUM CHLORIDE 0.9 % IV SOLN: 250.0000 mL | INTRAVENOUS | Status: DC | PRN

## 2021-09-08 MED ORDER — SODIUM CHLORIDE 0.9% FLUSH: 3.0000 mL | INTRAVENOUS | Status: DC | PRN

## 2021-09-08 MED ORDER — HEPARIN SODIUM (PORCINE) 5000 UNIT/ML IJ SOLN
5000.0000 [IU] | Freq: Three times a day (TID) | INTRAMUSCULAR | Status: DC
  Administered 2021-09-08 – 2021-09-14 (×19): 5000 [IU] via SUBCUTANEOUS
  Filled 2021-09-08 (×19): qty 1

## 2021-09-08 MED ORDER — INSULIN GLARGINE-YFGN 100 UNIT/ML ~~LOC~~ SOLN
6.0000 [IU] | Freq: Every day | SUBCUTANEOUS | Status: DC
  Administered 2021-09-08 – 2021-09-13 (×6): 6 [IU] via SUBCUTANEOUS
  Filled 2021-09-08 (×8): qty 0.06

## 2021-09-08 MED ORDER — BRIMONIDINE TARTRATE 0.2 % OP SOLN
1.0000 [drp] | Freq: Every day | OPHTHALMIC | Status: DC
Start: 2021-09-08 — End: 2021-09-14
  Administered 2021-09-08 – 2021-09-14 (×7): 1 [drp] via OPHTHALMIC
  Filled 2021-09-08: qty 5

## 2021-09-08 MED ORDER — ACETAMINOPHEN 650 MG RE SUPP: 650.0000 mg | Freq: Four times a day (QID) | RECTAL | Status: DC | PRN

## 2021-09-08 MED ORDER — HYDRALAZINE HCL 50 MG PO TABS
50.0000 mg | ORAL_TABLET | Freq: Three times a day (TID) | ORAL | Status: DC
  Administered 2021-09-08 – 2021-09-14 (×19): 50 mg via ORAL
  Filled 2021-09-08 (×19): qty 1

## 2021-09-08 MED ORDER — FUROSEMIDE 10 MG/ML IJ SOLN
80.0000 mg | Freq: Three times a day (TID) | INTRAMUSCULAR | Status: DC
  Administered 2021-09-08 – 2021-09-10 (×6): 80 mg via INTRAVENOUS
  Filled 2021-09-08 (×6): qty 8

## 2021-09-08 MED ORDER — NIRMATRELVIR/RITONAVIR (PAXLOVID) TABLET (RENAL DOSING)
2.0000 | ORAL_TABLET | Freq: Two times a day (BID) | ORAL | Status: DC
  Administered 2021-09-08 – 2021-09-09 (×2): 2 via ORAL
  Filled 2021-09-08: qty 20

## 2021-09-08 MED ORDER — INSULIN ASPART 100 UNIT/ML IJ SOLN
0.0000 [IU] | Freq: Three times a day (TID) | INTRAMUSCULAR | Status: DC
  Administered 2021-09-08: 2 [IU] via SUBCUTANEOUS
  Administered 2021-09-09: 1 [IU] via SUBCUTANEOUS
  Administered 2021-09-09 – 2021-09-10 (×2): 5 [IU] via SUBCUTANEOUS
  Administered 2021-09-10: 2 [IU] via SUBCUTANEOUS
  Administered 2021-09-11: 3 [IU] via SUBCUTANEOUS
  Administered 2021-09-11: 1 [IU] via SUBCUTANEOUS
  Administered 2021-09-11: 3 [IU] via SUBCUTANEOUS
  Administered 2021-09-12: 2 [IU] via SUBCUTANEOUS
  Administered 2021-09-12: 3 [IU] via SUBCUTANEOUS
  Administered 2021-09-12: 2 [IU] via SUBCUTANEOUS
  Administered 2021-09-13: 1 [IU] via SUBCUTANEOUS
  Administered 2021-09-13 (×2): 2 [IU] via SUBCUTANEOUS

## 2021-09-08 MED ORDER — TECHNETIUM TO 99M ALBUMIN AGGREGATED
4.0000 | Freq: Once | INTRAVENOUS | Status: AC | PRN
Start: 2021-09-08 — End: 2021-09-08
  Administered 2021-09-08: 4 via INTRAVENOUS

## 2021-09-08 MED ORDER — LABETALOL HCL 200 MG PO TABS
200.0000 mg | ORAL_TABLET | Freq: Two times a day (BID) | ORAL | Status: DC
  Administered 2021-09-08 – 2021-09-14 (×13): 200 mg via ORAL
  Filled 2021-09-08 (×13): qty 1

## 2021-09-08 NOTE — Assessment & Plan Note (Addendum)
Continue blood pressure control labetalol, amlodipine. Isosorbide and hydralazine.

## 2021-09-08 NOTE — Assessment & Plan Note (Addendum)
Respiratory failure likely more due to pulmonary edema, Patient has high risk for complications due to comorbid medical conditions.   Patient has completed 5 doses of Paxlovid (renally dose adjusted).

## 2021-09-08 NOTE — Progress Notes (Signed)
Initial Nutrition Assessment  DOCUMENTATION CODES:   Not applicable  INTERVENTION:  Liberalize diet from a heart healthy/carb modified to a carb modified diet to provide wider variety of menu options to enhance nutritional adequacy Glucerna Shake po TID, each supplement provides 220 kcal and 10 grams of protein Nighttime snack to increase nutritional intake MVI with minerals daily  NUTRITION DIAGNOSIS:   Inadequate oral intake related to decreased appetite as evidenced by per patient/family report.  GOAL:   Patient will meet greater than or equal to 90% of their needs  MONITOR:   PO intake, Supplement acceptance, Diet advancement, Labs, Weight trends, I & O's  REASON FOR ASSESSMENT:   Consult Assessment of nutrition requirement/status  ASSESSMENT:   Pt admitted with SOB r/t acute on chronic CHF. Also found to be COVID+. PMH significant for CAD, HTN, DM, CKD stage V, CVA (2015/2020), severe AS s/p TAVR.  Spoke with RN in hallway prior to visit with patient. He states that once her diet was advanced she did not desire to eat but did accept a Glucerna shake. Her brother mentioned to the RN that he has difficulty convincing her to eat.   Pt sitting up in chair eating lunch at time of visit. She had received spaghetti and green beans and had only eaten about 20% of the spaghetti and stated that she was going to try to take a couple more bites however at end of assessment was being taken for testing.   She reports that her meal intake has decreased to about half of her typical intake within the last 2-3 weeks. She reports having general malaise as well as SOB and some fluid retention. She also recalls taste and smell changes which began about a year ago r/t COVID.   At home she also mentions that she manages her diabetes with diet and insulin. She does not generally use added salt with her meals at home.   Patient states that her weight had remained stable up until recently where  she felt as though he weight was increasing d/t fluid retention.  Reviewed weight history. Her weight appeared to have increased until December and has been noted to fluctuate between 73 and 76 kg. Uncertain how much wt loss is attributed to true dry weight loss versus fluid status.   Medications: lasix, SSI 0-9 units q4h, semglee 6 units qhs  Labs: BUN 64, Cr 3.46, GFR 13, HgbA1c 7.2%, CBG's 114/150/202 x24 hours  UOP: 1L x12 hours +62ml x12 hours I/O's: -1525ml since admission   NUTRITION - FOCUSED PHYSICAL EXAM:  Flowsheet Row Most Recent Value  Orbital Region No depletion  Upper Arm Region No depletion  Thoracic and Lumbar Region No depletion  Buccal Region No depletion  Temple Region No depletion  Clavicle Bone Region No depletion  Clavicle and Acromion Bone Region No depletion  Scapular Bone Region No depletion  Dorsal Hand No depletion  Patellar Region No depletion  Anterior Thigh Region No depletion  Posterior Calf Region No depletion  Edema (RD Assessment) None  Hair Reviewed  Eyes Reviewed  Mouth Reviewed  Skin Reviewed  Nails Reviewed       Diet Order:   Diet Order             Diet Carb Modified Fluid consistency: Thin; Room service appropriate? Yes  Diet effective now                   EDUCATION NEEDS:   Education needs have been addressed  Skin:  Skin Assessment: Reviewed RN Assessment  Last BM:  8/22  Height:   Ht Readings from Last 1 Encounters:  09/07/21 5' 2.5" (1.588 m)    Weight:   Wt Readings from Last 1 Encounters:  09/08/21 73.6 kg    Ideal Body Weight:     BMI:  Body mass index is 29.2 kg/m.  Estimated Nutritional Needs:   Kcal:  1600-1800  Protein:  75-90g  Fluid:  >/=1.6L   Clayborne Dana, RDN, LDN Clinical Nutrition

## 2021-09-08 NOTE — Assessment & Plan Note (Addendum)
Continue insulin sliding scale for glucose cover and monitoring. Basal insulin 6 units daily.  Her glucose remained stable during her hospitalization

## 2021-09-08 NOTE — Progress Notes (Signed)
RT took pt off of BIPAP and placed on 3 L [place]. PT is not having any respiratory distress at this time and is tolerating being off the BIPAP. Clear/ diminished BS bilaterally and stable vital stables are noted. RT will monitor.

## 2021-09-08 NOTE — TOC Progression Note (Signed)
Transition of Care [facility]) - Progression Note    Patient Details  Name: [name] MRN: [id number] Date of Birth: 1940-10-27  Transition of Care [facility]) CM/SW Contact  Zenon Mayo, RN Phone Number: 09/08/2021, 4:59 PM  Clinical Narrative:    NCM spoke with patient over the phone, presents with COVID from home alone, on 2 liters, EF of 60-65, conts on IV lasix, D Dimer 1.7, pt/ot will see patient.  TOC following.         Expected Discharge Plan and Services                                                 Social Determinants of Health (SDOH) Interventions    Readmission Risk Interventions     No data to display

## 2021-09-08 NOTE — Evaluation (Signed)
Occupational Therapy Evaluation Patient Details Name: [name] MRN: [id number] DOB: [birth year] Today's Date: 09/08/2021   History of Present Illness Pt is an 81 yo female admitted with SOB and feeling fatigued.  Pt tested +COVID on arrival. Pt with h/o CVA, CAD, CKD V, HTN, HLD, AVR, PVD.   Clinical Impression   Pt admitted with the above diagnosis and has the deficits listed below. Pt would benefit from cont OT to increase activity tolerance and independence with basic adls so she can return home with help from her brother. Pt currently had a cousin living with her as well but he is not in good health. Pt is very SOB and not ready to return home at this point, but feel pt will be safe when medically more stable if brother can assist PRN.        Recommendations for follow up therapy are one component of a multi-disciplinary discharge planning process, led by the attending physician.  Recommendations may be updated based on patient status, additional functional criteria and insurance authorization.   Follow Up Recommendations  Home health OT    Assistance Recommended at Discharge Frequent or constant Supervision/Assistance  Patient can return home with the following A little help with walking and/or transfers;A little help with bathing/dressing/bathroom;Help with stairs or ramp for entrance;Assist for transportation    Functional Status Assessment  Patient has had a recent decline in their functional status and demonstrates the ability to make significant improvements in function in a reasonable and predictable amount of time.  Equipment Recommendations  None recommended by OT    Recommendations for Other Services       Precautions / Restrictions Precautions Precautions: Fall Precaution Comments: Pt gets very SOB quickly Restrictions Weight Bearing Restrictions: No      Mobility Bed Mobility Overal bed mobility: Needs Assistance Bed Mobility: Supine to Sit     Supine  to sit: Min assist     General bed mobility comments: assist to get into full sitting. Cues to bring B feet both to floor.    Transfers Overall transfer level: Needs assistance Equipment used: Rolling walker (2 wheels) Transfers: Sit to/from Stand, Bed to chair/wheelchair/BSC Sit to Stand: Min guard Stand pivot transfers: Min assist         General transfer comment: pt limited by fatigue.  Cues for hand placement.      Balance Overall balance assessment: Needs assistance Sitting-balance support: Feet supported Sitting balance-Leahy Scale: Good     Standing balance support: During functional activity, Bilateral upper extremity supported Standing balance-Leahy Scale: Fair Standing balance comment: Pt could let go of walker for short amounts of time.  Felt better with walker due to fatigue                           ADL either performed or assessed with clinical judgement   ADL Overall ADL's : Needs assistance/impaired Eating/Feeding: Set up;Sitting   Grooming: Set up;Sitting Grooming Details (indicate cue type and reason): too fatigued to stand at sink Upper Body Bathing: Set up;Sitting   Lower Body Bathing: Minimal assistance;Sit to/from stand Lower Body Bathing Details (indicate cue type and reason): assist due to fatigue Upper Body Dressing : Minimal assistance;Sitting   Lower Body Dressing: Moderate assistance;Sit to/from stand Lower Body Dressing Details (indicate cue type and reason): assist needed due to quickly fatiguing Toilet Transfer: Min guard;Stand-pivot;BSC/3in1   Toileting- Clothing Manipulation and Hygiene: Minimal assistance;Sit to/from stand  Functional mobility during ADLs: Minimal assistance;Rolling walker (2 wheels) General ADL Comments: Pt most limited by SOB and fatigue. Pt donned and doffed socks requiring significant rest breaks but no physical assist.     Vision Baseline Vision/History: 0 No visual deficits Ability to See  in Adequate Light: 0 Adequate Patient Visual Report: No change from baseline Vision Assessment?: No apparent visual deficits     Perception     Praxis      Pertinent Vitals/Pain Pain Assessment Pain Assessment: No/denies pain     Hand Dominance Right   Extremity/Trunk Assessment Upper Extremity Assessment Upper Extremity Assessment: Overall WFL for tasks assessed   Lower Extremity Assessment Lower Extremity Assessment: Defer to PT evaluation   Cervical / Trunk Assessment Cervical / Trunk Assessment: Normal   Communication Communication Communication: No difficulties   Cognition Arousal/Alertness: Awake/alert Behavior During Therapy: WFL for tasks assessed/performed Overall Cognitive Status: Within Functional Limits for tasks assessed                                 General Comments: very fatigued     General Comments  Pt most limited by fatigue and SOB    Exercises     Shoulder Instructions      Home Living Family/patient expects to be discharged to:: Private residence Living Arrangements: Alone Available Help at Discharge: Available PRN/intermittently Type of Home: House Home Access: Stairs to enter Technical brewer of Steps: 3   Home Layout: Multi-level Alternate Level Stairs-Number of Steps: 12 Alternate Level Stairs-Rails: Right Bathroom Shower/Tub: Teacher, early years/pre: Standard     Home Equipment: Conservation officer, nature (2 wheels);Shower seat   Additional Comments: Pt uses walker only outside of home      Prior Functioning/Environment Prior Level of Function : Independent/Modified Independent             Mobility Comments: uses walker outside of home only ADLs Comments: mod I        OT Problem List: Decreased activity tolerance;Impaired balance (sitting and/or standing);Decreased knowledge of use of DME or AE      OT Treatment/Interventions: Self-care/ADL training;Therapeutic activities    OT  Goals(Current goals can be found in the care plan section) Acute Rehab OT Goals Patient Stated Goal: to get stronger to be able to go home OT Goal Formulation: With patient Time For Goal Achievement: 09/22/21 Potential to Achieve Goals: Good ADL Goals Additional ADL Goal #1: Pt will gather clothing with walker and dress self with 2 rest breaks and no physical assist. Additional ADL Goal #2: Pt wil walk to bathroom and complete all toileting and wash hands with distant supervision. Additional ADL Goal #3: pt will state two things she can do at home to conserve energy throughout the day without cues.  OT Frequency: Min 2X/week    Co-evaluation              AM-PAC OT "6 Clicks" Daily Activity     Outcome Measure Help from another person eating meals?: None Help from another person taking care of personal grooming?: None Help from another person toileting, which includes using toliet, bedpan, or urinal?: A Little Help from another person bathing (including washing, rinsing, drying)?: A Little Help from another person to put on and taking off regular upper body clothing?: A Little Help from another person to put on and taking off regular lower body clothing?: A Little 6 Click Score: 20  End of Session Equipment Utilized During Treatment: Rolling walker (2 wheels);Oxygen Nurse Communication: Mobility status  Activity Tolerance: Patient limited by fatigue Patient left: in chair;with call bell/phone within reach  OT Visit Diagnosis: Unsteadiness on feet (R26.81)                Time: 6599-3570 OT Time Calculation (min): 27 min Charges:  OT General Charges $OT Visit: 1 Visit OT Evaluation $OT Eval Moderate Complexity: 1 Mod OT Treatments $Self Care/Home Management : 8-22 mins  Glenford Peers 09/08/2021, 11:36 AM

## 2021-09-08 NOTE — Assessment & Plan Note (Addendum)
04/2021 echocardiogram with preserved LV systolic function EF 60 to 65%.  RV systolic function is preserved. SP aortic valve replacement with no stenosis.  Urine output documented 583 cc  Systolic blood pressure 462 to 140 mmHg.   Continue with hydralazine and isosorbide. Labetalol and amlodipine for blood pressure control.  Hold on diuretic therapy for now.   Acute hypoxemic respiratory failure due to acute cardiogenic pulmonary edema.  Oxymetry is 97% on room air.

## 2021-09-08 NOTE — Hospital Course (Addendum)
[name] as admitted to the hospital with the working diagnosis of decompensated heart failure.   81 yo female with the past medical history of hypertension, coronary artery disease, CKD and T2DM ho presented with dyspnea. Reported progressive dyspnea and lower extremity edema. Recent upper respiratory tract infection. On her initial physical examination her blood pressure was 145/65, HR 58, RR 18 and 02 saturation 93%, patient was placed on Bipap due to respiratory distress. Lungs with no wheezing or rhonchi, heart with S1 and S2 present and rhythmic, abdomen not distended and trace lower extremity edema.   Na 140, K 4,0 Cl 108, bicarbonate 21, glucose 180, bun 63 cr 3,4  High sensitive troponin 35 and 44  Wbc 9,7 hgb 10,9 and plt 307  SARS covid 19 positive   Chest radiograph with cardiomegaly, cephalization of the vasculature and bilateral hilar vascular congestion, right pleural effusion, small to moderate.   EKG 58 bpm, normal axis, normal intervals, sinus rhythm with no significant ST segment or T wave changes.   Patient was placed on IV furosemide for diuresis Paxlovid for COIVD 19 infection.   Patient has responded to to diuresis with furosemide, improvement in volume status and oxygenation. Plan to discharge in 24 hrs pending improvement in her renal function.

## 2021-09-08 NOTE — Progress Notes (Signed)
Progress Note   Patient: [name] VZD:[id number] DOB: Jun 30, 1940 DOA: 09/07/2021     1 DOS: the patient was seen and examined on 09/08/2021   Brief hospital course: Mrs. Piscopo as admitted to the hospital with the working diagnosis of decompensated heart failure.   81 yo female with the past medical history of hypertension, coronary artery disease, CKD and T2DM ho presented with dyspnea. Reported progressive dyspnea and lower extremity edema. Recent upper respiratory tract infection. On her initial physical examination her blood pressure was 145/65, HR 58, RR 18 and 02 saturation 93%, patient was placed on Bipap due to respiratory distress. Lungs with no wheezing or rhonchi, heart with S1 and S2 present and rhythmic, abdomen not distended and trace lower extremity edema.   Na 140, K 4,0 Cl 108, bicarbonate 21, glucose 180, bun 63 cr 3,4  High sensitive troponin 35 and 44  Wbc 9,7 hgb 10,9 and plt 307  SARS covid 19 positive   Chest radiograph with cardiomegaly, cephalization of the vasculature and bilateral hilar vascular congestion, right pleural effusion, small to moderate.   EKG 58 bpm, normal axis, normal intervals, sinus rhythm with no significant ST segment or T wave changes.   Assessment and Plan: * Acute on chronic combined systolic and diastolic CHF (congestive heart failure) ([place]) 04/2021 echocardiogram with preserved LV systolic function EF 60 to 65%.  RV systolic function is preserved. SP aortic valve replacement with no stenosis.  Urine output 2951 ml' Systolic blood pressure is 180, 130 to 150 mmHg,  Plan to continue diuresis with furosemide, will increase to 80 mg TID Continue afterload reduction with hydralazine and isosorbide. Continue labetalol and amlodipine for blood pressure control.   Acute hypoxemic respiratory failure due to acute cardiogenic pulmonary edema.   COVID-19 virus infection Respiratory failure likely more due to pulmonary edema, Patient has  high risk for complications due to comorbid medical conditions.   Plan to start patient on Paxlovid Hold on steroids, continue oxymetry monitoring.   CKD (chronic kidney disease), stage IV ([place]) Renal function with serum cr at 3,35 with K at 3,6 and serum bicarbonate at 23.  Plan to continue diuresis with furosemide.  Follow up renal function in am, avoid hypotension or nephrotoxic medications.   Coronary artery disease of native artery of native heart with stable angina pectoris (HCC) No chest pain Elevated troponin due to heart failure exacerbation.   DM (diabetes mellitus), type 2 with renal complications (HCC) Continue insulin sliding scale for glucose cover and monitoring Fasting glucose is 111 mg/dl    Essential hypertension Continue blood pressure control with amlodipine and labetalol. Isosorbide and hydralazine.         Subjective: Patient with no chest pain, dyspnea and lower extremity edema is improving but not back to baseline  Physical Exam: Vitals:   09/08/21 0840 09/08/21 1147 09/08/21 1436 09/08/21 1630  BP:  (!) 138/46  (!) 153/52  Pulse: 65 (!) 57 (!) 58 67  Resp: 20 20 18 20   Temp:  97.9 F (36.6 C)  97.7 F (36.5 C)  TempSrc:  Oral  Oral  SpO2: 98% 99% 100% 97%  Weight:      Height:       Neurology awake and alert ENT with mild pallor Cardiovascular with S1 and S2 present and rhythmic Positive JVD Respiratory with no wheezing Abdomen not distended Positive lower extremity edema  Data Reviewed:    Family Communication: no family at the bedside   Disposition: Status is:  Inpatient Remains inpatient appropriate because: respiratory and heart failure   Planned Discharge Destination: Home    Author: Tawni Millers, MD 09/08/2021 5:14 PM  For on call review [email or web address].

## 2021-09-08 NOTE — ED Notes (Signed)
Pt called out because bed was wet.  Purewick had leaked and she had her underwear holding it in place.  I assisted her in changing from underwear to brief, changed linens and placed purewick.

## 2021-09-08 NOTE — Progress Notes (Signed)
Attempted to give pt a break off Bipap. Pt stated is still hard to breath. Pt palced back on BiPAP at this time.

## 2021-09-08 NOTE — Evaluation (Signed)
Physical Therapy Evaluation Patient Details Name: [name] MRN: [id number] DOB: Aug 14, 1940 Today's Date: 09/08/2021  History of Present Illness  The pt is an 81 yo female presenting 8/22 with SOB. Found to be COVID + and with R-sided pleural effusion. Pt with h/o CVA, CAD, CKD V, HTN, HLD, AVR, PVD.   Clinical Impression  Pt in bed upon arrival of PT, agreeable to evaluation at this time. Prior to admission the pt was independent with mobility in the home, reports she was active with HHPT prior to admission, but before that was participating in water aerobics and walking in the house for exercise. The pt now presents with limitations in functional mobility, dynamic stability, and endurance due to above dx, and will continue to benefit from skilled PT to address these deficits. The pt was able to complete ~15 ft ambulation with intermittent use of HHA but VSS on RA with all activity. Will continue to follow to maintain mobility, but the pt will be able to return home when medically stable.      Recommendations for follow up therapy are one component of a multi-disciplinary discharge planning process, led by the attending physician.  Recommendations may be updated based on patient status, additional functional criteria and insurance authorization.  Follow Up Recommendations Other (comment) (pt states she has 2 more weeks HHPT and plans to finish that)      Assistance Recommended at Discharge PRN  Patient can return home with the following  Assistance with cooking/housework;Direct supervision/assist for medications management;Direct supervision/assist for financial management;Assist for transportation;Help with stairs or ramp for entrance    Equipment Recommendations None recommended by PT  Recommendations for Other Services       Functional Status Assessment Patient has had a recent decline in their functional status and demonstrates the ability to make significant improvements in  function in a reasonable and predictable amount of time.     Precautions / Restrictions Precautions Precautions: Fall Precaution Comments: Pt gets very SOB quickly, SpO2 stable on RA Restrictions Weight Bearing Restrictions: No      Mobility  Bed Mobility Overal bed mobility: Needs Assistance Bed Mobility: Supine to Sit     Supine to sit: Min assist     General bed mobility comments: assist to get into full sitting. Cues to bring B feet both to floor.    Transfers Overall transfer level: Needs assistance Equipment used: Rolling walker (2 wheels) Transfers: Sit to/from Stand Sit to Stand: Min guard           General transfer comment: pt standing from transport chair without issue, offered HHA for balance    Ambulation/Gait Ambulation/Gait assistance: Min guard, Min assist Gait Distance (Feet): 15 Feet Assistive device: 1 person hand held assist, None Gait Pattern/deviations: Step-through pattern, Decreased stride length Gait velocity: decreased     General Gait Details: pt with slow gait and at times high-guard stance while walking in room. VSS on RA with activity.    Balance Overall balance assessment: Needs assistance Sitting-balance support: Feet supported Sitting balance-Leahy Scale: Good     Standing balance support: During functional activity, Bilateral upper extremity supported Standing balance-Leahy Scale: Fair Standing balance comment: pt intermittently needing minA to steady or single UE support. no overt LOB                             Pertinent Vitals/Pain Pain Assessment Pain Assessment: No/denies pain    Home Living Family/patient  expects to be discharged to:: Private residence Living Arrangements: Alone Available Help at Discharge: Available PRN/intermittently Type of Home: House Home Access: Stairs to enter   Entrance Stairs-Number of Steps: 3 Alternate Level Stairs-Number of Steps: 12 Home Layout: [place]: Conservation officer, nature (2 wheels);Shower seat Additional Comments: Pt uses walker only outside of home    Prior Function Prior Level of Function : Independent/Modified Independent             Mobility Comments: uses walker outside of home only. reports she was completing HHPT prior to admission and had previously been doing water aerobics and walking in the home for exercise ADLs Comments: mod I     Hand Dominance   Dominant Hand: Right    Extremity/Trunk Assessment   Upper Extremity Assessment Upper Extremity Assessment: Overall WFL for tasks assessed    Lower Extremity Assessment Lower Extremity Assessment: Overall WFL for tasks assessed    Cervical / Trunk Assessment Cervical / Trunk Assessment: Normal  Communication   Communication: No difficulties  Cognition Arousal/Alertness: Awake/alert Behavior During Therapy: WFL for tasks assessed/performed Overall Cognitive Status: Within Functional Limits for tasks assessed                                 General Comments: very fatigued        General Comments General comments (skin integrity, edema, etc.): most limited by fatigue. SpO2 stable on RA (97-99%)        Assessment/Plan    PT Assessment Patient needs continued PT services  PT Problem List Cardiopulmonary status limiting activity;Decreased activity tolerance;Decreased balance       PT Treatment Interventions DME instruction;Gait training;Stair training;Functional mobility training;Therapeutic activities;Therapeutic exercise;Balance training;Patient/family education    PT Goals (Current goals can be found in the Care Plan section)  Acute Rehab PT Goals Patient Stated Goal: return home and to water aerobics PT Goal Formulation: With patient Time For Goal Achievement: 09/22/21 Potential to Achieve Goals: Good    Frequency Min 3X/week        AM-PAC PT "6 Clicks" Mobility  Outcome Measure Help needed turning from your back to your  side while in a flat bed without using bedrails?: A Little Help needed moving from lying on your back to sitting on the side of a flat bed without using bedrails?: A Little Help needed moving to and from a bed to a chair (including a wheelchair)?: A Little Help needed standing up from a chair using your arms (e.g., wheelchair or bedside chair)?: A Little Help needed to walk in hospital room?: A Lot (unable to walk 20 ft at this time) Help needed climbing 3-5 steps with a railing? : A Little 6 Click Score: 17    End of Session Equipment Utilized During Treatment: Gait belt Activity Tolerance: Patient tolerated treatment well;Patient limited by fatigue Patient left: in bed;with call bell/phone within reach Nurse Communication: Mobility status PT Visit Diagnosis: Other abnormalities of gait and mobility (R26.89);Muscle weakness (generalized) (M62.81)    Time: 0867-6195 PT Time Calculation (min) (ACUTE ONLY): 18 min   Charges:   PT Evaluation $PT Eval Low Complexity: 1 Low          [place] Car[place], PT, DPT   Acute Rehabilitation Department  Sandra Cockayne 09/08/2021, 2:10 PM

## 2021-09-08 NOTE — Assessment & Plan Note (Addendum)
Patient is euvolemic on her physical examination, her renal function today has a serum cr of 3,70 with K at 3,8 and serum bicarbonate at 23.  Plan to continue blood pressure control and hold on diuretic therapy for now. Follow up renal function in am, if improvement in serum cr will resume oral diuretic therapy.

## 2021-09-08 NOTE — Progress Notes (Signed)
Heart Failure Navigator Progress Note  Assessed for Heart & Vascular TOC clinic readiness.  Patient does not meet criteria due to [facility] patient..     [name], BSN, RN Heart Failure Nurse Navigator Secure Chat Only   

## 2021-09-08 NOTE — Assessment & Plan Note (Signed)
No chest pain Elevated troponin due to heart failure exacerbation.

## 2021-09-09 DIAGNOSIS — I5043 Acute on chronic combined systolic (congestive) and diastolic (congestive) heart failure: Secondary | ICD-10-CM | POA: Diagnosis not present

## 2021-09-09 DIAGNOSIS — N184 Chronic kidney disease, stage 4 (severe): Secondary | ICD-10-CM | POA: Diagnosis not present

## 2021-09-09 DIAGNOSIS — U071 COVID-19: Secondary | ICD-10-CM | POA: Diagnosis not present

## 2021-09-09 DIAGNOSIS — I25118 Atherosclerotic heart disease of native coronary artery with other forms of angina pectoris: Secondary | ICD-10-CM | POA: Diagnosis not present

## 2021-09-09 LAB — BLOOD GAS, VENOUS
Acid-Base Excess: 1.9 mmol/L (ref 0.0–2.0)
Bicarbonate: 26.6 mmol/L (ref 20.0–28.0)
Drawn by: 164
O2 Saturation: 27.1 %
Patient temperature: 36.5
pCO2, Ven: 40 mmHg — ABNORMAL LOW (ref 44–60)
pH, Ven: 7.43 (ref 7.25–7.43)
pO2, Ven: <31 mmHg — CL (ref 32–45)

## 2021-09-09 LAB — GLUCOSE, CAPILLARY
Glucose-Capillary: 105 mg/dL — ABNORMAL HIGH (ref 70–99)
Glucose-Capillary: 136 mg/dL — ABNORMAL HIGH (ref 70–99)
Glucose-Capillary: 269 mg/dL — ABNORMAL HIGH (ref 70–99)
Glucose-Capillary: 84 mg/dL (ref 70–99)

## 2021-09-09 LAB — BASIC METABOLIC PANEL
Anion gap: 9 (ref 5–15)
BUN: 58 mg/dL — ABNORMAL HIGH (ref 8–23)
CO2: 26 mmol/L (ref 22–32)
Calcium: 9.2 mg/dL (ref 8.9–10.3)
Chloride: 106 mmol/L (ref 98–111)
Creatinine, Ser: 3.42 mg/dL — ABNORMAL HIGH (ref 0.44–1.00)
GFR, Estimated: 13 mL/min — ABNORMAL LOW (ref >60)
Glucose, Bld: 100 mg/dL — ABNORMAL HIGH (ref 70–99)
Potassium: 4.7 mmol/L (ref 3.5–5.1)
Sodium: 141 mmol/L (ref 135–145)

## 2021-09-09 MED ORDER — NIRMATRELVIR/RITONAVIR (PAXLOVID) TABLET (RENAL DOSING)
2.0000 | ORAL_TABLET | Freq: Every day | ORAL | Status: AC
  Administered 2021-09-10 – 2021-09-12 (×3): 2 via ORAL
  Filled 2021-09-09: qty 20

## 2021-09-09 MED ORDER — VENLAFAXINE HCL ER 75 MG PO CP24
150.0000 mg | ORAL_CAPSULE | Freq: Every day | ORAL | Status: DC
  Administered 2021-09-09 – 2021-09-14 (×6): 150 mg via ORAL
  Filled 2021-09-09 (×6): qty 2

## 2021-09-09 MED ORDER — CALCIUM CARBONATE ANTACID 500 MG PO CHEW
400.0000 mg | CHEWABLE_TABLET | Freq: Three times a day (TID) | ORAL | Status: DC | PRN
  Administered 2021-09-09 – 2021-09-12 (×2): 400 mg via ORAL
  Filled 2021-09-09 (×2): qty 2

## 2021-09-09 NOTE — Progress Notes (Signed)
Physical Therapy Treatment Patient Details Name: [name] MRN: [id number] DOB: 1940/12/30 Today's Date: 09/09/2021   History of Present Illness The pt is an 81 yo female presenting 8/22 with SOB. Pt with CHF exacerbation and found to be COVID + and with R-sided pleural effusion. Pt with h/o CVA, CAD, CKD V, HTN, HLD, AVR, PVD.    PT Comments    Pt with much improved activity tolerance and able to amb 100' with RW with minimal dyspnea. Pt plans to go to brother's home at [place] so she doesn't have to climb stairs to her bedroom. She is active with HHPT and should continue with that.    Recommendations for follow up therapy are one component of a multi-disciplinary discharge planning process, led by the attending physician.  Recommendations may be updated based on patient status, additional functional criteria and insurance authorization.  Follow Up Recommendations  Home health PT (pt states she has 2 more weeks HHPT and plans to finish that)     Assistance Recommended at Discharge Intermittent Supervision/Assistance  Patient can return home with the following Assistance with cooking/housework;Direct supervision/assist for medications management;Direct supervision/assist for financial management;Assist for transportation;Help with stairs or ramp for entrance   Equipment Recommendations  None recommended by PT    Recommendations for Other Services       Precautions / Restrictions Precautions Precautions: Fall Restrictions Weight Bearing Restrictions: No     Mobility  Bed Mobility Overal bed mobility: Needs Assistance Bed Mobility: Sit to Supine       Sit to supine: Min assist   General bed mobility comments: Assist to bring legs back up into bed    Transfers Overall transfer level: Needs assistance Equipment used: Rolling walker (2 wheels) Transfers: Sit to/from Stand Sit to Stand: Min guard           General transfer comment: Verbal cues for hand placement     Ambulation/Gait Ambulation/Gait assistance: Min guard Gait Distance (Feet): 100 Feet Assistive device: Rolling walker (2 wheels) Gait Pattern/deviations: Step-through pattern, Decreased stride length Gait velocity: decreased Gait velocity interpretation: <1.31 ft/sec, indicative of household ambulator   General Gait Details: Assist for safety.   Stairs             Wheelchair Mobility    Modified Rankin (Stroke Patients Only)       Balance Overall balance assessment: Needs assistance Sitting-balance support: Feet supported Sitting balance-Leahy Scale: Good     Standing balance support: During functional activity, Bilateral upper extremity supported, No upper extremity supported Standing balance-Leahy Scale: Fair                              Cognition Arousal/Alertness: Awake/alert Behavior During Therapy: WFL for tasks assessed/performed Overall Cognitive Status: Within Functional Limits for tasks assessed                                          Exercises      General Comments General comments (skin integrity, edema, etc.): VSS on RA.      Pertinent Vitals/Pain Pain Assessment Pain Assessment: No/denies pain    Home Living                          Prior Function  PT Goals (current goals can now be found in the care plan section) Acute Rehab PT Goals Patient Stated Goal: return home and to water aerobics Progress towards PT goals: Progressing toward goals    Frequency    Min 3X/week      PT Plan Current plan remains appropriate    Co-evaluation              AM-PAC PT "6 Clicks" Mobility   Outcome Measure  Help needed turning from your back to your side while in a flat bed without using bedrails?: A Little Help needed moving from lying on your back to sitting on the side of a flat bed without using bedrails?: A Little Help needed moving to and from a bed to a chair (including a  wheelchair)?: A Little Help needed standing up from a chair using your arms (e.g., wheelchair or bedside chair)?: A Little Help needed to walk in hospital room?: A Little Help needed climbing 3-5 steps with a railing? : A Little 6 Click Score: 18    End of Session   Activity Tolerance: Patient tolerated treatment well Patient left: in bed;with call bell/phone within reach;with bed alarm set Nurse Communication: Mobility status PT Visit Diagnosis: Other abnormalities of gait and mobility (R26.89);Muscle weakness (generalized) (M62.81)     Time: 0981-1914 PT Time Calculation (min) (ACUTE ONLY): 22 min  Charges:  $Gait Training: 8-22 mins                     [place] Office [place] 09/09/2021, 3:40 PM

## 2021-09-09 NOTE — TOC Initial Note (Signed)
Transition of Care [facility]) - Initial/Assessment Note    Patient Details  Name: [name] MRN: [id number] Date of Birth: 04-Apr-1940  Transition of [facility]) CM/SW Contact:    Zenon Mayo, RN Phone Number: 09/09/2021, 10:27 AM  Clinical Narrative:                 She is active with [facility] for [facility], [place], [place], she states she would like to continue with [facility] for these services.  NCM confirmed with Anderson Malta with [facility].  She has PCP.  She uses a walker at home.  TOC following.   Expected Discharge Plan: [place] Barriers to Discharge: Continued Medical Work up   Patient Goals and CMS Choice Patient states their goals for this hospitalization and ongoing recovery are:: return home CMS Medicare.gov Compare Post Acute Care list provided to:: Patient Choice offered to / list presented to : Patient  Expected Discharge Plan and Services Expected Discharge Plan: [place] In-house Referral: NA Discharge Planning Services: CM Consult Post Acute Care Choice: Home Health, Resumption of Svcs/PTA Provider Living arrangements for the past 2 months: Single Family Home                   DME Agency: NA       HH Arranged: RN, PT, OT HH Agency: [facility] Date [place] Agency Contacted: 09/09/21 Time HH Agency Contacted: 42 Representative spoke with at [facility]: Anderson Malta  Prior Living Arrangements/Services Living arrangements for the past 2 months: [place] with:: Self Patient language and need for interpreter reviewed:: Yes Do you feel safe going back to the place where you live?: Yes      Need for Family Participation in Patient Care: No (Comment) Care giver support system in place?: Yes (comment) Current home services: DME, Home OT, Home PT, Home RN (has a walker, active with Company secretary for RN, PT, OT) Criminal Activity/Legal Involvement Pertinent to Current Situation/Hospitalization: No - Comment as  needed  Activities of Daily Living      Permission Sought/Granted                  Emotional Assessment Appearance:: Appears stated age Attitude/Demeanor/Rapport: Engaged Affect (typically observed): Appropriate Orientation: : Oriented to Self, Oriented to Place, Oriented to  Time, Oriented to Situation Alcohol / Substance Use: Not Applicable Psych Involvement: No (comment)  Admission diagnosis:  Acute pulmonary edema (HCC) [J81.0] Acute respiratory failure with hypoxia (HCC) [J96.01] Acute on chronic combined systolic and diastolic CHF (congestive heart failure) (HCC) [I50.43] Patient Active Problem List   Diagnosis Date Noted   COVID-19 virus infection 09/07/2021   DM (diabetes mellitus), type 2 with renal complications ([place]) 14/97/0263   History of vitrectomy 05/04/2021   Pseudophakia, right eye 05/04/2021   Right posterior capsular opacification 05/04/2021   Intolerance of continuous positive airway pressure (CPAP) ventilation 03/25/2021   Non compliance with medical treatment 03/25/2021   Difficulty with adaptive servo-ventilation (ASV) use 03/25/2021   Treatment-emergent central sleep apnea 08/27/2020   Complex sleep apnea syndrome 08/27/2020   Need for prophylactic antibiotic 07/09/2020   CKD (chronic kidney disease), stage V ([place]) 07/09/2020   Pneumonia 06/25/2020   PVD (peripheral vascular disease) with claudication ([place]) 06/04/2020   Cardiovascular renal disease, stage 1-4 or unspecified chronic kidney disease, without heart failure 05/13/2020   SOB (shortness of breath) 05/12/2020   Cerebrovascular accident (CVA) due to embolism of right posterior cerebral artery ([place]) 05/11/2020  CHF (congestive heart failure) ([place]) 05/11/2020   Stable treated proliferative diabetic retinopathy of right eye without macular edema determined by examination associated with type 2 diabetes mellitus ([place]) 03/02/2020   Posterior vitreous detachment of right eye 03/02/2020    Anemia 02/28/2020   Acute on chronic renal failure ([place]) 02/28/2020   Hypokalemia    Diabetic peripheral neuropathy (HCC)    Benign essential HTN    Cerebral infarction due to embolism of right anterior cerebral artery ([place]) 02/19/2020   Ischemic stroke of frontal lobe ([place]) 02/17/2020   Cerebral embolism with cerebral infarction 02/12/2020   Thrombocytopenia ([place]) 02/10/2020   Acute left-sided weakness 02/09/2020   Weakness 02/08/2020   Chronic heart failure with preserved ejection fraction ([place]) 01/02/2020   Atypical chest pain 05/31/2019   Central retinal vein occlusion, left eye, stable 05/29/2019   Central retinal vein occlusion of left eye 05/28/2019   Proliferative diabetic retinopathy of left eye with macular edema associated with type 2 diabetes mellitus ([place]) 05/28/2019   Acute on chronic combined systolic and diastolic CHF (congestive heart failure) ([place]) 11/13/2018   S/P TAVR (transcatheter aortic valve replacement) 11/13/2018   Essential hypertension    Hyperlipidemia    Renal lesion    CKD (chronic kidney disease), stage IV ([place]) 12/05/2017   Coronary artery disease of native artery of native heart with stable angina pectoris ([place]) 01/07/2014   S/P PTCA (percutaneous transluminal coronary angioplasty) 01/07/2014   History of ischemic anterior cerebral artery stroke 01/30/2013   PCP:  Audley Hose, MD Pharmacy:   [id number] [place], [place] [address] [place] [address] Phone: [phone] Fax: [phone]     Social Determinants of Health (SDOH) Interventions    Readmission Risk Interventions    09/09/2021   10:21 AM  Readmission Risk Prevention Plan  Transportation Screening Complete  PCP or Specialist Appt within 5-7 Days Complete  Home Care Screening Complete  Medication Review (RN CM) Complete

## 2021-09-09 NOTE — Progress Notes (Signed)
Progress Note   Patient: [name]:[id number] DOB: 06/17/1940 DOA: 09/07/2021     2 DOS: the patient was seen and examined on 09/09/2021   Brief hospital course: [name] as admitted to the hospital with the working diagnosis of decompensated heart failure.   81 yo female with the past medical history of hypertension, coronary artery disease, CKD and T2DM ho presented with dyspnea. Reported progressive dyspnea and lower extremity edema. Recent upper respiratory tract infection. On her initial physical examination her blood pressure was 145/65, HR 58, RR 18 and 02 saturation 93%, patient was placed on Bipap due to respiratory distress. Lungs with no wheezing or rhonchi, heart with S1 and S2 present and rhythmic, abdomen not distended and trace lower extremity edema.   Na 140, K 4,0 Cl 108, bicarbonate 21, glucose 180, bun 63 cr 3,4  High sensitive troponin 35 and 44  Wbc 9,7 hgb 10,9 and plt 307  SARS covid 19 positive   Chest radiograph with cardiomegaly, cephalization of the vasculature and bilateral hilar vascular congestion, right pleural effusion, small to moderate.   EKG 58 bpm, normal axis, normal intervals, sinus rhythm with no significant ST segment or T wave changes.   Patient was placed on IV furosemide for diuresis Paxlovid for COIVD 19 infection.   Assessment and Plan: * Acute on chronic combined systolic and diastolic CHF (congestive heart failure) ([place]) 04/2021 echocardiogram with preserved LV systolic function EF 60 to 65%.  RV systolic function is preserved. SP aortic valve replacement with no stenosis.  Urine output 5400 ml Systolic blood pressure is 130 to 150 mmHg,  Plan to continue diuresis with furosemide 80 mg TID Continue afterload reduction with hydralazine and isosorbide. Continue labetalol and amlodipine for blood pressure control.   Acute hypoxemic respiratory failure due to acute cardiogenic pulmonary edema.  Oxymetry is 97% on room air.    COVID-19 virus infection Respiratory failure likely more due to pulmonary edema, Patient has high risk for complications due to comorbid medical conditions.   Continue with Paxlovid Hold on steroids, continue oxymetry monitoring.   CKD (chronic kidney disease), stage IV (HCC) Improving volume status, renal function with serum cr at 3,42 with K at 4,7 and serum bicarbonate at 26. Continue close renal function and monitoring, continue with furosemide IV for diuresis.   Coronary artery disease of native artery of native heart with stable angina pectoris (HCC) No chest pain Elevated troponin due to heart failure exacerbation.   DM (diabetes mellitus), type 2 with renal complications (HCC) Continue insulin sliding scale for glucose cover and monitoring. Basal insulin 6 units daily.  Fasting glucose is 100 mg/dl    Essential hypertension Continue blood pressure control with amlodipine and labetalol. Isosorbide and hydralazine.         Subjective: Patient with improvement in dyspnea and lower extremity edema, not yet back to baseline   Physical Exam: Vitals:   09/09/21 0054 09/09/21 0453 09/09/21 1000 09/09/21 1106  BP: (!) 154/56 (!) 162/55 (!) 188/58 (!) 134/49  Pulse:  60 65 (!) 55  Resp: 15 16 16 17   Temp: 97.6 F (36.4 C) 97.8 F (36.6 C)  97.7 F (36.5 C)  TempSrc: Oral Oral  Oral  SpO2: 97% 100% 97% 96%  Weight:  72.8 kg    Height:       Neurology awake and alert ENT with no pallor or icterus Cardiovascular with S1 and S2 present and rhythmic  Positive JVD Lower extremity pitting +  Respiratory  with no rales or wheezing Abdomen with no distention   Data Reviewed:    Family Communication: no family at the bedside   Disposition: Status is: Inpatient Remains inpatient appropriate because: heart failure   Planned Discharge Destination: Home      Author: Tawni Millers, MD 09/09/2021 3:16 PM  For on call review [email or web address].

## 2021-09-10 DIAGNOSIS — I5043 Acute on chronic combined systolic (congestive) and diastolic (congestive) heart failure: Secondary | ICD-10-CM | POA: Diagnosis not present

## 2021-09-10 DIAGNOSIS — U071 COVID-19: Secondary | ICD-10-CM | POA: Diagnosis not present

## 2021-09-10 DIAGNOSIS — N184 Chronic kidney disease, stage 4 (severe): Secondary | ICD-10-CM | POA: Diagnosis not present

## 2021-09-10 DIAGNOSIS — I25118 Atherosclerotic heart disease of native coronary artery with other forms of angina pectoris: Secondary | ICD-10-CM | POA: Diagnosis not present

## 2021-09-10 LAB — GLUCOSE, CAPILLARY
Glucose-Capillary: 159 mg/dL — ABNORMAL HIGH (ref 70–99)
Glucose-Capillary: 124 mg/dL — ABNORMAL HIGH (ref 70–99)
Glucose-Capillary: 271 mg/dL — ABNORMAL HIGH (ref 70–99)
Glucose-Capillary: 106 mg/dL — ABNORMAL HIGH (ref 70–99)

## 2021-09-10 LAB — BASIC METABOLIC PANEL
Anion gap: 11 (ref 5–15)
BUN: 64 mg/dL — ABNORMAL HIGH (ref 8–23)
CO2: 23 mmol/L (ref 22–32)
Calcium: 8.7 mg/dL — ABNORMAL LOW (ref 8.9–10.3)
Chloride: 103 mmol/L (ref 98–111)
Creatinine, Ser: 3.7 mg/dL — ABNORMAL HIGH (ref 0.44–1.00)
GFR, Estimated: 12 mL/min — ABNORMAL LOW (ref >60)
Glucose, Bld: 156 mg/dL — ABNORMAL HIGH (ref 70–99)
Potassium: 3.8 mmol/L (ref 3.5–5.1)
Sodium: 137 mmol/L (ref 135–145)

## 2021-09-10 NOTE — Progress Notes (Signed)
Occupational Therapy Treatment Patient Details Name: [name] MRN: [id number] DOB: 1940/05/16 Today's Date: 09/10/2021   History of present illness The pt is an 81 yo female presenting 8/22 with SOB. Pt with CHF exacerbation and found to be COVID + and with R-sided pleural effusion. Pt with h/o CVA, CAD, CKD V, HTN, HLD, AVR, PVD.   OT comments  Pt progressing towards acute OT goals. Pt c/o fatigue and some SOB with OOB activity but able to complete 2 grooming tasks standing at the sink then walked distance to access bathroom. Began education on energy conservation strategies as related to managing ADLs. VSS on RA. D/c recommendations remain appropriate.    Recommendations for follow up therapy are one component of a multi-disciplinary discharge planning process, led by the attending physician.  Recommendations may be updated based on patient status, additional functional criteria and insurance authorization.    Follow Up Recommendations  Home health OT    Assistance Recommended at Discharge Frequent or constant Supervision/Assistance  Patient can return home with the following  A little help with walking and/or transfers;A little help with bathing/dressing/bathroom;Help with stairs or ramp for entrance;Assist for transportation   Equipment Recommendations  None recommended by OT    Recommendations for Other Services      Precautions / Restrictions Precautions Precautions: Fall Precaution Comments: SpO2 stable on RA, DOE 1/4. Restrictions Weight Bearing Restrictions: No       Mobility Bed Mobility Overal bed mobility: Needs Assistance Bed Mobility: Supine to Sit     Supine to sit: Min guard, HOB elevated          Transfers Overall transfer level: Needs assistance Equipment used: Rolling walker (2 wheels) Transfers: Sit to/from Stand Sit to Stand: Min guard           General transfer comment: to/from EOB and recliner. Pt verbalized feeling like she needed  walker for balance today.     Balance Overall balance assessment: Needs assistance Sitting-balance support: Feet supported Sitting balance-Leahy Scale: Good     Standing balance support: During functional activity, Bilateral upper extremity supported, No upper extremity supported Standing balance-Leahy Scale: Fair Standing balance comment: stood at sink to complete 2 grooming tasks, intermittent single UE support. BUE for mobility, pt fatigues easily.                           ADL either performed or assessed with clinical judgement   ADL Overall ADL's : Needs assistance/impaired     Grooming: Supervision/safety;Standing;Wash/dry face;Oral care                               Functional mobility during ADLs: Rolling walker (2 wheels);Minimal assistance General ADL Comments: Limited by SOB with minimal activity, SpO2 99 on RA. Stood to complete 2 grooming tasks in standing then walked distance to access bathroom. Up in recliner, fatigued at end of session. Began education on energy conservation strategies as applies to ADL management.    Extremity/Trunk Assessment Upper Extremity Assessment Upper Extremity Assessment: Overall WFL for tasks assessed;Generalized weakness   Lower Extremity Assessment Lower Extremity Assessment: Defer to PT evaluation        Vision       Perception     Praxis      Cognition Arousal/Alertness: Awake/alert Behavior During Therapy: WFL for tasks assessed/performed Overall Cognitive Status: Within Functional Limits for tasks assessed  General Comments: very fatigued        Exercises      Shoulder Instructions       General Comments VSS on RA.    Pertinent Vitals/ Pain       Pain Assessment Pain Assessment: No/denies pain  Home Living                                          Prior Functioning/Environment              Frequency  Min  2X/week        Progress Toward Goals  OT Goals(current goals can now be found in the care plan section)  Progress towards OT goals: Progressing toward goals  Acute Rehab OT Goals Patient Stated Goal: to get stronger to be able to go home OT Goal Formulation: With patient Time For Goal Achievement: 09/22/21 Potential to Achieve Goals: Good ADL Goals Additional ADL Goal #1: Pt will gather clothing with walker and dress self with 2 rest breaks and no physical assist. Additional ADL Goal #2: Pt wil walk to bathroom and complete all toileting and wash hands with distant supervision. Additional ADL Goal #3: pt will state two things she can do at home to conserve energy throughout the day without cues.  Plan Discharge plan remains appropriate    Co-evaluation                 AM-PAC OT "6 Clicks" Daily Activity     Outcome Measure   Help from another person eating meals?: None Help from another person taking care of personal grooming?: None Help from another person toileting, which includes using toliet, bedpan, or urinal?: A Little Help from another person bathing (including washing, rinsing, drying)?: A Little Help from another person to put on and taking off regular upper body clothing?: A Little Help from another person to put on and taking off regular lower body clothing?: A Little 6 Click Score: 20    End of Session Equipment Utilized During Treatment: Rolling walker (2 wheels)  OT Visit Diagnosis: Unsteadiness on feet (R26.81)   Activity Tolerance Patient limited by fatigue;Patient tolerated treatment well   Patient Left in chair;with call bell/phone within reach   Nurse Communication          Time: 7510-2585 OT Time Calculation (min): 22 min  Charges: OT General Charges $OT Visit: 1 Visit OT Treatments $Self Care/Home Management : 8-22 mins  Tyrone Schimke, OT Acute Rehabilitation Services Office: [phone]   Hortencia Pilar 09/10/2021,  12:03 PM

## 2021-09-10 NOTE — Progress Notes (Signed)
Progress Note   Patient: [name] QJJ:[id number] DOB: 1940-11-06 DOA: 09/07/2021     3 DOS: the patient was seen and examined on 09/10/2021   Brief hospital course: [name] as admitted to the hospital with the working diagnosis of decompensated heart failure.   81 yo female with the past medical history of hypertension, coronary artery disease, CKD and T2DM ho presented with dyspnea. Reported progressive dyspnea and lower extremity edema. Recent upper respiratory tract infection. On her initial physical examination her blood pressure was 145/65, HR 58, RR 18 and 02 saturation 93%, patient was placed on Bipap due to respiratory distress. Lungs with no wheezing or rhonchi, heart with S1 and S2 present and rhythmic, abdomen not distended and trace lower extremity edema.   Na 140, K 4,0 Cl 108, bicarbonate 21, glucose 180, bun 63 cr 3,4  High sensitive troponin 35 and 44  Wbc 9,7 hgb 10,9 and plt 307  SARS covid Kennedy positive   Chest radiograph with cardiomegaly, cephalization of the vasculature and bilateral hilar vascular congestion, right pleural effusion, small to moderate.   EKG 58 bpm, normal axis, normal intervals, sinus rhythm with no significant ST segment or T wave changes.   Patient was placed on IV furosemide for diuresis Paxlovid for [name] infection.   Patient has responded to to diuresis with furosemide, improvement in volume status and oxygenation. Plan to discharge in 24 hrs pending improvement in her renal function.   Assessment and Plan: * Acute on chronic combined systolic and diastolic CHF (congestive heart failure) ([name]) 04/2021 echocardiogram with preserved LV systolic function EF 60 to 65%.  RV systolic function is preserved. SP aortic valve replacement with no stenosis.  Urine output 4818 ml Systolic blood pressure is 140 to 110 mmHg, Net negative fluid balance since admission is -4,184 ml.   Continue after load reduction with hydralazine and  isosorbide. Continue labetalol and amlodipine for blood pressure control.  Holding on furosemide for now, resume oral loop diuretic therapy when renal function improves.   Acute hypoxemic respiratory failure due to acute cardiogenic pulmonary edema.  Oxymetry is 97% on room air.   COVID-Kennedy virus infection Respiratory failure likely more due to pulmonary edema, Patient has high risk for complications due to comorbid medical conditions.   Continue with Paxlovid Hold on steroids, continue oxymetry monitoring.   CKD (chronic kidney disease), stage IV ([name]) Patient is euvolemic on her physical examination, her renal function today has a serum cr of 3,70 with K at 3,8 and serum bicarbonate at 23.  Plan to continue blood pressure control and hold on diuretic therapy for now. Follow up renal function in am, if improvement in serum cr will resume oral diuretic therapy.   Coronary artery disease of native artery of native heart with stable angina pectoris ([name]) No chest pain Elevated troponin due to heart failure exacerbation.   DM (diabetes mellitus), type 2 with renal complications ([name]) Continue insulin sliding scale for glucose cover and monitoring. Basal insulin 6 units daily.  Fasting glucose is 156 mg/dl    Essential hypertension Continue blood pressure control with amlodipine and labetalol. Isosorbide and hydralazine.         Subjective: Patient with no chest pain or dyspnea, edema has improved. Feeling tired and deconditioned today. Tolerating po well.   Physical Exam: Vitals:   09/09/21 1626 09/09/21 2229 09/10/21 0654 09/10/21 1210  BP:  (!) 152/67 (!) 140/61 (!) 110/57  Pulse:  68 (!) 59   Resp:  17 Kennedy 15 13   Temp:  98.3 F (36.8 C) 98.4 F (36.9 C)   TempSrc:  Oral Oral   SpO2:  98% 97%   Weight:      Height:       Neurology awake and alert ENT with mild pallor Cardiovascular with S1 and S2 present and rhythmic Respiratory with no rales or wheezing Abdomen  with no distention  Trace non pitting lower extremity edema  Data Reviewed:    Family Communication: no family at the bedside   Disposition: Status is: Inpatient Remains inpatient appropriate because: heart failure and renal failure    Planned Discharge Destination: Home      Author: Tawni Millers, MD 09/10/2021 1:36 PM  For on call review [email or web address].

## 2021-09-10 NOTE — Progress Notes (Signed)
Physical Therapy Treatment Patient Details Name: [name] MRN: [id number] DOB: 31-Mar-1940 Today's Date: 09/10/2021   History of Present Illness The pt is an 81 yo female presenting 8/22 with SOB. Pt with CHF exacerbation and found to be COVID + and with R-sided pleural effusion. Pt with h/o CVA, CAD, CKD V, HTN, HLD, AVR, PVD.    PT Comments    Pt tolerates treatment well, ambulating for increased distances and for multiple trials. Pt reports feeling much better than prior to admission and closer to her baseline. Pt expresses a goal to return to walking without an assistive device and would like to build her endurance. Acute PT will continue to follow.   Recommendations for follow up therapy are one component of a multi-disciplinary discharge planning process, led by the attending physician.  Recommendations may be updated based on patient status, additional functional criteria and insurance authorization.  Follow Up Recommendations  Home health PT     Assistance Recommended at Discharge Intermittent Supervision/Assistance  Patient can return home with the following Assistance with cooking/housework;Direct supervision/assist for medications management;Direct supervision/assist for financial management;Assist for transportation;Help with stairs or ramp for entrance   Equipment Recommendations  None recommended by PT    Recommendations for Other Services       Precautions / Restrictions Precautions Precautions: Fall Precaution Comments: SpO2 stable on RA, DOE 1/4. Restrictions Weight Bearing Restrictions: No     Mobility  Bed Mobility                    Transfers Overall transfer level: Needs assistance Equipment used: Rolling walker (2 wheels) Transfers: Sit to/from Stand Sit to Stand: Supervision           General transfer comment: quick descent to sitting    Ambulation/Gait Ambulation/Gait assistance: Modified independent (Device/Increase time) Gait  Distance (Feet): 200 Feet (200' x 2 trials) Assistive device: Rolling walker (2 wheels) Gait Pattern/deviations: Step-through pattern Gait velocity: decreased Gait velocity interpretation: <1.8 ft/sec, indicate of risk for recurrent falls   General Gait Details: pt with slowed step-through gait, no balance deviations noted   Stairs             Wheelchair Mobility    Modified Rankin (Stroke Patients Only)       Balance Overall balance assessment: Needs assistance Sitting-balance support: No upper extremity supported, Feet supported Sitting balance-Leahy Scale: Good     Standing balance support: Single extremity supported, No upper extremity supported Standing balance-Leahy Scale: Fair                              Cognition Arousal/Alertness: Awake/alert Behavior During Therapy: WFL for tasks assessed/performed Overall Cognitive Status: Within Functional Limits for tasks assessed                                          Exercises      General Comments General comments (skin integrity, edema, etc.): VSS on RA.      Pertinent Vitals/Pain Pain Assessment Pain Assessment: No/denies pain    Home Living                          Prior Function            PT Goals (current goals can now be found in  the care plan section) Acute Rehab PT Goals Patient Stated Goal: return home and to water aerobics Progress towards PT goals: Progressing toward goals    Frequency    Min 3X/week      PT Plan Current plan remains appropriate    Co-evaluation              AM-PAC PT "6 Clicks" Mobility   Outcome Measure  Help needed turning from your back to your side while in a flat bed without using bedrails?: A Little Help needed moving from lying on your back to sitting on the side of a flat bed without using bedrails?: A Little Help needed moving to and from a bed to a chair (including a wheelchair)?: A Little Help  needed standing up from a chair using your arms (e.g., wheelchair or bedside chair)?: A Little Help needed to walk in hospital room?: A Little Help needed climbing 3-5 steps with a railing? : A Little 6 Click Score: 18    End of Session   Activity Tolerance: Patient tolerated treatment well Patient left: in chair;with call bell/phone within reach Nurse Communication: Mobility status PT Visit Diagnosis: Other abnormalities of gait and mobility (R26.89);Muscle weakness (generalized) (M62.81)     Time: 0938-1829 PT Time Calculation (min) (ACUTE ONLY): 27 min  Charges:  $Gait Training: 23-37 mins                     Zenaida Niece, PT, DPT Acute Rehabilitation Office [place] 09/10/2021, 1:37 PM

## 2021-09-11 DIAGNOSIS — N189 Chronic kidney disease, unspecified: Secondary | ICD-10-CM

## 2021-09-11 DIAGNOSIS — N179 Acute kidney failure, unspecified: Secondary | ICD-10-CM | POA: Diagnosis not present

## 2021-09-11 DIAGNOSIS — I5043 Acute on chronic combined systolic (congestive) and diastolic (congestive) heart failure: Secondary | ICD-10-CM | POA: Diagnosis not present

## 2021-09-11 DIAGNOSIS — I25118 Atherosclerotic heart disease of native coronary artery with other forms of angina pectoris: Secondary | ICD-10-CM | POA: Diagnosis not present

## 2021-09-11 DIAGNOSIS — U071 COVID-19: Secondary | ICD-10-CM | POA: Diagnosis not present

## 2021-09-11 LAB — BASIC METABOLIC PANEL
Anion gap: 10 (ref 5–15)
BUN: 67 mg/dL — ABNORMAL HIGH (ref 8–23)
CO2: 22 mmol/L (ref 22–32)
Calcium: 8.6 mg/dL — ABNORMAL LOW (ref 8.9–10.3)
Chloride: 106 mmol/L (ref 98–111)
Creatinine, Ser: 3.95 mg/dL — ABNORMAL HIGH (ref 0.44–1.00)
GFR, Estimated: 11 mL/min — ABNORMAL LOW (ref >60)
Glucose, Bld: 92 mg/dL (ref 70–99)
Potassium: 3.4 mmol/L — ABNORMAL LOW (ref 3.5–5.1)
Sodium: 138 mmol/L (ref 135–145)

## 2021-09-11 LAB — GLUCOSE, CAPILLARY
Glucose-Capillary: 80 mg/dL (ref 70–99)
Glucose-Capillary: 140 mg/dL — ABNORMAL HIGH (ref 70–99)
Glucose-Capillary: 223 mg/dL — ABNORMAL HIGH (ref 70–99)
Glucose-Capillary: 218 mg/dL — ABNORMAL HIGH (ref 70–99)

## 2021-09-11 MED ORDER — POTASSIUM CHLORIDE CRYS ER 20 MEQ PO TBCR
40.0000 meq | EXTENDED_RELEASE_TABLET | Freq: Once | ORAL | Status: AC
Start: 2021-09-11 — End: 2021-09-11
  Administered 2021-09-11: 40 meq via ORAL
  Filled 2021-09-11: qty 2

## 2021-09-11 NOTE — Progress Notes (Addendum)
Progress Note   Patient: [name]:[id number] DOB: 08-Mar-1940 DOA: 09/07/2021     4 DOS: the patient was seen and examined on 09/11/2021   Brief hospital course: Mrs. [name] as admitted to the hospital with the working diagnosis of decompensated heart failure.   81 yo female with the past medical history of hypertension, coronary artery disease, CKD and T2DM ho presented with dyspnea. Reported progressive dyspnea and lower extremity edema. Recent upper respiratory tract infection. On her initial physical examination her blood pressure was 145/65, HR 58, RR 18 and 02 saturation 93%, patient was placed on Bipap due to respiratory distress. Lungs with no wheezing or rhonchi, heart with S1 and S2 present and rhythmic, abdomen not distended and trace lower extremity edema.   Na 140, K 4,0 Cl 108, bicarbonate 21, glucose 180, bun 63 cr 3,4  High sensitive troponin 35 and 44  Wbc 9,7 hgb 10,9 and plt 307  SARS covid 19 positive   Chest radiograph with cardiomegaly, cephalization of the vasculature and bilateral hilar vascular congestion, right pleural effusion, small to moderate.   EKG 58 bpm, normal axis, normal intervals, sinus rhythm with no significant ST segment or T wave changes.   Patient was placed on IV furosemide for diuresis Paxlovid for COIVD 19 infection.   Patient has responded to to diuresis with furosemide, improvement in volume status and oxygenation. Plan to discharge in 24 hrs pending improvement in her renal function.   Assessment and Plan: * Acute on chronic combined systolic and diastolic CHF (congestive heart failure) ([place]) 04/2021 echocardiogram with preserved LV systolic function EF 60 to 65%.  RV systolic function is preserved. SP aortic valve replacement with no stenosis.  Fluid balance has improved, currently holding on diuretic therapy due to worsening renal function per serum cr.   Plan to continue after load reduction with hydralazine and  isosorbide. Continue labetalol and amlodipine for blood pressure control.   Acute hypoxemic respiratory failure due to acute cardiogenic pulmonary edema.  Oxymetry is 97% on room air.   COVID-19 virus infection Respiratory failure likely more due to pulmonary edema, Patient has high risk for complications due to comorbid medical conditions.   Continue with Paxlovid Hold on steroids, continue oxymetry monitoring.   Acute kidney injury superimposed on chronic kidney disease (HCC) CKD stage 4, hypokalemia.   Clinically patient with improvement in volume status, renal function not stable yet.   Today serum cr is 3,9 with serum bicarbonate at 22 and K at 3,4. Na 138. Systolic blood pressure 956 to 150 mmHg.   Add 40 meq po today.  Plan to continue to hold on diuretic therapy for now and follow up renal function in am, avoid hypotension and nephrotoxic medications.   Anemia of chronic renal disease, hgb has been stable at 10.6   Coronary artery disease of native artery of native heart with stable angina pectoris (HCC) No chest pain Elevated troponin due to heart failure exacerbation.   DM (diabetes mellitus), type 2 with renal complications (HCC) Continue insulin sliding scale for glucose cover and monitoring. Basal insulin 6 units daily.  Fasting glucose is 92 mg/dl    Essential hypertension Continue blood pressure control with amlodipine and labetalol. Isosorbide and hydralazine.         Subjective: Patient is feeling better, dyspnea and edema have improved, no chest pain, and tolerating po well.   Physical Exam: Vitals:   09/10/21 1210 09/10/21 1953 09/11/21 0610 09/11/21 0806  BP: (!) 110/57 Marland Kitchen)  141/53 (!) 127/59 (!) 158/55  Pulse:  (!) 56 (!) 52 62  Resp: 13 18 15 14   Temp:  97.8 F (36.6 C) 97.7 F (36.5 C) 98.3 F (36.8 C)  TempSrc:  Oral Oral Oral  SpO2:  98% 98% 97%  Weight:   72.5 kg   Height:       Neurology awake and alert ENT with mild  pallor Cardiovascular with S1 and S2 present and rhythmic with no gallops, rubs or murmurs Positive mild JVD No lower extremity edema Respiratory with no wheezing or rhonchi Abdomen with no distention  Data Reviewed:    Family Communication: no family at the bedside   Disposition: Status is: Inpatient Remains inpatient appropriate because: renal failure   Planned Discharge Destination: Home    Author: Tawni Millers, MD 09/11/2021 9:26 AM  For on call review [email or web address].

## 2021-09-12 DIAGNOSIS — I5043 Acute on chronic combined systolic (congestive) and diastolic (congestive) heart failure: Secondary | ICD-10-CM | POA: Diagnosis not present

## 2021-09-12 DIAGNOSIS — N179 Acute kidney failure, unspecified: Secondary | ICD-10-CM | POA: Diagnosis not present

## 2021-09-12 DIAGNOSIS — U071 COVID-19: Secondary | ICD-10-CM | POA: Diagnosis not present

## 2021-09-12 DIAGNOSIS — I25118 Atherosclerotic heart disease of native coronary artery with other forms of angina pectoris: Secondary | ICD-10-CM | POA: Diagnosis not present

## 2021-09-12 LAB — GLUCOSE, CAPILLARY
Glucose-Capillary: 95 mg/dL (ref 70–99)
Glucose-Capillary: 122 mg/dL — ABNORMAL HIGH (ref 70–99)
Glucose-Capillary: 173 mg/dL — ABNORMAL HIGH (ref 70–99)
Glucose-Capillary: 206 mg/dL — ABNORMAL HIGH (ref 70–99)

## 2021-09-12 LAB — RENAL FUNCTION PANEL
Albumin: 2.8 g/dL — ABNORMAL LOW (ref 3.5–5.0)
Anion gap: 12 (ref 5–15)
BUN: 72 mg/dL — ABNORMAL HIGH (ref 8–23)
CO2: 23 mmol/L (ref 22–32)
Calcium: 8.7 mg/dL — ABNORMAL LOW (ref 8.9–10.3)
Chloride: 103 mmol/L (ref 98–111)
Creatinine, Ser: 4.25 mg/dL — ABNORMAL HIGH (ref 0.44–1.00)
GFR, Estimated: 10 mL/min — ABNORMAL LOW (ref >60)
Glucose, Bld: 99 mg/dL (ref 70–99)
Phosphorus: 3.4 mg/dL (ref 2.5–4.6)
Potassium: 4.1 mmol/L (ref 3.5–5.1)
Sodium: 138 mmol/L (ref 135–145)

## 2021-09-12 MED ORDER — FUROSEMIDE 10 MG/ML IJ SOLN
80.0000 mg | Freq: Once | INTRAMUSCULAR | Status: AC
  Administered 2021-09-12: 80 mg via INTRAVENOUS
  Filled 2021-09-12: qty 8

## 2021-09-12 MED ORDER — EVOLOCUMAB 140 MG/ML ~~LOC~~ SOAJ
1.0000 mL | SUBCUTANEOUS | Status: DC
  Administered 2021-09-12: 1 mL via SUBCUTANEOUS

## 2021-09-12 NOTE — Progress Notes (Signed)
Progress Note   Patient: [name] FOY:[id number] DOB: Jun 07, 1940 DOA: 09/07/2021     5 DOS: the patient was seen and examined on 09/12/2021   Brief hospital course: Mrs. Esson as admitted to the hospital with the working diagnosis of decompensated heart failure.   81 yo female with the past medical history of hypertension, coronary artery disease, CKD and T2DM ho presented with dyspnea. Reported progressive dyspnea and lower extremity edema. Recent upper respiratory tract infection. On her initial physical examination her blood pressure was 145/65, HR 58, RR 18 and 02 saturation 93%, patient was placed on Bipap due to respiratory distress. Lungs with no wheezing or rhonchi, heart with S1 and S2 present and rhythmic, abdomen not distended and trace lower extremity edema.   Na 140, K 4,0 Cl 108, bicarbonate 21, glucose 180, bun 63 cr 3,4  High sensitive troponin 35 and 44  Wbc 9,7 hgb 10,9 and plt 307  SARS covid 19 positive   Chest radiograph with cardiomegaly, cephalization of the vasculature and bilateral hilar vascular congestion, right pleural effusion, small to moderate.   EKG 58 bpm, normal axis, normal intervals, sinus rhythm with no significant ST segment or T wave changes.   Patient was placed on IV furosemide for diuresis Paxlovid for COIVD 19 infection.   Patient has responded to to diuresis with furosemide, improvement in volume status and oxygenation. Plan to discharge in 24 hrs pending improvement in her renal function.   Assessment and Plan: * Acute on chronic combined systolic and diastolic CHF (congestive heart failure) ([place]) 04/2021 echocardiogram with preserved LV systolic function EF 60 to 65%.  RV systolic function is preserved. SP aortic valve replacement with no stenosis.  Plan to continue after load reduction with hydralazine and isosorbide. Continue labetalol and amlodipine for blood pressure control.   Positive JVD and trace lower extremity edema, plan to  resume furosemide with IV 80 mg x1 dose.   Acute hypoxemic respiratory failure due to acute cardiogenic pulmonary edema.  Oxymetry is 97% on room air.   COVID-19 virus infection Respiratory failure likely more due to pulmonary edema, Patient has high risk for complications due to comorbid medical conditions.   Continue with Paxlovid Hold on steroids, continue oxymetry monitoring.   Acute kidney injury superimposed on chronic kidney disease (HCC) CKD stage 4, hypokalemia.   Her documented urine output is 900 cc over last 24 hrs, she has trace edema and positive JVD.  Renal function with worsening serum cr at 4.25 with K at 4,1 and serum bicarbonate at 23.   Plan to resume furosemide to 80 mg IV x1 and follow up renal function in am.  Avoid hypotension or nephrotoxic medications.   Anemia of chronic renal disease, hgb has been stable at 10.6   Coronary artery disease of native artery of native heart with stable angina pectoris (HCC) No chest pain Elevated troponin due to heart failure exacerbation.   DM (diabetes mellitus), type 2 with renal complications (HCC) Continue insulin sliding scale for glucose cover and monitoring. Basal insulin 6 units daily.  Fasting glucose is 99 mg/dl    Essential hypertension Continue blood pressure control with amlodipine and labetalol. Isosorbide and hydralazine.         Subjective: Patient with no chest pain, no dyspnea, trace lower extremity edema   Physical Exam: Vitals:   09/11/21 2000 09/11/21 2128 09/12/21 0241 09/12/21 0628  BP: (!) 128/50 (!) 133/43 (!) 141/51 (!) 136/52  Pulse: 62 (!) 58 (!) 55 Marland Kitchen)  57  Resp: 13 20 13 15   Temp: 97.8 F (36.6 C) 98.4 F (36.9 C) 98.2 F (36.8 C) 97.8 F (36.6 C)  TempSrc: Oral Oral Oral Oral  SpO2: 100% 97% 97% 99%  Weight:    73.1 kg  Height:       Neurology awake and alert ENT with mild pallor Cardiovascular with S1 and S2 present and rhythmic with no gallops Respiratory with mild  rales at bases with no wheezing or rhonchi Abdomen with no distention  Trace lower extremity edema  Data Reviewed:    Family Communication: I spoke over the phone with the patient's daughter about patient's  condition, plan of care, prognosis and all questions were addressed.   Disposition: Status is: Inpatient Remains inpatient appropriate because: renal failure   Planned Discharge Destination: Home    Author: Tawni Millers, MD 09/12/2021 9:54 AM  For on call review [email or web address].

## 2021-09-12 NOTE — Plan of Care (Signed)
  Problem: Education: Goal: Knowledge of General Education information will improve Description: Including pain rating scale, medication(s)/side effects and non-pharmacologic comfort measures Outcome: Progressing   Problem: Health Behavior/Discharge Planning: Goal: Ability to manage health-related needs will improve Outcome: Progressing   Problem: Clinical Measurements: Goal: Respiratory complications will improve Outcome: Progressing   Problem: Clinical Measurements: Goal: Cardiovascular complication will be avoided Outcome: Progressing   Problem: Safety: Goal: Ability to remain free from injury will improve Outcome: Progressing   Problem: Fluid Volume: Goal: Ability to maintain a balanced intake and output will improve Outcome: Progressing   Problem: Respiratory: Goal: Complications related to the disease process, condition or treatment will be avoided or minimized Outcome: Progressing

## 2021-09-13 ENCOUNTER — Inpatient Hospital Stay (HOSPITAL_COMMUNITY): Payer: Medicare PPO

## 2021-09-13 DIAGNOSIS — N179 Acute kidney failure, unspecified: Secondary | ICD-10-CM | POA: Diagnosis not present

## 2021-09-13 DIAGNOSIS — I5043 Acute on chronic combined systolic (congestive) and diastolic (congestive) heart failure: Secondary | ICD-10-CM | POA: Diagnosis not present

## 2021-09-13 DIAGNOSIS — I25118 Atherosclerotic heart disease of native coronary artery with other forms of angina pectoris: Secondary | ICD-10-CM | POA: Diagnosis not present

## 2021-09-13 DIAGNOSIS — U071 COVID-19: Secondary | ICD-10-CM | POA: Diagnosis not present

## 2021-09-13 LAB — BASIC METABOLIC PANEL
Anion gap: 11 (ref 5–15)
BUN: 70 mg/dL — ABNORMAL HIGH (ref 8–23)
CO2: 24 mmol/L (ref 22–32)
Calcium: 9.4 mg/dL (ref 8.9–10.3)
Chloride: 104 mmol/L (ref 98–111)
Creatinine, Ser: 4.35 mg/dL — ABNORMAL HIGH (ref 0.44–1.00)
GFR, Estimated: 10 mL/min — ABNORMAL LOW (ref >60)
Glucose, Bld: 111 mg/dL — ABNORMAL HIGH (ref 70–99)
Potassium: 4.3 mmol/L (ref 3.5–5.1)
Sodium: 139 mmol/L (ref 135–145)

## 2021-09-13 LAB — GLUCOSE, CAPILLARY
Glucose-Capillary: 89 mg/dL (ref 70–99)
Glucose-Capillary: 170 mg/dL — ABNORMAL HIGH (ref 70–99)
Glucose-Capillary: 122 mg/dL — ABNORMAL HIGH (ref 70–99)
Glucose-Capillary: 163 mg/dL — ABNORMAL HIGH (ref 70–99)

## 2021-09-13 NOTE — Progress Notes (Signed)
Progress Note   Patient: [name] DOB: 1940-12-23 DOA: 09/07/2021     6 DOS: the patient was seen and examined on 09/13/2021   Brief hospital course: [name] as admitted to the hospital with the working diagnosis of decompensated heart failure.   81 yo female with the past medical history of hypertension, coronary artery disease, CKD and T2DM ho presented with dyspnea. Reported progressive dyspnea and lower extremity edema. Recent upper respiratory tract infection. On her initial physical examination her blood pressure was 145/65, HR 58, RR 18 and 02 saturation 93%, patient was placed on Bipap due to respiratory distress. Lungs with no wheezing or rhonchi, heart with S1 and S2 present and rhythmic, abdomen not distended and trace lower extremity edema.   Na 140, K 4,0 Cl 108, bicarbonate 21, glucose 180, bun 63 cr 3,4  High sensitive troponin 35 and 44  Wbc 9,7 hgb 10,9 and plt 307  SARS covid 19 positive   Chest radiograph with cardiomegaly, cephalization of the vasculature and bilateral hilar vascular congestion, right pleural effusion, small to moderate.   EKG 58 bpm, normal axis, normal intervals, sinus rhythm with no significant ST segment or T wave changes.   Patient was placed on IV furosemide for diuresis Paxlovid for COIVD 19 infection.   Patient has responded to to diuresis with furosemide, improvement in volume status and oxygenation.  Her renal function has not stabilized prolonging her hospitalization.    Assessment and Plan: * Acute on chronic combined systolic and diastolic CHF (congestive heart failure) ([place]) 04/2021 echocardiogram with preserved LV systolic function EF 60 to 65%.  RV systolic function is preserved. SP aortic valve replacement with no stenosis.  Urine output documented 790 cc  Systolic blood pressure 240 to 140 mmHg.   Continue with hydralazine and isosorbide. Labetalol and amlodipine for blood pressure control.  Hold on  diuretic therapy for now.   Acute hypoxemic respiratory failure due to acute cardiogenic pulmonary edema.  Oxymetry is 97% on room air.   COVID-19 virus infection Respiratory failure likely more due to pulmonary edema, Patient has high risk for complications due to comorbid medical conditions.   Patient has completed 5 doses of Paxlovid (renally dose adjusted).   Acute kidney injury superimposed on chronic kidney disease (HCC) CKD stage 4, hypokalemia.   Renal function not yet stable with a serum cr at 4,34, K is 4,3 and serum bicarbonate at 24. Patient has been up to 5 serum cr in 06/23.   Will hold on furosemide for today and follow up renal function in am.  Abdominal MRI from 2020 with left kidney lower pole cystic lesion suspicious for neoplasm 4,1 x 3,0 x 2,7 cm. Will plan to repeat imaging with renal US. Follow up renal function in am, avoid hypotension and nephrotoxic medications.   Anemia of chronic renal disease, hgb has been stable at 10.6   Coronary artery disease of native artery of native heart with stable angina pectoris (HCC) No chest pain Elevated troponin due to heart failure exacerbation.   DM (diabetes mellitus), type 2 with renal complications (HCC) Continue insulin sliding scale for glucose cover and monitoring. Basal insulin 6 units daily.  Fasting glucose is 111 mg/dl    Essential hypertension Continue blood pressure control with amlodipine and labetalol. Isosorbide and hydralazine.         Subjective: Patient is feeling well with no dyspnea or chest pain, no lower extremity edema   Physical Exam: Vitals:   09/13/21  1115 09/13/21 0811 09/13/21 1158 09/13/21 1501  BP:  (!) 131/43 (!) 143/49 (!) 139/53  Pulse:  60 (!) 57   Resp:   20   Temp:   (!) 97.3 F (36.3 C)   TempSrc:   Oral   SpO2:   98%   Weight: 73.4 kg     Height:       Neurology awake and alert ENT with mild pallor Cardiovascular with S1 and S2 present and rhythmic with no  gallops or rubs, positive systolic murmur at the apex Respiratory with no rales or wheezing Abdomen with no distention  No lower extremity edema  Data Reviewed:    Family Communication: no family at the bedside   Disposition: Status is: Inpatient Remains inpatient appropriate because: heart failure and renal failure   Planned Discharge Destination: Home    Author: Tawni Millers, MD 09/13/2021 4:13 PM  For on call review [email or web address].

## 2021-09-13 NOTE — Progress Notes (Signed)
Occupational Therapy Treatment Patient Details Name: [name] MRN: [id number] DOB: Oct 14, 1940 Today's Date: 09/13/2021   History of present illness The pt is an 81 yo female presenting 8/22 with SOB. Pt with CHF exacerbation and found to be COVID + and with R-sided pleural effusion. Pt with h/o CVA, CAD, CKD V, HTN, HLD, AVR, PVD.   OT comments  Pt has made excellent progress toward all goals and increasing independence  and tolerance of all adls. Goal one met and close to meeting goals 2-3. Pt is supervision in room with most adls at this time.  Pt with less SOB during adls this am.  Feel pt will not need OT follow up at d/c and pt states family can check in on her.  Will continue to see to focus on energy conservation tasks.     Recommendations for follow up therapy are one component of a multi-disciplinary discharge planning process, led by the attending physician.  Recommendations may be updated based on patient status, additional functional criteria and insurance authorization.    Follow Up Recommendations  No OT follow up    Assistance Recommended at Discharge Intermittent Supervision/Assistance  Patient can return home with the following  A little help with walking and/or transfers;A little help with bathing/dressing/bathroom;Assistance with cooking/housework;Assist for transportation   Equipment Recommendations  None recommended by OT    Recommendations for Other Services      Precautions / Restrictions Precautions Precautions: Fall Restrictions Weight Bearing Restrictions: No       Mobility Bed Mobility Overal bed mobility: Needs Assistance Bed Mobility: Sit to Supine       Sit to supine: Supervision   General bed mobility comments: No physical assist needed. Pt used bedrails and instructed pt on how to move head and foot of bed up and down    Transfers Overall transfer level: Needs assistance Equipment used: Rolling walker (2 wheels) Transfers: Sit  to/from Stand Sit to Stand: Supervision           General transfer comment: Pt transferring with increased independence and less SOB     Balance Overall balance assessment: Needs assistance Sitting-balance support: Feet supported Sitting balance-Leahy Scale: Good     Standing balance support: During functional activity, Bilateral upper extremity supported, No upper extremity supported Standing balance-Leahy Scale: Fair Standing balance comment: Pt able to let go of walker safely to toilet and groom at sink for up to 30 seconds at a time.                           ADL either performed or assessed with clinical judgement   ADL Overall ADL's : Needs assistance/impaired Eating/Feeding: Independent;Sitting   Grooming: Wash/dry hands;Wash/dry face;Oral care;Brushing hair;Supervision/safety;Standing Grooming Details (indicate cue type and reason): no physical assist needed. Distant supervision provided.                 Toilet Transfer: Supervision/safety;Ambulation;Rolling walker (2 wheels);Comfort height toilet;Grab bars Toilet Transfer Details (indicate cue type and reason): Pt walked to bathroom with walker and completed all toileting with distant supervision only for lines. No LOB. Toileting- Clothing Manipulation and Hygiene: Supervision/safety;Sit to/from stand Toileting - Clothing Manipulation Details (indicate cue type and reason): no physical assist given     Functional mobility during ADLs: Supervision/safety;Rolling walker (2 wheels) General ADL Comments: Pt completed toileting and grooming tasks in bathroom with no physical assist. Pt up and moving/standing in room for 20+ minutes. No LOB or  SOB noted.  Pt asked to return to bed bc has been up in chair for last 4 hours.    Extremity/Trunk Assessment Upper Extremity Assessment Upper Extremity Assessment: Overall WFL for tasks assessed   Lower Extremity Assessment Lower Extremity Assessment: Defer to PT  evaluation        Vision   Vision Assessment?: No apparent visual deficits   Perception Perception Perception: Within Functional Limits   Praxis Praxis Praxis: Intact    Cognition Arousal/Alertness: Awake/alert Behavior During Therapy: WFL for tasks assessed/performed Overall Cognitive Status: Within Functional Limits for tasks assessed                                 General Comments: Pt tolerating more therapy and less SOB this session        Exercises      Shoulder Instructions       General Comments Pt with much improvement noted in balance, activity tolerance and adl independence.    Pertinent Vitals/ Pain       Pain Assessment Pain Assessment: No/denies pain  Home Living                                          Prior Functioning/Environment              Frequency  Min 2X/week        Progress Toward Goals  OT Goals(current goals can now be found in the care plan section)  Progress towards OT goals: Progressing toward goals  Acute Rehab OT Goals Patient Stated Goal: to go home OT Goal Formulation: With patient Time For Goal Achievement: 09/22/21 Potential to Achieve Goals: Good ADL Goals Additional ADL Goal #1: Pt will gather clothing with walker and dress self with 2 rest breaks and no physical assist. Additional ADL Goal #2: Pt wil walk to bathroom and complete all toileting and wash hands with distant supervision. Additional ADL Goal #3: pt will state two things she can do at home to conserve energy throughout the day without cues.  Plan Discharge plan remains appropriate    Co-evaluation                 AM-PAC OT "6 Clicks" Daily Activity     Outcome Measure   Help from another person eating meals?: None Help from another person taking care of personal grooming?: None Help from another person toileting, which includes using toliet, bedpan, or urinal?: None Help from another person bathing  (including washing, rinsing, drying)?: A Little Help from another person to put on and taking off regular upper body clothing?: None Help from another person to put on and taking off regular lower body clothing?: A Little 6 Click Score: 22    End of Session Equipment Utilized During Treatment: Rolling walker (2 wheels)  OT Visit Diagnosis: Unsteadiness on feet (R26.81)   Activity Tolerance Patient tolerated treatment well   Patient Left in bed;with bed alarm set;with call bell/phone within reach   Nurse Communication Mobility status        Time: 1791-5056 OT Time Calculation (min): 25 min  Charges: OT General Charges $OT Visit: 1 Visit OT Treatments $Self Care/Home Management : 23-37 mins   Glenford Peers 09/13/2021, 11:46 AM

## 2021-09-13 NOTE — Care Management Important Message (Signed)
Important Message  Patient Details  Name: [name] MRN: [id number] Date of Birth: Dec 20, 1940   Medicare Important Message Given:  Yes     Shelda Altes 09/13/2021, 9:10 AM

## 2021-09-13 NOTE — Plan of Care (Signed)
Goal met 

## 2021-09-14 ENCOUNTER — Other Ambulatory Visit (HOSPITAL_COMMUNITY): Payer: Self-pay

## 2021-09-14 DIAGNOSIS — I5043 Acute on chronic combined systolic (congestive) and diastolic (congestive) heart failure: Secondary | ICD-10-CM | POA: Diagnosis not present

## 2021-09-14 DIAGNOSIS — I25118 Atherosclerotic heart disease of native coronary artery with other forms of angina pectoris: Secondary | ICD-10-CM | POA: Diagnosis not present

## 2021-09-14 DIAGNOSIS — U071 COVID-19: Secondary | ICD-10-CM | POA: Diagnosis not present

## 2021-09-14 DIAGNOSIS — N179 Acute kidney failure, unspecified: Secondary | ICD-10-CM | POA: Diagnosis not present

## 2021-09-14 LAB — BASIC METABOLIC PANEL
Anion gap: 10 (ref 5–15)
BUN: 73 mg/dL — ABNORMAL HIGH (ref 8–23)
CO2: 22 mmol/L (ref 22–32)
Calcium: 9.1 mg/dL (ref 8.9–10.3)
Chloride: 107 mmol/L (ref 98–111)
Creatinine, Ser: 4.17 mg/dL — ABNORMAL HIGH (ref 0.44–1.00)
GFR, Estimated: 10 mL/min — ABNORMAL LOW (ref >60)
Glucose, Bld: 86 mg/dL (ref 70–99)
Potassium: 4.5 mmol/L (ref 3.5–5.1)
Sodium: 139 mmol/L (ref 135–145)

## 2021-09-14 LAB — GLUCOSE, CAPILLARY: Glucose-Capillary: 71 mg/dL (ref 70–99)

## 2021-09-14 MED ORDER — INSULIN GLARGINE 100 UNIT/ML ~~LOC~~ SOLN: 6.0000 [IU] | Freq: Every day | SUBCUTANEOUS | Status: DC

## 2021-09-14 MED ORDER — GLUCERNA SHAKE PO LIQD
237.0000 mL | Freq: Three times a day (TID) | ORAL | 0 refills | Status: AC
  Filled 2021-09-14: qty 21330, 30d supply, fill #0

## 2021-09-14 MED ORDER — TORSEMIDE 20 MG PO TABS
80.0000 mg | ORAL_TABLET | Freq: Every day | ORAL | 0 refills | Status: DC
Start: 2021-09-14 — End: 2022-01-24
  Filled 2021-09-14: qty 120, 30d supply, fill #0

## 2021-09-14 MED ORDER — ADULT MULTIVITAMIN W/MINERALS CH
1.0000 | ORAL_TABLET | Freq: Every day | ORAL | 0 refills | Status: AC
  Filled 2021-09-14: qty 30, 30d supply, fill #0

## 2021-09-14 NOTE — TOC Transition Note (Signed)
Transition of Care [facility]) - CM/SW Discharge Note   Patient Details  Name: [name] MRN: [id number] Date of Birth: 04/13/1940  [facility] CM/SW Contact:  Zenon Mayo, RN Phone Number: 09/14/2021, 11:13 AM   Clinical Narrative:    Patient is for dc today, NCM notified Anderson Malta with [facility] regarding dc today.     Final next level of care: [place] Barriers to Discharge: No Barriers Identified   Patient Goals and CMS Choice Patient states their goals for this hospitalization and ongoing recovery are:: return home with [facility], The CMS Medicare.gov Compare Post Acute Care list provided to:: Patient Choice offered to / list presented to : Patient  Discharge Placement                       Discharge Plan and Services In-house Referral: NA Discharge Planning Services: CM Consult Post Acute Care Choice: Home Health, Resumption of Svcs/PTA Provider            DME Agency: NA       HH Arranged: RN, PT, OT HH Agency: [facility] Date [place]: 09/09/21 Time [place]: [id number] Representative spoke with at [place]: [place] ([place]) Interventions     Readmission Risk Interventions    09/09/2021   10:21 AM  Readmission Risk Prevention Plan  Transportation Screening Complete  PCP or Specialist Appt within 5-7 Days Complete  Home Care Screening Complete  Medication Review (RN CM) Complete

## 2021-09-14 NOTE — Discharge Summary (Addendum)
Physician Discharge Summary   Patient: [name] MRN: [id number] DOB: 1941-01-05  Admit date:     09/07/2021  Discharge date: 09/14/21  Discharge Physician: Jimmy Picket Davielle Lingelbach   PCP: Audley Hose, MD   Recommendations at discharge:    Patient will need follow up renal function this week Dose of torsemide has been increased to 80 mg daily Follow up as outpatient on renal cysts Follow up with nephrology as scheduled.   Discharge Diagnoses: Principal Problem:   Acute on chronic combined systolic and diastolic CHF (congestive heart failure) (HCC) Active Problems:   COVID-19 virus infection   Acute kidney injury superimposed on chronic kidney disease (HCC)   Coronary artery disease of native artery of native heart with stable angina pectoris (HCC)   DM (diabetes mellitus), type 2 with renal complications ([place])   Essential hypertension  Resolved Problems:   * No resolved hospital problems. [facility] Course: [name] as admitted to the hospital with the working diagnosis of decompensated heart failure, in the setting of COVID 19 infection.   81 yo female with the past medical history of hypertension, coronary artery disease, CKD stage IV and T2DM who presented with dyspnea. Reported progressive dyspnea and lower extremity edema. Recent upper respiratory tract infection. On her initial physical examination her blood pressure was 145/65, HR 58, RR 18 and 02 saturation 93%, patient was placed on Bipap due to respiratory distress. Lungs with no wheezing or rhonchi, heart with S1 and S2 present and rhythmic, abdomen not distended and trace lower extremity edema.   Na 140, K 4,0 Cl 108, bicarbonate 21, glucose 180, bun 63 cr 3,4  High sensitive troponin 35 and 44  Wbc 9,7 hgb 10,9 and plt 307  SARS covid 19 positive   Chest radiograph with cardiomegaly, cephalization of the vasculature and bilateral hilar vascular congestion, right pleural effusion, small to moderate.    EKG 58 bpm, normal axis, normal intervals, sinus rhythm with no significant ST segment or T wave changes.   Patient was placed on IV furosemide for diuresis.  Paxlovid for COIVD 19 infection for 5 days.    Patient has responded to to diuresis with furosemide, improvement in volume status and oxygenation.  Patient with AKI on CKD that prolonged her hospitalization.  Patient will follow up with primary care and nephrology as outpatient.     Assessment and Plan: * Acute on chronic combined systolic and diastolic CHF (congestive heart failure) ([place]) 04/2021 echocardiogram with preserved LV systolic function EF 60 to 65%.  RV systolic function is preserved. SP aortic valve replacement with no stenosis.  Patient was placed on IV furosemide for diuresis, negative fluid balance was achieved, - 5,314 ml, with significant improvement in her symptoms.  At the time of her discharge she will continue with  hydralazine and isosorbide for after load reduction.  Labetalol and amlodipine for blood pressure control.  Resume diuresis with torsemide at increased dose of 80 mg daily.   No RAS inhibition due to reduced GFR.   Acute hypoxemic respiratory failure due to acute cardiogenic pulmonary edema.  Respiratory failure has resolved by the time of her discharge.  Oxymetry is 97% on room air.   COVID-19 virus infection Respiratory failure likely more due to pulmonary edema, Patient has high risk for complications due to comorbid medical conditions.   Patient has completed 5 doses of Paxlovid (renally dose adjusted).   Acute kidney injury superimposed on chronic kidney disease (HCC) CKD stage 4, hypokalemia.  Negative fluid balance was achieved with improvement in her symptoms.  Her serum cr peaked at 4,37, at the time of her discharge is improving down to 4,17.  Her serum K is 4,5 and serum bicarbonate at 22.   Her baseline serum cr has been 3 to 4, in June last year she was up to 5.    Abdominal MRI 2022 with benign cystic lesions in both kidneys. Left kidney lower pole cyst 3.2 x 2.4 cm on image 30.7.  Recommendation for follow up MRI in 1 year.  Because AKI on CKD renal US was performed during her hospitalization, which showed no hydronephrosis, complex cystic lesion in the inferior pole of the left kidney measuring 3,8 cm. Bilateral renal cysts, echogenic kidneys.   Anemia of chronic renal disease, hgb has been stable at 10.6   Follow up renal function as outpatient this week and follow up renal cysts as outpatient.   Coronary artery disease of native artery of native heart with stable angina pectoris (HCC) No chest pain Elevated troponin due to heart failure exacerbation.   DM (diabetes mellitus), type 2 with renal complications (HCC) Continue insulin sliding scale for glucose cover and monitoring. Basal insulin 6 units daily.  Her glucose remained stable during her hospitalization  Essential hypertension Continue blood pressure control labetalol, amlodipine. Isosorbide and hydralazine.          Consultants: none  Procedures performed: none   Disposition: Home Diet recommendation:  Discharge Diet Orders (From admission, onward)     Start     Ordered   09/14/21 0000  Diet - low sodium heart healthy        09/14/21 1110           Cardiac and Carb modified diet DISCHARGE MEDICATION: Allergies as of 09/14/2021   No Known Allergies      Medication List     STOP taking these medications    senna-docusate 8.6-50 MG tablet Commonly known as: Senokot-S       TAKE these medications    acetaminophen 325 MG tablet Commonly known as: TYLENOL Take 650 mg by mouth every 6 (six) hours as needed for moderate pain or headache.   amLODipine 10 MG tablet Commonly known as: NORVASC TAKE 1 TABLET BY MOUTH EVERY DAY   aspirin EC 81 MG tablet Take 81 mg by mouth daily.   brimonidine 0.2 % ophthalmic solution Commonly known as: ALPHAGAN Place  1 drop into the left eye daily.   feeding supplement (GLUCERNA SHAKE) Liqd Take 237 mLs by mouth 3 (three) times daily between meals.   ferrous sulfate 325 (65 FE) MG tablet Take 325 mg by mouth daily with breakfast.   folic acid 1 MG tablet Commonly known as: FOLVITE Take 1 mg by mouth daily.   HumaLOG KwikPen 100 UNIT/ML KwikPen Generic drug: insulin lispro Inject 1-5 Units into the skin See admin instructions. Inject 1-5 units 3 times daily with meals and at bedtime. Dose depends on what pt is eating.   hydrALAZINE 100 MG tablet Commonly known as: APRESOLINE Take 100 mg by mouth 3 (three) times daily. What changed: Another medication with the same name was removed. Continue taking this medication, and follow the directions you see here.   insulin glargine 100 UNIT/ML injection Commonly known as: LANTUS Inject 0.06 mLs (6 Units total) into the skin at bedtime.   isosorbide mononitrate 120 MG 24 hr tablet Commonly known as: IMDUR Take 2 tablets (240 mg total) by mouth daily.  labetalol 200 MG tablet Commonly known as: NORMODYNE Take 1 tablet (200 mg total) by mouth 2 (two) times daily.   Lokelma 10 g Pack packet Generic drug: sodium zirconium cyclosilicate Take 10 g by mouth daily as needed (high potassium).   multivitamin with minerals Tabs tablet Take 1 tablet by mouth daily. Start taking on: September 15, 4625   Repatha SureClick 035 MG/ML Soaj Generic drug: Evolocumab INJECT 1 ML INTO THE SKIN EVERY 14 (FOURTEEN) DAYS.   torsemide 20 MG tablet Commonly known as: DEMADEX Take 4 tablets (80 mg total) by mouth daily. What changed: how much to take   venlafaxine XR 150 MG 24 hr capsule Commonly known as: EFFEXOR-XR Take 1 capsule (150 mg total) by mouth daily.        Follow-up Information     Audley Hose, MD. Go on 09/17/2021.   Specialty: Internal Medicine Why: @11 :30 Contact information: [address] [place]  [address] [phone]                Discharge Exam: Filed Weights   09/12/21 0628 09/13/21 0449 09/14/21 0500  Weight: 73.1 kg 73.4 kg 73.5 kg   BP (!) 147/56 (BP Location: Left Arm)   Pulse (!) 57   Temp 98.1 F (36.7 C) (Oral)   Resp 14   Ht 5' 2.5" (1.588 m)   Wt 73.5 kg   SpO2 100%   BMI 29.18 kg/m   Patient is feeling better, dyspnea and edema have improved.  Neurology awake and alert ENT with mild pallor Cardiovascular with S1 and S2 present and rhythmic with no gallops Respiratory with no rales or wheezing Abdomen with no distention  Trace lower extremity edema   Condition at discharge: stable  The results of significant diagnostics from this hospitalization (including imaging, microbiology, ancillary and laboratory) are listed below for reference.   Imaging Studies: US RENAL  Result Date: 09/13/2021 CLINICAL DATA:  Acute on chronic renal failure. EXAM: RENAL / URINARY TRACT ULTRASOUND COMPLETE COMPARISON:  MRI abdomen 01/26/2020 FINDINGS: Right Kidney: Renal measurements: 9.6 x 4.2 x 4.9 = volume: 102 mL. No hydronephrosis. Increased echogenicity. Multiple cysts are identified. The largest is in the lower pole measuring 2.3 x 2.2 x 1.6 cm. Left Kidney: Renal measurements: 9.0 x 5.5 x 5.0 cm = volume: 120 mL. No hydronephrosis. Increased echogenicity. There is a heterogeneous complex cystic lesion in the inferior pole the left kidney measuring 3.6 x 3 point 0 x 3.8 cm. There is a cyst in the mid left kidney measuring 3.5 x 3.6 x 3.8 cm (this measured up to 3.2 cm on prior MRI). Bladder: Appears normal for degree of bladder distention. Other: None. IMPRESSION: 1. Complex cystic lesion in the inferior pole the left kidney measuring 3.8 cm. This has increased in size compared to prior MRI. A follow-up MRI is recommended for further evaluation. 2. Additional bilateral renal cysts. 3. Echogenic kidneys likely related to medical renal disease. Electronically Signed   By:  Ronney Asters [name]   On: 09/13/2021 18:23   NM Pulmonary Perfusion  Result Date: 09/08/2021 CLINICAL DATA:  Pulmonary embolism suspected.  Positive D-dimer. EXAM: NUCLEAR MEDICINE PERFUSION LUNG SCAN TECHNIQUE: Perfusion images were obtained in multiple projections after intravenous injection of radiopharmaceutical. Ventilation scans intentionally deferred if perfusion scan and chest x-ray adequate for interpretation during COVID 19 epidemic. RADIOPHARMACEUTICALS:  4.0 mCi Tc-70m MAA IV COMPARISON:  Chest imaging studies yesterday. FINDINGS: No perfusion defect to suggest pulmonary embolism.  Fissural sign on the right consistent with the presence of pleural fluid. IMPRESSION: No perfusion defect to suggest pulmonary embolism. Fissural sign on the right consistent with the presence of right pleural fluid. Electronically Signed   By: Nelson Chimes [name]   On: 09/08/2021 14:58   CT Chest Wo Contrast  Result Date: 09/07/2021 CLINICAL DATA:  Respiratory illness EXAM: CT CHEST WITHOUT CONTRAST TECHNIQUE: Multidetector CT imaging of the chest was performed following the standard protocol without IV contrast. RADIATION DOSE REDUCTION: This exam was performed according to the departmental dose-optimization program which includes automated exposure control, adjustment of the mA and/or kV according to patient size and/or use of iterative reconstruction technique. COMPARISON:  Chest CT dated February 04, 2021 FINDINGS: Cardiovascular: Mild cardiomegaly. No pericardial effusion. Prior transcatheter aortic valve replacement. Unchanged pericardial calcifications. Normal caliber thoracic aorta with moderate calcified plaque. Severe three-vessel coronary artery calcifications. Mediastinum/Nodes: Patulous esophagus. Thyroid is unremarkable. No pathologically enlarged lymph nodes seen in the chest. Lungs/Pleura: Central airways are patent. Septal thickening of the bilateral lower lungs. Small loculated right pleural effusion,  increased in size when compared with prior exam. New trace left pleural effusion. No evidence of pleural thickening. Bibasilar atelectasis. No consolidation, pleural effusion or pneumothorax. Upper Abdomen: Stable left adrenal gland adenoma. No acute abnormality. Musculoskeletal: Loop recorder device. IMPRESSION: 1. Small loculated right pleural effusion, increased in size when compared with prior CT exam. New trace left pleural effusion. 2. Mild pulmonary edema and bibasilar atelectasis. 3.  Aortic Atherosclerosis (ICD10-I70.0). Electronically Signed   By: Yetta Glassman [name]   On: 09/07/2021 17:11   DG Chest 1 View  Result Date: 09/07/2021 CLINICAL DATA:  Shortness of breath. EXAM: CHEST  1 VIEW COMPARISON:  September 06, 2021. FINDINGS: Stable cardiomegaly. Status post transcatheter aortic valve repair. Stable right-sided pleural effusion is noted with associated right basilar atelectasis or infiltrate. Bony thorax is unremarkable. IMPRESSION: Stable right-sided pleural effusion with associated right basilar atelectasis or infiltrate. Electronically Signed   By: Marijo Conception [name]   On: 09/07/2021 13:01   DG Chest 2 View  Result Date: 09/06/2021 CLINICAL DATA:  Heart failure, unspecified chronicity. Shortness of breath and edema. EXAM: CHEST - 2 VIEW COMPARISON:  Radiographs 12/26/2020 and 10/04/2020.  CT 02/04/2021. FINDINGS: Stable cardiomegaly and anterior pericardial calcification post TAVR procedure. A small right pleural effusion has enlarged compared with the most recent prior radiographs with extension into the fissures. There is no edema, confluent airspace opacity or pneumothorax. The bones appear unremarkable. Loop recorder is present in the anterior chest wall. IMPRESSION: Chronic cardiomegaly with interval increased right pleural effusion extending into the fissures. No edema. Electronically Signed   By: Richardean Sale [name]   On: 09/06/2021 13:52   VAS [name] LOWER EXTREMITY VENOUS  (DVT)  Result Date: 09/03/2021  Lower Venous DVT Study Patient Name:  AVYONNA WAGONER  Date of Exam:   09/03/2021 Medical Rec #: [id number]      Accession #:    [id number] Date of Birth: 31-May-1940      Patient Gender: F Patient Age:   39 years Exam Location:  [facility] Procedure:      VAS [name] LOWER EXTREMITY VENOUS (DVT) Referring Phys: Lenard Lance --------------------------------------------------------------------------------  Indications: Patient reports increase bilateral lower extremity swelling x 3 days. She started having some shortness of breath and just feeling overall weak in the past 2 days.  Risk Factors: None identified. Comparison Study: NA Performing Technologist: Sharlett Iles RVT  Examination Guidelines: A complete  evaluation includes B-mode imaging, spectral Doppler, color Doppler, and power Doppler as needed of all accessible portions of each vessel. Bilateral testing is considered an integral part of a complete examination. Limited examinations for reoccurring indications may be performed as noted. The reflux portion of the exam is performed with the patient in reverse Trendelenburg.  +---------+---------------+---------+-----------+----------+--------------+ RIGHT    CompressibilityPhasicitySpontaneityPropertiesThrombus Aging +---------+---------------+---------+-----------+----------+--------------+ CFV      Full                    Yes                                 +---------+---------------+---------+-----------+----------+--------------+ SFJ      Full                    Yes                                 +---------+---------------+---------+-----------+----------+--------------+ FV Prox  Full                    Yes                                 +---------+---------------+---------+-----------+----------+--------------+ FV Mid   Full                                                         +---------+---------------+---------+-----------+----------+--------------+ FV DistalFull                    Yes                                 +---------+---------------+---------+-----------+----------+--------------+ PFV      Full                    Yes                                 +---------+---------------+---------+-----------+----------+--------------+ POP      Full                    Yes                                 +---------+---------------+---------+-----------+----------+--------------+ PTV      Full                                                        +---------+---------------+---------+-----------+----------+--------------+ PERO     Full                                                        +---------+---------------+---------+-----------+----------+--------------+  Gastroc  Full                                                        +---------+---------------+---------+-----------+----------+--------------+ GSV      Full                    Yes                                 +---------+---------------+---------+-----------+----------+--------------+   Right Technical Findings: Pulsatile venous flow is evident throughout. Limited visualization of the PTVs and peroneal veins due to increase lower leg swelling and tightness. Specifically, the tibioperoneal confluence and popliteal vein are without thrombus.  +---------+---------------+---------+-----------+----------+--------------+ LEFT     CompressibilityPhasicitySpontaneityPropertiesThrombus Aging +---------+---------------+---------+-----------+----------+--------------+ CFV      Full                    Yes                                 +---------+---------------+---------+-----------+----------+--------------+ SFJ      Full                    Yes                                 +---------+---------------+---------+-----------+----------+--------------+ FV Prox  Full                     Yes                                 +---------+---------------+---------+-----------+----------+--------------+ FV Mid   Full                                                        +---------+---------------+---------+-----------+----------+--------------+ FV DistalFull                    Yes                                 +---------+---------------+---------+-----------+----------+--------------+ PFV      Full                    Yes                                 +---------+---------------+---------+-----------+----------+--------------+ POP      Full                    Yes                                 +---------+---------------+---------+-----------+----------+--------------+ PTV      Full                                                        +---------+---------------+---------+-----------+----------+--------------+  PERO     Full                                                        +---------+---------------+---------+-----------+----------+--------------+ Gastroc  Full                                                        +---------+---------------+---------+-----------+----------+--------------+ GSV      Full                    Yes                                 +---------+---------------+---------+-----------+----------+--------------+   Left Technical Findings: Pulsatile venous flow is evident throughout. Limited visualization of the PTVs and peroneal veins due to increase lower leg swelling and tightness. Specifically, the tibioperoneal confluence and popliteal vein are without thrombus.   Summary: BILATERAL: - No evidence of deep vein thrombosis seen in the lower extremities, bilaterally. - No evidence of superficial venous thrombosis in the lower extremities, bilaterally. -No evidence of popliteal cyst, bilaterally. - Pulsatile venous flow, bilaterally. Pulsatile lower limb venous Doppler waveform correlates well with  increase right atrium pressure; right-sided heart failure.   *See table(s) above for measurements and observations. Electronically signed by Ida Rogue MD on 09/03/2021 at 9:45:20 PM.    Final     Microbiology: Results for orders placed or performed during the hospital encounter of 09/07/21  SARS Coronavirus 2 by RT PCR (hospital order, performed in [facility] hospital lab) *cepheid single result test* Anterior Nasal Swab     Status: Abnormal   Collection Time: 09/07/21 12:17 PM   Specimen: Anterior Nasal Swab  Result Value Ref Range Status   SARS Coronavirus 2 by RT PCR POSITIVE (A) NEGATIVE Final    Comment: (NOTE) SARS-CoV-2 target nucleic acids are DETECTED  SARS-CoV-2 RNA is generally detectable in upper respiratory specimens  during the acute phase of infection.  Positive results are indicative  of the presence of the identified virus, but do not rule out bacterial infection or co-infection with other pathogens not detected by the test.  Clinical correlation with patient history and  other diagnostic information is necessary to determine patient infection status.  The expected result is negative.  Fact Sheet for Patients:   https://www.patel.info/   Fact Sheet for Healthcare Providers:   https://hall.com/    This test is not yet approved or cleared by the [place] FDA and  has been authorized for detection and/or diagnosis of SARS-CoV-2 by FDA under an Emergency Use Authorization (EUA).  This EUA will remain in effect (meaning this test can be used) for the duration of  the COVID-19 declaration under Section 564(b)(1)  of the Act, 21 U.S.C. section 360-bbb-3(b)(1), unless the authorization is terminated or revoked sooner.   Performed at [facility], [place] [address], [place], [place] [address]     Labs: CBC: Recent Labs  Lab 09/07/21 1225 09/08/21 0243 09/08/21 0737  WBC 7.7 9.7 9.0  NEUTROABS 5.6 7.4  --    HGB 10.1* 10.9* 10.6*  HCT 30.5* 32.9* 32.1*  MCV 86.9 86.8 87.0  PLT 295 307 638   Basic Metabolic Panel: Recent Labs  Lab 09/07/21 1751 09/07/21 2343 09/08/21 0243 09/08/21 0737 09/09/21 0502 09/10/21 0548 09/11/21 0658 09/12/21 0343 09/13/21 0736 09/14/21 0401  NA  --  140 141 141   < > 137 138 138 139 139  K  --  4.0 4.6 3.6   < > 3.8 3.4* 4.1 4.3 4.5  CL  --  108 107 109   < > 103 106 103 104 107  CO2  --  21* 23 23   < > 23 22 23 24 22   GLUCOSE  --  180* 166* 111*   < > 156* 92 99 111* 86  BUN  --  63* 64* 61*   < > 64* 67* 72* 70* 73*  CREATININE  --  3.40* 3.46* 3.35*   < > 3.70* 3.95* 4.25* 4.35* 4.17*  CALCIUM  --  8.7* 9.2 9.1   < > 8.7* 8.6* 8.7* 9.4 9.1  MG 2.0  --  1.9 2.0  --   --   --   --   --   --   PHOS  --  3.8 4.3 3.9  --   --   --  3.4  --   --    < > = values in this interval not displayed.   Liver Function Tests: Recent Labs  Lab 09/07/21 2343 09/08/21 0243 09/08/21 0737 09/12/21 0343  AST 16 18 16   --   ALT 11 11 11   --   ALKPHOS 68 77 78  --   BILITOT 0.5 0.7 0.5  --   PROT 5.8* 6.3* 6.2*  --   ALBUMIN 2.9* 3.1* 3.0* 2.8*   CBG: Recent Labs  Lab 09/13/21 0605 09/13/21 1156 09/13/21 1610 09/13/21 2007 09/14/21 0555  GLUCAP 89 170* 122* 163* 71    Discharge time spent: greater than 30 minutes.  Signed: Tawni Millers, MD [facility] 09/14/2021

## 2021-10-07 ENCOUNTER — Encounter: Payer: Self-pay | Admitting: Cardiology

## 2021-10-07 ENCOUNTER — Ambulatory Visit: Payer: Medicare PPO | Admitting: Cardiology

## 2021-10-07 VITALS — BP 151/55 | HR 51 | Temp 97.7°F | Resp 16 | Ht 62.0 in | Wt 162.0 lb

## 2021-10-07 DIAGNOSIS — I25118 Atherosclerotic heart disease of native coronary artery with other forms of angina pectoris: Secondary | ICD-10-CM

## 2021-10-07 DIAGNOSIS — I5032 Chronic diastolic (congestive) heart failure: Secondary | ICD-10-CM

## 2021-10-07 DIAGNOSIS — N185 Chronic kidney disease, stage 5: Secondary | ICD-10-CM

## 2021-10-07 DIAGNOSIS — Z952 Presence of prosthetic heart valve: Secondary | ICD-10-CM

## 2021-10-07 NOTE — Progress Notes (Signed)
Subjective:   [name], female    DOB: [birth year], 81 y.o.   MRN: [id number]  HPI   Chief Complaint  Patient presents with   Coronary Artery Disease   Congestive Heart Failure    81 y.o. African American female with hypertension, hyperlipidemia, diabetes with CKD stage V, CAD, h/o strokes (2015, 2020), s/p TAVR for severe AS (11/2018)  Patient was hospitalized at [facility] in 08/2021 with acute decompensated heart failure and COVID-19 infection.  She was diuresed -5.3 L.  At the time of discharge, she was continued on hydralazine and isosorbide along with labetalol and amlodipine.  Torsemide was increased to 80 mg daily.  Patient is doing well since hospital discharge.  She denies any chest pain, has class II dyspnea symptoms.  Leg edema has improved.  She saw her nephrologist Dr. Hollie Salk yesterday.  Creatinine is improving according to her.   Current Outpatient Medications:    acetaminophen (TYLENOL) 325 MG tablet, Take 650 mg by mouth every 6 (six) hours as needed for moderate pain or headache. , Disp: , Rfl:    amLODipine (NORVASC) 10 MG tablet, TAKE 1 TABLET BY MOUTH EVERY DAY (Patient taking differently: Take 10 mg by mouth daily.), Disp: 90 tablet, Rfl: 3   aspirin EC 81 MG tablet, Take 81 mg by mouth daily., Disp: , Rfl:    brimonidine (ALPHAGAN) 0.2 % ophthalmic solution, Place 1 drop into the left eye daily., Disp: , Rfl:    feeding supplement, GLUCERNA SHAKE, (GLUCERNA SHAKE) LIQD, Take 237 mLs by mouth 3 (three) times daily between meals., Disp: 21330 mL, Rfl: 0   ferrous sulfate 325 (65 FE) MG tablet, Take 325 mg by mouth daily with breakfast., Disp: , Rfl:    folic acid (FOLVITE) 1 MG tablet, Take 1 mg by mouth daily., Disp: , Rfl:    HUMALOG KWIKPEN 100 UNIT/ML KwikPen, Inject 1-5 Units into the skin See admin instructions. Inject 1-5 units 3 times daily with meals and at bedtime. Dose depends on what pt is eating., Disp: , Rfl:    hydrALAZINE (APRESOLINE)  100 MG tablet, Take 100 mg by mouth 3 (three) times daily., Disp: , Rfl:    insulin glargine (LANTUS) 100 UNIT/ML injection, Inject 0.06 mLs (6 Units total) into the skin at bedtime., Disp: , Rfl:    isosorbide mononitrate (IMDUR) 120 MG 24 hr tablet, Take 2 tablets (240 mg total) by mouth daily., Disp: 60 tablet, Rfl: 5   labetalol (NORMODYNE) 200 MG tablet, Take 1 tablet (200 mg total) by mouth 2 (two) times daily., Disp: 180 tablet, Rfl: 3   Multiple Vitamin (MULTIVITAMIN WITH MINERALS) TABS tablet, Take 1 tablet by mouth daily., Disp: 30 tablet, Rfl: 0   REPATHA SURECLICK 409 MG/ML SOAJ, INJECT 1 ML INTO THE SKIN EVERY 14 (FOURTEEN) DAYS., Disp: 6 mL, Rfl: 3   sodium zirconium cyclosilicate (LOKELMA) 10 g PACK packet, Take 10 g by mouth daily as needed (high potassium)., Disp: , Rfl:    torsemide (DEMADEX) 20 MG tablet, Take 4 tablets (80 mg total) by mouth daily., Disp: 120 tablet, Rfl: 0   venlafaxine XR (EFFEXOR-XR) 150 MG 24 hr capsule, Take 1 capsule (150 mg total) by mouth daily., Disp: 30 capsule, Rfl: 0  Cardiovascular & other pertient studies:  EKG 07/14/2021: Sinus rhythm 55 bpm First degree A-V block  Poor R-wave progression Nonspecific T-abnormality  Personally reviewed and independently interpreted. Echocardiogram 07/05/2021:  1. Left ventricle: The left ventricular cavity size  is normal. Wall     thickness is normal. Systolic function is at the lower limits of normal     by visual assessment. The ejection fraction is 50% (+/-5%).  2. Right ventricle: The right ventricular cavity size is normal. Systolic     function is normal as estimated by visual and quantitative measures.  3. Left atrium: The left atrium is mildly dilated as determined by left     atrial volume index.  4. Mitral valve: There is trivial mitral stenosis. There is mild to moderate     mitral regurgitation directed eccentrically and toward the free wall. The     eccentric nature of the jet may  underestimate the regurgitation.  5. Aortic valve: There is a SAPIEN, 23 mm transcatheter valve ([facility]     11/13/2018) that is well seated in the aortic valve position. The mean     systolic gradient is 01.6PV Hg. The peak systolic gradient is 37.4MO Hg.     Dimensionless Index: 0.41.  6. Tricuspid valve: There is mild-moderate tricuspid regurgitation.  7. Pulmonary arteries: Pulmonary artery estimated peak systolic pressure:     70BE Hg. This is consistent with moderate pulmonary hypertension.  Prior study date: 01/16/2021. Compared to the previous echo report from  [facility] in [place], there is an increase in RV pressures  with nor moderate pulmonary hypertension. RVSP of 58 mmHg (previously noted  as 37 mmHg).   CT chest 07/05/2021: 1. Ill-defined masslike opacity with air bronchograms within the right upper lobe favoring an inflammatory/infectious process. However, follow-up is recommended after appropriate antibiotic therapy.  2. Cardiomegaly post TAVR with mild pulmonary vascular congestion.  3. A few scattered tiny pulmonary nodules measuring up to 4 mm likely reflecting a nonspecific inflammatory/infectious process. Attention on follow-up is recommended.  4. Small bilateral pleural effusions.    ---------------------------------------------------------------  RHC/LHC 10/2018: RA: 4 mmHg RV: 54/9 mmHg PA: 54/17 mmHg, mean PAP 34 mmhg. PCWP: 25 mmHg with tall V wave   CO: 5.2 L/min CI: 3 L/min/m2   LM: Normal LAD: Normal LCx:: Normal RCA: Ostial calcific at least 60% stenosis. Ostial RPDA 20% stenosis.    Right coronary artery was difficult to engage. I had to use multiple catheters, and was finally able to engage with [facility] catheter. there was dampnening of the pressure with catheter engagement. Ostium is at least 60% stenosed with moderate calcification. There was no signifciant improvement with IC NTG. I did not perform FFR in order to limit contrast use in  this patient with Cr of 3.0, not on dialysis.    Recommendation: Will discuss with structural valve clinic. If functional evaluation of RCA is felt necesary prior to TAVR/SAVR, would consider non-invasive stress test.   Personally reviewed and independently interpreted. Recent labs: 09/14/2021: Glucose 86, BUN/Cr 73/4.17. EGFR 10. Na/K 139/4.5. Albumin 3.0. Protein 6.2. Rest of the CMP normal H/H 10/32. MCV 87. Platelets 306 HbA1C 7.2% TSH 2.6 normal  07/07/2021: Glucose 220, BUN/Cr 52/3.52. EGFR 12. Na/K 135/3.5.  H/H 11/34. MCV 86. Platelets 220 HbA1C 7.1% Trop HS 46 BNP 1792  12/26/2020: Glucose 132, BUN/Cr 51/3.28. EGFR 14. Na/K 139/4.5. Rest of the CMP normal H/H 11/35. MCV 89. Platelets 258 Chol 171, TG 113, HDL 60, LDL 91 BNP 632 Trop HS 21, 25  08/13/2020: Glucose 135, BUN 45, creatinine 2.49, GFR 19, sodium 140, potassium 4.9   Review of Systems  Constitutional: Negative for malaise/fatigue and weight gain.  Cardiovascular:  Positive for dyspnea on exertion and  leg swelling. Negative for chest pain, claudication, near-syncope, orthopnea and paroxysmal nocturnal dyspnea.  Respiratory:  Negative for shortness of breath.   Hematologic/Lymphatic: Does not bruise/bleed easily.  Gastrointestinal:  Negative for melena.  Neurological:  Negative for dizziness and weakness.        Vitals:   10/07/21 1002  BP: (!) 151/55  Pulse: (!) 51  Resp: 16  Temp: 97.7 F (36.5 C)  SpO2: 97%    Body mass index is 29.63 kg/m. Filed Weights   10/07/21 1002  Weight: 162 lb (73.5 kg)     Objective:   Physical Exam Vitals and nursing note reviewed.  Constitutional:      General: She is not in acute distress.    Appearance: She is well-developed.  Cardiovascular:     Rate and Rhythm: Normal rate and regular rhythm.     Pulses: Intact distal pulses.     Heart sounds: S1 normal and S2 normal. Murmur heard.     Harsh midsystolic murmur is present with a grade of 1/6 at the  upper right sternal border radiating to the neck.     No gallop.  Pulmonary:     Effort: Pulmonary effort is normal.     Breath sounds: Normal breath sounds.  Musculoskeletal:     Right lower leg: Edema (Trace) present.     Left lower leg: Edema (Trace) present.  Neurological:     Mental Status: She is alert.       Assessment & Recommendations:    81 y.o. African American female with hypertension, hyperlipidemia, diabetes with CKD stage V, CAD, h/o strokes (2015, 2020), s/p TAVR for severe AS (11/2018)  HFpEF: Acute on chronic HFpEF resolved. Leg edema trace. On Torsemide 80 mg daily.   CAD: Known CAD with moderate ostial RCA disease on pre-TAVR cath in 2020.   Angina symptoms are fairly well controlled on current anti anginal therapy.  Continue Aspirin 81 mg daily. No changes made today to her antihypertensive and antianginal therapy.    S/P TAVR (transcatheter aortic valve replacement): Normal functioning bioprosthetic aortic valve (Echocardiogram 12/2019). Continue Aspirin 81 mg daily. Not functioning well on echocardiogram 12/2020. Diastolic dysfunction due to uncontrolled hypertension, advanced CKD.  Chronic kidney disease (CKD), stage V: F/u w/Dr/. Upton  F/u in 6 months    [employer], PA-C 10/07/2021, 8:45 AM Office: [phone]

## 2021-10-14 ENCOUNTER — Ambulatory Visit: Payer: Medicare PPO | Admitting: Cardiology

## 2021-11-02 ENCOUNTER — Other Ambulatory Visit: Payer: Self-pay | Admitting: Cardiology

## 2021-11-02 ENCOUNTER — Other Ambulatory Visit: Payer: Self-pay | Admitting: Student

## 2021-11-02 ENCOUNTER — Encounter (INDEPENDENT_AMBULATORY_CARE_PROVIDER_SITE_OTHER): Payer: Medicare PPO | Admitting: Ophthalmology

## 2021-11-02 DIAGNOSIS — I10 Essential (primary) hypertension: Secondary | ICD-10-CM

## 2021-11-22 ENCOUNTER — Emergency Department (HOSPITAL_BASED_OUTPATIENT_CLINIC_OR_DEPARTMENT_OTHER): Payer: Medicare PPO | Admitting: Radiology

## 2021-11-22 ENCOUNTER — Other Ambulatory Visit: Payer: Self-pay

## 2021-11-22 ENCOUNTER — Emergency Department (HOSPITAL_BASED_OUTPATIENT_CLINIC_OR_DEPARTMENT_OTHER)
Admission: EM | Admit: 2021-11-22 | Discharge: 2021-11-22 | Disposition: A | Discharge status: Home / Self Care | Payer: Medicare PPO | Attending: Emergency Medicine | Admitting: Emergency Medicine

## 2021-11-22 ENCOUNTER — Encounter (HOSPITAL_BASED_OUTPATIENT_CLINIC_OR_DEPARTMENT_OTHER): Payer: Self-pay

## 2021-11-22 DIAGNOSIS — I509 Heart failure, unspecified: Secondary | ICD-10-CM | POA: Insufficient documentation

## 2021-11-22 DIAGNOSIS — R0602 Shortness of breath: Secondary | ICD-10-CM | POA: Diagnosis present

## 2021-11-22 LAB — CBC
HCT: 29.8 % — ABNORMAL LOW (ref 36.0–46.0)
Hemoglobin: 9.6 g/dL — ABNORMAL LOW (ref 12.0–15.0)
MCH: 28.1 pg (ref 26.0–34.0)
MCHC: 32.2 g/dL (ref 30.0–36.0)
MCV: 87.1 fL (ref 80.0–100.0)
Platelets: 279 10*3/uL (ref 150–400)
RBC: 3.42 MIL/uL — ABNORMAL LOW (ref 3.87–5.11)
RDW: 15.4 % (ref 11.5–15.5)
WBC: 7.2 10*3/uL (ref 4.0–10.5)
nRBC: 0 % (ref 0.0–0.2)

## 2021-11-22 LAB — BASIC METABOLIC PANEL
Anion gap: 12 (ref 5–15)
BUN: 69 mg/dL — ABNORMAL HIGH (ref 8–23)
CO2: 22 mmol/L (ref 22–32)
Calcium: 9.2 mg/dL (ref 8.9–10.3)
Chloride: 104 mmol/L (ref 98–111)
Creatinine, Ser: 3.58 mg/dL — ABNORMAL HIGH (ref 0.44–1.00)
GFR, Estimated: 12 mL/min — ABNORMAL LOW (ref >60)
Glucose, Bld: 273 mg/dL — ABNORMAL HIGH (ref 70–99)
Potassium: 3.9 mmol/L (ref 3.5–5.1)
Sodium: 138 mmol/L (ref 135–145)

## 2021-11-22 MED ORDER — ALBUTEROL SULFATE HFA 108 (90 BASE) MCG/ACT IN AERS: 2.0000 | INHALATION_SPRAY | RESPIRATORY_TRACT | Status: DC | PRN

## 2021-11-22 NOTE — ED Notes (Signed)
Pt agreeable with d/c plan as discussed by provider- this nurse has verbally reinforced d/c instructions and provided pt with written copy - pt acknowledges verbal understanding and denies any addl questions concerns needs -- pt ambulatory at d/c with rolling walker escorted by 2 family members

## 2021-11-22 NOTE — ED Triage Notes (Signed)
Pt reports SHOB and BIL LE swelling. Pt takes torsemide and PCP sent for further workup.

## 2021-11-22 NOTE — ED Provider Notes (Signed)
[facility] EMERGENCY DEPT Provider Note   CSN: [id number] Arrival date & time: 11/22/21  1820     History Chief Complaint  Patient presents with   Shortness of Breath   Leg Swelling    HPI [name] is a 81 y.o. female presenting for leg swelling and shortness of breath.  She has a history of heart failure.  She ran out of her torsemide for approximately 4 days.  She called her PCP this morning because she was short of breath with more dyspnea on exertion.  Fortunately she took her brothers torsemide at approximately noon 40 mg p.o.  She had significant fluid output throughout the day today and feels significantly clinically improved.  She denies fevers or chills, nausea vomiting, syncope shortness of breath. Called her PCP they recommend she come to the emergency department for further care and management..   Patient's recorded medical, surgical, social, medication list and allergies were reviewed in the Snapshot window as part of the initial history.   Review of Systems   Review of Systems  Constitutional:  Negative for chills and fever.  HENT:  Negative for ear pain and sore throat.   Eyes:  Negative for pain and visual disturbance.  Respiratory:  Positive for shortness of breath. Negative for cough.   Cardiovascular:  Positive for leg swelling. Negative for chest pain and palpitations.  Gastrointestinal:  Negative for abdominal pain and vomiting.  Genitourinary:  Negative for dysuria and hematuria.  Musculoskeletal:  Negative for arthralgias and back pain.  Skin:  Negative for color change and rash.  Neurological:  Negative for seizures and syncope.  All other systems reviewed and are negative.   Physical Exam Updated Vital Signs BP (!) 184/55   Pulse 61   Temp 97.9 F (36.6 C)   Resp 20   Ht 5' 2.5" (1.588 m)   SpO2 99%   BMI 29.16 kg/m  Physical Exam Vitals and nursing note reviewed.  Constitutional:      General: She is not in acute  distress.    Appearance: She is well-developed.  HENT:     Head: Normocephalic and atraumatic.  Eyes:     Conjunctiva/sclera: Conjunctivae normal.  Cardiovascular:     Rate and Rhythm: Normal rate and regular rhythm.     Heart sounds: No murmur heard. Pulmonary:     Effort: Pulmonary effort is normal. No respiratory distress.     Breath sounds: Normal breath sounds.  Abdominal:     Palpations: Abdomen is soft.     Tenderness: There is no abdominal tenderness.  Musculoskeletal:        General: No swelling.     Cervical back: Neck supple.     Right lower leg: Tenderness present. Edema present.     Left lower leg: Tenderness present. Edema present.  Skin:    General: Skin is warm and dry.     Capillary Refill: Capillary refill takes less than 2 seconds.  Neurological:     Mental Status: She is alert.  Psychiatric:        Mood and Affect: Mood normal.      ED Course/ Medical Decision Making/ A&P    Procedures Procedures   Medications Ordered in ED Medications  albuterol (VENTOLIN HFA) 108 (90 Base) MCG/ACT inhaler 2 puff (has no administration in time range)    Medical Decision Making:    [name] is a 81 y.o. female who presented to the ED today with shortness of breath  and leg swelling detailed above.     Patient's presentation is complicated by their history of CKD, heart failure.  Patient placed on continuous vitals and telemetry monitoring while in ED which was reviewed periodically.   Complete initial physical exam performed, notably the patient  was hemodynamically stable in no acute distress.  She satting 99% on room air at this time, no tachypnea.      Reviewed and confirmed nursing documentation for past medical history, family history, social history.    Initial Assessment:   With the patient's presentation of shortness of breath and leg swelling, most likely diagnosis is developing heart failure exacerbation. Other diagnoses were considered including  (but not limited to) ACS, pulmonary embolism, Bilateral DVTs. These are considered less likely due to history of present illness and physical exam findings.   This is most consistent with an acute life/limb threatening illness complicated by underlying chronic conditions.  Initial Plan:  Screening labs including CBC and Metabolic panel to evaluate for infectious or metabolic etiology of disease.  CXR to evaluate for structural/infectious intrathoracic pathology.  EKG to evaluate for cardiac pathology. Objective evaluation as below reviewed with plan for close reassessment  Initial Study Results:   Laboratory  All laboratory results reviewed without evidence of clinically relevant pathology.   Exceptions include: CKD appreciated  EKG EKG was reviewed independently. Rate, rhythm, axis, intervals all examined and without medically relevant abnormality. ST segments without concerns for elevations.    Radiology  All images reviewed independently. Agree with radiology report at this time.   DG Chest 2 View  Result Date: 11/22/2021 CLINICAL DATA:  Shortness of breath EXAM: CHEST - 2 VIEW COMPARISON:  09/07/2021 FINDINGS: Aortic valve prosthesis. Electronic recording device over left chest. Cardiomegaly with vascular congestion. Moderate slightly loculated right pleural effusion. Airspace disease at the right base. No visible pneumothorax. IMPRESSION: Cardiomegaly with vascular congestion. Moderate slightly loculated right pleural effusion with airspace disease at the right base, no significant change since radiograph from August 22. Electronically Signed   By: Donavan Foil [name]   On: 11/22/2021 19:41      Final Assessment and Plan:   Patient observed in the emergency department for 4 hours.  She is hemodynamically stable in no acute distress.  She is not tachypneic she is not hypoxic she is not tachycardic. In no acute evidence of significant heart failure exacerbation.  Likely moderately volume  overloaded secondary to her torsemide noncompliance secondary to prescription delay. She stated that she was told the prescription is ready at this time and she plans to go pick it up this morning. She has already been treated with a diuretic today prior to coming in and is now asymptomatic in no acute distress.  Ultimately, no acute indication for intervention in the emergency department, patient is clinically stable for outpatient follow-up with PCP and reassessment in the morning and continuation of her diuretic care plan in the outpatient setting.   Disposition:  I have considered need for hospitalization, however, considering all of the above, I believe this patient is stable for discharge at this time.  Patient/family educated about specific return precautions for given chief complaint and symptoms.  Patient/family educated about follow-up with PCP.     Patient/family expressed understanding of return precautions and need for follow-up. Patient spoken to regarding all imaging and laboratory results and appropriate follow up for these results. All education provided in verbal form with additional information in written form. Time was allowed for answering of patient  questions. Patient discharged.    Emergency Department Medication Summary:   Medications  albuterol (VENTOLIN HFA) 108 (90 Base) MCG/ACT inhaler 2 puff (has no administration in time range)         Clinical Impression:  1. Acute on chronic congestive heart failure, unspecified heart failure type [facility])      Discharge   Final Clinical Impression(s) / ED Diagnoses Final diagnoses:  Acute on chronic congestive heart failure, unspecified heart failure type [facility])    Rx / DC Orders ED Discharge Orders     None         Tretha Sciara, MD 11/22/21 2205

## 2021-11-30 ENCOUNTER — Emergency Department (HOSPITAL_BASED_OUTPATIENT_CLINIC_OR_DEPARTMENT_OTHER)
Admission: EM | Admit: 2021-11-30 | Discharge: 2021-11-30 | Disposition: A | Discharge status: Home / Self Care | Payer: Medicare PPO | Attending: Emergency Medicine | Admitting: Emergency Medicine

## 2021-11-30 ENCOUNTER — Emergency Department (HOSPITAL_BASED_OUTPATIENT_CLINIC_OR_DEPARTMENT_OTHER): Payer: Medicare PPO | Admitting: Radiology

## 2021-11-30 ENCOUNTER — Emergency Department (HOSPITAL_BASED_OUTPATIENT_CLINIC_OR_DEPARTMENT_OTHER): Payer: Medicare PPO

## 2021-11-30 ENCOUNTER — Encounter (HOSPITAL_BASED_OUTPATIENT_CLINIC_OR_DEPARTMENT_OTHER): Payer: Self-pay

## 2021-11-30 DIAGNOSIS — I509 Heart failure, unspecified: Secondary | ICD-10-CM | POA: Insufficient documentation

## 2021-11-30 DIAGNOSIS — S0101XA Laceration without foreign body of scalp, initial encounter: Secondary | ICD-10-CM | POA: Insufficient documentation

## 2021-11-30 DIAGNOSIS — W109XXA Fall (on) (from) unspecified stairs and steps, initial encounter: Secondary | ICD-10-CM | POA: Diagnosis not present

## 2021-11-30 DIAGNOSIS — S0990XA Unspecified injury of head, initial encounter: Secondary | ICD-10-CM

## 2021-11-30 DIAGNOSIS — S46911A Strain of unspecified muscle, fascia and tendon at shoulder and upper arm level, right arm, initial encounter: Secondary | ICD-10-CM | POA: Insufficient documentation

## 2021-11-30 DIAGNOSIS — Z794 Long term (current) use of insulin: Secondary | ICD-10-CM | POA: Diagnosis not present

## 2021-11-30 DIAGNOSIS — Z79899 Other long term (current) drug therapy: Secondary | ICD-10-CM | POA: Diagnosis not present

## 2021-11-30 DIAGNOSIS — Z7982 Long term (current) use of aspirin: Secondary | ICD-10-CM | POA: Diagnosis not present

## 2021-11-30 DIAGNOSIS — Z23 Encounter for immunization: Secondary | ICD-10-CM | POA: Insufficient documentation

## 2021-11-30 DIAGNOSIS — W108XXA Fall (on) (from) other stairs and steps, initial encounter: Secondary | ICD-10-CM

## 2021-11-30 MED ORDER — LIDOCAINE-EPINEPHRINE (PF) 2 %-1:200000 IJ SOLN
10.0000 mL | Freq: Once | INTRAMUSCULAR | Status: AC
  Administered 2021-11-30: 10 mL
  Filled 2021-11-30: qty 20

## 2021-11-30 MED ORDER — TETANUS-DIPHTH-ACELL PERTUSSIS 5-2.5-18.5 LF-MCG/0.5 IM SUSY
0.5000 mL | PREFILLED_SYRINGE | Freq: Once | INTRAMUSCULAR | Status: AC
  Administered 2021-11-30: 0.5 mL via INTRAMUSCULAR
  Filled 2021-11-30: qty 0.5

## 2021-11-30 NOTE — ED Provider Notes (Signed)
[facility] EMERGENCY DEPT  Provider Note  CSN: [id number] Arrival date & time: 11/30/21 7867  History Chief Complaint  Patient presents with   [name] is a 81 y.o. female with history of CHF reports she fell down several stairs this evening when she got up to walk the dog, hitting the back of her head and injuring her R shoulder. She did not have LOC. Not on anticoagulation.    Home Medications Prior to Admission medications   Medication Sig Start Date End Date Taking? Authorizing Provider  acetaminophen (TYLENOL) 325 MG tablet Take 650 mg by mouth every 6 (six) hours as needed for moderate pain or headache.     [provider]  amLODipine (NORVASC) 10 MG tablet TAKE 1 TABLET BY MOUTH EVERY DAY 11/02/21   Patwardhan, Manish J, MD  aspirin EC 81 MG tablet Take 81 mg by mouth daily.    [provider]  brimonidine (ALPHAGAN) 0.2 % ophthalmic solution Place 1 drop into the left eye daily. 04/19/19   [provider]  ferrous sulfate 325 (65 FE) MG tablet Take 325 mg by mouth daily with breakfast.    [provider]  folic acid (FOLVITE) 1 MG tablet Take 1 mg by mouth daily.    [provider]  HUMALOG KWIKPEN 100 UNIT/ML KwikPen Inject 1-5 Units into the skin See admin instructions. Inject 1-5 units 3 times daily with meals and at bedtime. Dose depends on what pt is eating. 05/28/18   [provider]  hydrALAZINE (APRESOLINE) 100 MG tablet Take 100 mg by mouth 3 (three) times daily. 08/09/21   [provider]  insulin glargine (LANTUS) 100 UNIT/ML injection Inject 0.06 mLs (6 Units total) into the skin at bedtime. 09/14/21   Arrien, Jimmy Picket, MD  isosorbide mononitrate (IMDUR) 120 MG 24 hr tablet Take 2 tablets (240 mg total) by mouth daily. 08/23/21 02/19/22  Patwardhan, Reynold Bowen, MD  labetalol (NORMODYNE) 200 MG tablet TAKE 1 TABLET BY MOUTH TWICE A DAY 11/02/21   Patwardhan, Manish J, MD  REPATHA  SURECLICK 672 MG/ML SOAJ INJECT 1 ML INTO THE SKIN EVERY 14 (FOURTEEN) DAYS. 02/05/21   Patwardhan, Reynold Bowen, MD  sodium zirconium cyclosilicate (LOKELMA) 10 g PACK packet Take 10 g by mouth daily as needed (high potassium).    [provider]  torsemide (DEMADEX) 20 MG tablet Take 4 tablets (80 mg total) by mouth daily. 09/14/21 10/14/21  Arrien, Jimmy Picket, MD  venlafaxine XR (EFFEXOR-XR) 150 MG 24 hr capsule Take 1 capsule (150 mg total) by mouth daily. 02/27/20   Bary Leriche, PA-C     Allergies    Patient has no known allergies.   Review of Systems   Review of Systems Please see HPI for pertinent positives and negatives  Physical Exam BP (!) 172/53   Pulse (!) 52   Temp 98 F (36.7 C) (Oral)   Resp 18   Ht 5' 2.5" (1.588 m)   Wt 71.2 kg   SpO2 97%   BMI 28.26 kg/m   Physical Exam Vitals and nursing note reviewed.  Constitutional:      Appearance: Normal appearance.  HENT:     Head: Normocephalic.     Comments: 8cm stellate laceration to R occipital scalp    Nose: Nose normal.     Mouth/Throat:     Mouth: Mucous membranes are moist.  Eyes:     Extraocular Movements: Extraocular movements intact.  Conjunctiva/sclera: Conjunctivae normal.  Cardiovascular:     Rate and Rhythm: Normal rate.  Pulmonary:     Effort: Pulmonary effort is normal.     Breath sounds: Normal breath sounds.  Abdominal:     General: Abdomen is flat.     Palpations: Abdomen is soft.     Tenderness: There is no abdominal tenderness.  Musculoskeletal:        General: Tenderness (R anterior shoulder, no deformity) present. No swelling. Normal range of motion.     Cervical back: Neck supple. No tenderness.  Skin:    General: Skin is warm and dry.  Neurological:     General: No focal deficit present.     Mental Status: She is alert.  Psychiatric:        Mood and Affect: Mood normal.     ED Results / Procedures / Treatments   EKG None  Procedures .Marland KitchenLaceration  Repair  Date/Time: 11/30/2021 5:51 AM  Performed by: Truddie Hidden, MD Authorized by: Truddie Hidden, MD   Consent:    Consent obtained:  Verbal   Consent given by:  Patient Anesthesia:    Anesthesia method:  Local infiltration   Local anesthetic:  Lidocaine 2% WITH epi Laceration details:    Location:  Scalp   Scalp location:  Occipital   Length (cm):  8 (stellate) Exploration:    Limited defect created (wound extended): no   Treatment:    Area cleansed with:  Saline   Amount of cleaning:  Standard Skin repair:    Repair method:  Staples   Number of staples:  8 Approximation:    Approximation:  Close Repair type:    Repair type:  Simple Post-procedure details:    Dressing:  Non-adherent dressing   Procedure completion:  Tolerated well, no immediate complications   Medications Ordered in the ED Medications  Tdap (BOOSTRIX) injection 0.5 mL (0.5 mLs Intramuscular Given 11/30/21 0507)  lidocaine-EPINEPHrine (XYLOCAINE W/EPI) 2 %-1:200000 (PF) injection 10 mL (10 mLs Infiltration Given 11/30/21 0507)    Initial Impression and Plan  Patient here after mechanical fall with head injury and R shoulder injuries. I personally viewed the images from radiology studies and agree with radiologist interpretation: CT neg for intracranial or cervical spin injury. Shoulder is neg for fracture. Wound repaired as above. Sling for comfort. Recommend APAP for pain. Wound care instructions given. PCP follouwp for staple removal.   ED Course       MDM Rules/Calculators/A&P Medical Decision Making Problems Addressed: Fall down stairs, initial encounter: acute illness or injury Injury of head, initial encounter: acute illness or injury Laceration of scalp, initial encounter: acute illness or injury Right shoulder strain, initial encounter: acute illness or injury  Amount and/or Complexity of Data Reviewed Radiology: ordered and independent interpretation performed.  Decision-making details documented in ED Course.  Risk Prescription drug management.    Final Clinical Impression(s) / ED Diagnoses Final diagnoses:  Fall down stairs, initial encounter  Injury of head, initial encounter  Laceration of scalp, initial encounter  Right shoulder strain, initial encounter    Rx / DC Orders ED Discharge Orders     None        Truddie Hidden, MD 11/30/21 [phone]

## 2021-11-30 NOTE — ED Triage Notes (Addendum)
Pt presents to the ED with hematoma/laceration to right posterior scalp after falling down 7-8 stairs. Pt denies use of blood thinners. Denies LOC. Bleeding controlled at time of triage. Pt also reports right shoulder pain

## 2021-11-30 NOTE — ED Notes (Signed)
MD notified. VO for head ct to be placed.

## 2021-11-30 NOTE — ED Notes (Signed)
Pt verbalizes understanding of discharge instructions. Opportunity for questioning and answers were provided. Pt discharged from ED to home with brother.

## 2021-12-06 ENCOUNTER — Other Ambulatory Visit: Payer: Self-pay | Admitting: Student

## 2021-12-20 ENCOUNTER — Other Ambulatory Visit: Payer: Self-pay

## 2021-12-20 ENCOUNTER — Encounter (HOSPITAL_BASED_OUTPATIENT_CLINIC_OR_DEPARTMENT_OTHER): Payer: Self-pay | Admitting: *Deleted

## 2021-12-20 ENCOUNTER — Emergency Department (HOSPITAL_BASED_OUTPATIENT_CLINIC_OR_DEPARTMENT_OTHER): Payer: Medicare PPO | Admitting: Radiology

## 2021-12-20 ENCOUNTER — Emergency Department (HOSPITAL_BASED_OUTPATIENT_CLINIC_OR_DEPARTMENT_OTHER)
Admission: EM | Admit: 2021-12-20 | Discharge: 2021-12-20 | Disposition: A | Discharge status: Home / Self Care | Payer: Medicare PPO | Attending: Emergency Medicine | Admitting: Emergency Medicine

## 2021-12-20 DIAGNOSIS — Z7982 Long term (current) use of aspirin: Secondary | ICD-10-CM | POA: Diagnosis not present

## 2021-12-20 DIAGNOSIS — Z794 Long term (current) use of insulin: Secondary | ICD-10-CM | POA: Insufficient documentation

## 2021-12-20 DIAGNOSIS — Z20822 Contact with and (suspected) exposure to covid-19: Secondary | ICD-10-CM | POA: Diagnosis not present

## 2021-12-20 DIAGNOSIS — I509 Heart failure, unspecified: Secondary | ICD-10-CM | POA: Diagnosis not present

## 2021-12-20 DIAGNOSIS — R0602 Shortness of breath: Secondary | ICD-10-CM | POA: Diagnosis present

## 2021-12-20 HISTORY — DX: Heart failure, unspecified: I50.9

## 2021-12-20 LAB — BRAIN NATRIURETIC PEPTIDE: B Natriuretic Peptide: 789.4 pg/mL — ABNORMAL HIGH (ref 0.0–100.0)

## 2021-12-20 LAB — CBC WITH DIFFERENTIAL/PLATELET
Abs Immature Granulocytes: 0.02 10*3/uL (ref 0.00–0.07)
Basophils Absolute: 0 10*3/uL (ref 0.0–0.1)
Basophils Relative: 0 %
Eosinophils Absolute: 0.2 10*3/uL (ref 0.0–0.5)
Eosinophils Relative: 2 %
HCT: 27.6 % — ABNORMAL LOW (ref 36.0–46.0)
Hemoglobin: 9.2 g/dL — ABNORMAL LOW (ref 12.0–15.0)
Immature Granulocytes: 0 %
Lymphocytes Relative: 16 %
Lymphs Abs: 1.6 10*3/uL (ref 0.7–4.0)
MCH: 27.8 pg (ref 26.0–34.0)
MCHC: 33.3 g/dL (ref 30.0–36.0)
MCV: 83.4 fL (ref 80.0–100.0)
Monocytes Absolute: 0.8 10*3/uL (ref 0.1–1.0)
Monocytes Relative: 8 %
Neutro Abs: 7.1 10*3/uL (ref 1.7–7.7)
Neutrophils Relative %: 74 %
Platelets: 284 10*3/uL (ref 150–400)
RBC: 3.31 MIL/uL — ABNORMAL LOW (ref 3.87–5.11)
RDW: 15.6 % — ABNORMAL HIGH (ref 11.5–15.5)
WBC: 9.8 10*3/uL (ref 4.0–10.5)
nRBC: 0 % (ref 0.0–0.2)

## 2021-12-20 LAB — TROPONIN I (HIGH SENSITIVITY)
Troponin I (High Sensitivity): 19 ng/L — ABNORMAL HIGH (ref <18)
Troponin I (High Sensitivity): 17 ng/L (ref <18)

## 2021-12-20 LAB — BASIC METABOLIC PANEL
Anion gap: 14 (ref 5–15)
BUN: 76 mg/dL — ABNORMAL HIGH (ref 8–23)
CO2: 19 mmol/L — ABNORMAL LOW (ref 22–32)
Calcium: 8.7 mg/dL — ABNORMAL LOW (ref 8.9–10.3)
Chloride: 103 mmol/L (ref 98–111)
Creatinine, Ser: 3.93 mg/dL — ABNORMAL HIGH (ref 0.44–1.00)
GFR, Estimated: 11 mL/min — ABNORMAL LOW (ref >60)
Glucose, Bld: 139 mg/dL — ABNORMAL HIGH (ref 70–99)
Potassium: 3.4 mmol/L — ABNORMAL LOW (ref 3.5–5.1)
Sodium: 136 mmol/L (ref 135–145)

## 2021-12-20 LAB — RESP PANEL BY RT-PCR (FLU A&B, COVID) ARPGX2
Influenza A by PCR: NEGATIVE
Influenza B by PCR: NEGATIVE
SARS Coronavirus 2 by RT PCR: NEGATIVE

## 2021-12-20 MED ORDER — FUROSEMIDE 10 MG/ML IJ SOLN
80.0000 mg | Freq: Once | INTRAMUSCULAR | Status: AC
  Administered 2021-12-20: 80 mg via INTRAVENOUS
  Filled 2021-12-20: qty 8

## 2021-12-20 NOTE — Discharge Instructions (Addendum)
It was a pleasure caring for you today in the emergency department.   Please elevate legs when at rest and use compression stockings daily   Please return to the emergency department for any worsening or worrisome symptoms.

## 2021-12-20 NOTE — ED Triage Notes (Addendum)
Pt states she woke up at 4am feeling sob. C/o sore throat. C/o posterior headache. Denies any fevers. Pt has a hx of CHF. Pt presents sob and a cough. No distress. C/o bilateral legs hurting. Denies chest pain. Pt has a AV fistula in her left arm. No current dialysis.

## 2021-12-20 NOTE — ED Provider Notes (Signed)
[name] EMERGENCY DEPT  Provider Note  CSN: [id number] Arrival date & time: 12/20/21 0441  History Chief Complaint  Patient presents with   Shortness of Breath    [name] is a 81 y.o. female with history of CHF reports she went to bed last night feeling like she had 'a little extra fluid'. She woke up during the night feeling more SOB. Denies chest pain. Has had some cough but no fever. She reports she has been taking her Torsemide as prescribed with normal urine output.    Home Medications Prior to Admission medications   Medication Sig Start Date End Date Taking? Authorizing Provider  acetaminophen (TYLENOL) 325 MG tablet Take 650 mg by mouth every 6 (six) hours as needed for moderate pain or headache.     [provider]  amLODipine (NORVASC) 10 MG tablet TAKE 1 TABLET BY MOUTH EVERY DAY 11/02/21   Patwardhan, Manish J, MD  aspirin EC 81 MG tablet Take 81 mg by mouth daily.    [provider]  brimonidine (ALPHAGAN) 0.2 % ophthalmic solution Place 1 drop into the left eye daily. 04/19/19   [provider]  ferrous sulfate 325 (65 FE) MG tablet Take 325 mg by mouth daily with breakfast.    [provider]  folic acid (FOLVITE) 1 MG tablet Take 1 mg by mouth daily.    [provider]  HUMALOG KWIKPEN 100 UNIT/ML KwikPen Inject 1-5 Units into the skin See admin instructions. Inject 1-5 units 3 times daily with meals and at bedtime. Dose depends on what pt is eating. 05/28/18   [provider]  hydrALAZINE (APRESOLINE) 100 MG tablet Take 100 mg by mouth 3 (three) times daily. 08/09/21   [provider]  insulin glargine (LANTUS) 100 UNIT/ML injection Inject 0.06 mLs (6 Units total) into the skin at bedtime. 09/14/21   Arrien, Jimmy Picket, MD  isosorbide mononitrate (IMDUR) 120 MG 24 hr tablet Take 2 tablets (240 mg total) by mouth daily. 08/23/21 02/19/22  Patwardhan, Reynold Bowen, MD  labetalol (NORMODYNE) 200  MG tablet TAKE 1 TABLET BY MOUTH TWICE A DAY 11/02/21   Patwardhan, Manish J, MD  REPATHA SURECLICK 106 MG/ML SOAJ INJECT 1 ML INTO THE SKIN EVERY 14 (FOURTEEN) DAYS. 02/05/21   Patwardhan, Reynold Bowen, MD  sodium zirconium cyclosilicate (LOKELMA) 10 g PACK packet Take 10 g by mouth daily as needed (high potassium).    [provider]  torsemide (DEMADEX) 20 MG tablet Take 4 tablets (80 mg total) by mouth daily. 09/14/21 10/14/21  Arrien, Jimmy Picket, MD  venlafaxine XR (EFFEXOR-XR) 150 MG 24 hr capsule Take 1 capsule (150 mg total) by mouth daily. 02/27/20   Bary Leriche, PA-C     Allergies    Patient has no known allergies.   Review of Systems   Review of Systems Please see HPI for pertinent positives and negatives  Physical Exam BP (!) 146/74   Pulse (!) 25   Temp 98.2 F (36.8 C) (Oral)   Resp 13   Ht 5' 2.5" (1.588 m)   Wt 73.9 kg   SpO2 95%   BMI 29.34 kg/m   Physical Exam Vitals and nursing note reviewed.  Constitutional:      Appearance: Normal appearance.  HENT:     Head: Normocephalic and atraumatic.     Nose: Nose normal.     Mouth/Throat:     Mouth: Mucous membranes are moist.  Eyes:  Extraocular Movements: Extraocular movements intact.     Conjunctiva/sclera: Conjunctivae normal.  Cardiovascular:     Rate and Rhythm: Normal rate.  Pulmonary:     Effort: Pulmonary effort is normal. No tachypnea.     Breath sounds: Normal breath sounds. No rhonchi or rales.  Abdominal:     General: Abdomen is flat.     Palpations: Abdomen is soft.     Tenderness: There is no abdominal tenderness.  Musculoskeletal:        General: No swelling. Normal range of motion.     Cervical back: Neck supple.     Right lower leg: Edema (2+) present.     Left lower leg: Edema (2+) present.  Skin:    General: Skin is warm and dry.  Neurological:     General: No focal deficit present.     Mental Status: She is alert.  Psychiatric:        Mood and Affect: Mood normal.      ED Results / Procedures / Treatments   EKG EKG Interpretation  Date/Time:  Monday December 20 2021 05:09:19 EST Ventricular Rate:  58 PR Interval:  220 QRS Duration: 92 QT Interval:  440 QTC Calculation: 431 R Axis:   35 Text Interpretation: Sinus bradycardia with 1st degree A-V block Low voltage QRS Cannot rule out Anterior infarct (cited on or before 22-Nov-2021) Abnormal ECG When compared with ECG of 22-Nov-2021 19:12, No significant change was found Confirmed by Calvert Cantor [phone]) on 12/20/2021 5:10:36 AM  Procedures Procedures  Medications Ordered in the ED Medications - No data to display  Initial Impression and Plan  Patient with multiple medical problems including CHF here for SOB. Overall well appearing with reassuring vitals and no hypoxia or respiratory distress. Will check labs, including BNP and nasal swab. CXR pending. EKG unchanged from baseline.   ED Course   Clinical Course as of 12/20/21 1639  Mon Dec 20, 2021  0553 CBC with anemia at baseline.  [CS]  8676 Covid/Flu are neg.  [CS]  7209 I personally viewed the images from radiology studies and agree with radiologist interpretation:  CXR with improvement from most recent previous last month [CS]  0620 BMP with CKD at baseline. BNP is moderately elevated but improved from recent values. Trop is also borderline elevated but not as high as previous. Will give a dose of Lasix for diuresis and monitor for a second Trop.  [CS]  [phone] Care of the patient signed out to Dr. Pearline Cables at the change of shift.  [CS]    Clinical Course User Index [CS] Truddie Hidden, MD     MDM Rules/Calculators/A&P Medical Decision Making Amount and/or Complexity of Data Reviewed Labs: ordered. Radiology: ordered.  Risk Prescription drug management.    Final Clinical Impression(s) / ED Diagnoses Final diagnoses:  None    Rx / DC Orders ED Discharge Orders     None        Truddie Hidden, MD 12/20/21  [phone]

## 2021-12-20 NOTE — ED Provider Notes (Addendum)
  Provider Note MRN:  [id number]  Arrival date & time: 12/20/21    ED Course and Medical Decision Making  Assumed care from [place] at shift change.  See note from prior team for complete details, in brief:   81 yo female Hx ckd Labs improved from bl Lasix given Delta trop pending HDS  Plan per prior physician f/u delta trop  Delta trop is neg Pt feeling better Breathing comfortably on ambient air No cp Discussed elevation and compression stockings Will plan for discharge given plan from prior team   The patient improved significantly and was discharged in stable condition. Detailed discussions were had with the patient regarding current findings, and need for close f/u with PCP or on call doctor. The patient has been instructed to return immediately if the symptoms worsen in any way for re-evaluation. Patient verbalized understanding and is in agreement with current care plan. All questions answered prior to discharge.    Procedures  Final Clinical Impressions(s) / ED Diagnoses     ICD-10-CM   1. Chronic congestive heart failure, unspecified heart failure type ([place])  I50.9       ED Discharge Orders     None         Discharge Instructions      It was a pleasure caring for you today in the emergency department.   Please elevate legs when at rest and use compression stockings daily   Please return to the emergency department for any worsening or worrisome symptoms.          Jeanell Sparrow, DO 12/20/21 [place], Chical, DO 12/20/21 [phone]

## 2021-12-20 NOTE — ED Notes (Signed)
RT assessed pt in triage. Pt respiratory status stable on RA w/sats of 100%, BLBS clear/dim. RT will continue to monitor while at [facility] ED.

## 2022-01-14 ENCOUNTER — Other Ambulatory Visit: Payer: Self-pay | Admitting: Student

## 2022-01-17 ENCOUNTER — Encounter (HOSPITAL_BASED_OUTPATIENT_CLINIC_OR_DEPARTMENT_OTHER): Payer: Self-pay

## 2022-01-17 ENCOUNTER — Encounter (HOSPITAL_COMMUNITY): Payer: Self-pay

## 2022-01-17 ENCOUNTER — Emergency Department (HOSPITAL_BASED_OUTPATIENT_CLINIC_OR_DEPARTMENT_OTHER): Payer: Medicare PPO

## 2022-01-17 ENCOUNTER — Inpatient Hospital Stay (HOSPITAL_BASED_OUTPATIENT_CLINIC_OR_DEPARTMENT_OTHER)
Admission: EM | Admit: 2022-01-17 | Discharge: 2022-01-24 | DRG: 291 | Disposition: A | Discharge status: Home / Self Care | Payer: Medicare PPO | Attending: Family Medicine | Admitting: Family Medicine

## 2022-01-17 ENCOUNTER — Other Ambulatory Visit: Payer: Self-pay

## 2022-01-17 DIAGNOSIS — B338 Other specified viral diseases: Secondary | ICD-10-CM

## 2022-01-17 DIAGNOSIS — E1122 Type 2 diabetes mellitus with diabetic chronic kidney disease: Secondary | ICD-10-CM | POA: Diagnosis present

## 2022-01-17 DIAGNOSIS — Z7982 Long term (current) use of aspirin: Secondary | ICD-10-CM

## 2022-01-17 DIAGNOSIS — J9601 Acute respiratory failure with hypoxia: Secondary | ICD-10-CM | POA: Diagnosis present

## 2022-01-17 DIAGNOSIS — Z79899 Other long term (current) drug therapy: Secondary | ICD-10-CM

## 2022-01-17 DIAGNOSIS — F32A Depression, unspecified: Secondary | ICD-10-CM | POA: Diagnosis present

## 2022-01-17 DIAGNOSIS — Z1152 Encounter for screening for COVID-19: Secondary | ICD-10-CM | POA: Diagnosis not present

## 2022-01-17 DIAGNOSIS — M1990 Unspecified osteoarthritis, unspecified site: Secondary | ICD-10-CM | POA: Diagnosis present

## 2022-01-17 DIAGNOSIS — E876 Hypokalemia: Secondary | ICD-10-CM | POA: Diagnosis not present

## 2022-01-17 DIAGNOSIS — E1142 Type 2 diabetes mellitus with diabetic polyneuropathy: Secondary | ICD-10-CM | POA: Diagnosis present

## 2022-01-17 DIAGNOSIS — K219 Gastro-esophageal reflux disease without esophagitis: Secondary | ICD-10-CM | POA: Diagnosis present

## 2022-01-17 DIAGNOSIS — E46 Unspecified protein-calorie malnutrition: Secondary | ICD-10-CM | POA: Diagnosis present

## 2022-01-17 DIAGNOSIS — I5033 Acute on chronic diastolic (congestive) heart failure: Secondary | ICD-10-CM | POA: Diagnosis present

## 2022-01-17 DIAGNOSIS — E1129 Type 2 diabetes mellitus with other diabetic kidney complication: Secondary | ICD-10-CM | POA: Diagnosis present

## 2022-01-17 DIAGNOSIS — E669 Obesity, unspecified: Secondary | ICD-10-CM | POA: Diagnosis present

## 2022-01-17 DIAGNOSIS — I132 Hypertensive heart and chronic kidney disease with heart failure and with stage 5 chronic kidney disease, or end stage renal disease: Secondary | ICD-10-CM | POA: Diagnosis present

## 2022-01-17 DIAGNOSIS — D631 Anemia in chronic kidney disease: Secondary | ICD-10-CM | POA: Diagnosis present

## 2022-01-17 DIAGNOSIS — Z8249 Family history of ischemic heart disease and other diseases of the circulatory system: Secondary | ICD-10-CM

## 2022-01-17 DIAGNOSIS — Z6826 Body mass index (BMI) 26.0-26.9, adult: Secondary | ICD-10-CM

## 2022-01-17 DIAGNOSIS — Z952 Presence of prosthetic heart valve: Secondary | ICD-10-CM

## 2022-01-17 DIAGNOSIS — J189 Pneumonia, unspecified organism: Secondary | ICD-10-CM | POA: Diagnosis not present

## 2022-01-17 DIAGNOSIS — N184 Chronic kidney disease, stage 4 (severe): Secondary | ICD-10-CM | POA: Diagnosis not present

## 2022-01-17 DIAGNOSIS — N185 Chronic kidney disease, stage 5: Secondary | ICD-10-CM | POA: Diagnosis not present

## 2022-01-17 DIAGNOSIS — I160 Hypertensive urgency: Secondary | ICD-10-CM | POA: Diagnosis not present

## 2022-01-17 DIAGNOSIS — Z955 Presence of coronary angioplasty implant and graft: Secondary | ICD-10-CM

## 2022-01-17 DIAGNOSIS — E11319 Type 2 diabetes mellitus with unspecified diabetic retinopathy without macular edema: Secondary | ICD-10-CM | POA: Diagnosis present

## 2022-01-17 DIAGNOSIS — N171 Acute kidney failure with acute cortical necrosis: Secondary | ICD-10-CM | POA: Diagnosis not present

## 2022-01-17 DIAGNOSIS — I2510 Atherosclerotic heart disease of native coronary artery without angina pectoris: Secondary | ICD-10-CM | POA: Diagnosis present

## 2022-01-17 DIAGNOSIS — E872 Acidosis, unspecified: Secondary | ICD-10-CM | POA: Diagnosis present

## 2022-01-17 DIAGNOSIS — M898X9 Other specified disorders of bone, unspecified site: Secondary | ICD-10-CM | POA: Diagnosis present

## 2022-01-17 DIAGNOSIS — E440 Moderate protein-calorie malnutrition: Secondary | ICD-10-CM | POA: Insufficient documentation

## 2022-01-17 DIAGNOSIS — Z90710 Acquired absence of both cervix and uterus: Secondary | ICD-10-CM

## 2022-01-17 DIAGNOSIS — N186 End stage renal disease: Secondary | ICD-10-CM | POA: Diagnosis present

## 2022-01-17 DIAGNOSIS — N189 Chronic kidney disease, unspecified: Secondary | ICD-10-CM | POA: Diagnosis present

## 2022-01-17 DIAGNOSIS — E785 Hyperlipidemia, unspecified: Secondary | ICD-10-CM | POA: Diagnosis present

## 2022-01-17 DIAGNOSIS — Z794 Long term (current) use of insulin: Secondary | ICD-10-CM

## 2022-01-17 DIAGNOSIS — J121 Respiratory syncytial virus pneumonia: Secondary | ICD-10-CM | POA: Diagnosis present

## 2022-01-17 DIAGNOSIS — Z9049 Acquired absence of other specified parts of digestive tract: Secondary | ICD-10-CM

## 2022-01-17 DIAGNOSIS — N179 Acute kidney failure, unspecified: Secondary | ICD-10-CM | POA: Diagnosis present

## 2022-01-17 DIAGNOSIS — Z833 Family history of diabetes mellitus: Secondary | ICD-10-CM

## 2022-01-17 DIAGNOSIS — I5031 Acute diastolic (congestive) heart failure: Secondary | ICD-10-CM | POA: Diagnosis not present

## 2022-01-17 DIAGNOSIS — Z8673 Personal history of transient ischemic attack (TIA), and cerebral infarction without residual deficits: Secondary | ICD-10-CM

## 2022-01-17 LAB — TROPONIN I (HIGH SENSITIVITY)
Troponin I (High Sensitivity): 46 ng/L — ABNORMAL HIGH (ref <18)
Troponin I (High Sensitivity): 48 ng/L — ABNORMAL HIGH (ref <18)

## 2022-01-17 LAB — COMPREHENSIVE METABOLIC PANEL
ALT: 9 U/L (ref 0–44)
AST: 22 U/L (ref 15–41)
Albumin: 3.7 g/dL (ref 3.5–5.0)
Alkaline Phosphatase: 84 U/L (ref 38–126)
Anion gap: 17 — ABNORMAL HIGH (ref 5–15)
BUN: 60 mg/dL — ABNORMAL HIGH (ref 8–23)
CO2: 18 mmol/L — ABNORMAL LOW (ref 22–32)
Calcium: 9 mg/dL (ref 8.9–10.3)
Chloride: 100 mmol/L (ref 98–111)
Creatinine, Ser: 3.75 mg/dL — ABNORMAL HIGH (ref 0.44–1.00)
GFR, Estimated: 11 mL/min — ABNORMAL LOW (ref >60)
Glucose, Bld: 206 mg/dL — ABNORMAL HIGH (ref 70–99)
Potassium: 4.4 mmol/L (ref 3.5–5.1)
Sodium: 135 mmol/L (ref 135–145)
Total Bilirubin: 0.9 mg/dL (ref 0.3–1.2)
Total Protein: 7 g/dL (ref 6.5–8.1)

## 2022-01-17 LAB — GLUCOSE, CAPILLARY
Glucose-Capillary: 160 mg/dL — ABNORMAL HIGH (ref 70–99)
Glucose-Capillary: 177 mg/dL — ABNORMAL HIGH (ref 70–99)
Glucose-Capillary: 193 mg/dL — ABNORMAL HIGH (ref 70–99)

## 2022-01-17 LAB — CBC WITH DIFFERENTIAL/PLATELET
Abs Immature Granulocytes: 0.1 10*3/uL — ABNORMAL HIGH (ref 0.00–0.07)
Basophils Absolute: 0.1 10*3/uL (ref 0.0–0.1)
Basophils Relative: 0 %
Eosinophils Absolute: 0 10*3/uL (ref 0.0–0.5)
Eosinophils Relative: 0 %
HCT: 30.2 % — ABNORMAL LOW (ref 36.0–46.0)
Hemoglobin: 9.8 g/dL — ABNORMAL LOW (ref 12.0–15.0)
Immature Granulocytes: 1 %
Lymphocytes Relative: 10 %
Lymphs Abs: 1.6 10*3/uL (ref 0.7–4.0)
MCH: 27.4 pg (ref 26.0–34.0)
MCHC: 32.5 g/dL (ref 30.0–36.0)
MCV: 84.4 fL (ref 80.0–100.0)
Monocytes Absolute: 1.4 10*3/uL — ABNORMAL HIGH (ref 0.1–1.0)
Monocytes Relative: 9 %
Neutro Abs: 12.5 10*3/uL — ABNORMAL HIGH (ref 1.7–7.7)
Neutrophils Relative %: 80 %
Platelets: 291 10*3/uL (ref 150–400)
RBC: 3.58 MIL/uL — ABNORMAL LOW (ref 3.87–5.11)
RDW: 16.3 % — ABNORMAL HIGH (ref 11.5–15.5)
WBC: 15.7 10*3/uL — ABNORMAL HIGH (ref 4.0–10.5)
nRBC: 0 % (ref 0.0–0.2)

## 2022-01-17 LAB — RESP PANEL BY RT-PCR (RSV, FLU A&B, COVID)  RVPGX2
Influenza A by PCR: NEGATIVE
Influenza B by PCR: NEGATIVE
Resp Syncytial Virus by PCR: POSITIVE — AB
SARS Coronavirus 2 by RT PCR: NEGATIVE

## 2022-01-17 LAB — BLOOD GAS, ARTERIAL
Acid-base deficit: 3.4 mmol/L — ABNORMAL HIGH (ref 0.0–2.0)
Bicarbonate: 20.3 mmol/L (ref 20.0–28.0)
Drawn by: 275531
O2 Saturation: 98.4 %
Patient temperature: 37
pCO2 arterial: 32 mmHg (ref 32–48)
pH, Arterial: 7.41 (ref 7.35–7.45)
pO2, Arterial: 88 mmHg (ref 83–108)

## 2022-01-17 LAB — HEPATITIS B SURFACE ANTIGEN: Hepatitis B Surface Ag: NONREACTIVE

## 2022-01-17 LAB — LACTIC ACID, PLASMA: Lactic Acid, Venous: 1 mmol/L (ref 0.5–1.9)

## 2022-01-17 LAB — BRAIN NATRIURETIC PEPTIDE: B Natriuretic Peptide: 2532.8 pg/mL — ABNORMAL HIGH (ref 0.0–100.0)

## 2022-01-17 LAB — PROCALCITONIN: Procalcitonin: 8.74 ng/mL

## 2022-01-17 LAB — CBG MONITORING, ED: Glucose-Capillary: 171 mg/dL — ABNORMAL HIGH (ref 70–99)

## 2022-01-17 MED ORDER — SODIUM CHLORIDE 0.9 % IV SOLN
500.0000 mg | INTRAVENOUS | Status: DC
  Administered 2022-01-18 – 2022-01-20 (×3): 500 mg via INTRAVENOUS
  Filled 2022-01-17 (×4): qty 5

## 2022-01-17 MED ORDER — HYDRALAZINE HCL 20 MG/ML IJ SOLN
5.0000 mg | Freq: Once | INTRAMUSCULAR | Status: AC
  Administered 2022-01-17: 5 mg via INTRAVENOUS
  Filled 2022-01-17: qty 1

## 2022-01-17 MED ORDER — FUROSEMIDE 10 MG/ML IJ SOLN
120.0000 mg | Freq: Once | INTRAVENOUS | Status: AC
  Administered 2022-01-17: 120 mg via INTRAVENOUS
  Filled 2022-01-17: qty 10

## 2022-01-17 MED ORDER — METHYLPREDNISOLONE SODIUM SUCC 125 MG IJ SOLR
125.0000 mg | Freq: Once | INTRAMUSCULAR | Status: AC
  Administered 2022-01-17: 125 mg via INTRAVENOUS
  Filled 2022-01-17: qty 2

## 2022-01-17 MED ORDER — FUROSEMIDE 10 MG/ML IJ SOLN
40.0000 mg | Freq: Once | INTRAMUSCULAR | Status: AC
  Administered 2022-01-17: 40 mg via INTRAVENOUS
  Filled 2022-01-17: qty 4

## 2022-01-17 MED ORDER — METOLAZONE 5 MG PO TABS
5.0000 mg | ORAL_TABLET | Freq: Once | ORAL | Status: AC
  Administered 2022-01-17: 5 mg via ORAL
  Filled 2022-01-17: qty 1

## 2022-01-17 MED ORDER — POLYETHYLENE GLYCOL 3350 17 G PO PACK: 17.0000 g | PACK | Freq: Every day | ORAL | Status: DC | PRN

## 2022-01-17 MED ORDER — SODIUM CHLORIDE 0.9 % IV SOLN
1.0000 g | INTRAVENOUS | Status: DC
  Filled 2022-01-17: qty 10

## 2022-01-17 MED ORDER — CHLORHEXIDINE GLUCONATE CLOTH 2 % EX PADS
6.0000 | MEDICATED_PAD | Freq: Every day | CUTANEOUS | Status: DC
  Administered 2022-01-18: 6 via TOPICAL

## 2022-01-17 MED ORDER — HYDRALAZINE HCL 20 MG/ML IJ SOLN
5.0000 mg | INTRAMUSCULAR | Status: DC | PRN
  Administered 2022-01-17: 5 mg via INTRAVENOUS
  Filled 2022-01-17 (×3): qty 1

## 2022-01-17 MED ORDER — HEPARIN SODIUM (PORCINE) 5000 UNIT/ML IJ SOLN
5000.0000 [IU] | Freq: Three times a day (TID) | INTRAMUSCULAR | Status: DC
  Administered 2022-01-17 – 2022-01-24 (×17): 5000 [IU] via SUBCUTANEOUS
  Filled 2022-01-17 (×18): qty 1

## 2022-01-17 MED ORDER — ONDANSETRON HCL 4 MG PO TABS: 4.0000 mg | ORAL_TABLET | Freq: Four times a day (QID) | ORAL | Status: DC | PRN

## 2022-01-17 MED ORDER — METHYLPREDNISOLONE SODIUM SUCC 40 MG IJ SOLR
40.0000 mg | Freq: Every day | INTRAMUSCULAR | Status: DC
  Administered 2022-01-18 – 2022-01-22 (×4): 40 mg via INTRAVENOUS
  Filled 2022-01-17 (×4): qty 1

## 2022-01-17 MED ORDER — ACETAMINOPHEN 325 MG PO TABS
650.0000 mg | ORAL_TABLET | Freq: Four times a day (QID) | ORAL | Status: DC | PRN
  Administered 2022-01-18 – 2022-01-23 (×3): 650 mg via ORAL
  Filled 2022-01-17 (×4): qty 2

## 2022-01-17 MED ORDER — SODIUM CHLORIDE 0.9 % IV SOLN
500.0000 mg | Freq: Once | INTRAVENOUS | Status: AC
  Administered 2022-01-17: 500 mg via INTRAVENOUS
  Filled 2022-01-17: qty 5

## 2022-01-17 MED ORDER — ACETAMINOPHEN 650 MG RE SUPP: 650.0000 mg | Freq: Four times a day (QID) | RECTAL | Status: DC | PRN

## 2022-01-17 MED ORDER — SODIUM CHLORIDE 0.9 % IV SOLN
1.0000 g | Freq: Once | INTRAVENOUS | Status: AC
  Administered 2022-01-17: 1 g via INTRAVENOUS
  Filled 2022-01-17: qty 10

## 2022-01-17 MED ORDER — ENOXAPARIN SODIUM 40 MG/0.4ML IJ SOSY: 40.0000 mg | PREFILLED_SYRINGE | INTRAMUSCULAR | Status: DC

## 2022-01-17 MED ORDER — INSULIN ASPART 100 UNIT/ML IJ SOLN
0.0000 [IU] | INTRAMUSCULAR | Status: DC
  Administered 2022-01-17 – 2022-01-18 (×3): 2 [IU] via SUBCUTANEOUS
  Administered 2022-01-18: 1 [IU] via SUBCUTANEOUS
  Administered 2022-01-18: 2 [IU] via SUBCUTANEOUS
  Administered 2022-01-18 (×2): 1 [IU] via SUBCUTANEOUS
  Administered 2022-01-18: 5 [IU] via SUBCUTANEOUS
  Administered 2022-01-19 (×2): 2 [IU] via SUBCUTANEOUS

## 2022-01-17 MED ORDER — FUROSEMIDE 10 MG/ML IJ SOLN
60.0000 mg | Freq: Once | INTRAMUSCULAR | Status: DC
  Filled 2022-01-17: qty 6

## 2022-01-17 MED ORDER — IPRATROPIUM-ALBUTEROL 0.5-2.5 (3) MG/3ML IN SOLN
3.0000 mL | RESPIRATORY_TRACT | Status: DC
  Administered 2022-01-17 – 2022-01-19 (×8): 3 mL via RESPIRATORY_TRACT
  Filled 2022-01-17 (×8): qty 3

## 2022-01-17 MED ORDER — ONDANSETRON HCL 4 MG/2ML IJ SOLN: 4.0000 mg | Freq: Four times a day (QID) | INTRAMUSCULAR | Status: DC | PRN

## 2022-01-17 NOTE — ED Notes (Signed)
RT placed pt on NIV/PC/BIPAP on 10/5 BUR 15 40%. Pt respiratory status is stable w/increased WOB prior to placement on NIV. Pt tolerating NIV very well at this time. RT will continue to monitor while at [facility] ED.

## 2022-01-17 NOTE — ED Notes (Signed)
Dr. Delo at bedside. 

## 2022-01-17 NOTE — ED Triage Notes (Signed)
Pt with SOB, cough, and weakness x 1 day. Pt taken straight to room from triage

## 2022-01-17 NOTE — ED Notes (Signed)
Patient reports continual improvement on BiPAP

## 2022-01-17 NOTE — H&P (Signed)
History and Physical    [name] XBM:[id number] DOB: 16-May-1940 DOA: 01/17/2022  I have briefly reviewed the patient's prior medical records in [place]  PCP: Audley Hose, MD  Patient coming from: med center  Chief Complaint: worseing SOB  HPI: [name] is a 82 y.o. female with medical history significant of s/p TAVR, CHF- diastolic, DM, HTN, HLD.   Intimally presented at [facility] with SOB, cough and weakness for  1 day.  She has been compliant with her torsemide but has had increasing leg swelling, dyspnea on exertion and orthopnea.   Patient also had fever.  In the ER, she was found to be hypoxic and febrile.  She was in respiratory distress so she was placed on BIPAP.  RSV +, BNP elevated at 2500.  Chest x-ray obtained showing increasing airspace consolidation in the right base with an associated enlarging effusion and what appears to be a new left perihilar infiltrate.   She was given 40 mg lasix x 2 with 400 cc output.  Patient was accepted to progressive care by Dr. Nevada Crane with PCCM agreeing to consult.    When patient arrived at [facility] she was showing worsening dyspnea, increased work of breathing.  PCCM consulted and plan is to take patient to the ICU for further monitoring.      Review of Systems: As per HPI otherwise 10 point review of systems negative.   Past Medical History:  Diagnosis Date   Arthritis    Cerebrovascular disease    CHF (congestive heart failure) ([place])    CKD (chronic kidney disease)    Sees Dr Hollie Salk   Coronary artery disease    Depression    Diabetes ([place])    INSULIN DEPENDENT   Diabetic peripheral neuropathy (HCC)    Diabetic retinopathy ([place])    Diverticulitis    GERD (gastroesophageal reflux disease)    History of CVA (cerebrovascular accident)    x 2 no residulal   Hyperlipidemia    Hypertension    Obesity    Renal lesion    S/P TAVR (transcatheter aortic valve replacement) 11/13/2018   s/p TAVR with a 66mm Edwards S3U via the TF  approach by Dr. [name] and Dr. Angelena Form   Severe aortic stenosis     Past Surgical History:  Procedure Laterality Date   ABDOMINAL HYSTERECTOMY     AV FISTULA PLACEMENT Left 12/18/2017   Procedure: ARTERIOVENOUS (AV) FISTULA CREATION ARM;  Surgeon: Marty Heck, MD;  Location: [facility] OR;  Service: Vascular;  Laterality: Left;   bilateral cataract surgery     CARDIAC CATHETERIZATION  01/07/2014   DR Einar Gip   COLON RESECTION  02/1999   COLONOSCOPY     COLOSTOMY  02/1999   COLOSTOMY CLOSURE  07/1999   EYE SURGERY Left 2019   FISTULA SUPERFICIALIZATION Left 06/29/2018   Procedure: FISTULA SUPERFICIALIZATION LEFT BRACHIOCEPHALIC;  Surgeon: Marty Heck, MD;  Location: [place];  Service: Vascular;  Laterality: Left;   LEFT HEART CATHETERIZATION WITH CORONARY ANGIOGRAM N/A 01/07/2014   Procedure: LEFT HEART CATHETERIZATION WITH CORONARY ANGIOGRAM;  Surgeon: Laverda Page, MD;  Location: [facility] CATH LAB;  Service: Cardiovascular;  Laterality: N/A;   LOOP RECORDER INSERTION N/A 02/17/2020   Procedure: LOOP RECORDER INSERTION;  Surgeon: Deboraha Sprang, MD;  Location: [place] CV LAB;  Service: Cardiovascular;  Laterality: N/A;   middle cerebral artery stent placement Right    OTHER SURGICAL HISTORY     laser surgery  PTCA  01/07/2014   DES to RCA    DR Einar Gip   right knee surgery Right    for infection   RIGHT/LEFT HEART CATH AND CORONARY ANGIOGRAPHY N/A 10/23/2018   Procedure: RIGHT/LEFT HEART CATH AND CORONARY ANGIOGRAPHY;  Surgeon: Nigel Mormon, MD;  Location: [place] CV LAB;  Service: Cardiovascular;  Laterality: N/A;   TEE WITHOUT CARDIOVERSION N/A 11/13/2018   Procedure: TRANSESOPHAGEAL ECHOCARDIOGRAM (TEE);  Surgeon: Burnell Blanks, MD;  Location: [place];  Service: Open Heart Surgery;  Laterality: N/A;   TRANSCATHETER AORTIC VALVE REPLACEMENT, TRANSFEMORAL  11/13/2018   TRANSCATHETER AORTIC VALVE REPLACEMENT, TRANSFEMORAL N/A 11/13/2018   Procedure:  TRANSCATHETER AORTIC VALVE REPLACEMENT, TRANSFEMORAL;  Surgeon: Burnell Blanks, MD;  Location: [place];  Service: Open Heart Surgery;  Laterality: N/A;     reports that she has never smoked. She has never used smokeless tobacco. She reports that she does not drink alcohol and does not use drugs.  No Known Allergies  Family History  Problem Relation Age of Onset   Diabetes Mother    Heart disease Mother    Prostate cancer Father    Hypertension Brother    Prostate cancer Brother     Prior to Admission medications   Medication Sig Start Date End Date Taking? Authorizing Provider  acetaminophen (TYLENOL) 325 MG tablet Take 650 mg by mouth every 6 (six) hours as needed for moderate pain or headache.     [provider]  amLODipine (NORVASC) 10 MG tablet TAKE 1 TABLET BY MOUTH EVERY DAY 11/02/21   Patwardhan, Manish J, MD  aspirin EC 81 MG tablet Take 81 mg by mouth daily.    [provider]  brimonidine (ALPHAGAN) 0.2 % ophthalmic solution Place 1 drop into the left eye daily. 04/19/19   [provider]  ferrous sulfate 325 (65 FE) MG tablet Take 325 mg by mouth daily with breakfast.    [provider]  folic acid (FOLVITE) 1 MG tablet Take 1 mg by mouth daily.    [provider]  HUMALOG KWIKPEN 100 UNIT/ML KwikPen Inject 1-5 Units into the skin See admin instructions. Inject 1-5 units 3 times daily with meals and at bedtime. Dose depends on what pt is eating. 05/28/18   [provider]  hydrALAZINE (APRESOLINE) 100 MG tablet Take 100 mg by mouth 3 (three) times daily. 08/09/21   [provider]  insulin glargine (LANTUS) 100 UNIT/ML injection Inject 0.06 mLs (6 Units total) into the skin at bedtime. 09/14/21   Arrien, Jimmy Picket, MD  isosorbide mononitrate (IMDUR) 120 MG 24 hr tablet Take 2 tablets (240 mg total) by mouth daily. 08/23/21 02/19/22  Patwardhan, Reynold Bowen, MD  labetalol (NORMODYNE) 200 MG tablet TAKE 1 TABLET BY  MOUTH TWICE A DAY 11/02/21   Patwardhan, Manish J, MD  REPATHA SURECLICK 626 MG/ML SOAJ INJECT 1 ML INTO THE SKIN EVERY 14 (FOURTEEN) DAYS. 02/05/21   Patwardhan, Reynold Bowen, MD  sodium zirconium cyclosilicate (LOKELMA) 10 g PACK packet Take 10 g by mouth daily as needed (high potassium).    [provider]  torsemide (DEMADEX) 20 MG tablet Take 4 tablets (80 mg total) by mouth daily. 09/14/21 10/14/21  Arrien, Jimmy Picket, MD  venlafaxine XR (EFFEXOR-XR) 150 MG 24 hr capsule Take 1 capsule (150 mg total) by mouth daily. 02/27/20   Bary Leriche, PA-C    Physical Exam: Vitals:   01/17/22 1315 01/17/22 1400 01/17/22 1500 01/17/22 1649  BP: Marland Kitchen)  165/74 (!) 171/64 (!) 169/69 (!) 183/63  Pulse: 73 75 72 82  Resp: (!) 23 (!) 24 (!) 24 (!) 24  Temp:    99.3 F (37.4 C)  TempSrc:    Axillary  SpO2: 100% 100% 100% 96%  Weight:          Constitutional: on bipap, + JVD, increased work of breating Eyes: PERRL, lids and conjunctivae normal ENMT: Mucous membranes are dry. On bipap Neck: normal, supple, no masses, no thyromegaly Respiratory: clear to auscultation bilaterally, no wheezing, no crackles. Normal respiratory effort. No accessory muscle use.  Cardiovascular: Regular rate and rhythm, no murmurs, + edema.  Abdomen: no tenderness, no masses palpated. Bowel sounds positive.  Musculoskeletal: no clubbing / cyanosis. Normal muscle tone.  Skin: no rashes, lesions, ulcers. No induration Neurologic: CN 2-12 grossly intact. Strength 5/5 in all 4.  Psychiatric: Normal judgment and insight. Alert and oriented x 3  Labs on Admission: I have personally reviewed following labs and imaging studies  CBC: Recent Labs  Lab 01/17/22 0117  WBC 15.7*  NEUTROABS 12.5*  HGB 9.8*  HCT 30.2*  MCV 84.4  PLT 858   Basic Metabolic Panel: Recent Labs  Lab 01/17/22 0117  NA 135  K 4.4  CL 100  CO2 18*  GLUCOSE 206*  BUN 60*  CREATININE 3.75*  CALCIUM 9.0   GFR: Estimated Creatinine  Clearance: 10.8 mL/min (A) (by C-G formula based on SCr of 3.75 mg/dL (H)). Liver Function Tests: Recent Labs  Lab 01/17/22 0117  AST 22  ALT 9  ALKPHOS 84  BILITOT 0.9  PROT 7.0  ALBUMIN 3.7   No results for input(s): "LIPASE", "AMYLASE" in the last 168 hours. No results for input(s): "AMMONIA" in the last 168 hours. Coagulation Profile: No results for input(s): "INR", "PROTIME" in the last 168 hours. Cardiac Enzymes: No results for input(s): "CKTOTAL", "CKMB", "CKMBINDEX", "TROPONINI" in the last 168 hours. BNP (last 3 results) No results for input(s): "PROBNP" in the last 8760 hours. HbA1C: No results for input(s): "HGBA1C" in the last 72 hours. CBG: Recent Labs  Lab 01/17/22 1542  GLUCAP 171*   Lipid Profile: No results for input(s): "CHOL", "HDL", "LDLCALC", "TRIG", "CHOLHDL", "LDLDIRECT" in the last 72 hours. Thyroid Function Tests: No results for input(s): "TSH", "T4TOTAL", "FREET4", "T3FREE", "THYROIDAB" in the last 72 hours. Anemia Panel: No results for input(s): "VITAMINB12", "FOLATE", "FERRITIN", "TIBC", "IRON", "RETICCTPCT" in the last 72 hours. Urine analysis:    Component Value Date/Time   COLORURINE STRAW (A) 02/08/2020 1327   APPEARANCEUR CLEAR 02/08/2020 1327   LABSPEC 1.020 06/24/2020 1903   PHURINE 6.0 06/24/2020 1903   GLUCOSEU NEGATIVE 06/24/2020 1903   HGBUR NEGATIVE 06/24/2020 1903   BILIRUBINUR NEGATIVE 06/24/2020 1903   KETONESUR NEGATIVE 06/24/2020 1903   PROTEINUR >=300 (A) 06/24/2020 1903   UROBILINOGEN 0.2 06/24/2020 1903   NITRITE NEGATIVE 06/24/2020 1903   LEUKOCYTESUR SMALL (A) 06/24/2020 1903     Radiological Exams on Admission: DG Chest Portable 1 View  Result Date: 01/17/2022 CLINICAL DATA:  Shortness of breath EXAM: PORTABLE CHEST 1 VIEW COMPARISON:  12/20/2021 FINDINGS: Cardiac shadow is enlarged. TAVR is again identified. Loop recorder is seen. Slight increase in right-sided pleural effusion is noted. Persistent right basilar  airspace opacity is noted with slight increase when compared with the prior exam. Patchy airspace opacity is noted in the left mid lung new from the prior exam. No bony abnormality is noted. IMPRESSION: Increasing airspace consolidation in the right base with associated  enlarging effusion. New left perihilar infiltrate Electronically Signed   By: Inez Catalina [name]   On: 01/17/2022 01:35     Assessment/Plan Principal Problem:   Acute on chronic diastolic CHF (congestive heart failure) (HCC) Active Problems:   DM (diabetes mellitus), type 2 with renal complications (HCC)   S/P TAVR (transcatheter aortic valve replacement)   Acute on chronic renal failure (HCC)   RSV (respiratory syncytial virus pneumonia)    Acute resp failure -on bipap -ABG pending -increased work of breathing -PCCM consult -verified with patient she is full code  RSV -supportive care -steroids -nebs -droplet -check procalcitonin- add abx if needed (given 1 dose in ER)  Acute on chronic diastolic CHF -IV lasix -may need HD- PCCM to speak with nephrology  DM -SSI  CKD stage IV -monitor labs while getting diuresis -follows with Dr. Hollie Salk  H/o TAVR -2020   DVT prophylaxis: lovenox  Code Status: full  Family Communication: brother at bedside Disposition Plan: admit Consults called: PCCM    Admission status: inpt    At the time of admission, it appears that the appropriate admission status for this patient is INPATIENT. This is judged to be reasonable and necessary in order to provide the required high service intensity to ensure the patient's safety given the presenting symptoms, physical exam findings, and initial radiographic and laboratory data in the context of their chronic comorbidities. Current circumstances are resp failure, and it is felt to place patient at high risk for further clinical deterioration threatening life, limb, or organ. Moreover, it is my clinical judgment that the patient will  require inpatient hospital care spanning beyond 2 midnights from the point of admission and that early discharge would result in unnecessary risk of decompensation and readmission or threat to life, limb or bodily function.   Geradine Girt [facility]   How to contact the [facility] Attending or Consulting provider [place] or covering provider during after hours [place], for this patient?  Check the care team in [facility] and look for a) attending/consulting TRH provider listed and b) the [facility] team listed Log into [email or web address] and use [place]'s universal password to access. If you do not have the password, please contact the hospital operator. Locate the [facility] provider you are looking for under [facility] and page to a number that you can be directly reached. If you still have difficulty reaching the provider, please page the [facility] (Director on Call) for the Hospitalists listed on amion for assistance.  01/17/2022, 4:52 PM

## 2022-01-17 NOTE — Progress Notes (Signed)
[place] Progress Note Patient Name: [name] DOB: 12-20-1940 MRN: [id number]   Date of Service  01/17/2022  HPI/Events of Note  Patient brought to ICU with plans for emergent HD. Has arm fistula for HD access. Currently on BIPAP at 50% fio2. Oxygenation ok but she is tachypneic. Rns have called IV team to place an IV line. She is hypertensive with SBP 180.   eICU Interventions  Plan per CCM and renal noted - HD planned Place IV line, will give the hydralazine after that On Abx, steroids as well Call E link if needed D/w RN      Intervention Category Major Interventions: Respiratory failure - evaluation and management Evaluation Type: New Patient Evaluation  Margaretmary Lombard 01/17/2022, 7:30 PM

## 2022-01-17 NOTE — ED Notes (Signed)
12:35 Quianna @ [facility] will send transport.-ABB(NS)

## 2022-01-17 NOTE — ED Notes (Signed)
RT note: Pt. seen with RN and is currently tolerating BiPAP/SERVO-I(NIV/PC) well, currently awaiting [place] for admission.

## 2022-01-17 NOTE — Progress Notes (Signed)
Plan of Care Note for accepted transfer   Patient: [name] MRN: [id number]   DOA: 01/17/2022  Facility requesting transfer: [facility]. Requesting Provider: Dr. Stark Jock, EDP. Reason for transfer: Acute respiratory failure.  Facility course: The patient is a 82 year old female with past medical history significant for chronic diastolic CHF, coronary artery disease status post stent placement, type 2 diabetes, who presented with progressive shortness of breath x 1 day associated with a cough and generalized weakness.  In the ED, she was placed on BiPAP.  Chest x-ray with pulmonary edema, BNP greater than 2500.  EDP requested admission for further management.  Requested PCCM consultation due to need for BiPAP.  The patient was admitted at [facility] progressive care unit as inpatient status.  Plan of care: The patient is accepted for admission to Progressive unit, at [facility] as inpatient status.  Author: Kayleen Memos, DO 01/17/2022  Check [email or web address] for on-call coverage.  Nursing staff, Please call [place] number on Amion as soon as patient's arrival, so appropriate admitting provider can evaluate the pt.

## 2022-01-17 NOTE — Consult Note (Addendum)
[facility] Nephrology Consultation Note  Requesting MD: Dr. Eulogio Bear Reason for consult: CKD w/fluid overload.  HPI:  [name] is a 82 y.o. female with past medical history significant for hypertension, DM, aortic stenosis status post TAVR, CAD, stroke, CKD 4/5 followed by Dr. Hollie Salk, presented with shortness of breath seen as a consultation for the management of CKD.  The patient has longstanding CKD with baseline serum creatinine level around 3-4.  The patient underwent left upper extremity brachiocephalic AV fistula creation by Dr. Carlis Abbott in 2020.  The fistula seems to be matured well. This time the patient presented with worsening shortness of breath associated with a cough, generalized weakness associated with the leg swelling, orthopnea.  She was on home diuretics which seems to be not helping.  In the ER she was febrile to 101.5, hypoxic which required BiPAP.  The patient was found to be positive for RSV and with high BNP due to CHF exacerbation.  She received IV Lasix twice without much improvement and transferred to [facility] for further evaluation.  On arrival to to the hospital, the respiratory status further declined therefore pulmonary critical team was consulted.  She is receiving another dose of IV Lasix now, on BiPAP and in the process of transferring to ICU.  Urine output is recorded only 400 cc. The labs showed potassium 4.4, BUN 60, creatinine 3.75, BNP 2532, WBC 15.7, hemoglobin 9.8. The chest x-ray with increased airspace consolidation in the right base and large effusion. The patient is currently on azithromycin, ceftriaxone, diuretics. She has increased work of breathing, on BiPAP therefore review of system is limited.  She agreed to start dialysis as this seems to be very imminent. PMHx:   Past Medical History:  Diagnosis Date   Arthritis    Cerebrovascular disease    CHF (congestive heart failure) ([place])    CKD (chronic kidney disease)     Sees Dr Hollie Salk   Coronary artery disease    Depression    Diabetes ([place])    INSULIN DEPENDENT   Diabetic peripheral neuropathy (HCC)    Diabetic retinopathy ([place])    Diverticulitis    GERD (gastroesophageal reflux disease)    History of CVA (cerebrovascular accident)    x 2 no residulal   Hyperlipidemia    Hypertension    Obesity    Renal lesion    S/P TAVR (transcatheter aortic valve replacement) 11/13/2018   s/p TAVR with a 67mm Edwards S3U via the TF approach by Dr. [name] and Dr. Angelena Form   Severe aortic stenosis     Past Surgical History:  Procedure Laterality Date   ABDOMINAL HYSTERECTOMY     AV FISTULA PLACEMENT Left 12/18/2017   Procedure: ARTERIOVENOUS (AV) FISTULA CREATION ARM;  Surgeon: Marty Heck, MD;  [facility]: [facility];  Service: Vascular;  Laterality: Left;   bilateral cataract surgery     CARDIAC CATHETERIZATION  01/07/2014   DR Einar Gip   COLON RESECTION  02/1999   COLONOSCOPY     COLOSTOMY  02/1999   COLOSTOMY CLOSURE  07/1999   EYE SURGERY Left 2019   FISTULA SUPERFICIALIZATION Left 06/29/2018   Procedure: FISTULA SUPERFICIALIZATION LEFT BRACHIOCEPHALIC;  Surgeon: Marty Heck, MD;  Location: [place];  Service: Vascular;  Laterality: Left;   LEFT HEART CATHETERIZATION WITH CORONARY ANGIOGRAM N/A 01/07/2014   Procedure: LEFT HEART CATHETERIZATION WITH CORONARY ANGIOGRAM;  Surgeon: Laverda Page, MD;  Location: [facility] CATH LAB;  Service: Cardiovascular;  Laterality: N/A;  LOOP RECORDER INSERTION N/A 02/17/2020   Procedure: LOOP RECORDER INSERTION;  Surgeon: Deboraha Sprang, MD;  Location: [place] CV LAB;  Service: Cardiovascular;  Laterality: N/A;   middle cerebral artery stent placement Right    OTHER SURGICAL HISTORY     laser surgery   PTCA  01/07/2014   DES to RCA    DR Einar Gip   right knee surgery Right    for infection   RIGHT/LEFT HEART CATH AND CORONARY ANGIOGRAPHY N/A 10/23/2018   Procedure: RIGHT/LEFT HEART CATH AND CORONARY ANGIOGRAPHY;   Surgeon: Nigel Mormon, MD;  Location: [place] CV LAB;  Service: Cardiovascular;  Laterality: N/A;   TEE WITHOUT CARDIOVERSION N/A 11/13/2018   Procedure: TRANSESOPHAGEAL ECHOCARDIOGRAM (TEE);  Surgeon: Burnell Blanks, MD;  Location: [place];  Service: Open Heart Surgery;  Laterality: N/A;   TRANSCATHETER AORTIC VALVE REPLACEMENT, TRANSFEMORAL  11/13/2018   TRANSCATHETER AORTIC VALVE REPLACEMENT, TRANSFEMORAL N/A 11/13/2018   Procedure: TRANSCATHETER AORTIC VALVE REPLACEMENT, TRANSFEMORAL;  Surgeon: Burnell Blanks, MD;  Location: [place];  Service: Open Heart Surgery;  Laterality: N/A;    Family Hx:  Family History  Problem Relation Age of Onset   Diabetes Mother    Heart disease Mother    Prostate cancer Father    Hypertension Brother    Prostate cancer Brother     Social History:  reports that she has never smoked. She has never used smokeless tobacco. She reports that she does not drink alcohol and does not use drugs.  Allergies: No Known Allergies  Medications: Prior to Admission medications   Medication Sig Start Date End Date Taking? Authorizing Provider  acetaminophen (TYLENOL) 325 MG tablet Take 650 mg by mouth every 6 (six) hours as needed for moderate pain or headache.     [provider]  amLODipine (NORVASC) 10 MG tablet TAKE 1 TABLET BY MOUTH EVERY DAY 11/02/21   Patwardhan, Manish J, MD  aspirin EC 81 MG tablet Take 81 mg by mouth daily.    [provider]  brimonidine (ALPHAGAN) 0.2 % ophthalmic solution Place 1 drop into the left eye daily. 04/19/19   [provider]  ferrous sulfate 325 (65 FE) MG tablet Take 325 mg by mouth daily with breakfast.    [provider]  folic acid (FOLVITE) 1 MG tablet Take 1 mg by mouth daily.    [provider]  HUMALOG KWIKPEN 100 UNIT/ML KwikPen Inject 1-5 Units into the skin See admin instructions. Inject 1-5 units 3 times daily with meals and at bedtime. Dose  depends on what pt is eating. 05/28/18   [provider]  hydrALAZINE (APRESOLINE) 100 MG tablet Take 100 mg by mouth 3 (three) times daily. 08/09/21   [provider]  insulin glargine (LANTUS) 100 UNIT/ML injection Inject 0.06 mLs (6 Units total) into the skin at bedtime. 09/14/21   Arrien, Jimmy Picket, MD  isosorbide mononitrate (IMDUR) 120 MG 24 hr tablet Take 2 tablets (240 mg total) by mouth daily. 08/23/21 02/19/22  Patwardhan, Reynold Bowen, MD  labetalol (NORMODYNE) 200 MG tablet TAKE 1 TABLET BY MOUTH TWICE A DAY 11/02/21   Patwardhan, Manish J, MD  REPATHA SURECLICK 193 MG/ML SOAJ INJECT 1 ML INTO THE SKIN EVERY 14 (FOURTEEN) DAYS. 02/05/21   Patwardhan, Reynold Bowen, MD  sodium zirconium cyclosilicate (LOKELMA) 10 g PACK packet Take 10 g by mouth daily as needed (high potassium).    [provider]  torsemide (DEMADEX) 20 MG tablet Take 4  tablets (80 mg total) by mouth daily. 09/14/21 10/14/21  Arrien, Jimmy Picket, MD  venlafaxine XR (EFFEXOR-XR) 150 MG 24 hr capsule Take 1 capsule (150 mg total) by mouth daily. 02/27/20   Love, Ivan Anchors, PA-C    I have reviewed the patient's current medications.  Labs: Renal Panel: Recent Labs    05/16/21 1801 09/07/21 1225 09/07/21 1751 09/07/21 2343 09/08/21 0243 09/08/21 4656 09/09/21 0502 09/10/21 0548 09/11/21 8127 09/12/21 0343 09/13/21 0736 09/14/21 0401 11/22/21 1918 12/20/21 0535 01/17/22 0117  NA 138   < >  --  140 141 141 141 137 138 138 139 139 138  136 135  K 3.9   < >  --  4.0 4.6 3.6 4.7 3.8 3.4* 4.1 4.3 4.5 3.9 3.4* 4.4  CL 104   < >  --  108 107 109 106 103 106 103 104 107 104  103 100  CO2 23   < >  --  21* 23 23 26 23 22 23 24 22 22  19* 18*  GLUCOSE 149*   < >  --  180* 166* 111* 100* 156* 92 99 111* 86 273* 139* 206*  BUN 46*   < >  --  63* 64* 61* 58* 64* 67* 72* 70* 73* 69* 76* 60*  CREATININE 3.28*   < >  --  3.40* 3.46* 3.35* 3.42* 3.70* 3.95* 4.25* 4.35* 4.17* 3.58* 3.93* 3.75*  CALCIUM 9.3   <  >  --  8.7* 9.2 9.1 9.2 8.7* 8.6* 8.7* 9.4 9.1 9.2 8.7* 9.0  MG  --   --  2.0  --  1.9 2.0  --   --   --   --   --   --   --   --   --   PHOS  --   --   --  3.8 4.3 3.9  --   --   --  3.4  --   --   --   --   --   ALBUMIN 3.7  --   --  2.9* 3.1* 3.0*  --   --   --  2.8*  --   --   --   --  3.7   < > = values in this interval not displayed.     CBC:    Latest Ref Rng & Units 01/17/2022    1:17 AM 12/20/2021    5:35 AM 11/22/2021    7:18 PM  CBC  WBC 4.0 - 10.5 K/uL 15.7  9.8  7.2   Hemoglobin 12.0 - 15.0 g/dL 9.8  9.2  9.6   Hematocrit 36.0 - 46.0 % 30.2  27.6  29.8   Platelets 150 - 400 K/uL 291  284  279      Anemia Panel:  Recent Labs    09/07/21 2343 09/08/21 0243 09/08/21 0737 11/22/21 1918 12/20/21 0535 01/17/22 0117  HGB  --  10.9* 10.6* 9.6* 9.2* 9.8*  MCV  --  86.8 87.0 87.1 83.4 84.4  VITAMINB12 481  --   --   --   --   --   FOLATE >40.0  --   --   --   --   --   FERRITIN 59 66  --   --   --   --   TIBC 249*  --   --   --   --   --   IRON 39  --   --   --   --   --  RETICCTPCT 1.8  --   --   --   --   --     Recent Labs  Lab 01/17/22 0117  AST 22  ALT 9  ALKPHOS 84  BILITOT 0.9  PROT 7.0  ALBUMIN 3.7    Lab Results  Component Value Date   HGBA1C 7.2 (H) 09/07/2021    ROS:  Pertinent items noted in HPI and remainder of comprehensive ROS otherwise negative.  Physical Exam: Vitals:   01/17/22 1649 01/17/22 1706  BP: (!) 183/63   Pulse: 82 80  Resp: (!) 24 (!) 28  Temp: 99.3 F (37.4 C)   SpO2: 96% 100%     General exam: Ill looking female, alert awake, on BiPAP. Respiratory system: Increased work of breathing, on BiPAP, coarse breath sound bilateral. Cardiovascular system: S1 & S2 heard, RRR.  Peripheral edema present + gastrointestinal system: Abdomen is nondistended, soft and nontender. Normal bowel sounds heard. Central nervous system: Alert and oriented. No focal neurological deficits. Extremities: Symmetric 5 x 5 power. Skin: No  rashes, lesions or ulcers Psychiatry: Alert awake and oriented. Vascular access: Left upper extremity AV fistula has good thrill and bruit.   Assessment/Plan:  # CKD stage IV with fluid overload/pleural effusion: Patient is in respiratory distress with fluid overload not really responding with IV diuretics.  She is currently on BiPAP with impending need for mechanical ventilation.  Agree with higher dose of Lasix, will add metolazone.  She will need dialysis tonight mainly for ultrafiltration/volume management.  I have discussed this with the patient and she agreed to proceed with HD.  This is likely progressive CKD to new ESRD. Hepatitis lab ordered HD ordered for tonight and it was discussed with dialysis nurse.  Per HD nurse, he has to go to another hospital [facility]) first for emergent dialysis and then he will come here.  AV fistula for the access.  She will need social worker evaluation to arrange outpatient HD.  # Acute respiratory failure with hypoxemia due to RSV pneumonia/fluid overload: On BiPAP, Lasix and plan for dialysis as discussed above.  On empiric antibiotic with ceftriaxone, azithromycin and prednisone.  Pulmonary team is following.  # Acute decompensated diastolic CHF: Volume management with Lasix and then HD.  Continue fluid restriction.  Pending echo.  # Anion gap metabolic acidosis: Now managing with HD.  # Anemia of CKD: With RSV infection.  Check iron studies.  # Hypertension: BP elevated also with the stress/use of BiPAP.  Currently on diuretics and hydralazine as needed.  UF with HD.  Thank you for the consult. I have discussed with the nursing staffs in 6 E.  Dannisha Eckmann Tanna Furry 01/17/2022, 6:28 PM  [facility].

## 2022-01-17 NOTE — Progress Notes (Signed)
Transported pt from 6E29 to 73M11 with RN.  Pt tolerated well

## 2022-01-17 NOTE — ED Provider Notes (Signed)
[place] EMERGENCY DEPT Provider Note   CSN: [id number] Arrival date & time: 01/17/22  0101     History  Chief Complaint  Patient presents with   Shortness of Breath    [name] is a 82 y.o. female.  This patient is an 82 year old female with past medical history of coronary artery disease with stent, status post TAVR, chronic renal insufficiency, prior CVA, hypertension, diabetes, and congestive heart failure.  Patient presenting with complaints of shortness of breath.  This has been worsening since this afternoon.  She describes increased leg swelling and dyspnea on exertion and orthopnea.  She has had some cough which has been nonproductive, but no fever.  She denies chest pain.  She reports being compliant with her torsemide.  The history is provided by the patient.  Shortness of Breath      Home Medications Prior to Admission medications   Medication Sig Start Date End Date Taking? Authorizing Provider  acetaminophen (TYLENOL) 325 MG tablet Take 650 mg by mouth every 6 (six) hours as needed for moderate pain or headache.     [provider]  amLODipine (NORVASC) 10 MG tablet TAKE 1 TABLET BY MOUTH EVERY DAY 11/02/21   Patwardhan, Manish J, MD  aspirin EC 81 MG tablet Take 81 mg by mouth daily.    [provider]  brimonidine (ALPHAGAN) 0.2 % ophthalmic solution Place 1 drop into the left eye daily. 04/19/19   [provider]  ferrous sulfate 325 (65 FE) MG tablet Take 325 mg by mouth daily with breakfast.    [provider]  folic acid (FOLVITE) 1 MG tablet Take 1 mg by mouth daily.    [provider]  HUMALOG KWIKPEN 100 UNIT/ML KwikPen Inject 1-5 Units into the skin See admin instructions. Inject 1-5 units 3 times daily with meals and at bedtime. Dose depends on what pt is eating. 05/28/18   [provider]  hydrALAZINE (APRESOLINE) 100 MG tablet Take 100 mg by mouth 3 (three) times daily. 08/09/21    [provider]  insulin glargine (LANTUS) 100 UNIT/ML injection Inject 0.06 mLs (6 Units total) into the skin at bedtime. 09/14/21   Arrien, Jimmy Picket, MD  isosorbide mononitrate (IMDUR) 120 MG 24 hr tablet Take 2 tablets (240 mg total) by mouth daily. 08/23/21 02/19/22  Patwardhan, Reynold Bowen, MD  labetalol (NORMODYNE) 200 MG tablet TAKE 1 TABLET BY MOUTH TWICE A DAY 11/02/21   Patwardhan, Manish J, MD  REPATHA SURECLICK 694 MG/ML SOAJ INJECT 1 ML INTO THE SKIN EVERY 14 (FOURTEEN) DAYS. 02/05/21   Patwardhan, Reynold Bowen, MD  sodium zirconium cyclosilicate (LOKELMA) 10 g PACK packet Take 10 g by mouth daily as needed (high potassium).    [provider]  torsemide (DEMADEX) 20 MG tablet Take 4 tablets (80 mg total) by mouth daily. 09/14/21 10/14/21  Arrien, Jimmy Picket, MD  venlafaxine XR (EFFEXOR-XR) 150 MG 24 hr capsule Take 1 capsule (150 mg total) by mouth daily. 02/27/20   Bary Leriche, PA-C      Allergies    Patient has no known allergies.    Review of Systems   Review of Systems  Respiratory:  Positive for shortness of breath.   All other systems reviewed and are negative.   Physical Exam Updated Vital Signs BP (!) 154/62   Pulse 83   Temp (!) 101.5 F (38.6 C) (Oral)   Resp (!) 24   Wt 74.1 kg   SpO2 94%  BMI 29.40 kg/m  Physical Exam Vitals and nursing note reviewed.  Constitutional:      Appearance: She is well-developed. She is not diaphoretic.     Comments: Patient is awake and alert.  She is in moderate respiratory distress.  HENT:     Head: Normocephalic and atraumatic.  Cardiovascular:     Rate and Rhythm: Normal rate and regular rhythm.     Heart sounds: No murmur heard.    No friction rub. No gallop.  Pulmonary:     Effort: Tachypnea, accessory muscle usage and respiratory distress present.     Breath sounds: Examination of the right-middle field reveals rales. Examination of the left-middle field reveals rales. Examination of the  right-lower field reveals rales. Examination of the left-lower field reveals rales. Rales present. No wheezing.  Abdominal:     General: Bowel sounds are normal. There is no distension.     Palpations: Abdomen is soft.     Tenderness: There is no abdominal tenderness.  Musculoskeletal:        General: Normal range of motion.     Cervical back: Normal range of motion and neck supple.  Skin:    General: Skin is warm and dry.  Neurological:     General: No focal deficit present.     Mental Status: She is alert and oriented to person, place, and time.     ED Results / Procedures / Treatments   Labs (all labs ordered are listed, but only abnormal results are displayed) Labs Reviewed  RESP PANEL BY RT-PCR (RSV, FLU A&B, COVID)  [id number]  CULTURE, BLOOD (ROUTINE X 2)  CULTURE, BLOOD (ROUTINE X 2)  CBC WITH DIFFERENTIAL/PLATELET  COMPREHENSIVE METABOLIC PANEL  BRAIN NATRIURETIC PEPTIDE  LACTIC ACID, PLASMA  LACTIC ACID, PLASMA  TROPONIN I (HIGH SENSITIVITY)    EKG EKG Interpretation  Date/Time:  Monday January 17 2022 01:53:48 EST Ventricular Rate:  74 PR Interval:  200 QRS Duration: 98 QT Interval:  470 QTC Calculation: 522 R Axis:   28 Text Interpretation: Sinus rhythm Low voltage, precordial leads Nonspecific repol abnormality, lateral leads Prolonged QT interval No significant change since 12/20/2021 Confirmed by Veryl Speak [id number]) on 01/17/2022 6:38:36 AM  Radiology No results found.  Procedures Procedures    Medications Ordered in ED Medications - No data to display  ED Course/ Medical Decision Making/ A&P  Patient is an 82 year old female with extensive past medical history as outlined in the HPI.  Patient presents with complaints of shortness of breath that became worse this evening.  Patient arrives here febrile with temp of 101.5 and an moderate respiratory distress.  Oxygen saturations initially 87% on room air.  On exam, patient appears anxious and tachypneic.   She is using accessory muscles to breathe.  Workup initiated including CBC, basic metabolic panel, troponin, lactate, and BNP.  White count is 15.7, but CBC otherwise consistent with baseline.  Metabolic panel unremarkable.  BNP is elevated at 2500.  Nasal swab test positive for RSV.  Chest x-ray obtained showing increasing airspace consolidation in the right base with an associated enlarging effusion and what appears to be a new left perihilar infiltrate.  Patient will be given Rocephin and Zithromax for this.    She was also given 2 doses of Lasix with minimal diuresis for what appears to be an exacerbation of congestive heart failure.  Etiology of her dyspnea appears multifactorial.  I suspect an element of congestive heart failure, but she is febrile and has a  positive RSV swab.  She also has what appears to be a lobar infiltrate.  Shortly after arriving in the ER, patient was placed on BiPAP and this made her feel much more comfortable.  Care discussed with Dr. Nevada Crane from the hospitalist service.  She is willing to admit to stepdown.  I have also discussed with Dr. Elsworth Soho from critical care who is aware of the patient and will become involved as necessary if the patient decompensates and requires higher level of care.  CRITICAL CARE Performed by: Veryl Speak Total critical care time: 45 minutes Critical care time was exclusive of separately billable procedures and treating other patients. Critical care was necessary to treat or prevent imminent or life-threatening deterioration. Critical care was time spent personally by me on the following activities: development of treatment plan with patient and/or surrogate as well as nursing, discussions with consultants, evaluation of patient's response to treatment, examination of patient, obtaining history from patient or surrogate, ordering and performing treatments and interventions, ordering and review of laboratory studies, ordering and review of  radiographic studies, pulse oximetry and re-evaluation of patient's condition.   Final Clinical Impression(s) / ED Diagnoses Final diagnoses:  None    Rx / DC Orders ED Discharge Orders     None         Veryl Speak, MD 01/17/22 [phone]

## 2022-01-17 NOTE — Consult Note (Addendum)
NAME:  [name], MRN:  [id number], DOB:  [birth year], LOS: 0 ADMISSION DATE:  01/17/2022, CONSULTATION DATE:  01/17/22 REFERRING MD:  Dr. Eliseo Squires, CHIEF COMPLAINT:  SOB   History of Present Illness:  HPI obtained from EMR and Dr. Eliseo Squires at bedside as patient remains in respiratory distress on BiPAP.   82 year old female with PMH as below who presented to [facility] with one day history of weakness, cough, and SOB for 1 day in addition to increasing leg swelling, exertional dyspnea, and orthopnea despite compliance with home torsemide.  Found to be hypoxic and febrile, RSV positive and in heart failure exacerbation.  Placed on BiPAP and given lasix 40mg  x 2 and was transferred and admitted to [facility] by [facility].  On arrival, patient with significant respiratory distress on BiPAP, therefore PCCM consulted.   Pertinent  Medical History  HFpEF, AS s/p TAVR 2020, DM, HTN, CKD IV (followed by Dr. Hollie Salk with AVF LUE not on HD yet), GERD, CVA, neuropathy, HLD  Significant Hospital Events: Including procedures, antibiotic start and stop dates in addition to other pertinent events   1/1 admitted, HF exacerbation, resp failure, RSV  Interim History / Subjective:    Objective   Blood pressure (!) 183/63, pulse 80, temperature 99.3 F (37.4 C), temperature source Axillary, resp. rate (!) 28, weight 74.1 kg, SpO2 100 %.    Vent Mode: PCV;BIPAP FiO2 (%):  [40 %] 40 % Set Rate:  [15 bmp] 15 bmp PEEP:  [5 cmH20] 5 cmH20   Intake/Output Summary (Last 24 hours) at 01/17/2022 1731 Last data filed at 01/17/2022 1410 Gross per 24 hour  Intake 100 ml  Output 400 ml  Net -300 ml   Filed Weights   01/17/22 0114  Weight: 74.1 kg   Examination: General:  Acute on chronically ill appearing elderly female sitting upright in bed on BIPAP in resp distress HEENT: full face mask, JVD to jaw Neuro: Alert but seems tired, f/c, MAE CV: rr PULM:  labored and tachypneic on BiPAP 12/6 with TV 400-500, diffuse rales/ rhonchi GI:  soft, bs+, NT Extremities: warm/dry, non pitting LE edema, LUE AVF Skin: no rashes  CXR > airspace consolidation right base, right effusion, new left perihilar infiltrate   Labs> K 4.4, bicarb 18, BUN/ sCr 60/3.75, normal LFTs, BNP 2532, trop hs 46> 48, lactic 1 (0300), WBC 15.7, H/H 9.8/ 30.2  Resolved Hospital Problem list    Assessment & Plan:   Acute hypoxic respiratory failure in the setting of RSV, HF exacerbation and volume overload, and rule out bacterial superinfection - tx to ICU - high risk for intubation> pt agreeable if needed - cont BiPAP  - NPO - lasix 120mg  IV now and nephrology consulted for emergent iHD for volume removal to avoid intubation  - getting solumedrol 125mg  IV now and continue steroids daily  - BD's - pending PCT.  Would cont with abx for now given tenuous resp status and deescalate/ stop as able - check MRSA PCR - supportive care/ aggressive pulm hygiene  - droplet precautions   Acute on chronic diastolic HF HTN Prolonged QTc - lasix now and nephrology consulted for hopeful iHD tonight as BP will tolerate and has AVF  - tele monitor - prn hydralazine  - trop hs trend flat thus far, likely stress demand> 46> 48 - hold home norvasc, hydralazine, imdur, labetalol torsemide  - avoid Qtc prolonging meds, maximize electrolytes- K, Mag, Ca   CKD IV, still makes some urine  -  Nephrology consult - strict I/O's, daily wts - trend renal indices    Chronic normocytic anemia - H/H stable - trend CBC, transfuse for Hgb < 7   DM - SSI - goal CBG 140-180 - probably will need to add levemir    Leukocytosis - follow BC - pending PCT - empiric abx for now, CAP coverage   Best Practice (right click and "Reselect all SmartList Selections" daily)   Diet/type: NPO DVT prophylaxis: prophylactic heparin  GI prophylaxis: PPI Lines: N/A Foley:  N/A Code Status:  full code> confirmed by patient  Last date of multidisciplinary goals of care  discussion [1/1]  Labs   CBC: Recent Labs  Lab 01/17/22 0117  WBC 15.7*  NEUTROABS 12.5*  HGB 9.8*  HCT 30.2*  MCV 84.4  PLT 283    Basic Metabolic Panel: Recent Labs  Lab 01/17/22 0117  NA 135  K 4.4  CL 100  CO2 18*  GLUCOSE 206*  BUN 60*  CREATININE 3.75*  CALCIUM 9.0   GFR: Estimated Creatinine Clearance: 10.8 mL/min (A) (by C-G formula based on SCr of 3.75 mg/dL (H)). Recent Labs  Lab 01/17/22 0117 01/17/22 0324  WBC 15.7*  --   LATICACIDVEN  --  1.0    Liver Function Tests: Recent Labs  Lab 01/17/22 0117  AST 22  ALT 9  ALKPHOS 84  BILITOT 0.9  PROT 7.0  ALBUMIN 3.7   No results for input(s): "LIPASE", "AMYLASE" in the last 168 hours. No results for input(s): "AMMONIA" in the last 168 hours.  ABG    Component Value Date/Time   PHART 7.41 01/17/2022 1700   PCO2ART 32 01/17/2022 1700   PO2ART 88 01/17/2022 1700   HCO3 20.3 01/17/2022 1700   TCO2 23 11/13/2018 1002   ACIDBASEDEF 3.4 (H) 01/17/2022 1700   O2SAT 98.4 01/17/2022 1700     Coagulation Profile: No results for input(s): "INR", "PROTIME" in the last 168 hours.  Cardiac Enzymes: No results for input(s): "CKTOTAL", "CKMB", "CKMBINDEX", "TROPONINI" in the last 168 hours.  HbA1C: Hgb A1c MFr Bld  Date/Time Value Ref Range Status  09/07/2021 11:43 PM 7.2 (H) 4.8 - 5.6 % Final    Comment:    (NOTE) Pre diabetes:          5.7%-6.4%  Diabetes:              >6.4%  Glycemic control for   <7.0% adults with diabetes   05/11/2020 09:05 PM 7.3 (H) 4.8 - 5.6 % Final    Comment:    (NOTE) Pre diabetes:          5.7%-6.4%  Diabetes:              >6.4%  Glycemic control for   <7.0% adults with diabetes     CBG: Recent Labs  Lab 01/17/22 1542 01/17/22 1656  GLUCAP 171* 193*    Review of Systems:   Unable   Past Medical History:  She,  has a past medical history of Arthritis, Cerebrovascular disease, CHF (congestive heart failure) ([place]), CKD (chronic kidney disease),  Coronary artery disease, Depression, Diabetes ([place]), Diabetic peripheral neuropathy ([place]), Diabetic retinopathy ([place]), Diverticulitis, GERD (gastroesophageal reflux disease), History of CVA (cerebrovascular accident), Hyperlipidemia, Hypertension, Obesity, Renal lesion, S/P TAVR (transcatheter aortic valve replacement) (11/13/2018), and Severe aortic stenosis.   Surgical History:   Past Surgical History:  Procedure Laterality Date   ABDOMINAL HYSTERECTOMY     AV FISTULA PLACEMENT Left 12/18/2017   Procedure: ARTERIOVENOUS (AV)  FISTULA CREATION ARM;  Surgeon: Marty Heck, MD;  Location: [facility] OR;  Service: Vascular;  Laterality: Left;   bilateral cataract surgery     CARDIAC CATHETERIZATION  01/07/2014   DR Einar Gip   COLON RESECTION  02/1999   COLONOSCOPY     COLOSTOMY  02/1999   COLOSTOMY CLOSURE  07/1999   EYE SURGERY Left 2019   FISTULA SUPERFICIALIZATION Left 06/29/2018   Procedure: FISTULA SUPERFICIALIZATION LEFT BRACHIOCEPHALIC;  Surgeon: Marty Heck, MD;  Location: [place];  Service: Vascular;  Laterality: Left;   LEFT HEART CATHETERIZATION WITH CORONARY ANGIOGRAM N/A 01/07/2014   Procedure: LEFT HEART CATHETERIZATION WITH CORONARY ANGIOGRAM;  Surgeon: Laverda Page, MD;  Location: [facility] CATH LAB;  Service: Cardiovascular;  Laterality: N/A;   LOOP RECORDER INSERTION N/A 02/17/2020   Procedure: LOOP RECORDER INSERTION;  Surgeon: Deboraha Sprang, MD;  Location: [place] CV LAB;  Service: Cardiovascular;  Laterality: N/A;   middle cerebral artery stent placement Right    OTHER SURGICAL HISTORY     laser surgery   PTCA  01/07/2014   DES to RCA    DR Einar Gip   right knee surgery Right    for infection   RIGHT/LEFT HEART CATH AND CORONARY ANGIOGRAPHY N/A 10/23/2018   Procedure: RIGHT/LEFT HEART CATH AND CORONARY ANGIOGRAPHY;  Surgeon: Nigel Mormon, MD;  Location: [place] CV LAB;  Service: Cardiovascular;  Laterality: N/A;   TEE WITHOUT CARDIOVERSION N/A 11/13/2018    Procedure: TRANSESOPHAGEAL ECHOCARDIOGRAM (TEE);  Surgeon: Burnell Blanks, MD;  Location: [place];  Service: Open Heart Surgery;  Laterality: N/A;   TRANSCATHETER AORTIC VALVE REPLACEMENT, TRANSFEMORAL  11/13/2018   TRANSCATHETER AORTIC VALVE REPLACEMENT, TRANSFEMORAL N/A 11/13/2018   Procedure: TRANSCATHETER AORTIC VALVE REPLACEMENT, TRANSFEMORAL;  Surgeon: Burnell Blanks, MD;  Location: [place];  Service: Open Heart Surgery;  Laterality: N/A;     Social History:   reports that she has never smoked. She has never used smokeless tobacco. She reports that she does not drink alcohol and does not use drugs.   Family History:  Her family history includes Diabetes in her mother; Heart disease in her mother; Hypertension in her brother; Prostate cancer in her brother and father.   Allergies No Known Allergies   Home Medications  Prior to Admission medications   Medication Sig Start Date End Date Taking? Authorizing Provider  acetaminophen (TYLENOL) 325 MG tablet Take 650 mg by mouth every 6 (six) hours as needed for moderate pain or headache.     [provider]  amLODipine (NORVASC) 10 MG tablet TAKE 1 TABLET BY MOUTH EVERY DAY 11/02/21   Patwardhan, Manish J, MD  aspirin EC 81 MG tablet Take 81 mg by mouth daily.    [provider]  brimonidine (ALPHAGAN) 0.2 % ophthalmic solution Place 1 drop into the left eye daily. 04/19/19   [provider]  ferrous sulfate 325 (65 FE) MG tablet Take 325 mg by mouth daily with breakfast.    [provider]  folic acid (FOLVITE) 1 MG tablet Take 1 mg by mouth daily.    [provider]  HUMALOG KWIKPEN 100 UNIT/ML KwikPen Inject 1-5 Units into the skin See admin instructions. Inject 1-5 units 3 times daily with meals and at bedtime. Dose depends on what pt is eating. 05/28/18   [provider]  hydrALAZINE (APRESOLINE) 100 MG tablet Take 100 mg by mouth 3 (three) times daily. 08/09/21    [provider]  insulin glargine (  LANTUS) 100 UNIT/ML injection Inject 0.06 mLs (6 Units total) into the skin at bedtime. 09/14/21   Arrien, Jimmy Picket, MD  isosorbide mononitrate (IMDUR) 120 MG 24 hr tablet Take 2 tablets (240 mg total) by mouth daily. 08/23/21 02/19/22  Patwardhan, Reynold Bowen, MD  labetalol (NORMODYNE) 200 MG tablet TAKE 1 TABLET BY MOUTH TWICE A DAY 11/02/21   Patwardhan, Manish J, MD  REPATHA SURECLICK 809 MG/ML SOAJ INJECT 1 ML INTO THE SKIN EVERY 14 (FOURTEEN) DAYS. 02/05/21   Patwardhan, Reynold Bowen, MD  sodium zirconium cyclosilicate (LOKELMA) 10 g PACK packet Take 10 g by mouth daily as needed (high potassium).    [provider]  torsemide (DEMADEX) 20 MG tablet Take 4 tablets (80 mg total) by mouth daily. 09/14/21 10/14/21  Arrien, Jimmy Picket, MD  venlafaxine XR (EFFEXOR-XR) 150 MG 24 hr capsule Take 1 capsule (150 mg total) by mouth daily. 02/27/20   Bary Leriche, PA-C     Critical care time: 34 mins     Kennieth Rad, [place] [facility] 01/17/2022, 5:31 PM  See Amion for pager If no response to pager, please call PCCM consult pager After 7:00 pm call Elink

## 2022-01-17 NOTE — ED Notes (Signed)
RT at bedside.

## 2022-01-18 ENCOUNTER — Inpatient Hospital Stay (HOSPITAL_COMMUNITY): Payer: Medicare PPO

## 2022-01-18 DIAGNOSIS — I5031 Acute diastolic (congestive) heart failure: Secondary | ICD-10-CM | POA: Diagnosis not present

## 2022-01-18 DIAGNOSIS — J189 Pneumonia, unspecified organism: Secondary | ICD-10-CM

## 2022-01-18 DIAGNOSIS — B338 Other specified viral diseases: Secondary | ICD-10-CM

## 2022-01-18 DIAGNOSIS — E1122 Type 2 diabetes mellitus with diabetic chronic kidney disease: Secondary | ICD-10-CM

## 2022-01-18 DIAGNOSIS — J9601 Acute respiratory failure with hypoxia: Secondary | ICD-10-CM | POA: Diagnosis not present

## 2022-01-18 DIAGNOSIS — Z794 Long term (current) use of insulin: Secondary | ICD-10-CM

## 2022-01-18 DIAGNOSIS — J121 Respiratory syncytial virus pneumonia: Secondary | ICD-10-CM

## 2022-01-18 DIAGNOSIS — N185 Chronic kidney disease, stage 5: Secondary | ICD-10-CM

## 2022-01-18 LAB — COMPREHENSIVE METABOLIC PANEL
ALT: 12 U/L (ref 0–44)
AST: 22 U/L (ref 15–41)
Albumin: 2.5 g/dL — ABNORMAL LOW (ref 3.5–5.0)
Alkaline Phosphatase: 71 U/L (ref 38–126)
Anion gap: 16 — ABNORMAL HIGH (ref 5–15)
BUN: 66 mg/dL — ABNORMAL HIGH (ref 8–23)
CO2: 19 mmol/L — ABNORMAL LOW (ref 22–32)
Calcium: 8.8 mg/dL — ABNORMAL LOW (ref 8.9–10.3)
Chloride: 102 mmol/L (ref 98–111)
Creatinine, Ser: 3.92 mg/dL — ABNORMAL HIGH (ref 0.44–1.00)
GFR, Estimated: 11 mL/min — ABNORMAL LOW (ref >60)
Glucose, Bld: 153 mg/dL — ABNORMAL HIGH (ref 70–99)
Potassium: 3.4 mmol/L — ABNORMAL LOW (ref 3.5–5.1)
Sodium: 137 mmol/L (ref 135–145)
Total Bilirubin: 0.5 mg/dL (ref 0.3–1.2)
Total Protein: 6.2 g/dL — ABNORMAL LOW (ref 6.5–8.1)

## 2022-01-18 LAB — CBC
HCT: 30 % — ABNORMAL LOW (ref 36.0–46.0)
Hemoglobin: 9.7 g/dL — ABNORMAL LOW (ref 12.0–15.0)
MCH: 27.3 pg (ref 26.0–34.0)
MCHC: 32.3 g/dL (ref 30.0–36.0)
MCV: 84.5 fL (ref 80.0–100.0)
Platelets: 297 10*3/uL (ref 150–400)
RBC: 3.55 MIL/uL — ABNORMAL LOW (ref 3.87–5.11)
RDW: 15.9 % — ABNORMAL HIGH (ref 11.5–15.5)
WBC: 11.4 10*3/uL — ABNORMAL HIGH (ref 4.0–10.5)
nRBC: 0 % (ref 0.0–0.2)

## 2022-01-18 LAB — ECHOCARDIOGRAM COMPLETE
AR max vel: 1.98 cm2
AV Area VTI: 1.86 cm2
AV Area mean vel: 1.94 cm2
AV Mean grad: 12 mmHg
AV Peak grad: 20.9 mmHg
Ao pk vel: 2.28 m/s
Area-P 1/2: 4.11 cm2
MV M vel: 4.86 m/s
MV Peak grad: 94.5 mmHg
S' Lateral: 3.4 cm
Weight: 2613.77 oz

## 2022-01-18 LAB — GLUCOSE, CAPILLARY
Glucose-Capillary: 137 mg/dL — ABNORMAL HIGH (ref 70–99)
Glucose-Capillary: 128 mg/dL — ABNORMAL HIGH (ref 70–99)
Glucose-Capillary: 145 mg/dL — ABNORMAL HIGH (ref 70–99)
Glucose-Capillary: 266 mg/dL — ABNORMAL HIGH (ref 70–99)
Glucose-Capillary: 178 mg/dL — ABNORMAL HIGH (ref 70–99)

## 2022-01-18 LAB — IRON AND TIBC
Iron: 11 ug/dL — ABNORMAL LOW (ref 28–170)
Saturation Ratios: 5 % — ABNORMAL LOW (ref 10.4–31.8)
TIBC: 218 ug/dL — ABNORMAL LOW (ref 250–450)
UIBC: 207 ug/dL

## 2022-01-18 LAB — MRSA NEXT GEN BY PCR, NASAL: MRSA by PCR Next Gen: NOT DETECTED

## 2022-01-18 LAB — MAGNESIUM: Magnesium: 2 mg/dL (ref 1.7–2.4)

## 2022-01-18 LAB — FERRITIN: Ferritin: 138 ng/mL (ref 11–307)

## 2022-01-18 LAB — PROCALCITONIN: Procalcitonin: 11.46 ng/mL

## 2022-01-18 MED ORDER — CHLORHEXIDINE GLUCONATE CLOTH 2 % EX PADS
6.0000 | MEDICATED_PAD | Freq: Every day | CUTANEOUS | Status: DC
  Administered 2022-01-18 – 2022-01-24 (×5): 6 via TOPICAL

## 2022-01-18 MED ORDER — SODIUM CHLORIDE 0.9 % IV SOLN: INTRAVENOUS | Status: DC | PRN

## 2022-01-18 MED ORDER — SODIUM CHLORIDE 0.9 % IV SOLN
2.0000 g | INTRAVENOUS | Status: DC
  Administered 2022-01-18 – 2022-01-20 (×3): 2 g via INTRAVENOUS
  Filled 2022-01-18 (×3): qty 20

## 2022-01-18 MED ORDER — AMLODIPINE BESYLATE 10 MG PO TABS
10.0000 mg | ORAL_TABLET | Freq: Every day | ORAL | Status: DC
  Administered 2022-01-18 – 2022-01-22 (×4): 10 mg via ORAL
  Filled 2022-01-18 (×5): qty 1

## 2022-01-18 MED ORDER — VENLAFAXINE HCL ER 150 MG PO CP24
150.0000 mg | ORAL_CAPSULE | Freq: Every day | ORAL | Status: DC
  Administered 2022-01-18 – 2022-01-22 (×4): 150 mg via ORAL
  Filled 2022-01-18 (×6): qty 1

## 2022-01-18 MED ORDER — HYDRALAZINE HCL 50 MG PO TABS
100.0000 mg | ORAL_TABLET | Freq: Three times a day (TID) | ORAL | Status: DC
  Administered 2022-01-18 – 2022-01-24 (×16): 100 mg via ORAL
  Filled 2022-01-18 (×16): qty 2

## 2022-01-18 MED ORDER — GUAIFENESIN-DM 100-10 MG/5ML PO SYRP
5.0000 mL | ORAL_SOLUTION | ORAL | Status: DC | PRN
  Administered 2022-01-18 – 2022-01-23 (×6): 5 mL via ORAL
  Filled 2022-01-18 (×6): qty 5

## 2022-01-18 NOTE — Progress Notes (Signed)
Requested to see pt for out-pt HD needs at d/c. Met with pt and pt's daughter at bedside. Introduced self and explained role. Pt prefers [place] on [address] if possible. Referral made to [facility] admissions today. Pt's family will likley transport pt to HD appts but pt/family have requested transportation resources if possible. Contacted RN CM and CSW with pt's request for resources. Will assist as needed.   Melven Sartorius Renal Navigator [phone]

## 2022-01-18 NOTE — Progress Notes (Signed)
  Echocardiogram 2D Echocardiogram has been performed.  Wynelle Link 01/18/2022, 9:59 AM

## 2022-01-18 NOTE — Progress Notes (Signed)
NAME:  [name], MRN:  [id number], DOB:  [birth year], LOS: 1 ADMISSION DATE:  01/17/2022, CONSULTATION DATE:  01/17/22 REFERRING MD:  Dr. Eliseo Squires, CHIEF COMPLAINT:  SOB   History of Present Illness:  HPI obtained from EMR and Dr. Eliseo Squires at bedside as patient remains in respiratory distress on BiPAP.   82 year old female with PMH as below who presented to [facility] with one day history of weakness, cough, and SOB for 1 day in addition to increasing leg swelling, exertional dyspnea, and orthopnea despite compliance with home torsemide.  Found to be hypoxic and febrile, RSV positive and in heart failure exacerbation.  Placed on BiPAP and given lasix 40mg  x 2 and was transferred and admitted to [facility] by [facility].  On arrival, patient with significant respiratory distress on BiPAP, therefore PCCM consulted.   Pertinent  Medical History  HFpEF, AS s/p TAVR 2020, DM, HTN, CKD IV (followed by Dr. Hollie Salk with AVF LUE not on HD yet), GERD, CVA, neuropathy, HLD  Significant Hospital Events: Including procedures, antibiotic start and stop dates in addition to other pertinent events   1/1 admitted, HF exacerbation, resp failure, RSV  Interim History / Subjective:  Got HD overnight. 2L off for first treatment. Remains on BiPAP. No complaints. Tol HD well.   Objective   Blood pressure (!) 184/75, pulse 77, temperature 97.7 F (36.5 C), resp. rate 16, weight 74.1 kg, SpO2 98 %.    FiO2 (%):  [40 %-50 %] 40 %   Intake/Output Summary (Last 24 hours) at 01/18/2022 0756 Last data filed at 01/18/2022 8756 Gross per 24 hour  Intake 100.15 ml  Output 2800 ml  Net -2699.85 ml    Filed Weights   01/17/22 0114  Weight: 74.1 kg   Examination: General:  Thin elderly appearing female in NAD HEENT: BiPAP in place. JVD still noted.  Neuro: Alert, oriented, non-focal CV: RRR, no MRG PULM:  Bibasilar crackles persist. No distress on BiPAP.  GI: Soft, NT ,ND Extremities: No acute deformity. Trace pretibial edema bilaterally.   Skin: no rashes   Resolved Hospital Problem list    Assessment & Plan:   Acute hypoxic respiratory failure in the setting of RSV, HF exacerbation and volume overload, and rule out bacterial superinfection - Will trial off BiPAP after neb tx finished.  - NPO continue - Continue steroids - Nebulized bronchodilators continue - HD for volume removal, did not respond well to diuretics.  - Procalcitonin 11: continue CAP coverage - check MRSA PCR > pending - supportive care/ aggressive pulm hygiene  - droplet precautions  Acute on chronic diastolic HF HTN Prolonged QTc - tele monitor - prn hydralazine  - restart home norvasc, hydralazine, - continue to hold  imdur, labetalol, can restart if above is ineffective.  - avoid Qtc prolonging meds, maximize electrolytes- K, Mag, Ca - Repeat EKG to assess QTC  CKD IV > acute volume overload worrisome for progression to ESRD - Nephrology following - HD just finished up this morning. First tx so only 2 L removed.  - strict I/O's, daily wts - trend renal indices   Chronic normocytic anemia - H/H stable - trend CBC, transfuse for Hgb < 7  DM - SSI - goal CBG 140-180 - probably will need to add levemir    Best Practice (right click and "Reselect all SmartList Selections" daily)   Diet/type: NPO DVT prophylaxis: prophylactic heparin  GI prophylaxis: PPI Lines: N/A Foley:  N/A Code Status:  full code> confirmed  by patient  Last date of multidisciplinary goals of care discussion [1/1]  Labs   CBC: Recent Labs  Lab 01/17/22 0117 01/18/22 0333  WBC 15.7* 11.4*  NEUTROABS 12.5*  --   HGB 9.8* 9.7*  HCT 30.2* 30.0*  MCV 84.4 84.5  PLT 291 297     Basic Metabolic Panel: Recent Labs  Lab 01/17/22 0117 01/18/22 0333  NA 135 137  K 4.4 3.4*  CL 100 102  CO2 18* 19*  GLUCOSE 206* 153*  BUN 60* 66*  CREATININE 3.75* 3.92*  CALCIUM 9.0 8.8*  MG  --  2.0    GFR: Estimated Creatinine Clearance: 10.4 mL/min (A) (by  C-G formula based on SCr of 3.92 mg/dL (H)). Recent Labs  Lab 01/17/22 0117 01/17/22 0324 01/17/22 1748 01/18/22 0333  PROCALCITON  --   --  8.74 11.46  WBC 15.7*  --   --  11.4*  LATICACIDVEN  --  1.0  --   --      Liver Function Tests: Recent Labs  Lab 01/17/22 0117 01/18/22 0333  AST 22 22  ALT 9 12  ALKPHOS 84 71  BILITOT 0.9 0.5  PROT 7.0 6.2*  ALBUMIN 3.7 2.5*    No results for input(s): "LIPASE", "AMYLASE" in the last 168 hours. No results for input(s): "AMMONIA" in the last 168 hours.  ABG    Component Value Date/Time   PHART 7.41 01/17/2022 1700   PCO2ART 32 01/17/2022 1700   PO2ART 88 01/17/2022 1700   HCO3 20.3 01/17/2022 1700   TCO2 23 11/13/2018 1002   ACIDBASEDEF 3.4 (H) 01/17/2022 1700   O2SAT 98.4 01/17/2022 1700     Coagulation Profile: No results for input(s): "INR", "PROTIME" in the last 168 hours.  Cardiac Enzymes: No results for input(s): "CKTOTAL", "CKMB", "CKMBINDEX", "TROPONINI" in the last 168 hours.  HbA1C: Hgb A1c MFr Bld  Date/Time Value Ref Range Status  09/07/2021 11:43 PM 7.2 (H) 4.8 - 5.6 % Final    Comment:    (NOTE) Pre diabetes:          5.7%-6.4%  Diabetes:              >6.4%  Glycemic control for   <7.0% adults with diabetes   05/11/2020 09:05 PM 7.3 (H) 4.8 - 5.6 % Final    Comment:    (NOTE) Pre diabetes:          5.7%-6.4%  Diabetes:              >6.4%  Glycemic control for   <7.0% adults with diabetes     CBG: Recent Labs  Lab 01/17/22 1656 01/17/22 1944 01/17/22 2339 01/18/22 0333 01/18/22 0726  GLUCAP 193* 177* 160* 137* 128*     Review of Systems:   Unable   Past Medical History:  She,  has a past medical history of Arthritis, Cerebrovascular disease, CHF (congestive heart failure) ([place]), CKD (chronic kidney disease), Coronary artery disease, Depression, Diabetes ([place]), Diabetic peripheral neuropathy ([place]), Diabetic retinopathy ([place]), Diverticulitis, GERD (gastroesophageal reflux  disease), History of CVA (cerebrovascular accident), Hyperlipidemia, Hypertension, Obesity, Renal lesion, S/P TAVR (transcatheter aortic valve replacement) (11/13/2018), and Severe aortic stenosis.   Surgical History:   Past Surgical History:  Procedure Laterality Date   ABDOMINAL HYSTERECTOMY     AV FISTULA PLACEMENT Left 12/18/2017   Procedure: ARTERIOVENOUS (AV) FISTULA CREATION ARM;  Surgeon: Marty Heck, MD;  Location: [place] OR;  Service: Vascular;  Laterality: Left;   bilateral cataract surgery  CARDIAC CATHETERIZATION  01/07/2014   DR Einar Gip   COLON RESECTION  02/1999   COLONOSCOPY     COLOSTOMY  02/1999   COLOSTOMY CLOSURE  07/1999   EYE SURGERY Left 2019   FISTULA SUPERFICIALIZATION Left 06/29/2018   Procedure: FISTULA SUPERFICIALIZATION LEFT BRACHIOCEPHALIC;  Surgeon: Marty Heck, MD;  Location: [place];  Service: Vascular;  Laterality: Left;   LEFT HEART CATHETERIZATION WITH CORONARY ANGIOGRAM N/A 01/07/2014   Procedure: LEFT HEART CATHETERIZATION WITH CORONARY ANGIOGRAM;  Surgeon: Laverda Page, MD;  Location[facility] C[facility] LAB;  Service: Cardiovascular;  Laterality: N/A;   LOOP RECORDER INSERTION N/A 02/17/2020   Procedure: LOOP RECORDER INSERTION;  Surgeon: Deboraha Sprang, MD;  Location: [place] CV LAB;  Service: Cardiovascular;  Laterality: N/A;   middle cerebral artery stent placement Right    OTHER SURGICAL HISTORY     laser surgery   PTCA  01/07/2014   DES to RCA    DR Einar Gip   right knee surgery Right    for infection   RIGHT/LEFT HEART CATH AND CORONARY ANGIOGRAPHY N/A 10/23/2018   Procedure: RIGHT/LEFT HEART CATH AND CORONARY ANGIOGRAPHY;  Surgeon: Nigel Mormon, MD;  Location: [place] CV LAB;  Service: Cardiovascular;  Laterality: N/A;   TEE WITHOUT CARDIOVERSION N/A 11/13/2018   Procedure: TRANSESOPHAGEAL ECHOCARDIOGRAM (TEE);  Surgeon: Burnell Blanks, MD;  Location: [place];  Service: Open Heart Surgery;  Laterality: N/A;    TRANSCATHETER AORTIC VALVE REPLACEMENT, TRANSFEMORAL  11/13/2018   TRANSCATHETER AORTIC VALVE REPLACEMENT, TRANSFEMORAL N/A 11/13/2018   Procedure: TRANSCATHETER AORTIC VALVE REPLACEMENT, TRANSFEMORAL;  Surgeon: Burnell Blanks, MD;  Location: [place];  Service: Open Heart Surgery;  Laterality: N/A;     Social History:   reports that she has never smoked. She has never used smokeless tobacco. She reports that she does not drink alcohol and does not use drugs.   Family History:  Her family history includes Diabetes in her mother; Heart disease in her mother; Hypertension in her brother; Prostate cancer in her brother and father.   Allergies No Known Allergies   Home Medications  Prior to Admission medications   Medication Sig Start Date End Date Taking? Authorizing Provider  acetaminophen (TYLENOL) 325 MG tablet Take 650 mg by mouth every 6 (six) hours as needed for moderate pain or headache.     [provider]  amLODipine (NORVASC) 10 MG tablet TAKE 1 TABLET BY MOUTH EVERY DAY 11/02/21   Patwardhan, Manish J, MD  aspirin EC 81 MG tablet Take 81 mg by mouth daily.    [provider]  brimonidine (ALPHAGAN) 0.2 % ophthalmic solution Place 1 drop into the left eye daily. 04/19/19   [provider]  ferrous sulfate 325 (65 FE) MG tablet Take 325 mg by mouth daily with breakfast.    [provider]  folic acid (FOLVITE) 1 MG tablet Take 1 mg by mouth daily.    [provider]  HUMALOG KWIKPEN 100 UNIT/ML KwikPen Inject 1-5 Units into the skin See admin instructions. Inject 1-5 units 3 times daily with meals and at bedtime. Dose depends on what pt is eating. 05/28/18   [provider]  hydrALAZINE (APRESOLINE) 100 MG tablet Take 100 mg by mouth 3 (three) times daily. 08/09/21   [provider]  insulin glargine (LANTUS) 100 UNIT/ML injection Inject 0.06 mLs (6 Units total) into the skin at bedtime. 09/14/21   Arrien, Jimmy Picket,  MD  isosorbide mononitrate (IMDUR) 120 MG  24 hr tablet Take 2 tablets (240 mg total) by mouth daily. 08/23/21 02/19/22  Patwardhan, Reynold Bowen, MD  labetalol (NORMODYNE) 200 MG tablet TAKE 1 TABLET BY MOUTH TWICE A DAY 11/02/21   Patwardhan, Manish J, MD  REPATHA SURECLICK 338 MG/ML SOAJ INJECT 1 ML INTO THE SKIN EVERY 14 (FOURTEEN) DAYS. 02/05/21   Patwardhan, Reynold Bowen, MD  sodium zirconium cyclosilicate (LOKELMA) 10 g PACK packet Take 10 g by mouth daily as needed (high potassium).    [provider]  torsemide (DEMADEX) 20 MG tablet Take 4 tablets (80 mg total) by mouth daily. 09/14/21 10/14/21  Arrien, Jimmy Picket, MD  venlafaxine XR (EFFEXOR-XR) 150 MG 24 hr capsule Take 1 capsule (150 mg total) by mouth daily. 02/27/20   Bary Leriche, PA-C     Critical care time: 41 mins     Georgann Housekeeper, AGACNP-BC [place] [facility]  See Amion for personal pager PCCM on call pager [phone] until 7pm. Please call Elink 7p-7a. [phone]  01/18/2022 8:11 AM

## 2022-01-18 NOTE — Progress Notes (Signed)
Heart Failure Navigator Progress Note  Assessed for Heart & Vascular TOC clinic readiness.  Patient does not meet criteria due to ESRD and starting hemodialysis. Also a [facility] patient. .   Navigator will sign off at this time.   Earnestine Leys, BSN, Clinical cytogeneticist Only

## 2022-01-18 NOTE — Progress Notes (Signed)
New Dialysis Start    Patient identified as new dialysis start. Kidney Education packet assembled and given. Discussed the following items with patient:     Current medications and possible changes once started:  Discussed that patient's medications may change over time.  Ex; hypertension medications and diabetes medication.  Nephrologists will adjust as needed.   Fluid restrictions reviewed:  32 oz daily goal:  All liquids count; soups, ice, jello    Phosphorus and potassium: Handout given showing high potassium and phosphorus foods.  Alternative food and drink options given.   Family support:  Daughter from [place], [place] at bedside.   Outpatient Clinic Resources:  Discussed roles of Outpatient clinic staff and advised to make a list of needs, if any, to talk with outpatient staff if needed. Patient requesting [address]. [place], [place].   Care plan schedule: Informed patient of Care Plans in outpatient setting and to participate in the care plan.  An invitation would be given from outpatient clinic.    Dialysis Access Options:  Reviewed access options with patients. Discussed in detail about care at home with AVF. Reviewed checking bruit and thrill. Discussed not to lay on or carry heavy bags or purses with Left arm.  Made sure arm restriction band was applied.   Home therapy options:  Educated patient about home therapy options:  PD vs home hemo.  Patient and daughter interested in potential PD but unclear if a candidate due to previous abdominal surgeries stated by daughter.   Patient verbalized understanding. Will continue to round on patient during admission.    Aurther Loft Dialysis Nurse Coordinator [phone]

## 2022-01-18 NOTE — Progress Notes (Signed)
Received patient in bed during thel last hour of her treatment.Awake ,alert and oriented x 4.Denies pain.She is with Bi-pap machine.Vitals stable.  Access used : Left upper arm fistula that works well for the rest of her treatment.  Duration of treatment.: 2.5 hours.  Medicine given : None  Bolus given : 100 cc NS.  Fluid removed 2,000CC.  Hemodialysis treatment issue : None.

## 2022-01-18 NOTE — Progress Notes (Signed)
[facility] NEPHROLOGY PROGRESS NOTE  Assessment/ Plan: Pt is a 82 y.o. yo female  PMH of HTN, DM, aortic stenosis status post TAVR, CAD, stroke, CKD 4/5 followed by Dr. Hollie Salk, presented with shortness of breath seen as a consultation for the management of CKD.  The patient has longstanding CKD with baseline serum creatinine level around 3-4.  The patient underwent left upper extremity brachiocephalic AV fistula creation by Dr. Carlis Abbott in 2020.  The fistula seems to be matured well.   # CKD stage IV with fluid overload/pleural effusion/acute respiratory failure; now progressed to new ESRD.  Not responding with IV diuretics therefore IHD started on 1/1 night urgently for the management of volume.  The patient completed dialysis this morning with 2 L ultrafiltration.  She is off of BiPAP and currently on oxygen via nasal cannula.  I will contact social worker to arrange outpatient HD. Plan for next dialysis tomorrow.   # Acute respiratory failure with hypoxemia due to RSV pneumonia/fluid overload: On BiPAP, Lasix and plan for dialysis as discussed above.  On empiric antibiotic with ceftriaxone, azithromycin and prednisone.  Pulmonary team is following.   # Acute decompensated diastolic CHF: Volume management with HD.  Continue fluid restriction.  Pending echo.   # Anion gap metabolic acidosis: Now managing with HD.   # Anemia of CKD: With RSV infection.  Iron saturation low but hold IV iron currently because of infection and on empiric antibiotics.  Hemoglobin 9.7.   # Hypertension: Monitor blood pressure, volume management with dialysis.  Continue antihypertensives.  # Hypokalemia: Dialyzed after the lab.  Monitor BMP.  Subjective: Seen and examined at bedside.  She is off of BiPAP and breathing is much better after dialysis.  Denies nausea, vomiting.  Still on oxygen via nasal cannula.  Urine output is around 800 cc.  Objective Vital signs in last 24 hours: Vitals:   01/18/22  0845 01/18/22 0900 01/18/22 0930 01/18/22 1015  BP: (!) 167/78 (!) 155/67 (!) 158/59 (!) 170/74  Pulse: 80 87 74 85  Resp: 16 (!) 24 (!) 21 (!) 22  Temp:      TempSrc:      SpO2: 96% 98% 97% 96%  Weight:       Weight change:   Intake/Output Summary (Last 24 hours) at 01/18/2022 1029 Last data filed at 01/18/2022 0717 Gross per 24 hour  Intake 0.15 ml  Output 2800 ml  Net -2799.85 ml       Labs: RENAL PANEL Recent Labs    05/16/21 1801 09/07/21 1225 09/07/21 1751 09/07/21 2343 09/08/21 0243 09/08/21 4270 09/09/21 0502 09/10/21 0548 09/11/21 6237 09/12/21 0343 09/13/21 0736 09/14/21 0401 11/22/21 1918 12/20/21 0535 01/17/22 0117 01/18/22 0333  NA 138   < >  --  140 141 141 141 137 138 138 139 139 138  136 135 137  K 3.9   < >  --  4.0 4.6 3.6 4.7 3.8 3.4* 4.1 4.3 4.5 3.9 3.4* 4.4 3.4*  CL 104   < >  --  108 107 109 106 103 106 103 104 107 104  103 100 102  CO2 23   < >  --  21* 23 23 26 23 22 23 24 22 22  19* 18* 19*  GLUCOSE 149*   < >  --  180* 166* 111* 100* 156* 92 99 111* 86 273* 139* 206* 153*  BUN 46*   < >  --  63* 64* 61* 58* 64* 67* 72* 70* 73*  69* 76* 60* 66*  CREATININE 3.28*   < >  --  3.40* 3.46* 3.35* 3.42* 3.70* 3.95* 4.25* 4.35* 4.17* 3.58* 3.93* 3.75* 3.92*  CALCIUM 9.3   < >  --  8.7* 9.2 9.1 9.2 8.7* 8.6* 8.7* 9.4 9.1 9.2 8.7* 9.0 8.8*  MG  --   --  2.0  --  1.9 2.0  --   --   --   --   --   --   --   --   --  2.0  PHOS  --   --   --  3.8 4.3 3.9  --   --   --  3.4  --   --   --   --   --   --   ALBUMIN 3.7  --   --  2.9* 3.1* 3.0*  --   --   --  2.8*  --   --   --   --  3.7 2.5*   < > = values in this interval not displayed.     Liver Function Tests: Recent Labs  Lab 01/17/22 0117 01/18/22 0333  AST 22 22  ALT 9 12  ALKPHOS 84 71  BILITOT 0.9 0.5  PROT 7.0 6.2*  ALBUMIN 3.7 2.5*   No results for input(s): "LIPASE", "AMYLASE" in the last 168 hours. No results for input(s): "AMMONIA" in the last 168 hours. CBC: Recent Labs     09/07/21 2343 09/08/21 0243 09/08/21 0737 11/22/21 1918 12/20/21 0535 01/17/22 0117 01/18/22 0333  HGB  --  10.9* 10.6* 9.6* 9.2* 9.8* 9.7*  MCV  --  86.8 87.0 87.1 83.4 84.4 84.5  VITAMINB12 481  --   --   --   --   --   --   FOLATE >40.0  --   --   --   --   --   --   FERRITIN 59 66  --   --   --   --  138  TIBC 249*  --   --   --   --   --  218*  IRON 39  --   --   --   --   --  11*  RETICCTPCT 1.8  --   --   --   --   --   --     Cardiac Enzymes: No results for input(s): "CKTOTAL", "CKMB", "CKMBINDEX", "TROPONINI" in the last 168 hours. CBG: Recent Labs  Lab 01/17/22 1656 01/17/22 1944 01/17/22 2339 01/18/22 0333 01/18/22 0726  GLUCAP 193* 177* 160* 137* 128*    Iron Studies:  Recent Labs    01/18/22 0333  IRON 11*  TIBC 218*  FERRITIN 138   Studies/Results: ECHOCARDIOGRAM COMPLETE  Result Date: 01/18/2022    ECHOCARDIOGRAM REPORT   Patient Name:   [name] Date of Exam: 01/18/2022 Medical Rec #:  [id number]     Height:       62.5 in Accession #:    [id number]    Weight:       163.4 lb Date of Birth:  1940-10-26     BSA:          1.765 m Patient Age:    29 years      BP:           155/67 mmHg Patient Gender: F             HR:  79 bpm. Exam Location:  Inpatient Procedure: 2D Echo, 3D Echo, Color Doppler, Cardiac Doppler and Strain Analysis Indications:    CHF-Acute Diastolic J50.09  History:        Patient has prior history of Echocardiogram examinations, most                 recent 05/12/2020. CHF, CAD, Aortic Valve Disease,                 Signs/Symptoms:Edema and Dyspnea; Risk Factors:Diabetes,                 Non-Smoker, Hypertension and Dyslipidemia.                 Aortic Valve: 23 mm Edwards Sapien prosthetic, stented (TAVR)                 valve is present in the aortic position. Procedure Date:                 07/29/20.  Sonographer:    Greer Pickerel Referring Phys: [phone] JESSICA Alison Stalling  Sonographer Comments: Global longitudinal strain was attempted.  IMPRESSIONS  1. Left ventricular ejection fraction, by estimation, is 50 to 55%. Left ventricular ejection fraction by 3D volume is 53 %. The left ventricle has low normal function. The left ventricle demonstrates regional wall motion abnormalities (see scoring diagram/findings for description). There is mild concentric left ventricular hypertrophy. Left ventricular diastolic parameters are consistent with Grade II diastolic dysfunction (pseudonormalization). Elevated left atrial pressure. There is mild hypokinesis of the left ventricular, basal-mid inferoseptal wall and inferior wall. The average left ventricular global longitudinal strain is -13.8 %. The global longitudinal strain is abnormal.  2. Right ventricular systolic function is mildly reduced. The right ventricular size is normal. There is mildly elevated pulmonary artery systolic pressure. The estimated right ventricular systolic pressure is 29.9 mmHg.  3. Left atrial size was moderately dilated.  4. Right atrial size was mildly dilated.  5. The mitral valve is grossly normal. Mild mitral valve regurgitation. No evidence of mitral stenosis. The mean mitral valve gradient is 3.7 mmHg. Moderate mitral annular calcification.  6. The aortic valve has been repaired/replaced. Aortic valve regurgitation is not visualized. There is a 23 mm Edwards Sapien prosthetic (TAVR) valve present in the aortic position. Procedure Date: 07/29/20. Echo findings are consistent with normal structure and function of the aortic valve prosthesis. Aortic valve mean gradient measures 12.0 mmHg. Aortic valve Vmax measures 2.28 m/s. Aortic valve acceleration time measures 85 msec.  7. The inferior vena cava is dilated in size with <50% respiratory variability, suggesting right atrial pressure of 15 mmHg. Comparison(s): Prior images reviewed side by side. The left ventricular function is worsened. The left ventricular wall motion abnormality is new. FINDINGS  Left Ventricle: Left  ventricular ejection fraction, by estimation, is 50 to 55%. Left ventricular ejection fraction by 3D volume is 53 %. The left ventricle has low normal function. The left ventricle demonstrates regional wall motion abnormalities. Mild hypokinesis of the left ventricular, basal-mid inferoseptal wall and inferior wall. The average left ventricular global longitudinal strain is -13.8 %. The global longitudinal strain is abnormal. The left ventricular internal cavity size was normal in size. There is mild concentric left ventricular hypertrophy. Left ventricular diastolic parameters are consistent with Grade II diastolic dysfunction (pseudonormalization). Elevated left atrial pressure. Right Ventricle: The right ventricular size is normal. No increase in right ventricular wall thickness. Right ventricular systolic function is mildly reduced. There  is mildly elevated pulmonary artery systolic pressure. The tricuspid regurgitant velocity  is 2.20 m/s, and with an assumed right atrial pressure of 15 mmHg, the estimated right ventricular systolic pressure is 05.3 mmHg. Left Atrium: Left atrial size was moderately dilated. Right Atrium: Right atrial size was mildly dilated. Pericardium: There is no evidence of pericardial effusion. Mitral Valve: The mitral valve is grossly normal. There is moderate thickening of the mitral valve leaflet(s). Moderate mitral annular calcification. Mild mitral valve regurgitation. No evidence of mitral valve stenosis. The mean mitral valve gradient is  3.7 mmHg with average heart rate of 77 bpm. Tricuspid Valve: The tricuspid valve is normal in structure. Tricuspid valve regurgitation is mild. Aortic Valve: The aortic valve has been repaired/replaced. Aortic valve regurgitation is not visualized. Aortic valve mean gradient measures 12.0 mmHg. Aortic valve peak gradient measures 20.9 mmHg. Aortic valve area, by VTI measures 1.86 cm. There is a  23 mm Edwards Sapien prosthetic, stented (TAVR)  valve present in the aortic position. Procedure Date: 07/29/20. Echo findings are consistent with normal structure and function of the aortic valve prosthesis. Pulmonic Valve: The pulmonic valve was normal in structure. Pulmonic valve regurgitation is not visualized. Aorta: The aortic root and ascending aorta are structurally normal, with no evidence of dilitation. Venous: The inferior vena cava is dilated in size with less than 50% respiratory variability, suggesting right atrial pressure of 15 mmHg. IAS/Shunts: No atrial level shunt detected by color flow Doppler.  LEFT VENTRICLE PLAX 2D LVIDd:         4.60 cm         Diastology LVIDs:         3.40 cm         LV e' medial:    3.05 cm/s LV PW:         1.30 cm         LV E/e' medial:  40.3 LV IVS:        1.20 cm         LV e' lateral:   6.31 cm/s LVOT diam:     2.30 cm         LV E/e' lateral: 19.5 LV SV:         99 LV SV Index:   56              2D LVOT Area:     4.15 cm        Longitudinal                                Strain                                2D Strain GLS  -13.8 %                                Avg:                                 3D Volume EF                                LV 3D EF:    Left  ventricul                                             ar                                             ejection                                             fraction                                             by 3D                                             volume is                                             53 %.                                 3D Volume EF:                                3D EF:        53 %                                LV EDV:       165 ml                                LV ESV:       78 ml                                LV SV:        87 ml RIGHT VENTRICLE RV S prime:     9.14 cm/s TAPSE (M-mode): 1.6 cm LEFT ATRIUM              Index        RIGHT ATRIUM           Index LA diam:        4.60 cm   2.61 cm/m   RA Area:     24.90 cm LA Vol (A2C):   95.3 ml  54.01 ml/m  RA Volume:   82.40 ml  46.70 ml/m LA Vol (A4C):   117.0 ml 66.30 ml/m LA Biplane Vol: 106.0 ml 60.07 ml/m  AORTIC VALVE  PULMONIC VALVE AV Area (Vmax):    1.98 cm      PR End Diast Vel: 3.91 msec AV Area (Vmean):   1.94 cm AV Area (VTI):     1.86 cm AV Vmax:           228.33 cm/s AV Vmean:          163.667 cm/s AV VTI:            0.533 m AV Peak Grad:      20.9 mmHg AV Mean Grad:      12.0 mmHg LVOT Vmax:         109.00 cm/s LVOT Vmean:        76.400 cm/s LVOT VTI:          0.239 m LVOT/AV VTI ratio: 0.45  AORTA Ao Root diam: 2.80 cm Ao Asc diam:  3.50 cm MITRAL VALVE                TRICUSPID VALVE MV Area (PHT): 4.11 cm     TR Peak grad:   19.4 mmHg MV Mean grad:  3.7 mmHg     TR Vmax:        220.00 cm/s MV Decel Time: 185 msec MR Peak grad: 94.5 mmHg     SHUNTS MR Vmax:      486.00 cm/s   Systemic VTI:  0.24 m MV E velocity: 123.00 cm/s  Systemic Diam: 2.30 cm MV A velocity: 120.00 cm/s MV E/A ratio:  1.03 Mihai Croitoru MD Electronically signed by Sanda Klein MD Signature Date/Time: 01/18/2022/10:25:32 AM    Final    DG Chest Portable 1 View  Result Date: 01/17/2022 CLINICAL DATA:  Shortness of breath EXAM: PORTABLE CHEST 1 VIEW COMPARISON:  12/20/2021 FINDINGS: Cardiac shadow is enlarged. TAVR is again identified. Loop recorder is seen. Slight increase in right-sided pleural effusion is noted. Persistent right basilar airspace opacity is noted with slight increase when compared with the prior exam. Patchy airspace opacity is noted in the left mid lung new from the prior exam. No bony abnormality is noted. IMPRESSION: Increasing airspace consolidation in the right base with associated enlarging effusion. New left perihilar infiltrate Electronically Signed   By: Inez Catalina [name]   On: 01/17/2022 01:35    Medications: Infusions:  sodium chloride 10 mL/hr at 01/18/22 0827   azithromycin (ZITHROMAX) 500 mg in  sodium chloride 0.9 % 250 mL IVPB 500 mg (01/18/22 0829)   cefTRIAXone (ROCEPHIN)  IV 2 g (01/18/22 0956)    Scheduled Medications:  amLODipine  10 mg Oral Daily   Chlorhexidine Gluconate Cloth  6 each Topical Q0600   heparin injection (subcutaneous)  5,000 Units Subcutaneous Q8H   hydrALAZINE  100 mg Oral Q8H   insulin aspart  0-9 Units Subcutaneous Q4H   ipratropium-albuterol  3 mL Nebulization Q4H   methylPREDNISolone (SOLU-MEDROL) injection  40 mg Intravenous Daily    have reviewed scheduled and prn medications.  Physical Exam: General:NAD, comfortable Heart:RRR, s1s2 nl Lungs: Bibasal rhonchi Abdomen:soft, Non-tender, non-distended Extremities:No edema Dialysis Access: Left upper extremity AV fistula has good thrill and bruit.  Emmalyn Hinson Prasad Ephrem Carrick 01/18/2022,10:29 AM  LOS: 1 day

## 2022-01-19 ENCOUNTER — Inpatient Hospital Stay (HOSPITAL_COMMUNITY): Payer: Medicare PPO

## 2022-01-19 DIAGNOSIS — I5033 Acute on chronic diastolic (congestive) heart failure: Secondary | ICD-10-CM | POA: Diagnosis not present

## 2022-01-19 LAB — CBC
HCT: 27.8 % — ABNORMAL LOW (ref 36.0–46.0)
Hemoglobin: 9 g/dL — ABNORMAL LOW (ref 12.0–15.0)
MCH: 27.3 pg (ref 26.0–34.0)
MCHC: 32.4 g/dL (ref 30.0–36.0)
MCV: 84.2 fL (ref 80.0–100.0)
Platelets: 283 10*3/uL (ref 150–400)
RBC: 3.3 MIL/uL — ABNORMAL LOW (ref 3.87–5.11)
RDW: 15.7 % — ABNORMAL HIGH (ref 11.5–15.5)
WBC: 12.6 10*3/uL — ABNORMAL HIGH (ref 4.0–10.5)
nRBC: 0.2 % (ref 0.0–0.2)

## 2022-01-19 LAB — BASIC METABOLIC PANEL
Anion gap: 15 (ref 5–15)
BUN: 58 mg/dL — ABNORMAL HIGH (ref 8–23)
CO2: 21 mmol/L — ABNORMAL LOW (ref 22–32)
Calcium: 8.4 mg/dL — ABNORMAL LOW (ref 8.9–10.3)
Chloride: 99 mmol/L (ref 98–111)
Creatinine, Ser: 3.16 mg/dL — ABNORMAL HIGH (ref 0.44–1.00)
GFR, Estimated: 14 mL/min — ABNORMAL LOW (ref >60)
Glucose, Bld: 184 mg/dL — ABNORMAL HIGH (ref 70–99)
Potassium: 3.4 mmol/L — ABNORMAL LOW (ref 3.5–5.1)
Sodium: 135 mmol/L (ref 135–145)

## 2022-01-19 LAB — GLUCOSE, CAPILLARY
Glucose-Capillary: 177 mg/dL — ABNORMAL HIGH (ref 70–99)
Glucose-Capillary: 149 mg/dL — ABNORMAL HIGH (ref 70–99)
Glucose-Capillary: 197 mg/dL — ABNORMAL HIGH (ref 70–99)
Glucose-Capillary: 231 mg/dL — ABNORMAL HIGH (ref 70–99)
Glucose-Capillary: 241 mg/dL — ABNORMAL HIGH (ref 70–99)

## 2022-01-19 LAB — PROCALCITONIN: Procalcitonin: 9.55 ng/mL

## 2022-01-19 LAB — PHOSPHORUS: Phosphorus: 4.2 mg/dL (ref 2.5–4.6)

## 2022-01-19 LAB — MAGNESIUM: Magnesium: 1.8 mg/dL (ref 1.7–2.4)

## 2022-01-19 LAB — HEPATITIS B SURFACE ANTIBODY, QUANTITATIVE: Hep B S AB Quant (Post): <3.1 m[IU]/mL — ABNORMAL LOW (ref >9.9)

## 2022-01-19 MED ORDER — MELATONIN 5 MG PO TABS
5.0000 mg | ORAL_TABLET | Freq: Every evening | ORAL | Status: DC | PRN
  Administered 2022-01-19 (×2): 5 mg via ORAL
  Filled 2022-01-19 (×2): qty 1

## 2022-01-19 MED ORDER — INSULIN ASPART 100 UNIT/ML IJ SOLN
0.0000 [IU] | Freq: Three times a day (TID) | INTRAMUSCULAR | Status: DC
  Administered 2022-01-19 – 2022-01-20 (×2): 3 [IU] via SUBCUTANEOUS
  Administered 2022-01-20 (×2): 2 [IU] via SUBCUTANEOUS
  Administered 2022-01-20: 3 [IU] via SUBCUTANEOUS
  Administered 2022-01-21: 2 [IU] via SUBCUTANEOUS
  Administered 2022-01-22: 5 [IU] via SUBCUTANEOUS
  Administered 2022-01-22: 2 [IU] via SUBCUTANEOUS
  Administered 2022-01-22 – 2022-01-23 (×2): 3 [IU] via SUBCUTANEOUS
  Administered 2022-01-23: 5 [IU] via SUBCUTANEOUS

## 2022-01-19 MED ORDER — IPRATROPIUM-ALBUTEROL 0.5-2.5 (3) MG/3ML IN SOLN
3.0000 mL | Freq: Four times a day (QID) | RESPIRATORY_TRACT | Status: DC
  Administered 2022-01-19 – 2022-01-20 (×6): 3 mL via RESPIRATORY_TRACT
  Filled 2022-01-19 (×7): qty 3

## 2022-01-19 MED ORDER — LIDOCAINE HCL (PF) 1 % IJ SOLN: 5.0000 mL | INTRAMUSCULAR | Status: DC | PRN

## 2022-01-19 MED ORDER — PENTAFLUOROPROP-TETRAFLUOROETH EX AERO: 1.0000 | INHALATION_SPRAY | CUTANEOUS | Status: DC | PRN

## 2022-01-19 MED ORDER — LIDOCAINE-PRILOCAINE 2.5-2.5 % EX CREA: 1.0000 | TOPICAL_CREAM | CUTANEOUS | Status: DC | PRN

## 2022-01-19 NOTE — Social Work (Signed)
CSW was notified by renal navigator that family requested resources for transportation to HD. Per daughter, family can assist, but will need intermittent assistance. CSW provided [facility] [place] information, and described the process. CSW also advised of private companies that assist with transport, though it is more expensive. Daughter noted understanding. Information added to the AVS.

## 2022-01-19 NOTE — Progress Notes (Signed)
Mobility Specialist Progress Note    01/19/22 1117  Mobility  Activity Ambulated with assistance in room  Level of Assistance Minimal assist, patient does 75% or more  Assistive Device Front wheel walker  Distance Ambulated (ft) 20 ft  Activity Response Tolerated fair  Mobility Referral Yes  $Mobility charge 1 Mobility   Pre-Mobility: 81 HR, 96% SpO2 During Mobility: 107 HR Post-Mobility: 86 HR, 97% SpO2  Pt received sitting EOB and agreeable. C/o feeling lightheaded once standing then sick on the stomach. Returned to chair with call bell in reach. RN notified.   Hildred Alamin Mobility Specialist  Please Psychologist, sport and exercise or Rehab Office at [phone]

## 2022-01-19 NOTE — Progress Notes (Signed)
Received patient in bed to unit.  Alert and oriented.  Informed consent signed and in chart.   Treatment initiated: [place] Treatment completed: 1802  Patient tolerated well.  Transported back to the room  Alert, without acute distress.  Hand-off given to patient's nurse.   Access used: AVF Access issues: none  Total UF removed: 2.9L Medication(s) given: none Post HD VS: 97.9,77.20,148/53,98%(2L) Post HD weight: 68.9kg   Donah Driver [facility]

## 2022-01-19 NOTE — Discharge Instructions (Addendum)
[employer]: [place] is the [employer]'s shared-ride transportation service for eligible riders who have a disability that prevents them from riding the fixed route bus.  There are three types of certification granted to eligible Access clients: full, conditional, and temporary certification. Full certification is granted to those who are unable to use the fixed route bus system. Conditional certification is granted to those who are able to use the fixed route bus system, but their disability prohibits 100 percent travel on the bus due to possible barriers for safe travel to or from the bus stop. Temporary certification is granted when a person has a disability that prevents the use of the fixed route bus during the length of their short-term disability.  A client may be eligible for door-to-door or curb-to-curb service once the application and site assessment review are completed. For more in-depth information on using Access, please download an Engineer, maintenance (IT).  Global web [place], [place] https://[email or web address]/departments/[email or web address]... [facility] [place] and [employer],

## 2022-01-19 NOTE — Progress Notes (Signed)
Spoke to pt's daughter, Theadora Rama, via phone regarding possible schedule options for pt for out-pt HD at d/c. Also discussed option of TCU for HD and education. Daughter feels TCU may be a good option as long as transportation is an option for pt. Pt's local family may be able to assist some but daughter feels that transportation through local resources may work best. Careers information officer CM and CSW with daughter's request for assistance with transportation to HD at d/c. At this time, pt is being considered by TCU (treatments will be Mon,Tues,Thurs,Fri) program at d/c. Daughter has been advised that pt will need to go to clinic 4x's a week instead of 3x's a week by being a part of this 30 day program. Will assist as needed.   Melven Sartorius Renal Navigator [phone]

## 2022-01-19 NOTE — Progress Notes (Signed)
[facility]  PROGRESS NOTE  [name] WNI:[id number] DOB: 25-Dec-1940 DOA: 01/17/2022 PCP: Audley Hose, MD   Brief HPI:   82 year old female with medical history of HFpEF, aortic stenosis s/p TAVR 2020, diabetes mellitus type 2, hypertension, CKD stage IV, GERD, CVA, neuropathy, hyperlipidemia admitted with acute hypoxic respiratory failure in setting of RSV and heart failure.  Failed diuresis, nephrology consulted, started on hemodialysis.    Subjective   Patient seen and examined, no shortness of breath.   Assessment/Plan:     Acute hypoxemic respiratory failure -In setting of RSV, CHF exacerbation, volume overload -Possible bacterial superinfection -Procalcitonin 11 -Continue Solu-Medrol 40 g IV daily, DuoNeb every 6 hours -Continue ceftriaxone, Zithromax  Acute on chronic diastolic heart failure -Initially started on Lasix with very minimal output -Nephrology consulted for hemodialysis for volume management  Hypertension -Blood pressure is stable -Continue amlodipine, hydralazine  CKD stage IV -Presented with volume overload worrisome for progression to ESRD -No improvement with IV Lasix -Nephrology consulted for hemodialysis -Underwent left upper extremity brachiocephalic AV fistula creation with Dr. Carlis Abbott in 2020, fistula seems to have matured well. -Patient getting dialysis as per nephrology -social work is working to arrange outpatient HD  Chronic normocytic anemia -H&H has been stable -Transfuse for hemoglobin less than 7 -Iron saturation is low but IV iron is on hold due to infection and IV antibiotics  Diabetes mellitus type 2 -CBG well-controlled -Continue sliding scale insulin NovoLog -Continue Solu-Medrol 40 mg IV daily    Medications     amLODipine  10 mg Oral Daily   Chlorhexidine Gluconate Cloth  6 each Topical Q0600   heparin injection (subcutaneous)  5,000 Units Subcutaneous Q8H   hydrALAZINE  100 mg Oral Q8H   insulin  aspart  0-9 Units Subcutaneous Q4H   ipratropium-albuterol  3 mL Nebulization Q6H   methylPREDNISolone (SOLU-MEDROL) injection  40 mg Intravenous Daily   venlafaxine XR  150 mg Oral Daily     Data Reviewed:   CBG:  Recent Labs  Lab 01/18/22 1137 01/18/22 1721 01/18/22 2301 01/19/22 0342 01/19/22 0724  GLUCAP 145* 266* 178* 177* 149*    SpO2: 96 % O2 Flow Rate (L/min): 3 L/min FiO2 (%): 32 %    Vitals:   01/19/22 0338 01/19/22 0340 01/19/22 0400 01/19/22 0728  BP: 114/74   (!) 152/71  Pulse: 84   87  Resp: 13   20  Temp: 98 F (36.7 C)   98 F (36.7 C)  TempSrc: Oral   Oral  SpO2: 96% 94%  96%  Weight:   71.1 kg       Data Reviewed:  Basic Metabolic Panel: Recent Labs  Lab 01/17/22 0117 01/18/22 0333 01/19/22 0022  NA 135 137 135  K 4.4 3.4* 3.4*  CL 100 102 99  CO2 18* 19* 21*  GLUCOSE 206* 153* 184*  BUN 60* 66* 58*  CREATININE 3.75* 3.92* 3.16*  CALCIUM 9.0 8.8* 8.4*  MG  --  2.0 1.8  PHOS  --   --  4.2    CBC: Recent Labs  Lab 01/17/22 0117 01/18/22 0333 01/19/22 0022  WBC 15.7* 11.4* 12.6*  NEUTROABS 12.5*  --   --   HGB 9.8* 9.7* 9.0*  HCT 30.2* 30.0* 27.8*  MCV 84.4 84.5 84.2  PLT 291 297 283    LFT Recent Labs  Lab 01/17/22 0117 01/18/22 0333  AST 22 22  ALT 9 12  ALKPHOS 84 71  BILITOT 0.9 0.5  PROT  7.0 6.2*  ALBUMIN 3.7 2.5*     Antibiotics: Anti-infectives (From admission, onward)    Start     Dose/Rate Route Frequency Ordered Stop   01/18/22 0800  azithromycin (ZITHROMAX) 500 mg in sodium chloride 0.9 % 250 mL IVPB        500 mg 250 mL/hr over 60 Minutes Intravenous Every 24 hours 01/17/22 1805     01/18/22 0754  cefTRIAXone (ROCEPHIN) 2 g in sodium chloride 0.9 % 100 mL IVPB        2 g 200 mL/hr over 30 Minutes Intravenous Every 24 hours 01/18/22 0754     01/18/22 0700  cefTRIAXone (ROCEPHIN) 1 g in sodium chloride 0.9 % 100 mL IVPB  Status:  Discontinued        1 g 200 mL/hr over 30 Minutes Intravenous  Every 24 hours 01/17/22 1805 01/18/22 0754   01/17/22 0645  cefTRIAXone (ROCEPHIN) 1 g in sodium chloride 0.9 % 100 mL IVPB        1 g 200 mL/hr over 30 Minutes Intravenous  Once 01/17/22 0644 01/17/22 0833   01/17/22 0645  azithromycin (ZITHROMAX) 500 mg in sodium chloride 0.9 % 250 mL IVPB        500 mg 250 mL/hr over 60 Minutes Intravenous  Once 01/17/22 0644 01/17/22 0940        DVT prophylaxis: Heparin  Code Status: Full code  Family Communication: No family at bedside   CONSULTS nephrology, PCCM   Objective    Physical Examination:   General: Appears in no acute distress Cardiovascular: S1-S2, regular Respiratory: Scattered rhonchi bilaterally Abdomen: Soft, nontender, no organomegaly Extremities: No edema in the lower extremities Neurologic: Alert, oriented x 3, no focal deficit noted   Status is: Inpatient:             [name]   [facility] If 7PM-7AM, please contact night-coverage at [email or web address], Office  [phone]   01/19/2022, 8:41 AM  LOS: 2 days

## 2022-01-19 NOTE — Progress Notes (Signed)
[facility] NEPHROLOGY PROGRESS NOTE  Assessment/ Plan: Pt is a 82 y.o. yo female  PMH of HTN, DM, aortic stenosis status post TAVR, CAD, stroke, CKD 4/5 followed by Dr. Hollie Salk, presented with shortness of breath seen as a consultation for the management of CKD.  The patient has longstanding CKD with baseline serum creatinine level around 3-4.  The patient underwent left upper extremity brachiocephalic AV fistula creation by Dr. Carlis Abbott in 2020.  The fistula seems to be matured well.   # CKD stage IV with fluid overload/pleural effusion/acute respiratory failure; now progressed to new ESRD.  Not responding with IV diuretics therefore IHD started on 1/1 night urgently for the management of volume.  Respiratory status much improved.  She is off of BiPAP and now on oxygen via nasal cannula.  Plan for second dialysis today.  AV fistula no issue.  Social worker is already following to arrange outpatient HD.   # Acute respiratory failure with hypoxemia due to RSV pneumonia/fluid overload: On BiPAP, Lasix and plan for dialysis as discussed above.  On empiric antibiotic with ceftriaxone, azithromycin and prednisone.  Seen by pulmonary team.   # Acute decompensated diastolic CHF: Volume management with HD.  Continue fluid restriction.  Echo with EF of 50 to 55%, mild LVH and grade 2 diastolic dysfunction.   # Anion gap metabolic acidosis: Now managing with HD.   # Anemia of CKD: With RSV infection.  Iron saturation low but hold IV iron currently because of infection and on empiric antibiotics.  Hemoglobin 9.   # Hypertension: Monitor blood pressure, volume management with dialysis.  Continue antihypertensives.  # Hypokalemia: Dialyzed after the lab.  Monitor BMP.  Subjective: Seen and examined at bedside.  She is now transferred to the floor.  She reported that her breathing is much better but not back to normal yet.  Currently on oxygen via nasal cannula 3 L.  Denies chest pain, nausea,  vomiting.  Plan for second HD today.  Objective Vital signs in last 24 hours: Vitals:   01/19/22 0340 01/19/22 0400 01/19/22 0728 01/19/22 0852  BP:   (!) 152/71   Pulse:   87   Resp:   20   Temp:   98 F (36.7 C)   TempSrc:   Oral   SpO2: 94%  96% 93%  Weight:  71.1 kg     Weight change:   Intake/Output Summary (Last 24 hours) at 01/19/2022 0909 Last data filed at 01/18/2022 1900 Gross per 24 hour  Intake 518.93 ml  Output --  Net 518.93 ml        Labs: RENAL PANEL Recent Labs    05/16/21 1801 09/07/21 1225 09/07/21 1751 09/07/21 2343 09/08/21 0243 09/08/21 0737 09/09/21 0502 09/10/21 0548 09/11/21 6295 09/12/21 0343 09/13/21 0736 09/14/21 0401 11/22/21 1918 12/20/21 0535 01/17/22 0117 01/18/22 0333 01/19/22 0022  NA 138   < >  --  140 141 141   < > 137 138 138 139 139 138 136 135 137 135   K 3.9   < >  --  4.0 4.6 3.6   < > 3.8 3.4* 4.1 4.3 4.5 3.9 3.4* 4.4 3.4* 3.4*  CL 104   < >  --  108 107 109   < > 103 106 103 104 107 104 103 100 102 99   CO2 23   < >  --  21* 23 23   < > 23 22 23 24 22 22  19* 18* 19* 21*  GLUCOSE 149*   < >  --  180* 166* 111*   < > 156* 92 99 111* 86 273* 139* 206* 153* 184*  BUN 46*   < >  --  63* 64* 61*   < > 64* 67* 72* 70* 73* 69* 76* 60* 66* 58*  CREATININE 3.28*   < >  --  3.40* 3.46* 3.35*   < > 3.70* 3.95* 4.25* 4.35* 4.17* 3.58* 3.93* 3.75* 3.92* 3.16*  CALCIUM 9.3   < >  --  8.7* 9.2 9.1   < > 8.7* 8.6* 8.7* 9.4 9.1 9.2 8.7* 9.0 8.8* 8.4*  MG  --   --  2.0  --  1.9 2.0  --   --   --   --   --   --   --   --   --  2.0 1.8  PHOS  --   --   --  3.8 4.3 3.9  --   --   --  3.4  --   --   --   --   --   --  4.2  ALBUMIN 3.7  --   --  2.9* 3.1* 3.0*  --   --   --  2.8*  --   --   --   --  3.7 2.5*  --    < > = values in this interval not displayed.      Liver Function Tests: Recent Labs  Lab 01/17/22 0117 01/18/22 0333  AST 22 22  ALT 9 12  ALKPHOS 84 71  BILITOT 0.9 0.5  PROT 7.0 6.2*  ALBUMIN 3.7 2.5*    No  results for input(s): "LIPASE", "AMYLASE" in the last 168 hours. No results for input(s): "AMMONIA" in the last 168 hours. CBC: Recent Labs    09/07/21 2343 09/08/21 0243 09/08/21 0737 11/22/21 1918 12/20/21 0535 01/17/22 0117 01/18/22 0333 01/19/22 0022  HGB  --  10.9*   < > 9.6* 9.2* 9.8* 9.7* 9.0*  MCV  --  86.8   < > 87.1 83.4 84.4 84.5 84.2  VITAMINB12 481  --   --   --   --   --   --   --   FOLATE >40.0  --   --   --   --   --   --   --   FERRITIN 59 66  --   --   --   --  138  --   TIBC 249*  --   --   --   --   --  218*  --   IRON 39  --   --   --   --   --  11*  --   RETICCTPCT 1.8  --   --   --   --   --   --   --    < > = values in this interval not displayed.     Cardiac Enzymes: No results for input(s): "CKTOTAL", "CKMB", "CKMBINDEX", "TROPONINI" in the last 168 hours. CBG: Recent Labs  Lab 01/18/22 1137 01/18/22 1721 01/18/22 2301 01/19/22 0342 01/19/22 0724  GLUCAP 145* 266* 178* 177* 149*     Iron Studies:  Recent Labs    01/18/22 0333  IRON 11*  TIBC 218*  FERRITIN 138    Studies/Results: DG CHEST PORT 1 VIEW  Result Date: 01/19/2022 CLINICAL DATA:  Acute respiratory failure EXAM: PORTABLE CHEST 1 VIEW COMPARISON:  01/17/2022 are FINDINGS:  Elevated right hemidiaphragm. Increased left perihilar and infrahilar airspace opacity compared to previous. Difficult to exclude right perihilar airspace opacity given the severity of the right hemidiaphragmatic elevation. Prior TAVR. Loop recorder noted. Atherosclerotic calcification of the aortic arch. Stable mild interstitial accentuation in the lungs, cannot exclude interstitial edema. There is some mild obscuration of left hemidiaphragm compatible with mild left lower lobe airspace opacity, increased from previous. IMPRESSION: 1. Increased left perihilar and infrahilar airspace opacity compared to previous, potentially from pneumonia or aspiration pneumonitis. 2. Elevated right hemidiaphragm. 3. Mild  obscuration of the left hemidiaphragm compatible with mild left lower lobe airspace opacity, increased from previous. 4. Stable mild interstitial accentuation in the lungs, cannot exclude interstitial edema. 5. Loop recorder noted. Electronically Signed   By: Van Clines [name]   On: 01/19/2022 08:14   ECHOCARDIOGRAM COMPLETE  Result Date: 01/18/2022    ECHOCARDIOGRAM REPORT   Patient Name:   [name] Polzin Date of Exam: 01/18/2022 Medical Rec #:  [id number]     Height:       62.5 in Accession #:    [id number]    Weight:       163.4 lb Date of Birth:  1940/02/17     BSA:          1.765 m Patient Age:    54 years      BP:           155/67 mmHg Patient Gender: F             HR:           79 bpm. Exam Location:  Inpatient Procedure: 2D Echo, 3D Echo, Color Doppler, Cardiac Doppler and Strain Analysis Indications:    CHF-Acute Diastolic C94.70  History:        Patient has prior history of Echocardiogram examinations, most                 recent 05/12/2020. CHF, CAD, Aortic Valve Disease,                 Signs/Symptoms:Edema and Dyspnea; Risk Factors:Diabetes,                 Non-Smoker, Hypertension and Dyslipidemia.                 Aortic Valve: 23 mm Edwards Sapien prosthetic, stented (TAVR)                 valve is present in the aortic position. Procedure Date:                 07/29/20.  Sonographer:    Greer Pickerel Referring Phys: [phone] JESSICA Alison Stalling  Sonographer Comments: Global longitudinal strain was attempted. IMPRESSIONS  1. Left ventricular ejection fraction, by estimation, is 50 to 55%. Left ventricular ejection fraction by 3D volume is 53 %. The left ventricle has low normal function. The left ventricle demonstrates regional wall motion abnormalities (see scoring diagram/findings for description). There is mild concentric left ventricular hypertrophy. Left ventricular diastolic parameters are consistent with Grade II diastolic dysfunction (pseudonormalization). Elevated left atrial pressure. There is mild  hypokinesis of the left ventricular, basal-mid inferoseptal wall and inferior wall. The average left ventricular global longitudinal strain is -13.8 %. The global longitudinal strain is abnormal.  2. Right ventricular systolic function is mildly reduced. The right ventricular size is normal. There is mildly elevated pulmonary artery systolic pressure. The estimated right ventricular systolic pressure is 36.6 mmHg.  3. Left  atrial size was moderately dilated.  4. Right atrial size was mildly dilated.  5. The mitral valve is grossly normal. Mild mitral valve regurgitation. No evidence of mitral stenosis. The mean mitral valve gradient is 3.7 mmHg. Moderate mitral annular calcification.  6. The aortic valve has been repaired/replaced. Aortic valve regurgitation is not visualized. There is a 23 mm Edwards Sapien prosthetic (TAVR) valve present in the aortic position. Procedure Date: 07/29/20. Echo findings are consistent with normal structure and function of the aortic valve prosthesis. Aortic valve mean gradient measures 12.0 mmHg. Aortic valve Vmax measures 2.28 m/s. Aortic valve acceleration time measures 85 msec.  7. The inferior vena cava is dilated in size with <50% respiratory variability, suggesting right atrial pressure of 15 mmHg. Comparison(s): Prior images reviewed side by side. The left ventricular function is worsened. The left ventricular wall motion abnormality is new. FINDINGS  Left Ventricle: Left ventricular ejection fraction, by estimation, is 50 to 55%. Left ventricular ejection fraction by 3D volume is 53 %. The left ventricle has low normal function. The left ventricle demonstrates regional wall motion abnormalities. Mild hypokinesis of the left ventricular, basal-mid inferoseptal wall and inferior wall. The average left ventricular global longitudinal strain is -13.8 %. The global longitudinal strain is abnormal. The left ventricular internal cavity size was normal in size. There is mild  concentric left ventricular hypertrophy. Left ventricular diastolic parameters are consistent with Grade II diastolic dysfunction (pseudonormalization). Elevated left atrial pressure. Right Ventricle: The right ventricular size is normal. No increase in right ventricular wall thickness. Right ventricular systolic function is mildly reduced. There is mildly elevated pulmonary artery systolic pressure. The tricuspid regurgitant velocity  is 2.20 m/s, and with an assumed right atrial pressure of 15 mmHg, the estimated right ventricular systolic pressure is 44.9 mmHg. Left Atrium: Left atrial size was moderately dilated. Right Atrium: Right atrial size was mildly dilated. Pericardium: There is no evidence of pericardial effusion. Mitral Valve: The mitral valve is grossly normal. There is moderate thickening of the mitral valve leaflet(s). Moderate mitral annular calcification. Mild mitral valve regurgitation. No evidence of mitral valve stenosis. The mean mitral valve gradient is  3.7 mmHg with average heart rate of 77 bpm. Tricuspid Valve: The tricuspid valve is normal in structure. Tricuspid valve regurgitation is mild. Aortic Valve: The aortic valve has been repaired/replaced. Aortic valve regurgitation is not visualized. Aortic valve mean gradient measures 12.0 mmHg. Aortic valve peak gradient measures 20.9 mmHg. Aortic valve area, by VTI measures 1.86 cm. There is a  23 mm Edwards Sapien prosthetic, stented (TAVR) valve present in the aortic position. Procedure Date: 07/29/20. Echo findings are consistent with normal structure and function of the aortic valve prosthesis. Pulmonic Valve: The pulmonic valve was normal in structure. Pulmonic valve regurgitation is not visualized. Aorta: The aortic root and ascending aorta are structurally normal, with no evidence of dilitation. Venous: The inferior vena cava is dilated in size with less than 50% respiratory variability, suggesting right atrial pressure of 15 mmHg.  IAS/Shunts: No atrial level shunt detected by color flow Doppler.  LEFT VENTRICLE PLAX 2D LVIDd:         4.60 cm         Diastology LVIDs:         3.40 cm         LV e' medial:    3.05 cm/s LV PW:         1.30 cm         LV E/e'  medial:  40.3 LV IVS:        1.20 cm         LV e' lateral:   6.31 cm/s LVOT diam:     2.30 cm         LV E/e' lateral: 19.5 LV SV:         99 LV SV Index:   56              2D LVOT Area:     4.15 cm        Longitudinal                                Strain                                2D Strain GLS  -13.8 %                                Avg:                                 3D Volume EF                                LV 3D EF:    Left                                             ventricul                                             ar                                             ejection                                             fraction                                             by 3D                                             volume is                                             53 %.  3D Volume EF:                                3D EF:        53 %                                LV EDV:       165 ml                                LV ESV:       78 ml                                LV SV:        87 ml RIGHT VENTRICLE RV S prime:     9.14 cm/s TAPSE (M-mode): 1.6 cm LEFT ATRIUM              Index        RIGHT ATRIUM           Index LA diam:        4.60 cm  2.61 cm/m   RA Area:     24.90 cm LA Vol (A2C):   95.3 ml  54.01 ml/m  RA Volume:   82.40 ml  46.70 ml/m LA Vol (A4C):   117.0 ml 66.30 ml/m LA Biplane Vol: 106.0 ml 60.07 ml/m  AORTIC VALVE                     PULMONIC VALVE AV Area (Vmax):    1.98 cm      PR End Diast Vel: 3.91 msec AV Area (Vmean):   1.94 cm AV Area (VTI):     1.86 cm AV Vmax:           228.33 cm/s AV Vmean:          163.667 cm/s AV VTI:            0.533 m AV Peak Grad:      20.9 mmHg AV Mean Grad:      12.0 mmHg LVOT Vmax:          109.00 cm/s LVOT Vmean:        76.400 cm/s LVOT VTI:          0.239 m LVOT/AV VTI ratio: 0.45  AORTA Ao Root diam: 2.80 cm Ao Asc diam:  3.50 cm MITRAL VALVE                TRICUSPID VALVE MV Area (PHT): 4.11 cm     TR Peak grad:   19.4 mmHg MV Mean grad:  3.7 mmHg     TR Vmax:        220.00 cm/s MV Decel Time: 185 msec MR Peak grad: 94.5 mmHg     SHUNTS MR Vmax:      486.00 cm/s   Systemic VTI:  0.24 m MV E velocity: 123.00 cm/s  Systemic Diam: 2.30 cm MV A velocity: 120.00 cm/s MV E/A ratio:  1.03 Mihai Croitoru MD Electronically signed by Sanda Klein MD Signature Date/Time: 01/18/2022/10:25:32 AM    Final     Medications: Infusions:  sodium chloride Stopped (01/18/22 1453)   azithromycin (ZITHROMAX) 500 mg in sodium  chloride 0.9 % 250 mL IVPB Stopped (01/18/22 0929)   cefTRIAXone (ROCEPHIN)  IV Stopped (01/18/22 1026)    Scheduled Medications:  amLODipine  10 mg Oral Daily   Chlorhexidine Gluconate Cloth  6 each Topical Q0600   heparin injection (subcutaneous)  5,000 Units Subcutaneous Q8H   hydrALAZINE  100 mg Oral Q8H   insulin aspart  0-9 Units Subcutaneous Q4H   ipratropium-albuterol  3 mL Nebulization Q6H   methylPREDNISolone (SOLU-MEDROL) injection  40 mg Intravenous Daily   venlafaxine XR  150 mg Oral Daily    have reviewed scheduled and prn medications.  Physical Exam: General:NAD, comfortable Heart:RRR, s1s2 nl Lungs: Bibasal rhonchi, no increased work of breathing. Abdomen:soft, Non-tender, non-distended Extremities:No edema Dialysis Access: Left upper extremity AV fistula has good thrill and bruit.  Shavawn Stobaugh Reesa Chew Loic Hobin 01/19/2022,9:09 AM  LOS: 2 days

## 2022-01-20 DIAGNOSIS — E440 Moderate protein-calorie malnutrition: Secondary | ICD-10-CM | POA: Insufficient documentation

## 2022-01-20 DIAGNOSIS — N171 Acute kidney failure with acute cortical necrosis: Secondary | ICD-10-CM

## 2022-01-20 DIAGNOSIS — N184 Chronic kidney disease, stage 4 (severe): Secondary | ICD-10-CM

## 2022-01-20 DIAGNOSIS — E1122 Type 2 diabetes mellitus with diabetic chronic kidney disease: Secondary | ICD-10-CM | POA: Diagnosis not present

## 2022-01-20 DIAGNOSIS — N185 Chronic kidney disease, stage 5: Secondary | ICD-10-CM | POA: Diagnosis not present

## 2022-01-20 DIAGNOSIS — I5033 Acute on chronic diastolic (congestive) heart failure: Secondary | ICD-10-CM | POA: Diagnosis not present

## 2022-01-20 LAB — GLUCOSE, CAPILLARY
Glucose-Capillary: 243 mg/dL — ABNORMAL HIGH (ref 70–99)
Glucose-Capillary: 203 mg/dL — ABNORMAL HIGH (ref 70–99)
Glucose-Capillary: 189 mg/dL — ABNORMAL HIGH (ref 70–99)
Glucose-Capillary: 246 mg/dL — ABNORMAL HIGH (ref 70–99)

## 2022-01-20 MED ORDER — POLYETHYLENE GLYCOL 3350 17 G PO PACK
17.0000 g | PACK | Freq: Every day | ORAL | Status: DC
  Administered 2022-01-20 – 2022-01-24 (×4): 17 g via ORAL
  Filled 2022-01-20 (×4): qty 1

## 2022-01-20 MED ORDER — SENNOSIDES-DOCUSATE SODIUM 8.6-50 MG PO TABS
1.0000 | ORAL_TABLET | Freq: Two times a day (BID) | ORAL | Status: DC
  Administered 2022-01-20 – 2022-01-24 (×5): 1 via ORAL
  Filled 2022-01-20 (×5): qty 1

## 2022-01-20 MED ORDER — RENA-VITE PO TABS
1.0000 | ORAL_TABLET | Freq: Every day | ORAL | Status: DC
  Administered 2022-01-20 – 2022-01-23 (×3): 1 via ORAL
  Filled 2022-01-20 (×3): qty 1

## 2022-01-20 MED ORDER — IPRATROPIUM-ALBUTEROL 0.5-2.5 (3) MG/3ML IN SOLN
3.0000 mL | Freq: Three times a day (TID) | RESPIRATORY_TRACT | Status: DC
  Administered 2022-01-21: 3 mL via RESPIRATORY_TRACT
  Filled 2022-01-20 (×2): qty 3

## 2022-01-20 MED ORDER — SODIUM CHLORIDE 0.9 % IV SOLN
125.0000 mg | INTRAVENOUS | Status: DC
  Administered 2022-01-21: 125 mg via INTRAVENOUS
  Filled 2022-01-20 (×2): qty 10

## 2022-01-20 MED ORDER — HYDRALAZINE HCL 20 MG/ML IJ SOLN
5.0000 mg | INTRAMUSCULAR | Status: DC | PRN
  Administered 2022-01-20 – 2022-01-21 (×2): 5 mg via INTRAVENOUS
  Filled 2022-01-20: qty 1

## 2022-01-20 MED ORDER — DARBEPOETIN ALFA 40 MCG/0.4ML IJ SOSY
40.0000 ug | PREFILLED_SYRINGE | INTRAMUSCULAR | Status: DC
  Filled 2022-01-20: qty 0.4

## 2022-01-20 MED ORDER — BISACODYL 10 MG RE SUPP: 10.0000 mg | Freq: Every day | RECTAL | Status: DC | PRN

## 2022-01-20 MED ORDER — HYDROXYZINE HCL 10 MG PO TABS
10.0000 mg | ORAL_TABLET | Freq: Three times a day (TID) | ORAL | Status: DC | PRN
  Administered 2022-01-20 – 2022-01-22 (×3): 10 mg via ORAL
  Filled 2022-01-20 (×3): qty 1

## 2022-01-20 MED ORDER — ORAL CARE MOUTH RINSE: 15.0000 mL | OROMUCOSAL | Status: DC | PRN

## 2022-01-20 MED ORDER — POLYETHYLENE GLYCOL 3350 17 G PO PACK: 17.0000 g | PACK | Freq: Every day | ORAL | Status: DC | PRN

## 2022-01-20 MED ORDER — ONDANSETRON HCL 4 MG/2ML IJ SOLN
4.0000 mg | Freq: Four times a day (QID) | INTRAMUSCULAR | Status: DC | PRN
  Administered 2022-01-20: 4 mg via INTRAVENOUS
  Filled 2022-01-20 (×2): qty 2

## 2022-01-20 MED ORDER — ENSURE ENLIVE PO LIQD
237.0000 mL | Freq: Two times a day (BID) | ORAL | Status: DC
  Administered 2022-01-20: 237 mL via ORAL

## 2022-01-20 NOTE — Progress Notes (Signed)
[facility]  PROGRESS NOTE  JALILAH WILTSIE RDE:[id number] DOB: [birth year] DOA: 01/17/2022 PCP: Audley Hose, MD   Brief HPI:   82 year old female with medical history of HFpEF, aortic stenosis s/p TAVR 2020, diabetes mellitus type 2, hypertension, CKD stage IV, GERD, CVA, neuropathy, hyperlipidemia admitted with acute hypoxic respiratory failure in setting of RSV and heart failure.  Failed diuresis, nephrology consulted, started on hemodialysis.    Subjective   Patient seen and examined, denies shortness of breath.   Assessment/Plan:     Acute hypoxemic respiratory failure -In setting of RSV, CHF exacerbation, volume overload -Possible bacterial superinfection -Procalcitonin 11 -Continue Solu-Medrol 40 g IV daily, DuoNeb every 6 hours -Continue ceftriaxone, Zithromax  Acute on chronic diastolic heart failure -Initially started on Lasix with very minimal output -Nephrology consulted for hemodialysis for volume management  Hypertension -Blood pressure is stable -Continue amlodipine, hydralazine  CKD stage IV -Presented with volume overload worrisome for progression to ESRD -No improvement with IV Lasix -Nephrology consulted for hemodialysis -Underwent left upper extremity brachiocephalic AV fistula creation with Dr. Carlis Abbott in 2020, fistula seems to have matured well. -Patient getting dialysis as per nephrology -social work is working to arrange outpatient HD  Chronic normocytic anemia -H&H has been stable -Transfuse for hemoglobin less than 7 -Iron saturation is low but IV iron is on hold due to infection and IV antibiotics  Diabetes mellitus type 2 -CBG well-controlled -Continue sliding scale insulin NovoLog -Continue Solu-Medrol 40 mg IV daily    Medications     amLODipine  10 mg Oral Daily   Chlorhexidine Gluconate Cloth  6 each Topical Q0600   [START ON 01/21/2022] darbepoetin (ARANESP) injection - DIALYSIS  40 mcg Subcutaneous Q Fri-1800   heparin  injection (subcutaneous)  5,000 Units Subcutaneous Q8H   hydrALAZINE  100 mg Oral Q8H   insulin aspart  0-9 Units Subcutaneous TID AC & HS   ipratropium-albuterol  3 mL Nebulization Q6H   methylPREDNISolone (SOLU-MEDROL) injection  40 mg Intravenous Daily   venlafaxine XR  150 mg Oral Daily     Data Reviewed:   CBG:  Recent Labs  Lab 01/19/22 0724 01/19/22 1047 01/19/22 2103 01/19/22 2125 01/20/22 0613  GLUCAP 149* 197* 241* 231* 243*    SpO2: 94 % O2 Flow Rate (L/min): 2 L/min FiO2 (%): 32 %    Vitals:   01/20/22 0407 01/20/22 0654 01/20/22 0707 01/20/22 0825  BP:   (!) 169/65   Pulse:   91   Resp:   19   Temp:   98 F (36.7 C)   TempSrc: Oral  Oral   SpO2:   95% 94%  Weight:  68.4 kg        Data Reviewed:  Basic Metabolic Panel: Recent Labs  Lab 01/17/22 0117 01/18/22 0333 01/19/22 0022  NA 135 137 135  K 4.4 3.4* 3.4*  CL 100 102 99  CO2 18* 19* 21*  GLUCOSE 206* 153* 184*  BUN 60* 66* 58*  CREATININE 3.75* 3.92* 3.16*  CALCIUM 9.0 8.8* 8.4*  MG  --  2.0 1.8  PHOS  --   --  4.2    CBC: Recent Labs  Lab 01/17/22 0117 01/18/22 0333 01/19/22 0022  WBC 15.7* 11.4* 12.6*  NEUTROABS 12.5*  --   --   HGB 9.8* 9.7* 9.0*  HCT 30.2* 30.0* 27.8*  MCV 84.4 84.5 84.2  PLT 291 297 283    LFT Recent Labs  Lab 01/17/22 0117 01/18/22 0333  AST  22 22  ALT 9 12  ALKPHOS 84 71  BILITOT 0.9 0.5  PROT 7.0 6.2*  ALBUMIN 3.7 2.5*     Antibiotics: Anti-infectives (From admission, onward)    Start     Dose/Rate Route Frequency Ordered Stop   01/18/22 0800  azithromycin (ZITHROMAX) 500 mg in sodium chloride 0.9 % 250 mL IVPB        500 mg 250 mL/hr over 60 Minutes Intravenous Every 24 hours 01/17/22 1805     01/18/22 0754  cefTRIAXone (ROCEPHIN) 2 g in sodium chloride 0.9 % 100 mL IVPB        2 g 200 mL/hr over 30 Minutes Intravenous Every 24 hours 01/18/22 0754     01/18/22 0700  cefTRIAXone (ROCEPHIN) 1 g in sodium chloride 0.9 % 100 mL IVPB   Status:  Discontinued        1 g 200 mL/hr over 30 Minutes Intravenous Every 24 hours 01/17/22 1805 01/18/22 0754   01/17/22 0645  cefTRIAXone (ROCEPHIN) 1 g in sodium chloride 0.9 % 100 mL IVPB        1 g 200 mL/hr over 30 Minutes Intravenous  Once 01/17/22 0644 01/17/22 0833   01/17/22 0645  azithromycin (ZITHROMAX) 500 mg in sodium chloride 0.9 % 250 mL IVPB        500 mg 250 mL/hr over 60 Minutes Intravenous  Once 01/17/22 0644 01/17/22 0940        DVT prophylaxis: Heparin  Code Status: Full code  Family Communication: No family at bedside   CONSULTS nephrology, PCCM   Objective    Physical Examination:   Appears in no acute distress S2, regular, no murmur auscultated Bibasilar crackles auscultated Abdomen is soft, nontender, no organomegaly No edema in the lower extremities   Status is: Inpatient:             [name]   [facility] If 7PM-7AM, please contact night-coverage at [email or web address], Office  [phone]   01/20/2022, 8:45 AM  LOS: 3 days

## 2022-01-20 NOTE — Plan of Care (Signed)
  Problem: Education: Goal: Knowledge of General Education information will improve Description: Including pain rating scale, medication(s)/side effects and non-pharmacologic comfort measures Outcome: Progressing   Problem: Clinical Measurements: Goal: Ability to maintain clinical measurements within normal limits will improve Outcome: Progressing Goal: Diagnostic test results will improve Outcome: Progressing Goal: Respiratory complications will improve Outcome: Progressing Goal: Cardiovascular complication will be avoided Outcome: Progressing   Problem: Activity: Goal: Risk for activity intolerance will decrease Outcome: Progressing   Problem: Nutrition: Goal: Adequate nutrition will be maintained Outcome: Progressing   Problem: Pain Managment: Goal: General experience of comfort will improve Outcome: Progressing   Problem: Safety: Goal: Ability to remain free from injury will improve Outcome: Progressing

## 2022-01-20 NOTE — Progress Notes (Signed)
Admit: 01/17/2022 LOS: 3  84F new ESRD, AHRF 2/2 RSV, Vol overload  Subjective:  HD #2 yesterday 2.9L CLIP in process, ? TCU  ABX for ? CAP, also RSV positive Using AVF Breathing improved, on 2L [place]  01/03 0701 - 01/04 0700 In: 350.2 [IV Piggyback:350.2] Out: 2900   Filed Weights   01/19/22 0400 01/19/22 1802 01/20/22 0654  Weight: 71.1 kg 68.9 kg 68.4 kg    Scheduled Meds:  amLODipine  10 mg Oral Daily   Chlorhexidine Gluconate Cloth  6 each Topical Q0600   heparin injection (subcutaneous)  5,000 Units Subcutaneous Q8H   hydrALAZINE  100 mg Oral Q8H   insulin aspart  0-9 Units Subcutaneous TID AC & HS   ipratropium-albuterol  3 mL Nebulization Q6H   methylPREDNISolone (SOLU-MEDROL) injection  40 mg Intravenous Daily   venlafaxine XR  150 mg Oral Daily   Continuous Infusions:  sodium chloride Stopped (01/18/22 1453)   azithromycin (ZITHROMAX) 500 mg in sodium chloride 0.9 % 250 mL IVPB Stopped (01/19/22 1201)   cefTRIAXone (ROCEPHIN)  IV Stopped (01/19/22 0950)   PRN Meds:.sodium chloride, acetaminophen **OR** acetaminophen, guaiFENesin-dextromethorphan, hydrALAZINE, melatonin, mouth rinse, polyethylene glycol  Current Labs: reviewed   Latest Reference Range & Units 01/18/22 03:33  Saturation Ratios 10.4 - 31.8 % 5 (L)  Ferritin 11 - 307 ng/mL 138  (L): Data is abnormally low  Physical Exam:  Blood pressure (!) 169/65, pulse 91, temperature 98 F (36.7 C), temperature source Oral, resp. rate 19, weight 68.4 kg, SpO2 95 %. Elderly female NAD Diminished in bases b/l RRR S/nt/nd AVF +B/T 1-2+ LEE Nonfocal  A New ESRD HD start 1/1 using LUE AVF CLIP In process, possibly to TCU For now Cont HD on MWF schedule, next Tx 1/5 AHRF 2/2 RSV + pulm edema, improved, now on 2L [place]; empiric ABX CTX/Azithro per [facility] AoC HFpEF, largely as above HTN: BPs stable, follow with UF Anemia: ESA tomorrow with HD, sig IDA above, 1/1 BCx x2 NGTD start IV Fe 1/5 BMD, Ca and P stable, no  meds, CTM  P HD tomorrow: 3K, 3L UF, SBP > 148mmHg, 300. 3.5h ESA and Fe tomorrow Medication Issues; Preferred narcotic agents for pain control are hydromorphone, fentanyl, and methadone. Morphine should not be used.  Baclofen should be avoided Avoid oral sodium phosphate and magnesium citrate based laxatives / bowel preps    Pearson Grippe MD 01/20/2022, 8:16 AM  Recent Labs  Lab 01/17/22 0117 01/18/22 0333 01/19/22 0022  NA 135 137 135  K 4.4 3.4* 3.4*  CL 100 102 99  CO2 18* 19* 21*  GLUCOSE 206* 153* 184*  BUN 60* 66* 58*  CREATININE 3.75* 3.92* 3.16*  CALCIUM 9.0 8.8* 8.4*  PHOS  --   --  4.2   Recent Labs  Lab 01/17/22 0117 01/18/22 0333 01/19/22 0022  WBC 15.7* 11.4* 12.6*  NEUTROABS 12.5*  --   --   HGB 9.8* 9.7* 9.0*  HCT 30.2* 30.0* 27.8*  MCV 84.4 84.5 84.2  PLT 291 297 283

## 2022-01-20 NOTE — Progress Notes (Signed)
Mobility Specialist Progress Note    01/20/22 1044  Mobility  Activity Ambulated with assistance in room  Level of Assistance Minimal assist, patient does 75% or more  Assistive Device Front wheel walker  Distance Ambulated (ft) 20 ft  Activity Response Tolerated fair  Mobility Referral Yes  $Mobility charge 1 Mobility   Pre-Mobility: 87 HR, 167/71 (97) BP, 93% SpO2 Post-Mobility: 80 HR, 145/67 (91) BP, 93% SpO2  Pt received sitting EOB and agreeable. C/o some lightheaded and dizziness. Upon standing to have housecoat put on, pt requested to sit back down for a rest break. BP 156/69 (95). Deferred out of room ambulation and ambulated to chair. Left sitting with call bell in reach.   Hildred Alamin Mobility Specialist  Please Psychologist, sport and exercise or Rehab Office at [phone]

## 2022-01-20 NOTE — Plan of Care (Signed)
  Problem: Education: Goal: Knowledge of General Education information will improve Description: Including pain rating scale, medication(s)/side effects and non-pharmacologic comfort measures Outcome: Progressing   Problem: Health Behavior/Discharge Planning: Goal: Ability to manage health-related needs will improve Outcome: Progressing   Problem: Clinical Measurements: Goal: Ability to maintain clinical measurements within normal limits will improve Outcome: Progressing Goal: Will remain free from infection Outcome: Progressing Goal: Diagnostic test results will improve Outcome: Progressing Goal: Respiratory complications will improve Outcome: Progressing Goal: Cardiovascular complication will be avoided Outcome: Progressing   Problem: Activity: Goal: Risk for activity intolerance will decrease Outcome: Progressing   Problem: Nutrition: Goal: Adequate nutrition will be maintained Outcome: Progressing   Problem: Coping: Goal: Level of anxiety will decrease Outcome: Progressing   Problem: Elimination: Goal: Will not experience complications related to bowel motility Outcome: Progressing Goal: Will not experience complications related to urinary retention Outcome: Progressing   Problem: Pain Managment: Goal: General experience of comfort will improve Outcome: Progressing   Problem: Safety: Goal: Ability to remain free from injury will improve Outcome: Progressing   Problem: Skin Integrity: Goal: Risk for impaired skin integrity will decrease Outcome: Progressing   Problem: Education: Goal: Ability to demonstrate management of disease process will improve Outcome: Progressing Goal: Ability to verbalize understanding of medication therapies will improve Outcome: Progressing Goal: Individualized Educational Video(s) Outcome: Progressing   Problem: Activity: Goal: Capacity to carry out activities will improve Outcome: Progressing   Problem: Cardiac: Goal:  Ability to achieve and maintain adequate cardiopulmonary perfusion will improve Outcome: Progressing   Problem: Education: Goal: Ability to describe self-care measures that may prevent or decrease complications (Diabetes Survival Skills Education) will improve Outcome: Progressing Goal: Individualized Educational Video(s) Outcome: Progressing   Problem: Coping: Goal: Ability to adjust to condition or change in health will improve Outcome: Progressing   Problem: Fluid Volume: Goal: Ability to maintain a balanced intake and output will improve Outcome: Progressing   Problem: Health Behavior/Discharge Planning: Goal: Ability to identify and utilize available resources and services will improve Outcome: Progressing Goal: Ability to manage health-related needs will improve Outcome: Progressing   Problem: Metabolic: Goal: Ability to maintain appropriate glucose levels will improve Outcome: Progressing   Problem: Nutritional: Goal: Maintenance of adequate nutrition will improve Outcome: Progressing Goal: Progress toward achieving an optimal weight will improve Outcome: Progressing   Problem: Skin Integrity: Goal: Risk for impaired skin integrity will decrease Outcome: Progressing   Problem: Tissue Perfusion: Goal: Adequacy of tissue perfusion will improve Outcome: Progressing   Problem: Education: Goal: Knowledge of disease and its progression will improve Outcome: Progressing Goal: Individualized Educational Video(s) Outcome: Progressing   Problem: Fluid Volume: Goal: Compliance with measures to maintain balanced fluid volume will improve Outcome: Progressing   Problem: Health Behavior/Discharge Planning: Goal: Ability to manage health-related needs will improve Outcome: Progressing   Problem: Nutritional: Goal: Ability to make healthy dietary choices will improve Outcome: Progressing   Problem: Clinical Measurements: Goal: Complications related to the disease  process, condition or treatment will be avoided or minimized Outcome: Progressing

## 2022-01-20 NOTE — Progress Notes (Signed)
Initial Nutrition Assessment  DOCUMENTATION CODES:   Non-severe (moderate) malnutrition in context of chronic illness  INTERVENTION:   - Liberalize diet to Regular with 1200 ml fluid restriction to promote PO intake and provide additional food options at mealtimes  - Ensure Enlive po BID, each supplement provides 350 kcal and 20 grams of protein  - Renal MVI daily  - Diet education handouts provided  NUTRITION DIAGNOSIS:   Moderate Malnutrition related to chronic illness (new ESRD, CHF) as evidenced by mild fat depletion, moderate muscle depletion.  GOAL:   Patient will meet greater than or equal to 90% of their needs  MONITOR:   PO intake, Supplement acceptance, Labs, Weight trends, I & O's  REASON FOR ASSESSMENT:   Consult Diet education  ASSESSMENT:   82 year old female who presented to the ED on 1/01 with SOB, cough, weakness. PMH of CHF, CAD s/p stent, T2DM, HTN, HLD, s/p TAVR, CKD IV. Pt admitted with RSV, HF exacerbation and volume overload.  RD consulted for new ESRD diet education. Pt has started HD this admission. Last HD was on 1/03 with 2900 ml net UF. Post-HD weight was 68.9 kg.  Spoke with pt at bedside. Pt reports decreased appetite and decreased PO intake since being sick. Noted breakfast meal tray in room with only a few bites of food consumed. Pt reports that she has not been eating much at all this admission. She shares that PTA, she was eating fairly well and was consuming Glucerna oral nutrition supplements. She is amenable to Ensure supplements during admission to maximize kcal and protein intake. Discussed with RN.  RD will liberalize diet to Regular with fluid restriction given phosphorus is WNL and potassium is actually low. With pt's poor PO intake, malnutrition, and current lab values, diet restriction is unnecessary at this time. RD will also order daily renal MVI.  Discussed weight history. Pt reports recent weight loss secondary to volume  status and removal of excess fluid with HD. Pt reports that she used to weigh 165-170 lbs and now weighs 150 lbs after fluid removal. Reviewed weight history in chart. Pt with a 7.7 kg total weight loss since 07/14/21. This is a 10% weight loss in just over 6 months which is not quite significant for timeframe. Suspect majority of weight loss is secondary to fluid removal.  RD left "Low Sodium Nutrition Therapy" handout from the [employer] in pt's room. Will plan to review at a later date.  Admit weight: 74.1 kg Current weight: 68.4 kg  Meal Completion: 10% x 1 documented meal  Medications reviewed and include: aranesp weekly, SSI, IV solu-medrol rena-vit, miralax, senna, IV abx, IV ferric gluconate with HD  Labs reviewed: potassium 3.4, hemoglobin 9.0 CBG's: 197-243 x 24 hours  I/O's: -4.6 L since admit  NUTRITION - FOCUSED PHYSICAL EXAM:  Flowsheet Row Most Recent Value  Orbital Region Mild depletion  Upper Arm Region Mild depletion  Thoracic and Lumbar Region Moderate depletion  Buccal Region Mild depletion  Temple Region Mild depletion  Clavicle Bone Region Moderate depletion  Clavicle and Acromion Bone Region Moderate depletion  Scapular Bone Region Mild depletion  Dorsal Hand Moderate depletion  Patellar Region Mild depletion  Anterior Thigh Region Moderate depletion  Posterior Calf Region Mild depletion  Edema (RD Assessment) Mild  [BLE]  Hair Reviewed  Eyes Reviewed  Mouth Reviewed  Skin Reviewed  Nails Reviewed       Diet Order:   Diet Order  Diet regular Room service appropriate? Yes with Assist; Fluid consistency: Thin; Fluid restriction: 1200 mL Fluid  Diet effective now                   EDUCATION NEEDS:   Not appropriate for education at this time  Skin:  Skin Assessment: Reviewed RN Assessment (skin tear to nose)  Last BM:  01/15/22  Height:   Ht Readings from Last 1 Encounters:  12/20/21 5' 2.5" (1.588  m)    Weight:   Wt Readings from Last 1 Encounters:  01/20/22 68.4 kg    BMI:  Body mass index is 27.14 kg/m.  Estimated Nutritional Needs:   Kcal:  1600-1800  Protein:  80-95 grams  Fluid:  1000 ml + UOP    Gustavus Bryant, MS, RD, LDN Inpatient Clinical Dietitian Please see AM[facility] for contact information.

## 2022-01-20 NOTE — Progress Notes (Signed)
Pt has been accepted at Transitional Care Unit at [facility]) at d/c. Pt will receive HD on Monday,Tuesday,Thursday,Friday at 12:50. For first appt, pt will need to arrive at 12:15 to complete paperwork prior to treatment. Met with pt at bedside to discuss these details. Schedule letter provided to pt as well. Pt states there is no need to call daughter to discuss details that she will provide details to daughter. Appts added to AVS as well. Update provided to attending, nephrologist, RN CM, and CSW. Will need to coordinate pt's start date with clinic once d/c date is known. Will assist as needed.   Melven Sartorius Renal Navigator [phone]

## 2022-01-21 ENCOUNTER — Inpatient Hospital Stay (HOSPITAL_COMMUNITY): Payer: Medicare PPO

## 2022-01-21 DIAGNOSIS — I5033 Acute on chronic diastolic (congestive) heart failure: Secondary | ICD-10-CM | POA: Diagnosis not present

## 2022-01-21 DIAGNOSIS — N185 Chronic kidney disease, stage 5: Secondary | ICD-10-CM | POA: Diagnosis not present

## 2022-01-21 DIAGNOSIS — N171 Acute kidney failure with acute cortical necrosis: Secondary | ICD-10-CM | POA: Diagnosis not present

## 2022-01-21 DIAGNOSIS — E1122 Type 2 diabetes mellitus with diabetic chronic kidney disease: Secondary | ICD-10-CM | POA: Diagnosis not present

## 2022-01-21 LAB — GLUCOSE, CAPILLARY
Glucose-Capillary: 181 mg/dL — ABNORMAL HIGH (ref 70–99)
Glucose-Capillary: 168 mg/dL — ABNORMAL HIGH (ref 70–99)
Glucose-Capillary: 174 mg/dL — ABNORMAL HIGH (ref 70–99)
Glucose-Capillary: 156 mg/dL — ABNORMAL HIGH (ref 70–99)

## 2022-01-21 LAB — BASIC METABOLIC PANEL
Anion gap: 18 — ABNORMAL HIGH (ref 5–15)
BUN: 56 mg/dL — ABNORMAL HIGH (ref 8–23)
CO2: 24 mmol/L (ref 22–32)
Calcium: 8.6 mg/dL — ABNORMAL LOW (ref 8.9–10.3)
Chloride: 96 mmol/L — ABNORMAL LOW (ref 98–111)
Creatinine, Ser: 3.14 mg/dL — ABNORMAL HIGH (ref 0.44–1.00)
GFR, Estimated: 14 mL/min — ABNORMAL LOW (ref >60)
Glucose, Bld: 232 mg/dL — ABNORMAL HIGH (ref 70–99)
Potassium: 3.8 mmol/L (ref 3.5–5.1)
Sodium: 138 mmol/L (ref 135–145)

## 2022-01-21 MED ORDER — LABETALOL HCL 5 MG/ML IV SOLN
10.0000 mg | Freq: Once | INTRAVENOUS | Status: AC
  Administered 2022-01-21: 10 mg via INTRAVENOUS
  Filled 2022-01-21: qty 4

## 2022-01-21 MED ORDER — SODIUM CHLORIDE 0.9 % IV SOLN
2.0000 g | Freq: Once | INTRAVENOUS | Status: AC
  Administered 2022-01-21: 2 g via INTRAVENOUS
  Filled 2022-01-21: qty 20

## 2022-01-21 MED ORDER — SODIUM CHLORIDE 0.9 % IV SOLN
500.0000 mg | Freq: Once | INTRAVENOUS | Status: AC
  Administered 2022-01-21: 500 mg via INTRAVENOUS
  Filled 2022-01-21: qty 5

## 2022-01-21 MED ORDER — IPRATROPIUM-ALBUTEROL 0.5-2.5 (3) MG/3ML IN SOLN: 3.0000 mL | Freq: Four times a day (QID) | RESPIRATORY_TRACT | Status: DC | PRN

## 2022-01-21 NOTE — Progress Notes (Signed)
Received patient in bed to unit.  Alert and oriented.  Informed consent signed and in chart.   Treatment initiated: 0800 Treatment completed: 1213  Patient tolerated well.  Transported back to the room  Alert, without acute distress.  Hand-off given to patient's nurse.   Access used: AVF Access issues: None  Total UF removed: 3L Medication(s) given: Ferric Gluconate, Atarax Post HD VS: 97.5,173/64,92,28,92% Post HD weight: 65.7kg   [facility]

## 2022-01-21 NOTE — Progress Notes (Signed)
Spoke to [employer], Therapist, sports at [employer] at [employer]. Pt can start on Monday if pt stable for d/c this weekend. Pt would need to arrive at 12:15 for 12:50 chair time. Discussed arrangements with pt yesterday and added to AVS as well. Contacted renal PA regarding clinic's need for orders should pt d/c this weekend. Will assist as needed.   Melven Sartorius Renal Navigator [phone]

## 2022-01-21 NOTE — Progress Notes (Signed)
RT responded to respiratory distress call from RN. Upon arrival into the room RN had placed pt on 100% NRB and pt looked to be in respiratory distress. Pt was placed on BIPAP 16/8/100% and neb treatment was given. Pts WOB started to look better as soon as BIPAP was placed. Rapid response and RN at bedside. RT to continue to monitor.

## 2022-01-21 NOTE — Procedures (Signed)
I was present at this dialysis session. I have reviewed the session itself and made appropriate changes.   CLIP completed to TCU.  Starts Monday 01/24/22  K is 3.8 on 3K bath.  Using AVF.  Feels well. No c/o.  Maybe home today?   Filed Weights   01/19/22 1802 01/20/22 0654 01/21/22 0618  Weight: 68.9 kg 68.4 kg 68.4 kg    Recent Labs  Lab 01/19/22 0022 01/21/22 0059  NA 135 138  K 3.4* 3.8  CL 99 96*  CO2 21* 24  GLUCOSE 184* 232*  BUN 58* 56*  CREATININE 3.16* 3.14*  CALCIUM 8.4* 8.6*  PHOS 4.2  --     Recent Labs  Lab 01/17/22 0117 01/18/22 0333 01/19/22 0022  WBC 15.7* 11.4* 12.6*  NEUTROABS 12.5*  --   --   HGB 9.8* 9.7* 9.0*  HCT 30.2* 30.0* 27.8*  MCV 84.4 84.5 84.2  PLT 291 297 283    Scheduled Meds:  amLODipine  10 mg Oral Daily   Chlorhexidine Gluconate Cloth  6 each Topical Q0600   darbepoetin (ARANESP) injection - DIALYSIS  40 mcg Subcutaneous Q Fri-1800   feeding supplement  237 mL Oral BID BM   heparin injection (subcutaneous)  5,000 Units Subcutaneous Q8H   hydrALAZINE  100 mg Oral Q8H   insulin aspart  0-9 Units Subcutaneous TID AC & HS   ipratropium-albuterol  3 mL Nebulization TID   methylPREDNISolone (SOLU-MEDROL) injection  40 mg Intravenous Daily   multivitamin  1 tablet Oral QHS   polyethylene glycol  17 g Oral Daily   senna-docusate  1 tablet Oral BID   venlafaxine XR  150 mg Oral Daily   Continuous Infusions:  sodium chloride Stopped (01/18/22 1453)   azithromycin (ZITHROMAX) 500 mg in sodium chloride 0.9 % 250 mL IVPB     cefTRIAXone (ROCEPHIN)  IV     ferric gluconate (FERRLECIT) IVPB     PRN Meds:.sodium chloride, acetaminophen **OR** acetaminophen, bisacodyl, guaiFENesin-dextromethorphan, hydrALAZINE, hydrOXYzine, ondansetron (ZOFRAN) IV, mouth rinse, polyethylene glycol   Susan Grippe  MD 01/21/2022, 9:21 AM

## 2022-01-21 NOTE — Significant Event (Addendum)
Rapid Response Event Note   Reason for Call :  Pt returned from HD in acute respiratory distress. SpO2 80% on 6LNC.   RN asked to place pt on 100% NRB. RT asked to evaluate patient.   Initial Focused Assessment:  Pt lying in bed, AO. Accessory muscle use. Tachypnea. Lung sounds rhonchus throughout. Skin is warm, moist, pink. JVD noted.   VS: T 97.73F, BP 188/95 (121), HR 121, RR 27, SpO2 99% on BiPAP 16/8/100% CBG: 168  Interventions:  -Albuterol 2.5mg  Neb -BiPAP -CXR  Plan of Care:  -BiPAP per RT -IV Labetalol for BP -Follow-up with provider results of CXR  Call rapid response for additional needs  Event Summary:  MD Notified: Dr. Darrick Meigs Call Time: 1328 - asked RT to evaluate patient Arrival Time: 1340 End Time: [place], RN

## 2022-01-21 NOTE — Progress Notes (Signed)
[facility]  PROGRESS NOTE  [name] KGY:[id number] DOB: 12/18/1940 DOA: 01/17/2022 PCP: Audley Hose, MD   Brief HPI:   82 year old female with medical history of HFpEF, aortic stenosis s/p TAVR 2020, diabetes mellitus type 2, hypertension, CKD stage IV, GERD, CVA, neuropathy, hyperlipidemia admitted with acute hypoxic respiratory failure in setting of RSV and heart failure.  Failed diuresis, nephrology consulted, started on hemodialysis.    Subjective   Patient seen and examined, developed respiratory distress after she came back from dialysis.  Was given labetalol 10 mg IV x 1 for hypotension and tachycardia.  Placed on BiPAP.   Assessment/Plan:     Acute hypoxemic respiratory failure -In setting of RSV, CHF exacerbation, volume overload -Possible bacterial superinfection -Procalcitonin 11 -Continue Solu-Medrol 40 g IV daily, DuoNeb every 6 hours -Continue ceftriaxone, Zithromax -Continue BiPAP  Acute on chronic diastolic heart failure -Initially started on Lasix with very minimal output -Nephrology consulted for hemodialysis for volume management  Hypertensive urgency -Blood pressure was elevated after she came back from dialysis -She was given 1 dose of labetalol 10 mg IV with improvement -Continue amlodipine, hydralazine  CKD stage IV -Presented with volume overload worrisome for progression to ESRD -No improvement with IV Lasix -Nephrology consulted for hemodialysis -Underwent left upper extremity brachiocephalic AV fistula creation with Dr. Carlis Abbott in 2020, fistula seems to have matured well. -Patient getting dialysis as per nephrology -social work is working to arrange outpatient HD  Chronic normocytic anemia -H&H has been stable -Transfuse for hemoglobin less than 7 -Iron saturation is low but IV iron is on hold due to infection and IV antibiotics  Diabetes mellitus type 2 -CBG well-controlled -Continue sliding scale insulin NovoLog -Continue  Solu-Medrol 40 mg IV daily    Medications     amLODipine  10 mg Oral Daily   Chlorhexidine Gluconate Cloth  6 each Topical Q0600   darbepoetin (ARANESP) injection - DIALYSIS  40 mcg Subcutaneous Q Fri-1800   feeding supplement  237 mL Oral BID BM   heparin injection (subcutaneous)  5,000 Units Subcutaneous Q8H   hydrALAZINE  100 mg Oral Q8H   insulin aspart  0-9 Units Subcutaneous TID AC & HS   methylPREDNISolone (SOLU-MEDROL) injection  40 mg Intravenous Daily   multivitamin  1 tablet Oral QHS   polyethylene glycol  17 g Oral Daily   senna-docusate  1 tablet Oral BID   venlafaxine XR  150 mg Oral Daily     Data Reviewed:   CBG:  Recent Labs  Lab 01/20/22 1130 01/20/22 1643 01/20/22 2156 01/21/22 0613 01/21/22 1322  GLUCAP 203* 189* 246* 181* 168*    SpO2: 96 % O2 Flow Rate (L/min): 2 L/min FiO2 (%): 40 %    Vitals:   01/21/22 1351 01/21/22 1400 01/21/22 1431 01/21/22 1629  BP:  (!) 166/83 (!) 147/57 (!) 145/54  Pulse: (!) 118 99 71 69  Resp: (!) 21 (!) 23 19 18   Temp:    98 F (36.7 C)  TempSrc:    Oral  SpO2: 100% 100% 95% 96%  Weight:      Height: 5\' 2"  (1.575 m)         Data Reviewed:  Basic Metabolic Panel: Recent Labs  Lab 01/17/22 0117 01/18/22 0333 01/19/22 0022 01/21/22 0059  NA 135 137 135 138  K 4.4 3.4* 3.4* 3.8  CL 100 102 99 96*  CO2 18* 19* 21* 24  GLUCOSE 206* 153* 184* 232*  BUN 60* 66* 58* 56*  CREATININE 3.75* 3.92* 3.16* 3.14*  CALCIUM 9.0 8.8* 8.4* 8.6*  MG  --  2.0 1.8  --   PHOS  --   --  4.2  --     CBC: Recent Labs  Lab 01/17/22 0117 01/18/22 0333 01/19/22 0022  WBC 15.7* 11.4* 12.6*  NEUTROABS 12.5*  --   --   HGB 9.8* 9.7* 9.0*  HCT 30.2* 30.0* 27.8*  MCV 84.4 84.5 84.2  PLT 291 297 283    LFT Recent Labs  Lab 01/17/22 0117 01/18/22 0333  AST 22 22  ALT 9 12  ALKPHOS 84 71  BILITOT 0.9 0.5  PROT 7.0 6.2*  ALBUMIN 3.7 2.5*     Antibiotics: Anti-infectives (From admission, onward)     Start     Dose/Rate Route Frequency Ordered Stop   01/21/22 1500  cefTRIAXone (ROCEPHIN) 2 g in sodium chloride 0.9 % 100 mL IVPB       Note to Pharmacy: Give to patient once she arrives back on unit from HD   2 g 200 mL/hr over 30 Minutes Intravenous  Once 01/21/22 0800     01/21/22 1400  azithromycin (ZITHROMAX) 500 mg in sodium chloride 0.9 % 250 mL IVPB        500 mg 250 mL/hr over 60 Minutes Intravenous  Once 01/21/22 0800 01/21/22 1559   01/18/22 0800  azithromycin (ZITHROMAX) 500 mg in sodium chloride 0.9 % 250 mL IVPB  Status:  Discontinued        500 mg 250 mL/hr over 60 Minutes Intravenous Every 24 hours 01/17/22 1805 01/21/22 0801   01/18/22 0754  cefTRIAXone (ROCEPHIN) 2 g in sodium chloride 0.9 % 100 mL IVPB  Status:  Discontinued        2 g 200 mL/hr over 30 Minutes Intravenous Every 24 hours 01/18/22 0754 01/21/22 0801   01/18/22 0700  cefTRIAXone (ROCEPHIN) 1 g in sodium chloride 0.9 % 100 mL IVPB  Status:  Discontinued        1 g 200 mL/hr over 30 Minutes Intravenous Every 24 hours 01/17/22 1805 01/18/22 0754   01/17/22 0645  cefTRIAXone (ROCEPHIN) 1 g in sodium chloride 0.9 % 100 mL IVPB        1 g 200 mL/hr over 30 Minutes Intravenous  Once 01/17/22 0644 01/17/22 0833   01/17/22 0645  azithromycin (ZITHROMAX) 500 mg in sodium chloride 0.9 % 250 mL IVPB        500 mg 250 mL/hr over 60 Minutes Intravenous  Once 01/17/22 0644 01/17/22 0940        DVT prophylaxis: Heparin  Code Status: Full code  Family Communication: No family at bedside   CONSULTS nephrology, PCCM   Objective    Physical Examination:   Appears in no acute distress Heart-S1-S2, regular Lungs clear to auscultation bilaterally  Status is: Inpatient:             [name]   [facility] If 7PM-7AM, please contact night-coverage at [email or web address], Office  [phone]   01/21/2022, 4:43 PM  LOS: 4 days

## 2022-01-22 ENCOUNTER — Inpatient Hospital Stay (HOSPITAL_COMMUNITY): Payer: Medicare PPO

## 2022-01-22 DIAGNOSIS — N171 Acute kidney failure with acute cortical necrosis: Secondary | ICD-10-CM | POA: Diagnosis not present

## 2022-01-22 DIAGNOSIS — E1122 Type 2 diabetes mellitus with diabetic chronic kidney disease: Secondary | ICD-10-CM | POA: Diagnosis not present

## 2022-01-22 DIAGNOSIS — N185 Chronic kidney disease, stage 5: Secondary | ICD-10-CM | POA: Diagnosis not present

## 2022-01-22 DIAGNOSIS — I5033 Acute on chronic diastolic (congestive) heart failure: Secondary | ICD-10-CM | POA: Diagnosis not present

## 2022-01-22 LAB — GLUCOSE, CAPILLARY
Glucose-Capillary: 150 mg/dL — ABNORMAL HIGH (ref 70–99)
Glucose-Capillary: 176 mg/dL — ABNORMAL HIGH (ref 70–99)
Glucose-Capillary: 231 mg/dL — ABNORMAL HIGH (ref 70–99)
Glucose-Capillary: 278 mg/dL — ABNORMAL HIGH (ref 70–99)

## 2022-01-22 LAB — PROCALCITONIN: Procalcitonin: 3.01 ng/mL

## 2022-01-22 MED ORDER — LOSARTAN POTASSIUM 25 MG PO TABS
25.0000 mg | ORAL_TABLET | Freq: Every day | ORAL | Status: DC
  Administered 2022-01-22 – 2022-01-23 (×2): 25 mg via ORAL
  Filled 2022-01-22 (×2): qty 1

## 2022-01-22 MED ORDER — ISOSORBIDE MONONITRATE ER 60 MG PO TB24
240.0000 mg | ORAL_TABLET | Freq: Every day | ORAL | Status: DC
  Administered 2022-01-22 – 2022-01-24 (×3): 240 mg via ORAL
  Filled 2022-01-22 (×3): qty 4

## 2022-01-22 MED ORDER — LABETALOL HCL 200 MG PO TABS
200.0000 mg | ORAL_TABLET | Freq: Two times a day (BID) | ORAL | Status: DC
  Administered 2022-01-22 – 2022-01-24 (×4): 200 mg via ORAL
  Filled 2022-01-22 (×5): qty 1

## 2022-01-22 MED ORDER — PREDNISONE 20 MG PO TABS
40.0000 mg | ORAL_TABLET | Freq: Every day | ORAL | Status: DC
  Administered 2022-01-23 – 2022-01-24 (×2): 40 mg via ORAL
  Filled 2022-01-22 (×2): qty 2

## 2022-01-22 MED ORDER — LIP MEDEX EX OINT: TOPICAL_OINTMENT | CUTANEOUS | Status: DC | PRN

## 2022-01-22 MED ORDER — LEVALBUTEROL HCL 0.63 MG/3ML IN NEBU: 0.6300 mg | INHALATION_SOLUTION | Freq: Four times a day (QID) | RESPIRATORY_TRACT | Status: DC | PRN

## 2022-01-22 MED ORDER — VENLAFAXINE HCL ER 75 MG PO CP24
75.0000 mg | ORAL_CAPSULE | Freq: Every day | ORAL | Status: DC
  Administered 2022-01-23 – 2022-01-24 (×2): 75 mg via ORAL
  Filled 2022-01-22 (×2): qty 1

## 2022-01-22 NOTE — Progress Notes (Signed)
Took her Off the BIPAP, placed on 2L 02. SPO2 @ 95%. Pt able to tolerate. Denies SOB. Plan of care ongoing.

## 2022-01-22 NOTE — Progress Notes (Signed)
Admit: 01/17/2022 LOS: 5  54F new ESRD, AHRF 2/2 RSV, Vol overload  Subjective:  HD #3 yesterday 3L UF Worsening respiratory status after dialysis requiring BiPAP, this morning is on nasal cannula CLIP completed to TCU No complaints this morning  01/05 0701 - 01/06 0700 In: 120 [P.O.:120] Out: 3000   Filed Weights   01/21/22 0618 01/21/22 1238 01/22/22 0335  Weight: 68.4 kg 65.7 kg 66.8 kg    Scheduled Meds:  amLODipine  10 mg Oral Daily   Chlorhexidine Gluconate Cloth  6 each Topical Q0600   darbepoetin (ARANESP) injection - DIALYSIS  40 mcg Subcutaneous Q Fri-1800   feeding supplement  237 mL Oral BID BM   heparin injection (subcutaneous)  5,000 Units Subcutaneous Q8H   hydrALAZINE  100 mg Oral Q8H   insulin aspart  0-9 Units Subcutaneous TID AC & HS   isosorbide mononitrate  240 mg Oral Daily   labetalol  200 mg Oral BID   multivitamin  1 tablet Oral QHS   polyethylene glycol  17 g Oral Daily   [START ON 01/23/2022] predniSONE  40 mg Oral Q breakfast   senna-docusate  1 tablet Oral BID   venlafaxine XR  150 mg Oral Daily   Continuous Infusions:  sodium chloride Stopped (01/18/22 1453)   ferric gluconate (FERRLECIT) IVPB Stopped (01/21/22 1118)   PRN Meds:.sodium chloride, acetaminophen **OR** acetaminophen, bisacodyl, guaiFENesin-dextromethorphan, hydrALAZINE, hydrOXYzine, levalbuterol, ondansetron (ZOFRAN) IV, mouth rinse, polyethylene glycol  Current Labs: reviewed   Latest Reference Range & Units 01/18/22 03:33  Saturation Ratios 10.4 - 31.8 % 5 (L)  Ferritin 11 - 307 ng/mL 138  (L): Data is abnormally low  Physical Exam:  Blood pressure (!) 167/59, pulse 85, temperature 98.4 F (36.9 C), temperature source Oral, resp. rate (!) 21, height 5\' 2"  (1.575 m), weight 66.8 kg, SpO2 96 %. Elderly female NAD Diminished in bases b/l RRR S/nt/nd AVF +B/T 1-2+ LEE Nonfocal  A New ESRD HD start 1/1 using LUE AVF CLIP completed, will be going to TCU initially For  now Cont HD on MWF schedule, next Tx 1/8 AHRF 2/2 RSV + pulm edema, now on 2L [place]; empiric ABX CTX/Azithro per [facility] AoC HFpEF, largely as above HTN: BPs elevated despite improvement in volume status Anemia: ESA weekly, sig IDA above, 1/1 BCx x2 NGTD started IV Fe 1/5 BMD, Ca and P stable, no meds, CTM  P HD 1/8: 3K, 23L UF, SBP > 174mmHg, 300. 3.5h, using AVF Start losartan for additional BP support Medication Issues; Preferred narcotic agents for pain control are hydromorphone, fentanyl, and methadone. Morphine should not be used.  Baclofen should be avoided Avoid oral sodium phosphate and magnesium citrate based laxatives / bowel preps    Pearson Grippe MD 01/22/2022, 9:31 AM  Recent Labs  Lab 01/18/22 0333 01/19/22 0022 01/21/22 0059  NA 137 135 138  K 3.4* 3.4* 3.8  CL 102 99 96*  CO2 19* 21* 24  GLUCOSE 153* 184* 232*  BUN 66* 58* 56*  CREATININE 3.92* 3.16* 3.14*  CALCIUM 8.8* 8.4* 8.6*  PHOS  --  4.2  --     Recent Labs  Lab 01/17/22 0117 01/18/22 0333 01/19/22 0022  WBC 15.7* 11.4* 12.6*  NEUTROABS 12.5*  --   --   HGB 9.8* 9.7* 9.0*  HCT 30.2* 30.0* 27.8*  MCV 84.4 84.5 84.2  PLT 291 297 283

## 2022-01-22 NOTE — Progress Notes (Signed)
[facility]  PROGRESS NOTE  [name] INO:[id number] DOB: 09-18-1940 DOA: 01/17/2022 PCP: Audley Hose, MD   Brief HPI:   82 year old female with medical history of HFpEF, aortic stenosis s/p TAVR 2020, diabetes mellitus type 2, hypertension, CKD stage IV, GERD, CVA, neuropathy, hyperlipidemia admitted with acute hypoxic respiratory failure in setting of RSV and heart failure.  Failed diuresis, nephrology consulted, started on hemodialysis.    Subjective   Patient seen and examined, breathing has improved today.  She is off BiPAP.  Currently requiring 2 L of oxygen via nasal cannula.   Assessment/Plan:     Acute hypoxemic respiratory failure -In setting of RSV, CHF exacerbation, volume overload -Possible bacterial superinfection -Procalcitonin 11 -She was started on Solu-Medrol 40 g IV daily, will discontinue Solu-Medrol and start prednisone taper  will change DuoNeb nebulizers to Xopenex every 6 hours as needed  -Completed 5 days of treatment with ceftriaxone, Zithromax -Continue BiPAP as needed -Chest x-ray done yesterday showed worsening of left lung infiltrate concerning for pneumonia, however patient recently completed 5 days of treatment with antibiotics -Will check procalcitonin; and repeat chest x-ray this morning  Acute on chronic diastolic heart failure -Initially started on Lasix with very minimal output -Nephrology consulted for hemodialysis for volume management -Restart Imdur to 40 mg daily  Hypertensive urgency -Blood pressure was elevated after she came back from dialysis -She was given 1 dose of labetalol 10 mg IV with improvement -Continue amlodipine, hydralazine -Start home medication of labetalol 200 mg p.o. twice daily -Also started on Imdur as above  CKD stage IV -Presented with volume overload worrisome for progression to ESRD -No improvement with IV Lasix -Nephrology consulted for hemodialysis -Underwent left upper extremity  brachiocephalic AV fistula creation with Dr. Carlis Abbott in 2020, fistula seems to have matured well. -Patient getting dialysis as per nephrology -social work is working to arrange outpatient HD  Chronic normocytic anemia -H&H has been stable -Transfuse for hemoglobin less than 7 -IV iron per nephrology  Diabetes mellitus type 2 -CBG well-controlled -Continue sliding scale insulin NovoLog     Medications     amLODipine  10 mg Oral Daily   Chlorhexidine Gluconate Cloth  6 each Topical Q0600   darbepoetin (ARANESP) injection - DIALYSIS  40 mcg Subcutaneous Q Fri-1800   feeding supplement  237 mL Oral BID BM   heparin injection (subcutaneous)  5,000 Units Subcutaneous Q8H   hydrALAZINE  100 mg Oral Q8H   insulin aspart  0-9 Units Subcutaneous TID AC & HS   isosorbide mononitrate  240 mg Oral Daily   labetalol  200 mg Oral BID   methylPREDNISolone (SOLU-MEDROL) injection  40 mg Intravenous Daily   multivitamin  1 tablet Oral QHS   polyethylene glycol  17 g Oral Daily   senna-docusate  1 tablet Oral BID   venlafaxine XR  150 mg Oral Daily     Data Reviewed:   CBG:  Recent Labs  Lab 01/21/22 0613 01/21/22 1322 01/21/22 1653 01/21/22 2151 01/22/22 0623  GLUCAP 181* 168* 174* 156* 150*    SpO2: 96 % O2 Flow Rate (L/min): 2 L/min FiO2 (%): 40 %    Vitals:   01/22/22 0335 01/22/22 0350 01/22/22 0400 01/22/22 0753  BP:   (!) 169/59 (!) 167/59  Pulse:   84 85  Resp:   20 (!) 21  Temp: 98.1 F (36.7 C)   98.4 F (36.9 C)  TempSrc:    Oral  SpO2:  94% (S) 95% 96%  Weight: 66.8 kg     Height:          Data Reviewed:  Basic Metabolic Panel: Recent Labs  Lab 01/17/22 0117 01/18/22 0333 01/19/22 0022 01/21/22 0059  NA 135 137 135 138  K 4.4 3.4* 3.4* 3.8  CL 100 102 99 96*  CO2 18* 19* 21* 24  GLUCOSE 206* 153* 184* 232*  BUN 60* 66* 58* 56*  CREATININE 3.75* 3.92* 3.16* 3.14*  CALCIUM 9.0 8.8* 8.4* 8.6*  MG  --  2.0 1.8  --   PHOS  --   --  4.2  --      CBC: Recent Labs  Lab 01/17/22 0117 01/18/22 0333 01/19/22 0022  WBC 15.7* 11.4* 12.6*  NEUTROABS 12.5*  --   --   HGB 9.8* 9.7* 9.0*  HCT 30.2* 30.0* 27.8*  MCV 84.4 84.5 84.2  PLT 291 297 283    LFT Recent Labs  Lab 01/17/22 0117 01/18/22 0333  AST 22 22  ALT 9 12  ALKPHOS 84 71  BILITOT 0.9 0.5  PROT 7.0 6.2*  ALBUMIN 3.7 2.5*     Antibiotics: Anti-infectives (From admission, onward)    Start     Dose/Rate Route Frequency Ordered Stop   01/21/22 1500  cefTRIAXone (ROCEPHIN) 2 g in sodium chloride 0.9 % 100 mL IVPB       Note to Pharmacy: Give to patient once she arrives back on unit from HD   2 g 200 mL/hr over 30 Minutes Intravenous  Once 01/21/22 0800 01/21/22 1723   01/21/22 1400  azithromycin (ZITHROMAX) 500 mg in sodium chloride 0.9 % 250 mL IVPB        500 mg 250 mL/hr over 60 Minutes Intravenous  Once 01/21/22 0800 01/21/22 1559   01/18/22 0800  azithromycin (ZITHROMAX) 500 mg in sodium chloride 0.9 % 250 mL IVPB  Status:  Discontinued        500 mg 250 mL/hr over 60 Minutes Intravenous Every 24 hours 01/17/22 1805 01/21/22 0801   01/18/22 0754  cefTRIAXone (ROCEPHIN) 2 g in sodium chloride 0.9 % 100 mL IVPB  Status:  Discontinued        2 g 200 mL/hr over 30 Minutes Intravenous Every 24 hours 01/18/22 0754 01/21/22 0801   01/18/22 0700  cefTRIAXone (ROCEPHIN) 1 g in sodium chloride 0.9 % 100 mL IVPB  Status:  Discontinued        1 g 200 mL/hr over 30 Minutes Intravenous Every 24 hours 01/17/22 1805 01/18/22 0754   01/17/22 0645  cefTRIAXone (ROCEPHIN) 1 g in sodium chloride 0.9 % 100 mL IVPB        1 g 200 mL/hr over 30 Minutes Intravenous  Once 01/17/22 0644 01/17/22 0833   01/17/22 0645  azithromycin (ZITHROMAX) 500 mg in sodium chloride 0.9 % 250 mL IVPB        500 mg 250 mL/hr over 60 Minutes Intravenous  Once 01/17/22 0644 01/17/22 0940        DVT prophylaxis: Heparin  Code Status: Full code  Family Communication: No family at  bedside   CONSULTS nephrology, PCCM   Objective    Physical Examination:  Appears in no acute distress Heart S1-S2, regular Lungs bilateral rhonchi auscultated Abdomen is soft, nontender No edema in the lower extremities    Status is: Inpatient:             [name]   [facility] If 7PM-7AM, please contact night-coverage at [email or web address], Office  [phone]   01/22/2022, 8:50 AM  LOS: 5 days

## 2022-01-22 NOTE — Progress Notes (Signed)
Mobility Specialist: Progress Note   01/22/22 1502  Mobility  Activity Ambulated with assistance in room  Level of Assistance Contact guard assist, steadying assist  Assistive Device Front wheel walker  Distance Ambulated (ft) 70 ft  Activity Response Tolerated well  Mobility Referral Yes  $Mobility charge 1 Mobility   Pre-Mobility: 72 HR, 96% SpO2 Post-Mobility: 78 HR, 95% SpO2  Pt received in the chair and agreeable to mobility. Mod I to stand and contact guard during ambulation. Ambulated on 2 L/min [place]. No c/o throughout. Encouraged hallway ambulation but pt declining at this time. Pt to bed after session with call bell and phone at her side.   [place] Sindi Beckworth Mobility Specialist Please contact via SecureChat or Rehab office at [phone]

## 2022-01-23 DIAGNOSIS — N171 Acute kidney failure with acute cortical necrosis: Secondary | ICD-10-CM | POA: Diagnosis not present

## 2022-01-23 DIAGNOSIS — I5033 Acute on chronic diastolic (congestive) heart failure: Secondary | ICD-10-CM | POA: Diagnosis not present

## 2022-01-23 DIAGNOSIS — N185 Chronic kidney disease, stage 5: Secondary | ICD-10-CM | POA: Diagnosis not present

## 2022-01-23 DIAGNOSIS — E1122 Type 2 diabetes mellitus with diabetic chronic kidney disease: Secondary | ICD-10-CM | POA: Diagnosis not present

## 2022-01-23 LAB — GLUCOSE, CAPILLARY
Glucose-Capillary: 227 mg/dL — ABNORMAL HIGH (ref 70–99)
Glucose-Capillary: 276 mg/dL — ABNORMAL HIGH (ref 70–99)
Glucose-Capillary: 395 mg/dL — ABNORMAL HIGH (ref 70–99)
Glucose-Capillary: 397 mg/dL — ABNORMAL HIGH (ref 70–99)
Glucose-Capillary: 262 mg/dL — ABNORMAL HIGH (ref 70–99)

## 2022-01-23 LAB — CULTURE, BLOOD (ROUTINE X 2)
Culture: NO GROWTH
Culture: NO GROWTH

## 2022-01-23 MED ORDER — GLUCERNA SHAKE PO LIQD
237.0000 mL | Freq: Two times a day (BID) | ORAL | Status: DC
  Administered 2022-01-23: 237 mL via ORAL

## 2022-01-23 MED ORDER — DARBEPOETIN ALFA 40 MCG/0.4ML IJ SOSY
40.0000 ug | PREFILLED_SYRINGE | INTRAMUSCULAR | Status: DC
  Administered 2022-01-23: 40 ug via SUBCUTANEOUS
  Filled 2022-01-23: qty 0.4

## 2022-01-23 MED ORDER — INSULIN ASPART 100 UNIT/ML IJ SOLN: 0.0000 [IU] | Freq: Three times a day (TID) | INTRAMUSCULAR | Status: DC

## 2022-01-23 MED ORDER — AMLODIPINE BESYLATE 5 MG PO TABS
5.0000 mg | ORAL_TABLET | Freq: Every day | ORAL | Status: DC
  Administered 2022-01-23 – 2022-01-24 (×2): 5 mg via ORAL
  Filled 2022-01-23 (×2): qty 1

## 2022-01-23 MED ORDER — INSULIN ASPART 100 UNIT/ML IJ SOLN
0.0000 [IU] | Freq: Three times a day (TID) | INTRAMUSCULAR | Status: DC
  Administered 2022-01-23: 20 [IU] via SUBCUTANEOUS
  Administered 2022-01-24: 15 [IU] via SUBCUTANEOUS
  Administered 2022-01-24: 11 [IU] via SUBCUTANEOUS

## 2022-01-23 MED ORDER — INSULIN ASPART 100 UNIT/ML IJ SOLN
0.0000 [IU] | Freq: Every day | INTRAMUSCULAR | Status: DC
  Administered 2022-01-23: 3 [IU] via SUBCUTANEOUS

## 2022-01-23 NOTE — Progress Notes (Signed)
Nurse tech reported to RN that patient had just had some cake before taking dinner blood sugar. Blood sugar 395. MD notified and verbal orders given to recheck blood sugar in an hour.

## 2022-01-23 NOTE — Progress Notes (Signed)
Admit: 01/17/2022 LOS: 6  82F new ESRD, AHRF 2/2 RSV, Vol overload  Subjective:  No sig interval events On 3L [place], no BiPAP required overnight No c/o this AM CLIP completed to TCU  01/06 0701 - 01/07 0700 In: -  Out: 200 [Urine:200]  Filed Weights   01/21/22 1238 01/22/22 0335 01/23/22 0420  Weight: 65.7 kg 66.8 kg 66.1 kg    Scheduled Meds:  amLODipine  5 mg Oral Daily   Chlorhexidine Gluconate Cloth  6 each Topical Q0600   darbepoetin (ARANESP) injection - DIALYSIS  40 mcg Subcutaneous Q Fri-1800   feeding supplement  237 mL Oral BID BM   heparin injection (subcutaneous)  5,000 Units Subcutaneous Q8H   hydrALAZINE  100 mg Oral Q8H   insulin aspart  0-9 Units Subcutaneous TID AC & HS   isosorbide mononitrate  240 mg Oral Daily   labetalol  200 mg Oral BID   losartan  25 mg Oral QHS   multivitamin  1 tablet Oral QHS   polyethylene glycol  17 g Oral Daily   predniSONE  40 mg Oral Q breakfast   senna-docusate  1 tablet Oral BID   venlafaxine XR  75 mg Oral Daily   Continuous Infusions:  sodium chloride Stopped (01/18/22 1453)   ferric gluconate (FERRLECIT) IVPB Stopped (01/21/22 1118)   PRN Meds:.sodium chloride, acetaminophen **OR** acetaminophen, bisacodyl, guaiFENesin-dextromethorphan, hydrALAZINE, hydrOXYzine, levalbuterol, lip balm, ondansetron (ZOFRAN) IV, mouth rinse, polyethylene glycol  Current Labs: reviewed   Latest Reference Range & Units 01/18/22 03:33  Saturation Ratios 10.4 - 31.8 % 5 (L)  Ferritin 11 - 307 ng/mL 138  (L): Data is abnormally low  Physical Exam:  Blood pressure (!) 147/59, pulse 79, temperature 97.6 F (36.4 C), temperature source Oral, resp. rate 16, height 5\' 2"  (1.575 m), weight 66.1 kg, SpO2 97 %. Elderly female NAD Diminished in bases b/l RRR S/nt/nd AVF +B/T 1-2+ LEE Nonfocal  A New ESRD HD start 1/1 using LUE AVF CLIP completed, will be going to TCU initially For now Cont HD on MWF schedule, next Tx 1/8 Discharge pending  stable respiratory status AHRF 2/2 RSV + pulm edema, now on 3L ; s/p empiric ABX CTX/Azithro per [facility] AoC HFpEF, largely as above HTN: BPs elevated but seem improved after addition of ARB Anemia: ESA weekly, sig IDA above, 1/1 BCx x2 NGTD started IV Fe 1/5 BMD, Ca and P stable, no meds, CTM  P HD 1/8 if inpatient: 3K, 2-3L UF, SBP > 168mmHg, BFR 300. 3.5h, using AVF, no heparin Medication Issues; Preferred narcotic agents for pain control are hydromorphone, fentanyl, and methadone. Morphine should not be used.  Baclofen should be avoided Avoid oral sodium phosphate and magnesium citrate based laxatives / bowel preps    Pearson Grippe MD 01/23/2022, 9:11 AM  Recent Labs  Lab 01/18/22 0333 01/19/22 0022 01/21/22 0059  NA 137 135 138  K 3.4* 3.4* 3.8  CL 102 99 96*  CO2 19* 21* 24  GLUCOSE 153* 184* 232*  BUN 66* 58* 56*  CREATININE 3.92* 3.16* 3.14*  CALCIUM 8.8* 8.4* 8.6*  PHOS  --  4.2  --     Recent Labs  Lab 01/17/22 0117 01/18/22 0333 01/19/22 0022  WBC 15.7* 11.4* 12.6*  NEUTROABS 12.5*  --   --   HGB 9.8* 9.7* 9.0*  HCT 30.2* 30.0* 27.8*  MCV 84.4 84.5 84.2  PLT 291 297 283

## 2022-01-23 NOTE — Progress Notes (Signed)
SATURATION QUALIFICATIONS: (This note is used to comply with regulatory documentation for home oxygen)  Patient Saturations on Room Air at Rest = 93%  Patient Saturations on Room Air while Ambulating = 90%  Patient does not require oxygen.

## 2022-01-23 NOTE — Plan of Care (Signed)
  Problem: Clinical Measurements: Goal: Respiratory complications will improve Outcome: Progressing Goal: Cardiovascular complication will be avoided Outcome: Progressing   Problem: Activity: Goal: Risk for activity intolerance will decrease Outcome: Progressing   Problem: Nutrition: Goal: Adequate nutrition will be maintained Outcome: Progressing   Problem: Elimination: Goal: Will not experience complications related to urinary retention Outcome: Progressing   

## 2022-01-23 NOTE — Progress Notes (Signed)
Bipap not needed at this time. °

## 2022-01-23 NOTE — Evaluation (Signed)
Physical Therapy Evaluation & Discharge Patient Details Name: [name] MRN: [id number] DOB: [birth year] Today's Date: 01/23/2022  History of Present Illness  Pt is an 82 y.o. female admitted 01/17/22 with acute hypoxic respiratory failure secondary to RSV, CHF exacerbation. PMH includes HFpEF, aortic stenosis s/p TAVR, DM2, HTN, CKD IV, GERD, CVA, neuropathy, admission for COVID (08/2021).   Clinical Impression  Patient evaluated by Physical Therapy with no further acute PT needs identified. PTA, pt mod indep with use of rollator for community mobility; pt typically lives alone but has been staying at brother's due to recent falls and he does not have stairs. Today, pt mod indep with rollator; SpO2 >/90% on RA. All education has been completed and the patient has no further questions. Pt to continue ambulating with mobility specialist. Acute PT is signing off. Thank you for this referral.     Recommendations for follow up therapy are one component of a multi-disciplinary discharge planning process, led by the attending physician.  Recommendations may be updated based on patient status, additional functional criteria and insurance authorization.  Follow Up Recommendations No PT follow up      Assistance Recommended at Discharge Intermittent Supervision/Assistance  Patient can return home with the following  Assistance with cooking/housework;Assist for transportation;Help with stairs or ramp for entrance    Equipment Recommendations None recommended by PT  Recommendations for Other Services  Mobility Specialist     Functional Status Assessment       Precautions / Restrictions Precautions Precautions: Other (comment) Precaution Comments: watch SpO2 (does not wear baseline) Restrictions Weight Bearing Restrictions: No      Mobility  Bed Mobility               General bed mobility comments: received sitting in recliner    Transfers Overall transfer level: Modified  independent Equipment used: Rollator (4 wheels)               General transfer comment: locking brakes appropraitely without cues    Ambulation/Gait Ambulation/Gait assistance: Modified independent (Device/Increase time) Gait Distance (Feet): 200 Feet Assistive device: Rollator (4 wheels) Gait Pattern/deviations: Step-through pattern, Decreased stride length Gait velocity: Decreased     General Gait Details: slow, guarded gait mod indep with rollator; stopping to talk at times; SpO2 >/90% on RA  Stairs            Wheelchair Mobility    Modified Rankin (Stroke Patients Only)       Balance Overall balance assessment: No apparent balance deficits (not formally assessed), Needs assistance   Sitting balance-Leahy Scale: Good     Standing balance support: No upper extremity supported Standing balance-Leahy Scale: Fair Standing balance comment: can static stand without UE support, preference for rollator                             Pertinent Vitals/Pain Pain Assessment Pain Assessment: No/denies pain    Home Living Family/patient expects to be discharged to:: Private residence Living Arrangements: Alone Available Help at Discharge: Family;Available 24 hours/day Type of Home: House Home Access: Level entry       Home Layout: One level Home Equipment: Conservation officer, nature (2 wheels);Shower seat Additional Comments: pt typically lives alone, but has been staying with brother since admission 08/2021 for COVID. home set-up for brother's home    Prior Function Prior Level of Function : Independent/Modified Independent  Mobility Comments: mod indep with use of rollator outside of home. has not gotten back to water aerobics or as much exercise since admission 08/2021 for COVID; recent fall       Hand Dominance        Extremity/Trunk Assessment   Upper Extremity Assessment Upper Extremity Assessment: Overall WFL for tasks assessed     Lower Extremity Assessment Lower Extremity Assessment: Overall WFL for tasks assessed       Communication   Communication: No difficulties  Cognition Arousal/Alertness: Awake/alert Behavior During Therapy: WFL for tasks assessed/performed Overall Cognitive Status: Within Functional Limits for tasks assessed                                          General Comments General comments (skin integrity, edema, etc.): SpO2 >/90% on RA with activity. educ re: precautions, activity recommendations, DME use, fall risk reduction    Exercises     Assessment/Plan    PT Assessment Patient does not need any further PT services  PT Problem List         PT Treatment Interventions      PT Goals (Current goals can be found in the Care Plan section)  Acute Rehab PT Goals PT Goal Formulation: All assessment and education complete, DC therapy    Frequency       Co-evaluation               AM-PAC PT "6 Clicks" Mobility  Outcome Measure Help needed turning from your back to your side while in a flat bed without using bedrails?: None Help needed moving from lying on your back to sitting on the side of a flat bed without using bedrails?: None Help needed moving to and from a bed to a chair (including a wheelchair)?: None Help needed standing up from a chair using your arms (e.g., wheelchair or bedside chair)?: None Help needed to walk in hospital room?: None Help needed climbing 3-5 steps with a railing? : A Little 6 Click Score: 23    End of Session Equipment Utilized During Treatment: Gait belt Activity Tolerance: Patient tolerated treatment well Patient left: in chair;with call bell/phone within reach Nurse Communication: Mobility status PT Visit Diagnosis: Other abnormalities of gait and mobility (R26.89)    Time: 1425-1445 PT Time Calculation (min) (ACUTE ONLY): 20 min   Charges:   PT Evaluation $PT Eval Low Complexity: [place], PT, DPT Acute Rehabilitation Services  Personal: [place] [facility] Office: [place] 01/23/2022, 3:27 PM

## 2022-01-23 NOTE — Progress Notes (Signed)
[facility]  PROGRESS NOTE  [name] ZOX:[id number] DOB: 1940-04-11 DOA: 01/17/2022 PCP: Audley Hose, MD   Brief HPI:   82 year old female with medical history of HFpEF, aortic stenosis s/p TAVR 2020, diabetes mellitus type 2, hypertension, CKD stage IV, GERD, CVA, neuropathy, hyperlipidemia admitted with acute hypoxic respiratory failure in setting of RSV and heart failure.  Failed diuresis, nephrology consulted, started on hemodialysis.    Subjective   Patient seen and examined, breathing has improved.  Currently down to 2 L/min of oxygen via nasal cannula.  Blood pressure is well-controlled   Assessment/Plan:     Acute hypoxemic respiratory failure -In setting of RSV, CHF exacerbation, volume overload -Possible bacterial superinfection -Procalcitonin 11 -She was started on Solu-Medrol 40 g IV daily, will discontinue Solu-Medrol and start prednisone taper  will change DuoNeb nebulizers to Xopenex every 6 hours as needed  -Completed 5 days of treatment with ceftriaxone, Zithromax -Continue BiPAP as needed -Chest x-ray done yesterday showed worsening of left lung infiltrate concerning for pneumonia, however patient recently completed 5 days of treatment with antibiotics -Procalcitonin 3.1 -Patient has significantly improved, will wean off oxygen to room air -Check pulse ox on ambulation  Acute on chronic diastolic heart failure -Initially started on Lasix with very minimal output -Nephrology consulted for hemodialysis for volume management -Restarted Imdur to 40 mg daily  Hypertensive urgency -Blood pressure was elevated after she came back from dialysis -She was given 1 dose of labetalol 10 mg IV with improvement -Continue amlodipine, hydralazine -Started home medication of labetalol 200 mg p.o. twice daily -Also started on Imdur as above  CKD stage IV -Presented with volume overload worrisome for progression to ESRD -No improvement with IV  Lasix -Nephrology consulted for hemodialysis -Underwent left upper extremity brachiocephalic AV fistula creation with Dr. Carlis Abbott in 2020, fistula seems to have matured well. -Patient getting dialysis as per nephrology -social work is working to arrange outpatient HD  Chronic normocytic anemia -H&H has been stable -Transfuse for hemoglobin less than 7 -IV iron per nephrology  Diabetes mellitus type 2 -CBG well-controlled -Continue sliding scale insulin NovoLog     Medications     amLODipine  5 mg Oral Daily   Chlorhexidine Gluconate Cloth  6 each Topical Q0600   darbepoetin (ARANESP) injection - DIALYSIS  40 mcg Subcutaneous Q Fri-1800   feeding supplement  237 mL Oral BID BM   heparin injection (subcutaneous)  5,000 Units Subcutaneous Q8H   hydrALAZINE  100 mg Oral Q8H   insulin aspart  0-9 Units Subcutaneous TID AC & HS   isosorbide mononitrate  240 mg Oral Daily   labetalol  200 mg Oral BID   losartan  25 mg Oral QHS   multivitamin  1 tablet Oral QHS   polyethylene glycol  17 g Oral Daily   predniSONE  40 mg Oral Q breakfast   senna-docusate  1 tablet Oral BID   venlafaxine XR  75 mg Oral Daily     Data Reviewed:   CBG:  Recent Labs  Lab 01/22/22 0623 01/22/22 1056 01/22/22 1523 01/22/22 2138 01/23/22 0602  GLUCAP 150* 176* 231* 278* 227*    SpO2: 97 % O2 Flow Rate (L/min): 3 L/min FiO2 (%): 40 %    Vitals:   01/23/22 0300 01/23/22 0420 01/23/22 0629 01/23/22 0733  BP: (!) 155/75  (!) 142/53 (!) 147/59  Pulse: 82 69  79  Resp: 20 17  16   Temp: 97.6 F (36.4 C)   97.6  F (36.4 C)  TempSrc: Oral   Oral  SpO2: 98% 98%  97%  Weight:  66.1 kg    Height:          Data Reviewed:  Basic Metabolic Panel: Recent Labs  Lab 01/17/22 0117 01/18/22 0333 01/19/22 0022 01/21/22 0059  NA 135 137 135 138  K 4.4 3.4* 3.4* 3.8  CL 100 102 99 96*  CO2 18* 19* 21* 24  GLUCOSE 206* 153* 184* 232*  BUN 60* 66* 58* 56*  CREATININE 3.75* 3.92* 3.16*  3.14*  CALCIUM 9.0 8.8* 8.4* 8.6*  MG  --  2.0 1.8  --   PHOS  --   --  4.2  --     CBC: Recent Labs  Lab 01/17/22 0117 01/18/22 0333 01/19/22 0022  WBC 15.7* 11.4* 12.6*  NEUTROABS 12.5*  --   --   HGB 9.8* 9.7* 9.0*  HCT 30.2* 30.0* 27.8*  MCV 84.4 84.5 84.2  PLT 291 297 283    LFT Recent Labs  Lab 01/17/22 0117 01/18/22 0333  AST 22 22  ALT 9 12  ALKPHOS 84 71  BILITOT 0.9 0.5  PROT 7.0 6.2*  ALBUMIN 3.7 2.5*     Antibiotics: Anti-infectives (From admission, onward)    Start     Dose/Rate Route Frequency Ordered Stop   01/21/22 1500  cefTRIAXone (ROCEPHIN) 2 g in sodium chloride 0.9 % 100 mL IVPB       Note to Pharmacy: Give to patient once she arrives back on unit from HD   2 g 200 mL/hr over 30 Minutes Intravenous  Once 01/21/22 0800 01/21/22 1723   01/21/22 1400  azithromycin (ZITHROMAX) 500 mg in sodium chloride 0.9 % 250 mL IVPB        500 mg 250 mL/hr over 60 Minutes Intravenous  Once 01/21/22 0800 01/21/22 1559   01/18/22 0800  azithromycin (ZITHROMAX) 500 mg in sodium chloride 0.9 % 250 mL IVPB  Status:  Discontinued        500 mg 250 mL/hr over 60 Minutes Intravenous Every 24 hours 01/17/22 1805 01/21/22 0801   01/18/22 0754  cefTRIAXone (ROCEPHIN) 2 g in sodium chloride 0.9 % 100 mL IVPB  Status:  Discontinued        2 g 200 mL/hr over 30 Minutes Intravenous Every 24 hours 01/18/22 0754 01/21/22 0801   01/18/22 0700  cefTRIAXone (ROCEPHIN) 1 g in sodium chloride 0.9 % 100 mL IVPB  Status:  Discontinued        1 g 200 mL/hr over 30 Minutes Intravenous Every 24 hours 01/17/22 1805 01/18/22 0754   01/17/22 0645  cefTRIAXone (ROCEPHIN) 1 g in sodium chloride 0.9 % 100 mL IVPB        1 g 200 mL/hr over 30 Minutes Intravenous  Once 01/17/22 0644 01/17/22 0833   01/17/22 0645  azithromycin (ZITHROMAX) 500 mg in sodium chloride 0.9 % 250 mL IVPB        500 mg 250 mL/hr over 60 Minutes Intravenous  Once 01/17/22 0644 01/17/22 0940        DVT  prophylaxis: Heparin  Code Status: Full code  Family Communication: No family at bedside   CONSULTS nephrology, PCCM   Objective    Physical Examination:  Appears in no acute distress Heart S1-S2, regular Lungs scattered rhonchi bilaterally Abdomen is soft, nontender, no organomegaly    Status is: Inpatient:             [name]  [facility] If 7PM-7AM, please contact night-coverage at [email or web address], Office  [phone]   01/23/2022, 9:08 AM  LOS: 6 days

## 2022-01-23 NOTE — Progress Notes (Signed)
After checking patients vital signs several times tonight patient had normal B/'s and norma; HR. Her 2200 meds I questioned prior to giving them to her. I called Dr. Bridgett Larsson prior to giving them and asked for clarification of meds and the amounts prescribed. Dr. Bridgett Larsson told me to give Hydralazine and her Cozaar, hold the Labetalol. He stated these were her home doses and continue to monitor her vital signs. Patient was given her meds and her B/P has remained stable. Patient voiced no complaints. Will continue to monitor closely.

## 2022-01-24 DIAGNOSIS — N185 Chronic kidney disease, stage 5: Secondary | ICD-10-CM | POA: Diagnosis not present

## 2022-01-24 DIAGNOSIS — N171 Acute kidney failure with acute cortical necrosis: Secondary | ICD-10-CM | POA: Diagnosis not present

## 2022-01-24 DIAGNOSIS — N184 Chronic kidney disease, stage 4 (severe): Secondary | ICD-10-CM

## 2022-01-24 DIAGNOSIS — E1122 Type 2 diabetes mellitus with diabetic chronic kidney disease: Secondary | ICD-10-CM | POA: Diagnosis not present

## 2022-01-24 DIAGNOSIS — I5033 Acute on chronic diastolic (congestive) heart failure: Secondary | ICD-10-CM | POA: Diagnosis not present

## 2022-01-24 LAB — GLUCOSE, CAPILLARY
Glucose-Capillary: 261 mg/dL — ABNORMAL HIGH (ref 70–99)
Glucose-Capillary: 334 mg/dL — ABNORMAL HIGH (ref 70–99)

## 2022-01-24 MED ORDER — INSULIN DEGLUDEC 100 UNIT/ML ~~LOC~~ SOLN: 10.0000 [IU] | Freq: Every day | SUBCUTANEOUS | 1 refills | Status: AC

## 2022-01-24 MED ORDER — PREDNISONE 10 MG PO TABS: ORAL_TABLET | ORAL | 0 refills | Status: DC

## 2022-01-24 MED ORDER — AMLODIPINE BESYLATE 5 MG PO TABS: 5.0000 mg | ORAL_TABLET | Freq: Every day | ORAL | 2 refills | Status: DC

## 2022-01-24 MED ORDER — LOSARTAN POTASSIUM 25 MG PO TABS: 25.0000 mg | ORAL_TABLET | Freq: Every day | ORAL | 2 refills | Status: DC

## 2022-01-24 MED ORDER — ALBUTEROL SULFATE HFA 108 (90 BASE) MCG/ACT IN AERS: 2.0000 | INHALATION_SPRAY | Freq: Four times a day (QID) | RESPIRATORY_TRACT | 0 refills | Status: DC | PRN

## 2022-01-24 NOTE — TOC Transition Note (Signed)
Transition of Care [facility]) - CM/SW Discharge Note   Patient Details  Name: [name] MRN: [id number] Date of Birth: [birth year]  [facility]) CM/SW Contact:  Coralee Pesa, [place] Phone Number: 01/24/2022, 3:36 PM   Clinical Narrative:     CSW notified family had additional questions regarding application for transport. CSW provided assistance with completing the application. Pt to DC home with family supports. TOC to sign off.          Patient Goals and CMS Choice      Discharge Placement                         Discharge Plan and Services Additional resources added to the After Visit Summary for                                       Social Determinants of Health (SDOH) Interventions SDOH Screenings   Depression (PHQ2-9): Medium Risk (03/20/2020)  Tobacco Use: Low Risk  (01/17/2022)     Readmission Risk Interventions    09/09/2021   10:21 AM  Readmission Risk Prevention Plan  Transportation Screening Complete  PCP or Specialist Appt within 5-7 Days Complete  Home Care Screening Complete  Medication Review (RN CM) Complete

## 2022-01-24 NOTE — Progress Notes (Signed)
Patient ID: [name], female   DOB: [birth year], 82 y.o.   MRN: [id number] [facility] Progress Note   Assessment/ Plan:   1.  Acute hypoxic respiratory failure: Secondary to combination of pulmonary edema and RSV infection.  Decreased oxygenation requirements and now on room air without problems.  Status post completion of empiric antimicrobial therapy with ceftriaxone/azithromycin and on hemodialysis for volume management. 2. ESRD: New start on hemodialysis via left brachiocephalic fistula.  Plans noted for discharge at transitional care unit after discharge (plausibly later today based on assessment by [facility]). 3. Anemia: Without overt blood loss, continue to monitor on ESA and intravenous iron. 4. CKD-MBD: Calcium and phosphorus levels currently at goal.  Continue to monitor as an outpatient to determine need for binders. 5. Nutrition: With malnutrition associated with progressive azotemia/uremia and anticipate to improve with initiation of hemodialysis.  Reminded on renal diet and on renal vitamin. 6. Hypertension: Blood pressure under decent control, continue to monitor with ultrafiltration on HD.  Subjective:   Reports to be feeling better with improvement of shortness of breath/leg swelling.  Appetite improving slowly.   Objective:   BP (!) 141/48 (BP Location: Right Arm)   Pulse 72   Temp 97.6 F (36.4 C) (Oral)   Resp 18   Ht 5\' 2"  (1.575 m)   Wt 65.9 kg   SpO2 96%   BMI 26.57 kg/m   Physical Exam: Gen: Resting comfortably in bed, watching television CVS: Pulse regular rhythm, normal rate, S1 and S2 normal Resp: Decreased inspiratory effort with reduced breath sounds bilaterally.  No rales/rhonchi Abd: Soft, flat, nontender, bowel sounds normal Ext: Trace-1+ bilateral lower extremity edema.  Left brachiocephalic fistula with palpable thrill  Labs: BMET Recent Labs  Lab 01/18/22 0333 01/19/22 0022 01/21/22 0059  NA 137 135 138  K 3.4* 3.4* 3.8  CL 102  99 96*  CO2 19* 21* 24  GLUCOSE 153* 184* 232*  BUN 66* 58* 56*  CREATININE 3.92* 3.16* 3.14*  CALCIUM 8.8* 8.4* 8.6*  PHOS  --  4.2  --    CBC Recent Labs  Lab 01/18/22 0333 01/19/22 0022  WBC 11.4* 12.6*  HGB 9.7* 9.0*  HCT 30.0* 27.8*  MCV 84.5 84.2  PLT 297 283      Medications:     amLODipine  5 mg Oral Daily   Chlorhexidine Gluconate Cloth  6 each Topical Q0600   darbepoetin (ARANESP) injection - DIALYSIS  40 mcg Subcutaneous Q Sun-1800   feeding supplement (GLUCERNA SHAKE)  237 mL Oral BID BM   heparin injection (subcutaneous)  5,000 Units Subcutaneous Q8H   hydrALAZINE  100 mg Oral Q8H   insulin aspart  0-20 Units Subcutaneous TID WC   insulin aspart  0-5 Units Subcutaneous QHS   isosorbide mononitrate  240 mg Oral Daily   labetalol  200 mg Oral BID   losartan  25 mg Oral QHS   multivitamin  1 tablet Oral QHS   polyethylene glycol  17 g Oral Daily   predniSONE  40 mg Oral Q breakfast   senna-docusate  1 tablet Oral BID   venlafaxine XR  75 mg Oral Daily   Elmarie Shiley, MD 01/24/2022, 8:14 AM

## 2022-01-24 NOTE — Progress Notes (Signed)
Mobility Specialist Progress Note    01/24/22 1143  Mobility  Activity Ambulated with assistance in hallway  Level of Assistance Contact guard assist, steadying assist  Assistive Device Four wheel walker  Distance Ambulated (ft) 90 ft  Activity Response Tolerated well  Mobility Referral Yes  $Mobility charge 1 Mobility   Pre-Mobility: 67 HR, 98% SpO2 During Mobility: 94% SpO2 Post-Mobility: 63 HR, 96% SpO2  Pt received in chair and agreeable. C/o feeling off. Returned to chair with call bell in reach. Tolerated on RA.  Hildred Alamin Mobility Specialist  Please Psychologist, sport and exercise or Rehab Office at [phone]

## 2022-01-24 NOTE — Progress Notes (Signed)
Hemodialysis nurse claimed that she is not going for HD today and be done in the center tom. Plan to dc her home today. Patient made aware.

## 2022-01-24 NOTE — Progress Notes (Addendum)
Contacted by HD staff regarding pt's possible d/c today and orders for HD today as well. Contacted attending and nephrologist to inquire if pt for possible d/c today and if pt would be appropriate to d/c today with no HD treatment and start at [facility] tomorrow. Nephrologist felt pt appropriate to d/c today with no HD today and start at clinic tomorrow. Update provided to inpt HD unit staff. Contacted Jolayne Haines, Therapist, sports at [employer] at [employer]. Jolayne Haines advised of plan for pt to d/c today and start at clinic tomorrow. Contacted renal PA regarding clinic's need for orders for tomorrow. Appt for HD tomorrow updated on pt's AVS as well.   Melven Sartorius Renal Navigator [phone]  Addendum at 1:51 pm: Attempted to reach pt via phone to confirm pt aware to start at clinic tomorrow but was unable to reach pt. Pt's RN aware of plan and notified pt per RN note.

## 2022-01-24 NOTE — Progress Notes (Signed)
Discharged home  accompanied by brother, belonging taken home.

## 2022-01-24 NOTE — Inpatient Diabetes Management (Signed)
Inpatient Diabetes Program Recommendations  AACE/ADA: New Consensus Statement on Inpatient Glycemic Control (2015)  Target Ranges:  Prepandial:   less than 140 mg/dL      Peak postprandial:   less than 180 mg/dL (1-2 hours)      Critically ill patients:  140 - 180 mg/dL   Lab Results  Component Value Date   GLUCAP 261 (H) 01/24/2022   HGBA1C 7.2 (H) 09/07/2021    Latest Reference Range & Units 01/23/22 06:02 01/23/22 11:33 01/23/22 15:31 01/23/22 17:08 01/23/22 22:09 01/24/22 06:21  Glucose-Capillary 70 - 99 mg/dL 227 (H) 276 (H) 395 (H) 397 (H) 262 (H) 261 (H)  (H): Data is abnormally high  Diabetes history: DM2 Outpatient Diabetes medications: Humalog 1-5 units TID ac and hs (dose dependent on food intake), Lantus 6 units QHS  Current orders for Inpatient glycemic control: Novolog 0-20 units Q4H, on Solumedrol 40 mg QD   Inpatient Diabetes Program Recommendations:   Please consider: -Restart home basal insulin Semglee 6 units qd  Thank you, Nani Gasser. Brailyn Killion, RN, MSN, CDE  Diabetes Coordinator Inpatient Glycemic Control Team Team Pager [phone] (8am-5pm) 01/24/2022 10:46 AM

## 2022-01-24 NOTE — Discharge Summary (Signed)
Physician Discharge Summary   Patient: [name] MRN: [id number] DOB: 10-26-1940  Admit date:     01/17/2022  Discharge date: 01/24/22  Discharge Physician: [name]   PCP: Audley Hose, MD   Recommendations at discharge:   Follow-up PCP in 1 week Patient has hemodialysis scheduled as outpatient on 01/25/2022  Discharge Diagnoses: Principal Problem:   Acute on chronic diastolic CHF (congestive heart failure) (HCC) Active Problems:   DM (diabetes mellitus), type 2 with renal complications (HCC)   S/P TAVR (transcatheter aortic valve replacement)   Acute on chronic renal failure ([place])   Community acquired pneumonia   RSV (respiratory syncytial virus pneumonia)   Acute respiratory failure with hypoxia (HCC)   Malnutrition of moderate degree  Resolved Problems:   * No resolved hospital problems. *  Hospital Course:  Acute hypoxemic respiratory failure -In setting of RSV, CHF exacerbation, volume overload -Possible bacterial superinfection -Procalcitonin 11 -She was started on Solu-Medrol 40 g IV daily, will discontinue Solu-Medrol and start prednisone taper  will change DuoNeb nebulizers to Xopenex every 6 hours as needed  -Completed 5 days of treatment with ceftriaxone, Zithromax -Continue BiPAP as needed -Chest x-ray done yesterday showed worsening of left lung infiltrate concerning for pneumonia, however patient recently completed 5 days of treatment with antibiotics -Procalcitonin 3.1 -Patient has significantly improved, weaned off oxygen to room air -Pulse ox on ambulation check, patient not requiring oxygen -Will discharge home Acute on chronic diastolic heart failure -Initially started on Lasix with very minimal output -Nephrology consulted for hemodialysis for volume management -Restarted Imdur to 40 mg daily   Hypertensive urgency -Blood pressure was elevated after she came back from dialysis -She was given 1 dose of labetalol 10 mg IV with  improvement -Continue amlodipine, hydralazine -Started home medication of labetalol 200 mg p.o. twice daily -Also started on Imdur as above   CKD stage IV -Presented with volume overload worrisome for progression to ESRD -No improvement with IV Lasix -Nephrology consulted for hemodialysis -Underwent left upper extremity brachiocephalic AV fistula creation with Dr. Carlis Abbott in 2020, fistula seems to have matured well. -Patient getting dialysis as per nephrology -Outpatient hemodialysis has been set up -First hemodialysis on 01/25/2022 as outpatient   Chronic normocytic anemia -H&H has been stable -Transfuse for hemoglobin less than 7 -Was given IV iron per nephrology   Diabetes mellitus type 2 -CBG has been elevated due to steroids -Will taper down steroids as outpatient -Will increase dose of Tresiba to 10 units subcu daily -Continue Humalog at home dose per sliding scale      Assessment and Plan: No notes have been filed under this hospital service. Service: Hospitalist        Consultants: Nephrology Procedures performed: None Disposition: Home Diet recommendation:  Discharge Diet Orders (From admission, onward)     Start     Ordered   01/24/22 0000  Diet - low sodium heart healthy        01/24/22 1346           Carb modified diet DISCHARGE MEDICATION: Allergies as of 01/24/2022   No Known Allergies      Medication List     STOP taking these medications    insulin glargine 100 UNIT/ML injection Commonly known as: LANTUS Replaced by: Insulin Degludec 100 UNIT/ML Soln   Lokelma 10 g Pack packet Generic drug: sodium zirconium cyclosilicate   torsemide 20 MG tablet Commonly known as: DEMADEX       TAKE these  medications    Acetaminophen 500 MG capsule Take 500 mg by mouth every 6 (six) hours as needed for moderate pain or headache.   albuterol 108 (90 Base) MCG/ACT inhaler Commonly known as: VENTOLIN HFA Inhale 2 puffs into the lungs every 6  (six) hours as needed for wheezing or shortness of breath.   amLODipine 5 MG tablet Commonly known as: NORVASC Take 1 tablet (5 mg total) by mouth daily. Start taking on: January 25, 2022 What changed:  medication strength how much to take   aspirin EC 81 MG tablet Take 81 mg by mouth daily.   brimonidine 0.2 % ophthalmic solution Commonly known as: ALPHAGAN Place 1 drop into the left eye daily.   ferrous sulfate 325 (65 FE) MG tablet Take 325 mg by mouth daily with breakfast.   folic acid 1 MG tablet Commonly known as: FOLVITE Take 1 mg by mouth daily.   HumaLOG KwikPen 100 UNIT/ML KwikPen Generic drug: insulin lispro Inject 1-5 Units into the skin See admin instructions. Inject 1-5 units 3 times daily with meals and at bedtime. Dose depends on what pt is eating.   hydrALAZINE 100 MG tablet Commonly known as: APRESOLINE Take 100 mg by mouth 3 (three) times daily.   Insulin Degludec 100 UNIT/ML Soln Commonly known as: [place] Inject 10 Units into the skin at bedtime. Replaces: insulin glargine 100 UNIT/ML injection   isosorbide mononitrate 120 MG 24 hr tablet Commonly known as: IMDUR Take 2 tablets (240 mg total) by mouth daily.   labetalol 200 MG tablet Commonly known as: NORMODYNE TAKE 1 TABLET BY MOUTH TWICE A DAY   losartan 25 MG tablet Commonly known as: COZAAR Take 1 tablet (25 mg total) by mouth at bedtime.   mirtazapine 7.5 MG tablet Commonly known as: REMERON Take 7.5 mg by mouth at bedtime.   predniSONE 10 MG tablet Commonly known as: DELTASONE Prednisone 30 mg po daily x 1 day then Prednisone 20 mg po daily x 1 day then Prednisone 10 mg daily x 3 day then stop...   Repatha SureClick 696 MG/ML Soaj Generic drug: Evolocumab INJECT 1 ML INTO THE SKIN EVERY 14 (FOURTEEN) DAYS.   venlafaxine XR 150 MG 24 hr capsule Commonly known as: EFFEXOR-XR Take 1 capsule (150 mg total) by mouth daily.        [facility]. Go on 01/25/2022.   Why: Transitional Care Unit- Schedule will be Monday/Tuesday/Thursday/Friday with 12:50 chair time.  On Tuesday, please arrive at 12:15 to complete paperwork prior to treatment. Contact information: [address] [place] [address] [phone]                Discharge Exam: Danley Danker Weights   01/22/22 0335 01/23/22 0420 01/24/22 0533  Weight: 66.8 kg 66.1 kg 65.9 kg   General-appears in no acute distress Heart-S1-S2, regular, no murmur auscultated Lungs-clear to auscultation bilaterally, no wheezing or crackles auscultated Abdomen-soft, nontender, no organomegaly Extremities-no edema in the lower extremities Neuro-alert, oriented x3, no focal deficit noted  Condition at discharge: good  The results of significant diagnostics from this hospitalization (including imaging, microbiology, ancillary and laboratory) are listed below for reference.   Imaging Studies: DG Chest Port 1V same Day  Result Date: 01/22/2022 CLINICAL DATA:  Shortness of breath, pneumonia EXAM: PORTABLE CHEST 1 VIEW COMPARISON:  Previous studies including the examination done on 01/21/2022 FINDINGS: There is slight decrease in alveolar infiltrate in left upper lung field. There is slight  decrease in interstitial markings in the parahilar regions and lower lung fields. There is interval increase in opacification in right lower lung field. There is blunting of both lateral CP angles. There is no pneumothorax. IMPRESSION: There is interval decrease in infiltrate in left upper lung fields suggesting decrease in atelectasis/pneumonia. There is interval decrease in interstitial markings in both lungs suggesting resolving interstitial edema. There is interval increase in opacification in right lower lung fields suggesting increase in right pleural effusion and worsening of atelectasis/pneumonia. There is small left pleural effusion. Electronically Signed   By: Elmer Picker [name]   On:  01/22/2022 10:38   DG Chest Port 1 View  Result Date: 01/21/2022 CLINICAL DATA:  Respiratory distress EXAM: PORTABLE CHEST - 1 VIEW COMPARISON:  01/19/2022 FINDINGS: Unchanged cardiomegaly and pulmonary vascular congestion. Implanted cardiac device and postsurgical changes of TAVR are again seen. Interval improvement in aeration of the right lung base with moderate pleural effusion still remaining. Significant interval worsening of airspace opacity in the left mid lung suspicious for acute pneumonia. IMPRESSION: 1. Significant interval worsening of airspace opacity in the left mid lung suspicious for acute pneumonia. 2. Interval improvement in aeration of the right lung base with moderate pleural effusion still remaining. Electronically Signed   By: Miachel Roux [name]   On: 01/21/2022 14:19   DG CHEST PORT 1 VIEW  Result Date: 01/19/2022 CLINICAL DATA:  Acute respiratory failure EXAM: PORTABLE CHEST 1 VIEW COMPARISON:  01/17/2022 are FINDINGS: Elevated right hemidiaphragm. Increased left perihilar and infrahilar airspace opacity compared to previous. Difficult to exclude right perihilar airspace opacity given the severity of the right hemidiaphragmatic elevation. Prior TAVR. Loop recorder noted. Atherosclerotic calcification of the aortic arch. Stable mild interstitial accentuation in the lungs, cannot exclude interstitial edema. There is some mild obscuration of left hemidiaphragm compatible with mild left lower lobe airspace opacity, increased from previous. IMPRESSION: 1. Increased left perihilar and infrahilar airspace opacity compared to previous, potentially from pneumonia or aspiration pneumonitis. 2. Elevated right hemidiaphragm. 3. Mild obscuration of the left hemidiaphragm compatible with mild left lower lobe airspace opacity, increased from previous. 4. Stable mild interstitial accentuation in the lungs, cannot exclude interstitial edema. 5. Loop recorder noted. Electronically Signed   By: Van Clines [name]   On: 01/19/2022 08:14   ECHOCARDIOGRAM COMPLETE  Result Date: 01/18/2022    ECHOCARDIOGRAM REPORT   Patient Name:   [name] Date of Exam: 01/18/2022 Medical Rec #:  [id number]     Height:       62.5 in Accession #:    [id number]    Weight:       163.4 lb Date of Birth:  1940/05/26     BSA:          1.765 m Patient Age:    52 years      BP:           155/67 mmHg Patient Gender: F             HR:           79 bpm. Exam Location:  Inpatient Procedure: 2D Echo, 3D Echo, Color Doppler, Cardiac Doppler and Strain Analysis Indications:    CHF-Acute Diastolic C58.52  History:        Patient has prior history of Echocardiogram examinations, most                 recent 05/12/2020. CHF, CAD, Aortic Valve Disease,  Signs/Symptoms:Edema and Dyspnea; Risk Factors:Diabetes,                 Non-Smoker, Hypertension and Dyslipidemia.                 Aortic Valve: 23 mm Edwards Sapien prosthetic, stented (TAVR)                 valve is present in the aortic position. Procedure Date:                 07/29/20.  Sonographer:    Greer Pickerel Referring Phys: [id number] JESSICA Alison Stalling  Sonographer Comments: Global longitudinal strain was attempted. IMPRESSIONS  1. Left ventricular ejection fraction, by estimation, is 50 to 55%. Left ventricular ejection fraction by 3D volume is 53 %. The left ventricle has low normal function. The left ventricle demonstrates regional wall motion abnormalities (see scoring diagram/findings for description). There is mild concentric left ventricular hypertrophy. Left ventricular diastolic parameters are consistent with Grade II diastolic dysfunction (pseudonormalization). Elevated left atrial pressure. There is mild hypokinesis of the left ventricular, basal-mid inferoseptal wall and inferior wall. The average left ventricular global longitudinal strain is -13.8 %. The global longitudinal strain is abnormal.  2. Right ventricular systolic function is mildly reduced. The right  ventricular size is normal. There is mildly elevated pulmonary artery systolic pressure. The estimated right ventricular systolic pressure is 94.8 mmHg.  3. Left atrial size was moderately dilated.  4. Right atrial size was mildly dilated.  5. The mitral valve is grossly normal. Mild mitral valve regurgitation. No evidence of mitral stenosis. The mean mitral valve gradient is 3.7 mmHg. Moderate mitral annular calcification.  6. The aortic valve has been repaired/replaced. Aortic valve regurgitation is not visualized. There is a 23 mm Edwards Sapien prosthetic (TAVR) valve present in the aortic position. Procedure Date: 07/29/20. Echo findings are consistent with normal structure and function of the aortic valve prosthesis. Aortic valve mean gradient measures 12.0 mmHg. Aortic valve Vmax measures 2.28 m/s. Aortic valve acceleration time measures 85 msec.  7. The inferior vena cava is dilated in size with <50% respiratory variability, suggesting right atrial pressure of 15 mmHg. Comparison(s): Prior images reviewed side by side. The left ventricular function is worsened. The left ventricular wall motion abnormality is new. FINDINGS  Left Ventricle: Left ventricular ejection fraction, by estimation, is 50 to 55%. Left ventricular ejection fraction by 3D volume is 53 %. The left ventricle has low normal function. The left ventricle demonstrates regional wall motion abnormalities. Mild hypokinesis of the left ventricular, basal-mid inferoseptal wall and inferior wall. The average left ventricular global longitudinal strain is -13.8 %. The global longitudinal strain is abnormal. The left ventricular internal cavity size was normal in size. There is mild concentric left ventricular hypertrophy. Left ventricular diastolic parameters are consistent with Grade II diastolic dysfunction (pseudonormalization). Elevated left atrial pressure. Right Ventricle: The right ventricular size is normal. No increase in right ventricular  wall thickness. Right ventricular systolic function is mildly reduced. There is mildly elevated pulmonary artery systolic pressure. The tricuspid regurgitant velocity  is 2.20 m/s, and with an assumed right atrial pressure of 15 mmHg, the estimated right ventricular systolic pressure is 54.6 mmHg. Left Atrium: Left atrial size was moderately dilated. Right Atrium: Right atrial size was mildly dilated. Pericardium: There is no evidence of pericardial effusion. Mitral Valve: The mitral valve is grossly normal. There is moderate thickening of the mitral valve leaflet(s). Moderate mitral annular calcification. Mild mitral  valve regurgitation. No evidence of mitral valve stenosis. The mean mitral valve gradient is  3.7 mmHg with average heart rate of 77 bpm. Tricuspid Valve: The tricuspid valve is normal in structure. Tricuspid valve regurgitation is mild. Aortic Valve: The aortic valve has been repaired/replaced. Aortic valve regurgitation is not visualized. Aortic valve mean gradient measures 12.0 mmHg. Aortic valve peak gradient measures 20.9 mmHg. Aortic valve area, by VTI measures 1.86 cm. There is a  23 mm Edwards Sapien prosthetic, stented (TAVR) valve present in the aortic position. Procedure Date: 07/29/20. Echo findings are consistent with normal structure and function of the aortic valve prosthesis. Pulmonic Valve: The pulmonic valve was normal in structure. Pulmonic valve regurgitation is not visualized. Aorta: The aortic root and ascending aorta are structurally normal, with no evidence of dilitation. Venous: The inferior vena cava is dilated in size with less than 50% respiratory variability, suggesting right atrial pressure of 15 mmHg. IAS/Shunts: No atrial level shunt detected by color flow Doppler.  LEFT VENTRICLE PLAX 2D LVIDd:         4.60 cm         Diastology LVIDs:         3.40 cm         LV e' medial:    3.05 cm/s LV PW:         1.30 cm         LV E/e' medial:  40.3 LV IVS:        1.20 cm          LV e' lateral:   6.31 cm/s LVOT diam:     2.30 cm         LV E/e' lateral: 19.5 LV SV:         99 LV SV Index:   56              2D LVOT Area:     4.15 cm        Longitudinal                                Strain                                2D Strain GLS  -13.8 %                                Avg:                                 3D Volume EF                                LV 3D EF:    Left                                             ventricul  ar                                             ejection                                             fraction                                             by 3D                                             volume is                                             53 %.                                 3D Volume EF:                                3D EF:        53 %                                LV EDV:       165 ml                                LV ESV:       78 ml                                LV SV:        87 ml RIGHT VENTRICLE RV S prime:     9.14 cm/s TAPSE (M-mode): 1.6 cm LEFT ATRIUM              Index        RIGHT ATRIUM           Index LA diam:        4.60 cm  2.61 cm/m   RA Area:     24.90 cm LA Vol (A2C):   95.3 ml  54.01 ml/m  RA Volume:   82.40 ml  46.70 ml/m LA Vol (A4C):   117.0 ml 66.30 ml/m LA Biplane Vol: 106.0 ml 60.07 ml/m  AORTIC VALVE                     PULMONIC VALVE AV Area (Vmax):    1.98 cm      PR End Diast Vel: 3.91 msec AV Area (Vmean):   1.94 cm AV Area (VTI):     1.86  cm AV Vmax:           228.33 cm/s AV Vmean:          163.667 cm/s AV VTI:            0.533 m AV Peak Grad:      20.9 mmHg AV Mean Grad:      12.0 mmHg LVOT Vmax:         109.00 cm/s LVOT Vmean:        76.400 cm/s LVOT VTI:          0.239 m LVOT/AV VTI ratio: 0.45  AORTA Ao Root diam: 2.80 cm Ao Asc diam:  3.50 cm MITRAL VALVE                TRICUSPID VALVE MV Area (PHT): 4.11 cm     TR Peak grad:   19.4 mmHg MV Mean grad:  3.7 mmHg      TR Vmax:        220.00 cm/s MV Decel Time: 185 msec MR Peak grad: 94.5 mmHg     SHUNTS MR Vmax:      486.00 cm/s   Systemic VTI:  0.24 m MV E velocity: 123.00 cm/s  Systemic Diam: 2.30 cm MV A velocity: 120.00 cm/s MV E/A ratio:  1.03 Mihai Croitoru MD Electronically signed by Sanda Klein MD Signature Date/Time: 01/18/2022/10:25:32 AM    Final    DG Chest Portable 1 View  Result Date: 01/17/2022 CLINICAL DATA:  Shortness of breath EXAM: PORTABLE CHEST 1 VIEW COMPARISON:  12/20/2021 FINDINGS: Cardiac shadow is enlarged. TAVR is again identified. Loop recorder is seen. Slight increase in right-sided pleural effusion is noted. Persistent right basilar airspace opacity is noted with slight increase when compared with the prior exam. Patchy airspace opacity is noted in the left mid lung new from the prior exam. No bony abnormality is noted. IMPRESSION: Increasing airspace consolidation in the right base with associated enlarging effusion. New left perihilar infiltrate Electronically Signed   By: Inez Catalina [name]   On: 01/17/2022 01:35    Microbiology: Results for orders placed or performed during the hospital encounter of 01/17/22  Blood culture (routine x 2)     Status: None   Collection Time: 01/17/22  1:20 AM   Specimen: BLOOD RIGHT FOREARM  Result Value Ref Range Status   Specimen Description BLOOD RIGHT FOREARM  Final   Special Requests BOTTLES DRAWN AEROBIC AND ANAEROBIC  Final   Culture   Final    NO GROWTH 6 DAYS Performed at [facility], [place] [address], [place], [place] [address]    Report Status 01/23/2022 FINAL  Final  Resp panel by RT-PCR (RSV, Flu A&B, Covid) Anterior Nasal Swab     Status: Abnormal   Collection Time: 01/17/22  1:21 AM   Specimen: Anterior Nasal Swab  Result Value Ref Range Status   SARS Coronavirus 2 by RT PCR NEGATIVE NEGATIVE Final    Comment: (NOTE) SARS-CoV-2 target nucleic acids are NOT DETECTED.  The SARS-CoV-2 RNA is generally detectable in upper  respiratory specimens during the acute phase of infection. The lowest concentration of SARS-CoV-2 viral copies this assay can detect is 138 copies/mL. A negative result does not preclude SARS-Cov-2 infection and should not be used as the sole basis for treatment or other patient management decisions. A negative result may occur with  improper specimen collection/handling, submission of specimen other than nasopharyngeal swab, presence of viral mutation(s) within the areas targeted by  this assay, and inadequate number of viral copies(<138 copies/mL). A negative result must be combined with clinical observations, patient history, and epidemiological information. The expected result is Negative.  Fact Sheet for Patients:  EntrepreneurPulse.com.au  Fact Sheet for Healthcare Providers:  IncredibleEmployment.be  This test is no t yet approved or cleared by the [place] FDA and  has been authorized for detection and/or diagnosis of SARS-CoV-2 by FDA under an Emergency Use Authorization (EUA). This EUA will remain  in effect (meaning this test can be used) for the duration of the COVID-19 declaration under Section 564(b)(1) of the Act, 21 U.S.C.section 360bbb-3(b)(1), unless the authorization is terminated  or revoked sooner.       Influenza A by PCR NEGATIVE NEGATIVE Final   Influenza B by PCR NEGATIVE NEGATIVE Final    Comment: (NOTE) The Xpert Xpress SARS-CoV-2/FLU/RSV plus assay is intended as an aid in the diagnosis of influenza from Nasopharyngeal swab specimens and should not be used as a sole basis for treatment. Nasal washings and aspirates are unacceptable for Xpert Xpress SARS-CoV-2/FLU/RSV testing.  Fact Sheet for Patients: EntrepreneurPulse.com.au  Fact Sheet for Healthcare Providers: IncredibleEmployment.be  This test is not yet approved or cleared by the [place] FDA and has been  authorized for detection and/or diagnosis of SARS-CoV-2 by FDA under an Emergency Use Authorization (EUA). This EUA will remain in effect (meaning this test can be used) for the duration of the COVID-19 declaration under Section 564(b)(1) of the Act, 21 U.S.C. section 360bbb-3(b)(1), unless the authorization is terminated or revoked.     Resp Syncytial Virus by PCR POSITIVE (A) NEGATIVE Final    Comment: (NOTE) Fact Sheet for Patients: EntrepreneurPulse.com.au  Fact Sheet for Healthcare Providers: IncredibleEmployment.be  This test is not yet approved or cleared by the [place] FDA and has been authorized for detection and/or diagnosis of SARS-CoV-2 by FDA under an Emergency Use Authorization (EUA). This EUA will remain in effect (meaning this test can be used) for the duration of the COVID-19 declaration under Section 564(b)(1) of the Act, 21 U.S.C. section 360bbb-3(b)(1), unless the authorization is terminated or revoked.  Performed at [facility], [address], [place], [place] [address]   Blood culture (routine x 2)     Status: None   Collection Time: 01/17/22  1:28 AM   Specimen: BLOOD RIGHT HAND  Result Value Ref Range Status   Specimen Description BLOOD RIGHT HAND  Final   Special Requests BOTTLES DRAWN AEROBIC AND ANAEROBIC  Final   Culture   Final    NO GROWTH 6 DAYS Performed at [facility], [place] [address] [place], [place] [address]    Report Status 01/23/2022 FINAL  Final  MRSA Next Gen by PCR, Nasal     Status: None   Collection Time: 01/18/22  3:15 PM   Specimen: Nasal Mucosa; Nasal Swab  Result Value Ref Range Status   MRSA by PCR Next Gen NOT DETECTED NOT DETECTED Final    Comment: (NOTE) The GeneXpert MRSA Assay (FDA approved for NASAL specimens only), is one component of a comprehensive MRSA colonization surveillance program. It is not intended to diagnose MRSA infection nor to  guide or monitor treatment for MRSA infections. Test performance is not FDA approved in patients less than 48 years old. Performed at [facility], [place] [address] [place], [place] [address]     Labs: CBC: Recent Labs  Lab 01/18/22 0333 01/19/22 0022  WBC 11.4* 12.6*  HGB  9.7* 9.0*  HCT 30.0* 27.8*  MCV 84.5 84.2  PLT 297 915   Basic Metabolic Panel: Recent Labs  Lab 01/18/22 0333 01/19/22 0022 01/21/22 0059  NA 137 135 138  K 3.4* 3.4* 3.8  CL 102 99 96*  CO2 19* 21* 24  GLUCOSE 153* 184* 232*  BUN 66* 58* 56*  CREATININE 3.92* 3.16* 3.14*  CALCIUM 8.8* 8.4* 8.6*  MG 2.0 1.8  --   PHOS  --  4.2  --    Liver Function Tests: Recent Labs  Lab 01/18/22 0333  AST 22  ALT 12  ALKPHOS 71  BILITOT 0.5  PROT 6.2*  ALBUMIN 2.5*   CBG: Recent Labs  Lab 01/23/22 1531 01/23/22 1708 01/23/22 2209 01/24/22 0621 01/24/22 1102  GLUCAP 395* 397* 262* 261* 334*    Discharge time spent: greater than 30 minutes.  Signed: [name], MD [facility] 01/24/2022

## 2022-01-24 NOTE — Procedures (Signed)
HD note  Patient received her hydralazine this am.  Rapid Response was called during her last dialysis treatment.  This will be her 4th treatment.  She will be rescheduled to second shift related to BP meds given this am.

## 2022-01-24 NOTE — Progress Notes (Signed)
[facility]  PROGRESS NOTE  [name] ZOX:[id number] DOB: 1940/04/21 DOA: 01/17/2022 PCP: Audley Hose, MD   Brief HPI:   82 year old female with medical history of HFpEF, aortic stenosis s/p TAVR 2020, diabetes mellitus type 2, hypertension, CKD stage IV, GERD, CVA, neuropathy, hyperlipidemia admitted with acute hypoxic respiratory failure in setting of RSV and heart failure.  Failed diuresis, nephrology consulted, started on hemodialysis.    Subjective   Patient seen and examined, she has been weaned off oxygen.  Denies shortness of breath.   Assessment/Plan:    Acute hypoxemic respiratory failure -In setting of RSV, CHF exacerbation, volume overload -Possible bacterial superinfection -Procalcitonin 11 -She was started on Solu-Medrol 40 g IV daily, will discontinue Solu-Medrol and start prednisone taper  will change DuoNeb nebulizers to Xopenex every 6 hours as needed  -Completed 5 days of treatment with ceftriaxone, Zithromax -Continue BiPAP as needed -Chest x-ray done yesterday showed worsening of left lung infiltrate concerning for pneumonia, however patient recently completed 5 days of treatment with antibiotics -Procalcitonin 3.1 -Patient has significantly improved, weaned off oxygen to room air -Pulse ox on ambulation check, patient not requiring oxygen -Will discharge home  Acute on chronic diastolic heart failure -Initially started on Lasix with very minimal output -Nephrology consulted for hemodialysis for volume management -Restarted Imdur to 40 mg daily  Hypertensive urgency -Blood pressure was elevated after she came back from dialysis -She was given 1 dose of labetalol 10 mg IV with improvement -Continue amlodipine, hydralazine -Started home medication of labetalol 200 mg p.o. twice daily -Also started on Imdur as above  CKD stage IV -Presented with volume overload worrisome for progression to ESRD -No improvement with IV Lasix -Nephrology  consulted for hemodialysis -Underwent left upper extremity brachiocephalic AV fistula creation with Dr. Carlis Abbott in 2020, fistula seems to have matured well. -Patient getting dialysis as per nephrology -social work is working to arrange outpatient HD  Chronic normocytic anemia -H&H has been stable -Transfuse for hemoglobin less than 7 -IV iron per nephrology  Diabetes mellitus type 2 -CBG well-controlled -Continue sliding scale insulin NovoLog     Medications     amLODipine  5 mg Oral Daily   Chlorhexidine Gluconate Cloth  6 each Topical Q0600   darbepoetin (ARANESP) injection - DIALYSIS  40 mcg Subcutaneous Q Sun-1800   feeding supplement (GLUCERNA SHAKE)  237 mL Oral BID BM   heparin injection (subcutaneous)  5,000 Units Subcutaneous Q8H   hydrALAZINE  100 mg Oral Q8H   insulin aspart  0-20 Units Subcutaneous TID WC   insulin aspart  0-5 Units Subcutaneous QHS   isosorbide mononitrate  240 mg Oral Daily   labetalol  200 mg Oral BID   losartan  25 mg Oral QHS   multivitamin  1 tablet Oral QHS   polyethylene glycol  17 g Oral Daily   predniSONE  40 mg Oral Q breakfast   senna-docusate  1 tablet Oral BID   venlafaxine XR  75 mg Oral Daily     Data Reviewed:   CBG:  Recent Labs  Lab 01/23/22 1133 01/23/22 1531 01/23/22 1708 01/23/22 2209 01/24/22 0621  GLUCAP 276* 395* 397* 262* 261*    SpO2: 96 % O2 Flow Rate (L/min): 2 L/min FiO2 (%): 40 %    Vitals:   01/23/22 2332 01/24/22 0412 01/24/22 0533 01/24/22 0752  BP: (!) 123/49 (!) 151/58  (!) 141/48  Pulse: 61 67  72  Resp: 16 15  18   Temp: 98  F (36.7 C) 97.9 F (36.6 C)  97.6 F (36.4 C)  TempSrc: Oral Oral  Oral  SpO2: 96% 97%  96%  Weight:   65.9 kg   Height:          Data Reviewed:  Basic Metabolic Panel: Recent Labs  Lab 01/18/22 0333 01/19/22 0022 01/21/22 0059  NA 137 135 138  K 3.4* 3.4* 3.8  CL 102 99 96*  CO2 19* 21* 24  GLUCOSE 153* 184* 232*  BUN 66* 58* 56*  CREATININE  3.92* 3.16* 3.14*  CALCIUM 8.8* 8.4* 8.6*  MG 2.0 1.8  --   PHOS  --  4.2  --     CBC: Recent Labs  Lab 01/18/22 0333 01/19/22 0022  WBC 11.4* 12.6*  HGB 9.7* 9.0*  HCT 30.0* 27.8*  MCV 84.5 84.2  PLT 297 283    LFT Recent Labs  Lab 01/18/22 0333  AST 22  ALT 12  ALKPHOS 71  BILITOT 0.5  PROT 6.2*  ALBUMIN 2.5*     Antibiotics: Anti-infectives (From admission, onward)    Start     Dose/Rate Route Frequency Ordered Stop   01/21/22 1500  cefTRIAXone (ROCEPHIN) 2 g in sodium chloride 0.9 % 100 mL IVPB       Note to Pharmacy: Give to patient once she arrives back on unit from HD   2 g 200 mL/hr over 30 Minutes Intravenous  Once 01/21/22 0800 01/21/22 1723   01/21/22 1400  azithromycin (ZITHROMAX) 500 mg in sodium chloride 0.9 % 250 mL IVPB        500 mg 250 mL/hr over 60 Minutes Intravenous  Once 01/21/22 0800 01/21/22 1559   01/18/22 0800  azithromycin (ZITHROMAX) 500 mg in sodium chloride 0.9 % 250 mL IVPB  Status:  Discontinued        500 mg 250 mL/hr over 60 Minutes Intravenous Every 24 hours 01/17/22 1805 01/21/22 0801   01/18/22 0754  cefTRIAXone (ROCEPHIN) 2 g in sodium chloride 0.9 % 100 mL IVPB  Status:  Discontinued        2 g 200 mL/hr over 30 Minutes Intravenous Every 24 hours 01/18/22 0754 01/21/22 0801   01/18/22 0700  cefTRIAXone (ROCEPHIN) 1 g in sodium chloride 0.9 % 100 mL IVPB  Status:  Discontinued        1 g 200 mL/hr over 30 Minutes Intravenous Every 24 hours 01/17/22 1805 01/18/22 0754   01/17/22 0645  cefTRIAXone (ROCEPHIN) 1 g in sodium chloride 0.9 % 100 mL IVPB        1 g 200 mL/hr over 30 Minutes Intravenous  Once 01/17/22 0644 01/17/22 0833   01/17/22 0645  azithromycin (ZITHROMAX) 500 mg in sodium chloride 0.9 % 250 mL IVPB        500 mg 250 mL/hr over 60 Minutes Intravenous  Once 01/17/22 0644 01/17/22 0940        DVT prophylaxis: Heparin  Code Status: Full code  Family Communication: No family at bedside   CONSULTS  nephrology, PCCM   Objective    Physical Examination:   General-appears in no acute distress Heart-S1-S2, regular, no murmur auscultated Lungs-clear to auscultation bilaterally, no wheezing or crackles auscultated Abdomen-soft, nontender, no organomegaly Extremities-no edema in the lower extremities    Status is: Inpatient:             [name]   [facility] If 7PM-7AM, please contact night-coverage at [email or web address], Office  [phone]   01/24/2022, 9:49 AM  LOS: 7 days

## 2022-02-21 ENCOUNTER — Emergency Department (HOSPITAL_COMMUNITY): Payer: Medicare PPO

## 2022-02-21 ENCOUNTER — Encounter (HOSPITAL_COMMUNITY): Payer: Self-pay

## 2022-02-21 ENCOUNTER — Other Ambulatory Visit: Payer: Self-pay

## 2022-02-21 ENCOUNTER — Emergency Department (HOSPITAL_COMMUNITY)
Admission: EM | Admit: 2022-02-21 | Discharge: 2022-02-21 | Disposition: A | Discharge status: Home / Self Care | Payer: Medicare PPO | Attending: Emergency Medicine | Admitting: Emergency Medicine

## 2022-02-21 DIAGNOSIS — R202 Paresthesia of skin: Secondary | ICD-10-CM | POA: Insufficient documentation

## 2022-02-21 DIAGNOSIS — H538 Other visual disturbances: Secondary | ICD-10-CM | POA: Insufficient documentation

## 2022-02-21 DIAGNOSIS — Z79899 Other long term (current) drug therapy: Secondary | ICD-10-CM | POA: Insufficient documentation

## 2022-02-21 DIAGNOSIS — R110 Nausea: Secondary | ICD-10-CM | POA: Diagnosis not present

## 2022-02-21 DIAGNOSIS — R42 Dizziness and giddiness: Secondary | ICD-10-CM | POA: Insufficient documentation

## 2022-02-21 DIAGNOSIS — Z7982 Long term (current) use of aspirin: Secondary | ICD-10-CM | POA: Diagnosis not present

## 2022-02-21 DIAGNOSIS — Z794 Long term (current) use of insulin: Secondary | ICD-10-CM | POA: Diagnosis not present

## 2022-02-21 DIAGNOSIS — R531 Weakness: Secondary | ICD-10-CM | POA: Diagnosis not present

## 2022-02-21 DIAGNOSIS — R519 Headache, unspecified: Secondary | ICD-10-CM | POA: Insufficient documentation

## 2022-02-21 DIAGNOSIS — I639 Cerebral infarction, unspecified: Secondary | ICD-10-CM | POA: Insufficient documentation

## 2022-02-21 LAB — URINALYSIS, ROUTINE W REFLEX MICROSCOPIC
Bilirubin Urine: NEGATIVE
Glucose, UA: >=500 mg/dL — AB
Hgb urine dipstick: NEGATIVE
Ketones, ur: 5 mg/dL — AB
Nitrite: NEGATIVE
Protein, ur: >=300 mg/dL — AB
Specific Gravity, Urine: 1.009 (ref 1.005–1.030)
pH: 8 (ref 5.0–8.0)

## 2022-02-21 LAB — CBC
HCT: 42.8 % (ref 36.0–46.0)
Hemoglobin: 14.1 g/dL (ref 12.0–15.0)
MCH: 29.9 pg (ref 26.0–34.0)
MCHC: 32.9 g/dL (ref 30.0–36.0)
MCV: 90.7 fL (ref 80.0–100.0)
Platelets: 212 10*3/uL (ref 150–400)
RBC: 4.72 MIL/uL (ref 3.87–5.11)
RDW: 18.4 % — ABNORMAL HIGH (ref 11.5–15.5)
WBC: 5.7 10*3/uL (ref 4.0–10.5)
nRBC: 0 % (ref 0.0–0.2)

## 2022-02-21 LAB — I-STAT CHEM 8, ED
BUN: 30 mg/dL — ABNORMAL HIGH (ref 8–23)
Calcium, Ion: 1.1 mmol/L — ABNORMAL LOW (ref 1.15–1.40)
Chloride: 98 mmol/L (ref 98–111)
Creatinine, Ser: 2.6 mg/dL — ABNORMAL HIGH (ref 0.44–1.00)
Glucose, Bld: 318 mg/dL — ABNORMAL HIGH (ref 70–99)
HCT: 46 % (ref 36.0–46.0)
Hemoglobin: 15.6 g/dL — ABNORMAL HIGH (ref 12.0–15.0)
Potassium: 4 mmol/L (ref 3.5–5.1)
Sodium: 138 mmol/L (ref 135–145)
TCO2: 26 mmol/L (ref 22–32)

## 2022-02-21 LAB — COMPREHENSIVE METABOLIC PANEL
ALT: 14 U/L (ref 0–44)
AST: 35 U/L (ref 15–41)
Albumin: 2.7 g/dL — ABNORMAL LOW (ref 3.5–5.0)
Alkaline Phosphatase: 97 U/L (ref 38–126)
Anion gap: 13 (ref 5–15)
BUN: 29 mg/dL — ABNORMAL HIGH (ref 8–23)
CO2: 26 mmol/L (ref 22–32)
Calcium: 8.9 mg/dL (ref 8.9–10.3)
Chloride: 97 mmol/L — ABNORMAL LOW (ref 98–111)
Creatinine, Ser: 2.54 mg/dL — ABNORMAL HIGH (ref 0.44–1.00)
GFR, Estimated: 18 mL/min — ABNORMAL LOW (ref >60)
Glucose, Bld: 327 mg/dL — ABNORMAL HIGH (ref 70–99)
Potassium: 4.1 mmol/L (ref 3.5–5.1)
Sodium: 136 mmol/L (ref 135–145)
Total Bilirubin: 0.5 mg/dL (ref 0.3–1.2)
Total Protein: 5.8 g/dL — ABNORMAL LOW (ref 6.5–8.1)

## 2022-02-21 LAB — DIFFERENTIAL
Abs Immature Granulocytes: 0.01 10*3/uL (ref 0.00–0.07)
Basophils Absolute: 0 10*3/uL (ref 0.0–0.1)
Basophils Relative: 1 %
Eosinophils Absolute: 0.1 10*3/uL (ref 0.0–0.5)
Eosinophils Relative: 1 %
Immature Granulocytes: 0 %
Lymphocytes Relative: 29 %
Lymphs Abs: 1.6 10*3/uL (ref 0.7–4.0)
Monocytes Absolute: 0.5 10*3/uL (ref 0.1–1.0)
Monocytes Relative: 9 %
Neutro Abs: 3.4 10*3/uL (ref 1.7–7.7)
Neutrophils Relative %: 60 %

## 2022-02-21 LAB — RAPID URINE DRUG SCREEN, HOSP PERFORMED
Amphetamines: NOT DETECTED
Barbiturates: NOT DETECTED
Benzodiazepines: NOT DETECTED
Cocaine: NOT DETECTED
Opiates: NOT DETECTED
Tetrahydrocannabinol: NOT DETECTED

## 2022-02-21 LAB — PROTIME-INR
INR: 1 (ref 0.8–1.2)
Prothrombin Time: 12.8 seconds (ref 11.4–15.2)

## 2022-02-21 LAB — APTT: aPTT: 42 seconds — ABNORMAL HIGH (ref 24–36)

## 2022-02-21 MED ORDER — ACETAMINOPHEN 500 MG PO TABS
1000.0000 mg | ORAL_TABLET | Freq: Once | ORAL | Status: AC
  Administered 2022-02-21: 1000 mg via ORAL
  Filled 2022-02-21: qty 2

## 2022-02-21 MED ORDER — LORAZEPAM 2 MG/ML IJ SOLN
1.0000 mg | Freq: Once | INTRAMUSCULAR | Status: AC
  Administered 2022-02-21: 1 mg via INTRAVENOUS
  Filled 2022-02-21: qty 1

## 2022-02-21 NOTE — ED Provider Notes (Signed)
8:15 PM Care of the patient assumed at signout.  Patient is awake, alert, sitting upright, eating crackers.  She is accompanied by her husband.  We discussed MRI and CT results.  No evidence for acute new CVA no ongoing complaints.  Patient comfortable with, appropriate for discharge.   Carmin Muskrat, MD 02/21/22 Thom Chimes

## 2022-02-21 NOTE — ED Provider Notes (Signed)
[facility] Provider Note   CSN: [id number] Arrival date & time: 02/21/22  1218     History  Chief Complaint  Patient presents with   Dizziness   Headache    [name] is a 82 y.o. female.  Patient is a 82 year old female who presents with dizziness and nausea.  She states that she woke up this morning and said she did not feel very well.  She was having some dizziness which she describes more as a lightheadedness and some nausea.  She did go onto dialysis and developed a headache while she was at dialysis.  She continued to have the dizziness and nausea.  She completed about 1 hour of dialysis prior to transport here.  She also says she has had some left-sided vision changes.  She is followed by an ophthalmologist and says that her left vision is not normal although it had recently been improving.  She said that over the last 2 days she has had some gradual worsening of the vision.  It has blurry and she has some loss of her peripheral vision.  She has had prior strokes.  She has some chronic left-sided weakness and numbness at baseline but she says today it feels a little bit worse than her baseline.       Home Medications Prior to Admission medications   Medication Sig Start Date End Date Taking? Authorizing Provider  Acetaminophen 500 MG capsule Take 500 mg by mouth every 6 (six) hours as needed for moderate pain or headache.    [provider]  albuterol (VENTOLIN HFA) 108 (90 Base) MCG/ACT inhaler Inhale 2 puffs into the lungs every 6 (six) hours as needed for wheezing or shortness of breath. 01/24/22   [name], MD  amLODipine (NORVASC) 5 MG tablet Take 1 tablet (5 mg total) by mouth daily. 01/25/22   [name], MD  aspirin EC 81 MG tablet Take 81 mg by mouth daily.    [provider]  brimonidine (ALPHAGAN) 0.2 % ophthalmic solution Place 1 drop into the left eye daily. 04/19/19   [provider]   ferrous sulfate 325 (65 FE) MG tablet Take 325 mg by mouth daily with breakfast.    [provider]  folic acid (FOLVITE) 1 MG tablet Take 1 mg by mouth daily.    [provider]  HUMALOG KWIKPEN 100 UNIT/ML KwikPen Inject 1-5 Units into the skin See admin instructions. Inject 1-5 units 3 times daily with meals and at bedtime. Dose depends on what pt is eating. 05/28/18   [provider]  hydrALAZINE (APRESOLINE) 100 MG tablet Take 100 mg by mouth 3 (three) times daily. 08/09/21   [provider]  Insulin Degludec (TRESIBA) 100 UNIT/ML SOLN Inject 10 Units into the skin at bedtime. 01/24/22   [name], MD  isosorbide mononitrate (IMDUR) 120 MG 24 hr tablet Take 2 tablets (240 mg total) by mouth daily. 08/23/21 02/19/22  Patwardhan, Reynold Bowen, MD  labetalol (NORMODYNE) 200 MG tablet TAKE 1 TABLET BY MOUTH TWICE A DAY 11/02/21   Patwardhan, Manish J, MD  losartan (COZAAR) 25 MG tablet Take 1 tablet (25 mg total) by mouth at bedtime. 01/24/22   [name], MD  mirtazapine (REMERON) 7.5 MG tablet Take 7.5 mg by mouth at bedtime. 10/12/21   [provider]  predniSONE (DELTASONE) 10 MG tablet Prednisone 30 mg po daily x 1 day then Prednisone 20 mg  po daily x 1 day then Prednisone 10 mg daily x 3 day then stop... 01/24/22   [name], MD  REPATHA SURECLICK 742 MG/ML SOAJ INJECT 1 ML INTO THE SKIN EVERY 14 (FOURTEEN) DAYS. 02/05/21   Patwardhan, Reynold Bowen, MD  venlafaxine XR (EFFEXOR-XR) 150 MG 24 hr capsule Take 1 capsule (150 mg total) by mouth daily. 02/27/20   Bary Leriche, PA-C      Allergies    Patient has no known allergies.    Review of Systems   Review of Systems  Constitutional:  Positive for fatigue. Negative for chills, diaphoresis and fever.  HENT:  Negative for congestion, rhinorrhea and sneezing.   Eyes: Negative.   Respiratory:  Negative for cough, chest tightness and shortness of breath.   Cardiovascular:  Negative for chest pain and leg  swelling.  Gastrointestinal:  Positive for nausea. Negative for abdominal pain, blood in stool, diarrhea and vomiting.  Genitourinary:  Negative for difficulty urinating, flank pain, frequency and hematuria.  Musculoskeletal:  Negative for arthralgias and back pain.  Skin:  Negative for rash.  Neurological:  Positive for dizziness, light-headedness and headaches. Negative for speech difficulty, weakness and numbness.    Physical Exam Updated Vital Signs BP (!) 179/75   Pulse 76   Temp 98.5 F (36.9 C) (Oral)   Resp 15   Ht 5\' 2"  (1.575 m)   Wt 65.8 kg   SpO2 100%   BMI 26.52 kg/m  Physical Exam Constitutional:      Appearance: She is well-developed.  HENT:     Head: Normocephalic and atraumatic.  Eyes:     Extraocular Movements: Extraocular movements intact.     Pupils: Pupils are equal, round, and reactive to light.     Comments: She has some loss of vision in her left lateral peripheral fields and a little bit in her right medial lower visual field on dual field testing.  Cardiovascular:     Rate and Rhythm: Normal rate and regular rhythm.     Heart sounds: Normal heart sounds.  Pulmonary:     Effort: Pulmonary effort is normal. No respiratory distress.     Breath sounds: Normal breath sounds. No wheezing or rales.  Chest:     Chest wall: No tenderness.  Abdominal:     General: Bowel sounds are normal.     Palpations: Abdomen is soft.     Tenderness: There is no abdominal tenderness. There is no guarding or rebound.  Musculoskeletal:        General: Normal range of motion.     Cervical back: Normal range of motion and neck supple.  Lymphadenopathy:     Cervical: No cervical adenopathy.  Skin:    General: Skin is warm and dry.     Findings: No rash.  Neurological:     Mental Status: She is alert and oriented to person, place, and time.     Comments: 4 out of 5 strength in the left upper extremity, 5 out of 5 strength in the right upper and lower extremity, 3 out of  5 strength in the right lower extremity.  She has some decrease sensation light touch on the left side as compared to the right.     ED Results / Procedures / Treatments   Labs (all labs ordered are listed, but only abnormal results are displayed) Labs Reviewed  APTT - Abnormal; Notable for the following components:      Result Value   aPTT 42 (*)  All other components within normal limits  CBC - Abnormal; Notable for the following components:   RDW 18.4 (*)    All other components within normal limits  COMPREHENSIVE METABOLIC PANEL - Abnormal; Notable for the following components:   Chloride 97 (*)    Glucose, Bld 327 (*)    BUN 29 (*)    Creatinine, Ser 2.54 (*)    Total Protein 5.8 (*)    Albumin 2.7 (*)    GFR, Estimated 18 (*)    All other components within normal limits  I-STAT CHEM 8, ED - Abnormal; Notable for the following components:   BUN 30 (*)    Creatinine, Ser 2.60 (*)    Glucose, Bld 318 (*)    Calcium, Ion 1.10 (*)    Hemoglobin 15.6 (*)    All other components within normal limits  PROTIME-INR  DIFFERENTIAL  ETHANOL  RAPID URINE DRUG SCREEN, HOSP PERFORMED  URINALYSIS, ROUTINE W REFLEX MICROSCOPIC    EKG EKG Interpretation  Date/Time:  Monday February 21 2022 12:43:56 EST Ventricular Rate:  75 PR Interval:  203 QRS Duration: 93 QT Interval:  418 QTC Calculation: 467 R Axis:   12 Text Interpretation: Sinus rhythm Probable left atrial enlargement Probable left ventricular hypertrophy Nonspecific T abnormalities, inferior leads since last tracing no significant change Confirmed by Malvin Johns [phone]) on 02/21/2022 1:31:51 PM  Radiology CT HEAD WO CONTRAST  Result Date: 02/21/2022 CLINICAL DATA:  Headache. EXAM: CT HEAD WITHOUT CONTRAST TECHNIQUE: Contiguous axial images were obtained from the base of the skull through the vertex without intravenous contrast. RADIATION DOSE REDUCTION: This exam was performed according to the departmental  dose-optimization program which includes automated exposure control, adjustment of the mA and/or kV according to patient size and/or use of iterative reconstruction technique. COMPARISON:  Head CT dated 11/30/2021. FINDINGS: Brain: Ventricles are stable in size and configuration. Old infarcts are seen within the RIGHT frontal lobe and RIGHT occipital lobe. Mild chronic small vessel ischemic changes noted within the bilateral periventricular white matter regions. There is no mass, hemorrhage, edema or other evidence of acute parenchymal abnormality. No extra-axial hemorrhage. Vascular: Chronic calcified atherosclerotic changes of the large vessels at the skull base. Vascular stent again noted within the RIGHT M1 segment. No unexpected hyperdense vessel. Skull: No acute-appearing osseous abnormality. Sinuses/Orbits: Visualized upper paranasal sinuses are clear. Visualized upper periorbital and retro-orbital soft tissues are unremarkable. Other: None. IMPRESSION: 1. No acute findings. No intracranial mass, hemorrhage or edema. 2. Old infarcts within the RIGHT frontal lobe and RIGHT occipital lobe with associated chronic encephalomalacia. 3. Mild chronic small vessel ischemic changes within the bilateral periventricular white matter regions. Electronically Signed   By: Franki Cabot [name]   On: 02/21/2022 15:44    Procedures Procedures    Medications Ordered in ED Medications  acetaminophen (TYLENOL) tablet 1,000 mg (has no administration in time range)    ED Course/ Medical Decision Making/ A&P                             Medical Decision Making Amount and/or Complexity of Data Reviewed Labs: ordered. Radiology: ordered.  Risk OTC drugs.   Patient presents with dizziness and nausea that she woke up with this morning.  She also seems to have a little bit of increased left-sided weakness as compared to her baseline weakness.  She has some vision changes that have been progressing over the last 2  days.  She is awaiting a head CT.  If negative, will likely need an MRI.  Her creatinine is elevated but on chart review is similar to prior values.  Hemoglobin is nonconcerning.  Patient care turned over to Dr. Vanita Panda pending imaging studies and reevaluation.  Final Clinical Impression(s) / ED Diagnoses Final diagnoses:  None    Rx / DC Orders ED Discharge Orders     None         Malvin Johns, MD 02/21/22 1555

## 2022-02-21 NOTE — Discharge Instructions (Signed)
As discussed, today's evaluation has been generally reassuring.  However, monitor your condition carefully and do not hesitate to return here for concerning changes.  Otherwise go to dialysis tomorrow, and follow-up with your physician.

## 2022-02-21 NOTE — ED Triage Notes (Signed)
BIB EMS from dialysis center for sudden onset of posterior headache and nausea associated with dizziness.  Patient only did one hour of dialysis and doesn't take her BP meds until after dialysis on days she does dialysis.  Also mentions loss of vision left peripheral that she noticed started yesterday.  Pupils are unequal but patient reports that is normal and blurred vision in left eye is normal but not loss.

## 2022-02-21 NOTE — ED Notes (Signed)
Patient transported to MRI 

## 2022-02-28 ENCOUNTER — Encounter (HOSPITAL_COMMUNITY): Payer: Self-pay

## 2022-02-28 ENCOUNTER — Other Ambulatory Visit: Payer: Self-pay

## 2022-02-28 ENCOUNTER — Emergency Department (HOSPITAL_COMMUNITY): Payer: Medicare PPO

## 2022-02-28 ENCOUNTER — Observation Stay (HOSPITAL_COMMUNITY)
Admission: EM | Admit: 2022-02-28 | Discharge: 2022-03-02 | Disposition: A | Discharge status: Home / Self Care | Payer: Medicare PPO | Attending: Family Medicine | Admitting: Family Medicine

## 2022-02-28 DIAGNOSIS — E1122 Type 2 diabetes mellitus with diabetic chronic kidney disease: Secondary | ICD-10-CM | POA: Diagnosis not present

## 2022-02-28 DIAGNOSIS — I10 Essential (primary) hypertension: Secondary | ICD-10-CM | POA: Diagnosis present

## 2022-02-28 DIAGNOSIS — Z992 Dependence on renal dialysis: Secondary | ICD-10-CM | POA: Insufficient documentation

## 2022-02-28 DIAGNOSIS — Z8673 Personal history of transient ischemic attack (TIA), and cerebral infarction without residual deficits: Secondary | ICD-10-CM | POA: Insufficient documentation

## 2022-02-28 DIAGNOSIS — Z7982 Long term (current) use of aspirin: Secondary | ICD-10-CM | POA: Diagnosis not present

## 2022-02-28 DIAGNOSIS — I132 Hypertensive heart and chronic kidney disease with heart failure and with stage 5 chronic kidney disease, or end stage renal disease: Secondary | ICD-10-CM | POA: Diagnosis not present

## 2022-02-28 DIAGNOSIS — I2510 Atherosclerotic heart disease of native coronary artery without angina pectoris: Secondary | ICD-10-CM | POA: Diagnosis not present

## 2022-02-28 DIAGNOSIS — R7989 Other specified abnormal findings of blood chemistry: Principal | ICD-10-CM | POA: Diagnosis present

## 2022-02-28 DIAGNOSIS — I5032 Chronic diastolic (congestive) heart failure: Secondary | ICD-10-CM | POA: Diagnosis not present

## 2022-02-28 DIAGNOSIS — E785 Hyperlipidemia, unspecified: Secondary | ICD-10-CM | POA: Diagnosis present

## 2022-02-28 DIAGNOSIS — E669 Obesity, unspecified: Secondary | ICD-10-CM | POA: Diagnosis not present

## 2022-02-28 DIAGNOSIS — E1129 Type 2 diabetes mellitus with other diabetic kidney complication: Secondary | ICD-10-CM | POA: Diagnosis present

## 2022-02-28 DIAGNOSIS — R0602 Shortness of breath: Secondary | ICD-10-CM | POA: Diagnosis present

## 2022-02-28 DIAGNOSIS — Z794 Long term (current) use of insulin: Secondary | ICD-10-CM | POA: Insufficient documentation

## 2022-02-28 DIAGNOSIS — Z952 Presence of prosthetic heart valve: Secondary | ICD-10-CM

## 2022-02-28 DIAGNOSIS — Z79899 Other long term (current) drug therapy: Secondary | ICD-10-CM | POA: Diagnosis not present

## 2022-02-28 DIAGNOSIS — N186 End stage renal disease: Secondary | ICD-10-CM | POA: Diagnosis not present

## 2022-02-28 LAB — BASIC METABOLIC PANEL
Anion gap: 14 (ref 5–15)
BUN: 12 mg/dL (ref 8–23)
CO2: 26 mmol/L (ref 22–32)
Calcium: 8.9 mg/dL (ref 8.9–10.3)
Chloride: 96 mmol/L — ABNORMAL LOW (ref 98–111)
Creatinine, Ser: 2.34 mg/dL — ABNORMAL HIGH (ref 0.44–1.00)
GFR, Estimated: 20 mL/min — ABNORMAL LOW (ref >60)
Glucose, Bld: 255 mg/dL — ABNORMAL HIGH (ref 70–99)
Potassium: 4.2 mmol/L (ref 3.5–5.1)
Sodium: 136 mmol/L (ref 135–145)

## 2022-02-28 LAB — CBC WITH DIFFERENTIAL/PLATELET
Abs Immature Granulocytes: 0.02 10*3/uL (ref 0.00–0.07)
Basophils Absolute: 0.1 10*3/uL (ref 0.0–0.1)
Basophils Relative: 1 %
Eosinophils Absolute: 0.1 10*3/uL (ref 0.0–0.5)
Eosinophils Relative: 1 %
HCT: 41.7 % (ref 36.0–46.0)
Hemoglobin: 14 g/dL (ref 12.0–15.0)
Immature Granulocytes: 0 %
Lymphocytes Relative: 40 %
Lymphs Abs: 3.1 10*3/uL (ref 0.7–4.0)
MCH: 29.8 pg (ref 26.0–34.0)
MCHC: 33.6 g/dL (ref 30.0–36.0)
MCV: 88.7 fL (ref 80.0–100.0)
Monocytes Absolute: 0.6 10*3/uL (ref 0.1–1.0)
Monocytes Relative: 8 %
Neutro Abs: 4 10*3/uL (ref 1.7–7.7)
Neutrophils Relative %: 50 %
Platelets: 259 10*3/uL (ref 150–400)
RBC: 4.7 MIL/uL (ref 3.87–5.11)
RDW: 17.1 % — ABNORMAL HIGH (ref 11.5–15.5)
WBC: 7.9 10*3/uL (ref 4.0–10.5)
nRBC: 0 % (ref 0.0–0.2)

## 2022-02-28 LAB — CBG MONITORING, ED: Glucose-Capillary: 228 mg/dL — ABNORMAL HIGH (ref 70–99)

## 2022-02-28 LAB — TROPONIN I (HIGH SENSITIVITY)
Troponin I (High Sensitivity): 281 ng/L (ref <18)
Troponin I (High Sensitivity): 235 ng/L (ref <18)

## 2022-02-28 LAB — BRAIN NATRIURETIC PEPTIDE: B Natriuretic Peptide: 579.2 pg/mL — ABNORMAL HIGH (ref 0.0–100.0)

## 2022-02-28 MED ORDER — HYDRALAZINE HCL 50 MG PO TABS
100.0000 mg | ORAL_TABLET | Freq: Three times a day (TID) | ORAL | Status: DC
  Administered 2022-02-28 – 2022-03-02 (×5): 100 mg via ORAL
  Filled 2022-02-28 (×5): qty 2

## 2022-02-28 MED ORDER — LOSARTAN POTASSIUM 25 MG PO TABS
25.0000 mg | ORAL_TABLET | Freq: Every day | ORAL | Status: DC
  Administered 2022-02-28 – 2022-03-01 (×2): 25 mg via ORAL
  Filled 2022-02-28 (×2): qty 1

## 2022-02-28 MED ORDER — ISOSORBIDE MONONITRATE ER 60 MG PO TB24
240.0000 mg | ORAL_TABLET | Freq: Every day | ORAL | Status: DC
  Administered 2022-03-01 – 2022-03-02 (×2): 240 mg via ORAL
  Filled 2022-02-28: qty 8
  Filled 2022-02-28: qty 4

## 2022-02-28 MED ORDER — ACETAMINOPHEN 325 MG PO TABS
650.0000 mg | ORAL_TABLET | ORAL | Status: DC | PRN
  Administered 2022-03-02: 650 mg via ORAL
  Filled 2022-02-28: qty 2

## 2022-02-28 MED ORDER — HEPARIN (PORCINE) 25000 UT/250ML-% IV SOLN
750.0000 [IU]/h | INTRAVENOUS | Status: DC
  Administered 2022-02-28 – 2022-03-02 (×2): 750 [IU]/h via INTRAVENOUS
  Filled 2022-02-28 (×2): qty 250

## 2022-02-28 MED ORDER — BRIMONIDINE TARTRATE 0.2 % OP SOLN
1.0000 [drp] | Freq: Every day | OPHTHALMIC | Status: DC
  Administered 2022-03-01: 1 [drp] via OPHTHALMIC
  Filled 2022-02-28: qty 5

## 2022-02-28 MED ORDER — ASPIRIN 325 MG PO TABS
325.0000 mg | ORAL_TABLET | Freq: Every day | ORAL | Status: DC
  Administered 2022-02-28: 325 mg via ORAL
  Filled 2022-02-28: qty 1

## 2022-02-28 MED ORDER — VENLAFAXINE HCL ER 150 MG PO CP24
150.0000 mg | ORAL_CAPSULE | Freq: Every day | ORAL | Status: DC
  Administered 2022-03-01 – 2022-03-02 (×2): 150 mg via ORAL
  Filled 2022-02-28: qty 2
  Filled 2022-02-28: qty 1

## 2022-02-28 MED ORDER — AMLODIPINE BESYLATE 5 MG PO TABS
5.0000 mg | ORAL_TABLET | Freq: Every day | ORAL | Status: DC
  Administered 2022-03-01 – 2022-03-02 (×2): 5 mg via ORAL
  Filled 2022-02-28 (×2): qty 1

## 2022-02-28 MED ORDER — HEPARIN BOLUS VIA INFUSION
3800.0000 [IU] | Freq: Once | INTRAVENOUS | Status: AC
  Administered 2022-02-28: 3800 [IU] via INTRAVENOUS
  Filled 2022-02-28: qty 3800

## 2022-02-28 MED ORDER — MIRTAZAPINE 7.5 MG PO TABS
7.5000 mg | ORAL_TABLET | Freq: Every day | ORAL | Status: DC
  Administered 2022-02-28 – 2022-03-01 (×2): 7.5 mg via ORAL
  Filled 2022-02-28 (×3): qty 1

## 2022-02-28 MED ORDER — INSULIN ASPART 100 UNIT/ML IJ SOLN
0.0000 [IU] | Freq: Three times a day (TID) | INTRAMUSCULAR | Status: DC
  Administered 2022-02-28: 3 [IU] via SUBCUTANEOUS
  Administered 2022-03-01: 2 [IU] via SUBCUTANEOUS
  Administered 2022-03-01: 7 [IU] via SUBCUTANEOUS

## 2022-02-28 MED ORDER — LABETALOL HCL 200 MG PO TABS
200.0000 mg | ORAL_TABLET | Freq: Two times a day (BID) | ORAL | Status: DC
  Administered 2022-03-01 – 2022-03-02 (×3): 200 mg via ORAL
  Filled 2022-02-28 (×3): qty 1

## 2022-02-28 MED ORDER — FOLIC ACID 1 MG PO TABS
1.0000 mg | ORAL_TABLET | Freq: Every day | ORAL | Status: DC
  Administered 2022-03-01 – 2022-03-02 (×2): 1 mg via ORAL
  Filled 2022-02-28 (×2): qty 1

## 2022-02-28 MED ORDER — ASPIRIN 81 MG PO TBEC
81.0000 mg | DELAYED_RELEASE_TABLET | Freq: Every day | ORAL | Status: DC
  Administered 2022-03-01 – 2022-03-02 (×2): 81 mg via ORAL
  Filled 2022-02-28 (×2): qty 1

## 2022-02-28 MED ORDER — ONDANSETRON HCL 4 MG/2ML IJ SOLN: 4.0000 mg | Freq: Four times a day (QID) | INTRAMUSCULAR | Status: DC | PRN

## 2022-02-28 NOTE — Assessment & Plan Note (Addendum)
-  Initial Troponin of 281. Chest pain free. Could be demand ischemic stress during dialysis, however this is higher than her usual baseline of 40s. Will continue to trend overnight -she has hx of CAD s/p distal RCA stent and a TAVR. Her last cath in 10/2018 showed moderate ostial RCA stenosis, no obstructive disease in the left main artery, LAD or Circumflex. The distal RCA stent is patent -Last echocardiogram on 01/2022 with EF of 50-55%, Grade II diastolic dysfunction. Normal structure and function of aortic valve prosthesis.  -cardiology has evaluated and recommends trending troponin, serial EKG and to obtain echocardiogram in the morning. They would like her to start heparin infusion for at least 48 hours.  -continue Imdur

## 2022-02-28 NOTE — ED Provider Triage Note (Signed)
Emergency Medicine Provider Triage Evaluation Note  [name] , a 82 y.o. female  was evaluated in triage.  Pt complains of sudden onset shortness of breath.  Patient finished dialysis and returned home around 230 today in which she started developing shortness of breath and chest discomfort.  This is uncommon following dialysis.  No new recent cough, fever, URI symptoms.  Denies abdominal pain or chest pain.  Hx of ESRD on dialysis, CHF, s/p TAVR, essential HTN, hyperlipidemia, prior CVA, CAD, OSA, noncompliance with medical treatment, DMT2  Review of Systems  Positive:  Negative: See above  Physical Exam  BP (!) 146/60   Pulse 81   Temp (!) 97.5 F (36.4 C) (Oral)   Resp (!) 28   Ht 5' 2"$  (1.575 m)   Wt 65.8 kg   SpO2 100%   BMI 26.53 kg/m  Gen:   Awake, no distress   Resp:  Tachypneic, mild increased respiratory effort, equal chest rise, overall clear lung sounds MSK:   Moves extremities without difficulty  Other:  Chest non-TTP, no crepitus.  Abdomen non-TTP, soft, nondistended.  Sitting in wheelchair.  Appears uncomfortable.  Medical Decision Making  Medically screening exam initiated at 5:35 PM.  Appropriate orders placed.  [employer]ag[employer] was informed that the remainder of the evaluation will be completed by another provider, this initial triage assessment does not replace that evaluation, and the importance of remaining in the ED until their evaluation is complete.  Discussed patient with triage nurse, patient to come back to next available room as soon as possible.   Prince Rome, PA-C [phone]

## 2022-02-28 NOTE — Assessment & Plan Note (Signed)
-  on Repatha 

## 2022-02-28 NOTE — ED Triage Notes (Signed)
Pt c/o SOB that started today after dialysis. Pt goes to dialysis M, Tues, Thurs, Fri. Pt is tachypneic. Pt denies any other complaints.

## 2022-02-28 NOTE — Progress Notes (Signed)
Discussed with ED provider, does not appear to be ACS at this time. No chest pain or EKG changes and troponin elevation could be due to renal failure. Discussed with my interventional partners who have cath'ed her in the past. Will continue to trend tropinins, serial EKGs. I have requested echocardiogram be ordered and patient be placed on heparin gtt for at least 48 hours. Cardiology will follow. Please call or text me directl y at [phone] with any questions or concerns, especially any acute changes.   Floydene Flock, DO [facility]  Cell: [phone]

## 2022-02-28 NOTE — Assessment & Plan Note (Signed)
-  appears euvolemic on exam with no CXR findings of fluid overload

## 2022-02-28 NOTE — Assessment & Plan Note (Signed)
-  continue home amlodipine, hydralazine, Imur, Labetalol and Losartan

## 2022-02-28 NOTE — Assessment & Plan Note (Signed)
-  Last A1c of 7.2 in 08/2021 -place on sensitive SSI ACHS

## 2022-02-28 NOTE — ED Provider Notes (Signed)
[facility] Provider Note   CSN: [id number] Arrival date & time: 02/28/22  1726     History  Chief Complaint  Patient presents with   Shortness of Breath    [name] is a 82 y.o. female.  This is a 82 year old female with history of ESRD on HD Monday/Wednesday/Friday, type 2 diabetes, TAVR, and heart failure presenting to the ED for shortness of breath.  Patient states she received her full session of dialysis today, afterwards started feeling short of breath.  She states it felt like she could not catch her breath, she is not having any chest pain.  It is minimally improved with rest and slightly worse with exertion.  She denies any history of blood clots, states her weight has been stable and she measures daily, denies any lower extremity edema or changes in her diet.    Home Medications Prior to Admission medications   Medication Sig Start Date End Date Taking? Authorizing Provider  Acetaminophen 500 MG capsule Take 500 mg by mouth every 6 (six) hours as needed for moderate pain or headache.    [provider]  albuterol (VENTOLIN HFA) 108 (90 Base) MCG/ACT inhaler Inhale 2 puffs into the lungs every 6 (six) hours as needed for wheezing or shortness of breath. 01/24/22   [name], MD  amLODipine (NORVASC) 5 MG tablet Take 1 tablet (5 mg total) by mouth daily. 01/25/22   [name], MD  aspirin EC 81 MG tablet Take 81 mg by mouth daily.    [provider]  brimonidine (ALPHAGAN) 0.2 % ophthalmic solution Place 1 drop into the left eye daily. 04/19/19   [provider]  ferrous sulfate 325 (65 FE) MG tablet Take 325 mg by mouth daily with breakfast.    [provider]  folic acid (FOLVITE) 1 MG tablet Take 1 mg by mouth daily.    [provider]  HUMALOG KWIKPEN 100 UNIT/ML KwikPen Inject 1-5 Units into the skin See admin instructions. Inject 1-5 units 3 times daily with meals and at  bedtime. Dose depends on what pt is eating. 05/28/18   [provider]  hydrALAZINE (APRESOLINE) 100 MG tablet Take 100 mg by mouth 3 (three) times daily. 08/09/21   [provider]  Insulin Degludec (TRESIBA) 100 UNIT/ML SOLN Inject 10 Units into the skin at bedtime. 01/24/22   [name], MD  isosorbide mononitrate (IMDUR) 120 MG 24 hr tablet Take 2 tablets (240 mg total) by mouth daily. 08/23/21 02/19/22  Patwardhan, Reynold Bowen, MD  labetalol (NORMODYNE) 200 MG tablet TAKE 1 TABLET BY MOUTH TWICE A DAY 11/02/21   Patwardhan, Manish J, MD  losartan (COZAAR) 25 MG tablet Take 1 tablet (25 mg total) by mouth at bedtime. 01/24/22   [name], MD  mirtazapine (REMERON) 7.5 MG tablet Take 7.5 mg by mouth at bedtime. 10/12/21   [provider]  predniSONE (DELTASONE) 10 MG tablet Prednisone 30 mg po daily x 1 day then Prednisone 20 mg po daily x 1 day then Prednisone 10 mg daily x 3 day then stop... 01/24/22   [name], MD  REPATHA SURECLICK [id number] MG/ML SOAJ INJECT 1 ML INTO THE SKIN EVERY 14 (FOURTEEN) DAYS. 02/05/21   Patwardhan, Reynold Bowen, MD  venlafaxine XR (EFFEXOR-XR) 150 MG 24 hr capsule Take 1 capsule (150 mg total) by mouth daily. 02/27/20   Bary Leriche, PA-C  Allergies    Patient has no known allergies.    Review of Systems   Review of Systems  Constitutional:  Negative for chills and fever.  HENT:  Negative for ear pain and sore throat.   Eyes:  Negative for pain and visual disturbance.  Respiratory:  Positive for shortness of breath. Negative for cough.   Cardiovascular:  Negative for chest pain and palpitations.  Gastrointestinal:  Negative for abdominal pain and vomiting.  Genitourinary:  Negative for dysuria and hematuria.  Musculoskeletal:  Negative for arthralgias and back pain.  Skin:  Negative for color change and rash.  Neurological:  Negative for seizures and syncope.  All other systems reviewed and are negative.   Physical Exam Updated Vital  Signs BP (!) 150/54   Pulse 64   Temp 98.4 F (36.9 C)   Resp 13   Ht 5' 2"$  (1.575 m)   Wt 65.8 kg   SpO2 94%   BMI 26.53 kg/m  Physical Exam Vitals and nursing note reviewed.  Constitutional:      General: She is not in acute distress.    Appearance: She is well-developed.  HENT:     Head: Normocephalic and atraumatic.  Eyes:     Conjunctiva/sclera: Conjunctivae normal.  Cardiovascular:     Rate and Rhythm: Normal rate and regular rhythm.     Heart sounds: Murmur (systolic) heard.  Pulmonary:     Effort: Pulmonary effort is normal. No respiratory distress.     Breath sounds: Normal breath sounds. No decreased breath sounds, wheezing, rhonchi or rales.  Abdominal:     Palpations: Abdomen is soft.     Tenderness: There is no abdominal tenderness.  Musculoskeletal:        General: No swelling.     Cervical back: Neck supple.     Right lower leg: No edema.     Left lower leg: No edema.  Skin:    General: Skin is warm and dry.     Capillary Refill: Capillary refill takes less than 2 seconds.  Neurological:     Mental Status: She is alert.  Psychiatric:        Mood and Affect: Mood normal.     ED Results / Procedures / Treatments   Labs (all labs ordered are listed, but only abnormal results are displayed) Labs Reviewed  BASIC METABOLIC PANEL - Abnormal; Notable for the following components:      Result Value   Chloride 96 (*)    Glucose, Bld 255 (*)    Creatinine, Ser 2.34 (*)    GFR, Estimated 20 (*)    All other components within normal limits  BRAIN NATRIURETIC PEPTIDE - Abnormal; Notable for the following components:   B Natriuretic Peptide 579.2 (*)    All other components within normal limits  CBC WITH DIFFERENTIAL/PLATELET - Abnormal; Notable for the following components:   RDW 17.1 (*)    All other components within normal limits  CBG MONITORING, ED - Abnormal; Notable for the following components:   Glucose-Capillary 228 (*)    All other components  within normal limits  TROPONIN I (HIGH SENSITIVITY) - Abnormal; Notable for the following components:   Troponin I (High Sensitivity) 281 (*)    All other components within normal limits  TROPONIN I (HIGH SENSITIVITY) - Abnormal; Notable for the following components:   Troponin I (High Sensitivity) 235 (*)    All other components within normal limits  HEPARIN LEVEL (UNFRACTIONATED)  CBC  TROPONIN I (  HIGH SENSITIVITY)    EKG EKG Interpretation  Date/Time:  Monday February 28 2022 17:15:41 EST Ventricular Rate:  79 PR Interval:  202 QRS Duration: 88 QT Interval:  392 QTC Calculation: 449 R Axis:   14 Text Interpretation: Normal sinus rhythm Nonspecific T wave abnormality Abnormal ECG When compared with ECG of 28-Feb-2022 17:13, PREVIOUS ECG IS PRESENT Confirmed by Noemi Chapel [phone]) on 02/28/2022 6:34:00 PM  Radiology DG Chest 2 View  Result Date: 02/28/2022 CLINICAL DATA:  Shortness of breath EXAM: CHEST - 2 VIEW COMPARISON:  01/22/2022 FINDINGS: The heart size and mediastinal contours are within normal limits. Cardiomegaly. Implantable loop recorder. Disc degenerative disease of the thoracic spine. IMPRESSION: Cardiomegaly without acute abnormality of the lungs. Electronically Signed   By: Delanna Ahmadi [name]   On: 02/28/2022 19:18    Procedures Procedures    Medications Ordered in ED Medications  acetaminophen (TYLENOL) tablet 650 mg (has no administration in time range)  ondansetron (ZOFRAN) injection 4 mg (has no administration in time range)  heparin ADULT infusion 100 units/mL (25000 units/28m) (750 Units/hr Intravenous New Bag/Given 02/28/22 2124)  aspirin EC tablet 81 mg (has no administration in time range)  amLODipine (NORVASC) tablet 5 mg (has no administration in time range)  hydrALAZINE (APRESOLINE) tablet 100 mg (100 mg Oral Given 02/28/22 2229)  labetalol (NORMODYNE) tablet 200 mg (has no administration in time range)  isosorbide mononitrate (IMDUR) 24 hr  tablet 240 mg (has no administration in time range)  losartan (COZAAR) tablet 25 mg (25 mg Oral Given 02/28/22 2228)  mirtazapine (REMERON) tablet 7.5 mg (7.5 mg Oral Given 02/28/22 2228)  venlafaxine XR (EFFEXOR-XR) 24 hr capsule 150 mg (has no administration in time range)  folic acid (FOLVITE) tablet 1 mg (has no administration in time range)  brimonidine (ALPHAGAN) 0.2 % ophthalmic solution 1 drop (has no administration in time range)  insulin aspart (novoLOG) injection 0-9 Units (3 Units Subcutaneous Given 02/28/22 2229)  heparin bolus via infusion 3,800 Units (3,800 Units Intravenous Bolus from Bag 02/28/22 2124)    ED Course/ Medical Decision Making/ A&P                             Medical Decision Making Patient presents with vague symptoms of shortness of breath after dialysis.  She does not appear acutely volume overloaded on exam, her lung bases are clear, she has no peripheral edema, and received adequate dialysis today, I have lower suspicion for acute heart failure exacerbation at this time.  Patient's shortness of breath could represent ACS, we will send serial troponins and EKG.  Chest x-ray ordered evaluate for pneumothorax as well as CBC, BMP, BNP.  Patient empirically given 325 aspirin.  I personally reviewed and interpreted patient's labs which showed stable creatinine of 2.34, hyperglycemia 255, otherwise normal BMP.  BNP elevated at 579 with a troponin of 281.  I am concerned for possible ACS versus acute heart failure exacerbation.  I called and discussed this case with cardiology who requested formal echocardiogram and admission to hospitalist.  I personally reviewed and interpreted patient's chest x-ray and agree with radiology, cardiomegaly without overt pulmonary edema.  Personally reviewed and interpreted patient's EKG which shows T wave flattening across the precordial leads which is on prior ECGs, likely variations with lead placement.  No acute ischemic changes.  I  called discussed this case with the hospitalist team who agreed to patient to her service for further management.  Plan to defer starting heparin to primary team and cardiology.  Patient was stable upon admission to hospital.  Problems Addressed: Elevated troponin: acute illness or injury that poses a threat to life or bodily functions Shortness of breath: acute illness or injury that poses a threat to life or bodily functions  Amount and/or Complexity of Data Reviewed External Data Reviewed: notes.    Details: Follows with [facility] for dialysis, last seen today with no acute events Labs: ordered. Decision-making details documented in ED Course. Radiology: ordered and independent interpretation performed. Decision-making details documented in ED Course. ECG/medicine tests: ordered and independent interpretation performed. Decision-making details documented in ED Course.  Risk Prescription drug management. Decision regarding hospitalization.         Final Clinical Impression(s) / ED Diagnoses Final diagnoses:  Elevated troponin  Shortness of breath    Rx / DC Orders ED Discharge Orders     None         Jimmie Molly, MD 02/28/22 2351    Noemi Chapel, MD 03/01/22 1014

## 2022-02-28 NOTE — Progress Notes (Signed)
ANTICOAGULATION CONSULT NOTE - Initial Consult  Pharmacy Consult for heparin Indication: chest pain/ACS  No Known Allergies  Patient Measurements: Height: 5' 2"$  (157.5 cm) Weight: 65.8 kg (145 lb 1 oz) IBW/kg (Calculated) : 50.1 Heparin Dosing Weight: 63kg  Vital Signs: Temp: 97.5 F (36.4 C) (02/12 1732) Temp Source: Oral (02/12 1732) BP: 132/69 (02/12 2000) Pulse Rate: 69 (02/12 2000)  Labs: Recent Labs    02/28/22 1743  HGB 14.0  HCT 41.7  PLT 259  CREATININE 2.34*  TROPONINIHS 281*    Estimated Creatinine Clearance: 16.2 mL/min (A) (by C-G formula based on SCr of 2.34 mg/dL (H)).   Medical History: Past Medical History:  Diagnosis Date   Arthritis    Cerebrovascular disease    CHF (congestive heart failure) ([place])    CKD (chronic kidney disease)    Sees Dr Hollie Salk   Coronary artery disease    Depression    Diabetes ([place])    INSULIN DEPENDENT   Diabetic peripheral neuropathy (HCC)    Diabetic retinopathy ([place])    Diverticulitis    GERD (gastroesophageal reflux disease)    History of CVA (cerebrovascular accident)    x 2 no residulal   Hyperlipidemia    Hypertension    Obesity    Renal lesion    S/P TAVR (transcatheter aortic valve replacement) 11/13/2018   s/p TAVR with a 39m Edwards S3U via the TF approach by Dr. ORoxy Mannsand Dr. MAngelena Form  Severe aortic stenosis     Assessment: 89YOF presenting with SOB, concern for ACS, she is not on anticoagulation PTA  Goal of Therapy:  Heparin level 0.3-0.7 units/ml Monitor platelets by anticoagulation protocol: Yes   Plan:  Heparin 3800 units IV x 1, and gtt at 750 units/hr F/u 8 hour heparin level F/u LOT and cards recs  JBertis Ruddy PharmD, BBlack EarthPharmacist ED Pharmacist Phone # [phone]/12/2022 8:59 PM

## 2022-02-28 NOTE — ED Notes (Signed)
Shift report received, assumed care of patient at this time.

## 2022-02-28 NOTE — H&P (Addendum)
History and Physical    Patient: [name] [id number] DOB: August 26, 1940 DOA: 02/28/2022 DOS: the patient was seen and examined on 02/28/2022 PCP: Audley Hose, MD  Patient coming from: Home  Chief Complaint:  Chief Complaint  Patient presents with   Shortness of Breath   HPI: [name] is a 82 y.o. female with medical history significant of ESRD HD MWF, CAD s/p RCA stent, chronic diastolic HF, Type 2 DM, CAD, CVA, AS s/p TAVR (2020), PVD, OSA not on CPAP who presents with shortness of breath after dialysis.   Patient had dialysis earlier today and already felt unwell with nausea. Upon returning home had increase shortness of breath when laying down. No chest pain or tightness. No vomiting. She was last hospitalized in January for acute hypoxemic respiratory failure from RSV, CHF exacerbation and volume overnight. Denies any baseline increase in dyspnea prior to today.   In the ED, she was afebrile, normotensive on room air.   No leukocytosis. Hgb of 14.   Creatinine of 2.6 with it being anywhere from 2-3.7 in the past month. CBG of 255. No other electrolyte abnormalities.   Troponin of 281 and prior has been in the 40s. BNP of 579.  CXR on my review with cardiomegaly but no clear edema.  EKG with NSR, borderline prolonged PR interval and non-specific flattening of T waves in the inferior leads.   Cardiology was consulted and attributes elevated troponin to renal failure. Recommend trending troponins and serial EKG. Recommended echo and heparin gtt for 48 hrs.  Hospitalist then consulted for admission.  Review of Systems: As mentioned in the history of present illness. All other systems reviewed and are negative. Past Medical History:  Diagnosis Date   Arthritis    Cerebrovascular disease    CHF (congestive heart failure) ([place])    CKD (chronic kidney disease)    Sees Dr Hollie Salk   Coronary artery disease    Depression    Diabetes ([place])    INSULIN DEPENDENT    Diabetic peripheral neuropathy (HCC)    Diabetic retinopathy ([place])    Diverticulitis    GERD (gastroesophageal reflux disease)    History of CVA (cerebrovascular accident)    x 2 no residulal   Hyperlipidemia    Hypertension    Obesity    Renal lesion    S/P TAVR (transcatheter aortic valve replacement) 11/13/2018   s/p TAVR with a 2m Edwards S3U via the TF approach by Dr. ORoxy Mannsand Dr. MAngelena Form  Severe aortic stenosis    Past Surgical History:  Procedure Laterality Date   ABDOMINAL HYSTERECTOMY     AV FISTULA PLACEMENT Left 12/18/2017   Procedure: ARTERIOVENOUS (AV) FISTULA CREATION ARM;  Surgeon: CMarty Heck MD;  [facility]  Service: V[facility];  Laterality: Left;   bilateral cataract surgery     CARDIAC CATHETERIZATION  01/07/2014   DR GEinar Gip  COLON RESECTION  02/1999   COLONOSCOPY     COLOSTOMY  02/1999   COLOSTOMY CLOSURE  07/1999   EYE SURGERY Left 2019   FISTULA SUPERFICIALIZATION Left 06/29/2018   Procedure: FISTULA SUPERFICIALIZATION LEFT BRACHIOCEPHALIC;  Surgeon: CMarty Heck MD;  [place]  Service: Vascular;  Laterality: Left;   LEFT HEART CATHETERIZATION WITH CORONARY ANGIOGRAM N/A 01/07/2014   Procedure: LEFT HEART CATHETERIZATION WITH CORONARY ANGIOGRAM;  Surgeon: JLaverda Page MD;  [facility];  Service: Cardiovascular;  Laterality: N/A;   LOOP RECORDER INSERTION N/A 02/17/2020  Procedure: LOOP RECORDER INSERTION;  Surgeon: Deboraha Sprang, MD;  Location: [facility] CV LAB;  Service: Cardiovascular;  Laterality: N/A;   middle cerebral artery stent placement Right    OTHER SURGICAL HISTORY     laser surgery   PTCA  01/07/2014   DES to RCA    DR Einar Gip   right knee surgery Right    for infection   RIGHT/LEFT HEART CATH AND CORONARY ANGIOGRAPHY N/A 10/23/2018   Procedure: RIGHT/LEFT HEART CATH AND CORONARY ANGIOGRAPHY;  Surgeon: Nigel Mormon, MD;  Location: [facility] CV LAB;  Service: Cardiovascular;   Laterality: N/A;   TEE WITHOUT CARDIOVERSION N/A 11/13/2018   Procedure: TRANSESOPHAGEAL ECHOCARDIOGRAM (TEE);  Surgeon: Burnell Blanks, MD;  Location: [facility];  Service: Open Heart Surgery;  Laterality: N/A;   TRANSCATHETER AORTIC VALVE REPLACEMENT, TRANSFEMORAL  11/13/2018   TRANSCATHETER AORTIC VALVE REPLACEMENT, TRANSFEMORAL N/A 11/13/2018   Procedure: TRANSCATHETER AORTIC VALVE REPLACEMENT, TRANSFEMORAL;  Surgeon: Burnell Blanks, MD;  Location: [facility];  Service: Open Heart Surgery;  Laterality: N/A;   Social History:  reports that she has never smoked. She has never used smokeless tobacco. She reports that she does not drink alcohol and does not use drugs.  No Known Allergies  Family History  Problem Relation Age of Onset   Diabetes Mother    Heart disease Mother    Prostate cancer Father    Hypertension Brother    Prostate cancer Brother     Prior to Admission medications   Medication Sig Start Date End Date Taking? Authorizing Provider  Acetaminophen 500 MG capsule Take 500 mg by mouth every 6 (six) hours as needed for moderate pain or headache.    [provider]  albuterol (VENTOLIN HFA) 108 (90 Base) MCG/ACT inhaler Inhale 2 puffs into the lungs every 6 (six) hours as needed for wheezing or shortness of breath. 01/24/22   [name], MD  amLODipine (NORVASC) 5 MG tablet Take 1 tablet (5 mg total) by mouth daily. 01/25/22   [name], MD  aspirin EC 81 MG tablet Take 81 mg by mouth daily.    [provider]  brimonidine (ALPHAGAN) 0.2 % ophthalmic solution Place 1 drop into the left eye daily. 04/19/19   [provider]  ferrous sulfate 325 (65 FE) MG tablet Take 325 mg by mouth daily with breakfast.    [provider]  folic acid (FOLVITE) 1 MG tablet Take 1 mg by mouth daily.    [provider]  HUMALOG KWIKPEN 100 UNIT/ML KwikPen Inject 1-5 Units into the skin See admin instructions. Inject 1-5 units 3 times daily  with meals and at bedtime. Dose depends on what pt is eating. 05/28/18   [provider]  hydrALAZINE (APRESOLINE) 100 MG tablet Take 100 mg by mouth 3 (three) times daily. 08/09/21   [provider]  Insulin Degludec (TRESIBA) 100 UNIT/ML SOLN Inject 10 Units into the skin at bedtime. 01/24/22   [name], MD  isosorbide mononitrate (IMDUR) 120 MG 24 hr tablet Take 2 tablets (240 mg total) by mouth daily. 08/23/21 02/19/22  Patwardhan, Reynold Bowen, MD  labetalol (NORMODYNE) 200 MG tablet TAKE 1 TABLET BY MOUTH TWICE A DAY 11/02/21   Patwardhan, Manish J, MD  losartan (COZAAR) 25 MG tablet Take 1 tablet (25 mg total) by mouth at bedtime. 01/24/22   [name], MD  mirtazapine (REMERON) 7.5 MG tablet Take 7.5 mg by mouth at bedtime. 10/12/21  [provider]  predniSONE (DELTASONE) 10 MG tablet Prednisone 30 mg po daily x 1 day then Prednisone 20 mg po daily x 1 day then Prednisone 10 mg daily x 3 day then stop... 01/24/22   [name], MD  REPATHA SURECLICK [id number] MG/ML SOAJ INJECT 1 ML INTO THE SKIN EVERY 14 (FOURTEEN) DAYS. 02/05/21   Patwardhan, Reynold Bowen, MD  venlafaxine XR (EFFEXOR-XR) 150 MG 24 hr capsule Take 1 capsule (150 mg total) by mouth daily. 02/27/20   Bary Leriche, PA-C    Physical Exam: Vitals:   02/28/22 2030 02/28/22 2100 02/28/22 2130 02/28/22 2144  BP: (!) 148/52 (!) 147/60 (!) 125/50   Pulse: 61 61 64   Resp: 18 16 (!) 21   Temp:    98.4 F (36.9 C)  TempSrc:      SpO2: 100% 100% 100%   Weight:      Height:       Constitutional: NAD, calm, comfortable, elderly female appearing younger than stated age laying at approximately 20 degree incline in bed Eyes: lids and conjunctivae normal ENMT: Mucous membranes are moist.  Neck: normal, supple Respiratory: clear to auscultation bilaterally, no wheezing, no crackles. Normal respiratory effort. No accessory muscle use.  Cardiovascular: Regular rate and rhythm, no murmurs / rubs / gallops. No extremity  edema. No chest wall tenderness with palpation. Left UE AV fistula.  Abdomen: no tenderness, Bowel sounds positive.  Musculoskeletal: no clubbing / cyanosis. No joint deformity upper and lower extremities.   Skin: no rashes, lesions, ulcers.  Neurologic: CN 2-12 grossly intact. Strength 5/5 in all 4.  Psychiatric: Normal judgment and insight. Alert and oriented x 3. Normal mood. Data Reviewed:  See HPI  Assessment and Plan: * Elevated troponin -Initial Troponin of 281. Chest pain free. Could be demand ischemic stress during dialysis, however this is higher than her usual baseline of 40s. Will continue to trend overnight -she has hx of CAD s/p distal RCA stent and a TAVR. Her last cath in 10/2018 showed moderate ostial RCA stenosis, no obstructive disease in the left main artery, LAD or Circumflex. The distal RCA stent is patent -Last echocardiogram on 01/2022 with EF of 50-55%, Grade II diastolic dysfunction. Normal structure and function of aortic valve prosthesis.  -cardiology has evaluated and recommends trending troponin, serial EKG and to obtain echocardiogram in the morning. They would like her to start heparin infusion for at least 48 hours.  -continue Imdur  DM (diabetes mellitus), type 2 with renal complications (HCC) -Last A1c of 7.2 in 08/2021 -place on sensitive SSI ACHS  Essential hypertension -continue home amlodipine, hydralazine, Imur, Labetalol and Losartan  ESRD on dialysis ([place]) -HD MWF -need nephrology consult for Wednesday if she remains inpatient  Chronic heart failure with preserved ejection fraction ([place]) -appears euvolemic on exam with no CXR findings of fluid overload  S/P TAVR (transcatheter aortic valve replacement) Secondary to AS in 2020  Hyperlipidemia -on Repatha      Advance Care Planning:   Code Status: Full Code   Consults: cardiology  Family Communication: brother at bedside  Severity of Illness: The appropriate patient status for this  patient is OBSERVATION. Observation status is judged to be reasonable and necessary in order to provide the required intensity of service to ensure the patient's safety. The patient's presenting symptoms, physical exam findings, and initial radiographic and laboratory data in the context of their medical condition is felt to place them at decreased risk for further clinical deterioration.  Furthermore, it is anticipated that the patient will be medically stable for discharge from the hospital within 2 midnights of admission.   Author: Orene Desanctis, DO 02/28/2022 10:02 PM  For on call review [email or web address].

## 2022-02-28 NOTE — Assessment & Plan Note (Signed)
-  HD MWF -need nephrology consult for Wednesday if she remains inpatient

## 2022-02-28 NOTE — ED Notes (Signed)
Troponin 281, MD made aware

## 2022-02-28 NOTE — Assessment & Plan Note (Signed)
Secondary to AS in 2020

## 2022-03-01 ENCOUNTER — Observation Stay (HOSPITAL_COMMUNITY): Payer: Medicare PPO

## 2022-03-01 ENCOUNTER — Observation Stay (HOSPITAL_BASED_OUTPATIENT_CLINIC_OR_DEPARTMENT_OTHER): Payer: Medicare PPO

## 2022-03-01 DIAGNOSIS — I10 Essential (primary) hypertension: Secondary | ICD-10-CM | POA: Diagnosis not present

## 2022-03-01 DIAGNOSIS — R0602 Shortness of breath: Secondary | ICD-10-CM | POA: Diagnosis not present

## 2022-03-01 DIAGNOSIS — I5032 Chronic diastolic (congestive) heart failure: Secondary | ICD-10-CM | POA: Diagnosis not present

## 2022-03-01 DIAGNOSIS — E1122 Type 2 diabetes mellitus with diabetic chronic kidney disease: Secondary | ICD-10-CM | POA: Diagnosis not present

## 2022-03-01 DIAGNOSIS — R7989 Other specified abnormal findings of blood chemistry: Secondary | ICD-10-CM | POA: Diagnosis not present

## 2022-03-01 LAB — ECHOCARDIOGRAM COMPLETE
AR max vel: 1.22 cm2
AV Area VTI: 1.31 cm2
AV Area mean vel: 1.41 cm2
AV Mean grad: 14 mmHg
AV Peak grad: 25.4 mmHg
Ao pk vel: 2.52 m/s
Area-P 1/2: 2.66 cm2
Height: 62 in
S' Lateral: 2.6 cm
Weight: 2321 oz

## 2022-03-01 LAB — HEPATITIS B SURFACE ANTIGEN: Hepatitis B Surface Ag: NONREACTIVE

## 2022-03-01 LAB — HEPARIN LEVEL (UNFRACTIONATED)
Heparin Unfractionated: 0.53 IU/mL (ref 0.30–0.70)
Heparin Unfractionated: 0.44 IU/mL (ref 0.30–0.70)

## 2022-03-01 LAB — CBC
HCT: 38.7 % (ref 36.0–46.0)
Hemoglobin: 12.7 g/dL (ref 12.0–15.0)
MCH: 29.7 pg (ref 26.0–34.0)
MCHC: 32.8 g/dL (ref 30.0–36.0)
MCV: 90.6 fL (ref 80.0–100.0)
Platelets: 240 10*3/uL (ref 150–400)
RBC: 4.27 MIL/uL (ref 3.87–5.11)
RDW: 17.5 % — ABNORMAL HIGH (ref 11.5–15.5)
WBC: 6.4 10*3/uL (ref 4.0–10.5)
nRBC: 0 % (ref 0.0–0.2)

## 2022-03-01 LAB — TROPONIN I (HIGH SENSITIVITY)
Troponin I (High Sensitivity): 264 ng/L (ref <18)
Troponin I (High Sensitivity): 225 ng/L (ref <18)

## 2022-03-01 LAB — GLUCOSE, CAPILLARY
Glucose-Capillary: 147 mg/dL — ABNORMAL HIGH (ref 70–99)
Glucose-Capillary: 304 mg/dL — ABNORMAL HIGH (ref 70–99)
Glucose-Capillary: 105 mg/dL — ABNORMAL HIGH (ref 70–99)

## 2022-03-01 LAB — CBG MONITORING, ED
Glucose-Capillary: 181 mg/dL — ABNORMAL HIGH (ref 70–99)
Glucose-Capillary: 152 mg/dL — ABNORMAL HIGH (ref 70–99)

## 2022-03-01 MED ORDER — CHLORHEXIDINE GLUCONATE CLOTH 2 % EX PADS
6.0000 | MEDICATED_PAD | Freq: Every day | CUTANEOUS | Status: DC
  Administered 2022-03-01: 6 via TOPICAL

## 2022-03-01 MED ORDER — PANTOPRAZOLE SODIUM 40 MG PO TBEC
40.0000 mg | DELAYED_RELEASE_TABLET | Freq: Every day | ORAL | Status: DC
  Administered 2022-03-01: 40 mg via ORAL
  Filled 2022-03-01: qty 1

## 2022-03-01 MED ORDER — ALUM & MAG HYDROXIDE-SIMETH 200-200-20 MG/5ML PO SUSP
30.0000 mL | Freq: Once | ORAL | Status: AC
  Administered 2022-03-01: 30 mL via ORAL
  Filled 2022-03-01: qty 30

## 2022-03-01 NOTE — Progress Notes (Signed)
Echocardiogram 2D Echocardiogram has been performed.  [name] 03/01/2022, 9:54 AM

## 2022-03-01 NOTE — Consult Note (Signed)
CARDIOLOGY CONSULT NOTE  Patient ID: SHERESA TESSMER MRN: [id number] DOB/AGE: Oct 21, 1940 82 y.o.  Admit date: 02/28/2022 Referring Physician: [facility] Reason for Consultation:  Troponin elevation  HPI:   82 y.o. African American female with hypertension, hyperlipidemia, diabetes, ESRD now on HD 4 times/week since 01/2022, CAD, h/o strokes (2015, 2020), s/p TAVR for severe AS (11/2018), PAD, with shortness of breath, troponin elevation  Patient presented to [facility] ER on 03/08/2022 after feeling after dialysis.  She also reported shortness of breath and nausea.  At any point she did not have any chest pain.  ER workup showed mildly elevated but flat troponin as well as BNP.  Chest x-ray showed cardiomegaly.  On my interview this morning, patient generalized weakness, numbness in both feet, chills, difficulty talking.  She also reported shortness of breath.  This time, patient was on room air and oxygen saturation was 100%.  Shortness of breath improved after offering supplemental oxygen at 2 L.   Past Medical History:  Diagnosis Date   Arthritis    Cerebrovascular disease    CHF (congestive heart failure) ([place])    CKD (chronic kidney disease)    Sees Dr Hollie Salk   Coronary artery disease    Depression    Diabetes ([place])    INSULIN DEPENDENT   Diabetic peripheral neuropathy ([facility])    Diabetic retinopathy ([place])    Diverticulitis    GERD (gastroesophageal reflux disease)    History of CVA (cerebrovascular accident)    x 2 no residulal   Hyperlipidemia    Hypertension    Obesity    Renal lesion    S/P TAVR (transcatheter aortic valve replacement) 11/13/2018   s/p TAVR with a 59m Edwards S3U via the TF approach by Dr. ORoxy Mannsand Dr. MAngelena Form  Severe aortic stenosis      Past Surgical History:  Procedure Laterality Date   ABDOMINAL HYSTERECTOMY     AV FISTULA PLACEMENT Left 12/18/2017   Procedure: ARTERIOVENOUS (AV) FISTULA CREATION ARM;  Surgeon: CMarty Heck MD;   [facility]  Service: Vascular;  Laterality: Left;   bilateral cataract surgery     CARDIAC CATHETERIZATION  01/07/2014   DR GEinar Gip  COLON RESECTION  02/1999   COLONOSCOPY     COLOSTOMY  02/1999   COLOSTOMY CLOSURE  07/1999   EYE SURGERY Left 2019   FISTULA SUPERFICIALIZATION Left 06/29/2018   Procedure: FISTULA SUPERFICIALIZATION LEFT BRACHIOCEPHALIC;  Surgeon: CMarty Heck MD;  [place]  Service: Vascular;  Laterality: Left;   LEFT HEART CATHETERIZATION WITH CORONARY ANGIOGRAM N/A 01/07/2014   Procedure: LEFT HEART CATHETERIZATION WITH CORONARY ANGIOGRAM;  Surgeon: JLaverda Page MD;  [facility];  Service: Cardiovascular;  Laterality: N/A;   LOOP RECORDER INSERTION N/A 02/17/2020   Procedure: LOOP RECORDER INSERTION;  Surgeon: KDeboraha Sprang MD;  [facility];  Service: Cardiovascular;  Laterality: N/A;   middle cerebral artery stent placement Right    OTHER SURGICAL HISTORY     laser surgery   PTCA  01/07/2014   DES to RCA    DR GEinar Gip  right knee surgery Right    for infection   RIGHT/LEFT HEART CATH AND CORONARY ANGIOGRAPHY N/A 10/23/2018   Procedure: RIGHT/LEFT HEART CATH AND CORONARY ANGIOGRAPHY;  Surgeon: PNigel Mormon MD;  [facility];  Service: Cardiovascular;  Laterality: N/A;   TEE WITHOUT CARDIOVERSION N/A 11/13/2018   Procedure: TRANSESOPHAGEAL ECHOCARDIOGRAM (TEE);  Surgeon:  Burnell Blanks, MD;  Location: [place];  Service: Open Heart Surgery;  Laterality: N/A;   TRANSCATHETER AORTIC VALVE REPLACEMENT, TRANSFEMORAL  11/13/2018   TRANSCATHETER AORTIC VALVE REPLACEMENT, TRANSFEMORAL N/A 11/13/2018   Procedure: TRANSCATHETER AORTIC VALVE REPLACEMENT, TRANSFEMORAL;  Surgeon: Burnell Blanks, MD;  Location: [place];  Service: Open Heart Surgery;  Laterality: N/A;      Family History  Problem Relation Age of Onset   Diabetes Mother    Heart disease Mother    Prostate cancer Father     Hypertension Brother    Prostate cancer Brother      Social History: Social History   Socioeconomic History   Marital status: Widowed    Spouse name: Not on file   Number of children: 2   Years of education: PHD   Highest education level: Not on file  Occupational History   Occupation: Retired PhD Teacher English  Tobacco Use   Smoking status: Never   Smokeless tobacco: Never  Vaping Use   Vaping Use: Never used  Substance and Sexual Activity   Alcohol use: No   Drug use: No   Sexual activity: Not on file  Other Topics Concern   Not on file  Social History Narrative   Lives alone, has brother close by   Patient is right-handed.   Caffeine use: 2-3 cups every day   Social Determinants of Health   Financial Resource Strain: Not on file  Food Insecurity: Not on file  Transportation Needs: Not on file  Physical Activity: Not on file  Stress: Not on file  Social Connections: Not on file  Intimate Partner Violence: Not on file     Medications Prior to Admission  Medication Sig Dispense Refill Last Dose   Acetaminophen 500 MG capsule Take 500 mg by mouth every 6 (six) hours as needed for moderate pain or headache.   unknown   albuterol (VENTOLIN HFA) 108 (90 Base) MCG/ACT inhaler Inhale 2 puffs into the lungs every 6 (six) hours as needed for wheezing or shortness of breath. 8 g 0 unknown   allopurinol (ZYLOPRIM) 100 MG tablet Take 50 mg by mouth daily.   02/28/2022   aspirin EC 81 MG tablet Take 81 mg by mouth daily.   02/28/2022   brimonidine (ALPHAGAN) 0.2 % ophthalmic solution Place 1 drop into the left eye daily.   99991111   folic acid (FOLVITE) 1 MG tablet Take 1 mg by mouth daily.   02/28/2022   HUMALOG KWIKPEN 100 UNIT/ML KwikPen Inject 1-5 Units into the skin See admin instructions. Inject 1-5 units 3 times daily with meals and at bedtime. Dose depends on what pt is eating.   Past Week   hydrALAZINE (APRESOLINE) 100 MG tablet Take 100 mg by mouth 3 (three) times  daily.   02/28/2022   Insulin Degludec (TRESIBA) 100 UNIT/ML SOLN Inject 10 Units into the skin at bedtime. 10 mL 1 Past Week   isosorbide mononitrate (IMDUR) 120 MG 24 hr tablet Take 2 tablets (240 mg total) by mouth daily. 60 tablet 5 02/28/2022   labetalol (NORMODYNE) 200 MG tablet TAKE 1 TABLET BY MOUTH TWICE A DAY 180 tablet 3 02/28/2022   losartan (COZAAR) 25 MG tablet Take 1 tablet (25 mg total) by mouth at bedtime. 30 tablet 2 Past Week   venlafaxine XR (EFFEXOR-XR) 150 MG 24 hr capsule Take 1 capsule (150 mg total) by mouth daily. 30 capsule 0 02/28/2022   amLODipine (NORVASC) 5 MG tablet Take 1  tablet (5 mg total) by mouth daily. (Patient not taking: Reported on 03/01/2022) 30 tablet 2 Not Taking   predniSONE (DELTASONE) 10 MG tablet Prednisone 30 mg po daily x 1 day then Prednisone 20 mg po daily x 1 day then Prednisone 10 mg daily x 3 day then stop... (Patient not taking: Reported on 03/01/2022) 10 tablet 0 Completed Course   REPATHA SURECLICK [id number] MG/ML SOAJ INJECT 1 ML INTO THE SKIN EVERY 14 (FOURTEEN) DAYS. (Patient not taking: Reported on 03/01/2022) 6 mL 3 Not Taking    Review of Systems  Cardiovascular:  Positive for dyspnea on exertion. Negative for chest pain, leg swelling, palpitations and syncope.  Respiratory:  Positive for shortness of breath.       Physical Exam: Physical Exam Vitals and nursing note reviewed.  Constitutional:      General: She is not in acute distress. Neck:     Vascular: No JVD.  Cardiovascular:     Rate and Rhythm: Normal rate and regular rhythm.     Heart sounds: Normal heart sounds. No murmur heard. Pulmonary:     Effort: Pulmonary effort is normal.     Breath sounds: Normal breath sounds. No wheezing or rales.  Musculoskeletal:     Right lower leg: No edema.     Left lower leg: No edema.        Imaging/tests reviewed and independently interpreted: Lab Results: CBC, BMP, trop, BNP  Cardiac Studies:  Telemetry 03/01/2022: No  significant arrhythmia  EKG 02/28/2022: Sinus rhythm with PAC Nonspecific T wave abnormality  Echocardiogram 03/01/2022:  1. Left ventricular ejection fraction, by estimation, is 55 to 60%. The  left ventricle has normal function. The left ventricle demonstrates  regional wall motion abnormalities (see scoring diagram/findings for  description). The basal-to-mid inferior LV  wall remains hypokinetic (consistent with prior TTE). There is moderate  concentric left ventricular hypertrophy. Diastolic function indeterminant  due to moderate to severe MAC. The average left ventricular global  longitudinal strain is -15.3 %. The  global longitudinal strain is abnormal.   2. Right ventricular systolic function is mildly reduced. The right  ventricular size is normal. There is normal pulmonary artery systolic  pressure. The estimated right ventricular systolic pressure is 7.8 mmHg.   3. Left atrial size was mild to moderately dilated.   4. The mitral valve is degenerative. Mild mitral valve regurgitation.  Moderate to severe mitral annular calcification.   5. The aortic valve has been repaired/replaced. Aortic valve  regurgitation is not visualized. There is a 23 mm Sapien prosthetic (TAVR)  valve present in the aortic position. Echo findings are consistent with  normal structure and function of the aortic  valve prosthesis. Aortic valve mean gradient measures 14.0 mmHg. Aortic  valve Vmax measures 2.52 m/s.   6. Aortic dilatation noted. There is borderline dilatation of the  ascending aorta, measuring 36 mm.   Comparison(s): Compared to prior TTE in 01/2022, the EF appears slightly  more robust at 55-60% (previously 53% by 3D). Otherwise, there is no  significant change.    RHC/LHC 10/2018: RA: 4 mmHg RV: 54/9 mmHg PA: 54/17 mmHg, mean PAP 34 mmhg. PCWP: 25 mmHg with tall V wave   CO: 5.2 L/min CI: 3 L/min/m2   LM: Normal LAD: Normal LCx:: Normal RCA: Ostial calcific at least  60% stenosis. Ostial RPDA 20% stenosis.    Right coronary artery was difficult to engage. I had to use multiple catheters, and was finally able to engage with  [place] catheter. there was dampnening of the pressure with catheter engagement. Ostium is at least 60% stenosed with moderate calcification. There was no signifciant improvement with IC NTG. I did not perform FFR in order to limit contrast use in this patient with Cr of 3.0, not on dialysis.    Recommendation: Will discuss with structural valve clinic. If functional evaluation of RCA is felt necesary prior to TAVR/SAVR, would consider non-invasive stress test.    Assessment & Recommendations:  African American female with hypertension, hyperlipidemia, diabetes, ESRD now on HD 4 times/week since 01/2022, CAD, h/o strokes (2015, 2020), s/p TAVR for severe AS (11/2018), PAD, with shortness of breath, troponin elevation  Shortness of breath, troponin elevation: Recent addition of dialysis, BNP elevation suggestive of congestive heart failure.  Troponin elevation flat, not consistent with ACS.  Historical troponin levels suggest that they have been higher since she has been on dialysis, which could suggest demand ischemia.  She has known CAD, and has possibly had some injury in RCA territory at some point, most abnormalities.  There are no different from recent echocardiograms.  At this time, continue heparin if no contraindication, for 48 hours.  Return to emergency workup at this time.  Continue additional dialysis during this hospitalization.  Patient's acute shortness of breath while I was in the room, was not corroborated by objective findings to suggest hypoxia.  Continue monitoring, consider repeat chest x-ray if shortness of breath does not improve.  Patient also complains of multiple other symptoms, including " I cannot see", " I cannot get words out", " I'm cold".  On my exam, there was no focal neurodeficit.  Also, there was no change from  her baseline vision, which is reduced acuity left eye, but no significant change from baseline.  I corroborated this with husband present in the room.  I updated Dr. Darrick Meigs regarding the above findings.  Will need to continue monitoring.  CAD: Known CAD with moderate ostial RCA disease on pre-TAVR cath in 2020.   See discussion re: troponin elevation as above. Continue aspirin, heparin, statin.  Discussed interpretation of tests and management recommendations with the primary team     Nigel Mormon, MD Pager: [phone] Office: [phone]

## 2022-03-01 NOTE — Progress Notes (Signed)
Pt's chart reviewed. Pt has been receiving HD at TCU at [facility] on [facility]. Contacted TCU and confirmed that pt is supposed to start at [place] on Thursday. Contacted [facility] to inquire about pt's chair time. Will assist as needed.   Melven Sartorius Renal Navigator [phone]

## 2022-03-01 NOTE — Progress Notes (Signed)
[facility]  PROGRESS NOTE  [name] [id number] DOB: Mar 01, 1940 DOA: 02/28/2022 PCP: Audley Hose, MD   Brief HPI:   82 year old female with medical history of ESRD on HD MWF, CAD s/p RCA stent, chronic diastolic heart failure, diabetes mellitus type 2, CAD, CVA, aortic stenosis s/p TAVR in 2020, PVD, OSA not on CPAP presented with shortness of breath after dialysis. Troponin 281, BNP 579.  Chest x-ray showed cardiomegaly but no clear edema.  Cardiology was consulted due to elevated troponin, feels this is from renal failure.  Recommend serial EKGs.  Recommend echo and heparin for 48 hours    Subjective   Patient seen complains of acid reflux.  States she has heartburn.  Denies difficulty in swallowing.  Denies chest pain.  This morning had episode of anxiety/ tachypnea with numbness of both hands and feet.   Assessment/Plan:    Troponin elevation -Initial troponin 281, no chest pain -Likely demand ischemia in setting of renal failure .-She has history of CAD s/p distal RCA stent and TAVR -Last cath in 2020 showed moderate ostial RCA stenosis, no obstructive lesion in the left main artery, LAD and circumflex; distal RCA stent was patent -Cardiology consulted, recommended serial troponin, serial EKG, echo and heparin for 48 hours -Continue Imdur  Diabetes mellitus type 2 -Last hemoglobin A1c was 7.2 -Continue sliding scale insulin with NovoLog -CBG well-controlled  Hypertension -Continue amlodipine, hydralazine, Imdur, Toradol, losartan  ESRD on HD MWF -Nephrology consulted  Chronic diastolic heart failure -Appears euvolemic  Status post TAVR -Secondary to aortic stenosis in 2020  Hyperlipidemia -On Repatha  Anxiety -Had panic attack this morning, resolved -Continue venlafaxine, mirtazapine  Medications     amLODipine  5 mg Oral Daily   aspirin EC  81 mg Oral Daily   brimonidine  1 drop Left Eye Daily   folic acid  1 mg Oral Daily    hydrALAZINE  100 mg Oral TID   insulin aspart  0-9 Units Subcutaneous TID PC & HS   isosorbide mononitrate  240 mg Oral Daily   labetalol  200 mg Oral BID   losartan  25 mg Oral QHS   mirtazapine  7.5 mg Oral QHS   venlafaxine XR  150 mg Oral Daily     Data Reviewed:   CBG:  Recent Labs  Lab 02/28/22 1746 03/01/22 0806  GLUCAP 228* 181*    SpO2: 96 %    Vitals:   03/01/22 0400 03/01/22 0500 03/01/22 0600 03/01/22 0614  BP: (!) 114/35 (!) 125/37 (!) 124/40   Pulse: 66 68 68   Resp: (!) 0 13 17   Temp:    98.6 F (37 C)  TempSrc:    Oral  SpO2: 98% 95% 96%   Weight:      Height:          Data Reviewed:  Basic Metabolic Panel: Recent Labs  Lab 02/28/22 1743  NA 136  K 4.2  CL 96*  CO2 26  GLUCOSE 255*  BUN 12  CREATININE 2.34*  CALCIUM 8.9    CBC: Recent Labs  Lab 02/28/22 1743 03/01/22 0532  WBC 7.9 6.4  NEUTROABS 4.0  --   HGB 14.0 12.7  HCT 41.7 38.7  MCV 88.7 90.6  PLT 259 240    LFT No results for input(s): "AST", "ALT", "ALKPHOS", "BILITOT", "PROT", "ALBUMIN" in the last 168 hours.   Antibiotics: Anti-infectives (From admission, onward)    None  DVT prophylaxis: Heparin  Code Status: Full code  Family Communication: Discussed with patient's husband at bedside   CONSULTS cardiology   Objective    Physical Examination:   General: Appears in no acute distress Cardiovascular: S1-S2, regular, no murmur auscultated Respiratory: Lungs are clear to auscultation bilaterally Abdomen: Abdomen is soft, nontender, no organomegaly Extremities: No edema in the lower extremities Neurologic: Alert, oriented x 3, no focal deficit noted   Status is: Inpatient: Elevated troponin           [place]   [facility] If 7PM-7AM, please contact night-coverage at [email or web address], Office  [phone]   03/01/2022, 8:37 AM  LOS: 0 days            Patient is

## 2022-03-01 NOTE — ED Notes (Signed)
ED TO INPATIENT HANDOFF REPORT    S Name/Age/Gender [name] 82 y.o. female Room/Bed: 046C/046C  Code Status   Code Status: Full Code  Home/SNF/Other Home Patient oriented to: self, place, time, and situation Is this baseline? Yes   Triage Complete: Triage complete  Chief Complaint Elevated troponin [R79.89]  Triage Note Pt c/o SOB that started today after dialysis. Pt goes to dialysis M, Tues, Thurs, Fri. Pt is tachypneic. Pt denies any other complaints.   Allergies No Known Allergies  Level of Care/Admitting Diagnosis ED Disposition     ED Disposition  Admit   Condition  --   Comment  Hospital Area: [facility] [[id number]]  Level of Care: Telemetry Cardiac [103]  May place patient in observation at [facility] or [place] if equivalent level of care is available:: No  Covid Evaluation: Asymptomatic - no recent exposure (last 10 days) testing not required  Diagnosis: Elevated troponin [321909]  Admitting Physician: Orene Desanctis [id number]  Attending Physician: Orene Desanctis [id number]          B Medical/Surgery History Past Medical History:  Diagnosis Date   Arthritis    Cerebrovascular disease    CHF (congestive heart failure) ([place])    CKD (chronic kidney disease)    Sees Dr Hollie Salk   Coronary artery disease    Depression    Diabetes ([place])    INSULIN DEPENDENT   Diabetic peripheral neuropathy ([place])    Diabetic retinopathy ([place])    Diverticulitis    GERD (gastroesophageal reflux disease)    History of CVA (cerebrovascular accident)    x 2 no residulal   Hyperlipidemia    Hypertension    Obesity    Renal lesion    S/P TAVR (transcatheter aortic valve replacement) 11/13/2018   s/p TAVR with a 23m Edwards S3U via the TF approach by Dr. ORoxy Mannsand Dr. MAngelena Form  Severe aortic stenosis    Past Surgical History:  Procedure Laterality Date   ABDOMINAL HYSTERECTOMY     AV FISTULA PLACEMENT Left 12/18/2017   Procedure: ARTERIOVENOUS  (AV) FISTULA CREATION ARM;  Surgeon: CMarty Heck MD;  [facility]  Service: Vascular;  Laterality: Left;   bilateral cataract surgery     CARDIAC CATHETERIZATION  01/07/2014   DR GEinar Gip  COLON RESECTION  02/1999   COLONOSCOPY     COLOSTOMY  02/1999   COLOSTOMY CLOSURE  07/1999   EYE SURGERY Left 2019   FISTULA SUPERFICIALIZATION Left 06/29/2018   Procedure: FISTULA SUPERFICIALIZATION LEFT BRACHIOCEPHALIC;  Surgeon: CMarty Heck MD;  [facility]  Service: Vascular;  Laterality: Left;   LEFT HEART CATHETERIZATION WITH CORONARY ANGIOGRAM N/A 01/07/2014   Procedure: LEFT HEART CATHETERIZATION WITH CORONARY ANGIOGRAM;  Surgeon: JLaverda Page MD;  [facility];  Service: Cardiovascular;  Laterality: N/A;   LOOP RECORDER INSERTION N/A 02/17/2020   Procedure: LOOP RECORDER INSERTION;  Surgeon: KDeboraha Sprang MD;  [facility];  Service: Cardiovascular;  Laterality: N/A;   middle cerebral artery stent placement Right    OTHER SURGICAL HISTORY     laser surgery   PTCA  01/07/2014   DES to RCA    DR GEinar Gip  right knee surgery Right    for infection   RIGHT/LEFT HEART CATH AND CORONARY ANGIOGRAPHY N/A 10/23/2018   Procedure: RIGHT/LEFT HEART CATH AND CORONARY ANGIOGRAPHY;  Surgeon: PNigel Mormon MD;  [facility];  Service:  Cardiovascular;  Laterality: N/A;   TEE WITHOUT CARDIOVERSION N/A 11/13/2018   Procedure: TRANSESOPHAGEAL ECHOCARDIOGRAM (TEE);  Surgeon: Burnell Blanks, MD;  Location: [facility];  Service: Open Heart Surgery;  Laterality: N/A;   TRANSCATHETER AORTIC VALVE REPLACEMENT, TRANSFEMORAL  11/13/2018   TRANSCATHETER AORTIC VALVE REPLACEMENT, TRANSFEMORAL N/A 11/13/2018   Procedure: TRANSCATHETER AORTIC VALVE REPLACEMENT, TRANSFEMORAL;  Surgeon: Burnell Blanks, MD;  Location: [place];  Service: Open Heart Surgery;  Laterality: N/A;     A IV Location/Drains/Wounds Patient Lines/Drains/Airways  Status     Active Line/Drains/Airways     Name Placement date Placement time Site Days   Peripheral IV 02/28/22 20 G Right Antecubital 02/28/22  2054  Antecubital  1   Peripheral IV 02/28/22 20 G Anterior;Right Forearm 02/28/22  2139  Forearm  1   Fistula / Graft Left Upper arm Arteriovenous fistula --  --  Upper arm  --   Wound / Incision (Open or Dehisced) 01/18/22 Other (Comment);Skin tear Nose Medial pt pulled BiPAP masked off face. Open area present on bridge of nose. Gauze and Foam applied.Dialysis RN present states that she just got the mask down off her face. 01/18/22  0620  Nose  42            Intake/Output Last 24 hours No intake or output data in the 24 hours ending 03/01/22 1131  Labs/Imaging Results for orders placed or performed during the hospital encounter of 02/28/22 (from the past 48 hour(s))  Basic metabolic panel     Status: Abnormal   Collection Time: 02/28/22  5:43 PM  Result Value Ref Range   Sodium 136 135 - 145 mmol/L   Potassium 4.2 3.5 - 5.1 mmol/L   Chloride 96 (L) 98 - 111 mmol/L   CO2 26 22 - 32 mmol/L   Glucose, Bld 255 (H) 70 - 99 mg/dL    Comment: Glucose reference range applies only to samples taken after fasting for at least 8 hours.   BUN 12 8 - 23 mg/dL   Creatinine, Ser 2.34 (H) 0.44 - 1.00 mg/dL   Calcium 8.9 8.9 - 10.3 mg/dL   GFR, Estimated 20 (L) >60 mL/min    Comment: (NOTE) Calculated using the CKD-EPI Creatinine Equation (2021)    Anion gap 14 5 - 15    Comment: Performed at [facility] [address] [place], [place] [address]  Troponin I (High Sensitivity)     Status: Abnormal   Collection Time: 02/28/22  5:43 PM  Result Value Ref Range   Troponin I (High Sensitivity) 281 (HH) <18 ng/L    Comment: CRITICAL RESULT CALLED TO, READ BACK BY AND VERIFIED WITH E.ANELLO,RN @1902$  02/28/2022 VANG.J (NOTE) Elevated high sensitivity troponin I (hsTnI) values and significant  changes across serial measurements may suggest ACS  but many other  chronic and acute conditions are known to elevate hsTnI results.  Refer to the "Links" section for chest pain algorithms and additional  guidance. Performed at [facility], [place] [address], [place], [place] [address]   Brain natriuretic peptide     Status: Abnormal   Collection Time: 02/28/22  5:43 PM  Result Value Ref Range   B Natriuretic Peptide 579.2 (H) 0.0 - 100.0 pg/mL    Comment: Performed at [facility] [address] [place], [place] [address]  CBC with Differential     Status: Abnormal   Collection Time: 02/28/22  5:43 PM  Result Value Ref Range  WBC 7.9 4.0 - 10.5 K/uL   RBC 4.70 3.87 - 5.11 MIL/uL   Hemoglobin 14.0 12.0 - 15.0 g/dL   HCT 41.7 36.0 - 46.0 %   MCV 88.7 80.0 - 100.0 fL   MCH 29.8 26.0 - 34.0 pg   MCHC 33.6 30.0 - 36.0 g/dL   RDW 17.1 (H) 11.5 - 15.5 %   Platelets 259 150 - 400 K/uL   nRBC 0.0 0.0 - 0.2 %   Neutrophils Relative % 50 %   Neutro Abs 4.0 1.7 - 7.7 K/uL   Lymphocytes Relative 40 %   Lymphs Abs 3.1 0.7 - 4.0 K/uL   Monocytes Relative 8 %   Monocytes Absolute 0.6 0.1 - 1.0 K/uL   Eosinophils Relative 1 %   Eosinophils Absolute 0.1 0.0 - 0.5 K/uL   Basophils Relative 1 %   Basophils Absolute 0.1 0.0 - 0.1 K/uL   Immature Granulocytes 0 %   Abs Immature Granulocytes 0.02 0.00 - 0.07 K/uL    Comment: Performed at [facility] [address] [place], [place] [address]  POC CBG, ED     Status: Abnormal   Collection Time: 02/28/22  5:46 PM  Result Value Ref Range   Glucose-Capillary 228 (H) 70 - 99 mg/dL    Comment: Glucose reference range applies only to samples taken after fasting for at least 8 hours.  Troponin I (High Sensitivity)     Status: Abnormal   Collection Time: 02/28/22  8:04 PM  Result Value Ref Range   Troponin I (High Sensitivity) 235 (HH) <18 ng/L    Comment: CRITICAL VALUE NOTED. VALUE IS CONSISTENT WITH PREVIOUSLY REPORTED/CALLED VALUE (NOTE) Elevated high sensitivity troponin I  (hsTnI) values and significant  changes across serial measurements may suggest ACS but many other  chronic and acute conditions are known to elevate hsTnI results.  Refer to the "Links" section for chest pain algorithms and additional  guidance. Performed at [facility], [place] [address] [place], [place] [address]   Troponin I (High Sensitivity)     Status: Abnormal   Collection Time: 02/28/22 11:20 PM  Result Value Ref Range   Troponin I (High Sensitivity) 264 (HH) <18 ng/L    Comment: CRITICAL VALUE NOTED. VALUE IS CONSISTENT WITH PREVIOUSLY REPORTED/CALLED VALUE (NOTE) Elevated high sensitivity troponin I (hsTnI) values and significant  changes across serial measurements may suggest ACS but many other  chronic and acute conditions are known to elevate hsTnI results.  Refer to the "Links" section for chest pain algorithms and additional  guidance. Performed at [facility], [place] [address], [place], [place] [address]   Heparin level (unfractionated)     Status: None   Collection Time: 03/01/22  5:32 AM  Result Value Ref Range   Heparin Unfractionated 0.53 0.30 - 0.70 IU/mL    Comment: (NOTE) The clinical reportable range upper limit is being lowered to >1.10 to align with the FDA approved guidance for the current laboratory assay.  If heparin results are below expected values, and patient dosage has  been confirmed, suggest follow up testing of antithrombin III levels. Performed at [facility], [place] [address], [place] [address]   CBC     Status: Abnormal   Collection Time: 03/01/22  5:32 AM  Result Value Ref Range   WBC 6.4 4.0 - 10.5 K/uL   RBC 4.27 3.87 - 5.11 MIL/uL   Hemoglobin 12.7 12.0 - 15.0 g/dL   HCT  38.7 36.0 - 46.0 %   MCV 90.6 80.0 - 100.0 fL   MCH 29.7 26.0 - 34.0 pg   MCHC 32.8 30.0 - 36.0 g/dL   RDW 17.5 (H) 11.5 - 15.5 %   Platelets 240 150 - 400 K/uL   nRBC 0.0 0.0 - 0.2 %    Comment: Performed at [facility] [address] [place], [place] [address]  Troponin I (High Sensitivity)     Status: Abnormal   Collection Time: 03/01/22  5:32 AM  Result Value Ref Range   Troponin I (High Sensitivity) 225 (HH) <18 ng/L    Comment: CRITICAL VALUE NOTED. VALUE IS CONSISTENT WITH PREVIOUSLY REPORTED/CALLED VALUE (NOTE) Elevated high sensitivity troponin I (hsTnI) values and significant  changes across serial measurements may suggest ACS but many other  chronic and acute conditions are known to elevate hsTnI results.  Refer to the "Links" section for chest pain algorithms and additional  guidance. Performed at [facility], [place] [address] [place], [place] [address]   CBG monitoring, ED     Status: Abnormal   Collection Time: 03/01/22  8:06 AM  Result Value Ref Range   Glucose-Capillary 181 (H) 70 - 99 mg/dL    Comment: Glucose reference range applies only to samples taken after fasting for at least 8 hours.  CBG monitoring, ED     Status: Abnormal   Collection Time: 03/01/22 10:01 AM  Result Value Ref Range   Glucose-Capillary 152 (H) 70 - 99 mg/dL    Comment: Glucose reference range applies only to samples taken after fasting for at least 8 hours.   DG Chest 2 View  Result Date: 02/28/2022 CLINICAL DATA:  Shortness of breath EXAM: CHEST - 2 VIEW COMPARISON:  01/22/2022 FINDINGS: The heart size and mediastinal contours are within normal limits. Cardiomegaly. Implantable loop recorder. Disc degenerative disease of the thoracic spine. IMPRESSION: Cardiomegaly without acute abnormality of the lungs. Electronically Signed   By: Delanna Ahmadi [name]   On: 02/28/2022 19:18    Pending Labs Unresulted Labs (From admission, onward)     Start     Ordered   03/01/22 1400  Heparin level (unfractionated)  Once-Timed,   TIMED        03/01/22 0651   03/01/22 1111  Hepatitis B surface antigen  (New Admission Hemo Labs (Hepatitis B))  Once,   R        03/01/22 1112   03/01/22 1111  Hepatitis B surface  antibody,quantitative  (New Admission Hemo Labs (Hepatitis B))  Once,   R        03/01/22 1112   03/01/22 0500  Heparin level (unfractionated)  Daily,   R     See Hyperspace for full Linked Orders Report.   02/28/22 2100   03/01/22 0500  CBC  Daily,   R     See Hyperspace for full Linked Orders Report.   02/28/22 2100   Signed and Held  Renal function panel  Once,   R        Signed and Held            Vitals/Pain Today's Vitals   03/01/22 0930 03/01/22 0945 03/01/22 1000 03/01/22 1021  BP: (!) 162/55  (!) 156/43   Pulse: 66 66    Resp: 16 18 14   $ Temp:    97.8 F (36.6 C)  TempSrc:    Oral  SpO2: 98% 100%    Weight:  Height:      PainSc:        Isolation Precautions No active isolations  Medications Medications  acetaminophen (TYLENOL) tablet 650 mg (has no administration in time range)  ondansetron (ZOFRAN) injection 4 mg (has no administration in time range)  heparin ADULT infusion 100 units/mL (25000 units/29m) (750 Units/hr Intravenous Rate/Dose Verify 03/01/22 0524)  aspirin EC tablet 81 mg (81 mg Oral Given 03/01/22 1005)  amLODipine (NORVASC) tablet 5 mg (5 mg Oral Given 03/01/22 1004)  hydrALAZINE (APRESOLINE) tablet 100 mg (100 mg Oral Given 03/01/22 1004)  labetalol (NORMODYNE) tablet 200 mg (200 mg Oral Given 03/01/22 1004)  isosorbide mononitrate (IMDUR) 24 hr tablet 240 mg (240 mg Oral Given 03/01/22 1005)  losartan (COZAAR) tablet 25 mg (25 mg Oral Given 02/28/22 2228)  mirtazapine (REMERON) tablet 7.5 mg (7.5 mg Oral Given 02/28/22 2228)  venlafaxine XR (EFFEXOR-XR) 24 hr capsule 150 mg (150 mg Oral Given [id number]  folic acid (FOLVITE) tablet 1 mg (1 mg Oral Given 03/01/22 1005)  brimonidine (ALPHAGAN) 0.2 % ophthalmic solution 1 drop (has no administration in time range)  insulin aspart (novoLOG) injection 0-9 Units (2 Units Subcutaneous Given 03/01/22 1008)  Chlorhexidine Gluconate Cloth 2 % PADS 6 each (has no administration in time range)   heparin bolus via infusion 3,800 Units (3,800 Units Intravenous Bolus from Bag 02/28/22 2124)    Mobility walks with device     Focused Assessments Pulmonary Assessment Handoff:  Lung sounds:   O2 Device: Room Air      R Recommendations: See Admitting Provider Note  Report given to:

## 2022-03-01 NOTE — Progress Notes (Signed)
ANTICOAGULATION CONSULT NOTE   Pharmacy Consult for heparin Indication: chest pain/ACS  No Known Allergies  Patient Measurements: Height: 5' 2"$  (157.5 cm) Weight: 65.8 kg (145 lb 1 oz) IBW/kg (Calculated) : 50.1 Heparin Dosing Weight: 63kg  Vital Signs: Temp: 97.8 F (36.6 C) (02/13 1325) Temp Source: Axillary (02/13 1325) BP: 113/58 (02/13 1325) Pulse Rate: 79 (02/13 1325)  Labs: Recent Labs    02/28/22 1743 02/28/22 2004 02/28/22 2320 03/01/22 0532 03/01/22 1359  HGB 14.0  --   --  12.7  --   HCT 41.7  --   --  38.7  --   PLT 259  --   --  240  --   HEPARINUNFRC  --   --   --  0.53 0.44  CREATININE 2.34*  --   --   --   --   TROPONINIHS 281* 235* 264* 225*  --      Estimated Creatinine Clearance: 16.2 mL/min (A) (by C-G formula based on SCr of 2.34 mg/dL (H)).  Assessment: 64 YOF presenting with SOB, concern for ACS, she is not on anticoagulation PTA Heparin level therapeutic   Goal of Therapy:  Heparin level 0.3-0.7 units/ml Monitor platelets by anticoagulation protocol: Yes   Plan:  Cont heparin 750 units/hr Follow up AM labs  Thank you Anette Guarneri, PharmD

## 2022-03-01 NOTE — Consult Note (Signed)
Reason for Consult: Continuity of ESRD care Referring Physician: Eleonore Chiquito MD [facility])  HPI:  82 year old woman with past medical history significant for hypertension, aortic stenosis status post TAVR, coronary artery disease, history of CVA, insulin-dependent diabetes mellitus complicated by retinopathy and neuropathy as well as end-stage renal disease on hemodialysis.  She presented to the emergency room yesterday with nausea, feeling poorly and some increasing shortness of breath after dialysis earlier in the day.  She denied any chest pain/chest tightness, diaphoresis, vomiting, fever, chills or cough/sputum production.  She is being admitted to evaluate/rule out acute coronary syndrome and optimize her respiratory status.  Chest x-ray showed cardiomegaly but without any acute lung process.  Hemodialysis prescription: In center hemodialysis at the [facility] transitional care unit (accepted at [facility] and scheduled to start 2/15).  4 times a week, 180 dialyzer, BFR 400/DFR 600, 3K/2.0 calcium, EDW 57 kg, 3 hours.  Left brachiobasilic fistula.  Past Medical History:  Diagnosis Date   Arthritis    Cerebrovascular disease    CHF (congestive heart failure) ([place])    CKD (chronic kidney disease)    Sees Dr Hollie Salk   Coronary artery disease    Depression    Diabetes ([place])    INSULIN DEPENDENT   Diabetic peripheral neuropathy (HCC)    Diabetic retinopathy ([place])    Diverticulitis    GERD (gastroesophageal reflux disease)    History of CVA (cerebrovascular accident)    x 2 no residulal   Hyperlipidemia    Hypertension    Obesity    Renal lesion    S/P TAVR (transcatheter aortic valve replacement) 11/13/2018   s/p TAVR with a 65m Edwards S3U via the TF approach by Dr. ORoxy Mannsand Dr. MAngelena Form  Severe aortic stenosis     Past Surgical History:  Procedure Laterality Date   ABDOMINAL HYSTERECTOMY     AV FISTULA PLACEMENT Left 12/18/2017   Procedure: ARTERIOVENOUS (AV) FISTULA  CREATION ARM;  Surgeon: CMarty Heck MD;  [facility]  Service: Vascular;  Laterality: Left;   bilateral cataract surgery     CARDIAC CATHETERIZATION  01/07/2014   DR GEinar Gip  COLON RESECTION  02/1999   COLONOSCOPY     COLOSTOMY  02/1999   COLOSTOMY CLOSURE  07/1999   EYE SURGERY Left 2019   FISTULA SUPERFICIALIZATION Left 06/29/2018   Procedure: FISTULA SUPERFICIALIZATION LEFT BRACHIOCEPHALIC;  Surgeon: CMarty Heck MD;  [place]  Service: Vascular;  Laterality: Left;   LEFT HEART CATHETERIZATION WITH CORONARY ANGIOGRAM N/A 01/07/2014   Procedure: LEFT HEART CATHETERIZATION WITH CORONARY ANGIOGRAM;  Surgeon: JLaverda Page MD;  [facility];  Service: Cardiovascular;  Laterality: N/A;   LOOP RECORDER INSERTION N/A 02/17/2020   Procedure: LOOP RECORDER INSERTION;  Surgeon: KDeboraha Sprang MD;  [facility];  Service: Cardiovascular;  Laterality: N/A;   middle cerebral artery stent placement Right    OTHER SURGICAL HISTORY     laser surgery   PTCA  01/07/2014   DES to RCA    DR GEinar Gip  right knee surgery Right    for infection   RIGHT/LEFT HEART CATH AND CORONARY ANGIOGRAPHY N/A 10/23/2018   Procedure: RIGHT/LEFT HEART CATH AND CORONARY ANGIOGRAPHY;  Surgeon: PNigel Mormon MD;  [facility];  Service: Cardiovascular;  Laterality: N/A;   TEE WITHOUT CARDIOVERSION N/A 11/13/2018   Procedure: TRANSESOPHAGEAL ECHOCARDIOGRAM (TEE);  Surgeon: MBurnell Blanks MD;  [place] [facility]  Service: Open Heart Surgery;  Laterality: N/A;   TRANSCATHETER AORTIC VALVE REPLACEMENT, TRANSFEMORAL  11/13/2018   TRANSCATHETER AORTIC VALVE REPLACEMENT, TRANSFEMORAL N/A 11/13/2018   Procedure: TRANSCATHETER AORTIC VALVE REPLACEMENT, TRANSFEMORAL;  Surgeon: Burnell Blanks, MD;  Location: [place];  Service: Open Heart Surgery;  Laterality: N/A;    Family History  Problem Relation Age of Onset   Diabetes Mother     Heart disease Mother    Prostate cancer Father    Hypertension Brother    Prostate cancer Brother     Social History:  reports that she has never smoked. She has never used smokeless tobacco. She reports that she does not drink alcohol and does not use drugs.  Allergies: No Known Allergies  Medications: I have reviewed the patient's current medications. Scheduled:  amLODipine  5 mg Oral Daily   aspirin EC  81 mg Oral Daily   brimonidine  1 drop Left Eye Daily   folic acid  1 mg Oral Daily   hydrALAZINE  100 mg Oral TID   insulin aspart  0-9 Units Subcutaneous TID PC & HS   isosorbide mononitrate  240 mg Oral Daily   labetalol  200 mg Oral BID   losartan  25 mg Oral QHS   mirtazapine  7.5 mg Oral QHS   venlafaxine XR  150 mg Oral Daily   Continuous:  heparin 750 Units/hr (03/01/22 0524)       Latest Ref Rng & Units 02/28/2022    5:43 PM 02/21/2022   12:52 PM 02/21/2022   12:50 PM  BMP  Glucose 70 - 99 mg/dL 255  318  327   BUN 8 - 23 mg/dL 12  30  29   $ Creatinine 0.44 - 1.00 mg/dL 2.34  2.60  2.54   Sodium 135 - 145 mmol/L 136  138  136   Potassium 3.5 - 5.1 mmol/L 4.2  4.0  4.1   Chloride 98 - 111 mmol/L 96  98  97   CO2 22 - 32 mmol/L 26   26   Calcium 8.9 - 10.3 mg/dL 8.9   8.9       Latest Ref Rng & Units 03/01/2022    5:32 AM 02/28/2022    5:43 PM 02/21/2022   12:52 PM  CBC  WBC 4.0 - 10.5 K/uL 6.4  7.9    Hemoglobin 12.0 - 15.0 g/dL 12.7  14.0  15.6   Hematocrit 36.0 - 46.0 % 38.7  41.7  46.0   Platelets 150 - 400 K/uL 240  259      DG Chest 2 View  Result Date: 02/28/2022 CLINICAL DATA:  Shortness of breath EXAM: CHEST - 2 VIEW COMPARISON:  01/22/2022 FINDINGS: The heart size and mediastinal contours are within normal limits. Cardiomegaly. Implantable loop recorder. Disc degenerative disease of the thoracic spine. IMPRESSION: Cardiomegaly without acute abnormality of the lungs. Electronically Signed   By: Delanna Ahmadi [name]   On: 02/28/2022 19:18    Review of  Systems  Constitutional:  Negative for appetite change, chills, fatigue and fever.  HENT:  Negative for hearing loss, nosebleeds, sore throat and trouble swallowing.   Eyes:  Negative for photophobia and visual disturbance.  Respiratory:  Positive for shortness of breath. Negative for cough, chest tightness and wheezing.   Cardiovascular:  Negative for chest pain and leg swelling.  Gastrointestinal:  Negative for abdominal pain, diarrhea, nausea and vomiting.  Endocrine: Negative for polydipsia and polyuria.  Genitourinary:  Negative for difficulty  urinating, dysuria, flank pain and frequency.  Musculoskeletal:  Negative for arthralgias, gait problem and myalgias.  Skin:  Negative for pallor and wound.  Neurological:  Negative for dizziness, tremors and headaches.   Blood pressure (!) 156/43, pulse 66, temperature 97.8 F (36.6 C), temperature source Oral, resp. rate 14, height 5' 2"$  (1.575 m), weight 65.8 kg, SpO2 100 %. Physical Exam Vitals reviewed.  Constitutional:      General: She is not in acute distress.    Appearance: She is well-developed and normal weight. She is not ill-appearing or toxic-appearing.  HENT:     Head: Normocephalic and atraumatic.     Mouth/Throat:     Mouth: Mucous membranes are moist.     Pharynx: Oropharynx is clear.  Eyes:     Extraocular Movements: Extraocular movements intact.     Pupils: Pupils are equal, round, and reactive to light.  Neck:     Vascular: No JVD.  Cardiovascular:     Rate and Rhythm: Normal rate and regular rhythm.     Pulses: Normal pulses.     Heart sounds: Normal heart sounds.  Pulmonary:     Effort: Pulmonary effort is normal. No accessory muscle usage.     Breath sounds: Normal breath sounds.  Abdominal:     General: Bowel sounds are normal.     Palpations: Abdomen is soft. There is no mass.  Musculoskeletal:        General: Normal range of motion.     Cervical back: Normal range of motion and neck supple.     Right  lower leg: No edema.     Left lower leg: No edema.     Comments: Left brachiobasilic fistula that is pulsatile  Skin:    General: Skin is warm and dry.     Coloration: Skin is not pale.  Neurological:     General: No focal deficit present.     Mental Status: She is alert and oriented to person, place, and time.    Assessment/Plan: 1.  Worsening shortness of breath: In the setting of an elevated troponin raising concern for acute coronary syndrome.  Recommendations noted by cardiology to cycle cardiac enzymes while placing the patient on a heparin drip.  Echocardiogram performed earlier this morning. 2.  End-stage renal disease: I will order for a shortened hemodialysis treatment tomorrow for clearance/efforts at ultrafiltration following which she can resume her outpatient TTS dialysis schedule on which [facility].  She will need recheck of her access flow at the outpatient dialysis unit +/- fistulogram based on its trend. 3.  Hypertension: Blood pressure marginally elevated on multiple agents including low-dose losartan 25 mg daily and amlodipine 5 mg daily.  Continue to follow with hemodialysis/UF. 4.  Anemia of chronic disease: Hemoglobin and hematocrit are currently at goal and without indications for ESA. 5.  Secondary hyperparathyroidism: Calcium and phosphorus levels are normal/at goal without phosphorus binders.  Continue renal diet.  Marnette Perkins K. 03/01/2022, 10:46 AM

## 2022-03-01 NOTE — Progress Notes (Signed)
ANTICOAGULATION CONSULT NOTE   Pharmacy Consult for heparin Indication: chest pain/ACS  No Known Allergies  Patient Measurements: Height: 5' 2"$  (157.5 cm) Weight: 65.8 kg (145 lb 1 oz) IBW/kg (Calculated) : 50.1 Heparin Dosing Weight: 63kg  Vital Signs: Temp: 98.6 F (37 C) (02/13 0614) Temp Source: Oral (02/13 0614) BP: 124/40 (02/13 0600) Pulse Rate: 68 (02/13 0600)  Labs: Recent Labs    02/28/22 1743 02/28/22 2004 02/28/22 2320 03/01/22 0532  HGB 14.0  --   --  12.7  HCT 41.7  --   --  38.7  PLT 259  --   --  240  HEPARINUNFRC  --   --   --  0.53  CREATININE 2.34*  --   --   --   TROPONINIHS 281* 235* 264* 225*     Estimated Creatinine Clearance: 16.2 mL/min (A) (by C-G formula based on SCr of 2.34 mg/dL (H)).   Medical History: Past Medical History:  Diagnosis Date   Arthritis    Cerebrovascular disease    CHF (congestive heart failure) ([place])    CKD (chronic kidney disease)    Sees Dr Hollie Salk   Coronary artery disease    Depression    Diabetes ([place])    INSULIN DEPENDENT   Diabetic peripheral neuropathy (HCC)    Diabetic retinopathy ([place])    Diverticulitis    GERD (gastroesophageal reflux disease)    History of CVA (cerebrovascular accident)    x 2 no residulal   Hyperlipidemia    Hypertension    Obesity    Renal lesion    S/P TAVR (transcatheter aortic valve replacement) 11/13/2018   s/p TAVR with a 80m Edwards S3U via the TF approach by Dr. ORoxy Mannsand Dr. MAngelena Form  Severe aortic stenosis     Assessment: [id number] presenting with SOB, concern for ACS, she is not on anticoagulation PTA  2/13 AM update:  Heparin level therapeutic Trop 281>264>225  Goal of Therapy:  Heparin level 0.3-0.7 units/ml Monitor platelets by anticoagulation protocol: Yes   Plan:  Cont heparin 750 units/hr 1400 heparin level  JNarda Bonds PharmD, BCPS Clinical Pharmacist Phone: [phone]

## 2022-03-01 NOTE — ED Notes (Signed)
Provider at bedside

## 2022-03-02 ENCOUNTER — Other Ambulatory Visit: Payer: Self-pay | Admitting: Cardiology

## 2022-03-02 DIAGNOSIS — R7989 Other specified abnormal findings of blood chemistry: Secondary | ICD-10-CM | POA: Diagnosis not present

## 2022-03-02 DIAGNOSIS — I10 Essential (primary) hypertension: Secondary | ICD-10-CM | POA: Diagnosis not present

## 2022-03-02 DIAGNOSIS — E1122 Type 2 diabetes mellitus with diabetic chronic kidney disease: Secondary | ICD-10-CM | POA: Diagnosis not present

## 2022-03-02 DIAGNOSIS — I5032 Chronic diastolic (congestive) heart failure: Secondary | ICD-10-CM | POA: Diagnosis not present

## 2022-03-02 DIAGNOSIS — I2510 Atherosclerotic heart disease of native coronary artery without angina pectoris: Secondary | ICD-10-CM

## 2022-03-02 LAB — RENAL FUNCTION PANEL
Albumin: 2.4 g/dL — ABNORMAL LOW (ref 3.5–5.0)
Anion gap: 10 (ref 5–15)
BUN: 22 mg/dL (ref 8–23)
CO2: 26 mmol/L (ref 22–32)
Calcium: 8.3 mg/dL — ABNORMAL LOW (ref 8.9–10.3)
Chloride: 99 mmol/L (ref 98–111)
Creatinine, Ser: 3.19 mg/dL — ABNORMAL HIGH (ref 0.44–1.00)
GFR, Estimated: 14 mL/min — ABNORMAL LOW (ref >60)
Glucose, Bld: 216 mg/dL — ABNORMAL HIGH (ref 70–99)
Phosphorus: 3.6 mg/dL (ref 2.5–4.6)
Potassium: 4.2 mmol/L (ref 3.5–5.1)
Sodium: 135 mmol/L (ref 135–145)

## 2022-03-02 LAB — CBC
HCT: 37.4 % (ref 36.0–46.0)
Hemoglobin: 12.1 g/dL (ref 12.0–15.0)
MCH: 29.7 pg (ref 26.0–34.0)
MCHC: 32.4 g/dL (ref 30.0–36.0)
MCV: 91.7 fL (ref 80.0–100.0)
Platelets: 225 10*3/uL (ref 150–400)
RBC: 4.08 MIL/uL (ref 3.87–5.11)
RDW: 17.8 % — ABNORMAL HIGH (ref 11.5–15.5)
WBC: 6.1 10*3/uL (ref 4.0–10.5)
nRBC: 0 % (ref 0.0–0.2)

## 2022-03-02 LAB — HEPATITIS B SURFACE ANTIBODY, QUANTITATIVE: Hep B S AB Quant (Post): <3.1 m[IU]/mL — ABNORMAL LOW (ref >9.9)

## 2022-03-02 LAB — GLUCOSE, CAPILLARY
Glucose-Capillary: 163 mg/dL — ABNORMAL HIGH (ref 70–99)
Glucose-Capillary: 131 mg/dL — ABNORMAL HIGH (ref 70–99)

## 2022-03-02 LAB — HEPARIN LEVEL (UNFRACTIONATED): Heparin Unfractionated: 0.42 IU/mL (ref 0.30–0.70)

## 2022-03-02 MED ORDER — LIDOCAINE-PRILOCAINE 2.5-2.5 % EX CREA: 1.0000 | TOPICAL_CREAM | CUTANEOUS | Status: DC | PRN

## 2022-03-02 MED ORDER — PANTOPRAZOLE SODIUM 40 MG PO TBEC: 40.0000 mg | DELAYED_RELEASE_TABLET | Freq: Every day | ORAL | 1 refills | Status: AC

## 2022-03-02 MED ORDER — PENTAFLUOROPROP-TETRAFLUOROETH EX AERO: 1.0000 | INHALATION_SPRAY | CUTANEOUS | Status: DC | PRN

## 2022-03-02 MED ORDER — LIDOCAINE HCL (PF) 1 % IJ SOLN: 5.0000 mL | INTRAMUSCULAR | Status: DC | PRN

## 2022-03-02 NOTE — Procedures (Signed)
Patient seen on Hemodialysis. BP (!) 162/38   Pulse (!) 50   Temp 97.7 F (36.5 C) (Oral)   Resp 16   Ht 5' 2"$  (1.575 m)   Wt 68.8 kg   SpO2 100%   BMI 27.74 kg/m   QB 350, UF goal 0.5L Tolerating treatment without complaints at this time.   Elmarie Shiley MD [facility]. Office # [phone] Pager # [phone] 1:51 PM

## 2022-03-02 NOTE — Discharge Summary (Signed)
Physician Discharge Summary   Patient: [name] MRN: [id number] DOB: 1940-10-14  Admit date:     02/28/2022  Discharge date: 03/02/22  Discharge Physician: [name]   PCP: Audley Hose, MD   Recommendations at discharge:      Discharge Diagnoses: Principal Problem:   Elevated troponin Active Problems:   DM (diabetes mellitus), type 2 with renal complications ([place])   Essential hypertension   Hyperlipidemia   S/P TAVR (transcatheter aortic valve replacement)   Chronic heart failure with preserved ejection fraction (HCC)   ESRD on dialysis [facility])  Resolved Problems:   * No resolved hospital problems. *  Hospital Course:  82 year old female with medical history of ESRD on HD MWF, CAD s/p RCA stent, chronic diastolic heart failure, diabetes mellitus type 2, CAD, CVA, aortic stenosis s/p TAVR in 2020, PVD, OSA not on CPAP presented with shortness of breath after dialysis. Troponin 281, BNP 579.  Chest x-ray showed cardiomegaly but no clear edema.  Cardiology was consulted due to elevated troponin, feels this is from renal failure.  Recommend serial EKGs.  Recommend echo and heparin for 48 hours  Assessment and Plan:   Troponin elevation -Initial troponin 281, no chest pain -Likely demand ischemia in setting of renal failure .-She has history of CAD s/p distal RCA stent and TAVR -Last cath in 2020 showed moderate ostial RCA stenosis, no obstructive lesion in the left main artery, LAD and circumflex; distal RCA stent was patent -Cardiology consulted, recommended serial troponin, serial EKG, echo and heparin for 48 hours -Echocardiogram showed EF 55 to 60% with normal left ventricular function.  Basal to mid inferior LV wall remains hypokinetic. -Cardiology recommends to stop heparin and discharged home.  They will arrange stress testing as outpatient.   Diabetes mellitus type 2 -Last hemoglobin A1c was 7.2 -Continue home regimen   Hypertension -Continue  amlodipine, hydralazine, Imdur, Toradol, losartan   ESRD on HD MWF -Nephrology consulted -Underwent hemodialysis on the hospital   Chronic diastolic heart failure -Appears euvolemic   Status post TAVR -Secondary to aortic stenosis in 2020   Hyperlipidemia -On Repatha   Anxiety disorder -Continue venlafaxine         Consultants:: Nephrology, cardiology Procedures performed:  Disposition: Home Diet recommendation:  Discharge Diet Orders (From admission, onward)     Start     Ordered   03/02/22 0000  Diet - low sodium heart healthy        03/02/22 1447           Carb modified diet DISCHARGE MEDICATION: Allergies as of 03/02/2022   No Known Allergies      Medication List     STOP taking these medications    predniSONE 10 MG tablet Commonly known as: DELTASONE       TAKE these medications    Acetaminophen 500 MG capsule Take 500 mg by mouth every 6 (six) hours as needed for moderate pain or headache.   albuterol 108 (90 Base) MCG/ACT inhaler Commonly known as: VENTOLIN HFA Inhale 2 puffs into the lungs every 6 (six) hours as needed for wheezing or shortness of breath.   allopurinol 100 MG tablet Commonly known as: ZYLOPRIM Take 50 mg by mouth daily.   amLODipine 5 MG tablet Commonly known as: NORVASC Take 1 tablet (5 mg total) by mouth daily.   aspirin EC 81 MG tablet Take 81 mg by mouth daily.   brimonidine 0.2 % ophthalmic solution Commonly known as: ALPHAGAN Place 1 drop  into the left eye daily.   folic acid 1 MG tablet Commonly known as: FOLVITE Take 1 mg by mouth daily.   HumaLOG KwikPen 100 UNIT/ML KwikPen Generic drug: insulin lispro Inject 1-5 Units into the skin See admin instructions. Inject 1-5 units 3 times daily with meals and at bedtime. Dose depends on what pt is eating.   hydrALAZINE 100 MG tablet Commonly known as: APRESOLINE Take 100 mg by mouth 3 (three) times daily.   Insulin Degludec 100 UNIT/ML Soln Commonly  known as: [place] Inject 10 Units into the skin at bedtime.   isosorbide mononitrate 120 MG 24 hr tablet Commonly known as: IMDUR Take 2 tablets (240 mg total) by mouth daily.   labetalol 200 MG tablet Commonly known as: NORMODYNE TAKE 1 TABLET BY MOUTH TWICE A DAY   losartan 25 MG tablet Commonly known as: COZAAR Take 1 tablet (25 mg total) by mouth at bedtime.   pantoprazole 40 MG tablet Commonly known as: PROTONIX Take 1 tablet (40 mg total) by mouth daily at 12 noon.   Repatha SureClick [id number] MG/ML Soaj Generic drug: Evolocumab INJECT 1 ML INTO THE SKIN EVERY 14 (FOURTEEN) DAYS.   venlafaxine XR 150 MG 24 hr capsule Commonly known as: EFFEXOR-XR Take 1 capsule (150 mg total) by mouth daily.        Follow-up Information     Bakare, Mobolaji B, MD Follow up in 1 week(s).   Specialty: Internal Medicine Contact information: [place] [address] [phone]         Nigel Mormon, MD. Schedule an appointment as soon as possible for a visit in 1 week(s).   Specialties: Cardiology, Radiology Contact information: [place] [address] [phone]                Discharge Exam: Danley Danker Weights   02/28/22 1730 03/02/22 1046 03/02/22 1423  Weight: 65.8 kg 68.8 kg 68.3 kg   Appears in no acute distress S1-S2, regular, no murmur auscultated Lungs clear to auscultation bilaterally Abdomen is soft, nontender, no organomegaly  Condition at discharge: good  The results of significant diagnostics from this hospitalization (including imaging, microbiology, ancillary and laboratory) are listed below for reference.   Imaging Studies: DG Chest 1 View  Result Date: 03/01/2022 CLINICAL DATA:  Shortness of breath EXAM: CHEST  1 VIEW COMPARISON:  02/28/2022 FINDINGS: Numerous leads and wires project over the chest. Mild right hemidiaphragm elevation. Loop recorder. TAVR. Mild cardiomegaly. No  pleural effusion or pneumothorax. No congestive failure. Clear lungs. IMPRESSION: Cardiomegaly without congestive failure. Electronically Signed   By: Abigail Miyamoto [name]   On: 03/01/2022 13:04   ECHOCARDIOGRAM COMPLETE  Result Date: 03/01/2022    ECHOCARDIOGRAM REPORT   Patient Name:   [name] Date of Exam: 03/01/2022 Medical Rec #:  [id number]     Height:       62.0 in Accession #:    [id number]    Weight:       145.1 lb Date of Birth:  Apr 15, 1940     BSA:          1.668 m Patient Age:    78 years      BP:           124/40 mmHg Patient Gender: F             HR:           68 bpm. Exam Location:  Inpatient Procedure: 2D Echo, Cardiac Doppler, Color Doppler and Strain Analysis Indications:    Elevated Troponin  History:        Patient has prior history of Echocardiogram examinations, most                 recent 01/18/2022. CHF, CAD, Stroke, Signs/Symptoms:Shortness of                 Breath; Risk Factors:Hypertension, Diabetes and Dyslipidemia.                 ESRD.                 Aortic Valve: 23 mm Sapien prosthetic, stented (TAVR) valve is                 present in the aortic position.  Sonographer:    Ronny Flurry Referring Phys: [place]  1. Left ventricular ejection fraction, by estimation, is 55 to 60%. The left ventricle has normal function. The left ventricle demonstrates regional wall motion abnormalities (see scoring diagram/findings for description). The basal-to-mid inferior LV wall remains hypokinetic (consistent with prior TTE). There is moderate concentric left ventricular hypertrophy. Diastolic function indeterminant due to moderate to severe MAC. The average left ventricular global longitudinal strain is -15.3 %. The global longitudinal strain is abnormal.  2. Right ventricular systolic function is mildly reduced. The right ventricular size is normal. There is normal pulmonary artery systolic pressure. The estimated right ventricular systolic pressure is 7.8 mmHg.  3.  Left atrial size was mild to moderately dilated.  4. The mitral valve is degenerative. Mild mitral valve regurgitation. Moderate to severe mitral annular calcification.  5. The aortic valve has been repaired/replaced. Aortic valve regurgitation is not visualized. There is a 23 mm Sapien prosthetic (TAVR) valve present in the aortic position. Echo findings are consistent with normal structure and function of the aortic valve prosthesis. Aortic valve mean gradient measures 14.0 mmHg. Aortic valve Vmax measures 2.52 m/s.  6. Aortic dilatation noted. There is borderline dilatation of the ascending aorta, measuring 36 mm. Comparison(s): Compared to prior TTE in 01/2022, the EF appears slightly more robust at 55-60% (previously 53% by 3D). Otherwise, there is no significant change. FINDINGS  Left Ventricle: The basal to mid inferior wall continues to by hypokinetic. Left ventricular ejection fraction, by estimation, is 55 to 60%. The left ventricle has normal function. The left ventricle demonstrates regional wall motion abnormalities. The average left ventricular global longitudinal strain is -15.3 %. The global longitudinal strain is abnormal. The left ventricular internal cavity size was normal in size. There is moderate concentric left ventricular hypertrophy. Diastolic function indeterminant due to moderate to severe MAC. Right Ventricle: The right ventricular size is normal. No increase in right ventricular wall thickness. Right ventricular systolic function is mildly reduced. There is normal pulmonary artery systolic pressure. The tricuspid regurgitant velocity is 1.10 m/s, and with an assumed right atrial pressure of 3 mmHg, the estimated right ventricular systolic pressure is 7.8 mmHg. Left Atrium: Left atrial size was mild to moderately dilated. Right Atrium: Right atrial size was normal in size. Pericardium: There is no evidence of pericardial effusion. Mitral Valve: The mitral valve is degenerative in  appearance. There is mild thickening of the mitral valve leaflet(s). There is mild calcification of the mitral valve leaflet(s). Moderate to severe mitral annular calcification. Mild mitral valve regurgitation. Tricuspid Valve: The tricuspid valve is normal in structure. Tricuspid valve regurgitation is trivial. Aortic  Valve: The aortic valve has been repaired/replaced. Aortic valve regurgitation is not visualized. Aortic valve mean gradient measures 14.0 mmHg. Aortic valve peak gradient measures 25.4 mmHg. Aortic valve area, by VTI measures 1.31 cm. There is a  23 mm Sapien prosthetic, stented (TAVR) valve present in the aortic position. Echo findings are consistent with normal structure and function of the aortic valve prosthesis. Pulmonic Valve: The pulmonic valve was normal in structure. Pulmonic valve regurgitation is trivial. Aorta: Aortic dilatation noted. There is borderline dilatation of the ascending aorta, measuring 36 mm. IAS/Shunts: The atrial septum is grossly normal.  LEFT VENTRICLE PLAX 2D LVIDd:         3.80 cm   Diastology LVIDs:         2.60 cm   LV e' medial:    3.23 cm/s LV PW:         1.20 cm   LV E/e' medial:  23.1 LV IVS:        1.60 cm   LV e' lateral:   6.38 cm/s LVOT diam:     1.70 cm   LV E/e' lateral: 11.7 LV SV:         66 LV SV Index:   40        2D Longitudinal Strain LVOT Area:     2.27 cm  2D Strain GLS Avg:     -15.3 %  RIGHT VENTRICLE RV S prime:     5.63 cm/s TAPSE (M-mode): 1.4 cm LEFT ATRIUM             Index        RIGHT ATRIUM           Index LA diam:        5.50 cm 3.30 cm/m   RA Area:     12.70 cm LA Vol (A2C):   45.2 ml 27.10 ml/m  RA Volume:   25.90 ml  15.53 ml/m LA Vol (A4C):   44.7 ml 26.80 ml/m LA Biplane Vol: 45.9 ml 27.52 ml/m  AORTIC VALVE AV Area (Vmax):    1.22 cm AV Area (Vmean):   1.41 cm AV Area (VTI):     1.31 cm AV Vmax:           252.00 cm/s AV Vmean:          147.250 cm/s AV VTI:            0.504 m AV Peak Grad:      25.4 mmHg AV Mean Grad:       14.0 mmHg LVOT Vmax:         135.50 cm/s LVOT Vmean:        91.300 cm/s LVOT VTI:          0.292 m LVOT/AV VTI ratio: 0.58  AORTA Ao Root diam: 2.60 cm Ao Asc diam:  3.60 cm MITRAL VALVE                TRICUSPID VALVE MV Area (PHT): 2.66 cm     TR Peak grad:   4.8 mmHg MV Decel Time: 285 msec     TR Vmax:        110.00 cm/s MV E velocity: 74.70 cm/s MV A velocity: 138.00 cm/s  SHUNTS MV E/A ratio:  0.54         Systemic VTI:  0.29 m  Systemic Diam: 1.70 cm Gwyndolyn Kaufman MD Electronically signed by Gwyndolyn Kaufman MD Signature Date/Time: 03/01/2022/12:05:18 PM    Final    DG Chest 2 View  Result Date: 02/28/2022 CLINICAL DATA:  Shortness of breath EXAM: CHEST - 2 VIEW COMPARISON:  01/22/2022 FINDINGS: The heart size and mediastinal contours are within normal limits. Cardiomegaly. Implantable loop recorder. Disc degenerative disease of the thoracic spine. IMPRESSION: Cardiomegaly without acute abnormality of the lungs. Electronically Signed   By: Delanna Ahmadi [name]   On: 02/28/2022 19:18   MR BRAIN WO CONTRAST  Result Date: 02/21/2022 CLINICAL DATA:  Neuro deficit, acute, stroke suspected. Dizziness and nausea. Visual disturbance. EXAM: MRI HEAD WITHOUT CONTRAST TECHNIQUE: Multiplanar, multiecho pulse sequences of the brain and surrounding structures were obtained without intravenous contrast. COMPARISON:  CT earlier same day FINDINGS: Brain: Diffusion imaging does not show any acute or subacute infarction. No focal abnormality affects the brainstem or cerebellum. There is an old right occipital infarction and an old right posterior frontal cortical and subcortical infarction. Evidence of old infarction in the right cingulate gyrus region. Mild chronic small-vessel ischemic changes affect white matter of both hemispheres otherwise. There is artifact related 2 a right middle cerebral artery stent. There are scattered foci of hemosiderin deposition in the brain related to old  infarctions. No evidence of mass, hydrocephalus or extra-axial collection. Vascular: Major vessels at the base of the brain show flow. As noted above. There is a right middle cerebral artery stent. Skull and upper cervical spine: Negative Sinuses/Orbits: Clear/normal Other: None IMPRESSION: No acute finding. Old right occipital infarction. Old right posterior frontal cortical and subcortical infarction. Old infarction in the right cingulate gyrus region. Scattered foci of hemosiderin deposition related to old infarctions. Mild chronic small-vessel ischemic changes of the hemispheric white matter otherwise. Electronically Signed   By: Nelson Chimes [name]   On: 02/21/2022 19:10   CT HEAD WO CONTRAST  Result Date: 02/21/2022 CLINICAL DATA:  Headache. EXAM: CT HEAD WITHOUT CONTRAST TECHNIQUE: Contiguous axial images were obtained from the base of the skull through the vertex without intravenous contrast. RADIATION DOSE REDUCTION: This exam was performed according to the departmental dose-optimization program which includes automated exposure control, adjustment of the mA and/or kV according to patient size and/or use of iterative reconstruction technique. COMPARISON:  Head CT dated 11/30/2021. FINDINGS: Brain: Ventricles are stable in size and configuration. Old infarcts are seen within the RIGHT frontal lobe and RIGHT occipital lobe. Mild chronic small vessel ischemic changes noted within the bilateral periventricular white matter regions. There is no mass, hemorrhage, edema or other evidence of acute parenchymal abnormality. No extra-axial hemorrhage. Vascular: Chronic calcified atherosclerotic changes of the large vessels at the skull base. Vascular stent again noted within the RIGHT M1 segment. No unexpected hyperdense vessel. Skull: No acute-appearing osseous abnormality. Sinuses/Orbits: Visualized upper paranasal sinuses are clear. Visualized upper periorbital and retro-orbital soft tissues are unremarkable.  Other: None. IMPRESSION: 1. No acute findings. No intracranial mass, hemorrhage or edema. 2. Old infarcts within the RIGHT frontal lobe and RIGHT occipital lobe with associated chronic encephalomalacia. 3. Mild chronic small vessel ischemic changes within the bilateral periventricular white matter regions. Electronically Signed   By: Franki Cabot [name]   On: 02/21/2022 15:44    Microbiology: Results for orders placed or performed during the hospital encounter of 01/17/22  Blood culture (routine x 2)     Status: None   Collection Time: 01/17/22  1:20 AM   Specimen: BLOOD RIGHT FOREARM  Result Value Ref Range Status   Specimen Description BLOOD RIGHT FOREARM  Final   Special Requests BOTTLES DRAWN AEROBIC AND ANAEROBIC  Final   Culture   Final    NO GROWTH 6 DAYS Performed at [facility], [address], [place], [place] [address]    Report Status 01/23/2022 FINAL  Final  Resp panel by RT-PCR (RSV, Flu A&B, Covid) Anterior Nasal Swab     Status: Abnormal   Collection Time: 01/17/22  1:21 AM   Specimen: Anterior Nasal Swab  Result Value Ref Range Status   SARS Coronavirus 2 by RT PCR NEGATIVE NEGATIVE Final    Comment: (NOTE) SARS-CoV-2 target nucleic acids are NOT DETECTED.  The SARS-CoV-2 RNA is generally detectable in upper respiratory specimens during the acute phase of infection. The lowest concentration of SARS-CoV-2 viral copies this assay can detect is 138 copies/mL. A negative result does not preclude SARS-Cov-2 infection and should not be used as the sole basis for treatment or other patient management decisions. A negative result may occur with  improper specimen collection/handling, submission of specimen other than nasopharyngeal swab, presence of viral mutation(s) within the areas targeted by this assay, and inadequate number of viral copies(<138 copies/mL). A negative result must be combined with clinical observations, patient history, and  epidemiological information. The expected result is Negative.  Fact Sheet for Patients:  EntrepreneurPulse.com.au  Fact Sheet for Healthcare Providers:  IncredibleEmployment.be  This test is no t yet approved or cleared by the [place] FDA and  has been authorized for detection and/or diagnosis of SARS-CoV-2 by FDA under an Emergency Use Authorization (EUA). This EUA will remain  in effect (meaning this test can be used) for the duration of the COVID-19 declaration under Section 564(b)(1) of the Act, 21 U.S.C.section 360bbb-3(b)(1), unless the authorization is terminated  or revoked sooner.       Influenza A by PCR NEGATIVE NEGATIVE Final   Influenza B by PCR NEGATIVE NEGATIVE Final    Comment: (NOTE) The Xpert Xpress SARS-CoV-2/FLU/RSV plus assay is intended as an aid in the diagnosis of influenza from Nasopharyngeal swab specimens and should not be used as a sole basis for treatment. Nasal washings and aspirates are unacceptable for Xpert Xpress SARS-CoV-2/FLU/RSV testing.  Fact Sheet for Patients: EntrepreneurPulse.com.au  Fact Sheet for Healthcare Providers: IncredibleEmployment.be  This test is not yet approved or cleared by the [place] FDA and has been authorized for detection and/or diagnosis of SARS-CoV-2 by FDA under an Emergency Use Authorization (EUA). This EUA will remain in effect (meaning this test can be used) for the duration of the COVID-19 declaration under Section 564(b)(1) of the Act, 21 U.S.C. section 360bbb-3(b)(1), unless the authorization is terminated or revoked.     Resp Syncytial Virus by PCR POSITIVE (A) NEGATIVE Final    Comment: (NOTE) Fact Sheet for Patients: EntrepreneurPulse.com.au  Fact Sheet for Healthcare Providers: IncredibleEmployment.be  This test is not yet approved or cleared by the [place] FDA and has  been authorized for detection and/or diagnosis of SARS-CoV-2 by FDA under an Emergency Use Authorization (EUA). This EUA will remain in effect (meaning this test can be used) for the duration of the COVID-19 declaration under Section 564(b)(1) of the Act, 21 U.S.C. section 360bbb-3(b)(1), unless the authorization is terminated or revoked.  Performed at [facility], [address], [place], [place] [address]   Blood culture (routine x 2)     Status: None   Collection Time: 01/17/22  1:28  AM   Specimen: BLOOD RIGHT HAND  Result Value Ref Range Status   Specimen Description BLOOD RIGHT HAND  Final   Special Requests BOTTLES DRAWN AEROBIC AND ANAEROBIC  Final   Culture   Final    NO GROWTH 6 DAYS Performed at [facility], [place] [address] [place], [place] [address]    Report Status 01/23/2022 FINAL  Final  MRSA Next Gen by PCR, Nasal     Status: None   Collection Time: 01/18/22  3:15 PM   Specimen: Nasal Mucosa; Nasal Swab  Result Value Ref Range Status   MRSA by PCR Next Gen NOT DETECTED NOT DETECTED Final    Comment: (NOTE) The GeneXpert MRSA Assay (FDA approved for NASAL specimens only), is one component of a comprehensive MRSA colonization surveillance program. It is not intended to diagnose MRSA infection nor to guide or monitor treatment for MRSA infections. Test performance is not FDA approved in patients less than 63 years old. Performed at [facility], [place] [address] [place], [place] [address]     Labs: CBC: Recent Labs  Lab 02/28/22 1743 03/01/22 0532 03/02/22 0244  WBC 7.9 6.4 6.1  NEUTROABS 4.0  --   --   HGB 14.0 12.7 12.1  HCT 41.7 38.7 37.4  MCV 88.7 90.6 91.7  PLT 259 240 [id number]   Basic Metabolic Panel: Recent Labs  Lab 02/28/22 1743 03/02/22 0244  NA 136 135  K 4.2 4.2  CL 96* 99  CO2 26 26  GLUCOSE 255* 216*  BUN 12 22  CREATININE 2.34* 3.19*  CALCIUM 8.9 8.3*  PHOS  --  3.6   Liver Function Tests: Recent  Labs  Lab 03/02/22 0244  ALBUMIN 2.4*   CBG: Recent Labs  Lab 03/01/22 1001 03/01/22 1330 03/01/22 1627 03/01/22 2139 03/02/22 0741  GLUCAP 152* 147* 304* 105* 163*    Discharge time spent: greater than 30 minutes.  Signed: [name], MD [facility] 03/02/2022

## 2022-03-02 NOTE — Progress Notes (Signed)
[facility] Progress Note   Subjective:    Seen and examined patient at bedside. Resting in bed. Denies SOB, CP, and N/V. Plan for shortened HD today. Cardiology following: following cardiac enzymes and 2D ECHO recently completed.  Objective Vitals:   03/01/22 1325 03/01/22 1631 03/01/22 2144 03/02/22 0759  BP: (!) 113/58 (!) 120/59 (!) 133/44 (!) 164/55  Pulse: 79 65 62 (!) 58  Resp: 17 19 16 15  $ Temp: 97.8 F (36.6 C) 98 F (36.7 C) 98.3 F (36.8 C) 98 F (36.7 C)  TempSrc: Axillary Oral Oral Oral  SpO2: 100% 99% 100% 100%  Weight:      Height:       Physical Exam General: Awake, alert, on [place], NAD Heart: S1 and S2; No MRGs Lungs: CTA; No wheezing, rales or rhonchi, breathing unlabored Abdomen: Soft and non-tender Extremities: No LE edema Dialysis Access: L AVF (+) B/T   Filed Weights   02/28/22 1730  Weight: 65.8 kg    Intake/Output Summary (Last 24 hours) at 03/02/2022 1034 Last data filed at 03/02/2022 0319 Gross per 24 hour  Intake 260.11 ml  Output --  Net 260.11 ml    Additional Objective Labs: Basic Metabolic Panel: Recent Labs  Lab 02/28/22 1743 03/02/22 0244  NA 136 135  K 4.2 4.2  CL 96* 99  CO2 26 26  GLUCOSE 255* 216*  BUN 12 22  CREATININE 2.34* 3.19*  CALCIUM 8.9 8.3*  PHOS  --  3.6   Liver Function Tests: Recent Labs  Lab 03/02/22 0244  ALBUMIN 2.4*   No results for input(s): "LIPASE", "AMYLASE" in the last 168 hours. CBC: Recent Labs  Lab 02/28/22 1743 03/01/22 0532 03/02/22 0244  WBC 7.9 6.4 6.1  NEUTROABS 4.0  --   --   HGB 14.0 12.7 12.1  HCT 41.7 38.7 37.4  MCV 88.7 90.6 91.7  PLT 259 240 225   Blood Culture    Component Value Date/Time   SDES BLOOD RIGHT HAND 01/17/2022 0128   SPECREQUEST BOTTLES DRAWN AEROBIC AND ANAEROBIC 01/17/2022 0128   CULT  01/17/2022 0128    NO GROWTH 6 DAYS Performed at [facility], [place] [address] [place], [place] [address]    REPTSTATUS 01/23/2022 FINAL  01/17/2022 0128    Cardiac Enzymes: No results for input(s): "CKTOTAL", "CKMB", "CKMBINDEX", "TROPONINI" in the last 168 hours. CBG: Recent Labs  Lab 03/01/22 1001 03/01/22 1330 03/01/22 1627 03/01/22 2139 03/02/22 0741  GLUCAP 152* 147* 304* 105* 163*   Iron Studies: No results for input(s): "IRON", "TIBC", "TRANSFERRIN", "FERRITIN" in the last 72 hours. Lab Results  Component Value Date   INR 1.0 02/21/2022   INR 1.0 05/16/2021   INR 1.1 11/09/2018   Studies/Results: DG Chest 1 View  Result Date: 03/01/2022 CLINICAL DATA:  Shortness of breath EXAM: CHEST  1 VIEW COMPARISON:  02/28/2022 FINDINGS: Numerous leads and wires project over the chest. Mild right hemidiaphragm elevation. Loop recorder. TAVR. Mild cardiomegaly. No pleural effusion or pneumothorax. No congestive failure. Clear lungs. IMPRESSION: Cardiomegaly without congestive failure. Electronically Signed   By: Abigail Miyamoto [name]   On: 03/01/2022 13:04   ECHOCARDIOGRAM COMPLETE  Result Date: 03/01/2022    ECHOCARDIOGRAM REPORT   Patient Name:   [name] Arlen Date of Exam: 03/01/2022 Medical Rec #:  [id number]     Height:       62.0 in Accession #:    [id number]    Weight:  145.1 lb Date of Birth:  04-13-1940     BSA:          1.668 m Patient Age:    82 years      BP:           124/40 mmHg Patient Gender: F             HR:           68 bpm. Exam Location:  Inpatient Procedure: 2D Echo, Cardiac Doppler, Color Doppler and Strain Analysis Indications:    Elevated Troponin  History:        Patient has prior history of Echocardiogram examinations, most                 recent 01/18/2022. CHF, CAD, Stroke, Signs/Symptoms:Shortness of                 Breath; Risk Factors:Hypertension, Diabetes and Dyslipidemia.                 ESRD.                 Aortic Valve: 23 mm Sapien prosthetic, stented (TAVR) valve is                 present in the aortic position.  Sonographer:    Ronny Flurry Referring Phys: [place]   1. Left ventricular ejection fraction, by estimation, is 55 to 60%. The left ventricle has normal function. The left ventricle demonstrates regional wall motion abnormalities (see scoring diagram/findings for description). The basal-to-mid inferior LV wall remains hypokinetic (consistent with prior TTE). There is moderate concentric left ventricular hypertrophy. Diastolic function indeterminant due to moderate to severe MAC. The average left ventricular global longitudinal strain is -15.3 %. The global longitudinal strain is abnormal.  2. Right ventricular systolic function is mildly reduced. The right ventricular size is normal. There is normal pulmonary artery systolic pressure. The estimated right ventricular systolic pressure is 7.8 mmHg.  3. Left atrial size was mild to moderately dilated.  4. The mitral valve is degenerative. Mild mitral valve regurgitation. Moderate to severe mitral annular calcification.  5. The aortic valve has been repaired/replaced. Aortic valve regurgitation is not visualized. There is a 23 mm Sapien prosthetic (TAVR) valve present in the aortic position. Echo findings are consistent with normal structure and function of the aortic valve prosthesis. Aortic valve mean gradient measures 14.0 mmHg. Aortic valve Vmax measures 2.52 m/s.  6. Aortic dilatation noted. There is borderline dilatation of the ascending aorta, measuring 36 mm. Comparison(s): Compared to prior TTE in 01/2022, the EF appears slightly more robust at 55-60% (previously 53% by 3D). Otherwise, there is no significant change. FINDINGS  Left Ventricle: The basal to mid inferior wall continues to by hypokinetic. Left ventricular ejection fraction, by estimation, is 55 to 60%. The left ventricle has normal function. The left ventricle demonstrates regional wall motion abnormalities. The average left ventricular global longitudinal strain is -15.3 %. The global longitudinal strain is abnormal. The left ventricular internal  cavity size was normal in size. There is moderate concentric left ventricular hypertrophy. Diastolic function indeterminant due to moderate to severe MAC. Right Ventricle: The right ventricular size is normal. No increase in right ventricular wall thickness. Right ventricular systolic function is mildly reduced. There is normal pulmonary artery systolic pressure. The tricuspid regurgitant velocity is 1.10 m/s, and with an assumed right atrial pressure of 3 mmHg, the estimated right ventricular systolic pressure is 7.8 mmHg.  Left Atrium: Left atrial size was mild to moderately dilated. Right Atrium: Right atrial size was normal in size. Pericardium: There is no evidence of pericardial effusion. Mitral Valve: The mitral valve is degenerative in appearance. There is mild thickening of the mitral valve leaflet(s). There is mild calcification of the mitral valve leaflet(s). Moderate to severe mitral annular calcification. Mild mitral valve regurgitation. Tricuspid Valve: The tricuspid valve is normal in structure. Tricuspid valve regurgitation is trivial. Aortic Valve: The aortic valve has been repaired/replaced. Aortic valve regurgitation is not visualized. Aortic valve mean gradient measures 14.0 mmHg. Aortic valve peak gradient measures 25.4 mmHg. Aortic valve area, by VTI measures 1.31 cm. There is a  23 mm Sapien prosthetic, stented (TAVR) valve present in the aortic position. Echo findings are consistent with normal structure and function of the aortic valve prosthesis. Pulmonic Valve: The pulmonic valve was normal in structure. Pulmonic valve regurgitation is trivial. Aorta: Aortic dilatation noted. There is borderline dilatation of the ascending aorta, measuring 36 mm. IAS/Shunts: The atrial septum is grossly normal.  LEFT VENTRICLE PLAX 2D LVIDd:         3.80 cm   Diastology LVIDs:         2.60 cm   LV e' medial:    3.23 cm/s LV PW:         1.20 cm   LV E/e' medial:  23.1 LV IVS:        1.60 cm   LV e' lateral:    6.38 cm/s LVOT diam:     1.70 cm   LV E/e' lateral: 11.7 LV SV:         66 LV SV Index:   40        2D Longitudinal Strain LVOT Area:     2.27 cm  2D Strain GLS Avg:     -15.3 %  RIGHT VENTRICLE RV S prime:     5.63 cm/s TAPSE (M-mode): 1.4 cm LEFT ATRIUM             Index        RIGHT ATRIUM           Index LA diam:        5.50 cm 3.30 cm/m   RA Area:     12.70 cm LA Vol (A2C):   45.2 ml 27.10 ml/m  RA Volume:   25.90 ml  15.53 ml/m LA Vol (A4C):   44.7 ml 26.80 ml/m LA Biplane Vol: 45.9 ml 27.52 ml/m  AORTIC VALVE AV Area (Vmax):    1.22 cm AV Area (Vmean):   1.41 cm AV Area (VTI):     1.31 cm AV Vmax:           252.00 cm/s AV Vmean:          147.250 cm/s AV VTI:            0.504 m AV Peak Grad:      25.4 mmHg AV Mean Grad:      14.0 mmHg LVOT Vmax:         135.50 cm/s LVOT Vmean:        91.300 cm/s LVOT VTI:          0.292 m LVOT/AV VTI ratio: 0.58  AORTA Ao Root diam: 2.60 cm Ao Asc diam:  3.60 cm MITRAL VALVE                TRICUSPID VALVE MV Area (PHT): 2.66 cm     TR  Peak grad:   4.8 mmHg MV Decel Time: 285 msec     TR Vmax:        110.00 cm/s MV E velocity: 74.70 cm/s MV A velocity: 138.00 cm/s  SHUNTS MV E/A ratio:  0.54         Systemic VTI:  0.29 m                             Systemic Diam: 1.70 cm Gwyndolyn Kaufman MD Electronically signed by Gwyndolyn Kaufman MD Signature Date/Time: 03/01/2022/12:05:18 PM    Final    DG Chest 2 View  Result Date: 02/28/2022 CLINICAL DATA:  Shortness of breath EXAM: CHEST - 2 VIEW COMPARISON:  01/22/2022 FINDINGS: The heart size and mediastinal contours are within normal limits. Cardiomegaly. Implantable loop recorder. Disc degenerative disease of the thoracic spine. IMPRESSION: Cardiomegaly without acute abnormality of the lungs. Electronically Signed   By: Delanna Ahmadi [name]   On: 02/28/2022 19:18    Medications:  heparin 750 Units/hr (03/02/22 0319)    amLODipine  5 mg Oral Daily   aspirin EC  81 mg Oral Daily   brimonidine  1 drop Left Eye  Daily   folic acid  1 mg Oral Daily   hydrALAZINE  100 mg Oral TID   insulin aspart  0-9 Units Subcutaneous TID PC & HS   isosorbide mononitrate  240 mg Oral Daily   labetalol  200 mg Oral BID   losartan  25 mg Oral QHS   mirtazapine  7.5 mg Oral QHS   pantoprazole  40 mg Oral Q1200   venlafaxine XR  150 mg Oral Daily    Dialysis Orders: In center hemodialysis at the [facility] transitional care unit (accepted at [facility] and scheduled to start 2/15). 4 times a week, 180 dialyzer, BFR 400/DFR 600, 3K/2.0 calcium, EDW 57 kg, 3 hours.   Assessment/Plan: 1.  Worsening shortness of breath: In the setting of an elevated troponin raising concern for acute coronary syndrome.  Recommendations noted by cardiology to cycle cardiac enzymes while placing the patient on a heparin drip.  Echocardiogram performed yesterday. 2.  End-stage renal disease: Plan for a shortened hemodialysis treatment today for clearance/efforts at ultrafiltration following which she can resume her outpatient TTS dialysis schedule on which [facility].  She will need recheck of her access flow at the outpatient dialysis unit +/- fistulogram based on its trend. 3.  Hypertension: Blood pressure marginally elevated on multiple agents including low-dose losartan 25 mg daily and amlodipine 5 mg daily.  Continue to follow with hemodialysis/UF. 4.  Anemia of chronic disease: Hemoglobin and hematocrit are currently at goal and without indications for ESA. 5.  Secondary hyperparathyroidism: Calcium and phosphorus levels are normal/at goal without phosphorus binders.  Continue renal diet.  Tobie Poet, NP [facility] 03/02/2022,10:34 AM  LOS: 0 days

## 2022-03-02 NOTE — Progress Notes (Addendum)
ANTICOAGULATION CONSULT NOTE   Pharmacy Consult for heparin Indication: chest pain/ACS  No Known Allergies  Patient Measurements: Height: 5' 2"$  (157.5 cm) Weight: 65.8 kg (145 lb 1 oz) IBW/kg (Calculated) : 50.1 Heparin Dosing Weight: 63kg  Vital Signs: Temp: 98.3 F (36.8 C) (02/13 2144) Temp Source: Oral (02/13 2144) BP: 133/44 (02/13 2144) Pulse Rate: 62 (02/13 2144)  Labs: Recent Labs    02/28/22 1743 02/28/22 2004 02/28/22 2320 03/01/22 0532 03/01/22 1359 03/02/22 0244  HGB 14.0  --   --  12.7  --  12.1  HCT 41.7  --   --  38.7  --  37.4  PLT 259  --   --  240  --  225  HEPARINUNFRC  --   --   --  0.53 0.44 0.42  CREATININE 2.34*  --   --   --   --   --   TROPONINIHS 281* 235* 264* 225*  --   --      Estimated Creatinine Clearance: 16.2 mL/min (A) (by C-G formula based on SCr of 2.34 mg/dL (H)).  Assessment: 11 YOF presenting with SOB, concern for ACS, she is not on anticoagulation PTA.  Heparin level is therapeutic at 0.42, on 750 units/hr. Hgb 12.1, plt 225. No s/sx of bleeding or infusion issues.   Goal of Therapy:  Heparin level 0.3-0.7 units/ml Monitor platelets by anticoagulation protocol: Yes   Plan:  Continue heparin infusion at 750 units/hr Monitor daily HL, CBC, and for s/sx of bleeding Plan for heparin for 48 hours - stop on 2/14~2100   Antonietta Jewel, PharmD, [place] Pharmacist  Phone: [phone] 03/02/2022 7:31 AM  Please check AMION for all [place] phone numbers After 10:00 PM, call [place] [phone]

## 2022-03-02 NOTE — Plan of Care (Signed)

## 2022-03-02 NOTE — Progress Notes (Addendum)
Subjective:  Breathing much better this morning. No chest pain.   Current Facility-Administered Medications:    acetaminophen (TYLENOL) tablet 650 mg, 650 mg, Oral, Q4H PRN, Tu, Ching T, DO   amLODipine (NORVASC) tablet 5 mg, 5 mg, Oral, Daily, Tu, Ching T, DO, 5 mg at 03/01/22 1004   aspirin EC tablet 81 mg, 81 mg, Oral, Daily, Tu, Ching T, DO, 81 mg at 03/01/22 1005   brimonidine (ALPHAGAN) 0.2 % ophthalmic solution 1 drop, 1 drop, Left Eye, Daily, Tu, Ching T, DO, 1 drop at [id number]   folic acid (FOLVITE) tablet 1 mg, 1 mg, Oral, Daily, Tu, Ching T, DO, 1 mg at 03/01/22 1005   heparin ADULT infusion 100 units/mL (25000 units/271m), 750 Units/hr, Intravenous, Continuous, Larsen Zettel J, MD, Last Rate: 7.5 mL/hr at 03/02/22 0319, 750 Units/hr at 03/02/22 0319   hydrALAZINE (APRESOLINE) tablet 100 mg, 100 mg, Oral, TID, Tu, Ching T, DO, 100 mg at 03/01/22 2207   insulin aspart (novoLOG) injection 0-9 Units, 0-9 Units, Subcutaneous, TID PC & HS, Tu, Ching T, DO, 7 Units at 03/01/22 1654   isosorbide mononitrate (IMDUR) 24 hr tablet 240 mg, 240 mg, Oral, Daily, Tu, Ching T, DO, 240 mg at 03/01/22 1005   labetalol (NORMODYNE) tablet 200 mg, 200 mg, Oral, BID, Tu, Ching T, DO, 200 mg at 03/01/22 2207   lidocaine (PF) (XYLOCAINE) 1 % injection 5 mL, 5 mL, Intradermal, PRN, PElmarie Shiley MD   lidocaine-prilocaine (EMLA) cream 1 Application, 1 Application, Topical, PRN, PElmarie Shiley MD   losartan (COZAAR) tablet 25 mg, 25 mg, Oral, QHS, Tu, Ching T, DO, 25 mg at 03/01/22 2207   mirtazapine (REMERON) tablet 7.5 mg, 7.5 mg, Oral, QHS, Tu, Ching T, DO, 7.5 mg at 03/01/22 2207   ondansetron (ZOFRAN) injection 4 mg, 4 mg, Intravenous, Q6H PRN, Tu, Ching T, DO   pantoprazole (PROTONIX) EC tablet 40 mg, 40 mg, Oral, Q1200, LDarrick Meigs GMarge Duncans MD, 40 mg at 03/01/22 1505   pentafluoroprop-tetrafluoroeth (GEBAUERS) aerosol 1 Application, 1 Application, Topical, PRN, PElmarie Shiley MD   venlafaxine XR  (EFFEXOR-XR) 24 hr capsule 150 mg, 150 mg, Oral, Daily, Tu, Ching T, DO, 150 mg at 03/01/22 1005   Objective:  Vital Signs in the last 24 hours: Temp:  [97.7 F (36.5 C)-98.3 F (36.8 C)] 97.7 F (36.5 C) (02/14 1050) Pulse Rate:  [51-79] 52 (02/14 1100) Resp:  [11-20] 19 (02/14 1100) BP: (113-167)/(44-59) 125/55 (02/14 1100) SpO2:  [99 %-100 %] 100 % (02/14 1100) Weight:  [68.8 kg] 68.8 kg (02/14 1046)  Intake/Output from previous day: 02/13 0701 - 02/14 0700 In: 260.1 [I.V.:260.1] Out: -   Physical Exam Vitals and nursing note reviewed.  Constitutional:      General: She is not in acute distress. Neck:     Vascular: No JVD.  Cardiovascular:     Rate and Rhythm: Normal rate and regular rhythm.     Heart sounds: Normal heart sounds. No murmur heard. Pulmonary:     Effort: Pulmonary effort is normal.     Breath sounds: Normal breath sounds. No wheezing or rales.  Musculoskeletal:     Right lower leg: No edema.     Left lower leg: No edema.      Imaging/tests reviewed and independently interpreted: Lab Results: CBC, BMP, trop, BNP   Cardiac Studies:   Telemetry 03/02/2022: No significant arrhythmia   EKG 02/28/2022: Sinus rhythm with PAC Nonspecific T wave abnormality   Echocardiogram 03/01/2022:  1.  Left ventricular ejection fraction, by estimation, is 55 to 60%. The  left ventricle has normal function. The left ventricle demonstrates  regional wall motion abnormalities (see scoring diagram/findings for  description). The basal-to-mid inferior LV  wall remains hypokinetic (consistent with prior TTE). There is moderate  concentric left ventricular hypertrophy. Diastolic function indeterminant  due to moderate to severe MAC. The average left ventricular global  longitudinal strain is -15.3 %. The  global longitudinal strain is abnormal.   2. Right ventricular systolic function is mildly reduced. The right  ventricular size is normal. There is normal pulmonary  artery systolic  pressure. The estimated right ventricular systolic pressure is 7.8 mmHg.   3. Left atrial size was mild to moderately dilated.   4. The mitral valve is degenerative. Mild mitral valve regurgitation.  Moderate to severe mitral annular calcification.   5. The aortic valve has been repaired/replaced. Aortic valve  regurgitation is not visualized. There is a 23 mm Sapien prosthetic (TAVR)  valve present in the aortic position. Echo findings are consistent with  normal structure and function of the aortic  valve prosthesis. Aortic valve mean gradient measures 14.0 mmHg. Aortic  valve Vmax measures 2.52 m/s.   6. Aortic dilatation noted. There is borderline dilatation of the  ascending aorta, measuring 36 mm.   Comparison(s): Compared to prior TTE in 01/2022, the EF appears slightly  more robust at 55-60% (previously 53% by 3D). Otherwise, there is no  significant change.      RHC/LHC 10/2018: RA: 4 mmHg RV: 54/9 mmHg PA: 54/17 mmHg, mean PAP 34 mmhg. PCWP: 25 mmHg with tall V wave   CO: 5.2 L/min CI: 3 L/min/m2   LM: Normal LAD: Normal LCx:: Normal RCA: Ostial calcific at least 60% stenosis. Ostial RPDA 20% stenosis.    Right coronary artery was difficult to engage. I had to use multiple catheters, and was finally able to engage with [facility] catheter. there was dampnening of the pressure with catheter engagement. Ostium is at least 60% stenosed with moderate calcification. There was no signifciant improvement with IC NTG. I did not perform FFR in order to limit contrast use in this patient with Cr of 3.0, not on dialysis.    Recommendation: Will discuss with structural valve clinic. If functional evaluation of RCA is felt necesary prior to TAVR/SAVR, would consider non-invasive stress test.      Assessment & Recommendations:   African American female with hypertension, hyperlipidemia, diabetes, ESRD now on HD 4 times/week since 01/2022, CAD, h/o strokes (2015, 2020),  s/p TAVR for severe AS (11/2018), PAD, with shortness of breath, troponin elevation   Shortness of breath, troponin elevation: Recent initiation of dialysis, BNP elevation suggestive of congestive heart failure.  Troponin elevation flat, not consistent with ACS.  Historical troponin levels suggest that they have been higher since she has been on dialysis, which could suggest demand ischemia.  She has known CAD, and has possibly had some injury in RCA territory at some point, most abnormalities.  There are no different from recent echocardiograms.  Okay to stop heparin after 48 hours. Continue additional dialysis during this hospitalization.  If patient stays inpatient, will consider ischemia testing this hospitalization, on 03/03/2022.  If patient is otherwise stable for discharge today, will arrange stress testing outpatient.   CAD: Known CAD with moderate ostial RCA disease on pre-TAVR cath in 2020.   See discussion re: troponin elevation as above. Continue aspirin, heparin, statin.   Discussed interpretation of tests and management  recommendations with the primary team   Nigel Mormon, MD Pager: [phone] Office: [phone]

## 2022-03-02 NOTE — Progress Notes (Signed)
D/C order noted. Contacted [facility] [place] to advise clinic that pt will d/c today and should start at clinic tomorrow as planned.   Melven Sartorius Renal Navigator [phone]

## 2022-03-04 ENCOUNTER — Telehealth (HOSPITAL_COMMUNITY): Payer: Self-pay | Admitting: Nephrology

## 2022-03-04 ENCOUNTER — Other Ambulatory Visit: Payer: Self-pay | Admitting: Cardiology

## 2022-03-04 DIAGNOSIS — I25118 Atherosclerotic heart disease of native coronary artery with other forms of angina pectoris: Secondary | ICD-10-CM

## 2022-03-04 DIAGNOSIS — R0789 Other chest pain: Secondary | ICD-10-CM

## 2022-03-04 NOTE — Telephone Encounter (Signed)
Transition of care contact from inpatient facility  Date of Discharge: 03/02/22 Date of Contact: 03/04/22 - attempted Method of contact: Phone  Attempted to contact patient to discuss transition of care from inpatient admission. Patient did not answer the phone. There was no ability to leave a voicemail message. Will attempt to speak with her during dialysis next week.  Veneta Penton, PA-C [employer] Pager [phone]

## 2022-03-04 NOTE — Telephone Encounter (Signed)
Transition of Care - Initial Contact from Inpatient Facility  Date of discharge: 03/02/21 Date of contact: 03/04/22 --- pt called back  Method: Phone Spoke to: Patient  Patient contacted to discuss transition of care from recent inpatient hospitalization. Patient was admitted to [facility] from 2/12-2/14/24 with discharge Dx of elevated troponin.  The discharge medication list was reviewed. Patient understands the changes and has no concerns.   Patient will return to his/her outpatient HD unit on: TTS schedule - went yesterday and no issues.  No other concerns at this time.  Veneta Penton, PA-C [employer] Pager [phone]

## 2022-03-08 ENCOUNTER — Other Ambulatory Visit: Payer: Self-pay | Admitting: Cardiology

## 2022-03-08 NOTE — Telephone Encounter (Signed)
Refill

## 2022-04-07 ENCOUNTER — Ambulatory Visit: Payer: Medicare PPO | Admitting: Cardiology

## 2022-04-07 ENCOUNTER — Encounter: Payer: Self-pay | Admitting: Cardiology

## 2022-04-07 VITALS — BP 155/62 | HR 77 | Resp 16 | Ht 62.0 in | Wt 125.2 lb

## 2022-04-07 DIAGNOSIS — I2510 Atherosclerotic heart disease of native coronary artery without angina pectoris: Secondary | ICD-10-CM

## 2022-04-07 DIAGNOSIS — N186 End stage renal disease: Secondary | ICD-10-CM

## 2022-04-07 DIAGNOSIS — Z952 Presence of prosthetic heart valve: Secondary | ICD-10-CM

## 2022-04-07 DIAGNOSIS — I5032 Chronic diastolic (congestive) heart failure: Secondary | ICD-10-CM

## 2022-04-07 NOTE — Progress Notes (Signed)
Subjective:   [name], female    DOB: [birth year], 82 y.o.   MRN: [id number]  HPI   Chief Complaint  Patient presents with   Coronary Artery Disease   Chronic heart failure with preserved ejection fraction    Follow-up    6 months    82 y.o. African American female with hypertension, hyperlipidemia, diabetes, ESRD on HD, CAD, h/o strokes (2015, 2020), s/p TAVR for severe AS (11/2018)  Patient is now on dialysis. She feels generalized weakness since being on dialysis, but understands it was necessary for her. Her leg swelling has completely resolved, without the ned for diuretics. She has lost several pounds. She walks around the house without any complaints of chest pain. Blood pressure slightly elevated today, but is lower at the time of dialysis. Her nephrologist is managing her hypertension.     Current Outpatient Medications:    Acetaminophen 500 MG capsule, Take 500 mg by mouth every 6 (six) hours as needed for moderate pain or headache., Disp: , Rfl:    albuterol (VENTOLIN HFA) 108 (90 Base) MCG/ACT inhaler, Inhale 2 puffs into the lungs every 6 (six) hours as needed for wheezing or shortness of breath., Disp: 8 g, Rfl: 0   aspirin EC 81 MG tablet, Take 81 mg by mouth daily., Disp: , Rfl:    brimonidine (ALPHAGAN) 0.2 % ophthalmic solution, Place 1 drop into the left eye daily., Disp: , Rfl:    folic acid (FOLVITE) 1 MG tablet, Take 1 mg by mouth daily., Disp: , Rfl:    HUMALOG KWIKPEN 100 UNIT/ML KwikPen, Inject 1-5 Units into the skin See admin instructions. Inject 1-5 units 3 times daily with meals and at bedtime. Dose depends on what pt is eating., Disp: , Rfl:    hydrALAZINE (APRESOLINE) 100 MG tablet, Take 100 mg by mouth 3 (three) times daily., Disp: , Rfl:    Insulin Degludec (TRESIBA) 100 UNIT/ML SOLN, Inject 10 Units into the skin at bedtime., Disp: 10 mL, Rfl: 1   isosorbide mononitrate (IMDUR) 120 MG 24 hr tablet, TAKE 2 TABLETS BY MOUTH DAILY, Disp: 180  tablet, Rfl: 1   labetalol (NORMODYNE) 200 MG tablet, TAKE 1 TABLET BY MOUTH TWICE A DAY, Disp: 180 tablet, Rfl: 3   losartan (COZAAR) 25 MG tablet, Take 1 tablet (25 mg total) by mouth at bedtime., Disp: 30 tablet, Rfl: 2   pantoprazole (PROTONIX) 40 MG tablet, Take 1 tablet (40 mg total) by mouth daily at 12 noon., Disp: 30 tablet, Rfl: 1   REPATHA SURECLICK XX123456 MG/ML SOAJ, INJECT 1 ML INTO THE SKIN EVERY 14 (FOURTEEN) DAYS., Disp: 6 mL, Rfl: 3   venlafaxine XR (EFFEXOR-XR) 150 MG 24 hr capsule, Take 1 capsule (150 mg total) by mouth daily., Disp: 30 capsule, Rfl: 0   allopurinol (ZYLOPRIM) 100 MG tablet, Take 50 mg by mouth daily. (Patient not taking: Reported on 04/07/2022), Disp: , Rfl:    amoxicillin (AMOXIL) 500 MG tablet, TAKE 4 TABS AS DIRECTED 30-60 MIN BEFORE DENTAL PROCEDURE (Patient not taking: Reported on 04/07/2022), Disp: 4 tablet, Rfl: 5  Cardiovascular & other pertient studies:  EKG 04/07/2022: Sinus rhythm 73 bpm  Nonspecific T-abnormality  Personally reviewed and independently interpreted.  Echocardiogram 03/01/2022: 1. Left ventricular ejection fraction, by estimation, is 55 to 60%. The left ventricle has normal function. The left ventricle demonstrates regional wall motion abnormalities (see scoring diagram/findings for description). The basal-to-mid inferior LV wall remains hypokinetic (consistent with prior TTE). There is  moderate concentric left ventricular hypertrophy. Diastolic function indeterminant due to moderate to severe MAC. The average left ventricular global longitudinal strain is -15.3 %. The global longitudinal strain is abnormal.  2. Right ventricular systolic function is mildly reduced. The right ventricular size is normal. There is normal pulmonary artery systolic pressure. The estimated right ventricular systolic pressure is 7.8 mmHg.  3. Left atrial size was mild to moderately dilated.  4. The mitral valve is degenerative. Mild mitral valve  regurgitation. Moderate to severe mitral annular calcification.  5. The aortic valve has been repaired/replaced. Aortic valve regurgitation is not visualized. There is a 23 mm Sapien prosthetic (TAVR) valve present in the aortic position. Echo findings are consistent with normal structure and function of the aortic valve prosthesis. Aortic valve mean gradient measures 14.0 mmHg. Aortic valve Vmax measures 2.52 m/s.  6. Aortic dilatation noted. There is borderline dilatation of the ascending aorta, measuring 36 mm.  Comparison(s): Compared to prior TTE in 01/2022, the EF appears slightly more robust at 55-60% (previously 53% by 3D). Otherwise, there is no significant change.  CT chest 07/05/2021: 1. Ill-defined masslike opacity with air bronchograms within the right upper lobe favoring an inflammatory/infectious process. However, follow-up is recommended after appropriate antibiotic therapy.  2. Cardiomegaly post TAVR with mild pulmonary vascular congestion.  3. A few scattered tiny pulmonary nodules measuring up to 4 mm likely reflecting a nonspecific inflammatory/infectious process. Attention on follow-up is recommended.  4. Small bilateral pleural effusions.    ---------------------------------------------------------------  RHC/LHC 10/2018: RA: 4 mmHg RV: 54/9 mmHg PA: 54/17 mmHg, mean PAP 34 mmhg. PCWP: 25 mmHg with tall V wave   CO: 5.2 L/min CI: 3 L/min/m2   LM: Normal LAD: Normal LCx:: Normal RCA: Ostial calcific at least 60% stenosis. Ostial RPDA 20% stenosis.    Right coronary artery was difficult to engage. I had to use multiple catheters, and was finally able to engage with [facility] catheter. there was dampnening of the pressure with catheter engagement. Ostium is at least 60% stenosed with moderate calcification. There was no signifciant improvement with IC NTG. I did not perform FFR in order to limit contrast use in this patient with Cr of 3.0, not on dialysis.     Recommendation: Will discuss with structural valve clinic. If functional evaluation of RCA is felt necesary prior to TAVR/SAVR, would consider non-invasive stress test.   Personally reviewed and independently interpreted.  Recent labs: 03/02/2022: Glucose 216, BUN/Cr 22/3.19. EGFR 14. Na/K 135/4.2. Albumin 2.4. Rest of the CMP normal H/H 12/37. MCV 91. Platelets 225  09/14/2021: Glucose 86, BUN/Cr 73/4.17. EGFR 10. Na/K 139/4.5. Albumin 3.0. Protein 6.2. Rest of the CMP normal H/H 10/32. MCV 87. Platelets 306 HbA1C 7.2% TSH 2.6 normal  07/07/2021: Glucose 220, BUN/Cr 52/3.52. EGFR 12. Na/K 135/3.5.  H/H 11/34. MCV 86. Platelets 220 HbA1C 7.1% Trop HS 46 BNP 1792  12/26/2020: Glucose 132, BUN/Cr 51/3.28. EGFR 14. Na/K 139/4.5. Rest of the CMP normal H/H 11/35. MCV 89. Platelets 258 Chol 171, TG 113, HDL 60, LDL 91 BNP 632 Trop HS 21, 25  08/13/2020: Glucose 135, BUN 45, creatinine 2.49, GFR 19, sodium 140, potassium 4.9   Review of Systems  Constitutional: Negative for malaise/fatigue and weight gain.  Cardiovascular:  Positive for dyspnea on exertion and leg swelling. Negative for chest pain, claudication, near-syncope, orthopnea and paroxysmal nocturnal dyspnea.  Respiratory:  Negative for shortness of breath.   Hematologic/Lymphatic: Does not bruise/bleed easily.  Gastrointestinal:  Negative for melena.  Neurological:  Negative for dizziness and weakness.        Vitals:   04/07/22 0940  BP: (!) 155/62  Pulse: 77  Resp: 16  SpO2: 99%    Body mass index is 22.9 kg/m. Filed Weights   04/07/22 0940  Weight: 125 lb 3.2 oz (56.8 kg)     Objective:   Physical Exam Vitals and nursing note reviewed.  Constitutional:      General: She is not in acute distress.    Appearance: She is well-developed.  Cardiovascular:     Rate and Rhythm: Normal rate and regular rhythm.     Pulses: Intact distal pulses.     Heart sounds: S1 normal and S2 normal. Murmur heard.      Harsh midsystolic murmur is present with a grade of 1/6 at the upper right sternal border radiating to the neck.     No gallop.  Pulmonary:     Effort: Pulmonary effort is normal.     Breath sounds: Normal breath sounds.  Musculoskeletal:     Right lower leg: Edema (Trace) present.     Left lower leg: Edema (Trace) present.  Neurological:     Mental Status: She is alert.       Assessment & Recommendations:    82 y.o. African American female with hypertension, hyperlipidemia, diabetes, ESRD on HD, CAD, h/o strokes (2015, 2020), s/p TAVR for severe AS (11/2018)  HFpEF: Was secondary to advanced CKD, now resolved since being on dialysis.  CAD: Known CAD with moderate ostial RCA disease on pre-TAVR cath in 2020.   Angina symptoms are fairly well controlled on current anti anginal therapy.  Continue Aspirin 81 mg daily. No changes made today to her antihypertensive and antianginal therapy.    S/P TAVR (transcatheter aortic valve replacement): Normal functioning bioprosthetic aortic valve (Echocardiogram 02/2022) Will repeat echocardiogram in 02/2023 (Will order at next visit). Continue Aspirin 81 mg daily. Functioning well on echocardiogram 12/2020. Diastolic dysfunction due to uncontrolled hypertension, advanced CKD.   F/u in 6 months    Nigel Mormon, PA-C 04/07/2022, 8:46 AM Office: [phone]

## 2022-04-10 ENCOUNTER — Emergency Department (HOSPITAL_COMMUNITY): Payer: Medicare PPO

## 2022-04-10 ENCOUNTER — Other Ambulatory Visit: Payer: Self-pay

## 2022-04-10 ENCOUNTER — Emergency Department (HOSPITAL_COMMUNITY)
Admission: EM | Admit: 2022-04-10 | Discharge: 2022-04-10 | Disposition: A | Discharge status: Home / Self Care | Payer: Medicare PPO | Attending: Emergency Medicine | Admitting: Emergency Medicine

## 2022-04-10 DIAGNOSIS — E119 Type 2 diabetes mellitus without complications: Secondary | ICD-10-CM | POA: Insufficient documentation

## 2022-04-10 DIAGNOSIS — I110 Hypertensive heart disease with heart failure: Secondary | ICD-10-CM | POA: Insufficient documentation

## 2022-04-10 DIAGNOSIS — R0789 Other chest pain: Secondary | ICD-10-CM | POA: Diagnosis not present

## 2022-04-10 DIAGNOSIS — N186 End stage renal disease: Secondary | ICD-10-CM | POA: Diagnosis not present

## 2022-04-10 DIAGNOSIS — I509 Heart failure, unspecified: Secondary | ICD-10-CM | POA: Insufficient documentation

## 2022-04-10 DIAGNOSIS — Z992 Dependence on renal dialysis: Secondary | ICD-10-CM | POA: Insufficient documentation

## 2022-04-10 LAB — COMPREHENSIVE METABOLIC PANEL
ALT: 12 U/L (ref 0–44)
AST: 22 U/L (ref 15–41)
Albumin: 3.2 g/dL — ABNORMAL LOW (ref 3.5–5.0)
Alkaline Phosphatase: 77 U/L (ref 38–126)
Anion gap: 13 (ref 5–15)
BUN: 62 mg/dL — ABNORMAL HIGH (ref 8–23)
CO2: 24 mmol/L (ref 22–32)
Calcium: 10 mg/dL (ref 8.9–10.3)
Chloride: 95 mmol/L — ABNORMAL LOW (ref 98–111)
Creatinine, Ser: 4.46 mg/dL — ABNORMAL HIGH (ref 0.44–1.00)
GFR, Estimated: 9 mL/min — ABNORMAL LOW (ref >60)
Glucose, Bld: 144 mg/dL — ABNORMAL HIGH (ref 70–99)
Potassium: 3.7 mmol/L (ref 3.5–5.1)
Sodium: 132 mmol/L — ABNORMAL LOW (ref 135–145)
Total Bilirubin: 0.7 mg/dL (ref 0.3–1.2)
Total Protein: 6.3 g/dL — ABNORMAL LOW (ref 6.5–8.1)

## 2022-04-10 LAB — CBC WITH DIFFERENTIAL/PLATELET
Abs Immature Granulocytes: 0.02 10*3/uL (ref 0.00–0.07)
Basophils Absolute: 0 10*3/uL (ref 0.0–0.1)
Basophils Relative: 0 %
Eosinophils Absolute: 0.1 10*3/uL (ref 0.0–0.5)
Eosinophils Relative: 1 %
HCT: 35.4 % — ABNORMAL LOW (ref 36.0–46.0)
Hemoglobin: 11.8 g/dL — ABNORMAL LOW (ref 12.0–15.0)
Immature Granulocytes: 0 %
Lymphocytes Relative: 30 %
Lymphs Abs: 2.3 10*3/uL (ref 0.7–4.0)
MCH: 29.5 pg (ref 26.0–34.0)
MCHC: 33.3 g/dL (ref 30.0–36.0)
MCV: 88.5 fL (ref 80.0–100.0)
Monocytes Absolute: 0.5 10*3/uL (ref 0.1–1.0)
Monocytes Relative: 7 %
Neutro Abs: 4.7 10*3/uL (ref 1.7–7.7)
Neutrophils Relative %: 62 %
Platelets: 289 10*3/uL (ref 150–400)
RBC: 4 MIL/uL (ref 3.87–5.11)
RDW: 14.6 % (ref 11.5–15.5)
WBC: 7.5 10*3/uL (ref 4.0–10.5)
nRBC: 0 % (ref 0.0–0.2)

## 2022-04-10 LAB — PROTIME-INR
INR: 0.9 (ref 0.8–1.2)
Prothrombin Time: 12.4 seconds (ref 11.4–15.2)

## 2022-04-10 LAB — TROPONIN I (HIGH SENSITIVITY)
Troponin I (High Sensitivity): 51 ng/L — ABNORMAL HIGH (ref <18)
Troponin I (High Sensitivity): 45 ng/L — ABNORMAL HIGH (ref <18)

## 2022-04-10 LAB — LIPASE, BLOOD: Lipase: 57 U/L — ABNORMAL HIGH (ref 11–51)

## 2022-04-10 MED ORDER — FAMOTIDINE IN NACL 20-0.9 MG/50ML-% IV SOLN
20.0000 mg | Freq: Once | INTRAVENOUS | Status: AC
  Administered 2022-04-10: 20 mg via INTRAVENOUS
  Filled 2022-04-10: qty 50

## 2022-04-10 MED ORDER — ALUM & MAG HYDROXIDE-SIMETH 200-200-20 MG/5ML PO SUSP
30.0000 mL | Freq: Once | ORAL | Status: AC
  Administered 2022-04-10: 30 mL via ORAL
  Filled 2022-04-10: qty 30

## 2022-04-10 NOTE — ED Provider Notes (Signed)
[facility] Provider Note  CSN: [id number] Arrival date & time: 04/10/22 1234  Chief Complaint(s) Chest Pain  HPI [name] is a 82 y.o. female history of CHF, end-stage renal disease on Monday Wednesday Friday dialysis, diabetes, prior stroke presenting to the emergency department with chest pain.  The patient reports that she developed a burning sensation in her chest after drinking coffee.  She maybe had slight pain before but very minimal.  She denies any nausea or vomiting.  Denies any shortness of breath.  Denies any back pain.  EMS reported headaches although patient denies any headaches now.  She reports it feels like indigestion.  Does not feel like prior heart attack.  She also reports belching and burping sensation.  No numbness, tingling, weakness.   Past Medical History Past Medical History:  Diagnosis Date   Arthritis    Cerebrovascular disease    CHF (congestive heart failure) ([place])    CKD (chronic kidney disease)    Sees Dr Hollie Salk   Coronary artery disease    Depression    Diabetes ([place])    INSULIN DEPENDENT   Diabetic peripheral neuropathy (HCC)    Diabetic retinopathy ([place])    Diverticulitis    GERD (gastroesophageal reflux disease)    History of CVA (cerebrovascular accident)    x 2 no residulal   Hyperlipidemia    Hypertension    Obesity    Renal lesion    S/P TAVR (transcatheter aortic valve replacement) 11/13/2018   s/p TAVR with a 31mm Edwards S3U via the TF approach by Dr. [name] and Dr. Angelena Form   Severe aortic stenosis    Patient Active Problem List   Diagnosis Date Noted   Elevated troponin 02/28/2022   ESRD on dialysis ([place]) 02/28/2022   Malnutrition of moderate degree 01/20/2022   Acute respiratory failure with hypoxia ([place]) 01/18/2022   Acute on chronic diastolic CHF (congestive heart failure) ([place]) 01/17/2022   RSV (respiratory syncytial virus pneumonia) 01/17/2022   COVID-19 virus infection  09/07/2021   DM (diabetes mellitus), type 2 with renal complications ([place]) 0000000   History of vitrectomy 05/04/2021   Pseudophakia, right eye 05/04/2021   Right posterior capsular opacification 05/04/2021   Intolerance of continuous positive airway pressure (CPAP) ventilation 03/25/2021   Non compliance with medical treatment 03/25/2021   Difficulty with adaptive servo-ventilation (ASV) use 03/25/2021   Treatment-emergent central sleep apnea 08/27/2020   Complex sleep apnea syndrome 08/27/2020   Need for prophylactic antibiotic 07/09/2020   CKD (chronic kidney disease), stage V ([place]) 07/09/2020   Community acquired pneumonia 06/25/2020   PVD (peripheral vascular disease) with claudication ([place]) 06/04/2020   Cardiovascular renal disease, stage 1-4 or unspecified chronic kidney disease, without heart failure 05/13/2020   SOB (shortness of breath) 05/12/2020   Cerebrovascular accident (CVA) due to embolism of right posterior cerebral artery ([place]) 05/11/2020   CHF (congestive heart failure) ([place]) 05/11/2020   Stable treated proliferative diabetic retinopathy of right eye without macular edema determined by examination associated with type 2 diabetes mellitus ([place]) 03/02/2020   Posterior vitreous detachment of right eye 03/02/2020   Anemia 02/28/2020   Acute on chronic renal failure ([place]) 02/28/2020   Hypokalemia    Diabetic peripheral neuropathy (HCC)    Benign essential HTN    Cerebral infarction due to embolism of right anterior cerebral artery ([place]) 02/19/2020   Ischemic stroke of frontal lobe ([place]) 02/17/2020   Cerebral embolism with cerebral infarction 02/12/2020  Thrombocytopenia ([place]) 02/10/2020   Acute left-sided weakness 02/09/2020   Weakness 02/08/2020   Chronic heart failure with preserved ejection fraction ([place]) 01/02/2020   Atypical chest pain 05/31/2019   Central retinal vein occlusion, left eye, stable 05/29/2019   Central retinal vein occlusion of left eye  05/28/2019   Proliferative diabetic retinopathy of left eye with macular edema associated with type 2 diabetes mellitus ([place]) 05/28/2019   Acute on chronic combined systolic and diastolic CHF (congestive heart failure) ([place]) 11/13/2018   S/P TAVR (transcatheter aortic valve replacement) 11/13/2018   Essential hypertension    Hyperlipidemia    Renal lesion    Acute kidney injury superimposed on chronic kidney disease ([place]) 12/05/2017   Coronary artery disease involving native coronary artery of native heart without angina pectoris 01/07/2014   S/P PTCA (percutaneous transluminal coronary angioplasty) 01/07/2014   History of ischemic anterior cerebral artery stroke 01/30/2013   Home Medication(s) Prior to Admission medications   Medication Sig Start Date End Date Taking? Authorizing Provider  Acetaminophen 500 MG capsule Take 500 mg by mouth every 6 (six) hours as needed for moderate pain or headache.    [provider]  albuterol (VENTOLIN HFA) 108 (90 Base) MCG/ACT inhaler Inhale 2 puffs into the lungs every 6 (six) hours as needed for wheezing or shortness of breath. 01/24/22   [name], MD  allopurinol (ZYLOPRIM) 100 MG tablet Take 50 mg by mouth daily. Patient not taking: Reported on 04/07/2022    [provider]  amoxicillin (AMOXIL) 500 MG tablet TAKE 4 TABS AS DIRECTED 30-60 MIN BEFORE DENTAL PROCEDURE Patient not taking: Reported on 04/07/2022 03/08/22   Nigel Mormon, MD  aspirin EC 81 MG tablet Take 81 mg by mouth daily.    [provider]  brimonidine (ALPHAGAN) 0.2 % ophthalmic solution Place 1 drop into the left eye daily. 04/19/19   [provider]  folic acid (FOLVITE) 1 MG tablet Take 1 mg by mouth daily.    [provider]  HUMALOG KWIKPEN 100 UNIT/ML KwikPen Inject 1-5 Units into the skin See admin instructions. Inject 1-5 units 3 times daily with meals and at bedtime. Dose depends on what pt is eating. 05/28/18   [provider]  hydrALAZINE (APRESOLINE) 100 MG tablet Take 100 mg by mouth 3 (three) times daily. 08/09/21   [provider]  Insulin Degludec (TRESIBA) 100 UNIT/ML SOLN Inject 10 Units into the skin at bedtime. 01/24/22   [name], MD  isosorbide mononitrate (IMDUR) 120 MG 24 hr tablet TAKE 2 TABLETS BY MOUTH DAILY 03/04/22   Patwardhan, Manish J, MD  labetalol (NORMODYNE) 200 MG tablet TAKE 1 TABLET BY MOUTH TWICE A DAY 11/02/21   Patwardhan, Manish J, MD  losartan (COZAAR) 25 MG tablet Take 1 tablet (25 mg total) by mouth at bedtime. 01/24/22   [name], MD  pantoprazole (PROTONIX) 40 MG tablet Take 1 tablet (40 mg total) by mouth daily at 12 noon. 03/02/22   [name], MD  REPATHA SURECLICK [id number] MG/ML SOAJ INJECT 1 ML INTO THE SKIN EVERY 14 (FOURTEEN) DAYS. 02/05/21   Patwardhan, Reynold Bowen, MD  venlafaxine XR (EFFEXOR-XR) 150 MG 24 hr capsule Take 1 capsule (150 mg total) by mouth daily. 02/27/20   Bary Leriche, PA-C  Past Surgical History Past Surgical History:  Procedure Laterality Date   ABDOMINAL HYSTERECTOMY     AV FISTULA PLACEMENT Left 12/18/2017   Procedure: ARTERIOVENOUS (AV) FISTULA CREATION ARM;  Surgeon: Marty Heck, MD;  Location: [facility] OR;  Service: Vascular;  Laterality: Left;   bilateral cataract surgery     CARDIAC CATHETERIZATION  01/07/2014   DR Einar Gip   COLON RESECTION  02/1999   COLONOSCOPY     COLOSTOMY  02/1999   COLOSTOMY CLOSURE  07/1999   EYE SURGERY Left 2019   FISTULA SUPERFICIALIZATION Left 06/29/2018   Procedure: FISTULA SUPERFICIALIZATION LEFT BRACHIOCEPHALIC;  Surgeon: Marty Heck, MD;  Location: [facility];  Service: Vascular;  Laterality: Left;   LEFT HEART CATHETERIZATION WITH CORONARY ANGIOGRAM N/A 01/07/2014   Procedure: LEFT HEART CATHETERIZATION WITH CORONARY ANGIOGRAM;  Surgeon: Laverda Page,  MD;  Location: [facility] CATH LAB;  Service: Cardiovascular;  Laterality: N/A;   LOOP RECORDER INSERTION N/A 02/17/2020   Procedure: LOOP RECORDER INSERTION;  Surgeon: Deboraha Sprang, MD;  Location: [place] [facility] CV LAB;  Service: Cardiovascular;  Laterality: N/A;   middle cerebral artery stent placement Right    OTHER SURGICAL HISTORY     laser surgery   PTCA  01/07/2014   DES to RCA    DR Einar Gip   right knee surgery Right    for infection   RIGHT/LEFT HEART CATH AND CORONARY ANGIOGRAPHY N/A 10/23/2018   Procedure: RIGHT/LEFT HEART CATH AND CORONARY ANGIOGRAPHY;  Surgeon: Nigel Mormon, MD;  Location: [facility] CV LAB;  Service: Cardiovascular;  Laterality: N/A;   TEE WITHOUT CARDIOVERSION N/A 11/13/2018   Procedure: TRANSESOPHAGEAL ECHOCARDIOGRAM (TEE);  Surgeon: Burnell Blanks, MD;  Location: [facility];  Service: Open Heart Surgery;  Laterality: N/A;   TRANSCATHETER AORTIC VALVE REPLACEMENT, TRANSFEMORAL  11/13/2018   TRANSCATHETER AORTIC VALVE REPLACEMENT, TRANSFEMORAL N/A 11/13/2018   Procedure: TRANSCATHETER AORTIC VALVE REPLACEMENT, TRANSFEMORAL;  Surgeon: Burnell Blanks, MD;  Location: [facility] [place];  Service: Open Heart Surgery;  Laterality: N/A;   Family History Family History  Problem Relation Age of Onset   Diabetes Mother    Heart disease Mother    Prostate cancer Father    Hypertension Brother    Prostate cancer Brother     Social History Social History   Tobacco Use   Smoking status: Never   Smokeless tobacco: Never  Vaping Use   Vaping Use: Never used  Substance Use Topics   Alcohol use: No   Drug use: No   Allergies Patient has no known allergies.  Review of Systems Review of Systems  All other systems reviewed and are negative.   Physical Exam Vital Signs  I have reviewed the triage vital signs BP (!) 116/43   Pulse (!) 57   Temp 97.6 F (36.4 C) (Oral)   Resp 15   Ht 5\' 2"  (1.575 m)   Wt 56.7 kg   SpO2 100%   BMI 22.86 kg/m  Physical  Exam Vitals and nursing note reviewed.  Constitutional:      General: She is not in acute distress.    Appearance: She is well-developed.  HENT:     Head: Normocephalic and atraumatic.     Mouth/Throat:     Mouth: Mucous membranes are moist.  Eyes:     Pupils: Pupils are equal, round, and reactive to light.  Cardiovascular:     Rate and Rhythm: Normal rate and regular rhythm.     Heart sounds: No murmur  heard. Pulmonary:     Effort: Pulmonary effort is normal. No respiratory distress.     Breath sounds: Normal breath sounds.  Abdominal:     General: Abdomen is flat.     Palpations: Abdomen is soft.     Tenderness: There is no abdominal tenderness.  Musculoskeletal:        General: No tenderness.     Right lower leg: No edema.     Left lower leg: No edema.     Comments: LUE fistula w/ thrill  Skin:    General: Skin is warm and dry.  Neurological:     General: No focal deficit present.     Mental Status: She is alert. Mental status is at baseline.  Psychiatric:        Mood and Affect: Mood normal.        Behavior: Behavior normal.     ED Results and Treatments Labs (all labs ordered are listed, but only abnormal results are displayed) Labs Reviewed  COMPREHENSIVE METABOLIC PANEL - Abnormal; Notable for the following components:      Result Value   Sodium 132 (*)    Chloride 95 (*)    Glucose, Bld 144 (*)    BUN 62 (*)    Creatinine, Ser 4.46 (*)    Total Protein 6.3 (*)    Albumin 3.2 (*)    GFR, Estimated 9 (*)    All other components within normal limits  CBC WITH DIFFERENTIAL/PLATELET - Abnormal; Notable for the following components:   Hemoglobin 11.8 (*)    HCT 35.4 (*)    All other components within normal limits  LIPASE, BLOOD - Abnormal; Notable for the following components:   Lipase 57 (*)    All other components within normal limits  TROPONIN I (HIGH SENSITIVITY) - Abnormal; Notable for the following components:   Troponin I (High Sensitivity) 51 (*)     All other components within normal limits  PROTIME-INR  TROPONIN I (HIGH SENSITIVITY)                                                                                                                          Radiology DG Chest Port 1 View  Result Date: 04/10/2022 CLINICAL DATA:  Chest pain. EXAM: PORTABLE CHEST 1 VIEW COMPARISON:  March 01, 2022. FINDINGS: Stable cardiomediastinal silhouette. Status post transcatheter aortic valve repair. Both lungs are clear. The visualized skeletal structures are unremarkable. IMPRESSION: No active disease. Electronically Signed   By: Marijo Conception [name]   On: 04/10/2022 13:58    Pertinent labs & imaging results that were available during my care of the patient were reviewed by me and considered in my medical decision making (see MDM for details).  Medications Ordered in ED Medications  alum & mag hydroxide-simeth (MAALOX/MYLANTA) 200-200-20 MG/5ML suspension 30 mL (30 mLs Oral Given 04/10/22 1447)  famotidine (PEPCID) IVPB 20 mg premix (0 mg Intravenous Stopped 04/10/22 1523)  Procedures Procedures  (including critical care time)  Medical Decision Making / ED Course   MDM:  82 year old female presenting to the emergency department with chest pain.  Patient's symptoms most consistent with GERD, began after drinking coffee, reports radiating upward with belching sensation.  Low concern for ACS, EKG appears similar to previous, however patient does have coronary artery disease will check troponin, given dialysis likely x 2 as it will likely be somewhat elevated.  Less likely pulmonary embolism, no leg pain or swelling, no pleuritic pain, no shortness of breath, tachycardia or hypoxia.  Doubt dissection, chest x-ray stable, does not radiate, mild onset.  Doubt esophageal perforation, no nausea or vomiting.  Will give  medications for reflux and reassess.  Clinical Course as of 04/10/22 1603  Sun Apr 10, 2022  1559 Signed out to Dr. Alvino Chapel pending labs, delta troponin.  [WS]    Clinical Course User Index [WS] Cristie Hem, MD     Additional history obtained: -Additional history obtained from family -External records from outside source obtained and reviewed including: Chart review including previous notes, labs, imaging, consultation notes including recent cardiology note   Lab Tests: -I ordered, reviewed, and interpreted labs.   The pertinent results include:   Labs Reviewed  COMPREHENSIVE METABOLIC PANEL - Abnormal; Notable for the following components:      Result Value   Sodium 132 (*)    Chloride 95 (*)    Glucose, Bld 144 (*)    BUN 62 (*)    Creatinine, Ser 4.46 (*)    Total Protein 6.3 (*)    Albumin 3.2 (*)    GFR, Estimated 9 (*)    All other components within normal limits  CBC WITH DIFFERENTIAL/PLATELET - Abnormal; Notable for the following components:   Hemoglobin 11.8 (*)    HCT 35.4 (*)    All other components within normal limits  LIPASE, BLOOD - Abnormal; Notable for the following components:   Lipase 57 (*)    All other components within normal limits  TROPONIN I (HIGH SENSITIVITY) - Abnormal; Notable for the following components:   Troponin I (High Sensitivity) 51 (*)    All other components within normal limits  PROTIME-INR  TROPONIN I (HIGH SENSITIVITY)    Notable for troponin elevation, similar to baseline, evidence of ESRD, minimal elevated lipase  EKG   EKG Interpretation  Date/Time:  Sunday April 10 2022 12:45:36 EDT Ventricular Rate:  57 PR Interval:    QRS Duration: 86 QT Interval:  447 QTC Calculation: 436 R Axis:   14 Text Interpretation: Sinus bradycardia Borderline T abnormalities, diffuse leads No significant change since last tracing Confirmed by Garnette Gunner [id number]) on 04/10/2022 12:47:55 PM         Imaging Studies  ordered: I ordered imaging studies including CXR On my interpretation imaging demonstrates no acute process I independently visualized and interpreted imaging. I agree with the radiologist interpretation   Medicines ordered and prescription drug management: Meds ordered this encounter  Medications   alum & mag hydroxide-simeth (MAALOX/MYLANTA) 200-200-20 MG/5ML suspension 30 mL   famotidine (PEPCID) IVPB 20 mg premix    -I have reviewed the patients home medicines and have made adjustments as needed    Cardiac Monitoring: The patient was maintained on a cardiac monitor.  I personally viewed and interpreted the cardiac monitored which showed an underlying rhythm of: NSR    Reevaluation: After the interventions noted above, I reevaluated the patient and found that their symptoms have  improved  Co morbidities that complicate the patient evaluation  Past Medical History:  Diagnosis Date   Arthritis    Cerebrovascular disease    CHF (congestive heart failure) ([place])    CKD (chronic kidney disease)    Sees Dr Hollie Salk   Coronary artery disease    Depression    Diabetes ([place])    INSULIN DEPENDENT   Diabetic peripheral neuropathy (HCC)    Diabetic retinopathy ([place])    Diverticulitis    GERD (gastroesophageal reflux disease)    History of CVA (cerebrovascular accident)    x 2 no residulal   Hyperlipidemia    Hypertension    Obesity    Renal lesion    S/P TAVR (transcatheter aortic valve replacement) 11/13/2018   s/p TAVR with a 19mm Edwards S3U via the TF approach by Dr. [name] and Dr. Angelena Form   Severe aortic stenosis       Dispostion: Disposition decision including need for hospitalization was considered, and patient disposition pending at time of sign out.    Final Clinical Impression(s) / ED Diagnoses Final diagnoses:  Atypical chest pain     This chart was dictated using voice recognition software.  Despite best efforts to proofread,  errors can occur which can  change the documentation meaning.    Cristie Hem, MD 04/10/22 [phone]

## 2022-04-10 NOTE — ED Notes (Signed)
Secondary RN reviewed discharge instructions with patient. She verbalized understanding and denied any further questions. PT well appearing upon discharge and reports tolerable pain. Pt wheeled to exit. Pt endorses ride home.

## 2022-04-10 NOTE — ED Triage Notes (Signed)
To ED by [facility]- c/o generalized malaise x 2 days with headaches- no headache now, woke up this morning with chest pain, per EMS received ASA 324mg , NTG x1- which relieved pain.  Also walked down stairs which seemed to reduce pain according to EMS.   IV 20G in right forearm per EMS

## 2022-06-02 ENCOUNTER — Emergency Department (HOSPITAL_COMMUNITY): Payer: Medicare PPO

## 2022-06-02 ENCOUNTER — Emergency Department (HOSPITAL_COMMUNITY)
Admission: EM | Admit: 2022-06-02 | Discharge: 2022-06-02 | Disposition: A | Discharge status: Home / Self Care | Payer: Medicare PPO | Attending: Emergency Medicine | Admitting: Emergency Medicine

## 2022-06-02 DIAGNOSIS — R209 Unspecified disturbances of skin sensation: Secondary | ICD-10-CM | POA: Insufficient documentation

## 2022-06-02 DIAGNOSIS — E1165 Type 2 diabetes mellitus with hyperglycemia: Secondary | ICD-10-CM | POA: Diagnosis not present

## 2022-06-02 DIAGNOSIS — R519 Headache, unspecified: Secondary | ICD-10-CM | POA: Diagnosis not present

## 2022-06-02 DIAGNOSIS — Z7982 Long term (current) use of aspirin: Secondary | ICD-10-CM | POA: Diagnosis not present

## 2022-06-02 DIAGNOSIS — Z79899 Other long term (current) drug therapy: Secondary | ICD-10-CM | POA: Diagnosis not present

## 2022-06-02 DIAGNOSIS — Z992 Dependence on renal dialysis: Secondary | ICD-10-CM | POA: Diagnosis not present

## 2022-06-02 DIAGNOSIS — R531 Weakness: Secondary | ICD-10-CM

## 2022-06-02 DIAGNOSIS — Z794 Long term (current) use of insulin: Secondary | ICD-10-CM | POA: Insufficient documentation

## 2022-06-02 DIAGNOSIS — N186 End stage renal disease: Secondary | ICD-10-CM | POA: Insufficient documentation

## 2022-06-02 DIAGNOSIS — I132 Hypertensive heart and chronic kidney disease with heart failure and with stage 5 chronic kidney disease, or end stage renal disease: Secondary | ICD-10-CM | POA: Insufficient documentation

## 2022-06-02 DIAGNOSIS — E1122 Type 2 diabetes mellitus with diabetic chronic kidney disease: Secondary | ICD-10-CM | POA: Diagnosis not present

## 2022-06-02 DIAGNOSIS — I509 Heart failure, unspecified: Secondary | ICD-10-CM | POA: Diagnosis not present

## 2022-06-02 LAB — COMPREHENSIVE METABOLIC PANEL
ALT: 16 U/L (ref 0–44)
AST: 18 U/L (ref 15–41)
Albumin: 3.3 g/dL — ABNORMAL LOW (ref 3.5–5.0)
Alkaline Phosphatase: 108 U/L (ref 38–126)
Anion gap: 11 (ref 5–15)
BUN: 34 mg/dL — ABNORMAL HIGH (ref 8–23)
CO2: 29 mmol/L (ref 22–32)
Calcium: 9.7 mg/dL (ref 8.9–10.3)
Chloride: 95 mmol/L — ABNORMAL LOW (ref 98–111)
Creatinine, Ser: 3.02 mg/dL — ABNORMAL HIGH (ref 0.44–1.00)
GFR, Estimated: 15 mL/min — ABNORMAL LOW (ref >60)
Glucose, Bld: 216 mg/dL — ABNORMAL HIGH (ref 70–99)
Potassium: 3.8 mmol/L (ref 3.5–5.1)
Sodium: 135 mmol/L (ref 135–145)
Total Bilirubin: 0.3 mg/dL (ref 0.3–1.2)
Total Protein: 7 g/dL (ref 6.5–8.1)

## 2022-06-02 LAB — CBC WITH DIFFERENTIAL/PLATELET
Abs Immature Granulocytes: 0.03 10*3/uL (ref 0.00–0.07)
Basophils Absolute: 0.1 10*3/uL (ref 0.0–0.1)
Basophils Relative: 1 %
Eosinophils Absolute: 0.6 10*3/uL — ABNORMAL HIGH (ref 0.0–0.5)
Eosinophils Relative: 7 %
HCT: 38 % (ref 36.0–46.0)
Hemoglobin: 12.2 g/dL (ref 12.0–15.0)
Immature Granulocytes: 0 %
Lymphocytes Relative: 26 %
Lymphs Abs: 2.4 10*3/uL (ref 0.7–4.0)
MCH: 29.5 pg (ref 26.0–34.0)
MCHC: 32.1 g/dL (ref 30.0–36.0)
MCV: 92 fL (ref 80.0–100.0)
Monocytes Absolute: 1 10*3/uL (ref 0.1–1.0)
Monocytes Relative: 11 %
Neutro Abs: 5.1 10*3/uL (ref 1.7–7.7)
Neutrophils Relative %: 55 %
Platelets: 227 10*3/uL (ref 150–400)
RBC: 4.13 MIL/uL (ref 3.87–5.11)
RDW: 14 % (ref 11.5–15.5)
WBC: 9.1 10*3/uL (ref 4.0–10.5)
nRBC: 0 % (ref 0.0–0.2)

## 2022-06-02 LAB — URINALYSIS, ROUTINE W REFLEX MICROSCOPIC
Bilirubin Urine: NEGATIVE
Glucose, UA: 150 mg/dL — AB
Ketones, ur: NEGATIVE mg/dL
Nitrite: NEGATIVE
Protein, ur: >=300 mg/dL — AB
Specific Gravity, Urine: 1.015 (ref 1.005–1.030)
pH: 6 (ref 5.0–8.0)

## 2022-06-02 LAB — AMMONIA: Ammonia: 17 umol/L (ref 9–35)

## 2022-06-02 LAB — TROPONIN I (HIGH SENSITIVITY)
Troponin I (High Sensitivity): 51 ng/L — ABNORMAL HIGH (ref <18)
Troponin I (High Sensitivity): 52 ng/L — ABNORMAL HIGH (ref <18)

## 2022-06-02 LAB — MAGNESIUM: Magnesium: 2 mg/dL (ref 1.7–2.4)

## 2022-06-02 LAB — CBG MONITORING, ED: Glucose-Capillary: 176 mg/dL — ABNORMAL HIGH (ref 70–99)

## 2022-06-02 MED ORDER — METOCLOPRAMIDE HCL 10 MG PO TABS: 10.0000 mg | ORAL_TABLET | Freq: Four times a day (QID) | ORAL | 0 refills | Status: DC

## 2022-06-02 MED ORDER — HYDRALAZINE HCL 25 MG PO TABS
75.0000 mg | ORAL_TABLET | Freq: Once | ORAL | Status: DC
  Filled 2022-06-02: qty 3

## 2022-06-02 MED ORDER — HYDRALAZINE HCL 25 MG PO TABS
100.0000 mg | ORAL_TABLET | ORAL | Status: AC
  Administered 2022-06-02: 100 mg via ORAL
  Filled 2022-06-02: qty 4

## 2022-06-02 NOTE — ED Provider Notes (Incomplete)
[place] EMERGENCY DEPARTMENT AT [facility] Provider Note   CSN: [id number] Arrival date & time: 06/02/22  1450     History {Add pertinent medical, surgical, social history, OB history to HPI:1} Chief Complaint  Patient presents with   Weakness   Hyperglycemia   Numbness   Tingling         [name] is a 82 y.o. female.  82 year old female with a history of stroke without residual deficits.  Aortic stenosis status post TAVR, CHF, ESRD on Monday Wednesday Friday dialysis, HTN, HLD, and insulin-dependent diabetes who presents to the emergency department with generalized weakness, headache, and tingling.  Patient reports that over the past week she has felt generally weak.  Denies any chest pain or shortness of breath.  No cough, congestion, or runny nose.  Since yesterday has been having a right-sided occipital headache that she has difficulty characterizing.  Says it has been constant.  Did take some Tylenol which did not resolve her symptoms.  Reports that she had physical therapy today and then showered and afterwards was transiently confused for short period of time.  Has returned to baseline at this time.  Also saying that she has some tingling on the bottom of her left foot and of the fingers of her left hand but denies any additional weakness, numbness, or vision changes.  No slurred speech.       Home Medications Prior to Admission medications   Medication Sig Start Date End Date Taking? Authorizing Provider  Acetaminophen 500 MG capsule Take 500 mg by mouth every 6 (six) hours as needed for moderate pain or headache.    [provider]  albuterol (VENTOLIN HFA) 108 (90 Base) MCG/ACT inhaler Inhale 2 puffs into the lungs every 6 (six) hours as needed for wheezing or shortness of breath. 01/24/22   Meredeth Ide, MD  allopurinol (ZYLOPRIM) 100 MG tablet Take 50 mg by mouth daily. Patient not taking: Reported on 04/07/2022    [provider]   amoxicillin (AMOXIL) 500 MG tablet TAKE 4 TABS AS DIRECTED 30-60 MIN BEFORE DENTAL PROCEDURE Patient not taking: Reported on 04/07/2022 03/08/22   Elder Negus, MD  aspirin EC 81 MG tablet Take 81 mg by mouth daily.    [provider]  brimonidine (ALPHAGAN) 0.2 % ophthalmic solution Place 1 drop into the left eye daily. 04/19/19   [provider]  folic acid (FOLVITE) 1 MG tablet Take 1 mg by mouth daily.    [provider]  HUMALOG KWIKPEN 100 UNIT/ML KwikPen Inject 1-5 Units into the skin See admin instructions. Inject 1-5 units 3 times daily with meals and at bedtime. Dose depends on what pt is eating. 05/28/18   [provider]  hydrALAZINE (APRESOLINE) 100 MG tablet Take 100 mg by mouth 3 (three) times daily. 08/09/21   [provider]  Insulin Degludec (TRESIBA) 100 UNIT/ML SOLN Inject 10 Units into the skin at bedtime. 01/24/22   Meredeth Ide, MD  isosorbide mononitrate (IMDUR) 120 MG 24 hr tablet TAKE 2 TABLETS BY MOUTH DAILY 03/04/22   Patwardhan, Manish J, MD  labetalol (NORMODYNE) 200 MG tablet TAKE 1 TABLET BY MOUTH TWICE A DAY 11/02/21   Patwardhan, Manish J, MD  losartan (COZAAR) 25 MG tablet Take 1 tablet (25 mg total) by mouth at bedtime. 01/24/22   Meredeth Ide, MD  pantoprazole (PROTONIX) 40 MG tablet Take 1 tablet (40 mg total) by mouth daily at 12 noon. 03/02/22  Sharl Ma, Sarina Ill, MD  REPATHA SURECLICK 140 MG/ML SOAJ INJECT 1 ML INTO THE SKIN EVERY 14 (FOURTEEN) DAYS. 02/05/21   Patwardhan, Anabel Bene, MD  venlafaxine XR (EFFEXOR-XR) 150 MG 24 hr capsule Take 1 capsule (150 mg total) by mouth daily. 02/27/20   Jacquelynn Cree, PA-C      Allergies    Patient has no known allergies.    Review of Systems   Review of Systems  Physical Exam Updated Vital Signs BP (!) 187/111 (BP Location: Right Arm)   Pulse (!) 54   Temp 98.1 F (36.7 C) (Oral)   Resp 20   SpO2 100%  Physical Exam Vitals and nursing note reviewed.  Constitutional:       General: She is not in acute distress.    Appearance: She is well-developed.  HENT:     Head: Normocephalic and atraumatic.     Right Ear: External ear normal.     Left Ear: External ear normal.     Nose: Nose normal.  Eyes:     Extraocular Movements: Extraocular movements intact.     Conjunctiva/sclera: Conjunctivae normal.     Comments: Irregular iris on left side from cataract surgery  Cardiovascular:     Rate and Rhythm: Normal rate and regular rhythm.     Heart sounds: No murmur heard.    Comments: Fistula in left upper extremity Pulmonary:     Effort: Pulmonary effort is normal. No respiratory distress.  Abdominal:     General: Abdomen is flat.  Musculoskeletal:     Cervical back: Normal range of motion and neck supple.     Right lower leg: No edema.     Left lower leg: No edema.  Skin:    General: Skin is warm and dry.  Neurological:     Mental Status: She is alert and oriented to person, place, and time. Mental status is at baseline.     Comments: MENTAL STATUS: AAOx3 CRANIAL NERVES: II: Left pupil irregular after cataract surgery, no RAPD, no VF deficits III, IV, VI: EOM intact, no gaze preference or deviation, no nystagmus. V: normal sensation to light touch in V1, V2, and V3 segments bilaterally VII: no facial weakness or asymmetry, no nasolabial fold flattening VIII: normal hearing to speech and Pelfrey friction IX, X: normal palatal elevation, no uvular deviation XI: 5/5 head turn and 5/5 shoulder shrug bilaterally XII: midline tongue protrusion MOTOR: 5/5 strength in R shoulder flexion, elbow flexion and extension, and grip strength. 5/5 strength in L shoulder flexion, elbow flexion and extension, and grip strength.  5/5 strength in R hip and knee flexion, knee extension, ankle plantar and dorsiflexion. 5/5 strength in L hip and knee flexion, knee extension, ankle plantar and dorsiflexion. SENSORY: Normal sensation to light touch in all extremities COORD:  Normal Rhody to nose and heel to shin, no tremor, no dysmetria STATION: no truncal ataxia  Psychiatric:        Mood and Affect: Mood normal.     ED Results / Procedures / Treatments   Labs (all labs ordered are listed, but only abnormal results are displayed) Labs Reviewed  CBC WITH DIFFERENTIAL/PLATELET  COMPREHENSIVE METABOLIC PANEL  MAGNESIUM  URINALYSIS, ROUTINE W REFLEX MICROSCOPIC  AMMONIA  BLOOD GAS, VENOUS  CBG MONITORING, ED    EKG None  Radiology No results found.  Procedures Procedures  {Document cardiac monitor, telemetry assessment procedure when appropriate:1} Ultrasound Guided Peripheral IV Indication: difficult to access - nursing staff unable to secure  adequate peripheral IV access Location: R Antecubital Catheter Size: 20 G  Static views used to identify the target vein then usual prep with Chloraprep. IV placed on first attempt under dynamic US guidance. Dark red flash noted, and catheter advanced smoothly into the vein. NS saline flushed without resistance, witnessed swelling, or patient discomfort. IV secured with I-site and tegaderm. The patient tolerated the procedure well. Performed By: Vonita Moss MD   Medications Ordered in ED Medications - No data to display  ED Course/ Medical Decision Making/ A&P   {   Click here for ABCD2, HEART and other calculatorsREFRESH Note before signing :1}                          Medical Decision Making Amount and/or Complexity of Data Reviewed Radiology: ordered.  Risk Prescription drug management.   ***  {Document critical care time when appropriate:1} {Document review of labs and clinical decision tools ie heart score, Chads2Vasc2 etc:1}  {Document your independent review of radiology images, and any outside records:1} {Document your discussion with family members, caretakers, and with consultants:1} {Document social determinants of health affecting pt's care:1} {Document your decision making  why or why not admission, treatments were needed:1} Final Clinical Impression(s) / ED Diagnoses Final diagnoses:  None    Rx / DC Orders ED Discharge Orders     None

## 2022-06-02 NOTE — ED Triage Notes (Addendum)
Patient BIB EMS from home, x1wk decrease energy and headaches. Periods of confusion. Left side numbness and tingling. Blood sugars 300"s all week. CBG 348. Dialysis patient M/W/F

## 2022-06-02 NOTE — ED Notes (Signed)
CBG 266 @ 3:50 PM

## 2022-06-02 NOTE — ED Notes (Signed)
IV team at bedside 

## 2022-06-02 NOTE — Discharge Instructions (Signed)
You were seen for your headache and generalized weakness in the emergency department.   At home, please take tylenol for your headache. You may also take the reglan we have prescribed you for your headache.  Check your MyChart online for the results of any tests that had not resulted by the time you left the emergency department.   Follow-up with your primary doctor in 2-3 days regarding your visit.  Follow-up with your neurologist about your headache as well.   Return immediately to the emergency department if you experience any of the following: slurred speech, weakness or numbness of your arms or legs, or any other concerning symptoms.    Thank you for visiting our Emergency Department. It was a pleasure taking care of you today.

## 2022-09-15 ENCOUNTER — Ambulatory Visit (INDEPENDENT_AMBULATORY_CARE_PROVIDER_SITE_OTHER): Payer: Medicare PPO | Admitting: Podiatry

## 2022-09-15 DIAGNOSIS — Z91199 Patient's noncompliance with other medical treatment and regimen due to unspecified reason: Secondary | ICD-10-CM

## 2022-09-15 NOTE — Progress Notes (Signed)
Pt was a no show for apt 

## 2022-10-11 ENCOUNTER — Ambulatory Visit: Payer: Medicare PPO | Admitting: Cardiology

## 2022-10-13 ENCOUNTER — Ambulatory Visit: Payer: Medicare PPO | Admitting: Cardiology

## 2022-12-12 ENCOUNTER — Emergency Department (HOSPITAL_COMMUNITY): Payer: Medicare PPO

## 2022-12-12 ENCOUNTER — Other Ambulatory Visit: Payer: Self-pay

## 2022-12-12 ENCOUNTER — Observation Stay (HOSPITAL_COMMUNITY)
Admission: EM | Admit: 2022-12-12 | Discharge: 2022-12-14 | Disposition: A | Discharge status: Home / Self Care | Payer: Medicare PPO | Attending: Family Medicine | Admitting: Family Medicine

## 2022-12-12 ENCOUNTER — Encounter (HOSPITAL_COMMUNITY): Payer: Self-pay | Admitting: Emergency Medicine

## 2022-12-12 DIAGNOSIS — E1140 Type 2 diabetes mellitus with diabetic neuropathy, unspecified: Secondary | ICD-10-CM | POA: Diagnosis not present

## 2022-12-12 DIAGNOSIS — E1122 Type 2 diabetes mellitus with diabetic chronic kidney disease: Secondary | ICD-10-CM | POA: Insufficient documentation

## 2022-12-12 DIAGNOSIS — Z8673 Personal history of transient ischemic attack (TIA), and cerebral infarction without residual deficits: Secondary | ICD-10-CM | POA: Diagnosis not present

## 2022-12-12 DIAGNOSIS — N186 End stage renal disease: Principal | ICD-10-CM

## 2022-12-12 DIAGNOSIS — G40209 Localization-related (focal) (partial) symptomatic epilepsy and epileptic syndromes with complex partial seizures, not intractable, without status epilepticus: Principal | ICD-10-CM | POA: Insufficient documentation

## 2022-12-12 DIAGNOSIS — I10 Essential (primary) hypertension: Secondary | ICD-10-CM | POA: Diagnosis present

## 2022-12-12 DIAGNOSIS — Z794 Long term (current) use of insulin: Secondary | ICD-10-CM | POA: Insufficient documentation

## 2022-12-12 DIAGNOSIS — E113599 Type 2 diabetes mellitus with proliferative diabetic retinopathy without macular edema, unspecified eye: Secondary | ICD-10-CM | POA: Insufficient documentation

## 2022-12-12 DIAGNOSIS — H26491 Other secondary cataract, right eye: Secondary | ICD-10-CM

## 2022-12-12 DIAGNOSIS — Z952 Presence of prosthetic heart valve: Secondary | ICD-10-CM | POA: Diagnosis not present

## 2022-12-12 DIAGNOSIS — I2510 Atherosclerotic heart disease of native coronary artery without angina pectoris: Secondary | ICD-10-CM | POA: Diagnosis present

## 2022-12-12 DIAGNOSIS — Z7982 Long term (current) use of aspirin: Secondary | ICD-10-CM | POA: Insufficient documentation

## 2022-12-12 DIAGNOSIS — R531 Weakness: Secondary | ICD-10-CM | POA: Diagnosis present

## 2022-12-12 DIAGNOSIS — I509 Heart failure, unspecified: Secondary | ICD-10-CM | POA: Insufficient documentation

## 2022-12-12 DIAGNOSIS — R030 Elevated blood-pressure reading, without diagnosis of hypertension: Secondary | ICD-10-CM

## 2022-12-12 DIAGNOSIS — Z79899 Other long term (current) drug therapy: Secondary | ICD-10-CM | POA: Diagnosis not present

## 2022-12-12 DIAGNOSIS — R569 Unspecified convulsions: Secondary | ICD-10-CM

## 2022-12-12 DIAGNOSIS — Z992 Dependence on renal dialysis: Secondary | ICD-10-CM | POA: Diagnosis not present

## 2022-12-12 DIAGNOSIS — I132 Hypertensive heart and chronic kidney disease with heart failure and with stage 5 chronic kidney disease, or end stage renal disease: Secondary | ICD-10-CM | POA: Insufficient documentation

## 2022-12-12 DIAGNOSIS — E785 Hyperlipidemia, unspecified: Secondary | ICD-10-CM | POA: Insufficient documentation

## 2022-12-12 DIAGNOSIS — E1129 Type 2 diabetes mellitus with other diabetic kidney complication: Secondary | ICD-10-CM | POA: Diagnosis present

## 2022-12-12 LAB — COMPREHENSIVE METABOLIC PANEL
ALT: 15 U/L (ref 0–44)
AST: 26 U/L (ref 15–41)
Albumin: 3.2 g/dL — ABNORMAL LOW (ref 3.5–5.0)
Alkaline Phosphatase: 69 U/L (ref 38–126)
Anion gap: 13 (ref 5–15)
BUN: 27 mg/dL — ABNORMAL HIGH (ref 8–23)
CO2: 25 mmol/L (ref 22–32)
Calcium: 9 mg/dL (ref 8.9–10.3)
Chloride: 96 mmol/L — ABNORMAL LOW (ref 98–111)
Creatinine, Ser: 4.33 mg/dL — ABNORMAL HIGH (ref 0.44–1.00)
GFR, Estimated: 10 mL/min — ABNORMAL LOW (ref >60)
Glucose, Bld: 292 mg/dL — ABNORMAL HIGH (ref 70–99)
Potassium: 3.8 mmol/L (ref 3.5–5.1)
Sodium: 134 mmol/L — ABNORMAL LOW (ref 135–145)
Total Bilirubin: 0.5 mg/dL (ref <1.2)
Total Protein: 6.3 g/dL — ABNORMAL LOW (ref 6.5–8.1)

## 2022-12-12 LAB — PROTIME-INR
INR: 1 (ref 0.8–1.2)
Prothrombin Time: 13.5 s (ref 11.4–15.2)

## 2022-12-12 LAB — DIFFERENTIAL
Abs Immature Granulocytes: 0.03 10*3/uL (ref 0.00–0.07)
Basophils Absolute: 0.1 10*3/uL (ref 0.0–0.1)
Basophils Relative: 1 %
Eosinophils Absolute: 0.2 10*3/uL (ref 0.0–0.5)
Eosinophils Relative: 2 %
Immature Granulocytes: 0 %
Lymphocytes Relative: 25 %
Lymphs Abs: 2.5 10*3/uL (ref 0.7–4.0)
Monocytes Absolute: 0.6 10*3/uL (ref 0.1–1.0)
Monocytes Relative: 6 %
Neutro Abs: 6.7 10*3/uL (ref 1.7–7.7)
Neutrophils Relative %: 66 %

## 2022-12-12 LAB — I-STAT CHEM 8, ED
BUN: 28 mg/dL — ABNORMAL HIGH (ref 8–23)
Calcium, Ion: 1.08 mmol/L — ABNORMAL LOW (ref 1.15–1.40)
Chloride: 97 mmol/L — ABNORMAL LOW (ref 98–111)
Creatinine, Ser: 4.4 mg/dL — ABNORMAL HIGH (ref 0.44–1.00)
Glucose, Bld: 280 mg/dL — ABNORMAL HIGH (ref 70–99)
HCT: 36 % (ref 36.0–46.0)
Hemoglobin: 12.2 g/dL (ref 12.0–15.0)
Potassium: 3.6 mmol/L (ref 3.5–5.1)
Sodium: 134 mmol/L — ABNORMAL LOW (ref 135–145)
TCO2: 25 mmol/L (ref 22–32)

## 2022-12-12 LAB — CBC
HCT: 34.8 % — ABNORMAL LOW (ref 36.0–46.0)
Hemoglobin: 11.1 g/dL — ABNORMAL LOW (ref 12.0–15.0)
MCH: 29.8 pg (ref 26.0–34.0)
MCHC: 31.9 g/dL (ref 30.0–36.0)
MCV: 93.5 fL (ref 80.0–100.0)
Platelets: 374 10*3/uL (ref 150–400)
RBC: 3.72 MIL/uL — ABNORMAL LOW (ref 3.87–5.11)
RDW: 13.2 % (ref 11.5–15.5)
WBC: 10 10*3/uL (ref 4.0–10.5)
nRBC: 0 % (ref 0.0–0.2)

## 2022-12-12 LAB — ETHANOL: Alcohol, Ethyl (B): <10 mg/dL (ref <10)

## 2022-12-12 LAB — APTT: aPTT: 29 s (ref 24–36)

## 2022-12-12 MED ORDER — DIAZEPAM 5 MG/ML IJ SOLN
2.5000 mg | Freq: Once | INTRAMUSCULAR | Status: AC
  Administered 2022-12-12: 2.5 mg via INTRAVENOUS
  Filled 2022-12-12: qty 2

## 2022-12-12 MED ORDER — SODIUM CHLORIDE 0.9% FLUSH: 3.0000 mL | Freq: Once | INTRAVENOUS | Status: DC

## 2022-12-12 NOTE — ED Provider Triage Note (Signed)
Emergency Medicine Provider Triage Evaluation Note  MERIDETH LUPTAK , a 82 y.o. female  was evaluated in triage.  Pt complains of weakness. Patient reports left sided weakness and blurred vision at the right eye since last night. 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 Patient was evaluated by her PCP prior to arrival and instructed to come here for further work up.   Review of Systems  Positive: Left sided weakness Negative:   Physical Exam  BP (!) 130/55 (BP Location: Right Arm)   Pulse 82   Temp 98.3 F (36.8 C)   Resp 17   SpO2 100%  Gen:   Awake, no distress   Resp:  Normal effort  MSK:   Unable to lift left arm or extend left leg. Slight left sided facial droop. Other:    Medical Decision Making  Medically screening exam initiated at 4:47 PM.  Appropriate orders placed.  Kierstyn Yeung Takeshita was informed that the remainder of the evaluation will be completed by another provider, this initial triage assessment does not replace that evaluation, and the importance of remaining in the ED until their evaluation is complete.   Maxwell Marion, PA-C 12/12/22 1652

## 2022-12-12 NOTE — ED Provider Notes (Signed)
[place] EMERGENCY DEPARTMENT AT [facility] Provider Note   CSN: [id number] Arrival date & time: 12/12/22  1600     History  Chief Complaint  Patient presents with   Weakness    [name] is a 82 y.o. female.  Patient with hx esrd/hd, had normal hd yesterday, c/o left arm and left weakness. States left arm started feeling funny last evening, and this AM when awoke indicates left leg also felt weak. No numbness. Denies headache. No neck or back pain. No chest pain or trouble breathing. No trauma/fall. Indicates had cva w left weakness twenty years ago, but symptoms resolved. No change in speech or vision.   The history is provided by the patient and medical records.  Weakness Associated symptoms: no abdominal pain, no chest pain, no fever, no headaches, no nausea, no shortness of breath and no vomiting        Home Medications Prior to Admission medications   Medication Sig Start Date End Date Taking? Authorizing Provider  Acetaminophen 500 MG capsule Take 500 mg by mouth every 6 (six) hours as needed for moderate pain or headache.    [provider]  albuterol (VENTOLIN HFA) 108 (90 Base) MCG/ACT inhaler Inhale 2 puffs into the lungs every 6 (six) hours as needed for wheezing or shortness of breath. 01/24/22   Meredeth Ide, MD  allopurinol (ZYLOPRIM) 100 MG tablet Take 50 mg by mouth daily. Patient not taking: Reported on 04/07/2022    [provider]  amoxicillin (AMOXIL) 500 MG tablet TAKE 4 TABS AS DIRECTED 30-60 MIN BEFORE DENTAL PROCEDURE Patient not taking: Reported on 04/07/2022 03/08/22   Elder Negus, MD  aspirin EC 81 MG tablet Take 81 mg by mouth daily.    [provider]  brimonidine (ALPHAGAN) 0.2 % ophthalmic solution Place 1 drop into the left eye daily. 04/19/19   [provider]  folic acid (FOLVITE) 1 MG tablet Take 1 mg by mouth daily.    [provider]  HUMALOG KWIKPEN 100 UNIT/ML KwikPen Inject  1-5 Units into the skin See admin instructions. Inject 1-5 units 3 times daily with meals and at bedtime. Dose depends on what pt is eating. 05/28/18   [provider]  hydrALAZINE (APRESOLINE) 100 MG tablet Take 100 mg by mouth 3 (three) times daily. 08/09/21   [provider]  Insulin Degludec (TRESIBA) 100 UNIT/ML SOLN Inject 10 Units into the skin at bedtime. 01/24/22   Meredeth Ide, MD  isosorbide mononitrate (IMDUR) 120 MG 24 hr tablet TAKE 2 TABLETS BY MOUTH DAILY 03/04/22   Patwardhan, Manish J, MD  labetalol (NORMODYNE) 200 MG tablet TAKE 1 TABLET BY MOUTH TWICE A DAY 11/02/21   Patwardhan, Manish J, MD  losartan (COZAAR) 25 MG tablet Take 1 tablet (25 mg total) by mouth at bedtime. 01/24/22   Meredeth Ide, MD  metoCLOPramide (REGLAN) 10 MG tablet Take 1 tablet (10 mg total) by mouth every 6 (six) hours. 06/02/22   Rondel Baton, MD  pantoprazole (PROTONIX) 40 MG tablet Take 1 tablet (40 mg total) by mouth daily at 12 noon. 03/02/22   Meredeth Ide, MD  REPATHA SURECLICK 140 MG/ML SOAJ INJECT 1 ML INTO THE SKIN EVERY 14 (FOURTEEN) DAYS. 02/05/21   Patwardhan, Anabel Bene, MD  venlafaxine XR (EFFEXOR-XR) 150 MG 24 hr capsule Take 1 capsule (150 mg total) by mouth daily. 02/27/20   Jacquelynn Cree, PA-C      Allergies  Patient has no known allergies.    Review of Systems   Review of Systems  Constitutional:  Negative for chills and fever.  HENT:  Negative for trouble swallowing.   Eyes:  Negative for visual disturbance.  Respiratory:  Negative for shortness of breath.   Cardiovascular:  Negative for chest pain.  Gastrointestinal:  Negative for abdominal pain, nausea and vomiting.  Genitourinary:  Negative for flank pain.  Musculoskeletal:  Negative for back pain and neck pain.  Skin:  Negative for rash.  Neurological:  Positive for weakness. Negative for headaches.  Hematological:  Does not bruise/bleed easily.  Psychiatric/Behavioral:  Negative for confusion.      Physical Exam Updated Vital Signs BP (!) 165/64 (BP Location: Right Arm)   Pulse 66   Temp 98.2 F (36.8 C) (Oral)   Resp 15   SpO2 100%  Physical Exam Vitals and nursing note reviewed.  Constitutional:      Appearance: Normal appearance. She is well-developed.  HENT:     Head: Atraumatic.     Nose: Nose normal.     Mouth/Throat:     Mouth: Mucous membranes are moist.  Eyes:     General: No scleral icterus.    Extraocular Movements: Extraocular movements intact.     Conjunctiva/sclera: Conjunctivae normal.     Pupils: Pupils are equal, round, and reactive to light.  Neck:     Vascular: No carotid bruit.     Trachea: No tracheal deviation.  Cardiovascular:     Rate and Rhythm: Normal rate and regular rhythm.     Pulses: Normal pulses.     Heart sounds: Normal heart sounds. No murmur heard.    No friction rub. No gallop.  Pulmonary:     Effort: Pulmonary effort is normal. No respiratory distress.     Breath sounds: Normal breath sounds.  Abdominal:     General: There is no distension.     Palpations: Abdomen is soft. There is no mass.     Tenderness: There is no abdominal tenderness.  Musculoskeletal:        General: No swelling.     Cervical back: Normal range of motion and neck supple. No rigidity or tenderness. No muscular tenderness.     Comments: CTLS spine, non tender, aligned, no step off. Good rom LLE without pain. LLE and LUE of normal color and warmth, with intact distal pulses.   Skin:    General: Skin is warm and dry.     Findings: No rash.  Neurological:     Mental Status: She is alert.     Comments: Alert, speech normal. ?left weakness, LUE 4/5, LLE 3/5.   Psychiatric:        Mood and Affect: Mood normal.     ED Results / Procedures / Treatments   Labs (all labs ordered are listed, but only abnormal results are displayed)  Results for orders placed or performed during the hospital encounter of 12/12/22  Protime-INR  Result Value Ref Range    Prothrombin Time 13.5 11.4 - 15.2 seconds   INR 1.0 0.8 - 1.2  APTT  Result Value Ref Range   aPTT 29 24 - 36 seconds  CBC  Result Value Ref Range   WBC 10.0 4.0 - 10.5 K/uL   RBC 3.72 (L) 3.87 - 5.11 MIL/uL   Hemoglobin 11.1 (L) 12.0 - 15.0 g/dL   HCT 84.6 (L) 96.2 - 95.2 %   MCV 93.5 80.0 - 100.0 fL   MCH  29.8 26.0 - 34.0 pg   MCHC 31.9 30.0 - 36.0 g/dL   RDW 21.3 08.6 - 57.8 %   Platelets 374 150 - 400 K/uL   nRBC 0.0 0.0 - 0.2 %  Differential  Result Value Ref Range   Neutrophils Relative % 66 %   Neutro Abs 6.7 1.7 - 7.7 K/uL   Lymphocytes Relative 25 %   Lymphs Abs 2.5 0.7 - 4.0 K/uL   Monocytes Relative 6 %   Monocytes Absolute 0.6 0.1 - 1.0 K/uL   Eosinophils Relative 2 %   Eosinophils Absolute 0.2 0.0 - 0.5 K/uL   Basophils Relative 1 %   Basophils Absolute 0.1 0.0 - 0.1 K/uL   Immature Granulocytes 0 %   Abs Immature Granulocytes 0.03 0.00 - 0.07 K/uL  Comprehensive metabolic panel  Result Value Ref Range   Sodium 134 (L) 135 - 145 mmol/L   Potassium 3.8 3.5 - 5.1 mmol/L   Chloride 96 (L) 98 - 111 mmol/L   CO2 25 22 - 32 mmol/L   Glucose, Bld 292 (H) 70 - 99 mg/dL   BUN 27 (H) 8 - 23 mg/dL   Creatinine, Ser 4.69 (H) 0.44 - 1.00 mg/dL   Calcium 9.0 8.9 - 62.9 mg/dL   Total Protein 6.3 (L) 6.5 - 8.1 g/dL   Albumin 3.2 (L) 3.5 - 5.0 g/dL   AST 26 15 - 41 U/L   ALT 15 0 - 44 U/L   Alkaline Phosphatase 69 38 - 126 U/L   Total Bilirubin 0.5 <1.2 mg/dL   GFR, Estimated 10 (L) >60 mL/min   Anion gap 13 5 - 15  Ethanol  Result Value Ref Range   Alcohol, Ethyl (B) <10 <10 mg/dL  I-stat chem 8, ED  Result Value Ref Range   Sodium 134 (L) 135 - 145 mmol/L   Potassium 3.6 3.5 - 5.1 mmol/L   Chloride 97 (L) 98 - 111 mmol/L   BUN 28 (H) 8 - 23 mg/dL   Creatinine, Ser 5.28 (H) 0.44 - 1.00 mg/dL   Glucose, Bld 413 (H) 70 - 99 mg/dL   Calcium, Ion 2.44 (L) 1.15 - 1.40 mmol/L   TCO2 25 22 - 32 mmol/L   Hemoglobin 12.2 12.0 - 15.0 g/dL   HCT 01.0 27.2 - 53.6 %    CT HEAD WO CONTRAST  Result Date: 12/12/2022 CLINICAL DATA:  Neuro deficit, acute, stroke suspected EXAM: CT HEAD WITHOUT CONTRAST TECHNIQUE: Contiguous axial images were obtained from the base of the skull through the vertex without intravenous contrast. RADIATION DOSE REDUCTION: This exam was performed according to the departmental dose-optimization program which includes automated exposure control, adjustment of the mA and/or kV according to patient size and/or use of iterative reconstruction technique. COMPARISON:  None Available. FINDINGS: Brain: Old right occipital and posterior frontal infarcts, unchanged. No acute intracranial abnormality. Specifically, no hemorrhage, hydrocephalus, mass lesion, acute infarction, or significant intracranial injury. Vascular: No hyperdense vessel or unexpected calcification. Skull: No acute calvarial abnormality. Sinuses/Orbits: No acute findings Other: None IMPRESSION: Old right side infarcts as above. No acute intracranial abnormality. Electronically Signed   By: Charlett Nose [name]   On: 12/12/2022 18:05     EKG EKG Interpretation Date/Time:  Monday December 12 2022 16:16:43 EST Ventricular Rate:  85 PR Interval:  196 QRS Duration:  90 QT Interval:  392 QTC Calculation: 466 R Axis:   8  Text Interpretation: Normal sinus rhythm Nonspecific T wave abnormality Confirmed by Cathren Laine ([id number])  on 12/12/2022 10:47:05 PM  Radiology CT HEAD WO CONTRAST  Result Date: 12/12/2022 CLINICAL DATA:  Neuro deficit, acute, stroke suspected EXAM: CT HEAD WITHOUT CONTRAST TECHNIQUE: Contiguous axial images were obtained from the base of the skull through the vertex without intravenous contrast. RADIATION DOSE REDUCTION: This exam was performed according to the departmental dose-optimization program which includes automated exposure control, adjustment of the mA and/or kV according to patient size and/or use of iterative reconstruction technique. COMPARISON:  None  Available. FINDINGS: Brain: Old right occipital and posterior frontal infarcts, unchanged. No acute intracranial abnormality. Specifically, no hemorrhage, hydrocephalus, mass lesion, acute infarction, or significant intracranial injury. Vascular: No hyperdense vessel or unexpected calcification. Skull: No acute calvarial abnormality. Sinuses/Orbits: No acute findings Other: None IMPRESSION: Old right side infarcts as above. No acute intracranial abnormality. Electronically Signed   By: Charlett Nose [name]   On: 12/12/2022 18:05    Procedures Procedures    Medications Ordered in ED Medications  sodium chloride flush (NS) 0.9 % injection 3 mL (0 mLs Intravenous Hold 12/12/22 2018)    ED Course/ Medical Decision Making/ A&P                                 Medical Decision Making Problems Addressed: Elevated blood pressure reading: acute illness or injury ESRD on dialysis [facility]): chronic illness or injury with exacerbation, progression, or side effects of treatment that poses a threat to life or bodily functions Essential hypertension: chronic illness or injury with exacerbation, progression, or side effects of treatment that poses a threat to life or bodily functions Left-sided weakness: acute illness or injury with systemic symptoms that poses a threat to life or bodily functions  Amount and/or Complexity of Data Reviewed External Data Reviewed: notes. Labs: ordered. Decision-making details documented in ED Course. Radiology: ordered and independent interpretation performed. Decision-making details documented in ED Course. ECG/medicine tests: ordered and independent interpretation performed. Decision-making details documented in ED Course.  Risk Prescription drug management. Decision regarding hospitalization.   Iv ns. Continuous pulse ox and cardiac monitoring. Labs ordered/sent. Imaging ordered.   Differential diagnosis includes cva, weakness, etc. Dispo decision including potential  need for admission considered - will get labs and imaging and reassess.   Reviewed nursing notes and prior charts for additional history. External reports reviewed.   Cardiac monitor: sinus rhythm, rate 66.  Labs reviewed/interpreted by me - ckd, k normal. Wbc normal, hct 35.   CT reviewed/interpreted by me - no hem.  MRI reviewed/interpreted by me -   Recheck, no change in exam. No new c/o. MRI remains pending.   2312, signed out to Dr Madilyn Hook to check MRI, recheck pt and dispo appropriately.          Final Clinical Impression(s) / ED Diagnoses Final diagnoses:  None    Rx / DC Orders ED Discharge Orders     None         Cathren Laine, MD 12/12/22 2314

## 2022-12-12 NOTE — ED Triage Notes (Signed)
Pt sent by pcp for further evaluation of stroke symptoms; c/o L arm and leg weakness that started last night; pt states she had a dialysis treatment last night and has had a recent sinus infection; states when she got home last night her L arm started jerking; pt has L arm drift and no effort again gravity with L leg; hx previous stroke, denies neuro deficits; hx DM, HTN, CAD, ESRD, CHF

## 2022-12-12 NOTE — ED Notes (Signed)
Transport here to get patient for MRI

## 2022-12-13 ENCOUNTER — Observation Stay (HOSPITAL_COMMUNITY): Payer: Medicare PPO

## 2022-12-13 ENCOUNTER — Encounter (HOSPITAL_COMMUNITY): Payer: Self-pay | Admitting: Family Medicine

## 2022-12-13 DIAGNOSIS — N186 End stage renal disease: Secondary | ICD-10-CM | POA: Diagnosis not present

## 2022-12-13 DIAGNOSIS — Z794 Long term (current) use of insulin: Secondary | ICD-10-CM

## 2022-12-13 DIAGNOSIS — I2510 Atherosclerotic heart disease of native coronary artery without angina pectoris: Secondary | ICD-10-CM | POA: Diagnosis not present

## 2022-12-13 DIAGNOSIS — G40109 Localization-related (focal) (partial) symptomatic epilepsy and epileptic syndromes with simple partial seizures, not intractable, without status epilepticus: Secondary | ICD-10-CM

## 2022-12-13 DIAGNOSIS — G40209 Localization-related (focal) (partial) symptomatic epilepsy and epileptic syndromes with complex partial seizures, not intractable, without status epilepticus: Secondary | ICD-10-CM | POA: Diagnosis not present

## 2022-12-13 DIAGNOSIS — Z8673 Personal history of transient ischemic attack (TIA), and cerebral infarction without residual deficits: Secondary | ICD-10-CM

## 2022-12-13 DIAGNOSIS — Z992 Dependence on renal dialysis: Secondary | ICD-10-CM

## 2022-12-13 DIAGNOSIS — E1122 Type 2 diabetes mellitus with diabetic chronic kidney disease: Secondary | ICD-10-CM | POA: Diagnosis not present

## 2022-12-13 DIAGNOSIS — R569 Unspecified convulsions: Secondary | ICD-10-CM

## 2022-12-13 DIAGNOSIS — I10 Essential (primary) hypertension: Secondary | ICD-10-CM

## 2022-12-13 LAB — BASIC METABOLIC PANEL
Anion gap: 11 (ref 5–15)
BUN: 35 mg/dL — ABNORMAL HIGH (ref 8–23)
CO2: 27 mmol/L (ref 22–32)
Calcium: 9.1 mg/dL (ref 8.9–10.3)
Chloride: 97 mmol/L — ABNORMAL LOW (ref 98–111)
Creatinine, Ser: 4.87 mg/dL — ABNORMAL HIGH (ref 0.44–1.00)
GFR, Estimated: 8 mL/min — ABNORMAL LOW (ref >60)
Glucose, Bld: 207 mg/dL — ABNORMAL HIGH (ref 70–99)
Potassium: 4.7 mmol/L (ref 3.5–5.1)
Sodium: 135 mmol/L (ref 135–145)

## 2022-12-13 LAB — CBC
HCT: 33.2 % — ABNORMAL LOW (ref 36.0–46.0)
Hemoglobin: 10.7 g/dL — ABNORMAL LOW (ref 12.0–15.0)
MCH: 30.2 pg (ref 26.0–34.0)
MCHC: 32.2 g/dL (ref 30.0–36.0)
MCV: 93.8 fL (ref 80.0–100.0)
Platelets: 290 10*3/uL (ref 150–400)
RBC: 3.54 MIL/uL — ABNORMAL LOW (ref 3.87–5.11)
RDW: 13.4 % (ref 11.5–15.5)
WBC: 8.4 10*3/uL (ref 4.0–10.5)
nRBC: 0 % (ref 0.0–0.2)

## 2022-12-13 LAB — GLUCOSE, CAPILLARY
Glucose-Capillary: 162 mg/dL — ABNORMAL HIGH (ref 70–99)
Glucose-Capillary: 383 mg/dL — ABNORMAL HIGH (ref 70–99)

## 2022-12-13 LAB — HEPATITIS B SURFACE ANTIGEN: Hepatitis B Surface Ag: NONREACTIVE

## 2022-12-13 LAB — MAGNESIUM: Magnesium: 2 mg/dL (ref 1.7–2.4)

## 2022-12-13 LAB — MRSA NEXT GEN BY PCR, NASAL: MRSA by PCR Next Gen: NOT DETECTED

## 2022-12-13 LAB — CK: Total CK: 55 U/L (ref 38–234)

## 2022-12-13 MED ORDER — BENZONATATE 100 MG PO CAPS: 100.0000 mg | ORAL_CAPSULE | Freq: Three times a day (TID) | ORAL | Status: DC | PRN

## 2022-12-13 MED ORDER — HYDRALAZINE HCL 50 MG PO TABS
100.0000 mg | ORAL_TABLET | Freq: Three times a day (TID) | ORAL | Status: DC
  Administered 2022-12-13 – 2022-12-14 (×2): 100 mg via ORAL
  Filled 2022-12-13 (×4): qty 2

## 2022-12-13 MED ORDER — HEPARIN SODIUM (PORCINE) 5000 UNIT/ML IJ SOLN
5000.0000 [IU] | Freq: Three times a day (TID) | INTRAMUSCULAR | Status: DC
  Administered 2022-12-13 – 2022-12-14 (×4): 5000 [IU] via SUBCUTANEOUS
  Filled 2022-12-13 (×4): qty 1

## 2022-12-13 MED ORDER — VENLAFAXINE HCL ER 75 MG PO CP24
75.0000 mg | ORAL_CAPSULE | Freq: Every day | ORAL | Status: DC
Start: 2022-12-13 — End: 2022-12-14
  Administered 2022-12-13 – 2022-12-14 (×2): 75 mg via ORAL
  Filled 2022-12-13 (×2): qty 1

## 2022-12-13 MED ORDER — INSULIN ASPART 100 UNIT/ML IJ SOLN
0.0000 [IU] | Freq: Three times a day (TID) | INTRAMUSCULAR | Status: DC
  Administered 2022-12-13: 5 [IU] via SUBCUTANEOUS
  Administered 2022-12-13: 1 [IU] via SUBCUTANEOUS
  Administered 2022-12-14: 2 [IU] via SUBCUTANEOUS
  Administered 2022-12-14: 1 [IU] via SUBCUTANEOUS

## 2022-12-13 MED ORDER — LOSARTAN POTASSIUM 25 MG PO TABS
25.0000 mg | ORAL_TABLET | Freq: Every day | ORAL | Status: DC
  Administered 2022-12-14: 25 mg via ORAL
  Filled 2022-12-13: qty 1

## 2022-12-13 MED ORDER — FOLIC ACID 1 MG PO TABS
1.0000 mg | ORAL_TABLET | Freq: Every day | ORAL | Status: DC
  Administered 2022-12-13 – 2022-12-14 (×2): 1 mg via ORAL
  Filled 2022-12-13 (×2): qty 1

## 2022-12-13 MED ORDER — ASPIRIN 81 MG PO TBEC
81.0000 mg | DELAYED_RELEASE_TABLET | Freq: Every day | ORAL | Status: DC
  Administered 2022-12-13 – 2022-12-14 (×2): 81 mg via ORAL
  Filled 2022-12-13 (×2): qty 1

## 2022-12-13 MED ORDER — CALCITRIOL 0.5 MCG PO CAPS: 0.7500 ug | ORAL_CAPSULE | ORAL | Status: DC

## 2022-12-13 MED ORDER — LEVETIRACETAM 250 MG PO TABS: 250.0000 mg | ORAL_TABLET | ORAL | Status: DC

## 2022-12-13 MED ORDER — INSULIN ASPART 100 UNIT/ML IJ SOLN: 0.0000 [IU] | Freq: Every day | INTRAMUSCULAR | Status: DC

## 2022-12-13 MED ORDER — ALBUTEROL SULFATE (2.5 MG/3ML) 0.083% IN NEBU: 2.5000 mg | INHALATION_SOLUTION | Freq: Four times a day (QID) | RESPIRATORY_TRACT | Status: DC | PRN

## 2022-12-13 MED ORDER — CHLORHEXIDINE GLUCONATE CLOTH 2 % EX PADS
6.0000 | MEDICATED_PAD | Freq: Every day | CUTANEOUS | Status: DC
  Administered 2022-12-14: 6 via TOPICAL

## 2022-12-13 MED ORDER — ONDANSETRON HCL 4 MG PO TABS: 4.0000 mg | ORAL_TABLET | Freq: Four times a day (QID) | ORAL | Status: DC | PRN

## 2022-12-13 MED ORDER — SODIUM CHLORIDE 0.9% FLUSH
3.0000 mL | Freq: Two times a day (BID) | INTRAVENOUS | Status: DC
  Administered 2022-12-13 – 2022-12-14 (×2): 3 mL via INTRAVENOUS

## 2022-12-13 MED ORDER — ACETAMINOPHEN 650 MG RE SUPP: 650.0000 mg | Freq: Four times a day (QID) | RECTAL | Status: DC | PRN

## 2022-12-13 MED ORDER — LABETALOL HCL 200 MG PO TABS: 200.0000 mg | ORAL_TABLET | Freq: Two times a day (BID) | ORAL | Status: DC

## 2022-12-13 MED ORDER — LEVETIRACETAM 500 MG PO TABS
500.0000 mg | ORAL_TABLET | Freq: Every day | ORAL | Status: DC
  Administered 2022-12-13 – 2022-12-14 (×2): 500 mg via ORAL
  Filled 2022-12-13 (×2): qty 1

## 2022-12-13 MED ORDER — PENTAFLUOROPROP-TETRAFLUOROETH EX AERO: 1.0000 | INHALATION_SPRAY | CUTANEOUS | Status: DC | PRN

## 2022-12-13 MED ORDER — ISOSORBIDE MONONITRATE ER 60 MG PO TB24
240.0000 mg | ORAL_TABLET | Freq: Every day | ORAL | Status: DC
  Administered 2022-12-13 – 2022-12-14 (×2): 240 mg via ORAL
  Filled 2022-12-13 (×2): qty 4

## 2022-12-13 MED ORDER — HEPARIN SODIUM (PORCINE) 1000 UNIT/ML DIALYSIS
2000.0000 [IU] | INTRAMUSCULAR | Status: DC | PRN
  Administered 2022-12-13: 2000 [IU] via INTRAVENOUS_CENTRAL
  Filled 2022-12-13: qty 2

## 2022-12-13 MED ORDER — INSULIN GLARGINE-YFGN 100 UNIT/ML ~~LOC~~ SOLN
5.0000 [IU] | Freq: Every day | SUBCUTANEOUS | Status: DC
Start: 2022-12-13 — End: 2022-12-14
  Administered 2022-12-14: 5 [IU] via SUBCUTANEOUS
  Filled 2022-12-13 (×2): qty 0.05

## 2022-12-13 MED ORDER — PANTOPRAZOLE SODIUM 40 MG PO TBEC
40.0000 mg | DELAYED_RELEASE_TABLET | Freq: Every day | ORAL | Status: DC
  Administered 2022-12-13: 40 mg via ORAL
  Filled 2022-12-13: qty 1

## 2022-12-13 MED ORDER — ACETAMINOPHEN 325 MG PO TABS: 650.0000 mg | ORAL_TABLET | Freq: Four times a day (QID) | ORAL | Status: DC | PRN

## 2022-12-13 MED ORDER — ONDANSETRON HCL 4 MG/2ML IJ SOLN: 4.0000 mg | Freq: Four times a day (QID) | INTRAMUSCULAR | Status: DC | PRN

## 2022-12-13 MED ORDER — BRIMONIDINE TARTRATE 0.2 % OP SOLN: 1.0000 [drp] | Freq: Every day | OPHTHALMIC | Status: DC

## 2022-12-13 NOTE — ED Provider Notes (Signed)
Care assumed at 2330.  Patient with history of prior CVA here for evaluation of left upper extremity and left lower extremity weakness.  Imaging and labs so far have been unremarkable.  Care assumed pending MRI brain.  MRI with chronic infarcts.  On examination patient does state that her weakness in her leg is improving.  She does complain of some jerking in her arm that has been present since Saturday.  Overall this is improving.  She does have an occasional twitch to the arm, but is able to lift against gravity.  Discussed with got with neurology.  Concern for potential focal seizure.  Will start on Keppra, plan to admit to medicine service for observation.   Tilden Fossa, MD 12/13/22 [phone]

## 2022-12-13 NOTE — Consult Note (Addendum)
ESRD Consult Note   Reason for consult: ESRD, provision of dialysis  Assessment/Recommendations:  ESRD  -outpatient HD orders: [place].  MWF. 3 hours 45 minutes.  EDW 58.8 kg.  Access: LUE AVF, flow rates: 400/autoflow 1.5, 15g. 2k, 2.5cal. Heparin 2000 units bolus. Meds: Venofer 50mg  once weekly, calcitriol 0.82mcg with HD -HD today. On holiday sched this week: Sun, Tues, Fri  Focal seizure -per primary and neuro  Volume/ hypertension  -UF as tolerated  Anemia of Chronic Kidney Disease Hemoglobin 10.7-acceptable for now -Transfuse PRN for Hgb <7  Secondary Hyperparathyroidism/Hyperphosphatemia -resuming calcitriol. Resume home binders if on any. Chk PO4   Dispo: obs, possible d/c tomorrow per primary service  Recommendations were discussed with the primary team. Discussed with daughter at the bedside.  Anthony Sar, MD [facility]  History of Present Illness: [name] is a/an 82 y.o. female with a past medical history of ESRD, DM2, HTN, h/o CVA, CAD, severe AS s/p TAVR, OSA who presents with left arm and leg weakness and left arm jerking. Started 11/23, improved gradually but returned. In ER, no acute abnormalities noted on [facility] or MRI brain. Underwent EEG. On keppra. Patient seen and examined in room. Daughter at bedside. She denies any complaints currently. Reports that HD has been going well and hasn't had any issues.   Medications:  Current Facility-Administered Medications  Medication Dose Route Frequency Provider Last Rate Last Admin   acetaminophen (TYLENOL) tablet 650 mg  650 mg Oral Q6H PRN Opyd, Lavone Neri, MD       Or   acetaminophen (TYLENOL) suppository 650 mg  650 mg Rectal Q6H PRN Opyd, Lavone Neri, MD       albuterol (PROVENTIL) (2.5 MG/3ML) 0.083% nebulizer solution 2.5 mg  2.5 mg Inhalation Q6H PRN Opyd, Lavone Neri, MD       aspirin EC tablet 81 mg  81 mg Oral Daily Opyd, Lavone Neri, MD   81 mg at 12/13/22 1055   benzonatate (TESSALON)  capsule 100 mg  100 mg Oral TID PRN Opyd, Lavone Neri, MD       folic acid (FOLVITE) tablet 1 mg  1 mg Oral Daily Opyd, Lavone Neri, MD   1 mg at 12/13/22 1055   heparin injection 5,000 Units  5,000 Units Subcutaneous Q8H Opyd, Lavone Neri, MD   5,000 Units at 12/13/22 0551   hydrALAZINE (APRESOLINE) tablet 100 mg  100 mg Oral TID Briscoe Deutscher, MD   100 mg at 12/13/22 1055   insulin aspart (novoLOG) injection 0-5 Units  0-5 Units Subcutaneous QHS Opyd, Lavone Neri, MD       insulin aspart (novoLOG) injection 0-6 Units  0-6 Units Subcutaneous TID WC Opyd, Lavone Neri, MD   3 Units at 12/13/22 1105   insulin glargine-yfgn (SEMGLEE) injection 5 Units  5 Units Subcutaneous QHS Opyd, Lavone Neri, MD       isosorbide mononitrate (IMDUR) 24 hr tablet 240 mg  240 mg Oral Daily Opyd, Lavone Neri, MD   240 mg at 12/13/22 1055   levETIRAcetam (KEPPRA) tablet 500 mg  500 mg Oral QHS Opyd, Lavone Neri, MD   500 mg at 12/13/22 1610   And   [START ON 12/14/2022] levETIRAcetam (KEPPRA) tablet 250 mg  250 mg Oral Q M,W,F Opyd, Lavone Neri, MD       losartan (COZAAR) tablet 25 mg  25 mg Oral QHS Opyd, Lavone Neri, MD       ondansetron (ZOFRAN) tablet 4 mg  4 mg Oral Q6H PRN Opyd, Lavone Neri, MD       Or   ondansetron (ZOFRAN) injection 4 mg  4 mg Intravenous Q6H PRN Opyd, Lavone Neri, MD       pantoprazole (PROTONIX) EC tablet 40 mg  40 mg Oral Q1200 Opyd, Lavone Neri, MD       sodium chloride flush (NS) 0.9 % injection 3 mL  3 mL Intravenous Once Opyd, Lavone Neri, MD       sodium chloride flush (NS) 0.9 % injection 3 mL  3 mL Intravenous Q12H Opyd, Lavone Neri, MD   3 mL at 12/13/22 1056   venlafaxine XR (EFFEXOR-XR) 24 hr capsule 75 mg  75 mg Oral Daily Opyd, Lavone Neri, MD   75 mg at 12/13/22 1055     ALLERGIES Patient has no known allergies.  MEDICAL HISTORY Past Medical History:  Diagnosis Date   Arthritis    Cerebrovascular disease    CHF (congestive heart failure) (HCC)    CKD (chronic kidney disease)    Sees Dr Signe Colt    Coronary artery disease    Depression    Diabetes (HCC)    INSULIN DEPENDENT   Diabetic peripheral neuropathy (HCC)    Diabetic retinopathy (HCC)    Diverticulitis    GERD (gastroesophageal reflux disease)    History of CVA (cerebrovascular accident)    x 2 no residulal   Hyperlipidemia    Hypertension    Obesity    Renal lesion    S/P TAVR (transcatheter aortic valve replacement) 11/13/2018   s/p TAVR with a 23mm Edwards S3U via the TF approach by Dr. Cornelius Moras and Dr. Clifton James   Severe aortic stenosis      SOCIAL HISTORY Social History   Socioeconomic History   Marital status: Widowed    Spouse name: Not on file   Number of children: 2   Years of education: PHD   Highest education level: Not on file  Occupational History   Occupation: Retired PhD Teacher English  Tobacco Use   Smoking status: Never   Smokeless tobacco: Never  Vaping Use   Vaping status: Never Used  Substance and Sexual Activity   Alcohol use: No   Drug use: No   Sexual activity: Not on file  Other Topics Concern   Not on file  Social History Narrative   Lives alone, has brother close by   Patient is right-handed.   Caffeine use: 2-3 cups every day   Social Determinants of Health   Financial Resource Strain: Not on file  Food Insecurity: No Food Insecurity (12/13/2022)   Hunger Vital Sign    Worried About Running Out of Food in the Last Year: Never true    Ran Out of Food in the Last Year: Never true  Transportation Needs: No Transportation Needs (12/13/2022)   PRAPARE - Administrator, Civil Service (Medical): No    Lack of Transportation (Non-Medical): No  Physical Activity: Not on file  Stress: Not on file  Social Connections: Not on file  Intimate Partner Violence: Not At Risk (12/13/2022)   Humiliation, Afraid, Rape, and Kick questionnaire    Fear of Current or Ex-Partner: No    Emotionally Abused: No    Physically Abused: No    Sexually Abused: No     FAMILY  HISTORY Family History  Problem Relation Age of Onset   Diabetes Mother    Heart disease Mother    Prostate cancer Father  Hypertension Brother    Prostate cancer Brother      Review of Systems: 12 systems were reviewed and negative except per HPI  Physical Exam: Vitals:   12/13/22 0751 12/13/22 1100  BP: (!) 161/63 (!) 153/58  Pulse: 60 65  Resp: 18 18  Temp: 97.6 F (36.4 C) 98.3 F (36.8 C)  SpO2: 100% 100%   No intake/output data recorded. No intake or output data in the 24 hours ending 12/13/22 1307 General: well-appearing, no acute distress HEENT: anicteric sclera, MMM CV: normal rate, + systolic murmur, no edema Lungs: bilateral chest rise, normal wob, cta b/l Abd: soft, non-tender, non-distended Skin: no visible lesions or rashes Psych: alert, engaged, appropriate mood and affect Neuro: normal speech, no gross focal deficits  Dialysis access: LUE AVF +b/t  Test Results Reviewed Lab Results  Component Value Date   NA 135 12/13/2022   K 4.7 12/13/2022   CL 97 (L) 12/13/2022   CO2 27 12/13/2022   BUN 35 (H) 12/13/2022   CREATININE 4.87 (H) 12/13/2022   CALCIUM 9.1 12/13/2022   ALBUMIN 3.2 (L) 12/12/2022   PHOS 3.6 03/02/2022    I have reviewed relevant outside healthcare records

## 2022-12-13 NOTE — Progress Notes (Signed)
EEG complete - results pending 

## 2022-12-13 NOTE — Plan of Care (Signed)
Pt alert and oriented x 4. Complains of chronic pain, see MAR. Anuric. Family to bedside this shift. Will continue with current plan of care.  Problem: Education: Goal: Ability to describe self-care measures that may prevent or decrease complications (Diabetes Survival Skills Education) will improve Outcome: Progressing Goal: Individualized Educational Video(s) Outcome: Progressing   Problem: Coping: Goal: Ability to adjust to condition or change in health will improve Outcome: Progressing   Problem: Fluid Volume: Goal: Ability to maintain a balanced intake and output will improve Outcome: Progressing   Problem: Health Behavior/Discharge Planning: Goal: Ability to identify and utilize available resources and services will improve Outcome: Progressing Goal: Ability to manage health-related needs will improve Outcome: Progressing   Problem: Metabolic: Goal: Ability to maintain appropriate glucose levels will improve Outcome: Progressing   Problem: Nutritional: Goal: Maintenance of adequate nutrition will improve Outcome: Progressing Goal: Progress toward achieving an optimal weight will improve Outcome: Progressing   Problem: Skin Integrity: Goal: Risk for impaired skin integrity will decrease Outcome: Progressing   Problem: Tissue Perfusion: Goal: Adequacy of tissue perfusion will improve Outcome: Progressing   Problem: Education: Goal: Knowledge of General Education information will improve Description: Including pain rating scale, medication(s)/side effects and non-pharmacologic comfort measures Outcome: Progressing   Problem: Health Behavior/Discharge Planning: Goal: Ability to manage health-related needs will improve Outcome: Progressing   Problem: Clinical Measurements: Goal: Ability to maintain clinical measurements within normal limits will improve Outcome: Progressing Goal: Will remain free from infection Outcome: Progressing Goal: Diagnostic test results  will improve Outcome: Progressing Goal: Respiratory complications will improve Outcome: Progressing Goal: Cardiovascular complication will be avoided Outcome: Progressing   Problem: Activity: Goal: Risk for activity intolerance will decrease Outcome: Progressing   Problem: Nutrition: Goal: Adequate nutrition will be maintained Outcome: Progressing   Problem: Coping: Goal: Level of anxiety will decrease Outcome: Progressing   Problem: Elimination: Goal: Will not experience complications related to bowel motility Outcome: Progressing Goal: Will not experience complications related to urinary retention Outcome: Progressing   Problem: Pain Management: Goal: General experience of comfort will improve Outcome: Progressing   Problem: Safety: Goal: Ability to remain free from injury will improve Outcome: Progressing   Problem: Skin Integrity: Goal: Risk for impaired skin integrity will decrease Outcome: Progressing

## 2022-12-13 NOTE — TOC Initial Note (Signed)
Transition of Care [facility]) - Initial/Assessment Note    Patient Details  Name: [name] MRN: [id number] Date of Birth: 10/02/1940  [facility] CM/SW Contact:    Kermit Balo, RN Phone Number: 12/13/2022, 10:23 AM  Clinical Narrative:                  Pt is from home with a live in caregiver. Her daughter also lives locally.  Patients daughter or brother provide needed transportation.  Pt states her caregiver manages her medications at home. TOC is following for d/c needs.   Expected Discharge Plan: Home/Self Care Barriers to Discharge: Continued Medical Work up   Patient Goals and CMS Choice            Expected Discharge Plan and Services   Discharge Planning Services: CM Consult   Living arrangements for the past 2 months: Single Family Home                                      Prior Living Arrangements/Services Living arrangements for the past 2 months: Single Family Home Lives with:: Self (caregiver) Patient language and need for interpreter reviewed:: Yes Do you feel safe going back to the place where you live?: Yes        Care giver support system in place?: Yes (comment) Current home services: DME (walker/ cane/ shower seat/ wheelchair) Criminal Activity/Legal Involvement Pertinent to Current Situation/Hospitalization: No - Comment as needed  Activities of Daily Living   ADL Screening (condition at time of admission) Independently performs ADLs?: Yes (appropriate for developmental age) Is the patient deaf or have difficulty hearing?: No Does the patient have difficulty seeing, even when wearing glasses/contacts?: Yes (Blind L eye) Does the patient have difficulty concentrating, remembering, or making decisions?: No  Permission Sought/Granted                  Emotional Assessment Appearance:: Appears stated age Attitude/Demeanor/Rapport: Engaged Affect (typically observed): Accepting Orientation: : Oriented to Self,  Oriented to Place, Oriented to  Time, Oriented to Situation   Psych Involvement: No (comment)  Admission diagnosis:  Focal seizure (HCC) [R56.9] Elevated blood pressure reading [R03.0] ESRD on dialysis (HCC) [N18.6, Z99.2] Left-sided weakness [R53.1] Essential hypertension [I10] Patient Active Problem List   Diagnosis Date Noted   Focal seizure (HCC) 12/13/2022   History of stroke 12/13/2022   Elevated troponin 02/28/2022   ESRD on dialysis (HCC) 02/28/2022   Malnutrition of moderate degree 01/20/2022   Acute respiratory failure with hypoxia (HCC) 01/18/2022   Acute on chronic diastolic CHF (congestive heart failure) (HCC) 01/17/2022   COVID-19 virus infection 09/07/2021   DM (diabetes mellitus), type 2 with renal complications (HCC) 09/07/2021   History of vitrectomy 05/04/2021   Pseudophakia, right eye 05/04/2021   Right posterior capsular opacification 05/04/2021   Intolerance of continuous positive airway pressure (CPAP) ventilation 03/25/2021   Non compliance with medical treatment 03/25/2021   Difficulty with adaptive servo-ventilation (ASV) use 03/25/2021   Treatment-emergent central sleep apnea 08/27/2020   Complex sleep apnea syndrome 08/27/2020   Need for prophylactic antibiotic 07/09/2020   CKD (chronic kidney disease), stage V (HCC) 07/09/2020   Community acquired pneumonia 06/25/2020   PVD (peripheral vascular disease) with claudication (HCC) 06/04/2020   Cardiovascular renal disease, stage 1-4 or unspecified chronic kidney disease, without heart failure 05/13/2020   SOB (shortness of breath) 05/12/2020  Cerebrovascular accident (CVA) due to embolism of right posterior cerebral artery (HCC) 05/11/2020   CHF (congestive heart failure) (HCC) 05/11/2020   Stable treated proliferative diabetic retinopathy of right eye without macular edema determined by examination associated with type 2 diabetes mellitus (HCC) 03/02/2020   Posterior vitreous detachment of right eye  03/02/2020   Anemia 02/28/2020   Acute on chronic renal failure (HCC) 02/28/2020   Hypokalemia    Diabetic peripheral neuropathy (HCC)    Benign essential HTN    Cerebral infarction due to embolism of right anterior cerebral artery (HCC) 02/19/2020   Ischemic stroke of frontal lobe (HCC) 02/17/2020   Cerebral embolism with cerebral infarction 02/12/2020   Thrombocytopenia (HCC) 02/10/2020   Acute left-sided weakness 02/09/2020   Weakness 02/08/2020   Chronic heart failure with preserved ejection fraction (HCC) 01/02/2020   Atypical chest pain 05/31/2019   Central retinal vein occlusion, left eye, stable 05/29/2019   Central retinal vein occlusion of left eye 05/28/2019   Proliferative diabetic retinopathy of left eye with macular edema associated with type 2 diabetes mellitus (HCC) 05/28/2019   Acute on chronic combined systolic and diastolic CHF (congestive heart failure) (HCC) 11/13/2018   S/P TAVR (transcatheter aortic valve replacement) 11/13/2018   Essential hypertension    Hyperlipidemia    Renal lesion    Acute kidney injury superimposed on chronic kidney disease (HCC) 12/05/2017   Coronary artery disease involving native coronary artery of native heart without angina pectoris 01/07/2014   S/P PTCA (percutaneous transluminal coronary angioplasty) 01/07/2014   History of ischemic anterior cerebral artery stroke 01/30/2013   PCP:  Harvest Forest, MD Pharmacy:   [facility] [phone] [place], [place] - [address] [place] [address] Phone: [phone] Fax: [phone]     Social Determinants of Health (SDOH) Social History: SDOH Screenings   Food Insecurity: No Food Insecurity (12/13/2022)  Housing: Low Risk  (12/13/2022)  Transportation Needs: No Transportation Needs (12/13/2022)  Utilities: Not At Risk (12/13/2022)  Depression (PHQ2-9): Medium Risk (03/20/2020)  Tobacco Use: Low Risk  (12/13/2022)   SDOH Interventions:      Readmission Risk Interventions    09/09/2021   10:21 AM  Readmission Risk Prevention Plan  Transportation Screening Complete  PCP or Specialist Appt within 5-7 Days Complete  Home Care Screening Complete  Medication Review (RN CM) Complete

## 2022-12-13 NOTE — Procedures (Signed)
Patient Name: [name]  MRN: [id number]  Epilepsy Attending: Charlsie Quest  Referring Physician/Provider: Gordy Councilman, MD  Date: 12/13/2022 Duration: 22.56 mins  Patient history: 82 yo M with jerking of her right arm and her left leg was much weaker than normal. The left leg also had some jerking as well but not necessarily at the same time as the arm. EEG to evaluate for seizure  Level of alertness: Awake, asleep  AEDs during EEG study: LEV  Technical aspects: This EEG study was done with scalp electrodes positioned according to the 10-20 International system of electrode placement. Electrical activity was reviewed with band pass filter of 1-70Hz , sensitivity of 7 uV/mm, display speed of 15mm/sec with a 60Hz  notched filter applied as appropriate. EEG data were recorded continuously and digitally stored.  Video monitoring was available and reviewed as appropriate.  Description: The posterior dominant rhythm consists of 8-9 Hz activity of moderate voltage (25-35 uV) seen predominantly in posterior head regions, symmetric and reactive to eye opening and eye closing. Sleep was characterized by vertex waves, sleep spindles (12 to 14 Hz), maximal frontocentral region. EEG showed continuous 3 to 6 Hz theta-delta slowing in right posterior temporal region. Hyperventilation and photic stimulation were not performed.     ABNORMALITY - Continuous slow, right posterior temporal region  IMPRESSION: This study is suggestive of cortical dysfunction arising from right posterior temporal region likely secondary to underlying stroke. No seizures or epileptiform discharges were seen throughout the recording.  Adren Dollins Annabelle Harman

## 2022-12-13 NOTE — Progress Notes (Signed)
PROGRESS NOTE  [name]  DOB: 21-Jun-1940  PCP: Harvest Forest, MD VHQ:[id number]  DOA: 12/12/2022  LOS: 0 days  Hospital Day: 2  Brief narrative: [name] is a 82 y.o. female with PMH significant for ESRD HD MWF, DM2, HTN, HLD, CAD, severe AS s/p TAVR, prior strokes with residual mild left hemiparesis, OSA with CPAP intolerance, anxiety/depression 11/25, patient presented to the ED with complaint of jerking and weakness of extremities. Patient recently had a sinus infection which was treated with antibiotics that she just finished on Sunday 11/24.  She reports she had impaired sleep during the course of infection.  On 11/23 Saturday, patient was having a great deal of jerking of her right arm and also felt weaker than normal left leg wound.  Symptoms improved next day. On 11/25 Monday, she went to dialysis as normal.  After dialysis, she felt recurrent weakness and hence presented to the ED.  In the ED patient is afebrile, hemodynamically stable She was noted to have some jerking on the left side which improved with IV Valium given prior to MRI. Labs with glucose 292 CT head and MRI brain unremarkable for acute findings.  But showed chronic right MCA, right PCA and right ACA distribution infarcts and age-related cerebral atrophy with chronic small vessel ischemic disease. Seen by neurology. Admitted to [facility]  Subjective: Patient was seen and examined this morning.  Propped up in bed.  Not in distress.  Slow to respond but able to answer questions.  Daughter at bedside.  Mentioned mental status gradually improving. Chart reviewed Afebrile, overnight blood pressure in 140s to 160s  Assessment and plan: Focal seizure with Todd's paresis Presented with intermittent jerking and weakness of extremities. Seen by neurology. Per neurology, patient might have had lowered seizure threshold in the setting of recent sinus infection and poor sleep. Started on Keppra 5 mg nightly with  additional 25 mg dose after dialysis sessions EEG obtained which showed cortical dysfunction arising from right posterior temporal region likely secondary to underlying stroke.  No seizure or epileptiform discharges were seen.  ESRD-HD-MWF For this week, per holiday schedule, she was supposed to be on dialysis Sunday, Tuesday and Friday.  Last dialysis was on Sunday.  Missed her outpatient dialysis this morning.  Nephrology involved  Does not seem volume overloaded Electrolytes in range Recent Labs  Lab 12/12/22 1623 12/12/22 1631 12/13/22 0836  K 3.8 3.6 4.7  MG  --   --  2.0   Recent Labs  Lab 12/13/22 0836  CKTOTAL 55    Type 2 diabetes mellitus A1c 7.2 in 2023. PTA meds-Tresiba 10 units nightly, Humalog Premeal Currently on 75 units daily SSI/Accu-Cheks Recent Labs  Lab 12/13/22 1055  GLUCAP 162*   Hypertension PTA meds-  Imdur 240 mg daily, losartan 25 mg daily, hydralazine 100 mg 3 times daily Continue all.  Continue to monitor blood pressure  HLD Repatha as an outpatient  H/o CVA  No acute CVA on MRI in ED  Continue ASA     CAD  No anginal symptoms  Continue ASA, nitrates   Anxiety/depression Effexor 75 mg daily  Goals of care   Code Status: Full Code    DVT prophylaxis:  heparin injection 5,000 Units Start: 12/13/22 0600   Antimicrobials: None Fluid: None Consultants: Nephrology Family Communication: Daughter at bedside  Status: Observation Level of care:  Telemetry Medical   Patient is from: Home Needs to continue in-hospital care: Gradually improving mental status pending dialysis  today/tonight Anticipated d/c to: Home hopefully tomorrow   Diet:  Diet Order             Diet renal with fluid restriction Fluid restriction: 1200 mL Fluid; Room service appropriate? Yes; Fluid consistency: Thin  Diet effective now                   Scheduled Meds:  aspirin EC  81 mg Oral Daily   folic acid  1 mg Oral Daily   heparin  5,000 Units  Subcutaneous Q8H   hydrALAZINE  100 mg Oral TID   insulin aspart  0-5 Units Subcutaneous QHS   insulin aspart  0-6 Units Subcutaneous TID WC   insulin glargine-yfgn  5 Units Subcutaneous QHS   isosorbide mononitrate  240 mg Oral Daily   levETIRAcetam  500 mg Oral QHS   And   [START ON 12/14/2022] levETIRAcetam  250 mg Oral Q M,W,F   losartan  25 mg Oral QHS   pantoprazole  40 mg Oral Q1200   sodium chloride flush  3 mL Intravenous Once   sodium chloride flush  3 mL Intravenous Q12H   venlafaxine XR  75 mg Oral Daily    PRN meds: acetaminophen **OR** acetaminophen, albuterol, benzonatate, ondansetron **OR** ondansetron (ZOFRAN) IV   Infusions:    Antimicrobials: Anti-infectives (From admission, onward)    None       Objective: Vitals:   12/13/22 0751 12/13/22 1100  BP: (!) 161/63 (!) 153/58  Pulse: 60 65  Resp: 18 18  Temp: 97.6 F (36.4 C) 98.3 F (36.8 C)  SpO2: 100% 100%   No intake or output data in the 24 hours ending 12/13/22 1308 Filed Weights   12/12/22 2032  Weight: 72.1 kg   Weight change:  Body mass index is 29.08 kg/m.   Physical Exam: General exam: Pleasant, elderly African-American female Skin: No rashes, lesions or ulcers. HEENT: Atraumatic, normocephalic, no obvious bleeding Lungs: Clear to auscultation bilaterally CVS: Regular rate and rhythm, no murmur GI/Abd soft, nontender, nondistended, bowel sound present CNS: Alert, awake, slow to respond, oriented to place and person Psychiatry: Mood appropriate Extremities: No pedal edema, no calf redness  Data Review: I have personally reviewed the laboratory data and studies available.  F/u labs ordered Unresulted Labs (From admission, onward)     Start     Ordered   12/14/22 0500  Hemoglobin A1c  Tomorrow morning,   R       Comments: To assess prior glycemic control    12/13/22 0504   12/13/22 0500  Basic metabolic panel  Daily,   R      12/13/22 0451   12/13/22 0500  CBC  Daily,   R       12/13/22 0451            Total time spent in review of labs and imaging, patient evaluation, formulation of plan, documentation and communication with family: 45 minutes  Signed, Lorin Glass, MD [facility] 12/13/2022

## 2022-12-13 NOTE — Care Management Obs Status (Signed)
MEDICARE OBSERVATION STATUS NOTIFICATION   Patient Details  Name: [name] MRN: [id number] Date of Birth: [birth year]   Medicare Observation Status Notification Given:  Yes    Kermit Balo, RN 12/13/2022, 10:22 AM

## 2022-12-13 NOTE — Plan of Care (Signed)
Patient seen and examined this morning by my colleague Dr. Iver Nestle in consultation. I was asked to follow-up on the EEG-EEG reveals continuous slowing in the right posterior temporal region likely from underlying old stroke. 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 Currently on Keppra 500 twice daily with additional to 50 mg dose after dialysis-I would recommend continue that. Maintain seizure precautions Okay to discharge from neurological standpoint when medically stable and follow-up with outpatient neurology-4 to 6 weeks at [facility]. Plan relayed to the primary team attending via secure chat  -- Milon Dikes, MD Neurologist [facility]

## 2022-12-13 NOTE — Progress Notes (Signed)
   12/13/22 2302  Vitals  Temp 97.7 F (36.5 C)  Temp Source Oral  BP 134/74  MAP (mmHg) 92  BP Location Right Arm  BP Method Automatic  Patient Position (if appropriate) Lying  Pulse Rate 69  Pulse Rate Source Monitor  ECG Heart Rate 72  Resp (!) 21  Oxygen Therapy  SpO2 100 %  O2 Device Room Air  During Treatment Monitoring  Blood Flow Rate (mL/min) 400 mL/min  Arterial Pressure (mmHg) -183.83 mmHg  Venous Pressure (mmHg) 255.34 mmHg  TMP (mmHg) -1.82 mmHg  Ultrafiltration Rate (mL/min) 744 mL/min  Dialysate Flow Rate (mL/min) 300 ml/min  Dialysate Potassium Concentration 2  Dialysate Calcium Concentration 2.5  Duration of HD Treatment -hour(s) 3.47 hour(s)  Cumulative Fluid Removed (mL) per Treatment  1977.64  HD Safety Checks Performed Yes  Intra-Hemodialysis Comments Tx completed  Post Treatment  Dialyzer Clearance Lightly streaked  Liters Processed 84  Fluid Removed (mL) 2000 mL  Tolerated HD Treatment Yes  Post-Hemodialysis Comments Pt treatment uneventful  AVG/AVF Arterial Site Held (minutes) 10 minutes  AVG/AVF Venous Site Held (minutes) 10 minutes   Hand off given Timoteo Expose RN

## 2022-12-13 NOTE — H&P (Signed)
History and Physical    [name] ZOX:[id number] DOB: 06/16/1940 DOA: 12/12/2022  PCP: Harvest Forest, MD   Patient coming from: Home   Chief Complaint: Left arm and leg weakness, left arm jerking   HPI: [name] is a 82 y.o. female with medical history significant for insulin-dependent diabetes mellitus, hypertension, CAD, history of CVA, severe AS status post TAVR, OSA with CPAP intolerance, and ESRD on hemodialysis who presents with left arm and leg weakness and left arm jerking.  Patient reports developing weakness involving the left arm and leg, and also jerking of the left arm on 12/10/2022.  The weakness gradually improved and she felt back to her baseline until she developed recurrent symptoms.  She denies any headache, fever, chills, or chest pain.    ED Course: Upon arrival to the ED, patient is found to be afebrile and saturating well on room air with normal heart rate and stable blood pressure.  Labs are most notable for glucose 292, BUN 27, and normal WBC.  No acute intracranial abnormalities noted on head CT or MRI brain.  Patient was evaluated by neurology in the emergency department and observation in the hospital with routine EEG and initiation of Keppra was recommended.  Review of Systems:  All other systems reviewed and apart from HPI, are negative.  Past Medical History:  Diagnosis Date   Arthritis    Cerebrovascular disease    CHF (congestive heart failure) (HCC)    CKD (chronic kidney disease)    Sees Dr Signe Colt   Coronary artery disease    Depression    Diabetes (HCC)    INSULIN DEPENDENT   Diabetic peripheral neuropathy (HCC)    Diabetic retinopathy (HCC)    Diverticulitis    GERD (gastroesophageal reflux disease)    History of CVA (cerebrovascular accident)    x 2 no residulal   Hyperlipidemia    Hypertension    Obesity    Renal lesion    S/P TAVR (transcatheter aortic valve replacement) 11/13/2018   s/p TAVR with a 23mm Edwards S3U via  the TF approach by Dr. Cornelius Moras and Dr. Clifton James   Severe aortic stenosis     Past Surgical History:  Procedure Laterality Date   ABDOMINAL HYSTERECTOMY     AV FISTULA PLACEMENT Left 12/18/2017   Procedure: ARTERIOVENOUS (AV) FISTULA CREATION ARM;  Surgeon: Cephus Shelling, MD;  Location: [facility] OR;  Service: Vascular;  Laterality: Left;   bilateral cataract surgery     CARDIAC CATHETERIZATION  01/07/2014   DR Jacinto Halim   COLON RESECTION  02/1999   COLONOSCOPY     COLOSTOMY  02/1999   COLOSTOMY CLOSURE  07/1999   EYE SURGERY Left 2019   FISTULA SUPERFICIALIZATION Left 06/29/2018   Procedure: FISTULA SUPERFICIALIZATION LEFT BRACHIOCEPHALIC;  Surgeon: Cephus Shelling, MD;  Location[facility] OR;  Service: Vascular;  Laterality: Left;   LEFT HEART CATHETERIZATION WITH CORONARY ANGIOGRAM N/A 01/07/2014   Procedure: LEFT HEART CATHETERIZATION WITH CORONARY ANGIOGRAM;  Surgeon: Pamella Pert, MD;  Location: [facility] CATH LAB;  Service: Cardiovascular;  Laterality: N/A;   LOOP RECORDER INSERTION N/A 02/17/2020   Procedure: LOOP RECORDER INSERTION;  Surgeon: Duke Salvia, MD;  Location: [facility] INVASIVE CV LAB;  Service: Cardiovascular;  Laterality: N/A;   middle cerebral artery stent placement Right    OTHER SURGICAL HISTORY     laser surgery   PTCA  01/07/2014   DES to RCA    DR Jacinto Halim   right knee  surgery Right    for infection   RIGHT/LEFT HEART CATH AND CORONARY ANGIOGRAPHY N/A 10/23/2018   Procedure: RIGHT/LEFT HEART CATH AND CORONARY ANGIOGRAPHY;  Surgeon: Elder Negus, MD;  Location: [facility] INVASIVE CV LAB;  Service: Cardiovascular;  Laterality: N/A;   TEE WITHOUT CARDIOVERSION N/A 11/13/2018   Procedure: TRANSESOPHAGEAL ECHOCARDIOGRAM (TEE);  Surgeon: Kathleene Hazel, MD;  Location: [facility] OR;  Service: Open Heart Surgery;  Laterality: N/A;   TRANSCATHETER AORTIC VALVE REPLACEMENT, TRANSFEMORAL  11/13/2018   TRANSCATHETER AORTIC VALVE REPLACEMENT, TRANSFEMORAL N/A 11/13/2018   Procedure:  TRANSCATHETER AORTIC VALVE REPLACEMENT, TRANSFEMORAL;  Surgeon: Kathleene Hazel, MD;  Location: [facility] OR;  Service: Open Heart Surgery;  Laterality: N/A;    Social History:   reports that she has never smoked. She has never used smokeless tobacco. She reports that she does not drink alcohol and does not use drugs.  No Known Allergies  Family History  Problem Relation Age of Onset   Diabetes Mother    Heart disease Mother    Prostate cancer Father    Hypertension Brother    Prostate cancer Brother      Prior to Admission medications   Medication Sig Start Date End Date Taking? Authorizing Provider  Acetaminophen 500 MG capsule Take 500 mg by mouth every 6 (six) hours as needed for moderate pain or headache.   Yes [provider]  albuterol (VENTOLIN HFA) 108 (90 Base) MCG/ACT inhaler Inhale 2 puffs into the lungs every 6 (six) hours as needed for wheezing or shortness of breath. 01/24/22  Yes Meredeth Ide, MD  aspirin EC 81 MG tablet Take 81 mg by mouth daily.   Yes [provider]  benzonatate (TESSALON) 100 MG capsule Take 100 mg by mouth 3 (three) times daily as needed for cough. 11/29/22  Yes [provider]  brimonidine (ALPHAGAN) 0.2 % ophthalmic solution Place 1 drop into the left eye daily. 04/19/19  Yes [provider]  folic acid (FOLVITE) 1 MG tablet Take 1 mg by mouth daily.   Yes [provider]  HUMALOG KWIKPEN 100 UNIT/ML KwikPen Inject 1-5 Units into the skin See admin instructions. Inject 1-5 units 3 times daily with meals and at bedtime. Dose depends on what pt is eating. 05/28/18  Yes [provider]  hydrALAZINE (APRESOLINE) 100 MG tablet Take 100 mg by mouth 3 (three) times daily. 08/09/21  Yes [provider]  Insulin Degludec (TRESIBA) 100 UNIT/ML SOLN Inject 10 Units into the skin at bedtime. 01/24/22  Yes Meredeth Ide, MD  isosorbide mononitrate (IMDUR) 120 MG 24 hr tablet TAKE 2 TABLETS BY MOUTH DAILY  03/04/22  Yes Patwardhan, Manish J, MD  losartan (COZAAR) 25 MG tablet Take 1 tablet (25 mg total) by mouth at bedtime. 01/24/22  Yes Meredeth Ide, MD  metoCLOPramide (REGLAN) 10 MG tablet Take 1 tablet (10 mg total) by mouth every 6 (six) hours. 06/02/22  Yes Rondel Baton, MD  pantoprazole (PROTONIX) 40 MG tablet Take 1 tablet (40 mg total) by mouth daily at 12 noon. 03/02/22  Yes Meredeth Ide, MD  venlafaxine XR (EFFEXOR-XR) 150 MG 24 hr capsule Take 1 capsule (150 mg total) by mouth daily. 02/27/20  Yes Love, Evlyn Kanner, PA-C  amoxicillin (AMOXIL) 500 MG tablet TAKE 4 TABS AS DIRECTED 30-60 MIN BEFORE DENTAL PROCEDURE Patient not taking: Reported on 04/07/2022 03/08/22   Elder Negus, MD  labetalol (NORMODYNE) 200 MG tablet TAKE 1 TABLET BY MOUTH TWICE  A DAY Patient not taking: Reported on 12/12/2022 11/02/21   Patwardhan, Anabel Bene, MD  REPATHA SURECLICK 140 MG/ML SOAJ INJECT 1 ML INTO THE SKIN EVERY 14 (FOURTEEN) DAYS. Patient not taking: Reported on 12/13/2022 02/05/21   Elder Negus, MD    Physical Exam: Vitals:   12/13/22 0045 12/13/22 0059 12/13/22 0200 12/13/22 0300  BP:   (!) 126/42 (!) 152/43  Pulse: 61 65 63 65  Resp: 17 20 (!) 24 11  Temp:    98.3 F (36.8 C)  TempSrc:    Oral  SpO2: 100% 100% 100% 100%  Weight:      Height:        Constitutional: NAD, calm  Eyes: PERTLA, lids and conjunctivae normal ENMT: Mucous membranes are moist. Posterior pharynx clear of any exudate or lesions.   Neck: supple, no masses  Respiratory: no wheezing, no crackles. No accessory muscle use.  Cardiovascular: S1 & S2 heard, regular rate and rhythm. No extremity edema.   Abdomen: No distension, no tenderness, soft. Bowel sounds active.  Musculoskeletal: no clubbing / cyanosis. No joint deformity upper and lower extremities.   Skin: no significant rashes, lesions, ulcers. Warm, dry, well-perfused. Neurologic: CN 2-12 grossly intact. Moving all extremities. Alert and oriented.   Psychiatric: Pleasant. Cooperative.    Labs and Imaging on Admission: I have personally reviewed following labs and imaging studies  CBC: Recent Labs  Lab 12/12/22 1623 12/12/22 1631  WBC 10.0  --   NEUTROABS 6.7  --   HGB 11.1* 12.2  HCT 34.8* 36.0  MCV 93.5  --   PLT 374  --    Basic Metabolic Panel: Recent Labs  Lab 12/12/22 1623 12/12/22 1631  NA 134* 134*  K 3.8 3.6  CL 96* 97*  CO2 25  --   GLUCOSE 292* 280*  BUN 27* 28*  CREATININE 4.33* 4.40*  CALCIUM 9.0  --    GFR: Estimated Creatinine Clearance: 9 mL/min (A) (by C-G formula based on SCr of 4.4 mg/dL (H)). Liver Function Tests: Recent Labs  Lab 12/12/22 1623  AST 26  ALT 15  ALKPHOS 69  BILITOT 0.5  PROT 6.3*  ALBUMIN 3.2*   No results for input(s): "LIPASE", "AMYLASE" in the last 168 hours. No results for input(s): "AMMONIA" in the last 168 hours. Coagulation Profile: Recent Labs  Lab 12/12/22 1623  INR 1.0   Cardiac Enzymes: No results for input(s): "CKTOTAL", "CKMB", "CKMBINDEX", "TROPONINI" in the last 168 hours. BNP (last 3 results) No results for input(s): "PROBNP" in the last 8760 hours. HbA1C: No results for input(s): "HGBA1C" in the last 72 hours. CBG: No results for input(s): "GLUCAP" in the last 168 hours. Lipid Profile: No results for input(s): "CHOL", "HDL", "LDLCALC", "TRIG", "CHOLHDL", "LDLDIRECT" in the last 72 hours. Thyroid Function Tests: No results for input(s): "TSH", "T4TOTAL", "FREET4", "T3FREE", "THYROIDAB" in the last 72 hours. Anemia Panel: No results for input(s): "VITAMINB12", "FOLATE", "FERRITIN", "TIBC", "IRON", "RETICCTPCT" in the last 72 hours. Urine analysis:    Component Value Date/Time   COLORURINE YELLOW 06/02/2022 1630   APPEARANCEUR CLEAR 06/02/2022 1630   LABSPEC 1.015 06/02/2022 1630   PHURINE 6.0 06/02/2022 1630   GLUCOSEU 150 (A) 06/02/2022 1630   HGBUR SMALL (A) 06/02/2022 1630   BILIRUBINUR NEGATIVE 06/02/2022 1630   KETONESUR NEGATIVE  06/02/2022 1630   PROTEINUR >=300 (A) 06/02/2022 1630   UROBILINOGEN 0.2 06/24/2020 1903   NITRITE NEGATIVE 06/02/2022 1630   LEUKOCYTESUR SMALL (A) 06/02/2022 1630   Sepsis  Labs: @LABRCNTIP (procalcitonin:4,lacticidven:4) )No results found for this or any previous visit (from the past 240 hour(s)).   Radiological Exams on Admission: MR BRAIN WO CONTRAST  Result Date: 12/13/2022 CLINICAL DATA:  Initial evaluation for neuro deficit, stroke suspected. EXAM: MRI HEAD WITHOUT CONTRAST TECHNIQUE: Multiplanar, multiecho pulse sequences of the brain and surrounding structures were obtained without intravenous contrast. COMPARISON:  CT from earlier the same day. FINDINGS: Brain: Generalized age-related cerebral atrophy. Patchy T2/FLAIR hyperintensity involving the periventricular and deep white matter both cerebral hemispheres, consistent with chronic small vessel ischemic disease. Encephalomalacia and gliosis involving the right frontal lobe, consistent with a chronic right MCA distribution infarct. Additional chronic right PCA distribution infarct involving the right occipital lobe. Chronic hemosiderin staining noted at this location. Additional suspected chronic right ACA distribution infarct with chronic hemosiderin staining noted as well. Few tiny remote bilateral cerebellar infarcts noted. No evidence for acute or subacute ischemia. No acute intracranial hemorrhage. No mass lesion, midline shift or mass effect. No hydrocephalus or extra-axial fluid collection. Pituitary gland and suprasellar region within normal limits. Vascular: Major intracranial vascular flow voids are maintained. Susceptibility artifact related to the vascular stent at the right M1 segment noted. Skull and upper cervical spine: Craniocervical junction within normal limits. Bone marrow signal intensity within normal limits. No scalp soft tissue abnormality. Sinuses/Orbits: Prior bilateral ocular lens replacement. Paranasal sinuses are  largely clear. No mastoid effusion. Other: None. IMPRESSION: 1. No acute intracranial abnormality. 2. Chronic right MCA, right PCA, and right ACA distribution infarcts. 3. Underlying age-related cerebral atrophy with chronic small vessel ischemic disease. Electronically Signed   By: Rise Mu [name]   On: 12/13/2022 00:37   CT HEAD WO CONTRAST  Result Date: 12/12/2022 CLINICAL DATA:  Neuro deficit, acute, stroke suspected EXAM: CT HEAD WITHOUT CONTRAST TECHNIQUE: Contiguous axial images were obtained from the base of the skull through the vertex without intravenous contrast. RADIATION DOSE REDUCTION: This exam was performed according to the departmental dose-optimization program which includes automated exposure control, adjustment of the mA and/or kV according to patient size and/or use of iterative reconstruction technique. COMPARISON:  None Available. FINDINGS: Brain: Old right occipital and posterior frontal infarcts, unchanged. No acute intracranial abnormality. Specifically, no hemorrhage, hydrocephalus, mass lesion, acute infarction, or significant intracranial injury. Vascular: No hyperdense vessel or unexpected calcification. Skull: No acute calvarial abnormality. Sinuses/Orbits: No acute findings Other: None IMPRESSION: Old right side infarcts as above. No acute intracranial abnormality. Electronically Signed   By: Charlett Nose [name]   On: 12/12/2022 18:05    EKG: Independently reviewed. Sinus rhythm.   Assessment/Plan   1. Suspected focal seizure  - Appreciate neurology assessment and recommendations  - Check magnesium and CK, start Keppra 500 mg nightly with additional 250 mg after dialysis, obtain routine EEG, consult PT/OT/SLP if fails to return to baseline, and contact neurology for increased jerking    2. ESRD  - Restrict fluids, renally-dose medications, consult nephrology for dialysis while in hospital    3. Hx of CVA  - No acute CVA on MRI in ED  - Continue ASA    4.  CAD  - No anginal symptoms  - Continue ASA, nitrates   5. Insulin-dependent DM  - A1c was 7.2% in August 2023  - Check CBGs and continue long- and short-acting insulin    6. Hypertension  - Continue hydralazine, losartan, and labetalol     DVT prophylaxis: sq heparin  Code Status: Full  Level of Care: Level of care: Telemetry  Medical Family Communication: None present   Disposition Plan:  Patient is from: Home  Anticipated d/c is to: Home  Anticipated d/c date is: 12/14/22  Patient currently: Pending EEG, return to baseline  Consults called: Neurology  Admission status: Observation     Briscoe Deutscher, MD [facility]  12/13/2022, 4:52 AM

## 2022-12-13 NOTE — Consult Note (Addendum)
Neurology Consultation Reason for Consult: c/f Focal seizure Requesting Physician: Tilden Fossa  CC: Left arm and leg jerking and weakness   History is obtained from: Patient, sister at bedside and chart review    HPI: [name] is a 82 y.o. 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  In the setting of the sinus infection she has not been sleeping as well.  Saturday she noted she was having a great deal of jerking of her right arm and her left leg was much weaker than normal.  The left leg also had some jerking as well but not necessarily at the same time as the arm.  Her weakness gradually improved and on Sunday she felt quite normal and went to dialysis as normal.  However on Monday she had recurrent weakness for which she was presented to the ED for arrival.  She was noted to have some jerking on that left side by her sister again and this did improve after she received Valium to facilitate MRI imaging.  Her weakness has been gradually improving as well  No falls, no neck injury, no new neck pain, no new bowel or bladder symptoms  ROS: All other review of systems was negative except as noted in the HPI.   Past Medical History:  Diagnosis Date   Arthritis    Cerebrovascular disease    CHF (congestive heart failure) (HCC)    CKD (chronic kidney disease)    Sees Dr Signe Colt   Coronary artery disease    Depression    Diabetes (HCC)     INSULIN DEPENDENT   Diabetic peripheral neuropathy (HCC)    Diabetic retinopathy (HCC)    Diverticulitis    GERD (gastroesophageal reflux disease)    History of CVA (cerebrovascular accident)    x 2 no residulal   Hyperlipidemia    Hypertension    Obesity    Renal lesion    S/P TAVR (transcatheter aortic valve replacement) 11/13/2018   s/p TAVR with a 23mm Edwards S3U via the TF approach by Dr. Cornelius Moras and Dr. Clifton James   Severe aortic stenosis    Past Surgical History:  Procedure Laterality Date   ABDOMINAL HYSTERECTOMY     AV FISTULA PLACEMENT Left 12/18/2017   Procedure: ARTERIOVENOUS (AV) FISTULA CREATION ARM;  Surgeon: Cephus Shelling, MD;  Location: [facility] OR;  Service: Vascular;  Laterality: Left;   bilateral cataract surgery     CARDIAC CATHETERIZATION  01/07/2014   DR Jacinto Halim   COLON RESECTION  02/1999   COLONOSCOPY     COLOSTOMY  02/1999   COLOSTOMY CLOSURE  07/1999   EYE SURGERY Left 2019   FISTULA SUPERFICIALIZATION Left 06/29/2018   Procedure: FISTULA SUPERFICIALIZATION LEFT BRACHIOCEPHALIC;  Surgeon: Cephus Shelling, MD;  Location: [facility] OR;  Service: Vascular;  Laterality: Left;   LEFT HEART CATHETERIZATION WITH CORONARY ANGIOGRAM N/A 01/07/2014   Procedure: LEFT HEART CATHETERIZATION WITH CORONARY ANGIOGRAM;  Surgeon: Pamella Pert, MD;  Location: [facility] CATH LAB;  Service: Cardiovascular;  Laterality: N/A;  LOOP RECORDER INSERTION N/A 02/17/2020   Procedure: LOOP RECORDER INSERTION;  Surgeon: Duke Salvia, MD;  Location: [facility] INVASIVE CV LAB;  Service: Cardiovascular;  Laterality: N/A;   middle cerebral artery stent placement Right    OTHER SURGICAL HISTORY     laser surgery   PTCA  01/07/2014   DES to RCA    DR Jacinto Halim   right knee surgery Right    for infection   RIGHT/LEFT HEART CATH AND CORONARY ANGIOGRAPHY N/A 10/23/2018   Procedure: RIGHT/LEFT HEART CATH AND CORONARY ANGIOGRAPHY;  Surgeon: Elder Negus, MD;  Location: [facility] INVASIVE CV LAB;  Service:  Cardiovascular;  Laterality: N/A;   TEE WITHOUT CARDIOVERSION N/A 11/13/2018   Procedure: TRANSESOPHAGEAL ECHOCARDIOGRAM (TEE);  Surgeon: Kathleene Hazel, MD;  Location: [facility] OR;  Service: Open Heart Surgery;  Laterality: N/A;   TRANSCATHETER AORTIC VALVE REPLACEMENT, TRANSFEMORAL  11/13/2018   TRANSCATHETER AORTIC VALVE REPLACEMENT, TRANSFEMORAL N/A 11/13/2018   Procedure: TRANSCATHETER AORTIC VALVE REPLACEMENT, TRANSFEMORAL;  Surgeon: Kathleene Hazel, MD;  Location: [facility] OR;  Service: Open Heart Surgery;  Laterality: N/A;    Current Facility-Administered Medications:    levETIRAcetam (KEPPRA) tablet 500 mg, 500 mg, Oral, QHS **AND** levETIRAcetam (KEPPRA) tablet 250 mg, 250 mg, Oral, Q dialysis, Aviance Cooperwood L, MD   sodium chloride flush (NS) 0.9 % injection 3 mL, 3 mL, Intravenous, Once, Cathren Laine, MD  Current Outpatient Medications:    Acetaminophen 500 MG capsule, Take 500 mg by mouth every 6 (six) hours as needed for moderate pain or headache., Disp: , Rfl:    albuterol (VENTOLIN HFA) 108 (90 Base) MCG/ACT inhaler, Inhale 2 puffs into the lungs every 6 (six) hours as needed for wheezing or shortness of breath., Disp: 8 g, Rfl: 0   aspirin EC 81 MG tablet, Take 81 mg by mouth daily., Disp: , Rfl:    benzonatate (TESSALON) 100 MG capsule, Take 100 mg by mouth 3 (three) times daily as needed for cough., Disp: , Rfl:    brimonidine (ALPHAGAN) 0.2 % ophthalmic solution, Place 1 drop into the left eye daily., Disp: , Rfl:    folic acid (FOLVITE) 1 MG tablet, Take 1 mg by mouth daily., Disp: , Rfl:    HUMALOG KWIKPEN 100 UNIT/ML KwikPen, Inject 1-5 Units into the skin See admin instructions. Inject 1-5 units 3 times daily with meals and at bedtime. Dose depends on what pt is eating., Disp: , Rfl:    hydrALAZINE (APRESOLINE) 100 MG tablet, Take 100 mg by mouth 3 (three) times daily., Disp: , Rfl:    Insulin Degludec (TRESIBA) 100 UNIT/ML SOLN, Inject 10 Units into the skin at  bedtime., Disp: 10 mL, Rfl: 1   isosorbide mononitrate (IMDUR) 120 MG 24 hr tablet, TAKE 2 TABLETS BY MOUTH DAILY, Disp: 180 tablet, Rfl: 1   losartan (COZAAR) 25 MG tablet, Take 1 tablet (25 mg total) by mouth at bedtime., Disp: 30 tablet, Rfl: 2   metoCLOPramide (REGLAN) 10 MG tablet, Take 1 tablet (10 mg total) by mouth every 6 (six) hours., Disp: 12 tablet, Rfl: 0   pantoprazole (PROTONIX) 40 MG tablet, Take 1 tablet (40 mg total) by mouth daily at 12 noon., Disp: 30 tablet, Rfl: 1   venlafaxine XR (EFFEXOR-XR) 150 MG 24 hr capsule, Take 1 capsule (150 mg total) by mouth daily., Disp: 30 capsule, Rfl: 0   amoxicillin (AMOXIL) 500 MG tablet, TAKE 4 TABS AS DIRECTED 30-60 MIN BEFORE DENTAL PROCEDURE (Patient not taking: Reported on 04/07/2022),  Disp: 4 tablet, Rfl: 5   labetalol (NORMODYNE) 200 MG tablet, TAKE 1 TABLET BY MOUTH TWICE A DAY (Patient not taking: Reported on 12/12/2022), Disp: 180 tablet, Rfl: 3   REPATHA SURECLICK 140 MG/ML SOAJ, INJECT 1 ML INTO THE SKIN EVERY 14 (FOURTEEN) DAYS. (Patient not taking: Reported on 12/13/2022), Disp: 6 mL, Rfl: 3   Family History  Problem Relation Age of Onset   Diabetes Mother    Heart disease Mother    Prostate cancer Father    Hypertension Brother    Prostate cancer Brother     Social History:  reports that she has never smoked. She has never used smokeless tobacco. She reports that she does not drink alcohol and does not use drugs.   Exam: Current vital signs: BP (!) 152/43   Pulse 65   Temp 97.8 F (36.6 C) (Oral)   Resp 11   Ht 5\' 2"  (1.575 m)   Wt 72.1 kg   SpO2 100%   BMI 29.08 kg/m  Vital signs in last 24 hours: Temp:  [97.8 F (36.6 C)-98.5 F (36.9 C)] 97.8 F (36.6 C) (11/26 0000) Pulse Rate:  [60-82] 65 (11/26 0300) Resp:  [11-24] 11 (11/26 0300) BP: (126-165)/(42-70) 152/43 (11/26 0300) SpO2:  [98 %-100 %] 100 % (11/26 0300) Weight:  [72.1 kg] 72.1 kg (11/25 2032)   Physical Exam  Constitutional: Appears  well-developed and well-nourished.  Psych: Affect slightly flat but cooperative and pleasant Eyes: No scleral injection HENT: No oropharyngeal obstruction.  MSK: no major joint deformities.  Cardiovascular: Perfusing extremities well Respiratory: Effort normal, non-labored breathing GI: Soft.  No distension. There is no tenderness.  Skin: Warm dry and intact visible skin  Neuro: Mental Status: Patient is awake, alert, oriented to person, place, month, year, and situation. Patient is able to give a clear and coherent history (though slightly different at times from history provided by sister in terms of waxing/waning of her weakness which is more clearly described by sister at bedside) No signs of aphasia or neglect Cranial Nerves: II: Visual Fields are full.  Ovoid left pupil, nonreactive/postsurgical.  Right pupil 4 mm to 2 mm III,IV, VI: EOMI without ptosis or diploplia.  V: Facial sensation is symmetric to temperature VII: Facial movement is symmetric.  VIII: hearing is intact to voice X: Uvula elevates symmetrically XI: Shoulder shrug is symmetric. XII: tongue is midline without atrophy or fasciculations.  Motor: Tone is normal. Bulk is normal. 5/5 strength was present throughout the right side.  Left side 4+/5 in the left upper extremity, 4 -/5 hip flexion, otherwise 5/5 knee flexion and extension.  1-2 brief flexion contraction jerks witnessed of the left upper extremity as well as left lower extremity, asynchronous Sensory: Sensation reduced on the left arm and leg, baseline per patient Deep Tendon Reflexes: 3+ in the left brachioradialis and patella, 2+ on the right Cerebellar: FNF and HKS are intact bilaterally within limits of weakness Gait:  Deferred in acute setting, ambulates with a walker at baseline   I have reviewed labs in epic and the results pertinent to this consultation are:  Basic Metabolic Panel: Recent Labs  Lab 12/12/22 1623 12/12/22 1631  NA 134*  134*  K 3.8 3.6  CL 96* 97*  CO2 25  --   GLUCOSE 292* 280*  BUN 27* 28*  CREATININE 4.33* 4.40*  CALCIUM 9.0  --     CBC: Recent Labs  Lab 12/12/22 1623 12/12/22 1631  WBC 10.0  --  NEUTROABS 6.7  --   HGB 11.1* 12.2  HCT 34.8* 36.0  MCV 93.5  --   PLT 374  --     Coagulation Studies: Recent Labs    12/12/22 1623  LABPROT 13.5  INR 1.0      I have reviewed the images obtained:  MRI brain 1. No acute intracranial abnormality. 2. Chronic right MCA, right PCA, and right ACA distribution infarcts. 3. Underlying age-related cerebral atrophy with chronic small vessel ischemic disease.  Impression: Overall given history provided I suspect focal seizure with post event Todd's paralysis.  Myoclonic jerking would also be on the differential, but would not be expected to cause weakness that gradually improved.  Suspect lowered seizure threshold in the setting of recent sinus infection and poor sleep secondary to the same  Recommendations: -Keppra 500 mg nightly with additional 250 mg dose after dialysis sessions -Routine EEG -Magnesium to assess for other correctable causes of lowered seizure threshold -CK to assess for potential rhabdomyolysis in the setting of seizure -Admit for obs to confirm return to baseline, consider PT/OT/SLP if not at baseline after initiation of antiseizure medications -Discussed with Dr. Madilyn Hook in person -If she has increasing frequency of left-sided jerking, please do reach out to neurology for adjustment of antiseizure medications -Appreciate management of comorbidities per ED/primary team -Neurology will follow-up EEG, if patient is tolerating Keppra well and returns to baseline she may be discharged with outpatient neurology follow-up and neurology will sign off  Brooke Dare MD-PhD [facility] [phone] Available 7 PM to 7 AM, outside of these hours please call Neurologist on call as listed on Amion.

## 2022-12-13 NOTE — ED Notes (Signed)
Patient given sandwich and applesauce.

## 2022-12-13 NOTE — ED Notes (Signed)
ED TO INPATIENT HANDOFF REPORT  ED Nurse Name and Phone #: Trinna Post, RN [phone]  S Name/Age/Gender [name] 82 y.o. female Room/Bed: 020C/020C  Code Status   Code Status: Prior  Home/SNF/Other Home Patient oriented to: self, place, time, and situation Is this baseline? Yes   Triage Complete: Triage complete  Chief Complaint Focal seizure (HCC) [R56.9]  Triage Note Pt sent by pcp for further evaluation of stroke symptoms; c/o L arm and leg weakness that started last night; pt states she had a dialysis treatment last night and has had a recent sinus infection; states when she got home last night her L arm started jerking; pt has L arm drift and no effort again gravity with L leg; hx previous stroke, denies neuro deficits; hx DM, HTN, CAD, ESRD, CHF   Allergies No Known Allergies  Level of Care/Admitting Diagnosis ED Disposition     ED Disposition  Admit   Condition  --   Comment  Hospital Area: [facility] [[id number]]  Level of Care: Telemetry Medical [[id number]]  May place patient in observation at [facility] or [facility] if equivalent level of care is available:: No  Covid Evaluation: Asymptomatic - no recent exposure (last 10 days) testing not required  Diagnosis: Focal seizure [facility]) [[id number]]  Admitting Physician: Briscoe Deutscher [[id number]]  Attending Physician: Briscoe Deutscher [[id number]]          B Medical/Surgery History Past Medical History:  Diagnosis Date   Arthritis    Cerebrovascular disease    CHF (congestive heart failure) (HCC)    CKD (chronic kidney disease)    Sees Dr Signe Colt   Coronary artery disease    Depression    Diabetes (HCC)    INSULIN DEPENDENT   Diabetic peripheral neuropathy (HCC)    Diabetic retinopathy (HCC)    Diverticulitis    GERD (gastroesophageal reflux disease)    History of CVA (cerebrovascular accident)    x 2 no residulal   Hyperlipidemia    Hypertension    Obesity    Renal lesion    S/P TAVR  (transcatheter aortic valve replacement) 11/13/2018   s/p TAVR with a 23mm Edwards S3U via the TF approach by Dr. Cornelius Moras and Dr. Clifton James   Severe aortic stenosis    Past Surgical History:  Procedure Laterality Date   ABDOMINAL HYSTERECTOMY     AV FISTULA PLACEMENT Left 12/18/2017   Procedure: ARTERIOVENOUS (AV) FISTULA CREATION ARM;  Surgeon: Cephus Shelling, MD;  Location: [facility] OR;  Service: Vascular;  Laterality: Left;   bilateral cataract surgery     CARDIAC CATHETERIZATION  01/07/2014   DR Jacinto Halim   COLON RESECTION  02/1999   COLONOSCOPY     COLOSTOMY  02/1999   COLOSTOMY CLOSURE  07/1999   EYE SURGERY Left 2019   FISTULA SUPERFICIALIZATION Left 06/29/2018   Procedure: FISTULA SUPERFICIALIZATION LEFT BRACHIOCEPHALIC;  Surgeon: Cephus Shelling, MD;  Location: [facility] OR;  Service: Vascular;  Laterality: Left;   LEFT HEART CATHETERIZATION WITH CORONARY ANGIOGRAM N/A 01/07/2014   Procedure: LEFT HEART CATHETERIZATION WITH CORONARY ANGIOGRAM;  Surgeon: Pamella Pert, MD;  Location: [facility] CATH LAB;  Service: Cardiovascular;  Laterality: N/A;   LOOP RECORDER INSERTION N/A 02/17/2020   Procedure: LOOP RECORDER INSERTION;  Surgeon: Duke Salvia, MD;  Location: [facility] INVASIVE CV LAB;  Service: Cardiovascular;  Laterality: N/A;   middle cerebral artery stent placement Right    OTHER SURGICAL HISTORY     laser surgery  PTCA  01/07/2014   DES to RCA    DR Jacinto Halim   right knee surgery Right    for infection   RIGHT/LEFT HEART CATH AND CORONARY ANGIOGRAPHY N/A 10/23/2018   Procedure: RIGHT/LEFT HEART CATH AND CORONARY ANGIOGRAPHY;  Surgeon: Elder Negus, MD;  Location: [facility] INVASIVE CV LAB;  Service: Cardiovascular;  Laterality: N/A;   TEE WITHOUT CARDIOVERSION N/A 11/13/2018   Procedure: TRANSESOPHAGEAL ECHOCARDIOGRAM (TEE);  Surgeon: Kathleene Hazel, MD;  Location: [facility] OR;  Service: Open Heart Surgery;  Laterality: N/A;   TRANSCATHETER AORTIC VALVE REPLACEMENT, TRANSFEMORAL   11/13/2018   TRANSCATHETER AORTIC VALVE REPLACEMENT, TRANSFEMORAL N/A 11/13/2018   Procedure: TRANSCATHETER AORTIC VALVE REPLACEMENT, TRANSFEMORAL;  Surgeon: Kathleene Hazel, MD;  Location: [facility] OR;  Service: Open Heart Surgery;  Laterality: N/A;     A IV Location/Drains/Wounds Patient Lines/Drains/Airways Status     Active Line/Drains/Airways     Name Placement date Placement time Site Days   Peripheral IV 12/12/22 20 G Anterior;Proximal;Right Forearm 12/12/22  2032  Forearm  1            Intake/Output Last 24 hours No intake or output data in the 24 hours ending 12/13/22 0342  Labs/Imaging Results for orders placed or performed during the hospital encounter of 12/12/22 (from the past 48 hour(s))  Protime-INR     Status: None   Collection Time: 12/12/22  4:23 PM  Result Value Ref Range   Prothrombin Time 13.5 11.4 - 15.2 seconds   INR 1.0 0.8 - 1.2    Comment: (NOTE) INR goal varies based on device and disease states. Performed at [facility] Lab, [address] [place], [place] [address]   APTT     Status: None   Collection Time: 12/12/22  4:23 PM  Result Value Ref Range   aPTT 29 24 - 36 seconds    Comment: Performed at [facility], [address], [place], [place] [address]  CBC     Status: Abnormal   Collection Time: 12/12/22  4:23 PM  Result Value Ref Range   WBC 10.0 4.0 - 10.5 K/uL   RBC 3.72 (L) 3.87 - 5.11 MIL/uL   Hemoglobin 11.1 (L) 12.0 - 15.0 g/dL   HCT 82.9 (L) 56.2 - 13.0 %   MCV 93.5 80.0 - 100.0 fL   MCH 29.8 26.0 - 34.0 pg   MCHC 31.9 30.0 - 36.0 g/dL   RDW 86.5 78.4 - 69.6 %   Platelets 374 150 - 400 K/uL   nRBC 0.0 0.0 - 0.2 %    Comment: Performed at [facility], [address], [place], [place] [address]  Differential     Status: None   Collection Time: 12/12/22  4:23 PM  Result Value Ref Range   Neutrophils Relative % 66 %   Neutro Abs 6.7 1.7 - 7.7 K/uL   Lymphocytes Relative 25 %   Lymphs Abs 2.5 0.7 - 4.0  K/uL   Monocytes Relative 6 %   Monocytes Absolute 0.6 0.1 - 1.0 K/uL   Eosinophils Relative 2 %   Eosinophils Absolute 0.2 0.0 - 0.5 K/uL   Basophils Relative 1 %   Basophils Absolute 0.1 0.0 - 0.1 K/uL   Immature Granulocytes 0 %   Abs Immature Granulocytes 0.03 0.00 - 0.07 K/uL    Comment: Performed at [facility] Lab, [address], [place], [place] [address]  Comprehensive metabolic panel     Status: Abnormal  Collection Time: 12/12/22  4:23 PM  Result Value Ref Range   Sodium 134 (L) 135 - 145 mmol/L   Potassium 3.8 3.5 - 5.1 mmol/L   Chloride 96 (L) 98 - 111 mmol/L   CO2 25 22 - 32 mmol/L   Glucose, Bld 292 (H) 70 - 99 mg/dL    Comment: Glucose reference range applies only to samples taken after fasting for at least 8 hours.   BUN 27 (H) 8 - 23 mg/dL   Creatinine, Ser 7.82 (H) 0.44 - 1.00 mg/dL   Calcium 9.0 8.9 - 95.6 mg/dL   Total Protein 6.3 (L) 6.5 - 8.1 g/dL   Albumin 3.2 (L) 3.5 - 5.0 g/dL   AST 26 15 - 41 U/L   ALT 15 0 - 44 U/L   Alkaline Phosphatase 69 38 - 126 U/L   Total Bilirubin 0.5 <1.2 mg/dL   GFR, Estimated 10 (L) >60 mL/min    Comment: (NOTE) Calculated using the CKD-EPI Creatinine Equation (2021)    Anion gap 13 5 - 15    Comment: Performed at [facility] Lab, [address], [place], [place] [address]  Ethanol     Status: None   Collection Time: 12/12/22  4:23 PM  Result Value Ref Range   Alcohol, Ethyl (B) <10 <10 mg/dL    Comment: (NOTE) Lowest detectable limit for serum alcohol is 10 mg/dL.  For medical purposes only. Performed at [facility], [address], [place], [place] [address]   I-stat chem 8, ED     Status: Abnormal   Collection Time: 12/12/22  4:31 PM  Result Value Ref Range   Sodium 134 (L) 135 - 145 mmol/L   Potassium 3.6 3.5 - 5.1 mmol/L   Chloride 97 (L) 98 - 111 mmol/L   BUN 28 (H) 8 - 23 mg/dL   Creatinine, Ser 6.96 (H) 0.44 - 1.00 mg/dL   Glucose, Bld 295 (H) 70 - 99 mg/dL    Comment: Glucose reference  range applies only to samples taken after fasting for at least 8 hours.   Calcium, Ion 1.08 (L) 1.15 - 1.40 mmol/L   TCO2 25 22 - 32 mmol/L   Hemoglobin 12.2 12.0 - 15.0 g/dL   HCT 28.4 13.2 - 44.0 %   MR BRAIN WO CONTRAST  Result Date: 12/13/2022 CLINICAL DATA:  Initial evaluation for neuro deficit, stroke suspected. EXAM: MRI HEAD WITHOUT CONTRAST TECHNIQUE: Multiplanar, multiecho pulse sequences of the brain and surrounding structures were obtained without intravenous contrast. COMPARISON:  CT from earlier the same day. FINDINGS: Brain: Generalized age-related cerebral atrophy. Patchy T2/FLAIR hyperintensity involving the periventricular and deep white matter both cerebral hemispheres, consistent with chronic small vessel ischemic disease. Encephalomalacia and gliosis involving the right frontal lobe, consistent with a chronic right MCA distribution infarct. Additional chronic right PCA distribution infarct involving the right occipital lobe. Chronic hemosiderin staining noted at this location. Additional suspected chronic right ACA distribution infarct with chronic hemosiderin staining noted as well. Few tiny remote bilateral cerebellar infarcts noted. No evidence for acute or subacute ischemia. No acute intracranial hemorrhage. No mass lesion, midline shift or mass effect. No hydrocephalus or extra-axial fluid collection. Pituitary gland and suprasellar region within normal limits. Vascular: Major intracranial vascular flow voids are maintained. Susceptibility artifact related to the vascular stent at the right M1 segment noted. Skull and upper cervical spine: Craniocervical junction within normal limits. Bone marrow signal intensity within normal limits. No scalp soft tissue abnormality. Sinuses/Orbits:  Prior bilateral ocular lens replacement. Paranasal sinuses are largely clear. No mastoid effusion. Other: None. IMPRESSION: 1. No acute intracranial abnormality. 2. Chronic right MCA, right PCA, and  right ACA distribution infarcts. 3. Underlying age-related cerebral atrophy with chronic small vessel ischemic disease. Electronically Signed   By: Rise Mu [name]   On: 12/13/2022 00:37   CT HEAD WO CONTRAST  Result Date: 12/12/2022 CLINICAL DATA:  Neuro deficit, acute, stroke suspected EXAM: CT HEAD WITHOUT CONTRAST TECHNIQUE: Contiguous axial images were obtained from the base of the skull through the vertex without intravenous contrast. RADIATION DOSE REDUCTION: This exam was performed according to the departmental dose-optimization program which includes automated exposure control, adjustment of the mA and/or kV according to patient size and/or use of iterative reconstruction technique. COMPARISON:  None Available. FINDINGS: Brain: Old right occipital and posterior frontal infarcts, unchanged. No acute intracranial abnormality. Specifically, no hemorrhage, hydrocephalus, mass lesion, acute infarction, or significant intracranial injury. Vascular: No hyperdense vessel or unexpected calcification. Skull: No acute calvarial abnormality. Sinuses/Orbits: No acute findings Other: None IMPRESSION: Old right side infarcts as above. No acute intracranial abnormality. Electronically Signed   By: Charlett Nose [name]   On: 12/12/2022 18:05    Pending Labs Unresulted Labs (From admission, onward)     Start     Ordered   12/13/22 0336  Magnesium  Add-on,   AD        12/13/22 0335   12/13/22 0336  CK  Add-on,   AD        12/13/22 0335            Vitals/Pain Today's Vitals   12/13/22 0045 12/13/22 0059 12/13/22 0200 12/13/22 0300  BP:   (!) 126/42 (!) 152/43  Pulse: 61 65 63 65  Resp: 17 20 (!) 24 11  Temp:    98.3 F (36.8 C)  TempSrc:    Oral  SpO2: 100% 100% 100% 100%  Weight:      Height:      PainSc:        Isolation Precautions No active isolations  Medications Medications  sodium chloride flush (NS) 0.9 % injection 3 mL (0 mLs Intravenous Hold 12/12/22 2018)   levETIRAcetam (KEPPRA) tablet 500 mg (500 mg Oral Given 12/13/22 0339)    And  levETIRAcetam (KEPPRA) tablet 250 mg (has no administration in time range)  diazepam (VALIUM) injection 2.5 mg (2.5 mg Intravenous Given 12/12/22 2057)    Mobility walks with person assist     Focused Assessments    R Recommendations: See Admitting Provider Note  Report given to:   Additional Notes:

## 2022-12-14 DIAGNOSIS — R4182 Altered mental status, unspecified: Secondary | ICD-10-CM | POA: Diagnosis not present

## 2022-12-14 DIAGNOSIS — G40209 Localization-related (focal) (partial) symptomatic epilepsy and epileptic syndromes with complex partial seizures, not intractable, without status epilepticus: Secondary | ICD-10-CM | POA: Diagnosis not present

## 2022-12-14 LAB — CBC
HCT: 34.6 % — ABNORMAL LOW (ref 36.0–46.0)
Hemoglobin: 11.2 g/dL — ABNORMAL LOW (ref 12.0–15.0)
MCH: 30.7 pg (ref 26.0–34.0)
MCHC: 32.4 g/dL (ref 30.0–36.0)
MCV: 94.8 fL (ref 80.0–100.0)
Platelets: 312 10*3/uL (ref 150–400)
RBC: 3.65 MIL/uL — ABNORMAL LOW (ref 3.87–5.11)
RDW: 13.3 % (ref 11.5–15.5)
WBC: 7.4 10*3/uL (ref 4.0–10.5)
nRBC: 0 % (ref 0.0–0.2)

## 2022-12-14 LAB — BASIC METABOLIC PANEL WITH GFR
Anion gap: 13 (ref 5–15)
BUN: 14 mg/dL (ref 8–23)
CO2: 26 mmol/L (ref 22–32)
Calcium: 8.5 mg/dL — ABNORMAL LOW (ref 8.9–10.3)
Chloride: 96 mmol/L — ABNORMAL LOW (ref 98–111)
Creatinine, Ser: 2.99 mg/dL — ABNORMAL HIGH (ref 0.44–1.00)
GFR, Estimated: 15 mL/min — ABNORMAL LOW
Glucose, Bld: 160 mg/dL — ABNORMAL HIGH (ref 70–99)
Potassium: 3.4 mmol/L — ABNORMAL LOW (ref 3.5–5.1)
Sodium: 135 mmol/L (ref 135–145)

## 2022-12-14 LAB — GLUCOSE, CAPILLARY
Glucose-Capillary: 139 mg/dL — ABNORMAL HIGH (ref 70–99)
Glucose-Capillary: 187 mg/dL — ABNORMAL HIGH (ref 70–99)
Glucose-Capillary: 202 mg/dL — ABNORMAL HIGH (ref 70–99)

## 2022-12-14 LAB — HEPATITIS B SURFACE ANTIBODY, QUANTITATIVE: Hep B S AB Quant (Post): <3.5 m[IU]/mL — ABNORMAL LOW

## 2022-12-14 LAB — HEMOGLOBIN A1C
Hgb A1c MFr Bld: 9.6 % — ABNORMAL HIGH (ref 4.8–5.6)
Mean Plasma Glucose: 228.82 mg/dL

## 2022-12-14 MED ORDER — LEVETIRACETAM 250 MG PO TABS: ORAL_TABLET | ORAL | 0 refills | Status: DC

## 2022-12-14 MED ORDER — LIVING WELL WITH DIABETES BOOK
Freq: Once | Status: AC
  Filled 2022-12-14: qty 1

## 2022-12-14 MED ORDER — HYDRALAZINE HCL 50 MG PO TABS: 50.0000 mg | ORAL_TABLET | Freq: Three times a day (TID) | ORAL | Status: DC

## 2022-12-14 MED ORDER — HYDRALAZINE HCL 50 MG PO TABS: 50.0000 mg | ORAL_TABLET | Freq: Three times a day (TID) | ORAL | 0 refills | Status: AC

## 2022-12-14 NOTE — Progress Notes (Signed)
[facility] Progress Note    Assessment/ Plan:   ESRD  -outpatient HD orders: [facility].  MWF. 3 hours 45 minutes.  EDW 58.8 kg.  Access: LUE AVF, flow rates: 400/autoflow 1.5, 15g. 2k, 2.5cal. Heparin 2000 units bolus. Meds: Venofer 50mg  once weekly, calcitriol 0.48mcg with HD -On holiday sched this week: Sun, Tues, Fri. Next HD Friday if still here vs outpatient   Focal seizure -per primary and neuro   Volume/ hypertension  -UF as tolerated   Anemia of Chronic Kidney Disease Hemoglobin 11.2-acceptable  -Transfuse PRN for Hgb <7   Secondary Hyperparathyroidism/Hyperphosphatemia -resumed calcitriol. Resume home binders if on any. Chk PO4   Subjective:   Patient seen and examined bedside. No acute events. She reports that she tolerated HD yesterday with net uf 2L. Possible discharge today?   Objective:   BP (!) 102/45 (BP Location: Right Arm)   Pulse 70   Temp 97.8 F (36.6 C) (Oral)   Resp 20   Ht 5\' 2"  (1.575 m)   Wt 72.1 kg   SpO2 100%   BMI 29.08 kg/m   Intake/Output Summary (Last 24 hours) at 12/14/2022 5621 Last data filed at 12/13/2022 2302 Gross per 24 hour  Intake 240 ml  Output 2000 ml  Net -1760 ml   Weight change:   Physical Exam: Gen: NAD CVS: RRR Resp: normal wob, unlabored Abd: soft, nd Ext: no edema Neuro: awake, alert Dialysis access: LUE AVF +b/t  Imaging: EEG adult  Result Date: 12/13/2022 Charlsie Quest, MD     12/13/2022  8:47 AM Patient Name: [name] MRN: [id number] Epilepsy Attending: Charlsie Quest Referring Physician/Provider: Gordy Councilman, MD Date: 12/13/2022 Duration: 22.56 mins Patient history: 82 yo M with jerking of her right arm and her left leg was much weaker than normal. The left leg also had some jerking as well but not necessarily at the same time as the arm. EEG to evaluate for seizure Level of alertness: Awake, asleep AEDs during EEG study: LEV Technical aspects: This EEG study was done with  scalp electrodes positioned according to the 10-20 International system of electrode placement. Electrical activity was reviewed with band pass filter of 1-70Hz , sensitivity of 7 uV/mm, display speed of 73mm/sec with a 60Hz  notched filter applied as appropriate. EEG data were recorded continuously and digitally stored.  Video monitoring was available and reviewed as appropriate. Description: The posterior dominant rhythm consists of 8-9 Hz activity of moderate voltage (25-35 uV) seen predominantly in posterior head regions, symmetric and reactive to eye opening and eye closing. Sleep was characterized by vertex waves, sleep spindles (12 to 14 Hz), maximal frontocentral region. EEG showed continuous 3 to 6 Hz theta-delta slowing in right posterior temporal region. Hyperventilation and photic stimulation were not performed.   ABNORMALITY - Continuous slow, right posterior temporal region IMPRESSION: This study is suggestive of cortical dysfunction arising from right posterior temporal region likely secondary to underlying stroke. No seizures or epileptiform discharges were seen throughout the recording. Charlsie Quest   MR BRAIN WO CONTRAST  Result Date: 12/13/2022 CLINICAL DATA:  Initial evaluation for neuro deficit, stroke suspected. EXAM: MRI HEAD WITHOUT CONTRAST TECHNIQUE: Multiplanar, multiecho pulse sequences of the brain and surrounding structures were obtained without intravenous contrast. COMPARISON:  CT from earlier the same day. FINDINGS: Brain: Generalized age-related cerebral atrophy. Patchy T2/FLAIR hyperintensity involving the periventricular and deep white matter both cerebral hemispheres, consistent with chronic small vessel ischemic disease. Encephalomalacia and gliosis involving  the right frontal lobe, consistent with a chronic right MCA distribution infarct. Additional chronic right PCA distribution infarct involving the right occipital lobe. Chronic hemosiderin staining noted at this  location. Additional suspected chronic right ACA distribution infarct with chronic hemosiderin staining noted as well. Few tiny remote bilateral cerebellar infarcts noted. No evidence for acute or subacute ischemia. No acute intracranial hemorrhage. No mass lesion, midline shift or mass effect. No hydrocephalus or extra-axial fluid collection. Pituitary gland and suprasellar region within normal limits. Vascular: Major intracranial vascular flow voids are maintained. Susceptibility artifact related to the vascular stent at the right M1 segment noted. Skull and upper cervical spine: Craniocervical junction within normal limits. Bone marrow signal intensity within normal limits. No scalp soft tissue abnormality. Sinuses/Orbits: Prior bilateral ocular lens replacement. Paranasal sinuses are largely clear. No mastoid effusion. Other: None. IMPRESSION: 1. No acute intracranial abnormality. 2. Chronic right MCA, right PCA, and right ACA distribution infarcts. 3. Underlying age-related cerebral atrophy with chronic small vessel ischemic disease. Electronically Signed   By: Rise Mu [name]   On: 12/13/2022 00:37   CT HEAD WO CONTRAST  Result Date: 12/12/2022 CLINICAL DATA:  Neuro deficit, acute, stroke suspected EXAM: CT HEAD WITHOUT CONTRAST TECHNIQUE: Contiguous axial images were obtained from the base of the skull through the vertex without intravenous contrast. RADIATION DOSE REDUCTION: This exam was performed according to the departmental dose-optimization program which includes automated exposure control, adjustment of the mA and/or kV according to patient size and/or use of iterative reconstruction technique. COMPARISON:  None Available. FINDINGS: Brain: Old right occipital and posterior frontal infarcts, unchanged. No acute intracranial abnormality. Specifically, no hemorrhage, hydrocephalus, mass lesion, acute infarction, or significant intracranial injury. Vascular: No hyperdense vessel or  unexpected calcification. Skull: No acute calvarial abnormality. Sinuses/Orbits: No acute findings Other: None IMPRESSION: Old right side infarcts as above. No acute intracranial abnormality. Electronically Signed   By: Charlett Nose [name]   On: 12/12/2022 18:05    Labs: BMET Recent Labs  Lab 12/12/22 1623 12/12/22 1631 12/13/22 0836 12/14/22 0512  NA 134* 134* 135 135  K 3.8 3.6 4.7 3.4*  CL 96* 97* 97* 96*  CO2 25  --  27 26  GLUCOSE 292* 280* 207* 160*  BUN 27* 28* 35* 14  CREATININE 4.33* 4.40* 4.87* 2.99*  CALCIUM 9.0  --  9.1 8.5*   CBC Recent Labs  Lab 12/12/22 1623 12/12/22 1631 12/13/22 0836 12/14/22 0512  WBC 10.0  --  8.4 7.4  NEUTROABS 6.7  --   --   --   HGB 11.1* 12.2 10.7* 11.2*  HCT 34.8* 36.0 33.2* 34.6*  MCV 93.5  --  93.8 94.8  PLT 374  --  290 312    Medications:     aspirin EC  81 mg Oral Daily   [START ON 12/15/2022] calcitRIOL  0.75 mcg Oral Q T,Th,Sa-HD   Chlorhexidine Gluconate Cloth  6 each Topical Q0600   folic acid  1 mg Oral Daily   heparin  5,000 Units Subcutaneous Q8H   hydrALAZINE  50 mg Oral TID   insulin aspart  0-5 Units Subcutaneous QHS   insulin aspart  0-6 Units Subcutaneous TID WC   insulin glargine-yfgn  5 Units Subcutaneous QHS   isosorbide mononitrate  240 mg Oral Daily   levETIRAcetam  500 mg Oral QHS   And   levETIRAcetam  250 mg Oral Q M,W,F   living well with diabetes book   Does not apply Once  losartan  25 mg Oral QHS   pantoprazole  40 mg Oral Q1200   sodium chloride flush  3 mL Intravenous Once   sodium chloride flush  3 mL Intravenous Q12H   venlafaxine XR  75 mg Oral Daily      Anthony Sar, MD [facility] 12/14/2022, 9:42 AM

## 2022-12-14 NOTE — Discharge Summary (Signed)
Physician Discharge Summary   Patient: LATORI MURTY MRN: [id number] DOB: 1940-04-13  Admit date:     12/12/2022  Discharge date: 12/14/22  Discharge Physician: Debarah Crape   PCP: Harvest Forest, MD   Recommendations at discharge:  We have made some changes to your blood pressure and diabetes medications.  Follow-up with your primary care physician to review these changes Please resume your normal hemodialysis outpatient on 11/28 Follow-up with neurology in 4 to 6 weeks  Discharge Diagnoses: Principal Problem:   Focal seizure (HCC) Active Problems:   Coronary artery disease involving native coronary artery of native heart without angina pectoris   DM (diabetes mellitus), type 2 with renal complications (HCC)   Essential hypertension   ESRD on dialysis [facility])   History of stroke  Resolved Problems:   * No resolved hospital problems. [facility] Course: This patient is an 82 year old female with ESRD on hemodialysis Monday Wednesday Friday, diabetes, hypertension, hyperlipidemia, CAD, severe aortic stenosis status post TAVR, prior CVAs with residual mild left hemiparesis, OSA with CPAP intolerance, anxiety/depression who presented to the ED on 11/25 complaining of jerking and weakness in her extremities.  Patient recently completed antibiotic for sinus infection and had impaired sleep during the course of this infection.  In the ED patient was seen to have some left-sided jerking which improved with IV Valium.  She underwent CT head and brain MRI which were unremarkable for acute findings but redemonstrated chronic right MCA.  Neurology was consulted who suspects that patient's seizure threshold was lowered in setting of recent sinus infection and poor sleep.  She was started on Keppra and AEDs were titrated.  EEG was obtained which showed cortical dysfunction arising from right posterior temporal region likely secondary to the underlying stroke.  No seizure or epileptiform  discharges were seen.  She will be discharging on 500 mg Keppra twice daily with an additional 250 mg dose after dialysis sessions Monday Wednesday Friday as per the recommendations of neurology. Patient received hemodialysis during her stay with last dialysis session on 11/26 and plans to resume dialysis on 11/28 outpatient.  On 11/27 had extensive discussion with the patient, as well as her sister and brother regarding discharge plans.  They report that she is established with a caretaker outpatient and they would like to discharge directly home.  We did discuss that her blood pressure medications and diabetes medications need to be further titrated and they will follow-up with primary care physician next week to do so.    Consultants: Nephrology, Neurology Procedures performed: EEG  Disposition: Home Diet recommendation:  Discharge Diet Orders (From admission, onward)     Start     Ordered   12/14/22 0000  Diet - low sodium heart healthy        12/14/22 1254           Renal diet DISCHARGE MEDICATION: Allergies as of 12/14/2022   No Known Allergies      Medication List     STOP taking these medications    Acetaminophen 500 MG capsule   amoxicillin 500 MG tablet Commonly known as: AMOXIL   labetalol 200 MG tablet Commonly known as: NORMODYNE   Repatha SureClick 140 MG/ML Soaj Generic drug: Evolocumab       TAKE these medications    albuterol 108 (90 Base) MCG/ACT inhaler Commonly known as: VENTOLIN HFA Inhale 2 puffs into the lungs every 6 (six) hours as needed for wheezing or shortness of breath.   aspirin  EC 81 MG tablet Take 81 mg by mouth daily.   benzonatate 100 MG capsule Commonly known as: TESSALON Take 100 mg by mouth 3 (three) times daily as needed for cough.   brimonidine 0.2 % ophthalmic solution Commonly known as: ALPHAGAN Place 1 drop into the left eye daily.   folic acid 1 MG tablet Commonly known as: FOLVITE Take 1 mg by mouth daily.    HumaLOG KwikPen 100 UNIT/ML KwikPen Generic drug: insulin lispro Inject 1-5 Units into the skin See admin instructions. Inject 1-5 units 3 times daily with meals and at bedtime. Dose depends on what pt is eating.   hydrALAZINE 50 MG tablet Commonly known as: APRESOLINE Take 1 tablet (50 mg total) by mouth 3 (three) times daily. What changed:  medication strength how much to take   Insulin Degludec 100 UNIT/ML Soln Commonly known as: Tresiba Inject 10 Units into the skin at bedtime.   isosorbide mononitrate 120 MG 24 hr tablet Commonly known as: IMDUR TAKE 2 TABLETS BY MOUTH DAILY   levETIRAcetam 250 MG tablet Commonly known as: KEPPRA Take 2 tablets (500 mg total) by mouth 2 (two) times daily AND 1 tablet (250 mg total) every Monday, Wednesday, and Friday. After dialysis.   losartan 25 MG tablet Commonly known as: COZAAR Take 1 tablet (25 mg total) by mouth at bedtime.   metoCLOPramide 10 MG tablet Commonly known as: REGLAN Take 1 tablet (10 mg total) by mouth every 6 (six) hours.   pantoprazole 40 MG tablet Commonly known as: PROTONIX Take 1 tablet (40 mg total) by mouth daily at 12 noon.   venlafaxine XR 150 MG 24 hr capsule Commonly known as: EFFEXOR-XR Take 1 capsule (150 mg total) by mouth daily.        Discharge Exam: Filed Weights   12/12/22 2032  Weight: 72.1 kg   Constitutional:  Normal appearance. Non toxic-appearing. Drowsy but arousable. HENT: Head Normocephalic and atraumatic.  Mucous membranes are moist.  Eyes:  Extraocular intact. Conjunctivae normal. Pupils are equal, round, and reactive to light.  Cardiovascular: Rate and Rhythm: Normal rate and regular rhythm.  Pulmonary: Non labored, symmetric rise of chest wall.  Musculoskeletal:  Normal range of motion.  Skin: warm and dry. not jaundiced.  Neurological: No focal deficit present. Oriented. Psychiatric: Mood and Affect congruent.    Condition at discharge: stable  The results of  significant diagnostics from this hospitalization (including imaging, microbiology, ancillary and laboratory) are listed below for reference.   Imaging Studies: EEG adult  Result Date: Jan 05, 2023 Charlsie Quest, MD     2023-01-05  8:47 AM Patient Name: WYNONNA OESTREICHER MRN: [id number] Epilepsy Attending: Charlsie Quest Referring Physician/Provider: Gordy Councilman, MD Date: 01/05/23 Duration: 22.56 mins Patient history: 82 yo M with jerking of her right arm and her left leg was much weaker than normal. The left leg also had some jerking as well but not necessarily at the same time as the arm. EEG to evaluate for seizure Level of alertness: Awake, asleep AEDs during EEG study: LEV Technical aspects: This EEG study was done with scalp electrodes positioned according to the 10-20 International system of electrode placement. Electrical activity was reviewed with band pass filter of 1-70Hz , sensitivity of 7 uV/mm, display speed of 46mm/sec with a 60Hz  notched filter applied as appropriate. EEG data were recorded continuously and digitally stored.  Video monitoring was available and reviewed as appropriate. Description: The posterior dominant rhythm consists of 8-9 Hz activity of moderate  voltage (25-35 uV) seen predominantly in posterior head regions, symmetric and reactive to eye opening and eye closing. Sleep was characterized by vertex waves, sleep spindles (12 to 14 Hz), maximal frontocentral region. EEG showed continuous 3 to 6 Hz theta-delta slowing in right posterior temporal region. Hyperventilation and photic stimulation were not performed.   ABNORMALITY - Continuous slow, right posterior temporal region IMPRESSION: This study is suggestive of cortical dysfunction arising from right posterior temporal region likely secondary to underlying stroke. No seizures or epileptiform discharges were seen throughout the recording. Charlsie Quest   MR BRAIN WO CONTRAST  Result Date: 12/13/2022 CLINICAL  DATA:  Initial evaluation for neuro deficit, stroke suspected. EXAM: MRI HEAD WITHOUT CONTRAST TECHNIQUE: Multiplanar, multiecho pulse sequences of the brain and surrounding structures were obtained without intravenous contrast. COMPARISON:  CT from earlier the same day. FINDINGS: Brain: Generalized age-related cerebral atrophy. Patchy T2/FLAIR hyperintensity involving the periventricular and deep white matter both cerebral hemispheres, consistent with chronic small vessel ischemic disease. Encephalomalacia and gliosis involving the right frontal lobe, consistent with a chronic right MCA distribution infarct. Additional chronic right PCA distribution infarct involving the right occipital lobe. Chronic hemosiderin staining noted at this location. Additional suspected chronic right ACA distribution infarct with chronic hemosiderin staining noted as well. Few tiny remote bilateral cerebellar infarcts noted. No evidence for acute or subacute ischemia. No acute intracranial hemorrhage. No mass lesion, midline shift or mass effect. No hydrocephalus or extra-axial fluid collection. Pituitary gland and suprasellar region within normal limits. Vascular: Major intracranial vascular flow voids are maintained. Susceptibility artifact related to the vascular stent at the right M1 segment noted. Skull and upper cervical spine: Craniocervical junction within normal limits. Bone marrow signal intensity within normal limits. No scalp soft tissue abnormality. Sinuses/Orbits: Prior bilateral ocular lens replacement. Paranasal sinuses are largely clear. No mastoid effusion. Other: None. IMPRESSION: 1. No acute intracranial abnormality. 2. Chronic right MCA, right PCA, and right ACA distribution infarcts. 3. Underlying age-related cerebral atrophy with chronic small vessel ischemic disease. Electronically Signed   By: Rise Mu [name]   On: 12/13/2022 00:37   CT HEAD WO CONTRAST  Result Date: 12/12/2022 CLINICAL DATA:   Neuro deficit, acute, stroke suspected EXAM: CT HEAD WITHOUT CONTRAST TECHNIQUE: Contiguous axial images were obtained from the base of the skull through the vertex without intravenous contrast. RADIATION DOSE REDUCTION: This exam was performed according to the departmental dose-optimization program which includes automated exposure control, adjustment of the mA and/or kV according to patient size and/or use of iterative reconstruction technique. COMPARISON:  None Available. FINDINGS: Brain: Old right occipital and posterior frontal infarcts, unchanged. No acute intracranial abnormality. Specifically, no hemorrhage, hydrocephalus, mass lesion, acute infarction, or significant intracranial injury. Vascular: No hyperdense vessel or unexpected calcification. Skull: No acute calvarial abnormality. Sinuses/Orbits: No acute findings Other: None IMPRESSION: Old right side infarcts as above. No acute intracranial abnormality. Electronically Signed   By: Charlett Nose [name]   On: 12/12/2022 18:05    Microbiology: Results for orders placed or performed during the hospital encounter of 12/12/22  MRSA Next Gen by PCR, Nasal     Status: None   Collection Time: 12/13/22  6:05 AM   Specimen: Nasal Mucosa; Nasal Swab  Result Value Ref Range Status   MRSA by PCR Next Gen NOT DETECTED NOT DETECTED Final    Comment: (NOTE) The GeneXpert MRSA Assay (FDA approved for NASAL specimens only), is one component of a comprehensive MRSA colonization surveillance program. It is not  intended to diagnose MRSA infection nor to guide or monitor treatment for MRSA infections. Test performance is not FDA approved in patients less than 16 years old. Performed at [facility], [address] [place], [place] [address]     Labs: CBC: Recent Labs  Lab 12/12/22 1623 12/12/22 1631 12/13/22 0836 12/14/22 0512  WBC 10.0  --  8.4 7.4  NEUTROABS 6.7  --   --   --   HGB 11.1* 12.2 10.7* 11.2*  HCT 34.8* 36.0 33.2* 34.6*  MCV  93.5  --  93.8 94.8  PLT 374  --  290 312   Basic Metabolic Panel: Recent Labs  Lab 12/12/22 1623 12/12/22 1631 12/13/22 0836 12/14/22 0512  NA 134* 134* 135 135  K 3.8 3.6 4.7 3.4*  CL 96* 97* 97* 96*  CO2 25  --  27 26  GLUCOSE 292* 280* 207* 160*  BUN 27* 28* 35* 14  CREATININE 4.33* 4.40* 4.87* 2.99*  CALCIUM 9.0  --  9.1 8.5*  MG  --   --  2.0  --    Liver Function Tests: Recent Labs  Lab 12/12/22 1623  AST 26  ALT 15  ALKPHOS 69  BILITOT 0.5  PROT 6.3*  ALBUMIN 3.2*   CBG: Recent Labs  Lab 12/13/22 1055 12/13/22 1627 12/14/22 0051 12/14/22 0629 12/14/22 1229  GLUCAP 162* 383* 139* 187* 202*    Discharge time spent: greater than 30 minutes.  Signed: Debarah Crape, DO [facility] 12/14/2022

## 2022-12-14 NOTE — Progress Notes (Addendum)
D/C order noted. Contacted [facility] to advise clinic of pt's d/c today and that pt should resume care on Friday.   Olivia Canter Renal Navigator [phone]  Addendum at 1:42 pm: Advised by pt's home clinic that pt was scheduled to treat at a [facility] clinic in [place] on Friday and resume at home clinic on Monday. Clinic staff tried to call pt but unable to reach pt. Navigator attempted to call pt on hospital phone to inquire about plans for Friday but was unable to reach pt. [facility] will keep pt's plans as they are unless they hear differently from pt.

## 2022-12-14 NOTE — TOC Transition Note (Signed)
Transition of Care [facility]) - CM/SW Discharge Note   Patient Details  Name: [name] MRN: [id number] Date of Birth: 06/07/1940  Transition of Care [facility]) CM/SW Contact:  Kermit Balo, RN Phone Number: 12/14/2022, 1:28 PM   Clinical Narrative:     Pt is discharging home with self care.  No needs per TOC.  Final next level of care: Home/Self Care Barriers to Discharge: No Barriers Identified   Patient Goals and CMS Choice      Discharge Placement                         Discharge Plan and Services Additional resources added to the After Visit Summary for     Discharge Planning Services: CM Consult                                 Social Determinants of Health (SDOH) Interventions SDOH Screenings   Food Insecurity: No Food Insecurity (12/13/2022)  Housing: Low Risk  (12/13/2022)  Transportation Needs: No Transportation Needs (12/13/2022)  Utilities: Not At Risk (12/13/2022)  Depression (PHQ2-9): Medium Risk (03/20/2020)  Tobacco Use: Low Risk  (12/13/2022)     Readmission Risk Interventions    09/09/2021   10:21 AM  Readmission Risk Prevention Plan  Transportation Screening Complete  PCP or Specialist Appt within 5-7 Days Complete  Home Care Screening Complete  Medication Review (RN CM) Complete

## 2022-12-14 NOTE — Plan of Care (Signed)
Pt alert and oriented x 4. Denies any pain or discomfort. IV was patent and removed for discharge. Anuric. NO BM this shift. Pt ambulates with walker in room. Family at bedside. Discharge instruction completed with pt. All questions and concerns answered. Pt stable upon discharge. Problem: Education: Goal: Ability to describe self-care measures that may prevent or decrease complications (Diabetes Survival Skills Education) will improve Outcome: Progressing Goal: Individualized Educational Video(s) Outcome: Progressing   Problem: Coping: Goal: Ability to adjust to condition or change in health will improve Outcome: Progressing   Problem: Fluid Volume: Goal: Ability to maintain a balanced intake and output will improve Outcome: Progressing   Problem: Health Behavior/Discharge Planning: Goal: Ability to identify and utilize available resources and services will improve Outcome: Progressing Goal: Ability to manage health-related needs will improve Outcome: Progressing   Problem: Metabolic: Goal: Ability to maintain appropriate glucose levels will improve Outcome: Progressing   Problem: Nutritional: Goal: Maintenance of adequate nutrition will improve Outcome: Progressing Goal: Progress toward achieving an optimal weight will improve Outcome: Progressing   Problem: Skin Integrity: Goal: Risk for impaired skin integrity will decrease Outcome: Progressing   Problem: Tissue Perfusion: Goal: Adequacy of tissue perfusion will improve Outcome: Progressing   Problem: Education: Goal: Knowledge of General Education information will improve Description: Including pain rating scale, medication(s)/side effects and non-pharmacologic comfort measures Outcome: Progressing   Problem: Health Behavior/Discharge Planning: Goal: Ability to manage health-related needs will improve Outcome: Progressing   Problem: Clinical Measurements: Goal: Ability to maintain clinical measurements within  normal limits will improve Outcome: Progressing Goal: Will remain free from infection Outcome: Progressing Goal: Diagnostic test results will improve Outcome: Progressing Goal: Respiratory complications will improve Outcome: Progressing Goal: Cardiovascular complication will be avoided Outcome: Progressing   Problem: Activity: Goal: Risk for activity intolerance will decrease Outcome: Progressing   Problem: Nutrition: Goal: Adequate nutrition will be maintained Outcome: Progressing   Problem: Coping: Goal: Level of anxiety will decrease Outcome: Progressing   Problem: Elimination: Goal: Will not experience complications related to bowel motility Outcome: Progressing Goal: Will not experience complications related to urinary retention Outcome: Progressing   Problem: Pain Management: Goal: General experience of comfort will improve Outcome: Progressing   Problem: Safety: Goal: Ability to remain free from injury will improve Outcome: Progressing   Problem: Skin Integrity: Goal: Risk for impaired skin integrity will decrease Outcome: Progressing

## 2022-12-14 NOTE — Inpatient Diabetes Management (Signed)
Inpatient Diabetes Program Recommendations  AACE/ADA: New Consensus Statement on Inpatient Glycemic Control (2015)  Target Ranges:  Prepandial:   less than 140 mg/dL      Peak postprandial:   less than 180 mg/dL (1-2 hours)      Critically ill patients:  140 - 180 mg/dL   Lab Results  Component Value Date   GLUCAP 187 (H) 12/14/2022   HGBA1C 9.6 (H) 12/14/2022    Review of Glycemic Control  Latest Reference Range & Units 12/13/22 16:27 12/14/22 00:51 12/14/22 06:29  Glucose-Capillary 70 - 99 mg/dL 161 (H) 096 (H) 045 (H)  (H): Data is abnormally high  Diabetes history: DM/ESRD Outpatient Diabetes medications: Lantus 15 units every day, Novolog 1-5 units TID, Freestyle Libre 2 Current orders for Inpatient glycemic control: Semglee 5 units every day, Novolog 0-6 units TID and 0-5 units QHS  Spoke with patient at bedside.  Reviewed patient's current A1c of 9.3%. Explained what a A1c is and what it measures. Also reviewed goal A1c with patient, importance of good glucose control @ home, and blood sugar goals.   She is current with her PCP.  She states her A1C was 13 % last week with her PCP.  He increased her basal insulin from 10 units every day to 15 units every day.    Educated on The Plate Method, CHO's, portion control, CBGs at home fasting and mid afternoon, F/U with PCP every 3 months, bring meter to PCP office, long and short term complications of uncontrolled BG, and importance of exercise.  Ordered the [facility] booklet.  Will continue to follow while inpatient.  Thank you, Dulce Sellar, MSN, CDCES Diabetes Coordinator Inpatient Diabetes Program [phone] (team pager from 8a-5p)

## 2022-12-14 NOTE — Plan of Care (Signed)
Problem: Education: Goal: Knowledge of General Education information will improve Description: Including pain rating scale, medication(s)/side effects and non-pharmacologic comfort measures Outcome: Progressing   Problem: Health Behavior/Discharge Planning: Goal: Ability to manage health-related needs will improve Outcome: Progressing   Problem: Clinical Measurements: Goal: Ability to maintain clinical measurements within normal limits will improve Outcome: Progressing   Problem: Activity: Goal: Risk for activity intolerance will decrease Outcome: Progressing   Problem: Nutrition: Goal: Adequate nutrition will be maintained Outcome: Progressing   Problem: Pain Management: Goal: General experience of comfort will improve Outcome: Progressing   Problem: Safety: Goal: Ability to remain free from injury will improve Outcome: Progressing

## 2022-12-14 NOTE — Discharge Planning (Signed)
[facility] Patient Discharge Orders- [facility] CLINIC: [place]  Patient's name: ARITA SAFFOLD Admit/DC Dates: 12/12/2022 - 12/14/2022  Discharge Diagnoses: Suspected Focal Seizure     Aranesp: Given: NA   Date and amount of last dose: NA  Last Hgb: 11.2 PRBC's Given: NA Date/# of units: NA ESA dose for discharge: mircera 0 mcg IV q 2 weeks  IV Iron dose at discharge: Continue current order  Heparin change: NA  EDW Change: No New EDW: NA  [place] Change: NA  Access intervention/Change: NA Details:  Hectorol/Calcitriol change: NA  Discharge Labs: Calcium 8.5 Phosphorus NA Albumin 3.2 K+ 3.4  IV Antibiotics: NA Details:  On Coumadin?: NA Last INR: Next INR: Managed By:   OTHER/APPTS/LAB ORDERS:    D/C Meds to be reconciled by nurse after every discharge.  Completed By: Alonna Buckler [facility] [phone]   Reviewed by: MD:______ RN_______

## 2023-02-16 ENCOUNTER — Emergency Department (HOSPITAL_COMMUNITY): Payer: Medicare PPO

## 2023-02-16 ENCOUNTER — Other Ambulatory Visit: Payer: Self-pay

## 2023-02-16 ENCOUNTER — Emergency Department (HOSPITAL_COMMUNITY)
Admission: EM | Admit: 2023-02-16 | Discharge: 2023-02-16 | Disposition: A | Discharge status: Home / Self Care | Payer: Medicare PPO | Attending: Emergency Medicine | Admitting: Emergency Medicine

## 2023-02-16 DIAGNOSIS — Z7982 Long term (current) use of aspirin: Secondary | ICD-10-CM | POA: Diagnosis not present

## 2023-02-16 DIAGNOSIS — Z794 Long term (current) use of insulin: Secondary | ICD-10-CM | POA: Insufficient documentation

## 2023-02-16 DIAGNOSIS — Z79899 Other long term (current) drug therapy: Secondary | ICD-10-CM | POA: Insufficient documentation

## 2023-02-16 DIAGNOSIS — D72829 Elevated white blood cell count, unspecified: Secondary | ICD-10-CM | POA: Diagnosis not present

## 2023-02-16 DIAGNOSIS — Z8673 Personal history of transient ischemic attack (TIA), and cerebral infarction without residual deficits: Secondary | ICD-10-CM | POA: Insufficient documentation

## 2023-02-16 DIAGNOSIS — J181 Lobar pneumonia, unspecified organism: Secondary | ICD-10-CM | POA: Diagnosis not present

## 2023-02-16 DIAGNOSIS — N186 End stage renal disease: Secondary | ICD-10-CM | POA: Diagnosis not present

## 2023-02-16 DIAGNOSIS — Z992 Dependence on renal dialysis: Secondary | ICD-10-CM | POA: Insufficient documentation

## 2023-02-16 DIAGNOSIS — J189 Pneumonia, unspecified organism: Secondary | ICD-10-CM

## 2023-02-16 DIAGNOSIS — I120 Hypertensive chronic kidney disease with stage 5 chronic kidney disease or end stage renal disease: Secondary | ICD-10-CM | POA: Insufficient documentation

## 2023-02-16 DIAGNOSIS — R531 Weakness: Secondary | ICD-10-CM | POA: Diagnosis present

## 2023-02-16 LAB — I-STAT CHEM 8, ED
BUN: 46 mg/dL — ABNORMAL HIGH (ref 8–23)
Calcium, Ion: 1.06 mmol/L — ABNORMAL LOW (ref 1.15–1.40)
Chloride: 94 mmol/L — ABNORMAL LOW (ref 98–111)
Creatinine, Ser: 4.8 mg/dL — ABNORMAL HIGH (ref 0.44–1.00)
Glucose, Bld: 188 mg/dL — ABNORMAL HIGH (ref 70–99)
HCT: 35 % — ABNORMAL LOW (ref 36.0–46.0)
Hemoglobin: 11.9 g/dL — ABNORMAL LOW (ref 12.0–15.0)
Potassium: 3.9 mmol/L (ref 3.5–5.1)
Sodium: 134 mmol/L — ABNORMAL LOW (ref 135–145)
TCO2: 29 mmol/L (ref 22–32)

## 2023-02-16 LAB — COMPREHENSIVE METABOLIC PANEL
ALT: 33 U/L (ref 0–44)
AST: 38 U/L (ref 15–41)
Albumin: 2.8 g/dL — ABNORMAL LOW (ref 3.5–5.0)
Alkaline Phosphatase: 55 U/L (ref 38–126)
Anion gap: 14 (ref 5–15)
BUN: 47 mg/dL — ABNORMAL HIGH (ref 8–23)
CO2: 25 mmol/L (ref 22–32)
Calcium: 8.7 mg/dL — ABNORMAL LOW (ref 8.9–10.3)
Chloride: 94 mmol/L — ABNORMAL LOW (ref 98–111)
Creatinine, Ser: 4.7 mg/dL — ABNORMAL HIGH (ref 0.44–1.00)
GFR, Estimated: 9 mL/min — ABNORMAL LOW (ref >60)
Glucose, Bld: 191 mg/dL — ABNORMAL HIGH (ref 70–99)
Potassium: 3.8 mmol/L (ref 3.5–5.1)
Sodium: 133 mmol/L — ABNORMAL LOW (ref 135–145)
Total Bilirubin: 0.7 mg/dL (ref 0.0–1.2)
Total Protein: 6.1 g/dL — ABNORMAL LOW (ref 6.5–8.1)

## 2023-02-16 LAB — CBC WITH DIFFERENTIAL/PLATELET
Abs Immature Granulocytes: 0.08 10*3/uL — ABNORMAL HIGH (ref 0.00–0.07)
Basophils Absolute: 0 10*3/uL (ref 0.0–0.1)
Basophils Relative: 0 %
Eosinophils Absolute: 0 10*3/uL (ref 0.0–0.5)
Eosinophils Relative: 0 %
HCT: 33.5 % — ABNORMAL LOW (ref 36.0–46.0)
Hemoglobin: 11.2 g/dL — ABNORMAL LOW (ref 12.0–15.0)
Immature Granulocytes: 1 %
Lymphocytes Relative: 16 %
Lymphs Abs: 2.5 10*3/uL (ref 0.7–4.0)
MCH: 30.3 pg (ref 26.0–34.0)
MCHC: 33.4 g/dL (ref 30.0–36.0)
MCV: 90.5 fL (ref 80.0–100.0)
Monocytes Absolute: 0.8 10*3/uL (ref 0.1–1.0)
Monocytes Relative: 5 %
Neutro Abs: 12.5 10*3/uL — ABNORMAL HIGH (ref 1.7–7.7)
Neutrophils Relative %: 78 %
Platelets: 219 10*3/uL (ref 150–400)
RBC: 3.7 MIL/uL — ABNORMAL LOW (ref 3.87–5.11)
RDW: 12.9 % (ref 11.5–15.5)
WBC: 15.9 10*3/uL — ABNORMAL HIGH (ref 4.0–10.5)
nRBC: 0 % (ref 0.0–0.2)

## 2023-02-16 LAB — CBG MONITORING, ED: Glucose-Capillary: 176 mg/dL — ABNORMAL HIGH (ref 70–99)

## 2023-02-16 MED ORDER — LORAZEPAM 2 MG/ML IJ SOLN
1.0000 mg | Freq: Once | INTRAMUSCULAR | Status: AC
  Administered 2023-02-16: 1 mg via INTRAVENOUS
  Filled 2023-02-16: qty 1

## 2023-02-16 MED ORDER — DIAZEPAM 5 MG/ML IJ SOLN: 5.0000 mg | Freq: Once | INTRAMUSCULAR | Status: DC

## 2023-02-16 MED ORDER — DOXYCYCLINE HYCLATE 100 MG PO TABS
100.0000 mg | ORAL_TABLET | Freq: Once | ORAL | Status: AC
  Administered 2023-02-16: 100 mg via ORAL
  Filled 2023-02-16: qty 1

## 2023-02-16 MED ORDER — DOXYCYCLINE HYCLATE 100 MG PO CAPS: 100.0000 mg | ORAL_CAPSULE | Freq: Two times a day (BID) | ORAL | 0 refills | Status: DC

## 2023-02-16 MED ORDER — LORAZEPAM 2 MG/ML IJ SOLN
2.0000 mg | Freq: Once | INTRAMUSCULAR | Status: DC
  Filled 2023-02-16: qty 1

## 2023-02-16 NOTE — Discharge Instructions (Signed)
Take doxycycline twice daily for a week for possible pneumonia   Your MRI showed no stroke.   See your doctor for follow up   Return to ER if you have fever, cough, trouble breathing, weakness

## 2023-02-16 NOTE — ED Notes (Signed)
Patient verbalizes understanding of discharge instructions. Opportunity for questioning and answers were provided.   Prescriptions reviewed and pharmacy verified.   Patient discharged with family.

## 2023-02-16 NOTE — ED Provider Notes (Signed)
[place] EMERGENCY DEPARTMENT AT [facility] Provider Note   CSN: [id number] Arrival date & time: 02/16/23  1719     History  No chief complaint on file.   [name] is a 83 y.o. female history of hypertension, previous stroke with left-sided deficits, ESRD on dialysis (last HD was yesterday) here presenting with worsening weakness.  Since yesterday around 11 AM, patient has been having worsening left-sided weakness.  Patient is usually ambulatory by herself at baseline.  However since yesterday she has trouble walking and hard time grabbing things with the left arm.  Per the aide, she also has some trouble speaking to her daughter this morning.  Her speech seems to be improved but her weakness has not.  Patient had previous stroke several years ago.  The history is provided by the patient.       Home Medications Prior to Admission medications   Medication Sig Start Date End Date Taking? Authorizing Provider  albuterol (VENTOLIN HFA) 108 (90 Base) MCG/ACT inhaler Inhale 2 puffs into the lungs every 6 (six) hours as needed for wheezing or shortness of breath. 01/24/22   Meredeth Ide, MD  aspirin EC 81 MG tablet Take 81 mg by mouth daily.    [provider]  benzonatate (TESSALON) 100 MG capsule Take 100 mg by mouth 3 (three) times daily as needed for cough. 11/29/22   [provider]  brimonidine (ALPHAGAN) 0.2 % ophthalmic solution Place 1 drop into the left eye daily. 04/19/19   [provider]  folic acid (FOLVITE) 1 MG tablet Take 1 mg by mouth daily.    [provider]  HUMALOG KWIKPEN 100 UNIT/ML KwikPen Inject 1-5 Units into the skin See admin instructions. Inject 1-5 units 3 times daily with meals and at bedtime. Dose depends on what pt is eating. 05/28/18   [provider]  hydrALAZINE (APRESOLINE) 50 MG tablet Take 1 tablet (50 mg total) by mouth 3 (three) times daily. 12/14/22   Dezii, Alexandra, DO  Insulin Degludec  (TRESIBA) 100 UNIT/ML SOLN Inject 10 Units into the skin at bedtime. 01/24/22   Meredeth Ide, MD  isosorbide mononitrate (IMDUR) 120 MG 24 hr tablet TAKE 2 TABLETS BY MOUTH DAILY 03/04/22   Patwardhan, Anabel Bene, MD  levETIRAcetam (KEPPRA) 250 MG tablet Take 2 tablets (500 mg total) by mouth 2 (two) times daily AND 1 tablet (250 mg total) every Monday, Wednesday, and Friday. After dialysis. 12/14/22   Dezii, Alexandra, DO  losartan (COZAAR) 25 MG tablet Take 1 tablet (25 mg total) by mouth at bedtime. 01/24/22   Meredeth Ide, MD  metoCLOPramide (REGLAN) 10 MG tablet Take 1 tablet (10 mg total) by mouth every 6 (six) hours. 06/02/22   Rondel Baton, MD  pantoprazole (PROTONIX) 40 MG tablet Take 1 tablet (40 mg total) by mouth daily at 12 noon. 03/02/22   Meredeth Ide, MD  venlafaxine XR (EFFEXOR-XR) 150 MG 24 hr capsule Take 1 capsule (150 mg total) by mouth daily. 02/27/20   Jacquelynn Cree, PA-C      Allergies    Patient has no known allergies.    Review of Systems   Review of Systems  Neurological:  Positive for speech difficulty and weakness.  All other systems reviewed and are negative.   Physical Exam Updated Vital Signs BP (!) 123/53 (BP Location: Right Arm)   Pulse 60   Temp 98.4 F (36.9 C) (Oral)   Resp (!) 21  SpO2 100%  Physical Exam Vitals and nursing note reviewed.  Constitutional:      Comments: Chronically ill-appearing  HENT:     Head: Normocephalic.     Mouth/Throat:     Mouth: Mucous membranes are moist.  Eyes:     Extraocular Movements: Extraocular movements intact.     Pupils: Pupils are equal, round, and reactive to light.  Cardiovascular:     Rate and Rhythm: Normal rate and regular rhythm.     Pulses: Normal pulses.     Heart sounds: Normal heart sounds.  Pulmonary:     Effort: Pulmonary effort is normal.     Breath sounds: Normal breath sounds.  Abdominal:     General: Abdomen is flat.     Palpations: Abdomen is soft.  Musculoskeletal:         General: Normal range of motion.     Cervical back: Normal range of motion.  Skin:    General: Skin is warm.     Capillary Refill: Capillary refill takes less than 2 seconds.  Neurological:     Mental Status: She is alert.     Comments: Patient has 3 out of 5 strength on the left arm and 4 out of 5 on the left leg.  Patient is 5 out of 5 right arm and leg.   Psychiatric:        Mood and Affect: Mood normal.     ED Results / Procedures / Treatments   Labs (all labs ordered are listed, but only abnormal results are displayed) Labs Reviewed  CBC WITH DIFFERENTIAL/PLATELET - Abnormal; Notable for the following components:      Result Value   WBC 15.9 (*)    RBC 3.70 (*)    Hemoglobin 11.2 (*)    HCT 33.5 (*)    Neutro Abs 12.5 (*)    Abs Immature Granulocytes 0.08 (*)    All other components within normal limits  I-STAT CHEM 8, ED - Abnormal; Notable for the following components:   Sodium 134 (*)    Chloride 94 (*)    BUN 46 (*)    Creatinine, Ser 4.80 (*)    Glucose, Bld 188 (*)    Calcium, Ion 1.06 (*)    Hemoglobin 11.9 (*)    HCT 35.0 (*)    All other components within normal limits  CBG MONITORING, ED - Abnormal; Notable for the following components:   Glucose-Capillary 176 (*)    All other components within normal limits  COMPREHENSIVE METABOLIC PANEL    EKG EKG Interpretation Date/Time:  Thursday February 16 2023 17:46:38 EST Ventricular Rate:  61 PR Interval:  202 QRS Duration:  102 QT Interval:  484 QTC Calculation: 488 R Axis:   57  Text Interpretation: Sinus or ectopic atrial rhythm Low voltage, extremity leads LVH with secondary repolarization abnormality Borderline prolonged QT interval No significant change since last tracing Confirmed by Richardean Canal [phone]) on 02/16/2023 5:55:49 PM  Radiology No results found.  Procedures Procedures    Medications Ordered in ED Medications  LORazepam (ATIVAN) injection 1 mg (has no administration in time range)     ED Course/ Medical Decision Making/ A&P                                 Medical Decision Making [name] is a 83 y.o. female here presenting with worsening left-sided weakness.  Concern for possible  new stroke versus recrudescence of her stroke.  I discussed case with Dr. Selina Cooley from neurology.  She recommend MRI brain and MR angio of brain and neck.  Patient is a dialysis patient so we will hold off on CTA.  Will also get basic lab work as well.  10:41 PM White blood cell slightly elevated at 15.  MRI did not show a stroke.  I discussed case with Dr. Valerie Roys who states that patient does not need to be admitted for TIA workup.  Patient does have a cough and chest x-ray is clear but upon my review I see a small right lower lobe pneumonia.  Will put patient on doxycycline given leukocytosis and persistent cough.  At this point patient is stable for discharge and updated family who is at bedside  Problems Addressed: Community acquired pneumonia of right lower lobe of lung: acute illness or injury Weakness: acute illness or injury  Amount and/or Complexity of Data Reviewed Labs: ordered. Decision-making details documented in ED Course. Radiology: ordered and independent interpretation performed. Decision-making details documented in ED Course. ECG/medicine tests: ordered and independent interpretation performed. Decision-making details documented in ED Course.  Risk Prescription drug management.    Final Clinical Impression(s) / ED Diagnoses Final diagnoses:  None    Rx / DC Orders ED Discharge Orders     None         Charlynne Pander, MD 02/16/23 2242

## 2023-02-16 NOTE — ED Triage Notes (Signed)
Patient from home via EMS-home aid noted at 1100 yesterday that the patient was weaker than normal on the L side. Has hx of CVA with L sided weakness. Also reports a period of slurred speech yesterday that resolved.

## 2023-03-06 ENCOUNTER — Other Ambulatory Visit: Payer: Self-pay | Admitting: Internal Medicine

## 2023-03-06 ENCOUNTER — Ambulatory Visit
Admission: RE | Admit: 2023-03-06 | Discharge: 2023-03-06 | Disposition: A | Discharge status: Home / Self Care | Payer: Medicare PPO | Source: Ambulatory Visit | Attending: Internal Medicine | Admitting: Internal Medicine

## 2023-03-06 DIAGNOSIS — R053 Chronic cough: Secondary | ICD-10-CM

## 2023-03-21 ENCOUNTER — Ambulatory Visit (INDEPENDENT_AMBULATORY_CARE_PROVIDER_SITE_OTHER): Payer: Medicare PPO | Admitting: Diagnostic Neuroimaging

## 2023-03-21 ENCOUNTER — Encounter: Payer: Self-pay | Admitting: Diagnostic Neuroimaging

## 2023-03-21 VITALS — BP 126/64 | HR 91 | Ht 62.5 in | Wt 124.0 lb

## 2023-03-21 DIAGNOSIS — G40109 Localization-related (focal) (partial) symptomatic epilepsy and epileptic syndromes with simple partial seizures, not intractable, without status epilepticus: Secondary | ICD-10-CM | POA: Diagnosis not present

## 2023-03-21 MED ORDER — LACOSAMIDE 50 MG PO TABS: 50.0000 mg | ORAL_TABLET | Freq: Two times a day (BID) | ORAL | 5 refills | Status: DC

## 2023-03-21 NOTE — Patient Instructions (Signed)
 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

## 2023-03-21 NOTE — Progress Notes (Signed)
 [facility]  PATIENT: [name] DOB: 1940-12-31  REFERRING CLINICIAN: Debarah Crape, DO HISTORY FROM: patient  REASON FOR VISIT: new consult   HISTORICAL  CHIEF COMPLAINT:  Chief Complaint  Patient presents with   New Patient (Initial Visit)    Pt with caretaker ,rm 6 here today for new onset seizures that started first of the year. She was started on 750 mg keppra and was too drowsy since adjusted to lower dose she still has them but not as frequent. Thse typically occur after movement. Lasting 30 sec or so and doesn't loose consciousness.     HISTORY OF PRESENT ILLNESS:   83 year old female with history of end-stage renal disease on hemodialysis, prior right frontal and right occipital ischemic infarctions, here for evaluation of partial seizures.  History of right brain stroke in 2022.  Had some left-sided weakness which improved.  In October 2024 had first event of left-sided convulsions.  No loss of consciousness.  Symptoms worsened over time.  In November 2024 she went to hospital for evaluation.  She was diagnosed with focal seizures and started on levetiracetam.  Dose was increased after discharge but patient had side effects so dose was reduced to 5 mg twice a day with 5 mg supplement on day of dialysis.   REVIEW OF SYSTEMS: Full 14 system review of systems performed and negative with exception of: as per HPI.  ALLERGIES: No Known Allergies  HOME MEDICATIONS: Outpatient Medications Prior to Visit  Medication Sig Dispense Refill   aspirin EC 81 MG tablet Take 81 mg by mouth daily.     folic acid (FOLVITE) 1 MG tablet Take 1 mg by mouth daily.     HUMALOG KWIKPEN 100 UNIT/ML KwikPen Inject 1-5 Units into the skin See admin instructions. Inject 1-5 units 3 times daily with meals and at bedtime. Dose depends on what pt is eating.     hydrALAZINE (APRESOLINE) 50 MG tablet Take 1 tablet (50 mg total) by mouth 3 (three) times daily. 90 tablet 0    Insulin Degludec (TRESIBA) 100 UNIT/ML SOLN Inject 10 Units into the skin at bedtime. (Patient taking differently: Inject 8 Units into the skin at bedtime.) 10 mL 1   isosorbide mononitrate (IMDUR) 120 MG 24 hr tablet TAKE 2 TABLETS BY MOUTH DAILY 180 tablet 1   levETIRAcetam (KEPPRA) 500 MG tablet Take 500 mg by mouth 2 (two) times daily. Mon wed fri take additional 500 mg after dialysis     pantoprazole (PROTONIX) 40 MG tablet Take 1 tablet (40 mg total) by mouth daily at 12 noon. 30 tablet 1   venlafaxine (EFFEXOR) 75 MG tablet Take 75 mg by mouth daily. Takes in additional to the 150 mg capsule     venlafaxine XR (EFFEXOR-XR) 150 MG 24 hr capsule Take 1 capsule (150 mg total) by mouth daily. (Patient taking differently: Take 150 mg by mouth daily. Pt takes this in addition to the 75 mg capsule) 30 capsule 0   albuterol (VENTOLIN HFA) 108 (90 Base) MCG/ACT inhaler Inhale 2 puffs into the lungs every 6 (six) hours as needed for wheezing or shortness of breath. 8 g 0   benzonatate (TESSALON) 100 MG capsule Take 100 mg by mouth 3 (three) times daily as needed for cough.     brimonidine (ALPHAGAN) 0.2 % ophthalmic solution Place 1 drop into the left eye daily.     doxycycline (VIBRAMYCIN) 100 MG capsule Take 1 capsule (100 mg total) by mouth 2 (two)  times daily. One po bid x 7 days 14 capsule 0   levETIRAcetam (KEPPRA) 250 MG tablet Take 2 tablets (500 mg total) by mouth 2 (two) times daily AND 1 tablet (250 mg total) every Monday, Wednesday, and Friday. After dialysis. 150 tablet 0   losartan (COZAAR) 25 MG tablet Take 1 tablet (25 mg total) by mouth at bedtime. 30 tablet 2   metoCLOPramide (REGLAN) 10 MG tablet Take 1 tablet (10 mg total) by mouth every 6 (six) hours. 12 tablet 0   No facility-administered medications prior to visit.    PAST MEDICAL HISTORY: Past Medical History:  Diagnosis Date   Arthritis    Cerebrovascular disease    CHF (congestive heart failure) (HCC)    CKD (chronic  kidney disease)    Sees Dr Signe Colt   Coronary artery disease    Depression    Diabetes (HCC)    INSULIN DEPENDENT   Diabetic peripheral neuropathy (HCC)    Diabetic retinopathy (HCC)    Diverticulitis    GERD (gastroesophageal reflux disease)    History of CVA (cerebrovascular accident)    x 2 no residulal   Hyperlipidemia    Hypertension    Obesity    Renal lesion    S/P TAVR (transcatheter aortic valve replacement) 11/13/2018   s/p TAVR with a 23mm Edwards S3U via the TF approach by Dr. Cornelius Moras and Dr. Clifton James   Severe aortic stenosis     PAST SURGICAL HISTORY: Past Surgical History:  Procedure Laterality Date   ABDOMINAL HYSTERECTOMY     AV FISTULA PLACEMENT Left 12/18/2017   Procedure: ARTERIOVENOUS (AV) FISTULA CREATION ARM;  Surgeon: Cephus Shelling, MD;  Location: [facility] OR;  Service: Vascular;  Laterality: Left;   bilateral cataract surgery     CARDIAC CATHETERIZATION  01/07/2014   DR Jacinto Halim   COLON RESECTION  02/1999   COLONOSCOPY     COLOSTOMY  02/1999   COLOSTOMY CLOSURE  07/1999   EYE SURGERY Left 2019   FISTULA SUPERFICIALIZATION Left 06/29/2018   Procedure: FISTULA SUPERFICIALIZATION LEFT BRACHIOCEPHALIC;  Surgeon: Cephus Shelling, MD;  Location: [facility] OR;  Service: Vascular;  Laterality: Left;   LEFT HEART CATHETERIZATION WITH CORONARY ANGIOGRAM N/A 01/07/2014   Procedure: LEFT HEART CATHETERIZATION WITH CORONARY ANGIOGRAM;  Surgeon: Pamella Pert, MD;  Location: [facility] CATH LAB;  Service: Cardiovascular;  Laterality: N/A;   LOOP RECORDER INSERTION N/A 02/17/2020   Procedure: LOOP RECORDER INSERTION;  Surgeon: Duke Salvia, MD;  Location: [facility] INVASIVE CV LAB;  Service: Cardiovascular;  Laterality: N/A;   middle cerebral artery stent placement Right    OTHER SURGICAL HISTORY     laser surgery   PTCA  01/07/2014   DES to RCA    DR Jacinto Halim   right knee surgery Right    for infection   RIGHT/LEFT HEART CATH AND CORONARY ANGIOGRAPHY N/A 10/23/2018   Procedure:  RIGHT/LEFT HEART CATH AND CORONARY ANGIOGRAPHY;  Surgeon: Elder Negus, MD;  Location: [facility] INVASIVE CV LAB;  Service: Cardiovascular;  Laterality: N/A;   TEE WITHOUT CARDIOVERSION N/A 11/13/2018   Procedure: TRANSESOPHAGEAL ECHOCARDIOGRAM (TEE);  Surgeon: Kathleene Hazel, MD;  Location: [facility] OR;  Service: Open Heart Surgery;  Laterality: N/A;   TRANSCATHETER AORTIC VALVE REPLACEMENT, TRANSFEMORAL  11/13/2018   TRANSCATHETER AORTIC VALVE REPLACEMENT, TRANSFEMORAL N/A 11/13/2018   Procedure: TRANSCATHETER AORTIC VALVE REPLACEMENT, TRANSFEMORAL;  Surgeon: Kathleene Hazel, MD;  Location: [facility] OR;  Service: Open Heart Surgery;  Laterality: N/A;    FAMILY  HISTORY: Family History  Problem Relation Age of Onset   Diabetes Mother    Heart disease Mother    Prostate cancer Father    Hypertension Brother    Prostate cancer Brother     SOCIAL HISTORY: Social History   Socioeconomic History   Marital status: Widowed    Spouse name: Not on file   Number of children: 2   Years of education: PHD   Highest education level: Not on file  Occupational History   Occupation: Retired PhD Teacher English  Tobacco Use   Smoking status: Never   Smokeless tobacco: Never  Vaping Use   Vaping status: Never Used  Substance and Sexual Activity   Alcohol use: No   Drug use: No   Sexual activity: Not on file  Other Topics Concern   Not on file  Social History Narrative   Lives alone, has brother close by   Patient is right-handed.   Caffeine use: 2-3 cups every day   Social Drivers of Health   Financial Resource Strain: Not on file  Food Insecurity: No Food Insecurity (12/13/2022)   Hunger Vital Sign    Worried About Running Out of Food in the Last Year: Never true    Ran Out of Food in the Last Year: Never true  Transportation Needs: No Transportation Needs (12/13/2022)   PRAPARE - Administrator, Civil Service (Medical): No    Lack of Transportation  (Non-Medical): No  Physical Activity: Not on file  Stress: Not on file  Social Connections: Not on file  Intimate Partner Violence: Not At Risk (12/13/2022)   Humiliation, Afraid, Rape, and Kick questionnaire    Fear of Current or Ex-Partner: No    Emotionally Abused: No    Physically Abused: No    Sexually Abused: No     PHYSICAL EXAM  GENERAL EXAM/CONSTITUTIONAL: Vitals:  Vitals:   03/21/23 1047  BP: 126/64  Pulse: 91  Weight: 124 lb (56.2 kg)  Height: 5' 2.5" (1.588 m)   Body mass index is 22.32 kg/m. Wt Readings from Last 3 Encounters:  03/21/23 124 lb (56.2 kg)  12/12/22 159 lb (72.1 kg)  04/10/22 125 lb (56.7 kg)   Patient is in no distress; well developed, nourished and groomed; neck is supple  CARDIOVASCULAR: Examination of carotid arteries is normal; no carotid bruits Regular rate and rhythm, HOLOSYSTOLIC HARSH MURMUR Examination of peripheral vascular system by observation and palpation is normal  EYES: Ophthalmoscopic exam of optic discs and posterior segments is normal; no papilledema or hemorrhages No results found.  MUSCULOSKELETAL: Gait, strength, tone, movements noted in Neurologic exam below  NEUROLOGIC: MENTAL STATUS:      No data to display         awake, alert, oriented to person, place and time recent and remote memory intact normal attention and concentration language fluent, comprehension intact, naming intact fund of knowledge appropriate  CRANIAL NERVE:  2nd - no papilledema on fundoscopic exam 2nd, 3rd, 4th, 6th - RIGHT PUPIL NR; LEFT PUPIL IRREG POST-SURGICAL, visual fields full to confrontation, extraocular muscles intact, no nystagmus 5th - facial sensation symmetric 7th - facial strength symmetric 8th - hearing intact 9th - palate elevates symmetrically, uvula midline 11th - shoulder shrug symmetric 12th - tongue protrusion midline  MOTOR:  normal bulk and tone, full strength in the BUE, BLE HAD SEVERAL EPISODES  ON LEFT ARM MYOCLONUS LASTING 20-30 SECONDS  SENSORY:  normal and symmetric to light touch  COORDINATION:  Forcier-nose-Pua, fine Roszak movements normal  REFLEXES:  deep tendon reflexes present and symmetric  GAIT/STATION:  narrow based gait; USING WALKER     DIAGNOSTIC DATA (LABS, IMAGING, TESTING) - I reviewed patient records, labs, notes, testing and imaging myself where available.  Lab Results  Component Value Date   WBC 15.9 (H) 02/16/2023   HGB 11.9 (L) 02/16/2023   HCT 35.0 (L) 02/16/2023   MCV 90.5 02/16/2023   PLT 219 02/16/2023      Component Value Date/Time   NA 134 (L) 02/16/2023 1802   NA 138 11/21/2018 1408   K 3.9 02/16/2023 1802   CL 94 (L) 02/16/2023 1802   CO2 25 02/16/2023 1750   GLUCOSE 188 (H) 02/16/2023 1802   BUN 46 (H) 02/16/2023 1802   BUN 39 (H) 11/21/2018 1408   CREATININE 4.80 (H) 02/16/2023 1802   CALCIUM 8.7 (L) 02/16/2023 1750   PROT 6.1 (L) 02/16/2023 1750   ALBUMIN 2.8 (L) 02/16/2023 1750   AST 38 02/16/2023 1750   ALT 33 02/16/2023 1750   ALKPHOS 55 02/16/2023 1750   BILITOT 0.7 02/16/2023 1750   GFRNONAA 9 (L) 02/16/2023 1750   GFRAA 20 (L) 11/21/2018 1408   Lab Results  Component Value Date   CHOL 171 11/09/2020   HDL 60 11/09/2020   LDLCALC 91 11/09/2020   TRIG 113 11/09/2020   CHOLHDL 2.9 11/09/2020   Lab Results  Component Value Date   HGBA1C 9.6 (H) 12/14/2022   Lab Results  Component Value Date   VITAMINB12 481 09/07/2021   Lab Results  Component Value Date   TSH 2.625 09/07/2021    12/13/22 EEG ABNORMALITY - Continuous slow, right posterior temporal region   IMPRESSION: This study is suggestive of cortical dysfunction arising from right posterior temporal region likely secondary to underlying stroke. No seizures or epileptiform discharges were seen throughout the recording.   02/16/23 MRI brain [I reviewed images myself and agree with interpretation. -VRP]  1. Motion limited study without  evidence of acute intracranial abnormality. 2. Remote right frontal and right occipital infarcts.    ASSESSMENT AND PLAN  83 y.o. year old female here with:   Dx:  1. Partial symptomatic epilepsy with simple partial seizures, not intractable, without status epilepticus (HCC)     PLAN:  PARTIAL SEIZURES (LEFT ARM AND LEFT LEG CONVULSIONS / MYOCLONUS; since ~ Oct 2024, post-stroke 2022) - continue levetiracetam 500mg  twice a day + extra 500mg  daily after dialysis treatments - add vimpat 50mg  twice a day; may increase further pending response - may consider gabapentin or lamotrigine in future  Meds ordered this encounter  Medications   lacosamide (VIMPAT) 50 MG TABS tablet    Sig: Take 1 tablet (50 mg total) by mouth 2 (two) times daily.    Dispense:  60 tablet    Refill:  5   Return in about 3 months (around 06/21/2023) for MyChart visit (15 min).    Suanne Marker, MD 03/21/2023, 11:27 AM Certified in Neurology, Neurophysiology and Neuroimaging  [facility] [address] [place], [place] [address] [phone]

## 2023-04-19 ENCOUNTER — Telehealth: Payer: Self-pay | Admitting: Diagnostic Neuroimaging

## 2023-04-19 MED ORDER — LACOSAMIDE 100 MG PO TABS: 100.0000 mg | ORAL_TABLET | Freq: Two times a day (BID) | ORAL | 5 refills | Status: DC

## 2023-04-19 NOTE — Telephone Encounter (Signed)
 I called patient's daughter, Yehuda Savannah, per DPR.  She reports that since starting lacosamide 50 twice daily, patient's seizures have not decreased.  She confirms that patient is also taking levetiracetam 500 mg twice daily with an additional 500 mg on dialysis days.  She reports that patient has tolerated lacosamide well.  She is still having daily seizure several times a day.  She is wondering if lacosamide could be increased.

## 2023-04-19 NOTE — Telephone Encounter (Signed)
 May increase lacosamide to 100mg  twice a day.  Meds ordered this encounter  Medications   lacosamide 100 MG TABS    Sig: Take 1 tablet (100 mg total) by mouth 2 (two) times daily.    Dispense:  60 tablet    Refill:  5     Suanne Marker, MD 04/19/2023, 4:16 PM Certified in Neurology, Neurophysiology and Neuroimaging  [facility] [address], [address] [place], [place] [address] [phone]

## 2023-04-19 NOTE — Telephone Encounter (Signed)
 I called patient. I spoke with Yehuda Savannah, patient's daughter, per DPR. I advised her that the lacosamide has been increased to 100mg  BID and sent to [facility]. Patient will take two of the lacosamide 50mg  BID until she runs out, and then will pick up the 100mg  BID RX. Patient's daughter will let us know of any further questions or concerns.

## 2023-04-19 NOTE — Telephone Encounter (Signed)
 Pt's daughter called stating that the pt is still having seizures even after starting the Lacosamide 50mg . Please advise.

## 2023-05-18 ENCOUNTER — Telehealth: Payer: Self-pay | Admitting: Diagnostic Neuroimaging

## 2023-05-18 NOTE — Telephone Encounter (Signed)
 Pt's Homeaide, Venika reporting to neurologist has decrease the seizures, but experience severe weakness on the left side for couple of weeks.

## 2023-05-22 NOTE — Telephone Encounter (Signed)
 Spoke w/Pt regarding reported left sided weakness. Pt states she is still experiencing this in her arm, hand, and leg. Pt denied facial dropping and was speaking clearly. Pt stated she is still able to walk with her walker and can pick up objects with her left had but has difficulty holding onto the objects, however does not drop the object. Pt stated she is still having seizures and had one yesterday while at church. Pt stated it did not last long. Stated she feels pretty good this morning except for the weakness. Pt is aware of upcoming visit in June. Pt voiced thankful for call back.

## 2023-05-31 NOTE — Telephone Encounter (Signed)
 Called the pt and there was no answer. LVM advising that MD was made aware of her concerns and that at this time Dr Salli Crawley didn't have anything in addition to add. We will plan to see at scheduled apt in June. Advised if she has questions or concerns to call back.

## 2023-06-27 ENCOUNTER — Telehealth: Admitting: Diagnostic Neuroimaging

## 2023-10-10 ENCOUNTER — Emergency Department (HOSPITAL_BASED_OUTPATIENT_CLINIC_OR_DEPARTMENT_OTHER)
Admission: EM | Admit: 2023-10-10 | Discharge: 2023-10-10 | Disposition: A | Discharge status: Home / Self Care | Source: Ambulatory Visit | Attending: Emergency Medicine | Admitting: Emergency Medicine

## 2023-10-10 ENCOUNTER — Emergency Department (HOSPITAL_BASED_OUTPATIENT_CLINIC_OR_DEPARTMENT_OTHER)

## 2023-10-10 ENCOUNTER — Emergency Department (HOSPITAL_COMMUNITY)

## 2023-10-10 ENCOUNTER — Other Ambulatory Visit: Payer: Self-pay

## 2023-10-10 DIAGNOSIS — Z7982 Long term (current) use of aspirin: Secondary | ICD-10-CM | POA: Insufficient documentation

## 2023-10-10 DIAGNOSIS — Z992 Dependence on renal dialysis: Secondary | ICD-10-CM | POA: Insufficient documentation

## 2023-10-10 DIAGNOSIS — R42 Dizziness and giddiness: Secondary | ICD-10-CM | POA: Diagnosis present

## 2023-10-10 DIAGNOSIS — Z794 Long term (current) use of insulin: Secondary | ICD-10-CM | POA: Diagnosis not present

## 2023-10-10 DIAGNOSIS — R531 Weakness: Secondary | ICD-10-CM | POA: Diagnosis not present

## 2023-10-10 LAB — RESP PANEL BY RT-PCR (RSV, FLU A&B, COVID)  RVPGX2
Influenza A by PCR: NEGATIVE
Influenza B by PCR: NEGATIVE
Resp Syncytial Virus by PCR: NEGATIVE
SARS Coronavirus 2 by RT PCR: NEGATIVE

## 2023-10-10 LAB — URINALYSIS, ROUTINE W REFLEX MICROSCOPIC
Bacteria, UA: NONE SEEN
Bilirubin Urine: NEGATIVE
Glucose, UA: NEGATIVE mg/dL
Hgb urine dipstick: NEGATIVE
Ketones, ur: NEGATIVE mg/dL
Nitrite: NEGATIVE
Protein, ur: 100 mg/dL — AB
Specific Gravity, Urine: 1.017 (ref 1.005–1.030)
pH: 7.5 (ref 5.0–8.0)

## 2023-10-10 LAB — BASIC METABOLIC PANEL WITH GFR
Anion gap: 14 (ref 5–15)
BUN: 30 mg/dL — ABNORMAL HIGH (ref 8–23)
CO2: 29 mmol/L (ref 22–32)
Calcium: 9.6 mg/dL (ref 8.9–10.3)
Chloride: 93 mmol/L — ABNORMAL LOW (ref 98–111)
Creatinine, Ser: 4.47 mg/dL — ABNORMAL HIGH (ref 0.44–1.00)
GFR, Estimated: 9 mL/min — ABNORMAL LOW (ref >60)
Glucose, Bld: 175 mg/dL — ABNORMAL HIGH (ref 70–99)
Potassium: 4.3 mmol/L (ref 3.5–5.1)
Sodium: 136 mmol/L (ref 135–145)

## 2023-10-10 LAB — CBC WITH DIFFERENTIAL/PLATELET
Abs Immature Granulocytes: 0.02 K/uL (ref 0.00–0.07)
Basophils Absolute: 0 K/uL (ref 0.0–0.1)
Basophils Relative: 1 %
Eosinophils Absolute: 0.2 K/uL (ref 0.0–0.5)
Eosinophils Relative: 3 %
HCT: 32.6 % — ABNORMAL LOW (ref 36.0–46.0)
Hemoglobin: 10.7 g/dL — ABNORMAL LOW (ref 12.0–15.0)
Immature Granulocytes: 0 %
Lymphocytes Relative: 28 %
Lymphs Abs: 1.8 K/uL (ref 0.7–4.0)
MCH: 31.7 pg (ref 26.0–34.0)
MCHC: 32.8 g/dL (ref 30.0–36.0)
MCV: 96.4 fL (ref 80.0–100.0)
Monocytes Absolute: 0.6 K/uL (ref 0.1–1.0)
Monocytes Relative: 10 %
Neutro Abs: 3.8 K/uL (ref 1.7–7.7)
Neutrophils Relative %: 58 %
Platelets: 214 K/uL (ref 150–400)
RBC: 3.38 MIL/uL — ABNORMAL LOW (ref 3.87–5.11)
RDW: 14.2 % (ref 11.5–15.5)
WBC: 6.5 K/uL (ref 4.0–10.5)
nRBC: 0 % (ref 0.0–0.2)

## 2023-10-10 NOTE — ED Provider Notes (Signed)
 [place] EMERGENCY DEPARTMENT AT [facility] Provider Note   CSN: [id number] Arrival date & time: 10/10/23  1200     Patient presents with: Weakness   [name] is a 83 y.o. female.  83 year old female presents to the ED with complaints of general weakness and fatigue.  Patient has significant history of dialysis with scheduled Monday Wednesday Friday.  Patient reports yesterday she got home and was started to bed after her dialysis.  She woke up this morning with weakness and mild dizziness.  Patient has been able to walk to the bathroom with walker.  Patient reports she is usually pretty weak after dialysis but has never been this week prior.  Patient denies any other complaints including abdominal pain, nausea, vomiting, diarrhea, chest pain, shortness of breath, headache.     Prior to Admission medications   Medication Sig Start Date End Date Taking? Authorizing Provider  aspirin  EC 81 MG tablet Take 81 mg by mouth daily.    [provider]  folic acid  (FOLVITE ) 1 MG tablet Take 1 mg by mouth daily.    [provider]  HUMALOG KWIKPEN 100 UNIT/ML KwikPen Inject 1-5 Units into the skin See admin instructions. Inject 1-5 units 3 times daily with meals and at bedtime. Dose depends on what pt is eating. 05/28/18   [provider]  hydrALAZINE  (APRESOLINE ) 50 MG tablet Take 1 tablet (50 mg total) by mouth 3 (three) times daily. 12/14/22   Dezii, Alexandra, DO  Insulin  Degludec (TRESIBA ) 100 UNIT/ML SOLN Inject 10 Units into the skin at bedtime. Patient taking differently: Inject 8 Units into the skin at bedtime. 01/24/22   Drusilla Sabas RAMAN, MD  isosorbide  mononitrate (IMDUR ) 120 MG 24 hr tablet TAKE 2 TABLETS BY MOUTH DAILY 03/04/22   Patwardhan, Newman PARAS, MD  lacosamide  100 MG TABS Take 1 tablet (100 mg total) by mouth 2 (two) times daily. 04/19/23   Penumalli, Vikram R, MD  levETIRAcetam  (KEPPRA ) 500 MG tablet Take 500 mg by mouth 2 (two) times daily. Mon  wed fri take additional 500 mg after dialysis    [provider]  pantoprazole  (PROTONIX ) 40 MG tablet Take 1 tablet (40 mg total) by mouth daily at 12 noon. 03/02/22   Drusilla Sabas RAMAN, MD  venlafaxine  (EFFEXOR ) 75 MG tablet Take 75 mg by mouth daily. Takes in additional to the 150 mg capsule    [provider]  venlafaxine  XR (EFFEXOR -XR) 150 MG 24 hr capsule Take 1 capsule (150 mg total) by mouth daily. Patient taking differently: Take 150 mg by mouth daily. Pt takes this in addition to the 75 mg capsule 02/27/20   Love, Pamela S, PA-C    Allergies: Patient has no known allergies.    Review of Systems  Constitutional:  Positive for fatigue.  Neurological:  Positive for dizziness and weakness.  All other systems reviewed and are negative.   Updated Vital Signs BP (!) 141/57   Pulse (!) 56   Temp 98.5 F (36.9 C) (Oral)   Resp 18   SpO2 100%   Physical Exam Vitals and nursing note reviewed.  Constitutional:      Appearance: Normal appearance.  HENT:     Head: Normocephalic and atraumatic.     Nose: Nose normal.  Eyes:     Extraocular Movements: Extraocular movements intact.     Conjunctiva/sclera: Conjunctivae normal.     Pupils: Pupils are equal, round, and reactive to light.  Cardiovascular:  Rate and Rhythm: Normal rate.  Pulmonary:     Effort: Pulmonary effort is normal. No respiratory distress.  Abdominal:     General: Abdomen is flat.     Palpations: Abdomen is soft.  Musculoskeletal:        General: Normal range of motion.     Cervical back: Normal range of motion.  Skin:    General: Skin is warm.     Capillary Refill: Capillary refill takes less than 2 seconds.  Neurological:     General: No focal deficit present.     Mental Status: She is alert and oriented to person, place, and time.     Cranial Nerves: No cranial nerve deficit.     Motor: Weakness present.     Gait: Gait normal.  Psychiatric:        Mood and Affect: Mood normal.         Behavior: Behavior normal.     (all labs ordered are listed, but only abnormal results are displayed) Labs Reviewed  BASIC METABOLIC PANEL WITH GFR - Abnormal; Notable for the following components:      Result Value   Chloride 93 (*)    Glucose, Bld 175 (*)    BUN 30 (*)    Creatinine, Ser 4.47 (*)    GFR, Estimated 9 (*)    All other components within normal limits  CBC WITH DIFFERENTIAL/PLATELET - Abnormal; Notable for the following components:   RBC 3.38 (*)    Hemoglobin 10.7 (*)    HCT 32.6 (*)    All other components within normal limits  URINALYSIS, ROUTINE W REFLEX MICROSCOPIC - Abnormal; Notable for the following components:   Protein, ur 100 (*)    Leukocytes,Ua MODERATE (*)    All other components within normal limits  RESP PANEL BY RT-PCR (RSV, FLU A&B, COVID)  [id number]    EKG: EKG Interpretation Date/Time:  Tuesday October 10 2023 12:18:13 EDT Ventricular Rate:  63 PR Interval:  195 QRS Duration:  110 QT Interval:  449 QTC Calculation: 460 R Axis:   30  Text Interpretation: Unknown rhythm, irregular rate Low voltage, precordial leads No acute changes atrial arrhythmia suspected Confirmed by Charlyn Sora [phone]) on 10/10/2023 3:12:37 PM  Radiology: No results found.   Procedures   Medications Ordered in the ED - No data to display  83 y.o. female presents to the ED with complaints of generalized weakness, this involves an extensive number of treatment options, and is a complaint that carries with it a high risk of complications and morbidity.  The differential diagnosis includes ACS, TIA, CVA, dehydration, electrolyte abnormality, anemia, (Ddx)  On arrival pt is nontoxic, vitals mildly hypertensive otherwise unremarkable. Exam unremarkable for any acute pain or obvious abnormalities.   Lab Tests:  I Ordered, reviewed, and interpreted labs, which included: BMP and CBC  Imaging Studies ordered:  I ordered imaging studies which included CT head  without contrast, I independently visualized and interpreted imaging which showed old right frontal and occipital infarcts atrophy and chronic microvascular disease, no acute abnormality.  ED Course:   83 year old female presents to ED with complaints of generalized weakness following dialysis yesterday.  Patient reports she is generally fatigued after dialysis but yesterday and this morning it was worse than normal.  She has found it hard to move around like her normal.  She has been able to get up with her walker and go to the bathroom without incident.  Patient has no complaints and denies  any chest pain, shortness of breath, headache, and dizziness.  Initial plan is to rule out electrolyte abnormality.  BMP and CBC were at baseline compared to other lab work.  Rule out UTI and viral illness.  Patient does not have any obvious neurodeficits on exam.  Patient was able to ambulate around room at baseline.  Patient reports some dizziness upon ambulation and general weakness.  Patient reports she has a feeling of something being stuck in her throat but is still able to eat and drink without difficulty.  Patient does not have any dysphagia or aphasia.  After discussion with attending it is advised to do CT without contrast and MRI.  CT will be done and patient will be transported POV for MRI at [facility].  Patient's family is with her and will be driving her.  Patient has home health care and family lives with her as well.  Dr.Yao will be accepting attending.   Portions of this note were generated with Scientist, clinical (histocompatibility and immunogenetics). Dictation errors may occur despite best attempts at proofreading.   Final diagnoses:  Weakness  Dizziness    ED Discharge Orders     None          Myriam Fonda GORMAN DEVONNA 10/10/23 1856    Geraldene Hamilton, MD 10/10/23 2219

## 2023-10-10 NOTE — ED Triage Notes (Signed)
 C/o increased weakness and dizziness since last dialysis appt on Wednesday. Goes MWF  States feels like something stuck in throat

## 2023-10-10 NOTE — Discharge Instructions (Addendum)
 Return for any new or worse symptoms.  MRI without any acute findings.  Make an appointment follow-up with your doctor.

## 2023-10-10 NOTE — ED Notes (Signed)
 MRI contacted and aware pt is in lobby.

## 2023-10-10 NOTE — ED Notes (Signed)
 Patient going POV to [facility] for an MRI,  Dr. Patt accepting

## 2023-10-10 NOTE — ED Notes (Signed)
 Pt aware of the need for a urine... Pt currently unable to provide a sample.SABRASABRASABRA

## 2023-10-10 NOTE — ED Notes (Signed)
 Pt cleared to be transported POV/Family to [facility] ED by Provider... Pt AO x4, no new symptoms upon leaving (slight dizziness/additional weakness left side)... V/S as noted... Family understood where to go/how to get to [facility] and who to talk to.SABRASABRA

## 2023-10-10 NOTE — ED Notes (Signed)
 Report given to [facility] ED Charge.Aaron AasAaron Aas

## 2023-11-10 ENCOUNTER — Encounter (HOSPITAL_COMMUNITY): Payer: Self-pay

## 2023-11-10 ENCOUNTER — Emergency Department (HOSPITAL_COMMUNITY)

## 2023-11-10 ENCOUNTER — Observation Stay (HOSPITAL_COMMUNITY)
Admission: EM | Admit: 2023-11-10 | Discharge: 2023-11-12 | Disposition: A | Discharge status: Home Health | Attending: Family Medicine | Admitting: Family Medicine

## 2023-11-10 ENCOUNTER — Other Ambulatory Visit: Payer: Self-pay

## 2023-11-10 DIAGNOSIS — I959 Hypotension, unspecified: Principal | ICD-10-CM | POA: Diagnosis present

## 2023-11-10 DIAGNOSIS — E876 Hypokalemia: Secondary | ICD-10-CM | POA: Diagnosis present

## 2023-11-10 DIAGNOSIS — G8194 Hemiplegia, unspecified affecting left nondominant side: Secondary | ICD-10-CM | POA: Insufficient documentation

## 2023-11-10 DIAGNOSIS — Z794 Long term (current) use of insulin: Secondary | ICD-10-CM | POA: Diagnosis not present

## 2023-11-10 DIAGNOSIS — E1122 Type 2 diabetes mellitus with diabetic chronic kidney disease: Secondary | ICD-10-CM | POA: Diagnosis not present

## 2023-11-10 DIAGNOSIS — Z959 Presence of cardiac and vascular implant and graft, unspecified: Secondary | ICD-10-CM | POA: Diagnosis not present

## 2023-11-10 DIAGNOSIS — I2510 Atherosclerotic heart disease of native coronary artery without angina pectoris: Secondary | ICD-10-CM | POA: Diagnosis present

## 2023-11-10 DIAGNOSIS — Z79899 Other long term (current) drug therapy: Secondary | ICD-10-CM | POA: Diagnosis not present

## 2023-11-10 DIAGNOSIS — R569 Unspecified convulsions: Secondary | ICD-10-CM

## 2023-11-10 DIAGNOSIS — I120 Hypertensive chronic kidney disease with stage 5 chronic kidney disease or end stage renal disease: Secondary | ICD-10-CM | POA: Diagnosis not present

## 2023-11-10 DIAGNOSIS — Z992 Dependence on renal dialysis: Secondary | ICD-10-CM | POA: Diagnosis not present

## 2023-11-10 DIAGNOSIS — G40209 Localization-related (focal) (partial) symptomatic epilepsy and epileptic syndromes with complex partial seizures, not intractable, without status epilepticus: Secondary | ICD-10-CM | POA: Insufficient documentation

## 2023-11-10 DIAGNOSIS — N186 End stage renal disease: Secondary | ICD-10-CM | POA: Insufficient documentation

## 2023-11-10 DIAGNOSIS — Z952 Presence of prosthetic heart valve: Secondary | ICD-10-CM

## 2023-11-10 DIAGNOSIS — E440 Moderate protein-calorie malnutrition: Secondary | ICD-10-CM | POA: Diagnosis present

## 2023-11-10 DIAGNOSIS — E1129 Type 2 diabetes mellitus with other diabetic kidney complication: Secondary | ICD-10-CM | POA: Diagnosis present

## 2023-11-10 DIAGNOSIS — I10 Essential (primary) hypertension: Secondary | ICD-10-CM | POA: Diagnosis present

## 2023-11-10 LAB — CBC WITH DIFFERENTIAL/PLATELET
Abs Immature Granulocytes: 0.09 K/uL — ABNORMAL HIGH (ref 0.00–0.07)
Basophils Absolute: 0.1 K/uL (ref 0.0–0.1)
Basophils Relative: 1 %
Eosinophils Absolute: 0.1 K/uL (ref 0.0–0.5)
Eosinophils Relative: 1 %
HCT: 36.3 % (ref 36.0–46.0)
Hemoglobin: 12.1 g/dL (ref 12.0–15.0)
Immature Granulocytes: 1 %
Lymphocytes Relative: 14 %
Lymphs Abs: 2 K/uL (ref 0.7–4.0)
MCH: 32.1 pg (ref 26.0–34.0)
MCHC: 33.3 g/dL (ref 30.0–36.0)
MCV: 96.3 fL (ref 80.0–100.0)
Monocytes Absolute: 0.9 K/uL (ref 0.1–1.0)
Monocytes Relative: 6 %
Neutro Abs: 11.4 K/uL — ABNORMAL HIGH (ref 1.7–7.7)
Neutrophils Relative %: 77 %
Platelets: 163 K/uL (ref 150–400)
RBC: 3.77 MIL/uL — ABNORMAL LOW (ref 3.87–5.11)
RDW: 14.1 % (ref 11.5–15.5)
WBC: 14.6 K/uL — ABNORMAL HIGH (ref 4.0–10.5)
nRBC: 0 % (ref 0.0–0.2)

## 2023-11-10 LAB — BASIC METABOLIC PANEL WITH GFR
Anion gap: 17 — ABNORMAL HIGH (ref 5–15)
BUN: 24 mg/dL — ABNORMAL HIGH (ref 8–23)
CO2: 28 mmol/L (ref 22–32)
Calcium: 8.2 mg/dL — ABNORMAL LOW (ref 8.9–10.3)
Chloride: 92 mmol/L — ABNORMAL LOW (ref 98–111)
Creatinine, Ser: 3.04 mg/dL — ABNORMAL HIGH (ref 0.44–1.00)
GFR, Estimated: 15 mL/min — ABNORMAL LOW (ref >60)
Glucose, Bld: 266 mg/dL — ABNORMAL HIGH (ref 70–99)
Potassium: 3.3 mmol/L — ABNORMAL LOW (ref 3.5–5.1)
Sodium: 137 mmol/L (ref 135–145)

## 2023-11-10 LAB — I-STAT CG4 LACTIC ACID, ED: Lactic Acid, Venous: 3 mmol/L (ref 0.5–1.9)

## 2023-11-10 LAB — GLUCOSE, CAPILLARY: Glucose-Capillary: 334 mg/dL — ABNORMAL HIGH (ref 70–99)

## 2023-11-10 MED ORDER — SODIUM CHLORIDE 0.9 % IV BOLUS
500.0000 mL | Freq: Once | INTRAVENOUS | Status: AC
  Administered 2023-11-10: 500 mL via INTRAVENOUS

## 2023-11-10 MED ORDER — SODIUM CHLORIDE 0.9 % IV SOLN
2.0000 g | Freq: Once | INTRAVENOUS | Status: AC
  Administered 2023-11-10: 2 g via INTRAVENOUS
  Filled 2023-11-10: qty 12.5

## 2023-11-10 MED ORDER — LACOSAMIDE 50 MG PO TABS
100.0000 mg | ORAL_TABLET | Freq: Two times a day (BID) | ORAL | Status: DC
  Administered 2023-11-11 – 2023-11-12 (×4): 100 mg via ORAL
  Filled 2023-11-10 (×4): qty 2

## 2023-11-10 MED ORDER — ONDANSETRON HCL 4 MG PO TABS: 4.0000 mg | ORAL_TABLET | Freq: Four times a day (QID) | ORAL | Status: DC | PRN

## 2023-11-10 MED ORDER — LEVETIRACETAM 500 MG PO TABS: 500.0000 mg | ORAL_TABLET | ORAL | Status: DC

## 2023-11-10 MED ORDER — VANCOMYCIN HCL IN DEXTROSE 1-5 GM/200ML-% IV SOLN
1000.0000 mg | Freq: Once | INTRAVENOUS | Status: AC
  Administered 2023-11-10: 1000 mg via INTRAVENOUS
  Filled 2023-11-10: qty 200

## 2023-11-10 MED ORDER — VENLAFAXINE HCL ER 75 MG PO CP24
225.0000 mg | ORAL_CAPSULE | Freq: Every day | ORAL | Status: DC
  Administered 2023-11-11 – 2023-11-12 (×2): 225 mg via ORAL
  Filled 2023-11-10 (×2): qty 3

## 2023-11-10 MED ORDER — LEVETIRACETAM 250 MG PO TABS
250.0000 mg | ORAL_TABLET | Freq: Once | ORAL | Status: AC
  Administered 2023-11-10: 250 mg via ORAL
  Filled 2023-11-10: qty 1

## 2023-11-10 MED ORDER — LEVETIRACETAM 500 MG PO TABS: 500.0000 mg | ORAL_TABLET | Freq: Two times a day (BID) | ORAL | Status: DC

## 2023-11-10 MED ORDER — METRONIDAZOLE 500 MG/100ML IV SOLN
500.0000 mg | Freq: Once | INTRAVENOUS | Status: AC
  Administered 2023-11-10: 500 mg via INTRAVENOUS
  Filled 2023-11-10: qty 100

## 2023-11-10 MED ORDER — ACETAMINOPHEN 325 MG PO TABS: 650.0000 mg | ORAL_TABLET | Freq: Four times a day (QID) | ORAL | Status: DC | PRN

## 2023-11-10 MED ORDER — INSULIN GLARGINE 100 UNIT/ML ~~LOC~~ SOLN
8.0000 [IU] | Freq: Every day | SUBCUTANEOUS | Status: DC
  Administered 2023-11-11 (×2): 8 [IU] via SUBCUTANEOUS
  Filled 2023-11-10 (×3): qty 0.08

## 2023-11-10 MED ORDER — HEPARIN SODIUM (PORCINE) 5000 UNIT/ML IJ SOLN
5000.0000 [IU] | Freq: Three times a day (TID) | INTRAMUSCULAR | Status: DC
  Administered 2023-11-11 – 2023-11-12 (×4): 5000 [IU] via SUBCUTANEOUS
  Filled 2023-11-10 (×4): qty 1

## 2023-11-10 MED ORDER — ACETAMINOPHEN 650 MG RE SUPP: 650.0000 mg | Freq: Four times a day (QID) | RECTAL | Status: DC | PRN

## 2023-11-10 MED ORDER — INSULIN ASPART 100 UNIT/ML IJ SOLN
0.0000 [IU] | Freq: Three times a day (TID) | INTRAMUSCULAR | Status: DC
  Administered 2023-11-11: 1 [IU] via SUBCUTANEOUS
  Administered 2023-11-11: 2 [IU] via SUBCUTANEOUS
  Administered 2023-11-11: 1 [IU] via SUBCUTANEOUS

## 2023-11-10 MED ORDER — ONDANSETRON HCL 4 MG/2ML IJ SOLN: 4.0000 mg | Freq: Four times a day (QID) | INTRAMUSCULAR | Status: DC | PRN

## 2023-11-10 MED ORDER — LEVETIRACETAM 500 MG PO TABS
500.0000 mg | ORAL_TABLET | Freq: Two times a day (BID) | ORAL | Status: DC
Start: 2023-11-11 — End: 2023-11-12
  Administered 2023-11-11 – 2023-11-12 (×4): 500 mg via ORAL
  Filled 2023-11-10 (×4): qty 1

## 2023-11-10 MED ORDER — ASPIRIN 81 MG PO TBEC
81.0000 mg | DELAYED_RELEASE_TABLET | Freq: Every day | ORAL | Status: DC
  Administered 2023-11-11 – 2023-11-12 (×2): 81 mg via ORAL
  Filled 2023-11-10 (×2): qty 1

## 2023-11-10 MED ORDER — PANTOPRAZOLE SODIUM 40 MG PO TBEC
40.0000 mg | DELAYED_RELEASE_TABLET | Freq: Every day | ORAL | Status: DC
  Administered 2023-11-11 – 2023-11-12 (×2): 40 mg via ORAL
  Filled 2023-11-10 (×2): qty 1

## 2023-11-10 NOTE — ED Provider Notes (Signed)
 [place] EMERGENCY DEPARTMENT AT [facility] Provider Note   CSN: [id number] Arrival date & time: 11/10/23  1453     Patient presents with: Hypotension   [name] is a 83 y.o. female.   83 year old female presenting from dialysis for reported hypotension.  Patient reportedly had around 2.7 L pulled off during dialysis.  She had a brief episode of hypotension afterwards.  She denied any symptoms during that time.  The dialysis staff reportedly gave her some fluids but EMS was unsure how much she received.  Her blood pressure upon arrival to the ED has stabilized.  She has no current complaints and is resting comfortably. EMS noted that the staff mentioned a seizure during treatment.  This reportedly is normal for her and happens frequently.  Prior hospital stay for seizure-like activity noted that she should be on Keppra  with an additional dose after dialysis.          Prior to Admission medications   Medication Sig Start Date End Date Taking? Authorizing Provider  aspirin  EC 81 MG tablet Take 81 mg by mouth daily.    [provider]  folic acid  (FOLVITE ) 1 MG tablet Take 1 mg by mouth daily.    [provider]  HUMALOG KWIKPEN 100 UNIT/ML KwikPen Inject 1-5 Units into the skin See admin instructions. Inject 1-5 units 3 times daily with meals and at bedtime. Dose depends on what pt is eating. 05/28/18   [provider]  hydrALAZINE  (APRESOLINE ) 50 MG tablet Take 1 tablet (50 mg total) by mouth 3 (three) times daily. 12/14/22   Dezii, Alexandra, DO  Insulin  Degludec (TRESIBA ) 100 UNIT/ML SOLN Inject 10 Units into the skin at bedtime. Patient taking differently: Inject 8 Units into the skin at bedtime. 01/24/22   Drusilla Sabas RAMAN, MD  isosorbide  mononitrate (IMDUR ) 120 MG 24 hr tablet TAKE 2 TABLETS BY MOUTH DAILY 03/04/22   Patwardhan, Newman PARAS, MD  lacosamide  100 MG TABS Take 1 tablet (100 mg total) by mouth 2 (two) times daily. 04/19/23   Penumalli,  Vikram R, MD  levETIRAcetam  (KEPPRA ) 500 MG tablet Take 500 mg by mouth 2 (two) times daily. Mon wed fri take additional 500 mg after dialysis    [provider]  pantoprazole  (PROTONIX ) 40 MG tablet Take 1 tablet (40 mg total) by mouth daily at 12 noon. 03/02/22   Drusilla Sabas RAMAN, MD  venlafaxine  (EFFEXOR ) 75 MG tablet Take 75 mg by mouth daily. Takes in additional to the 150 mg capsule    [provider]  venlafaxine  XR (EFFEXOR -XR) 150 MG 24 hr capsule Take 1 capsule (150 mg total) by mouth daily. Patient taking differently: Take 150 mg by mouth daily. Pt takes this in addition to the 75 mg capsule 02/27/20   Love, Pamela S, PA-C    Allergies: Patient has no known allergies.    Review of Systems  All other systems reviewed and are negative.   Updated Vital Signs BP 111/63   Pulse 89   Temp 97.7 F (36.5 C) (Rectal)   Resp 18   Ht 5' 2.5 (1.588 m)   Wt 54.9 kg   SpO2 97%   BMI 21.78 kg/m   Physical Exam Vitals and nursing note reviewed.  Constitutional:      General: She is not in acute distress. HENT:     Head: Atraumatic.     Mouth/Throat:     Mouth: Mucous membranes are moist.  Eyes:  Conjunctiva/sclera: Conjunctivae normal.  Cardiovascular:     Rate and Rhythm: Normal rate.  Pulmonary:     Effort: No respiratory distress.  Abdominal:     Palpations: Abdomen is soft.  Musculoskeletal:        General: No signs of injury.     Cervical back: Neck supple.  Skin:    General: Skin is warm.  Neurological:     Mental Status: She is alert and oriented to person, place, and time.     Comments: Left-sided weakness     (all labs ordered are listed, but only abnormal results are displayed) Labs Reviewed  CBC WITH DIFFERENTIAL/PLATELET  BASIC METABOLIC PANEL WITH GFR    EKG: None  Radiology: No results found.   Procedures   Medications Ordered in the ED  levETIRAcetam  (KEPPRA ) tablet 250 mg (has no administration in time range)                                     Medical Decision Making 83 year old female brought to the emergency department for evaluation after she developed some transient hypotension after dialysis today.  It is unclear how much fluid she received but her vitals are stable here in the ED.  She is asymptomatic and resting comfortably.  We plan on checking her hemoglobin and electrolytes to rule out any other cause of her transient hypotension.  While we run these labs, we will continue to observe her blood pressure to ensure it stays within normal parameters.  Patient's neurologic exam is at baseline and she is answering questions appropriately. Hx of stroke with L sided deficits.  No seizure-like activity here.  Keppra  dose ordered Patient signed out to the evening provider for follow-up on her lab work   Amount and/or Complexity of Data Reviewed Labs: ordered.  Risk Prescription drug management.     Final diagnoses:  None    ED Discharge Orders     None          Dijuan Sleeth, DO 11/10/23 1526

## 2023-11-10 NOTE — Assessment & Plan Note (Signed)
-   Will need Nephrology consultation Monday if still present in hospital

## 2023-11-10 NOTE — Assessment & Plan Note (Signed)
 Generalized tonic clonic seizure Elevated lactic acid No focal symptoms of infection.  No cough, dyspea, sputum, urinary irritative symptoms.  No fever, malaise prior to admission.  Overall I suspect her WBC and lactic acidosis are more likely seizure related. - Hold antibiotics - Trend WBC, lactate - Trend fever curve - Follow UA and blood cultures

## 2023-11-10 NOTE — Hospital Course (Addendum)
 83 y.o. F with ESRD on HD MWF, DM, HTN, CAD, CVA w/ L weakness c/b partial seizures, AS s/p TAVR, and OSA not on CPAP who presented with hypotension from dialysis.  Had been feeling her normal self lately. Went to HD per routine.  In HD, she felt normal until she had a generalized seizure.  Description of this event is not possible, but patient reports she passed out, doesn't remember anything until staff were surrounding her.  She was told she had a seizure and EMS were called.  She was also reportedly noted to be very hypotensive to the 60s so HD staff gave some fluids.   In the ER, patient appeared better after fluids given PTA and vitals were normal at first.  After about 2 hours however, SBP dropped back to the 80s and her labs resulted with WBC 14K and lactate 3 so she was given broad spectrum antibiotics and hospitalists were asked to keep for observation.  CXR clear.  UA pending.  Blood cultures obtained.    Patient without focal complaints, just malaise and weakness during hypotension episodes, but now after fluids again feeling normal.  Grieving her brother who passed recently.

## 2023-11-10 NOTE — Assessment & Plan Note (Signed)
 Cerebrovascular disease On Repatha .  Has mild left sided weakness at baseline. - Continue aspirin , Lipitor - Hold Imdur  given hypotension

## 2023-11-10 NOTE — Sepsis Progress Note (Signed)
 Elink monitoring for the code sepsis protocol.

## 2023-11-10 NOTE — ED Notes (Signed)
 PT states her arm was itch provider notified and PT rate slowed on vancomycin  per MD.

## 2023-11-10 NOTE — H&P (Signed)
 History and Physical    Patient: [name] FMW:[id number] DOB: [birth year] DOA: 11/10/2023 DOS: the patient was seen and examined on 11/10/2023 PCP: Roanna Ezekiel NOVAK, MD  Patient coming from: Home  Chief Complaint:  Chief Complaint  Patient presents with   Hypotension       HPI:  83 y.o. F with ESRD on HD MWF, DM, HTN, CAD, CVA w/ L weakness c/b partial seizures, AS s/p TAVR, and OSA not on CPAP who presented with hypotension from dialysis.  Had been feeling her normal self lately. Went to HD per routine.  In HD, she felt normal until she had a generalized seizure.  Description of this event is not possible, but patient reports she passed out, doesn't remember anything until staff were surrounding her.  She was told she had a seizure and EMS were called.  She was also reportedly noted to be very hypotensive to the 60s so HD staff gave some fluids.   In the ER, patient appeared better after fluids given PTA and vitals were normal at first.  After about 2 hours however, SBP dropped back to the 80s and her labs resulted with WBC 14K and lactate 3 so she was given broad spectrum antibiotics and hospitalists were asked to keep for observation.  CXR clear.  UA pending.  Blood cultures obtained.    Patient without focal complaints, just malaise and weakness during hypotension episodes, but now after fluids again feeling normal.  Grieving her brother who passed recently.        Review of Systems  Constitutional:  Negative for chills, diaphoresis, fever and malaise/fatigue.  HENT:  Negative for congestion and sore throat.   Respiratory:  Negative for cough, sputum production and shortness of breath.   Cardiovascular:  Negative for chest pain.  Gastrointestinal:  Negative for abdominal pain and nausea.  Genitourinary:  Negative for dysuria, flank pain, frequency, hematuria and urgency.  Musculoskeletal:  Negative for joint pain and myalgias.  Skin:  Negative for rash.   Neurological:  Positive for seizures and loss of consciousness. Negative for dizziness, speech change, focal weakness and weakness.  All other systems reviewed and are negative.    Past Medical History:  Diagnosis Date   Arthritis    Cerebrovascular disease    CHF (congestive heart failure) (HCC)    CKD (chronic kidney disease)    Sees Dr Gearline   Coronary artery disease    Depression    Diabetes (HCC)    INSULIN  DEPENDENT   Diabetic peripheral neuropathy (HCC)    Diabetic retinopathy (HCC)    Diverticulitis    GERD (gastroesophageal reflux disease)    History of CVA (cerebrovascular accident)    x 2 no residulal   Hyperlipidemia    Hypertension    Obesity    Renal lesion    S/P TAVR (transcatheter aortic valve replacement) 11/13/2018   s/p TAVR with a 23mm Edwards S3U via the TF approach by Dr. Dusty and Dr. Verlin   Severe aortic stenosis    Past Surgical History:  Procedure Laterality Date   ABDOMINAL HYSTERECTOMY     AV FISTULA PLACEMENT Left 12/18/2017   Procedure: ARTERIOVENOUS (AV) FISTULA CREATION ARM;  Surgeon: Gretta Lonni PARAS, MD;  Location: [facility] OR;  Service: Vascular;  Laterality: Left;   bilateral cataract surgery     CARDIAC CATHETERIZATION  01/07/2014   DR LADONA   COLON RESECTION  02/1999   COLONOSCOPY     COLOSTOMY  02/1999   COLOSTOMY  CLOSURE  07/1999   EYE SURGERY Left 2019   FISTULA SUPERFICIALIZATION Left 06/29/2018   Procedure: FISTULA SUPERFICIALIZATION LEFT BRACHIOCEPHALIC;  Surgeon: Gretta Lonni PARAS, MD;  Location: [facility] OR;  Service: Vascular;  Laterality: Left;   LEFT HEART CATHETERIZATION WITH CORONARY ANGIOGRAM N/A 01/07/2014   Procedure: LEFT HEART CATHETERIZATION WITH CORONARY ANGIOGRAM;  Surgeon: Erick JONELLE Bergamo, MD;  Location: [facility] CATH LAB;  Service: Cardiovascular;  Laterality: N/A;   LOOP RECORDER INSERTION N/A 02/17/2020   Procedure: LOOP RECORDER INSERTION;  Surgeon: Fernande Elspeth BROCKS, MD;  Location: [facility] INVASIVE CV LAB;  Service:  Cardiovascular;  Laterality: N/A;   middle cerebral artery stent placement Right    OTHER SURGICAL HISTORY     laser surgery   PTCA  01/07/2014   DES to RCA    DR BERGAMO   right knee surgery Right    for infection   RIGHT/LEFT HEART CATH AND CORONARY ANGIOGRAPHY N/A 10/23/2018   Procedure: RIGHT/LEFT HEART CATH AND CORONARY ANGIOGRAPHY;  Surgeon: Elmira Newman PARAS, MD;  Location: [facility] INVASIVE CV LAB;  Service: Cardiovascular;  Laterality: N/A;   TEE WITHOUT CARDIOVERSION N/A 11/13/2018   Procedure: TRANSESOPHAGEAL ECHOCARDIOGRAM (TEE);  Surgeon: Verlin Lonni BIRCH, MD;  Location: [facility] OR;  Service: Open Heart Surgery;  Laterality: N/A;   TRANSCATHETER AORTIC VALVE REPLACEMENT, TRANSFEMORAL  11/13/2018   TRANSCATHETER AORTIC VALVE REPLACEMENT, TRANSFEMORAL N/A 11/13/2018   Procedure: TRANSCATHETER AORTIC VALVE REPLACEMENT, TRANSFEMORAL;  Surgeon: Verlin Lonni BIRCH, MD;  Location: [facility] OR;  Service: Open Heart Surgery;  Laterality: N/A;   Social History:  reports that she has never smoked. She has never used smokeless tobacco. She reports that she does not drink alcohol  and does not use drugs.  No Known Allergies  Family History  Problem Relation Age of Onset   Diabetes Mother    Heart disease Mother    Prostate cancer Father    Hypertension Brother    Prostate cancer Brother     Prior to Admission medications   Medication Sig Start Date End Date Taking? Authorizing Provider  aspirin  EC 81 MG tablet Take 81 mg by mouth daily.    [provider]  folic acid  (FOLVITE ) 1 MG tablet Take 1 mg by mouth daily.    [provider]  HUMALOG KWIKPEN 100 UNIT/ML KwikPen Inject 1-5 Units into the skin See admin instructions. Inject 1-5 units 3 times daily with meals and at bedtime. Dose depends on what pt is eating. 05/28/18   [provider]  hydrALAZINE  (APRESOLINE ) 50 MG tablet Take 1 tablet (50 mg total) by mouth 3 (three) times daily. 12/14/22   Dezii,  Alexandra, DO  Insulin  Degludec (TRESIBA ) 100 UNIT/ML SOLN Inject 10 Units into the skin at bedtime. Patient taking differently: Inject 8 Units into the skin at bedtime. 01/24/22   Drusilla Sabas RAMAN, MD  isosorbide  mononitrate (IMDUR ) 120 MG 24 hr tablet TAKE 2 TABLETS BY MOUTH DAILY 03/04/22   Patwardhan, Newman PARAS, MD  lacosamide  100 MG TABS Take 1 tablet (100 mg total) by mouth 2 (two) times daily. 04/19/23   Penumalli, Eduard JONELLE, MD  levETIRAcetam  (KEPPRA ) 500 MG tablet Take 500 mg by mouth 2 (two) times daily. Mon wed fri take additional 500 mg after dialysis    [provider]  pantoprazole  (PROTONIX ) 40 MG tablet Take 1 tablet (40 mg total) by mouth daily at 12 noon. 03/02/22   Drusilla Sabas RAMAN, MD  venlafaxine  (EFFEXOR ) 75 MG tablet Take 75 mg by mouth daily.  Takes in additional to the 150 mg capsule    [provider]  venlafaxine  XR (EFFEXOR -XR) 150 MG 24 hr capsule Take 1 capsule (150 mg total) by mouth daily. Patient taking differently: Take 150 mg by mouth daily. Pt takes this in addition to the 75 mg capsule 02/27/20   [name]    Physical Exam: Vitals:   11/10/23 1715 11/10/23 1830 11/10/23 1900 11/10/23 2030  BP: (!) 102/50 119/68 (!) 140/59 136/64  Pulse: 92 87 88 88  Resp: 17 (!) 21 20 18   Temp:    97.9 F (36.6 C)  TempSrc:      SpO2: 99% 100% 99% 99%  Weight:      Height:       Pleasant elderly female, lying in bed, no acute distress, interactive Anicteric, conjunctiva pink, lids and lashes normal.  No nasal deformity, discharge, or epistaxis Oropharynx moist, no oral lesions, trachea midline, no neck masses RRR, soft systolic murmur, no peripheral edema, no JVD Respiratory rate normal, lung sounds clear, good air movement bilaterally, no rales or wheezes Abdomen soft, no tenderness palpation in all quadrants, large midline scar, very old and well-healed Moderately reduced muscle mass and subcutaneous fat diffusely, thenar wasting Attention normal,  affect pleasant, judgment and insight appear normal, face symmetric, speech fluent, mild generalized weakness but symmetric      Data Reviewed: Basic metabolic panel shows mild hypokalemia, elevated glucose Lactic acid 3 Blood cell count 14, hemoglobin and platelets normal Chest x-ray, personally reviewed, shows no airspace disease or opacity    Assessment and Plan: * Hypotension Generalized tonic clonic seizure Elevated lactic acid No focal symptoms of infection.  No cough, dyspea, sputum, urinary irritative symptoms.  No fever, malaise prior to admission.  Overall I suspect her WBC and lactic acidosis are more likely seizure related. - Hold antibiotics - Hold home Imdur  and hydralazine   - Trend WBC, lactate - Trend fever curve - Follow UA and blood cultures    Focal seizure (HCC) - Continue home Keppra  and Vimpat   ESRD on dialysis [facility] - Will need Nephrology consultation Monday if still present in hospital  Malnutrition of moderate degree As evidenced by moderately reduced muscle mass and fat diffusely.  - Consult dietitian  DM (diabetes mellitus), type 2 with renal complications (HCC) Hyperglycemia at admission 266 - SS correction insulin  - Continue degludec  Hypokalemia Will defer supplement given ESRD - Repeat BMP  S/P TAVR (transcatheter aortic valve replacement)    Essential hypertension Hypotensive on admission - Hold hydralazine , Imdur   Coronary artery disease involving native coronary artery of native heart without angina pectoris Cerebrovascular disease On Repatha .  Has mild left sided weakness at baseline. - Continue aspirin , Lipitor - Hold Imdur  given hypotension          Advance Care Planning: Full code, confirmed with patient and daughters  Consults: None needed  Family Communication: Daughter at the bedside, other daughter by phone  Severity of Illness: The appropriate patient status for this patient is OBSERVATION.  Observation status is judged to be reasonable and necessary in order to provide the required intensity of service to ensure the patient's safety. The patient's presenting symptoms, physical exam findings, and initial radiographic and laboratory data in the context of their medical condition is felt to place them at decreased risk for further clinical deterioration. Furthermore, it is anticipated that the patient will be medically stable for discharge from the hospital within 2 midnights of admission.   Author: Lonni  SHAUNNA Dalton, MD 11/10/2023 9:38 PM  For on call review [email or web address].

## 2023-11-10 NOTE — ED Provider Notes (Signed)
 Care transferred to me.  Patient's blood pressure has dropped back down into the 70s and 80s.  Will give her a 500 cc IV fluid bolus.  She has a nonspecific leukocytosis.  Thus lactic acid was added and found to be elevated.  She has continued to have soft blood pressures though it is better after the fluids.  Due to this I have decided to give her a broad antibiotic for possible sepsis.  Unclear source.  Will In-N-Out cath for urine but she does still make some urine.  Otherwise, discussed case with Dr. Jonel for admission.  CRITICAL CARE Performed by: Glendia ONEIDA Breeding   Total critical care time: 30 minutes  Critical care time was exclusive of separately billable procedures and treating other patients.  Critical care was necessary to treat or prevent imminent or life-threatening deterioration.  Critical care was time spent personally by me on the following activities: development of treatment plan with patient and/or surrogate as well as nursing, discussions with consultants, evaluation of patient's response to treatment, examination of patient, obtaining history from patient or surrogate, ordering and performing treatments and interventions, ordering and review of laboratory studies, ordering and review of radiographic studies, pulse oximetry and re-evaluation of patient's condition.    Breeding Glendia, MD 11/10/23 [phone]

## 2023-11-10 NOTE — Assessment & Plan Note (Signed)
 Hypotensive on admission - Hold hydralazine , Imdur 

## 2023-11-10 NOTE — Sepsis Progress Note (Signed)
 Notified bedside nurse of need to draw repeat lactic acid. Unit RN on 89M notified of need for 2nd draw. Task acknowledged and plan to collect confirmed.

## 2023-11-10 NOTE — Assessment & Plan Note (Signed)
-   Continue home Keppra  and Vimpat

## 2023-11-10 NOTE — ED Triage Notes (Signed)
 Patient still has clamps on left arm at fistula.

## 2023-11-10 NOTE — Assessment & Plan Note (Signed)
 Hyperglycemia at admission 266 - SS correction insulin  - Continue degludec

## 2023-11-10 NOTE — Sepsis Progress Note (Signed)
 Notified bedside nurse of need to draw repeat lactic acid. Via secure chat. RN acknowledged with a plan via secure chat.

## 2023-11-10 NOTE — Sepsis Progress Note (Signed)
 Notified bedside nurse of need to draw repeat lactic acid. Outreach to ED nurse for follow up via secure chat. Patient reported to be admitted.

## 2023-11-10 NOTE — ED Notes (Addendum)
 51m called and aware PT is on the way up.

## 2023-11-10 NOTE — Assessment & Plan Note (Addendum)
 As evidenced by moderately reduced muscle mass and fat diffusely.  - Consult dietitian

## 2023-11-10 NOTE — ED Triage Notes (Signed)
 Patient bib [facility] from dialysis with complaints of hypotension. Staff reported that patient had 2.7L were pulled off, her BP was 60 systolic. Staff at HD facility gave fluids but unable to tell EMS the amount. EMS reports that patient normally has a right sided seizure while getting dialysis and that also occurred today.

## 2023-11-10 NOTE — ED Notes (Signed)
 PT states her arm is better after infusion rate slowed no problems noted

## 2023-11-10 NOTE — Assessment & Plan Note (Signed)
 Will defer supplement given ESRD - Repeat BMP

## 2023-11-11 DIAGNOSIS — I959 Hypotension, unspecified: Secondary | ICD-10-CM | POA: Diagnosis not present

## 2023-11-11 LAB — CBC
HCT: 31.6 % — ABNORMAL LOW (ref 36.0–46.0)
Hemoglobin: 10.4 g/dL — ABNORMAL LOW (ref 12.0–15.0)
MCH: 31.2 pg (ref 26.0–34.0)
MCHC: 32.9 g/dL (ref 30.0–36.0)
MCV: 94.9 fL (ref 80.0–100.0)
Platelets: 161 K/uL (ref 150–400)
RBC: 3.33 MIL/uL — ABNORMAL LOW (ref 3.87–5.11)
RDW: 14.2 % (ref 11.5–15.5)
WBC: 11.9 K/uL — ABNORMAL HIGH (ref 4.0–10.5)
nRBC: 0 % (ref 0.0–0.2)

## 2023-11-11 LAB — URINALYSIS, ROUTINE W REFLEX MICROSCOPIC
Bilirubin Urine: NEGATIVE
Glucose, UA: 50 mg/dL — AB
Hgb urine dipstick: NEGATIVE
Ketones, ur: NEGATIVE mg/dL
Nitrite: NEGATIVE
Protein, ur: 100 mg/dL — AB
Specific Gravity, Urine: 1.014 (ref 1.005–1.030)
pH: 6 (ref 5.0–8.0)

## 2023-11-11 LAB — GLUCOSE, CAPILLARY
Glucose-Capillary: 197 mg/dL — ABNORMAL HIGH (ref 70–99)
Glucose-Capillary: 204 mg/dL — ABNORMAL HIGH (ref 70–99)
Glucose-Capillary: 161 mg/dL — ABNORMAL HIGH (ref 70–99)
Glucose-Capillary: 147 mg/dL — ABNORMAL HIGH (ref 70–99)

## 2023-11-11 LAB — BASIC METABOLIC PANEL WITH GFR
Anion gap: 14 (ref 5–15)
BUN: 42 mg/dL — ABNORMAL HIGH (ref 8–23)
CO2: 28 mmol/L (ref 22–32)
Calcium: 8.7 mg/dL — ABNORMAL LOW (ref 8.9–10.3)
Chloride: 90 mmol/L — ABNORMAL LOW (ref 98–111)
Creatinine, Ser: 4.24 mg/dL — ABNORMAL HIGH (ref 0.44–1.00)
GFR, Estimated: 10 mL/min — ABNORMAL LOW (ref >60)
Glucose, Bld: 240 mg/dL — ABNORMAL HIGH (ref 70–99)
Potassium: 4.3 mmol/L (ref 3.5–5.1)
Sodium: 132 mmol/L — ABNORMAL LOW (ref 135–145)

## 2023-11-11 LAB — HEMOGLOBIN A1C
Hgb A1c MFr Bld: 7.9 % — ABNORMAL HIGH (ref 4.8–5.6)
Mean Plasma Glucose: 180.03 mg/dL

## 2023-11-11 LAB — MRSA NEXT GEN BY PCR, NASAL: MRSA by PCR Next Gen: NOT DETECTED

## 2023-11-11 LAB — LACTIC ACID, PLASMA: Lactic Acid, Venous: 2.9 mmol/L (ref 0.5–1.9)

## 2023-11-11 MED ORDER — NEPRO/CARBSTEADY PO LIQD
237.0000 mL | Freq: Two times a day (BID) | ORAL | Status: DC
  Administered 2023-11-11 – 2023-11-12 (×2): 237 mL via ORAL

## 2023-11-11 MED ORDER — MUPIROCIN 2 % EX OINT
1.0000 | TOPICAL_OINTMENT | Freq: Two times a day (BID) | CUTANEOUS | Status: DC
  Administered 2023-11-11 – 2023-11-12 (×3): 1 via NASAL
  Filled 2023-11-11: qty 22

## 2023-11-11 MED ORDER — CHLORHEXIDINE GLUCONATE CLOTH 2 % EX PADS
6.0000 | MEDICATED_PAD | Freq: Every day | CUTANEOUS | Status: DC
  Administered 2023-11-11 – 2023-11-12 (×2): 6 via TOPICAL

## 2023-11-11 MED ORDER — PROSOURCE PLUS PO LIQD
30.0000 mL | Freq: Two times a day (BID) | ORAL | Status: DC
  Administered 2023-11-11 – 2023-11-12 (×2): 30 mL via ORAL
  Filled 2023-11-11 (×2): qty 30

## 2023-11-11 MED ORDER — RENA-VITE PO TABS
1.0000 | ORAL_TABLET | Freq: Every day | ORAL | Status: DC
  Administered 2023-11-11: 1 via ORAL
  Filled 2023-11-11: qty 1

## 2023-11-11 NOTE — Progress Notes (Signed)
 PROGRESS NOTE    [name]  FMW:[id number] DOB: 1941-01-15 DOA: 11/10/2023 PCP: Roanna Ezekiel NOVAK, MD   Brief Narrative:  This 83 y.o. 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  Patient went for hemodialysis as per routine, she had a generalized seizure, description of the event is not possible but patient reports she passed out, does not remember anything until she was surrounded by staff.  She was told that she had a seizure and EMS was called.  Patient was noted to be hypotensive with SBP in 60s so hemodialysis staff give some fluid.  She is found to have lactic acid 3.0, WBC 14K in the ED so she was given broad-spectrum antibiotics and was admitted for further evaluation.  Assessment & Plan:   Principal Problem:   Hypotension Active Problems:   Coronary artery disease involving native coronary artery of native heart without angina pectoris   Essential hypertension   S/P TAVR (transcatheter aortic valve replacement)   Hypokalemia   DM (diabetes mellitus), type 2 with renal complications (HCC)   Malnutrition of moderate degree   ESRD on dialysis (HCC)   Focal seizure (HCC)   Hypotension: Generalized tonic clonic seizure: Elevated lactic acid: She was brought from the hemodialysis due to generalized tonic-clonic seizure and hypotension. No signs and symptoms of infection noted , Denies cough, dyspea, sputum, urinary irritative symptoms. Denies fever, malaise prior to admission.   Overall suspect her WBC and lactic acidosis are more likely seizure related. - Hold antibiotics for now. - Hold home Imdur  and hydralazine  - Trend WBC, lactate - Trend fever curve - Follow UA and blood cultures    Focal seizure (HCC) Continue home Keppra  and Vimpat .   ESRD on dialysis [facility]) Nephrology is consulted, no indication for renal replacement therapy today. Maintain  hemodialysis on MWF schedule.  Next dialysis on Monday.   Malnutrition of moderate degree: As evidenced by moderately reduced muscle mass and fat diffusely.  - Consult dietitian.   DM (diabetes mellitus), type 2 with renal complications (HCC) Hyperglycemia at admission 266. - SS correction insulin  - Continue degludec.   Hypokalemia: Defer supplement given ESRD - Repeat BMP   S/P TAVR (transcatheter aortic valve replacement) Outpatient follow-up.   Essential hypertension Hypotensive on admission - Hold hydralazine , Imdur    Coronary artery disease involving native coronary artery of native heart without angina pectoris Cerebrovascular disease Continue Repatha .  Has mild left sided weakness at baseline. - Continue aspirin , Lipitor - Hold Imdur  given hypotension     DVT prophylaxis: Heparin  Code Status: Full code Family Communication: No family at bedside Disposition Plan:    Status is: Observation The patient remains OBS appropriate and will d/c before 2 midnights.   Admitted for seizure episode and hypotension during hemodialysis.  Consultants:  Nephrology  Procedures:  CT Antimicrobials:  Anti-infectives (From admission, onward)    Start     Dose/Rate Route Frequency Ordered Stop   11/10/23 2000  ceFEPIme (MAXIPIME) 2 g in sodium chloride  0.9 % 100 mL IVPB        2 g 200 mL/hr over 30 Minutes Intravenous  Once 11/10/23 1959 11/10/23 2036   11/10/23 2000  metroNIDAZOLE (FLAGYL) IVPB 500 mg        500 mg 100 mL/hr over 60 Minutes Intravenous  Once 11/10/23 1959 11/10/23 2127   11/10/23 2000  vancomycin  (VANCOCIN ) IVPB  1000 mg/200 mL premix        1,000 mg 200 mL/hr over 60 Minutes Intravenous  Once 11/10/23 1959 11/10/23 2300      Subjective: Patient was seen and examined at bedside.  Overnight events noted. Patient reports feeling slightly better,  still feels weak and tired,  blood pressure has been improving.  Objective: Vitals:   11/10/23 2130  11/10/23 2256 11/11/23 0610 11/11/23 0913  BP: (!) 119/55 (!) 112/48 (!) 134/50 (!) 108/58  Pulse: 88 80 72 68  Resp: (!) 23 18 14 19   Temp:  98.3 F (36.8 C) 98.1 F (36.7 C) 98.1 F (36.7 C)  TempSrc:      SpO2: 100% 98% 100% (!) 74%  Weight:      Height:        Intake/Output Summary (Last 24 hours) at 11/11/2023 1232 Last data filed at 11/11/2023 0215 Gross per 24 hour  Intake 550 ml  Output 80 ml  Net 470 ml   Filed Weights   11/10/23 1500  Weight: 54.9 kg    Examination:  General exam: Appears calm and comfortable, deconditioned, not in any acute distress. Respiratory system: CTA Bilaterally. Respiratory effort normal. RR 14 Cardiovascular system: S1 & S2 heard, RRR. No JVD, murmurs, rubs, gallops or clicks.  Gastrointestinal system: Abdomen is non distended, soft and non tender. Normal bowel sounds heard. Central nervous system: Alert and oriented x 3. No focal neurological deficits. Extremities: No edema, no cyanosis, no clubbing. Skin: No rashes, lesions or ulcers Psychiatry: Judgement and insight appear normal. Mood & affect appropriate.   Data Reviewed: I have personally reviewed following labs and imaging studies  CBC: Recent Labs  Lab 11/10/23 1534 11/11/23 0455  WBC 14.6* 11.9*  NEUTROABS 11.4*  --   HGB 12.1 10.4*  HCT 36.3 31.6*  MCV 96.3 94.9  PLT 163 161   Basic Metabolic Panel: Recent Labs  Lab 11/10/23 1534 11/11/23 0455  NA 137 132*  K 3.3* 4.3  CL 92* 90*  CO2 28 28  GLUCOSE 266* 240*  BUN 24* 42*  CREATININE 3.04* 4.24*  CALCIUM  8.2* 8.7*   GFR: Estimated Creatinine Clearance: 8 mL/min (A) (by C-G formula based on SCr of 4.24 mg/dL (H)). Liver Function Tests: No results for input(s): AST, ALT, ALKPHOS, BILITOT, PROT, ALBUMIN  in the last 168 hours. No results for input(s): LIPASE, AMYLASE in the last 168 hours. No results for input(s): AMMONIA in the last 168 hours. Coagulation Profile: No results for  input(s): INR, PROTIME in the last 168 hours. Cardiac Enzymes: No results for input(s): CKTOTAL, CKMB, CKMBINDEX, TROPONINI in the last 168 hours. BNP (last 3 results) No results for input(s): PROBNP in the last 8760 hours. HbA1C: Recent Labs    11/11/23 0455  HGBA1C 7.9*   CBG: Recent Labs  Lab 11/10/23 2258 11/11/23 0918 11/11/23 1146  GLUCAP 334* 197* 204*   Lipid Profile: No results for input(s): CHOL, HDL, LDLCALC, TRIG, CHOLHDL, LDLDIRECT in the last 72 hours. Thyroid Function Tests: No results for input(s): TSH, T4TOTAL, FREET4, T3FREE, THYROIDAB in the last 72 hours. Anemia Panel: No results for input(s): VITAMINB12, FOLATE, FERRITIN, TIBC, IRON, RETICCTPCT in the last 72 hours. Sepsis Labs: Recent Labs  Lab 11/10/23 1945 11/11/23 0946  LATICACIDVEN 3.0* 2.9*    Recent Results (from the past 240 hours)  Culture, blood (routine x 2)     Status: None (Preliminary result)   Collection Time: 11/10/23  7:35 PM   Specimen: BLOOD  Result  Value Ref Range Status   Specimen Description BLOOD BLOOD RIGHT ARM  Final   Special Requests   Final    BOTTLES DRAWN AEROBIC AND ANAEROBIC Blood Culture adequate volume   Culture   Final    NO GROWTH < 12 HOURS Performed at [facility], [address], [place], [place] [address]    Report Status PENDING  Incomplete  Culture, blood (routine x 2)     Status: None (Preliminary result)   Collection Time: 11/10/23  8:02 PM   Specimen: BLOOD  Result Value Ref Range Status   Specimen Description BLOOD BLOOD RIGHT HAND  Final   Special Requests AEROBIC BOTTLE ONLY Blood Culture adequate volume  Final   Culture   Final    NO GROWTH < 12 HOURS Performed at [facility], [address], [place], [place] [address]    Report Status PENDING  Incomplete  MRSA Next Gen by PCR, Nasal     Status: None   Collection Time: 11/11/23  1:05 AM   Specimen: Nasal Mucosa; Nasal Swab   Result Value Ref Range Status   MRSA by PCR Next Gen NOT DETECTED NOT DETECTED Final    Comment: (NOTE) The GeneXpert MRSA Assay (FDA approved for NASAL specimens only), is one component of a comprehensive MRSA colonization surveillance program. It is not intended to diagnose MRSA infection nor to guide or monitor treatment for MRSA infections. Test performance is not FDA approved in patients less than 37 years old. Performed at [facility], [address], [place], [place] [address]    Radiology Studies: DG Chest Portable 1 View Result Date: 11/10/2023 CLINICAL DATA:  Weakness. EXAM: PORTABLE CHEST 1 VIEW COMPARISON:  03/06/2023 FINDINGS: The cardiomediastinal contours are normal. TAVR. The lungs are clear. Pulmonary vasculature is normal. No consolidation, pleural effusion, or pneumothorax. No acute osseous abnormalities are seen. Left chest wall loop recorder. IMPRESSION: No acute chest findings. Electronically Signed   By: Andrea Gasman [name]   On: 11/10/2023 18:18   Scheduled Meds:  (feeding supplement) PROSource Plus  30 mL Oral BID BM   aspirin  EC  81 mg Oral Daily   Chlorhexidine  Gluconate Cloth  6 each Topical Daily   feeding supplement (NEPRO CARB STEADY)  237 mL Oral BID BM   heparin   5,000 Units Subcutaneous Q8H   insulin  aspart  0-6 Units Subcutaneous TID WC   insulin  glargine  8 Units Subcutaneous QHS   lacosamide   100 mg Oral BID   levETIRAcetam   500 mg Oral BID   And   [START ON 11/13/2023] levETIRAcetam   500 mg Oral Q M,W,F-HD   multivitamin  1 tablet Oral QHS   mupirocin ointment  1 Application Nasal BID   pantoprazole   40 mg Oral Q1200   venlafaxine  XR  225 mg Oral Daily   Continuous Infusions:   LOS: 0 days    Time spent: 50 mins    Darcel Dawley, MD [facility]   If 7PM-7AM, please contact night-coverage

## 2023-11-11 NOTE — Consult Note (Signed)
 ESRD Consult Note  Reason for consult: ESRD, provision of dialysis  Assessment/Recommendations:  ESRD -outpatient HD orders: [facility], MWF. F160.  3 hours 45 minutes.  EDW 58.3 kg.  LUE AVF.  Flow rates: 400/autoflow 1.5.  2K/2 calcium .  Heparin : 2000 units bolus.  Meds: Venofer 50 mg once weekly, Mircera 30 mcg every 4 weeks (last dose 10/22), calcitriol  0.5 mcg every treatment - No indications for renal replacement therapy today. Will maintain HD on MWF schedule.  Next dialysis on Monday if still here  Hypotension -BP meds on hold-agree. BP currently acceptable, may need to raise EDW on discharge  Focal seizure -per primary. Home keppra  and vimpat  resumed  Elevated lactate -suspected to be secondary to seizure, recommend trending  Leukocytosis -abx on hold, WBC down to 11.9, per primary  Volume/ hypertension  -UF as tolerated with HD, will likely need EDW to raised on discharge  Anemia of Chronic Kidney Disease Hemoglobin 10.4, stable, not due for ESA.  -Transfuse PRN for Hgb <7  Secondary Hyperparathyroidism/Hyperphosphatemia -resume home meds, monitor phos   DM2 with hyperglycemia -per primary service  # Additional recommendations: - Dose all meds for creatinine clearance < 10 ml/min  - Unless absolutely necessary, no MRIs with gadolinium.  - Implement save arm precautions.  Prefer needle sticks in the dorsum of the hands or wrists.  No blood pressure measurements in arm. - If blood transfusion is requested during hemodialysis sessions, please alert us  prior to the session.  - If a hemodialysis catheter line culture is requested, please alert us  as only hemodialysis nurses are able to collect those specimens.   Recommendations were discussed with the primary team.  Ephriam Stank, MD [facility]  History of Present Illness: [name] is a/an 83 y.o. female with a past medical history of ESRD, seizures, DM, HTN, CAD, history of CVA, AS status post  TAVR, OSA who presents with hypotension from dialysis (completed full treatment it seems per outpatient flowsheet).  In HD she had a generalized seizure.  She was reported to have a blood pressure in the 60s so she did get some fluids.  Her BP did initially improve upon presentation and then drop back down again and found to have an elevated lactic acid and WBC. Initially received empiric abx but now on hold. Patient seen and examined bedside. She denies any complaints. She denies any fevers, chills, chest pain, SOB, swelling, issues with her access, dizziness.   Medications:  Current Facility-Administered Medications  Medication Dose Route Frequency Provider Last Rate Last Admin   acetaminophen  (TYLENOL ) tablet 650 mg  650 mg Oral Q6H PRN Danford, Lonni SQUIBB, MD       Or   acetaminophen  (TYLENOL ) suppository 650 mg  650 mg Rectal Q6H PRN Danford, Lonni SQUIBB, MD       aspirin  EC tablet 81 mg  81 mg Oral Daily Danford, Lonni SQUIBB, MD       Chlorhexidine  Gluconate Cloth 2 % PADS 6 each  6 each Topical Daily Danford, Lonni SQUIBB, MD       heparin  injection 5,000 Units  5,000 Units Subcutaneous Q8H Jonel Lonni SQUIBB, MD   5,000 Units at 11/11/23 9388   insulin  aspart (novoLOG ) injection 0-6 Units  0-6 Units Subcutaneous TID WC Danford, Lonni SQUIBB, MD       insulin  glargine (LANTUS ) injection 8 Units  8 Units Subcutaneous QHS Jonel Lonni SQUIBB, MD   8 Units at 11/11/23 0011   lacosamide  (VIMPAT ) tablet 100  mg  100 mg Oral BID Jonel Lonni SQUIBB, MD   100 mg at 11/11/23 0011   levETIRAcetam  (KEPPRA ) tablet 500 mg  500 mg Oral BID Laron Agent, RPH   500 mg at 11/11/23 0011   And   [START ON 11/13/2023] levETIRAcetam  (KEPPRA ) tablet 500 mg  500 mg Oral Q M,W,F-HD Laron Agent, RPH       mupirocin ointment (BACTROBAN) 2 % 1 Application  1 Application Nasal BID Danford, Lonni SQUIBB, MD       ondansetron  (ZOFRAN ) tablet 4 mg  4 mg Oral Q6H PRN Danford, Lonni SQUIBB, MD        Or   ondansetron  (ZOFRAN ) injection 4 mg  4 mg Intravenous Q6H PRN Danford, Lonni SQUIBB, MD       pantoprazole  (PROTONIX ) EC tablet 40 mg  40 mg Oral Q1200 Danford, Lonni SQUIBB, MD       venlafaxine  XR (EFFEXOR -XR) 24 hr capsule 225 mg  225 mg Oral Daily Danford, Lonni SQUIBB, MD         ALLERGIES Patient has no known allergies.  MEDICAL HISTORY Past Medical History:  Diagnosis Date   Arthritis    Cerebrovascular disease    CHF (congestive heart failure) (HCC)    CKD (chronic kidney disease)    Sees Dr Gearline   Coronary artery disease    Depression    Diabetes (HCC)    INSULIN  DEPENDENT   Diabetic peripheral neuropathy (HCC)    Diabetic retinopathy (HCC)    Diverticulitis    GERD (gastroesophageal reflux disease)    History of CVA (cerebrovascular accident)    x 2 no residulal   Hyperlipidemia    Hypertension    Obesity    Renal lesion    S/P TAVR (transcatheter aortic valve replacement) 11/13/2018   s/p TAVR with a 23mm Edwards S3U via the TF approach by Dr. Dusty and Dr. Verlin   Severe aortic stenosis      SOCIAL HISTORY Social History   Socioeconomic History   Marital status: Widowed    Spouse name: Not on file   Number of children: 2   Years of education: PHD   Highest education level: Not on file  Occupational History   Occupation: Retired PhD Teacher English  Tobacco Use   Smoking status: Never   Smokeless tobacco: Never  Vaping Use   Vaping status: Never Used  Substance and Sexual Activity   Alcohol  use: No   Drug use: No   Sexual activity: Not on file  Other Topics Concern   Not on file  Social History Narrative   Lives alone, has brother close by   Patient is right-handed.   Caffeine use: 2-3 cups every day   Social Drivers of Health   Financial Resource Strain: Not on file  Food Insecurity: No Food Insecurity (11/10/2023)   Hunger Vital Sign    Worried About Running Out of Food in the Last Year: Never true    Ran Out of Food in  the Last Year: Never true  Transportation Needs: No Transportation Needs (11/10/2023)   PRAPARE - Administrator, Civil Service (Medical): No    Lack of Transportation (Non-Medical): No  Physical Activity: Not on file  Stress: Not on file  Social Connections: Unknown (11/11/2023)   Social Connection and Isolation Panel    Frequency of Communication with Friends and Family: Twice a week    Frequency of Social Gatherings with Friends and Family: Twice a week  Attends Religious Services: Patient declined    Active Member of Clubs or Organizations: Patient declined    Attends Banker Meetings: Patient declined    Marital Status: Widowed  Intimate Partner Violence: Not At Risk (11/10/2023)   Humiliation, Afraid, Rape, and Kick questionnaire    Fear of Current or Ex-Partner: No    Emotionally Abused: No    Physically Abused: No    Sexually Abused: No     FAMILY HISTORY Family History  Problem Relation Age of Onset   Diabetes Mother    Heart disease Mother    Prostate cancer Father    Hypertension Brother    Prostate cancer Brother      Review of Systems: 12 systems were reviewed and negative except per HPI  Physical Exam: Vitals:   11/11/23 0610 11/11/23 0913  BP: (!) 134/50 (!) 108/58  Pulse: 72 68  Resp: 14 19  Temp: 98.1 F (36.7 C) 98.1 F (36.7 C)  SpO2: 100% (!) 74%   No intake/output data recorded.  Intake/Output Summary (Last 24 hours) at 11/11/2023 0959 Last data filed at 11/11/2023 0215 Gross per 24 hour  Intake 550 ml  Output 80 ml  Net 470 ml   General: well-appearing, no acute distress HEENT: anicteric sclera, MMM CV: normal rate, no murmurs, no edema Lungs: bilateral chest rise, normal wob Abd: soft, non-tender, non-distended Skin: no visible lesions or rashes Psych: alert, engaged, appropriate mood and affect Neuro: normal speech, no gross focal deficits, awake/alert Dialysis access: LUE AVF +b/t  Test  Results Reviewed Lab Results  Component Value Date   NA 132 (L) 11/11/2023   K 4.3 11/11/2023   CL 90 (L) 11/11/2023   CO2 28 11/11/2023   BUN 42 (H) 11/11/2023   CREATININE 4.24 (H) 11/11/2023   CALCIUM  8.7 (L) 11/11/2023   ALBUMIN  2.8 (L) 02/16/2023   PHOS 3.6 03/02/2022    I have reviewed relevant outside healthcare records

## 2023-11-11 NOTE — Sepsis Progress Note (Signed)
 Notified provider of need to draw repeat lactic acid. Notified via secure chat, order confirmed.

## 2023-11-11 NOTE — Evaluation (Signed)
 Physical Therapy Evaluation  Patient Details Name: [name] MRN: [id number] DOB: [birth year] Today's Date: 11/11/2023  History of Present Illness  Pt is an 83 y/o female who presents 11/10/2023 from outpatient HD after having a seizure. Pt was also found to be hypotensive. PMH significant for CHF, CKD, CAD, IDDM, diabetic peripheral neuropathy, diabetic retinopathy, CVA, HTN, renal lesion, TAVR.   Clinical Impression  Pt admitted with above diagnosis. Pt currently with functional limitations due to the deficits listed below (see PT Problem List). At the time of PT eval pt was able to perform transfers with gross +2 min assist and RW for support. Immediately prior to PT session, NT reports difficulty transferring pt to [facility]. Pt reports that transfer to chair seemed easier for her than when she attempted to get on the [facility] earlier. Of note, BP monitored throughout session and was stable throughout functional mobility. Pt reports mild lightheadedness when sitting up that resolved with time, and was otherwise asymptomatic. Pt will benefit from acute skilled PT to increase their independence and safety with mobility to allow discharge.     Orthostatic BPs  Supine HOB 36 122/48  Sitting 119/53  Sitting after transfer to the chair 125/47         If plan is discharge home, recommend the following: A little help with walking and/or transfers;A little help with bathing/dressing/bathroom;Assistance with cooking/housework;Assist for transportation;Help with stairs or ramp for entrance   Can travel by private vehicle        Equipment Recommendations None recommended by PT  Recommendations for Other Services       Functional Status Assessment Patient has had a recent decline in their functional status and demonstrates the ability to make significant improvements in function in a reasonable and predictable amount of time.     Precautions / Restrictions Precautions Precautions: Fall Recall of  Precautions/Restrictions: Intact Restrictions Weight Bearing Restrictions Per Provider Order: No      Mobility  Bed Mobility Overal bed mobility: Needs Assistance Bed Mobility: Supine to Sit     Supine to sit: Min assist     General bed mobility comments: Assist for LLE advancement towards EOB. HOB ~30 and pt relying heavily on bed rail for support. Reports she has a bed rail at home. Pt able to scoot out and get feet on the floor without assist.    Transfers Overall transfer level: Needs assistance Equipment used: Rolling walker (2 wheels) Transfers: Sit to/from Stand, Bed to chair/wheelchair/BSC Sit to Stand: Min assist, +2 physical assistance   Step pivot transfers: Min assist, +2 physical assistance       General transfer comment: Light +2 assist to power up to full stand and gain/maintain standing balance. Pt was able to take a few pivotal steps around to the chair with min assist for walker management. VC's to back all the way up to the chair and reach back for arm rests before sitting.    Ambulation/Gait               General Gait Details: Did not progress to gait training this session.  Stairs            Wheelchair Mobility     Tilt Bed    Modified Rankin (Stroke Patients Only)       Balance  Pertinent Vitals/Pain Pain Assessment Pain Assessment: No/denies pain    Home Living Family/patient expects to be discharged to:: Private residence Living Arrangements: Children Available Help at Discharge: Family;Personal care attendant;Available 24 hours/day Type of Home: House Home Access: Stairs to enter Entrance Stairs-Rails: Right Entrance Stairs-Number of Steps: 2 Alternate Level Stairs-Number of Steps: flight Home Layout: Two level;1/2 bath on main level;Able to live on main level with bedroom/bathroom Home Equipment: Shower seat - built [employer] (2 wheels);Rollator  (4 wheels)      Prior Function               Mobility Comments: Uses the walker all the time ADLs Comments: PCA assists with bathing and dressing. States she has +2 assist to negotiate the flight of stairs up to the shower 1x/week.     Extremity/Trunk Assessment   Upper Extremity Assessment Upper Extremity Assessment: LUE deficits/detail LUE Deficits / Details: Decreased strength and muscular endurance consistent with residual deficits from prior stroke. Deficits more profound during functional activity vs with MMT/AROM.    Lower Extremity Assessment Lower Extremity Assessment: LLE deficits/detail LLE Deficits / Details: Decreased strength and muscular endurance consistent with residual deficits from prior stroke. Deficits more profound during functional activity vs with MMT/AROM.    Cervical / Trunk Assessment Cervical / Trunk Assessment: Kyphotic;Other exceptions Cervical / Trunk Exceptions: Forward head posture with rounded shoulders  Communication   Communication Communication: Impaired Factors Affecting Communication: Reduced clarity of speech    Cognition Arousal: Alert Behavior During Therapy: WFL for tasks assessed/performed   PT - Cognitive impairments: No family/caregiver present to determine baseline                         Following commands: Intact       Cueing Cueing Techniques: Verbal cues, Gestural cues     General Comments      Exercises     Assessment/Plan    PT Assessment Patient needs continued PT services  PT Problem List Decreased strength;Decreased activity tolerance;Decreased balance;Decreased mobility;Decreased knowledge of use of DME;Decreased safety awareness;Decreased knowledge of precautions       PT Treatment Interventions DME instruction;Gait training;Stair training;Functional mobility training;Therapeutic activities;Therapeutic exercise;Balance training;Patient/family education    PT Goals (Current goals can be  found in the Care Plan section)  Acute Rehab PT Goals Patient Stated Goal: Be able to go home at d/c PT Goal Formulation: With patient Time For Goal Achievement: 11/25/23 Potential to Achieve Goals: Good    Frequency Min 2X/week     Co-evaluation               AM-PAC PT 6 Clicks Mobility  Outcome Measure Help needed turning from your back to your side while in a flat bed without using bedrails?: A Little Help needed moving from lying on your back to sitting on the side of a flat bed without using bedrails?: A Little Help needed moving to and from a bed to a chair (including a wheelchair)?: A Lot Help needed standing up from a chair using your arms (e.g., wheelchair or bedside chair)?: A Lot Help needed to walk in hospital room?: A Lot Help needed climbing 3-5 steps with a railing? : A Lot 6 Click Score: 14    End of Session Equipment Utilized During Treatment: Gait belt Activity Tolerance: Patient tolerated treatment well Patient left: in chair;with call bell/phone within reach;with chair alarm set Nurse Communication: Mobility status PT Visit Diagnosis: Unsteadiness on feet (R26.81);Difficulty in  walking, not elsewhere classified (R26.2)    Time: 8877-8854 PT Time Calculation (min) (ACUTE ONLY): 23 min   Charges:   PT Evaluation $PT Eval Moderate Complexity: 1 Mod PT Treatments $Gait Training: 8-22 mins PT General Charges $$ ACUTE PT VISIT: 1 Visit         Leita Sable, PT, DPT Acute Rehabilitation Services Secure Chat Preferred Office: [phone]   Leita JONETTA Sable 11/11/2023, 12:52 PM

## 2023-11-11 NOTE — Progress Notes (Signed)
 Initial Nutrition Assessment  DOCUMENTATION CODES:   Not applicable  INTERVENTION:   -Continue renal/ carb modified diet with 1.2 L fluid restriction -Renal MVI daily -Nepro Shake po BID, each supplement provides 425 kcal and 19 grams protein  -30 ml Prosource Plus BID, each supplement provides 100 kcals and 15 grams protein  NUTRITION DIAGNOSIS:   Increased nutrient needs related to chronic illness (ESRD on HD) as evidenced by estimated needs.  GOAL:   Patient will meet greater than or equal to 90% of their needs  MONITOR:   PO intake, Supplement acceptance  REASON FOR ASSESSMENT:   Consult Assessment of nutrition requirement/status  ASSESSMENT:   83 y.o. F with ESRD on HD MWF, DM, HTN, CAD, CVA w/ L weakness c/b partial seizures, AS s/p TAVR, and OSA not on CPAP who presented with hypotension from dialysis  Patient admitted with hypotension, generalized tonic clonic seizure, and elevated lactic acid.   Reviewed I/O's: +470 ml x 24 hours   UOP: 80 ml x 24 hours  Patient unavailable at time of visit. Attempted to speak with patient via call to hospital room phone, however, unable to reach. RD unable to obtain further nutrition-related history or complete nutrition-focused physical exam at this time.    Per H&P, patient was in her usual state of health until experiencing generalized weakness and seizures at HD facility.   Patient currently on a renal/ carb modified diet with 1.2 L fluid restriction. No meal completion data available to assess at this time.   Reviewed weight history; patient has experienced a 2.3% weight loss over the past 7 months, which is not significant for time frame. Per nephrology notes, EDW 58.3 kg. Plan for next HD on 11/13/23. She is currently below dry weight.   Patient with prior history of moderate malnutrition. Suspect malnutrition is ongoing, however, unable to identify at this time. She would greatly benefit from addition of oral  nutrition supplements.   Medications reviewed and include vimpat , keppra , and protonix .   Lab Results  Component Value Date   HGBA1C 7.9 (H) 11/11/2023   PTA DM medications are 1-5 units insulin  lispro TID and 8 units insulin  degludec daily.   Labs reviewed: Na: 132, CBGS: 197-334 (inpatient orders for glycemic control are 0-6 units insulin  aspart TID with meals and 8 units insulin  glargine daily at bedtime).    Diet Order:   Diet Order             Diet renal/carb modified with fluid restriction Diet-HS Snack? Nothing; Fluid restriction: 1200 mL Fluid; Room service appropriate? Yes; Fluid consistency: Thin  Diet effective now                   EDUCATION NEEDS:   No education needs have been identified at this time  Skin:  Skin Assessment: Reviewed RN Assessment  Last BM:  Unknown  Height:   Ht Readings from Last 1 Encounters:  11/10/23 5' 2.5 (1.588 m)    Weight:   Wt Readings from Last 1 Encounters:  11/10/23 54.9 kg    Ideal Body Weight:  51.1 kg  BMI:  Body mass index is 21.78 kg/m.  Estimated Nutritional Needs:   Kcal:  1650-1850  Protein:  85-100 grams  Fluid:  1000 ml + UOP    Margery ORN, RD, LDN, CDCES Registered Dietitian III Certified Diabetes Care and Education Specialist If unable to reach this RD, please use RD Inpatient group chat on secure chat between hours of 8am-4  pm daily

## 2023-11-11 NOTE — Sepsis Progress Note (Addendum)
 Follow up with bedside nurse of need/status to draw repeat lactic acid. Via secure chat.

## 2023-11-12 DIAGNOSIS — I959 Hypotension, unspecified: Secondary | ICD-10-CM | POA: Diagnosis not present

## 2023-11-12 LAB — LACTIC ACID, PLASMA: Lactic Acid, Venous: 1.7 mmol/L (ref 0.5–1.9)

## 2023-11-12 LAB — HEPATITIS B SURFACE ANTIGEN: Hepatitis B Surface Ag: NONREACTIVE

## 2023-11-12 LAB — GLUCOSE, CAPILLARY
Glucose-Capillary: 107 mg/dL — ABNORMAL HIGH (ref 70–99)
Glucose-Capillary: 136 mg/dL — ABNORMAL HIGH (ref 70–99)

## 2023-11-12 MED ORDER — CHLORHEXIDINE GLUCONATE CLOTH 2 % EX PADS
6.0000 | MEDICATED_PAD | Freq: Every day | CUTANEOUS | Status: DC
  Administered 2023-11-12: 6 via TOPICAL

## 2023-11-12 NOTE — TOC Transition Note (Addendum)
 Transition of Care [facility]) - Discharge Note   Patient Details  Name: [name] MRN: [id number] Date of Birth: 1940/03/27  Transition of Care [facility]) CM/SW Contact:  Robynn Eileen Hoose, RN Phone Number: 11/12/2023, 12:14 PM   Clinical Narrative:   Patient is being discharged today. HH PT/OT order noted, patient has used [facility] in the past for [facility]. Referral sent to [facility] with [facility], awaiting response.   1240: Lynette with [facility] accepted pt for  ordered home health services. Contact information placed on AVS.         Patient Goals and CMS Choice            Discharge Placement                       Discharge Plan and Services Additional resources added to the After Visit Summary for                                       Social Drivers of Health (SDOH) Interventions SDOH Screenings   Food Insecurity: No Food Insecurity (11/10/2023)  Housing: Low Risk  (11/11/2023)  Transportation Needs: No Transportation Needs (11/10/2023)  Utilities: Not At Risk (11/10/2023)  Depression (PHQ2-9): Medium Risk (03/20/2020)  Social Connections: Unknown (11/11/2023)  Tobacco Use: Low Risk  (11/10/2023)     Readmission Risk Interventions    09/09/2021   10:21 AM  Readmission Risk Prevention Plan  Transportation Screening Complete  PCP or Specialist Appt within 5-7 Days Complete  Home Care Screening Complete  Medication Review (RN CM) Complete

## 2023-11-12 NOTE — Discharge Summary (Signed)
 Physician Discharge Summary  [name] DOB: [birth year] DOA: 11/10/2023  PCP: Roanna Ezekiel NOVAK, MD  Admit date: 11/10/2023  Discharge date: 11/12/2023  Admitted From: Home  Disposition:  Home Health Services.  Recommendations for Outpatient Follow-up:  Follow up with PCP in 1-2 weeks. Please obtain BMP/CBC in one week. Advised to follow-up with Nephrology for continuation of hemodialysis. Advised to continue Vimpat  and Keppra  for seizure disorder. Advised to hold hydralazine  and Imdur  for low blood pressure and resume if blood pressure improved or elevated.  Home Health:None Equipment/Devices:None  Discharge Condition: Stable CODE STATUS:Full code Diet recommendation: Renal Diet  Brief Summary/ Hospital Course: This 83 y.o. 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  Patient went for hemodialysis as per routine, she had a generalized seizure, description of the event is not possible but patient reports she passed out, does not remember anything until she was surrounded by staff.  She was told that she had a seizure and EMS was called.  Patient was noted to be hypotensive with SBP in 60s so hemodialysis staff give some fluid.  She is found to have lactic acid 3.0, WBC 14K in the ED so she was given broad-spectrum antibiotics and was admitted for further evaluation.  Antibiotics were discontinued subsequently as lactic acidosis was likely secondary to seizures. Patient was continued on Vimpat  and Keppra . Nephrology was consulted. There was no indication for acute dialysis at this point.  Patient seems much improved and wants to be discharged home.  Patient will resume hemodialysis on  Monday, Wednesday and Friday schedule.  Patient being discharged home.  Discharge Diagnoses:  Principal Problem:   Hypotension Active Problems:   Coronary artery disease  involving native coronary artery of native heart without angina pectoris   Essential hypertension   S/P TAVR (transcatheter aortic valve replacement)   Hypokalemia   DM (diabetes mellitus), type 2 with renal complications (HCC)   Malnutrition of moderate degree   ESRD on dialysis (HCC)   Focal seizure (HCC)   Hypotension: Generalized tonic clonic seizure: Elevated lactic acid: She was brought from the hemodialysis due to generalized tonic-clonic seizure and hypotension. No signs and symptoms of infection noted , Denies cough, dyspea, sputum, urinary irritative symptoms. Denies fever, malaise prior to admission.   Overall suspect her WBC and lactic acidosis are more likely seizure related. - Hold antibiotics for now. - Hold home Imdur  and hydralazine . - UA and blood cultures negative.     Focal seizure (HCC) Continue home Keppra  and Vimpat .   ESRD on dialysis [facility]) Nephrology is consulted, No indication for renal replacement therapy today. Maintain hemodialysis on MWF schedule.  Next dialysis on Monday.   Malnutrition of moderate degree: As evidenced by moderately reduced muscle mass and fat diffusely.  - Consult dietitian.   DM (diabetes mellitus), type 2 with renal complications (HCC) Hyperglycemia at admission 266. - SS correction insulin  - Continue degludec.   Hypokalemia: Defer supplement given ESRD - Repeat BMP   S/P TAVR (transcatheter aortic valve replacement) Outpatient follow-up.   Essential hypertension Hypotensive on admission - Hold hydralazine , Imdur    Coronary artery disease involving native coronary artery of native heart without angina pectoris Cerebrovascular disease Continue Repatha .  Has mild left sided weakness at baseline. - Continue aspirin , Lipitor - Hold Imdur  given hypotension  Discharge Instructions  Discharge Instructions     Call  MD for:  persistant dizziness or light-headedness   Complete by: As directed    Call MD for:   persistant nausea and vomiting   Complete by: As directed    Diet - low sodium heart healthy   Complete by: As directed    Diet general   Complete by: As directed    Discharge instructions   Complete by: As directed    Advised to follow-up with primary care physician in 1 week. Advised to follow-up with nephrology for continuation of hemodialysis. Advised to continue Vimpat  and Keppra  for seizure disorder. Advised to hold hydralazine  and Imdur  for low blood pressure and resume if blood pressure improved or elevated.   Increase activity slowly   Complete by: As directed       Allergies as of 11/12/2023   No Known Allergies      Medication List     PAUSE taking these medications    hydrALAZINE  50 MG tablet Wait to take this until: November 15, 2023 Commonly known as: APRESOLINE  Take 1 tablet (50 mg total) by mouth 3 (three) times daily.   isosorbide  mononitrate 120 MG 24 hr tablet Wait to take this until: November 15, 2023 Commonly known as: IMDUR  TAKE 2 TABLETS BY MOUTH DAILY       TAKE these medications    aspirin  EC 81 MG tablet Take 81 mg by mouth daily.   atorvastatin 40 MG tablet Commonly known as: LIPITOR Take 40 mg by mouth daily.   folic acid  1 MG tablet Commonly known as: FOLVITE  Take 1 mg by mouth daily.   HumaLOG KwikPen 100 UNIT/ML KwikPen Generic drug: insulin  lispro Inject 1-5 Units into the skin See admin instructions. Inject 1-5 units 3 times daily with meals and at bedtime. Dose depends on what pt is eating.   hydrOXYzine  10 MG tablet Commonly known as: ATARAX  Take 1 tablet 3 times a day by oral route as needed, for anxiety and sleep.   Insulin  Degludec 100 UNIT/ML Soln Commonly known as: Tresiba  Inject 10 Units into the skin at bedtime. What changed: how much to take   Lacosamide  100 MG Tabs Take 1 tablet (100 mg total) by mouth 2 (two) times daily.   levETIRAcetam  500 MG tablet Commonly known as: KEPPRA  Take 500 mg by mouth 2  (two) times daily. Mon wed fri take additional 500 mg after dialysis   pantoprazole  40 MG tablet Commonly known as: PROTONIX  Take 1 tablet (40 mg total) by mouth daily at 12 noon.   Repatha  SureClick 140 MG/ML Soaj Generic drug: Evolocumab  INJECT 1 ML SUBCUTANEOUSLY EVERY 2 WEEKS   venlafaxine  XR 150 MG 24 hr capsule Commonly known as: EFFEXOR -XR Take 1 capsule (150 mg total) by mouth daily. What changed: additional instructions   venlafaxine  XR 75 MG 24 hr capsule Commonly known as: EFFEXOR -XR Take 75 mg by mouth daily with breakfast. What changed: Another medication with the same name was changed. Make sure you understand how and when to take each.        Follow-up Information     Bakare, Mobolaji B, MD Follow up in 1 week(s).   Specialty: Internal Medicine Contact information: [address] [place] [address] [phone]         [place], [facility] Follow up.   Specialty: Home Health Services Why: Physical and Occupational therapy. Office will call to arrange follow up after hospital discharge. Contact information: [address] [place]  [address] [phone]                No Known Allergies  Consultations: Neohrology   Procedures/Studies: DG Chest Portable 1 View Result Date: 11/10/2023 CLINICAL DATA:  Weakness. EXAM: PORTABLE CHEST 1 VIEW COMPARISON:  03/06/2023 FINDINGS: The cardiomediastinal contours are normal. TAVR. The lungs are clear. Pulmonary vasculature is normal. No consolidation, pleural effusion, or pneumothorax. No acute osseous abnormalities are seen. Left chest wall loop recorder. IMPRESSION: No acute chest findings. Electronically Signed   By: Andrea Gasman [name]   On: 11/10/2023 18:18     Subjective: Patient was seen and examined at bedside.  Overnight events noted. Patient reports feeling much improved and wants to be discharged home.  Discharge Exam: Vitals:   11/12/23  0509 11/12/23 0826  BP: (!) 127/44 (!) 107/50  Pulse: (!) 58 64  Resp: 18 18  Temp: 97.7 F (36.5 C) 98.3 F (36.8 C)  SpO2: 100% 100%   Vitals:   11/11/23 1946 11/11/23 2345 11/12/23 0509 11/12/23 0826  BP: (!) 136/48 (!) 120/51 (!) 127/44 (!) 107/50  Pulse: 67 (!) 58 (!) 58 64  Resp: 18 19 18 18   Temp: 98.3 F (36.8 C) 98.3 F (36.8 C) 97.7 F (36.5 C) 98.3 F (36.8 C)  TempSrc: Oral Oral Oral   SpO2: 100% 95% 100% 100%  Weight:      Height:        General: Pt is alert, awake, not in acute distress Cardiovascular: RRR, S1/S2 +, no rubs, no gallops Respiratory: CTA bilaterally, no wheezing, no rhonchi Abdominal: Soft, NT, ND, bowel sounds + Extremities: no edema, no cyanosis    The results of significant diagnostics from this hospitalization (including imaging, microbiology, ancillary and laboratory) are listed below for reference.     Microbiology: Recent Results (from the past 240 hours)  Culture, blood (routine x 2)     Status: None (Preliminary result)   Collection Time: 11/10/23  7:35 PM   Specimen: BLOOD  Result Value Ref Range Status   Specimen Description BLOOD BLOOD RIGHT ARM  Final   Special Requests   Final    BOTTLES DRAWN AEROBIC AND ANAEROBIC Blood Culture adequate volume   Culture   Final    NO GROWTH 2 DAYS Performed at [facility], [address], [place], [place] [address]    Report Status PENDING  Incomplete  Culture, blood (routine x 2)     Status: None (Preliminary result)   Collection Time: 11/10/23  8:02 PM   Specimen: BLOOD  Result Value Ref Range Status   Specimen Description BLOOD BLOOD RIGHT HAND  Final   Special Requests AEROBIC BOTTLE ONLY Blood Culture adequate volume  Final   Culture   Final    NO GROWTH 2 DAYS Performed at [facility], [address], [place], [place] [address]    Report Status PENDING  Incomplete  MRSA Next Gen by PCR, Nasal     Status: None   Collection Time: 11/11/23  1:05 AM   Specimen:  Nasal Mucosa; Nasal Swab  Result Value Ref Range Status   MRSA by PCR Next Gen NOT DETECTED NOT DETECTED Final    Comment: (NOTE) The GeneXpert MRSA Assay (FDA approved for NASAL specimens only), is one component of a comprehensive MRSA colonization surveillance program. It is not intended to diagnose MRSA infection nor to guide or monitor treatment for MRSA infections. Test performance is not FDA approved in patients less than 2  years old. Performed at [facility], [address], [place], [place] [address]      Labs: BNP (last 3 results) No results for input(s): BNP in the last 8760 hours. Basic Metabolic Panel: Recent Labs  Lab 11/10/23 1534 11/11/23 0455  NA 137 132*  K 3.3* 4.3  CL 92* 90*  CO2 28 28  GLUCOSE 266* 240*  BUN 24* 42*  CREATININE 3.04* 4.24*  CALCIUM  8.2* 8.7*   Liver Function Tests: No results for input(s): AST, ALT, ALKPHOS, BILITOT, PROT, ALBUMIN  in the last 168 hours. No results for input(s): LIPASE, AMYLASE in the last 168 hours. No results for input(s): AMMONIA in the last 168 hours. CBC: Recent Labs  Lab 11/10/23 1534 11/11/23 0455  WBC 14.6* 11.9*  NEUTROABS 11.4*  --   HGB 12.1 10.4*  HCT 36.3 31.6*  MCV 96.3 94.9  PLT 163 161   Cardiac Enzymes: No results for input(s): CKTOTAL, CKMB, CKMBINDEX, TROPONINI in the last 168 hours. BNP: Invalid input(s): POCBNP CBG: Recent Labs  Lab 11/11/23 1146 11/11/23 1634 11/11/23 2217 11/12/23 0826 11/12/23 1132  GLUCAP 204* 161* 147* 107* 136*   D-Dimer No results for input(s): DDIMER in the last 72 hours. Hgb A1c Recent Labs    11/11/23 0455  HGBA1C 7.9*   Lipid Profile No results for input(s): CHOL, HDL, LDLCALC, TRIG, CHOLHDL, LDLDIRECT in the last 72 hours. Thyroid function studies No results for input(s): TSH, T4TOTAL, T3FREE, THYROIDAB in the last 72 hours.  Invalid input(s): FREET3 Anemia work up No results  for input(s): VITAMINB12, FOLATE, FERRITIN, TIBC, IRON, RETICCTPCT in the last 72 hours. Urinalysis    Component Value Date/Time   COLORURINE YELLOW 11/11/2023 0213   APPEARANCEUR HAZY (A) 11/11/2023 0213   LABSPEC 1.014 11/11/2023 0213   PHURINE 6.0 11/11/2023 0213   GLUCOSEU 50 (A) 11/11/2023 0213   HGBUR NEGATIVE 11/11/2023 0213   BILIRUBINUR NEGATIVE 11/11/2023 0213   KETONESUR NEGATIVE 11/11/2023 0213   PROTEINUR 100 (A) 11/11/2023 0213   UROBILINOGEN 0.2 06/24/2020 1903   NITRITE NEGATIVE 11/11/2023 0213   LEUKOCYTESUR LARGE (A) 11/11/2023 0213   Sepsis Labs Recent Labs  Lab 11/10/23 1534 11/11/23 0455  WBC 14.6* 11.9*   Microbiology Recent Results (from the past 240 hours)  Culture, blood (routine x 2)     Status: None (Preliminary result)   Collection Time: 11/10/23  7:35 PM   Specimen: BLOOD  Result Value Ref Range Status   Specimen Description BLOOD BLOOD RIGHT ARM  Final   Special Requests   Final    BOTTLES DRAWN AEROBIC AND ANAEROBIC Blood Culture adequate volume   Culture   Final    NO GROWTH 2 DAYS Performed at [facility], [address] [place], [place] [address]    Report Status PENDING  Incomplete  Culture, blood (routine x 2)     Status: None (Preliminary result)   Collection Time: 11/10/23  8:02 PM   Specimen: BLOOD  Result Value Ref Range Status   Specimen Description BLOOD BLOOD RIGHT HAND  Final   Special Requests AEROBIC BOTTLE ONLY Blood Culture adequate volume  Final   Culture   Final    NO GROWTH 2 DAYS Performed at [facility], [address], [place], [place] [address]    Report Status PENDING  Incomplete  MRSA Next Gen by PCR, Nasal     Status: None   Collection Time: 11/11/23  1:05 AM   Specimen: Nasal Mucosa; Nasal  Swab  Result Value Ref Range Status   MRSA by PCR Next Gen NOT DETECTED NOT DETECTED Final    Comment: (NOTE) The GeneXpert MRSA Assay (FDA approved for NASAL specimens only), is one  component of a comprehensive MRSA colonization surveillance program. It is not intended to diagnose MRSA infection nor to guide or monitor treatment for MRSA infections. Test performance is not FDA approved in patients less than 68 years old. Performed at [facility], [address] [place], [place] [address]      Time coordinating discharge: Over 30 minutes  SIGNED:   Darcel Dawley, MD  [facility] 11/12/2023, 1:38 PM Pager   If 7PM-7AM, please contact night-coverage

## 2023-11-12 NOTE — Discharge Instructions (Signed)
 Advised to follow-up with primary care physician in 1 week. Advised to follow-up with nephrology for continuation of hemodialysis. Advised to continue Vimpat  and Keppra  for seizure disorder. Advised to hold hydralazine  and Imdur  for low blood pressure and resume if blood pressure improved or elevated.

## 2023-11-12 NOTE — Progress Notes (Signed)
 Reviewed AVS, patient expressed understanding of medications, MD follow up reviewed.   Removed IV, Site clean, dry and intact.  See LDA for information on wounds at discharge. Patient states all belongings brought to the hospital at time of admission are accounted for and packed to take home.  Nursing staff contacted to transport patient to entrance A, where family member was waiting in vehicle to transport home.

## 2023-11-12 NOTE — Progress Notes (Addendum)
 [facility] Progress Note   Subjective:    Patient was sent to ED after HD on 10/24 2nd hypotension and seizure-like activity. Noted BP meds placed on hold here. Seen and examined patient at bedside. She reports feeling fine. Denies SOB, dizziness, and cramping. Next HD 10/27 if she remains inpatient.  Objective Vitals:   11/11/23 1946 11/11/23 2345 11/12/23 0509 11/12/23 0826  BP: (!) 136/48 (!) 120/51 (!) 127/44 (!) 107/50  Pulse: 67 (!) 58 (!) 58 64  Resp: 18 19 18 18   Temp: 98.3 F (36.8 C) 98.3 F (36.8 C) 97.7 F (36.5 C) 98.3 F (36.8 C)  TempSrc: Oral Oral Oral   SpO2: 100% 95% 100% 100%  Weight:      Height:       Physical Exam General: Elderly woman, frail, awake, alert, NAD Heart: S1 and S2; No MRGs Lungs: Clear anteriorly Abdomen: Soft and non-tender Extremities: No LE edema Dialysis Access: L AVF (+) B/T   Filed Weights   11/10/23 1500  Weight: 54.9 kg    Intake/Output Summary (Last 24 hours) at 11/12/2023 0952 Last data filed at 11/11/2023 2225 Gross per 24 hour  Intake 1440 ml  Output 0 ml  Net 1440 ml    Additional Objective Labs: Basic Metabolic Panel: Recent Labs  Lab 11/10/23 1534 11/11/23 0455  NA 137 132*  K 3.3* 4.3  CL 92* 90*  CO2 28 28  GLUCOSE 266* 240*  BUN 24* 42*  CREATININE 3.04* 4.24*  CALCIUM  8.2* 8.7*   Liver Function Tests: No results for input(s): AST, ALT, ALKPHOS, BILITOT, PROT, ALBUMIN  in the last 168 hours. No results for input(s): LIPASE, AMYLASE in the last 168 hours. CBC: Recent Labs  Lab 11/10/23 1534 11/11/23 0455  WBC 14.6* 11.9*  NEUTROABS 11.4*  --   HGB 12.1 10.4*  HCT 36.3 31.6*  MCV 96.3 94.9  PLT 163 161   Blood Culture    Component Value Date/Time   SDES BLOOD BLOOD RIGHT HAND 11/10/2023 2002   SPECREQUEST AEROBIC BOTTLE ONLY Blood Culture adequate volume 11/10/2023 2002   CULT  11/10/2023 2002    NO GROWTH 2 DAYS Performed at [facility],  [address], [place], [place] [address]    REPTSTATUS PENDING 11/10/2023 2002    Cardiac Enzymes: No results for input(s): CKTOTAL, CKMB, CKMBINDEX, TROPONINI in the last 168 hours. CBG: Recent Labs  Lab 11/11/23 0918 11/11/23 1146 11/11/23 1634 11/11/23 2217 11/12/23 0826  GLUCAP 197* 204* 161* 147* 107*   Iron Studies: No results for input(s): IRON, TIBC, TRANSFERRIN, FERRITIN in the last 72 hours. Lab Results  Component Value Date   INR 1.0 12/12/2022   INR 0.9 04/10/2022   INR 1.0 02/21/2022   Studies/Results: DG Chest Portable 1 View Result Date: 11/10/2023 CLINICAL DATA:  Weakness. EXAM: PORTABLE CHEST 1 VIEW COMPARISON:  03/06/2023 FINDINGS: The cardiomediastinal contours are normal. TAVR. The lungs are clear. Pulmonary vasculature is normal. No consolidation, pleural effusion, or pneumothorax. No acute osseous abnormalities are seen. Left chest wall loop recorder. IMPRESSION: No acute chest findings. Electronically Signed   By: Andrea Gasman [name]   On: 11/10/2023 18:18    Medications:   (feeding supplement) PROSource Plus  30 mL Oral BID BM   aspirin  EC  81 mg Oral Daily   Chlorhexidine  Gluconate Cloth  6 each Topical Daily   feeding supplement (NEPRO CARB STEADY)  237 mL Oral BID BM   heparin   5,000 Units Subcutaneous Q8H  insulin  aspart  0-6 Units Subcutaneous TID WC   insulin  glargine  8 Units Subcutaneous QHS   lacosamide   100 mg Oral BID   levETIRAcetam   500 mg Oral BID   And   [START ON 11/13/2023] levETIRAcetam   500 mg Oral Q M,W,F-HD   multivitamin  1 tablet Oral QHS   mupirocin ointment  1 Application Nasal BID   pantoprazole   40 mg Oral Q1200   venlafaxine  XR  225 mg Oral Daily    Dialysis Orders: [facility], MWF. F160.  3 hours 45 minutes.  EDW 58.3 kg.  LUE AVF.  Flow rates: 400/autoflow 1.5.  2K/2 calcium .  Heparin : 2000 units bolus.  Meds: Venofer 50 mg once weekly, Mircera 30 mcg every 4 weeks (last dose 10/22), calcitriol  0.5  mcg every treatment   Assessment/Plan:  ESRD - Will maintain HD on MWF schedule.  Next dialysis on Monday 10/27 if still here. Plan to run even for tomorrow and will schedule her 1st shift.   Hypotension -BP meds on hold-agree. BP currently acceptable, appears she has been meeting her EDW at [facility] lately. Her EDW may need to be raised at discharge.   Focal seizure -per primary. Home keppra  and vimpat  resumed. Patient has had seizure-like activity seen at her outpatient HD center before. She was followed by Neurology at one point.    Elevated lactate -suspected to be secondary to seizure, recommend trending   Leukocytosis -abx on hold, WBC down to 11.9, per primary   Volume/ hypertension  -UF as tolerated with HD, will likely need EDW to raised on discharge   Anemia of Chronic Kidney Disease Hemoglobin 10.4, stable, not due for ESA.  -Transfuse PRN for Hgb <7   Secondary Hyperparathyroidism/Hyperphosphatemia -resume home meds, monitor phos    DM2 with hyperglycemia -per primary service   # Additional recommendations: - Dose all meds for creatinine clearance < 10 ml/min  - Unless absolutely necessary, no MRIs with gadolinium.  - Implement save arm precautions.  Prefer needle sticks in the dorsum of the hands or wrists.  No blood pressure measurements in arm. - If blood transfusion is requested during hemodialysis sessions, please alert us  prior to the session.  - If a hemodialysis catheter line culture is requested, please alert us  as only hemodialysis nurses are able to collect those specimens.   Charmaine Piety, NP [facility] 11/12/2023,9:52 AM  LOS: 0 days

## 2023-11-12 NOTE — Discharge Planning (Addendum)
 [facility] Patient Discharge Orders- [facility] CLINIC: [place]  Patient's name: RETA NORGREN Admit/DC Dates: 11/10/2023 - 11/12/2023  Discharge Diagnoses: Hypotension - W/u for infection negative. Hydralazine  and Imdur  have been stopped  Generalized seizure - Continue Keppra  and Vimpat   Aranesp : Given: No    Last Hgb: 10.4 PRBC's Given: No ESA dose for discharge: Resume mircera 30 mcg IV q 4 weeks  IV Iron dose at discharge: Continue weekly Venofer  Heparin  change: No  EDW Change: Yes  New EDW: Raise EDW to 58.8kg. Patient was hypotensive on HD 10/24.  Bath Change: No  Access intervention/Change: No  Calcitriol  change: No  Discharge Labs: Calcium  8.7  K+ 4.3  IV Antibiotics: No  On Coumadin?: No    D/C Meds to be reconciled by nurse after every discharge.  Completed By: Charmaine Piety, NP   Reviewed by: MD:______ RN_______

## 2023-11-12 NOTE — Care Management Obs Status (Signed)
 MEDICARE OBSERVATION STATUS NOTIFICATION   Patient Details  Name: [name] MRN: [id number] Date of Birth: 08-12-1940   Medicare Observation Status Notification Given:  Yes    Marval Gell, RN 11/12/2023, 8:19 AM

## 2023-11-14 ENCOUNTER — Telehealth: Payer: Self-pay | Admitting: Physician Assistant

## 2023-11-14 LAB — HEPATITIS B SURFACE ANTIBODY, QUANTITATIVE: Hep B S AB Quant (Post): 583 m[IU]/mL

## 2023-11-14 NOTE — Telephone Encounter (Signed)
 Transition of care contact from inpatient facility  Date of Discharge: 11/12/23 Date of Contact: 11/14/23 Method of contact: Phone  Attempted to contact patient to discuss transition of care from inpatient admission. Call went straight to voicemail and unable to LM.   Lucie Collet, PA-C 11/14/2023, 1:04 PM  [facility] Pager: [phone]

## 2023-11-15 LAB — CULTURE, BLOOD (ROUTINE X 2)
Culture: NO GROWTH
Culture: NO GROWTH
Special Requests: ADEQUATE
Special Requests: ADEQUATE

## 2023-11-27 ENCOUNTER — Emergency Department (HOSPITAL_COMMUNITY)
Admission: EM | Admit: 2023-11-27 | Discharge: 2023-11-27 | Discharge status: Against Medical Advice | Attending: Physician Assistant | Admitting: Physician Assistant

## 2023-11-27 ENCOUNTER — Other Ambulatory Visit: Payer: Self-pay

## 2023-11-27 DIAGNOSIS — R4182 Altered mental status, unspecified: Secondary | ICD-10-CM | POA: Insufficient documentation

## 2023-11-27 DIAGNOSIS — Z5321 Procedure and treatment not carried out due to patient leaving prior to being seen by health care provider: Secondary | ICD-10-CM | POA: Insufficient documentation

## 2023-11-27 DIAGNOSIS — R258 Other abnormal involuntary movements: Secondary | ICD-10-CM | POA: Diagnosis present

## 2023-11-27 DIAGNOSIS — R079 Chest pain, unspecified: Secondary | ICD-10-CM | POA: Diagnosis not present

## 2023-11-27 DIAGNOSIS — R251 Tremor, unspecified: Secondary | ICD-10-CM | POA: Insufficient documentation

## 2023-11-27 LAB — MAGNESIUM: Magnesium: 1.8 mg/dL (ref 1.7–2.4)

## 2023-11-27 LAB — BASIC METABOLIC PANEL WITH GFR
Anion gap: 14 (ref 5–15)
BUN: 19 mg/dL (ref 8–23)
CO2: 30 mmol/L (ref 22–32)
Calcium: 8.6 mg/dL — ABNORMAL LOW (ref 8.9–10.3)
Chloride: 97 mmol/L — ABNORMAL LOW (ref 98–111)
Creatinine, Ser: 3.03 mg/dL — ABNORMAL HIGH (ref 0.44–1.00)
GFR, Estimated: 15 mL/min — ABNORMAL LOW (ref >60)
Glucose, Bld: 148 mg/dL — ABNORMAL HIGH (ref 70–99)
Potassium: 4.1 mmol/L (ref 3.5–5.1)
Sodium: 141 mmol/L (ref 135–145)

## 2023-11-27 LAB — CBC WITH DIFFERENTIAL/PLATELET
Abs Immature Granulocytes: 0.03 K/uL (ref 0.00–0.07)
Basophils Absolute: 0.1 K/uL (ref 0.0–0.1)
Basophils Relative: 1 %
Eosinophils Absolute: 0.2 K/uL (ref 0.0–0.5)
Eosinophils Relative: 2 %
HCT: 30.8 % — ABNORMAL LOW (ref 36.0–46.0)
Hemoglobin: 10.1 g/dL — ABNORMAL LOW (ref 12.0–15.0)
Immature Granulocytes: 0 %
Lymphocytes Relative: 23 %
Lymphs Abs: 2 K/uL (ref 0.7–4.0)
MCH: 32.3 pg (ref 26.0–34.0)
MCHC: 32.8 g/dL (ref 30.0–36.0)
MCV: 98.4 fL (ref 80.0–100.0)
Monocytes Absolute: 0.6 K/uL (ref 0.1–1.0)
Monocytes Relative: 7 %
Neutro Abs: 5.8 K/uL (ref 1.7–7.7)
Neutrophils Relative %: 67 %
Platelets: 289 K/uL (ref 150–400)
RBC: 3.13 MIL/uL — ABNORMAL LOW (ref 3.87–5.11)
RDW: 15.7 % — ABNORMAL HIGH (ref 11.5–15.5)
WBC: 8.7 K/uL (ref 4.0–10.5)
nRBC: 0 % (ref 0.0–0.2)

## 2023-11-27 NOTE — ED Triage Notes (Signed)
 Patient at dialysis where staff noted her to have 2 20-second episodes of full body shaking. Patient able to recount all events and denies LOC. Episode of shaking witnessed by EMS while patient was fully alert and patient states the seizures the center called for were the same as what EMS witnessed, which they state were more like tremors than a seizure.

## 2023-11-27 NOTE — ED Notes (Signed)
 Pt's daughter notified this NT that pt does not want to leave and has early on set dementia and did not remember what happened while at dialysis and does not want to be here. This NT stated that there wasn't many in front of pt to go back and that if they do decide to leave to notify myself or the RN and if anything changes to not hesitate to come back. Daughter spoke with pt and agreed and still wanted to leave.

## 2023-11-27 NOTE — ED Provider Triage Note (Signed)
 Emergency Medicine Provider Triage Evaluation Note  [name] , a 83 y.o. female  was evaluated in triage.  Pt complains of poss seizure. Noted at dialysis to have 2 episodes of left leg shaking lasted about 20 sec. Awake during episode. No tongue biting, incontinence. Hx of seizure. Complaint with meds. No HA, numbness, weakness, post ictal period. Will occasional make urine. No fever. EMS visualized episode thought it was tremors.  Review of Systems  Positive: Poss seizure Negative:   Physical Exam  BP (!) 138/51 (BP Location: Right Arm)   Pulse 71   Temp 98.2 F (36.8 C) (Oral)   Resp 18   Ht 5' 2.5 (1.588 m)   Wt 54.9 kg   SpO2 100%   BMI 21.78 kg/m  Gen:   Awake, no distress   Resp:  Normal effort  MSK:   Moves extremities without difficulty  Other:    Medical Decision Making  Medically screening exam initiated at 1:14 PM.  Appropriate orders placed.  Teri Legacy Ainley was informed that the remainder of the evaluation will be completed by another provider, this initial triage assessment does not replace that evaluation, and the importance of remaining in the ED until their evaluation is complete.  Poss seizure   Jerimey Burridge A, PA-C 11/27/23 1316

## 2023-11-28 LAB — LEVETIRACETAM LEVEL: Levetiracetam Lvl: 16.9 ug/mL (ref 10.0–40.0)

## 2023-12-09 ENCOUNTER — Other Ambulatory Visit: Payer: Self-pay | Admitting: Diagnostic Neuroimaging

## 2024-02-20 ENCOUNTER — Institutional Professional Consult (permissible substitution): Admitting: Diagnostic Neuroimaging

## 2024-02-21 ENCOUNTER — Ambulatory Visit: Admitting: Diagnostic Neuroimaging

## 2024-02-21 ENCOUNTER — Encounter: Payer: Self-pay | Admitting: Diagnostic Neuroimaging

## 2024-02-21 VITALS — BP 133/68 | HR 73 | Ht 62.5 in | Wt 121.0 lb

## 2024-02-21 DIAGNOSIS — G40109 Localization-related (focal) (partial) symptomatic epilepsy and epileptic syndromes with simple partial seizures, not intractable, without status epilepticus: Secondary | ICD-10-CM | POA: Diagnosis not present

## 2024-02-21 MED ORDER — LACOSAMIDE 150 MG PO TABS: 150.0000 mg | ORAL_TABLET | Freq: Two times a day (BID) | ORAL | 5 refills | Status: AC

## 2024-02-21 MED ORDER — LEVETIRACETAM 500 MG PO TABS: 500.0000 mg | ORAL_TABLET | Freq: Two times a day (BID) | ORAL | 2 refills | Status: AC

## 2024-02-21 MED ORDER — GABAPENTIN 100 MG PO CAPS: 100.0000 mg | ORAL_CAPSULE | Freq: Every day | ORAL | 6 refills | Status: AC

## 2024-02-21 NOTE — Progress Notes (Signed)
 "  [facility]  PATIENT: [name] DOB: 02/10/1940  REFERRING CLINICIAN: Roanna Ezekiel NOVAK, MD HISTORY FROM: patient and daughter REASON FOR VISIT: follow up   HISTORICAL  CHIEF COMPLAINT:  Chief Complaint  Patient presents with   RM 7     Patient is here with daughter for Increased seizures - last seizure was on last Wednesday at dialysis. No lingering symptoms      HISTORY OF PRESENT ILLNESS:   UPDATE (02/21/24, VRP): Since last visit, continues with intermittent left sided shaking / partial seizure events. Also has other spells that are panic / anxiety spells with general tremors. Symptoms worse with stress / anxiety.  PRIOR HPI (03/21/23, VRP): 84 year old female with history of end-stage renal disease on hemodialysis, prior right frontal and right occipital ischemic infarctions, here for evaluation of partial seizures.  History of right brain stroke in 2022.  Had some left-sided weakness which improved.  In October 2024 had first event of left-sided convulsions.  No loss of consciousness.  Symptoms worsened over time.  In November 2024 she went to hospital for evaluation.  She was diagnosed with focal seizures and started on levetiracetam .  Dose was increased after discharge but patient had side effects so dose was reduced to 500mg  twice a day with 500mg  supplement on day of dialysis.   REVIEW OF SYSTEMS: Full 14 system review of systems performed and negative with exception of: as per HPI.  ALLERGIES: No Known Allergies  HOME MEDICATIONS: Outpatient Medications Prior to Visit  Medication Sig Dispense Refill   aspirin  EC 81 MG tablet Take 81 mg by mouth daily.     atorvastatin (LIPITOR) 40 MG tablet Take 40 mg by mouth daily.     folic acid  (FOLVITE ) 1 MG tablet Take 1 mg by mouth daily.     HUMALOG KWIKPEN 100 UNIT/ML KwikPen Inject 1-5 Units into the skin See admin instructions. Inject 1-5 units 3 times daily with meals and at bedtime. Dose depends on  what pt is eating.     hydrALAZINE  (APRESOLINE ) 50 MG tablet Take 1 tablet (50 mg total) by mouth 3 (three) times daily. 90 tablet 0   hydrOXYzine  (ATARAX ) 10 MG tablet Take 1 tablet 3 times a day by oral route as needed, for anxiety and sleep.     Insulin  Degludec (TRESIBA ) 100 UNIT/ML SOLN Inject 10 Units into the skin at bedtime. (Patient taking differently: Inject 6 Units into the skin at bedtime.) 10 mL 1   isosorbide  mononitrate (IMDUR ) 120 MG 24 hr tablet TAKE 2 TABLETS BY MOUTH DAILY 180 tablet 1   pantoprazole  (PROTONIX ) 40 MG tablet Take 1 tablet (40 mg total) by mouth daily at 12 noon. 30 tablet 1   REPATHA  SURECLICK 140 MG/ML SOAJ INJECT 1 ML SUBCUTANEOUSLY EVERY 2 WEEKS     venlafaxine  XR (EFFEXOR -XR) 150 MG 24 hr capsule Take 1 capsule (150 mg total) by mouth daily. (Patient taking differently: Take 150 mg by mouth daily. Pt takes this in addition to the 75 mg capsule) 30 capsule 0   venlafaxine  XR (EFFEXOR -XR) 75 MG 24 hr capsule Take 75 mg by mouth daily with breakfast.     Lacosamide  100 MG TABS TAKE 1 TABLET BY MOUTH TWICE A DAY (Patient taking differently: Take 1 tablet by mouth 2 (two) times daily. 150mg ) 60 tablet 5   levETIRAcetam  (KEPPRA ) 500 MG tablet Take 500 mg by mouth 2 (two) times daily. Mon wed fri take additional 500 mg after dialysis  No facility-administered medications prior to visit.    PAST MEDICAL HISTORY: Past Medical History:  Diagnosis Date   Arthritis    Cerebrovascular disease    CHF (congestive heart failure) (HCC)    CKD (chronic kidney disease)    Sees Dr Gearline   Coronary artery disease    Depression    Diabetes (HCC)    INSULIN  DEPENDENT   Diabetic peripheral neuropathy (HCC)    Diabetic retinopathy (HCC)    Diverticulitis    GERD (gastroesophageal reflux disease)    History of CVA (cerebrovascular accident)    x 2 no residulal   Hyperlipidemia    Hypertension    Obesity    Renal lesion    S/P TAVR (transcatheter aortic valve  replacement) 11/13/2018   s/p TAVR with a 23mm Edwards S3U via the TF approach by Dr. Dusty and Dr. Verlin   Severe aortic stenosis     PAST SURGICAL HISTORY: Past Surgical History:  Procedure Laterality Date   ABDOMINAL HYSTERECTOMY     AV FISTULA PLACEMENT Left 12/18/2017   Procedure: ARTERIOVENOUS (AV) FISTULA CREATION ARM;  Surgeon: Gretta Lonni PARAS, MD;  Location: [facility] OR;  Service: Vascular;  Laterality: Left;   bilateral cataract surgery     CARDIAC CATHETERIZATION  01/07/2014   DR LADONA   COLON RESECTION  02/1999   COLONOSCOPY     COLOSTOMY  02/1999   COLOSTOMY CLOSURE  07/1999   EYE SURGERY Left 2019   FISTULA SUPERFICIALIZATION Left 06/29/2018   Procedure: FISTULA SUPERFICIALIZATION LEFT BRACHIOCEPHALIC;  Surgeon: Gretta Lonni PARAS, MD;  Location: [facility] OR;  Service: Vascular;  Laterality: Left;   LEFT HEART CATHETERIZATION WITH CORONARY ANGIOGRAM N/A 01/07/2014   Procedure: LEFT HEART CATHETERIZATION WITH CORONARY ANGIOGRAM;  Surgeon: Erick JONELLE Ladona, MD;  Location: [facility] CATH LAB;  Service: Cardiovascular;  Laterality: N/A;   LOOP RECORDER INSERTION N/A 02/17/2020   Procedure: LOOP RECORDER INSERTION;  Surgeon: Fernande Elspeth BROCKS, MD;  Location: [facility] INVASIVE CV LAB;  Service: Cardiovascular;  Laterality: N/A;   middle cerebral artery stent placement Right    OTHER SURGICAL HISTORY     laser surgery   PTCA  01/07/2014   DES to RCA    DR LADONA   right knee surgery Right    for infection   RIGHT/LEFT HEART CATH AND CORONARY ANGIOGRAPHY N/A 10/23/2018   Procedure: RIGHT/LEFT HEART CATH AND CORONARY ANGIOGRAPHY;  Surgeon: Elmira Newman PARAS, MD;  Location: [facility] INVASIVE CV LAB;  Service: Cardiovascular;  Laterality: N/A;   TEE WITHOUT CARDIOVERSION N/A 11/13/2018   Procedure: TRANSESOPHAGEAL ECHOCARDIOGRAM (TEE);  Surgeon: Verlin Lonni BIRCH, MD;  Location: [facility] OR;  Service: Open Heart Surgery;  Laterality: N/A;   TRANSCATHETER AORTIC VALVE REPLACEMENT, TRANSFEMORAL   11/13/2018   TRANSCATHETER AORTIC VALVE REPLACEMENT, TRANSFEMORAL N/A 11/13/2018   Procedure: TRANSCATHETER AORTIC VALVE REPLACEMENT, TRANSFEMORAL;  Surgeon: Verlin Lonni BIRCH, MD;  Location: [facility] OR;  Service: Open Heart Surgery;  Laterality: N/A;    FAMILY HISTORY: Family History  Problem Relation Age of Onset   Diabetes Mother    Heart disease Mother    Prostate cancer Father    Hypertension Brother    Prostate cancer Brother    Migraines Neg Hx    Seizures Neg Hx    Stroke Neg Hx    Sleep apnea Neg Hx     SOCIAL HISTORY: Social History   Socioeconomic History   Marital status: Widowed    Spouse name: Not on file   Number of  children: 2   Years of education: PHD   Highest education level: Not on file  Occupational History   Occupation: Retired PhD Teacher English  Tobacco Use   Smoking status: Never   Smokeless tobacco: Never  Vaping Use   Vaping status: Never Used  Substance and Sexual Activity   Alcohol  use: No   Drug use: No   Sexual activity: Not on file  Other Topics Concern   Not on file  Social History Narrative   Lives alone, has brother close by   Patient is right-handed.   Caffeine use: 1-2 cups of warm tea every day   Social Drivers of Health   Tobacco Use: Low Risk (02/21/2024)   Patient History    Smoking Tobacco Use: Never    Smokeless Tobacco Use: Never    Passive Exposure: Not on file  Financial Resource Strain: Not on file  Food Insecurity: No Food Insecurity (11/10/2023)   Epic    Worried About Programme Researcher, Broadcasting/film/video in the Last Year: Never true    Ran Out of Food in the Last Year: Never true  Transportation Needs: No Transportation Needs (11/10/2023)   Epic    Lack of Transportation (Medical): No    Lack of Transportation (Non-Medical): No  Physical Activity: Not on file  Stress: Not on file  Social Connections: Unknown (11/11/2023)   Social Connection and Isolation Panel    Frequency of Communication with Friends and Family:  Twice a week    Frequency of Social Gatherings with Friends and Family: Twice a week    Attends Religious Services: Patient declined    Database Administrator or Organizations: Patient declined    Attends Banker Meetings: Patient declined    Marital Status: Widowed  Intimate Partner Violence: Not At Risk (11/10/2023)   Epic    Fear of Current or Ex-Partner: No    Emotionally Abused: No    Physically Abused: No    Sexually Abused: No  Depression (PHQ2-9): Not on file  Alcohol  Screen: Not on file  Housing: Low Risk (11/11/2023)   Epic    Unable to Pay for Housing in the Last Year: No    Number of Times Moved in the Last Year: 0    Homeless in the Last Year: No  Utilities: Not At Risk (11/10/2023)   Epic    Threatened with loss of utilities: No  Health Literacy: Not on file     PHYSICAL EXAM  GENERAL EXAM/CONSTITUTIONAL: Vitals:  Vitals:   02/21/24 1529  BP: 133/68  Pulse: 73  Weight: 121 lb (54.9 kg)  Height: 5' 2.5 (1.588 m)   Body mass index is 21.78 kg/m. Wt Readings from Last 3 Encounters:  02/21/24 121 lb (54.9 kg)  11/27/23 121 lb (54.9 kg)  11/10/23 121 lb (54.9 kg)   Patient is in no distress; well developed, nourished and groomed; neck is supple  CARDIOVASCULAR: Examination of carotid arteries is normal; no carotid bruits Regular rate and rhythm, HOLOSYSTOLIC HARSH MURMUR over left upper precordial region Examination of peripheral vascular system by observation and palpation is normal  EYES: Ophthalmoscopic exam of optic discs and posterior segments is normal; no papilledema or hemorrhages No results found.  MUSCULOSKELETAL: Gait, strength, tone, movements noted in Neurologic exam below  NEUROLOGIC: MENTAL STATUS:      No data to display         awake, alert, oriented to person, place and time recent and remote memory intact normal attention  and concentration language fluent, comprehension intact, naming intact fund of  knowledge appropriate  CRANIAL NERVE:  2nd - no papilledema on fundoscopic exam 2nd, 3rd, 4th, 6th - RIGHT PUPIL NR; LEFT PUPIL IRREG POST-SURGICAL, visual fields full to confrontation, extraocular muscles intact, no nystagmus 5th - facial sensation symmetric 7th - facial strength symmetric 8th - hearing intact 9th - palate elevates symmetrically, uvula midline 11th - shoulder shrug symmetric 12th - tongue protrusion midline  MOTOR:  normal bulk and tone, full strength in the BUE, BLE  SENSORY:  normal and symmetric to light touch  COORDINATION:  Rosario-nose-Standifer, fine Gorgas movements normal  REFLEXES:  deep tendon reflexes present and symmetric  GAIT/STATION:  IN WHEEL CHAIR     DIAGNOSTIC DATA (LABS, IMAGING, TESTING) - I reviewed patient records, labs, notes, testing and imaging myself where available.  Lab Results  Component Value Date   WBC 8.7 11/27/2023   HGB 10.1 (L) 11/27/2023   HCT 30.8 (L) 11/27/2023   MCV 98.4 11/27/2023   PLT 289 11/27/2023      Component Value Date/Time   NA 141 11/27/2023 1314   NA 138 11/21/2018 1408   K 4.1 11/27/2023 1314   CL 97 (L) 11/27/2023 1314   CO2 30 11/27/2023 1314   GLUCOSE 148 (H) 11/27/2023 1314   BUN 19 11/27/2023 1314   BUN 39 (H) 11/21/2018 1408   CREATININE 3.03 (H) 11/27/2023 1314   CALCIUM  8.6 (L) 11/27/2023 1314   PROT 6.1 (L) 02/16/2023 1750   ALBUMIN  2.8 (L) 02/16/2023 1750   AST 38 02/16/2023 1750   ALT 33 02/16/2023 1750   ALKPHOS 55 02/16/2023 1750   BILITOT 0.7 02/16/2023 1750   GFRNONAA 15 (L) 11/27/2023 1314   GFRAA 20 (L) 11/21/2018 1408   Lab Results  Component Value Date   CHOL 171 11/09/2020   HDL 60 11/09/2020   LDLCALC 91 11/09/2020   TRIG 113 11/09/2020   CHOLHDL 2.9 11/09/2020   Lab Results  Component Value Date   HGBA1C 7.9 (H) 11/11/2023   Lab Results  Component Value Date   VITAMINB12 481 09/07/2021   Lab Results  Component Value Date   TSH 2.625 09/07/2021     12/13/22 EEG ABNORMALITY - Continuous slow, right posterior temporal region   IMPRESSION: This study is suggestive of cortical dysfunction arising from right posterior temporal region likely secondary to underlying stroke. No seizures or epileptiform discharges were seen throughout the recording.   02/16/23 MRI brain [I reviewed images myself and agree with interpretation. -VRP]  1. Motion limited study without evidence of acute intracranial abnormality. 2. Remote right frontal and right occipital infarcts.    ASSESSMENT AND PLAN  84 y.o. year old female here with:   Dx:  1. Partial symptomatic epilepsy with simple partial seizures, not intractable, without status epilepticus (HCC)     PLAN:  PARTIAL SEIZURES (LEFT ARM AND LEFT LEG CONVULSIONS / MYOCLONUS; since ~ Oct 2024, post-stroke 2022; approx ~2-3 events per month) - continue levetiracetam  500mg  twice a day + extra 500mg  daily after dialysis treatments - continue lacosamide  100mg  twice a day - add gabapentin  100mg  daily  Meds ordered this encounter  Medications   gabapentin  (NEURONTIN ) 100 MG capsule    Sig: Take 1 capsule (100 mg total) by mouth at bedtime.    Dispense:  30 capsule    Refill:  6   Lacosamide  (VIMPAT ) 150 MG TABS    Sig: Take 1 tablet (150 mg total) by mouth  2 (two) times daily.    Dispense:  60 tablet    Refill:  5   levETIRAcetam  (KEPPRA ) 500 MG tablet    Sig: Take 1 tablet (500 mg total) by mouth 2 (two) times daily. Mon wed fri take additional 500 mg after dialysis    Dispense:  270 tablet    Refill:  2   Return in about 1 year (around 02/20/2025) for MyChart visit (15 min).    EDUARD FABIENE HANLON, MD 02/21/2024, 4:23 PM Certified in Neurology, Neurophysiology and Neuroimaging  [facility] [address], [address] [place], [place] [address] [phone]  "

## 2024-02-21 NOTE — Patient Instructions (Addendum)
" °-   continue levetiracetam  500mg  twice a day + extra 500mg  daily after dialysis treatments  - continue lacosamide  100mg  twice a day  - add gabapentin  100mg  daily "

## 2025-02-25 ENCOUNTER — Telehealth: Admitting: Diagnostic Neuroimaging
